# Patient Record
Sex: Female | Born: 1970 | Race: White | Hispanic: No | Smoking: Never smoker
Health system: Southern US, Community
[De-identification: ages and names within clinical notes are randomized; demographics above are authoritative.]

## PROBLEM LIST (undated history)

## (undated) DIAGNOSIS — F419 Anxiety disorder, unspecified: Secondary | ICD-10-CM

## (undated) DIAGNOSIS — F329 Major depressive disorder, single episode, unspecified: Secondary | ICD-10-CM

## (undated) DIAGNOSIS — T7840XA Allergy, unspecified, initial encounter: Secondary | ICD-10-CM

## (undated) DIAGNOSIS — E039 Hypothyroidism, unspecified: Secondary | ICD-10-CM

## (undated) DIAGNOSIS — Z5189 Encounter for other specified aftercare: Secondary | ICD-10-CM

## (undated) DIAGNOSIS — G4730 Sleep apnea, unspecified: Secondary | ICD-10-CM

## (undated) DIAGNOSIS — K2970 Gastritis, unspecified, without bleeding: Secondary | ICD-10-CM

## (undated) DIAGNOSIS — E782 Mixed hyperlipidemia: Secondary | ICD-10-CM

## (undated) DIAGNOSIS — M1990 Unspecified osteoarthritis, unspecified site: Secondary | ICD-10-CM

## (undated) DIAGNOSIS — Z87442 Personal history of urinary calculi: Secondary | ICD-10-CM

## (undated) DIAGNOSIS — D649 Anemia, unspecified: Secondary | ICD-10-CM

## (undated) DIAGNOSIS — G709 Myoneural disorder, unspecified: Secondary | ICD-10-CM

## (undated) DIAGNOSIS — G894 Chronic pain syndrome: Secondary | ICD-10-CM

## (undated) DIAGNOSIS — D759 Disease of blood and blood-forming organs, unspecified: Secondary | ICD-10-CM

## (undated) DIAGNOSIS — K219 Gastro-esophageal reflux disease without esophagitis: Secondary | ICD-10-CM

## (undated) DIAGNOSIS — J189 Pneumonia, unspecified organism: Secondary | ICD-10-CM

## (undated) DIAGNOSIS — K7460 Unspecified cirrhosis of liver: Secondary | ICD-10-CM

## (undated) DIAGNOSIS — Z1501 Genetic susceptibility to malignant neoplasm of breast: Secondary | ICD-10-CM

## (undated) DIAGNOSIS — M810 Age-related osteoporosis without current pathological fracture: Secondary | ICD-10-CM

## (undated) DIAGNOSIS — T8859XA Other complications of anesthesia, initial encounter: Secondary | ICD-10-CM

## (undated) DIAGNOSIS — Z1502 Genetic susceptibility to malignant neoplasm of ovary: Secondary | ICD-10-CM

## (undated) DIAGNOSIS — Z1509 Genetic susceptibility to other malignant neoplasm: Secondary | ICD-10-CM

## (undated) DIAGNOSIS — F32A Depression, unspecified: Secondary | ICD-10-CM

## (undated) DIAGNOSIS — D6949 Other primary thrombocytopenia: Secondary | ICD-10-CM

## (undated) DIAGNOSIS — C50311 Malignant neoplasm of lower-inner quadrant of right female breast: Secondary | ICD-10-CM

## (undated) DIAGNOSIS — C50111 Malignant neoplasm of central portion of right female breast: Secondary | ICD-10-CM

## (undated) DIAGNOSIS — H269 Unspecified cataract: Secondary | ICD-10-CM

## (undated) DIAGNOSIS — I10 Essential (primary) hypertension: Secondary | ICD-10-CM

## (undated) DIAGNOSIS — Z15060 Genetic susceptibility to colorectal cancer: Secondary | ICD-10-CM

## (undated) DIAGNOSIS — E119 Type 2 diabetes mellitus without complications: Secondary | ICD-10-CM

## (undated) HISTORY — DX: Genetic susceptibility to malignant neoplasm of breast: Z15.01

## (undated) HISTORY — DX: Gastritis, unspecified, without bleeding: K29.70

## (undated) HISTORY — DX: Anemia, unspecified: D64.9

## (undated) HISTORY — PX: OTHER SURGICAL HISTORY: SHX169

## (undated) HISTORY — PX: BREAST SURGERY: SHX581

## (undated) HISTORY — PX: SPINE SURGERY: SHX786

## (undated) HISTORY — DX: Malignant neoplasm of central portion of right female breast: C50.111

## (undated) HISTORY — DX: Genetic susceptibility to malignant neoplasm of breast: Z15.09

## (undated) HISTORY — DX: Other primary thrombocytopenia: D69.49

## (undated) HISTORY — DX: Genetic susceptibility to malignant neoplasm of ovary: Z15.02

## (undated) HISTORY — DX: Mixed hyperlipidemia: E78.2

## (undated) HISTORY — DX: Age-related osteoporosis without current pathological fracture: M81.0

## (undated) HISTORY — DX: Chronic pain syndrome: G89.4

## (undated) HISTORY — PX: BACK SURGERY: SHX140

## (undated) HISTORY — PX: CHOLECYSTECTOMY: SHX55

## (undated) HISTORY — DX: Unspecified cataract: H26.9

## (undated) HISTORY — DX: Malignant neoplasm of lower-inner quadrant of right female breast: C50.311

## (undated) HISTORY — DX: Encounter for other specified aftercare: Z51.89

## (undated) HISTORY — DX: Genetic susceptibility to colorectal cancer: Z15.060

## (undated) HISTORY — PX: APPENDECTOMY: SHX54

## (undated) HISTORY — DX: Allergy, unspecified, initial encounter: T78.40XA

## (undated) MED FILL — Paclitaxel IV Conc 300 MG/50ML (6 MG/ML): INTRAVENOUS | Qty: 22 | Status: AC

## (undated) MED FILL — Fosaprepitant Dimeglumine For IV Infusion 150 MG (Base Eq): INTRAVENOUS | Qty: 5 | Status: AC

---

## 1998-01-13 ENCOUNTER — Ambulatory Visit (HOSPITAL_COMMUNITY): Admission: RE | Admit: 1998-01-13 | Discharge: 1998-01-13 | Payer: Self-pay | Admitting: Unknown Physician Specialty

## 1998-06-04 ENCOUNTER — Other Ambulatory Visit: Admission: RE | Admit: 1998-06-04 | Discharge: 1998-06-04 | Payer: Self-pay | Admitting: *Deleted

## 1999-05-13 ENCOUNTER — Other Ambulatory Visit: Admission: RE | Admit: 1999-05-13 | Discharge: 1999-05-13 | Payer: Self-pay | Admitting: *Deleted

## 1999-06-10 ENCOUNTER — Encounter: Payer: Self-pay | Admitting: Family Medicine

## 1999-06-10 ENCOUNTER — Ambulatory Visit (HOSPITAL_COMMUNITY): Admission: RE | Admit: 1999-06-10 | Discharge: 1999-06-10 | Payer: Self-pay | Admitting: Family Medicine

## 2000-11-27 ENCOUNTER — Other Ambulatory Visit: Admission: RE | Admit: 2000-11-27 | Discharge: 2000-11-27 | Payer: Self-pay | Admitting: Internal Medicine

## 2004-03-05 ENCOUNTER — Encounter: Admission: RE | Admit: 2004-03-05 | Discharge: 2004-03-05 | Payer: Self-pay | Admitting: Neurological Surgery

## 2004-04-07 ENCOUNTER — Ambulatory Visit (HOSPITAL_COMMUNITY): Admission: RE | Admit: 2004-04-07 | Discharge: 2004-04-08 | Payer: Self-pay | Admitting: Neurological Surgery

## 2004-04-21 ENCOUNTER — Inpatient Hospital Stay (HOSPITAL_COMMUNITY): Admission: RE | Admit: 2004-04-21 | Discharge: 2004-04-24 | Payer: Self-pay | Admitting: Neurological Surgery

## 2004-10-18 ENCOUNTER — Encounter: Admission: RE | Admit: 2004-10-18 | Discharge: 2004-10-18 | Payer: Self-pay | Admitting: Neurological Surgery

## 2005-06-16 ENCOUNTER — Encounter: Admission: RE | Admit: 2005-06-16 | Discharge: 2005-06-16 | Payer: Self-pay | Admitting: Neurological Surgery

## 2006-03-07 ENCOUNTER — Emergency Department (HOSPITAL_COMMUNITY): Admission: EM | Admit: 2006-03-07 | Discharge: 2006-03-07 | Payer: Self-pay | Admitting: Emergency Medicine

## 2006-03-07 ENCOUNTER — Ambulatory Visit: Payer: Self-pay | Admitting: *Deleted

## 2006-06-20 ENCOUNTER — Emergency Department (HOSPITAL_COMMUNITY): Admission: EM | Admit: 2006-06-20 | Discharge: 2006-06-20 | Payer: Self-pay | Admitting: Emergency Medicine

## 2007-08-09 IMAGING — CT CT L SPINE W/O CM
3 series · 15 of 33 positions shown, 18 images · IV contrast (agent unspecified)
Comparison: 03/05/04.

Addendum Begins
Please disregard this addendum.  Original report is unchanged.

Addendum Ends
CLINICAL DATA: Neck and back pain.  
LUMBAR SPINE CT WITHOUT CONTRAST:
TECHNIQUE: Multidetector CT imaging of the lumbar spine was performed.  Multiplanar CT image reconstructions were also generated.

[Series 4: recon 3: l-spine helical · axial · 0.27mm/px · z∈[-75,+72]mm · 7 of 139 slices shown, 9 images]
[im 11/139  soft-tissue]
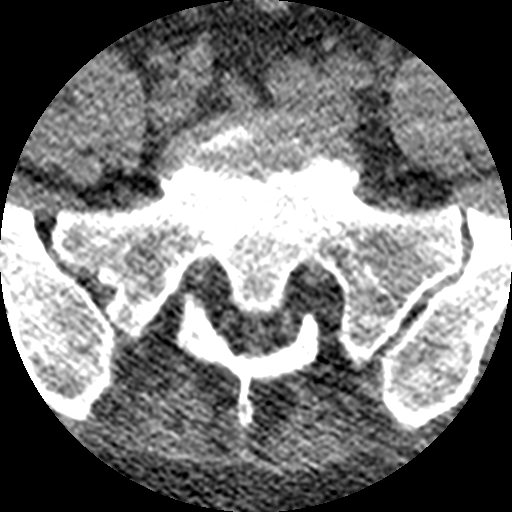
[im 11/139  bone]
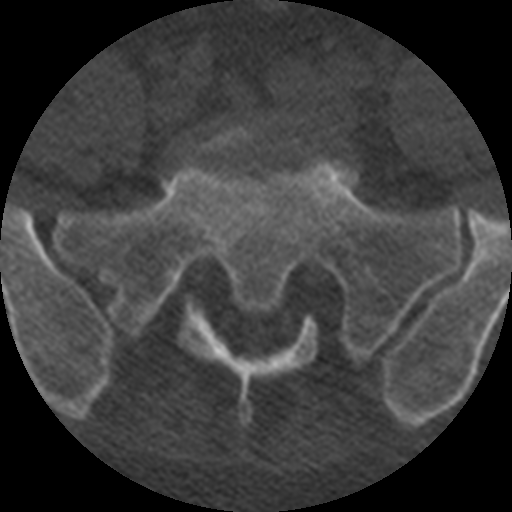
[im 32/139  bone]
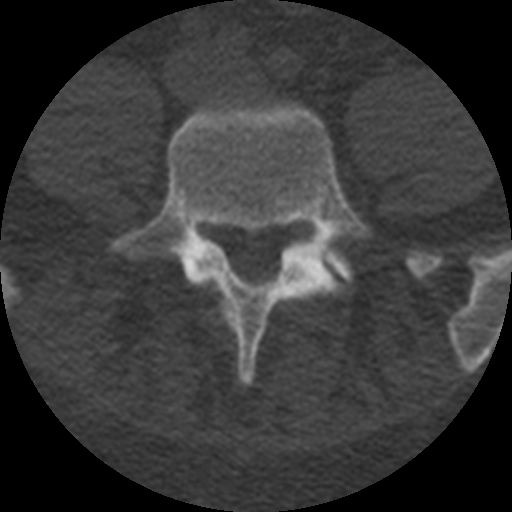
[im 54/139  bone]
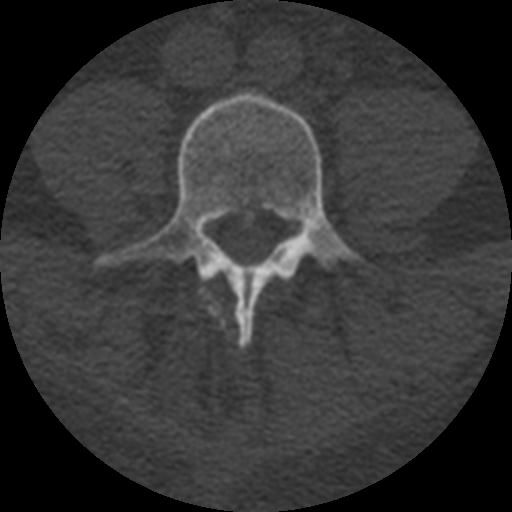
[im 75/139  bone]
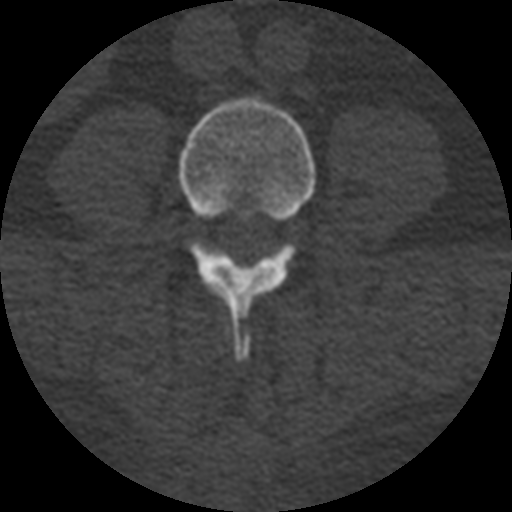
[im 85/139  soft-tissue]
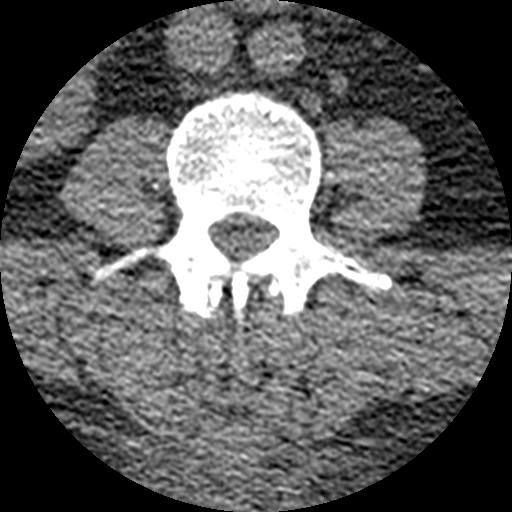
[im 85/139  bone]
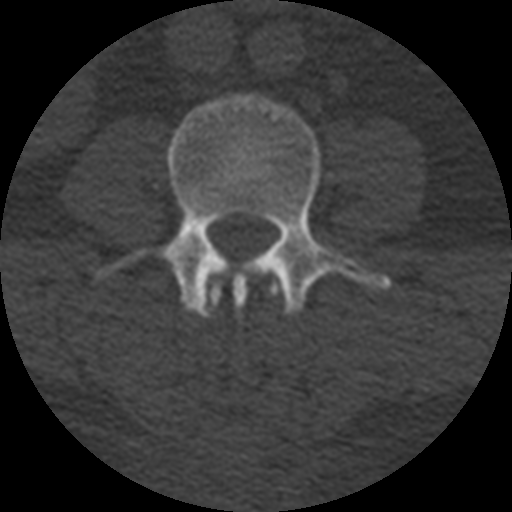
[im 107/139  bone]
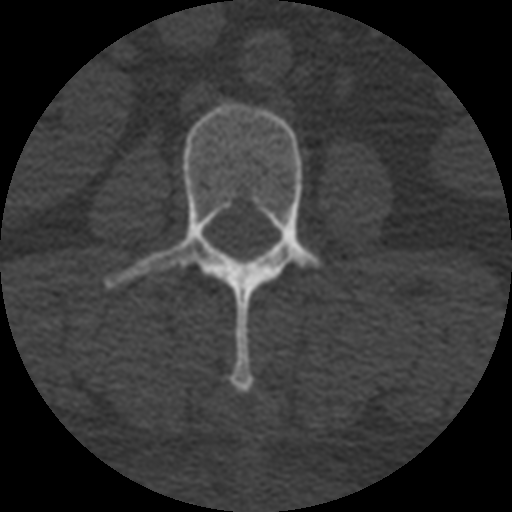
[im 128/139  bone]
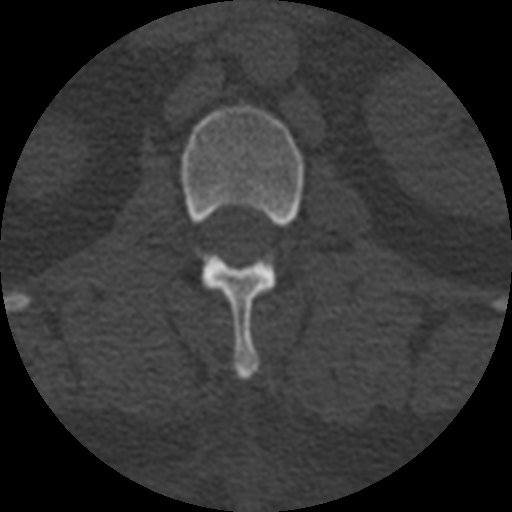

[Series 400: reformatted · sagittal · 0.35mm/px · 5 of 40 slices shown, 6 images (1 of 2)]
[im 14/40  bone]
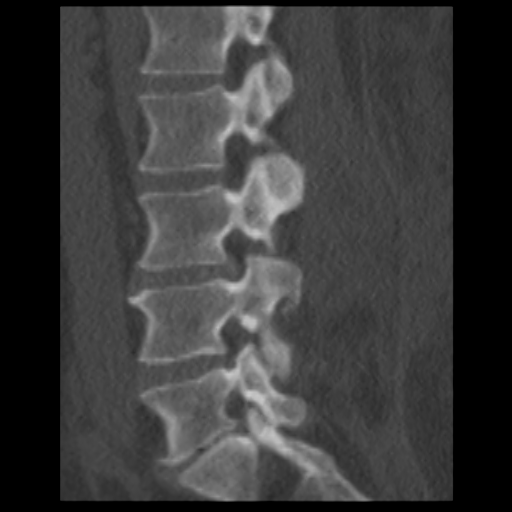
[im 17/40  bone]
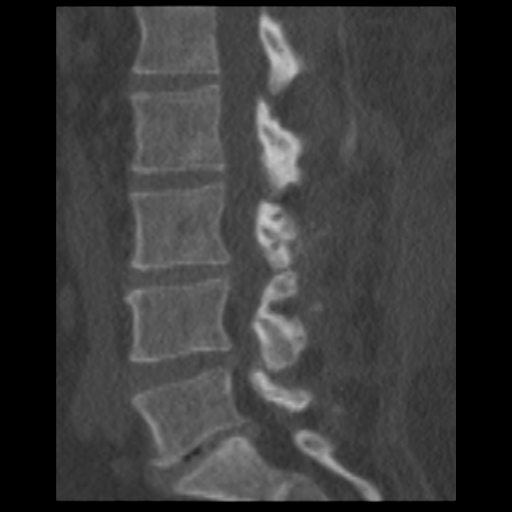
[im 20/40  soft-tissue]
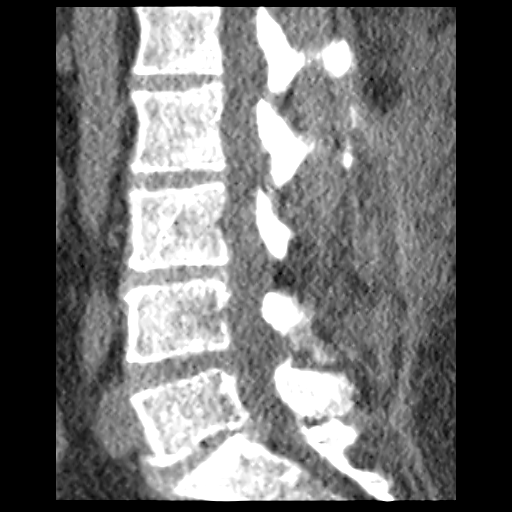
[im 20/40  bone]
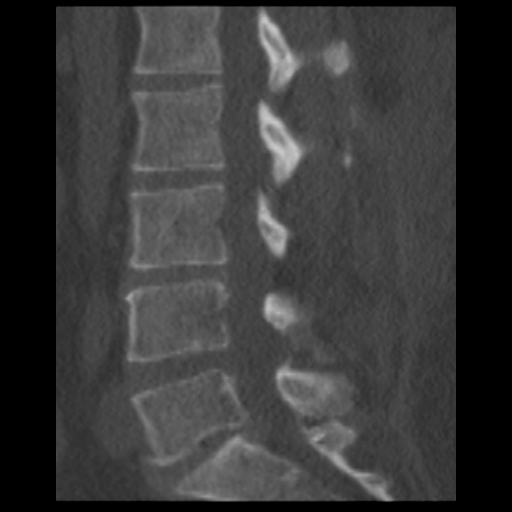
[im 23/40  bone]
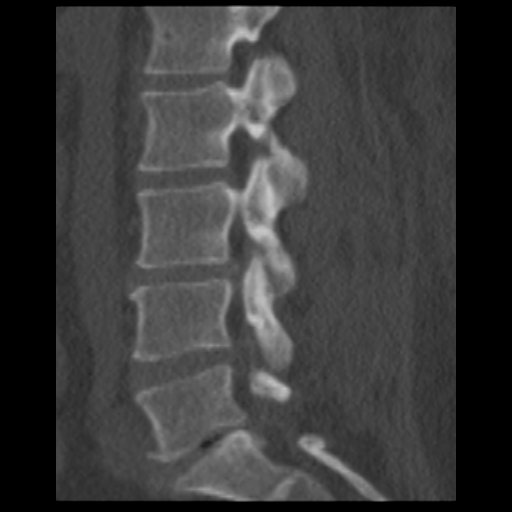
[im 27/40  bone]
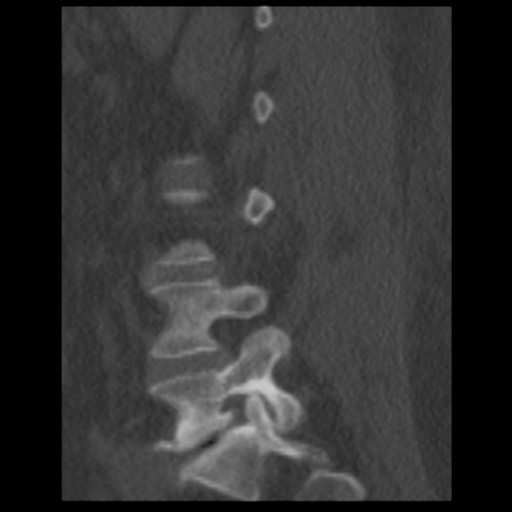

[Series 401: reformatted · coronal · 0.35mm/px · 3 of 40 slices shown (2 of 2)]
[im 8/40  bone]
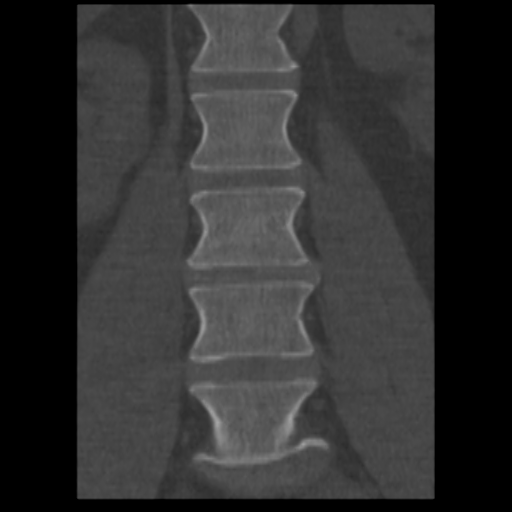
[im 16/40  bone]
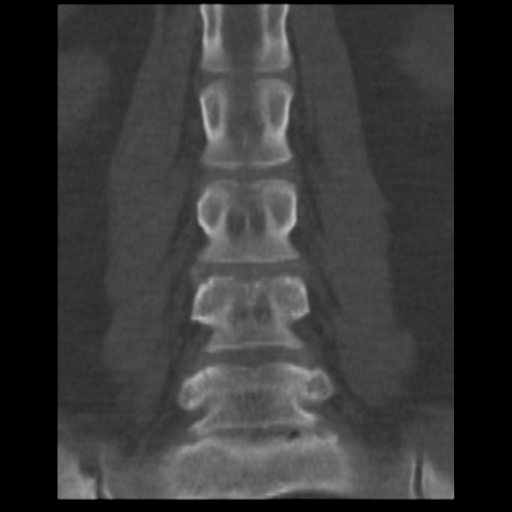
[im 24/40  bone]
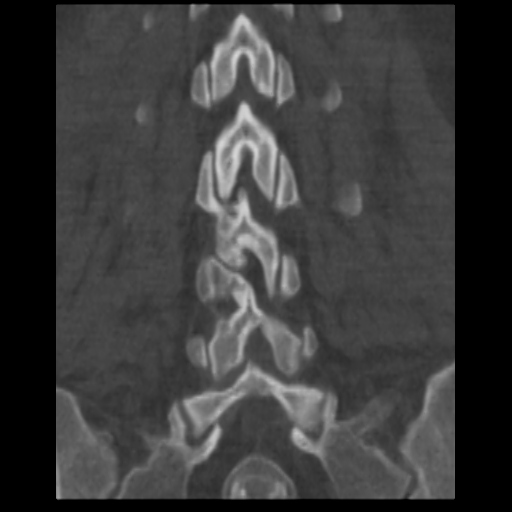

[15 of 33 positions shown; findings below may reference images not displayed]

FINDINGS: The patient has had left laminectomy at the L-3, L-4, and L-5 levels.  The facet joint on the right at L3-4 has an abnormal horizontal alignment, consistent with pars defect.  There is adjacent bony prominency likely from prior fusion.  
t L3-4 there was previously a broad based disc bulge.  On today?s exam there is a vague suggestion of a disc bulge best seen in the region of the neural foramina.  I do not believe that this is causing significant stenosis although it could possibly abut the right L-3 nerve in the lateral extraforaminal space. 
L4-5:  There is a suggestion of an extradural desity likely from epidural fibrosis given the history of intradiscal diskectomy.  The tissue contrast between the disc and the thecal sac is not sufficient to be highly specific.  There is a suggestion of subarticular lateral recess stenosis bilaterally.
L5-S1:  There appears to be soft tissue density posterior to the intervertebral level.  Given the history of intradiscal diskectomy, this may simply represent epidural fibrosis.  Correlate with expected degree of totality of diskectomy in assessing whether this could represent recurrent disk material. There is borderline left foraminal stenosis due to foraminal spurring extending from the end plate of L-5.  Vacuum disc phenomenon is present at this level.  No subluxation is identified.
IMPRESSION: Tissue contrast between the thecal sac and the intervertebral discs is poor.  However, there is a sense of a extradural soft tissue density at the L5-S1 level similar to that noted previously, although given the interval interdiscal diskectomy this may simply represent epidural fibrosis.  There is probably subarticular lateral recess stenosis at L4-5, and borderline left foramenal narrowing at L5-S1.

## 2010-02-14 ENCOUNTER — Encounter: Payer: Self-pay | Admitting: Neurological Surgery

## 2010-06-11 NOTE — Discharge Summary (Signed)
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   PROCEDURES:  Decompressive lumbar laminectomy L3-L4, L4-L5 and L5-S1 with  non instrumented onlay fusion.   BRIEF HISTORY OF PRESENT ILLNESS:  Ms. Gholson is a 40 year old white female  who presented to the neurosurgery clinic with complaints of back pain with  bilateral leg pain.  She had undergone a couple of previous surgeries  performed by another physician.  She had an MRI which showed significant  spondylosis with stenosis at L3-4, L4-L5 and L5-S1.  I recommended a repeat  decompressive laminectomy and diskectomy followed by non-instrumented  fusion.  She understood the risks, benefits and expected outcome of the  procedure and wished to proceed.   HOSPITAL COURSE:  The patient was admitted on April 21, 2004 and taken to  the operating room where she underwent a decompressive laminectomy at L3-4,  L4-L5 and L5-S1 followed by non-instrumented onlay fusion.  The patient  tolerated the procedure well, was taken to the recovery room and admitted to  the floor in stable condition.  For details of the operative procedure,  please see the dictated operative note.  The patient's hospital course was  routine.  There were no complications.  She did have appropriate incisional  soreness after the surgery.  Her leg pain was better.  She had no  significant left leg pain or numbness, tingling or weakness.  She was  ambulating well.  She had some postoperative urinary retention and had a  Foley catheter placed.  It was removed on April 1 and she  continued to be  able to urinate well on her own.  Her pain was well controlled.  She was  ambulating with a walker.  Her incision was clean, dry and intact.  She  continued to do well and was discharged to home in stable condition on April 24, 2004 with plans to follow with Dr. Ronnald Ramp in 2 weeks.   DISCHARGE DIAGNOSES:  Lumbar spondylosis with stenosis status post  decompressive laminectomy and non-instrumented onlay fusion.      DSJ/MEDQ  D:  06/11/2004  T:  06/11/2004  Job:  [id number]

## 2010-06-11 NOTE — Op Note (Signed)
Linda [name], Linda [name]                 ACCOUNT NO.:  [id number]   MEDICAL RECORD NO.:  [id number]          PATIENT TYPE:  INP   LOCATION:  2861                         FACILITY:  [facility]   PHYSICIAN:  Eustace Moore, MD     DATE OF BIRTH:  May 31, 1970   DATE OF PROCEDURE:  04/21/2004  DATE OF DISCHARGE:                                 OPERATIVE REPORT   PREOPERATIVE DIAGNOSIS:  Lumbar spondylosis, L3 to L5 with degenerative disk  disease, L3-4, L4-5, L5-S1 with lumbar disk herniation, L4-5, L5-S1 with  back and left leg pain and failed back syndrome.   POSTOPERATIVE DIAGNOSIS:  Lumbar spondylosis, L3 to L5 with degenerative  disk disease, L3-4, L4-5, L5-S1 with lumbar disk herniation, L4-5, L5-S1  with back and left leg pain and failed back syndrome.   OPERATION PERFORMED:  1.  Redo hemilaminectomy, medial facetectomy and foraminotomy at L3-4 on the      left and at L5-S1 on the left with a redo diskectomy at L5-S1 on the      left side utilizing microscopic dissection.  We also did a      hemilaminectomy and medial facetectomy with foraminotomy at L4-5 on the      left with microdiskectomy at L4-5 on the left side.  A sublaminar was      performed at L4-5 also.  2.  Harvesting of iliac crest bone marrow aspirate through a separate      fascial incision.  3.  Posterolateral arthrodesis, L3 to S1 on the right utilizing locally      harvested, morcellized autologous bone graft and Isotis soaked with bone      marrow aspirate.  4.  Microdissection.   SURGEON:  Eustace Moore, MD   ASSISTANT:  Kary Kos, M.D.   ANESTHESIA:  General endotracheal.   COMPLICATIONS:  None apparent.   INDICATIONS FOR PROCEDURE:  [name] is a 40 year old white female who had  undergone two previous back surgeries, one at L3-4 and one at L5-S1 by  another physician.  She had chronic back pain with left leg pain and right  buttocks pain following the surgery.  She had tried medical management for  quite  some time without significant relief. She had an MRI and a CT  myelogram which showed continued spondylosis at L3-4, L4-5 and L5-S1 with  degenerative disk disease at these levels and recurrent disk herniations at  L5-S1 and L3-4.  I recommended a lumbar re-exploration with repeat  decompression with noninstrumented fusion.  We wanted to avoid a large three  level posterior lumbar interbody fusion procedure because of her youth.  I  thought this would leave her with significant back  pain and a very long  recovery time.  She understood the risks, benefits and expected outcome of  this procedure and wished to proceed.   DESCRIPTION OF PROCEDURE:  The patient was taken to the operating room and  after induction of adequate general endotracheal anesthesia, was rolled into  the prone position over the Wilson frame and all pressure points were  padded.  The  lumbar region was prepped with DuraPrep and then draped in the  usual sterile fashion.  10 mL of local anesthesia was injected and then her  old incisions were ellipsed out.  I then took the incision down to the  lumbosacral fascia.  This was opened and the paraspinous musculature was  taken down in a subperiosteal fashion to expose L3 to S1. Intraoperative x-  ray confirmed our level and then I used the Kerrison punch to performed a  repeat hemilaminectomy, medial facetectomy and foraminotomy at L5-S1 on the  left side.  There was significant scar tissue around the left S1 nerve root.  I teased this away and decompressed the nerve root and detethered it from  the disk space itself.  I then went to the L4-5 level and performed a  hemilaminectomy and medial facetectomy and foraminotomy at L4-5 on the left.  I removed the yellow ligament, identified the underlying L5 nerve root.  I  decompressed those out to the medial pedicle wall and decompressed the  lateral recess.  I then drilled up under the spinous process and performed a  sublaminar  decompression on this side, decompressing the lateral recess on  the patient's right side.  I then went to L3-4 and performed a redo  hemilaminectomy, medial facetectomy and foraminotomy at L3-4 on the left  side.  I then inspected the nerve roots once again, and made sure they were  well decompressed.  We then went to the right iliac crest where I made a  separate fascial incision and passed a large Healos needle into the iliac  crest and removed about 15 mL of bone marrow aspirate.  I then waxed the  hole and closed the separate fascial incision with interrupted 31 Vicryl.  We then soaked the Isotis allograft with the bone marrow aspirate and mixed  this with our locally harvested morcellized autologous bone graft.  We then  brought in the operating microscope and retracted the dura medially at L3-4  and inspected the disk.  We did not feel this was a significant disk  herniation and therefore decided not to incise this disk and perform a  diskectomy.  At L4-5 and at L5-S1, though, we felt that the disk herniations  were more significant.  We incised each disk at those two levels and  performed thorough intradiskal diskectomies with pituitary rongeurs at these  levels.  Once decompression was complete, we palpated with a nerve hook and  a coronary dilator to assure adequate decompression in the  midline and the  nerve root.  We then irrigated with saline solution containing bacitracin.  We then decorticated the L3, L4, L5 and S1 lamina and placed a mixture of  the Isotis with bone marrow aspirate and local autograft out over this for  posterolateral arthrodesis at L3 to S1 on the right side inclusive.  We then  placed a medium Hemovac drain through a separate stab incision, dried all  bleeding points and then closed the fascia with interrupted #1 Vicryl,  closed the subcutaneous and subcuticular tissues with 2-0 and 3-0 Vicryl and closed the skin with benzoin and Steri-Strips.  The drapes  were removed.  A  sterile dressing was applied.  The patient was awakened from general  anesthesia and transported to the recovery room in stable condition.  At the  end of the procedure, all sponge, needle and instrument counts were correct.      DSJ/MEDQ  D:  04/21/2004  T:  04/21/2004  Job:  [id number]

## 2010-11-05 ENCOUNTER — Institutional Professional Consult (permissible substitution): Payer: Self-pay | Admitting: Pulmonary Disease

## 2010-12-03 ENCOUNTER — Institutional Professional Consult (permissible substitution): Payer: Self-pay | Admitting: Pulmonary Disease

## 2011-01-07 ENCOUNTER — Institutional Professional Consult (permissible substitution): Payer: Self-pay | Admitting: Pulmonary Disease

## 2011-02-04 ENCOUNTER — Institutional Professional Consult (permissible substitution): Payer: Self-pay | Admitting: Pulmonary Disease

## 2011-06-10 ENCOUNTER — Institutional Professional Consult (permissible substitution): Payer: Self-pay | Admitting: Pulmonary Disease

## 2011-07-08 ENCOUNTER — Institutional Professional Consult (permissible substitution): Payer: Self-pay | Admitting: Pulmonary Disease

## 2016-02-19 DIAGNOSIS — K219 Gastro-esophageal reflux disease without esophagitis: Secondary | ICD-10-CM | POA: Diagnosis not present

## 2016-02-19 DIAGNOSIS — F321 Major depressive disorder, single episode, moderate: Secondary | ICD-10-CM | POA: Diagnosis not present

## 2016-02-19 DIAGNOSIS — E1142 Type 2 diabetes mellitus with diabetic polyneuropathy: Secondary | ICD-10-CM | POA: Diagnosis not present

## 2016-02-19 DIAGNOSIS — E559 Vitamin D deficiency, unspecified: Secondary | ICD-10-CM | POA: Diagnosis not present

## 2016-02-19 DIAGNOSIS — M545 Low back pain: Secondary | ICD-10-CM | POA: Diagnosis not present

## 2016-02-19 DIAGNOSIS — E1121 Type 2 diabetes mellitus with diabetic nephropathy: Secondary | ICD-10-CM | POA: Diagnosis not present

## 2016-02-19 DIAGNOSIS — I10 Essential (primary) hypertension: Secondary | ICD-10-CM | POA: Diagnosis not present

## 2016-02-19 DIAGNOSIS — D649 Anemia, unspecified: Secondary | ICD-10-CM | POA: Diagnosis not present

## 2016-02-19 DIAGNOSIS — E782 Mixed hyperlipidemia: Secondary | ICD-10-CM | POA: Diagnosis not present

## 2016-03-04 DIAGNOSIS — D649 Anemia, unspecified: Secondary | ICD-10-CM | POA: Diagnosis not present

## 2016-03-23 DIAGNOSIS — Z803 Family history of malignant neoplasm of breast: Secondary | ICD-10-CM | POA: Diagnosis not present

## 2016-03-23 DIAGNOSIS — Z8051 Family history of malignant neoplasm of kidney: Secondary | ICD-10-CM | POA: Diagnosis not present

## 2016-03-23 DIAGNOSIS — D696 Thrombocytopenia, unspecified: Secondary | ICD-10-CM | POA: Diagnosis not present

## 2016-03-23 DIAGNOSIS — D509 Iron deficiency anemia, unspecified: Secondary | ICD-10-CM | POA: Diagnosis not present

## 2016-04-20 DIAGNOSIS — D649 Anemia, unspecified: Secondary | ICD-10-CM | POA: Diagnosis not present

## 2016-05-11 DIAGNOSIS — D649 Anemia, unspecified: Secondary | ICD-10-CM | POA: Diagnosis not present

## 2016-05-17 ENCOUNTER — Telehealth: Payer: Self-pay | Admitting: *Deleted

## 2016-05-17 NOTE — Telephone Encounter (Signed)
I have called and left a message on the machine for the patient to call the office back. Patient needs a new paitent appt.

## 2016-05-18 ENCOUNTER — Telehealth: Payer: Self-pay | Admitting: *Deleted

## 2016-05-18 NOTE — Telephone Encounter (Signed)
I have called and spoke with the patient. I have given the patient the date/time of May 18th at 8:30am, to arrive at 8:15am.

## 2016-05-24 DIAGNOSIS — D500 Iron deficiency anemia secondary to blood loss (chronic): Secondary | ICD-10-CM | POA: Diagnosis not present

## 2016-05-27 DIAGNOSIS — D252 Subserosal leiomyoma of uterus: Secondary | ICD-10-CM | POA: Diagnosis not present

## 2016-05-27 DIAGNOSIS — Z1502 Genetic susceptibility to malignant neoplasm of ovary: Secondary | ICD-10-CM | POA: Diagnosis not present

## 2016-05-27 DIAGNOSIS — Z1501 Genetic susceptibility to malignant neoplasm of breast: Secondary | ICD-10-CM | POA: Diagnosis not present

## 2016-05-27 DIAGNOSIS — Z1231 Encounter for screening mammogram for malignant neoplasm of breast: Secondary | ICD-10-CM | POA: Diagnosis not present

## 2016-05-31 DIAGNOSIS — D500 Iron deficiency anemia secondary to blood loss (chronic): Secondary | ICD-10-CM | POA: Diagnosis not present

## 2016-05-31 DIAGNOSIS — Z1501 Genetic susceptibility to malignant neoplasm of breast: Secondary | ICD-10-CM | POA: Diagnosis not present

## 2016-05-31 DIAGNOSIS — Z1502 Genetic susceptibility to malignant neoplasm of ovary: Secondary | ICD-10-CM | POA: Diagnosis not present

## 2016-06-02 DIAGNOSIS — C50919 Malignant neoplasm of unspecified site of unspecified female breast: Secondary | ICD-10-CM | POA: Diagnosis not present

## 2016-06-02 DIAGNOSIS — Z1501 Genetic susceptibility to malignant neoplasm of breast: Secondary | ICD-10-CM | POA: Diagnosis not present

## 2016-06-06 ENCOUNTER — Other Ambulatory Visit: Payer: Self-pay | Admitting: Surgery

## 2016-06-06 DIAGNOSIS — Z1501 Genetic susceptibility to malignant neoplasm of breast: Secondary | ICD-10-CM

## 2016-06-06 DIAGNOSIS — Z1509 Genetic susceptibility to other malignant neoplasm: Principal | ICD-10-CM

## 2016-06-08 DIAGNOSIS — K2961 Other gastritis with bleeding: Secondary | ICD-10-CM | POA: Diagnosis not present

## 2016-06-08 DIAGNOSIS — D500 Iron deficiency anemia secondary to blood loss (chronic): Secondary | ICD-10-CM | POA: Diagnosis not present

## 2016-06-08 DIAGNOSIS — K3189 Other diseases of stomach and duodenum: Secondary | ICD-10-CM | POA: Diagnosis not present

## 2016-06-08 DIAGNOSIS — K219 Gastro-esophageal reflux disease without esophagitis: Secondary | ICD-10-CM | POA: Diagnosis not present

## 2016-06-08 LAB — HM COLONOSCOPY

## 2016-06-10 ENCOUNTER — Ambulatory Visit: Payer: PPO | Attending: Gynecology | Admitting: Gynecology

## 2016-06-10 ENCOUNTER — Inpatient Hospital Stay (HOSPITAL_COMMUNITY)
Admission: RE | Admit: 2016-06-10 | Discharge: 2016-06-10 | Disposition: A | Discharge status: Home / Self Care | Payer: PPO | Source: Ambulatory Visit

## 2016-06-10 ENCOUNTER — Encounter: Payer: Self-pay | Admitting: Gynecology

## 2016-06-10 VITALS — BP 161/85 | HR 92 | Temp 98.2°F | Resp 20 | Ht 63.15 in | Wt 232.8 lb

## 2016-06-10 DIAGNOSIS — E119 Type 2 diabetes mellitus without complications: Secondary | ICD-10-CM | POA: Diagnosis not present

## 2016-06-10 DIAGNOSIS — Z1502 Genetic susceptibility to malignant neoplasm of ovary: Secondary | ICD-10-CM | POA: Diagnosis not present

## 2016-06-10 DIAGNOSIS — E669 Obesity, unspecified: Secondary | ICD-10-CM | POA: Diagnosis not present

## 2016-06-10 DIAGNOSIS — Z803 Family history of malignant neoplasm of breast: Secondary | ICD-10-CM | POA: Diagnosis not present

## 2016-06-10 DIAGNOSIS — Z1501 Genetic susceptibility to malignant neoplasm of breast: Secondary | ICD-10-CM | POA: Diagnosis not present

## 2016-06-10 DIAGNOSIS — Z1509 Genetic susceptibility to other malignant neoplasm: Secondary | ICD-10-CM | POA: Insufficient documentation

## 2016-06-10 NOTE — Progress Notes (Signed)
Consult Note: Gyn-Onc   Linda [name] 46 y.o. female  Chief Complaint  Patient presents with  . BRCA 1 Positive    Assessment :46-year-old white single female gravida 0 with a BRCA 1 mutation desiring risk reducing hysterectomy and bilateral salpingo-oophorectomy.  Plan: I had a good discussion with the patient and her stepmother regarding the implications of the BRCA 1 mutation. They're well aware that she is at significantly increased risk for both breast and ovarian cancer. They understand that time for screening test for ovarian cancer and therefore we would recommend salpingo-oophorectomy. Further, given the BRCA1 mutation she is also at high risk to develop papillary serous carcinoma of the endometrium. The patient wishes to proceed with laparoscopic hysterectomy and bilateral salpingo-oophorectomy. She understands the risks of surgery including hemorrhage, infection, injury to adjacent viscera, anesthetic risks, and thrombolic complications. She has other medical problems such as obesity and type 2 diabetes which may also increase the risk of surgery. Considering all of this patient does wish to go ahead with surgery as soon as possible and we will schedule it for next Tuesday. Patient understands and Dr. Paula Gehrig will be her surgeon and will meet Dr. Gehrig that morning. The postoperative recovery course was also reviewed with the patient and her stepmother. All other questions are answered.  Patient has not had a Pap smear nor years of Paps or is performed today.     HPI: 46-year-old white single female seen in consultation the request of Dr. Kelly Mosher who, because of a family history of breast cancer, has been tested for genetic mutations and was found to have a BRCA1 mutation. The patient denies any past gynecologic history. She went through menopause at age 39. She's not been on any hormone replacement therapy. She's never been pregnant. She has not had a Pap smear in separate  years. She denies any gynecologic symptoms including pelvic pain or pressure. She has no abdominal symptoms such as early satiety, bloating, distention, or pressure. Patient indicates that she has had a ultrasound of the pelvis which was normal although those records are not available.  Patient has a number of medical problems noted below. She has had an appendectomy and laparoscopic  cholecystectomy. Review of Systems:10 point review of systems is negative except as noted in interval history.   Vitals: Blood pressure (!) 161/85, pulse 92, temperature 98.2 F (36.8 C), temperature source Oral, resp. rate 20, height 5' 3.15" (1.604 m), weight 232 lb 12.8 oz (105.6 kg).  Physical Exam: General : The patient is a healthy woman in no acute distress.  HEENT: normocephalic, extraoccular movements normal; neck is supple without thyromegally  Lynphnodes: Supraclavicular and inguinal nodes not enlarged  Abdomen:  Obese,Soft, non-tender, no ascites, no organomegally, no masses, no hernias  Pelvic:  EGBUS: Normal female  Vagina: Normal, no lesions  Urethra and Bladder: Normal, non-tender  Cervix: appears nulliparous. Uterus: difficult to evaluate secondary to the patient's habitus.   Bi-manual examination: Non-tender; no adenxal masses or nodularity  Rectal: normal sphincter tone, no masses, no blood  Lower extremities: No edema or varicosities. Normal range of motion      Allergies  Allergen Reactions  . Celecoxib Other (See Comments)    ask  . Codeine Other (See Comments)    ask  . Ezetimibe-Simvastatin Other (See Comments)    ask  . Propranolol Hcl Other (See Comments)    ask    No past medical history on file.  No past surgical history on   file.  Current Outpatient Prescriptions  Medication Sig Dispense Refill  . pravastatin (PRAVACHOL) 20 MG tablet     . buPROPion (WELLBUTRIN XL) 300 MG 24 hr tablet Take 300 mg by mouth.    . clotrimazole-betamethasone (LOTRISONE) cream Apply  topically.    . Dulaglutide 1.5 MG/0.5ML SOPN Inject 1.5 mg into the skin.    . famotidine (PEPCID) 20 MG tablet Take 20 mg by mouth.    . FETZIMA 80 MG CP24 Take 1 capsule by mouth daily.  0  . gabapentin (NEURONTIN) 300 MG capsule Take 300 mg by mouth.    . LANTUS SOLOSTAR 100 UNIT/ML Solostar Pen INJECT 68 UNITS ONCE DAILY  3  . levothyroxine (SYNTHROID, LEVOTHROID) 75 MCG tablet Take by mouth.    . lidocaine (XYLOCAINE) 2 % solution 15 mLs.    . lisinopril (PRINIVIL,ZESTRIL) 10 MG tablet Take 10 mg by mouth.    . losartan (COZAAR) 25 MG tablet Take 25 mg by mouth.    . metFORMIN (GLUCOPHAGE) 1000 MG tablet Take 1,000 mg by mouth.    . methocarbamol (ROBAXIN) 500 MG tablet Take 500 mg by mouth.    . morphine (KADIAN) 10 MG 24 hr capsule Take 10 mg by mouth.    . morphine (MSIR) 15 MG tablet TAKE ONE TABLET BY MOUTH AS NEEDED FOR SEVERE breakthrough pain  0  . pantoprazole (PROTONIX) 40 MG tablet Take 40 mg by mouth.    . potassium chloride (MICRO-K) 10 MEQ CR capsule Take by mouth.    . prochlorperazine (COMPAZINE) 10 MG tablet Take 10 mg by mouth.    . sucralfate (CARAFATE) 1 g tablet TAKE ONE TABLET BY MOUTH FOUR TIMES DAILY 30 MINUTES BEFORE MEALS AND AT BEDTIME  12  . Vitamin D, Ergocalciferol, (DRISDOL) 50000 units CAPS capsule Take by mouth.    . zolpidem (AMBIEN) 10 MG tablet Take 10 mg by mouth.     No current facility-administered medications for this visit.     Social History   Social History  . Marital status: Married    Spouse name: N/A  . Number of children: N/A  . Years of education: N/A   Occupational History  . Not on file.   Social History Main Topics  . Smoking status: Not on file  . Smokeless tobacco: Not on file  . Alcohol use Not on file  . Drug use: Unknown  . Sexual activity: Not on file   Other Topics Concern  . Not on file   Social History Narrative  . No narrative on file    No family history on file.    Linda Lazenby Clarke-Pearson,  MD 06/10/2016, 8:45 AM       

## 2016-06-10 NOTE — Patient Instructions (Addendum)
Linda [name]  06/10/2016   Your procedure is scheduled on: 06-14-16  Report to [facility]  elevators to 3rd floor to  [facility] at 845 AM.  Call this number if you have problems the morning of surgery [phone]   Remember: ONLY 1 PERSON MAY GO WITH YOU TO SHORT STAY TO GET  READY MORNING OF [place].  Do not eat food or drink liquids :After Midnight.LIGHT DIET DAY BEFORE SURGERY ON 06-13-16 SEE INSTRUCTIONS BELOW     Take these medicines the morning of surgery with A SIP OF WATER: BUPROPION (WELLBUTRION),NEURONTIN ( GABAPENTIN), LEVOTHYROXINE (SYNTHROID), MORPHINE (KADIAN) IF NEEDED, PANTAPRAZOLE (PROTONIX) DO NOT TAKE ANY DIABETIC MEDICATIONS DAY OF YOUR SURGERY                               You may not have any metal on your body including hair pins and              piercings  Do not wear jewelry, make-up, lotions, powders or perfumes, deodorant             Do not wear nail polish.  Do not shave  48 hours prior to surgery.              Do not bring valuables to the hospital. [place].  Contacts, dentures or bridgework may not be worn into surgery.  Leave suitcase in the car. After surgery it may be brought to your room.                  Please read over the following fact sheets you were given: _____________________________________________________________________             Eat a light diet the day before surgery.  Examples including soups, broths, toast, yogurt, mashed potatoes.  Things to avoid include carbonated beverages (fizzy beverages), raw fruits and raw vegetables, or beans.   If your bowels are filled with gas, your surgeon will have difficulty visualizing your pelvic organs which increases your surgical risks.How to Manage Your Diabetes Before and After Surgery  Why is it important to control my blood sugar before and after surgery? . Improving blood  sugar levels before and after surgery helps healing and can limit problems. . A way of improving blood sugar control is eating a healthy diet by: o  Eating less sugar and carbohydrates o  Increasing activity/exercise o  Talking with your doctor about reaching your blood sugar goals . High blood sugars (greater than 180 mg/dL) can raise your risk of infections and slow your recovery, so you will need to focus on controlling your diabetes during the weeks before surgery. . Make sure that the doctor who takes care of your diabetes knows about your planned surgery including the date and location.  How do I manage my blood sugar before surgery? . Check your blood sugar at least 4 times a day, starting 2 days before surgery, to make sure that the level is not too high or low. o Check your blood sugar the morning of your surgery when you wake up and every 2 hours until you get to the Short Stay unit. . If your blood sugar is  less than 70 mg/dL, you will need to treat for low blood sugar: o Do not take insulin. o Treat a low blood sugar (less than 70 mg/dL) with  cup of clear juice (cranberry or apple), 4 glucose tablets, OR glucose gel. o Recheck blood sugar in 15 minutes after treatment (to make sure it is greater than 70 mg/dL). If your blood sugar is not greater than 70 mg/dL on recheck, call [phone] for further instructions. . Report your blood sugar to the short stay nurse when you get to Short Stay.  . If you are admitted to the hospital after surgery: o Your blood sugar will be checked by the staff and you will probably be given insulin after surgery (instead of oral diabetes medicines) to make sure you have good blood sugar levels. o The goal for blood sugar control after surgery is 80-180 mg/dL.   WHAT DO I DO ABOUT MY DIABETES MEDICATION?  . TAKE YOUR MORNING DOSE OF LANTUS INSULIN AS USUAL DAY BEFORE SURGERY ON 06-13-16 . The day of surgery , TAKE 1/2 OF YOUR NORMAL DOSE OF MORNING  LANTUS INSULIN  THE DAY BEFORE SURGERY ON 06-13-16 TAKE YOUR METFORMIN [name] USUAL THE DAY OF SURGERY ON 06-14-16 DO NOT TAKE YOUR METFORMIN  Patient Signature:  Date:   Nurse Signature:  Date:   Reviewed and Endorsed by [place] Patient Education Committee, August 2015[facility] - Preparing for Surgery Before surgery, you can play an important role.  Because skin is not sterile, your skin needs to be as free of germs as possible.  You can reduce the number of germs on your skin by washing with CHG (chlorahexidine gluconate) soap before surgery.  CHG is an antiseptic cleaner which kills germs and bonds with the skin to continue killing germs even after washing. Please DO NOT use if you have an allergy to CHG or antibacterial soaps.  If your skin becomes reddened/irritated stop using the CHG and inform your nurse when you arrive at Short Stay. Do not shave (including legs and underarms) for at least 48 hours prior to the first CHG shower.  You may shave your face/neck. Please follow these instructions carefully:  1.  Shower with CHG Soap the night before surgery and the  morning of Surgery.  2.  If you choose to wash your hair, wash your hair first as usual with your  normal  shampoo.  3.  After you shampoo, rinse your hair and body thoroughly to remove the  shampoo.                           4.  Use CHG as you would any other liquid soap.  You can apply chg directly  to the skin and wash                       Gently with a scrungie or clean washcloth.  5.  Apply the CHG Soap to your body ONLY FROM THE NECK DOWN.   Do not use on face/ open                           Wound or open sores. Avoid contact with eyes, ears mouth and genitals (private parts).                       Wash face,  Genitals (private parts) with  your normal soap.             6.  Wash thoroughly, paying special attention to the area where your surgery  will be performed.  7.  Thoroughly rinse your body with warm water from the  neck down.  8.  DO NOT shower/wash with your normal soap after using and rinsing off  the CHG Soap.                9.  Pat yourself dry with a clean towel.            10.  Wear clean pajamas.            11.  Place clean sheets on your bed the night of your first shower and do not  sleep with pets. Day of Surgery : Do not apply any lotions/deodorants the morning of surgery.  Please wear clean clothes to the hospital/surgery center.   Incentive Spirometer  An incentive spirometer is a tool that can help keep your lungs clear and active. This tool measures how well you are filling your lungs with each breath. Taking long deep breaths may help reverse or decrease the chance of developing breathing (pulmonary) problems (especially infection) following:  A long period of time when you are unable to move or be active. BEFORE THE PROCEDURE   If the spirometer includes an indicator to show your best effort, your nurse or respiratory therapist will set it to a desired goal.  If possible, sit up straight or lean slightly forward. Try not to slouch.  Hold the incentive spirometer in an upright position. INSTRUCTIONS FOR USE  1. Sit on the edge of your bed if possible, or sit up as far as you can in bed or on a chair. 2. Hold the incentive spirometer in an upright position. 3. Breathe out normally. 4. Place the mouthpiece in your mouth and seal your lips tightly around it. 5. Breathe in slowly and as deeply as possible, raising the piston or the ball toward the top of the column. 6. Hold your breath for 3-5 seconds or for as long as possible. Allow the piston or ball to fall to the bottom of the column. 7. Remove the mouthpiece from your mouth and breathe out normally. 8. Rest for a few seconds and repeat Steps 1 through 7 at least 10 times every 1-2 hours when you are awake. Take your time and take a few normal breaths between deep breaths. 9. The spirometer may include an indicator to show your  best effort. Use the indicator as a goal to work toward during each repetition. 10. After each set of 10 deep breaths, practice coughing to be sure your lungs are clear. If you have an incision (the cut made at the time of surgery), support your incision when coughing by placing a pillow or rolled up towels firmly against it. Once you are able to get out of bed, walk around indoors and cough well. You may stop using the incentive spirometer when instructed by your caregiver.  RISKS AND COMPLICATIONS  Take your time so you do not get dizzy or light-headed.  If you are in pain, you may need to take or ask for pain medication before doing incentive spirometry. It is harder to take a deep breath if you are having pain. AFTER USE  Rest and breathe slowly and easily.  It can be helpful to keep track of a log of your progress. Your caregiver can provide  you with a simple table to help with this. If you are using the spirometer at home, follow these instructions: [place] IF:   You are having difficultly using the spirometer.  You have trouble using the spirometer as often as instructed.  Your pain medication is not giving enough relief while using the spirometer.  You develop fever of 100.5 F (38.1 C) or higher. SEEK IMMEDIATE MEDICAL CARE IF:   You cough up bloody sputum that had not been present before.  You develop fever of 102 F (38.9 C) or greater.  You develop worsening pain at or near the incision site. MAKE SURE YOU:   Understand these instructions.  Will watch your condition.  Will get help right away if you are not doing well or get worse. Document Released: 05/23/2006 Document Revised: 04/04/2011 Document Reviewed: 07/24/2006 Exit[employer] Patient Information 2014 Exit[employer], [place].   ________________________________________________________________________  WHAT IS A BLOOD TRANSFUSION? Blood Transfusion Information  A transfusion is the replacement of blood or  some of its parts. Blood is made up of multiple cells which provide different functions.  Red blood cells carry oxygen and are used for blood loss replacement.  White blood cells fight against infection.  Platelets control bleeding.  Plasma helps clot blood.  Other blood products are available for specialized needs, such as hemophilia or other clotting disorders. BEFORE THE TRANSFUSION  Who gives blood for transfusions?   Healthy volunteers who are fully evaluated to make sure their blood is safe. This is blood bank blood. Transfusion therapy is the safest it has ever been in the practice of medicine. Before blood is taken from a donor, a complete history is taken to make sure that person has no history of diseases nor engages in risky social behavior (examples are intravenous drug use or sexual activity with multiple partners). The donor's travel history is screened to minimize risk of transmitting infections, such as malaria. The donated blood is tested for signs of infectious diseases, such as HIV and hepatitis. The blood is then tested to be sure it is compatible with you in order to minimize the chance of a transfusion reaction. If you or a relative donates blood, this is often done in anticipation of surgery and is not appropriate for emergency situations. It takes many days to process the donated blood. RISKS AND COMPLICATIONS Although transfusion therapy is very safe and saves many lives, the main dangers of transfusion include:   Getting an infectious disease.  Developing a transfusion reaction. This is an allergic reaction to something in the blood you were given. Every precaution is taken to prevent this. The decision to have a blood transfusion has been considered carefully by your caregiver before blood is given. Blood is not given unless the benefits outweigh the risks. AFTER THE TRANSFUSION  Right after receiving a blood transfusion, you will usually feel much better and more  energetic. This is especially true if your red blood cells have gotten low (anemic). The transfusion raises the level of the red blood cells which carry oxygen, and this usually causes an energy increase.  The nurse administering the transfusion will monitor you carefully for complications. HOME CARE INSTRUCTIONS  No special instructions are needed after a transfusion. You may find your energy is better. Speak with your caregiver about any limitations on activity for underlying diseases you may have. SEEK MEDICAL CARE IF:   Your condition is not improving after your transfusion.  You develop redness or irritation at the intravenous (IV)  site. SEEK IMMEDIATE MEDICAL CARE IF:  Any of the following symptoms occur over the next 12 hours:  Shaking chills.  You have a temperature by mouth above 102 F (38.9 C), not controlled by medicine.  Chest, back, or muscle pain.  People around you feel you are not acting correctly or are confused.  Shortness of breath or difficulty breathing.  Dizziness and fainting.  You get a rash or develop hives.  You have a decrease in urine output.  Your urine turns a dark color or changes to pink, red, or brown. Any of the following symptoms occur over the next 10 days:  You have a temperature by mouth above 102 F (38.9 C), not controlled by medicine.  Shortness of breath.  Weakness after normal activity.  The white part of the eye turns yellow (jaundice).  You have a decrease in the amount of urine or are urinating less often.  Your urine turns a dark color or changes to pink, red, or brown. Document Released: 01/08/2000 Document Revised: 04/04/2011 Document Reviewed: 08/27/2007 [facility] Patient Information 2014 [place], [place].  _______________________________________________________________________

## 2016-06-10 NOTE — Patient Instructions (Signed)
Preparing for your Surgery  Plan for surgery on Jun 14, 2016 with Dr. Everitt Amber at [name] will be scheduled for a robotic assisted total hysterectomy, bilateral salpingo-oophorectomy.  Pre-operative Testing -You will receive a phone call from presurgical testing at [facility] to arrange for a pre-operative testing appointment before your surgery.  This appointment normally occurs one to two weeks before your scheduled surgery.   -Bring your insurance card, copy of an advanced directive if applicable, medication list  -At that visit, you will be asked to sign a consent for a possible blood transfusion in case a transfusion becomes necessary during surgery.  The need for a blood transfusion is rare but having consent is a necessary part of your care.     -You should not be taking blood thinners or aspirin at least ten days prior to surgery unless instructed by your surgeon.  Day Before Surgery at [place] will be asked to take in a light diet the day before surgery.  Avoid carbonated beverages.  You will be advised to have nothing to eat or drink after midnight the evening before.     Eat a light diet the day before surgery.  Examples including soups, broths, toast, yogurt, mashed potatoes.  Things to avoid include carbonated beverages (fizzy beverages), raw fruits and raw vegetables, or beans.    If your bowels are filled with gas, your surgeon will have difficulty visualizing your pelvic organs which increases your surgical risks.  Your role in recovery Your role is to become active as soon as directed by your doctor, while still giving yourself time to heal.  Rest when you feel tired. You will be asked to do the following in order to speed your recovery:  - Cough and breathe deeply. This helps toclear and expand your lungs and can prevent pneumonia. You may be given a spirometer to practice deep breathing. A staff member will show you how to use  the spirometer. - Do mild physical activity. Walking or moving your legs help your circulation and body functions return to normal. A staff member will help you when you try to walk and will provide you with simple exercises. Do not try to get up or walk alone the first time. - Actively manage your pain. Managing your pain lets you move in comfort. We will ask you to rate your pain on a scale of zero to 10. It is your responsibility to tell your doctor or nurse where and how much you hurt so your pain can be treated.  Special Considerations -If you are diabetic, you may be placed on insulin after surgery to have closer control over your blood sugars to promote healing and recovery.  This does not mean that you will be discharged on insulin.  If applicable, your oral antidiabetics will be resumed when you are tolerating a solid diet.  -Your final pathology results from surgery should be available by the Friday after surgery and the results will be relayed to you when available.   Blood Transfusion Information WHAT IS A BLOOD TRANSFUSION? A transfusion is the replacement of blood or some of its parts. Blood is made up of multiple cells which provide different functions.  Red blood cells carry oxygen and are used for blood loss replacement.  White blood cells fight against infection.  Platelets control bleeding.  Plasma helps clot blood.  Other blood products are available for specialized needs, such as hemophilia or other clotting disorders.  BEFORE THE TRANSFUSION  Who gives blood for transfusions?   You may be able to donate blood to be used at a later date on yourself (autologous donation).  Relatives can be asked to donate blood. This is generally not any safer than if you have received blood from a stranger. The same precautions are taken to ensure safety when a relative's blood is donated.  Healthy volunteers who are fully evaluated to make sure their blood is safe. This is blood  bank blood. Transfusion therapy is the safest it has ever been in the practice of medicine. Before blood is taken from a donor, a complete history is taken to make sure that person has no history of diseases nor engages in risky social behavior (examples are intravenous drug use or sexual activity with multiple partners). The donor's travel history is screened to minimize risk of transmitting infections, such as malaria. The donated blood is tested for signs of infectious diseases, such as HIV and hepatitis. The blood is then tested to be sure it is compatible with you in order to minimize the chance of a transfusion reaction. If you or a relative donates blood, this is often done in anticipation of surgery and is not appropriate for emergency situations. It takes many days to process the donated blood. RISKS AND COMPLICATIONS Although transfusion therapy is very safe and saves many lives, the main dangers of transfusion include:   Getting an infectious disease.  Developing a transfusion reaction. This is an allergic reaction to something in the blood you were given. Every precaution is taken to prevent this. The decision to have a blood transfusion has been considered carefully by your caregiver before blood is given. Blood is not given unless the benefits outweigh the risks.

## 2016-06-13 ENCOUNTER — Encounter (HOSPITAL_COMMUNITY): Payer: Self-pay

## 2016-06-13 ENCOUNTER — Encounter (HOSPITAL_COMMUNITY)
Admission: RE | Admit: 2016-06-13 | Discharge: 2016-06-13 | Disposition: A | Discharge status: Home / Self Care | Payer: PPO | Source: Ambulatory Visit | Attending: Gynecologic Oncology | Admitting: Gynecologic Oncology

## 2016-06-13 DIAGNOSIS — Z794 Long term (current) use of insulin: Secondary | ICD-10-CM | POA: Diagnosis not present

## 2016-06-13 DIAGNOSIS — Z888 Allergy status to other drugs, medicaments and biological substances status: Secondary | ICD-10-CM | POA: Diagnosis not present

## 2016-06-13 DIAGNOSIS — E669 Obesity, unspecified: Secondary | ICD-10-CM | POA: Diagnosis not present

## 2016-06-13 DIAGNOSIS — Z1501 Genetic susceptibility to malignant neoplasm of breast: Secondary | ICD-10-CM | POA: Diagnosis not present

## 2016-06-13 DIAGNOSIS — Z79899 Other long term (current) drug therapy: Secondary | ICD-10-CM | POA: Diagnosis not present

## 2016-06-13 DIAGNOSIS — Z6841 Body Mass Index (BMI) 40.0 and over, adult: Secondary | ICD-10-CM | POA: Diagnosis not present

## 2016-06-13 DIAGNOSIS — Z885 Allergy status to narcotic agent status: Secondary | ICD-10-CM | POA: Diagnosis not present

## 2016-06-13 DIAGNOSIS — Z4002 Encounter for prophylactic removal of ovary: Secondary | ICD-10-CM | POA: Diagnosis not present

## 2016-06-13 DIAGNOSIS — Z886 Allergy status to analgesic agent status: Secondary | ICD-10-CM | POA: Diagnosis not present

## 2016-06-13 DIAGNOSIS — Z803 Family history of malignant neoplasm of breast: Secondary | ICD-10-CM | POA: Diagnosis not present

## 2016-06-13 DIAGNOSIS — Z79891 Long term (current) use of opiate analgesic: Secondary | ICD-10-CM | POA: Diagnosis not present

## 2016-06-13 DIAGNOSIS — E119 Type 2 diabetes mellitus without complications: Secondary | ICD-10-CM | POA: Diagnosis not present

## 2016-06-13 HISTORY — DX: Depression, unspecified: F32.A

## 2016-06-13 HISTORY — DX: Major depressive disorder, single episode, unspecified: F32.9

## 2016-06-13 HISTORY — DX: Anemia, unspecified: D64.9

## 2016-06-13 HISTORY — DX: Type 2 diabetes mellitus without complications: E11.9

## 2016-06-13 HISTORY — DX: Hypothyroidism, unspecified: E03.9

## 2016-06-13 HISTORY — DX: Gastro-esophageal reflux disease without esophagitis: K21.9

## 2016-06-13 HISTORY — DX: Unspecified osteoarthritis, unspecified site: M19.90

## 2016-06-13 HISTORY — DX: Personal history of urinary calculi: Z87.442

## 2016-06-13 HISTORY — DX: Anxiety disorder, unspecified: F41.9

## 2016-06-13 HISTORY — DX: Sleep apnea, unspecified: G47.30

## 2016-06-13 HISTORY — DX: Essential (primary) hypertension: I10

## 2016-06-13 LAB — URINALYSIS, ROUTINE W REFLEX MICROSCOPIC
BACTERIA UA: NONE SEEN
BILIRUBIN URINE: NEGATIVE
Glucose, UA: NEGATIVE mg/dL
Hgb urine dipstick: NEGATIVE
Ketones, ur: NEGATIVE mg/dL
Nitrite: NEGATIVE
PH: 5 (ref 5.0–8.0)
Protein, ur: NEGATIVE mg/dL
SPECIFIC GRAVITY, URINE: 1.021 (ref 1.005–1.030)

## 2016-06-13 LAB — CBC WITH DIFFERENTIAL/PLATELET
BASOS ABS: 0 10*3/uL (ref 0.0–0.1)
BASOS PCT: 0 %
EOS PCT: 2 %
Eosinophils Absolute: 0.2 10*3/uL (ref 0.0–0.7)
HCT: 37.3 % (ref 36.0–46.0)
Hemoglobin: 11.3 g/dL — ABNORMAL LOW (ref 12.0–15.0)
Lymphocytes Relative: 28 %
Lymphs Abs: 2.4 10*3/uL (ref 0.7–4.0)
MCH: 24.5 pg — ABNORMAL LOW (ref 26.0–34.0)
MCHC: 30.3 g/dL (ref 30.0–36.0)
MCV: 80.9 fL (ref 78.0–100.0)
MONO ABS: 0.5 10*3/uL (ref 0.1–1.0)
Monocytes Relative: 6 %
Neutro Abs: 5.5 10*3/uL (ref 1.7–7.7)
Neutrophils Relative %: 65 %
PLATELETS: 115 10*3/uL — AB (ref 150–400)
RBC: 4.61 MIL/uL (ref 3.87–5.11)
RDW: 14.7 % (ref 11.5–15.5)
WBC: 8.6 10*3/uL (ref 4.0–10.5)

## 2016-06-13 LAB — COMPREHENSIVE METABOLIC PANEL
ALT: 15 U/L (ref 14–54)
ANION GAP: 10 (ref 5–15)
AST: 27 U/L (ref 15–41)
Albumin: 3.8 g/dL (ref 3.5–5.0)
Alkaline Phosphatase: 89 U/L (ref 38–126)
BUN: 18 mg/dL (ref 6–20)
CO2: 29 mmol/L (ref 22–32)
Calcium: 9.3 mg/dL (ref 8.9–10.3)
Chloride: 100 mmol/L — ABNORMAL LOW (ref 101–111)
Creatinine, Ser: 0.92 mg/dL (ref 0.44–1.00)
GFR calc Af Amer: >60 mL/min (ref >60)
GLUCOSE: 152 mg/dL — AB (ref 65–99)
POTASSIUM: 3.8 mmol/L (ref 3.5–5.1)
Sodium: 139 mmol/L (ref 135–145)
Total Bilirubin: 0.6 mg/dL (ref 0.3–1.2)
Total Protein: 7.2 g/dL (ref 6.5–8.1)

## 2016-06-13 LAB — PREGNANCY, URINE: PREG TEST UR: NEGATIVE

## 2016-06-13 LAB — GLUCOSE, CAPILLARY: GLUCOSE-CAPILLARY: 163 mg/dL — AB (ref 65–99)

## 2016-06-13 LAB — ABO/RH: ABO/RH(D): A NEG

## 2016-06-13 NOTE — Progress Notes (Signed)
Cbc/diff done 06/13/16 faxed to Dr. Denman George via epic

## 2016-06-13 NOTE — Patient Instructions (Signed)
Linda [name]  06/13/2016   Your procedure is scheduled on: 06/14/16  Report to [facility] Main  Entrance Take [place]  elevators to 3rd floor to  [place] at        [phone].    Call this number if you have problems the morning of surgery [phone]    Remember: ONLY 1 PERSON MAY GO WITH YOU TO SHORT STAY TO GET  READY MORNING OF YOUR SURGERY.  Do not eat food or drink liquids :After Midnight.     Take these medicines the morning of surgery with A SIP OF WATER: bupropion,gabapentin,levothyroxine,protonix, morphine(Kadian prn) DO NOT TAKE ANY  ORAL DIABETIC MEDICATIONS DAY OF YOUR SURGERY                               You may not have any metal on your body including hair pins and              piercings  Do not wear jewelry, make-up, lotions, powders or perfumes, deodorant             Do not wear nail polish.  Do not shave  48 hours prior to surgery.     Do not bring valuables to the hospital. [place].  Contacts, dentures or bridgework may not be worn into surgery.  Leave suitcase in the car. After surgery it may be brought to your room.                Please read over the following fact sheets you were given: _____________________________________________________________________            [facility] - Preparing for Surgery Before surgery, you can play an important role.  Because skin is not sterile, your skin needs to be as free of germs as possible.  You can reduce the number of germs on your skin by washing with CHG (chlorahexidine gluconate) soap before surgery.  CHG is an antiseptic cleaner which kills germs and bonds with the skin to continue killing germs even after washing. Please DO NOT use if you have an allergy to CHG or antibacterial soaps.  If your skin becomes reddened/irritated stop using the CHG and inform your nurse when you arrive at Short Stay. Do not shave (including legs and  underarms) for at least 48 hours prior to the first CHG shower.  You may shave your face/neck. Please follow these instructions carefully:  1.  Shower with CHG Soap the night before surgery and the  morning of Surgery.  2.  If you choose to wash your hair, wash your hair first as usual with your  normal  shampoo.  3.  After you shampoo, rinse your hair and body thoroughly to remove the  shampoo.                           4.  Use CHG as you would any other liquid soap.  You can apply chg directly  to the skin and wash                       Gently with a scrungie or clean washcloth.  5.  Apply the  CHG Soap to your body ONLY FROM THE NECK DOWN.   Do not use on face/ open                           Wound or open sores. Avoid contact with eyes, ears mouth and genitals (private parts).                       Wash face,  Genitals (private parts) with your normal soap.             6.  Wash thoroughly, paying special attention to the area where your surgery  will be performed.  7.  Thoroughly rinse your body with warm water from the neck down.  8.  DO NOT shower/wash with your normal soap after using and rinsing off  the CHG Soap.                9.  Pat yourself dry with a clean towel.            10.  Wear clean pajamas.            11.  Place clean sheets on your bed the night of your first shower and do not  sleep with pets. Day of Surgery : Do not apply any lotions/deodorants the morning of surgery.  Please wear clean clothes to the hospital/surgery center.  FAILURE TO FOLLOW THESE INSTRUCTIONS MAY RESULT IN THE CANCELLATION OF YOUR SURGERY PATIENT SIGNATURE_________________________________  NURSE SIGNATURE__________________________________  ________________________________________________________________________  WHAT IS A BLOOD TRANSFUSION? Blood Transfusion Information  A transfusion is the replacement of blood or some of its parts. Blood is made up of multiple cells which provide different  functions.  Red blood cells carry oxygen and are used for blood loss replacement.  White blood cells fight against infection.  Platelets control bleeding.  Plasma helps clot blood.  Other blood products are available for specialized needs, such as hemophilia or other clotting disorders. BEFORE THE TRANSFUSION  Who gives blood for transfusions?   Healthy volunteers who are fully evaluated to make sure their blood is safe. This is blood bank blood. Transfusion therapy is the safest it has ever been in the practice of medicine. Before blood is taken from a donor, a complete history is taken to make sure that person has no history of diseases nor engages in risky social behavior (examples are intravenous drug use or sexual activity with multiple partners). The donor's travel history is screened to minimize risk of transmitting infections, such as malaria. The donated blood is tested for signs of infectious diseases, such as HIV and hepatitis. The blood is then tested to be sure it is compatible with you in order to minimize the chance of a transfusion reaction. If you or a relative donates blood, this is often done in anticipation of surgery and is not appropriate for emergency situations. It takes many days to process the donated blood. RISKS AND COMPLICATIONS Although transfusion therapy is very safe and saves many lives, the main dangers of transfusion include:   Getting an infectious disease.  Developing a transfusion reaction. This is an allergic reaction to something in the blood you were given. Every precaution is taken to prevent this. The decision to have a blood transfusion has been considered carefully by your caregiver before blood is given. Blood is not given unless the benefits outweigh the risks. AFTER THE TRANSFUSION  Right after receiving a blood transfusion,  you will usually feel much better and more energetic. This is especially true if your red blood cells have gotten low  (anemic). The transfusion raises the level of the red blood cells which carry oxygen, and this usually causes an energy increase.  The nurse administering the transfusion will monitor you carefully for complications. HOME CARE INSTRUCTIONS  No special instructions are needed after a transfusion. You may find your energy is better. Speak with your caregiver about any limitations on activity for underlying diseases you may have. SEEK MEDICAL CARE IF:   Your condition is not improving after your transfusion.  You develop redness or irritation at the intravenous (IV) site. SEEK IMMEDIATE MEDICAL CARE IF:  Any of the following symptoms occur over the next 12 hours:  Shaking chills.  You have a temperature by mouth above 102 F (38.9 C), not controlled by medicine.  Chest, back, or muscle pain.  People around you feel you are not acting correctly or are confused.  Shortness of breath or difficulty breathing.  Dizziness and fainting.  You get a rash or develop hives.  You have a decrease in urine output.  Your urine turns a dark color or changes to pink, red, or brown. Any of the following symptoms occur over the next 10 days:  You have a temperature by mouth above 102 F (38.9 C), not controlled by medicine.  Shortness of breath.  Weakness after normal activity.  The white part of the eye turns yellow (jaundice).  You have a decrease in the amount of urine or are urinating less often.  Your urine turns a dark color or changes to pink, red, or brown. Document Released: 01/08/2000 Document Revised: 04/04/2011 Document Reviewed: 08/27/2007 ExitCare Patient Information 2014 ExitCare, [place].  _______________________________________________________________________How to Manage Your Diabetes Before and After Surgery  Why is it important to control my blood sugar before and after surgery? . Improving blood sugar levels before and after surgery helps healing and can limit  problems. . A way of improving blood sugar control is eating a healthy diet by: o  Eating less sugar and carbohydrates o  Increasing activity/exercise o  Talking with your doctor about reaching your blood sugar goals . High blood sugars (greater than 180 mg/dL) can raise your risk of infections and slow your recovery, so you will need to focus on controlling your diabetes during the weeks before surgery. . Make sure that the doctor who takes care of your diabetes knows about your planned surgery including the date and location.  How do I manage my blood sugar before surgery? . Check your blood sugar at least 4 times a day, starting 2 days before surgery, to make sure that the level is not too high or low. o Check your blood sugar the morning of your surgery when you wake up and every 2 hours until you get to the Short Stay unit. . If your blood sugar is less than 70 mg/dL, you will need to treat for low blood sugar: o Do not take insulin. o Treat a low blood sugar (less than 70 mg/dL) with  cup of clear juice (cranberry or apple), 4 glucose tablets, OR glucose gel. o Recheck blood sugar in 15 minutes after treatment (to make sure it is greater than 70 mg/dL). If your blood sugar is not greater than 70 mg/dL on recheck, call [phone] for further instructions. . Report your blood sugar to the short stay nurse when you get to Short Stay.  . If you  are admitted to the hospital after surgery: o Your blood sugar will be checked by the staff and you will probably be given insulin after surgery (instead of oral diabetes medicines) to make sure you have good blood sugar levels. o The goal for blood sugar control after surgery is 80-180 mg/dL.   WHAT DO I DO ABOUT MY DIABETES MEDICATION?  Marland Kitchen Do not take oral diabetes medicines (pills) the morning of surgery.  . THE NIGHT BEFORE SURGERY, take     50 %  of       insulin.       . THE MORNING OF SURGERY, take  0 units of         insulin.  . The  day of surgery, do not take other diabetes injectables, including Byetta (exenatide), Bydureon (exenatide ER), Victoza (liraglutide), or Trulicity (dulaglutide).  . If your CBG is greater than 220 mg/dL, you may take  of your sliding scale  . (correction) dose of insulin.      Patient Signature:  Date:   Nurse Signature:  Date:   Reviewed and Endorsed by [facility] Patient Education Committee, August 2015

## 2016-06-14 ENCOUNTER — Encounter (HOSPITAL_COMMUNITY): Payer: Self-pay | Admitting: *Deleted

## 2016-06-14 ENCOUNTER — Encounter (HOSPITAL_COMMUNITY)
Admission: RE | Disposition: A | Discharge status: Home / Self Care | Payer: Self-pay | Source: Ambulatory Visit | Attending: Obstetrics & Gynecology

## 2016-06-14 ENCOUNTER — Ambulatory Visit (HOSPITAL_COMMUNITY): Payer: PPO | Admitting: Certified Registered Nurse Anesthetist

## 2016-06-14 ENCOUNTER — Ambulatory Visit (HOSPITAL_COMMUNITY)
Admission: RE | Admit: 2016-06-14 | Discharge: 2016-06-15 | Disposition: A | Discharge status: Home / Self Care | Payer: PPO | Source: Ambulatory Visit | Attending: Obstetrics & Gynecology | Admitting: Obstetrics & Gynecology

## 2016-06-14 DIAGNOSIS — Z1501 Genetic susceptibility to malignant neoplasm of breast: Secondary | ICD-10-CM | POA: Diagnosis not present

## 2016-06-14 DIAGNOSIS — I10 Essential (primary) hypertension: Secondary | ICD-10-CM | POA: Diagnosis not present

## 2016-06-14 DIAGNOSIS — E119 Type 2 diabetes mellitus without complications: Secondary | ICD-10-CM | POA: Diagnosis not present

## 2016-06-14 DIAGNOSIS — Z1502 Genetic susceptibility to malignant neoplasm of ovary: Secondary | ICD-10-CM

## 2016-06-14 DIAGNOSIS — E669 Obesity, unspecified: Secondary | ICD-10-CM | POA: Diagnosis not present

## 2016-06-14 DIAGNOSIS — Z803 Family history of malignant neoplasm of breast: Secondary | ICD-10-CM | POA: Diagnosis not present

## 2016-06-14 DIAGNOSIS — Z4002 Encounter for prophylactic removal of ovary: Secondary | ICD-10-CM | POA: Insufficient documentation

## 2016-06-14 DIAGNOSIS — Z886 Allergy status to analgesic agent status: Secondary | ICD-10-CM | POA: Diagnosis not present

## 2016-06-14 DIAGNOSIS — Z6841 Body Mass Index (BMI) 40.0 and over, adult: Secondary | ICD-10-CM | POA: Insufficient documentation

## 2016-06-14 DIAGNOSIS — Z79899 Other long term (current) drug therapy: Secondary | ICD-10-CM | POA: Insufficient documentation

## 2016-06-14 DIAGNOSIS — Z885 Allergy status to narcotic agent status: Secondary | ICD-10-CM | POA: Insufficient documentation

## 2016-06-14 DIAGNOSIS — Z79891 Long term (current) use of opiate analgesic: Secondary | ICD-10-CM | POA: Diagnosis not present

## 2016-06-14 DIAGNOSIS — N800 Endometriosis of uterus: Secondary | ICD-10-CM | POA: Diagnosis not present

## 2016-06-14 DIAGNOSIS — Z794 Long term (current) use of insulin: Secondary | ICD-10-CM | POA: Insufficient documentation

## 2016-06-14 DIAGNOSIS — Z888 Allergy status to other drugs, medicaments and biological substances status: Secondary | ICD-10-CM | POA: Insufficient documentation

## 2016-06-14 DIAGNOSIS — Z1509 Genetic susceptibility to other malignant neoplasm: Secondary | ICD-10-CM

## 2016-06-14 DIAGNOSIS — R8569 Abnormal cytological findings in specimens from other digestive organs and abdominal cavity: Secondary | ICD-10-CM | POA: Diagnosis not present

## 2016-06-14 HISTORY — PX: ROBOTIC ASSISTED TOTAL HYSTERECTOMY WITH BILATERAL SALPINGO OOPHERECTOMY: SHX6086

## 2016-06-14 LAB — GLUCOSE, CAPILLARY
GLUCOSE-CAPILLARY: 225 mg/dL — AB (ref 65–99)
Glucose-Capillary: 120 mg/dL — ABNORMAL HIGH (ref 65–99)
Glucose-Capillary: 161 mg/dL — ABNORMAL HIGH (ref 65–99)
Glucose-Capillary: 264 mg/dL — ABNORMAL HIGH (ref 65–99)

## 2016-06-14 LAB — HEMOGLOBIN A1C
HEMOGLOBIN A1C: 9.1 % — AB (ref 4.8–5.6)
MEAN PLASMA GLUCOSE: 214 mg/dL

## 2016-06-14 LAB — TYPE AND SCREEN
ABO/RH(D): A NEG
ANTIBODY SCREEN: NEGATIVE

## 2016-06-14 SURGERY — ROBOTIC ASSISTED TOTAL HYSTERECTOMY WITH BILATERAL SALPINGO OOPHORECTOMY
Anesthesia: General | Laterality: Bilateral

## 2016-06-14 MED ORDER — INSULIN ASPART 100 UNIT/ML ~~LOC~~ SOLN
0.0000 [IU] | Freq: Three times a day (TID) | SUBCUTANEOUS | Status: DC
  Administered 2016-06-14: 7 [IU] via SUBCUTANEOUS
  Administered 2016-06-15 (×2): 4 [IU] via SUBCUTANEOUS

## 2016-06-14 MED ORDER — METHOCARBAMOL 500 MG PO TABS
500.0000 mg | ORAL_TABLET | Freq: Two times a day (BID) | ORAL | Status: DC
  Administered 2016-06-14 – 2016-06-15 (×2): 500 mg via ORAL
  Filled 2016-06-14 (×2): qty 1

## 2016-06-14 MED ORDER — PROPOFOL 10 MG/ML IV BOLUS
INTRAVENOUS | Status: AC
  Filled 2016-06-14: qty 20

## 2016-06-14 MED ORDER — PANTOPRAZOLE SODIUM 40 MG PO TBEC
40.0000 mg | DELAYED_RELEASE_TABLET | Freq: Two times a day (BID) | ORAL | Status: DC
  Administered 2016-06-14 – 2016-06-15 (×2): 40 mg via ORAL
  Filled 2016-06-14 (×2): qty 1

## 2016-06-14 MED ORDER — ONDANSETRON HCL 4 MG/2ML IJ SOLN
4.0000 mg | Freq: Four times a day (QID) | INTRAMUSCULAR | Status: DC | PRN
  Administered 2016-06-14: 4 mg via INTRAVENOUS
  Filled 2016-06-14: qty 2

## 2016-06-14 MED ORDER — CEFAZOLIN SODIUM-DEXTROSE 2-4 GM/100ML-% IV SOLN
2.0000 g | INTRAVENOUS | Status: AC
  Administered 2016-06-14: 2 g via INTRAVENOUS
  Filled 2016-06-14: qty 100

## 2016-06-14 MED ORDER — LOSARTAN POTASSIUM 25 MG PO TABS
25.0000 mg | ORAL_TABLET | Freq: Every day | ORAL | Status: DC
  Administered 2016-06-15: 25 mg via ORAL
  Filled 2016-06-14: qty 1

## 2016-06-14 MED ORDER — HYDROMORPHONE HCL 1 MG/ML IJ SOLN
0.2500 mg | INTRAMUSCULAR | Status: DC | PRN
  Administered 2016-06-14: 0.5 mg via INTRAVENOUS

## 2016-06-14 MED ORDER — MEPERIDINE HCL 50 MG/ML IJ SOLN: 6.2500 mg | INTRAMUSCULAR | Status: DC | PRN

## 2016-06-14 MED ORDER — STERILE WATER FOR IRRIGATION IR SOLN
Status: DC | PRN
  Administered 2016-06-14: 1000 mL

## 2016-06-14 MED ORDER — HYDROMORPHONE HCL 1 MG/ML IJ SOLN
0.2000 mg | INTRAMUSCULAR | Status: DC | PRN
  Administered 2016-06-14 (×3): 0.5 mg via INTRAVENOUS
  Filled 2016-06-14 (×3): qty 0.5

## 2016-06-14 MED ORDER — DEXAMETHASONE SODIUM PHOSPHATE 10 MG/ML IJ SOLN
INTRAMUSCULAR | Status: AC
  Filled 2016-06-14: qty 1

## 2016-06-14 MED ORDER — ENOXAPARIN SODIUM 40 MG/0.4ML ~~LOC~~ SOLN
40.0000 mg | SUBCUTANEOUS | Status: DC
  Administered 2016-06-15: 40 mg via SUBCUTANEOUS
  Filled 2016-06-14: qty 0.4

## 2016-06-14 MED ORDER — PROMETHAZINE HCL 25 MG/ML IJ SOLN
6.2500 mg | INTRAMUSCULAR | Status: DC | PRN
  Administered 2016-06-14: 6.25 mg via INTRAVENOUS

## 2016-06-14 MED ORDER — LACTATED RINGERS IV SOLN: INTRAVENOUS | Status: DC

## 2016-06-14 MED ORDER — PRAVASTATIN SODIUM 20 MG PO TABS
20.0000 mg | ORAL_TABLET | Freq: Every day | ORAL | Status: DC
  Administered 2016-06-14 – 2016-06-15 (×2): 20 mg via ORAL
  Filled 2016-06-14 (×2): qty 1

## 2016-06-14 MED ORDER — INSULIN GLARGINE 100 UNIT/ML ~~LOC~~ SOLN
54.0000 [IU] | Freq: Every day | SUBCUTANEOUS | Status: DC
  Administered 2016-06-14: 54 [IU] via SUBCUTANEOUS
  Filled 2016-06-14 (×2): qty 0.54

## 2016-06-14 MED ORDER — PROMETHAZINE HCL 25 MG/ML IJ SOLN: 6.2500 mg | INTRAMUSCULAR | Status: DC | PRN

## 2016-06-14 MED ORDER — PROMETHAZINE HCL 25 MG/ML IJ SOLN
INTRAMUSCULAR | Status: AC
  Administered 2016-06-14: 6.25 mg via INTRAVENOUS
  Filled 2016-06-14: qty 1

## 2016-06-14 MED ORDER — DEXAMETHASONE SODIUM PHOSPHATE 10 MG/ML IJ SOLN
INTRAMUSCULAR | Status: DC | PRN
  Administered 2016-06-14: 10 mg via INTRAVENOUS

## 2016-06-14 MED ORDER — MORPHINE SULFATE 15 MG PO TABS
15.0000 mg | ORAL_TABLET | ORAL | Status: DC | PRN
  Administered 2016-06-14 – 2016-06-15 (×2): 15 mg via ORAL
  Filled 2016-06-14 (×2): qty 1

## 2016-06-14 MED ORDER — SUGAMMADEX SODIUM 200 MG/2ML IV SOLN
INTRAVENOUS | Status: DC | PRN
  Administered 2016-06-14: 100 mg via INTRAVENOUS
  Administered 2016-06-14: 200 mg via INTRAVENOUS

## 2016-06-14 MED ORDER — MIDAZOLAM HCL 2 MG/2ML IJ SOLN
INTRAMUSCULAR | Status: AC
  Filled 2016-06-14: qty 2

## 2016-06-14 MED ORDER — LACTATED RINGERS IV SOLN
INTRAVENOUS | Status: DC
  Administered 2016-06-14: 09:00:00 via INTRAVENOUS

## 2016-06-14 MED ORDER — HYDROMORPHONE HCL 1 MG/ML IJ SOLN
INTRAMUSCULAR | Status: AC
  Administered 2016-06-14: 0.5 mg via INTRAVENOUS
  Filled 2016-06-14: qty 1

## 2016-06-14 MED ORDER — LEVOMILNACIPRAN HCL ER 80 MG PO CP24: 80.0000 | ORAL_CAPSULE | Freq: Every day | ORAL | Status: DC

## 2016-06-14 MED ORDER — LIDOCAINE 2% (20 MG/ML) 5 ML SYRINGE
INTRAMUSCULAR | Status: DC | PRN
  Administered 2016-06-14: 60 mg via INTRAVENOUS
  Administered 2016-06-14: 40 mg via INTRAVENOUS

## 2016-06-14 MED ORDER — MIDAZOLAM HCL 2 MG/2ML IJ SOLN
INTRAMUSCULAR | Status: DC | PRN
  Administered 2016-06-14: 0.5 mg via INTRAVENOUS
  Administered 2016-06-14: 1.5 mg via INTRAVENOUS

## 2016-06-14 MED ORDER — SUCRALFATE 1 G PO TABS
1.0000 g | ORAL_TABLET | Freq: Three times a day (TID) | ORAL | Status: DC
  Administered 2016-06-14 – 2016-06-15 (×4): 1 g via ORAL
  Filled 2016-06-14 (×4): qty 1

## 2016-06-14 MED ORDER — GABAPENTIN 300 MG PO CAPS
300.0000 mg | ORAL_CAPSULE | Freq: Three times a day (TID) | ORAL | Status: DC
  Administered 2016-06-14 – 2016-06-15 (×3): 300 mg via ORAL
  Filled 2016-06-14 (×3): qty 1

## 2016-06-14 MED ORDER — LACTATED RINGERS IR SOLN
Status: DC | PRN
  Administered 2016-06-14: 1000 mL

## 2016-06-14 MED ORDER — LIDOCAINE 2% (20 MG/ML) 5 ML SYRINGE
INTRAMUSCULAR | Status: AC
  Filled 2016-06-14: qty 5

## 2016-06-14 MED ORDER — FENTANYL CITRATE (PF) 250 MCG/5ML IJ SOLN
INTRAMUSCULAR | Status: DC | PRN
  Administered 2016-06-14: 75 ug via INTRAVENOUS
  Administered 2016-06-14 (×2): 50 ug via INTRAVENOUS
  Administered 2016-06-14: 75 ug via INTRAVENOUS

## 2016-06-14 MED ORDER — BUPROPION HCL ER (XL) 300 MG PO TB24
300.0000 mg | ORAL_TABLET | Freq: Every day | ORAL | Status: DC
  Administered 2016-06-15: 300 mg via ORAL
  Filled 2016-06-14: qty 1

## 2016-06-14 MED ORDER — ROCURONIUM BROMIDE 50 MG/5ML IV SOSY
PREFILLED_SYRINGE | INTRAVENOUS | Status: AC
  Filled 2016-06-14: qty 5

## 2016-06-14 MED ORDER — SUGAMMADEX SODIUM 200 MG/2ML IV SOLN
INTRAVENOUS | Status: AC
  Filled 2016-06-14: qty 2

## 2016-06-14 MED ORDER — SUCCINYLCHOLINE CHLORIDE 200 MG/10ML IV SOSY
PREFILLED_SYRINGE | INTRAVENOUS | Status: AC
  Filled 2016-06-14: qty 10

## 2016-06-14 MED ORDER — FENTANYL CITRATE (PF) 250 MCG/5ML IJ SOLN
INTRAMUSCULAR | Status: AC
  Filled 2016-06-14: qty 5

## 2016-06-14 MED ORDER — LEVOTHYROXINE SODIUM 75 MCG PO TABS
75.0000 ug | ORAL_TABLET | Freq: Every day | ORAL | Status: DC
  Administered 2016-06-15: 75 ug via ORAL
  Filled 2016-06-14: qty 1

## 2016-06-14 MED ORDER — ONDANSETRON HCL 4 MG/2ML IJ SOLN
INTRAMUSCULAR | Status: DC | PRN
  Administered 2016-06-14: 4 mg via INTRAVENOUS

## 2016-06-14 MED ORDER — ONDANSETRON HCL 4 MG PO TABS: 4.0000 mg | ORAL_TABLET | Freq: Four times a day (QID) | ORAL | Status: DC | PRN

## 2016-06-14 MED ORDER — ONDANSETRON HCL 4 MG/2ML IJ SOLN
INTRAMUSCULAR | Status: AC
  Filled 2016-06-14: qty 2

## 2016-06-14 MED ORDER — FENTANYL CITRATE (PF) 100 MCG/2ML IJ SOLN
INTRAMUSCULAR | Status: AC
  Filled 2016-06-14: qty 2

## 2016-06-14 MED ORDER — KCL IN DEXTROSE-NACL 20-5-0.45 MEQ/L-%-% IV SOLN
INTRAVENOUS | Status: DC
  Administered 2016-06-14 – 2016-06-15 (×2): via INTRAVENOUS
  Filled 2016-06-14 (×3): qty 1000

## 2016-06-14 MED ORDER — ROCURONIUM BROMIDE 50 MG/5ML IV SOSY
PREFILLED_SYRINGE | INTRAVENOUS | Status: DC | PRN
Start: 2016-06-14 — End: 2016-06-14
  Administered 2016-06-14: 20 mg via INTRAVENOUS
  Administered 2016-06-14 (×2): 10 mg via INTRAVENOUS
  Administered 2016-06-14: 50 mg via INTRAVENOUS
  Administered 2016-06-14: 10 mg via INTRAVENOUS

## 2016-06-14 MED ORDER — PROPOFOL 10 MG/ML IV BOLUS
INTRAVENOUS | Status: DC | PRN
  Administered 2016-06-14: 150 mg via INTRAVENOUS

## 2016-06-14 MED ORDER — ENOXAPARIN SODIUM 40 MG/0.4ML ~~LOC~~ SOLN
40.0000 mg | SUBCUTANEOUS | Status: AC
Start: 2016-06-14 — End: 2016-06-14
  Administered 2016-06-14: 40 mg via SUBCUTANEOUS
  Filled 2016-06-14: qty 0.4

## 2016-06-14 MED ORDER — MORPHINE SULFATE ER 30 MG PO TBCR
30.0000 mg | EXTENDED_RELEASE_TABLET | Freq: Two times a day (BID) | ORAL | Status: DC
  Administered 2016-06-14 – 2016-06-15 (×2): 30 mg via ORAL
  Filled 2016-06-14 (×2): qty 1

## 2016-06-14 MED ORDER — HYDROMORPHONE HCL 1 MG/ML IJ SOLN: 0.2500 mg | INTRAMUSCULAR | Status: DC | PRN

## 2016-06-14 MED ORDER — FAMOTIDINE 20 MG PO TABS
20.0000 mg | ORAL_TABLET | Freq: Two times a day (BID) | ORAL | Status: DC
  Administered 2016-06-14 – 2016-06-15 (×2): 20 mg via ORAL
  Filled 2016-06-14 (×2): qty 1

## 2016-06-14 SURGICAL SUPPLY — 53 items
BENZOIN TINCTURE PRP APPL 2/3 (GAUZE/BANDAGES/DRESSINGS) ×2 IMPLANT
CHLORAPREP W/TINT 26ML (MISCELLANEOUS) ×4 IMPLANT
COVER BACK TABLE 60X90IN (DRAPES) IMPLANT
COVER TIP SHEARS 8 DVNC (MISCELLANEOUS) ×1 IMPLANT
COVER TIP SHEARS 8MM DA VINCI (MISCELLANEOUS) ×1
DRAPE ARM DVNC X/XI (DISPOSABLE) ×4 IMPLANT
DRAPE COLUMN DVNC XI (DISPOSABLE) ×1 IMPLANT
DRAPE DA VINCI XI ARM (DISPOSABLE) ×4
DRAPE DA VINCI XI COLUMN (DISPOSABLE) ×1
DRAPE SHEET LG 3/4 BI-LAMINATE (DRAPES) ×4 IMPLANT
DRAPE SURG IRRIG POUCH 19X23 (DRAPES) ×2 IMPLANT
DRSG TEGADERM 2-3/8X2-3/4 SM (GAUZE/BANDAGES/DRESSINGS) ×10 IMPLANT
ELECT REM PT RETURN 15FT ADLT (MISCELLANEOUS) ×2 IMPLANT
GAUZE SPONGE 2X2 8PLY STRL LF (GAUZE/BANDAGES/DRESSINGS) ×1 IMPLANT
GLOVE BIO SURGEON STRL SZ 6.5 (GLOVE) ×10 IMPLANT
GLOVE BIOGEL PI IND STRL 7.0 (GLOVE) ×2 IMPLANT
GLOVE BIOGEL PI INDICATOR 7.0 (GLOVE) ×2
GOWN STRL REUS W/ TWL LRG LVL3 (GOWN DISPOSABLE) ×3 IMPLANT
GOWN STRL REUS W/TWL LRG LVL3 (GOWN DISPOSABLE) ×3
HOLDER FOLEY CATH W/STRAP (MISCELLANEOUS) IMPLANT
IRRIG SUCT STRYKERFLOW 2 WTIP (MISCELLANEOUS) ×2
IRRIGATION SUCT STRKRFLW 2 WTP (MISCELLANEOUS) ×1 IMPLANT
KIT BASIN OR (CUSTOM PROCEDURE TRAY) ×2 IMPLANT
KIT PROCEDURE DA VINCI SI (MISCELLANEOUS)
KIT PROCEDURE DVNC SI (MISCELLANEOUS) IMPLANT
MANIPULATOR UTERINE 4.5 ZUMI (MISCELLANEOUS) ×2 IMPLANT
NDL SAFETY ECLIPSE 18X1.5 (NEEDLE) IMPLANT
NEEDLE HYPO 18GX1.5 SHARP (NEEDLE)
NEEDLE SPNL 18GX3.5 QUINCKE PK (NEEDLE) IMPLANT
OCCLUDER COLPOPNEUMO (BALLOONS) ×2 IMPLANT
PAD POSITIONING PINK XL (MISCELLANEOUS) ×2 IMPLANT
PORT ACCESS TROCAR AIRSEAL 12 (TROCAR) ×1 IMPLANT
PORT ACCESS TROCAR AIRSEAL 5M (TROCAR) ×1
POUCH SPECIMEN RETRIEVAL 10MM (ENDOMECHANICALS) IMPLANT
SEAL CANN UNIV 5-8 DVNC XI (MISCELLANEOUS) ×4 IMPLANT
SEAL XI 5MM-8MM UNIVERSAL (MISCELLANEOUS) ×4
SET TRI-LUMEN FLTR TB AIRSEAL (TUBING) ×2 IMPLANT
SHEET LAVH (DRAPES) ×2 IMPLANT
SOLUTION ELECTROLUBE (MISCELLANEOUS) ×2 IMPLANT
SPONGE GAUZE 2X2 STER 10/PKG (GAUZE/BANDAGES/DRESSINGS) ×1
STRIP CLOSURE SKIN 1/2X4 (GAUZE/BANDAGES/DRESSINGS) ×2 IMPLANT
SUT VIC AB 0 CT1 27 (SUTURE) ×1
SUT VIC AB 0 CT1 27XBRD ANTBC (SUTURE) ×1 IMPLANT
SUT VIC AB 4-0 PS2 27 (SUTURE) ×4 IMPLANT
SYR 10ML LL (SYRINGE) IMPLANT
SYR 50ML LL SCALE MARK (SYRINGE) ×2 IMPLANT
TOWEL OR 17X26 10 PK STRL BLUE (TOWEL DISPOSABLE) ×4 IMPLANT
TOWEL OR NON WOVEN STRL DISP B (DISPOSABLE) ×2 IMPLANT
TRAP SPECIMEN MUCOUS 40CC (MISCELLANEOUS) ×2 IMPLANT
TRAY LAPAROSCOPIC (CUSTOM PROCEDURE TRAY) ×2 IMPLANT
TROCAR BLADELESS OPT 5 100 (ENDOMECHANICALS) ×2 IMPLANT
UNDERPAD 30X30 (UNDERPADS AND DIAPERS) ×2 IMPLANT
WATER STERILE IRR 1500ML POUR (IV SOLUTION) IMPLANT

## 2016-06-14 NOTE — Progress Notes (Signed)
Upon dangling the pt a light red bloody drainage was noted on the  bed pad,. Pt stood up at the bedside and noticed a sanitary pad was also soak. We will  Continue to monitor the pt.

## 2016-06-14 NOTE — Interval H&P Note (Signed)
History and Physical Interval Note:  06/14/2016 9:57 AM  Linda [name]  has presented today for surgery, with the diagnosis of BRCA1 MUTATION  The various methods of treatment have been discussed with the patient and family. After consideration of risks, benefits and other options for treatment, the patient has consented to  Procedure(s): ROBOTIC ASSISTED TOTAL HYSTERECTOMY WITH BILATERAL SALPINGO OOPHORECTOMY (Bilateral) as a surgical intervention .  The patient's history has been reviewed, patient examined, no change in status, stable for surgery.  I have reviewed the patient's chart and labs.  Questions were answered to the patient's satisfaction.     Edie A.

## 2016-06-14 NOTE — Op Note (Signed)
PATIENT: Linda [name] DATE OF BIRTH: [birth year] ENCOUNTER DATE: 06/14/2016   Preop Diagnosis: BRCA1+   Postoperative Diagnosis: same.   Surgery: Total robotic hysterectomy, bilateral salpingo-oophorectomy   Surgeons:  Connor Foxworthy A. Alycia Rossetti, MD; Lahoma Crocker, MD   Anesthesia: General   Estimated blood loss: 50  ml   IVF: 800 ml   Urine output: 841  ml   Complications: None   Pathology: Uterus, cervix, bilateral tubes and ovaries, washings  Operative findings: Significant intra-abdominal obesity. Normal appearing uterus, bilateral adnexa and omentum. Adhesion of omentum to the anterior abdominal wall.  Procedure: The patient was identified in the preoperative holding area. Informed consent was signed on the chart. Patient was seen history was reviewed and exam was performed.   The patient was then taken to the operating room and placed in the supine position with SCD hose on. She was then placed in the dorsolithotomy position. Her arms were tucked at her side with appropriate precautions on the gel pad. General anesthesia was then induced without difficulty. Shoulder blocks were then placed in the usual fashion with appropriate precautions. A OG-tube was placed to suction. First timeout was performed to confirm the patient, procedure, antibiotic, allergy status, estimated blood loss and OR time. The perineum was then prepped in the usual fashion with Betadine. A 14 French Foley was inserted into the bladder under sterile conditions. A sterile speculum was placed in the vagina. The cervix was without lesions. The cervix was grasped with a single-tooth tenaculum. The dilator without difficulty. A ZUMI with a small Koe ring was placed without difficulty. The abdomen was then prepped with 2 Chlor prep sponges per protocol.   Patient was then draped after the prep was dried. Second timeout was performed to confirm the above. After again confirming OG tube placement and it was to suction. A  stab-wound was made in left upper quadrant 2 cm below the costal margin on the left in the midclavicular line. A 5 mm operative port was used to assure intra-abdominal placement. The abdomen was insufflated. At this point all points during the procedure the patient's intra-abdominal pressure was not increased over 15 mm of mercury. After insufflation was complete, the patient was placed in deep Trendelenburg position. 25 cm above the pubic symphysis that area was marked the camera port. Bilateral robotic ports were marked 10 cm from the midline incision at approximately 0 angle. A fourth port was placed in the LLQ. Under direct visualization each of the trochars was placed into the abdomen. The small bowel was folded on its mesentery to allow visualization to the pelvis. This was limited due to obesity but with creating an "apron" with the large redundant colon, we were able to keep the small bowel out of the pelvis sufficiently to safely perform the procedure. The 5 mm LUQ port was then converted to a 10/12 port under direct visualization.  After assuring adequate visualization, the robot was then docked in the usual fashion. Under direct visualization the robotic instruments replaced.   Washings were obtained and sent for cytology. The round ligament on the patient's right side was transected with monopolar cautery. The anterior and posterior leaves of the broad ligament were then taken down in the usual fashion. The ureter was identified on the medial leaf of the broad ligament. A window was made between the IP and the ureter. The IP was coagulated with bipolar cautery and transected >2 cm behind the visible portion of the ovary. The posterior leaf of the broad  ligament was taken down to the level of the KOH ring. The bladder flap was created using meticulous dissection and pinpoint cautery. The uterine vessels were coagulated with bipolar cautery. The uterine vessels were then transected and the C loop was  created. The same procedure was performed on the patient's left side.   The pneumo-occulder in the vagina was then insufflated. The colpotomy was then created in the usual fashion. The specimen was then delivered to the vagina. The vaginal cuff was closed with a running 0 Vicryl on CT 1 suture. The abdomen and pelvis were copiously irrigated and noted to be hemostatic. The robotic instruments were removed under direct visualization as were the robotic trochars. The pneumoperitoneum was removed. The patient was then taken out of the Trendelenburg position. Using of 0 Vicryl on a UR 6 needle the LUQ port fascia was closed after being grasped with allis clamps. The subcutaneous tissues of the port in the left upper quadrant was reapproximated. The skin was closed using 4-0 Vicryl. Steri-Strips and benzoin were applied. The pneumo occluded balloon was removed from the vagina. The vagina was swabbed and noted to be hemostatic.   All instrument needle and Ray-Tec counts were correct x2. The patient tolerated the procedure well and was taken to the recovery room in stable condition. This is Linda [name] dictating an operative note on patient Linda [name].

## 2016-06-14 NOTE — Anesthesia Preprocedure Evaluation (Addendum)
Anesthesia Evaluation  Patient identified by MRN, date of birth, ID band Patient awake    Reviewed: Allergy & Precautions, NPO status , Patient's Chart, lab work & pertinent test results  Airway Mallampati: III  TM Distance: >3 FB Neck ROM: Full    Dental  (+) Edentulous Upper, Dental Advisory Given   Pulmonary sleep apnea and Continuous Positive Airway Pressure Ventilation ,    breath sounds clear to auscultation- rhonchi       Cardiovascular hypertension, Pt. on medications  Rhythm:Regular Rate:Normal     Neuro/Psych PSYCHIATRIC DISORDERS Anxiety Depression negative neurological ROS     GI/Hepatic Neg liver ROS, GERD  Medicated,  Endo/Other  diabetes, Type 2Hypothyroidism   Renal/GU negative Renal ROS  negative genitourinary   Musculoskeletal  (+) Arthritis , Osteoarthritis,    Abdominal   Peds negative pediatric ROS (+)  Hematology negative hematology ROS (+)   Anesthesia Other Findings   Reproductive/Obstetrics negative OB ROS                            Lab Results  Component Value Date   WBC 8.6 06/13/2016   HGB 11.3 (L) 06/13/2016   HCT 37.3 06/13/2016   MCV 80.9 06/13/2016   PLT 115 (L) 06/13/2016   Lab Results  Component Value Date   CREATININE 0.92 06/13/2016   BUN 18 06/13/2016   NA 139 06/13/2016   K 3.8 06/13/2016   CL 100 (L) 06/13/2016   CO2 29 06/13/2016   No results found for: INR, PROTIME  EKG: normal sinus rhythm.   Anesthesia Physical Anesthesia Plan  ASA: III  Anesthesia Plan: General   Post-op Pain Management:    Induction: Intravenous  Airway Management Planned: Oral ETT  Additional Equipment:   Intra-op Plan:   Post-operative Plan: Extubation in OR  Informed Consent: I have reviewed the patients History and Physical, chart, labs and discussed the procedure including the risks, benefits and alternatives for the proposed anesthesia with  the patient or authorized representative who has indicated his/her understanding and acceptance.   Dental advisory given  Plan Discussed with: CRNA  Anesthesia Plan Comments:         Anesthesia Quick Evaluation

## 2016-06-14 NOTE — Anesthesia Postprocedure Evaluation (Addendum)
Anesthesia Post Note  Patient: ZAMIRA HICKAM  Procedure(s) Performed: Procedure(s) (LRB): ROBOTIC ASSISTED TOTAL HYSTERECTOMY WITH BILATERAL SALPINGO OOPHORECTOMY (Bilateral)  Patient location during evaluation: PACU Anesthesia Type: General Level of consciousness: awake and alert Pain management: pain level controlled Vital Signs Assessment: post-procedure vital signs reviewed and stable Respiratory status: spontaneous breathing, nonlabored ventilation, respiratory function stable and patient connected to nasal cannula oxygen Cardiovascular status: blood pressure returned to baseline and stable Postop Assessment: no signs of nausea or vomiting Anesthetic complications: no       Last Vitals:  Vitals:   06/14/16 1330 06/14/16 1345  BP: (!) 160/87 (!) 156/89  Pulse: 84 82  Resp: 11 10  Temp:  37 C    Last Pain:  Vitals:   06/14/16 1345  TempSrc:   PainSc: Asleep                 Effie Berkshire

## 2016-06-14 NOTE — H&P (View-Only) (Signed)
Consult Note: Gyn-Onc   Linda [name] 46 y.o. female  Chief Complaint  Patient presents with  . BRCA 1 Positive    Assessment :46-year-old white single female gravida 0 with a BRCA 1 mutation desiring risk reducing hysterectomy and bilateral salpingo-oophorectomy.  Plan: I had a good discussion with the patient and her stepmother regarding the implications of the BRCA 1 mutation. They're well aware that she is at significantly increased risk for both breast and ovarian cancer. They understand that time for screening test for ovarian cancer and therefore we would recommend salpingo-oophorectomy. Further, given the BRCA1 mutation she is also at high risk to develop papillary serous carcinoma of the endometrium. The patient wishes to proceed with laparoscopic hysterectomy and bilateral salpingo-oophorectomy. She understands the risks of surgery including hemorrhage, infection, injury to adjacent viscera, anesthetic risks, and thrombolic complications. She has other medical problems such as obesity and type 2 diabetes which may also increase the risk of surgery. Considering all of this patient does wish to go ahead with surgery as soon as possible and we will schedule it for next Tuesday. Patient understands and Dr. Paula Gehrig will be her surgeon and will meet Dr. Gehrig that morning. The postoperative recovery course was also reviewed with the patient and her stepmother. All other questions are answered.  Patient has not had a Pap smear nor years of Paps or is performed today.     HPI: 46-year-old white single female seen in consultation the request of Dr. Kelly Mosher who, because of a family history of breast cancer, has been tested for genetic mutations and was found to have a BRCA1 mutation. The patient denies any past gynecologic history. She went through menopause at age 39. She's not been on any hormone replacement therapy. She's never been pregnant. She has not had a Pap smear in separate  years. She denies any gynecologic symptoms including pelvic pain or pressure. She has no abdominal symptoms such as early satiety, bloating, distention, or pressure. Patient indicates that she has had a ultrasound of the pelvis which was normal although those records are not available.  Patient has a number of medical problems noted below. She has had an appendectomy and laparoscopic  cholecystectomy. Review of Systems:10 point review of systems is negative except as noted in interval history.   Vitals: Blood pressure (!) 161/85, pulse 92, temperature 98.2 F (36.8 C), temperature source Oral, resp. rate 20, height 5' 3.15" (1.604 m), weight 232 lb 12.8 oz (105.6 kg).  Physical Exam: General : The patient is a healthy woman in no acute distress.  HEENT: normocephalic, extraoccular movements normal; neck is supple without thyromegally  Lynphnodes: Supraclavicular and inguinal nodes not enlarged  Abdomen:  Obese,Soft, non-tender, no ascites, no organomegally, no masses, no hernias  Pelvic:  EGBUS: Normal female  Vagina: Normal, no lesions  Urethra and Bladder: Normal, non-tender  Cervix: appears nulliparous. Uterus: difficult to evaluate secondary to the patient's habitus.   Bi-manual examination: Non-tender; no adenxal masses or nodularity  Rectal: normal sphincter tone, no masses, no blood  Lower extremities: No edema or varicosities. Normal range of motion      Allergies  Allergen Reactions  . Celecoxib Other (See Comments)    ask  . Codeine Other (See Comments)    ask  . Ezetimibe-Simvastatin Other (See Comments)    ask  . Propranolol Hcl Other (See Comments)    ask    No past medical history on file.  No past surgical history on   file.  Current Outpatient Prescriptions  Medication Sig Dispense Refill  . pravastatin (PRAVACHOL) 20 MG tablet     . buPROPion (WELLBUTRIN XL) 300 MG 24 hr tablet Take 300 mg by mouth.    . clotrimazole-betamethasone (LOTRISONE) cream Apply  topically.    . Dulaglutide 1.5 MG/0.5ML SOPN Inject 1.5 mg into the skin.    . famotidine (PEPCID) 20 MG tablet Take 20 mg by mouth.    . FETZIMA 80 MG CP24 Take 1 capsule by mouth daily.  0  . gabapentin (NEURONTIN) 300 MG capsule Take 300 mg by mouth.    . LANTUS SOLOSTAR 100 UNIT/ML Solostar Pen INJECT 68 UNITS ONCE DAILY  3  . levothyroxine (SYNTHROID, LEVOTHROID) 75 MCG tablet Take by mouth.    . lidocaine (XYLOCAINE) 2 % solution 15 mLs.    . lisinopril (PRINIVIL,ZESTRIL) 10 MG tablet Take 10 mg by mouth.    . losartan (COZAAR) 25 MG tablet Take 25 mg by mouth.    . metFORMIN (GLUCOPHAGE) 1000 MG tablet Take 1,000 mg by mouth.    . methocarbamol (ROBAXIN) 500 MG tablet Take 500 mg by mouth.    . morphine (KADIAN) 10 MG 24 hr capsule Take 10 mg by mouth.    . morphine (MSIR) 15 MG tablet TAKE ONE TABLET BY MOUTH AS NEEDED FOR SEVERE breakthrough pain  0  . pantoprazole (PROTONIX) 40 MG tablet Take 40 mg by mouth.    . potassium chloride (MICRO-K) 10 MEQ CR capsule Take by mouth.    . prochlorperazine (COMPAZINE) 10 MG tablet Take 10 mg by mouth.    . sucralfate (CARAFATE) 1 g tablet TAKE ONE TABLET BY MOUTH FOUR TIMES DAILY 30 MINUTES BEFORE MEALS AND AT BEDTIME  12  . Vitamin D, Ergocalciferol, (DRISDOL) 50000 units CAPS capsule Take by mouth.    . zolpidem (AMBIEN) 10 MG tablet Take 10 mg by mouth.     No current facility-administered medications for this visit.     Social History   Social History  . Marital status: Married    Spouse name: N/A  . Number of children: N/A  . Years of education: N/A   Occupational History  . Not on file.   Social History Main Topics  . Smoking status: Not on file  . Smokeless tobacco: Not on file  . Alcohol use Not on file  . Drug use: Unknown  . Sexual activity: Not on file   Other Topics Concern  . Not on file   Social History Narrative  . No narrative on file    No family history on file.     Clarke-Pearson,  MD 06/10/2016, 8:45 AM       

## 2016-06-14 NOTE — Anesthesia Procedure Notes (Addendum)
Procedure Name: Intubation Date/Time: 06/14/2016 10:27 AM Performed by: Noralyn Pick D Pre-anesthesia Checklist: Patient identified, Emergency Drugs available, Suction available and Patient being monitored Patient Re-evaluated:Patient Re-evaluated prior to inductionOxygen Delivery Method: Circle system utilized Preoxygenation: Pre-oxygenation with 100% oxygen Intubation Type: IV induction Ventilation: Oral airway inserted - appropriate to patient size and Two handed mask ventilation required Laryngoscope Size: Mac and 4 Grade View: Grade II Tube type: Oral Laser Tube: Cuffed inflated with minimal occlusive pressure - saline Tube size: 7.5 mm Number of attempts: 1 Airway Equipment and Method: Stylet Placement Confirmation: ETT inserted through vocal cords under direct vision,  positive ETCO2 and breath sounds checked- equal and bilateral Secured at: 21 cm Tube secured with: Tape Dental Injury: Teeth and Oropharynx as per pre-operative assessment

## 2016-06-14 NOTE — Transfer of Care (Signed)
Immediate Anesthesia Transfer of Care Note  Patient: Linda [name]  Procedure(s) Performed: Procedure(s): ROBOTIC ASSISTED TOTAL HYSTERECTOMY WITH BILATERAL SALPINGO OOPHORECTOMY (Bilateral)  Patient Location: PACU  Anesthesia Type:General  Level of Consciousness: awake and patient cooperative  Airway & Oxygen Therapy: Patient Spontanous Breathing and Patient connected to face mask oxygen  Post-op Assessment: Report given to RN and Post -op Vital signs reviewed and stable  Post vital signs: Reviewed and stable  Last Vitals:  Vitals:   06/14/16 0835  BP: 134/80  Pulse: 92  Resp: 16  Temp: 37.1 C    Last Pain:  Vitals:   06/14/16 0835  TempSrc: Oral      Patients Stated Pain Goal: 5 (28/67/51 9824)  Complications: No apparent anesthesia complications

## 2016-06-15 ENCOUNTER — Encounter (HOSPITAL_COMMUNITY): Payer: Self-pay | Admitting: Gynecologic Oncology

## 2016-06-15 DIAGNOSIS — Z4002 Encounter for prophylactic removal of ovary: Secondary | ICD-10-CM | POA: Diagnosis not present

## 2016-06-15 LAB — BASIC METABOLIC PANEL
ANION GAP: 8 (ref 5–15)
BUN: 15 mg/dL (ref 6–20)
CHLORIDE: 102 mmol/L (ref 101–111)
CO2: 28 mmol/L (ref 22–32)
CREATININE: 0.84 mg/dL (ref 0.44–1.00)
Calcium: 8.9 mg/dL (ref 8.9–10.3)
GFR calc non Af Amer: >60 mL/min (ref >60)
GLUCOSE: 222 mg/dL — AB (ref 65–99)
Potassium: 4.1 mmol/L (ref 3.5–5.1)
Sodium: 138 mmol/L (ref 135–145)

## 2016-06-15 LAB — CBC
HEMATOCRIT: 35.7 % — AB (ref 36.0–46.0)
Hemoglobin: 10.8 g/dL — ABNORMAL LOW (ref 12.0–15.0)
MCH: 24.4 pg — ABNORMAL LOW (ref 26.0–34.0)
MCHC: 30.3 g/dL (ref 30.0–36.0)
MCV: 80.6 fL (ref 78.0–100.0)
Platelets: 114 10*3/uL — ABNORMAL LOW (ref 150–400)
RBC: 4.43 MIL/uL (ref 3.87–5.11)
RDW: 14.5 % (ref 11.5–15.5)
WBC: 10.9 10*3/uL — AB (ref 4.0–10.5)

## 2016-06-15 LAB — GLUCOSE, CAPILLARY
Glucose-Capillary: 194 mg/dL — ABNORMAL HIGH (ref 65–99)
Glucose-Capillary: 183 mg/dL — ABNORMAL HIGH (ref 65–99)

## 2016-06-15 MED ORDER — ACETAMINOPHEN 500 MG PO TABS
1000.0000 mg | ORAL_TABLET | Freq: Four times a day (QID) | ORAL | Status: DC
  Administered 2016-06-15: 1000 mg via ORAL
  Filled 2016-06-15: qty 2

## 2016-06-15 NOTE — Discharge Instructions (Signed)
06/15/2016  Return to work: 4-6 weeks if applicable  Activity: 1. Be up and out of the bed during the day.  Take a nap if needed.  You may walk up steps but be careful and use the hand rail.  Stair climbing will tire you more than you think, you may need to stop part way and rest.   2. No lifting or straining for 6 weeks.  3. No driving for 1 week(s).  Do not drive if you are taking narcotic pain medicine.  4. Shower daily.  Use soap and water on your incision and pat dry; don't rub.  No tub baths until cleared by your surgeon.   5. No sexual activity and nothing in the vagina for 8 weeks.  6. You may experience a small amount of clear drainage from your incisions, which is normal.  If the drainage persists or increases, please call the office.  7. You may experience vaginal spotting after surgery or around the 6-8 week mark from surgery when the stitches at the top of the vagina begin to dissolve.  The spotting is normal but if you experience heavy bleeding, call our office.  Diet: 1. Low sodium Heart Healthy Diet is recommended.  2. It is safe to use a laxative, such as Miralax or Colace, if you have difficulty moving your bowels.   Wound Care: 1. Keep clean and dry.  Shower daily.  Reasons to call the Doctor:  Fever - Oral temperature greater than 100.4 degrees Fahrenheit  Foul-smelling vaginal discharge  Difficulty urinating  Nausea and vomiting  Increased pain at the site of the incision that is unrelieved with pain medicine.  Difficulty breathing with or without chest pain  New calf pain especially if only on one side  Sudden, continuing increased vaginal bleeding with or without clots.   Contacts: For questions or concerns you should contact:  Dr. Everitt Amber at [phone]  Joylene John, NP at [phone]  After Hours: call [phone] and have the GYN Oncologist paged/contacted

## 2016-06-15 NOTE — Progress Notes (Signed)
Patient was given discharge instructions, prescriptions, and all questions were answered. Patient was taken to main entrance via wheelchair by NT.

## 2016-06-15 NOTE — Discharge Summary (Signed)
Physician Discharge Summary  Patient ID: Linda [name] MRN: [id number] DOB/AGE: 48-22-1972 48 y.o.  Admit date: 06/14/2016 Discharge date: 06/15/2016  Admission Diagnoses: BRCA1 positive  Discharge Diagnoses:  Principal Problem:   BRCA1 positive   Discharged Condition:  The patient is in good condition and stable for discharge.    Hospital Course: On 06/14/2016, the patient underwent the following: Procedure(s): ROBOTIC ASSISTED TOTAL HYSTERECTOMY WITH BILATERAL SALPINGO OOPHORECTOMY.   The postoperative course was uneventful.  She was discharged to home on postoperative day 1 tolerating a regular diet, ambulating, voiding, passing flatus, minimal pain.  Consults: None  Significant Diagnostic Studies: None  Treatments: surgery: see above  Discharge Exam: Blood pressure 119/63, pulse 82, temperature 98.4 F (36.9 C), temperature source Oral, resp. rate 16, height 5' 2"  (1.575 m), weight 226 lb (102.5 kg), SpO2 95 %. General appearance: alert, cooperative and no distress Resp: clear to auscultation bilaterally Cardio: regular rate and rhythm, S1, S2 normal, no murmur, click, rub or gallop GI: soft, non-tender; bowel sounds normal; no masses,  no organomegaly Extremities: extremities normal, atraumatic, no cyanosis or edema Incision/Wound: Lap sites to the abdomen with steri strips intact without erythema or drainage  Disposition:   Discharge Instructions    Call MD for:  difficulty breathing, headache or visual disturbances    Complete by:  As directed    Call MD for:  extreme fatigue    Complete by:  As directed    Call MD for:  hives    Complete by:  As directed    Call MD for:  persistant dizziness or light-headedness    Complete by:  As directed    Call MD for:  persistant nausea and vomiting    Complete by:  As directed    Call MD for:  redness, tenderness, or signs of infection (pain, swelling, redness, odor or green/yellow discharge around incision site)    Complete  by:  As directed    Call MD for:  severe uncontrolled pain    Complete by:  As directed    Call MD for:  temperature >100.4    Complete by:  As directed    Diet - low sodium heart healthy    Complete by:  As directed    Driving Restrictions    Complete by:  As directed    No driving for 1 week.  Do not take narcotics and drive.   Increase activity slowly    Complete by:  As directed    Lifting restrictions    Complete by:  As directed    No lifting greater than 10 lbs.   Sexual Activity Restrictions    Complete by:  As directed    No sexual activity, nothing in the vagina, for 8 weeks.     Allergies as of 06/15/2016      Reactions   Codeine Shortness Of Breath   Celecoxib Other (See Comments)   Patient is not aware of an allergy to this medication   Ezetimibe-simvastatin Other (See Comments)   Patient is not aware of an allergy to this medication   Propranolol Hcl Other (See Comments)   Patient is not aware of an allergy to this medication      Medication List    STOP taking these medications   FERROCITE 324 (106 Fe) MG Tabs tablet Generic drug:  Ferrous Fumarate     TAKE these medications   buPROPion 300 MG 24 hr tablet Commonly known as:  WELLBUTRIN XL Take  300 mg by mouth daily.   clotrimazole-betamethasone cream Commonly known as:  LOTRISONE Apply 1 application topically daily as needed (rash).   famotidine 20 MG tablet Commonly known as:  PEPCID Take 20 mg by mouth 2 (two) times daily.   FETZIMA 80 MG Cp24 Generic drug:  Levomilnacipran HCl ER Take 80 capsules by mouth daily.   gabapentin 300 MG capsule Commonly known as:  NEURONTIN Take 300 mg by mouth 3 (three) times daily.   LANTUS SOLOSTAR 100 UNIT/ML Solostar Pen Generic drug:  Insulin Glargine INJECT 68 UNITS ONCE DAILY   levothyroxine 75 MCG tablet Commonly known as:  SYNTHROID, LEVOTHROID Take 75 mcg by mouth daily before breakfast.   lidocaine 2 % solution Commonly known as:   XYLOCAINE Use as directed 15 mLs in the mouth or throat every 6 (six) hours as needed for mouth pain.   losartan 25 MG tablet Commonly known as:  COZAAR Take 25 mg by mouth daily.   metFORMIN 1000 MG tablet Commonly known as:  GLUCOPHAGE Take 1,000 mg by mouth 2 (two) times daily with a meal.   methocarbamol 500 MG tablet Commonly known as:  ROBAXIN Take 500 mg by mouth 2 (two) times daily.   morphine 30 MG 12 hr tablet Commonly known as:  MS CONTIN Take 30 mg by mouth every 12 (twelve) hours.   morphine 15 MG tablet Commonly known as:  MSIR TAKE ONE TABLET BY MOUTH EVERY 6 HOURS AS NEEDED FOR SEVERE breakthrough pain   pantoprazole 40 MG tablet Commonly known as:  PROTONIX Take 40 mg by mouth 2 (two) times daily.   potassium chloride 10 MEQ CR capsule Commonly known as:  MICRO-K Take 20 mEq by mouth 2 (two) times daily.   pravastatin 20 MG tablet Commonly known as:  PRAVACHOL Take 20 mg by mouth daily.   prochlorperazine 10 MG tablet Commonly known as:  COMPAZINE Take 10 mg by mouth every 6 (six) hours as needed for nausea or vomiting.   sucralfate 1 g tablet Commonly known as:  CARAFATE TAKE ONE TABLET BY MOUTH FOUR TIMES DAILY 30 MINUTES BEFORE MEALS AND AT BEDTIME   TRULICITY 0.81 NG/8.7JL Sopn Generic drug:  Dulaglutide Inject 0.75 mg into the skin every 7 (seven) days. Saturdays   Vitamin D (Ergocalciferol) 50000 units Caps capsule Commonly known as:  DRISDOL Take 50,000 Units by mouth every 7 (seven) days. Fridays   zolpidem 10 MG tablet Commonly known as:  AMBIEN Take 10 mg by mouth at bedtime.        Greater than thirty minutes were spend for face to face discharge instructions and discharge orders/summary in EPIC.   Signed: Madsen Riddle DEAL 06/15/2016, 12:43 PM

## 2016-06-16 LAB — CYTOLOGY - PAP: Diagnosis: NEGATIVE

## 2016-06-17 ENCOUNTER — Telehealth: Payer: Self-pay

## 2016-06-17 ENCOUNTER — Telehealth: Payer: Self-pay | Admitting: Gynecologic Oncology

## 2016-06-17 NOTE — Telephone Encounter (Signed)
LM in voice mail to call back to discuss results of pathology results from surgery 06-14-16 with Dr. Alycia Rossetti. Pathology normal per Joylene John, NP.

## 2016-06-17 NOTE — Telephone Encounter (Signed)
TC to patient to check on status and to discuss results. No answer and LM. PG

## 2016-06-17 NOTE — Telephone Encounter (Signed)
Pt called and stated that Dr. Alycia Rossetti called her today with the pathology results.

## 2016-06-22 DIAGNOSIS — F320 Major depressive disorder, single episode, mild: Secondary | ICD-10-CM | POA: Diagnosis not present

## 2016-06-22 DIAGNOSIS — E1121 Type 2 diabetes mellitus with diabetic nephropathy: Secondary | ICD-10-CM | POA: Diagnosis not present

## 2016-06-22 DIAGNOSIS — E1142 Type 2 diabetes mellitus with diabetic polyneuropathy: Secondary | ICD-10-CM | POA: Diagnosis not present

## 2016-06-22 DIAGNOSIS — E782 Mixed hyperlipidemia: Secondary | ICD-10-CM | POA: Diagnosis not present

## 2016-06-22 DIAGNOSIS — E559 Vitamin D deficiency, unspecified: Secondary | ICD-10-CM | POA: Diagnosis not present

## 2016-06-22 DIAGNOSIS — Z6841 Body Mass Index (BMI) 40.0 and over, adult: Secondary | ICD-10-CM | POA: Diagnosis not present

## 2016-06-22 DIAGNOSIS — I10 Essential (primary) hypertension: Secondary | ICD-10-CM | POA: Diagnosis not present

## 2016-06-24 NOTE — Addendum Note (Signed)
Addendum  created 06/24/16 1325 by Effie Berkshire, MD   Sign clinical note

## 2016-06-27 ENCOUNTER — Other Ambulatory Visit: Payer: PPO

## 2016-07-06 ENCOUNTER — Encounter: Payer: Self-pay | Admitting: Gynecologic Oncology

## 2016-07-06 ENCOUNTER — Ambulatory Visit: Payer: PPO | Attending: Gynecologic Oncology | Admitting: Gynecologic Oncology

## 2016-07-06 VITALS — BP 159/79 | HR 93 | Temp 98.5°F | Resp 20 | Wt 223.3 lb

## 2016-07-06 DIAGNOSIS — Z7189 Other specified counseling: Secondary | ICD-10-CM

## 2016-07-06 DIAGNOSIS — Z1509 Genetic susceptibility to other malignant neoplasm: Secondary | ICD-10-CM | POA: Diagnosis not present

## 2016-07-06 DIAGNOSIS — Z1502 Genetic susceptibility to malignant neoplasm of ovary: Secondary | ICD-10-CM

## 2016-07-06 DIAGNOSIS — Z90710 Acquired absence of both cervix and uterus: Secondary | ICD-10-CM | POA: Diagnosis not present

## 2016-07-06 DIAGNOSIS — Z1501 Genetic susceptibility to malignant neoplasm of breast: Secondary | ICD-10-CM

## 2016-07-06 DIAGNOSIS — Z9889 Other specified postprocedural states: Secondary | ICD-10-CM | POA: Insufficient documentation

## 2016-07-06 NOTE — Progress Notes (Signed)
FOLLOW UP VISIT 07/06/16  HPI:  Linda [name] is a 46 y.o. year old No obstetric history on file. initially seen in consultation on 06/10/16 referred by Dr Georgann Housekeeper for BRCA 1 germline mutation.  She then underwent a robotic hysterectomy, BSO on 3/41/93 without complications.  Her postoperative course was uncomplicated.  Her final pathology revealed benign uterus, tubes and ovaries.  She is seen today for a postoperative check and to discuss her pathology results and ongoing plan.  Since discharge from the hospital, she is feeling well.  She has improving appetite, normal bowel and bladder function, and pain controlled with minimal PO medication. She has no other complaints today.  Review of systems: Constitutional:  She has no weight gain or weight loss. She has no fever or chills. Eyes: No blurred vision Ears, Nose, Mouth, Throat: No dizziness, headaches or changes in hearing. No mouth sores. Cardiovascular: No chest pain, palpitations or edema. Respiratory:  No shortness of breath, wheezing or cough Gastrointestinal: She has normal bowel movements without diarrhea or constipation. She denies any nausea or vomiting. She denies blood in her stool or heart burn. Genitourinary:  She denies pelvic pain, pelvic pressure or changes in her urinary function. She has no hematuria, dysuria, or incontinence. She has no irregular vaginal bleeding or vaginal discharge Musculoskeletal: Denies muscle weakness or joint pains.  Skin:  She has no skin changes, rashes or itching Neurological:  Denies dizziness or headaches. No neuropathy, no numbness or tingling. Psychiatric:  She denies depression or anxiety. Hematologic/Lymphatic:   No easy bruising or bleeding   Physical Exam: Blood pressure (!) 159/79, pulse 93, temperature 98.5 F (36.9 C), resp. rate 20, weight 223 lb 4.8 oz (101.3 kg), SpO2 99 %. General: Well dressed, well nourished in no apparent distress.   HEENT:  Normocephalic and atraumatic, no  lesions.  Extraocular muscles intact. Sclerae anicteric. Pupils equal, round, reactive. No mouth sores or ulcers. Thyroid is normal size, not nodular, midline. Abdomen:  Soft, nontender, nondistended.  No palpable masses.  No hepatosplenomegaly.  No ascites. Normal bowel sounds.  No hernias.  Incisions are well healed - residual suture removed  Genitourinary: Normal EGBUS  Vaginal cuff intact.  No bleeding or discharge.  No cul de sac fullness. Extremities: No cyanosis, clubbing or edema.  No calf tenderness or erythema. No palpable cords. Psychiatric: Mood and affect are appropriate. Neurological: Awake, alert and oriented x 3. Sensation is intact, no neuropathy.  Musculoskeletal: No pain, normal strength and range of motion.  Assessment:    46 y.o. year old with with a history of BRCA 1 germline mutation.   S/p robotic assisted total hysterectomy, BSO on 06/14/16.   Plan: 1) Pathology reports reviewed today 2) Treatment counseling - I discussed the benign findings on pathology. Follow-up with PCP or OBGYN for annual well-woman care is recommended. She was given the opportunity to ask questions, which were answered to her satisfaction, and she is agreement with the above mentioned plan of care.  3)  Return to clinic on a prn basis  Donaciano Eva, MD

## 2016-07-06 NOTE — Patient Instructions (Signed)
Plan to follow up with Dr. Tobie Poet.  Call for any questions or concerns.

## 2016-08-08 DIAGNOSIS — D500 Iron deficiency anemia secondary to blood loss (chronic): Secondary | ICD-10-CM | POA: Diagnosis not present

## 2016-08-11 DIAGNOSIS — D500 Iron deficiency anemia secondary to blood loss (chronic): Secondary | ICD-10-CM | POA: Diagnosis not present

## 2016-08-11 DIAGNOSIS — K2961 Other gastritis with bleeding: Secondary | ICD-10-CM | POA: Diagnosis not present

## 2016-08-11 DIAGNOSIS — R195 Other fecal abnormalities: Secondary | ICD-10-CM | POA: Diagnosis not present

## 2016-08-17 DIAGNOSIS — E119 Type 2 diabetes mellitus without complications: Secondary | ICD-10-CM | POA: Diagnosis not present

## 2016-10-20 DIAGNOSIS — D696 Thrombocytopenia, unspecified: Secondary | ICD-10-CM | POA: Diagnosis not present

## 2016-10-20 DIAGNOSIS — D509 Iron deficiency anemia, unspecified: Secondary | ICD-10-CM | POA: Diagnosis not present

## 2016-10-20 DIAGNOSIS — Z1501 Genetic susceptibility to malignant neoplasm of breast: Secondary | ICD-10-CM | POA: Diagnosis not present

## 2016-10-20 DIAGNOSIS — Z79810 Long term (current) use of selective estrogen receptor modulators (SERMs): Secondary | ICD-10-CM | POA: Diagnosis not present

## 2016-10-20 DIAGNOSIS — D693 Immune thrombocytopenic purpura: Secondary | ICD-10-CM | POA: Diagnosis not present

## 2016-10-27 DIAGNOSIS — M545 Low back pain: Secondary | ICD-10-CM | POA: Diagnosis not present

## 2016-10-27 DIAGNOSIS — E1142 Type 2 diabetes mellitus with diabetic polyneuropathy: Secondary | ICD-10-CM | POA: Diagnosis not present

## 2016-10-27 DIAGNOSIS — E782 Mixed hyperlipidemia: Secondary | ICD-10-CM | POA: Diagnosis not present

## 2016-10-27 DIAGNOSIS — Z79899 Other long term (current) drug therapy: Secondary | ICD-10-CM | POA: Diagnosis not present

## 2016-10-27 DIAGNOSIS — E1121 Type 2 diabetes mellitus with diabetic nephropathy: Secondary | ICD-10-CM | POA: Diagnosis not present

## 2016-10-27 DIAGNOSIS — F320 Major depressive disorder, single episode, mild: Secondary | ICD-10-CM | POA: Diagnosis not present

## 2016-10-27 DIAGNOSIS — I10 Essential (primary) hypertension: Secondary | ICD-10-CM | POA: Diagnosis not present

## 2016-10-27 DIAGNOSIS — E559 Vitamin D deficiency, unspecified: Secondary | ICD-10-CM | POA: Diagnosis not present

## 2016-10-27 DIAGNOSIS — G894 Chronic pain syndrome: Secondary | ICD-10-CM | POA: Diagnosis not present

## 2016-10-27 DIAGNOSIS — Z23 Encounter for immunization: Secondary | ICD-10-CM | POA: Diagnosis not present

## 2016-10-27 DIAGNOSIS — Z6841 Body Mass Index (BMI) 40.0 and over, adult: Secondary | ICD-10-CM | POA: Diagnosis not present

## 2016-12-20 DIAGNOSIS — D500 Iron deficiency anemia secondary to blood loss (chronic): Secondary | ICD-10-CM | POA: Diagnosis not present

## 2016-12-22 DIAGNOSIS — D500 Iron deficiency anemia secondary to blood loss (chronic): Secondary | ICD-10-CM | POA: Diagnosis not present

## 2016-12-22 DIAGNOSIS — K2961 Other gastritis with bleeding: Secondary | ICD-10-CM | POA: Diagnosis not present

## 2016-12-22 DIAGNOSIS — R195 Other fecal abnormalities: Secondary | ICD-10-CM | POA: Diagnosis not present

## 2017-02-07 DIAGNOSIS — Z6841 Body Mass Index (BMI) 40.0 and over, adult: Secondary | ICD-10-CM | POA: Diagnosis not present

## 2017-02-07 DIAGNOSIS — E782 Mixed hyperlipidemia: Secondary | ICD-10-CM | POA: Diagnosis not present

## 2017-02-07 DIAGNOSIS — E1121 Type 2 diabetes mellitus with diabetic nephropathy: Secondary | ICD-10-CM | POA: Diagnosis not present

## 2017-02-07 DIAGNOSIS — E1142 Type 2 diabetes mellitus with diabetic polyneuropathy: Secondary | ICD-10-CM | POA: Diagnosis not present

## 2017-02-07 DIAGNOSIS — G894 Chronic pain syndrome: Secondary | ICD-10-CM | POA: Diagnosis not present

## 2017-02-07 DIAGNOSIS — F320 Major depressive disorder, single episode, mild: Secondary | ICD-10-CM | POA: Diagnosis not present

## 2017-02-07 DIAGNOSIS — E559 Vitamin D deficiency, unspecified: Secondary | ICD-10-CM | POA: Diagnosis not present

## 2017-02-07 DIAGNOSIS — I10 Essential (primary) hypertension: Secondary | ICD-10-CM | POA: Diagnosis not present

## 2017-02-07 DIAGNOSIS — M545 Low back pain: Secondary | ICD-10-CM | POA: Diagnosis not present

## 2017-05-03 DIAGNOSIS — D500 Iron deficiency anemia secondary to blood loss (chronic): Secondary | ICD-10-CM | POA: Diagnosis not present

## 2017-05-18 DIAGNOSIS — R195 Other fecal abnormalities: Secondary | ICD-10-CM | POA: Diagnosis not present

## 2017-05-18 DIAGNOSIS — D500 Iron deficiency anemia secondary to blood loss (chronic): Secondary | ICD-10-CM | POA: Diagnosis not present

## 2017-05-18 DIAGNOSIS — K2961 Other gastritis with bleeding: Secondary | ICD-10-CM | POA: Diagnosis not present

## 2017-05-23 DIAGNOSIS — F320 Major depressive disorder, single episode, mild: Secondary | ICD-10-CM | POA: Diagnosis not present

## 2017-05-23 DIAGNOSIS — M545 Low back pain: Secondary | ICD-10-CM | POA: Diagnosis not present

## 2017-05-23 DIAGNOSIS — H1013 Acute atopic conjunctivitis, bilateral: Secondary | ICD-10-CM | POA: Diagnosis not present

## 2017-05-23 DIAGNOSIS — E038 Other specified hypothyroidism: Secondary | ICD-10-CM | POA: Diagnosis not present

## 2017-05-23 DIAGNOSIS — E1142 Type 2 diabetes mellitus with diabetic polyneuropathy: Secondary | ICD-10-CM | POA: Diagnosis not present

## 2017-05-23 DIAGNOSIS — E782 Mixed hyperlipidemia: Secondary | ICD-10-CM | POA: Diagnosis not present

## 2017-05-23 DIAGNOSIS — I10 Essential (primary) hypertension: Secondary | ICD-10-CM | POA: Diagnosis not present

## 2017-05-23 DIAGNOSIS — Z6839 Body mass index (BMI) 39.0-39.9, adult: Secondary | ICD-10-CM | POA: Diagnosis not present

## 2017-05-23 DIAGNOSIS — G894 Chronic pain syndrome: Secondary | ICD-10-CM | POA: Diagnosis not present

## 2017-05-23 DIAGNOSIS — E559 Vitamin D deficiency, unspecified: Secondary | ICD-10-CM | POA: Diagnosis not present

## 2017-05-23 DIAGNOSIS — E1121 Type 2 diabetes mellitus with diabetic nephropathy: Secondary | ICD-10-CM | POA: Diagnosis not present

## 2017-06-01 DIAGNOSIS — Z1231 Encounter for screening mammogram for malignant neoplasm of breast: Secondary | ICD-10-CM | POA: Diagnosis not present

## 2017-06-06 DIAGNOSIS — L03313 Cellulitis of chest wall: Secondary | ICD-10-CM | POA: Diagnosis not present

## 2017-06-06 DIAGNOSIS — D693 Immune thrombocytopenic purpura: Secondary | ICD-10-CM | POA: Diagnosis not present

## 2017-06-06 DIAGNOSIS — Z1501 Genetic susceptibility to malignant neoplasm of breast: Secondary | ICD-10-CM | POA: Diagnosis not present

## 2017-06-06 DIAGNOSIS — Z1502 Genetic susceptibility to malignant neoplasm of ovary: Secondary | ICD-10-CM | POA: Diagnosis not present

## 2017-06-30 DIAGNOSIS — E1142 Type 2 diabetes mellitus with diabetic polyneuropathy: Secondary | ICD-10-CM | POA: Diagnosis not present

## 2017-06-30 DIAGNOSIS — L089 Local infection of the skin and subcutaneous tissue, unspecified: Secondary | ICD-10-CM | POA: Diagnosis not present

## 2017-06-30 DIAGNOSIS — L0889 Other specified local infections of the skin and subcutaneous tissue: Secondary | ICD-10-CM | POA: Diagnosis not present

## 2017-06-30 DIAGNOSIS — M79672 Pain in left foot: Secondary | ICD-10-CM | POA: Diagnosis not present

## 2017-07-03 DIAGNOSIS — L03032 Cellulitis of left toe: Secondary | ICD-10-CM | POA: Diagnosis not present

## 2017-07-18 DIAGNOSIS — L03032 Cellulitis of left toe: Secondary | ICD-10-CM | POA: Diagnosis not present

## 2017-07-26 DIAGNOSIS — E11621 Type 2 diabetes mellitus with foot ulcer: Secondary | ICD-10-CM | POA: Diagnosis not present

## 2017-07-26 DIAGNOSIS — E1142 Type 2 diabetes mellitus with diabetic polyneuropathy: Secondary | ICD-10-CM | POA: Diagnosis not present

## 2017-07-26 DIAGNOSIS — L97522 Non-pressure chronic ulcer of other part of left foot with fat layer exposed: Secondary | ICD-10-CM | POA: Diagnosis not present

## 2017-08-16 DIAGNOSIS — L97522 Non-pressure chronic ulcer of other part of left foot with fat layer exposed: Secondary | ICD-10-CM | POA: Diagnosis not present

## 2017-08-16 DIAGNOSIS — E1142 Type 2 diabetes mellitus with diabetic polyneuropathy: Secondary | ICD-10-CM | POA: Diagnosis not present

## 2017-08-16 DIAGNOSIS — E11621 Type 2 diabetes mellitus with foot ulcer: Secondary | ICD-10-CM | POA: Diagnosis not present

## 2017-08-30 DIAGNOSIS — E782 Mixed hyperlipidemia: Secondary | ICD-10-CM | POA: Diagnosis not present

## 2017-08-30 DIAGNOSIS — F320 Major depressive disorder, single episode, mild: Secondary | ICD-10-CM | POA: Diagnosis not present

## 2017-08-30 DIAGNOSIS — M545 Low back pain: Secondary | ICD-10-CM | POA: Diagnosis not present

## 2017-08-30 DIAGNOSIS — I10 Essential (primary) hypertension: Secondary | ICD-10-CM | POA: Diagnosis not present

## 2017-08-30 DIAGNOSIS — Z6839 Body mass index (BMI) 39.0-39.9, adult: Secondary | ICD-10-CM | POA: Diagnosis not present

## 2017-08-30 DIAGNOSIS — G894 Chronic pain syndrome: Secondary | ICD-10-CM | POA: Diagnosis not present

## 2017-08-30 DIAGNOSIS — D6949 Other primary thrombocytopenia: Secondary | ICD-10-CM | POA: Diagnosis not present

## 2017-08-30 DIAGNOSIS — E038 Other specified hypothyroidism: Secondary | ICD-10-CM | POA: Diagnosis not present

## 2017-08-30 DIAGNOSIS — E1121 Type 2 diabetes mellitus with diabetic nephropathy: Secondary | ICD-10-CM | POA: Diagnosis not present

## 2017-08-30 DIAGNOSIS — E559 Vitamin D deficiency, unspecified: Secondary | ICD-10-CM | POA: Diagnosis not present

## 2017-08-30 DIAGNOSIS — E1142 Type 2 diabetes mellitus with diabetic polyneuropathy: Secondary | ICD-10-CM | POA: Diagnosis not present

## 2017-09-13 DIAGNOSIS — L97522 Non-pressure chronic ulcer of other part of left foot with fat layer exposed: Secondary | ICD-10-CM | POA: Diagnosis not present

## 2017-09-13 DIAGNOSIS — E11621 Type 2 diabetes mellitus with foot ulcer: Secondary | ICD-10-CM | POA: Diagnosis not present

## 2017-09-13 DIAGNOSIS — E1142 Type 2 diabetes mellitus with diabetic polyneuropathy: Secondary | ICD-10-CM | POA: Diagnosis not present

## 2017-09-22 DIAGNOSIS — D500 Iron deficiency anemia secondary to blood loss (chronic): Secondary | ICD-10-CM | POA: Diagnosis not present

## 2017-09-28 DIAGNOSIS — R195 Other fecal abnormalities: Secondary | ICD-10-CM | POA: Diagnosis not present

## 2017-09-28 DIAGNOSIS — K2961 Other gastritis with bleeding: Secondary | ICD-10-CM | POA: Diagnosis not present

## 2017-09-28 DIAGNOSIS — D500 Iron deficiency anemia secondary to blood loss (chronic): Secondary | ICD-10-CM | POA: Diagnosis not present

## 2017-12-04 DIAGNOSIS — M545 Low back pain: Secondary | ICD-10-CM | POA: Diagnosis not present

## 2017-12-04 DIAGNOSIS — F320 Major depressive disorder, single episode, mild: Secondary | ICD-10-CM | POA: Diagnosis not present

## 2017-12-04 DIAGNOSIS — E1142 Type 2 diabetes mellitus with diabetic polyneuropathy: Secondary | ICD-10-CM | POA: Diagnosis not present

## 2017-12-04 DIAGNOSIS — E038 Other specified hypothyroidism: Secondary | ICD-10-CM | POA: Diagnosis not present

## 2017-12-04 DIAGNOSIS — Z6839 Body mass index (BMI) 39.0-39.9, adult: Secondary | ICD-10-CM | POA: Diagnosis not present

## 2017-12-04 DIAGNOSIS — Z23 Encounter for immunization: Secondary | ICD-10-CM | POA: Diagnosis not present

## 2017-12-04 DIAGNOSIS — E559 Vitamin D deficiency, unspecified: Secondary | ICD-10-CM | POA: Diagnosis not present

## 2017-12-04 DIAGNOSIS — E1121 Type 2 diabetes mellitus with diabetic nephropathy: Secondary | ICD-10-CM | POA: Diagnosis not present

## 2017-12-04 DIAGNOSIS — G894 Chronic pain syndrome: Secondary | ICD-10-CM | POA: Diagnosis not present

## 2017-12-04 DIAGNOSIS — I10 Essential (primary) hypertension: Secondary | ICD-10-CM | POA: Diagnosis not present

## 2017-12-04 DIAGNOSIS — D6949 Other primary thrombocytopenia: Secondary | ICD-10-CM | POA: Diagnosis not present

## 2017-12-04 DIAGNOSIS — E782 Mixed hyperlipidemia: Secondary | ICD-10-CM | POA: Diagnosis not present

## 2018-02-20 DIAGNOSIS — E1139 Type 2 diabetes mellitus with other diabetic ophthalmic complication: Secondary | ICD-10-CM | POA: Diagnosis not present

## 2018-03-08 DIAGNOSIS — D6949 Other primary thrombocytopenia: Secondary | ICD-10-CM | POA: Diagnosis not present

## 2018-03-08 DIAGNOSIS — M545 Low back pain: Secondary | ICD-10-CM | POA: Diagnosis not present

## 2018-03-08 DIAGNOSIS — E782 Mixed hyperlipidemia: Secondary | ICD-10-CM | POA: Diagnosis not present

## 2018-03-08 DIAGNOSIS — I10 Essential (primary) hypertension: Secondary | ICD-10-CM | POA: Diagnosis not present

## 2018-03-08 DIAGNOSIS — E1121 Type 2 diabetes mellitus with diabetic nephropathy: Secondary | ICD-10-CM | POA: Diagnosis not present

## 2018-03-08 DIAGNOSIS — E038 Other specified hypothyroidism: Secondary | ICD-10-CM | POA: Diagnosis not present

## 2018-03-08 DIAGNOSIS — E559 Vitamin D deficiency, unspecified: Secondary | ICD-10-CM | POA: Diagnosis not present

## 2018-03-08 DIAGNOSIS — G894 Chronic pain syndrome: Secondary | ICD-10-CM | POA: Diagnosis not present

## 2018-03-08 DIAGNOSIS — E1142 Type 2 diabetes mellitus with diabetic polyneuropathy: Secondary | ICD-10-CM | POA: Diagnosis not present

## 2018-03-08 DIAGNOSIS — D509 Iron deficiency anemia, unspecified: Secondary | ICD-10-CM | POA: Diagnosis not present

## 2018-03-08 DIAGNOSIS — F320 Major depressive disorder, single episode, mild: Secondary | ICD-10-CM | POA: Diagnosis not present

## 2018-03-13 DIAGNOSIS — R195 Other fecal abnormalities: Secondary | ICD-10-CM | POA: Diagnosis not present

## 2018-03-15 DIAGNOSIS — D500 Iron deficiency anemia secondary to blood loss (chronic): Secondary | ICD-10-CM | POA: Diagnosis not present

## 2018-03-15 DIAGNOSIS — R195 Other fecal abnormalities: Secondary | ICD-10-CM | POA: Diagnosis not present

## 2018-03-15 DIAGNOSIS — K2961 Other gastritis with bleeding: Secondary | ICD-10-CM | POA: Diagnosis not present

## 2018-04-09 ENCOUNTER — Other Ambulatory Visit: Payer: Self-pay | Admitting: Hematology and Oncology

## 2018-04-09 DIAGNOSIS — Z1501 Genetic susceptibility to malignant neoplasm of breast: Secondary | ICD-10-CM

## 2018-04-25 DIAGNOSIS — L02818 Cutaneous abscess of other sites: Secondary | ICD-10-CM | POA: Diagnosis not present

## 2018-04-25 DIAGNOSIS — E1165 Type 2 diabetes mellitus with hyperglycemia: Secondary | ICD-10-CM | POA: Diagnosis not present

## 2018-05-03 DIAGNOSIS — M75121 Complete rotator cuff tear or rupture of right shoulder, not specified as traumatic: Secondary | ICD-10-CM | POA: Diagnosis not present

## 2018-05-03 DIAGNOSIS — Z6838 Body mass index (BMI) 38.0-38.9, adult: Secondary | ICD-10-CM | POA: Diagnosis not present

## 2018-05-03 DIAGNOSIS — Z0000 Encounter for general adult medical examination without abnormal findings: Secondary | ICD-10-CM | POA: Diagnosis not present

## 2018-05-03 DIAGNOSIS — M25511 Pain in right shoulder: Secondary | ICD-10-CM | POA: Diagnosis not present

## 2018-05-03 DIAGNOSIS — J309 Allergic rhinitis, unspecified: Secondary | ICD-10-CM | POA: Diagnosis not present

## 2018-06-08 ENCOUNTER — Encounter (HOSPITAL_COMMUNITY): Payer: Self-pay | Admitting: Emergency Medicine

## 2018-06-08 ENCOUNTER — Other Ambulatory Visit: Payer: Self-pay

## 2018-06-08 ENCOUNTER — Emergency Department (HOSPITAL_COMMUNITY)
Admission: EM | Admit: 2018-06-08 | Discharge: 2018-06-08 | Disposition: A | Discharge status: Home / Self Care | Payer: PPO | Attending: Emergency Medicine | Admitting: Emergency Medicine

## 2018-06-08 ENCOUNTER — Emergency Department (HOSPITAL_COMMUNITY): Payer: PPO

## 2018-06-08 DIAGNOSIS — R0789 Other chest pain: Secondary | ICD-10-CM | POA: Diagnosis not present

## 2018-06-08 DIAGNOSIS — E038 Other specified hypothyroidism: Secondary | ICD-10-CM | POA: Diagnosis not present

## 2018-06-08 DIAGNOSIS — Z79899 Other long term (current) drug therapy: Secondary | ICD-10-CM | POA: Diagnosis not present

## 2018-06-08 DIAGNOSIS — E1169 Type 2 diabetes mellitus with other specified complication: Secondary | ICD-10-CM | POA: Diagnosis not present

## 2018-06-08 DIAGNOSIS — Z794 Long term (current) use of insulin: Secondary | ICD-10-CM | POA: Insufficient documentation

## 2018-06-08 DIAGNOSIS — R079 Chest pain, unspecified: Secondary | ICD-10-CM | POA: Diagnosis not present

## 2018-06-08 DIAGNOSIS — E039 Hypothyroidism, unspecified: Secondary | ICD-10-CM | POA: Diagnosis not present

## 2018-06-08 DIAGNOSIS — G894 Chronic pain syndrome: Secondary | ICD-10-CM | POA: Diagnosis not present

## 2018-06-08 DIAGNOSIS — G4733 Obstructive sleep apnea (adult) (pediatric): Secondary | ICD-10-CM | POA: Diagnosis not present

## 2018-06-08 DIAGNOSIS — E782 Mixed hyperlipidemia: Secondary | ICD-10-CM | POA: Diagnosis not present

## 2018-06-08 DIAGNOSIS — E119 Type 2 diabetes mellitus without complications: Secondary | ICD-10-CM | POA: Diagnosis not present

## 2018-06-08 DIAGNOSIS — D6949 Other primary thrombocytopenia: Secondary | ICD-10-CM | POA: Diagnosis not present

## 2018-06-08 LAB — CBC
HCT: 44.5 % (ref 36.0–46.0)
Hemoglobin: 14 g/dL (ref 12.0–15.0)
MCH: 27 pg (ref 26.0–34.0)
MCHC: 31.5 g/dL (ref 30.0–36.0)
MCV: 85.9 fL (ref 80.0–100.0)
Platelets: 154 10*3/uL (ref 150–400)
RBC: 5.18 MIL/uL — ABNORMAL HIGH (ref 3.87–5.11)
RDW: 15 % (ref 11.5–15.5)
WBC: 8.9 10*3/uL (ref 4.0–10.5)
nRBC: 0 % (ref 0.0–0.2)

## 2018-06-08 LAB — BASIC METABOLIC PANEL
Anion gap: 13 (ref 5–15)
BUN: 17 mg/dL (ref 6–20)
CO2: 20 mmol/L — ABNORMAL LOW (ref 22–32)
Calcium: 10 mg/dL (ref 8.9–10.3)
Chloride: 106 mmol/L (ref 98–111)
Creatinine, Ser: 1.26 mg/dL — ABNORMAL HIGH (ref 0.44–1.00)
GFR calc Af Amer: 58 mL/min — ABNORMAL LOW (ref >60)
GFR calc non Af Amer: 50 mL/min — ABNORMAL LOW (ref >60)
Glucose, Bld: 293 mg/dL — ABNORMAL HIGH (ref 70–99)
Potassium: 4.5 mmol/L (ref 3.5–5.1)
Sodium: 139 mmol/L (ref 135–145)

## 2018-06-08 LAB — D-DIMER, QUANTITATIVE: D-Dimer, Quant: <0.27 ug/mL-FEU (ref 0.00–0.50)

## 2018-06-08 LAB — TROPONIN I
Troponin I: <0.03 ng/mL (ref <0.03)
Troponin I: <0.03 ng/mL (ref <0.03)

## 2018-06-08 MED ORDER — SODIUM CHLORIDE 0.9% FLUSH: 3.0000 mL | Freq: Once | INTRAVENOUS | Status: DC

## 2018-06-08 NOTE — ED Triage Notes (Signed)
Pt to ED with c/o chest discomfort for several days.  Pt st's she went to her MD this am and had blood work and EKG.  Pt st's she was called and told to come to ED for abnormal lab and to be admitted

## 2018-06-10 NOTE — ED Provider Notes (Signed)
[facility]ERG[facility] DEPARTMENT Provider Note   CSN: [id number] Arrival date & time: 06/08/18  1814    History   Chief Complaint Chief Complaint  Patient presents with  . Chest Pain    HPI Linda [name] is a 48 y.o. female.     HPI Patient is a 48 year old female with a history of anemia, diabetes, hypertension who presents to the emergency department secondary to intermittent chest discomfort lasting seconds to minutes over the past several days.  She states that she was seen and evaluated by her physician this morning who reported abnormal blood work and need for evaluation in the emergency department.  She does not have the results of this lab work.  No report was given by her physicians team.  She denies a history of cardiac disease.  There is a family history of cardiac disease.  She has no active symptoms at this time.  No unilateral leg swelling.  No history of DVT or pulmonary embolism.  No family history of venous thromboembolic disease.  Denies fevers and chills.  No productive cough.  Denies abdominal pain.  Pain is located in her anterior chest with some radiation towards her bilateral shoulders.  This is transient and can last seconds.  Is been intermittent.  It is not associated with food.   Past Medical History:  Diagnosis Date  . Anemia   . Anxiety   . Arthritis   . Depression   . Diabetes mellitus without complication ([place])    type 2  . GERD (gastroesophageal reflux disease)   . History of kidney stones   . Hypertension   . Hypothyroidism   . Sleep apnea    hx of . No longer has    Patient Active Problem List   Diagnosis Date Noted  . BRCA1 positive 06/14/2016    Past Surgical History:  Procedure Laterality Date  . APPENDECTOMY    . BACK SURGERY     x 3 lower disc  . CHOLECYSTECTOMY    . ROBOTIC ASSISTED TOTAL HYSTERECTOMY WITH BILATERAL SALPINGO OOPHERECTOMY Bilateral 06/14/2016   Procedure: ROBOTIC ASSISTED TOTAL HYSTERECTOMY  WITH BILATERAL SALPINGO OOPHORECTOMY;  Surgeon: Nancy Marus, MD;  Location: [facility];  Service: Gynecology;  Laterality: Bilateral;     OB History   No obstetric history on file.      Home Medications    Prior to Admission medications   Medication Sig Start Date End Date Taking? Authorizing Provider  azelastine (OPTIVAR) 0.05 % ophthalmic solution Place 1 drop into both eyes 2 (two) times daily as needed (dry eyes).   Yes [provider]  buPROPion (WELLBUTRIN XL) 300 MG 24 hr tablet Take 300 mg by mouth at bedtime.    Yes [provider]  cetirizine (ZYRTEC) 10 MG tablet Take 10 mg by mouth 2 (two) times daily as needed (seasonal allergies).   Yes [provider]  clotrimazole-betamethasone (LOTRISONE) cream Apply 1 application topically daily as needed (rash/yeast).    Yes [provider]  Dulaglutide (TRULICITY) 0.25 EN/2.7PO SOPN Inject 0.75 mg into the skin every Saturday.    Yes [provider]  Empagliflozin-metFORMIN HCl (SYNJARDY) 05-998 MG TABS Take 1 tablet by mouth 2 (two) times a day. Synjardy   Yes [provider]  famotidine (PEPCID) 20 MG tablet Take 20 mg by mouth 2 (two) times daily.    Yes [provider]  ferrous sulfate 325 (65 FE) MG tablet Take 325 mg by mouth daily with  breakfast.   Yes [provider]  gabapentin (NEURONTIN) 300 MG capsule Take 600 mg by mouth 2 (two) times daily.    Yes [provider]  insulin glargine (LANTUS) 100 unit/mL SOPN Inject 68 Units into the skin at bedtime.   Yes [provider]  Levomilnacipran HCl ER (FETZIMA) 80 MG CP24 Take 80 mg by mouth at bedtime.   Yes [provider]  levothyroxine (SYNTHROID, LEVOTHROID) 75 MCG tablet Take 75 mcg by mouth daily before breakfast.    Yes [provider]  losartan (COZAAR) 50 MG tablet Take 50 mg by mouth at bedtime.    Yes [provider]  methocarbamol (ROBAXIN) 500 MG tablet  Take 500 mg by mouth 2 (two) times daily.    Yes [provider]  morphine (MS CONTIN) 30 MG 12 hr tablet Take 30 mg by mouth every 12 (twelve) hours.   Yes [provider]  Multiple Vitamin (MULTIVITAMIN WITH MINERALS) TABS tablet Take 1 tablet by mouth daily.   Yes [provider]  naproxen (NAPROSYN) 500 MG tablet Take 500 mg by mouth daily as needed (back pain).   Yes [provider]  pantoprazole (PROTONIX) 40 MG tablet Take 40 mg by mouth 2 (two) times daily.    Yes [provider]  potassium chloride (MICRO-K) 10 MEQ CR capsule Take 20 mEq by mouth 2 (two) times daily.    Yes [provider]  pravastatin (PRAVACHOL) 20 MG tablet Take 20 mg by mouth at bedtime.  11/30/15  Yes [provider]  prochlorperazine (COMPAZINE) 10 MG tablet Take 10 mg by mouth 4 (four) times daily as needed for nausea or vomiting.    Yes [provider]  raloxifene (EVISTA) 60 MG tablet Take 60 mg by mouth daily.   Yes [provider]  sucralfate (CARAFATE) 1 g tablet Take 1 g by mouth 4 (four) times daily -  with meals and at bedtime.  06/08/16  Yes [provider]  Vitamin D, Ergocalciferol, (DRISDOL) 50000 units CAPS capsule Take 50,000 Units by mouth every Friday. Friday nights   Yes [provider]  zolpidem (AMBIEN) 10 MG tablet Take 10 mg by mouth at bedtime.    Yes [provider]    Family History No family history on file.  Social History Social History   Tobacco Use  . Smoking status: Never Smoker  . Smokeless tobacco: Never Used  Substance Use Topics  . Alcohol use: No  . Drug use: No     Allergies   Codeine; Celecoxib; Ezetimibe-simvastatin; and Propranolol hcl   Review of Systems Review of Systems  All other systems reviewed and are negative.    Physical Exam Updated Vital Signs BP 134/62   Pulse 100   Temp 98.9 F (37.2 C) (Oral)   Resp 19   Ht 5' 2"  (1.575 m)   Wt  99.3 kg   LMP  (Exact Date)   SpO2 100%   BMI 40.06 kg/m   Physical Exam Vitals signs and nursing note reviewed.  Constitutional:      General: She is not in acute distress.    Appearance: She is well-developed.  HENT:     Head: Normocephalic and atraumatic.  Neck:     Musculoskeletal: Normal range of motion.  Cardiovascular:     Rate and Rhythm: Normal rate and regular rhythm.     Heart sounds: Normal heart sounds.  Pulmonary:     Effort: Pulmonary effort is  normal.     Breath sounds: Normal breath sounds.  Abdominal:     General: There is no distension.     Palpations: Abdomen is soft.     Tenderness: There is no abdominal tenderness.  Musculoskeletal: Normal range of motion.  Skin:    General: Skin is warm and dry.  Neurological:     Mental Status: She is alert and oriented to person, place, and time.  Psychiatric:        Judgment: Judgment normal.      ED Treatments / Results  Labs (all labs ordered are listed, but only abnormal results are displayed) Labs Reviewed  BASIC METABOLIC PANEL - Abnormal; Notable for the following components:      Result Value   CO2 20 (*)    Glucose, Bld 293 (*)    Creatinine, Ser 1.26 (*)    GFR calc non Af Amer 50 (*)    GFR calc Af Amer 58 (*)    All other components within normal limits  CBC - Abnormal; Notable for the following components:   RBC 5.18 (*)    All other components within normal limits  TROPONIN I  D-DIMER, QUANTITATIVE (NOT AT [facility])  TROPONIN I    EKG EKG Interpretation  Date/Time:  Friday Jun 08 2018 18:35:46 EDT Ventricular Rate:  122 PR Interval:  140 QRS Duration: 80 QT Interval:  292 QTC Calculation: 416 R Axis:   79 Text Interpretation:  Sinus tachycardia Low voltage QRS Septal infarct , age undetermined Abnormal ECG No significant change was found Confirmed by Jola Schmidt [phone]) on 06/08/2018 6:55:08 PM Also confirmed by Jola Schmidt [phone]), editor Philomena Doheny [phone])  on 06/09/2018  7:12:39 AM   Radiology No results found.  Procedures Procedures (including critical care time)  Medications Ordered in ED Medications - No data to display   Initial Impression / Assessment and Plan / ED Course  I have reviewed the triage vital signs and the nursing notes.  Pertinent labs & imaging results that were available during my care of the patient were reviewed by me and considered in my medical decision making (see chart for details).        Work-up in the emergency department without significant abnormality.  D-dimer is negative.  Troponin x2 is negative.  Patient will need outpatient cardiology follow-up.  I do not think she needs additional work-up or acute hospitalization at this time.  Currently asymptomatic.  Is unclear to me with the abnormal lab work noted by her clinician was.  Patient is reassured by this and is agreeable to outpatient follow-up.  Patient understands return to emergency department for new or worsening symptoms  Final Clinical Impressions(s) / ED Diagnoses   Final diagnoses:  Chest pain, unspecified type    ED Discharge Orders    None       Jola Schmidt, MD 06/10/18 2056

## 2018-08-22 DIAGNOSIS — N649 Disorder of breast, unspecified: Secondary | ICD-10-CM | POA: Diagnosis not present

## 2018-08-22 DIAGNOSIS — R928 Other abnormal and inconclusive findings on diagnostic imaging of breast: Secondary | ICD-10-CM | POA: Diagnosis not present

## 2018-08-27 DIAGNOSIS — D696 Thrombocytopenia, unspecified: Secondary | ICD-10-CM | POA: Diagnosis not present

## 2018-08-27 DIAGNOSIS — D693 Immune thrombocytopenic purpura: Secondary | ICD-10-CM | POA: Diagnosis not present

## 2018-08-27 DIAGNOSIS — D649 Anemia, unspecified: Secondary | ICD-10-CM | POA: Diagnosis not present

## 2018-08-27 DIAGNOSIS — Z1501 Genetic susceptibility to malignant neoplasm of breast: Secondary | ICD-10-CM | POA: Diagnosis not present

## 2018-08-27 DIAGNOSIS — R300 Dysuria: Secondary | ICD-10-CM | POA: Diagnosis not present

## 2018-08-30 DIAGNOSIS — N200 Calculus of kidney: Secondary | ICD-10-CM | POA: Diagnosis not present

## 2018-08-30 DIAGNOSIS — D649 Anemia, unspecified: Secondary | ICD-10-CM | POA: Diagnosis not present

## 2018-08-30 DIAGNOSIS — R161 Splenomegaly, not elsewhere classified: Secondary | ICD-10-CM | POA: Diagnosis not present

## 2018-08-30 DIAGNOSIS — K7460 Unspecified cirrhosis of liver: Secondary | ICD-10-CM | POA: Diagnosis not present

## 2018-08-30 DIAGNOSIS — R1084 Generalized abdominal pain: Secondary | ICD-10-CM | POA: Diagnosis not present

## 2018-09-05 DIAGNOSIS — L97512 Non-pressure chronic ulcer of other part of right foot with fat layer exposed: Secondary | ICD-10-CM | POA: Diagnosis not present

## 2018-09-05 DIAGNOSIS — E1142 Type 2 diabetes mellitus with diabetic polyneuropathy: Secondary | ICD-10-CM | POA: Diagnosis not present

## 2018-09-05 DIAGNOSIS — E11621 Type 2 diabetes mellitus with foot ulcer: Secondary | ICD-10-CM | POA: Diagnosis not present

## 2018-09-05 DIAGNOSIS — S90222A Contusion of left lesser toe(s) with damage to nail, initial encounter: Secondary | ICD-10-CM | POA: Diagnosis not present

## 2018-09-05 DIAGNOSIS — L608 Other nail disorders: Secondary | ICD-10-CM | POA: Diagnosis not present

## 2018-09-13 DIAGNOSIS — E782 Mixed hyperlipidemia: Secondary | ICD-10-CM | POA: Diagnosis not present

## 2018-09-13 DIAGNOSIS — I10 Essential (primary) hypertension: Secondary | ICD-10-CM | POA: Diagnosis not present

## 2018-09-13 DIAGNOSIS — K7581 Nonalcoholic steatohepatitis (NASH): Secondary | ICD-10-CM | POA: Insufficient documentation

## 2018-09-13 DIAGNOSIS — E1121 Type 2 diabetes mellitus with diabetic nephropathy: Secondary | ICD-10-CM | POA: Diagnosis not present

## 2018-09-13 DIAGNOSIS — E1142 Type 2 diabetes mellitus with diabetic polyneuropathy: Secondary | ICD-10-CM | POA: Diagnosis not present

## 2018-09-13 DIAGNOSIS — D6949 Other primary thrombocytopenia: Secondary | ICD-10-CM | POA: Diagnosis not present

## 2018-09-13 DIAGNOSIS — K7469 Other cirrhosis of liver: Secondary | ICD-10-CM | POA: Insufficient documentation

## 2018-09-13 DIAGNOSIS — K7460 Unspecified cirrhosis of liver: Secondary | ICD-10-CM | POA: Insufficient documentation

## 2018-09-13 DIAGNOSIS — F320 Major depressive disorder, single episode, mild: Secondary | ICD-10-CM | POA: Diagnosis not present

## 2018-09-13 DIAGNOSIS — E038 Other specified hypothyroidism: Secondary | ICD-10-CM | POA: Diagnosis not present

## 2018-09-14 DIAGNOSIS — E782 Mixed hyperlipidemia: Secondary | ICD-10-CM | POA: Diagnosis not present

## 2018-09-14 DIAGNOSIS — Z23 Encounter for immunization: Secondary | ICD-10-CM | POA: Diagnosis not present

## 2018-09-14 DIAGNOSIS — E1142 Type 2 diabetes mellitus with diabetic polyneuropathy: Secondary | ICD-10-CM | POA: Diagnosis not present

## 2018-09-14 DIAGNOSIS — I10 Essential (primary) hypertension: Secondary | ICD-10-CM | POA: Diagnosis not present

## 2018-09-28 DIAGNOSIS — R944 Abnormal results of kidney function studies: Secondary | ICD-10-CM | POA: Diagnosis not present

## 2018-10-03 DIAGNOSIS — E1142 Type 2 diabetes mellitus with diabetic polyneuropathy: Secondary | ICD-10-CM | POA: Diagnosis not present

## 2018-10-03 DIAGNOSIS — L608 Other nail disorders: Secondary | ICD-10-CM | POA: Diagnosis not present

## 2018-10-03 DIAGNOSIS — E11621 Type 2 diabetes mellitus with foot ulcer: Secondary | ICD-10-CM | POA: Diagnosis not present

## 2018-10-03 DIAGNOSIS — L97512 Non-pressure chronic ulcer of other part of right foot with fat layer exposed: Secondary | ICD-10-CM | POA: Diagnosis not present

## 2018-10-09 DIAGNOSIS — K7460 Unspecified cirrhosis of liver: Secondary | ICD-10-CM | POA: Diagnosis not present

## 2018-10-09 DIAGNOSIS — K7469 Other cirrhosis of liver: Secondary | ICD-10-CM | POA: Diagnosis not present

## 2018-12-17 DIAGNOSIS — H5203 Hypermetropia, bilateral: Secondary | ICD-10-CM | POA: Diagnosis not present

## 2018-12-17 DIAGNOSIS — D500 Iron deficiency anemia secondary to blood loss (chronic): Secondary | ICD-10-CM | POA: Diagnosis not present

## 2018-12-17 DIAGNOSIS — E119 Type 2 diabetes mellitus without complications: Secondary | ICD-10-CM | POA: Diagnosis not present

## 2018-12-17 DIAGNOSIS — H2513 Age-related nuclear cataract, bilateral: Secondary | ICD-10-CM | POA: Diagnosis not present

## 2018-12-18 DIAGNOSIS — R195 Other fecal abnormalities: Secondary | ICD-10-CM | POA: Diagnosis not present

## 2019-01-01 DIAGNOSIS — D6949 Other primary thrombocytopenia: Secondary | ICD-10-CM | POA: Diagnosis not present

## 2019-01-01 DIAGNOSIS — E1121 Type 2 diabetes mellitus with diabetic nephropathy: Secondary | ICD-10-CM | POA: Diagnosis not present

## 2019-01-01 DIAGNOSIS — F320 Major depressive disorder, single episode, mild: Secondary | ICD-10-CM | POA: Diagnosis not present

## 2019-01-01 DIAGNOSIS — E038 Other specified hypothyroidism: Secondary | ICD-10-CM | POA: Diagnosis not present

## 2019-01-01 DIAGNOSIS — E782 Mixed hyperlipidemia: Secondary | ICD-10-CM | POA: Diagnosis not present

## 2019-01-01 DIAGNOSIS — E1142 Type 2 diabetes mellitus with diabetic polyneuropathy: Secondary | ICD-10-CM | POA: Diagnosis not present

## 2019-01-01 DIAGNOSIS — I10 Essential (primary) hypertension: Secondary | ICD-10-CM | POA: Diagnosis not present

## 2019-01-03 DIAGNOSIS — R195 Other fecal abnormalities: Secondary | ICD-10-CM | POA: Diagnosis not present

## 2019-01-03 DIAGNOSIS — K2961 Other gastritis with bleeding: Secondary | ICD-10-CM | POA: Diagnosis not present

## 2019-01-03 DIAGNOSIS — D500 Iron deficiency anemia secondary to blood loss (chronic): Secondary | ICD-10-CM | POA: Diagnosis not present

## 2019-01-03 DIAGNOSIS — K7460 Unspecified cirrhosis of liver: Secondary | ICD-10-CM | POA: Diagnosis not present

## 2019-01-29 DIAGNOSIS — Z79899 Other long term (current) drug therapy: Secondary | ICD-10-CM | POA: Diagnosis not present

## 2019-01-29 DIAGNOSIS — Z794 Long term (current) use of insulin: Secondary | ICD-10-CM | POA: Diagnosis not present

## 2019-01-29 DIAGNOSIS — R109 Unspecified abdominal pain: Secondary | ICD-10-CM | POA: Diagnosis not present

## 2019-01-29 DIAGNOSIS — E039 Hypothyroidism, unspecified: Secondary | ICD-10-CM | POA: Diagnosis not present

## 2019-01-29 DIAGNOSIS — Z79891 Long term (current) use of opiate analgesic: Secondary | ICD-10-CM | POA: Diagnosis not present

## 2019-01-29 DIAGNOSIS — G8929 Other chronic pain: Secondary | ICD-10-CM | POA: Diagnosis not present

## 2019-01-29 DIAGNOSIS — R1012 Left upper quadrant pain: Secondary | ICD-10-CM | POA: Diagnosis not present

## 2019-01-29 DIAGNOSIS — R1011 Right upper quadrant pain: Secondary | ICD-10-CM | POA: Diagnosis not present

## 2019-01-29 DIAGNOSIS — Z7982 Long term (current) use of aspirin: Secondary | ICD-10-CM | POA: Diagnosis not present

## 2019-01-29 DIAGNOSIS — R1013 Epigastric pain: Secondary | ICD-10-CM | POA: Diagnosis not present

## 2019-01-29 DIAGNOSIS — E119 Type 2 diabetes mellitus without complications: Secondary | ICD-10-CM | POA: Diagnosis not present

## 2019-01-29 DIAGNOSIS — Z7984 Long term (current) use of oral hypoglycemic drugs: Secondary | ICD-10-CM | POA: Diagnosis not present

## 2019-01-29 DIAGNOSIS — M549 Dorsalgia, unspecified: Secondary | ICD-10-CM | POA: Diagnosis not present

## 2019-02-05 DIAGNOSIS — R1084 Generalized abdominal pain: Secondary | ICD-10-CM | POA: Diagnosis not present

## 2019-02-25 ENCOUNTER — Other Ambulatory Visit: Payer: Self-pay | Admitting: Family Medicine

## 2019-02-25 MED ORDER — MORPHINE SULFATE ER 30 MG PO TBCR: 30.0000 mg | EXTENDED_RELEASE_TABLET | Freq: Two times a day (BID) | ORAL | 0 refills | Status: DC

## 2019-02-25 NOTE — Telephone Encounter (Signed)
PATIENT NEEDS REFILL ON MORPHINE ER. [facility]

## 2019-03-04 DIAGNOSIS — L608 Other nail disorders: Secondary | ICD-10-CM | POA: Diagnosis not present

## 2019-03-04 DIAGNOSIS — L84 Corns and callosities: Secondary | ICD-10-CM | POA: Insufficient documentation

## 2019-03-04 DIAGNOSIS — E1142 Type 2 diabetes mellitus with diabetic polyneuropathy: Secondary | ICD-10-CM | POA: Diagnosis not present

## 2019-03-13 ENCOUNTER — Other Ambulatory Visit: Payer: Self-pay | Admitting: Family Medicine

## 2019-03-29 DIAGNOSIS — K7581 Nonalcoholic steatohepatitis (NASH): Secondary | ICD-10-CM | POA: Diagnosis not present

## 2019-04-01 ENCOUNTER — Other Ambulatory Visit: Payer: Self-pay | Admitting: Family Medicine

## 2019-04-01 DIAGNOSIS — Z1501 Genetic susceptibility to malignant neoplasm of breast: Secondary | ICD-10-CM

## 2019-04-01 MED ORDER — MORPHINE SULFATE ER 30 MG PO TBCR: 30.0000 mg | EXTENDED_RELEASE_TABLET | Freq: Two times a day (BID) | ORAL | 0 refills | Status: DC

## 2019-04-03 ENCOUNTER — Encounter: Payer: Self-pay | Admitting: Family Medicine

## 2019-04-03 ENCOUNTER — Ambulatory Visit (INDEPENDENT_AMBULATORY_CARE_PROVIDER_SITE_OTHER): Payer: PPO | Admitting: Family Medicine

## 2019-04-03 ENCOUNTER — Other Ambulatory Visit: Payer: Self-pay

## 2019-04-03 VITALS — BP 116/64 | HR 100 | Temp 97.8°F | Resp 16 | Ht 63.0 in | Wt 203.0 lb

## 2019-04-03 DIAGNOSIS — E039 Hypothyroidism, unspecified: Secondary | ICD-10-CM

## 2019-04-03 DIAGNOSIS — E559 Vitamin D deficiency, unspecified: Secondary | ICD-10-CM | POA: Diagnosis not present

## 2019-04-03 DIAGNOSIS — F330 Major depressive disorder, recurrent, mild: Secondary | ICD-10-CM

## 2019-04-03 DIAGNOSIS — E1169 Type 2 diabetes mellitus with other specified complication: Secondary | ICD-10-CM

## 2019-04-03 DIAGNOSIS — Z6835 Body mass index (BMI) 35.0-35.9, adult: Secondary | ICD-10-CM

## 2019-04-03 DIAGNOSIS — I10 Essential (primary) hypertension: Secondary | ICD-10-CM

## 2019-04-03 DIAGNOSIS — E785 Hyperlipidemia, unspecified: Secondary | ICD-10-CM | POA: Diagnosis not present

## 2019-04-03 DIAGNOSIS — E782 Mixed hyperlipidemia: Secondary | ICD-10-CM

## 2019-04-03 NOTE — Patient Instructions (Signed)
No changes to medicines Labs drawn.  Irritable Bowel Syndrome, Adult  Irritable bowel syndrome (IBS) is a group of symptoms that affects the organs responsible for digestion (gastrointestinal or GI tract). IBS is not one specific disease. To regulate how the GI tract works, the body sends signals back and forth between the intestines and the brain. If you have IBS, there may be a problem with these signals. As a result, the GI tract does not function normally. The intestines may become more sensitive and overreact to certain things. This may be especially true when you eat certain foods or when you are under stress. There are four types of IBS. These may be determined based on the consistency of your stool (feces):  IBS with diarrhea.  IBS with constipation.  Mixed IBS.  Unsubtyped IBS. It is important to know which type of IBS you have. Certain treatments are more likely to be helpful for certain types of IBS. What are the causes? The exact cause of IBS is not known. What increases the risk? You may have a higher risk for IBS if you:  Are female.  Are younger than 55.  Have a family history of IBS.  Have a mental health condition, such as depression, anxiety, or post-traumatic stress disorder.  Have had a bacterial infection of your GI tract. What are the signs or symptoms? Symptoms of IBS vary from person to person. The main symptom is abdominal pain or discomfort. Other symptoms usually include one or more of the following:  Diarrhea, constipation, or both.  Abdominal swelling or bloating.  Feeling full after eating a small or regular-sized meal.  Frequent gas.  Mucus in the stool.  A feeling of having more stool left after a bowel movement. Symptoms tend to come and go. They may be triggered by stress, mental health conditions, or certain foods. How is this diagnosed? This condition may be diagnosed based on a physical exam, your medical history, and your symptoms.  You may have tests, such as:  Blood tests.  Stool test.  X-rays.  CT scan.  Colonoscopy. This is a procedure in which your GI tract is viewed with a long, thin, flexible tube. How is this treated? There is no cure for IBS, but treatment can help relieve symptoms. Treatment depends on the type of IBS you have, and may include:  Changes to your diet, such as: ? Avoiding foods that cause symptoms. ? Drinking more water. ? Following a low-FODMAP (fermentable oligosaccharides, disaccharides, monosaccharides, and polyols) diet for up to 6 weeks, or as told by your health care provider. FODMAPs are sugars that are hard for some people to digest. ? Eating more fiber. ? Eating medium-sized meals at the same times every day.  Medicines. These may include: ? Fiber supplements, if you have constipation. ? Medicine to control diarrhea (antidiarrheal medicines). ? Medicine to help control muscle tightening (spasms) in your GI tract (antispasmodic medicines). ? Medicines to help with mental health conditions, such as antidepressants or tranquilizers.  Talk therapy or counseling.  Working with a diet and nutrition specialist (dietitian) to help create a food plan that is right for you.  Managing your stress. Follow these instructions at home: Eating and drinking  Eat a healthy diet.  Eat medium-sized meals at about the same time every day. Do not eat large meals.  Gradually eat more fiber-rich foods. These include whole grains, fruits, and vegetables. This may be especially helpful if you have IBS with constipation.  Eat a  diet low in FODMAPs.  Drink enough fluid to keep your urine pale yellow.  Keep a journal of foods that seem to trigger symptoms.  Avoid foods and drinks that: ? Contain added sugar. ? Make your symptoms worse. Dairy products, caffeinated drinks, and carbonated drinks can make symptoms worse for some people. General instructions  Take over-the-counter and  prescription medicines and supplements only as told by your health care provider.  Get enough exercise. Do at least 150 minutes of moderate-intensity exercise each week.  Manage your stress. Getting enough sleep and exercise can help you manage stress.  Keep all follow-up visits as told by your health care provider and therapist. This is important. Alcohol Use  Do not drink alcohol if: ? Your health care provider tells you not to drink. ? You are pregnant, may be pregnant, or are planning to become pregnant.  If you drink alcohol, limit how much you have: ? 0-1 drink a day for women. ? 0-2 drinks a day for men.  Be aware of how much alcohol is in your drink. In the [place] one drink equals one typical bottle of beer (12 oz), one-half glass of wine (5 oz), or one shot of hard liquor (1 oz). Contact a health care provider if you have:  Constant pain.  Weight loss.  Difficulty or pain when swallowing.  Diarrhea that gets worse. Get help right away if you have:  Severe abdominal pain.  Fever.  Diarrhea with symptoms of dehydration, such as dizziness or dry mouth.  Bright red blood in your stool.  Stool that is black and tarry.  Abdominal swelling.  Vomiting that does not stop.  Blood in your vomit. Summary  Irritable bowel syndrome (IBS) is not one specific disease. It is a group of symptoms that affects digestion.  Your intestines may become more sensitive and overreact to certain things. This may be especially true when you eat certain foods or when you are under stress.  There is no cure for IBS, but treatment can help relieve symptoms. This information is not intended to replace advice given to you by your health care provider. Make sure you discuss any questions you have with your health care provider. Document Revised: 01/03/2017 Document Reviewed: 01/03/2017 Elsevier Patient Education  2020 [place].  Diet for Irritable Bowel Syndrome When you have  irritable bowel syndrome (IBS), it is very important to eat the foods and follow the eating habits that are best for your condition. IBS may cause various symptoms such as pain in the abdomen, constipation, or diarrhea. Choosing the right foods can help to ease the discomfort from these symptoms. Work with your health care provider and diet and nutrition specialist (dietitian) to find the eating plan that will help to control your symptoms. What are tips for following this plan?      Keep a food diary. This will help you identify foods that cause symptoms. Write down: ? What you eat and when you eat it. ? What symptoms you have. ? When symptoms occur in relation to your meals, such as "pain in abdomen 2 hours after dinner."  Eat your meals slowly and in a relaxed setting.  Aim to eat 5-6 small meals per day. Do not skip meals.  Drink enough fluid to keep your urine pale yellow.  Ask your health care provider if you should take an over-the-counter probiotic to help restore healthy bacteria in your gut (digestive tract). ? Probiotics are foods that contain good bacteria and  yeasts.  Your dietitian may have specific dietary recommendations for you based on your symptoms. He or she may recommend that you: ? Avoid foods that cause symptoms. Talk with your dietitian about other ways to get the same nutrients that are in those problem foods. ? Avoid foods with gluten. Gluten is a protein that is found in rye, wheat, and barley. ? Eat more foods that contain soluble fiber. Examples of foods with high soluble fiber include oats, seeds, and certain fruits and vegetables. Take a fiber supplement if directed by your dietitian. ? Reduce or avoid certain foods called FODMAPs. These are foods that contain carbohydrates that are hard to digest. Ask your doctor which foods contain these carbohydrates. What foods are not recommended? The following are some foods and drinks that may make your symptoms  worse:  Fatty foods, such as french fries.  Foods that contain gluten, such as pasta and cereal.  Dairy products, such as milk, cheese, and ice cream.  Chocolate.  Alcohol.  Products with caffeine, such as coffee.  Carbonated drinks, such as soda.  Foods that are high in FODMAPs. These include certain fruits and vegetables.  Products with sweeteners such as honey, high fructose corn syrup, sorbitol, and mannitol. The items listed above may not be a complete list of foods and beverages you should avoid. Contact a dietitian for more information. What foods are good sources of fiber? Your health care provider or dietitian may recommend that you eat more foods that contain fiber. Fiber can help to reduce constipation and other IBS symptoms. Add foods with fiber to your diet a little at a time so your body can get used to them. Too much fiber at one time might cause gas and swelling of your abdomen. The following are some foods that are good sources of fiber:  Berries, such as raspberries, strawberries, and blueberries.  Tomatoes.  Carrots.  Brown rice.  Oats.  Seeds, such as chia and pumpkin seeds. The items listed above may not be a complete list of recommended sources of fiber. Contact your dietitian for more options. Where to find more information  International Foundation for Functional Gastrointestinal Disorders: www.iffgd.[employer] of Diabetes and Digestive and Kidney Diseases: [employer] Summary  When you have irritable bowel syndrome (IBS), it is very important to eat the foods and follow the eating habits that are best for your condition.  IBS may cause various symptoms such as pain in the abdomen, constipation, or diarrhea.  Choosing the right foods can help to ease the discomfort that comes from symptoms.  Keep a food diary. This will help you identify foods that cause symptoms.  Your health care provider or diet and nutrition specialist  (dietitian) may recommend that you eat more foods that contain fiber. This information is not intended to replace advice given to you by your health care provider. Make sure you discuss any questions you have with your health care provider. Document Revised: 05/02/2018 Document Reviewed: 09/13/2016 Elsevier Patient Education  [place].

## 2019-04-03 NOTE — Progress Notes (Signed)
Subjective:  Patient ID: Linda [name], female    DOB: [birth year]  Age: 49 y.o. MRN: [id number]  Chief Complaint  Patient presents with  . Diabetes  . Hypothyroidism  . Hyperlipidemia    HPI Patient presents for follow up of diabetes, hypothyroidism, and hyperlipidemia. Still moving. She hurt her toes BL and is seeing a foot doctor. Sugars 180-190. Has been eating out a lot. Currently taking synjardy, lantus, and trulicity. Pravastatin for cholesterol.   For hypothyroidism currently taking synthroid 75 mcg daily   Hypertension  Is well controlled on losartan 50 mg once daily.  Patient had another episode of nausea, bloating, abdominal pain,  and profuse diarrhea which lasted for 1 week. She took dicyclomine and zofran. Helped some, but was still miserable. Today is feeling better. Patient was having loose stools 5-6 times a day, but has improved. Has had Dr. Melina Copa. Taking pantoprazole twice a day and carafate 1 gm before meals, in addition to taking bentyl 20 mg one qac and qhs.   Chronic pain syndrome due to low back pain which is managed with gabapentin, robaxin, and morphine ER 30 mg one twice a day.   Depression fairly well controlled on wellbutrin xl and fetzima. Ambien for insomnia. Increased stress recently due to move. Prefer not to speak with a counselor, just wants to finish moving.    Social History   Socioeconomic History  . Marital status: Legally Separated    Spouse name: Not on file  . Number of children: Not on file  . Years of education: Not on file  . Highest education level: Not on file  Occupational History  . Occupation: disabled  Tobacco Use  . Smoking status: Never Smoker  . Smokeless tobacco: Never Used  Substance and Sexual Activity  . Alcohol use: No  . Drug use: No  . Sexual activity: Yes  Other Topics Concern  . Not on file  Social History Narrative   wears sunscreen, brushes and flosses daily, see's dentist bi-annually, has smoke/carbon  monoxide detectors, wears a seatbelt and practices gun safety   Social Determinants of Health   Financial Resource Strain:   . Difficulty of Paying Living Expenses:   Food Insecurity:   . Worried About Charity fundraiser in the Last Year:   . Arboriculturist in the Last Year:   Transportation Needs:   . Film/video editor (Medical):   Marland Kitchen Lack of Transportation (Non-Medical):   Physical Activity:   . Days of Exercise per Week:   . Minutes of Exercise per Session:   Stress:   . Feeling of Stress :   Social Connections:   . Frequency of Communication with Friends and Family:   . Frequency of Social Gatherings with Friends and Family:   . Attends Religious Services:   . Active Member of Clubs or Organizations:   . Attends Archivist Meetings:   Marland Kitchen Marital Status:    Past Medical History:  Diagnosis Date  . Anemia   . Anxiety   . Arthritis   . BRCA gene mutation positive   . Chronic pain syndrome   . Depression   . Diabetes mellitus without complication ([place])    type 2  . Gastritis   . GERD (gastroesophageal reflux disease)   . History of kidney stones   . Hypertension   . Hypothyroidism   . Mixed hyperlipidemia   . Other primary thrombocytopenia ([place])   . Sleep apnea  hx of . No longer has   Family History  Problem Relation Age of Onset  . Cancer Mother        breast  . CAD Father   . Diabetes Father   . Heart failure Father   . Cancer Father        renal carcinoma  . Kidney failure Father   . Stroke Paternal Grandmother     Review of Systems  Constitutional: Positive for fever. Negative for chills and fatigue.       100.5 last week.   HENT: Negative for congestion, ear pain and sore throat.   Respiratory: Negative for cough and shortness of breath.   Cardiovascular: Negative for chest pain.  Gastrointestinal:       See HPI.  Endocrine: Negative for polydipsia, polyphagia and polyuria.  Genitourinary: Negative for dysuria.   Musculoskeletal: Positive for back pain.       See HPI.   Neurological: Negative for headaches.  Psychiatric/Behavioral:       See HPI.     Objective:  BP 116/64   Pulse 100   Temp 97.8 F (36.6 C)   Resp 16   Ht 5' 3"  (1.6 m)   Wt 203 lb (92.1 kg)   LMP 06/13/2009   BMI 35.96 kg/m   BP/Weight 04/03/2019 06/08/2018 9/75/3005  Systolic BP 110 211 173  Diastolic BP 64 62 79  Wt. (Lbs) 203 219 223.3  BMI 35.96 40.06 40.84    Physical Exam Vitals reviewed.  Constitutional:      General: She is not in acute distress.    Appearance: Normal appearance. She is obese.  Neck:     Thyroid: No thyroid mass.  Cardiovascular:     Rate and Rhythm: Normal rate and regular rhythm.     Pulses: Normal pulses.     Heart sounds: No murmur.  Pulmonary:     Effort: Pulmonary effort is normal.     Breath sounds: Normal breath sounds.  Abdominal:     General: Bowel sounds are normal.     Palpations: Abdomen is soft. There is no mass.     Tenderness: There is abdominal tenderness.     Comments: Mild diffuse  Musculoskeletal:        General: Normal range of motion.  Lymphadenopathy:     Cervical: No cervical adenopathy.  Skin:    General: Skin is warm and dry.  Neurological:     Mental Status: She is alert and oriented to person, place, and time.     Cranial Nerves: No cranial nerve deficit.  Psychiatric:        Mood and Affect: Mood normal.        Behavior: Behavior normal.    Diabetic Foot Exam - Simple   Simple Foot Form Visual Inspection See comments: Yes Sensation Testing See comments: Yes Pulse Check Posterior Tibialis and Dorsalis pulse intact bilaterally: Yes Comments Toes curl down. Shiny skin. Decreased sensation BL feet on plantar surfaces.     Lab Results  Component Value Date   WBC 8.7 04/03/2019   HGB 13.4 04/03/2019   HCT 41.5 04/03/2019   PLT 154 04/03/2019   GLUCOSE 110 (H) 04/03/2019   CHOL 158 04/03/2019   TRIG 95 04/03/2019   HDL 59 04/03/2019    LDLCALC 82 04/03/2019   ALT 12 04/03/2019   AST 17 04/03/2019   NA 138 04/03/2019   K 3.5 04/03/2019   CL 98 04/03/2019   CREATININE 1.05 (H) 04/03/2019  BUN 10 04/03/2019   CO2 24 04/03/2019   TSH 2.070 04/03/2019   HGBA1C 5.6 04/03/2019      Assessment & Plan:    1. Mixed hyperlipidemia Well controlled.  No changes to medicines.  Continue to work on eating a healthy diet and exercise.  Labs drawn today. - Lipid panel  2. Dyslipidemia associated with type 2 diabetes mellitus ([place]) Well controlled.  No changes to medicines. Consider decreasing medicines in future as a1c has been excellent. Continue to work on eating a healthy diet and exercise.  Labs drawn today.  - Hemoglobin A1c - POCT UA - Microalbumin  3. Essential hypertension, benign Well controlled. The current medical regimen is effective;  continue present plan and medications. - CBC with Differential/Platelet - Comprehensive metabolic panel  4. Major depressive disorder, recurrent, mild (HCC) The current medical regimen is effective;  continue present plan and medications.  5. Acquired hypothyroidism The current medical regimen is effective;  continue present plan and medications. - TSH  6. Vitamin D insufficiency The current medical regimen is effective;  continue present plan and medications. - VITAMIN D 25 Hydroxy (Vit-D Deficiency, Fractures); Future  7. Morbid obesity with BMI 35 and comorbidities (all) as listed above. Recommend work on weight loss.    Follow-up: Return in about 3 months (around 07/04/2019) for fasting. Also please schedule for Annual Wellness visit wtih Maudie Mercury, LPN..     AVS was given to patient prior to departure.  [name] [facility] [phone]

## 2019-04-04 LAB — COMPREHENSIVE METABOLIC PANEL
ALT: 12 IU/L (ref 0–32)
AST: 17 IU/L (ref 0–40)
Albumin/Globulin Ratio: 1.2 (ref 1.2–2.2)
Albumin: 3.6 g/dL — ABNORMAL LOW (ref 3.8–4.8)
Alkaline Phosphatase: 121 IU/L — ABNORMAL HIGH (ref 39–117)
BUN/Creatinine Ratio: 10 (ref 9–23)
BUN: 10 mg/dL (ref 6–24)
Bilirubin Total: 0.4 mg/dL (ref 0.0–1.2)
CO2: 24 mmol/L (ref 20–29)
Calcium: 8.7 mg/dL (ref 8.7–10.2)
Chloride: 98 mmol/L (ref 96–106)
Creatinine, Ser: 1.05 mg/dL — ABNORMAL HIGH (ref 0.57–1.00)
GFR calc Af Amer: 72 mL/min/{1.73_m2} (ref >59)
GFR calc non Af Amer: 62 mL/min/{1.73_m2} (ref >59)
Globulin, Total: 3.1 g/dL (ref 1.5–4.5)
Glucose: 110 mg/dL — ABNORMAL HIGH (ref 65–99)
Potassium: 3.5 mmol/L (ref 3.5–5.2)
Sodium: 138 mmol/L (ref 134–144)
Total Protein: 6.7 g/dL (ref 6.0–8.5)

## 2019-04-04 LAB — LIPID PANEL
Chol/HDL Ratio: 2.7 ratio (ref 0.0–4.4)
Cholesterol, Total: 158 mg/dL (ref 100–199)
HDL: 59 mg/dL (ref >39)
LDL Chol Calc (NIH): 82 mg/dL (ref 0–99)
Triglycerides: 95 mg/dL (ref 0–149)
VLDL Cholesterol Cal: 17 mg/dL (ref 5–40)

## 2019-04-04 LAB — CBC WITH DIFFERENTIAL/PLATELET
Basophils Absolute: 0 10*3/uL (ref 0.0–0.2)
Basos: 1 %
EOS (ABSOLUTE): 0.1 10*3/uL (ref 0.0–0.4)
Eos: 1 %
Hematocrit: 41.5 % (ref 34.0–46.6)
Hemoglobin: 13.4 g/dL (ref 11.1–15.9)
Immature Grans (Abs): 0 10*3/uL (ref 0.0–0.1)
Immature Granulocytes: 0 %
Lymphocytes Absolute: 1.9 10*3/uL (ref 0.7–3.1)
Lymphs: 22 %
MCH: 27.5 pg (ref 26.6–33.0)
MCHC: 32.3 g/dL (ref 31.5–35.7)
MCV: 85 fL (ref 79–97)
Monocytes Absolute: 0.4 10*3/uL (ref 0.1–0.9)
Monocytes: 4 %
Neutrophils Absolute: 6.3 10*3/uL (ref 1.4–7.0)
Neutrophils: 72 %
Platelets: 154 10*3/uL (ref 150–450)
RBC: 4.87 x10E6/uL (ref 3.77–5.28)
RDW: 14.1 % (ref 11.7–15.4)
WBC: 8.7 10*3/uL (ref 3.4–10.8)

## 2019-04-04 LAB — TSH: TSH: 2.07 u[IU]/mL (ref 0.450–4.500)

## 2019-04-04 LAB — CARDIOVASCULAR RISK ASSESSMENT

## 2019-04-04 LAB — HEMOGLOBIN A1C
Est. average glucose Bld gHb Est-mCnc: 114 mg/dL
Hgb A1c MFr Bld: 5.6 % (ref 4.8–5.6)

## 2019-04-08 ENCOUNTER — Other Ambulatory Visit: Payer: Self-pay | Admitting: Family Medicine

## 2019-04-08 DIAGNOSIS — Z1509 Genetic susceptibility to other malignant neoplasm: Secondary | ICD-10-CM

## 2019-04-08 DIAGNOSIS — Z1501 Genetic susceptibility to malignant neoplasm of breast: Secondary | ICD-10-CM

## 2019-04-11 ENCOUNTER — Other Ambulatory Visit: Payer: Self-pay | Admitting: Legal Medicine

## 2019-04-11 ENCOUNTER — Other Ambulatory Visit: Payer: Self-pay | Admitting: Family Medicine

## 2019-04-11 DIAGNOSIS — E1340 Other specified diabetes mellitus with diabetic neuropathy, unspecified: Secondary | ICD-10-CM | POA: Insufficient documentation

## 2019-04-11 DIAGNOSIS — E559 Vitamin D deficiency, unspecified: Secondary | ICD-10-CM | POA: Insufficient documentation

## 2019-04-11 DIAGNOSIS — E1140 Type 2 diabetes mellitus with diabetic neuropathy, unspecified: Secondary | ICD-10-CM | POA: Insufficient documentation

## 2019-04-11 DIAGNOSIS — Z1501 Genetic susceptibility to malignant neoplasm of breast: Secondary | ICD-10-CM

## 2019-04-11 DIAGNOSIS — F320 Major depressive disorder, single episode, mild: Secondary | ICD-10-CM | POA: Insufficient documentation

## 2019-04-11 DIAGNOSIS — Z1509 Genetic susceptibility to other malignant neoplasm: Secondary | ICD-10-CM

## 2019-04-11 DIAGNOSIS — E1169 Type 2 diabetes mellitus with other specified complication: Secondary | ICD-10-CM | POA: Insufficient documentation

## 2019-04-11 DIAGNOSIS — E039 Hypothyroidism, unspecified: Secondary | ICD-10-CM | POA: Insufficient documentation

## 2019-04-11 DIAGNOSIS — I10 Essential (primary) hypertension: Secondary | ICD-10-CM | POA: Insufficient documentation

## 2019-04-11 DIAGNOSIS — E782 Mixed hyperlipidemia: Secondary | ICD-10-CM | POA: Insufficient documentation

## 2019-04-11 DIAGNOSIS — E66812 Obesity, class 2: Secondary | ICD-10-CM | POA: Insufficient documentation

## 2019-04-11 DIAGNOSIS — Z6836 Body mass index (BMI) 36.0-36.9, adult: Secondary | ICD-10-CM | POA: Insufficient documentation

## 2019-04-22 ENCOUNTER — Other Ambulatory Visit: Payer: Self-pay | Admitting: Family Medicine

## 2019-04-22 DIAGNOSIS — Z7901 Long term (current) use of anticoagulants: Secondary | ICD-10-CM | POA: Diagnosis not present

## 2019-04-22 DIAGNOSIS — Z1501 Genetic susceptibility to malignant neoplasm of breast: Secondary | ICD-10-CM | POA: Diagnosis not present

## 2019-04-22 DIAGNOSIS — D649 Anemia, unspecified: Secondary | ICD-10-CM | POA: Diagnosis not present

## 2019-04-22 DIAGNOSIS — B182 Chronic viral hepatitis C: Secondary | ICD-10-CM | POA: Diagnosis not present

## 2019-04-22 DIAGNOSIS — K7460 Unspecified cirrhosis of liver: Secondary | ICD-10-CM | POA: Diagnosis not present

## 2019-04-29 ENCOUNTER — Other Ambulatory Visit: Payer: Self-pay | Admitting: Family Medicine

## 2019-04-29 DIAGNOSIS — Z6835 Body mass index (BMI) 35.0-35.9, adult: Secondary | ICD-10-CM

## 2019-04-29 DIAGNOSIS — Z1509 Genetic susceptibility to other malignant neoplasm: Secondary | ICD-10-CM

## 2019-04-29 DIAGNOSIS — Z1501 Genetic susceptibility to malignant neoplasm of breast: Secondary | ICD-10-CM

## 2019-05-02 DIAGNOSIS — K7689 Other specified diseases of liver: Secondary | ICD-10-CM | POA: Diagnosis not present

## 2019-05-02 DIAGNOSIS — K7460 Unspecified cirrhosis of liver: Secondary | ICD-10-CM | POA: Diagnosis not present

## 2019-05-03 ENCOUNTER — Other Ambulatory Visit: Payer: Self-pay

## 2019-05-03 MED ORDER — MORPHINE SULFATE ER 30 MG PO TBCR: 30.0000 mg | EXTENDED_RELEASE_TABLET | Freq: Two times a day (BID) | ORAL | 0 refills | Status: DC

## 2019-05-07 ENCOUNTER — Ambulatory Visit: Payer: PPO

## 2019-05-07 ENCOUNTER — Telehealth: Payer: Self-pay | Admitting: Family Medicine

## 2019-05-07 NOTE — Progress Notes (Signed)
  Chronic Care Management   Outreach Note  05/07/2019 Name: Linda [name] MRN: [id number] DOB: Mar 31, 1970  Referred by: [name], MD Reason for referral : No chief complaint on file.   An unsuccessful telephone outreach was attempted today. The patient was referred to the pharmacist for assistance with care management and care coordination.   Follow Up Plan:   Earney Hamburg Upstream Scheduler

## 2019-05-08 DIAGNOSIS — D500 Iron deficiency anemia secondary to blood loss (chronic): Secondary | ICD-10-CM | POA: Diagnosis not present

## 2019-05-09 DIAGNOSIS — R195 Other fecal abnormalities: Secondary | ICD-10-CM | POA: Diagnosis not present

## 2019-05-09 DIAGNOSIS — K2961 Other gastritis with bleeding: Secondary | ICD-10-CM | POA: Diagnosis not present

## 2019-05-09 DIAGNOSIS — D500 Iron deficiency anemia secondary to blood loss (chronic): Secondary | ICD-10-CM | POA: Diagnosis not present

## 2019-05-09 DIAGNOSIS — R197 Diarrhea, unspecified: Secondary | ICD-10-CM | POA: Diagnosis not present

## 2019-05-16 ENCOUNTER — Other Ambulatory Visit: Payer: Self-pay | Admitting: Family Medicine

## 2019-05-16 DIAGNOSIS — Z1509 Genetic susceptibility to other malignant neoplasm: Secondary | ICD-10-CM

## 2019-05-16 DIAGNOSIS — Z1501 Genetic susceptibility to malignant neoplasm of breast: Secondary | ICD-10-CM

## 2019-05-20 ENCOUNTER — Other Ambulatory Visit: Payer: Self-pay | Admitting: Family Medicine

## 2019-05-20 ENCOUNTER — Telehealth: Payer: Self-pay | Admitting: Family Medicine

## 2019-05-20 DIAGNOSIS — Z1501 Genetic susceptibility to malignant neoplasm of breast: Secondary | ICD-10-CM

## 2019-05-20 NOTE — Progress Notes (Signed)
  Chronic Care Management   Outreach Note  05/20/2019 Name: Linda [name] MRN: [id number] DOB: 01-10-1971  Referred by: [name], MD Reason for referral : No chief complaint on file.   An unsuccessful telephone outreach was attempted today. The patient was referred to the pharmacist for assistance with care management and care coordination.   This note is not being shared with the patient for the following reason: To respect privacy (The patient or proxy has requested that the information not be shared).  Follow Up Plan:   Earney Hamburg Upstream Scheduler

## 2019-05-28 ENCOUNTER — Telehealth: Payer: Self-pay | Admitting: Family Medicine

## 2019-05-28 ENCOUNTER — Ambulatory Visit: Payer: PPO

## 2019-05-28 NOTE — Progress Notes (Signed)
°  Chronic Care Management   Note  05/28/2019 Name: Linda [name] MRN: [id number] DOB: 1970-06-10  Linda [name] is a 49 y.o. year old female who is a primary care patient of [name], Kirsten, MD. I reached out to Linda [name] by phone today in response to a referral sent by Ms. Kevan Rosebush Fass's PCP, [name], Kirsten, MD.   Ms. Pint was given information about Chronic Care Management services today including:  1. CCM service includes personalized support from designated clinical staff supervised by her physician, including individualized plan of care and coordination with other care providers 2. 24/7 contact phone numbers for assistance for urgent and routine care needs. 3. Service will only be billed when office clinical staff spend 20 minutes or more in a month to coordinate care. 4. Only one practitioner may furnish and bill the service in a calendar month. 5. The patient may stop CCM services at any time (effective at the end of the month) by phone call to the office staff.   Patient agreed to services and verbal consent obtained.   This note is not being shared with the patient for the following reason: To respect privacy (The patient or proxy has requested that the information not be shared).  Follow up plan:   Earney Hamburg Upstream Scheduler

## 2019-06-04 ENCOUNTER — Other Ambulatory Visit: Payer: Self-pay

## 2019-06-04 ENCOUNTER — Other Ambulatory Visit: Payer: Self-pay | Admitting: Family Medicine

## 2019-06-04 DIAGNOSIS — Z1501 Genetic susceptibility to malignant neoplasm of breast: Secondary | ICD-10-CM

## 2019-06-04 MED ORDER — MORPHINE SULFATE ER 30 MG PO TBCR: 30.0000 mg | EXTENDED_RELEASE_TABLET | Freq: Two times a day (BID) | ORAL | 0 refills | Status: DC

## 2019-06-10 ENCOUNTER — Other Ambulatory Visit: Payer: Self-pay | Admitting: Family Medicine

## 2019-06-25 ENCOUNTER — Other Ambulatory Visit: Payer: Self-pay | Admitting: Family Medicine

## 2019-06-25 NOTE — Chronic Care Management (AMB) (Signed)
Chronic Care Management Pharmacy  Name: Linda [name]  MRN: [id number] DOB: 1970-04-18  Chief Complaint/ HPI  Linda [name],  49 y.o. , female presents for their Initial CCM visit with the clinical pharmacist via telephone due to COVID-19 Pandemic.  PCP : [name], MD  Their chronic conditions include: HTN, Dyslipidemia, Hypothyroidism, BRCA 1 positive, HLD, Depression, Vitamin D insufficiency, GERD, Gastritis, Anxiety, DM.  Office Visits: 04/03/2019 - no med changes.  02/05/2019 - follow-up after ED for stomach pain 01/01/2020 - Well controlled - no med changes.  Consult Visit: 05/10/2019 - GI - increased diarrhea. Stool studies ordered, equilactin and loperamide recommended. Increase pantoprazole to bid.  04/22/2019 - Oncology - hep a,b and c negative. Routine monitoring of breast since not undergoing double mastectomy.  03/29/2019 - NASH - recommended hep a and b vaccine  03/04/2019 - podiatry for callus of toe and nail care.  01/29/2019 -ED visit for stomach pain. 12/17/2019 - no retinopathy on eye exam  Medications: Outpatient Encounter Medications as of 06/26/2019  Medication Sig Note  . buPROPion (WELLBUTRIN XL) 300 MG 24 hr tablet TAKE 1 TABLET ONCE DAILY. (Patient taking differently: Take 300 mg by mouth every evening. )   . cetirizine (ZYRTEC) 10 MG tablet Take 10 mg by mouth daily as needed (seasonal allergies).    . clotrimazole-betamethasone (LOTRISONE) cream Apply small amount to affected area twice daily (Patient taking differently: Apply 1 application topically daily as needed. )   . dicyclomine (BENTYL) 20 MG tablet TAKE ONE TABLET BY MOUTH BEFORE meals AND AT bedtime as needed FOR stomach cramping.   . Empagliflozin-metFORMIN HCl (SYNJARDY) 05-998 MG TABS Take 1 tablet by mouth in the morning. Synjardy    . famotidine (PEPCID) 20 MG tablet TAKE ONE TABLET BY MOUTH TWICE DAILY   . ferrous sulfate 325 (65 FE) MG tablet Take 325 mg by mouth every  evening.   Marland Kitchen [place] 80 MG CP24 TAKE ONE CAPSULE BY MOUTH EVERY DAY   . gabapentin (NEURONTIN) 300 MG capsule TAKE ONE CAPSULE BY MOUTH THREE TIMES DAILY (Patient taking differently: Take 300 mg by mouth 2 (two) times daily. )   . glucose blood test strip 1 each by Other route in the morning and at bedtime. One Touch Verio - Patient checks blood sugar 2-3 times daily.   . insulin glargine (LANTUS) 100 unit/mL SOPN Inject 68 Units into the skin at bedtime.   Marland Kitchen levothyroxine (SYNTHROID) 75 MCG tablet TAKE ONE TABLET BY MOUTH DAILY   . losartan (COZAAR) 50 MG tablet TAKE ONE TABLET BY MOUTH EVERY DAY (Patient taking differently: Take 50 mg by mouth every evening. )   . methocarbamol (ROBAXIN) 500 MG tablet TAKE ONE TABLET BY MOUTH TWICE DAILY (Patient taking differently: Take 500 mg by mouth in the morning and at bedtime. )   . morphine (MS CONTIN) 30 MG 12 hr tablet Take 1 tablet (30 mg total) by mouth every 12 (twelve) hours.   . Multiple Vitamin (MULTIVITAMIN WITH MINERALS) TABS tablet Take 1 tablet by mouth daily. 06/08/2018: Pt plans on purchasing again when she can get out.  . ondansetron (ZOFRAN) 4 MG tablet TAKE ONE TABLET BY MOUTH FOUR TIMES DAILY (Patient taking differently: Take 4 mg by mouth 4 (four) times daily as needed for nausea. )   . OneTouch Delica Lancets 79X MISC by Does not apply route. Check blood sugar twice daily.   . pantoprazole (PROTONIX) 40 MG tablet Take 40 mg by mouth 2 (  two) times daily.    . potassium chloride (KLOR-CON) 10 MEQ tablet TAKE TWO TABLETS BY MOUTH TWICE DAILY   . pravastatin (PRAVACHOL) 20 MG tablet Take 20 mg by mouth at bedtime.    . raloxifene (EVISTA) 60 MG tablet Take 60 mg by mouth daily.   Marland Kitchen Specialty Vitamins Products (MAGNESIUM, AMINO ACID CHELATE,) 133 MG tablet Take 1 tablet by mouth at bedtime.   . sucralfate (CARAFATE) 1 g tablet Take 1 g by mouth 4 (four) times daily -  with meals and at bedtime.    . TRULICITY 4.48 JE/5.6DJ SOPN INJECT ONE  syringe ONCE WEEKLY (Patient taking differently: Inject 0.75 mg into the skin once a week. Takes on Saturdays.)   . Vitamin D, Ergocalciferol, (DRISDOL) 50000 units CAPS capsule Take 50,000 Units by mouth every Friday. Friday nights   . zolpidem (AMBIEN) 10 MG tablet TAKE ONE TABLET BY MOUTH AT BEDTIME AS NEEDED   . potassium chloride (MICRO-K) 10 MEQ CR capsule Take 20 mEq by mouth 2 (two) times daily.    . [DISCONTINUED] FETZIMA 80 MG CP24 TAKE ONE CAPSULE BY MOUTH EVERY DAY    No facility-administered encounter medications on file as of 06/26/2019.   Allergies  Allergen Reactions  . Codeine Shortness Of Breath  . Celecoxib Other (See Comments)    Unknown reaction  . Ezetimibe-Simvastatin Other (See Comments)    Unknown reaction  . Propranolol Hcl Other (See Comments)    Unknown reaction   SDOH Screenings   Alcohol Screen:   . Last Alcohol Screening Score (AUDIT):   Depression (PHQ2-9): Medium Risk  . PHQ-2 Score: 11  Financial Resource Strain:   . Difficulty of Paying Living Expenses:   Food Insecurity: No Food Insecurity  . Worried About Charity fundraiser in the Last Year: Never true  . Ran Out of Food in the Last Year: Never true  Housing: Low Risk   . Last Housing Risk Score: 0  Physical Activity:   . Days of Exercise per Week:   . Minutes of Exercise per Session:   Social Connections:   . Frequency of Communication with Friends and Family:   . Frequency of Social Gatherings with Friends and Family:   . Attends Religious Services:   . Active Member of Clubs or Organizations:   . Attends Archivist Meetings:   Marland Kitchen Marital Status:   Stress:   . Feeling of Stress :   Tobacco Use: Low Risk   . Smoking Tobacco Use: Never Smoker  . Smokeless Tobacco Use: Never Used  Transportation Needs:   . Film/video editor (Medical):   Marland Kitchen Lack of Transportation (Non-Medical):    Current Diagnosis/Assessment:  Goals Addressed            This Visit's Progress   .  Pharmacy Care Plan       CARE PLAN ENTRY  Current Barriers:  . Chronic Disease Management support, education, and care coordination needs related to Hypertension, Hyperlipidemia, and Diabetes   Hypertension . Pharmacist Clinical Goal(s): o Over the next 90 days, patient will work with PharmD and providers to maintain BP goal <130/80 . Current regimen:  o Losartan 50 mg daily . Interventions: o Recommend patient work on resuming healthier eating as she gets settled in her new home/routine.   . Patient self care activities - Over the next 90 days, patient will: o Check BP monthly, document, and provide at future appointments o Ensure daily salt intake < 2300 mg/day  Hyperlipidemia . Pharmacist Clinical Goal(s): o Over the next 90 days, patient will work with PharmD and providers to maintain LDL goal < 100 . Current regimen:  o Pravastatin 20 mg daily in the evening.  . Interventions: o Recommend resuming healthier eating daily as she gets settled.  . Patient self care activities - Over the next 90 days, patient will: o Work toward goal of attending water aerobics at the [employer] via [employer].   Diabetes . Pharmacist Clinical Goal(s): o Over the next 90 days, patient will work with PharmD and providers to maintain A1c goal <7% . Current regimen:  o Trulicity 6.04 mg weekly. Synjardy 05-998 mg daily, Lantus.  . Interventions: o Recommend setting an alarm to remind for weekly dose of Trulicity.  . Patient self care activities - Over the next 90 days, patient will: o Check blood sugar twice daily, document, and provide at future appointments o Contact provider with any episodes of hypoglycemia  Medication management . Pharmacist Clinical Goal(s): o Over the next 90  days, patient will work with PharmD and providers to achieve optimal medication adherence . Current pharmacy: [facility] . Interventions o Comprehensive medication review performed. o Utilize [facility] for medication synchronization, packaging and delivery . Patient self care activities - Over the next 90 days, patient will: o Focus on medication adherence by utilizing pharmacy delivery o Take medications as prescribed o Report any questions or concerns to PharmD and/or provider(s)  Initial goal documentation       Hyperlipidemia   Lipid Panel     Component Value Date/Time   CHOL 158 04/03/2019 1136   TRIG 95 04/03/2019 1136   HDL 59 04/03/2019 1136   LDLCALC 82 04/03/2019 1136     The 10-year ASCVD risk score Mikey Bussing DC Jr., et al., 2013) is: 1.8%   Values used to calculate the score:     Age: 50 years     Sex: Female     Is Non-Hispanic African American: No     Diabetic: Yes     Tobacco smoker: No     Systolic Blood Pressure: 540 mmHg     Is BP treated: Yes     HDL Cholesterol: 59 mg/dL     Total Cholesterol: 158 mg/dL   Patient has failed these meds in past: n/a Patient is currently controlled on the following medications:  . Pravastatin 20 mg daily qhs  We discussed:  diet and exercise extensively. Patient has moved recently and still settling in so has been eating much more fast food lately. She would like to begin getting back to exercise once settled. Currently she is getting a lot of activity moving and unpacking boxes.   Plan  Continue current medications   Diabetes   Recent Relevant Labs: Lab Results  Component Value Date/Time   HGBA1C 5.6 04/03/2019 11:36 AM   HGBA1C 9.1 (H) 06/13/2016 08:30 AM    Checking BG: Daily  Recent FBG Readings: has not been checking.  Recent HS BG readings: 156 Patient has failed these meds in past: metformin Patient is currently controlled on the following medications: synjardy 05-998 mg daily, lantus 68 units qhs, trulicity 9.81 mg weekly  Last diabetic Foot exam: March 2021 Last diabetic Eye exam: 12/17/2018   We discussed: diet and exercise extensively Was having bad diarrhea when taking Synjardy once  daily due to diarrhea. She has been self adjusting lantus based on blood sugar readings gives 0 units if blood sugar is 100-120 mg/dL  may give 20-40 units if blood sugar in the 150s in the evening. She forgets Trulicity doses on Saturdays. We discussed setting an alarm on her phone each week to remind her. She is going to begin writing down how many units of Lantus she uses each day and correlating blood sugars. She will bring this to her appointment with Dr. Tobie Poet to determine a more stable/consistent Lantus dose. .  Plan  Continue current medications,   Hypothyroidism   Lab Results  Component Value Date/Time   TSH 2.070 04/03/2019 11:36 AM    Patient has failed these meds in past: n/a Patient is currently controlled on the following medications:  . Levothyroxine 75 mcg daily   We discussed: Patient takes levothyroxine each morning. She denies missed doses.   Plan  Continue current medications   Hypertension   BP today is:  <140/90  Office blood pressures are  BP Readings from Last 3 Encounters:  04/03/19 116/64  06/08/18 134/62  07/06/16 (!) 159/79    Patient has failed these meds in the past: lisinopril 10 mg daily Patient is currently controlled on the following medications: losartan 50 mg daily Patient checks BP at home several times per month  Patient home BP readings are ranging: 155/95 mmHg  We discussed diet and exercise extensively. Blood pressure was elevated last week when sugar went up one day, increased pain and patient had a headache.   Plan  Continue current medications     and  Other Diagnosis: Chronic Pain Syndrome   Patient has failed these meds in past: naproxen,  Patient is currently controlled on the following medications: morphine er 30 mg bid, gabapentin 300 mg tid  We discussed: Patient struggles with pain and neuropathy. She has tried cutting back in the past to once daily but pain increased. She is managing on twice daily right now. She has  occasional days that she requires taking 3 times daily. Patient is very concerned with her memory being affected by gabapentin and morphine. Patient rates pain as a 5/10 on a day to day basis. She can tell a big difference when she does a lot of physical labor or housework with her back pain. Water aerobics in the past has alleviated her back pain. She is not currently doing water aerobics. We discussed silver sneakers and the [employer] once she gets settled in her apartment.   Plan  Continue current medications  Depression   Patient has failed these meds in past: n/a Patient is currently controlled on the following medications:  . Bupropion xl 300 mg daily . Fetzima 80 mg daily   We discussed:  Has had increased stress and anxiety with her recent move. She is feeling better since getting settled in her new apartment.   Plan  Continue current medications  GERD/Gastritis   Patient has failed these meds in past: n/a Patient is currently controlled on the following medications:  . Dicyclomine 20 mg qid prn for spasms . Famotidine 20 mg bid . Zofran 4 mg qid prn - nausea . Pantoprazole 40 mg bid  . Sucralfate 1 gm qid  We discussed: Patient sees Dr. Melina Copa for stomach spasms/issues. She is seeing him later this month to determine if she has IBS. It seems that the flares and poor diet correlate with episodes of stress. Dr. Melina Copa told patient that she could stop famotidine if she would like since on pantoprazole. Patient would like to trial a few days stopping famotidine. Patient never struggles with constipation.  Plan  Continue current medications  Health Maintenance   Patient is currently controlled on the following medications:  Marland Kitchen Zolpidem 10 mg daily - insomnia . Magnesium daily at bedtime- insomnia . Evista 60 mg daily - breast cancer prevention  . Methocarbamol 500 mg bid -  Back spasms . Multivitamin daily - general health . Potassium chloride 10 meq bid - Low  potassium . Vitamin D 50,000 IU weekly - Low vitamin D . Cetrizine 10 mg daily prn - seasonal allergies . Ferrous sulfate 325 mg daily  . Clotrimazole-betamethasone bid - rash under breast   We discussed:  Recommend patient begin eating with ferrous sulfate.   Plan  Continue current medications  Vaccines   Reviewed and discussed patient's vaccination history. Has had both [place].   Immunization History  Administered Date(s) Administered  . Influenza-Unspecified 09/14/2018    Plan  Recommended patient receive annual flu vaccine in office.   Medication Management   Pt uses [place] for all medications Uses pill box? Yes Pt endorses decent compliance  We discussed: Verbal consent obtained for [facility] enhanced pharmacy services (medication synchronization, adherence packaging, delivery coordination). A medication sync plan was created to allow patient to get all medications delivered once every 30 to 90 days per patient preference. Patient understands they have freedom to choose pharmacy and clinical pharmacist will coordinate care between all prescribers and [facility].   Plan  Utilize [facility] for medication synchronization, packaging and delivery    Follow up: 1 month phone visit

## 2019-06-26 ENCOUNTER — Ambulatory Visit: Payer: PPO

## 2019-06-26 DIAGNOSIS — E782 Mixed hyperlipidemia: Secondary | ICD-10-CM

## 2019-06-26 DIAGNOSIS — I10 Essential (primary) hypertension: Secondary | ICD-10-CM

## 2019-06-26 NOTE — Patient Instructions (Addendum)
Visit Information  Thank you for your time discussing your medications. I look forward to working with you to achieve your health care goals. Below is a summary of what we talked about during our visit.   Goals Addressed            This Visit's Progress   . Pharmacy Care Plan       CARE PLAN ENTRY  Current Barriers:  . Chronic Disease Management support, education, and care coordination needs related to Hypertension, Hyperlipidemia, and Diabetes   Hypertension . Pharmacist Clinical Goal(s): o Over the next 90 days, patient will work with PharmD and providers to maintain BP goal <130/80 . Current regimen:  o Losartan 50 mg daily . Interventions: o Recommend patient work on resuming healthier eating as she gets settled in her new home/routine.   . Patient self care activities - Over the next 90 days, patient will: o Check BP monthly, document, and provide at future appointments o Ensure daily salt intake < 2300 mg/day  Hyperlipidemia . Pharmacist Clinical Goal(s): o Over the next 90 days, patient will work with PharmD and providers to maintain LDL goal < 100 . Current regimen:  o Pravastatin 20 mg daily in the evening.  . Interventions: o Recommend resuming healthier eating daily as she gets settled.  . Patient self care activities - Over the next 90 days, patient will: o Work toward goal of attending water aerobics at the [employer] via [employer].   Diabetes . Pharmacist Clinical Goal(s): o Over the next 90 days, patient will work with PharmD and providers to maintain A1c goal <7% . Current regimen:  o Trulicity 8.18 mg weekly. Synjardy 05-998 mg daily, Lantus.  . Interventions: o Recommend setting an alarm to remind for weekly dose of Trulicity.  . Patient self care activities - Over the next 90 days, patient will: o Check blood sugar twice daily, document, and provide at future appointments o Contact provider with any episodes of hypoglycemia  Medication  management . Pharmacist Clinical Goal(s): o Over the next 90  days, patient will work with PharmD and providers to achieve optimal medication adherence . Current pharmacy: [facility] . Interventions o Comprehensive medication review performed. o Utilize [facility] for medication synchronization, packaging and delivery . Patient self care activities - Over the next 90 days, patient will: o Focus on medication adherence by utilizing pharmacy delivery o Take medications as prescribed o Report any questions or concerns to PharmD and/or provider(s)  Initial goal documentation        Ms. Laufer was given information about Chronic Care Management services today including:  1. CCM service includes personalized support from designated clinical staff supervised by her physician, including individualized plan of care and coordination with other care providers 2. 24/7 contact phone numbers for assistance for urgent and routine care needs. 3. Standard insurance, coinsurance, copays and deductibles apply for chronic care management only during months in which we provide at least 20 minutes of these services. Most insurances cover these services at 100%, however patients may be responsible for any copay, coinsurance and/or deductible if applicable. This service may help you avoid the need for more expensive face-to-face services. 4. Only one practitioner may furnish and bill the service in a calendar month. 5. The patient may stop CCM services at any time (effective at the end of the month) by phone call to the office staff.  Patient agreed to services and verbal consent obtained.   The patient verbalized understanding  of instructions provided today and agreed to receive a mailed copy of patient instruction and/or educational materials. Telephone follow up appointment with pharmacy team member scheduled for: 07/2019  Sherre Poot, PharmD Clinical Pharmacist [name] [phone] (office) [phone] (mobile)   Diabetes Mellitus and Nutrition, Adult When you have diabetes (diabetes mellitus), it is very important to have healthy eating habits because your blood sugar (glucose) levels are greatly affected by what you eat and drink. Eating healthy foods in the appropriate amounts, at about the same times every day, can help you:  Control your blood glucose.  Lower your risk of heart disease.  Improve your blood pressure.  Reach or maintain a healthy weight. Every person with diabetes is different, and each person has different needs for a meal plan. Your health care provider may recommend that you work with a diet and nutrition specialist (dietitian) to make a meal plan that is best for you. Your meal plan may vary depending on factors such as:  The calories you need.  The medicines you take.  Your weight.  Your blood glucose, blood pressure, and cholesterol levels.  Your activity level.  Other health conditions you have, such as heart or kidney disease. How do carbohydrates affect me? Carbohydrates, also called carbs, affect your blood glucose level more than any other type of food. Eating carbs naturally raises the amount of glucose in your blood. Carb counting is a method for keeping track of how many carbs you eat. Counting carbs is important to keep your blood glucose at a healthy level, especially if you use insulin or take certain oral diabetes medicines. It is important to know how many carbs you can safely have in each meal. This is different for every person. Your dietitian can help you calculate how many carbs you should have at each meal and for each snack. Foods that contain carbs include:  Bread, cereal, rice, pasta, and crackers.  Potatoes and corn.  Peas, beans, and lentils.  Milk and yogurt.  Fruit and juice.  Desserts, such as cakes, cookies, ice cream, and candy. How does alcohol affect me? Alcohol can cause  a sudden decrease in blood glucose (hypoglycemia), especially if you use insulin or take certain oral diabetes medicines. Hypoglycemia can be a life-threatening condition. Symptoms of hypoglycemia (sleepiness, dizziness, and confusion) are similar to symptoms of having too much alcohol. If your health care provider says that alcohol is safe for you, follow these guidelines:  Limit alcohol intake to no more than 1 drink per day for nonpregnant women and 2 drinks per day for men. One drink equals 12 oz of beer, 5 oz of wine, or 1 oz of hard liquor.  Do not drink on an empty stomach.  Keep yourself hydrated with water, diet soda, or unsweetened iced tea.  Keep in mind that regular soda, juice, and other mixers may contain a lot of sugar and must be counted as carbs. What are tips for following this plan?  Reading food labels  Start by checking the serving size on the "Nutrition Facts" label of packaged foods and drinks. The amount of calories, carbs, fats, and other nutrients listed on the label is based on one serving of the item. Many items contain more than one serving per package.  Check the total grams (g) of carbs in one serving. You can calculate the number of servings of carbs in one serving by dividing the total carbs by 15. For example, if a food has  30 g of total carbs, it would be equal to 2 servings of carbs.  Check the number of grams (g) of saturated and trans fats in one serving. Choose foods that have low or no amount of these fats.  Check the number of milligrams (mg) of salt (sodium) in one serving. Most people should limit total sodium intake to less than 2,300 mg per day.  Always check the nutrition information of foods labeled as "low-fat" or "nonfat". These foods may be higher in added sugar or refined carbs and should be avoided.  Talk to your dietitian to identify your daily goals for nutrients listed on the label. Shopping  Avoid buying canned, premade, or processed  foods. These foods tend to be high in fat, sodium, and added sugar.  Shop around the outside edge of the grocery store. This includes fresh fruits and vegetables, bulk grains, fresh meats, and fresh dairy. Cooking  Use low-heat cooking methods, such as baking, instead of high-heat cooking methods like deep frying.  Cook using healthy oils, such as olive, canola, or sunflower oil.  Avoid cooking with butter, cream, or high-fat meats. Meal planning  Eat meals and snacks regularly, preferably at the same times every day. Avoid going long periods of time without eating.  Eat foods high in fiber, such as fresh fruits, vegetables, beans, and whole grains. Talk to your dietitian about how many servings of carbs you can eat at each meal.  Eat 4-6 ounces (oz) of lean protein each day, such as lean meat, chicken, fish, eggs, or tofu. One oz of lean protein is equal to: ? 1 oz of meat, chicken, or fish. ? 1 egg. ?  cup of tofu.  Eat some foods each day that contain healthy fats, such as avocado, nuts, seeds, and fish. Lifestyle  Check your blood glucose regularly.  Exercise regularly as told by your health care provider. This may include: ? 150 minutes of moderate-intensity or vigorous-intensity exercise each week. This could be brisk walking, biking, or water aerobics. ? Stretching and doing strength exercises, such as yoga or weightlifting, at least 2 times a week.  Take medicines as told by your health care provider.  Do not use any products that contain nicotine or tobacco, such as cigarettes and e-cigarettes. If you need help quitting, ask your health care provider.  Work with a Social worker or diabetes educator to identify strategies to manage stress and any emotional and social challenges. Questions to ask a health care provider  Do I need to meet with a diabetes educator?  Do I need to meet with a dietitian?  What number can I call if I have questions?  When are the best times  to check my blood glucose? Where to find more information:  American Diabetes Association: diabetes.org  Academy of Nutrition and Dietetics: www.eatright.[employer] of Diabetes and Digestive and Kidney Diseases (NIH): [employer] Summary  A healthy meal plan will help you control your blood glucose and maintain a healthy lifestyle.  Working with a diet and nutrition specialist (dietitian) can help you make a meal plan that is best for you.  Keep in mind that carbohydrates (carbs) and alcohol have immediate effects on your blood glucose levels. It is important to count carbs and to use alcohol carefully. This information is not intended to replace advice given to you by your health care provider. Make sure you discuss any questions you have with your health care provider. Document Revised: 12/23/2016 Document Reviewed: 02/15/2016 Elsevier  Patient Education  [employer].

## 2019-06-27 ENCOUNTER — Other Ambulatory Visit: Payer: Self-pay

## 2019-06-27 DIAGNOSIS — Z1501 Genetic susceptibility to malignant neoplasm of breast: Secondary | ICD-10-CM

## 2019-06-27 DIAGNOSIS — Z6835 Body mass index (BMI) 35.0-35.9, adult: Secondary | ICD-10-CM

## 2019-06-27 MED ORDER — GLUCOSE BLOOD VI STRP: 1.0000 | ORAL_STRIP | Freq: Two times a day (BID) | 2 refills | Status: DC

## 2019-06-27 MED ORDER — METHOCARBAMOL 500 MG PO TABS: 500.0000 mg | ORAL_TABLET | Freq: Two times a day (BID) | ORAL | 1 refills | Status: DC

## 2019-06-27 MED ORDER — LEVOTHYROXINE SODIUM 75 MCG PO TABS: 75.0000 ug | ORAL_TABLET | Freq: Every day | ORAL | 1 refills | Status: DC

## 2019-06-27 MED ORDER — MORPHINE SULFATE ER 30 MG PO TBCR: 30.0000 mg | EXTENDED_RELEASE_TABLET | Freq: Two times a day (BID) | ORAL | 0 refills | Status: DC

## 2019-06-27 MED ORDER — SYNJARDY 5-1000 MG PO TABS: 1.0000 | ORAL_TABLET | Freq: Every morning | ORAL | 1 refills | Status: DC

## 2019-06-27 MED ORDER — DICYCLOMINE HCL 20 MG PO TABS: ORAL_TABLET | ORAL | 1 refills | Status: DC

## 2019-06-27 MED ORDER — POTASSIUM CHLORIDE ER 10 MEQ PO CPCR: 20.0000 meq | ORAL_CAPSULE | Freq: Two times a day (BID) | ORAL | 1 refills | Status: DC

## 2019-06-27 MED ORDER — CLOTRIMAZOLE-BETAMETHASONE 1-0.05 % EX CREA: TOPICAL_CREAM | CUTANEOUS | 0 refills | Status: DC

## 2019-06-27 MED ORDER — ONETOUCH DELICA LANCETS 30G MISC: 1.0000 | Freq: Every day | 3 refills | Status: DC

## 2019-06-27 MED ORDER — FETZIMA 80 MG PO CP24: 1.0000 | ORAL_CAPSULE | Freq: Every day | ORAL | 1 refills | Status: DC

## 2019-06-27 MED ORDER — BUPROPION HCL ER (XL) 300 MG PO TB24: 300.0000 mg | ORAL_TABLET | Freq: Every evening | ORAL | 1 refills | Status: DC

## 2019-06-27 MED ORDER — SUCRALFATE 1 G PO TABS: 1.0000 g | ORAL_TABLET | Freq: Three times a day (TID) | ORAL | 1 refills | Status: DC

## 2019-06-27 MED ORDER — TRULICITY 0.75 MG/0.5ML ~~LOC~~ SOAJ: SUBCUTANEOUS | 1 refills | Status: DC

## 2019-06-27 MED ORDER — ZOLPIDEM TARTRATE 10 MG PO TABS: 10.0000 mg | ORAL_TABLET | Freq: Every evening | ORAL | 0 refills | Status: DC | PRN

## 2019-06-27 MED ORDER — ONDANSETRON HCL 4 MG PO TABS: 4.0000 mg | ORAL_TABLET | Freq: Four times a day (QID) | ORAL | 1 refills | Status: DC | PRN

## 2019-06-27 MED ORDER — INSULIN GLARGINE 100 UNITS/ML SOLOSTAR PEN: 68.0000 [IU] | PEN_INJECTOR | Freq: Every day | SUBCUTANEOUS | 2 refills | Status: DC

## 2019-06-27 MED ORDER — PANTOPRAZOLE SODIUM 40 MG PO TBEC: 40.0000 mg | DELAYED_RELEASE_TABLET | Freq: Two times a day (BID) | ORAL | 1 refills | Status: DC

## 2019-06-27 MED ORDER — FAMOTIDINE 20 MG PO TABS: 20.0000 mg | ORAL_TABLET | Freq: Two times a day (BID) | ORAL | 1 refills | Status: DC

## 2019-06-27 MED ORDER — VITAMIN D (ERGOCALCIFEROL) 1.25 MG (50000 UNIT) PO CAPS: 50000.0000 [IU] | ORAL_CAPSULE | ORAL | 1 refills | Status: DC

## 2019-06-27 MED ORDER — PRAVASTATIN SODIUM 20 MG PO TABS: 20.0000 mg | ORAL_TABLET | Freq: Every day | ORAL | 1 refills | Status: DC

## 2019-06-27 MED ORDER — GABAPENTIN 300 MG PO CAPS: 300.0000 mg | ORAL_CAPSULE | Freq: Two times a day (BID) | ORAL | 1 refills | Status: DC

## 2019-06-27 MED ORDER — LOSARTAN POTASSIUM 50 MG PO TABS: 50.0000 mg | ORAL_TABLET | Freq: Every evening | ORAL | 1 refills | Status: DC

## 2019-06-27 MED ORDER — RALOXIFENE HCL 60 MG PO TABS: 60.0000 mg | ORAL_TABLET | Freq: Every day | ORAL | 1 refills | Status: DC

## 2019-06-28 ENCOUNTER — Other Ambulatory Visit: Payer: Self-pay

## 2019-06-28 DIAGNOSIS — E1169 Type 2 diabetes mellitus with other specified complication: Secondary | ICD-10-CM

## 2019-06-28 MED ORDER — INSULIN GLARGINE 100 UNITS/ML SOLOSTAR PEN: 68.0000 [IU] | PEN_INJECTOR | Freq: Every day | SUBCUTANEOUS | 2 refills | Status: DC

## 2019-07-08 ENCOUNTER — Other Ambulatory Visit: Payer: Self-pay

## 2019-07-08 ENCOUNTER — Encounter: Payer: Self-pay | Admitting: Family Medicine

## 2019-07-08 ENCOUNTER — Ambulatory Visit (INDEPENDENT_AMBULATORY_CARE_PROVIDER_SITE_OTHER): Payer: PPO | Admitting: Family Medicine

## 2019-07-08 VITALS — BP 122/82 | HR 87 | Temp 96.5°F | Ht 62.0 in | Wt 207.0 lb

## 2019-07-08 DIAGNOSIS — I10 Essential (primary) hypertension: Secondary | ICD-10-CM

## 2019-07-08 DIAGNOSIS — F1120 Opioid dependence, uncomplicated: Secondary | ICD-10-CM | POA: Diagnosis not present

## 2019-07-08 DIAGNOSIS — E782 Mixed hyperlipidemia: Secondary | ICD-10-CM

## 2019-07-08 DIAGNOSIS — E1169 Type 2 diabetes mellitus with other specified complication: Secondary | ICD-10-CM | POA: Diagnosis not present

## 2019-07-08 DIAGNOSIS — E785 Hyperlipidemia, unspecified: Secondary | ICD-10-CM

## 2019-07-08 DIAGNOSIS — F330 Major depressive disorder, recurrent, mild: Secondary | ICD-10-CM

## 2019-07-08 DIAGNOSIS — E039 Hypothyroidism, unspecified: Secondary | ICD-10-CM

## 2019-07-08 DIAGNOSIS — G894 Chronic pain syndrome: Secondary | ICD-10-CM

## 2019-07-08 DIAGNOSIS — R413 Other amnesia: Secondary | ICD-10-CM | POA: Diagnosis not present

## 2019-07-08 DIAGNOSIS — F333 Major depressive disorder, recurrent, severe with psychotic symptoms: Secondary | ICD-10-CM | POA: Insufficient documentation

## 2019-07-08 DIAGNOSIS — Z6835 Body mass index (BMI) 35.0-35.9, adult: Secondary | ICD-10-CM | POA: Diagnosis not present

## 2019-07-08 DIAGNOSIS — F332 Major depressive disorder, recurrent severe without psychotic features: Secondary | ICD-10-CM | POA: Insufficient documentation

## 2019-07-08 LAB — POCT UA - MICROALBUMIN: Microalbumin Ur, POC: 80 mg/L

## 2019-07-08 NOTE — Progress Notes (Signed)
Subjective:  Patient ID: Linda [name], female    DOB: [birth year]  Age: 49 y.o. MRN: [id number]  Chief Complaint  Patient presents with  . Diabetes  . Hyperlipidemia  . Hypertension  . Hypothyroidism    HPI Patient presents for follow up of diabetes, hypothyroidism, and hyperlipidemia.   Diabetes: Sugars 160-170. Has not been eating out a lot since getting moved in. Currently taking synjardy, lantus, and trulicity. Patient checks her feet daily.   Hyperlipidemia: Pravastatin for cholesterol.   For hypothyroidism currently taking synthroid 75 mcg daily   Hypertension is well controlled on losartan 50 mg once daily.  Chronic pain syndrome with opioid dependence due to low back pain which is managed with gabapentin, robaxin, and morphine ER 30 mg one twice a day.   Depression fairly well controlled on wellbutrin xl and fetzima. Ambien for insomnia. Stress has decreased as she is finally moved into her new home.    Social History   Socioeconomic History  . Marital status: Legally Separated    Spouse name: Not on file  . Number of children: Not on file  . Years of education: Not on file  . Highest education level: Not on file  Occupational History  . Occupation: disabled  Tobacco Use  . Smoking status: Never Smoker  . Smokeless tobacco: Never Used  Substance and Sexual Activity  . Alcohol use: No  . Drug use: No  . Sexual activity: Yes  Other Topics Concern  . Not on file  Social History Narrative   wears sunscreen, brushes and flosses daily, see's dentist bi-annually, has smoke/carbon monoxide detectors, wears a seatbelt and practices gun safety   Social Determinants of Health   Financial Resource Strain:   . Difficulty of Paying Living Expenses:   Food Insecurity: No Food Insecurity  . Worried About Charity fundraiser in the Last Year: Never true  . Ran Out of Food in the Last Year: Never true  Transportation Needs:   . Lack of Transportation (Medical):    Marland Kitchen Lack of Transportation (Non-Medical):   Physical Activity:   . Days of Exercise per Week:   . Minutes of Exercise per Session:   Stress:   . Feeling of Stress :   Social Connections:   . Frequency of Communication with Friends and Family:   . Frequency of Social Gatherings with Friends and Family:   . Attends Religious Services:   . Active Member of Clubs or Organizations:   . Attends Archivist Meetings:   Marland Kitchen Marital Status:    Past Medical History:  Diagnosis Date  . Anemia   . Anxiety   . Arthritis   . BRCA gene mutation positive   . Chronic pain syndrome   . Depression   . Diabetes mellitus without complication ([place])    type 2  . Gastritis   . GERD (gastroesophageal reflux disease)   . History of kidney stones   . Hypertension   . Hypothyroidism   . Mixed hyperlipidemia   . Other primary thrombocytopenia ([place])   . Sleep apnea    hx of . No longer has   Family History  Problem Relation Age of Onset  . Cancer Mother        breast  . CAD Father   . Diabetes Father   . Heart failure Father   . Cancer Father        renal carcinoma  . Kidney failure Father   . Stroke Paternal  Grandmother     Review of Systems  Constitutional: Negative for chills, fatigue and fever.       100.5 last week.   HENT: Negative for congestion, ear pain and sore throat.   Respiratory: Negative for cough and shortness of breath.   Cardiovascular: Negative for chest pain.  Gastrointestinal: Positive for diarrhea and nausea. Negative for abdominal pain, constipation and vomiting.       See HPI.  Endocrine: Negative for polydipsia, polyphagia and polyuria.  Genitourinary: Negative for dysuria.  Musculoskeletal: Positive for arthralgias (Right wrist pain) and back pain.       See HPI.   Neurological: Negative for headaches.       Memory issues  Psychiatric/Behavioral: Negative for dysphoric mood (well controlled with medications). The patient is not nervous/anxious.         See HPI.     Objective:  BP 122/82   Pulse 87   Temp (!) 96.5 F (35.8 C)   Ht 5' 2" (1.575 m)   Wt 207 lb (93.9 kg)   LMP 06/13/2009   SpO2 97%   BMI 37.86 kg/m   BP/Weight 07/08/2019 04/03/2019 06/08/2018  Systolic BP 122 116 134  Diastolic BP 82 64 62  Wt. (Lbs) 207 203 219  BMI 37.86 35.96 40.06    Physical Exam Vitals reviewed.  Constitutional:      General: She is not in acute distress.    Appearance: Normal appearance. She is obese.  Neck:     Thyroid: No thyroid mass.  Cardiovascular:     Rate and Rhythm: Normal rate and regular rhythm.     Pulses: Normal pulses.     Heart sounds: No murmur heard.   Pulmonary:     Effort: Pulmonary effort is normal.     Breath sounds: Normal breath sounds.  Abdominal:     General: Bowel sounds are normal.     Palpations: Abdomen is soft. There is no mass.     Tenderness: There is no abdominal tenderness.     Comments: Mild diffuse  Musculoskeletal:        General: Normal range of motion.  Lymphadenopathy:     Cervical: No cervical adenopathy.  Skin:    General: Skin is warm and dry.  Neurological:     Mental Status: She is alert and oriented to person, place, and time.     Cranial Nerves: No cranial nerve deficit.  Psychiatric:        Mood and Affect: Mood normal.        Behavior: Behavior normal.   MMSE 30/30.  Diabetic Foot Exam - Simple   Simple Foot Form Diabetic Foot exam was performed with the following findings: Yes   Visual Inspection See comments: Yes Sensation Testing Intact to touch and monofilament testing bilaterally: Yes Pulse Check Posterior Tibialis and Dorsalis pulse intact bilaterally: Yes Comments Long thickened toenails     Lab Results  Component Value Date   WBC 8.7 04/03/2019   HGB 13.4 04/03/2019   HCT 41.5 04/03/2019   PLT 154 04/03/2019   GLUCOSE 110 (H) 04/03/2019   CHOL 158 04/03/2019   TRIG 95 04/03/2019   HDL 59 04/03/2019   LDLCALC 82 04/03/2019   ALT 12 04/03/2019     AST 17 04/03/2019   NA 138 04/03/2019   K 3.5 04/03/2019   CL 98 04/03/2019   CREATININE 1.05 (H) 04/03/2019   BUN 10 04/03/2019   CO2 24 04/03/2019   TSH 2.070 04/03/2019     HGBA1C 5.6 04/03/2019   MICROALBUR 80 07/08/2019      Assessment & Plan:    1. Mixed hyperlipidemia Well controlled.  No changes to medicines.  Continue to work on eating a healthy diet and exercise.  Labs drawn today. - Lipid panel  2. Dyslipidemia associated with type 2 diabetes mellitus ([place]) Well controlled.  No changes to medicines. Consider decreasing medicines in future as a1c has been excellent. Continue to work on eating a healthy diet and exercise.  Labs drawn today.  - Hemoglobin A1c - POCT UA - Microalbumin  3. Essential hypertension, benign Well controlled. The current medical regimen is effective;  continue present plan and medications. - CBC with Differential/Platelet - Comprehensive metabolic panel  4. Major depressive disorder, recurrent, mild (HCC) The current medical regimen is effective;  continue present plan and medications.  5. Acquired hypothyroidism The current medical regimen is effective;  continue present plan and medications. - TSH  7. Morbid obesity with BMI 35 and comorbidities (all) as listed above. Recommend work on weight loss.   Recommend continue to work on eating healthy diet and exercise.  6. Memory loss Check labs. Consider mri of brain. - B12 and Folate Panel  7. Chronic pain syndrome The current medical regimen is effective;  continue present plan and medications.  8. Uncomplicated opioid dependence ([facility]) Necessary for control of her pan.   Follow-up: Return in about 3 months (around 10/08/2019) for fasting.     AVS was given to patient prior to departure.  [name] [facility] [phone]

## 2019-07-09 LAB — LIPID PANEL
Chol/HDL Ratio: 3.3 ratio (ref 0.0–4.4)
Cholesterol, Total: 180 mg/dL (ref 100–199)
HDL: 54 mg/dL (ref >39)
LDL Chol Calc (NIH): 100 mg/dL — ABNORMAL HIGH (ref 0–99)
Triglycerides: 152 mg/dL — ABNORMAL HIGH (ref 0–149)
VLDL Cholesterol Cal: 26 mg/dL (ref 5–40)

## 2019-07-09 LAB — COMPREHENSIVE METABOLIC PANEL
ALT: 16 IU/L (ref 0–32)
AST: 27 IU/L (ref 0–40)
Albumin/Globulin Ratio: 1.5 (ref 1.2–2.2)
Albumin: 4.4 g/dL (ref 3.8–4.8)
Alkaline Phosphatase: 125 IU/L — ABNORMAL HIGH (ref 48–121)
BUN/Creatinine Ratio: 12 (ref 9–23)
BUN: 13 mg/dL (ref 6–24)
Bilirubin Total: 0.4 mg/dL (ref 0.0–1.2)
CO2: 22 mmol/L (ref 20–29)
Calcium: 9.3 mg/dL (ref 8.7–10.2)
Chloride: 101 mmol/L (ref 96–106)
Creatinine, Ser: 1.11 mg/dL — ABNORMAL HIGH (ref 0.57–1.00)
GFR calc Af Amer: 67 mL/min/{1.73_m2} (ref >59)
GFR calc non Af Amer: 58 mL/min/{1.73_m2} — ABNORMAL LOW (ref >59)
Globulin, Total: 3 g/dL (ref 1.5–4.5)
Glucose: 116 mg/dL — ABNORMAL HIGH (ref 65–99)
Potassium: 4.4 mmol/L (ref 3.5–5.2)
Sodium: 143 mmol/L (ref 134–144)
Total Protein: 7.4 g/dL (ref 6.0–8.5)

## 2019-07-09 LAB — CBC WITH DIFFERENTIAL/PLATELET
Basophils Absolute: 0 10*3/uL (ref 0.0–0.2)
Basos: 1 %
EOS (ABSOLUTE): 0.1 10*3/uL (ref 0.0–0.4)
Eos: 1 %
Hematocrit: 43.4 % (ref 34.0–46.6)
Hemoglobin: 13.2 g/dL (ref 11.1–15.9)
Immature Grans (Abs): 0 10*3/uL (ref 0.0–0.1)
Immature Granulocytes: 0 %
Lymphocytes Absolute: 1.9 10*3/uL (ref 0.7–3.1)
Lymphs: 29 %
MCH: 26.7 pg (ref 26.6–33.0)
MCHC: 30.4 g/dL — ABNORMAL LOW (ref 31.5–35.7)
MCV: 88 fL (ref 79–97)
Monocytes Absolute: 0.3 10*3/uL (ref 0.1–0.9)
Monocytes: 5 %
Neutrophils Absolute: 4.2 10*3/uL (ref 1.4–7.0)
Neutrophils: 64 %
Platelets: 118 10*3/uL — ABNORMAL LOW (ref 150–450)
RBC: 4.95 x10E6/uL (ref 3.77–5.28)
RDW: 13.2 % (ref 11.7–15.4)
WBC: 6.6 10*3/uL (ref 3.4–10.8)

## 2019-07-09 LAB — TSH: TSH: 1.45 u[IU]/mL (ref 0.450–4.500)

## 2019-07-09 LAB — HEMOGLOBIN A1C
Est. average glucose Bld gHb Est-mCnc: 131 mg/dL
Hgb A1c MFr Bld: 6.2 % — ABNORMAL HIGH (ref 4.8–5.6)

## 2019-07-09 LAB — CARDIOVASCULAR RISK ASSESSMENT

## 2019-07-09 LAB — B12 AND FOLATE PANEL
Folate: 4.7 ng/mL (ref >3.0)
Vitamin B-12: 303 pg/mL (ref 232–1245)

## 2019-07-10 DIAGNOSIS — Z23 Encounter for immunization: Secondary | ICD-10-CM | POA: Diagnosis not present

## 2019-07-10 DIAGNOSIS — D649 Anemia, unspecified: Secondary | ICD-10-CM | POA: Diagnosis not present

## 2019-07-24 ENCOUNTER — Other Ambulatory Visit: Payer: Self-pay | Admitting: Physician Assistant

## 2019-07-24 DIAGNOSIS — Z1501 Genetic susceptibility to malignant neoplasm of breast: Secondary | ICD-10-CM

## 2019-07-24 DIAGNOSIS — Z1509 Genetic susceptibility to other malignant neoplasm: Secondary | ICD-10-CM

## 2019-07-24 MED ORDER — ZOLPIDEM TARTRATE 10 MG PO TABS: 10.0000 mg | ORAL_TABLET | Freq: Every evening | ORAL | 0 refills | Status: DC | PRN

## 2019-07-24 MED ORDER — MORPHINE SULFATE ER 30 MG PO TBCR: 30.0000 mg | EXTENDED_RELEASE_TABLET | Freq: Two times a day (BID) | ORAL | 0 refills | Status: DC

## 2019-07-25 ENCOUNTER — Ambulatory Visit: Payer: PPO

## 2019-07-25 DIAGNOSIS — E1169 Type 2 diabetes mellitus with other specified complication: Secondary | ICD-10-CM

## 2019-07-25 DIAGNOSIS — I10 Essential (primary) hypertension: Secondary | ICD-10-CM

## 2019-07-25 NOTE — Patient Instructions (Signed)
Visit Information  Goals Addressed            This Visit's Progress   . Pharmacy Care Plan       CARE PLAN ENTRY  Current Barriers:  . Chronic Disease Management support, education, and care coordination needs related to Hypertension, Hyperlipidemia, and Diabetes   Hypertension . Pharmacist Clinical Goal(s): o Over the next 90 days, patient will work with PharmD and providers to maintain BP goal <130/80 . Current regimen:  o Losartan 50 mg daily . Interventions: o Recommend patient work on resuming healthier eating as she gets settled in her new home/routine.   o Recommend patient begin pharmacy delivery to ensure optimal adherence.  . Patient self care activities - Over the next 90 days, patient will: o Check BP monthly, document, and provide at future appointments o Ensure daily salt intake < 2300 mg/day  Hyperlipidemia . Pharmacist Clinical Goal(s): o Over the next 90 days, patient will work with PharmD and providers to maintain LDL goal < 100 . Current regimen:  o Pravastatin 20 mg daily in the evening.  . Interventions: o Recommend resuming healthier eating daily as she gets settled.  . Patient self care activities - Over the next 90 days, patient will: o Work toward goal of attending water aerobics at the [employer] via [employer].   Diabetes . Pharmacist Clinical Goal(s): o Over the next 90 days, patient will work with PharmD and providers to maintain A1c goal <7% . Current regimen:  o Trulicity 6.41 mg weekly. Synjardy 05-998 mg daily, Lantus.  . Interventions: o Recommend continuing to setan alarm to remind for weekly dose of Trulicity.  o Recommend finding a reminder for Lantus dosing to avoid forgetting.  . Patient self care activities - Over the next 90 days, patient will: o Check blood sugar twice daily, document, and provide at future appointments o Contact provider with any episodes of hypoglycemia  Medication management . Pharmacist Clinical  Goal(s): o Over the next 90  days, patient will work with PharmD and providers to achieve optimal medication adherence . Current pharmacy: [facility] . Interventions o Comprehensive medication review performed. o Utilize [facility] pharmacy for medication synchronization, packaging and delivery . Patient self care activities - Over the next 90 days, patient will: o Focus on medication adherence by utilizing pharmacy delivery o Take medications as prescribed o Report any questions or concerns to PharmD and/or provider(s)  Please see past updates related to this goal by clicking on the "Past Updates" button in the selected goal         The patient verbalized understanding of instructions provided today and declined a print copy of patient instruction materials.   Telephone follow up appointment with pharmacy team member scheduled for: 09/2019  Sherre Poot, PharmD, [facility] Clinical Pharmacist [name] [phone] (office) [phone] (mobile)

## 2019-07-25 NOTE — Chronic Care Management (AMB) (Signed)
Chronic Care Management Pharmacy  Name: Linda [name]  MRN: [id number] DOB: April 02, 1970  Chief Complaint/ HPI  Linda [name],  49 y.o. , female presents for their Follow-Up CCM visit with the clinical pharmacist via telephone due to COVID-19 Pandemic.  PCP : [name], MD  Their chronic conditions include: HTN, Dyslipidemia, Hypothyroidism, BRCA 1 positive, HLD, Depression, Vitamin D insufficiency, GERD, Gastritis, Anxiety, DM.  Office Visits: 07/08/2019 - labs checked. No med changes at this time.  04/03/2019 - no med changes.  02/05/2019 - follow-up after ED for stomach pain 01/01/2020 - Well controlled - no med changes.  Consult Visit: 05/10/2019 - GI - increased diarrhea. Stool studies ordered, equilactin and loperamide recommended. Increase pantoprazole to bid.  04/22/2019 - Oncology - hep a,b and c negative. Routine monitoring of breast since not undergoing double mastectomy.  03/29/2019 - NASH - recommended hep a and b vaccine  03/04/2019 - podiatry for callus of toe and nail care.  01/29/2019 -ED visit for stomach pain. 12/17/2019 - no retinopathy on eye exam  Medications: Outpatient Encounter Medications as of 07/25/2019  Medication Sig Note  . Blood Glucose Monitoring Suppl (ONETOUCH VERIO REFLECT) w/Device KIT AS DIRECTED   . Blood Glucose Monitoring Suppl (ONETOUCH VERIO REFLECT) w/Device KIT See admin instructions.   Marland Kitchen buPROPion (WELLBUTRIN XL) 300 MG 24 hr tablet Take 1 tablet (300 mg total) by mouth every evening.   . cetirizine (ZYRTEC) 10 MG tablet Take 10 mg by mouth daily as needed (seasonal allergies).    . clotrimazole-betamethasone (LOTRISONE) cream Apply small amount to affected area twice daily   . dicyclomine (BENTYL) 20 MG tablet TAKE ONE TABLET BY MOUTH BEFORE meals AND AT bedtime as needed FOR stomach cramping.   . Dulaglutide (TRULICITY) 4.49 QP/5.9FM SOPN INJECT ONE syringe ONCE WEEKLY   . Empagliflozin-metFORMIN HCl (SYNJARDY)  05-998 MG TABS Take 1 tablet by mouth in the morning. Synjardy   . famotidine (PEPCID) 20 MG tablet Take 1 tablet (20 mg total) by mouth 2 (two) times daily.   . ferrous sulfate 325 (65 FE) MG tablet Take 325 mg by mouth every evening.   . gabapentin (NEURONTIN) 300 MG capsule Take 1 capsule (300 mg total) by mouth 2 (two) times daily.   Marland Kitchen glucose blood test strip 1 each by Other route in the morning and at bedtime. One Touch Verio - Patient checks blood sugar 2-3 times daily.   . insulin glargine (LANTUS) 100 unit/mL SOPN Inject 0.68 mLs (68 Units total) into the skin at bedtime.   . Insulin Pen Needle (BD PEN NEEDLE NANO U/F) 32G X 4 MM MISC    . Levomilnacipran HCl ER (FETZIMA) 80 MG CP24 Take 1 capsule by mouth daily.   Marland Kitchen levothyroxine (SYNTHROID) 75 MCG tablet Take 1 tablet (75 mcg total) by mouth daily.   Marland Kitchen losartan (COZAAR) 50 MG tablet Take 1 tablet (50 mg total) by mouth every evening.   . methocarbamol (ROBAXIN) 500 MG tablet Take 1 tablet (500 mg total) by mouth 2 (two) times daily.   Marland Kitchen morphine (MS CONTIN) 30 MG 12 hr tablet Take 1 tablet (30 mg total) by mouth every 12 (twelve) hours.   . ondansetron (ZOFRAN) 4 MG tablet Take 1 tablet (4 mg total) by mouth 4 (four) times daily as needed for nausea.   Glory Rosebush Delica Lancets 38G MISC 1 each by Does not apply route daily before breakfast. Check blood sugar twice daily.   . pantoprazole (PROTONIX) 40  MG tablet Take 1 tablet (40 mg total) by mouth 2 (two) times daily.   . potassium chloride (MICRO-K) 10 MEQ CR capsule Take 2 capsules (20 mEq total) by mouth 2 (two) times daily.   . pravastatin (PRAVACHOL) 20 MG tablet Take 1 tablet (20 mg total) by mouth at bedtime.   . raloxifene (EVISTA) 60 MG tablet Take 1 tablet (60 mg total) by mouth daily.   Marland Kitchen Specialty Vitamins Products (MAGNESIUM, AMINO ACID CHELATE,) 133 MG tablet Take 1 tablet by mouth at bedtime.   . sucralfate (CARAFATE) 1 g tablet Take 1 tablet (1 g total) by mouth 4 (four)  times daily -  with meals and at bedtime.   . Vitamin D, Ergocalciferol, (DRISDOL) 1.25 MG (50000 UNIT) CAPS capsule Take 1 capsule (50,000 Units total) by mouth every Friday. Friday nights   . zolpidem (AMBIEN) 10 MG tablet Take 1 tablet (10 mg total) by mouth at bedtime as needed.   . Multiple Vitamin (MULTIVITAMIN WITH MINERALS) TABS tablet Take 1 tablet by mouth daily. (Patient not taking: Reported on 07/25/2019) 06/08/2018: Pt plans on purchasing again when she can get out.   No facility-administered encounter medications on file as of 07/25/2019.   Allergies  Allergen Reactions  . Codeine Shortness Of Breath  . Celecoxib Other (See Comments)    Unknown reaction  . Ezetimibe-Simvastatin Other (See Comments)    Unknown reaction  . Propranolol Hcl Other (See Comments)    Unknown reaction   SDOH Screenings   Alcohol Screen:   . Last Alcohol Screening Score (AUDIT):   Depression (PHQ2-9): Medium Risk  . PHQ-2 Score: 11  Financial Resource Strain:   . Difficulty of Paying Living Expenses:   Food Insecurity: No Food Insecurity  . Worried About Charity fundraiser in the Last Year: Never true  . Ran Out of Food in the Last Year: Never true  Housing: Low Risk   . Last Housing Risk Score: 0  Physical Activity:   . Days of Exercise per Week:   . Minutes of Exercise per Session:   Social Connections:   . Frequency of Communication with Friends and Family:   . Frequency of Social Gatherings with Friends and Family:   . Attends Religious Services:   . Active Member of Clubs or Organizations:   . Attends Archivist Meetings:   Marland Kitchen Marital Status:   Stress:   . Feeling of Stress :   Tobacco Use: Low Risk   . Smoking Tobacco Use: Never Smoker  . Smokeless Tobacco Use: Never Used  Transportation Needs:   . Film/video editor (Medical):   Marland Kitchen Lack of Transportation (Non-Medical):    Current Diagnosis/Assessment:  Goals Addressed            This Visit's Progress   .  Pharmacy Care Plan       CARE PLAN ENTRY  Current Barriers:  . Chronic Disease Management support, education, and care coordination needs related to Hypertension, Hyperlipidemia, and Diabetes   Hypertension . Pharmacist Clinical Goal(s): o Over the next 90 days, patient will work with PharmD and providers to maintain BP goal <130/80 . Current regimen:  o Losartan 50 mg daily . Interventions: o Recommend patient work on resuming healthier eating as she gets settled in her new home/routine.   o Recommend patient begin pharmacy delivery to ensure optimal adherence.  . Patient self care activities - Over the next 90 days, patient will: o Check BP monthly, document, and  provide at future appointments o Ensure daily salt intake < 2300 mg/day  Hyperlipidemia . Pharmacist Clinical Goal(s): o Over the next 90 days, patient will work with PharmD and providers to maintain LDL goal < 100 . Current regimen:  o Pravastatin 20 mg daily in the evening.  . Interventions: o Recommend resuming healthier eating daily as she gets settled.  . Patient self care activities - Over the next 90 days, patient will: o Work toward goal of attending water aerobics at the [employer] via [employer].   Diabetes . Pharmacist Clinical Goal(s): o Over the next 90 days, patient will work with PharmD and providers to maintain A1c goal <7% . Current regimen:  o Trulicity 5.03 mg weekly. Synjardy 05-998 mg daily, Lantus.  . Interventions: o Recommend continuing to setan alarm to remind for weekly dose of Trulicity.  o Recommend finding a reminder for Lantus dosing to avoid forgetting.  . Patient self care activities - Over the next 90 days, patient will: o Check blood sugar twice daily, document, and provide at future appointments o Contact provider with any episodes of hypoglycemia  Medication management . Pharmacist Clinical Goal(s): o Over the next 90  days, patient will work with PharmD and providers to achieve  optimal medication adherence . Current pharmacy: [facility] . Interventions o Comprehensive medication review performed. o Utilize [facility] for medication synchronization, packaging and delivery . Patient self care activities - Over the next 90 days, patient will: o Focus on medication adherence by utilizing pharmacy delivery o Take medications as prescribed o Report any questions or concerns to PharmD and/or provider(s)  Please see past updates related to this goal by clicking on the "Past Updates" button in the selected goal        Hyperlipidemia   Lipid Panel     Component Value Date/Time   CHOL 180 07/08/2019 0000   TRIG 152 (H) 07/08/2019 0000   HDL 54 07/08/2019 0000   LDLCALC 100 (H) 07/08/2019 0000     The 10-year ASCVD risk score Mikey Bussing DC Jr., et al., 2013) is: 2.6%   Values used to calculate the score:     Age: 26 years     Sex: Female     Is Non-Hispanic African American: No     Diabetic: Yes     Tobacco smoker: No     Systolic Blood Pressure: 888 mmHg     Is BP treated: Yes     HDL Cholesterol: 54 mg/dL     Total Cholesterol: 180 mg/dL   Patient has failed these meds in past: n/a Patient is currently controlled on the following medications:  . Pravastatin 20 mg daily qhs  We discussed:  diet and exercise extensively. Patient has moved recently and still settling in so has been eating much more fast food lately. She would like to begin getting back to exercise once settled. Currently she is getting a lot of activity moving and unpacking boxes.   Update 07/25/2019 - Patient is still working to get out building cleaned out. Continue taking cholesterol meds as prescribed.   Plan  Continue current medications   Diabetes   Recent Relevant Labs: Lab Results  Component Value Date/Time   HGBA1C 6.2 (H) 07/08/2019 12:00 AM   HGBA1C 5.6 04/03/2019 11:36 AM   MICROALBUR 80 07/08/2019 09:51 AM    Checking BG: Daily  Recent FBG Readings:  has not been checking.  Recent HS BG readings: 156 Patient has failed these meds in past:  metformin Patient is currently controlled on the following medications: synjardy 05-998 mg daily, lantus 68 units qhs, trulicity 6.19 mg weekly  Last diabetic Foot exam: March 2021 Last diabetic Eye exam: 12/17/2018   We discussed: diet and exercise extensively Was having bad diarrhea when taking Synjardy once daily due to diarrhea. She has been self adjusting lantus based on blood sugar readings gives 0 units if blood sugar is 100-120 mg/dL may give 20-40 units if blood sugar in the 150s in the evening. She forgets Trulicity doses on Saturdays. We discussed setting an alarm on her phone each week to remind her. She is going to begin writing down how many units of Lantus she uses each day and correlating blood sugars. She will bring this to her appointment with Dr. Tobie Poet to determine a more stable/consistent Lantus dose.   Update 07/25/2019 - Patient reports that alarm is helping keep her Trulicity dosing consistent. She forgot Lantus one day recently. Working to reduce Lantus in future dosing. Last a1c is in goal.   Plan  Continue current medications,   Hypothyroidism   Lab Results  Component Value Date/Time   TSH 1.450 07/08/2019 12:00 AM   TSH 2.070 04/03/2019 11:36 AM    Patient has failed these meds in past: n/a Patient is currently controlled on the following medications:  . Levothyroxine 75 mcg daily   We discussed: Patient takes levothyroxine each morning. She denies missed doses.   Update 07/25/2019 - Patient reports good adherence with therapy.   Plan  Continue current medications   Hypertension   BP today is:  <140/90  Office blood pressures are  BP Readings from Last 3 Encounters:  07/08/19 122/82  04/03/19 116/64  06/08/18 134/62    Patient has failed these meds in the past: lisinopril 10 mg daily Patient is currently controlled on the following medications: losartan  50 mg daily Patient checks BP at home several times per month  Patient home BP readings are ranging: 155/95 mmHg  We discussed diet and exercise extensively. Blood pressure was elevated last week when sugar went up one day, increased pain and patient had a headache.   Update 07/25/2019 - BP is at goal. Good compliance with treatment.   Plan  Continue current medications     and  Other Diagnosis: Chronic Pain Syndrome   Patient has failed these meds in past: naproxen,  Patient is currently controlled on the following medications: morphine er 30 mg bid, gabapentin 300 mg tid  We discussed: Patient struggles with pain and neuropathy. She has tried cutting back in the past to once daily but pain increased. She is managing on twice daily right now. She has occasional days that she requires taking 3 times daily. Patient is very concerned with her memory being affected by gabapentin and morphine. Patient rates pain as a 5/10 on a day to day basis. She can tell a big difference when she does a lot of physical labor or housework with her back pain. Water aerobics in the past has alleviated her back pain. She is not currently doing water aerobics. We discussed silver sneakers and the [employer] once she gets settled in her apartment.   Update 07/25/2019 - Patient has good compliance with regimen. Continue pharmacy delivery to improve access to medication.   Plan  Continue current medications  Depression   Patient has failed these meds in past: n/a Patient is currently controlled on the following medications:  . Bupropion xl 300 mg daily . Fetzima 80  mg daily   We discussed:  Has had increased stress and anxiety with her recent move. She is feeling better since getting settled in her new apartment.   Update 07/25/2019 - Patient reports good compliance with medication.   Plan  Continue current medications  GERD/Gastritis   Patient has failed these meds in past: n/a Patient is currently  controlled on the following medications:  . Dicyclomine 20 mg qid prn for spasms . Famotidine 20 mg bid . Zofran 4 mg qid prn - nausea . Pantoprazole 40 mg bid  . Sucralfate 1 gm qid  We discussed: Patient sees Dr. Melina Copa for stomach spasms/issues. She is seeing him later this month to determine if she has IBS. It seems that the flares and poor diet correlate with episodes of stress. Dr. Melina Copa told patient that she could stop famotidine if she would like since on pantoprazole. Patient would like to trial a few days stopping famotidine. Patient never struggles with constipation.   07/25/2019 - Patient has had some nausea and spells not feeling as well. She had to take dicyclomine and ondansetron for the spell yesterday.   Plan  Continue current medications  Health Maintenance   Patient is currently controlled on the following medications:  Marland Kitchen Zolpidem 10 mg daily - insomnia . Magnesium daily at bedtime- insomnia . Evista 60 mg daily - breast cancer prevention  . Methocarbamol 500 mg bid -  Back spasms . Multivitamin daily - general health . Potassium chloride 10 meq bid - Low potassium . Vitamin D 50,000 IU weekly - Low vitamin D . Cetrizine 10 mg daily prn - seasonal allergies . Ferrous sulfate 325 mg daily  . Clotrimazole-betamethasone bid - rash under breast   We discussed:  Recommend patient begin eating with ferrous sulfate.   Plan  Continue current medications  Vaccines   Reviewed and discussed patient's vaccination history. Has had both [place].   Immunization History  Administered Date(s) Administered  . Influenza-Unspecified 09/14/2018  . PFIZER SARS-COV-2 Vaccination 04/18/2019, 05/13/2019    Plan  Recommended patient receive annual flu vaccine in office.   Medication Management   Pt uses [facility] for all medications Uses pill box? Yes Pt endorses decent compliance  We discussed: Verbal consent obtained for [facility]  enhanced pharmacy services (medication synchronization, adherence packaging, delivery coordination). A medication sync plan was created to allow patient to get all medications delivered once every 30 to 90 days per patient preference. Patient understands they have freedom to choose pharmacy and clinical pharmacist will coordinate care between all prescribers and [facility].   07/25/2019 - Patient's medications will be delivered 08/01/2019. Patient approves needed medications. Refills of zolpidem and morphine submitted by [facility] due to Dr. Alyse Low being out.   Plan  Utilize [facility] for medication synchronization, packaging and delivery    Follow up: 1 month phone visit

## 2019-07-31 ENCOUNTER — Other Ambulatory Visit: Payer: Self-pay

## 2019-07-31 DIAGNOSIS — I10 Essential (primary) hypertension: Secondary | ICD-10-CM

## 2019-07-31 DIAGNOSIS — E782 Mixed hyperlipidemia: Secondary | ICD-10-CM

## 2019-07-31 DIAGNOSIS — E039 Hypothyroidism, unspecified: Secondary | ICD-10-CM

## 2019-07-31 NOTE — Progress Notes (Signed)
Patient had an appt 06/26/2019 with Sherre Poot, PharmD.

## 2019-08-01 ENCOUNTER — Telehealth: Payer: PPO

## 2019-08-05 ENCOUNTER — Other Ambulatory Visit: Payer: Self-pay

## 2019-08-05 MED ORDER — SYNJARDY 12.5-1000 MG PO TABS
1.0000 | ORAL_TABLET | Freq: Every day | ORAL | 0 refills | Status: DC
Start: 2019-08-05 — End: 2019-10-29

## 2019-08-14 DIAGNOSIS — Z23 Encounter for immunization: Secondary | ICD-10-CM | POA: Diagnosis not present

## 2019-08-26 ENCOUNTER — Telehealth: Payer: Self-pay

## 2019-08-26 ENCOUNTER — Other Ambulatory Visit: Payer: Self-pay

## 2019-08-26 DIAGNOSIS — Z1501 Genetic susceptibility to malignant neoplasm of breast: Secondary | ICD-10-CM

## 2019-08-26 DIAGNOSIS — Z1509 Genetic susceptibility to other malignant neoplasm: Secondary | ICD-10-CM

## 2019-08-26 MED ORDER — ZOLPIDEM TARTRATE 10 MG PO TABS: 10.0000 mg | ORAL_TABLET | Freq: Every evening | ORAL | 0 refills | Status: DC | PRN

## 2019-08-26 MED ORDER — MORPHINE SULFATE ER 30 MG PO TBCR: 30.0000 mg | EXTENDED_RELEASE_TABLET | Freq: Two times a day (BID) | ORAL | 0 refills | Status: DC

## 2019-08-26 NOTE — Progress Notes (Signed)
Chronic Care Management Pharmacy Assistant   Name: Linda [name]  MRN: [id number] DOB: Apr 23, 1970  Reason for Encounter: Medication Review  Patient Questions:  1.  Have you seen any other providers since your last visit? No  2.  Any changes in your medicines or health? No    PCP : [name], MD  Allergies:   Allergies  Allergen Reactions  . Codeine Shortness Of Breath  . Celecoxib Other (See Comments)    Unknown reaction  . Ezetimibe-Simvastatin Other (See Comments)    Unknown reaction  . Propranolol Hcl Other (See Comments)    Unknown reaction    Medications: Outpatient Encounter Medications as of 08/26/2019  Medication Sig Note  . Blood Glucose Monitoring Suppl (ONETOUCH VERIO REFLECT) w/Device KIT AS DIRECTED   . Blood Glucose Monitoring Suppl (ONETOUCH VERIO REFLECT) w/Device KIT See admin instructions.   Marland Kitchen buPROPion (WELLBUTRIN XL) 300 MG 24 hr tablet Take 1 tablet (300 mg total) by mouth every evening.   . cetirizine (ZYRTEC) 10 MG tablet Take 10 mg by mouth daily as needed (seasonal allergies).    . clotrimazole-betamethasone (LOTRISONE) cream Apply small amount to affected area twice daily   . dicyclomine (BENTYL) 20 MG tablet TAKE ONE TABLET BY MOUTH BEFORE meals AND AT bedtime as needed FOR stomach cramping.   . Dulaglutide (TRULICITY) 3.23 FT/7.3UK SOPN INJECT ONE syringe ONCE WEEKLY   . Empagliflozin-metFORMIN HCl (SYNJARDY) 12.05-998 MG TABS Take 1 tablet by mouth daily.   . famotidine (PEPCID) 20 MG tablet Take 1 tablet (20 mg total) by mouth 2 (two) times daily.   . ferrous sulfate 325 (65 FE) MG tablet Take 325 mg by mouth every evening.   . gabapentin (NEURONTIN) 300 MG capsule Take 1 capsule (300 mg total) by mouth 2 (two) times daily.   Marland Kitchen glucose blood test strip 1 each by Other route in the morning and at bedtime. One Touch Verio - Patient checks blood sugar 2-3 times daily.   . insulin glargine (LANTUS) 100 unit/mL SOPN Inject 0.68 mLs (68  Units total) into the skin at bedtime.   . Insulin Pen Needle (BD PEN NEEDLE NANO U/F) 32G X 4 MM MISC    . Levomilnacipran HCl ER (FETZIMA) 80 MG CP24 Take 1 capsule by mouth daily.   Marland Kitchen levothyroxine (SYNTHROID) 75 MCG tablet Take 1 tablet (75 mcg total) by mouth daily.   Marland Kitchen losartan (COZAAR) 50 MG tablet Take 1 tablet (50 mg total) by mouth every evening.   . methocarbamol (ROBAXIN) 500 MG tablet Take 1 tablet (500 mg total) by mouth 2 (two) times daily.   Marland Kitchen morphine (MS CONTIN) 30 MG 12 hr tablet Take 1 tablet (30 mg total) by mouth every 12 (twelve) hours.   . Multiple Vitamin (MULTIVITAMIN WITH MINERALS) TABS tablet Take 1 tablet by mouth daily. (Patient not taking: Reported on 07/25/2019) 06/08/2018: Pt plans on purchasing again when she can get out.  . ondansetron (ZOFRAN) 4 MG tablet Take 1 tablet (4 mg total) by mouth 4 (four) times daily as needed for nausea.   Glory Rosebush Delica Lancets 02R MISC 1 each by Does not apply route daily before breakfast. Check blood sugar twice daily.   . pantoprazole (PROTONIX) 40 MG tablet Take 1 tablet (40 mg total) by mouth 2 (two) times daily.   . potassium chloride (MICRO-K) 10 MEQ CR capsule Take 2 capsules (20 mEq total) by mouth 2 (two) times daily.   . pravastatin (PRAVACHOL) 20  MG tablet Take 1 tablet (20 mg total) by mouth at bedtime.   . raloxifene (EVISTA) 60 MG tablet Take 1 tablet (60 mg total) by mouth daily.   Marland Kitchen Specialty Vitamins Products (MAGNESIUM, AMINO ACID CHELATE,) 133 MG tablet Take 1 tablet by mouth at bedtime.   . sucralfate (CARAFATE) 1 g tablet Take 1 tablet (1 g total) by mouth 4 (four) times daily -  with meals and at bedtime.   . Vitamin D, Ergocalciferol, (DRISDOL) 1.25 MG (50000 UNIT) CAPS capsule Take 1 capsule (50,000 Units total) by mouth every Friday. Friday nights   . zolpidem (AMBIEN) 10 MG tablet Take 1 tablet (10 mg total) by mouth at bedtime as needed.    No facility-administered encounter medications on file as of  08/26/2019.    Current Diagnosis: Patient Active Problem List   Diagnosis Date Noted  . Chronic pain syndrome 07/08/2019  . Memory loss 07/08/2019  . Depression, major, recurrent, mild ([place]) 07/08/2019  . Uncomplicated opioid dependence ([place]) 07/08/2019  . Mixed hyperlipidemia 04/11/2019  . Dyslipidemia associated with type 2 diabetes mellitus ([place]) 04/11/2019  . Essential hypertension, benign 04/11/2019  . Major depressive disorder, single episode, mild ([place]) 04/11/2019  . Acquired hypothyroidism 04/11/2019  . Vitamin D insufficiency 04/11/2019  . Class 2 severe obesity due to excess calories with serious comorbidity and body mass index (BMI) of 35.0 to 35.9 in adult ([place]) 04/11/2019  . Pre-ulcerative calluses 03/04/2019  . BRCA1 positive 06/14/2016    Goals Addressed   None     Follow-Up:  Coordination of Enhanced Pharmacy Services  Reviewed chart for medication changes ahead of medication coordination call.  No OVs, Consults, or hospital visits since last care coordination call/Pharmacist visit.   BP Readings from Last 3 Encounters:  07/08/19 122/82  04/03/19 116/64  06/08/18 134/62    Lab Results  Component Value Date   HGBA1C 6.2 (H) 07/08/2019     Patient obtains medications through [facility]  30 Days   Last adherence delivery included:   Patient declined the following medications last month:  Synjardy and Fetzima - just had filled from [facility] - not due  Levothryoxine just filled for 5 week supply  Trulicity for 3 months - from June - 11 pens left.  lancets and strips - has enough supply for another month   sucralfate - has plenty   Methocarbamol - takes am and pm - should have enough until the following month. Lantus - has enough until next month  Patient is due for next adherence delivery on: 08/30/2019. Called patient and reviewed medications and coordinated delivery.  This delivery to include: Marland Kitchen Synjardy 12.5-1.000mg . Morphine Sulfate 30 mg - 2 times  daily PRN  . Zolpidem 10 mg . One Touch Lancets . One Touch Verio test strips . Lantus Solostar U 100  . Dicyclomine 20 mg . Pantoprazole 40 mg . Methocarbamol 500 mg . Losartan Potassium 50 mg . Famotidine 20 mg . Gabapentin 300 mg . Pravastatin 20 mg . Raloxifene 60 mg . Bupropion Hcl Xl 300 mg . Ergocalciferol 1250 mcg . Potassium chloride ER 10 meq . Levothyroxine 75 mcg    Patient declined the following medications  . Sucralfate- still has plenty of these on hand  . Fetzima- just had a fill for 30 DS 08/25/19 . Zofran- taking daily for nausea- just had a fill 08/25/19  . Trulicity- was given a 65-monthsupply- do not fill   Patient needs refills for Morphine and Zolpidem. Message  sent to Emeterio Reeve for these.   Confirmed delivery date of 08/30/2019, advised patient that pharmacy will contact them the morning of delivery.  Martinique Uselman, [place] Pharmacist Assistant  [phone]

## 2019-08-27 DIAGNOSIS — Z1231 Encounter for screening mammogram for malignant neoplasm of breast: Secondary | ICD-10-CM | POA: Diagnosis not present

## 2019-08-27 LAB — HM MAMMOGRAPHY

## 2019-09-11 DIAGNOSIS — R928 Other abnormal and inconclusive findings on diagnostic imaging of breast: Secondary | ICD-10-CM | POA: Diagnosis not present

## 2019-09-11 DIAGNOSIS — N6489 Other specified disorders of breast: Secondary | ICD-10-CM | POA: Diagnosis not present

## 2019-09-11 DIAGNOSIS — N6315 Unspecified lump in the right breast, overlapping quadrants: Secondary | ICD-10-CM | POA: Diagnosis not present

## 2019-09-13 ENCOUNTER — Telehealth: Payer: Self-pay

## 2019-09-13 NOTE — Telephone Encounter (Signed)
Patient called asking if you advise that she have a 3rd Covid vaccine. States that [facility] told her to see if you recommend it.

## 2019-09-17 NOTE — Telephone Encounter (Signed)
I would recommend she get booster. Kc

## 2019-09-18 NOTE — Telephone Encounter (Signed)
Left detailed vm informing patient of recommendation

## 2019-09-20 ENCOUNTER — Telehealth: Payer: Self-pay

## 2019-09-20 NOTE — Progress Notes (Signed)
Chronic Care Management Pharmacy Assistant   Name: Linda [name]  MRN: [id number] DOB: 02/25/1970  Reason for Encounter: Medication Review  Patient Questions:  1.  Have you seen any other providers since your last visit? No  2.  Any changes in your medicines or health? No    PCP : [name], MD  Allergies:   Allergies  Allergen Reactions  . Codeine Shortness Of Breath  . Celecoxib Other (See Comments)    Unknown reaction  . Ezetimibe-Simvastatin Other (See Comments)    Unknown reaction  . Propranolol Hcl Other (See Comments)    Unknown reaction    Medications: Outpatient Encounter Medications as of 09/20/2019  Medication Sig Note  . Blood Glucose Monitoring Suppl (ONETOUCH VERIO REFLECT) w/Device KIT AS DIRECTED   . Blood Glucose Monitoring Suppl (ONETOUCH VERIO REFLECT) w/Device KIT See admin instructions.   Marland Kitchen buPROPion (WELLBUTRIN XL) 300 MG 24 hr tablet Take 1 tablet (300 mg total) by mouth every evening.   . cetirizine (ZYRTEC) 10 MG tablet Take 10 mg by mouth daily as needed (seasonal allergies).    . clotrimazole-betamethasone (LOTRISONE) cream Apply small amount to affected area twice daily   . dicyclomine (BENTYL) 20 MG tablet TAKE ONE TABLET BY MOUTH BEFORE meals AND AT bedtime as needed FOR stomach cramping.   . Dulaglutide (TRULICITY) 1.60 VP/7.1GG SOPN INJECT ONE syringe ONCE WEEKLY   . Empagliflozin-metFORMIN HCl (SYNJARDY) 12.05-998 MG TABS Take 1 tablet by mouth daily.   . famotidine (PEPCID) 20 MG tablet Take 1 tablet (20 mg total) by mouth 2 (two) times daily.   . ferrous sulfate 325 (65 FE) MG tablet Take 325 mg by mouth every evening.   . gabapentin (NEURONTIN) 300 MG capsule Take 1 capsule (300 mg total) by mouth 2 (two) times daily.   Marland Kitchen glucose blood test strip 1 each by Other route in the morning and at bedtime. One Touch Verio - Patient checks blood sugar 2-3 times daily.   . insulin glargine (LANTUS) 100 unit/mL SOPN Inject 0.68 mLs (68  Units total) into the skin at bedtime.   . Insulin Pen Needle (BD PEN NEEDLE NANO U/F) 32G X 4 MM MISC    . Levomilnacipran HCl ER (FETZIMA) 80 MG CP24 Take 1 capsule by mouth daily.   Marland Kitchen levothyroxine (SYNTHROID) 75 MCG tablet Take 1 tablet (75 mcg total) by mouth daily.   Marland Kitchen losartan (COZAAR) 50 MG tablet Take 1 tablet (50 mg total) by mouth every evening.   . methocarbamol (ROBAXIN) 500 MG tablet Take 1 tablet (500 mg total) by mouth 2 (two) times daily.   Marland Kitchen morphine (MS CONTIN) 30 MG 12 hr tablet Take 1 tablet (30 mg total) by mouth every 12 (twelve) hours.   . Multiple Vitamin (MULTIVITAMIN WITH MINERALS) TABS tablet Take 1 tablet by mouth daily. (Patient not taking: Reported on 07/25/2019) 06/08/2018: Pt plans on purchasing again when she can get out.  . ondansetron (ZOFRAN) 4 MG tablet Take 1 tablet (4 mg total) by mouth 4 (four) times daily as needed for nausea.   Glory Rosebush Delica Lancets 26R MISC 1 each by Does not apply route daily before breakfast. Check blood sugar twice daily.   . pantoprazole (PROTONIX) 40 MG tablet Take 1 tablet (40 mg total) by mouth 2 (two) times daily.   . potassium chloride (MICRO-K) 10 MEQ CR capsule Take 2 capsules (20 mEq total) by mouth 2 (two) times daily.   . pravastatin (PRAVACHOL) 20  MG tablet Take 1 tablet (20 mg total) by mouth at bedtime.   . raloxifene (EVISTA) 60 MG tablet Take 1 tablet (60 mg total) by mouth daily.   Marland Kitchen Specialty Vitamins Products (MAGNESIUM, AMINO ACID CHELATE,) 133 MG tablet Take 1 tablet by mouth at bedtime.   . sucralfate (CARAFATE) 1 g tablet Take 1 tablet (1 g total) by mouth 4 (four) times daily -  with meals and at bedtime.   . Vitamin D, Ergocalciferol, (DRISDOL) 1.25 MG (50000 UNIT) CAPS capsule Take 1 capsule (50,000 Units total) by mouth every Friday. Friday nights   . zolpidem (AMBIEN) 10 MG tablet Take 1 tablet (10 mg total) by mouth at bedtime as needed.    No facility-administered encounter medications on file as of  09/20/2019.    Current Diagnosis: Patient Active Problem List   Diagnosis Date Noted  . Chronic pain syndrome 07/08/2019  . Memory loss 07/08/2019  . Depression, major, recurrent, mild ([place]) 07/08/2019  . Uncomplicated opioid dependence ([place]) 07/08/2019  . Mixed hyperlipidemia 04/11/2019  . Dyslipidemia associated with type 2 diabetes mellitus ([place]) 04/11/2019  . Essential hypertension, benign 04/11/2019  . Major depressive disorder, single episode, mild ([place]) 04/11/2019  . Acquired hypothyroidism 04/11/2019  . Vitamin D insufficiency 04/11/2019  . Class 2 severe obesity due to excess calories with serious comorbidity and body mass index (BMI) of 35.0 to 35.9 in adult ([place]) 04/11/2019  . Pre-ulcerative calluses 03/04/2019  . BRCA1 positive 06/14/2016    Goals Addressed   None     Follow-Up:  Coordination of Enhanced Pharmacy Services   Reviewed chart for medication changes ahead of medication coordination call.  No OVs, Consults, or hospital visits since last care coordination call/Pharmacist visit.   No medication changes indicated.  BP Readings from Last 3 Encounters:  07/08/19 122/82  04/03/19 116/64  06/08/18 134/62    Lab Results  Component Value Date   HGBA1C 6.2 (H) 07/08/2019     Patient obtains medications through Vials  30 Days   Last adherence delivery included:  Marland Kitchen Synjardy 12.5-1.000mg . Morphine Sulfate 30 mg - 2 times daily PRN  . Zolpidem 10 mg . Lantus Solostar U 100  . Dicyclomine 20 mg . Pantoprazole 40 mg . Methocarbamol 500 mg . Losartan Potassium 50 mg . Famotidine 20 mg . Gabapentin 300 mg . Pravastatin 20 mg . Raloxifene 60 mg . Bupropion Hcl Xl 300 mg . Ergocalciferol 1250 mcg . Potassium chloride ER 10 meq . Levothyroxine 75 mcg . Trulicity - has 3 weeks left as of today- 09/20/19- will need on this fill  . One touch - test strips . One Touch - lancets  Patient declined the following medications last month: . Sucralfate- still  has plenty of these on hand  . Fetzima- had acute fill 08/25/19 . Zofran- taking daily for nausea- had acute fill 08/25/19  Patient is due for next adherence delivery on: 09/30/2019. Called patient and reviewed medications and coordinated delivery.  This delivery to include: Marland Kitchen Synjardy 12.5-1.000mg . Morphine Sulfate 30 mg - 2 times daily PRN  . Zolpidem 10 mg . Lantus Solostar U 100  . Dicyclomine 20 mg . Pantoprazole 40 mg . Methocarbamol 500 mg . Losartan Potassium 50 mg . Famotidine 20 mg . Gabapentin 300 mg . Pravastatin 20 mg . Raloxifene 60 mg . Bupropion Hcl Xl 300 mg . Ergocalciferol 1250 mcg . Potassium chloride ER 10 meq . Levothyroxine 75 mcg . Trulicity - has 3 weeks left  as of today- 09/20/19- will need on this fill   Patient declined the following medications: . Sucralfate- still has plenty of these on hand  . Terie Purser- this one is set for delivery on Monday 8/30 per patient  . Zofran- PRN - will call when needed . One Touch Lancets- has plenty . One Touch Verio test strips- has plenty   Patient needs refills for 09/30/2019.  Confirmed delivery date of 09/30/2019, advised patient that pharmacy will contact them the morning of delivery.  Martinique Uselman, [place] Pharmacist Assistant  [phone]

## 2019-09-22 ENCOUNTER — Other Ambulatory Visit: Payer: Self-pay | Admitting: Family Medicine

## 2019-09-22 DIAGNOSIS — Z1501 Genetic susceptibility to malignant neoplasm of breast: Secondary | ICD-10-CM

## 2019-09-22 DIAGNOSIS — Z1509 Genetic susceptibility to other malignant neoplasm: Secondary | ICD-10-CM

## 2019-09-23 ENCOUNTER — Other Ambulatory Visit: Payer: Self-pay

## 2019-09-23 DIAGNOSIS — C50311 Malignant neoplasm of lower-inner quadrant of right female breast: Secondary | ICD-10-CM | POA: Diagnosis not present

## 2019-09-23 DIAGNOSIS — N6314 Unspecified lump in the right breast, lower inner quadrant: Secondary | ICD-10-CM | POA: Diagnosis not present

## 2019-09-23 DIAGNOSIS — C50811 Malignant neoplasm of overlapping sites of right female breast: Secondary | ICD-10-CM | POA: Diagnosis not present

## 2019-09-23 DIAGNOSIS — N6315 Unspecified lump in the right breast, overlapping quadrants: Secondary | ICD-10-CM | POA: Diagnosis not present

## 2019-09-23 DIAGNOSIS — Z1501 Genetic susceptibility to malignant neoplasm of breast: Secondary | ICD-10-CM

## 2019-09-23 MED ORDER — POTASSIUM CHLORIDE ER 10 MEQ PO CPCR: 20.0000 meq | ORAL_CAPSULE | Freq: Two times a day (BID) | ORAL | 1 refills | Status: DC

## 2019-09-23 MED ORDER — MORPHINE SULFATE ER 30 MG PO TBCR: 30.0000 mg | EXTENDED_RELEASE_TABLET | Freq: Two times a day (BID) | ORAL | 0 refills | Status: DC

## 2019-09-23 MED ORDER — ZOLPIDEM TARTRATE 10 MG PO TABS: 10.0000 mg | ORAL_TABLET | Freq: Every evening | ORAL | 0 refills | Status: DC | PRN

## 2019-09-25 DIAGNOSIS — D649 Anemia, unspecified: Secondary | ICD-10-CM | POA: Diagnosis not present

## 2019-09-25 DIAGNOSIS — K7460 Unspecified cirrhosis of liver: Secondary | ICD-10-CM | POA: Diagnosis not present

## 2019-09-25 DIAGNOSIS — Z1501 Genetic susceptibility to malignant neoplasm of breast: Secondary | ICD-10-CM | POA: Diagnosis not present

## 2019-09-25 DIAGNOSIS — D693 Immune thrombocytopenic purpura: Secondary | ICD-10-CM | POA: Diagnosis not present

## 2019-09-25 DIAGNOSIS — C50911 Malignant neoplasm of unspecified site of right female breast: Secondary | ICD-10-CM | POA: Diagnosis not present

## 2019-09-25 DIAGNOSIS — D696 Thrombocytopenia, unspecified: Secondary | ICD-10-CM | POA: Diagnosis not present

## 2019-09-25 HISTORY — PX: BILATERAL TOTAL MASTECTOMY WITH AXILLARY LYMPH NODE DISSECTION: SHX6364

## 2019-09-26 ENCOUNTER — Telehealth: Payer: PPO

## 2019-09-27 DIAGNOSIS — C50911 Malignant neoplasm of unspecified site of right female breast: Secondary | ICD-10-CM | POA: Diagnosis not present

## 2019-09-27 DIAGNOSIS — K7460 Unspecified cirrhosis of liver: Secondary | ICD-10-CM | POA: Diagnosis not present

## 2019-09-27 DIAGNOSIS — M5136 Other intervertebral disc degeneration, lumbar region: Secondary | ICD-10-CM | POA: Diagnosis not present

## 2019-09-27 DIAGNOSIS — M47816 Spondylosis without myelopathy or radiculopathy, lumbar region: Secondary | ICD-10-CM | POA: Diagnosis not present

## 2019-09-27 DIAGNOSIS — M5137 Other intervertebral disc degeneration, lumbosacral region: Secondary | ICD-10-CM | POA: Diagnosis not present

## 2019-09-27 DIAGNOSIS — I700 Atherosclerosis of aorta: Secondary | ICD-10-CM | POA: Diagnosis not present

## 2019-09-27 DIAGNOSIS — R161 Splenomegaly, not elsewhere classified: Secondary | ICD-10-CM | POA: Diagnosis not present

## 2019-10-01 DIAGNOSIS — Z171 Estrogen receptor negative status [ER-]: Secondary | ICD-10-CM | POA: Diagnosis not present

## 2019-10-01 DIAGNOSIS — C50311 Malignant neoplasm of lower-inner quadrant of right female breast: Secondary | ICD-10-CM | POA: Diagnosis not present

## 2019-10-01 DIAGNOSIS — Z1159 Encounter for screening for other viral diseases: Secondary | ICD-10-CM | POA: Diagnosis not present

## 2019-10-01 DIAGNOSIS — Z6837 Body mass index (BMI) 37.0-37.9, adult: Secondary | ICD-10-CM | POA: Diagnosis not present

## 2019-10-09 ENCOUNTER — Other Ambulatory Visit: Payer: Self-pay

## 2019-10-09 ENCOUNTER — Ambulatory Visit (INDEPENDENT_AMBULATORY_CARE_PROVIDER_SITE_OTHER): Payer: PPO | Admitting: Family Medicine

## 2019-10-09 ENCOUNTER — Encounter: Payer: Self-pay | Admitting: Family Medicine

## 2019-10-09 ENCOUNTER — Other Ambulatory Visit: Payer: Self-pay | Admitting: Family Medicine

## 2019-10-09 VITALS — BP 106/60 | HR 88 | Temp 97.5°F | Resp 16 | Ht 62.0 in | Wt 211.0 lb

## 2019-10-09 DIAGNOSIS — Z23 Encounter for immunization: Secondary | ICD-10-CM

## 2019-10-09 DIAGNOSIS — G894 Chronic pain syndrome: Secondary | ICD-10-CM

## 2019-10-09 DIAGNOSIS — E039 Hypothyroidism, unspecified: Secondary | ICD-10-CM

## 2019-10-09 DIAGNOSIS — F332 Major depressive disorder, recurrent severe without psychotic features: Secondary | ICD-10-CM | POA: Diagnosis not present

## 2019-10-09 DIAGNOSIS — E1169 Type 2 diabetes mellitus with other specified complication: Secondary | ICD-10-CM | POA: Diagnosis not present

## 2019-10-09 DIAGNOSIS — Z171 Estrogen receptor negative status [ER-]: Secondary | ICD-10-CM

## 2019-10-09 DIAGNOSIS — E782 Mixed hyperlipidemia: Secondary | ICD-10-CM

## 2019-10-09 DIAGNOSIS — E1121 Type 2 diabetes mellitus with diabetic nephropathy: Secondary | ICD-10-CM | POA: Diagnosis not present

## 2019-10-09 DIAGNOSIS — D508 Other iron deficiency anemias: Secondary | ICD-10-CM | POA: Diagnosis not present

## 2019-10-09 DIAGNOSIS — C50311 Malignant neoplasm of lower-inner quadrant of right female breast: Secondary | ICD-10-CM

## 2019-10-09 DIAGNOSIS — E785 Hyperlipidemia, unspecified: Secondary | ICD-10-CM | POA: Diagnosis not present

## 2019-10-09 DIAGNOSIS — I10 Essential (primary) hypertension: Secondary | ICD-10-CM

## 2019-10-09 LAB — POCT UA - MICROALBUMIN: Microalbumin Ur, POC: 30 mg/L

## 2019-10-09 MED ORDER — LORAZEPAM 0.5 MG PO TABS: 0.5000 mg | ORAL_TABLET | Freq: Two times a day (BID) | ORAL | 1 refills | Status: DC | PRN

## 2019-10-09 NOTE — Progress Notes (Signed)
Subjective:  Patient ID: Linda [name], female    DOB: November 05, 1970  Age: 49 y.o. MRN: [id number]  Chief Complaint  Patient presents with  . Diabetes  . Hypothyroidism  . Breast Cancer   Patient presents for follow up of diabetes, hypothyroidism, and hyperlipidemia.   Diabetes: Sugars 130-250. Has not been eating out a lot since getting moved in. Currently taking synjardy 12.05/998, lantus 68 U daily, and trulicity 2.86 mg. Patient checks her feet daily.   Hyperlipidemia: Pravastatin for cholesterol.   For hypothyroidism currently taking synthroid 75 mcg daily   Hypertension is well controlled on losartan 50 mg once daily.  Chronic pain syndrome with opioid dependence due to low back pain which is managed with gabapentin, robaxin, and morphine ER 30 mg one twice a day.   Depression fairly well controlled on wellbutrin xl and fetzima until recently. Her depression has worsened since breast cancer. She does not wish to change her medicines at this time.  Ambien for insomnia  Breast Cancer-Has upcoming appt for double mastectomy.    GERD-well controlled taking Protonix  Social History   Socioeconomic History  . Marital status: Divorced    Spouse name: Not on file  . Number of children: Not on file  . Years of education: Not on file  . Highest education level: Not on file  Occupational History  . Occupation: disabled  Tobacco Use  . Smoking status: Never Smoker  . Smokeless tobacco: Never Used  Substance and Sexual Activity  . Alcohol use: No  . Drug use: No  . Sexual activity: Yes  Other Topics Concern  . Not on file  Social History Narrative   wears sunscreen, brushes and flosses daily, see's dentist bi-annually, has smoke/carbon monoxide detectors, wears a seatbelt and practices gun safety   Social Determinants of Health   Financial Resource Strain:   . Difficulty of Paying Living Expenses: Not on file  Food Insecurity: No Food Insecurity  . Worried About  Charity fundraiser in the Last Year: Never true  . Ran Out of Food in the Last Year: Never true  Transportation Needs:   . Lack of Transportation (Medical): Not on file  . Lack of Transportation (Non-Medical): Not on file  Physical Activity:   . Days of Exercise per Week: Not on file  . Minutes of Exercise per Session: Not on file  Stress:   . Feeling of Stress : Not on file  Social Connections:   . Frequency of Communication with Friends and Family: Not on file  . Frequency of Social Gatherings with Friends and Family: Not on file  . Attends Religious Services: Not on file  . Active Member of Clubs or Organizations: Not on file  . Attends Archivist Meetings: Not on file  . Marital Status: Not on file   Past Medical History:  Diagnosis Date  . Anemia   . Anxiety   . Arthritis   . BRCA gene mutation positive   . Chronic pain syndrome   . Depression   . Diabetes mellitus without complication ([place])    type 2  . Gastritis   . GERD (gastroesophageal reflux disease)   . History of kidney stones   . Hypertension   . Hypothyroidism   . Mixed hyperlipidemia   . Other primary thrombocytopenia ([place])   . Sleep apnea    hx of . No longer has   Family History  Problem Relation Age of Onset  . Cancer Mother  breast  . CAD Father   . Diabetes Father   . Heart failure Father   . Cancer Father        renal carcinoma  . Kidney failure Father   . Stroke Paternal Grandmother     Review of Systems  Constitutional: Positive for fatigue. Negative for chills and fever.  HENT: Negative for congestion, ear pain and sore throat.   Respiratory: Negative for cough and shortness of breath.   Cardiovascular: Negative for chest pain.  Gastrointestinal: Positive for nausea. Negative for abdominal pain, constipation, diarrhea and vomiting.       See HPI.  Endocrine: Positive for polyphagia. Negative for polydipsia and polyuria.  Genitourinary: Negative for dysuria.    Musculoskeletal: Positive for back pain and myalgias. Negative for arthralgias.       See HPI.   Neurological: Positive for weakness. Negative for headaches.       Memory issues  Psychiatric/Behavioral: Positive for dysphoric mood (well controlled with medications) and sleep disturbance. The patient is not nervous/anxious.        See HPI.     Objective:  BP 106/60   Pulse 88   Temp (!) 97.5 F (36.4 C)   Resp 16   Ht _0  (1.575 m)   Wt 211 lb (95.7 kg)   LMP 06/13/2009   BMI 38.59 kg/m   BP/Weight 10/09/2019 07/08/2019 2/94/7654  Systolic BP 650 354 656  Diastolic BP 60 82 64  Wt. (Lbs) 211 207 203  BMI 38.59 37.86 35.96    Physical Exam Vitals reviewed.  Constitutional:      General: She is not in acute distress.    Appearance: Normal appearance. She is obese.  Neck:     Thyroid: No thyroid mass.     Vascular: No carotid bruit.  Cardiovascular:     Rate and Rhythm: Normal rate and regular rhythm.     Pulses: Normal pulses.     Heart sounds: No murmur heard.   Pulmonary:     Effort: Pulmonary effort is normal.     Breath sounds: Normal breath sounds.  Abdominal:     General: Bowel sounds are normal.     Palpations: Abdomen is soft. There is no mass.     Tenderness: There is no abdominal tenderness.     Comments: Mild diffuse  Musculoskeletal:        General: Tenderness (lumbar back) present.  Neurological:     Mental Status: She is alert and oriented to person, place, and time.     Cranial Nerves: No cranial nerve deficit.  Psychiatric:        Mood and Affect: Mood normal.        Behavior: Behavior normal.     Diabetic Foot Exam - Simple   Simple Foot Form Diabetic Foot exam was performed with the following findings: Yes 10/09/2019  9:31 AM  Visual Inspection See comments: Yes Sensation Testing Intact to touch and monofilament testing bilaterally: Yes Pulse Check Posterior Tibialis and Dorsalis pulse intact bilaterally: Yes Comments Calluses on  BL feet on the bottom. Very dry. Nails long.     Lab Results  Component Value Date   WBC 6.6 07/08/2019   HGB 13.2 07/08/2019   HCT 43.4 07/08/2019   PLT 118 (L) 07/08/2019   GLUCOSE 116 (H) 07/08/2019   CHOL 180 07/08/2019   TRIG 152 (H) 07/08/2019   HDL 54 07/08/2019   LDLCALC 100 (H) 07/08/2019   ALT 16 07/08/2019  AST 27 07/08/2019   NA 143 07/08/2019   K 4.4 07/08/2019   CL 101 07/08/2019   CREATININE 1.11 (H) 07/08/2019   BUN 13 07/08/2019   CO2 22 07/08/2019   TSH 1.450 07/08/2019   HGBA1C 6.2 (H) 07/08/2019   MICROALBUR 80 07/08/2019      Assessment & Plan:    1. Mixed hyperlipidemia Well controlled.  No changes to medicines.  Continue to work on eating a healthy diet and exercise.  Labs drawn today.  - Lipid panel  2. Diabetic nephropathy Well controlled.  No changes to medicines. Consider decreasing medicines in future as a1c has been excellent. Continue to work on eating a healthy diet and exercise.  Labs drawn today.  - Hemoglobin A1c - POCT UA - Microalbumin  3. Essential hypertension, benign Well controlled. The current medical regimen is effective;  continue present plan and medications. - CBC with Differential/Platelet - Comprehensive metabolic panel  4. Major depressive disorder, recurrent, severe The current medical regimen is effective;  continue present plan and medications.  5. Acquired hypothyroidism The current medical regimen is effective;  continue present plan and medications.  6. Morbid obesity with BMI 35 and comorbidities (all) as listed above. Recommend work on weight loss.   Recommend continue to work on eating healthy diet and exercise.  7. Chronic pain syndrome The current medical regimen is effective;  continue present plan and medications.  8. Uncomplicated opioid dependence ([place]) Necessary for control of her pan.   9. Other iron deficiency anemia The current medical regimen is effective;  continue present plan and  medications.  10. Need for immunization against influenza - Flu Vaccine MDCK QUAD PF  11. Malignant neoplasm of lower inner quadrant of right breast in female, estrogen receptor negative ([place])  follow up with Dr. Noberto Retort and Dr. Hinton Rao  Follow-up: Return in about 6 weeks (around 11/20/2019) for chronic follow up. .     AVS was given to patient prior to departure.  [name] [facility] [phone]

## 2019-10-10 ENCOUNTER — Other Ambulatory Visit: Payer: Self-pay

## 2019-10-10 ENCOUNTER — Ambulatory Visit: Payer: PPO | Admitting: Family Medicine

## 2019-10-10 LAB — COMPREHENSIVE METABOLIC PANEL
ALT: 14 IU/L (ref 0–32)
AST: 19 IU/L (ref 0–40)
Albumin/Globulin Ratio: 1.4 (ref 1.2–2.2)
Albumin: 4 g/dL (ref 3.8–4.9)
Alkaline Phosphatase: 104 IU/L (ref 44–121)
BUN/Creatinine Ratio: 11 (ref 9–23)
BUN: 11 mg/dL (ref 6–24)
Bilirubin Total: 0.4 mg/dL (ref 0.0–1.2)
CO2: 23 mmol/L (ref 20–29)
Calcium: 9.5 mg/dL (ref 8.7–10.2)
Chloride: 102 mmol/L (ref 96–106)
Creatinine, Ser: 1.03 mg/dL — ABNORMAL HIGH (ref 0.57–1.00)
GFR calc Af Amer: 73 mL/min/{1.73_m2} (ref >59)
GFR calc non Af Amer: 63 mL/min/{1.73_m2} (ref >59)
Globulin, Total: 2.8 g/dL (ref 1.5–4.5)
Glucose: 123 mg/dL — ABNORMAL HIGH (ref 65–99)
Potassium: 4.3 mmol/L (ref 3.5–5.2)
Sodium: 140 mmol/L (ref 134–144)
Total Protein: 6.8 g/dL (ref 6.0–8.5)

## 2019-10-10 LAB — CBC WITH DIFFERENTIAL/PLATELET
Basophils Absolute: 0 10*3/uL (ref 0.0–0.2)
Basos: 0 %
EOS (ABSOLUTE): 0.1 10*3/uL (ref 0.0–0.4)
Eos: 2 %
Hematocrit: 39.8 % (ref 34.0–46.6)
Hemoglobin: 12.8 g/dL (ref 11.1–15.9)
Immature Grans (Abs): 0 10*3/uL (ref 0.0–0.1)
Immature Granulocytes: 0 %
Lymphocytes Absolute: 2.4 10*3/uL (ref 0.7–3.1)
Lymphs: 31 %
MCH: 27.4 pg (ref 26.6–33.0)
MCHC: 32.2 g/dL (ref 31.5–35.7)
MCV: 85 fL (ref 79–97)
Monocytes Absolute: 0.3 10*3/uL (ref 0.1–0.9)
Monocytes: 4 %
Neutrophils Absolute: 4.7 10*3/uL (ref 1.4–7.0)
Neutrophils: 63 %
Platelets: 132 10*3/uL — ABNORMAL LOW (ref 150–450)
RBC: 4.68 x10E6/uL (ref 3.77–5.28)
RDW: 13.9 % (ref 11.7–15.4)
WBC: 7.5 10*3/uL (ref 3.4–10.8)

## 2019-10-10 LAB — LIPID PANEL
Chol/HDL Ratio: 4.2 ratio (ref 0.0–4.4)
Cholesterol, Total: 181 mg/dL (ref 100–199)
HDL: 43 mg/dL (ref >39)
LDL Chol Calc (NIH): 112 mg/dL — ABNORMAL HIGH (ref 0–99)
Triglycerides: 146 mg/dL (ref 0–149)
VLDL Cholesterol Cal: 26 mg/dL (ref 5–40)

## 2019-10-10 LAB — HEMOGLOBIN A1C
Est. average glucose Bld gHb Est-mCnc: 151 mg/dL
Hgb A1c MFr Bld: 6.9 % — ABNORMAL HIGH (ref 4.8–5.6)

## 2019-10-10 LAB — CARDIOVASCULAR RISK ASSESSMENT

## 2019-10-11 ENCOUNTER — Encounter: Payer: Self-pay | Admitting: Family Medicine

## 2019-10-14 DIAGNOSIS — Z01812 Encounter for preprocedural laboratory examination: Secondary | ICD-10-CM | POA: Diagnosis not present

## 2019-10-14 DIAGNOSIS — C50311 Malignant neoplasm of lower-inner quadrant of right female breast: Secondary | ICD-10-CM | POA: Diagnosis not present

## 2019-10-14 DIAGNOSIS — Z6837 Body mass index (BMI) 37.0-37.9, adult: Secondary | ICD-10-CM | POA: Diagnosis not present

## 2019-10-14 DIAGNOSIS — Z171 Estrogen receptor negative status [ER-]: Secondary | ICD-10-CM | POA: Diagnosis not present

## 2019-10-14 DIAGNOSIS — Z20822 Contact with and (suspected) exposure to covid-19: Secondary | ICD-10-CM | POA: Diagnosis not present

## 2019-10-14 MED ORDER — BD PEN NEEDLE NANO U/F 32G X 4 MM MISC: 1.0000 | Freq: Two times a day (BID) | 3 refills | Status: DC

## 2019-10-14 MED ORDER — PRAVASTATIN SODIUM 40 MG PO TABS: 40.0000 mg | ORAL_TABLET | Freq: Every day | ORAL | 0 refills | Status: DC

## 2019-10-15 ENCOUNTER — Other Ambulatory Visit: Payer: Self-pay | Admitting: Family Medicine

## 2019-10-15 DIAGNOSIS — Z6835 Body mass index (BMI) 35.0-35.9, adult: Secondary | ICD-10-CM

## 2019-10-18 DIAGNOSIS — Z981 Arthrodesis status: Secondary | ICD-10-CM | POA: Diagnosis not present

## 2019-10-18 DIAGNOSIS — E669 Obesity, unspecified: Secondary | ICD-10-CM | POA: Diagnosis not present

## 2019-10-18 DIAGNOSIS — E039 Hypothyroidism, unspecified: Secondary | ICD-10-CM | POA: Diagnosis not present

## 2019-10-18 DIAGNOSIS — J302 Other seasonal allergic rhinitis: Secondary | ICD-10-CM | POA: Diagnosis not present

## 2019-10-18 DIAGNOSIS — E559 Vitamin D deficiency, unspecified: Secondary | ICD-10-CM | POA: Diagnosis not present

## 2019-10-18 DIAGNOSIS — G4733 Obstructive sleep apnea (adult) (pediatric): Secondary | ICD-10-CM | POA: Diagnosis not present

## 2019-10-18 DIAGNOSIS — Z9989 Dependence on other enabling machines and devices: Secondary | ICD-10-CM | POA: Diagnosis not present

## 2019-10-18 DIAGNOSIS — E119 Type 2 diabetes mellitus without complications: Secondary | ICD-10-CM | POA: Diagnosis not present

## 2019-10-18 DIAGNOSIS — M26609 Unspecified temporomandibular joint disorder, unspecified side: Secondary | ICD-10-CM | POA: Diagnosis not present

## 2019-10-18 DIAGNOSIS — I10 Essential (primary) hypertension: Secondary | ICD-10-CM | POA: Diagnosis not present

## 2019-10-18 DIAGNOSIS — Z79899 Other long term (current) drug therapy: Secondary | ICD-10-CM | POA: Diagnosis not present

## 2019-10-18 DIAGNOSIS — E782 Mixed hyperlipidemia: Secondary | ICD-10-CM | POA: Diagnosis not present

## 2019-10-18 DIAGNOSIS — Z6837 Body mass index (BMI) 37.0-37.9, adult: Secondary | ICD-10-CM | POA: Diagnosis not present

## 2019-10-18 DIAGNOSIS — C50311 Malignant neoplasm of lower-inner quadrant of right female breast: Secondary | ICD-10-CM | POA: Diagnosis not present

## 2019-10-18 DIAGNOSIS — K219 Gastro-esophageal reflux disease without esophagitis: Secondary | ICD-10-CM | POA: Diagnosis not present

## 2019-10-18 DIAGNOSIS — J90 Pleural effusion, not elsewhere classified: Secondary | ICD-10-CM | POA: Diagnosis not present

## 2019-10-18 DIAGNOSIS — N6032 Fibrosclerosis of left breast: Secondary | ICD-10-CM | POA: Diagnosis not present

## 2019-10-18 DIAGNOSIS — Z1501 Genetic susceptibility to malignant neoplasm of breast: Secondary | ICD-10-CM | POA: Diagnosis not present

## 2019-10-18 DIAGNOSIS — G629 Polyneuropathy, unspecified: Secondary | ICD-10-CM | POA: Diagnosis not present

## 2019-10-18 DIAGNOSIS — C50911 Malignant neoplasm of unspecified site of right female breast: Secondary | ICD-10-CM | POA: Diagnosis not present

## 2019-10-21 DIAGNOSIS — C50911 Malignant neoplasm of unspecified site of right female breast: Secondary | ICD-10-CM | POA: Diagnosis not present

## 2019-10-21 DIAGNOSIS — F419 Anxiety disorder, unspecified: Secondary | ICD-10-CM | POA: Diagnosis not present

## 2019-10-21 DIAGNOSIS — M545 Low back pain: Secondary | ICD-10-CM | POA: Diagnosis not present

## 2019-10-21 DIAGNOSIS — Z1501 Genetic susceptibility to malignant neoplasm of breast: Secondary | ICD-10-CM | POA: Diagnosis not present

## 2019-10-21 DIAGNOSIS — Z6837 Body mass index (BMI) 37.0-37.9, adult: Secondary | ICD-10-CM | POA: Diagnosis not present

## 2019-10-21 DIAGNOSIS — K2970 Gastritis, unspecified, without bleeding: Secondary | ICD-10-CM | POA: Diagnosis not present

## 2019-10-21 DIAGNOSIS — G894 Chronic pain syndrome: Secondary | ICD-10-CM | POA: Diagnosis not present

## 2019-10-21 DIAGNOSIS — Z794 Long term (current) use of insulin: Secondary | ICD-10-CM | POA: Diagnosis not present

## 2019-10-21 DIAGNOSIS — M1990 Unspecified osteoarthritis, unspecified site: Secondary | ICD-10-CM | POA: Diagnosis not present

## 2019-10-21 DIAGNOSIS — Z79891 Long term (current) use of opiate analgesic: Secondary | ICD-10-CM | POA: Diagnosis not present

## 2019-10-21 DIAGNOSIS — F332 Major depressive disorder, recurrent severe without psychotic features: Secondary | ICD-10-CM | POA: Diagnosis not present

## 2019-10-21 DIAGNOSIS — E1140 Type 2 diabetes mellitus with diabetic neuropathy, unspecified: Secondary | ICD-10-CM | POA: Diagnosis not present

## 2019-10-21 DIAGNOSIS — Z4801 Encounter for change or removal of surgical wound dressing: Secondary | ICD-10-CM | POA: Diagnosis not present

## 2019-10-21 DIAGNOSIS — K219 Gastro-esophageal reflux disease without esophagitis: Secondary | ICD-10-CM | POA: Diagnosis not present

## 2019-10-21 DIAGNOSIS — C50311 Malignant neoplasm of lower-inner quadrant of right female breast: Secondary | ICD-10-CM | POA: Diagnosis not present

## 2019-10-21 DIAGNOSIS — D508 Other iron deficiency anemias: Secondary | ICD-10-CM | POA: Diagnosis not present

## 2019-10-21 DIAGNOSIS — Z483 Aftercare following surgery for neoplasm: Secondary | ICD-10-CM | POA: Diagnosis not present

## 2019-10-21 DIAGNOSIS — I10 Essential (primary) hypertension: Secondary | ICD-10-CM | POA: Diagnosis not present

## 2019-10-21 DIAGNOSIS — E1121 Type 2 diabetes mellitus with diabetic nephropathy: Secondary | ICD-10-CM | POA: Diagnosis not present

## 2019-10-21 DIAGNOSIS — E039 Hypothyroidism, unspecified: Secondary | ICD-10-CM | POA: Diagnosis not present

## 2019-10-21 DIAGNOSIS — Z6834 Body mass index (BMI) 34.0-34.9, adult: Secondary | ICD-10-CM | POA: Diagnosis not present

## 2019-10-21 DIAGNOSIS — E782 Mixed hyperlipidemia: Secondary | ICD-10-CM | POA: Diagnosis not present

## 2019-10-21 DIAGNOSIS — D6949 Other primary thrombocytopenia: Secondary | ICD-10-CM | POA: Diagnosis not present

## 2019-10-25 ENCOUNTER — Telehealth: Payer: Self-pay

## 2019-10-25 NOTE — Chronic Care Management (AMB) (Signed)
Chronic Care Management Pharmacy Assistant   Name: Linda [name]  MRN: [id number] DOB: 01/20/1971  Reason for Encounter: Medication Review  PCP : [name], MD  Allergies:   Allergies  Allergen Reactions  . Codeine Shortness Of Breath  . Celecoxib Other (See Comments)    Unknown reaction  . Ezetimibe-Simvastatin Other (See Comments)    Unknown reaction  . Propranolol Hcl Other (See Comments)    Unknown reaction    Medications: Outpatient Encounter Medications as of 10/25/2019  Medication Sig Note  . Blood Glucose Monitoring Suppl (ONETOUCH VERIO REFLECT) w/Device KIT AS DIRECTED   . Blood Glucose Monitoring Suppl (ONETOUCH VERIO REFLECT) w/Device KIT See admin instructions.   Marland Kitchen buPROPion (WELLBUTRIN XL) 300 MG 24 hr tablet Take 1 tablet (300 mg total) by mouth every evening.   . cetirizine (ZYRTEC) 10 MG tablet Take 10 mg by mouth daily as needed (seasonal allergies).    . clotrimazole-betamethasone (LOTRISONE) cream Apply small amount to affected area twice daily   . dicyclomine (BENTYL) 20 MG tablet TAKE ONE TABLET BY MOUTH BEFORE meals AND AT bedtime as needed FOR stomach cramping.   . Dulaglutide (TRULICITY) 3.23 FT/7.3UK SOPN INJECT ONE syringe ONCE WEEKLY   . Empagliflozin-metFORMIN HCl (SYNJARDY) 12.05-998 MG TABS Take 1 tablet by mouth daily.   . famotidine (PEPCID) 20 MG tablet Take 1 tablet (20 mg total) by mouth 2 (two) times daily.   . ferrous sulfate 325 (65 FE) MG tablet Take 325 mg by mouth every evening.   . gabapentin (NEURONTIN) 300 MG capsule Take 1 capsule (300 mg total) by mouth 2 (two) times daily.   Marland Kitchen glucose blood test strip 1 each by Other route in the morning and at bedtime. One Touch Verio - Patient checks blood sugar 2-3 times daily.   . Insulin Pen Needle (BD PEN NEEDLE NANO U/F) 32G X 4 MM MISC Inject 1 each into the skin in the morning and at bedtime.   Marland Kitchen LANTUS SOLOSTAR 100 UNIT/ML Solostar Pen INJECT 68 UNITS SUBCUTANEOUSLY EVERYDAY  AT BEDTIME   . Levomilnacipran HCl ER (FETZIMA) 80 MG CP24 Take 1 capsule by mouth daily.   Marland Kitchen levothyroxine (SYNTHROID) 75 MCG tablet Take 1 tablet (75 mcg total) by mouth daily.   Marland Kitchen LORazepam (ATIVAN) 0.5 MG tablet Take 1 tablet (0.5 mg total) by mouth 2 (two) times daily as needed for anxiety.   Marland Kitchen losartan (COZAAR) 50 MG tablet Take 1 tablet (50 mg total) by mouth every evening.   . methocarbamol (ROBAXIN) 500 MG tablet Take 1 tablet (500 mg total) by mouth 2 (two) times daily.   Marland Kitchen morphine (MS CONTIN) 30 MG 12 hr tablet Take 1 tablet (30 mg total) by mouth every 12 (twelve) hours.   . Multiple Vitamin (MULTIVITAMIN WITH MINERALS) TABS tablet Take 1 tablet by mouth daily. (Patient not taking: Reported on 07/25/2019) 06/08/2018: Pt plans on purchasing again when she can get out.  . ondansetron (ZOFRAN) 4 MG tablet TAKE ONE TABLET BY MOUTH FOUR TIMES DAILY AS NEEDED FOR NAUSEA   . ondansetron (ZOFRAN-ODT) 4 MG disintegrating tablet Take 4 times a day as needed   . OneTouch Delica Lancets 02R MISC 1 each by Does not apply route daily before breakfast. Check blood sugar twice daily.   . pantoprazole (PROTONIX) 40 MG tablet Take 1 tablet (40 mg total) by mouth 2 (two) times daily.   . potassium chloride (MICRO-K) 10 MEQ CR capsule Take 2 capsules (20 mEq  total) by mouth 2 (two) times daily.   . pravastatin (PRAVACHOL) 40 MG tablet Take 1 tablet (40 mg total) by mouth daily.   Marland Kitchen Specialty Vitamins Products (MAGNESIUM, AMINO ACID CHELATE,) 133 MG tablet Take 1 tablet by mouth at bedtime.   . sucralfate (CARAFATE) 1 g tablet Take 1 tablet (1 g total) by mouth 4 (four) times daily -  with meals and at bedtime.   . Vitamin D, Ergocalciferol, (DRISDOL) 1.25 MG (50000 UNIT) CAPS capsule Take 1 capsule (50,000 Units total) by mouth every Friday. Friday nights   . zolpidem (AMBIEN) 10 MG tablet Take 1 tablet (10 mg total) by mouth at bedtime as needed.    No facility-administered encounter medications on file as  of 10/25/2019.    Current Diagnosis: Patient Active Problem List   Diagnosis Date Noted  . Malignant neoplasm of lower-inner quadrant of right breast of female, estrogen receptor negative ([place]) 10/01/2019  . Chronic pain syndrome 07/08/2019  . Memory loss 07/08/2019  . Depression, major, recurrent, mild ([place]) 07/08/2019  . Uncomplicated opioid dependence ([place]) 07/08/2019  . Mixed hyperlipidemia 04/11/2019  . Dyslipidemia associated with type 2 diabetes mellitus ([place]) 04/11/2019  . Essential hypertension, benign 04/11/2019  . Major depressive disorder, single episode, mild ([place]) 04/11/2019  . Acquired hypothyroidism 04/11/2019  . Vitamin D insufficiency 04/11/2019  . Class 2 severe obesity due to excess calories with serious comorbidity and body mass index (BMI) of 35.0 to 35.9 in adult ([place]) 04/11/2019  . Pre-ulcerative calluses 03/04/2019  . BRCA1 positive 06/14/2016   Reviewed chart for medication changes ahead of medication coordination call.   OVs, Consult- 10/01/19, or hospital visits since last care coordination call/Pharmacist visit. (If appropriate, list visit date, provider name)  No medication changes indicated OR if recent visit, treatment plan here.  BP Readings from Last 3 Encounters:  10/09/19 106/60  07/08/19 122/82  04/03/19 116/64    Lab Results  Component Value Date   HGBA1C 6.9 (H) 10/09/2019     Patient obtains medications through Vials  30 Days   Last adherence delivery included: Synjardy 12.5-1.000mg - 1 tablet daily, Morphine Sulfate 30 mg - 2 times daily PRN, Zolpidem 10 mg- 1 tablet as needed at bedtime, Lantus Solostar U 100 - inject 0.68 mLs into skin at bedtime, Dicyclomine 20 mg- 1 tablet as needed before meals and bedtime, Pantoprazole 40 mg- 1 tablet twice daily, Methocarbamol 500 mg- 1 tablet twice daily,  Losartan Potassium 50 mg- 1 tablet in evening, Famotidine 20 mg - 1 tablet twice daily, Gabapentin 300 mg - 1 capsule twice daily, Pravastatin 20  mg- 1 tablet at bedtime, Raloxifene 60 mg - 1 tablet daily, Bupropion Hcl Xl 300 mg- 1 tablet in evening, Ergocalciferol 1250 mcg- 1 capsule weekly on Friday's, Potassium chloride ER 10 meq - 2 capsules twice daily, Levothyroxine 75 mcg- 1 tablet daily, Trulicity - has 3 weeks left as of today- 09/20/19- will need on this fill   Patient declined Sucralfate- still has plenty of these on hand, Fetzima- had acute fill 08/25/19, Zofran- taking daily for nausea- had acute fill 08/25/19 last month due to PRN use/additional supply on hand.  Patient is due for next adherence delivery on: 10/30/19. Called patient and reviewed medications and coordinated delivery.  This delivery to include: Patient will need a short fill of  Fetzima 80 mg and Potassium 10 meq, prior to adherence delivery. (To align with sync date or if PRN med)  Coordinated acute fill for  Fetzima 80 mg and Potassium 10 meq to be delivered 10/28/19.  Patient needs refills for Synjardy 12.5-1.000mg - 1 tablet daily, Morphine Sulfate 30 mg - 2 times daily PRN, Zolpidem 10 mg- 1 tablet as needed at bedtime, Lantus Solostar U 100 - inject 0.68 mLs into skin at bedtime, Dicyclomine 20 mg- 1 tablet as needed before meals and bedtime, Pantoprazole 40 mg- 1 tablet twice daily, Methocarbamol 500 mg- 1 tablet twice daily,  Losartan Potassium 50 mg- 1 tablet in evening, Famotidine 20 mg - 1 tablet twice daily, Gabapentin 300 mg - 1 capsule twice daily, Pravastatin 20 mg- 1 tablet at bedtime, Bupropion Hcl Xl 300 mg- 1 tablet in evening, Ergocalciferol 1250 mcg- 1 capsule weekly on Friday's, Potassium chloride ER 10 meq - 2 capsules twice daily, Levothyroxine 75 mcg- 1 tablet daily, Trulicity- inject 0.5 mg sq weekly, Fetzima- 80 mg- 1 capsule daily,  Ondansetron 4 mg- 1 tablet four times a day as needed for nausea, Lorazepam 0.5 mg- 1 tablet twice a day as needed.  Confirmed delivery date of 10/30/19, advised patient that pharmacy will contact them the morning  of delivery.  Follow-Up:  Comptroller and Pharmacist Review   [place] Pharmacist Assistant  [phone]

## 2019-10-28 ENCOUNTER — Other Ambulatory Visit: Payer: Self-pay

## 2019-10-28 DIAGNOSIS — Z1501 Genetic susceptibility to malignant neoplasm of breast: Secondary | ICD-10-CM

## 2019-10-28 DIAGNOSIS — Z1509 Genetic susceptibility to other malignant neoplasm: Secondary | ICD-10-CM

## 2019-10-28 MED ORDER — MORPHINE SULFATE ER 30 MG PO TBCR
30.0000 mg | EXTENDED_RELEASE_TABLET | Freq: Two times a day (BID) | ORAL | 0 refills | Status: DC
Start: 2019-10-28 — End: 2019-11-28

## 2019-10-28 MED ORDER — ZOLPIDEM TARTRATE 10 MG PO TABS: 10.0000 mg | ORAL_TABLET | Freq: Every evening | ORAL | 0 refills | Status: DC | PRN

## 2019-10-29 ENCOUNTER — Other Ambulatory Visit: Payer: Self-pay | Admitting: Physician Assistant

## 2019-10-30 ENCOUNTER — Telehealth: Payer: PPO

## 2019-11-03 ENCOUNTER — Other Ambulatory Visit: Payer: Self-pay | Admitting: Family Medicine

## 2019-11-03 DIAGNOSIS — F332 Major depressive disorder, recurrent severe without psychotic features: Secondary | ICD-10-CM

## 2019-11-07 DIAGNOSIS — D649 Anemia, unspecified: Secondary | ICD-10-CM | POA: Diagnosis not present

## 2019-11-07 DIAGNOSIS — C50911 Malignant neoplasm of unspecified site of right female breast: Secondary | ICD-10-CM | POA: Diagnosis not present

## 2019-11-08 ENCOUNTER — Other Ambulatory Visit: Payer: Self-pay | Admitting: Oncology

## 2019-11-08 DIAGNOSIS — Z171 Estrogen receptor negative status [ER-]: Secondary | ICD-10-CM

## 2019-11-08 DIAGNOSIS — C50311 Malignant neoplasm of lower-inner quadrant of right female breast: Secondary | ICD-10-CM

## 2019-11-13 DIAGNOSIS — T451X1A Poisoning by antineoplastic and immunosuppressive drugs, accidental (unintentional), initial encounter: Secondary | ICD-10-CM | POA: Diagnosis not present

## 2019-11-13 DIAGNOSIS — C50311 Malignant neoplasm of lower-inner quadrant of right female breast: Secondary | ICD-10-CM | POA: Diagnosis not present

## 2019-11-15 ENCOUNTER — Encounter: Payer: Self-pay | Admitting: Hematology and Oncology

## 2019-11-15 ENCOUNTER — Other Ambulatory Visit: Payer: Self-pay

## 2019-11-15 ENCOUNTER — Encounter: Payer: Self-pay | Admitting: Pharmacist

## 2019-11-15 ENCOUNTER — Inpatient Hospital Stay: Payer: PPO | Attending: Hematology and Oncology | Admitting: Hematology and Oncology

## 2019-11-15 DIAGNOSIS — D649 Anemia, unspecified: Secondary | ICD-10-CM | POA: Diagnosis not present

## 2019-11-15 DIAGNOSIS — C50811 Malignant neoplasm of overlapping sites of right female breast: Secondary | ICD-10-CM | POA: Insufficient documentation

## 2019-11-15 DIAGNOSIS — C50311 Malignant neoplasm of lower-inner quadrant of right female breast: Secondary | ICD-10-CM

## 2019-11-15 DIAGNOSIS — Z5189 Encounter for other specified aftercare: Secondary | ICD-10-CM | POA: Insufficient documentation

## 2019-11-15 DIAGNOSIS — Z171 Estrogen receptor negative status [ER-]: Secondary | ICD-10-CM | POA: Insufficient documentation

## 2019-11-15 DIAGNOSIS — Z170 Estrogen receptor positive status [ER+]: Secondary | ICD-10-CM

## 2019-11-15 DIAGNOSIS — Z5111 Encounter for antineoplastic chemotherapy: Secondary | ICD-10-CM | POA: Insufficient documentation

## 2019-11-15 LAB — BASIC METABOLIC PANEL
BUN: 11 (ref 4–21)
CO2: 24 — AB (ref 13–22)
Chloride: 105 (ref 99–108)
Creatinine: 0.8 (ref 0.5–1.1)
Glucose: 263
Potassium: 3.8 (ref 3.4–5.3)
Sodium: 140 (ref 137–147)

## 2019-11-15 LAB — CBC AND DIFFERENTIAL
HCT: 35 — AB (ref 36–46)
Hemoglobin: 11.1 — AB (ref 12.0–16.0)
Neutrophils Absolute: 2.11
Platelets: 90 — AB (ref 150–399)
WBC: 3.7

## 2019-11-15 LAB — HEPATIC FUNCTION PANEL
ALT: 31 (ref 7–35)
AST: 40 — AB (ref 13–35)
Alkaline Phosphatase: 92 (ref 25–125)
Bilirubin, Total: 0.4

## 2019-11-15 LAB — COMPREHENSIVE METABOLIC PANEL
Albumin: 3.7 (ref 3.5–5.0)
Calcium: 9.2 (ref 8.7–10.7)

## 2019-11-15 LAB — CBC: RBC: 4.16 (ref 3.87–5.11)

## 2019-11-18 ENCOUNTER — Inpatient Hospital Stay: Payer: PPO

## 2019-11-18 ENCOUNTER — Telehealth: Payer: Self-pay | Admitting: Oncology

## 2019-11-18 ENCOUNTER — Other Ambulatory Visit: Payer: Self-pay | Admitting: Family Medicine

## 2019-11-18 ENCOUNTER — Other Ambulatory Visit: Payer: Self-pay

## 2019-11-18 VITALS — BP 122/63 | HR 88 | Temp 98.4°F | Resp 18 | Wt 207.0 lb

## 2019-11-18 DIAGNOSIS — Z171 Estrogen receptor negative status [ER-]: Secondary | ICD-10-CM | POA: Diagnosis not present

## 2019-11-18 DIAGNOSIS — Z5111 Encounter for antineoplastic chemotherapy: Secondary | ICD-10-CM | POA: Diagnosis not present

## 2019-11-18 DIAGNOSIS — C50311 Malignant neoplasm of lower-inner quadrant of right female breast: Secondary | ICD-10-CM

## 2019-11-18 DIAGNOSIS — C50811 Malignant neoplasm of overlapping sites of right female breast: Secondary | ICD-10-CM | POA: Diagnosis not present

## 2019-11-18 DIAGNOSIS — Z5189 Encounter for other specified aftercare: Secondary | ICD-10-CM | POA: Diagnosis not present

## 2019-11-18 MED ORDER — HEPARIN SOD (PORK) LOCK FLUSH 100 UNIT/ML IV SOLN
500.0000 [IU] | Freq: Once | INTRAVENOUS | Status: AC | PRN
  Administered 2019-11-18: 500 [IU]
  Filled 2019-11-18: qty 5

## 2019-11-18 MED ORDER — DOXORUBICIN HCL CHEMO IV INJECTION 2 MG/ML
60.0000 mg/m2 | Freq: Once | INTRAVENOUS | Status: AC
  Administered 2019-11-18: 124 mg via INTRAVENOUS
  Filled 2019-11-18: qty 62

## 2019-11-18 MED ORDER — SODIUM CHLORIDE 0.9 % IV SOLN
Freq: Once | INTRAVENOUS | Status: AC
  Filled 2019-11-18: qty 250

## 2019-11-18 MED ORDER — SODIUM CHLORIDE 0.9% FLUSH
10.0000 mL | INTRAVENOUS | Status: DC | PRN
  Administered 2019-11-18: 10 mL
  Filled 2019-11-18: qty 10

## 2019-11-18 MED ORDER — PALONOSETRON HCL INJECTION 0.25 MG/5ML
0.2500 mg | Freq: Once | INTRAVENOUS | Status: AC
  Administered 2019-11-18: 0.25 mg via INTRAVENOUS

## 2019-11-18 MED ORDER — SODIUM CHLORIDE 0.9 % IV SOLN
600.0000 mg/m2 | Freq: Once | INTRAVENOUS | Status: AC
  Administered 2019-11-18: 1240 mg via INTRAVENOUS
  Filled 2019-11-18: qty 62

## 2019-11-18 MED ORDER — SODIUM CHLORIDE 0.9 % IV SOLN
10.0000 mg | Freq: Once | INTRAVENOUS | Status: AC
  Administered 2019-11-18: 10 mg via INTRAVENOUS
  Filled 2019-11-18: qty 10

## 2019-11-18 MED ORDER — SODIUM CHLORIDE 0.9 % IV SOLN
150.0000 mg | Freq: Once | INTRAVENOUS | Status: AC
  Administered 2019-11-18: 150 mg via INTRAVENOUS
  Filled 2019-11-18: qty 150

## 2019-11-18 MED ORDER — PALONOSETRON HCL INJECTION 0.25 MG/5ML
INTRAVENOUS | Status: AC
  Filled 2019-11-18: qty 5

## 2019-11-18 NOTE — Progress Notes (Signed)
PT STABLE AT TIME OF DISCHARGE

## 2019-11-18 NOTE — Patient Instructions (Signed)
Cytoxan Cyclophosphamide Injection What is this medicine? CYCLOPHOSPHAMIDE (sye kloe FOSS fa mide) is a chemotherapy drug. It slows the growth of cancer cells. This medicine is used to treat many types of cancer like lymphoma, myeloma, leukemia, breast cancer, and ovarian cancer, to name a few. This medicine may be used for other purposes; ask your health care provider or pharmacist if you have questions. COMMON BRAND NAME(S): Cytoxan, Neosar What should I tell my health care provider before I take this medicine? They need to know if you have any of these conditions:  heart disease  history of irregular heartbeat  infection  kidney disease  liver disease  low blood counts, like white cells, platelets, or red blood cells  on hemodialysis  recent or ongoing radiation therapy  scarring or thickening of the lungs  trouble passing urine  an unusual or allergic reaction to cyclophosphamide, other medicines, foods, dyes, or preservatives  pregnant or trying to get pregnant  breast-feeding How should I use this medicine? This drug is usually given as an injection into a vein or muscle or by infusion into a vein. It is administered in a hospital or clinic by a specially trained health care professional. Talk to your pediatrician regarding the use of this medicine in children. Special care may be needed. Overdosage: If you think you have taken too much of this medicine contact a poison control center or emergency room at once. NOTE: This medicine is only for you. Do not share this medicine with others. What if I miss a dose? It is important not to miss your dose. Call your doctor or health care professional if you are unable to keep an appointment. What may interact with this medicine?  amphotericin B  azathioprine  certain antivirals for HIV or hepatitis  certain medicines for blood pressure, heart disease, irregular heart beat  certain medicines that treat or prevent blood  clots like warfarin  certain other medicines for cancer  cyclosporine  etanercept  indomethacin  medicines that relax muscles for surgery  medicines to increase blood counts  metronidazole This list may not describe all possible interactions. Give your health care provider a list of all the medicines, herbs, non-prescription drugs, or dietary supplements you use. Also tell them if you smoke, drink alcohol, or use illegal drugs. Some items may interact with your medicine. What should I watch for while using this medicine? Your condition will be monitored carefully while you are receiving this medicine. You may need blood work done while you are taking this medicine. Drink water or other fluids as directed. Urinate often, even at night. Some products may contain alcohol. Ask your health care professional if this medicine contains alcohol. Be sure to tell all health care professionals you are taking this medicine. Certain medicines, like metronidazole and disulfiram, can cause an unpleasant reaction when taken with alcohol. The reaction includes flushing, headache, nausea, vomiting, sweating, and increased thirst. The reaction can last from 30 minutes to several hours. Do not become pregnant while taking this medicine or for 1 year after stopping it. Women should inform their health care professional if they wish to become pregnant or think they might be pregnant. Men should not father a child while taking this medicine and for 4 months after stopping it. There is potential for serious side effects to an unborn child. Talk to your health care professional for more information. Do not breast-feed an infant while taking this medicine or for 1 week after stopping it. This medicine  has caused ovarian failure in some women. This medicine may make it more difficult to get pregnant. Talk to your health care professional if you are concerned about your fertility. This medicine has caused decreased sperm  counts in some men. This may make it more difficult to father a child. Talk to your health care professional if you are concerned about your fertility. Call your health care professional for advice if you get a fever, chills, or sore throat, or other symptoms of a cold or flu. Do not treat yourself. This medicine decreases your body's ability to fight infections. Try to avoid being around people who are sick. Avoid taking medicines that contain aspirin, acetaminophen, ibuprofen, naproxen, or ketoprofen unless instructed by your health care professional. These medicines may hide a fever. Talk to your health care professional about your risk of cancer. You may be more at risk for certain types of cancer if you take this medicine. If you are going to need surgery or other procedure, tell your health care professional that you are using this medicine. Be careful brushing or flossing your teeth or using a toothpick because you may get an infection or bleed more easily. If you have any dental work done, tell your dentist you are receiving this medicine. What side effects may I notice from receiving this medicine? Side effects that you should report to your doctor or health care professional as soon as possible:  allergic reactions like skin rash, itching or hives, swelling of the face, lips, or tongue  breathing problems  nausea, vomiting  signs and symptoms of bleeding such as bloody or black, tarry stools; red or dark brown urine; spitting up blood or brown material that looks like coffee grounds; red spots on the skin; unusual bruising or bleeding from the eyes, gums, or nose  signs and symptoms of heart failure like fast, irregular heartbeat, sudden weight gain; swelling of the ankles, feet, hands  signs and symptoms of infection like fever; chills; cough; sore throat; pain or trouble passing urine  signs and symptoms of kidney injury like trouble passing urine or change in the amount of  urine  signs and symptoms of liver injury like dark yellow or brown urine; general ill feeling or flu-like symptoms; light-colored stools; loss of appetite; nausea; right upper belly pain; unusually weak or tired; yellowing of the eyes or skin Side effects that usually do not require medical attention (report to your doctor or health care professional if they continue or are bothersome):  confusion  decreased hearing  diarrhea  facial flushing  hair loss  headache  loss of appetite  missed menstrual periods  signs and symptoms of low red blood cells or anemia such as unusually weak or tired; feeling faint or lightheaded; falls  skin discoloration This list may not describe all possible side effects. Call your doctor for medical advice about side effects. You may report side effects to FDA at 1-800-FDA-1088. Where should I keep my medicine? This drug is given in a hospital or clinic and will not be stored at home. NOTE: This sheet is a summary. It may not cover all possible information. If you have questions about this medicine, talk to your doctor, pharmacist, or health care provider.  2020 [employer] Standard (2018-10-15 09:53:29) Doxorubicin injection What is this medicine? DOXORUBICIN (dox oh ROO bi sin) is a chemotherapy drug. It is used to treat many kinds of cancer like leukemia, lymphoma, neuroblastoma, sarcoma, and Wilms' tumor. It is also used to treat  bladder cancer, breast cancer, lung cancer, ovarian cancer, stomach cancer, and thyroid cancer. This medicine may be used for other purposes; ask your health care provider or pharmacist if you have questions. COMMON BRAND NAME(S): Adriamycin, Adriamycin PFS, Adriamycin RDF, Rubex What should I tell my health care provider before I take this medicine? They need to know if you have any of these conditions:  heart disease  history of low blood counts caused by a medicine  liver disease  recent or ongoing radiation  therapy  an unusual or allergic reaction to doxorubicin, other chemotherapy agents, other medicines, foods, dyes, or preservatives  pregnant or trying to get pregnant  breast-feeding How should I use this medicine? This drug is given as an infusion into a vein. It is administered in a hospital or clinic by a specially trained health care professional. If you have pain, swelling, burning or any unusual feeling around the site of your injection, tell your health care professional right away. Talk to your pediatrician regarding the use of this medicine in children. Special care may be needed. Overdosage: If you think you have taken too much of this medicine contact a poison control center or emergency room at once. NOTE: This medicine is only for you. Do not share this medicine with others. What if I miss a dose? It is important not to miss your dose. Call your doctor or health care professional if you are unable to keep an appointment. What may interact with this medicine? This medicine may interact with the following medications:  6-mercaptopurine  paclitaxel  phenytoin  St. John's Wort  trastuzumab  verapamil This list may not describe all possible interactions. Give your health care provider a list of all the medicines, herbs, non-prescription drugs, or dietary supplements you use. Also tell them if you smoke, drink alcohol, or use illegal drugs. Some items may interact with your medicine. What should I watch for while using this medicine? This drug may make you feel generally unwell. This is not uncommon, as chemotherapy can affect healthy cells as well as cancer cells. Report any side effects. Continue your course of treatment even though you feel ill unless your doctor tells you to stop. There is a maximum amount of this medicine you should receive throughout your life. The amount depends on the medical condition being treated and your overall health. Your doctor will watch how  much of this medicine you receive in your lifetime. Tell your doctor if you have taken this medicine before. You may need blood work done while you are taking this medicine. Your urine may turn red for a few days after your dose. This is not blood. If your urine is dark or brown, call your doctor. In some cases, you may be given additional medicines to help with side effects. Follow all directions for their use. Call your doctor or health care professional for advice if you get a fever, chills or sore throat, or other symptoms of a cold or flu. Do not treat yourself. This drug decreases your body's ability to fight infections. Try to avoid being around people who are sick. This medicine may increase your risk to bruise or bleed. Call your doctor or health care professional if you notice any unusual bleeding. Talk to your doctor about your risk of cancer. You may be more at risk for certain types of cancers if you take this medicine. Do not become pregnant while taking this medicine or for 6 months after stopping it. Women should  inform their doctor if they wish to become pregnant or think they might be pregnant. Men should not father a child while taking this medicine and for 6 months after stopping it. There is a potential for serious side effects to an unborn child. Talk to your health care professional or pharmacist for more information. Do not breast-feed an infant while taking this medicine. This medicine has caused ovarian failure in some women and reduced sperm counts in some men This medicine may interfere with the ability to have a child. Talk with your doctor or health care professional if you are concerned about your fertility. This medicine may cause a decrease in Co-Enzyme Q-10. You should make sure that you get enough Co-Enzyme Q-10 while you are taking this medicine. Discuss the foods you eat and the vitamins you take with your health care professional. What side effects may I notice from  receiving this medicine? Side effects that you should report to your doctor or health care professional as soon as possible:  allergic reactions like skin rash, itching or hives, swelling of the face, lips, or tongue  breathing problems  chest pain  fast or irregular heartbeat  low blood counts - this medicine may decrease the number of white blood cells, red blood cells and platelets. You may be at increased risk for infections and bleeding.  pain, redness, or irritation at site where injected  signs of infection - fever or chills, cough, sore throat, pain or difficulty passing urine  signs of decreased platelets or bleeding - bruising, pinpoint red spots on the skin, black, tarry stools, blood in the urine  swelling of the ankles, feet, hands  tiredness  weakness Side effects that usually do not require medical attention (report to your doctor or health care professional if they continue or are bothersome):  diarrhea  hair loss  mouth sores  nail discoloration or damage  nausea  red colored urine  vomiting This list may not describe all possible side effects. Call your doctor for medical advice about side effects. You may report side effects to FDA at 1-800-FDA-1088. Where should I keep my medicine? This drug is given in a hospital or clinic and will not be stored at home. NOTE: This sheet is a summary. It may not cover all possible information. If you have questions about this medicine, talk to your doctor, pharmacist, or health care provider.  2020 [employer] Standard (2016-08-24 11:01:26)

## 2019-11-18 NOTE — Telephone Encounter (Signed)
Scheduled appts per treatment plan and appt conversions for mosaiq. Gave pt a print out of appt calendar.

## 2019-11-20 ENCOUNTER — Other Ambulatory Visit: Payer: Self-pay

## 2019-11-20 ENCOUNTER — Inpatient Hospital Stay: Payer: PPO

## 2019-11-20 VITALS — BP 124/61 | HR 88 | Temp 98.1°F | Resp 18 | Ht 62.0 in | Wt 207.8 lb

## 2019-11-20 DIAGNOSIS — F332 Major depressive disorder, recurrent severe without psychotic features: Secondary | ICD-10-CM | POA: Diagnosis not present

## 2019-11-20 DIAGNOSIS — Z483 Aftercare following surgery for neoplasm: Secondary | ICD-10-CM | POA: Diagnosis not present

## 2019-11-20 DIAGNOSIS — Z1501 Genetic susceptibility to malignant neoplasm of breast: Secondary | ICD-10-CM | POA: Diagnosis not present

## 2019-11-20 DIAGNOSIS — F419 Anxiety disorder, unspecified: Secondary | ICD-10-CM | POA: Diagnosis not present

## 2019-11-20 DIAGNOSIS — K2970 Gastritis, unspecified, without bleeding: Secondary | ICD-10-CM | POA: Diagnosis not present

## 2019-11-20 DIAGNOSIS — C50311 Malignant neoplasm of lower-inner quadrant of right female breast: Secondary | ICD-10-CM | POA: Diagnosis not present

## 2019-11-20 DIAGNOSIS — Z171 Estrogen receptor negative status [ER-]: Secondary | ICD-10-CM

## 2019-11-20 DIAGNOSIS — D508 Other iron deficiency anemias: Secondary | ICD-10-CM | POA: Diagnosis not present

## 2019-11-20 DIAGNOSIS — Z79891 Long term (current) use of opiate analgesic: Secondary | ICD-10-CM | POA: Diagnosis not present

## 2019-11-20 DIAGNOSIS — M1990 Unspecified osteoarthritis, unspecified site: Secondary | ICD-10-CM | POA: Diagnosis not present

## 2019-11-20 DIAGNOSIS — K219 Gastro-esophageal reflux disease without esophagitis: Secondary | ICD-10-CM | POA: Diagnosis not present

## 2019-11-20 DIAGNOSIS — G894 Chronic pain syndrome: Secondary | ICD-10-CM | POA: Diagnosis not present

## 2019-11-20 DIAGNOSIS — Z794 Long term (current) use of insulin: Secondary | ICD-10-CM | POA: Diagnosis not present

## 2019-11-20 DIAGNOSIS — E039 Hypothyroidism, unspecified: Secondary | ICD-10-CM | POA: Diagnosis not present

## 2019-11-20 DIAGNOSIS — Z4801 Encounter for change or removal of surgical wound dressing: Secondary | ICD-10-CM | POA: Diagnosis not present

## 2019-11-20 DIAGNOSIS — E1140 Type 2 diabetes mellitus with diabetic neuropathy, unspecified: Secondary | ICD-10-CM | POA: Diagnosis not present

## 2019-11-20 DIAGNOSIS — M545 Low back pain, unspecified: Secondary | ICD-10-CM | POA: Diagnosis not present

## 2019-11-20 DIAGNOSIS — E1121 Type 2 diabetes mellitus with diabetic nephropathy: Secondary | ICD-10-CM | POA: Diagnosis not present

## 2019-11-20 DIAGNOSIS — E782 Mixed hyperlipidemia: Secondary | ICD-10-CM | POA: Diagnosis not present

## 2019-11-20 DIAGNOSIS — I10 Essential (primary) hypertension: Secondary | ICD-10-CM | POA: Diagnosis not present

## 2019-11-20 DIAGNOSIS — Z6837 Body mass index (BMI) 37.0-37.9, adult: Secondary | ICD-10-CM | POA: Diagnosis not present

## 2019-11-20 DIAGNOSIS — D6949 Other primary thrombocytopenia: Secondary | ICD-10-CM | POA: Diagnosis not present

## 2019-11-20 DIAGNOSIS — Z5111 Encounter for antineoplastic chemotherapy: Secondary | ICD-10-CM | POA: Diagnosis not present

## 2019-11-20 DIAGNOSIS — Z6834 Body mass index (BMI) 34.0-34.9, adult: Secondary | ICD-10-CM | POA: Diagnosis not present

## 2019-11-20 MED ORDER — PEGFILGRASTIM-JMDB 6 MG/0.6ML ~~LOC~~ SOSY
PREFILLED_SYRINGE | SUBCUTANEOUS | Status: AC
  Filled 2019-11-20: qty 0.6

## 2019-11-20 MED ORDER — PEGFILGRASTIM-JMDB 6 MG/0.6ML ~~LOC~~ SOSY
6.0000 mg | PREFILLED_SYRINGE | Freq: Once | SUBCUTANEOUS | Status: AC
  Administered 2019-11-20: 6 mg via SUBCUTANEOUS

## 2019-11-20 NOTE — Patient Instructions (Signed)

## 2019-11-20 NOTE — Progress Notes (Signed)
PT STABLE AT TIME OF DISCHARGE.

## 2019-11-25 ENCOUNTER — Ambulatory Visit: Payer: PPO | Admitting: Family Medicine

## 2019-11-25 NOTE — Chronic Care Management (AMB) (Signed)
Chronic Care Management Pharmacy  Name: Linda [name]  MRN: [id number] DOB: 1970-05-13  Chief Complaint/ HPI  Linda [name],  49 y.o. , female presents for their Follow-Up CCM visit with the clinical pharmacist via telephone due to COVID-19 Pandemic.  PCP : [name], MD  Their chronic conditions include: HTN, Dyslipidemia, Hypothyroidism, BRCA 1 positive, HLD, Depression, Vitamin D insufficiency, GERD, Gastritis, Anxiety, DM.  Office Visits: 10/09/2019 - flu shot given. Consider reduction in diabetes medication in the future if A1C stays excellent.   Consult Visit: 11/18/2019 and 11/20/2019 - oncology visit for treatment.  11/15/2019 - Oncology visit.  11/04/2019 - General surgery post-op visit. Drains removed.  10/28/2019 - General surgery post-op visit.  10/01/2019 - General surgery initial consult for breast cancer.  Medications: Outpatient Encounter Medications as of 11/26/2019  Medication Sig Note  . Dulaglutide (TRULICITY) 7.06 CB/7.6EG SOPN INJECT ONE SYRINGE ONCE A WEEK   . ferrous sulfate 325 (65 FE) MG tablet Take 325 mg by mouth every evening.   . gabapentin (NEURONTIN) 300 MG capsule Take 1 capsule (300 mg total) by mouth 2 (two) times daily. (Patient taking differently: Take 300 mg by mouth 3 (three) times daily. )   . loratadine (CLARITIN) 10 MG tablet Take 10 mg by mouth daily. Taking 3-4 days each week due to bone pain associated with shot from cancer center.   . Blood Glucose Monitoring Suppl (ONETOUCH VERIO REFLECT) w/Device KIT AS DIRECTED   . Blood Glucose Monitoring Suppl (ONETOUCH VERIO REFLECT) w/Device KIT See admin instructions.   Marland Kitchen buPROPion (WELLBUTRIN XL) 300 MG 24 hr tablet Take 1 tablet (300 mg total) by mouth every evening.   . cetirizine (ZYRTEC) 10 MG tablet Take 10 mg by mouth daily as needed (seasonal allergies).  (Patient not taking: Reported on 11/26/2019)   . clotrimazole-betamethasone (LOTRISONE) cream Apply small amount to  affected area twice daily   . dicyclomine (BENTYL) 20 MG tablet TAKE ONE TABLET BY MOUTH BEFORE MEALS AND AT BEDTIME AS NEEDED FOR STOMACH CRAMPING   . famotidine (PEPCID) 20 MG tablet Take 1 tablet (20 mg total) by mouth 2 (two) times daily.   Marland Kitchen glucose blood test strip 1 each by Other route in the morning and at bedtime. One Touch Verio - Patient checks blood sugar 2-3 times daily.   . Insulin Pen Needle (BD PEN NEEDLE NANO U/F) 32G X 4 MM MISC Inject 1 each into the skin in the morning and at bedtime.   Marland Kitchen LANTUS SOLOSTAR 100 UNIT/ML Solostar Pen INJECT 68 UNITS SUBCUTANEOUSLY EVERYDAY AT BEDTIME   . Levomilnacipran HCl ER (FETZIMA) 80 MG CP24 Take 1 capsule by mouth daily.   Marland Kitchen levothyroxine (SYNTHROID) 75 MCG tablet Take 1 tablet (75 mcg total) by mouth daily.   Marland Kitchen LORazepam (ATIVAN) 0.5 MG tablet TAKE ONE TABLET BY MOUTH TWICE DAILY AS NEEDED FOR ANXIETY   . losartan (COZAAR) 50 MG tablet Take 1 tablet (50 mg total) by mouth every evening.   . metFORMIN (GLUCOPHAGE) 1000 MG tablet Take 1,000 mg by mouth 2 (two) times daily with a meal.   . methocarbamol (ROBAXIN) 500 MG tablet Take 1 tablet (500 mg total) by mouth 2 (two) times daily.   Marland Kitchen morphine (MS CONTIN) 30 MG 12 hr tablet Take 1 tablet (30 mg total) by mouth every 12 (twelve) hours.   . Multiple Vitamin (MULTIVITAMIN WITH MINERALS) TABS tablet Take 1 tablet by mouth daily. (Patient not taking: Reported on 07/25/2019) 06/08/2018: Pt  plans on purchasing again when she can get out.  . ondansetron (ZOFRAN) 4 MG tablet TAKE ONE TABLET BY MOUTH FOUR TIMES DAILY AS NEEDED FOR NAUSEA   . ondansetron (ZOFRAN-ODT) 4 MG disintegrating tablet Take 4 times a day as needed   . OneTouch Delica Lancets 25D MISC 1 each by Does not apply route daily before breakfast. Check blood sugar twice daily.   . pantoprazole (PROTONIX) 40 MG tablet Take 1 tablet (40 mg total) by mouth 2 (two) times daily.   . polyethylene glycol (MIRALAX / GLYCOLAX) 17 g packet Take 17 g  by mouth daily.   . potassium chloride (MICRO-K) 10 MEQ CR capsule Take 2 capsules (20 mEq total) by mouth 2 (two) times daily.   . pravastatin (PRAVACHOL) 40 MG tablet Take 1 tablet (40 mg total) by mouth daily.   . prochlorperazine (COMPAZINE) 10 MG tablet Take 10 mg by mouth every 6 (six) hours as needed for nausea or vomiting.   Marland Kitchen Specialty Vitamins Products (MAGNESIUM, AMINO ACID CHELATE,) 133 MG tablet Take 1 tablet by mouth at bedtime.   . sucralfate (CARAFATE) 1 g tablet Take 1 tablet (1 g total) by mouth 4 (four) times daily -  with meals and at bedtime.   Marland Kitchen SYNJARDY 12.05-998 MG TABS TAKE ONE TABLET BY MOUTH ONCE DAILY   . Vitamin D, Ergocalciferol, (DRISDOL) 1.25 MG (50000 UNIT) CAPS capsule Take 1 capsule (50,000 Units total) by mouth every Friday. Friday nights   . zolpidem (AMBIEN) 10 MG tablet Take 1 tablet (10 mg total) by mouth at bedtime as needed.    No facility-administered encounter medications on file as of 11/26/2019.   Allergies  Allergen Reactions  . Codeine Shortness Of Breath  . Celecoxib Other (See Comments)    Unknown reaction  . Ezetimibe-Simvastatin Other (See Comments)    Unknown reaction  . Propranolol Hcl Other (See Comments)    Unknown reaction   SDOH Screenings   Alcohol Screen:   . Last Alcohol Screening Score (AUDIT): Not on file  Depression (PHQ2-9): Medium Risk  . PHQ-2 Score: 21  Financial Resource Strain:   . Difficulty of Paying Living Expenses: Not on file  Food Insecurity: No Food Insecurity  . Worried About Charity fundraiser in the Last Year: Never true  . Ran Out of Food in the Last Year: Never true  Housing: Low Risk   . Last Housing Risk Score: 0  Physical Activity:   . Days of Exercise per Week: Not on file  . Minutes of Exercise per Session: Not on file  Social Connections:   . Frequency of Communication with Friends and Family: Not on file  . Frequency of Social Gatherings with Friends and Family: Not on file  . Attends  Religious Services: Not on file  . Active Member of Clubs or Organizations: Not on file  . Attends Archivist Meetings: Not on file  . Marital Status: Not on file  Stress:   . Feeling of Stress : Not on file  Tobacco Use: Low Risk   . Smoking Tobacco Use: Never Smoker  . Smokeless Tobacco Use: Never Used  Transportation Needs:   . Film/video editor (Medical): Not on file  . Lack of Transportation (Non-Medical): Not on file   Current Diagnosis/Assessment:  Goals Addressed            This Visit's Progress   . Pharmacy Care Plan       CARE PLAN ENTRY  Current Barriers:  . Chronic Disease Management support, education, and care coordination needs related to Hypertension, Hyperlipidemia, and Diabetes   Hypertension . Pharmacist Clinical Goal(s): o Over the next 90 days, patient will work with PharmD and providers to maintain BP goal <130/80 . Current regimen:  o Losartan 50 mg daily . Interventions: o Reviewed home blood pressure readings.  . Patient self care activities - Over the next 90 days, patient will: o Check BP monthly, document, and provide at future appointments o Ensure daily salt intake < 2300 mg/day  Hyperlipidemia . Pharmacist Clinical Goal(s): o Over the next 90 days, patient will work with PharmD and providers to achieve LDL goal < 100 . Current regimen:  o Pravastatin 20 mg daily in the evening.  . Interventions: o Reviewed recent lipid panel.  o Patient currently undergoing chemo/surgery recover and unable to exercise.  . Patient self care activities - Over the next 90 days, patient will: o Continue taking medication as directed.   Diabetes . Pharmacist Clinical Goal(s): o Over the next 90 days, patient will work with PharmD and providers to maintain A1c goal <7% . Current regimen:  o Trulicity 3.79 mg weekly o Synjardy 12.05-998 mg daily o Lantus 68 units daily . Interventions: o Discussed elevated blood sugar readings with  steroids surrounding chemotherapy.  . Patient self care activities - Over the next 90 days, patient will: o Check blood sugar twice daily, document, and provide at future appointments o Contact provider with any episodes of hypoglycemia  Medication management . Pharmacist Clinical Goal(s): o Over the next 90  days, patient will work with PharmD and providers to maintain optimal medication adherence . Current pharmacy: [facility] . Interventions o Comprehensive medication review performed. o Utilize [facility] for medication synchronization, packaging and delivery . Patient self care activities - Over the next 90 days, patient will: o Focus on medication adherence by utilizing pharmacy delivery o Take medications as prescribed o Report any questions or concerns to PharmD and/or provider(s)  Please see past updates related to this goal by clicking on the "Past Updates" button in the selected goal        Hyperlipidemia   Lipid Panel     Component Value Date/Time   CHOL 181 10/09/2019 0947   TRIG 146 10/09/2019 0947   HDL 43 10/09/2019 0947   [place] 112 (H) 10/09/2019 0947     The 10-year ASCVD risk score Mikey Bussing DC Jr., et al., 2013) is: 3.9%   Values used to calculate the score:     Age: 32 years     Sex: Female     Is Non-Hispanic African American: No     Diabetic: Yes     Tobacco smoker: No     Systolic Blood Pressure: 024 mmHg     Is BP treated: Yes     HDL Cholesterol: 43 mg/dL     Total Cholesterol: 181 mg/dL   Patient has failed these meds in past: n/a Patient is currently controlled on the following medications:  . Pravastatin 20 mg daily qhs  We discussed:  diet and exercise extensively. Patient's appetite fluctuates a lot with chemo right now. Unable to exercise at this time. Consider increasing dose after chemo to achieve goal LDL.   Plan  Continue current medications   Diabetes   Recent Relevant Labs: Lab Results  Component Value  Date/Time   HGBA1C 6.9 (H) 10/09/2019 09:47 AM   HGBA1C 6.2 (H) 07/08/2019 12:00 AM   MICROALBUR  30 10/09/2019 11:56 AM   MICROALBUR 80 07/08/2019 09:51 AM    Lab Results  Component Value Date   CREATININE 0.8 11/15/2019   BUN 11 11/15/2019   GFRNONAA 63 10/09/2019   GFRAA 73 10/09/2019   NA 140 11/15/2019   K 3.8 11/15/2019   CALCIUM 9.2 11/15/2019   CO2 24 (A) 11/15/2019     Checking BG: Daily  Recent FBG Readings: has not been checking.  Recent HS BG readings: 156 Patient has failed these meds in past: metformin Patient is currently controlled on the following medications:   synjardy 12.05-998 mg daily  lantus 68 units qhs  trulicity 9.44 mg weekly  Last diabetic Foot exam: March 2021 Last diabetic Eye exam: 12/17/2018   We discussed: diet and exercise extensively  Steroids around chemo treatment are making her blood sugar very high. She reports highest recent was 299 mg/dL. Some days she has no appetite and has mouth sores from chemo that makes it hard to eat sometimes. Other days steroids are making her hungry at times as well where she will experience "waves of food attacks". She is very concerned about elevated blood sugar readings. She sees Dr. Hinton Rao tomorrow and will let pharmacist know result of visit. Pharmacist consulting with Dr. Tobie Poet on elevated blood sugar readings.   Plan  Consult with Dr. Tobie Poet to address elevated blood sugar readings.   Hypothyroidism   Lab Results  Component Value Date/Time   TSH 1.450 07/08/2019 12:00 AM   TSH 2.070 04/03/2019 11:36 AM    Patient has failed these meds in past: n/a Patient is currently controlled on the following medications:  . Levothyroxine 75 mcg daily   We discussed: Patient takes levothyroxine each morning. She denies missed doses.   Plan  Continue current medications   Hypertension   BP today is:  <140/90  Office blood pressures are  BP Readings from Last 3 Encounters:  11/20/19 124/61   11/18/19 122/63  10/09/19 106/60    Patient has failed these meds in the past: lisinopril 10 mg daily Patient is currently controlled on the following medications:   losartan 50 mg daily Patient checks BP at home daily  Patient home BP readings are ranging: 120/60-70  mmHg  We discussed diet and exercise extensively. Checking blood pressure at home now. States well controlled currently.   Plan  Continue current medications     and  Other Diagnosis: Chronic Pain Syndrome   Patient has failed these meds in past: naproxen,  Patient is currently controlled on the following medications:   morphine er 30 mg bid  gabapentin 300 mg tid  Methocarbamol 500 mg bid  We discussed: Patient has had bad symptoms with restless leg and now increased her gabapentin for neuropathy to three times daily. Reports sore legs since beginning Chemo. Patient will discuss with Dr. Hinton Rao tomorrow.  Plan  Continue current medications  Depression   Patient has failed these meds in past: n/a Patient is currently controlled on the following medications:  . Bupropion xl 300 mg daily . Fetzima 80 mg daily  . Lorazepam 0.5 mg bid prn anxiety   We discussed: Patient reports increased anxiety lately. She is concerned with how she will clean her home or take care of normal duties. She reports a custodial care benefit under her insurance but denied agency in her area. Reports depression is under control overall but anxiety has increased lately. Patient reports using lorazepam prn since surgery/chemo. She would like something less sedating  during the day. Discussed buspirone as an option for anxiety. Patient will discuss with Dr. Hinton Rao tomorrow and call pharmacist back with any questions or concerns.    Plan  Continue current medications  GERD/Gastritis   Patient has failed these meds in past: n/a Patient is currently controlled on the following medications:  . Dicyclomine 20 mg qid prn for  spasms . Famotidine 20 mg bid . Zofran 4 mg qid prn - nausea . Pantoprazole 40 mg bid  . Sucralfate 1 gm qid . Prochlorperazine 10 mg q6h prn nausea/vomiting . Miralax prn constipation  We discussed: Patient sees Dr. Melina Copa for stomach spasms/issues. Patient struggling with nausea with treatment. Oncology has added prochlorperazine bid to help with nausea. Patient is experiencing more constipation since surgery/chemo. States current regimen is helping manage.   Plan  Continue current medications  Health Maintenance   Patient is currently controlled on the following medications:  Marland Kitchen Zolpidem 10 mg daily - insomnia . Magnesium daily at bedtime- insomnia . Multivitamin daily - general health . Potassium chloride 10 meq bid - Low potassium . Vitamin D 50,000 IU weekly - Low vitamin D . Ferrous sulfate 325 mg daily  . Clotrimazole-betamethasone bid - rash under breast   We discussed:  Recommend patient begin eating with ferrous sulfate.   Plan  Continue current medications  Vaccines   Reviewed and discussed patient's vaccination history. Has had both [place]. Patient checking with Dr. Hinton Rao to see recommendation on third shot during chemo.   Immunization History  Administered Date(s) Administered  . Hepatitis B 07/10/2019  . Influenza Inj Mdck Quad Pf 10/09/2019  . Influenza-Unspecified 09/14/2018  . PFIZER SARS-COV-2 Vaccination 04/18/2019, 05/13/2019    Plan  Recommended patient receive annual flu vaccine in office.   Medication Management   Pt uses [facility] for all medications Uses pill box? Yes Pt endorses decent compliance  We discussed: Verbal consent obtained for [facility] enhanced pharmacy services (medication synchronization, adherence packaging, delivery coordination). A medication sync plan was created to allow patient to get all medications delivered once every 30 to 90 days per patient preference. Patient understands they  have freedom to choose pharmacy and clinical pharmacist will coordinate care between all prescribers and [facility].   Plan  Utilize [facility] for medication synchronization, packaging and delivery    Follow up: 3 month phone visit

## 2019-11-26 ENCOUNTER — Ambulatory Visit: Payer: PPO

## 2019-11-26 ENCOUNTER — Other Ambulatory Visit: Payer: Self-pay

## 2019-11-26 DIAGNOSIS — E782 Mixed hyperlipidemia: Secondary | ICD-10-CM

## 2019-11-26 DIAGNOSIS — I10 Essential (primary) hypertension: Secondary | ICD-10-CM

## 2019-11-26 NOTE — Progress Notes (Signed)
PT STABLE AT TIME OF DISCHARGE

## 2019-11-26 NOTE — Patient Instructions (Addendum)
Visit Information  Goals Addressed            This Visit's Progress   . Pharmacy Care Plan       CARE PLAN ENTRY  Current Barriers:  . Chronic Disease Management support, education, and care coordination needs related to Hypertension, Hyperlipidemia, and Diabetes   Hypertension . Pharmacist Clinical Goal(s): o Over the next 90 days, patient will work with PharmD and providers to maintain BP goal <130/80 . Current regimen:  o Losartan 50 mg daily . Interventions: o Reviewed home blood pressure readings.  . Patient self care activities - Over the next 90 days, patient will: o Check BP monthly, document, and provide at future appointments o Ensure daily salt intake < 2300 mg/day  Hyperlipidemia . Pharmacist Clinical Goal(s): o Over the next 90 days, patient will work with PharmD and providers to achieve LDL goal < 100 . Current regimen:  o Pravastatin 20 mg daily in the evening.  . Interventions: o Reviewed recent lipid panel.  o Patient currently undergoing chemo/surgery recover and unable to exercise.  . Patient self care activities - Over the next 90 days, patient will: o Continue taking medication as directed.   Diabetes . Pharmacist Clinical Goal(s): o Over the next 90 days, patient will work with PharmD and providers to maintain A1c goal <7% . Current regimen:  o Trulicity 6.38 mg weekly o Synjardy 12.05-998 mg daily o Lantus 68 units daily . Interventions: o Discussed elevated blood sugar readings with steroids surrounding chemotherapy.  . Patient self care activities - Over the next 90 days, patient will: o Check blood sugar twice daily, document, and provide at future appointments o Contact provider with any episodes of hypoglycemia  Medication management . Pharmacist Clinical Goal(s): o Over the next 90  days, patient will work with PharmD and providers to maintain optimal medication adherence . Current pharmacy: [facility] . Interventions o Comprehensive medication review performed. o Utilize [facility] for medication synchronization, packaging and delivery . Patient self care activities - Over the next 90 days, patient will: o Focus on medication adherence by utilizing pharmacy delivery o Take medications as prescribed o Report any questions or concerns to PharmD and/or provider(s)  Please see past updates related to this goal by clicking on the "Past Updates" button in the selected goal         Patient verbalizes understanding of instructions provided today.   Telephone follow up appointment with pharmacy team member scheduled for:  Sherre Poot, PharmD, [facility] Clinical Pharmacist [name] [phone] (office) [phone] (mobile)   Cooking With Less Salt Cooking with less salt is one way to reduce the amount of sodium you get from food. Depending on your condition and overall health, your health care provider or diet and nutrition specialist (dietitian) may recommend that you reduce your sodium intake. Most people should have less than 2,300 milligrams (mg) of sodium each day. If you have high blood pressure (hypertension), you may need to limit your sodium to 1,500 mg each day. Follow the tips below to help reduce your sodium intake. What do I need to know about cooking with less salt? Shopping  Buy sodium-free or low-sodium products. Look for the following words on food labels: ? Low-sodium. ? Sodium-free. ? Reduced-sodium. ? No salt added. ? Unsalted.  Buy fresh or frozen vegetables. Avoid canned vegetables.  Avoid buying meats or protein foods that have been injected with broth or saline solution.  Avoid cured or smoked  meats, such as hot dogs, bacon, salami, ham, and bologna. Reading food labels   Check the food label before buying or using packaged ingredients.  Look for products with no more than 140 mg of sodium in one serving.  Do not  choose foods with salt as one of the first three ingredients on the ingredients list. If salt is one of the first three ingredients, it usually means the item is high in sodium, because ingredients are listed in order of amount in the food item. Cooking  Use herbs, seasonings without salt, and spices as substitutes for salt in foods.  Use sodium-free baking soda when baking.  Grill, braise, or roast foods to add flavor with less salt.  Avoid adding salt to pasta, rice, or hot cereals while cooking.  Drain and rinse canned vegetables before use.  Avoid adding salt when cooking sweets and desserts.  Cook with low-sodium ingredients. What are some salt alternatives? The following are herbs, seasonings, and spices that can be used instead of salt to give taste to your food. Herbs should be fresh or dried. Do not choose packaged mixes. Next to the name of the herb, spice, or seasoning are some examples of foods you can pair it with. Herbs  Bay leaves - Soups, meat and vegetable dishes, and spaghetti sauce.  Basil - [employer], soups, pasta, and fish dishes.  Cilantro - Meat, poultry, and vegetable dishes.  Chili powder - Marinades and Mexican dishes.  Chives - Salad dressings and potato dishes.  Cumin - Mexican dishes, couscous, and meat dishes.  Dill - Fish dishes, sauces, and salads.  Fennel - Meat and vegetable dishes, breads, and cookies.  Garlic (do not use garlic salt) - [place] dishes, meat dishes, salad dressings, and sauces.  Marjoram - Soups, potato dishes, and meat dishes.  Oregano - Pizza and spaghetti sauce.  Parsley - Salads, soups, pasta, and meat dishes.  Rosemary - [place] dishes, salad dressings, soups, and red meats.  Saffron - Fish dishes, pasta, and some poultry dishes.  Sage - Stuffings and sauces.  Tarragon - Fish and [employer].  Thyme - Stuffing, meat, and fish dishes. Seasonings  Lemon juice - Fish dishes, poultry dishes, vegetables,  and salads.  Vinegar - Salad dressings, vegetables, and fish dishes. Spices  Cinnamon - Sweet dishes, such as cakes, cookies, and puddings.  Cloves - Gingerbread, puddings, and marinades for meats.  Curry - Vegetable dishes, fish and poultry dishes, and stir-fry dishes.  Ginger - Vegetables dishes, fish dishes, and stir-fry dishes.  Nutmeg - Pasta, vegetables, poultry, fish dishes, and custard. What are some low-sodium ingredients and foods?  Fresh or frozen fruits and vegetables with no sauce added.  Fresh or frozen whole meats, poultry, and fish with no sauce added.  Eggs.  Noodles, pasta, quinoa, rice.  Shredded or puffed wheat or puffed rice.  Regular or quick oats.  Milk, yogurt, hard cheeses, and low-sodium cheeses. Good cheese choices include Swiss, [place]. Always check the label for the serving size and sodium content.  Unsalted butter or margarine.  Unsalted nuts.  Sherbet or ice cream (keep to  cup per serving).  Homemade pudding.  Sodium-free baking soda and baking powder. This is not a complete list of low-sodium ingredients and foods. Contact your dietitian for more options. Summary  Cooking with less salt is one way to reduce the amount of sodium that you get from food.  Buy sodium-free or low-sodium products.  Check the food  label before using or buying packaged ingredients.  Use herbs, seasonings without salt, and spices as substitutes for salt in foods. This information is not intended to replace advice given to you by your health care provider. Make sure you discuss any questions you have with your health care provider. Document Revised: 12/23/2016 Document Reviewed: 01/19/2016 Elsevier Patient Education  2020 [employer].

## 2019-11-27 ENCOUNTER — Inpatient Hospital Stay: Payer: PPO | Attending: Oncology | Admitting: Oncology

## 2019-11-27 ENCOUNTER — Inpatient Hospital Stay: Payer: PPO

## 2019-11-27 ENCOUNTER — Telehealth: Payer: Self-pay

## 2019-11-27 ENCOUNTER — Encounter: Payer: Self-pay | Admitting: Oncology

## 2019-11-27 ENCOUNTER — Other Ambulatory Visit: Payer: Self-pay

## 2019-11-27 VITALS — BP 139/65 | HR 100 | Temp 98.6°F | Resp 18 | Ht 62.0 in | Wt 212.2 lb

## 2019-11-27 DIAGNOSIS — M79662 Pain in left lower leg: Secondary | ICD-10-CM

## 2019-11-27 DIAGNOSIS — Z79899 Other long term (current) drug therapy: Secondary | ICD-10-CM | POA: Insufficient documentation

## 2019-11-27 DIAGNOSIS — C50311 Malignant neoplasm of lower-inner quadrant of right female breast: Secondary | ICD-10-CM

## 2019-11-27 DIAGNOSIS — Z5189 Encounter for other specified aftercare: Secondary | ICD-10-CM | POA: Diagnosis not present

## 2019-11-27 DIAGNOSIS — C50811 Malignant neoplasm of overlapping sites of right female breast: Secondary | ICD-10-CM | POA: Diagnosis not present

## 2019-11-27 DIAGNOSIS — G2581 Restless legs syndrome: Secondary | ICD-10-CM | POA: Diagnosis not present

## 2019-11-27 DIAGNOSIS — Z9013 Acquired absence of bilateral breasts and nipples: Secondary | ICD-10-CM | POA: Diagnosis not present

## 2019-11-27 DIAGNOSIS — K7460 Unspecified cirrhosis of liver: Secondary | ICD-10-CM

## 2019-11-27 DIAGNOSIS — Z5111 Encounter for antineoplastic chemotherapy: Secondary | ICD-10-CM | POA: Diagnosis not present

## 2019-11-27 DIAGNOSIS — D696 Thrombocytopenia, unspecified: Secondary | ICD-10-CM

## 2019-11-27 DIAGNOSIS — Z90710 Acquired absence of both cervix and uterus: Secondary | ICD-10-CM

## 2019-11-27 DIAGNOSIS — M79661 Pain in right lower leg: Secondary | ICD-10-CM | POA: Diagnosis not present

## 2019-11-27 DIAGNOSIS — M549 Dorsalgia, unspecified: Secondary | ICD-10-CM

## 2019-11-27 DIAGNOSIS — Z90722 Acquired absence of ovaries, bilateral: Secondary | ICD-10-CM

## 2019-11-27 DIAGNOSIS — Z1501 Genetic susceptibility to malignant neoplasm of breast: Secondary | ICD-10-CM | POA: Diagnosis not present

## 2019-11-27 DIAGNOSIS — Z171 Estrogen receptor negative status [ER-]: Secondary | ICD-10-CM

## 2019-11-27 DIAGNOSIS — Z9079 Acquired absence of other genital organ(s): Secondary | ICD-10-CM

## 2019-11-27 DIAGNOSIS — D225 Melanocytic nevi of trunk: Secondary | ICD-10-CM

## 2019-11-27 LAB — CBC WITH DIFFERENTIAL (CANCER CENTER ONLY)
Abs Immature Granulocytes: 0.04 10*3/uL (ref 0.00–0.07)
Basophils Absolute: 0 10*3/uL (ref 0.0–0.1)
Basophils Relative: 2 %
Eosinophils Absolute: 0 10*3/uL (ref 0.0–0.5)
Eosinophils Relative: 3 %
HCT: 32.9 % — ABNORMAL LOW (ref 36.0–46.0)
Hemoglobin: 9.8 g/dL — ABNORMAL LOW (ref 12.0–15.0)
Immature Granulocytes: 4 %
Lymphocytes Relative: 56 %
Lymphs Abs: 0.5 10*3/uL — ABNORMAL LOW (ref 0.7–4.0)
MCH: 26.6 pg (ref 26.0–34.0)
MCHC: 29.8 g/dL — ABNORMAL LOW (ref 30.0–36.0)
MCV: 89.2 fL (ref 80.0–100.0)
Monocytes Absolute: 0.1 10*3/uL (ref 0.1–1.0)
Monocytes Relative: 13 %
Neutro Abs: 0.2 10*3/uL — CL (ref 1.7–7.7)
Neutrophils Relative %: 22 %
Platelet Count: 36 10*3/uL — ABNORMAL LOW (ref 150–400)
RBC: 3.69 MIL/uL — ABNORMAL LOW (ref 3.87–5.11)
RDW: 13.8 % (ref 11.5–15.5)
WBC Count: 0.9 10*3/uL — CL (ref 4.0–10.5)
nRBC: 0 % (ref 0.0–0.2)

## 2019-11-27 LAB — CMP (CANCER CENTER ONLY)
ALT: 28 U/L (ref 0–44)
AST: 26 U/L (ref 15–41)
Albumin: 3.4 g/dL — ABNORMAL LOW (ref 3.5–5.0)
Alkaline Phosphatase: 62 U/L (ref 38–126)
Anion gap: 9 (ref 5–15)
BUN: 8 mg/dL (ref 6–20)
CO2: 23 mmol/L (ref 22–32)
Calcium: 8.6 mg/dL — ABNORMAL LOW (ref 8.9–10.3)
Chloride: 107 mmol/L (ref 98–111)
Creatinine: 0.88 mg/dL (ref 0.44–1.00)
GFR, Estimated: >60 mL/min (ref >60)
Glucose, Bld: 134 mg/dL — ABNORMAL HIGH (ref 70–99)
Potassium: 3.6 mmol/L (ref 3.5–5.1)
Sodium: 139 mmol/L (ref 135–145)
Total Bilirubin: 0.8 mg/dL (ref 0.3–1.2)
Total Protein: 6.4 g/dL — ABNORMAL LOW (ref 6.5–8.1)

## 2019-11-27 NOTE — Progress Notes (Signed)
[facility]  [address] [place],  [place]  [address] [phone]  Clinic Day:  11/27/2019  Referring physician: [name], MD   CHIEF COMPLAINT:  CC: Breast cancer  Current Treatment:  AC for 4 doses to be followed by weekly Taxol for 12 doses   HISTORY OF PRESENT ILLNESS:  Linda [name] is a 49 y.o. female who we began seeing in February 2018 for evaluation of anemia.  Her anemia was felt to be secondary to iron deficiency and we recommended continuation of oral iron supplementation for a total of 6 months, as well as referral back to the gastroenterologist.  The patient also has a history of thrombocytopenia and had previously seen Dr. Bobby Rumpf in 2014.  The thrombocytopenia was felt to be secondary to mild chronic immune thrombocytopenic purpura (ITP).  Due to the patient's family history of malignancy, she underwent testing for hereditary cancer syndromes with the [employer] myRisk Hereditary Cancer Panel test.  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  She underwent a robotic hysterectomy and bilateral salpingo-oophorectomy in May 2018 with Dr. Everitt Amber.  Pathology was benign.  We also discussed the option of risk reducing bilateral mastectomy, but she has chosen to have close surveillance, so I recommend MRI scans of the breast in addition to and will mammography.  She was placed on chemoprevention with raloxifene in September 2018.  CT abdomen and pelvis from August 6th confirmed a tiny left renal calculus, as well as probable hepatic cirrhosis without evidence of hepatic mass.  She was then referred to [place] and is followed by Roosevelt Locks, CRNP in [place] for her liver cirrhosis.  She tested negative for hepatitis A, B, and C.  She has received her hepatitis vaccines in June and July  and will be due for the 3rd one in December.  We saw her in September 2021 for a new diagnosis of breast cancer, and to discuss recent pathology results.  She underwent screening bilateral mammogram on August 3rd which revealed possible masses in the right breast.  Diagnostic right mammogram and right breast ultrasound from August 18th confirmed suspicious masses in the right breast at 5 o'clock measuring 1.5 cm and 6 o'clock measuring 1.4 cm.  There was an indeterminate 4 mm with possible distortion in the outer right breast without sonographic correlate.  Right axillary ultrasound was normal.  She then underwent biopsies of both masses and surgical pathology from these procedures revealed  invasive ductal carcinoma, grade 3, with necrosis at 5 o'clock; and invasive ductal carcinoma, grade 1-2, with necrosis and focal myxoid change at 6 o'clock.  No DCIS was identified.  Breast prognostic profiles revealed HER2, and estrogen and progesterone receptors to be negative. Ki67 was 30% at the 5 o'clock mass, and 40% at 6 o'clock.  She underwent bilateral mastectomies on September 24th with Dr. Noberto Retort, and surgical pathology from this procedure revealed invasive ductal carcinoma, grade 3, with myxoid change, 19 mm, at 6 o'clock, and invasive ductal carcinoma, grade 3, and ductal carcinoma in situ, 19 mm, at 5 o'clock.  All margins were negative for invasive carcinoma or DCIS.  Two sentinel lymph nodes were negative for metastatic carcinoma (0/2), staging this as a ([id number]).  CT chest, abdomen and pelvis from September revealed no findings of metastatic disease.    INTERVAL HISTORY:  Linda [name]  is here for follow up and reports severe back pain and bilateral calf pain which she rates as a 7/10.   She started chemotherapy with [facility] on October 25th, and her pain started 5 days following.  Methocarbimol has been ineffective.  Her blood glucose has also not been well controlled.  The likely culprit for her pain would be the  steroid premedication, so at this time, we will discontinue her steroid premedication to see if this improves her symptoms.  She has Ativan 0.5 mg for her anxiety, but this makes her feel drowsy.  I advise that she try a half dose of Ativan for her anxiety as she does not require this regularly.  Her appetite is good, and she has gained 1 pound since her last visit.  She denies fever, chills or other signs of infection.  She denies nausea, vomiting, bowel issues, or abdominal pain.  She denies sore throat, cough, dyspnea, or chest pain.    REVIEW OF SYSTEMS:  Review of Systems  HENT:   Positive for mouth sores (secondary to chemotherapy, mild).   Musculoskeletal: Positive for back pain.       Bilateral calf pain and tightness  Psychiatric/Behavioral: The patient is nervous/anxious.   All other systems reviewed and are negative.    VITALS:  Blood pressure 139/65, pulse 100, temperature 98.6 F (37 C), resp. rate 18, height _0  (1.575 m), weight 212 lb 3.2 oz (96.3 kg), last menstrual period 06/13/2009, SpO2 97 %.  Wt Readings from Last 3 Encounters:  11/27/19 212 lb 3.2 oz (96.3 kg)  11/26/19 207 lb 3 oz (94 kg)  11/26/19 207 lb 3 oz (94 kg)    Body mass index is 38.81 kg/m.  Performance status (ECOG): 1 - Symptomatic but completely ambulatory  PHYSICAL EXAM:  Physical Exam Constitutional:      Appearance: Normal appearance. She is normal weight.  HENT:     Head: Normocephalic and atraumatic.  Eyes:     General: No scleral icterus.    Extraocular Movements: Extraocular movements intact.     Conjunctiva/sclera: Conjunctivae normal.     Pupils: Pupils are equal, round, and reactive to light.  Cardiovascular:     Rate and Rhythm: Normal rate and regular rhythm.     Pulses: Normal pulses.     Heart sounds: Normal heart sounds. No murmur heard.  No friction rub. No gallop.   Pulmonary:     Effort: Pulmonary effort is normal. No respiratory distress.     Breath sounds: Normal  breath sounds.  Abdominal:     General: Bowel sounds are normal. There is no distension.     Palpations: Abdomen is soft. There is no mass.     Tenderness: There is no abdominal tenderness.  Musculoskeletal:     Cervical back: Normal range of motion and neck supple.     Right lower leg: Edema (mild) present.     Left lower leg: Edema (mild) present.     Comments: Increased muscle tone of bilateral calves.  Lymphadenopathy:     Cervical: No cervical adenopathy.  Skin:    General: Skin is warm and dry.  Neurological:     General: No focal deficit present.     Mental Status: She is alert and oriented to person, place, and time. Mental status is at baseline.     LABS:   CBC Latest Ref Rng & Units 11/27/2019 11/15/2019 10/09/2019  WBC 4.0 - 10.5 K/uL 0.9(LL) 3.7 7.5  Hemoglobin 12.0 -  15.0 g/dL 9.8(L) 11.1(A) 12.8  Hematocrit 36 - 46 % 32.9(L) 35(A) 39.8  Platelets 150 - 400 K/uL 36(L) 90(A) 132(L)   CMP Latest Ref Rng & Units 11/27/2019 11/15/2019 10/09/2019  Glucose 70 - 99 mg/dL 134(H) - 123(H)  BUN 6 - 20 mg/dL _0 Creatinine 0.44 - 1.00 mg/dL 0.88 0.8 1.03(H)  Sodium 135 - 145 mmol/L 139 140 140  Potassium 3.5 - 5.1 mmol/L 3.6 3.8 4.3  Chloride 98 - 111 mmol/L 107 105 102  CO2 22 - 32 mmol/L 23 24(A) 23  Calcium 8.9 - 10.3 mg/dL 8.6(L) 9.2 9.5  Total Protein 6.5 - 8.1 g/dL 6.4(L) - 6.8  Total Bilirubin 0.3 - 1.2 mg/dL 0.8 - 0.4  Alkaline Phos 38 - 126 U/L 62 92 104  AST 15 - 41 U/L 26 40(A) 19  ALT 0 - 44 U/L _1 STUDIES:  No results found.   Allergies:  Allergies  Allergen Reactions  . Codeine Shortness Of Breath  . Celecoxib Other (See Comments)    Unknown reaction  . Ezetimibe-Simvastatin Other (See Comments)    Unknown reaction  . Propranolol Hcl Other (See Comments)    Unknown reaction    Current Medications: Current Outpatient Medications  Medication Sig Dispense Refill  . Blood Glucose Monitoring Suppl (ONETOUCH VERIO REFLECT) w/Device KIT  AS DIRECTED    . Blood Glucose Monitoring Suppl (ONETOUCH VERIO REFLECT) w/Device KIT See admin instructions.    Marland Kitchen buPROPion (WELLBUTRIN XL) 300 MG 24 hr tablet Take 1 tablet (300 mg total) by mouth every evening. 90 tablet 1  . calcium citrate-vitamin D (CITRACAL+D) 315-200 MG-UNIT tablet Take 1 tablet by mouth 2 (two) times daily.    . cetirizine (ZYRTEC) 10 MG tablet Take 10 mg by mouth daily as needed (seasonal allergies).     . clotrimazole-betamethasone (LOTRISONE) cream Apply small amount to affected area twice daily (Patient taking differently: as needed. Apply small amount to affected area twice daily) 45 g 0  . dicyclomine (BENTYL) 20 MG tablet TAKE ONE TABLET BY MOUTH BEFORE MEALS AND AT BEDTIME AS NEEDED FOR STOMACH CRAMPING 180 tablet 1  . Dulaglutide (TRULICITY) 8.10 FB/5.1WC SOPN INJECT ONE SYRINGE ONCE A WEEK 6 mL 1  . famotidine (PEPCID) 20 MG tablet Take 1 tablet (20 mg total) by mouth 2 (two) times daily. 180 tablet 1  . ferrous sulfate 325 (65 FE) MG tablet Take 325 mg by mouth every evening.    . gabapentin (NEURONTIN) 300 MG capsule Take 1 capsule (300 mg total) by mouth 2 (two) times daily. (Patient taking differently: Take 300 mg by mouth 3 (three) times daily. ) 180 capsule 1  . glucose blood test strip 1 each by Other route in the morning and at bedtime. One Touch Verio - Patient checks blood sugar 2-3 times daily. 100 each 2  . Insulin Pen Needle (BD PEN NEEDLE NANO U/F) 32G X 4 MM MISC Inject 1 each into the skin in the morning and at bedtime. 200 each 3  . LANTUS SOLOSTAR 100 UNIT/ML Solostar Pen INJECT 68 UNITS SUBCUTANEOUSLY EVERYDAY AT BEDTIME 15 mL 2  . Levomilnacipran HCl ER (FETZIMA) 80 MG CP24 Take 1 capsule by mouth daily. 90 capsule 1  . levothyroxine (SYNTHROID) 75 MCG tablet Take 1 tablet (75 mcg total) by mouth daily. 90 tablet 1  . loratadine (CLARITIN) 10 MG tablet Take 10 mg by mouth daily. Taking 3-4 days each week due to bone  pain associated with shot  from cancer center.    Marland Kitchen LORazepam (ATIVAN) 0.5 MG tablet TAKE ONE TABLET BY MOUTH TWICE DAILY AS NEEDED FOR ANXIETY 30 tablet 2  . losartan (COZAAR) 50 MG tablet Take 1 tablet (50 mg total) by mouth every evening. 90 tablet 1  . methocarbamol (ROBAXIN) 500 MG tablet Take 1 tablet (500 mg total) by mouth 2 (two) times daily. 180 tablet 1  . morphine (MS CONTIN) 30 MG 12 hr tablet Take 1 tablet (30 mg total) by mouth every 12 (twelve) hours. 60 tablet 0  . Multiple Vitamin (MULTIVITAMIN WITH MINERALS) TABS tablet Take 1 tablet by mouth daily.     . ondansetron (ZOFRAN) 4 MG tablet TAKE ONE TABLET BY MOUTH FOUR TIMES DAILY AS NEEDED FOR NAUSEA 30 tablet 1  . ondansetron (ZOFRAN-ODT) 4 MG disintegrating tablet Take 4 times a day as needed    . OneTouch Delica Lancets 29N MISC 1 each by Does not apply route daily before breakfast. Check blood sugar twice daily. 100 each 3  . pantoprazole (PROTONIX) 40 MG tablet Take 1 tablet (40 mg total) by mouth 2 (two) times daily. 180 tablet 1  . polyethylene glycol (MIRALAX / GLYCOLAX) 17 g packet Take 17 g by mouth daily.    . potassium chloride (MICRO-K) 10 MEQ CR capsule Take 2 capsules (20 mEq total) by mouth 2 (two) times daily. 180 capsule 1  . pravastatin (PRAVACHOL) 40 MG tablet Take 1 tablet (40 mg total) by mouth daily. 90 tablet 0  . prochlorperazine (COMPAZINE) 10 MG tablet Take 10 mg by mouth every 6 (six) hours as needed for nausea or vomiting.    Marland Kitchen Specialty Vitamins Products (MAGNESIUM, AMINO ACID CHELATE,) 133 MG tablet Take 1 tablet by mouth at bedtime.    . sucralfate (CARAFATE) 1 g tablet Take 1 tablet (1 g total) by mouth 4 (four) times daily -  with meals and at bedtime. 270 tablet 1  . SYNJARDY 12.05-998 MG TABS TAKE ONE TABLET BY MOUTH ONCE DAILY 90 tablet 0  . Vitamin D, Ergocalciferol, (DRISDOL) 1.25 MG (50000 UNIT) CAPS capsule Take 1 capsule (50,000 Units total) by mouth every Friday. Friday nights 12 capsule 1  . zolpidem (AMBIEN) 10  MG tablet Take 1 tablet (10 mg total) by mouth at bedtime as needed. 30 tablet 0   No current facility-administered medications for this visit.     ASSESSMENT & PLAN:   Assessment:   1. Positive BRCA 1 mutation, which increases her risk for breast and ovarian cancer, as well as elevates her risk for pancreatic cancer, for which she underwent hysterectomy/bilateral salpingo-oophorectomy and is on chemoprevention with raloxifene and regular surveillance.   She declined bilateral prophylactic mastectomies.   2.  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  She has undergone mastectomy and is healing well, and we discussed treatment options today.  3.  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  I think this is a separate primary.  She has undergone mastectomy and is healing well.  As above, we discussed treatment options today.  4. Thrombocytopenia which is felt to be due to chronic ITP.  5. Liver cirrhosis as seen on CT imaging in August 2020.  She has been referred and is  being seen by Roosevelt Locks, CRNP, at the [facility] in [place].  Hepatitis panel was negative, and she has received the appropriate vaccines.  Abdominal ultrasound from April was stable and CT imaging from September was stable.    6. Nevi of the back which need to be monitored.  7.  Severe back and bilateral leg pain associated with restless legs.  This started about 5 days following her first treatment, and I feel is secondary to her steroid premedication.  We will discontinue oral steroids at this time and decrease the dose of IV at the time of her treatment to see if her symptoms improve.  I will send in a prescription for Flexeril to use as needed.  Plan: She  started chemotherapy with [facility] on October 25th.  We plan for 4 doses followed by 12 weeks of weekly Taxol.  Five days following her first dose, she started to develop severe back and bilateral leg pain, for which methocarbamol and NSAIDs have been ineffective.  This is likely due to her steroid premedication, so we will eliminate oral steroids at this time and decrease her IV dose with treatment.  I will send in a prescription for Flexeril to use if needed.  We advised her to try the half dose of Ativan 0.5 mg and one at bedtime.  We will see her back on November 22nd with CBC and CMP for reevaluation.  The patient understands the plans discussed today and is in agreement with them.  She knows to contact our office if she develops any problems or concerns.  I provided 25 minutes of face-to-face time during this this encounter and > 50% was spent counseling as documented under my assessment and plan.    Derwood Kaplan, MD [facility] AT [facility] [address] [place] [address] Dept: [phone] Dept Fax: [phone]   I, Rita Ohara, am acting as scribe for Derwood Kaplan, MD  I have reviewed this report as typed by the medical scribe, and it is complete and accurate.

## 2019-11-27 NOTE — Chronic Care Management (AMB) (Signed)
Chronic Care Management Pharmacy Assistant   Name: Chriss Redel  MRN: [id number] DOB: September 15, 1970  Reason for Encounter: Medication Review/ Monthly Dispensing Call  Patient Questions:  1.  Have you seen any other providers since your last visit? Yes, 10/28/2019- Dr. Noberto Retort (General Surgeon), 11/04/2019- Dr Noberto Retort (General Surgeon), 11/15/2019- Dayton Scrape, NP (Oncology).  2.  Any changes in your medicines or health? No   PCP : [name], MD  Allergies:   Allergies  Allergen Reactions  . Codeine Shortness Of Breath  . Celecoxib Other (See Comments)    Unknown reaction  . Ezetimibe-Simvastatin Other (See Comments)    Unknown reaction  . Propranolol Hcl Other (See Comments)    Unknown reaction    Medications: Outpatient Encounter Medications as of 11/27/2019  Medication Sig Note  . Blood Glucose Monitoring Suppl (ONETOUCH VERIO REFLECT) w/Device KIT AS DIRECTED   . Blood Glucose Monitoring Suppl (ONETOUCH VERIO REFLECT) w/Device KIT See admin instructions.   Marland Kitchen buPROPion (WELLBUTRIN XL) 300 MG 24 hr tablet Take 1 tablet (300 mg total) by mouth every evening.   . calcium citrate-vitamin D (CITRACAL+D) 315-200 MG-UNIT tablet Take 1 tablet by mouth 2 (two) times daily.   . cetirizine (ZYRTEC) 10 MG tablet Take 10 mg by mouth daily as needed (seasonal allergies).    . clotrimazole-betamethasone (LOTRISONE) cream Apply small amount to affected area twice daily (Patient taking differently: as needed. Apply small amount to affected area twice daily)   . dicyclomine (BENTYL) 20 MG tablet TAKE ONE TABLET BY MOUTH BEFORE MEALS AND AT BEDTIME AS NEEDED FOR STOMACH CRAMPING   . Dulaglutide (TRULICITY) 4.09 WJ/1.9JY SOPN INJECT ONE SYRINGE ONCE A WEEK   . Empagliflozin-Linaglip-Metform 5-2.05-998 MG TB24 Take 1,000 mg by mouth daily.   . famotidine (PEPCID) 20 MG tablet Take 1 tablet (20 mg total) by mouth 2 (two) times daily.   . ferrous sulfate 325 (65 FE) MG tablet Take  325 mg by mouth every evening.   . gabapentin (NEURONTIN) 300 MG capsule Take 1 capsule (300 mg total) by mouth 2 (two) times daily. (Patient taking differently: Take 300 mg by mouth 3 (three) times daily. )   . glucose blood test strip 1 each by Other route in the morning and at bedtime. One Touch Verio - Patient checks blood sugar 2-3 times daily.   Marland Kitchen GUAR GUM PO Take by mouth daily.   . Insulin Pen Needle (BD PEN NEEDLE NANO U/F) 32G X 4 MM MISC Inject 1 each into the skin in the morning and at bedtime.   Marland Kitchen LANTUS SOLOSTAR 100 UNIT/ML Solostar Pen INJECT 68 UNITS SUBCUTANEOUSLY EVERYDAY AT BEDTIME   . Levomilnacipran HCl ER (FETZIMA) 80 MG CP24 Take 1 capsule by mouth daily.   Marland Kitchen levothyroxine (SYNTHROID) 75 MCG tablet Take 1 tablet (75 mcg total) by mouth daily.   Marland Kitchen loratadine (CLARITIN) 10 MG tablet Take 10 mg by mouth daily. Taking 3-4 days each week due to bone pain associated with shot from cancer center.   Marland Kitchen LORazepam (ATIVAN) 0.5 MG tablet TAKE ONE TABLET BY MOUTH TWICE DAILY AS NEEDED FOR ANXIETY   . losartan (COZAAR) 50 MG tablet Take 1 tablet (50 mg total) by mouth every evening.   . methocarbamol (ROBAXIN) 500 MG tablet Take 1 tablet (500 mg total) by mouth 2 (two) times daily.   Marland Kitchen morphine (MS CONTIN) 30 MG 12 hr tablet Take 1 tablet (30 mg total) by mouth every 12 (twelve) hours.   Marland Kitchen  Multiple Vitamin (MULTIVITAMIN WITH MINERALS) TABS tablet Take 1 tablet by mouth daily.  06/08/2018: Pt plans on purchasing again when she can get out.  . multivitamin-iron-minerals-folic acid (CENTRUM) chewable tablet Chew 1 tablet by mouth daily.   . ondansetron (ZOFRAN) 4 MG tablet TAKE ONE TABLET BY MOUTH FOUR TIMES DAILY AS NEEDED FOR NAUSEA   . ondansetron (ZOFRAN-ODT) 4 MG disintegrating tablet Take 4 times a day as needed   . OneTouch Delica Lancets 16X MISC 1 each by Does not apply route daily before breakfast. Check blood sugar twice daily.   . pantoprazole (PROTONIX) 40 MG tablet Take 1 tablet  (40 mg total) by mouth 2 (two) times daily.   . polyethylene glycol (MIRALAX / GLYCOLAX) 17 g packet Take 17 g by mouth daily.   . potassium chloride (MICRO-K) 10 MEQ CR capsule Take 2 capsules (20 mEq total) by mouth 2 (two) times daily.   . pravastatin (PRAVACHOL) 40 MG tablet Take 1 tablet (40 mg total) by mouth daily.   . prochlorperazine (COMPAZINE) 10 MG tablet Take 10 mg by mouth every 6 (six) hours as needed for nausea or vomiting.   Marland Kitchen Specialty Vitamins Products (MAGNESIUM, AMINO ACID CHELATE,) 133 MG tablet Take 1 tablet by mouth at bedtime.   . sucralfate (CARAFATE) 1 g tablet Take 1 tablet (1 g total) by mouth 4 (four) times daily -  with meals and at bedtime.   Marland Kitchen SYNJARDY 12.05-998 MG TABS TAKE ONE TABLET BY MOUTH ONCE DAILY   . Vitamin D, Ergocalciferol, (DRISDOL) 1.25 MG (50000 UNIT) CAPS capsule Take 1 capsule (50,000 Units total) by mouth every Friday. Friday nights   . zolpidem (AMBIEN) 10 MG tablet Take 1 tablet (10 mg total) by mouth at bedtime as needed.    No facility-administered encounter medications on file as of 11/27/2019.    Current Diagnosis: Patient Active Problem List   Diagnosis Date Noted  . Malignant neoplasm of lower-inner quadrant of right breast of female, estrogen receptor negative ([place]) 10/01/2019  . Chronic pain syndrome 07/08/2019  . Memory loss 07/08/2019  . Depression, major, recurrent, mild ([place]) 07/08/2019  . Uncomplicated opioid dependence ([place]) 07/08/2019  . Mixed hyperlipidemia 04/11/2019  . Dyslipidemia associated with type 2 diabetes mellitus ([place]) 04/11/2019  . Essential hypertension, benign 04/11/2019  . Major depressive disorder, single episode, mild ([place]) 04/11/2019  . Acquired hypothyroidism 04/11/2019  . Vitamin D insufficiency 04/11/2019  . Class 2 severe obesity due to excess calories with serious comorbidity and body mass index (BMI) of 35.0 to 35.9 in adult ([name]) 04/11/2019  . Pre-ulcerative calluses 03/04/2019  . BRCA1 positive  06/14/2016   Reviewed chart for medication changes ahead of medication coordination call.  OVs & Consults- 10/28/2019- Dr. Noberto Retort (General Surgeon), 11/04/2019- Dr Noberto Retort (General Surgeon), 11/15/2019- Dayton Scrape, NP (Oncology) since last care coordination call/Pharmacist visit.  No medication changes indicated.  BP Readings from Last 3 Encounters:  11/27/19 139/65  11/20/19 124/61  11/18/19 122/63    Lab Results  Component Value Date   HGBA1C 6.9 (H) 10/09/2019     Patient obtains medications through Vials  30 Days   Last adherence delivery included:  Synjardy 12.5-1000mg- 1 tablet daily Morphine Sulfate 30 mg -2 times daily PRN Zolpidem 10 mg- 1 tablet as needed at bedtime Lantus Solostar U 100- inject 0.68 mL into skin at bedtime Dicyclomine 20 mg- 1 tablet as needed before meals and bedtime Pantoprazole 40 mg- 1 tablet twice daily Methocarbamol 500 mg- 1 tablet  twice daily Losartan Potassium 50 mg- 1 tablet in evening Famotidine 20 mg- 1 tablet twice daily Gabapentin 300 mg- 1 capsule twice daily Pravastatin 20 mg- 1 tablet at bedtime Bupropion Hcl Xl 300 mg- 1 tablet in evening Ergocalciferol 1250 mcg- 1 capsule weekly on Friday's Potassium chloride ER 10 meq- 2 capsules twice daily Levothyroxine 75 mcg- 1 tablet daily Trulicity- inject 0.5 mg sq weekly Fetzima-80 mg- 1 capsule daily Ondansetron 4 mg- 1 tablet four times a day as needed for nausea  Lorazepam 0.5 mg- 1 tablet twice a day as needed  Patient did not decline any medications last month.    Patient is due for next adherence delivery on: 11/29/2019. Called patient and reviewed medications and coordinated delivery.  This delivery to include: Synjardy 12.5-1000mg- 1 tablet daily Morphine Sulfate 30 mg -2 times daily PRN Zolpidem 10 mg- 1 tablet as needed at bedtime Lantus Solostar U 100- inject 0.68 mL into skin at bedtime Dicyclomine 20 mg- 1 tablet as needed before meals and  bedtime Pantoprazole 40 mg- 1 tablet twice daily Methocarbamol 500 mg- 1 tablet twice daily Losartan Potassium 50 mg- 1 tablet in evening Famotidine 20 mg- 1 tablet twice daily Gabapentin 300 mg- 1 capsule twice daily Pravastatin 20 mg- 1 tablet at bedtime Bupropion Hcl Xl 300 mg- 1 tablet in evening Ergocalciferol 1250 mcg- 1 capsule weekly on Friday's Potassium chloride ER 10 meq- 2 capsules twice daily Levothyroxine 75 mcg- 1 tablet daily Trulicity- inject 0.5 mg sq weekly Fetzima-80 mg- 1 capsule daily Ondansetron 4 mg- 1 tablet four times a day as needed for nausea  Lorazepam 0.5 mg- 1 tablet twice a day as needed  No short fill or acute fill needed.  Patient declined the following medications Dicyclomine 20 mg due to PRN use, Methocarbamol 500 mg due to being discontinued by Dr Hinton Rao and replacing with Cyclobenzaprine(per patient), Dexamethasone 4 mg due to once treatment- patient has completed.   Patient needs refills for Zolpidem and Morphine and new prescription for Gabapentin 300 mg.  Confirmed delivery date of 11/29/2019, advised patient that pharmacy will contact them the morning of delivery.  Follow-Up:  Coordination of Enhanced Pharmacy Services and Pharmacist Review  Patient informed during infusion she was able to speak with Oncologist Dr. Hinton Rao and she suggested patient could take 1 whole tablet of Lorazepam at night to help with RLS symptoms and 1/2 tablet during the day. Dr Hinton Rao also discontinued Methocarbamol 500 mg and will be sending in Cyclobenzaprine. Patient also mentioned that she has increased her Gabapentin to 3 times a day which is helping her RLS symptoms. Patient stated Dr. Hinton Rao would like Dr. Tobie Poet to monitor her blood sugars due to frequent Prednisone treatment for chemotherapy and needs an increase of Trulicity or Synjardy due to uncontrolled blood sugar readings. Patient states her blood sugars range from 192-299 and up to 300. Donette Larry,  CPP notified, discussion with PCP in progress.  Pattricia Boss, [employer] Pharmacist Assistant [phone]

## 2019-11-27 NOTE — Chronic Care Management (AMB) (Signed)
Patient states that Dr. Hinton Rao would like for Dr. Tobie Poet to help manage elevated blood sugars.   Pharmacist spoke with Colvin Caroli (Diabetes Educator) to help determine best option for control. Recommend 4 units of rapid acting insulin before meals on days that patient is receiving steroids. Novolog is listed on patient's formulary. If patient is not eating, patient should not administer insulin. Discussed following-up with patient 2-3 days into using rapid acting insulin to determine if patient needs additional correction if blood sugars are elevated prior to meals.   Patient would benefit from CGM with all of these extra readings needed.   Recommend:  1) Novolog 4 units before meals on days that patient is taking steroids around chemotherapy treatment.  2) Consider Dexcom sent to [facility] due to basal and bolus insulin regimen.   Sherre Poot, PharmD, [facility] Clinical Pharmacist [name] [phone] (office) [phone] (mobile)

## 2019-11-28 ENCOUNTER — Telehealth: Payer: Self-pay

## 2019-11-28 ENCOUNTER — Other Ambulatory Visit: Payer: Self-pay | Admitting: Family Medicine

## 2019-11-28 ENCOUNTER — Telehealth: Payer: Self-pay | Admitting: Oncology

## 2019-11-28 DIAGNOSIS — Z1501 Genetic susceptibility to malignant neoplasm of breast: Secondary | ICD-10-CM

## 2019-11-28 NOTE — Telephone Encounter (Signed)
Per 11/4 Schedule Message patients November Appt's were readjusted Added Lab Appt's, Follow ups - Rescheduled Treatments.  Patient notified of the changed and agreed

## 2019-11-28 NOTE — Telephone Encounter (Addendum)
Pt called to ask if a muscle relaxer had been sent in for her. She was only able to sleep for 3 hours last evening due to restless leg. [phone]  Dr Hinton Rao sent in medication last evening. Pt notified.

## 2019-11-29 ENCOUNTER — Other Ambulatory Visit: Payer: PPO

## 2019-11-29 ENCOUNTER — Other Ambulatory Visit: Payer: Self-pay

## 2019-11-29 DIAGNOSIS — Z1501 Genetic susceptibility to malignant neoplasm of breast: Secondary | ICD-10-CM

## 2019-11-29 DIAGNOSIS — Z1509 Genetic susceptibility to other malignant neoplasm: Secondary | ICD-10-CM

## 2019-11-29 MED ORDER — ZOLPIDEM TARTRATE 10 MG PO TABS: ORAL_TABLET | ORAL | 0 refills | Status: DC

## 2019-11-29 MED ORDER — MORPHINE SULFATE ER 30 MG PO TBCR
30.0000 mg | EXTENDED_RELEASE_TABLET | Freq: Two times a day (BID) | ORAL | 0 refills | Status: DC
Start: 2019-11-29 — End: 2020-04-06

## 2019-11-29 MED ORDER — GABAPENTIN 300 MG PO CAPS: 300.0000 mg | ORAL_CAPSULE | Freq: Three times a day (TID) | ORAL | 0 refills | Status: DC

## 2019-12-02 ENCOUNTER — Inpatient Hospital Stay: Payer: PPO

## 2019-12-02 ENCOUNTER — Other Ambulatory Visit: Payer: Self-pay

## 2019-12-02 DIAGNOSIS — Z0001 Encounter for general adult medical examination with abnormal findings: Secondary | ICD-10-CM | POA: Diagnosis not present

## 2019-12-02 DIAGNOSIS — D649 Anemia, unspecified: Secondary | ICD-10-CM | POA: Diagnosis not present

## 2019-12-02 LAB — BASIC METABOLIC PANEL
BUN: 8 (ref 4–21)
CO2: 28 — AB (ref 13–22)
Chloride: 105 (ref 99–108)
Creatinine: 0.8 (ref 0.5–1.1)
Glucose: 220
Potassium: 3.6 (ref 3.4–5.3)
Sodium: 141 (ref 137–147)

## 2019-12-02 LAB — HEPATIC FUNCTION PANEL
ALT: 87 — AB (ref 7–35)
AST: 98 — AB (ref 13–35)
Alkaline Phosphatase: 114 (ref 25–125)
Bilirubin, Total: 0.6

## 2019-12-02 LAB — CBC AND DIFFERENTIAL
HCT: 35 — AB (ref 36–46)
Hemoglobin: 10.9 — AB (ref 12.0–16.0)
Neutrophils Absolute: 5.11
Platelets: 97 — AB (ref 150–399)
WBC: 6.9

## 2019-12-02 LAB — CBC: RBC: 4.15 (ref 3.87–5.11)

## 2019-12-02 LAB — COMPREHENSIVE METABOLIC PANEL
Albumin: 3.9 (ref 3.5–5.0)
Calcium: 9.8 (ref 8.7–10.7)

## 2019-12-03 ENCOUNTER — Other Ambulatory Visit: Payer: Self-pay | Admitting: Family Medicine

## 2019-12-03 DIAGNOSIS — E1121 Type 2 diabetes mellitus with diabetic nephropathy: Secondary | ICD-10-CM

## 2019-12-03 MED ORDER — DEXCOM G6 RECEIVER DEVI: 1.0000 | Freq: Three times a day (TID) | 3 refills | Status: DC

## 2019-12-03 MED ORDER — DEXCOM G6 SENSOR MISC: 3 refills | Status: DC

## 2019-12-03 MED ORDER — DEXCOM G6 TRANSMITTER MISC: 1.0000 | 3 refills | Status: DC

## 2019-12-03 MED ORDER — NOVOLOG FLEXPEN 100 UNIT/ML ~~LOC~~ SOPN: PEN_INJECTOR | SUBCUTANEOUS | 11 refills | Status: DC

## 2019-12-04 ENCOUNTER — Other Ambulatory Visit: Payer: Self-pay

## 2019-12-04 ENCOUNTER — Inpatient Hospital Stay: Payer: PPO

## 2019-12-04 VITALS — BP 136/61 | HR 98 | Temp 98.8°F | Resp 18 | Ht 62.0 in | Wt 210.2 lb

## 2019-12-04 DIAGNOSIS — C50311 Malignant neoplasm of lower-inner quadrant of right female breast: Secondary | ICD-10-CM

## 2019-12-04 DIAGNOSIS — Z5111 Encounter for antineoplastic chemotherapy: Secondary | ICD-10-CM | POA: Diagnosis not present

## 2019-12-04 DIAGNOSIS — Z171 Estrogen receptor negative status [ER-]: Secondary | ICD-10-CM

## 2019-12-04 MED ORDER — DEXAMETHASONE SODIUM PHOSPHATE 10 MG/ML IJ SOLN
INTRAMUSCULAR | Status: AC
  Filled 2019-12-04: qty 1

## 2019-12-04 MED ORDER — PALONOSETRON HCL INJECTION 0.25 MG/5ML
0.2500 mg | Freq: Once | INTRAVENOUS | Status: AC
  Administered 2019-12-04: 0.25 mg via INTRAVENOUS

## 2019-12-04 MED ORDER — DEXAMETHASONE SODIUM PHOSPHATE 10 MG/ML IJ SOLN
8.0000 mg | Freq: Once | INTRAMUSCULAR | Status: AC
  Administered 2019-12-04: 8 mg via INTRAVENOUS

## 2019-12-04 MED ORDER — CEPHALEXIN 500 MG PO CAPS: 500.0000 mg | ORAL_CAPSULE | Freq: Two times a day (BID) | ORAL | 0 refills | Status: AC

## 2019-12-04 MED ORDER — SODIUM CHLORIDE 0.9 % IV SOLN
600.0000 mg/m2 | Freq: Once | INTRAVENOUS | Status: AC
  Administered 2019-12-04: 1240 mg via INTRAVENOUS
  Filled 2019-12-04: qty 50

## 2019-12-04 MED ORDER — PALONOSETRON HCL INJECTION 0.25 MG/5ML
INTRAVENOUS | Status: AC
  Filled 2019-12-04: qty 5

## 2019-12-04 MED ORDER — DOXORUBICIN HCL CHEMO IV INJECTION 2 MG/ML
45.0000 mg/m2 | Freq: Once | INTRAVENOUS | Status: AC
  Administered 2019-12-04: 92 mg via INTRAVENOUS
  Filled 2019-12-04: qty 46

## 2019-12-04 MED ORDER — SODIUM CHLORIDE 0.9 % IV SOLN
Freq: Once | INTRAVENOUS | Status: AC
  Filled 2019-12-04: qty 250

## 2019-12-04 MED ORDER — HEPARIN SOD (PORK) LOCK FLUSH 100 UNIT/ML IV SOLN
500.0000 [IU] | Freq: Once | INTRAVENOUS | Status: AC | PRN
  Administered 2019-12-04: 500 [IU]
  Filled 2019-12-04: qty 5

## 2019-12-04 MED ORDER — SODIUM CHLORIDE 0.9 % IV SOLN
150.0000 mg | Freq: Once | INTRAVENOUS | Status: AC
  Administered 2019-12-04: 150 mg via INTRAVENOUS
  Filled 2019-12-04: qty 150

## 2019-12-04 NOTE — Progress Notes (Signed)
Evaluated patient in the infusion area for left lower leg redness and swelling. Patient states it got worse over the weekend with fever. Area is red with warmth. I will send in Keflex for 7 days.

## 2019-12-04 NOTE — Progress Notes (Signed)
1520:Patient complains of her left lower leg hurting more and swelling with redness appeared x 2 days ago. She did state that she noticed a fever on and off over weekend. She did state she called traige line with no response. Melissa FNP evaluated her sent in atb rx to start today. Edema 3+ tight and pitting 2+ with redness noted and warm to touch on lateral inside/ anterior lower leg. No weeping or breaks in skin noted today. Phone number given to patient for on call Md and instructed to call with any changes or concerns. PT STABLE AT TIME OF DISCHARGE

## 2019-12-04 NOTE — Patient Instructions (Signed)
[facility] Discharge Instructions for Patients Receiving Chemotherapy  Today you received the following chemotherapy agents Adriamycin, Cytoxan  To help prevent nausea and vomiting after your treatment, we encourage you to take your nausea medication as directed bu doctor.   If you develop nausea and vomiting that is not controlled by your nausea medication, call the clinic.   BELOW ARE SYMPTOMS THAT SHOULD BE REPORTED IMMEDIATELY:  *FEVER GREATER THAN 100.5 F  *CHILLS WITH OR WITHOUT FEVER  NAUSEA AND VOMITING THAT IS NOT CONTROLLED WITH YOUR NAUSEA MEDICATION  *UNUSUAL SHORTNESS OF BREATH  *UNUSUAL BRUISING OR BLEEDING  TENDERNESS IN MOUTH AND THROAT WITH OR WITHOUT PRESENCE OF ULCERS  *URINARY PROBLEMS  *BOWEL PROBLEMS  UNUSUAL RASH Items with * indicate a potential emergency and should be followed up as soon as possible.  Feel free to call the clinic should you have any questions or concerns at The clinic phone number is [phone].  Please show the Drain at check-in to the Emergency Department and triage nurse.

## 2019-12-05 ENCOUNTER — Other Ambulatory Visit: Payer: Self-pay

## 2019-12-05 DIAGNOSIS — E1121 Type 2 diabetes mellitus with diabetic nephropathy: Secondary | ICD-10-CM

## 2019-12-05 MED ORDER — DEXCOM G6 RECEIVER DEVI: 1.0000 | Freq: Three times a day (TID) | 3 refills | Status: DC

## 2019-12-05 MED ORDER — DEXCOM G6 TRANSMITTER MISC: 1.0000 | 3 refills | Status: DC

## 2019-12-05 MED ORDER — DEXCOM G6 SENSOR MISC: 3 refills | Status: DC

## 2019-12-06 ENCOUNTER — Inpatient Hospital Stay: Payer: PPO

## 2019-12-06 ENCOUNTER — Other Ambulatory Visit: Payer: Self-pay

## 2019-12-06 VITALS — BP 112/62 | HR 100 | Temp 98.4°F | Resp 18 | Ht 62.0 in | Wt 213.8 lb

## 2019-12-06 DIAGNOSIS — Z5111 Encounter for antineoplastic chemotherapy: Secondary | ICD-10-CM | POA: Diagnosis not present

## 2019-12-06 DIAGNOSIS — Z171 Estrogen receptor negative status [ER-]: Secondary | ICD-10-CM

## 2019-12-06 MED ORDER — PEGFILGRASTIM-JMDB 6 MG/0.6ML ~~LOC~~ SOSY
PREFILLED_SYRINGE | SUBCUTANEOUS | Status: AC
  Filled 2019-12-06: qty 0.6

## 2019-12-06 MED ORDER — PEGFILGRASTIM-JMDB 6 MG/0.6ML ~~LOC~~ SOSY
6.0000 mg | PREFILLED_SYRINGE | Freq: Once | SUBCUTANEOUS | Status: AC
  Administered 2019-12-06: 6 mg via SUBCUTANEOUS

## 2019-12-06 NOTE — Patient Instructions (Signed)

## 2019-12-06 NOTE — Progress Notes (Signed)
PT STABLE AT TIME OF DISCHARGE.

## 2019-12-12 ENCOUNTER — Other Ambulatory Visit: Payer: Self-pay | Admitting: Hematology and Oncology

## 2019-12-12 MED ORDER — CEPHALEXIN 500 MG PO CAPS: 500.0000 mg | ORAL_CAPSULE | Freq: Two times a day (BID) | ORAL | 0 refills | Status: AC

## 2019-12-12 NOTE — Progress Notes (Signed)
PT STABLE AT TIME OF DISCHARGE

## 2019-12-16 ENCOUNTER — Ambulatory Visit: Payer: PPO

## 2019-12-17 ENCOUNTER — Other Ambulatory Visit: Payer: Self-pay | Admitting: Hematology and Oncology

## 2019-12-17 ENCOUNTER — Telehealth: Payer: Self-pay

## 2019-12-17 DIAGNOSIS — C50311 Malignant neoplasm of lower-inner quadrant of right female breast: Secondary | ICD-10-CM

## 2019-12-17 DIAGNOSIS — Z171 Estrogen receptor negative status [ER-]: Secondary | ICD-10-CM

## 2019-12-17 NOTE — Telephone Encounter (Signed)
Pt called c/o bilateral swelling and redness of feet and lower legs.  Spoke with Lenna Sciara, NP who was aware and she ordered an ultra sound to done on 12/18/2019.

## 2019-12-18 ENCOUNTER — Ambulatory Visit: Payer: PPO

## 2019-12-18 ENCOUNTER — Other Ambulatory Visit: Payer: Self-pay | Admitting: Family Medicine

## 2019-12-18 DIAGNOSIS — R600 Localized edema: Secondary | ICD-10-CM | POA: Diagnosis not present

## 2019-12-18 DIAGNOSIS — C50311 Malignant neoplasm of lower-inner quadrant of right female breast: Secondary | ICD-10-CM | POA: Diagnosis not present

## 2019-12-18 DIAGNOSIS — Z853 Personal history of malignant neoplasm of breast: Secondary | ICD-10-CM | POA: Diagnosis not present

## 2019-12-18 DIAGNOSIS — M79605 Pain in left leg: Secondary | ICD-10-CM | POA: Diagnosis not present

## 2019-12-18 DIAGNOSIS — M7121 Synovial cyst of popliteal space [Baker], right knee: Secondary | ICD-10-CM | POA: Diagnosis not present

## 2019-12-18 DIAGNOSIS — Z1501 Genetic susceptibility to malignant neoplasm of breast: Secondary | ICD-10-CM

## 2019-12-18 NOTE — Progress Notes (Signed)
[facility]  [address] [place],  [place]  [address] [phone]  Clinic Day:  12/23/2019  Referring physician: [name], MD   CHIEF COMPLAINT:  CC: Breast cancer  Current Treatment:  AC for 4 doses to be followed by weekly Taxol for 12 doses   HISTORY OF PRESENT ILLNESS:  Linda [name] is a 49 y.o. female who we began seeing in February 2018 for evaluation of anemia.  Her anemia was felt to be secondary to iron deficiency and we recommended continuation of oral iron supplementation for a total of 6 months, as well as referral back to the gastroenterologist.  The patient also has a history of thrombocytopenia and had previously seen Dr. Bobby Rumpf in 2014.  The thrombocytopenia was felt to be secondary to mild chronic immune thrombocytopenic purpura (ITP).  Due to the patient's family history of malignancy, she underwent testing for hereditary cancer syndromes with the [employer] myRisk Hereditary Cancer Panel test.  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  She underwent a robotic hysterectomy and bilateral salpingo-oophorectomy in May 2018 with Dr. Everitt Amber.  Pathology was benign.  We also discussed the option of risk reducing bilateral mastectomy, but she has chosen to have close surveillance, so I recommend MRI scans of the breast in addition to and will mammography.  She was placed on chemoprevention with raloxifene in September 2018.  CT abdomen and pelvis from August 6th confirmed a tiny left renal calculus, as well as probable hepatic cirrhosis without evidence of hepatic mass.  She was then referred to [place] and is followed by Roosevelt Locks, CRNP in [place] for her liver cirrhosis.  She tested negative for hepatitis A, B, and C.  She has received her hepatitis vaccines in June and  July and will be due for the 3rd one in December.  We saw her in September 2021 for a new diagnosis of breast cancer, and to discuss recent pathology results.  She underwent screening bilateral mammogram on August 3rd which revealed possible masses in the right breast.  Diagnostic right mammogram and right breast ultrasound from August 18th confirmed suspicious masses in the right breast at 5 o'clock measuring 1.5 cm and 6 o'clock measuring 1.4 cm.  There was an indeterminate 4 mm with possible distortion in the outer right breast without sonographic correlate.  Right axillary ultrasound was normal.  She then underwent biopsies of both masses and surgical pathology from these procedures revealed  invasive ductal carcinoma, grade 3, with necrosis at 5 o'clock; and invasive ductal carcinoma, grade 1-2, with necrosis and focal myxoid change at 6 o'clock.  No DCIS was identified.  Breast prognostic profiles revealed HER2, and estrogen and progesterone receptors to be negative. Ki67 was 30% at the 5 o'clock mass, and 40% at 6 o'clock.  She underwent bilateral mastectomies on September 24th with Dr. Noberto Retort, and surgical pathology from this procedure revealed invasive ductal carcinoma, grade 3, with myxoid change, 19 mm, at 6 o'clock, and invasive ductal carcinoma, grade 3, and ductal carcinoma in situ, 19 mm, at 5 o'clock.  All margins were negative for invasive carcinoma or DCIS.  Two sentinel lymph nodes were negative for metastatic carcinoma (0/2), staging this as a ([id number]).  CT chest, abdomen and pelvis from September revealed no findings of metastatic disease.  She started [facility] chemotherapy on October  25th.  She had severe restless leg syndrome with oral corticosteroids.    INTERVAL HISTORY:  She is here for routine follow up prior to a 3rd cycle of [facility] chemotherapy schedule for November 30th.  She reports moderate fatigue.  She also reports pain and tenderness of the right lower extremity from a Baker's cyst  and rates her pain as a 7/10 today.  She also has some anxiety for which she is taking lorazepam as needed.  Her hemoglobin has decreased from 10.9 to 8.7, and her white count and platelets are normal.  I will order iron studies, B12 and folate for further evaluation, but likely her worsening anemia is secondary to chemotherapy.  Chemistries are unremarkable except for a blood glucose of 271.  Her  appetite is good, and she has gained 5 pounds since her last visit.  She denies fever, chills or other signs of infection.  She denies nausea, vomiting, bowel issues, or abdominal pain.  She denies sore throat, cough, dyspnea, or chest pain.   REVIEW OF SYSTEMS:  Review of Systems  Constitutional: Positive for fatigue (moderate).  HENT:  Negative.   Eyes: Negative.   Respiratory: Negative.   Cardiovascular: Negative.   Gastrointestinal: Negative.   Endocrine: Negative.   Genitourinary: Negative.    Musculoskeletal:       Right knee pain and tenderness due to Baker's cyst  Skin: Negative.   Neurological: Negative.   Hematological: Negative.   Psychiatric/Behavioral: The patient is nervous/anxious (mild).      VITALS:  Blood pressure (!) 150/67, pulse (!) 110, temperature 98.7 F (37.1 C), resp. rate 18, weight 218 lb 1.6 oz (98.9 kg), last menstrual period 06/13/2009, SpO2 96 %.  Wt Readings from Last 3 Encounters:  12/23/19 218 lb 1.6 oz (98.9 kg)  12/23/19 218 lb 3.2 oz (99 kg)  12/06/19 213 lb 12 oz (97 kg)    Body mass index is 39.89 kg/m.  Performance status (ECOG): 1 - Symptomatic but completely ambulatory  PHYSICAL EXAM:  Physical Exam Constitutional:      General: She is not in acute distress.    Appearance: Normal appearance. She is normal weight.  HENT:     Head: Normocephalic and atraumatic.  Eyes:     General: No scleral icterus.    Extraocular Movements: Extraocular movements intact.     Conjunctiva/sclera: Conjunctivae normal.     Pupils: Pupils are equal, round,  and reactive to light.  Cardiovascular:     Rate and Rhythm: Regular rhythm. Tachycardia present.     Pulses: Normal pulses.     Heart sounds: Normal heart sounds. No murmur heard.  No friction rub. No gallop.   Pulmonary:     Effort: Pulmonary effort is normal. No respiratory distress.     Breath sounds: Normal breath sounds.  Chest:     Breasts:        Right: Absent.        Left: Absent.     Comments: Bilateral mastectomies are negative Abdominal:     General: Bowel sounds are normal. There is no distension.     Palpations: Abdomen is soft. There is no mass.     Tenderness: There is no abdominal tenderness.  Musculoskeletal:        General: Normal range of motion.     Cervical back: Normal range of motion and neck supple.     Right lower leg: Edema (mild) present.     Left lower leg: Edema (mild) present.  Lymphadenopathy:     Cervical: No cervical adenopathy.  Skin:    General: Skin is warm and dry.  Neurological:     General: No focal deficit present.     Mental Status: She is alert and oriented to person, place, and time. Mental status is at baseline.  Psychiatric:        Mood and Affect: Mood normal.        Behavior: Behavior normal.        Thought Content: Thought content normal.        Judgment: Judgment normal.     LABS:   CBC Latest Ref Rng & Units 12/23/2019 12/02/2019 11/27/2019  WBC - 6.2 6.9 0.9(LL)  Hemoglobin 12.0 - 16.0 8.7(A) 10.9(A) 9.8(L)  Hematocrit 36 - 46 27(A) 35(A) 32.9(L)  Platelets 150 - 399 131(A) 97(A) 36(L)   CMP Latest Ref Rng & Units 12/23/2019 12/02/2019 11/27/2019  Glucose 70 - 99 mg/dL - - 134(H)  BUN 4 - 21 7 8 8   Creatinine 0.5 - 1.1 0.9 0.8 0.88  Sodium 137 - 147 139 141 139  Potassium 3.4 - 5.3 3.9 3.6 3.6  Chloride 99 - 108 99 105 107  CO2 13 - 22 29(A) 28(A) 23  Calcium 8.7 - 10.7 9.0 9.8 8.6(L)  Total Protein 6.5 - 8.1 g/dL - - 6.4(L)  Total Bilirubin 0.3 - 1.2 mg/dL - - 0.8  Alkaline Phos 25 - 125 94 114 62  AST 13 - 35  31 98(A) 26  ALT 7 - 35 24 87(A) 28    STUDIES:  No results found.   Allergies:  Allergies  Allergen Reactions  . Codeine Shortness Of Breath  . Celecoxib Other (See Comments)    Unknown reaction  . Ezetimibe-Simvastatin Other (See Comments)    Unknown reaction  . Propranolol Hcl Other (See Comments)    Unknown reaction    Current Medications: Current Outpatient Medications  Medication Sig Dispense Refill  . Blood Glucose Monitoring Suppl (ONETOUCH VERIO REFLECT) w/Device KIT AS DIRECTED    . buPROPion (WELLBUTRIN XL) 300 MG 24 hr tablet TAKE ONE TABLET BY MOUTH EVERY EVENING 90 tablet 1  . calcium citrate-vitamin D (CITRACAL+D) 315-200 MG-UNIT tablet Take 1 tablet by mouth 2 (two) times daily.    . cephALEXin (KEFLEX) 500 MG capsule Take 500 mg by mouth 2 (two) times daily.    . clotrimazole-betamethasone (LOTRISONE) cream Apply small amount to affected area twice daily (Patient taking differently: as needed. Apply small amount to affected area twice daily) 45 g 0  . Continuous Blood Gluc Receiver ([place]) DEVI 1 each by Does not apply route in the morning, at noon, and at bedtime. 1 each 3  . Continuous Blood Gluc Sensor (DEXCOM G6 SENSOR) MISC Check sugars qac and qhs. 9 each 3  . Continuous Blood Gluc Transmit (DEXCOM G6 TRANSMITTER) MISC 1 each by Does not apply route every 3 (three) months. 1 each 3  . cyclobenzaprine (FLEXERIL) 10 MG tablet Take 10 mg by mouth every 8 (eight) hours as needed.    Marland Kitchen dexamethasone (DECADRON) 4 MG tablet Take 4 mg by mouth 2 (two) times daily with a meal.    . dicyclomine (BENTYL) 20 MG tablet TAKE ONE TABLET BY MOUTH BEFORE MEALS AND AT BEDTIME AS NEEDED FOR STOMACH CRAMPING 180 tablet 1  . Dulaglutide (TRULICITY) 0.45 WU/9.8JX SOPN INJECT ONE SYRINGE ONCE A WEEK 6 mL 1  . Empagliflozin-Linaglip-Metform 5-2.05-998 MG TB24 Take 1,000 mg by mouth  daily.    . famotidine (PEPCID) 20 MG tablet Take 1 tablet (20 mg total) by mouth 2 (two)  times daily. 180 tablet 1  . ferrous sulfate 325 (65 FE) MG tablet Take 325 mg by mouth every evening.    . gabapentin (NEURONTIN) 300 MG capsule Take 1 capsule (300 mg total) by mouth 3 (three) times daily. 270 capsule 0  . glucose blood test strip 1 each by Other route in the morning and at bedtime. One Touch Verio - Patient checks blood sugar 2-3 times daily. 100 each 2  . insulin aspart (NOVOLOG FLEXPEN) 100 UNIT/ML FlexPen Three times before meals while on prednisone. 15 mL 11  . Insulin Pen Needle (BD PEN NEEDLE NANO U/F) 32G X 4 MM MISC Inject 1 each into the skin in the morning and at bedtime. 200 each 3  . LANTUS SOLOSTAR 100 UNIT/ML Solostar Pen INJECT 68 UNITS SUBCUTANEOUSLY EVERYDAY AT BEDTIME 15 mL 2  . Levomilnacipran HCl ER (FETZIMA) 80 MG CP24 Take 1 capsule by mouth daily. 90 capsule 1  . levothyroxine (SYNTHROID) 75 MCG tablet TAKE ONE TABLET BY MOUTH ONCE DAILY 90 tablet 1  . loratadine (CLARITIN) 10 MG tablet Take 10 mg by mouth daily. Taking 3-4 days each week due to bone pain associated with shot from cancer center.    Marland Kitchen LORazepam (ATIVAN) 0.5 MG tablet TAKE ONE TABLET BY MOUTH TWICE DAILY AS NEEDED FOR ANXIETY 30 tablet 2  . losartan (COZAAR) 50 MG tablet TAKE ONE TABLET BY MOUTH EVERY EVENING 90 tablet 1  . morphine (MS CONTIN) 30 MG 12 hr tablet Take 1 tablet (30 mg total) by mouth every 12 (twelve) hours. 180 tablet 0  . Multiple Vitamin (MULTIVITAMIN WITH MINERALS) TABS tablet Take 1 tablet by mouth daily.     . ondansetron (ZOFRAN) 4 MG tablet TAKE ONE TABLET BY MOUTH FOUR TIMES DAILY AS NEEDED FOR NAUSEA 30 tablet 1  . OneTouch Delica Lancets 49F MISC 1 each by Does not apply route daily before breakfast. Check blood sugar twice daily. 100 each 3  . pantoprazole (PROTONIX) 40 MG tablet Take 1 tablet (40 mg total) by mouth 2 (two) times daily. 180 tablet 1  . Pegfilgrastim-jmdb (FULPHILA [place]) Inject into the skin.    . potassium chloride (MICRO-K) 10 MEQ CR capsule TAKE  TWO CAPSULES BY MOUTH TWICE DAILY 180 capsule 1  . pravastatin (PRAVACHOL) 40 MG tablet Take 1 tablet (40 mg total) by mouth daily. 90 tablet 0  . prochlorperazine (COMPAZINE) 10 MG tablet Take 10 mg by mouth every 6 (six) hours as needed for nausea or vomiting.    . senna (SENOKOT) 8.6 MG tablet Take 1 tablet by mouth daily as needed for constipation.    Marland Kitchen Specialty Vitamins Products (MAGNESIUM, AMINO ACID CHELATE,) 133 MG tablet Take 1 tablet by mouth at bedtime.    . sucralfate (CARAFATE) 1 g tablet Take 1 tablet (1 g total) by mouth 4 (four) times daily -  with meals and at bedtime. 270 tablet 1  . SYNJARDY 12.05-998 MG TABS TAKE ONE TABLET BY MOUTH ONCE DAILY 90 tablet 0  . Vitamin D, Ergocalciferol, (DRISDOL) 1.25 MG (50000 UNIT) CAPS capsule TAKE ONE CAPSULE BY MOUTH ONCE WEEKLY ON FRIDAY 12 capsule 1  . zolpidem (AMBIEN) 10 MG tablet TAKE ONE TABLET BY MOUTH EVERYDAY AT BEDTIME AS NEEDED 90 tablet 0   No current facility-administered medications for this visit.     ASSESSMENT & PLAN:  Assessment:   1. Positive BRCA 1 mutation, which increases her risk for breast and ovarian cancer, as well as elevates her risk for pancreatic cancer, for which she underwent hysterectomy/bilateral salpingo-oophorectomy and is on chemoprevention with raloxifene and regular surveillance.   She declined bilateral prophylactic mastectomies.   2.  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  She has now undergone bilateral mastectomies.  She started [facility] chemotherapy on October 25th, we will plan for 4 doses followed by 12 weeks of weekly Taxol.  3.  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  I think this is a separate primary.    4. Thrombocytopenia which is felt to be due  to chronic ITP, normalized at this time.  5. Liver cirrhosis as seen on CT imaging in August 2020.  She has been referred and is being seen by Roosevelt Locks, CRNP, at [facility] in [place].  Hepatitis panel was negative, and she has received the appropriate vaccines.  Abdominal ultrasound from April was stable and CT imaging from September was stable.    6. Nevi of the back which need to be monitored.  7.  Severe back and bilateral leg pain associated with restless legs, felt to be due to corticosteroids.  We will be cautious with oral steroids while she is on Taxol.  8.  Worsening anemia, likely secondary to chemotherapy.  I will order iron studies, B12 and folate for completeness.    Plan: She will proceed with a 3rd cycle of AC chemotherapy tomorrow.  We plan for 4 doses followed by 12 weeks of weekly Taxol.  Her anemia has worsened, likely secondary the chemotherapy, but I will check iron studies, B12 and folate for completeness.  For now, we will hold off on transfusion.  We will see her back in 3 weeks with CBC and CMP for reevaluation prior to a 4th and final dose of AC.  The patient understands the plans discussed today and is in agreement with them.  She knows to contact our office if she develops any problems or concerns.  I provided 20 minutes of face-to-face time during this this encounter and > 50% was spent counseling as documented under my assessment and plan.    Derwood Kaplan, MD [facility] AT [facility] [address] [place] [address] Dept: [phone] Dept Fax: [phone]   I, Rita Ohara, am acting as scribe for Derwood Kaplan, MD  I have reviewed this report as typed by the medical scribe, and it is complete and accurate.

## 2019-12-21 ENCOUNTER — Other Ambulatory Visit: Payer: Self-pay | Admitting: Oncology

## 2019-12-23 ENCOUNTER — Encounter: Payer: Self-pay | Admitting: Oncology

## 2019-12-23 ENCOUNTER — Telehealth: Payer: Self-pay | Admitting: Oncology

## 2019-12-23 ENCOUNTER — Inpatient Hospital Stay (INDEPENDENT_AMBULATORY_CARE_PROVIDER_SITE_OTHER): Payer: PPO | Admitting: Oncology

## 2019-12-23 ENCOUNTER — Other Ambulatory Visit: Payer: Self-pay

## 2019-12-23 ENCOUNTER — Other Ambulatory Visit: Payer: Self-pay | Admitting: Oncology

## 2019-12-23 ENCOUNTER — Telehealth: Payer: Self-pay

## 2019-12-23 ENCOUNTER — Inpatient Hospital Stay: Payer: PPO | Admitting: Hematology and Oncology

## 2019-12-23 VITALS — BP 150/67 | HR 110 | Temp 98.7°F | Resp 18 | Wt 218.1 lb

## 2019-12-23 VITALS — BP 150/67 | HR 110 | Temp 98.7°F | Resp 18 | Ht 62.0 in | Wt 218.2 lb

## 2019-12-23 DIAGNOSIS — M79605 Pain in left leg: Secondary | ICD-10-CM | POA: Diagnosis not present

## 2019-12-23 DIAGNOSIS — G2581 Restless legs syndrome: Secondary | ICD-10-CM

## 2019-12-23 DIAGNOSIS — Z79899 Other long term (current) drug therapy: Secondary | ICD-10-CM

## 2019-12-23 DIAGNOSIS — D649 Anemia, unspecified: Secondary | ICD-10-CM

## 2019-12-23 DIAGNOSIS — M79604 Pain in right leg: Secondary | ICD-10-CM | POA: Diagnosis not present

## 2019-12-23 DIAGNOSIS — C50311 Malignant neoplasm of lower-inner quadrant of right female breast: Secondary | ICD-10-CM

## 2019-12-23 DIAGNOSIS — Z171 Estrogen receptor negative status [ER-]: Secondary | ICD-10-CM | POA: Diagnosis not present

## 2019-12-23 DIAGNOSIS — E538 Deficiency of other specified B group vitamins: Secondary | ICD-10-CM | POA: Diagnosis not present

## 2019-12-23 DIAGNOSIS — D225 Melanocytic nevi of trunk: Secondary | ICD-10-CM | POA: Diagnosis not present

## 2019-12-23 DIAGNOSIS — D696 Thrombocytopenia, unspecified: Secondary | ICD-10-CM

## 2019-12-23 DIAGNOSIS — K7581 Nonalcoholic steatohepatitis (NASH): Secondary | ICD-10-CM

## 2019-12-23 DIAGNOSIS — C50811 Malignant neoplasm of overlapping sites of right female breast: Secondary | ICD-10-CM

## 2019-12-23 DIAGNOSIS — K7460 Unspecified cirrhosis of liver: Secondary | ICD-10-CM | POA: Diagnosis not present

## 2019-12-23 DIAGNOSIS — Z9013 Acquired absence of bilateral breasts and nipples: Secondary | ICD-10-CM | POA: Diagnosis not present

## 2019-12-23 DIAGNOSIS — Z0001 Encounter for general adult medical examination with abnormal findings: Secondary | ICD-10-CM | POA: Diagnosis not present

## 2019-12-23 LAB — CBC: RBC: 3.14 — AB (ref 3.87–5.11)

## 2019-12-23 LAB — COMPREHENSIVE METABOLIC PANEL
Albumin: 3.7 (ref 3.5–5.0)
Calcium: 9 (ref 8.7–10.7)

## 2019-12-23 LAB — BASIC METABOLIC PANEL
BUN: 7 (ref 4–21)
CO2: 29 — AB (ref 13–22)
Chloride: 99 (ref 99–108)
Creatinine: 0.9 (ref 0.5–1.1)
Glucose: 271
Potassium: 3.9 (ref 3.4–5.3)
Sodium: 139 (ref 137–147)

## 2019-12-23 LAB — CBC AND DIFFERENTIAL
HCT: 27 — AB (ref 36–46)
Hemoglobin: 8.7 — AB (ref 12.0–16.0)
Neutrophils Absolute: 4.84
Platelets: 131 — AB (ref 150–399)
WBC: 6.2

## 2019-12-23 LAB — HEPATIC FUNCTION PANEL
ALT: 24 (ref 7–35)
AST: 31 (ref 13–35)
Alkaline Phosphatase: 94 (ref 25–125)
Bilirubin, Total: 0.5

## 2019-12-23 NOTE — Chronic Care Management (AMB) (Signed)
Chronic Care Management Pharmacy Assistant   Name: Linda [name]  MRN: [id number] DOB: 1970-05-24  Reason for Encounter: Medication Review  Patient Questions:  1.  Have you seen any other providers since your last visit? Yes 12/23/19 Dr. [name]- Oncology; no changes    2.  Any changes in your medicines or health? No    PCP : [name], MD  Allergies:   Allergies  Allergen Reactions  . Codeine Shortness Of Breath  . Celecoxib Other (See Comments)    Unknown reaction  . Ezetimibe-Simvastatin Other (See Comments)    Unknown reaction  . Propranolol Hcl Other (See Comments)    Unknown reaction    Medications: Outpatient Encounter Medications as of 12/23/2019  Medication Sig Note  . Blood Glucose Monitoring Suppl (ONETOUCH VERIO REFLECT) w/Device KIT AS DIRECTED   . buPROPion (WELLBUTRIN XL) 300 MG 24 hr tablet TAKE ONE TABLET BY MOUTH EVERY EVENING   . calcium citrate-vitamin D (CITRACAL+D) 315-200 MG-UNIT tablet Take 1 tablet by mouth 2 (two) times daily.   . cephALEXin (KEFLEX) 500 MG capsule Take 500 mg by mouth 2 (two) times daily.   . clotrimazole-betamethasone (LOTRISONE) cream Apply small amount to affected area twice daily (Patient taking differently: as needed. Apply small amount to affected area twice daily)   . Continuous Blood Gluc Receiver ([place]) DEVI 1 each by Does not apply route in the morning, at noon, and at bedtime.   . Continuous Blood Gluc Sensor (DEXCOM G6 SENSOR) MISC Check sugars qac and qhs.   . Continuous Blood Gluc Transmit (DEXCOM G6 TRANSMITTER) MISC 1 each by Does not apply route every 3 (three) months.   . cyclobenzaprine (FLEXERIL) 10 MG tablet Take 10 mg by mouth every 8 (eight) hours as needed.   Marland Kitchen dexamethasone (DECADRON) 4 MG tablet Take 4 mg by mouth 2 (two) times daily with a meal.   . dicyclomine (BENTYL) 20 MG tablet TAKE ONE TABLET BY MOUTH BEFORE MEALS AND AT BEDTIME AS NEEDED FOR STOMACH CRAMPING     . Dulaglutide (TRULICITY) 2.09 OB/0.9GG SOPN INJECT ONE SYRINGE ONCE A WEEK   . Empagliflozin-Linaglip-Metform 5-2.05-998 MG TB24 Take 1,000 mg by mouth daily.   . famotidine (PEPCID) 20 MG tablet Take 1 tablet (20 mg total) by mouth 2 (two) times daily.   . ferrous sulfate 325 (65 FE) MG tablet Take 325 mg by mouth every evening.   . gabapentin (NEURONTIN) 300 MG capsule Take 1 capsule (300 mg total) by mouth 3 (three) times daily.   Marland Kitchen glucose blood test strip 1 each by Other route in the morning and at bedtime. One Touch Verio - Patient checks blood sugar 2-3 times daily.   . insulin aspart (NOVOLOG FLEXPEN) 100 UNIT/ML FlexPen Three times before meals while on prednisone.   . Insulin Pen Needle (BD PEN NEEDLE NANO U/F) 32G X 4 MM MISC Inject 1 each into the skin in the morning and at bedtime.   Marland Kitchen LANTUS SOLOSTAR 100 UNIT/ML Solostar Pen INJECT 68 UNITS SUBCUTANEOUSLY EVERYDAY AT BEDTIME   . Levomilnacipran HCl ER (FETZIMA) 80 MG CP24 Take 1 capsule by mouth daily.   Marland Kitchen levothyroxine (SYNTHROID) 75 MCG tablet TAKE ONE TABLET BY MOUTH ONCE DAILY   . loratadine (CLARITIN) 10 MG tablet Take 10 mg by mouth daily. Taking 3-4 days each week due to bone pain associated with shot from cancer center.   Marland Kitchen LORazepam (ATIVAN) 0.5 MG tablet TAKE ONE TABLET BY MOUTH  TWICE DAILY AS NEEDED FOR ANXIETY   . losartan (COZAAR) 50 MG tablet TAKE ONE TABLET BY MOUTH EVERY EVENING   . morphine (MS CONTIN) 30 MG 12 hr tablet Take 1 tablet (30 mg total) by mouth every 12 (twelve) hours.   . Multiple Vitamin (MULTIVITAMIN WITH MINERALS) TABS tablet Take 1 tablet by mouth daily.  06/08/2018: Pt plans on purchasing again when she can get out.  . ondansetron (ZOFRAN) 4 MG tablet TAKE ONE TABLET BY MOUTH FOUR TIMES DAILY AS NEEDED FOR NAUSEA   . OneTouch Delica Lancets 29N MISC 1 each by Does not apply route daily before breakfast. Check blood sugar twice daily.   . pantoprazole (PROTONIX) 40 MG tablet Take 1 tablet (40 mg  total) by mouth 2 (two) times daily.   . Pegfilgrastim-jmdb (FULPHILA [place]) Inject into the skin.   . potassium chloride (MICRO-K) 10 MEQ CR capsule TAKE TWO CAPSULES BY MOUTH TWICE DAILY   . pravastatin (PRAVACHOL) 40 MG tablet Take 1 tablet (40 mg total) by mouth daily.   . prochlorperazine (COMPAZINE) 10 MG tablet Take 10 mg by mouth every 6 (six) hours as needed for nausea or vomiting.   . senna (SENOKOT) 8.6 MG tablet Take 1 tablet by mouth daily as needed for constipation.   Marland Kitchen Specialty Vitamins Products (MAGNESIUM, AMINO ACID CHELATE,) 133 MG tablet Take 1 tablet by mouth at bedtime.   . sucralfate (CARAFATE) 1 g tablet Take 1 tablet (1 g total) by mouth 4 (four) times daily -  with meals and at bedtime.   Marland Kitchen SYNJARDY 12.05-998 MG TABS TAKE ONE TABLET BY MOUTH ONCE DAILY   . Vitamin D, Ergocalciferol, (DRISDOL) 1.25 MG (50000 UNIT) CAPS capsule TAKE ONE CAPSULE BY MOUTH ONCE WEEKLY ON FRIDAY   . zolpidem (AMBIEN) 10 MG tablet TAKE ONE TABLET BY MOUTH EVERYDAY AT BEDTIME AS NEEDED    No facility-administered encounter medications on file as of 12/23/2019.    Current Diagnosis: Patient Active Problem List   Diagnosis Date Noted  . Malignant neoplasm of lower-inner quadrant of right breast of female, estrogen receptor negative ([place]) 10/01/2019  . Chronic pain syndrome 07/08/2019  . Memory loss 07/08/2019  . Depression, major, recurrent, mild ([place]) 07/08/2019  . Uncomplicated opioid dependence ([place]) 07/08/2019  . Mixed hyperlipidemia 04/11/2019  . Dyslipidemia associated with type 2 diabetes mellitus ([place]) 04/11/2019  . Essential hypertension, benign 04/11/2019  . Major depressive disorder, single episode, mild ([place]) 04/11/2019  . Acquired hypothyroidism 04/11/2019  . Vitamin D insufficiency 04/11/2019  . Class 2 severe obesity due to excess calories with serious comorbidity and body mass index (BMI) of 35.0 to 35.9 in adult ([place]) 04/11/2019  . Pre-ulcerative calluses 03/04/2019  .  Liver cirrhosis secondary to NASH (nonalcoholic steatohepatitis) ([place]) 09/13/2018    Class: Chronic  . BRCA1 positive 06/14/2016   Reviewed chart for medication changes ahead of medication coordination call.   OVs -12/23/19 Dr. [name]- Oncology; no changes since last care coordination call/Pharmacist visit.   No medication changes indicated.  BP Readings from Last 3 Encounters:  12/23/19 (!) 150/67  12/23/19 (!) 150/67  12/06/19 112/62    Lab Results  Component Value Date   HGBA1C 6.9 (H) 10/09/2019     Patient obtains medications through Vials  30 Days   Last adherence delivery included:  Synjardy 12.05-998 mg- 1 tablet daily Morphine Sulfate 30 mg -2 times daily PRN Zolpidem 10 mg- 1 tablet as needed at bedtime Lantus Solostar U 100- inject  0.68 mL into skin at bedtime Dicyclomine 20 mg- 1 tablet as needed before meals and bedtime Pantoprazole 40 mg- 1 tablet twice daily Methocarbamol 500 mg- 1 tablet twice daily Losartan Potassium 50 mg- 1 tablet in evening Famotidine 20 mg- 1 tablet twice daily Gabapentin 300 mg- 1 capsule twice daily Pravastatin 20 mg- 1 tablet at bedtime Bupropion Hcl Xl 300 mg- 1 tablet in evening Ergocalciferol 1250 mcg- 1 capsule weekly on Friday's Potassium chloride ER 10 meq- 2 capsules twice daily Levothyroxine 75 mcg- 1 tablet daily Trulicity- inject 0.5 mg sq weekly Fetzima-80 mg- 1 capsule daily Ondansetron 4 mg- 1 tablet four times a day as needed for nausea  Lorazepam 0.5 mg- 1 tablet twice a day as needed  Patient declined the following medications last month. Dicyclomine 20 mg due to PRN use, Methocarbamol 500 mg due to being discontinued by Dr Hinton Rao and replacing with Cyclobenzaprine(per patient), Dexamethasone 4 mg due to once treatment- patient has completed.     Patient is due for next adherence delivery on: 12/29/2019  Called patient and reviewed medications and coordinated delivery.  This delivery  to include: Potassium chloride ER 10 meq- 2 capsules twice daily Vitamin D 50,000 units - 1 capsule weekly on Fridays. Bupropion Hcl Xl 300 mg- 1 tablet in evening Levothyroxine 75 mcg- 1 tablet daily Losartan Potassium 50 mg- 1 tablet in evening Cyclobenzaprine 10 mg - 1 tablet by mouth every 8 hours as needed.  One touch verio test strips- Use 1 strip to test in the morning and at bedtime 2-3 times a day. Compazine 10 mg- 1 tablet by mouth every 6 hours for nausea. Lorazepam 0.5 mg - 1 tablet twice a day as needed Lantus Solostar U 100- inject 0.68 mL into skin at bedtime Morphine Sulfate 30 mg -2 times daily PRN Zolpidem 10 mg- 1 tablet as needed at bedtime Gabapentin 300 mg- 1 capsule twice daily Ondansetron 4 mg- 1 tablet four times a day as needed for nausea  Famotidine 20 mg- 1 tablet twice daily Pravastatin 20 mg- 1 tablet at bedtime Fetzima-80 mg- 1 capsule daily Synjardy 12.05-998 mg- 1 tablet daily Prevident 5000 - Brush 1 bead gently on teeth for 2 minutes twice daily Sucralfate 1 gram- 1 tablet four times a day with meals and at bedtime.   Patient does not need a short fill or acute fill of medications prior to adherence delivery.     Patient declined the following medications Novolog - received 90 ds on 12/04/19 Dicyclomine 20 mg- Uses PRN has plenty on hand. Trulicity- States she has 2 months left Pantoprazole 40 mg- States she has 1 month left Dexamethasone 4 mg- States she has completed course Methocarbamol 500 mg- States she no longer takes. Takes flexeril instead.  Pen Needles- Has plenty on hand Lancets- Has plenty on hand.    Patient does not need any refills of medications at this time.   Confirmed delivery date of 12/27/2019, advised patient that pharmacy will contact them the morning of delivery.  Follow-Up:  Coordination of [facility] Pharmacy Services   [facility] Shark notified  Margaretmary Dys, Donna Assistant [phone]

## 2019-12-23 NOTE — Telephone Encounter (Signed)
Per 11/29 LOS, patient scheduled for 12/20 Labs, Follow Up with [facility]. Also, scheduled 12/14 Infusion, 12/16 Injection.  Gave patient Appt Summary

## 2019-12-24 ENCOUNTER — Ambulatory Visit: Payer: PPO | Admitting: Family Medicine

## 2019-12-24 ENCOUNTER — Inpatient Hospital Stay: Payer: PPO

## 2019-12-24 ENCOUNTER — Other Ambulatory Visit: Payer: Self-pay

## 2019-12-24 VITALS — BP 134/64 | HR 114 | Temp 98.9°F | Resp 18 | Ht 62.0 in | Wt 220.2 lb

## 2019-12-24 DIAGNOSIS — Z5111 Encounter for antineoplastic chemotherapy: Secondary | ICD-10-CM | POA: Diagnosis not present

## 2019-12-24 DIAGNOSIS — Z171 Estrogen receptor negative status [ER-]: Secondary | ICD-10-CM

## 2019-12-24 MED ORDER — HEPARIN SOD (PORK) LOCK FLUSH 100 UNIT/ML IV SOLN
500.0000 [IU] | Freq: Once | INTRAVENOUS | Status: AC | PRN
  Administered 2019-12-24: 500 [IU]
  Filled 2019-12-24: qty 5

## 2019-12-24 MED ORDER — SODIUM CHLORIDE 0.9 % IV SOLN
600.0000 mg/m2 | Freq: Once | INTRAVENOUS | Status: AC
  Administered 2019-12-24: 1240 mg via INTRAVENOUS
  Filled 2019-12-24: qty 62

## 2019-12-24 MED ORDER — SODIUM CHLORIDE 0.9% FLUSH
10.0000 mL | INTRAVENOUS | Status: DC | PRN
  Filled 2019-12-24: qty 10

## 2019-12-24 MED ORDER — PALONOSETRON HCL INJECTION 0.25 MG/5ML
INTRAVENOUS | Status: AC
  Filled 2019-12-24: qty 5

## 2019-12-24 MED ORDER — DEXAMETHASONE SODIUM PHOSPHATE 10 MG/ML IJ SOLN
INTRAMUSCULAR | Status: AC
  Filled 2019-12-24: qty 1

## 2019-12-24 MED ORDER — PALONOSETRON HCL INJECTION 0.25 MG/5ML
0.2500 mg | Freq: Once | INTRAVENOUS | Status: AC
  Administered 2019-12-24: 0.25 mg via INTRAVENOUS

## 2019-12-24 MED ORDER — DEXAMETHASONE SODIUM PHOSPHATE 10 MG/ML IJ SOLN
8.0000 mg | Freq: Once | INTRAMUSCULAR | Status: AC
  Administered 2019-12-24: 8 mg via INTRAVENOUS

## 2019-12-24 MED ORDER — DOXORUBICIN HCL CHEMO IV INJECTION 2 MG/ML
60.0000 mg/m2 | Freq: Once | INTRAVENOUS | Status: AC
  Administered 2019-12-24: 124 mg via INTRAVENOUS
  Filled 2019-12-24: qty 62

## 2019-12-24 MED ORDER — SODIUM CHLORIDE 0.9 % IV SOLN
150.0000 mg | Freq: Once | INTRAVENOUS | Status: AC
  Administered 2019-12-24: 150 mg via INTRAVENOUS
  Filled 2019-12-24: qty 150

## 2019-12-24 MED ORDER — SODIUM CHLORIDE 0.9 % IV SOLN
Freq: Once | INTRAVENOUS | Status: AC
  Filled 2019-12-24: qty 250

## 2019-12-24 NOTE — Progress Notes (Signed)
Patient here today for chemotherapy. Asking about a blood transfusion as she is very fatigued. She denies dyspnea, chest pain, lightheadedness. Her HgB was 8.7 and explained typically don't transfuse at that level unless other symptoms. She was fine with that. Offered to repeat labs next week to recheck. She said she would call if she develops other symptoms.

## 2019-12-24 NOTE — Progress Notes (Signed)
1335: PT STABLE AT TIME OF DISCHARGE

## 2019-12-26 ENCOUNTER — Other Ambulatory Visit: Payer: Self-pay | Admitting: Hematology and Oncology

## 2019-12-26 ENCOUNTER — Other Ambulatory Visit: Payer: Self-pay

## 2019-12-26 ENCOUNTER — Inpatient Hospital Stay: Payer: PPO | Attending: Oncology

## 2019-12-26 VITALS — BP 110/59 | HR 110 | Temp 98.9°F | Resp 18 | Ht 62.0 in | Wt 218.0 lb

## 2019-12-26 DIAGNOSIS — C50811 Malignant neoplasm of overlapping sites of right female breast: Secondary | ICD-10-CM | POA: Insufficient documentation

## 2019-12-26 DIAGNOSIS — Z171 Estrogen receptor negative status [ER-]: Secondary | ICD-10-CM

## 2019-12-26 DIAGNOSIS — C50311 Malignant neoplasm of lower-inner quadrant of right female breast: Secondary | ICD-10-CM | POA: Diagnosis not present

## 2019-12-26 DIAGNOSIS — Z6835 Body mass index (BMI) 35.0-35.9, adult: Secondary | ICD-10-CM

## 2019-12-26 DIAGNOSIS — Z5189 Encounter for other specified aftercare: Secondary | ICD-10-CM | POA: Diagnosis not present

## 2019-12-26 MED ORDER — PEGFILGRASTIM-JMDB 6 MG/0.6ML ~~LOC~~ SOSY
PREFILLED_SYRINGE | SUBCUTANEOUS | Status: AC
  Filled 2019-12-26: qty 0.6

## 2019-12-26 MED ORDER — PEGFILGRASTIM-JMDB 6 MG/0.6ML ~~LOC~~ SOSY
6.0000 mg | PREFILLED_SYRINGE | Freq: Once | SUBCUTANEOUS | Status: AC
  Administered 2019-12-26: 6 mg via SUBCUTANEOUS

## 2019-12-26 MED ORDER — PROCHLORPERAZINE MALEATE 10 MG PO TABS: 10.0000 mg | ORAL_TABLET | Freq: Four times a day (QID) | ORAL | 3 refills | Status: DC | PRN

## 2019-12-26 MED ORDER — GLUCOSE BLOOD VI STRP
1.0000 | ORAL_STRIP | Freq: Two times a day (BID) | 2 refills | Status: DC
Start: 2019-12-26 — End: 2020-04-23

## 2019-12-26 MED ORDER — ONDANSETRON HCL 4 MG PO TABS: 4.0000 mg | ORAL_TABLET | ORAL | 3 refills | Status: DC | PRN

## 2019-12-26 NOTE — Progress Notes (Signed)
Pt stable at time of discharge (1050).

## 2019-12-26 NOTE — Patient Instructions (Signed)
Fulvestrant injection What is this medicine? FULVESTRANT (ful VES trant) blocks the effects of estrogen. It is used to treat breast cancer. This medicine may be used for other purposes; ask your health care provider or pharmacist if you have questions. COMMON BRAND NAME(S): FASLODEX What should I tell my health care provider before I take this medicine? They need to know if you have any of these conditions:  bleeding disorders  liver disease  low blood counts, like low white cell, platelet, or red cell counts  an unusual or allergic reaction to fulvestrant, other medicines, foods, dyes, or preservatives  pregnant or trying to get pregnant  breast-feeding How should I use this medicine? This medicine is for injection into a muscle. It is usually given by a health care professional in a hospital or clinic setting. Talk to your pediatrician regarding the use of this medicine in children. Special care may be needed. Overdosage: If you think you have taken too much of this medicine contact a poison control center or emergency room at once. NOTE: This medicine is only for you. Do not share this medicine with others. What if I miss a dose? It is important not to miss your dose. Call your doctor or health care professional if you are unable to keep an appointment. What may interact with this medicine?  medicines that treat or prevent blood clots like warfarin, enoxaparin, dalteparin, apixaban, dabigatran, and rivaroxaban This list may not describe all possible interactions. Give your health care provider a list of all the medicines, herbs, non-prescription drugs, or dietary supplements you use. Also tell them if you smoke, drink alcohol, or use illegal drugs. Some items may interact with your medicine. What should I watch for while using this medicine? Your condition will be monitored carefully while you are receiving this medicine. You will need important blood work done while you are taking  this medicine. Do not become pregnant while taking this medicine or for at least 1 year after stopping it. Women of child-bearing potential will need to have a negative pregnancy test before starting this medicine. Women should inform their doctor if they wish to become pregnant or think they might be pregnant. There is a potential for serious side effects to an unborn child. Men should inform their doctors if they wish to father a child. This medicine may lower sperm counts. Talk to your health care professional or pharmacist for more information. Do not breast-feed an infant while taking this medicine or for 1 year after the last dose. What side effects may I notice from receiving this medicine? Side effects that you should report to your doctor or health care professional as soon as possible:  allergic reactions like skin rash, itching or hives, swelling of the face, lips, or tongue  feeling faint or lightheaded, falls  pain, tingling, numbness, or weakness in the legs  signs and symptoms of infection like fever or chills; cough; flu-like symptoms; sore throat  vaginal bleeding Side effects that usually do not require medical attention (report to your doctor or health care professional if they continue or are bothersome):  aches, pains  constipation  diarrhea  headache  hot flashes  nausea, vomiting  pain at site where injected  stomach pain This list may not describe all possible side effects. Call your doctor for medical advice about side effects. You may report side effects to FDA at 1-800-FDA-1088. Where should I keep my medicine? This drug is given in a hospital or clinic and will  not be stored at home. NOTE: This sheet is a summary. It may not cover all possible information. If you have questions about this medicine, talk to your doctor, pharmacist, or health care provider.  2020 Elsevier/Gold Standard (2017-04-20 11:34:41)

## 2019-12-27 ENCOUNTER — Other Ambulatory Visit: Payer: Self-pay

## 2019-12-27 DIAGNOSIS — Z170 Estrogen receptor positive status [ER+]: Secondary | ICD-10-CM

## 2019-12-27 DIAGNOSIS — K7581 Nonalcoholic steatohepatitis (NASH): Secondary | ICD-10-CM

## 2019-12-27 DIAGNOSIS — Z171 Estrogen receptor negative status [ER-]: Secondary | ICD-10-CM

## 2019-12-27 DIAGNOSIS — K7460 Unspecified cirrhosis of liver: Secondary | ICD-10-CM

## 2019-12-27 DIAGNOSIS — C50311 Malignant neoplasm of lower-inner quadrant of right female breast: Secondary | ICD-10-CM

## 2020-01-03 ENCOUNTER — Ambulatory Visit: Payer: PPO | Admitting: Family Medicine

## 2020-01-04 DIAGNOSIS — Z20828 Contact with and (suspected) exposure to other viral communicable diseases: Secondary | ICD-10-CM | POA: Diagnosis not present

## 2020-01-04 DIAGNOSIS — R051 Acute cough: Secondary | ICD-10-CM | POA: Diagnosis not present

## 2020-01-06 ENCOUNTER — Telehealth: Payer: Self-pay | Admitting: Hematology and Oncology

## 2020-01-06 ENCOUNTER — Inpatient Hospital Stay: Payer: PPO

## 2020-01-06 ENCOUNTER — Inpatient Hospital Stay: Payer: PPO | Admitting: Hematology and Oncology

## 2020-01-06 NOTE — Telephone Encounter (Signed)
The patient had called last week with respiratory symptoms.  She was instructed to be evaluated and treated at urgent care.  She was on my schedule today, but this was an incorrect appointment.  I contacted her to be sure that she was doing better and did not need to be seen today.  She states her COVID-19 test was negative.  She was placed on azithromycin for the respiratory infection.  She states she is still coughing, but denies fevers or chills.  She just started her antibiotic on Saturday.  She did not feel she needed to come in today to be seen.  I encouraged her to contact us if her symptoms do not improve, as she may need alternative treatment.

## 2020-01-06 NOTE — Telephone Encounter (Signed)
Per 12/13 Staff Message, Canceled today's Lab, Follow Up and keep 12/20 Labs, Follow Up Appt's.  Patient Notified

## 2020-01-07 ENCOUNTER — Inpatient Hospital Stay: Payer: PPO

## 2020-01-07 ENCOUNTER — Other Ambulatory Visit: Payer: Self-pay | Admitting: Physician Assistant

## 2020-01-07 DIAGNOSIS — E1169 Type 2 diabetes mellitus with other specified complication: Secondary | ICD-10-CM

## 2020-01-09 ENCOUNTER — Ambulatory Visit: Payer: PPO

## 2020-01-10 ENCOUNTER — Ambulatory Visit: Payer: PPO | Admitting: Family Medicine

## 2020-01-10 NOTE — Progress Notes (Signed)
[facility]  [address] [place],  [place]  [address] [phone]  Clinic Day:  01/13/2020  Referring physician: [name], MD   CHIEF COMPLAINT:  CC: Breast cancer  Current Treatment:  AC for 4 doses to be followed by weekly Taxol for 12 doses   HISTORY OF PRESENT ILLNESS:  Linda [name] is a 49 y.o. female who we began seeing in February 2018 for evaluation of anemia.  Her anemia was felt to be secondary to iron deficiency and we recommended continuation of oral iron supplementation for a total of 6 months, as well as referral back to the gastroenterologist.  The patient also has a history of thrombocytopenia and had previously seen Dr. Bobby Rumpf in 2014.  The thrombocytopenia was felt to be secondary to mild chronic immune thrombocytopenic purpura (ITP).  Due to the patient's family history of malignancy, she underwent testing for hereditary cancer syndromes with the [employer] myRisk Hereditary Cancer Panel test.  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  She underwent a robotic hysterectomy and bilateral salpingo-oophorectomy in May 2018 with Dr. Everitt Amber.  Pathology was benign.  We also discussed the option of risk reducing bilateral mastectomy, but she has chosen to have close surveillance, so we recommended annual MRI breast, in addition to mammography.  She was placed on chemoprevention with raloxifene in September 2018.  CT abdomen and pelvis in August 2020 done for bilateral flank pain, revealed a tiny left renal calculus, as well as probable hepatic cirrhosis without evidence of hepatic mass.  She was then referred to [facility] and is followed by Roosevelt Locks, CRNP in [place] for her liver cirrhosis.  She tested negative for hepatitis A, B, and C.  She has received her hepatitis  vaccines in 2020.  We saw her in September 2021 for a new diagnosis of stage IB (T1c N0 M0) triple negative right breast cancer.  She underwent screening bilateral mammogram on August 3rd which revealed possible masses in the right breast.  Diagnostic right mammogram and right breast ultrasound from August 18th confirmed suspicious masses in the right breast at 5 o'clock measuring 1.5 cm and 6 o'clock measuring 1.4 cm.  There was an indeterminate 4 mm with possible distortion in the outer right breast without sonographic correlate.  Right axillary ultrasound was normal.  She then underwent biopsies of both masses and surgical pathology from these procedures revealed  invasive ductal carcinoma, grade 3, with necrosis at 5 o'clock; and invasive ductal carcinoma, grade 1-2, with necrosis and focal myxoid change at 6 o'clock.  No DCIS was identified.  Breast prognostic profiles revealed HER2, and estrogen and progesterone receptors to be negative. Ki67 was 30% at the 5 o'clock mass, and 40% at 6 o'clock.  She underwent bilateral mastectomies on September 24th with Dr. Noberto Retort, and surgical pathology from this procedure revealed invasive ductal carcinoma, grade 3, with myxoid change, 19 mm, at 6 o'clock, and invasive ductal carcinoma, grade 3, and ductal carcinoma in situ, 19 mm, at 5 o'clock.  All margins were negative for invasive carcinoma or DCIS.  Two sentinel lymph nodes were negative for metastatic carcinoma (0/2).  CT chest, abdomen and pelvis in September did not reveal any evidence of metastatic disease.  She started dose dense AC chemotherapy on October 25th , with plans to follow this with weekly paclitaxel  for 12 doses.  She had severe restless leg syndrome with oral corticosteroids.    At her visit on November 29th prior to a 3rd cycle of AC chemotherapy she had significantly worsened anemia with a hemoglobin of 8.7, so we decided to wait 3 weeks for her 4th cycle of chemotherapy.  Iron studies, B12 and  folate were normal.  INTERVAL HISTORY:  She is here for routine follow up prior to a 4th cycle of AC chemotherapy. Since her last visit, she had a respiratory infection treated with azithromycin.  COVID-19 testing was negative.  Unfortunately, she continues to have a dry cough, especially at night with occasional temperature of  99 to 100.  She denies dyspnea or chest pain.  She is using Mucinex for her cough, which is not effective.  She states she remains weak and feels she cannot finish tasks due to fatigue.  She denies fevers or chills.   Her appetite has been decreased with the infection. Her weight is decreased 16 lb in 3 weeks.  REVIEW OF SYSTEMS:  Review of Systems  Constitutional: Positive for appetite change, fatigue and unexpected weight change. Negative for chills and fever.  HENT:   Negative for lump/mass, mouth sores and sore throat.   Respiratory: Positive for cough. Negative for hemoptysis and shortness of breath.   Cardiovascular: Negative for chest pain and leg swelling.  Gastrointestinal: Negative for abdominal pain, constipation, diarrhea and nausea.  Genitourinary: Negative for difficulty urinating, dysuria, frequency and hematuria.   Musculoskeletal: Negative for arthralgias, back pain and myalgias.  Neurological: Negative for dizziness and headaches.  Psychiatric/Behavioral: Negative for depression. The patient is not nervous/anxious.      VITALS:  Blood pressure 140/64, pulse (!) 101, temperature 98.5 F (36.9 C), temperature source Oral, resp. rate 18, height _0  (1.575 m), weight 202 lb 1.6 oz (91.7 kg), last menstrual period 06/13/2009, SpO2 97 %.  Wt Readings from Last 3 Encounters:  01/13/20 202 lb 1.6 oz (91.7 kg)  12/26/19 218 lb (98.9 kg)  12/24/19 220 lb 4 oz (99.9 kg)    Body mass index is 36.96 kg/m.  Performance status (ECOG): 2 - Symptomatic, <50% confined to bed  PHYSICAL EXAM:  Physical Exam Vitals and nursing note reviewed.  Constitutional:       General: She is not in acute distress.    Appearance: Normal appearance. She is ill-appearing (mildly weak appearing).  HENT:     Mouth/Throat:     Mouth: Mucous membranes are moist.     Pharynx: Oropharynx is clear. No oropharyngeal exudate or posterior oropharyngeal erythema.  Eyes:     General: No scleral icterus.    Extraocular Movements: Extraocular movements intact.     Conjunctiva/sclera: Conjunctivae normal.     Pupils: Pupils are equal, round, and reactive to light.  Cardiovascular:     Rate and Rhythm: Normal rate and regular rhythm.     Heart sounds: Normal heart sounds. No murmur heard. No friction rub. No gallop.   Pulmonary:     Effort: No respiratory distress.     Breath sounds: Normal breath sounds. No stridor. No wheezing, rhonchi or rales.  Chest:  Breasts:     Right: No axillary adenopathy or supraclavicular adenopathy.     Left: No axillary adenopathy or supraclavicular adenopathy.      Comments: Breast exam is deferred. Abdominal:     General: There is no distension.     Palpations: Abdomen is soft. There is no hepatomegaly, splenomegaly or  mass.     Tenderness: There is no abdominal tenderness. There is no guarding.     Hernia: No hernia is present. There is no hernia in the left inguinal area or right inguinal area.  Musculoskeletal:     Right lower leg: No edema.     Left lower leg: No edema.  Lymphadenopathy:     Upper Body:     Right upper body: No supraclavicular or axillary adenopathy.     Left upper body: No supraclavicular or axillary adenopathy.  Skin:    General: Skin is warm.     Coloration: Skin is not jaundiced.     Findings: No rash.  Neurological:     General: No focal deficit present.     Mental Status: She is alert and oriented to person, place, and time.     Cranial Nerves: No cranial nerve deficit.  Psychiatric:        Mood and Affect: Mood normal.        Behavior: Behavior normal.     LABS:   CBC Latest Ref Rng & Units  01/13/2020 12/23/2019 12/02/2019  WBC - 6.3 6.2 6.9  Hemoglobin 12.0 - 16.0 9.6(A) 8.7(A) 10.9(A)  Hematocrit 36 - 46 30(A) 27(A) 35(A)  Platelets 150 - 399 157 131(A) 97(A)   CMP Latest Ref Rng & Units 01/13/2020 12/23/2019 12/02/2019  Glucose 70 - 99 mg/dL - - -  BUN 4 - _0 Creatinine 0.5 - 1.1 0.8 0.9 0.8  Sodium 137 - 147 137 139 141  Potassium 3.4 - 5.3 4.1 3.9 3.6  Chloride 99 - 108 101 99 105  CO2 13 - 22 25(A) 29(A) 28(A)  Calcium 8.7 - 10.7 9.8 9.0 9.8  Total Protein 6.5 - 8.1 g/dL - - -  Total Bilirubin 0.3 - 1.2 mg/dL - - -  Alkaline Phos 25 - 125 106 94 114  AST 13 - 35 23 31 98(A)  ALT 7 - 35 35 24 87(A)    STUDIES:  No results found.   Allergies:  Allergies  Allergen Reactions  . Codeine Shortness Of Breath  . Celecoxib Other (See Comments)    Unknown reaction  . Ezetimibe-Simvastatin Other (See Comments)    Unknown reaction  . Propranolol Hcl Other (See Comments)    Unknown reaction    Current Medications: Current Outpatient Medications  Medication Sig Dispense Refill  . benzonatate (TESSALON) 100 MG capsule Take 1 capsule (100 mg total) by mouth 3 (three) times daily as needed for cough. 30 capsule 0  . Blood Glucose Monitoring Suppl (ONETOUCH VERIO REFLECT) w/Device KIT AS DIRECTED    . buPROPion (WELLBUTRIN XL) 300 MG 24 hr tablet TAKE ONE TABLET BY MOUTH EVERY EVENING 90 tablet 1  . calcium citrate-vitamin D (CITRACAL+D) 315-200 MG-UNIT tablet Take 1 tablet by mouth 2 (two) times daily.    . cephALEXin (KEFLEX) 500 MG capsule Take 500 mg by mouth 2 (two) times daily.    . clotrimazole-betamethasone (LOTRISONE) cream Apply small amount to affected area twice daily (Patient taking differently: as needed. Apply small amount to affected area twice daily) 45 g 0  . Continuous Blood Gluc Receiver ([place]) DEVI 1 each by Does not apply route in the morning, at noon, and at bedtime. 1 each 3  . Continuous Blood Gluc Sensor (DEXCOM G6 SENSOR)  MISC Check sugars qac and qhs. 9 each 3  . Continuous Blood Gluc Transmit (DEXCOM G6 TRANSMITTER) MISC 1 each by  Does not apply route every 3 (three) months. 1 each 3  . cyclobenzaprine (FLEXERIL) 10 MG tablet Take 10 mg by mouth every 8 (eight) hours as needed.    Marland Kitchen dexamethasone (DECADRON) 4 MG tablet Take 4 mg by mouth 2 (two) times daily with a meal.    . dicyclomine (BENTYL) 20 MG tablet TAKE ONE TABLET BY MOUTH BEFORE MEALS AND AT BEDTIME AS NEEDED FOR STOMACH CRAMPING 180 tablet 1  . Dulaglutide (TRULICITY) 6.54 YT/0.3TW SOPN INJECT ONE SYRINGE ONCE A WEEK 6 mL 1  . Empagliflozin-Linaglip-Metform 5-2.05-998 MG TB24 Take 1,000 mg by mouth daily.    . famotidine (PEPCID) 20 MG tablet Take 1 tablet (20 mg total) by mouth 2 (two) times daily. 180 tablet 1  . ferrous sulfate 325 (65 FE) MG tablet Take 325 mg by mouth every evening.    . gabapentin (NEURONTIN) 300 MG capsule Take 1 capsule (300 mg total) by mouth 3 (three) times daily. 270 capsule 0  . glucose blood test strip 1 each by Other route in the morning and at bedtime. One Touch Verio - Patient checks blood sugar 2-3 times daily. 100 each 2  . insulin aspart (NOVOLOG FLEXPEN) 100 UNIT/ML FlexPen Three times before meals while on prednisone. 15 mL 11  . Insulin Pen Needle (BD PEN NEEDLE NANO U/F) 32G X 4 MM MISC Inject 1 each into the skin in the morning and at bedtime. 200 each 3  . LANTUS SOLOSTAR 100 UNIT/ML Solostar Pen INJECT 68 UNITS SUBCUTANEOUSLY EVERYDAY AT BEDTIME 15 mL 2  . Levomilnacipran HCl ER (FETZIMA) 80 MG CP24 Take 1 capsule by mouth daily. 90 capsule 1  . levothyroxine (SYNTHROID) 75 MCG tablet TAKE ONE TABLET BY MOUTH ONCE DAILY 90 tablet 1  . loratadine (CLARITIN) 10 MG tablet Take 10 mg by mouth daily. Taking 3-4 days each week due to bone pain associated with shot from cancer center.    Marland Kitchen LORazepam (ATIVAN) 0.5 MG tablet TAKE ONE TABLET BY MOUTH TWICE DAILY AS NEEDED FOR ANXIETY 30 tablet 2  . losartan (COZAAR) 50  MG tablet TAKE ONE TABLET BY MOUTH EVERY EVENING 90 tablet 1  . morphine (MS CONTIN) 30 MG 12 hr tablet Take 1 tablet (30 mg total) by mouth every 12 (twelve) hours. 180 tablet 0  . Multiple Vitamin (MULTIVITAMIN WITH MINERALS) TABS tablet Take 1 tablet by mouth daily.     . ondansetron (ZOFRAN) 4 MG tablet TAKE ONE TABLET BY MOUTH FOUR TIMES DAILY AS NEEDED FOR NAUSEA 30 tablet 1  . ondansetron (ZOFRAN) 4 MG tablet Take 1 tablet (4 mg total) by mouth every 4 (four) hours as needed for nausea. 90 tablet 3  . OneTouch Delica Lancets 65K MISC 1 each by Does not apply route daily before breakfast. Check blood sugar twice daily. 100 each 3  . pantoprazole (PROTONIX) 40 MG tablet Take 1 tablet (40 mg total) by mouth 2 (two) times daily. 180 tablet 1  . Pegfilgrastim-jmdb (FULPHILA [place]) Inject into the skin.    . potassium chloride (MICRO-K) 10 MEQ CR capsule TAKE TWO CAPSULES BY MOUTH TWICE DAILY 180 capsule 1  . pravastatin (PRAVACHOL) 20 MG tablet Take 20 mg by mouth daily.    . pravastatin (PRAVACHOL) 40 MG tablet Take 1 tablet (40 mg total) by mouth daily. 90 tablet 0  . prochlorperazine (COMPAZINE) 10 MG tablet Take 10 mg by mouth every 6 (six) hours as needed for nausea or vomiting.    . prochlorperazine (  COMPAZINE) 10 MG tablet Take 1 tablet (10 mg total) by mouth every 6 (six) hours as needed for nausea or vomiting. 90 tablet 3  . senna (SENOKOT) 8.6 MG tablet Take 1 tablet by mouth daily as needed for constipation.    . SODIUM FLUORIDE 5000 SENSITIVE 1.1-5 % GEL Take by mouth 2 (two) times daily.    Marland Kitchen Specialty Vitamins Products (MAGNESIUM, AMINO ACID CHELATE,) 133 MG tablet Take 1 tablet by mouth at bedtime.    . sucralfate (CARAFATE) 1 g tablet Take 1 tablet (1 g total) by mouth 4 (four) times daily -  with meals and at bedtime. 270 tablet 1  . SYNJARDY 12.05-998 MG TABS TAKE ONE TABLET BY MOUTH ONCE DAILY 90 tablet 0  . Vitamin D, Ergocalciferol, (DRISDOL) 1.25 MG (50000 UNIT) CAPS capsule  TAKE ONE CAPSULE BY MOUTH ONCE WEEKLY ON FRIDAY 12 capsule 1  . zolpidem (AMBIEN) 10 MG tablet TAKE ONE TABLET BY MOUTH EVERYDAY AT BEDTIME AS NEEDED 90 tablet 0   No current facility-administered medications for this visit.     ASSESSMENT & PLAN:   Assessment:   1. Positive BRCA 1 mutation, which increases her risk for breast and ovarian cancer, as well as elevates her risk for pancreatic cancer, for which she underwent hysterectomy/bilateral salpingo-oophorectomy and is on chemoprevention with raloxifene and regular surveillance.   She declined bilateral prophylactic mastectomies.  2.  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  She has now undergone bilateral mastectomies.  She started [facility] chemotherapy on October 25th, we will plan for 4 doses followed by 12 weeks of weekly Taxol. 3.  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  I think this is a separate primary.   4. Thrombocytopenia which is felt to be due to chronic ITP, normalized at this time. 5. Liver cirrhosis as seen on CT imaging in August 2020.  She has been referred and is being seen by Roosevelt Locks, CRNP, at the [facility] in [place].  Hepatitis panel was negative, and she has received the appropriate vaccines.  Abdominal ultrasound from April was stable and CT imaging from September was stable.   6. Nevi of the back which need to be monitored. 7. Severe back and bilateral leg pain associated with restless legs, felt to be due to corticosteroids.  8. Anemia secondary to chemotherapy, which has improved.  Iron studies, B12 and folate were normal.   We will continue to monitor this. 9. Recent respiratory infection with persistent cough and general weakness.  I will obtain  a chest x-ray to evaluate for pneumonia.  Plan: Due to severe fatigue and persistent respiratory symptoms, I will hold her chemotherapy this week.  I will give her benzonatate for her cough.  I will plan to see her back in 1 week for re-evaluation prior to a 4th cycle of AC chemotherapy.  We will then plan to start weekly paclitaxel 3 weeks later.  She will need a prescription for her dexamethasone premedication.  The patient understands the plans discussed today and is in agreement with them.  She knows to contact our office if her symptoms do not improve.  I provided 20 minutes of face-to-face time during this this encounter and > 50% was spent counseling as  documented under my assessment and plan.    Marvia Pickles, PA-C [facility] [address] [place] [address] Dept: [phone] Dept Fax: [phone]   ADDENDUM:  Chest x-ray did not reveal any active cardiopulmonary disease.  The Port-A-Cath remained in position with the tip projecting at the superior cavoatrial junction.

## 2020-01-13 ENCOUNTER — Other Ambulatory Visit: Payer: Self-pay | Admitting: Pharmacist

## 2020-01-13 ENCOUNTER — Other Ambulatory Visit: Payer: Self-pay | Admitting: Hematology and Oncology

## 2020-01-13 ENCOUNTER — Inpatient Hospital Stay: Payer: PPO

## 2020-01-13 ENCOUNTER — Other Ambulatory Visit: Payer: Self-pay

## 2020-01-13 ENCOUNTER — Encounter: Payer: Self-pay | Admitting: Family Medicine

## 2020-01-13 ENCOUNTER — Inpatient Hospital Stay (INDEPENDENT_AMBULATORY_CARE_PROVIDER_SITE_OTHER): Payer: PPO | Admitting: Hematology and Oncology

## 2020-01-13 ENCOUNTER — Telehealth: Payer: Self-pay | Admitting: Hematology and Oncology

## 2020-01-13 ENCOUNTER — Encounter: Payer: Self-pay | Admitting: Hematology and Oncology

## 2020-01-13 VITALS — BP 140/64 | HR 101 | Temp 98.5°F | Resp 18 | Ht 62.0 in | Wt 202.1 lb

## 2020-01-13 DIAGNOSIS — Z171 Estrogen receptor negative status [ER-]: Secondary | ICD-10-CM | POA: Diagnosis not present

## 2020-01-13 DIAGNOSIS — J90 Pleural effusion, not elsewhere classified: Secondary | ICD-10-CM | POA: Diagnosis not present

## 2020-01-13 DIAGNOSIS — C50311 Malignant neoplasm of lower-inner quadrant of right female breast: Secondary | ICD-10-CM | POA: Diagnosis not present

## 2020-01-13 DIAGNOSIS — R059 Cough, unspecified: Secondary | ICD-10-CM | POA: Diagnosis not present

## 2020-01-13 LAB — BASIC METABOLIC PANEL
BUN: 10 (ref 4–21)
CO2: 25 — AB (ref 13–22)
Chloride: 101 (ref 99–108)
Creatinine: 0.8 (ref 0.5–1.1)
Glucose: 331
Potassium: 4.1 (ref 3.4–5.3)
Sodium: 137 (ref 137–147)

## 2020-01-13 LAB — IRON,TIBC AND FERRITIN PANEL
%SAT: 24.3
Iron: 73
TIBC: 300

## 2020-01-13 LAB — CBC AND DIFFERENTIAL
HCT: 30 — AB (ref 36–46)
Hemoglobin: 9.6 — AB (ref 12.0–16.0)
Neutrophils Absolute: 4.91
Platelets: 157 (ref 150–399)
WBC: 6.3

## 2020-01-13 LAB — COMPREHENSIVE METABOLIC PANEL
Albumin: 4.3 (ref 3.5–5.0)
Calcium: 9.8 (ref 8.7–10.7)

## 2020-01-13 LAB — HEPATIC FUNCTION PANEL
ALT: 35 (ref 7–35)
AST: 23 (ref 13–35)
Alkaline Phosphatase: 106 (ref 25–125)
Bilirubin, Total: 0.8

## 2020-01-13 LAB — CBC: RBC: 3.22 — AB (ref 3.87–5.11)

## 2020-01-13 MED ORDER — BENZONATATE 100 MG PO CAPS: 100.0000 mg | ORAL_CAPSULE | Freq: Three times a day (TID) | ORAL | 0 refills | Status: DC | PRN

## 2020-01-13 NOTE — Telephone Encounter (Signed)
Per 12/20 los next appt sched and given to patient

## 2020-01-14 ENCOUNTER — Inpatient Hospital Stay: Payer: PPO

## 2020-01-14 ENCOUNTER — Other Ambulatory Visit: Payer: Self-pay | Admitting: Oncology

## 2020-01-15 ENCOUNTER — Other Ambulatory Visit: Payer: Self-pay | Admitting: Family Medicine

## 2020-01-15 DIAGNOSIS — Z1501 Genetic susceptibility to malignant neoplasm of breast: Secondary | ICD-10-CM

## 2020-01-16 ENCOUNTER — Inpatient Hospital Stay: Payer: PPO

## 2020-01-19 ENCOUNTER — Other Ambulatory Visit: Payer: Self-pay | Admitting: Oncology

## 2020-01-19 DIAGNOSIS — Z171 Estrogen receptor negative status [ER-]: Secondary | ICD-10-CM

## 2020-01-19 DIAGNOSIS — C50311 Malignant neoplasm of lower-inner quadrant of right female breast: Secondary | ICD-10-CM

## 2020-01-20 ENCOUNTER — Other Ambulatory Visit: Payer: Self-pay | Admitting: Oncology

## 2020-01-20 ENCOUNTER — Other Ambulatory Visit: Payer: Self-pay

## 2020-01-20 ENCOUNTER — Other Ambulatory Visit: Payer: Self-pay | Admitting: Hematology and Oncology

## 2020-01-20 ENCOUNTER — Encounter: Payer: Self-pay | Admitting: Oncology

## 2020-01-20 ENCOUNTER — Other Ambulatory Visit: Payer: Self-pay | Admitting: Pharmacist

## 2020-01-20 ENCOUNTER — Encounter: Payer: Self-pay | Admitting: Hematology and Oncology

## 2020-01-20 ENCOUNTER — Inpatient Hospital Stay: Payer: PPO | Admitting: Oncology

## 2020-01-20 ENCOUNTER — Inpatient Hospital Stay (INDEPENDENT_AMBULATORY_CARE_PROVIDER_SITE_OTHER): Payer: PPO | Admitting: Oncology

## 2020-01-20 VITALS — BP 141/64 | HR 94 | Temp 98.5°F | Resp 18 | Ht 62.0 in | Wt 202.8 lb

## 2020-01-20 DIAGNOSIS — Z0001 Encounter for general adult medical examination with abnormal findings: Secondary | ICD-10-CM | POA: Diagnosis not present

## 2020-01-20 DIAGNOSIS — Z171 Estrogen receptor negative status [ER-]: Secondary | ICD-10-CM | POA: Diagnosis not present

## 2020-01-20 DIAGNOSIS — Z1501 Genetic susceptibility to malignant neoplasm of breast: Secondary | ICD-10-CM

## 2020-01-20 DIAGNOSIS — C50311 Malignant neoplasm of lower-inner quadrant of right female breast: Secondary | ICD-10-CM

## 2020-01-20 DIAGNOSIS — E1165 Type 2 diabetes mellitus with hyperglycemia: Secondary | ICD-10-CM

## 2020-01-20 DIAGNOSIS — R300 Dysuria: Secondary | ICD-10-CM | POA: Diagnosis not present

## 2020-01-20 DIAGNOSIS — D649 Anemia, unspecified: Secondary | ICD-10-CM | POA: Diagnosis not present

## 2020-01-20 LAB — BASIC METABOLIC PANEL
BUN: 15 (ref 4–21)
CO2: 23 — AB (ref 13–22)
Chloride: 100 (ref 99–108)
Creatinine: 0.8 (ref 0.5–1.1)
Glucose: 331
Potassium: 4 (ref 3.4–5.3)
Sodium: 136 — AB (ref 137–147)

## 2020-01-20 LAB — COMPREHENSIVE METABOLIC PANEL
Albumin: 3.6 (ref 3.5–5.0)
Calcium: 8.9 (ref 8.7–10.7)

## 2020-01-20 LAB — CBC AND DIFFERENTIAL
HCT: 29 — AB (ref 36–46)
Hemoglobin: 9.3 — AB (ref 12.0–16.0)
Neutrophils Absolute: 2.71
Platelets: 83 — AB (ref 150–399)
WBC: 4.3

## 2020-01-20 LAB — HEPATIC FUNCTION PANEL
ALT: 18 (ref 7–35)
AST: 25 (ref 13–35)
Alkaline Phosphatase: 92 (ref 25–125)
Bilirubin, Total: 0.8

## 2020-01-20 LAB — CBC: RBC: 3.07 — AB (ref 3.87–5.11)

## 2020-01-20 NOTE — Progress Notes (Signed)
Linda [name]  [address] [place],  [place]  [address] [phone]  Clinic Day:  01/20/2020  Referring physician: [name], MD   This document serves as a record of services personally performed by [name], MD. It was created on their behalf by Curry,Lauren E, a trained medical scribe. The creation of this record is based on the scribe's personal observations and the provider's statements to them.   CHIEF COMPLAINT:  CC: Breast cancer  Current Treatment:  AC for 4 doses to be followed by weekly Taxol for 12 doses   HISTORY OF PRESENT ILLNESS:  Linda [name] is a 49 y.o. female who we began seeing in February 2018 for evaluation of anemia.  Her anemia was felt to be secondary to iron deficiency and we recommended continuation of oral iron supplementation for a total of 6 months, as well as referral back to the gastroenterologist.  The patient also has a history of thrombocytopenia and had previously seen Dr. Bobby Rumpf in 2014.  The thrombocytopenia was felt to be secondary to mild chronic immune thrombocytopenic purpura (ITP).  Due to the patient's family history of malignancy, she underwent testing for hereditary cancer syndromes with the [employer] myRisk Hereditary Cancer Panel test.  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  She underwent a robotic hysterectomy and bilateral salpingo-oophorectomy in May 2018 with Dr. Everitt Amber.  Pathology was benign.  We also discussed the option of risk reducing bilateral mastectomy, but she has chosen to have close surveillance, so we recommended annual MRI breast, in addition to mammography.  She was placed on chemoprevention with raloxifene in September 2018.  CT abdomen and pelvis in August 2020 done for bilateral flank pain, revealed a tiny left renal calculus, as well as  probable hepatic cirrhosis without evidence of hepatic mass.  She was then referred to [place] and is followed by Roosevelt Locks, CRNP in [place] for her liver cirrhosis.  She tested negative for hepatitis A, B, and C.  She has received her hepatitis vaccines in 2020.  We saw her in September 2021 for a new diagnosis of stage IB (T1c N0 M0) triple negative right breast cancer.  She underwent screening bilateral mammogram on August 3rd which revealed possible masses in the right breast.  Diagnostic right mammogram and right breast ultrasound from August 18th confirmed suspicious masses in the right breast at 5 o'clock measuring 1.5 cm and 6 o'clock measuring 1.4 cm.  There was an indeterminate 4 mm with possible distortion in the outer right breast without sonographic correlate.  Right axillary ultrasound was normal.  She then underwent biopsies of both masses and surgical pathology from these procedures revealed  invasive ductal carcinoma, grade 3, with necrosis at 5 o'clock; and invasive ductal carcinoma, grade 1-2, with necrosis and focal myxoid change at 6 o'clock.  No DCIS was identified.  Breast prognostic profiles revealed HER2, and estrogen and progesterone receptors to be negative. Ki67 was 30% at the 5 o'clock mass, and 40% at 6 o'clock.  She underwent bilateral mastectomies on September 24th with Dr. Noberto Retort, and surgical pathology from this procedure revealed invasive ductal carcinoma, grade 3, with myxoid change, 19 mm, at 6 o'clock, and invasive ductal carcinoma, grade 3, and ductal carcinoma in situ, 19 mm, at 5 o'clock.  All margins were negative for invasive carcinoma or  DCIS.  Two sentinel lymph nodes were negative for metastatic carcinoma (0/2).  CT chest, abdomen and pelvis in September did not reveal any evidence of metastatic disease.  She started dose dense AC chemotherapy on October 25th , with plans to follow this with weekly paclitaxel for 12 doses.  She had  severe restless leg syndrome with oral corticosteroids.    At her visit on November 29th prior to a 3rd cycle of AC chemotherapy she had significantly worsened anemia with a hemoglobin of 8.7, so we decided to wait 3 weeks for her 4th cycle of chemotherapy.  Iron studies, B12 and folate were normal.  INTERVAL HISTORY:  She is here for follow up prior to a 4th cycle of AC chemotherapy.  This was held last week due to severe fatigue and persistent respiratory symptoms.  She states that her cough has resolved as of last night. Her hemoglobin is stable to mildly worse at 9.3, her platelet count has decreased from 157,000 to 83,000, and her white count is normal.  Due to the drop in her platelet count, we will need to continue to delay treatment.  Chemistries are unremarkable except for a non-fasting glucose of 331.  She continues her diabetic medications as prescribed, but advised that she follow up with Dr.Kirsten [name], especially since she will require weekly steroids during her paclitaxel chemotherapy which will start in mid January.  Her  appetite is good, and her weight is stable since her last visit.  She denies fever, chills or other signs of infection.  She denies nausea, vomiting, bowel issues, or abdominal pain.  She denies sore throat, cough, dyspnea, or chest pain.   REVIEW OF SYSTEMS:  Review of Systems  Constitutional: Positive for fatigue. Negative for fever.       Generalized weakness  HENT:  Negative.   Eyes: Negative.   Respiratory: Negative.  Negative for cough and hemoptysis.   Cardiovascular: Negative.   Gastrointestinal: Negative.   Endocrine: Negative.   Genitourinary: Negative.  Negative for dysuria, frequency and hematuria.   Skin: Negative.   Hematological: Negative.   Psychiatric/Behavioral: Negative.      VITALS:  Blood pressure (!) 141/64, pulse 94, temperature 98.5 F (36.9 C), temperature source Oral, resp. rate 18, height 5' 2"  (1.575 m), weight 202 lb 12.8 oz (92  kg), last menstrual period 06/13/2009, SpO2 97 %.  Wt Readings from Last 3 Encounters:  01/20/20 202 lb 12.8 oz (92 kg)  01/13/20 202 lb 1.6 oz (91.7 kg)  12/26/19 218 lb (98.9 kg)    Body mass index is 37.09 kg/m.  Performance status (ECOG): 1 - Symptomatic but completely ambulatory  PHYSICAL EXAM:  Physical Exam Constitutional:      General: She is not in acute distress.    Appearance: Normal appearance. She is normal weight.  HENT:     Head: Normocephalic and atraumatic.  Eyes:     General: No scleral icterus.    Extraocular Movements: Extraocular movements intact.     Conjunctiva/sclera: Conjunctivae normal.     Pupils: Pupils are equal, round, and reactive to light.  Cardiovascular:     Rate and Rhythm: Normal rate and regular rhythm.     Pulses: Normal pulses.     Heart sounds: Normal heart sounds. No murmur heard. No friction rub. No gallop.   Pulmonary:     Effort: Pulmonary effort is normal. No respiratory distress.     Breath sounds: Normal breath sounds.  Abdominal:     General:  Bowel sounds are normal. There is no distension.     Palpations: Abdomen is soft. There is no mass.     Tenderness: There is no abdominal tenderness.  Musculoskeletal:        General: Normal range of motion.     Cervical back: Normal range of motion and neck supple.     Right lower leg: No edema.     Left lower leg: No edema.  Lymphadenopathy:     Cervical: No cervical adenopathy.  Skin:    General: Skin is warm and dry.  Neurological:     General: No focal deficit present.     Mental Status: She is alert and oriented to person, place, and time. Mental status is at baseline.  Psychiatric:        Mood and Affect: Mood normal.        Behavior: Behavior normal.        Thought Content: Thought content normal.        Judgment: Judgment normal.     LABS:   CBC Latest Ref Rng & Units 01/20/2020 01/13/2020 12/23/2019  WBC - 4.3 6.3 6.2  Hemoglobin 12.0 - 16.0 9.3(A) 9.6(A) 8.7(A)   Hematocrit 36 - 46 29(A) 30(A) 27(A)  Platelets 150 - 399 83(A) 157 131(A)   CMP Latest Ref Rng & Units 01/20/2020 01/13/2020 12/23/2019  Glucose 70 - 99 mg/dL - - -  BUN 4 - 21 15 10 7   Creatinine 0.5 - 1.1 0.8 0.8 0.9  Sodium 137 - 147 136(A) 137 139  Potassium 3.4 - 5.3 4.0 4.1 3.9  Chloride 99 - 108 100 101 99  CO2 13 - 22 23(A) 25(A) 29(A)  Calcium 8.7 - 10.7 8.9 9.8 9.0  Total Protein 6.5 - 8.1 g/dL - - -  Total Bilirubin 0.3 - 1.2 mg/dL - - -  Alkaline Phos 25 - 125 92 106 94  AST 13 - 35 25 23 31   ALT 7 - 35 18 35 24    STUDIES:  No results found.   Allergies:  Allergies  Allergen Reactions  . Codeine Shortness Of Breath  . Celecoxib Other (See Comments)    Unknown reaction  . Ezetimibe-Simvastatin Other (See Comments)    Unknown reaction  . Propranolol Hcl Other (See Comments)    Unknown reaction    Current Medications: Current Outpatient Medications  Medication Sig Dispense Refill  . benzonatate (TESSALON) 100 MG capsule Take 1 capsule (100 mg total) by mouth 3 (three) times daily as needed for cough. 30 capsule 0  . Blood Glucose Monitoring Suppl (ONETOUCH VERIO REFLECT) w/Device KIT AS DIRECTED    . buPROPion (WELLBUTRIN XL) 300 MG 24 hr tablet TAKE ONE TABLET BY MOUTH EVERY EVENING 90 tablet 1  . calcium citrate-vitamin D (CITRACAL+D) 315-200 MG-UNIT tablet Take 1 tablet by mouth 2 (two) times daily.    . cephALEXin (KEFLEX) 500 MG capsule Take 500 mg by mouth 2 (two) times daily.    . clotrimazole-betamethasone (LOTRISONE) cream Apply small amount to affected area twice daily (Patient taking differently: as needed. Apply small amount to affected area twice daily) 45 g 0  . Continuous Blood Gluc Receiver ([place]) DEVI 1 each by Does not apply route in the morning, at noon, and at bedtime. 1 each 3  . Continuous Blood Gluc Sensor (DEXCOM G6 SENSOR) MISC Check sugars qac and qhs. 9 each 3  . Continuous Blood Gluc Transmit (DEXCOM G6 TRANSMITTER)  MISC 1 each by Does  not apply route every 3 (three) months. 1 each 3  . cyclobenzaprine (FLEXERIL) 10 MG tablet Take 10 mg by mouth every 8 (eight) hours as needed.    Marland Kitchen dexamethasone (DECADRON) 4 MG tablet Take 4 mg by mouth 2 (two) times daily with a meal.    . dicyclomine (BENTYL) 20 MG tablet TAKE ONE TABLET BY MOUTH BEFORE MEALS AND AT BEDTIME AS NEEDED FOR STOMACH CRAMPING 180 tablet 1  . Dulaglutide (TRULICITY) 1.66 AY/3.0ZS SOPN INJECT ONE SYRINGE ONCE A WEEK 6 mL 1  . Empagliflozin-Linaglip-Metform 5-2.05-998 MG TB24 Take 1,000 mg by mouth daily.    . famotidine (PEPCID) 20 MG tablet TAKE ONE TABLET BY MOUTH TWICE DAILY 180 tablet 1  . ferrous sulfate 325 (65 FE) MG tablet Take 325 mg by mouth every evening.    Marland Kitchen FETZIMA 80 MG CP24 TAKE ONE CAPSULE BY MOUTH ONCE DAILY 90 capsule 1  . gabapentin (NEURONTIN) 300 MG capsule Take 1 capsule (300 mg total) by mouth 3 (three) times daily. 270 capsule 0  . glucose blood test strip 1 each by Other route in the morning and at bedtime. One Touch Verio - Patient checks blood sugar 2-3 times daily. 100 each 2  . insulin aspart (NOVOLOG FLEXPEN) 100 UNIT/ML FlexPen Three times before meals while on prednisone. 15 mL 11  . Insulin Pen Needle (BD PEN NEEDLE NANO U/F) 32G X 4 MM MISC Inject 1 each into the skin in the morning and at bedtime. 200 each 3  . LANTUS SOLOSTAR 100 UNIT/ML Solostar Pen INJECT 68 UNITS SUBCUTANEOUSLY EVERYDAY AT BEDTIME 15 mL 2  . levothyroxine (SYNTHROID) 75 MCG tablet TAKE ONE TABLET BY MOUTH ONCE DAILY 90 tablet 1  . loratadine (CLARITIN) 10 MG tablet Take 10 mg by mouth daily. Taking 3-4 days each week due to bone pain associated with shot from cancer center.    Marland Kitchen LORazepam (ATIVAN) 0.5 MG tablet TAKE ONE TABLET BY MOUTH TWICE DAILY AS NEEDED FOR ANXIETY 30 tablet 2  . losartan (COZAAR) 50 MG tablet TAKE ONE TABLET BY MOUTH EVERY EVENING 90 tablet 1  . morphine (MS CONTIN) 30 MG 12 hr tablet Take 1 tablet (30 mg total) by  mouth every 12 (twelve) hours. 180 tablet 0  . Multiple Vitamin (MULTIVITAMIN WITH MINERALS) TABS tablet Take 1 tablet by mouth daily.     . ondansetron (ZOFRAN) 4 MG tablet TAKE ONE TABLET BY MOUTH FOUR TIMES DAILY AS NEEDED FOR NAUSEA 30 tablet 1  . ondansetron (ZOFRAN) 4 MG tablet Take 1 tablet (4 mg total) by mouth every 4 (four) hours as needed for nausea. 90 tablet 3  . OneTouch Delica Lancets 01U MISC 1 each by Does not apply route daily before breakfast. Check blood sugar twice daily. 100 each 3  . pantoprazole (PROTONIX) 40 MG tablet Take 1 tablet (40 mg total) by mouth 2 (two) times daily. 180 tablet 1  . Pegfilgrastim-jmdb (FULPHILA [place]) Inject into the skin.    . potassium chloride (MICRO-K) 10 MEQ CR capsule TAKE TWO CAPSULES BY MOUTH TWICE DAILY 180 capsule 1  . pravastatin (PRAVACHOL) 20 MG tablet TAKE ONE TABLET BY MOUTH EVERYDAY AT BEDTIME 90 tablet 1  . pravastatin (PRAVACHOL) 40 MG tablet Take 1 tablet (40 mg total) by mouth daily. 90 tablet 0  . prochlorperazine (COMPAZINE) 10 MG tablet Take 10 mg by mouth every 6 (six) hours as needed for nausea or vomiting.    . prochlorperazine (COMPAZINE) 10 MG tablet Take  1 tablet (10 mg total) by mouth every 6 (six) hours as needed for nausea or vomiting. 90 tablet 3  . senna (SENOKOT) 8.6 MG tablet Take 1 tablet by mouth daily as needed for constipation.    . SODIUM FLUORIDE 5000 SENSITIVE 1.1-5 % GEL Take by mouth 2 (two) times daily.    Marland Kitchen Specialty Vitamins Products (MAGNESIUM, AMINO ACID CHELATE,) 133 MG tablet Take 1 tablet by mouth at bedtime.    . sucralfate (CARAFATE) 1 g tablet Take 1 tablet (1 g total) by mouth 4 (four) times daily -  with meals and at bedtime. 270 tablet 1  . SYNJARDY 12.05-998 MG TABS TAKE ONE TABLET BY MOUTH ONCE DAILY 90 tablet 0  . Vitamin D, Ergocalciferol, (DRISDOL) 1.25 MG (50000 UNIT) CAPS capsule TAKE ONE CAPSULE BY MOUTH ONCE WEEKLY ON FRIDAY 12 capsule 1  . zolpidem (AMBIEN) 10 MG tablet TAKE ONE  TABLET BY MOUTH EVERYDAY AT BEDTIME AS NEEDED 90 tablet 0   No current facility-administered medications for this visit.     ASSESSMENT & PLAN:   Assessment:   1. Positive BRCA 1 mutation, which increases her risk for breast and ovarian cancer, as well as elevates her risk for pancreatic cancer, for which she underwent hysterectomy/bilateral salpingo-oophorectomy and is on chemoprevention with raloxifene and regular surveillance.   She declined bilateral prophylactic mastectomies.   2.  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  She has now undergone bilateral mastectomies.  She started [facility] chemotherapy on October 25th, we will plan for 4 doses followed by 12 weeks of weekly Taxol.  3.  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  I think this is a separate primary.    4. Thrombocytopenia which is felt to be due to chronic ITP, but has significantly worsened.  We will hold her therapy for 1 more week.  5. Liver cirrhosis as seen on CT imaging in August 2020.  She has been referred and is being seen by Roosevelt Locks, CRNP, at the [facility] in [place].  Hepatitis panel was negative, and she has received the appropriate vaccines.  Abdominal ultrasound from April was stable and CT imaging from September was stable.    6. Nevi of the back which need to be monitored.  7. Severe back and bilateral leg pain associated with restless legs, felt to be due to corticosteroids.   8. Anemia secondary to chemotherapy, which has improved.  Iron studies, B12 and folate were normal.   We will continue to monitor this.  9. Recent respiratory infection with persistent cough and general weakness.  Chest x-ray was negative.  Most of her symptoms have  resolved other than generalized fatigue.    Plan: Due to significant thrombocytopenia and severe fatigue, we will hold her chemotherapy for another week.  I will order a urinalysis and urine culture to rule out infection.  We will plan to see her back in 1 week with CBC and CMP for re-evaluation prior to a 4th cycle of AC chemotherapy.  We will then plan to start weekly paclitaxel 3 weeks later.  I advised that she follow up with Dr. [name] in regards to adjusting her diabetic medications, especially since she will require weekly doses of steroids.  The patient understands the plans discussed today and is in agreement with them.  She knows to contact our office if her symptoms do not improve.  I provided 30 minutes of face-to-face time during this this encounter and > 50% was spent counseling as documented under my assessment and plan.    Derwood Kaplan, MD [facility] [address] [place] [address] Dept: [phone] Dept Fax: [phone]    I, Rita Ohara, am acting as scribe for Derwood Kaplan, MD  I have reviewed this report as typed by the medical scribe, and it is complete and accurate.

## 2020-01-21 ENCOUNTER — Other Ambulatory Visit: Payer: Self-pay | Admitting: Hematology and Oncology

## 2020-01-21 ENCOUNTER — Inpatient Hospital Stay: Payer: PPO

## 2020-01-21 ENCOUNTER — Other Ambulatory Visit: Payer: Self-pay | Admitting: Oncology

## 2020-01-21 NOTE — Telephone Encounter (Addendum)
Pt notified of below per Dr Hinton Rao recommendation.Marland Kitchen  ----- Message from Derwood Kaplan, MD sent at 01/20/2020  6:31 PM EST ----- Regarding: RE: COVID booster Yes, good idea, but hold pressure on the injection site since some decrease in platelets to 83,000 ----- Message ----- From: Dairl Ponder, RN Sent: 01/20/2020   4:00 PM EST To: Derwood Kaplan, MD Subject: COVID booster                                  Pt wants to know if it would be a good idea to go ahead and get her COVID booster this week, while she is off treatment? She states she forgot to mention it to you. [phone]

## 2020-01-22 ENCOUNTER — Encounter: Payer: Self-pay | Admitting: Oncology

## 2020-01-22 ENCOUNTER — Other Ambulatory Visit: Payer: Self-pay

## 2020-01-22 ENCOUNTER — Telehealth: Payer: Self-pay

## 2020-01-22 MED ORDER — SYNJARDY 12.5-1000 MG PO TABS: 1.0000 | ORAL_TABLET | Freq: Every day | ORAL | 0 refills | Status: DC

## 2020-01-22 NOTE — Telephone Encounter (Signed)
-----   Message from Derwood Kaplan, MD sent at 01/22/2020  7:09 AM EST ----- Regarding: call pt Tell her urine cult neg

## 2020-01-22 NOTE — Chronic Care Management (AMB) (Signed)
Chronic Care Management Pharmacy Assistant   Name: Linda [name]  MRN: [id number] DOB: [birth year]  Reason for Encounter: Medication Review. Med Sync  Patient Questions:  1.  Have you seen any other providers since your last visit? Yes 01/06/20- Urgent care. 01/13/20- Rosanne Sack, [facility] 01/20/20- [name], MD- Oncology. No changes   2.  Any changes in your medicines or health? Yes 01/06/20- Placed on azithromycin for respiratory symptoms. 01/13/20- K. Mosher, PA-C started benzonatate 100 mg 3 times daily.    PCP : [name], MD  Allergies:   Allergies  Allergen Reactions   Codeine Shortness Of Breath   Celecoxib Other (See Comments)    Unknown reaction   Ezetimibe-Simvastatin Other (See Comments)    Unknown reaction   Propranolol Hcl Other (See Comments)    Unknown reaction    Medications: Outpatient Encounter Medications as of 01/22/2020  Medication Sig Note   benzonatate (TESSALON) 100 MG capsule Take 1 capsule (100 mg total) by mouth 3 (three) times daily as needed for cough.    Blood Glucose Monitoring Suppl (ONETOUCH VERIO REFLECT) w/Device KIT AS DIRECTED    buPROPion (WELLBUTRIN XL) 300 MG 24 hr tablet TAKE ONE TABLET BY MOUTH EVERY EVENING    calcium citrate-vitamin D (CITRACAL+D) 315-200 MG-UNIT tablet Take 1 tablet by mouth 2 (two) times daily.    cephALEXin (KEFLEX) 500 MG capsule Take 500 mg by mouth 2 (two) times daily.    clotrimazole-betamethasone (LOTRISONE) cream Apply small amount to affected area twice daily (Patient taking differently: as needed. Apply small amount to affected area twice daily)    Continuous Blood Gluc Receiver ([place]) DEVI 1 each by Does not apply route in the morning, at noon, and at bedtime.    Continuous Blood Gluc Sensor (DEXCOM G6 SENSOR) MISC Check sugars qac and qhs.    Continuous Blood Gluc Transmit (DEXCOM G6 TRANSMITTER) MISC 1 each by Does not apply route every 3 (three)  months.    cyclobenzaprine (FLEXERIL) 10 MG tablet Take 10 mg by mouth every 8 (eight) hours as needed.    dexamethasone (DECADRON) 4 MG tablet Take 4 mg by mouth 2 (two) times daily with a meal.    dicyclomine (BENTYL) 20 MG tablet TAKE ONE TABLET BY MOUTH BEFORE MEALS AND AT BEDTIME AS NEEDED FOR STOMACH CRAMPING    Dulaglutide (TRULICITY) 6.38 GY/6.5LD SOPN INJECT ONE SYRINGE ONCE A WEEK    Empagliflozin-Linaglip-Metform 5-2.05-998 MG TB24 Take 1,000 mg by mouth daily.    famotidine (PEPCID) 20 MG tablet TAKE ONE TABLET BY MOUTH TWICE DAILY    ferrous sulfate 325 (65 FE) MG tablet Take 325 mg by mouth every evening.    FETZIMA 80 MG CP24 TAKE ONE CAPSULE BY MOUTH ONCE DAILY    gabapentin (NEURONTIN) 300 MG capsule Take 1 capsule (300 mg total) by mouth 3 (three) times daily.    glucose blood test strip 1 each by Other route in the morning and at bedtime. One Touch Verio - Patient checks blood sugar 2-3 times daily.    insulin aspart (NOVOLOG FLEXPEN) 100 UNIT/ML FlexPen Three times before meals while on prednisone.    Insulin Pen Needle (BD PEN NEEDLE NANO U/F) 32G X 4 MM MISC Inject 1 each into the skin in the morning and at bedtime.    LANTUS SOLOSTAR 100 UNIT/ML Solostar Pen INJECT 68 UNITS SUBCUTANEOUSLY EVERYDAY AT BEDTIME    levothyroxine (SYNTHROID) 75 MCG tablet TAKE ONE TABLET BY MOUTH ONCE  DAILY    loratadine (CLARITIN) 10 MG tablet Take 10 mg by mouth daily. Taking 3-4 days each week due to bone pain associated with shot from cancer center.    LORazepam (ATIVAN) 0.5 MG tablet TAKE ONE TABLET BY MOUTH TWICE DAILY AS NEEDED FOR ANXIETY    losartan (COZAAR) 50 MG tablet TAKE ONE TABLET BY MOUTH EVERY EVENING    morphine (MS CONTIN) 30 MG 12 hr tablet Take 1 tablet (30 mg total) by mouth every 12 (twelve) hours.    Multiple Vitamin (MULTIVITAMIN WITH MINERALS) TABS tablet Take 1 tablet by mouth daily.  06/08/2018: Pt plans on purchasing again when she can get out.    ondansetron (ZOFRAN) 4 MG tablet TAKE ONE TABLET BY MOUTH FOUR TIMES DAILY AS NEEDED FOR NAUSEA    ondansetron (ZOFRAN) 4 MG tablet Take 1 tablet (4 mg total) by mouth every 4 (four) hours as needed for nausea.    OneTouch Delica Lancets 50D MISC 1 each by Does not apply route daily before breakfast. Check blood sugar twice daily.    pantoprazole (PROTONIX) 40 MG tablet Take 1 tablet (40 mg total) by mouth 2 (two) times daily.    Pegfilgrastim-jmdb (FULPHILA [place]) Inject into the skin.    potassium chloride (MICRO-K) 10 MEQ CR capsule TAKE TWO CAPSULES BY MOUTH TWICE DAILY    pravastatin (PRAVACHOL) 20 MG tablet TAKE ONE TABLET BY MOUTH EVERYDAY AT BEDTIME    pravastatin (PRAVACHOL) 40 MG tablet Take 1 tablet (40 mg total) by mouth daily.    prochlorperazine (COMPAZINE) 10 MG tablet Take 10 mg by mouth every 6 (six) hours as needed for nausea or vomiting.    prochlorperazine (COMPAZINE) 10 MG tablet Take 1 tablet (10 mg total) by mouth every 6 (six) hours as needed for nausea or vomiting.    senna (SENOKOT) 8.6 MG tablet Take 1 tablet by mouth daily as needed for constipation.    SODIUM FLUORIDE 5000 SENSITIVE 1.1-5 % GEL Take by mouth 2 (two) times daily.    Specialty Vitamins Products (MAGNESIUM, AMINO ACID CHELATE,) 133 MG tablet Take 1 tablet by mouth at bedtime.    sucralfate (CARAFATE) 1 g tablet Take 1 tablet (1 g total) by mouth 4 (four) times daily -  with meals and at bedtime.    SYNJARDY 12.05-998 MG TABS TAKE ONE TABLET BY MOUTH ONCE DAILY    Vitamin D, Ergocalciferol, (DRISDOL) 1.25 MG (50000 UNIT) CAPS capsule TAKE ONE CAPSULE BY MOUTH ONCE WEEKLY ON FRIDAY    zolpidem (AMBIEN) 10 MG tablet TAKE ONE TABLET BY MOUTH EVERYDAY AT BEDTIME AS NEEDED    No facility-administered encounter medications on file as of 01/22/2020.    Current Diagnosis: Patient Active Problem List   Diagnosis Date Noted   Malignant neoplasm of lower-inner quadrant of right breast of female,  estrogen receptor negative ([place]) 10/01/2019   Chronic pain syndrome 07/08/2019   Memory loss 07/08/2019   Depression, major, recurrent, mild ([place]) 32/67/1245   Uncomplicated opioid dependence ([place]) 07/08/2019   Mixed hyperlipidemia 04/11/2019   Dyslipidemia associated with type 2 diabetes mellitus ([place]) 04/11/2019   Essential hypertension, benign 04/11/2019   Major depressive disorder, single episode, mild ([place]) 04/11/2019   Acquired hypothyroidism 04/11/2019   Vitamin D insufficiency 04/11/2019   Class 2 severe obesity due to excess calories with serious comorbidity and body mass index (BMI) of 35.0 to 35.9 in adult ([place]) 04/11/2019   Pre-ulcerative calluses 03/04/2019   Liver cirrhosis secondary to NASH (nonalcoholic steatohepatitis) (  [place]) 09/13/2018    Class: Chronic   BRCA1 positive 06/14/2016   Reviewed chart for medication changes ahead of medication coordination call.  OVs include 01/06/20- Urgent care, 01/13/20- Rosanne Sack, [facility], 01/20/20- [name], MD- Oncology. No consults, or hospital visits since last care coordination call/Pharmacist visit.   Medication changes include short courses of azithromycin for respiratory symptoms, benzonatate 100 mg 3 times daily.  BP Readings from Last 3 Encounters:  01/20/20 (!) 141/64  01/13/20 140/64  12/26/19 (!) 110/59    Lab Results  Component Value Date   HGBA1C 6.9 (H) 10/09/2019    Unable to speak with patient   Patient obtains medications through Vials  30 Days   Last adherence delivery included: Potassium chloride ER 10 meq- 2 capsules twice daily Vitamin D 50,000 units - 1 capsule weekly on Fridays. Bupropion Hcl Xl 300 mg- 1 tablet in evening Levothyroxine 75 mcg- 1 tablet daily Losartan Potassium 50 mg- 1 tablet in evening Cyclobenzaprine 10 mg - 1 tablet by mouth every 8 hours as needed.  One touch verio test strips- Use 1 strip to test in the morning and at bedtime 2-3 times a  day. Compazine 10 mg- 1 tablet by mouth every 6 hours for nausea. Lorazepam 0.5 mg - 1 tablet twice a day as needed Lantus Solostar U 100- inject 0.41minto skin at bedtime Morphine Sulfate 30 mg -2 times daily PRN Zolpidem 10 mg- 1 tablet as needed at bedtime Gabapentin 300 mg- 1 capsule twice daily Ondansetron 4 mg- 1 tablet four times a day as needed for nausea  Famotidine 20 mg- 1 tablet twice daily Pravastatin 20 mg- 1 tablet at bedtime Fetzima-80 mg- 1 capsule daily Synjardy 12.05-998 mg- 1 tablet daily Prevident 5000 - Brush 1 bead gently on teeth for 2 minutes twice daily Sucralfate 1 gram- 1 tablet four times a day with meals and at bedtime.   Patient declined the following medications last month: Novolog - received 90 ds on 12/04/19 Dicyclomine 20 mg- Uses PRN has plenty on hand. Trulicity- States she has 2 months left Pantoprazole 40 mg- States she has 1 month left Dexamethasone 4 mg- States she has completed course Methocarbamol 500 mg- States she no longer takes. Takes flexeril instead.  Pen Needles- Has plenty on hand Lancets- Has plenty on hand.      Patient is due for next adherence delivery on: 01/23/2020.  Called patient and reviewed medications and coordinated delivery.  This delivery to include: Famotidine 20 mg- 1 tablet twice daily Fetzima-80 mg- 1 capsule daily Pravastatin 20 mg- 1 tablet at bedtime  Lantus Solostar U 100- inject 0.642mnto skin at bedtime Onetouch verio test strops use to check blood sugar 2-3 times daily Ondansetron 37m437mTake 1 tablet every 4 hours as needed for nausea Lorazepam 0.5 mg- 1 tablet twice a day as needed Sucralfate 1 gram- 1 tablet four times a day with meals and at bedtime.  Cyclobenzaprine 10 mg - 1 tablet by mouth every 8 hours as needed.  Potassium chloride ER 10 meq- 2 capsules twice daily Vitamin D 50,000 units - 1 capsule weekly on Fridays Bupropion Hcl Xl 300 mg- 1 tablet in evening Levothyroxine  75 mcg- 1 tablet daily Losartan Potassium 50 mg- 1 tablet in evening Zolpidem 10 mg- 1 tablet as needed at bedtime Gabapentin 300 mg- 1 capsule twice daily Synjardy 12.05-998 mg- 1 tablet daily Trulicity- inject 1 syringe once a week Pantoprazole 40 mg take 1 tablet twice daily  Patient declined the following medications Benzonatate 171m - Finished course Prochlorperazine Maleate 168m 1 tablet every 6 hours as needed for nausea and vomiting Prevident 5000 enamel protectant  Morphine Sulfate 30 mg -2 times daily PRN- 90 DS on 12/26/19 Cephalexin 5002mCompleted course Novalog inj flex pen- 3 month supply on 12/04/19 Dexcom Dexamethasone 4 mg- States she has completed course Methocarbamol 500 mg- States she no longer takes. Takes flexeril instead.  Pen Needles-  Lancets-  Dicyclomine 20 mg- Uses PRN has plenty on hand  Patient needs refills for Synjardy 12.5-1000mg SarMarge Duncansequested 01/22/20  Delivery date of 01/23/20, advised patient that pharmacy will contact them the morning of delivery.  Follow-Up:  Coordination of Enhanced Pharmacy Services and Pharmacist Review    Unable to get in contact with patient.   SarElly ModenaoOwens SharkPPLaurel Parktified  LinMargaretmary DysMASilver Creekarmacy Assistant [phone]

## 2020-01-23 ENCOUNTER — Telehealth: Payer: Self-pay

## 2020-01-23 ENCOUNTER — Ambulatory Visit: Payer: PPO

## 2020-01-23 ENCOUNTER — Other Ambulatory Visit: Payer: PPO

## 2020-01-23 ENCOUNTER — Inpatient Hospital Stay: Payer: PPO

## 2020-01-23 NOTE — Telephone Encounter (Signed)
-----   Message from Derwood Kaplan, MD sent at 01/22/2020  7:09 AM EST ----- Regarding: call pt Tell her urine cult neg

## 2020-01-27 ENCOUNTER — Other Ambulatory Visit: Payer: Self-pay | Admitting: Oncology

## 2020-01-27 ENCOUNTER — Inpatient Hospital Stay (INDEPENDENT_AMBULATORY_CARE_PROVIDER_SITE_OTHER): Payer: PPO | Admitting: Oncology

## 2020-01-27 ENCOUNTER — Encounter: Payer: Self-pay | Admitting: Oncology

## 2020-01-27 ENCOUNTER — Other Ambulatory Visit: Payer: PPO

## 2020-01-27 ENCOUNTER — Inpatient Hospital Stay: Payer: PPO | Attending: Oncology

## 2020-01-27 ENCOUNTER — Other Ambulatory Visit: Payer: Self-pay | Admitting: Hematology and Oncology

## 2020-01-27 ENCOUNTER — Other Ambulatory Visit: Payer: Self-pay

## 2020-01-27 ENCOUNTER — Telehealth: Payer: Self-pay | Admitting: Oncology

## 2020-01-27 DIAGNOSIS — M549 Dorsalgia, unspecified: Secondary | ICD-10-CM | POA: Diagnosis not present

## 2020-01-27 DIAGNOSIS — D693 Immune thrombocytopenic purpura: Secondary | ICD-10-CM | POA: Insufficient documentation

## 2020-01-27 DIAGNOSIS — Z5189 Encounter for other specified aftercare: Secondary | ICD-10-CM | POA: Insufficient documentation

## 2020-01-27 DIAGNOSIS — Z5111 Encounter for antineoplastic chemotherapy: Secondary | ICD-10-CM | POA: Insufficient documentation

## 2020-01-27 DIAGNOSIS — D696 Thrombocytopenia, unspecified: Secondary | ICD-10-CM

## 2020-01-27 DIAGNOSIS — Z171 Estrogen receptor negative status [ER-]: Secondary | ICD-10-CM | POA: Diagnosis not present

## 2020-01-27 DIAGNOSIS — C50811 Malignant neoplasm of overlapping sites of right female breast: Secondary | ICD-10-CM | POA: Insufficient documentation

## 2020-01-27 DIAGNOSIS — K7581 Nonalcoholic steatohepatitis (NASH): Secondary | ICD-10-CM

## 2020-01-27 DIAGNOSIS — Z1501 Genetic susceptibility to malignant neoplasm of breast: Secondary | ICD-10-CM | POA: Diagnosis not present

## 2020-01-27 DIAGNOSIS — K7460 Unspecified cirrhosis of liver: Secondary | ICD-10-CM

## 2020-01-27 DIAGNOSIS — M79605 Pain in left leg: Secondary | ICD-10-CM | POA: Diagnosis not present

## 2020-01-27 DIAGNOSIS — C50311 Malignant neoplasm of lower-inner quadrant of right female breast: Secondary | ICD-10-CM | POA: Diagnosis not present

## 2020-01-27 DIAGNOSIS — Z0001 Encounter for general adult medical examination with abnormal findings: Secondary | ICD-10-CM | POA: Diagnosis not present

## 2020-01-27 DIAGNOSIS — M79604 Pain in right leg: Secondary | ICD-10-CM | POA: Diagnosis not present

## 2020-01-27 DIAGNOSIS — D225 Melanocytic nevi of trunk: Secondary | ICD-10-CM

## 2020-01-27 DIAGNOSIS — Z79899 Other long term (current) drug therapy: Secondary | ICD-10-CM | POA: Insufficient documentation

## 2020-01-27 DIAGNOSIS — D649 Anemia, unspecified: Secondary | ICD-10-CM | POA: Diagnosis not present

## 2020-01-27 DIAGNOSIS — R5383 Other fatigue: Secondary | ICD-10-CM

## 2020-01-27 DIAGNOSIS — D6481 Anemia due to antineoplastic chemotherapy: Secondary | ICD-10-CM

## 2020-01-27 LAB — HEPATIC FUNCTION PANEL
ALT: 25 (ref 7–35)
AST: 35 (ref 13–35)
Alkaline Phosphatase: 99 (ref 25–125)
Bilirubin, Total: 0.6

## 2020-01-27 LAB — CBC AND DIFFERENTIAL
HCT: 32 — AB (ref 36–46)
Hemoglobin: 10.4 — AB (ref 12.0–16.0)
Neutrophils Absolute: 3.14
Platelets: 77 — AB (ref 150–399)
WBC: 4.9

## 2020-01-27 LAB — COMPREHENSIVE METABOLIC PANEL
Albumin: 3.8 (ref 3.5–5.0)
Calcium: 9.5 (ref 8.7–10.7)

## 2020-01-27 LAB — CBC: RBC: 3.33 — AB (ref 3.87–5.11)

## 2020-01-27 LAB — BASIC METABOLIC PANEL
BUN: 8 (ref 4–21)
CO2: 22 (ref 13–22)
Chloride: 102 (ref 99–108)
Creatinine: 0.7 (ref 0.5–1.1)
Glucose: 298
Potassium: 3.5 (ref 3.4–5.3)
Sodium: 134 — AB (ref 137–147)

## 2020-01-27 NOTE — Telephone Encounter (Signed)
Per 1/3 los next appt given to patient

## 2020-01-27 NOTE — Progress Notes (Signed)
[facility]  [address] [place],  [place]  [address] [phone]  Clinic Day:  01/27/2020  Referring physician: [name], MD   This document serves as a record of services personally performed by [name], MD. It was created on their behalf by [facility], a trained medical scribe. The creation of this record is based on the scribe's personal observations and the provider's statements to them.   CHIEF COMPLAINT:  CC: Breast cancer  Current Treatment:  AC for 4 doses to be followed by weekly Taxol for 12 doses   HISTORY OF PRESENT ILLNESS:  Linda [name] is a 50 y.o. female who we began seeing in February 2018 for evaluation of anemia.  Her anemia was felt to be secondary to iron deficiency and we recommended continuation of oral iron supplementation for a total of 6 months, as well as referral back to the gastroenterologist.  The patient also has a history of thrombocytopenia and had previously seen Dr. Bobby Rumpf in 2014.  The thrombocytopenia was felt to be secondary to mild chronic immune thrombocytopenic purpura (ITP).  Due to the patient's family history of malignancy, she underwent testing for hereditary cancer syndromes with the [employer] myRisk Hereditary Cancer Panel test.  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  She underwent a robotic hysterectomy and bilateral salpingo-oophorectomy in May 2018 with Dr. Everitt Amber.  Pathology was benign.  We also discussed the option of risk reducing bilateral mastectomy, but she has chosen to have close surveillance, so we recommended annual MRI breast, in addition to mammography.  She was placed on chemoprevention with raloxifene in September 2018.  CT abdomen and pelvis in August 2020 done for bilateral flank pain, revealed a tiny left renal calculus, as well as  probable hepatic cirrhosis without evidence of hepatic mass.  She was then referred to [facility] and is followed by Roosevelt Locks, CRNP in [place] for her liver cirrhosis.  She tested negative for hepatitis A, B, and C.  She has received her hepatitis vaccines in 2020.  We saw her in September 2021 for a new diagnosis of stage IB (T1c N0 M0) triple negative right breast cancer.  She underwent screening bilateral mammogram on August 3rd which revealed possible masses in the right breast.  Diagnostic right mammogram and right breast ultrasound from August 18th confirmed suspicious masses in the right breast at 5 o'clock measuring 1.5 cm and 6 o'clock measuring 1.4 cm.  There was an indeterminate 4 mm with possible distortion in the outer right breast without sonographic correlate.  Right axillary ultrasound was normal.  She then underwent biopsies of both masses and surgical pathology from these procedures revealed  invasive ductal carcinoma, grade 3, with necrosis at 5 o'clock; and invasive ductal carcinoma, grade 1-2, with necrosis and focal myxoid change at 6 o'clock.  No DCIS was identified.  Breast prognostic profiles revealed HER2, and estrogen and progesterone receptors to be negative. Ki67 was 30% at the 5 o'clock mass, and 40% at 6 o'clock.  She underwent bilateral mastectomies on September 24th with Dr. Noberto Retort, and surgical pathology from this procedure revealed invasive ductal carcinoma, grade 3, with myxoid change, 19 mm, at 6 o'clock, and invasive ductal carcinoma, grade 3, and ductal carcinoma in situ, 19 mm, at 5 o'clock.  All margins were negative for invasive carcinoma or  DCIS.  Two sentinel lymph nodes were negative for metastatic carcinoma (0/2).  CT chest, abdomen and pelvis in September did not reveal any evidence of metastatic disease.  She started dose dense AC chemotherapy on October 25th , with plans to follow this with weekly paclitaxel for 12 doses.  She had  severe restless leg syndrome with oral corticosteroids.    At her visit on November 29th prior to a 3rd cycle of AC chemotherapy she had significantly worsened anemia with a hemoglobin of 8.7, so we decided to wait 3 weeks for her 4th cycle of chemotherapy.  However, he platelet count dropped to 83,000 and so it was postponed an additional week.  Iron studies, B12 and folate were normal.  INTERVAL HISTORY:  She is here for follow up prior to a 4th and final cycle of AC chemotherapy scheduled for tomorrow.  This has been held for the past two weeks, first due to severe fatigue and persistent respiratory symptoms, then due worsening thrombocytopenia.  Her hemoglobin has increased from 9.3 to 10.4, her platelet count has mildly decreased from 83,000 to 77,000, and her white count is normal.  Chemistries are unremarkable except for a blood glucose of 298.  Her  appetite is good, and her weight is stable since her last visit.  She denies fever, chills or other signs of infection.  She denies nausea, vomiting, bowel issues, or abdominal pain.  She denies sore throat, cough, dyspnea, or chest pain.  REVIEW OF SYSTEMS:  Review of Systems  Constitutional: Negative.   HENT:  Negative.   Eyes: Negative.   Respiratory: Negative.   Cardiovascular: Negative.   Gastrointestinal: Negative.   Endocrine: Negative.   Genitourinary: Negative.    Musculoskeletal: Negative.   Skin: Negative.   Neurological: Negative.   Hematological: Negative.   Psychiatric/Behavioral: Negative.      VITALS:  Blood pressure 130/60, pulse (!) 103, temperature 98.9 F (37.2 C), temperature source Oral, resp. rate 18, height 5' 2"  (1.575 m), weight 203 lb 4.8 oz (92.2 kg), last menstrual period 06/13/2009, SpO2 96 %.  Wt Readings from Last 3 Encounters:  01/27/20 203 lb 4.8 oz (92.2 kg)  01/20/20 202 lb 12.8 oz (92 kg)  01/13/20 202 lb 1.6 oz (91.7 kg)    Body mass index is 37.18 kg/m.  Performance status (ECOG): 1 -  Symptomatic but completely ambulatory  PHYSICAL EXAM:  Physical Exam was deferred as she was evaluated last week.  LABS:   CBC Latest Ref Rng & Units 01/27/2020 01/20/2020 01/13/2020  WBC - 4.9 4.3 6.3  Hemoglobin 12.0 - 16.0 10.4(A) 9.3(A) 9.6(A)  Hematocrit 36 - 46 32(A) 29(A) 30(A)  Platelets 150 - 399 77(A) 83(A) 157   CMP Latest Ref Rng & Units 01/27/2020 01/20/2020 01/13/2020  Glucose 70 - 99 mg/dL - - -  BUN 4 - 21 8 15 10   Creatinine 0.5 - 1.1 0.7 0.8 0.8  Sodium 137 - 147 134(A) 136(A) 137  Potassium 3.4 - 5.3 3.5 4.0 4.1  Chloride 99 - 108 102 100 101  CO2 13 - 22 22 23(A) 25(A)  Calcium 8.7 - 10.7 9.5 8.9 9.8  Total Protein 6.5 - 8.1 g/dL - - -  Total Bilirubin 0.3 - 1.2 mg/dL - - -  Alkaline Phos 25 - 125 99 92 106  AST 13 - 35 35 25 23  ALT 7 - 35 25 18 35    STUDIES:  No results found.   Allergies:  Allergies  Allergen  Reactions  . Codeine Shortness Of Breath  . Celecoxib Other (See Comments)    Unknown reaction  . Ezetimibe-Simvastatin Other (See Comments)    Unknown reaction  . Propranolol Hcl Other (See Comments)    Unknown reaction    Current Medications: Current Outpatient Medications  Medication Sig Dispense Refill  . benzonatate (TESSALON) 100 MG capsule Take 1 capsule (100 mg total) by mouth 3 (three) times daily as needed for cough. 30 capsule 0  . Blood Glucose Monitoring Suppl (ONETOUCH VERIO REFLECT) w/Device KIT AS DIRECTED    . buPROPion (WELLBUTRIN XL) 300 MG 24 hr tablet TAKE ONE TABLET BY MOUTH EVERY EVENING 90 tablet 1  . calcium citrate-vitamin D (CITRACAL+D) 315-200 MG-UNIT tablet Take 1 tablet by mouth 2 (two) times daily.    . cephALEXin (KEFLEX) 500 MG capsule Take 500 mg by mouth 2 (two) times daily.    . clotrimazole-betamethasone (LOTRISONE) cream Apply small amount to affected area twice daily (Patient taking differently: as needed. Apply small amount to affected area twice daily) 45 g 0  . Continuous Blood Gluc Receiver  ([place]) DEVI 1 each by Does not apply route in the morning, at noon, and at bedtime. 1 each 3  . Continuous Blood Gluc Sensor (DEXCOM G6 SENSOR) MISC Check sugars qac and qhs. 9 each 3  . Continuous Blood Gluc Transmit (DEXCOM G6 TRANSMITTER) MISC 1 each by Does not apply route every 3 (three) months. 1 each 3  . cyclobenzaprine (FLEXERIL) 10 MG tablet Take 10 mg by mouth every 8 (eight) hours as needed.    Marland Kitchen dexamethasone (DECADRON) 4 MG tablet Take 4 mg by mouth 2 (two) times daily with a meal.    . dicyclomine (BENTYL) 20 MG tablet TAKE ONE TABLET BY MOUTH BEFORE MEALS AND AT BEDTIME AS NEEDED FOR STOMACH CRAMPING 180 tablet 1  . Dulaglutide (TRULICITY) 1.02 HE/5.2DP SOPN INJECT ONE SYRINGE ONCE A WEEK 6 mL 1  . Empagliflozin-Linaglip-Metform 5-2.05-998 MG TB24 Take 1,000 mg by mouth daily.    . Empagliflozin-metFORMIN HCl (SYNJARDY) 12.05-998 MG TABS Take 1 tablet by mouth daily. 90 tablet 0  . famotidine (PEPCID) 20 MG tablet TAKE ONE TABLET BY MOUTH TWICE DAILY 180 tablet 1  . ferrous sulfate 325 (65 FE) MG tablet Take 325 mg by mouth every evening.    Marland Kitchen FETZIMA 80 MG CP24 TAKE ONE CAPSULE BY MOUTH ONCE DAILY 90 capsule 1  . gabapentin (NEURONTIN) 300 MG capsule Take 1 capsule (300 mg total) by mouth 3 (three) times daily. 270 capsule 0  . glucose blood test strip 1 each by Other route in the morning and at bedtime. One Touch Verio - Patient checks blood sugar 2-3 times daily. 100 each 2  . insulin aspart (NOVOLOG FLEXPEN) 100 UNIT/ML FlexPen Three times before meals while on prednisone. 15 mL 11  . Insulin Pen Needle (BD PEN NEEDLE NANO U/F) 32G X 4 MM MISC Inject 1 each into the skin in the morning and at bedtime. 200 each 3  . LANTUS SOLOSTAR 100 UNIT/ML Solostar Pen INJECT 68 UNITS SUBCUTANEOUSLY EVERYDAY AT BEDTIME 15 mL 2  . levothyroxine (SYNTHROID) 75 MCG tablet TAKE ONE TABLET BY MOUTH ONCE DAILY 90 tablet 1  . loratadine (CLARITIN) 10 MG tablet Take 10 mg by mouth  daily. Taking 3-4 days each week due to bone pain associated with shot from cancer center.    Marland Kitchen LORazepam (ATIVAN) 0.5 MG tablet TAKE ONE TABLET BY MOUTH TWICE DAILY AS  NEEDED FOR ANXIETY 30 tablet 2  . losartan (COZAAR) 50 MG tablet TAKE ONE TABLET BY MOUTH EVERY EVENING 90 tablet 1  . morphine (MS CONTIN) 30 MG 12 hr tablet Take 1 tablet (30 mg total) by mouth every 12 (twelve) hours. 180 tablet 0  . Multiple Vitamin (MULTIVITAMIN WITH MINERALS) TABS tablet Take 1 tablet by mouth daily.     . ondansetron (ZOFRAN) 4 MG tablet TAKE ONE TABLET BY MOUTH FOUR TIMES DAILY AS NEEDED FOR NAUSEA 30 tablet 1  . ondansetron (ZOFRAN) 4 MG tablet Take 1 tablet (4 mg total) by mouth every 4 (four) hours as needed for nausea. 90 tablet 3  . OneTouch Delica Lancets 83M MISC 1 each by Does not apply route daily before breakfast. Check blood sugar twice daily. 100 each 3  . pantoprazole (PROTONIX) 40 MG tablet Take 1 tablet (40 mg total) by mouth 2 (two) times daily. 180 tablet 1  . Pegfilgrastim-jmdb (FULPHILA [place]) Inject into the skin.    . potassium chloride (MICRO-K) 10 MEQ CR capsule TAKE TWO CAPSULES BY MOUTH TWICE DAILY 180 capsule 1  . pravastatin (PRAVACHOL) 20 MG tablet TAKE ONE TABLET BY MOUTH EVERYDAY AT BEDTIME 90 tablet 1  . pravastatin (PRAVACHOL) 40 MG tablet Take 1 tablet (40 mg total) by mouth daily. 90 tablet 0  . prochlorperazine (COMPAZINE) 10 MG tablet Take 10 mg by mouth every 6 (six) hours as needed for nausea or vomiting.    . prochlorperazine (COMPAZINE) 10 MG tablet Take 1 tablet (10 mg total) by mouth every 6 (six) hours as needed for nausea or vomiting. 90 tablet 3  . senna (SENOKOT) 8.6 MG tablet Take 1 tablet by mouth daily as needed for constipation.    . SODIUM FLUORIDE 5000 SENSITIVE 1.1-5 % GEL Take by mouth 2 (two) times daily.    Marland Kitchen Specialty Vitamins Products (MAGNESIUM, AMINO ACID CHELATE,) 133 MG tablet Take 1 tablet by mouth at bedtime.    . sucralfate (CARAFATE) 1 g tablet  Take 1 tablet (1 g total) by mouth 4 (four) times daily -  with meals and at bedtime. 270 tablet 1  . Vitamin D, Ergocalciferol, (DRISDOL) 1.25 MG (50000 UNIT) CAPS capsule TAKE ONE CAPSULE BY MOUTH ONCE WEEKLY ON FRIDAY 12 capsule 1  . zolpidem (AMBIEN) 10 MG tablet TAKE ONE TABLET BY MOUTH EVERYDAY AT BEDTIME AS NEEDED 90 tablet 0   No current facility-administered medications for this visit.     ASSESSMENT & PLAN:   Assessment:   1. Positive BRCA 1 mutation, which increases her risk for breast and ovarian cancer, as well as elevates her risk for pancreatic cancer, for which she underwent hysterectomy/bilateral salpingo-oophorectomy and is on chemoprevention with raloxifene and regular surveillance.   She declined bilateral prophylactic mastectomies.   2.  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  She has now undergone bilateral mastectomies.  She started [facility] chemotherapy on October 25th, we will plan for 4 doses followed by 12 weeks of weekly Taxol.  3.  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  I think this is a separate primary.    4. Thrombocytopenia which is felt to be due to chronic ITP, mildly worse.    5. Liver cirrhosis as seen on CT  imaging in August 2020.  She has been referred and is being seen by Roosevelt Locks, CRNP, at the [facility] in [place].  Hepatitis panel was negative, and she has received the appropriate vaccines.  Abdominal ultrasound from April was stable and CT imaging from September was stable.    6. Nevi of the back which need to be monitored.  7. Severe back and bilateral leg pain associated with restless legs, felt to be due to corticosteroids.   8. Anemia secondary to chemotherapy, which has  improved.  Iron studies, B12 and folate were normal.   We will continue to monitor this.   Plan: She still has thrombocytopenia, but has a history of chronic ITP and her baseline platelet count was approximately 100,000.  I have spoken with the pharmacist and we feel it is safe for her to proceed with her 4th and final cycle of [facility] tomorrow.  She will return in 10 days for labs, and we will plan to see her back in 3 weeks with CBC and CMP for re-evaluation and to start weekly paclitaxel.  We may have to delay that if her platelet count is too low.  I advised that she follow up with Dr. [name] in regards to adjusting her diabetic medications, especially since she will require weekly doses of steroids with her weekly paclitaxel chemotherapy.  The patient understands the plans discussed today and is in agreement with them.  She knows to contact our office if her symptoms do not improve.  I provided 15 minutes of face-to-face time during this this encounter and > 50% was spent counseling as documented under my assessment and plan.    Derwood Kaplan, MD [facility] [address] [place] [address] Dept: [phone] Dept Fax: [phone]    I, Rita Ohara, am acting as scribe for Derwood Kaplan, MD  I have reviewed this report as typed by the medical scribe, and it is complete and accurate.

## 2020-01-28 ENCOUNTER — Other Ambulatory Visit: Payer: Self-pay

## 2020-01-28 ENCOUNTER — Inpatient Hospital Stay: Payer: PPO

## 2020-01-28 VITALS — BP 113/67 | HR 102 | Temp 98.6°F | Resp 18 | Ht 62.0 in | Wt 205.5 lb

## 2020-01-28 DIAGNOSIS — Z79899 Other long term (current) drug therapy: Secondary | ICD-10-CM | POA: Diagnosis not present

## 2020-01-28 DIAGNOSIS — C50811 Malignant neoplasm of overlapping sites of right female breast: Secondary | ICD-10-CM | POA: Diagnosis not present

## 2020-01-28 DIAGNOSIS — C50311 Malignant neoplasm of lower-inner quadrant of right female breast: Secondary | ICD-10-CM | POA: Diagnosis not present

## 2020-01-28 DIAGNOSIS — Z171 Estrogen receptor negative status [ER-]: Secondary | ICD-10-CM

## 2020-01-28 DIAGNOSIS — Z5111 Encounter for antineoplastic chemotherapy: Secondary | ICD-10-CM | POA: Diagnosis not present

## 2020-01-28 DIAGNOSIS — Z5189 Encounter for other specified aftercare: Secondary | ICD-10-CM | POA: Diagnosis not present

## 2020-01-28 MED ORDER — SODIUM CHLORIDE 0.9 % IV SOLN
150.0000 mg | Freq: Once | INTRAVENOUS | Status: AC
  Administered 2020-01-28: 150 mg via INTRAVENOUS
  Filled 2020-01-28: qty 150

## 2020-01-28 MED ORDER — SODIUM CHLORIDE 0.9 % IV SOLN
600.0000 mg/m2 | Freq: Once | INTRAVENOUS | Status: AC
  Administered 2020-01-28: 1240 mg via INTRAVENOUS
  Filled 2020-01-28: qty 62

## 2020-01-28 MED ORDER — DEXAMETHASONE SODIUM PHOSPHATE 10 MG/ML IJ SOLN
INTRAMUSCULAR | Status: AC
  Filled 2020-01-28: qty 1

## 2020-01-28 MED ORDER — DOXORUBICIN HCL CHEMO IV INJECTION 2 MG/ML
60.0000 mg/m2 | Freq: Once | INTRAVENOUS | Status: AC
  Administered 2020-01-28: 124 mg via INTRAVENOUS
  Filled 2020-01-28: qty 62

## 2020-01-28 MED ORDER — HEPARIN SOD (PORK) LOCK FLUSH 100 UNIT/ML IV SOLN
500.0000 [IU] | Freq: Once | INTRAVENOUS | Status: AC | PRN
  Administered 2020-01-28: 500 [IU]
  Filled 2020-01-28: qty 5

## 2020-01-28 MED ORDER — SODIUM CHLORIDE 0.9 % IV SOLN
Freq: Once | INTRAVENOUS | Status: AC
  Filled 2020-01-28: qty 250

## 2020-01-28 MED ORDER — PALONOSETRON HCL INJECTION 0.25 MG/5ML
0.2500 mg | Freq: Once | INTRAVENOUS | Status: AC
  Administered 2020-01-28: 0.25 mg via INTRAVENOUS

## 2020-01-28 MED ORDER — DEXAMETHASONE SODIUM PHOSPHATE 10 MG/ML IJ SOLN
8.0000 mg | Freq: Once | INTRAMUSCULAR | Status: AC
  Administered 2020-01-28: 8 mg via INTRAVENOUS

## 2020-01-28 MED ORDER — PALONOSETRON HCL INJECTION 0.25 MG/5ML
INTRAVENOUS | Status: AC
  Filled 2020-01-28: qty 5

## 2020-01-28 NOTE — Progress Notes (Signed)
PT STABLE AT TIME OF DISCHARGE

## 2020-01-28 NOTE — Patient Instructions (Signed)
Doxorubicin injection What is this medicine? DOXORUBICIN (dox oh ROO bi sin) is a chemotherapy drug. It is used to treat many kinds of cancer like leukemia, lymphoma, neuroblastoma, sarcoma, and Wilms' tumor. It is also used to treat bladder cancer, breast cancer, lung cancer, ovarian cancer, stomach cancer, and thyroid cancer. This medicine may be used for other purposes; ask your health care provider or pharmacist if you have questions. COMMON BRAND NAME(S): Adriamycin, Adriamycin PFS, Adriamycin RDF, Rubex What should I tell my health care provider before I take this medicine? They need to know if you have any of these conditions:  heart disease  history of low blood counts caused by a medicine  liver disease  recent or ongoing radiation therapy  an unusual or allergic reaction to doxorubicin, other chemotherapy agents, other medicines, foods, dyes, or preservatives  pregnant or trying to get pregnant  breast-feeding How should I use this medicine? This drug is given as an infusion into a vein. It is administered in a hospital or clinic by a specially trained health care professional. If you have pain, swelling, burning or any unusual feeling around the site of your injection, tell your health care professional right away. Talk to your pediatrician regarding the use of this medicine in children. Special care may be needed. Overdosage: If you think you have taken too much of this medicine contact a poison control center or emergency room at once. NOTE: This medicine is only for you. Do not share this medicine with others. What if I miss a dose? It is important not to miss your dose. Call your doctor or health care professional if you are unable to keep an appointment. What may interact with this medicine? This medicine may interact with the following medications:  6-mercaptopurine  paclitaxel  phenytoin  St. John's Wort  trastuzumab  verapamil This list may not describe  all possible interactions. Give your health care provider a list of all the medicines, herbs, non-prescription drugs, or dietary supplements you use. Also tell them if you smoke, drink alcohol, or use illegal drugs. Some items may interact with your medicine. What should I watch for while using this medicine? This drug may make you feel generally unwell. This is not uncommon, as chemotherapy can affect healthy cells as well as cancer cells. Report any side effects. Continue your course of treatment even though you feel ill unless your doctor tells you to stop. There is a maximum amount of this medicine you should receive throughout your life. The amount depends on the medical condition being treated and your overall health. Your doctor will watch how much of this medicine you receive in your lifetime. Tell your doctor if you have taken this medicine before. You may need blood work done while you are taking this medicine. Your urine may turn red for a few days after your dose. This is not blood. If your urine is dark or brown, call your doctor. In some cases, you may be given additional medicines to help with side effects. Follow all directions for their use. Call your doctor or health care professional for advice if you get a fever, chills or sore throat, or other symptoms of a cold or flu. Do not treat yourself. This drug decreases your body's ability to fight infections. Try to avoid being around people who are sick. This medicine may increase your risk to bruise or bleed. Call your doctor or health care professional if you notice any unusual bleeding. Talk to your doctor  about your risk of cancer. You may be more at risk for certain types of cancers if you take this medicine. Do not become pregnant while taking this medicine or for 6 months after stopping it. Women should inform their doctor if they wish to become pregnant or think they might be pregnant. Men should not father a child while taking this  medicine and for 6 months after stopping it. There is a potential for serious side effects to an unborn child. Talk to your health care professional or pharmacist for more information. Do not breast-feed an infant while taking this medicine. This medicine has caused ovarian failure in some women and reduced sperm counts in some men This medicine may interfere with the ability to have a child. Talk with your doctor or health care professional if you are concerned about your fertility. This medicine may cause a decrease in Co-Enzyme Q-10. You should make sure that you get enough Co-Enzyme Q-10 while you are taking this medicine. Discuss the foods you eat and the vitamins you take with your health care professional. What side effects may I notice from receiving this medicine? Side effects that you should report to your doctor or health care professional as soon as possible:  allergic reactions like skin rash, itching or hives, swelling of the face, lips, or tongue  breathing problems  chest pain  fast or irregular heartbeat  low blood counts - this medicine may decrease the number of white blood cells, red blood cells and platelets. You may be at increased risk for infections and bleeding.  pain, redness, or irritation at site where injected  signs of infection - fever or chills, cough, sore throat, pain or difficulty passing urine  signs of decreased platelets or bleeding - bruising, pinpoint red spots on the skin, black, tarry stools, blood in the urine  swelling of the ankles, feet, hands  tiredness  weakness Side effects that usually do not require medical attention (report to your doctor or health care professional if they continue or are bothersome):  diarrhea  hair loss  mouth sores  nail discoloration or damage  nausea  red colored urine  vomiting This list may not describe all possible side effects. Call your doctor for medical advice about side effects. You may report  side effects to FDA at 1-800-FDA-1088. Where should I keep my medicine? This drug is given in a hospital or clinic and will not be stored at home. NOTE: This sheet is a summary. It may not cover all possible information. If you have questions about this medicine, talk to your doctor, pharmacist, or health care provider.  2020 Elsevier/Gold Standard (2016-08-24 11:01:26) Cyclophosphamide Injection What is this medicine? CYCLOPHOSPHAMIDE (sye kloe FOSS fa mide) is a chemotherapy drug. It slows the growth of cancer cells. This medicine is used to treat many types of cancer like lymphoma, myeloma, leukemia, breast cancer, and ovarian cancer, to name a few. This medicine may be used for other purposes; ask your health care provider or pharmacist if you have questions. COMMON BRAND NAME(S): Cytoxan, Neosar What should I tell my health care provider before I take this medicine? They need to know if you have any of these conditions:  heart disease  history of irregular heartbeat  infection  kidney disease  liver disease  low blood counts, like white cells, platelets, or red blood cells  on hemodialysis  recent or ongoing radiation therapy  scarring or thickening of the lungs  trouble passing urine  an  unusual or allergic reaction to cyclophosphamide, other medicines, foods, dyes, or preservatives  pregnant or trying to get pregnant  breast-feeding How should I use this medicine? This drug is usually given as an injection into a vein or muscle or by infusion into a vein. It is administered in a hospital or clinic by a specially trained health care professional. Talk to your pediatrician regarding the use of this medicine in children. Special care may be needed. Overdosage: If you think you have taken too much of this medicine contact a poison control center or emergency room at once. NOTE: This medicine is only for you. Do not share this medicine with others. What if I miss a  dose? It is important not to miss your dose. Call your doctor or health care professional if you are unable to keep an appointment. What may interact with this medicine?  amphotericin B  azathioprine  certain antivirals for HIV or hepatitis  certain medicines for blood pressure, heart disease, irregular heart beat  certain medicines that treat or prevent blood clots like warfarin  certain other medicines for cancer  cyclosporine  etanercept  indomethacin  medicines that relax muscles for surgery  medicines to increase blood counts  metronidazole This list may not describe all possible interactions. Give your health care provider a list of all the medicines, herbs, non-prescription drugs, or dietary supplements you use. Also tell them if you smoke, drink alcohol, or use illegal drugs. Some items may interact with your medicine. What should I watch for while using this medicine? Your condition will be monitored carefully while you are receiving this medicine. You may need blood work done while you are taking this medicine. Drink water or other fluids as directed. Urinate often, even at night. Some products may contain alcohol. Ask your health care professional if this medicine contains alcohol. Be sure to tell all health care professionals you are taking this medicine. Certain medicines, like metronidazole and disulfiram, can cause an unpleasant reaction when taken with alcohol. The reaction includes flushing, headache, nausea, vomiting, sweating, and increased thirst. The reaction can last from 30 minutes to several hours. Do not become pregnant while taking this medicine or for 1 year after stopping it. Women should inform their health care professional if they wish to become pregnant or think they might be pregnant. Men should not father a child while taking this medicine and for 4 months after stopping it. There is potential for serious side effects to an unborn child. Talk to your  health care professional for more information. Do not breast-feed an infant while taking this medicine or for 1 week after stopping it. This medicine has caused ovarian failure in some women. This medicine may make it more difficult to get pregnant. Talk to your health care professional if you are concerned about your fertility. This medicine has caused decreased sperm counts in some men. This may make it more difficult to father a child. Talk to your health care professional if you are concerned about your fertility. Call your health care professional for advice if you get a fever, chills, or sore throat, or other symptoms of a cold or flu. Do not treat yourself. This medicine decreases your body's ability to fight infections. Try to avoid being around people who are sick. Avoid taking medicines that contain aspirin, acetaminophen, ibuprofen, naproxen, or ketoprofen unless instructed by your health care professional. These medicines may hide a fever. Talk to your health care professional about your risk of cancer.  You may be more at risk for certain types of cancer if you take this medicine. If you are going to need surgery or other procedure, tell your health care professional that you are using this medicine. Be careful brushing or flossing your teeth or using a toothpick because you may get an infection or bleed more easily. If you have any dental work done, tell your dentist you are receiving this medicine. What side effects may I notice from receiving this medicine? Side effects that you should report to your doctor or health care professional as soon as possible:  allergic reactions like skin rash, itching or hives, swelling of the face, lips, or tongue  breathing problems  nausea, vomiting  signs and symptoms of bleeding such as bloody or black, tarry stools; red or dark brown urine; spitting up blood or brown material that looks like coffee grounds; red spots on the skin; unusual bruising  or bleeding from the eyes, gums, or nose  signs and symptoms of heart failure like fast, irregular heartbeat, sudden weight gain; swelling of the ankles, feet, hands  signs and symptoms of infection like fever; chills; cough; sore throat; pain or trouble passing urine  signs and symptoms of kidney injury like trouble passing urine or change in the amount of urine  signs and symptoms of liver injury like dark yellow or brown urine; general ill feeling or flu-like symptoms; light-colored stools; loss of appetite; nausea; right upper belly pain; unusually weak or tired; yellowing of the eyes or skin Side effects that usually do not require medical attention (report to your doctor or health care professional if they continue or are bothersome):  confusion  decreased hearing  diarrhea  facial flushing  hair loss  headache  loss of appetite  missed menstrual periods  signs and symptoms of low red blood cells or anemia such as unusually weak or tired; feeling faint or lightheaded; falls  skin discoloration This list may not describe all possible side effects. Call your doctor for medical advice about side effects. You may report side effects to FDA at 1-800-FDA-1088. Where should I keep my medicine? This drug is given in a hospital or clinic and will not be stored at home. NOTE: This sheet is a summary. It may not cover all possible information. If you have questions about this medicine, talk to your doctor, pharmacist, or health care provider.  2020 Elsevier/Gold Standard (2018-10-15 09:53:29)

## 2020-01-30 ENCOUNTER — Inpatient Hospital Stay: Payer: PPO

## 2020-01-30 ENCOUNTER — Other Ambulatory Visit: Payer: Self-pay

## 2020-01-30 VITALS — BP 119/60 | HR 101 | Temp 98.7°F | Resp 18 | Wt 209.8 lb

## 2020-01-30 DIAGNOSIS — C50311 Malignant neoplasm of lower-inner quadrant of right female breast: Secondary | ICD-10-CM

## 2020-01-30 DIAGNOSIS — Z5111 Encounter for antineoplastic chemotherapy: Secondary | ICD-10-CM | POA: Diagnosis not present

## 2020-01-30 DIAGNOSIS — Z171 Estrogen receptor negative status [ER-]: Secondary | ICD-10-CM

## 2020-01-30 MED ORDER — PEGFILGRASTIM-JMDB 6 MG/0.6ML ~~LOC~~ SOSY
PREFILLED_SYRINGE | SUBCUTANEOUS | Status: AC
  Filled 2020-01-30: qty 0.6

## 2020-01-30 MED ORDER — PEGFILGRASTIM-JMDB 6 MG/0.6ML ~~LOC~~ SOSY
6.0000 mg | PREFILLED_SYRINGE | Freq: Once | SUBCUTANEOUS | Status: AC
  Administered 2020-01-30: 6 mg via SUBCUTANEOUS

## 2020-01-30 NOTE — Patient Instructions (Signed)

## 2020-01-31 DIAGNOSIS — E1142 Type 2 diabetes mellitus with diabetic polyneuropathy: Secondary | ICD-10-CM | POA: Diagnosis not present

## 2020-01-31 DIAGNOSIS — L02612 Cutaneous abscess of left foot: Secondary | ICD-10-CM | POA: Diagnosis not present

## 2020-02-03 ENCOUNTER — Encounter: Payer: Self-pay | Admitting: Oncology

## 2020-02-05 ENCOUNTER — Telehealth: Payer: Self-pay

## 2020-02-05 ENCOUNTER — Other Ambulatory Visit: Payer: Self-pay | Admitting: Pharmacist

## 2020-02-05 DIAGNOSIS — C50311 Malignant neoplasm of lower-inner quadrant of right female breast: Secondary | ICD-10-CM

## 2020-02-05 DIAGNOSIS — Z171 Estrogen receptor negative status [ER-]: Secondary | ICD-10-CM

## 2020-02-05 DIAGNOSIS — Z23 Encounter for immunization: Secondary | ICD-10-CM

## 2020-02-05 NOTE — Progress Notes (Signed)
Chronic Care Management Pharmacy Assistant   Name: Graziella Connery  MRN: [id number] DOB: 1970/06/24  Reason for Encounter: Medication Review for [facility] delivery coordination  Patient Questions:  1.  Have you seen any other providers since your last visit? yes 01/31/20 Podiatry- was given Doxycycline 144m for infection in toe 01/27/20 Oncology- No med changes    2.  Any changes in your medicines or health? Yes,  Given Doxycycline 100 mg for infection Has been very sick due to chemo, platelets have went down to 12, she is going to have an infusion.      PCP : CRochel Brome MD  Allergies:   Allergies  Allergen Reactions   Codeine Shortness Of Breath   Celecoxib Other (See Comments)    Unknown reaction   Ezetimibe-Simvastatin Other (See Comments)    Unknown reaction   Propranolol Hcl Other (See Comments)    Unknown reaction    Medications: Outpatient Encounter Medications as of 02/05/2020  Medication Sig Note   benzonatate (TESSALON) 100 MG capsule Take 1 capsule (100 mg total) by mouth 3 (three) times daily as needed for cough.    Blood Glucose Monitoring Suppl (ONETOUCH VERIO REFLECT) w/Device KIT AS DIRECTED    buPROPion (WELLBUTRIN XL) 300 MG 24 hr tablet TAKE ONE TABLET BY MOUTH EVERY EVENING    calcium citrate-vitamin D (CITRACAL+D) 315-200 MG-UNIT tablet Take 1 tablet by mouth 2 (two) times daily.    cephALEXin (KEFLEX) 500 MG capsule Take 500 mg by mouth 2 (two) times daily.    clotrimazole-betamethasone (LOTRISONE) cream Apply small amount to affected area twice daily (Patient taking differently: as needed. Apply small amount to affected area twice daily)    Continuous Blood Gluc Receiver ([place] DEVI 1 each by Does not apply route in the morning, at noon, and at bedtime.    Continuous Blood Gluc Sensor (DEXCOM G6 SENSOR) MISC Check sugars qac and qhs.    Continuous Blood Gluc Transmit (DEXCOM G6 TRANSMITTER) MISC 1 each by Does not  apply route every 3 (three) months.    cyclobenzaprine (FLEXERIL) 10 MG tablet Take 10 mg by mouth every 8 (eight) hours as needed.    dexamethasone (DECADRON) 4 MG tablet Take 4 mg by mouth 2 (two) times daily with a meal.    dicyclomine (BENTYL) 20 MG tablet TAKE ONE TABLET BY MOUTH BEFORE MEALS AND AT BEDTIME AS NEEDED FOR STOMACH CRAMPING    Dulaglutide (TRULICITY) 05.46MFK/8.1EXSOPN INJECT ONE SYRINGE ONCE A WEEK    Empagliflozin-Linaglip-Metform 5-2.05-998 MG TB24 Take 1,000 mg by mouth daily.    Empagliflozin-metFORMIN HCl (SYNJARDY) 12.05-998 MG TABS Take 1 tablet by mouth daily.    famotidine (PEPCID) 20 MG tablet TAKE ONE TABLET BY MOUTH TWICE DAILY    ferrous sulfate 325 (65 FE) MG tablet Take 325 mg by mouth every evening.    FETZIMA 80 MG CP24 TAKE ONE CAPSULE BY MOUTH ONCE DAILY    gabapentin (NEURONTIN) 300 MG capsule Take 1 capsule (300 mg total) by mouth 3 (three) times daily.    glucose blood test strip 1 each by Other route in the morning and at bedtime. One Touch Verio - Patient checks blood sugar 2-3 times daily.    insulin aspart (NOVOLOG FLEXPEN) 100 UNIT/ML FlexPen Three times before meals while on prednisone.    Insulin Pen Needle (BD PEN NEEDLE NANO U/F) 32G X 4 MM MISC Inject 1 each into the skin in the morning and at bedtime.  LANTUS SOLOSTAR 100 UNIT/ML Solostar Pen INJECT 68 UNITS SUBCUTANEOUSLY EVERYDAY AT BEDTIME    levothyroxine (SYNTHROID) 75 MCG tablet TAKE ONE TABLET BY MOUTH ONCE DAILY    loratadine (CLARITIN) 10 MG tablet Take 10 mg by mouth daily. Taking 3-4 days each week due to bone pain associated with shot from cancer center.    LORazepam (ATIVAN) 0.5 MG tablet TAKE ONE TABLET BY MOUTH TWICE DAILY AS NEEDED FOR ANXIETY    losartan (COZAAR) 50 MG tablet TAKE ONE TABLET BY MOUTH EVERY EVENING    morphine (MS CONTIN) 30 MG 12 hr tablet Take 1 tablet (30 mg total) by mouth every 12 (twelve) hours.    Multiple Vitamin (MULTIVITAMIN WITH  MINERALS) TABS tablet Take 1 tablet by mouth daily.  06/08/2018: Pt plans on purchasing again when she can get out.   ondansetron (ZOFRAN) 4 MG tablet TAKE ONE TABLET BY MOUTH FOUR TIMES DAILY AS NEEDED FOR NAUSEA    ondansetron (ZOFRAN) 4 MG tablet Take 1 tablet (4 mg total) by mouth every 4 (four) hours as needed for nausea.    OneTouch Delica Lancets 33I MISC 1 each by Does not apply route daily before breakfast. Check blood sugar twice daily.    pantoprazole (PROTONIX) 40 MG tablet Take 1 tablet (40 mg total) by mouth 2 (two) times daily.    Pegfilgrastim-jmdb (FULPHILA [place]) Inject into the skin.    potassium chloride (MICRO-K) 10 MEQ CR capsule TAKE TWO CAPSULES BY MOUTH TWICE DAILY    pravastatin (PRAVACHOL) 20 MG tablet TAKE ONE TABLET BY MOUTH EVERYDAY AT BEDTIME    pravastatin (PRAVACHOL) 40 MG tablet Take 1 tablet (40 mg total) by mouth daily.    prochlorperazine (COMPAZINE) 10 MG tablet Take 10 mg by mouth every 6 (six) hours as needed for nausea or vomiting.    prochlorperazine (COMPAZINE) 10 MG tablet Take 1 tablet (10 mg total) by mouth every 6 (six) hours as needed for nausea or vomiting.    senna (SENOKOT) 8.6 MG tablet Take 1 tablet by mouth daily as needed for constipation.    SODIUM FLUORIDE 5000 SENSITIVE 1.1-5 % GEL Take by mouth 2 (two) times daily.    Specialty Vitamins Products (MAGNESIUM, AMINO ACID CHELATE,) 133 MG tablet Take 1 tablet by mouth at bedtime.    sucralfate (CARAFATE) 1 g tablet Take 1 tablet (1 g total) by mouth 4 (four) times daily -  with meals and at bedtime.    Vitamin D, Ergocalciferol, (DRISDOL) 1.25 MG (50000 UNIT) CAPS capsule TAKE ONE CAPSULE BY MOUTH ONCE WEEKLY ON FRIDAY    zolpidem (AMBIEN) 10 MG tablet TAKE ONE TABLET BY MOUTH EVERYDAY AT BEDTIME AS NEEDED    No facility-administered encounter medications on file as of 02/05/2020.    Current Diagnosis: Patient Active Problem List   Diagnosis Date Noted   Thrombocytopenia,  unspecified ([place]) 01/27/2020   Malignant neoplasm of lower-inner quadrant of right breast of female, estrogen receptor negative ([place]) 10/01/2019   Chronic pain syndrome 07/08/2019   Memory loss 07/08/2019   Depression, major, recurrent, mild ([place]) 95/18/8416   Uncomplicated opioid dependence ([place]) 07/08/2019   Mixed hyperlipidemia 04/11/2019   Dyslipidemia associated with type 2 diabetes mellitus ([place]) 04/11/2019   Essential hypertension, benign 04/11/2019   Major depressive disorder, single episode, mild ([place]) 04/11/2019   Acquired hypothyroidism 04/11/2019   Vitamin D insufficiency 04/11/2019   Class 2 severe obesity due to excess calories with serious comorbidity and body mass index (BMI) of 35.0 to  35.9 in adult [facility]) 04/11/2019   Pre-ulcerative calluses 03/04/2019   Liver cirrhosis secondary to NASH (nonalcoholic steatohepatitis) ([place]) 09/13/2018    Class: Chronic   BRCA1 positive 06/14/2016    Reviewed chart for medication changes ahead of medication coordination call.  No OVs, Consults, or hospital visits since last care coordination call/Pharmacist visit. (If appropriate, list visit date, provider name)  No medication changes indicated OR if recent visit, treatment plan here.  BP Readings from Last 3 Encounters:  01/30/20 119/60  01/28/20 113/67  01/27/20 130/60    Lab Results  Component Value Date   HGBA1C 6.9 (H) 10/09/2019     Patient obtains medications through Vials  30 Days   Last adherence delivery included:  Famotidine 20 mg- 1 tablet twice daily Fetzima-80 mg- 1 capsule daily Pravastatin 20 mg- 1 tablet at bedtime  Lantus Solostar U 100- inject 0.74minto skin at bedtime Onetouch verio test strops use to check blood sugar 2-3 times daily Ondansetron 439m Take 1 tablet every 4 hours as needed for nausea Lorazepam 0.5 mg- 1 tablet twice a day as needed Sucralfate 1 gram- 1 tablet four times a day with meals and at  bedtime. Cyclobenzaprine 10 mg - 1 tablet by mouth every 8 hours as needed.  Potassium chloride ER 10 meq- 2 capsules twice daily Vitamin D 50,000 units - 1 capsule weekly on Fridays Bupropion Hcl Xl 300 mg- 1 tablet in evening Levothyroxine 75 mcg- 1 tablet daily Losartan Potassium 50 mg- 1 tablet in evening Zolpidem 10 mg- 1 tablet as needed at bedtime Gabapentin 300 mg- 1 capsule twice daily Synjardy 12.05-998 mg- 1 tablet daily Trulicity- inject 1 syringe once a week Pantoprazole 40 mg take 1 tablet twice daily  Patient declined (meds) last month  Benzonatate 10036m Finished course Prochlorperazine Maleate 32m47m tablet every 6 hours as needed for nausea and vomiting Prevident 5000 enamel protectant  Morphine Sulfate 30 mg -2 times daily PRN- 90 DS on 12/26/19 Cephalexin 500mg29mmpleted course Novalog inj flex pen- 3 month supply on 12/04/19 Dexcom Dexamethasone 4 mg- States she has completed course Methocarbamol 500 mg- States she no longer takes. Takes flexeril instead.  Pen Needles-  Lancets-  Dicyclomine 20 mg- Uses PRN has plenty on hand  Patient is due for next adherence delivery on: 03/03/20 Called patient and reviewed medications and coordinated delivery.  This delivery to include: Patient will not need any short or acute fills  Patient declined the following medications Sucralfate  Lorazepam  Lancets Pen needles  Patient needs refills for    Confirmed delivery date of 03/03/20 advised patient that pharmacy will contact them the morning of delivery.  Follow-Up:  Coordination of Enhanced Pharmacy Services and Pharmacist Review  Sara Donette Larrynotified     Tami Clarita Leber CSabinmacist Assistant [phone]

## 2020-02-06 ENCOUNTER — Other Ambulatory Visit: Payer: Self-pay

## 2020-02-06 ENCOUNTER — Inpatient Hospital Stay: Payer: PPO

## 2020-02-06 ENCOUNTER — Other Ambulatory Visit: Payer: Self-pay | Admitting: Hematology and Oncology

## 2020-02-06 ENCOUNTER — Other Ambulatory Visit: Payer: Self-pay | Admitting: Pharmacist

## 2020-02-06 ENCOUNTER — Telehealth: Payer: Self-pay

## 2020-02-06 ENCOUNTER — Encounter: Payer: Self-pay | Admitting: Hematology and Oncology

## 2020-02-06 DIAGNOSIS — C50311 Malignant neoplasm of lower-inner quadrant of right female breast: Secondary | ICD-10-CM

## 2020-02-06 DIAGNOSIS — T451X5A Adverse effect of antineoplastic and immunosuppressive drugs, initial encounter: Secondary | ICD-10-CM | POA: Insufficient documentation

## 2020-02-06 DIAGNOSIS — Z171 Estrogen receptor negative status [ER-]: Secondary | ICD-10-CM

## 2020-02-06 DIAGNOSIS — D6959 Other secondary thrombocytopenia: Secondary | ICD-10-CM | POA: Insufficient documentation

## 2020-02-06 LAB — CBC AND DIFFERENTIAL
HCT: 26 — AB (ref 36–46)
Hemoglobin: 8.6 — AB (ref 12.0–16.0)
Neutrophils Absolute: 0.39
Platelets: 12 — AB (ref 150–399)
WBC: 1

## 2020-02-06 LAB — HEPATIC FUNCTION PANEL
ALT: 18 (ref 7–35)
AST: 18 (ref 13–35)
Alkaline Phosphatase: 90 (ref 25–125)
Bilirubin, Total: 0.7

## 2020-02-06 LAB — BASIC METABOLIC PANEL
BUN: 10 (ref 4–21)
CO2: 24 — AB (ref 13–22)
Chloride: 102 (ref 99–108)
Creatinine: 0.7 (ref 0.5–1.1)
Glucose: 309
Potassium: 3.8 (ref 3.4–5.3)
Sodium: 135 — AB (ref 137–147)

## 2020-02-06 LAB — CBC: RBC: 2.8 — AB (ref 3.87–5.11)

## 2020-02-06 LAB — COMPREHENSIVE METABOLIC PANEL
Albumin: 3.8 (ref 3.5–5.0)
Calcium: 9 (ref 8.7–10.7)

## 2020-02-10 ENCOUNTER — Ambulatory Visit: Payer: PPO | Admitting: Family Medicine

## 2020-02-10 NOTE — Progress Notes (Signed)
Virtual Visit via Telephone Note   This visit type was conducted due to national recommendations for restrictions regarding the COVID-19 Pandemic (e.g. social distancing) in an effort to limit this patient's exposure and mitigate transmission in our community.  Due to her co-morbid illnesses, this patient is at least at moderate risk for complications without adequate follow up.  This format is felt to be most appropriate for this patient at this time.  The patient did not have access to video technology/had technical difficulties with video requiring transitioning to audio format only (telephone).  All issues noted in this document were discussed and addressed.  No physical exam could be performed with this format.  Patient verbally consented to a telehealth visit.   Date:  02/22/2020   ID:  Linda [name], DOB 03-Jul-1970, MRN [id number]  Patient Location: Home Provider Location: Office/Clinic  PCP:  [name], MD   Evaluation Performed: chronic Chief Complaint:  Uncontrolled sugars.  History of Present Illness:    Linda [name] is a 50 y.o. female  Type 2 diabetes and breast cancer undergoing chemo. Pt underwent BL mastectomy in 09/2019 and has been receiving chemotherapy since November 2021. Pt has 12 more treatments. Pt has had a complicated course with infection (bronchitis, toe infection currently, as well as RLS worsening. Pt developed a baker's cyst and had BL swelling of Lower extremities. She had cellulitis of lower extremities which was treated with antibiotics. She is currently on doxycycline for toe infection. Scheduled to see podiatry, Dr. Ronnald Ramp, this Friday. This is a follow up visit.  Pt is complaining of hoarseness this am. No sore throat, no coughing or sob. No earaches or nasal congestion.   Diabetes: Sugars 200-300s. Pt is very hungry and she is eating. Steroids increase sugars and makes her want to eat. No low sugars. Taking novolog 6 U prior to meals.  Eats twice daily.   Thrombocytopenia (ITP) and now with chemo it drops. Platelets dropped to 12 last week and they gave her platelets this past Friday.   Very depressed. Stressed out with chemo/infection. Still taking fetzima 80 mg once daily. Stepmother has been helping her.  She is experiencing anhedonia, increased somnolence, increased appetite, sluggish, and trouble concentrating. Denies suicidal ideation.  Past Medical History:  Diagnosis Date  . Anemia   . Anemia, unspecified   . Anemia, unspecified   . Anemia, unspecified   . Anxiety   . Arthritis   . BRCA gene mutation positive   . Chronic pain syndrome   . Depression   . Diabetes mellitus without complication ([place])    type 2  . Gastritis   . 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 GERD (gastroesophageal reflux disease)   . History of kidney stones   . Hypertension   . Hypothyroidism   . Malignant neoplasm of central portion of right female breast ([place])   . Malignant neoplasm of lower-inner quadrant of right female breast ([place])   . Malignant neoplasm of lower-inner quadrant of right female breast ([place])   . Mixed hyperlipidemia   . Other primary thrombocytopenia ([place])   . Sleep apnea    hx of . No longer has    Past Surgical History:  Procedure Laterality Date  . APPENDECTOMY    . BACK SURGERY     x 3 lower disc  . BILATERAL TOTAL MASTECTOMY WITH AXILLARY LYMPH NODE DISSECTION Bilateral 09/2019  .  CHOLECYSTECTOMY    . nephrolithiasis    . ROBOTIC ASSISTED TOTAL HYSTERECTOMY WITH BILATERAL SALPINGO OOPHERECTOMY Bilateral 06/14/2016   Procedure: ROBOTIC ASSISTED TOTAL HYSTERECTOMY WITH BILATERAL SALPINGO OOPHORECTOMY;  Surgeon: Nancy Marus, MD;  Location: [facility];  Service: Gynecology;  Laterality: Bilateral;    Family History  Problem Relation Age of Onset  . Cancer Mother        breast  . CAD Father   . Diabetes Father   . Heart  failure Father   . Cancer Father        renal carcinoma  . Kidney failure Father   . Stroke Paternal Grandmother     Social History   Socioeconomic History  . Marital status: Divorced    Spouse name: Not on file  . Number of children: Not on file  . Years of education: Not on file  . Highest education level: Not on file  Occupational History  . Occupation: disabled  Tobacco Use  . Smoking status: Never Smoker  . Smokeless tobacco: Never Used  Substance and Sexual Activity  . Alcohol use: No  . Drug use: No  . Sexual activity: Yes  Other Topics Concern  . Not on file  Social History Narrative   wears sunscreen, brushes and flosses daily, see's dentist bi-annually, has smoke/carbon monoxide detectors, wears a seatbelt and practices gun safety   Social Determinants of Health   Financial Resource Strain: Not on file  Food Insecurity: No Food Insecurity  . Worried About Charity fundraiser in the Last Year: Never true  . Ran Out of Food in the Last Year: Never true  Transportation Needs: Not on file  Physical Activity: Not on file  Stress: Not on file  Social Connections: Not on file  Intimate Partner Violence: Not on file    Outpatient Medications Prior to Visit  Medication Sig Dispense Refill  . benzonatate (TESSALON) 100 MG capsule Take 1 capsule (100 mg total) by mouth 3 (three) times daily as needed for cough. 30 capsule 0  . Blood Glucose Monitoring Suppl (ONETOUCH VERIO REFLECT) w/Device KIT AS DIRECTED    . buPROPion (WELLBUTRIN XL) 300 MG 24 hr tablet TAKE ONE TABLET BY MOUTH EVERY EVENING 90 tablet 1  . calcium citrate-vitamin D (CITRACAL+D) 315-200 MG-UNIT tablet Take 1 tablet by mouth 2 (two) times daily.    . clotrimazole-betamethasone (LOTRISONE) cream Apply small amount to affected area twice daily (Patient taking differently: as needed. Apply small amount to affected area twice daily) 45 g 0  . Continuous Blood Gluc Receiver ([place]) DEVI 1 each  by Does not apply route in the morning, at noon, and at bedtime. 1 each 3  . Continuous Blood Gluc Sensor (DEXCOM G6 SENSOR) MISC Check sugars qac and qhs. 9 each 3  . Continuous Blood Gluc Transmit (DEXCOM G6 TRANSMITTER) MISC 1 each by Does not apply route every 3 (three) months. 1 each 3  . cyclobenzaprine (FLEXERIL) 10 MG tablet Take 10 mg by mouth every 8 (eight) hours as needed.    Marland Kitchen dexamethasone (DECADRON) 4 MG tablet Take 4 mg by mouth 2 (two) times daily with a meal.    . dicyclomine (BENTYL) 20 MG tablet TAKE ONE TABLET BY MOUTH BEFORE MEALS AND AT BEDTIME AS NEEDED FOR STOMACH CRAMPING 180 tablet 1  . Dulaglutide (TRULICITY) 0.97 DZ/3.2DJ SOPN INJECT ONE SYRINGE ONCE A WEEK 6 mL 1  . Empagliflozin-Linaglip-Metform 5-2.05-998 MG TB24 Take 1,000 mg by mouth daily.    Marland Kitchen  Empagliflozin-metFORMIN HCl (SYNJARDY) 12.05-998 MG TABS Take 1 tablet by mouth daily. 90 tablet 0  . famotidine (PEPCID) 20 MG tablet TAKE ONE TABLET BY MOUTH TWICE DAILY 180 tablet 1  . ferrous sulfate 325 (65 FE) MG tablet Take 325 mg by mouth every evening.    Marland Kitchen FETZIMA 80 MG CP24 TAKE ONE CAPSULE BY MOUTH ONCE DAILY 90 capsule 1  . gabapentin (NEURONTIN) 300 MG capsule Take 1 capsule (300 mg total) by mouth 3 (three) times daily. 270 capsule 0  . glucose blood test strip 1 each by Other route in the morning and at bedtime. One Touch Verio - Patient checks blood sugar 2-3 times daily. 100 each 2  . insulin aspart (NOVOLOG FLEXPEN) 100 UNIT/ML FlexPen Three times before meals while on prednisone. 15 mL 11  . Insulin Pen Needle (BD PEN NEEDLE NANO U/F) 32G X 4 MM MISC Inject 1 each into the skin in the morning and at bedtime. 200 each 3  . LANTUS SOLOSTAR 100 UNIT/ML Solostar Pen INJECT 68 UNITS SUBCUTANEOUSLY EVERYDAY AT BEDTIME 15 mL 2  . levothyroxine (SYNTHROID) 75 MCG tablet TAKE ONE TABLET BY MOUTH ONCE DAILY 90 tablet 1  . loratadine (CLARITIN) 10 MG tablet Take 10 mg by mouth daily. Taking 3-4 days each week due  to bone pain associated with shot from cancer center.    Marland Kitchen LORazepam (ATIVAN) 0.5 MG tablet TAKE ONE TABLET BY MOUTH TWICE DAILY AS NEEDED FOR ANXIETY 30 tablet 2  . losartan (COZAAR) 50 MG tablet TAKE ONE TABLET BY MOUTH EVERY EVENING 90 tablet 1  . morphine (MS CONTIN) 30 MG 12 hr tablet Take 1 tablet (30 mg total) by mouth every 12 (twelve) hours. 180 tablet 0  . Multiple Vitamin (MULTIVITAMIN WITH MINERALS) TABS tablet Take 1 tablet by mouth daily.     . ondansetron (ZOFRAN) 4 MG tablet Take 1 tablet (4 mg total) by mouth every 4 (four) hours as needed for nausea. 90 tablet 3  . OneTouch Delica Lancets 70Y MISC 1 each by Does not apply route daily before breakfast. Check blood sugar twice daily. 100 each 3  . pantoprazole (PROTONIX) 40 MG tablet Take 1 tablet (40 mg total) by mouth 2 (two) times daily. 180 tablet 1  . Pegfilgrastim-jmdb (FULPHILA [place]) Inject into the skin.    . potassium chloride (MICRO-K) 10 MEQ CR capsule TAKE TWO CAPSULES BY MOUTH TWICE DAILY 180 capsule 1  . pravastatin (PRAVACHOL) 40 MG tablet Take 1 tablet (40 mg total) by mouth daily. 90 tablet 0  . prochlorperazine (COMPAZINE) 10 MG tablet Take 1 tablet (10 mg total) by mouth every 6 (six) hours as needed for nausea or vomiting. 90 tablet 3  . senna (SENOKOT) 8.6 MG tablet Take 1 tablet by mouth daily as needed for constipation.    . SODIUM FLUORIDE 5000 SENSITIVE 1.1-5 % GEL Take by mouth 2 (two) times daily.    Marland Kitchen Specialty Vitamins Products (MAGNESIUM, AMINO ACID CHELATE,) 133 MG tablet Take 1 tablet by mouth at bedtime.    . sucralfate (CARAFATE) 1 g tablet Take 1 tablet (1 g total) by mouth 4 (four) times daily -  with meals and at bedtime. 270 tablet 1  . Vitamin D, Ergocalciferol, (DRISDOL) 1.25 MG (50000 UNIT) CAPS capsule TAKE ONE CAPSULE BY MOUTH ONCE WEEKLY ON FRIDAY 12 capsule 1  . zolpidem (AMBIEN) 10 MG tablet TAKE ONE TABLET BY MOUTH EVERYDAY AT BEDTIME AS NEEDED 90 tablet 0  . cephALEXin (KEFLEX)  500 MG  capsule Take 500 mg by mouth 2 (two) times daily.    . ondansetron (ZOFRAN) 4 MG tablet TAKE ONE TABLET BY MOUTH FOUR TIMES DAILY AS NEEDED FOR NAUSEA 30 tablet 1  . pravastatin (PRAVACHOL) 20 MG tablet TAKE ONE TABLET BY MOUTH EVERYDAY AT BEDTIME 90 tablet 1  . prochlorperazine (COMPAZINE) 10 MG tablet Take 10 mg by mouth every 6 (six) hours as needed for nausea or vomiting.     No facility-administered medications prior to visit.    Allergies:   Codeine, Celecoxib, Ezetimibe-simvastatin, and Propranolol hcl   Social History   Tobacco Use  . Smoking status: Never Smoker  . Smokeless tobacco: Never Used  Substance Use Topics  . Alcohol use: No  . Drug use: No     Review of Systems  Constitutional: Positive for malaise/fatigue (sluggish). Negative for chills, diaphoresis and fever.       Increased appetite  HENT: Negative for congestion, ear pain and sore throat.   Respiratory: Negative for cough and sputum production.   Cardiovascular: Negative for chest pain and palpitations.  Gastrointestinal: Positive for nausea and vomiting. Negative for abdominal pain, constipation and diarrhea.  Genitourinary: Negative for dysuria and urgency.  Musculoskeletal: Negative for myalgias.  Neurological: Negative for dizziness and headaches.  Endo/Heme/Allergies: Positive for polydipsia.  Psychiatric/Behavioral: Positive for depression. The patient is not nervous/anxious and does not have insomnia.        Anhedonia     Labs/Other Tests and Data Reviewed:    Recent Labs: 07/08/2019: TSH 1.450 02/17/2020: ALT 22; BUN 10; Creatinine 0.8; Hemoglobin 9.6; Platelets 113; Potassium 4.0; Sodium 133   Recent Lipid Panel Lab Results  Component Value Date/Time   CHOL 181 10/09/2019 09:47 AM   TRIG 146 10/09/2019 09:47 AM   HDL 43 10/09/2019 09:47 AM   CHOLHDL 4.2 10/09/2019 09:47 AM   LDLCALC 112 (H) 10/09/2019 09:47 AM    Wt Readings from Last 3 Encounters:  02/19/20 203 lb 12 oz (92.4 kg)   02/17/20 199 lb 11.2 oz (90.6 kg)  02/11/20 212 lb (96.2 kg)     Objective:    Vital Signs:  BP 113/69   Pulse 100   Ht 5' 2"  (1.575 m)   Wt 212 lb (96.2 kg)   LMP 06/13/2009   BMI 38.78 kg/m    Physical Exam HENT:     Mouth/Throat:     Comments: Hoarse     ASSESSMENT & PLAN:   1. Diabetic nephropathy associated with type 2 diabetes mellitus ([place]) Continue synjardy 12.05/998 mg once daily.  Recommend 6 U before meals if > 150 as it goes up when she is on prednisone.   2. Depression, major, recurrent, mild ([place]) Continue conseling.   3. Pedal edema Improving per pt report.   4. Cellulitis of toe of left foot Continue antibiotics.  Keep follow up appointment with podiatry.   5. ITP - followed by hematology.   6. Breast Cancer - continue treatment per oncology.   I spent 20 minutes dedicated to the care of this patient on the date of this encounter.  Follow Up:  In Person in 3 month(s)  Signed,  [name], MD  02/22/2020 9:00 PM    [place]

## 2020-02-11 ENCOUNTER — Ambulatory Visit: Payer: PPO

## 2020-02-11 ENCOUNTER — Telehealth (INDEPENDENT_AMBULATORY_CARE_PROVIDER_SITE_OTHER): Payer: PPO | Admitting: Family Medicine

## 2020-02-11 VITALS — BP 113/69 | HR 100 | Ht 62.0 in | Wt 212.0 lb

## 2020-02-11 DIAGNOSIS — D693 Immune thrombocytopenic purpura: Secondary | ICD-10-CM | POA: Diagnosis not present

## 2020-02-11 DIAGNOSIS — Z171 Estrogen receptor negative status [ER-]: Secondary | ICD-10-CM | POA: Diagnosis not present

## 2020-02-11 DIAGNOSIS — F330 Major depressive disorder, recurrent, mild: Secondary | ICD-10-CM | POA: Diagnosis not present

## 2020-02-11 DIAGNOSIS — L03032 Cellulitis of left toe: Secondary | ICD-10-CM | POA: Diagnosis not present

## 2020-02-11 DIAGNOSIS — C50311 Malignant neoplasm of lower-inner quadrant of right female breast: Secondary | ICD-10-CM

## 2020-02-11 DIAGNOSIS — E1121 Type 2 diabetes mellitus with diabetic nephropathy: Secondary | ICD-10-CM

## 2020-02-11 DIAGNOSIS — E119 Type 2 diabetes mellitus without complications: Secondary | ICD-10-CM

## 2020-02-11 DIAGNOSIS — R600 Localized edema: Secondary | ICD-10-CM

## 2020-02-12 ENCOUNTER — Other Ambulatory Visit: Payer: Self-pay | Admitting: Hematology and Oncology

## 2020-02-12 ENCOUNTER — Inpatient Hospital Stay: Payer: PPO

## 2020-02-12 ENCOUNTER — Other Ambulatory Visit: Payer: Self-pay

## 2020-02-12 DIAGNOSIS — C50311 Malignant neoplasm of lower-inner quadrant of right female breast: Secondary | ICD-10-CM | POA: Diagnosis not present

## 2020-02-12 DIAGNOSIS — D649 Anemia, unspecified: Secondary | ICD-10-CM | POA: Diagnosis not present

## 2020-02-12 DIAGNOSIS — Z171 Estrogen receptor negative status [ER-]: Secondary | ICD-10-CM

## 2020-02-12 LAB — CBC AND DIFFERENTIAL
HCT: 27 — AB (ref 36–46)
Hemoglobin: 9.1 — AB (ref 12.0–16.0)
Neutrophils Absolute: 4
Platelets: 124 — AB (ref 150–399)
WBC: 5.4

## 2020-02-12 LAB — HEPATIC FUNCTION PANEL
ALT: 21 (ref 7–35)
AST: 30 (ref 13–35)
Alkaline Phosphatase: 113 (ref 25–125)
Bilirubin, Total: 0.7

## 2020-02-12 LAB — BASIC METABOLIC PANEL
BUN: 9 (ref 4–21)
CO2: 26 — AB (ref 13–22)
Chloride: 101 (ref 99–108)
Creatinine: 0.7 (ref 0.5–1.1)
Glucose: 361
Potassium: 4.4 (ref 3.4–5.3)
Sodium: 135 — AB (ref 137–147)

## 2020-02-12 LAB — CBC: RBC: 2.91 — AB (ref 3.87–5.11)

## 2020-02-12 LAB — COMPREHENSIVE METABOLIC PANEL
Albumin: 3.9 (ref 3.5–5.0)
Calcium: 9.6 (ref 8.7–10.7)

## 2020-02-13 ENCOUNTER — Other Ambulatory Visit: Payer: Self-pay | Admitting: Hematology and Oncology

## 2020-02-13 ENCOUNTER — Ambulatory Visit: Payer: PPO

## 2020-02-14 NOTE — Progress Notes (Signed)
[place]  [address] [place],  [place]  [address] [phone]  Clinic Day:  02/17/2020  Referring physician: [name], MD   This document serves as a record of services personally performed by [name], MD. It was created on their behalf by [facility], a trained medical scribe. The creation of this record is based on the scribe's personal observations and the provider's statements to them.   CHIEF COMPLAINT:  CC: Breast cancer  Current Treatment:  AC for 4 doses to be followed by weekly Taxol for 12 doses   HISTORY OF PRESENT ILLNESS:  Linda [name] is a 50 y.o. female who we began seeing in February 2018 for evaluation of anemia.  Her anemia was felt to be secondary to iron deficiency and we recommended continuation of oral iron supplementation for a total of 6 months, as well as referral back to the gastroenterologist.  The patient also has a history of thrombocytopenia and had previously seen Dr. Bobby Rumpf in 2014.  The thrombocytopenia was felt to be secondary to mild chronic immune thrombocytopenic purpura (ITP).  Due to the patient's family history of malignancy, she underwent testing for hereditary cancer syndromes with the Myriad myRisk Hereditary Cancer Panel test.  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  She underwent a robotic hysterectomy and bilateral salpingo-oophorectomy in May 2018 with Dr. Everitt Amber.  Pathology was benign.  We also discussed the option of risk reducing bilateral mastectomy, but she has chosen to have close surveillance, so we recommended annual MRI breast, in addition to mammography.  She was placed on chemoprevention with raloxifene in September 2018.  CT abdomen and pelvis in August 2020 done for bilateral flank pain, revealed a tiny left renal calculus, as well as  probable hepatic cirrhosis without evidence of hepatic mass.  She was then referred to [facility] and is followed by Roosevelt Locks, CRNP in [place] for her liver cirrhosis.  She tested negative for hepatitis A, B, and C.  She has received her hepatitis vaccines in 2020.  We saw her in September 2021 for a new diagnosis of stage IB (T1c N0 M0) triple negative right breast cancer.  She underwent screening bilateral mammogram on August 3rd which revealed possible masses in the right breast.  Diagnostic right mammogram and right breast ultrasound from August 18th confirmed suspicious masses in the right breast at 5 o'clock measuring 1.5 cm and 6 o'clock measuring 1.4 cm.  There was an indeterminate 4 mm with possible distortion in the outer right breast without sonographic correlate.  Right axillary ultrasound was normal.  She then underwent biopsies of both masses and surgical pathology from these procedures revealed  invasive ductal carcinoma, grade 3, with necrosis at 5 o'clock; and invasive ductal carcinoma, grade 1-2, with necrosis and focal myxoid change at 6 o'clock.  No DCIS was identified.  Breast prognostic profiles revealed HER2, and estrogen and progesterone receptors to be negative. Ki67 was 30% at the 5 o'clock mass, and 40% at 6 o'clock.  She underwent bilateral mastectomies on September 24th with Dr. Noberto Retort, and surgical pathology from this procedure revealed invasive ductal carcinoma, grade 3, with myxoid change, 19 mm, at 6 o'clock, and invasive ductal carcinoma, grade 3, and ductal carcinoma in situ, 19 mm, at 5 o'clock.  All margins were negative for invasive carcinoma or  DCIS.  Two sentinel lymph nodes were negative for metastatic carcinoma (0/2).  CT chest, abdomen and pelvis in September did not reveal any evidence of metastatic disease.  She started dose dense AC chemotherapy on October 25th , with plans to follow this with weekly paclitaxel for 12 doses.  She had  severe restless leg syndrome with oral corticosteroids.    At her visit on November 29th prior to a 3rd cycle of AC chemotherapy she had significantly worsened anemia with a hemoglobin of 8.7, so we decided to wait 3 weeks for her 4th cycle of chemotherapy.  Due to cytopenias, this had to be delayed at least 2 more weeks, but she finally got her final dose on January 4th.  Iron studies, B12 and folate were normal.  INTERVAL HISTORY:  Linda [name] is here for follow up prior to starting weekly paclitaxel on January 26th.  She completed 4 cycles of AC chemotherapy in early January.  She states that she has been well and denies complaints.  We will need to schedule the last of her series of Hepatitis vaccines that were due at the end of December.  Her hemoglobin has mildly improved from 9.1 to 9.6, her platelet count has mildly decreased from 124,000 to 113,000, and her white count is normal.  Chemistries are unremarkable except for a sodium of 133, and a blood glucose of 454.  She has followed up with Dr. Tobie Poet and was advised to increase her insulin, and I again explained the importance of this and she will be on steroids during this treatment.  She will take 2 pills the night prior to treatment and two pills the morning of treatment.  Her  appetite is good, and she has lost 4 pounds since her last visit.  She denies fever, chills or other signs of infection.  She denies nausea, vomiting, bowel issues, or abdominal pain.  She denies sore throat, cough, dyspnea, or chest pain.  REVIEW OF SYSTEMS:  Review of Systems  Constitutional: Negative.   HENT:  Negative.   Eyes: Negative.   Respiratory: Negative.   Cardiovascular: Negative.   Gastrointestinal: Negative.   Endocrine: Negative.   Genitourinary: Negative.    Musculoskeletal: Negative.   Skin: Negative.   Neurological: Negative.   Hematological: Negative.   Psychiatric/Behavioral: Negative.      VITALS:  Blood pressure (!) 123/58, pulse 100, temperature  98.2 F (36.8 C), temperature source Oral, resp. rate 18, height 5' 2"  (1.575 m), weight 199 lb 11.2 oz (90.6 kg), last menstrual period 06/13/2009, SpO2 96 %.  Wt Readings from Last 3 Encounters:  02/17/20 199 lb 11.2 oz (90.6 kg)  02/11/20 212 lb (96.2 kg)  01/30/20 209 lb 12 oz (95.1 kg)    Body mass index is 36.53 kg/m.  Performance status (ECOG): 0 - Asymptomatic  PHYSICAL EXAM:  Physical Exam Constitutional:      General: She is not in acute distress.    Appearance: Normal appearance. She is normal weight.  HENT:     Head: Normocephalic and atraumatic.  Eyes:     General: No scleral icterus.    Extraocular Movements: Extraocular movements intact.     Conjunctiva/sclera: Conjunctivae normal.     Pupils: Pupils are equal, round, and reactive to light.  Cardiovascular:     Rate and Rhythm: Normal rate and regular rhythm.     Pulses: Normal pulses.     Heart sounds: Normal heart sounds. No murmur heard. No friction rub. No gallop.  Pulmonary:     Effort: Pulmonary effort is normal. No respiratory distress.     Breath sounds: Normal breath sounds.  Chest:     Comments: Port site is stable.  Bilateral mastectomies are negative. Abdominal:     General: Bowel sounds are normal. There is no distension.     Palpations: Abdomen is soft. There is hepatomegaly (7 cm below the right costal margin) and splenomegaly (just below the left costal margin). There is no mass.     Tenderness: There is no abdominal tenderness.  Musculoskeletal:        General: Normal range of motion.     Cervical back: Normal range of motion and neck supple.     Right lower leg: No edema.     Left lower leg: No edema.  Lymphadenopathy:     Cervical: No cervical adenopathy.  Skin:    General: Skin is warm and dry.  Neurological:     General: No focal deficit present.     Mental Status: She is alert and oriented to person, place, and time. Mental status is at baseline.  Psychiatric:        Mood and  Affect: Mood normal.        Behavior: Behavior normal.        Thought Content: Thought content normal.        Judgment: Judgment normal.     LABS:   CBC Latest Ref Rng & Units 02/12/2020 02/06/2020 01/27/2020  WBC - 5.4 1.0 4.9  Hemoglobin 12.0 - 16.0 9.1(A) 8.6(A) 10.4(A)  Hematocrit 36 - 46 27(A) 26(A) 32(A)  Platelets 150 - 399 124(A) 12(A) 77(A)   CMP Latest Ref Rng & Units 02/12/2020 02/06/2020 01/27/2020  Glucose 70 - 99 mg/dL - - -  BUN 4 - 21 9 10 8   Creatinine 0.5 - 1.1 0.7 0.7 0.7  Sodium 137 - 147 135(A) 135(A) 134(A)  Potassium 3.4 - 5.3 4.4 3.8 3.5  Chloride 99 - 108 101 102 102  CO2 13 - 22 26(A) 24(A) 22  Calcium 8.7 - 10.7 9.6 9.0 9.5  Total Protein 6.5 - 8.1 g/dL - - -  Total Bilirubin 0.3 - 1.2 mg/dL - - -  Alkaline Phos 25 - 125 113 90 99  AST 13 - 35 30 18 35  ALT 7 - 35 21 18 25     STUDIES:  No results found.   Allergies:  Allergies  Allergen Reactions  . Codeine Shortness Of Breath  . Celecoxib Other (See Comments)    Unknown reaction  . Ezetimibe-Simvastatin Other (See Comments)    Unknown reaction  . Propranolol Hcl Other (See Comments)    Unknown reaction    Current Medications: Current Outpatient Medications  Medication Sig Dispense Refill  . benzonatate (TESSALON) 100 MG capsule Take 1 capsule (100 mg total) by mouth 3 (three) times daily as needed for cough. 30 capsule 0  . Blood Glucose Monitoring Suppl (ONETOUCH VERIO REFLECT) w/Device KIT AS DIRECTED    . buPROPion (WELLBUTRIN XL) 300 MG 24 hr tablet TAKE ONE TABLET BY MOUTH EVERY EVENING 90 tablet 1  . calcium citrate-vitamin D (CITRACAL+D) 315-200 MG-UNIT tablet Take 1 tablet by mouth 2 (two) times daily.    . clotrimazole-betamethasone (LOTRISONE) cream Apply small amount to affected area twice daily (Patient taking differently: as needed. Apply small amount to affected area twice daily) 45 g 0  . Continuous Blood Gluc Receiver ([place]) DEVI 1 each by Does not apply route  in  the morning, at noon, and at bedtime. 1 each 3  . Continuous Blood Gluc Sensor (DEXCOM G6 SENSOR) MISC Check sugars qac and qhs. 9 each 3  . Continuous Blood Gluc Transmit (DEXCOM G6 TRANSMITTER) MISC 1 each by Does not apply route every 3 (three) months. 1 each 3  . cyclobenzaprine (FLEXERIL) 10 MG tablet Take 10 mg by mouth every 8 (eight) hours as needed.    Marland Kitchen dexamethasone (DECADRON) 4 MG tablet Take 4 mg by mouth 2 (two) times daily with a meal.    . dicyclomine (BENTYL) 20 MG tablet TAKE ONE TABLET BY MOUTH BEFORE MEALS AND AT BEDTIME AS NEEDED FOR STOMACH CRAMPING 180 tablet 1  . Dulaglutide (TRULICITY) 0.86 VH/8.4ON SOPN INJECT ONE SYRINGE ONCE A WEEK 6 mL 1  . Empagliflozin-Linaglip-Metform 5-2.05-998 MG TB24 Take 1,000 mg by mouth daily.    . Empagliflozin-metFORMIN HCl (SYNJARDY) 12.05-998 MG TABS Take 1 tablet by mouth daily. 90 tablet 0  . famotidine (PEPCID) 20 MG tablet TAKE ONE TABLET BY MOUTH TWICE DAILY 180 tablet 1  . ferrous sulfate 325 (65 FE) MG tablet Take 325 mg by mouth every evening.    Marland Kitchen FETZIMA 80 MG CP24 TAKE ONE CAPSULE BY MOUTH ONCE DAILY 90 capsule 1  . gabapentin (NEURONTIN) 300 MG capsule Take 1 capsule (300 mg total) by mouth 3 (three) times daily. 270 capsule 0  . glucose blood test strip 1 each by Other route in the morning and at bedtime. One Touch Verio - Patient checks blood sugar 2-3 times daily. 100 each 2  . insulin aspart (NOVOLOG FLEXPEN) 100 UNIT/ML FlexPen Three times before meals while on prednisone. 15 mL 11  . Insulin Pen Needle (BD PEN NEEDLE NANO U/F) 32G X 4 MM MISC Inject 1 each into the skin in the morning and at bedtime. 200 each 3  . LANTUS SOLOSTAR 100 UNIT/ML Solostar Pen INJECT 68 UNITS SUBCUTANEOUSLY EVERYDAY AT BEDTIME 15 mL 2  . levothyroxine (SYNTHROID) 75 MCG tablet TAKE ONE TABLET BY MOUTH ONCE DAILY 90 tablet 1  . loratadine (CLARITIN) 10 MG tablet Take 10 mg by mouth daily. Taking 3-4 days each week due to bone pain associated with  shot from cancer center.    Marland Kitchen LORazepam (ATIVAN) 0.5 MG tablet TAKE ONE TABLET BY MOUTH TWICE DAILY AS NEEDED FOR ANXIETY 30 tablet 2  . losartan (COZAAR) 50 MG tablet TAKE ONE TABLET BY MOUTH EVERY EVENING 90 tablet 1  . morphine (MS CONTIN) 30 MG 12 hr tablet Take 1 tablet (30 mg total) by mouth every 12 (twelve) hours. 180 tablet 0  . Multiple Vitamin (MULTIVITAMIN WITH MINERALS) TABS tablet Take 1 tablet by mouth daily.     . ondansetron (ZOFRAN) 4 MG tablet Take 1 tablet (4 mg total) by mouth every 4 (four) hours as needed for nausea. 90 tablet 3  . OneTouch Delica Lancets 62X MISC 1 each by Does not apply route daily before breakfast. Check blood sugar twice daily. 100 each 3  . pantoprazole (PROTONIX) 40 MG tablet Take 1 tablet (40 mg total) by mouth 2 (two) times daily. 180 tablet 1  . Pegfilgrastim-jmdb (FULPHILA [place]) Inject into the skin.    . potassium chloride (MICRO-K) 10 MEQ CR capsule TAKE TWO CAPSULES BY MOUTH TWICE DAILY 180 capsule 1  . pravastatin (PRAVACHOL) 40 MG tablet Take 1 tablet (40 mg total) by mouth daily. 90 tablet 0  . prochlorperazine (COMPAZINE) 10 MG tablet Take 1 tablet (10  mg total) by mouth every 6 (six) hours as needed for nausea or vomiting. 90 tablet 3  . senna (SENOKOT) 8.6 MG tablet Take 1 tablet by mouth daily as needed for constipation.    . SODIUM FLUORIDE 5000 SENSITIVE 1.1-5 % GEL Take by mouth 2 (two) times daily.    Marland Kitchen Specialty Vitamins Products (MAGNESIUM, AMINO ACID CHELATE,) 133 MG tablet Take 1 tablet by mouth at bedtime.    . sucralfate (CARAFATE) 1 g tablet Take 1 tablet (1 g total) by mouth 4 (four) times daily -  with meals and at bedtime. 270 tablet 1  . Vitamin D, Ergocalciferol, (DRISDOL) 1.25 MG (50000 UNIT) CAPS capsule TAKE ONE CAPSULE BY MOUTH ONCE WEEKLY ON FRIDAY 12 capsule 1  . zolpidem (AMBIEN) 10 MG tablet TAKE ONE TABLET BY MOUTH EVERYDAY AT BEDTIME AS NEEDED 90 tablet 0   No current facility-administered medications for this  visit.     ASSESSMENT & PLAN:   Assessment:   1. Positive BRCA 1 mutation, which increases her risk for breast and ovarian cancer, as well as elevates her risk for pancreatic cancer, for which she underwent hysterectomy/bilateral salpingo-oophorectomy and is on chemoprevention with raloxifene and regular surveillance.   She initially declined bilateral prophylactic mastectomies.   2.  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  She has now undergone bilateral mastectomies.  She started [facility] chemotherapy on October 25th, we will plan for 4 doses followed by 12 weeks of weekly Taxol.  3.  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  I think this is a separate primary.    4. Thrombocytopenia which is felt to be due to chronic ITP and chemotherapy, mildly worse.    5. Liver cirrhosis as seen on CT imaging in August 2020.  She has been referred and is being seen by Roosevelt Locks, CRNP, at the [facility] in [place].  Hepatitis panel was negative, and she has received the appropriate vaccines.  Abdominal ultrasound from April was stable and CT imaging from September was stable.    6. Anemia secondary to chemotherapy, which has improved.  Iron studies, B12 and folate were normal.   We will continue to monitor this.   7.  Poorly controlled diabetes.  Dr. Tobie Poet has instructed her to increase her insulin doses, but she has not been following these instructions.  She will be on steroid premedication with weekly paclitaxel and I again explained the importance of regulating her levels during this time.  If she has no difficulties with the Taxol, we will probably eliminate the steroid premedication altogether.  She understands and plans to get in better  control.    Plan: She completed 4 cycles of AC chemotherapy in early January and is scheduled to receive her 1st dose of weekly paclitaxel on January 26th.  Dr. Tobie Poet has instructed her to increase her insulin doses as her glucose levels have been poorly controlled, and she will be on steroid premedication during this time.  She does admit she has not been adhering to this plan.  I again explained the importance of regulating her blood glucose levels and she expressed understanding.  She will continue with weekly labs on Mondays and treatment on Wednesdays.  She will return in 1  week with CBC, CMP and hemoglobin A1c for nadir check.  We will then see her back 2 weeks following with CBC and CMP prior to her another cycle.  The patient understands the plans discussed today and is in agreement with them.  She knows to contact our office if her symptoms do not improve.  I provided 25 minutes of face-to-face time during this this encounter and > 50% was spent counseling as documented under my assessment and plan.    Derwood Kaplan, MD [facility] AT [facility] [address] [place] [address] Dept: [phone] Dept Fax: [phone]    I, Rita Ohara, am acting as scribe for Derwood Kaplan, MD  I have reviewed this report as typed by the medical scribe, and it is complete and accurate.

## 2020-02-17 ENCOUNTER — Other Ambulatory Visit: Payer: Self-pay

## 2020-02-17 ENCOUNTER — Other Ambulatory Visit: Payer: Self-pay | Admitting: Oncology

## 2020-02-17 ENCOUNTER — Inpatient Hospital Stay: Payer: PPO

## 2020-02-17 ENCOUNTER — Other Ambulatory Visit: Payer: Self-pay | Admitting: Hematology and Oncology

## 2020-02-17 ENCOUNTER — Encounter: Payer: Self-pay | Admitting: Oncology

## 2020-02-17 ENCOUNTER — Telehealth: Payer: Self-pay | Admitting: Oncology

## 2020-02-17 ENCOUNTER — Inpatient Hospital Stay (INDEPENDENT_AMBULATORY_CARE_PROVIDER_SITE_OTHER): Payer: PPO | Admitting: Oncology

## 2020-02-17 VITALS — BP 123/58 | HR 100 | Temp 98.2°F | Resp 18 | Ht 62.0 in | Wt 199.7 lb

## 2020-02-17 DIAGNOSIS — Z9013 Acquired absence of bilateral breasts and nipples: Secondary | ICD-10-CM | POA: Diagnosis not present

## 2020-02-17 DIAGNOSIS — K7460 Unspecified cirrhosis of liver: Secondary | ICD-10-CM

## 2020-02-17 DIAGNOSIS — E1165 Type 2 diabetes mellitus with hyperglycemia: Secondary | ICD-10-CM

## 2020-02-17 DIAGNOSIS — C50311 Malignant neoplasm of lower-inner quadrant of right female breast: Secondary | ICD-10-CM | POA: Diagnosis not present

## 2020-02-17 DIAGNOSIS — Z90710 Acquired absence of both cervix and uterus: Secondary | ICD-10-CM | POA: Diagnosis not present

## 2020-02-17 DIAGNOSIS — D6481 Anemia due to antineoplastic chemotherapy: Secondary | ICD-10-CM

## 2020-02-17 DIAGNOSIS — Z90722 Acquired absence of ovaries, bilateral: Secondary | ICD-10-CM | POA: Diagnosis not present

## 2020-02-17 DIAGNOSIS — D696 Thrombocytopenia, unspecified: Secondary | ICD-10-CM

## 2020-02-17 DIAGNOSIS — Z9079 Acquired absence of other genital organ(s): Secondary | ICD-10-CM | POA: Diagnosis not present

## 2020-02-17 DIAGNOSIS — Z171 Estrogen receptor negative status [ER-]: Secondary | ICD-10-CM

## 2020-02-17 DIAGNOSIS — D649 Anemia, unspecified: Secondary | ICD-10-CM | POA: Diagnosis not present

## 2020-02-17 DIAGNOSIS — Z79899 Other long term (current) drug therapy: Secondary | ICD-10-CM | POA: Diagnosis not present

## 2020-02-17 DIAGNOSIS — R634 Abnormal weight loss: Secondary | ICD-10-CM | POA: Diagnosis not present

## 2020-02-17 DIAGNOSIS — T451X5A Adverse effect of antineoplastic and immunosuppressive drugs, initial encounter: Secondary | ICD-10-CM

## 2020-02-17 LAB — COMPREHENSIVE METABOLIC PANEL
Albumin: 4.1 (ref 3.5–5.0)
Calcium: 9.7 (ref 8.7–10.7)

## 2020-02-17 LAB — BASIC METABOLIC PANEL
BUN: 10 (ref 4–21)
CO2: 28 — AB (ref 13–22)
Chloride: 97 — AB (ref 99–108)
Creatinine: 0.8 (ref 0.5–1.1)
Glucose: 454
Potassium: 4 (ref 3.4–5.3)
Sodium: 133 — AB (ref 137–147)

## 2020-02-17 LAB — HEPATIC FUNCTION PANEL
ALT: 22 (ref 7–35)
AST: 31 (ref 13–35)
Alkaline Phosphatase: 115 (ref 25–125)
Bilirubin, Total: 0.8

## 2020-02-17 LAB — CBC AND DIFFERENTIAL
HCT: 29 — AB (ref 36–46)
Hemoglobin: 9.6 — AB (ref 12.0–16.0)
Neutrophils Absolute: 4.47
Platelets: 113 — AB (ref 150–399)
WBC: 5.8

## 2020-02-17 LAB — CBC: RBC: 3.04 — AB (ref 3.87–5.11)

## 2020-02-19 ENCOUNTER — Other Ambulatory Visit: Payer: Self-pay

## 2020-02-19 ENCOUNTER — Inpatient Hospital Stay: Payer: PPO

## 2020-02-19 VITALS — BP 138/61 | HR 113 | Temp 98.1°F | Resp 18 | Ht 62.0 in | Wt 203.8 lb

## 2020-02-19 DIAGNOSIS — C50311 Malignant neoplasm of lower-inner quadrant of right female breast: Secondary | ICD-10-CM

## 2020-02-19 DIAGNOSIS — Z171 Estrogen receptor negative status [ER-]: Secondary | ICD-10-CM

## 2020-02-19 DIAGNOSIS — Z5111 Encounter for antineoplastic chemotherapy: Secondary | ICD-10-CM | POA: Diagnosis not present

## 2020-02-19 MED ORDER — HEPARIN SOD (PORK) LOCK FLUSH 100 UNIT/ML IV SOLN
500.0000 [IU] | Freq: Once | INTRAVENOUS | Status: DC | PRN
  Filled 2020-02-19: qty 5

## 2020-02-19 MED ORDER — DIPHENHYDRAMINE HCL 50 MG/ML IJ SOLN
50.0000 mg | Freq: Once | INTRAMUSCULAR | Status: AC
  Administered 2020-02-19: 50 mg via INTRAVENOUS

## 2020-02-19 MED ORDER — SODIUM CHLORIDE 0.9 % IV SOLN
80.0000 mg/m2 | Freq: Once | INTRAVENOUS | Status: AC
  Administered 2020-02-19: 162 mg via INTRAVENOUS
  Filled 2020-02-19: qty 27

## 2020-02-19 MED ORDER — SODIUM CHLORIDE 0.9 % IV SOLN
Freq: Once | INTRAVENOUS | Status: AC
  Filled 2020-02-19: qty 250

## 2020-02-19 MED ORDER — DIPHENHYDRAMINE HCL 50 MG/ML IJ SOLN
INTRAMUSCULAR | Status: AC
  Filled 2020-02-19: qty 1

## 2020-02-19 MED ORDER — FAMOTIDINE IN NACL 20-0.9 MG/50ML-% IV SOLN
INTRAVENOUS | Status: AC
  Filled 2020-02-19: qty 50

## 2020-02-19 MED ORDER — FAMOTIDINE IN NACL 20-0.9 MG/50ML-% IV SOLN
20.0000 mg | Freq: Once | INTRAVENOUS | Status: AC
  Administered 2020-02-19: 20 mg via INTRAVENOUS

## 2020-02-19 MED ORDER — DEXAMETHASONE SODIUM PHOSPHATE 10 MG/ML IJ SOLN
INTRAMUSCULAR | Status: AC
  Filled 2020-02-19: qty 1

## 2020-02-19 MED ORDER — DEXAMETHASONE SODIUM PHOSPHATE 10 MG/ML IJ SOLN
8.0000 mg | Freq: Once | INTRAMUSCULAR | Status: AC
  Administered 2020-02-19: 8 mg via INTRAVENOUS

## 2020-02-19 NOTE — Patient Instructions (Signed)
Paclitaxel injection What is this medicine? PACLITAXEL (PAK li TAX el) is a chemotherapy drug. It targets fast dividing cells, like cancer cells, and causes these cells to die. This medicine is used to treat ovarian cancer, breast cancer, lung cancer, Kaposi's sarcoma, and other cancers. This medicine may be used for other purposes; ask your health care provider or pharmacist if you have questions. COMMON BRAND NAME(S): Onxol, Taxol What should I tell my health care provider before I take this medicine? They need to know if you have any of these conditions:  history of irregular heartbeat  liver disease  low blood counts, like low white cell, platelet, or red cell counts  lung or breathing disease, like asthma  tingling of the fingers or toes, or other nerve disorder  an unusual or allergic reaction to paclitaxel, alcohol, polyoxyethylated castor oil, other chemotherapy, other medicines, foods, dyes, or preservatives  pregnant or trying to get pregnant  breast-feeding How should I use this medicine? This drug is given as an infusion into a vein. It is administered in a hospital or clinic by a specially trained health care professional. Talk to your pediatrician regarding the use of this medicine in children. Special care may be needed. Overdosage: If you think you have taken too much of this medicine contact a poison control center or emergency room at once. NOTE: This medicine is only for you. Do not share this medicine with others. What if I miss a dose? It is important not to miss your dose. Call your doctor or health care professional if you are unable to keep an appointment. What may interact with this medicine? Do not take this medicine with any of the following medications:  live virus vaccines This medicine may also interact with the following medications:  antiviral medicines for hepatitis, HIV or AIDS  certain antibiotics like erythromycin and clarithromycin  certain  medicines for fungal infections like ketoconazole and itraconazole  certain medicines for seizures like carbamazepine, phenobarbital, phenytoin  gemfibrozil  nefazodone  rifampin  St. John's wort This list may not describe all possible interactions. Give your health care provider a list of all the medicines, herbs, non-prescription drugs, or dietary supplements you use. Also tell them if you smoke, drink alcohol, or use illegal drugs. Some items may interact with your medicine. What should I watch for while using this medicine? Your condition will be monitored carefully while you are receiving this medicine. You will need important blood work done while you are taking this medicine. This medicine can cause serious allergic reactions. To reduce your risk you will need to take other medicine(s) before treatment with this medicine. If you experience allergic reactions like skin rash, itching or hives, swelling of the face, lips, or tongue, tell your doctor or health care professional right away. In some cases, you may be given additional medicines to help with side effects. Follow all directions for their use. This drug may make you feel generally unwell. This is not uncommon, as chemotherapy can affect healthy cells as well as cancer cells. Report any side effects. Continue your course of treatment even though you feel ill unless your doctor tells you to stop. Call your doctor or health care professional for advice if you get a fever, chills or sore throat, or other symptoms of a cold or flu. Do not treat yourself. This drug decreases your body's ability to fight infections. Try to avoid being around people who are sick. This medicine may increase your risk to bruise  or bleed. Call your doctor or health care professional if you notice any unusual bleeding. Be careful brushing and flossing your teeth or using a toothpick because you may get an infection or bleed more easily. If you have any dental  work done, tell your dentist you are receiving this medicine. Avoid taking products that contain aspirin, acetaminophen, ibuprofen, naproxen, or ketoprofen unless instructed by your doctor. These medicines may hide a fever. Do not become pregnant while taking this medicine. Women should inform their doctor if they wish to become pregnant or think they might be pregnant. There is a potential for serious side effects to an unborn child. Talk to your health care professional or pharmacist for more information. Do not breast-feed an infant while taking this medicine. Men are advised not to father a child while receiving this medicine. This product may contain alcohol. Ask your pharmacist or healthcare provider if this medicine contains alcohol. Be sure to tell all healthcare providers you are taking this medicine. Certain medicines, like metronidazole and disulfiram, can cause an unpleasant reaction when taken with alcohol. The reaction includes flushing, headache, nausea, vomiting, sweating, and increased thirst. The reaction can last from 30 minutes to several hours. What side effects may I notice from receiving this medicine? Side effects that you should report to your doctor or health care professional as soon as possible:  allergic reactions like skin rash, itching or hives, swelling of the face, lips, or tongue  breathing problems  changes in vision  fast, irregular heartbeat  high or low blood pressure  mouth sores  pain, tingling, numbness in the hands or feet  signs of decreased platelets or bleeding - bruising, pinpoint red spots on the skin, black, tarry stools, blood in the urine  signs of decreased red blood cells - unusually weak or tired, feeling faint or lightheaded, falls  signs of infection - fever or chills, cough, sore throat, pain or difficulty passing urine  signs and symptoms of liver injury like dark yellow or brown urine; general ill feeling or flu-like symptoms;  light-colored stools; loss of appetite; nausea; right upper belly pain; unusually weak or tired; yellowing of the eyes or skin  swelling of the ankles, feet, hands  unusually slow heartbeat Side effects that usually do not require medical attention (report to your doctor or health care professional if they continue or are bothersome):  diarrhea  hair loss  loss of appetite  muscle or joint pain  nausea, vomiting  pain, redness, or irritation at site where injected  tiredness This list may not describe all possible side effects. Call your doctor for medical advice about side effects. You may report side effects to FDA at 1-800-FDA-1088. Where should I keep my medicine? This drug is given in a hospital or clinic and will not be stored at home. NOTE: This sheet is a summary. It may not cover all possible information. If you have questions about this medicine, talk to your doctor, pharmacist, or health care provider.  2021 Elsevier/Gold Standard (2018-12-12 13:37:23)

## 2020-02-21 DIAGNOSIS — S90422A Blister (nonthermal), left great toe, initial encounter: Secondary | ICD-10-CM | POA: Diagnosis not present

## 2020-02-21 DIAGNOSIS — L97521 Non-pressure chronic ulcer of other part of left foot limited to breakdown of skin: Secondary | ICD-10-CM | POA: Diagnosis not present

## 2020-02-21 DIAGNOSIS — E1142 Type 2 diabetes mellitus with diabetic polyneuropathy: Secondary | ICD-10-CM | POA: Diagnosis not present

## 2020-02-21 DIAGNOSIS — E11621 Type 2 diabetes mellitus with foot ulcer: Secondary | ICD-10-CM | POA: Diagnosis not present

## 2020-02-21 NOTE — Telephone Encounter (Signed)
Pt returned my call. I asked pt how she has been doing since infusion on 02/19/2020. She states, "I'm doing good". Denies N/V, skin rash, & diarrhea. She admits to constipation and is taking senna, & has mag citrate available In home if needed. I told her we don't like for pt's to go more than 2 days without a BM. I reminded pt of importance of calling us day or  night @ [phone], if her temp is 100.4 or over. Pt verbalized understanding.

## 2020-02-22 ENCOUNTER — Encounter: Payer: Self-pay | Admitting: Family Medicine

## 2020-02-24 ENCOUNTER — Inpatient Hospital Stay (INDEPENDENT_AMBULATORY_CARE_PROVIDER_SITE_OTHER): Payer: PPO | Admitting: Hematology and Oncology

## 2020-02-24 ENCOUNTER — Other Ambulatory Visit: Payer: Self-pay

## 2020-02-24 ENCOUNTER — Encounter: Payer: Self-pay | Admitting: Hematology and Oncology

## 2020-02-24 ENCOUNTER — Inpatient Hospital Stay: Payer: PPO

## 2020-02-24 ENCOUNTER — Encounter: Payer: Self-pay | Admitting: Oncology

## 2020-02-24 VITALS — BP 130/68 | HR 101 | Temp 98.6°F | Resp 18 | Ht 62.0 in | Wt 200.0 lb

## 2020-02-24 DIAGNOSIS — Z171 Estrogen receptor negative status [ER-]: Secondary | ICD-10-CM | POA: Diagnosis not present

## 2020-02-24 DIAGNOSIS — R7301 Impaired fasting glucose: Secondary | ICD-10-CM | POA: Diagnosis not present

## 2020-02-24 DIAGNOSIS — D649 Anemia, unspecified: Secondary | ICD-10-CM | POA: Diagnosis not present

## 2020-02-24 DIAGNOSIS — C50311 Malignant neoplasm of lower-inner quadrant of right female breast: Secondary | ICD-10-CM | POA: Diagnosis not present

## 2020-02-24 LAB — COMPREHENSIVE METABOLIC PANEL
Albumin: 4.1 (ref 3.5–5.0)
Calcium: 9.3 (ref 8.7–10.7)

## 2020-02-24 LAB — CBC
MCV: 95 (ref 81–99)
RBC: 3.17 — AB (ref 3.87–5.11)

## 2020-02-24 LAB — BASIC METABOLIC PANEL
BUN: 12 (ref 4–21)
CO2: 24 — AB (ref 13–22)
Chloride: 102 (ref 99–108)
Creatinine: 0.8 (ref 0.5–1.1)
Glucose: 199
Potassium: 4.1 (ref 3.4–5.3)
Sodium: 136 — AB (ref 137–147)

## 2020-02-24 LAB — CBC AND DIFFERENTIAL
HCT: 30 — AB (ref 36–46)
Hemoglobin: 10.1 — AB (ref 12.0–16.0)
Neutrophils Absolute: 4.24
Platelets: 130 — AB (ref 150–399)
WBC: 5.5

## 2020-02-24 LAB — HEPATIC FUNCTION PANEL
ALT: 24 (ref 7–35)
AST: 34 (ref 13–35)
Alkaline Phosphatase: 86 (ref 25–125)
Bilirubin, Total: 0.6

## 2020-02-24 NOTE — Progress Notes (Signed)
[facility]  [address] [place],    [id number] [phone]  Clinic Day:  02/24/2020  Referring physician: [name], MD    CHIEF COMPLAINT:  CC:  Stage IB triple negative breast cancer  Current Treatment:  AC for 4 doses to be followed by weekly Taxol for 12 doses   HISTORY OF PRESENT ILLNESS:  Linda [name] is a 50 y.o. female who we began seeing in February 2018 for evaluation of anemia.  Her anemia was felt to be secondary to iron deficiency and we recommended continuation of oral iron supplementation for a total of 6 months, as well as referral back to the gastroenterologist.  The patient also has a history of thrombocytopenia and had previously seen Dr. Bobby Rumpf in 2014.  The thrombocytopenia was felt to be secondary to mild chronic immune thrombocytopenic purpura (ITP).  Due to the patient's family history of malignancy, she underwent testing for hereditary cancer syndromes with the [employer] myRisk Hereditary Cancer Panel test.  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  She underwent a robotic hysterectomy and bilateral salpingo-oophorectomy in May 2018 with Dr. Everitt Amber.  Pathology was benign.  We also discussed the option of risk reducing bilateral mastectomy, but she has chosen to have close surveillance, so we recommended annual MRI breast, in addition to mammography.  She was placed on chemoprevention with raloxifene in September 2018.  CT abdomen and pelvis in August 2020 done for bilateral flank pain, revealed a tiny left renal calculus, as well as probable hepatic cirrhosis without evidence of hepatic mass.  She was then referred to [facility] and is followed by Roosevelt Locks, CRNP in [place] for her liver cirrhosis.  She tested negative for hepatitis A, B, and C.  She has  received her hepatitis vaccines in 2020.  We saw her in September 2021 for a new diagnosis of stage IB (T1c N0 M0) triple negative right breast cancer.  She underwent screening bilateral mammogram on August 3rd which revealed possible masses in the right breast.  Diagnostic right mammogram and right breast ultrasound from August 18th confirmed suspicious masses in the right breast at 5 o'clock measuring 1.5 cm and 6 o'clock measuring 1.4 cm.  There was an indeterminate 4 mm with possible distortion in the outer right breast without sonographic correlate.  Right axillary ultrasound was normal.  She then underwent biopsies of both masses and surgical pathology from these procedures revealed  invasive ductal carcinoma, grade 3, with necrosis at 5 o'clock; and invasive ductal carcinoma, grade 1-2, with necrosis and focal myxoid change at 6 o'clock.  No DCIS was identified.  Breast prognostic profiles revealed HER2, and estrogen and progesterone receptors to be negative. Ki67 was 30% at the 5 o'clock mass, and 40% at 6 o'clock.  She underwent bilateral mastectomies on September 24th with Dr. Noberto Retort. Surgical pathology from this procedure revealed invasive ductal carcinoma, grade 3, with myxoid change, 19 mm, at 6 o'clock, and invasive ductal carcinoma, grade 3, and ductal carcinoma in situ, 19 mm, at 5 o'clock.  All margins were negative for invasive carcinoma or DCIS.  Two sentinel lymph nodes were negative for metastatic carcinoma (0/2). CT chest, abdomen and pelvis in September did not reveal any evidence of metastatic disease.  She started dose dense AC chemotherapy on October 25th , with plans to follow  this with weekly paclitaxel for 12 doses.  She had severe restless leg syndrome with oral corticosteroids.  At her visit on November 29th prior to a 3rd cycle of AC chemotherapy she had significantly worsened anemia with a hemoglobin of 8.7, so we decided to wait 3 weeks for her 4th cycle of chemotherapy.  Due to  cytopenias, this had to be delayed at least 2 more weeks, but she finally got her final dose on January 4th.  Iron studies, B12 and folate were normal.  She started weekly paclitaxel on January 26th.   She is taking dexamethasone 2m premedication for paclitaxel 2 tablets the night before and the morning of chemotherapy. She has pre-existing neuropathy of the feet and legs.  INTERVAL HISTORY:  AShellbyis here prior to cycle 2 of weekly paclitaxel. She states she tolerated her 1st cycle without difficulty.  She denies fevers or chills. She denies pain.  Her  appetite is good. Her weight has been stable.  She states her blood sugar has been under better control now, but was still over 200 this morning. She says when she tries to wear her regular bra with knitted prostheses, she has pain of her left chest wall.   I gave her information about [facility] as mastectomy bras and prosthesis will be more comfortable and should be covered by her insurance.  REVIEW OF SYSTEMS:  Review of Systems  Constitutional: Negative for appetite change, chills, fatigue, fever and unexpected weight change.  HENT:   Negative for lump/mass, mouth sores and sore throat.   Respiratory: Negative for cough and shortness of breath.   Cardiovascular: Negative for chest pain and leg swelling.  Gastrointestinal: Negative for abdominal pain, constipation, diarrhea, nausea and vomiting.  Genitourinary: Negative for difficulty urinating, dysuria, frequency and hematuria.   Musculoskeletal: Negative for arthralgias, back pain and myalgias.  Skin: Negative for rash.  Neurological: Negative for dizziness and headaches.  Psychiatric/Behavioral: Negative for depression and sleep disturbance. The patient is not nervous/anxious.      VITALS:  Blood pressure 130/68, pulse (!) 101, temperature 98.6 F (37 C), temperature source Oral, resp. rate 18, height 5' 2"  (1.575 m), weight 200 lb (90.7 kg), last menstrual period 06/13/2009, SpO2  94 %.  Wt Readings from Last 3 Encounters:  02/24/20 200 lb (90.7 kg)  02/19/20 203 lb 12 oz (92.4 kg)  02/17/20 199 lb 11.2 oz (90.6 kg)    Body mass index is 36.58 kg/m.  Performance status (ECOG): 0 - Asymptomatic  PHYSICAL EXAM:  Physical Exam Vitals and nursing note reviewed.  Constitutional:      General: She is not in acute distress.    Appearance: Normal appearance.  Eyes:     General: No scleral icterus.    Conjunctiva/sclera: Conjunctivae normal.  Cardiovascular:     Rate and Rhythm: Regular rhythm. Tachycardia present.     Heart sounds: No murmur heard. No friction rub. No gallop.   Pulmonary:     Effort: No respiratory distress.     Breath sounds: Normal breath sounds. No stridor. No wheezing, rhonchi or rales.  Chest:  Breasts:     Right: Absent. No axillary adenopathy or supraclavicular adenopathy.     Left: Absent. No axillary adenopathy or supraclavicular adenopathy.      Comments: Bilateral mastectomy sites are negative. Abdominal:     General: There is no distension.     Palpations: Abdomen is soft. There is no hepatomegaly, splenomegaly or mass.     Tenderness:  There is no abdominal tenderness. There is no guarding.     Hernia: No hernia is present.  Musculoskeletal:     Cervical back: Normal range of motion and neck supple. No tenderness.     Right lower leg: No edema.     Left lower leg: No edema.  Lymphadenopathy:     Cervical: No cervical adenopathy.     Upper Body:     Right upper body: No supraclavicular or axillary adenopathy.     Left upper body: No supraclavicular or axillary adenopathy.     Lower Body: No right inguinal adenopathy. No left inguinal adenopathy.  Skin:    General: Skin is warm and dry.     Coloration: Skin is not jaundiced.     Findings: No rash.  Neurological:     Mental Status: She is alert and oriented to person, place, and time.     Cranial Nerves: No cranial nerve deficit.  Psychiatric:        Mood and Affect:  Mood normal.        Behavior: Behavior normal.        Thought Content: Thought content normal.     LABS:   CBC Latest Ref Rng & Units 02/24/2020 02/17/2020 02/12/2020  WBC - 5.5 5.8 5.4  Hemoglobin 12.0 - 16.0 10.1(A) 9.6(A) 9.1(A)  Hematocrit 36 - 46 30(A) 29(A) 27(A)  Platelets 150 - 399 130(A) 113(A) 124(A)   CMP Latest Ref Rng & Units 02/24/2020 02/17/2020 02/12/2020  Glucose 70 - 99 mg/dL - - -  BUN 4 - 21 12 10 9   Creatinine 0.5 - 1.1 0.8 0.8 0.7  Sodium 137 - 147 136(A) 133(A) 135(A)  Potassium 3.4 - 5.3 4.1 4.0 4.4  Chloride 99 - 108 102 97(A) 101  CO2 13 - 22 24(A) 28(A) 26(A)  Calcium 8.7 - 10.7 9.3 9.7 9.6  Total Protein 6.5 - 8.1 g/dL - - -  Total Bilirubin 0.3 - 1.2 mg/dL - - -  Alkaline Phos 25 - 125 86 115 113  AST 13 - 35 34 31 30  ALT 7 - 35 24 22 21     STUDIES:  No results found.   Allergies:  Allergies  Allergen Reactions  . Codeine Shortness Of Breath  . Celecoxib Other (See Comments)    Unknown reaction  . Ezetimibe-Simvastatin Other (See Comments)    Unknown reaction  . Propranolol Hcl Other (See Comments)    Unknown reaction    Current Medications: Current Outpatient Medications  Medication Sig Dispense Refill  . potassium chloride (MICRO-K) 10 MEQ CR capsule Take by mouth.    . benzonatate (TESSALON) 100 MG capsule Take 1 capsule (100 mg total) by mouth 3 (three) times daily as needed for cough. 30 capsule 0  . Blood Glucose Monitoring Suppl (ONETOUCH VERIO REFLECT) w/Device KIT AS DIRECTED    . buPROPion (WELLBUTRIN XL) 300 MG 24 hr tablet TAKE ONE TABLET BY MOUTH EVERY EVENING 90 tablet 1  . calcium citrate-vitamin D (CITRACAL+D) 315-200 MG-UNIT tablet Take 1 tablet by mouth 2 (two) times daily.    . clotrimazole-betamethasone (LOTRISONE) cream Apply small amount to affected area twice daily (Patient taking differently: as needed. Apply small amount to affected area twice daily) 45 g 0  . Continuous Blood Gluc Receiver ([facility])  DEVI 1 each by Does not apply route in the morning, at noon, and at bedtime. 1 each 3  . Continuous Blood Gluc Sensor (DEXCOM G6 SENSOR) MISC Check sugars  qac and qhs. 9 each 3  . Continuous Blood Gluc Transmit (DEXCOM G6 TRANSMITTER) MISC 1 each by Does not apply route every 3 (three) months. 1 each 3  . cyclobenzaprine (FLEXERIL) 10 MG tablet Take 10 mg by mouth every 8 (eight) hours as needed.    Marland Kitchen dexamethasone (DECADRON) 4 MG tablet Take 4 mg by mouth 2 (two) times daily with a meal.    . dicyclomine (BENTYL) 20 MG tablet TAKE ONE TABLET BY MOUTH BEFORE MEALS AND AT BEDTIME AS NEEDED FOR STOMACH CRAMPING 180 tablet 1  . Dulaglutide (TRULICITY) 3.01 SW/1.0XN SOPN INJECT ONE SYRINGE ONCE A WEEK 6 mL 1  . Empagliflozin-Linaglip-Metform 5-2.05-998 MG TB24 Take 1,000 mg by mouth daily.    . Empagliflozin-metFORMIN HCl (SYNJARDY) 12.05-998 MG TABS Take 1 tablet by mouth daily. 90 tablet 0  . famotidine (PEPCID) 20 MG tablet TAKE ONE TABLET BY MOUTH TWICE DAILY 180 tablet 1  . ferrous sulfate 325 (65 FE) MG tablet Take 325 mg by mouth every evening.    Marland Kitchen FETZIMA 80 MG CP24 TAKE ONE CAPSULE BY MOUTH ONCE DAILY 90 capsule 1  . gabapentin (NEURONTIN) 300 MG capsule Take 1 capsule (300 mg total) by mouth 3 (three) times daily. 270 capsule 0  . glucose blood test strip 1 each by Other route in the morning and at bedtime. One Touch Verio - Patient checks blood sugar 2-3 times daily. 100 each 2  . insulin aspart (NOVOLOG FLEXPEN) 100 UNIT/ML FlexPen Three times before meals while on prednisone. 15 mL 11  . Insulin Pen Needle (BD PEN NEEDLE NANO U/F) 32G X 4 MM MISC Inject 1 each into the skin in the morning and at bedtime. 200 each 3  . LANTUS SOLOSTAR 100 UNIT/ML Solostar Pen INJECT 68 UNITS SUBCUTANEOUSLY EVERYDAY AT BEDTIME 15 mL 2  . levothyroxine (SYNTHROID) 75 MCG tablet TAKE ONE TABLET BY MOUTH ONCE DAILY 90 tablet 1  . loratadine (CLARITIN) 10 MG tablet Take 10 mg by mouth daily. Taking 3-4 days  each week due to bone pain associated with shot from cancer center.    Marland Kitchen LORazepam (ATIVAN) 0.5 MG tablet TAKE ONE TABLET BY MOUTH TWICE DAILY AS NEEDED FOR ANXIETY 30 tablet 2  . losartan (COZAAR) 50 MG tablet TAKE ONE TABLET BY MOUTH EVERY EVENING 90 tablet 1  . morphine (MS CONTIN) 30 MG 12 hr tablet Take 1 tablet (30 mg total) by mouth every 12 (twelve) hours. 180 tablet 0  . Multiple Vitamin (MULTIVITAMIN WITH MINERALS) TABS tablet Take 1 tablet by mouth daily.     . ondansetron (ZOFRAN) 4 MG tablet Take 1 tablet (4 mg total) by mouth every 4 (four) hours as needed for nausea. 90 tablet 3  . OneTouch Delica Lancets 23F MISC 1 each by Does not apply route daily before breakfast. Check blood sugar twice daily. 100 each 3  . pantoprazole (PROTONIX) 40 MG tablet Take 1 tablet (40 mg total) by mouth 2 (two) times daily. 180 tablet 1  . Pegfilgrastim-jmdb (FULPHILA Prue) Inject into the skin.    . pravastatin (PRAVACHOL) 40 MG tablet Take 1 tablet (40 mg total) by mouth daily. 90 tablet 0  . prochlorperazine (COMPAZINE) 10 MG tablet Take 1 tablet (10 mg total) by mouth every 6 (six) hours as needed for nausea or vomiting. 90 tablet 3  . senna (SENOKOT) 8.6 MG tablet Take 1 tablet by mouth daily as needed for constipation.    . SODIUM FLUORIDE 5000 SENSITIVE 1.1-5 %  GEL Take by mouth 2 (two) times daily.    Marland Kitchen Specialty Vitamins Products (MAGNESIUM, AMINO ACID CHELATE,) 133 MG tablet Take 1 tablet by mouth at bedtime.    . sucralfate (CARAFATE) 1 g tablet Take 1 tablet (1 g total) by mouth 4 (four) times daily -  with meals and at bedtime. 270 tablet 1  . Vitamin D, Ergocalciferol, (DRISDOL) 1.25 MG (50000 UNIT) CAPS capsule TAKE ONE CAPSULE BY MOUTH ONCE WEEKLY ON FRIDAY 12 capsule 1  . zolpidem (AMBIEN) 10 MG tablet TAKE ONE TABLET BY MOUTH EVERYDAY AT BEDTIME AS NEEDED 90 tablet 0   No current facility-administered medications for this visit.     ASSESSMENT & PLAN:   Assessment:   1. Positive  BRCA 1 mutation, which increases her risk for breast and ovarian cancer, as well as elevates her risk for pancreatic cancer, for which she underwent hysterectomy/bilateral salpingo-oophorectomy and is on chemoprevention with raloxifene and regular surveillance.   She initially declined bilateral prophylactic mastectomies.   2.  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  She has now undergone bilateral mastectomies.  She completed 4 cycles of Adriamycin/cyclophosphamide chemotherapy and is now receiving weekly paclitaxel and tolerated her 1st dose without difficulty.  3.  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  We feel this is a separate primary.    4. Thrombocytopenia, which is felt to be due to chronic ITP. This worsened with chemotherapy, but her platelets remain over 100,000 now.    5. Liver cirrhosis as seen on CT imaging in August 2020.  She has been referred and is being seen by Roosevelt Locks, CRNP, at the [facility] in [place].  Hepatitis panel was negative and she has received the appropriate vaccines.  Abdominal ultrasound from April 2021 was stable and CT imaging from September 2021 was stable.    6. Anemia secondary to chemotherapy, which has improved.  7.  Diabetes, which is under better control this week.  She is on steroid premedication for paclitaxel.  As she tolerated her 1st cycle without significant difficulty, we will decrease the dexamethasone 4 mg to 1 tablet the night before and the morning of chemotherapy.  If she does well again this week, we willI probably eliminate the steroid premedication altogether.   Plan:  She will proceed with a 2nd cycle of weekly paclitaxel on February 2nd and we will continue weekly  labs and paclitaxel.   We will plan to see her back in 2 weeks with a CBC and comprehensive metabolic panel for re-examination.  The patient understands the plans discussed today and is in agreement with them.  She knows to contact our office if she develops concerns prior to her next appointment.    Marvia Pickles, PA-C [facility] AT [facility] [address] [place] [address] Dept: [phone] Dept Fax: [phone]

## 2020-02-25 ENCOUNTER — Encounter: Payer: Self-pay | Admitting: Hematology and Oncology

## 2020-02-26 ENCOUNTER — Inpatient Hospital Stay: Payer: PPO | Attending: Oncology

## 2020-02-26 ENCOUNTER — Other Ambulatory Visit: Payer: Self-pay

## 2020-02-26 ENCOUNTER — Telehealth: Payer: PPO

## 2020-02-26 VITALS — BP 144/63 | HR 94 | Temp 98.4°F | Resp 18 | Ht 62.0 in | Wt 207.2 lb

## 2020-02-26 DIAGNOSIS — C50311 Malignant neoplasm of lower-inner quadrant of right female breast: Secondary | ICD-10-CM | POA: Diagnosis not present

## 2020-02-26 DIAGNOSIS — Z23 Encounter for immunization: Secondary | ICD-10-CM | POA: Diagnosis not present

## 2020-02-26 DIAGNOSIS — C50811 Malignant neoplasm of overlapping sites of right female breast: Secondary | ICD-10-CM | POA: Diagnosis not present

## 2020-02-26 DIAGNOSIS — R5383 Other fatigue: Secondary | ICD-10-CM | POA: Insufficient documentation

## 2020-02-26 DIAGNOSIS — Z5111 Encounter for antineoplastic chemotherapy: Secondary | ICD-10-CM | POA: Diagnosis not present

## 2020-02-26 DIAGNOSIS — Z79899 Other long term (current) drug therapy: Secondary | ICD-10-CM | POA: Insufficient documentation

## 2020-02-26 DIAGNOSIS — Z171 Estrogen receptor negative status [ER-]: Secondary | ICD-10-CM

## 2020-02-26 MED ORDER — PACLITAXEL CHEMO INJECTION 300 MG/50ML
80.0000 mg/m2 | Freq: Once | INTRAVENOUS | Status: AC
  Administered 2020-02-26: 162 mg via INTRAVENOUS
  Filled 2020-02-26: qty 27

## 2020-02-26 MED ORDER — HEPARIN SOD (PORK) LOCK FLUSH 100 UNIT/ML IV SOLN
500.0000 [IU] | Freq: Once | INTRAVENOUS | Status: AC | PRN
  Administered 2020-02-26: 500 [IU]
  Filled 2020-02-26: qty 5

## 2020-02-26 MED ORDER — SODIUM CHLORIDE 0.9 % IV SOLN
Freq: Once | INTRAVENOUS | Status: AC
  Filled 2020-02-26: qty 250

## 2020-02-26 MED ORDER — DIPHENHYDRAMINE HCL 50 MG/ML IJ SOLN
INTRAMUSCULAR | Status: AC
  Filled 2020-02-26: qty 1

## 2020-02-26 MED ORDER — DIPHENHYDRAMINE HCL 50 MG/ML IJ SOLN
50.0000 mg | Freq: Once | INTRAMUSCULAR | Status: AC
  Administered 2020-02-26: 50 mg via INTRAVENOUS

## 2020-02-26 MED ORDER — DEXAMETHASONE SODIUM PHOSPHATE 10 MG/ML IJ SOLN
8.0000 mg | Freq: Once | INTRAMUSCULAR | Status: AC
  Administered 2020-02-26: 8 mg via INTRAVENOUS

## 2020-02-26 MED ORDER — FAMOTIDINE IN NACL 20-0.9 MG/50ML-% IV SOLN
INTRAVENOUS | Status: AC
  Filled 2020-02-26: qty 50

## 2020-02-26 MED ORDER — FAMOTIDINE IN NACL 20-0.9 MG/50ML-% IV SOLN
20.0000 mg | Freq: Once | INTRAVENOUS | Status: AC
  Administered 2020-02-26: 20 mg via INTRAVENOUS

## 2020-02-26 MED ORDER — DEXAMETHASONE SODIUM PHOSPHATE 10 MG/ML IJ SOLN
INTRAMUSCULAR | Status: AC
  Filled 2020-02-26: qty 1

## 2020-02-26 NOTE — Chronic Care Management (AMB) (Unsigned)
Chronic Care Management Pharmacy  Name: Linda [name]  MRN: [id number] DOB: March 04, 1970  Chief Complaint/ HPI  Linda [name],  50 y.o. , female presents for their Follow-Up CCM visit with the clinical pharmacist via telephone due to COVID-19 Pandemic.  PCP : [name], MD  Their chronic conditions include: HTN, Dyslipidemia, Hypothyroidism, BRCA 1 positive, HLD, Depression, Vitamin D insufficiency, GERD, Gastritis, Anxiety, DM.  Office Visits: 02/11/2020 - continue synjardy 12.05/998 mg daily. 6 units of Novolog before meals if sugar >150.  10/09/2019 - flu shot given. Consider reduction in diabetes medication in the future if A1C stays excellent.   Consult Visit: 02/24/2020 - oncology visit. Continuing paclitaxel.  02/21/2020 - Podiatry - debridement of second toe ulcer performed.   Medications: Outpatient Encounter Medications as of 02/26/2020  Medication Sig Note  . benzonatate (TESSALON) 100 MG capsule Take 1 capsule (100 mg total) by mouth 3 (three) times daily as needed for cough.   . Blood Glucose Monitoring Suppl (ONETOUCH VERIO REFLECT) w/Device KIT AS DIRECTED   . buPROPion (WELLBUTRIN XL) 300 MG 24 hr tablet TAKE ONE TABLET BY MOUTH EVERY EVENING   . calcium citrate-vitamin D (CITRACAL+D) 315-200 MG-UNIT tablet Take 1 tablet by mouth 2 (two) times daily.   . clotrimazole-betamethasone (LOTRISONE) cream Apply small amount to affected area twice daily (Patient taking differently: as needed. Apply small amount to affected area twice daily)   . Continuous Blood Gluc Receiver ([place]) DEVI 1 each by Does not apply route in the morning, at noon, and at bedtime.   . Continuous Blood Gluc Sensor (DEXCOM G6 SENSOR) MISC Check sugars qac and qhs.   . Continuous Blood Gluc Transmit (DEXCOM G6 TRANSMITTER) MISC 1 each by Does not apply route every 3 (three) months.   . cyclobenzaprine (FLEXERIL) 10 MG tablet Take 10 mg by mouth every 8 (eight) hours as  needed.   Marland Kitchen dexamethasone (DECADRON) 4 MG tablet Take 4 mg by mouth 2 (two) times daily with a meal.   . dicyclomine (BENTYL) 20 MG tablet TAKE ONE TABLET BY MOUTH BEFORE MEALS AND AT BEDTIME AS NEEDED FOR STOMACH CRAMPING   . Dulaglutide (TRULICITY) 7.09 GG/8.3MO SOPN INJECT ONE SYRINGE ONCE A WEEK   . Empagliflozin-Linaglip-Metform 5-2.05-998 MG TB24 Take 1,000 mg by mouth daily.   . Empagliflozin-metFORMIN HCl (SYNJARDY) 12.05-998 MG TABS Take 1 tablet by mouth daily.   . famotidine (PEPCID) 20 MG tablet TAKE ONE TABLET BY MOUTH TWICE DAILY   . ferrous sulfate 325 (65 FE) MG tablet Take 325 mg by mouth every evening.   Marland Kitchen FETZIMA 80 MG CP24 TAKE ONE CAPSULE BY MOUTH ONCE DAILY   . gabapentin (NEURONTIN) 300 MG capsule Take 1 capsule (300 mg total) by mouth 3 (three) times daily.   Marland Kitchen glucose blood test strip 1 each by Other route in the morning and at bedtime. One Touch Verio - Patient checks blood sugar 2-3 times daily.   . insulin aspart (NOVOLOG FLEXPEN) 100 UNIT/ML FlexPen Three times before meals while on prednisone.   . Insulin Pen Needle (BD PEN NEEDLE NANO U/F) 32G X 4 MM MISC Inject 1 each into the skin in the morning and at bedtime.   Marland Kitchen LANTUS SOLOSTAR 100 UNIT/ML Solostar Pen INJECT 68 UNITS SUBCUTANEOUSLY EVERYDAY AT BEDTIME   . levothyroxine (SYNTHROID) 75 MCG tablet TAKE ONE TABLET BY MOUTH ONCE DAILY   . loratadine (CLARITIN) 10 MG tablet Take 10 mg by mouth daily. Taking 3-4 days each  week due to bone pain associated with shot from cancer center.   Marland Kitchen LORazepam (ATIVAN) 0.5 MG tablet TAKE ONE TABLET BY MOUTH TWICE DAILY AS NEEDED FOR ANXIETY   . losartan (COZAAR) 50 MG tablet TAKE ONE TABLET BY MOUTH EVERY EVENING   . morphine (MS CONTIN) 30 MG 12 hr tablet Take 1 tablet (30 mg total) by mouth every 12 (twelve) hours.   . Multiple Vitamin (MULTIVITAMIN WITH MINERALS) TABS tablet Take 1 tablet by mouth daily.  06/08/2018: Pt plans on purchasing again when she can get out.  .  ondansetron (ZOFRAN) 4 MG tablet Take 1 tablet (4 mg total) by mouth every 4 (four) hours as needed for nausea.   Linda [name] 73F MISC 1 each by Does not apply route daily before breakfast. Check blood sugar twice daily.   . pantoprazole (PROTONIX) 40 MG tablet Take 1 tablet (40 mg total) by mouth 2 (two) times daily.   . Pegfilgrastim-jmdb (FULPHILA Comfort) Inject into the skin.   . potassium chloride (MICRO-K) 10 MEQ CR capsule Take by mouth.   . pravastatin (PRAVACHOL) 40 MG tablet Take 1 tablet (40 mg total) by mouth daily.   . prochlorperazine (COMPAZINE) 10 MG tablet Take 1 tablet (10 mg total) by mouth every 6 (six) hours as needed for nausea or vomiting.   . senna (SENOKOT) 8.6 MG tablet Take 1 tablet by mouth daily as needed for constipation.   . SODIUM FLUORIDE 5000 SENSITIVE 1.1-5 % GEL Take by mouth 2 (two) times daily.   Marland Kitchen Specialty Vitamins Products (MAGNESIUM, AMINO ACID CHELATE,) 133 MG tablet Take 1 tablet by mouth at bedtime.   . sucralfate (CARAFATE) 1 g tablet Take 1 tablet (1 g total) by mouth 4 (four) times daily -  with meals and at bedtime.   . Vitamin D, Ergocalciferol, (DRISDOL) 1.25 MG (50000 UNIT) CAPS capsule TAKE ONE CAPSULE BY MOUTH ONCE WEEKLY ON FRIDAY   . zolpidem (AMBIEN) 10 MG tablet TAKE ONE TABLET BY MOUTH EVERYDAY AT BEDTIME AS NEEDED   . [EXPIRED] dexamethasone (DECADRON) injection 8 mg    . [EXPIRED] famotidine (PEPCID) IVPB 20 mg premix    . [EXPIRED] heparin lock flush 100 unit/mL    . [EXPIRED] PACLitaxel (TAXOL) 162 mg in sodium chloride 0.9 % 250 mL chemo infusion (</= 15m/m2)     No facility-administered encounter medications on file as of 02/26/2020.   Allergies  Allergen Reactions  . Codeine Shortness Of Breath  . Celecoxib Other (See Comments)    Unknown reaction  . Ezetimibe-Simvastatin Other (See Comments)    Unknown reaction  . Propranolol Hcl Other (See Comments)    Unknown reaction   SDOH Screenings   Alcohol Screen: Not  on file  Depression (PHQ2-9): Medium Risk  . PHQ-2 Score: 21  Financial Resource Strain: Not on file  Food Insecurity: No Food Insecurity  . Worried About RCharity fundraiserin the Last Year: Never true  . Ran Out of Food in the Last Year: Never true  Housing: Low Risk   . Last Housing Risk Score: 0  Physical Activity: Not on file  Social Connections: Not on file  Stress: Not on file  Tobacco Use: Low Risk   . Smoking Tobacco Use: Never Smoker  . Smokeless Tobacco Use: Never Used  Transportation Needs: Not on file   Current Diagnosis/Assessment:  Goals Addressed   None    Hyperlipidemia   Lipid Panel     Component Value  Date/Time   CHOL 181 10/09/2019 0947   TRIG 146 10/09/2019 0947   HDL 43 10/09/2019 0947   LDLCALC 112 (H) 10/09/2019 0947     The 10-year ASCVD risk score Mikey Bussing DC Jr., et al., 2013) is: 5.2%   Values used to calculate the score:     Age: 70 years     Sex: Female     Is Non-Hispanic African American: No     Diabetic: Yes     Tobacco smoker: No     Systolic Blood Pressure: 366 mmHg     Is BP treated: Yes     HDL Cholesterol: 43 mg/dL     Total Cholesterol: 181 mg/dL   Patient has failed these meds in past: n/a Patient is currently controlled on the following medications:  . Pravastatin 20 mg daily qhs  We discussed:  diet and exercise extensively. Patient's appetite fluctuates a lot with chemo right now. Unable to exercise at this time. Consider increasing dose after chemo to achieve goal LDL.   Plan  Continue current medications   Diabetes   Recent Relevant Labs: Lab Results  Component Value Date/Time   HGBA1C 6.9 (H) 10/09/2019 09:47 AM   HGBA1C 6.2 (H) 07/08/2019 12:00 AM   MICROALBUR 30 10/09/2019 11:56 AM   MICROALBUR 80 07/08/2019 09:51 AM    Lab Results  Component Value Date   CREATININE 0.8 02/24/2020   BUN 12 02/24/2020   GFRNONAA >60 11/27/2019   GFRAA 73 10/09/2019   NA 136 (A) 02/24/2020   K 4.1 02/24/2020    CALCIUM 9.3 02/24/2020   CO2 24 (A) 02/24/2020     Checking BG: Daily  Recent FBG Readings: has not been checking.  Recent HS BG readings: 156 Patient has failed these meds in past: metformin Patient is currently controlled on the following medications:   synjardy 12.05-998 mg daily  lantus 68 units qhs  trulicity 2.94 mg weekly  Last diabetic Foot exam: March 2021 Last diabetic Eye exam: 12/17/2018   We discussed: diet and exercise extensively  Steroids around chemo treatment are making her blood sugar very high. She reports highest recent was 299 mg/dL. Some days she has no appetite and has mouth sores from chemo that makes it hard to eat sometimes. Other days steroids are making her hungry at times as well where she will experience "waves of food attacks". She is very concerned about elevated blood sugar readings. She sees Dr. Hinton Rao tomorrow and will let pharmacist know result of visit. Pharmacist consulting with Dr. Tobie Poet on elevated blood sugar readings.   Plan  Consult with Dr. Tobie Poet to address elevated blood sugar readings.   Hypothyroidism   Lab Results  Component Value Date/Time   TSH 1.450 07/08/2019 12:00 AM   TSH 2.070 04/03/2019 11:36 AM    Patient has failed these meds in past: n/a Patient is currently controlled on the following medications:  . Levothyroxine 75 mcg daily   We discussed: Patient takes levothyroxine each morning. She denies missed doses.   Plan  Continue current medications   Hypertension   BP today is:  <140/90  Office blood pressures are  BP Readings from Last 3 Encounters:  02/26/20 (!) 144/63  02/24/20 130/68  02/19/20 138/61    Patient has failed these meds in the past: lisinopril 10 mg daily Patient is currently controlled on the following medications:   losartan 50 mg daily Patient checks BP at home daily  Patient home BP readings are ranging: 120/60-70  mmHg  We discussed diet and exercise extensively. Checking  blood pressure at home now. States well controlled currently.   Plan  Continue current medications     and  Other Diagnosis: Chronic Pain Syndrome   Patient has failed these meds in past: naproxen,  Patient is currently controlled on the following medications:   morphine er 30 mg bid  gabapentin 300 mg tid  Methocarbamol 500 mg bid  We discussed: Patient has had bad symptoms with restless leg and now increased her gabapentin for neuropathy to three times daily. Reports sore legs since beginning Chemo. Patient will discuss with Dr. Hinton Rao tomorrow.  Plan  Continue current medications  Depression   Patient has failed these meds in past: n/a Patient is currently controlled on the following medications:  . Bupropion xl 300 mg daily . Fetzima 80 mg daily  . Lorazepam 0.5 mg bid prn anxiety   We discussed: Patient reports increased anxiety lately. She is concerned with how she will clean her home or take care of normal duties. She reports a custodial care benefit under her insurance but denied agency in her area. Reports depression is under control overall but anxiety has increased lately. Patient reports using lorazepam prn since surgery/chemo. She would like something less sedating during the day. Discussed buspirone as an option for anxiety. Patient will discuss with Dr. Hinton Rao tomorrow and call pharmacist back with any questions or concerns.    Plan  Continue current medications  GERD/Gastritis   Patient has failed these meds in past: n/a Patient is currently controlled on the following medications:  . Dicyclomine 20 mg qid prn for spasms . Famotidine 20 mg bid . Zofran 4 mg qid prn - nausea . Pantoprazole 40 mg bid  . Sucralfate 1 gm qid . Prochlorperazine 10 mg q6h prn nausea/vomiting . Miralax prn constipation  We discussed: Patient sees Dr. Melina Copa for stomach spasms/issues. Patient struggling with nausea with treatment. Oncology has added prochlorperazine bid to  help with nausea. Patient is experiencing more constipation since surgery/chemo. States current regimen is helping manage.   Plan  Continue current medications  Health Maintenance   Patient is currently controlled on the following medications:  Marland Kitchen Zolpidem 10 mg daily - insomnia . Magnesium daily at bedtime- insomnia . Multivitamin daily - general health . Potassium chloride 10 meq bid - Low potassium . Vitamin D 50,000 IU weekly - Low vitamin D . Ferrous sulfate 325 mg daily  . Clotrimazole-betamethasone bid - rash under breast   We discussed:  Recommend patient begin eating with ferrous sulfate.   Plan  Continue current medications  Vaccines   Reviewed and discussed patient's vaccination history. Has had both [place]. Patient checking with Dr. Hinton Rao to see recommendation on third shot during chemo.   Immunization History  Administered Date(s) Administered  . Hepatitis B 07/10/2019  . Influenza Inj Mdck Quad Pf 10/09/2019  . Influenza-Unspecified 09/14/2018  . PFIZER(Purple Top)SARS-COV-2 Vaccination 04/18/2019, 05/13/2019, 01/24/2020  . Pneumococcal Polysaccharide-23 04/27/2012    Plan  Recommended patient receive annual flu vaccine in office.   Medication Management   Pt uses [facility] for all medications Uses pill box? Yes Pt endorses decent compliance  We discussed: Verbal consent obtained for [facility] enhanced pharmacy services (medication synchronization, adherence packaging, delivery coordination). A medication sync plan was created to allow patient to get all medications delivered once every 30 to 90 days per patient preference. Patient understands they have freedom to choose pharmacy and clinical pharmacist  will coordinate care between all prescribers and [facility].   Plan  Utilize [facility] for medication synchronization, packaging and delivery    Follow up: 3 month phone visit

## 2020-02-26 NOTE — Progress Notes (Signed)
1205: PT STABLE AT TIME OF DISCHARGE

## 2020-02-26 NOTE — Patient Instructions (Signed)
[facility] Discharge Instructions for Patients Receiving Chemotherapy  Today you received the following chemotherapy agents Paclitaxel  To help prevent nausea and vomiting after your treatment, we encourage you to take your nausea medication as directed.   If you develop nausea and vomiting that is not controlled by your nausea medication, call the clinic.   BELOW ARE SYMPTOMS THAT SHOULD BE REPORTED IMMEDIATELY:  *FEVER GREATER THAN 100.5 F  *CHILLS WITH OR WITHOUT FEVER  NAUSEA AND VOMITING THAT IS NOT CONTROLLED WITH YOUR NAUSEA MEDICATION  *UNUSUAL SHORTNESS OF BREATH  *UNUSUAL BRUISING OR BLEEDING  TENDERNESS IN MOUTH AND THROAT WITH OR WITHOUT PRESENCE OF ULCERS  *URINARY PROBLEMS  *BOWEL PROBLEMS  UNUSUAL RASH Items with * indicate a potential emergency and should be followed up as soon as possible.  Feel free to call the clinic should you have any questions or concerns at The clinic phone number is [phone].  Please show the [facility] at check-in to the Emergency Department and triage nurse.

## 2020-02-27 ENCOUNTER — Other Ambulatory Visit: Payer: Self-pay | Admitting: Oncology

## 2020-02-28 ENCOUNTER — Other Ambulatory Visit: Payer: Self-pay | Admitting: Family Medicine

## 2020-02-28 DIAGNOSIS — Z1509 Genetic susceptibility to other malignant neoplasm: Secondary | ICD-10-CM

## 2020-02-28 DIAGNOSIS — Z1501 Genetic susceptibility to malignant neoplasm of breast: Secondary | ICD-10-CM

## 2020-03-01 ENCOUNTER — Other Ambulatory Visit: Payer: Self-pay | Admitting: Oncology

## 2020-03-02 ENCOUNTER — Other Ambulatory Visit: Payer: Self-pay

## 2020-03-02 ENCOUNTER — Inpatient Hospital Stay: Payer: PPO

## 2020-03-02 ENCOUNTER — Other Ambulatory Visit: Payer: Self-pay | Admitting: Hematology and Oncology

## 2020-03-02 DIAGNOSIS — Z171 Estrogen receptor negative status [ER-]: Secondary | ICD-10-CM | POA: Diagnosis not present

## 2020-03-02 DIAGNOSIS — D649 Anemia, unspecified: Secondary | ICD-10-CM | POA: Diagnosis not present

## 2020-03-02 DIAGNOSIS — C50311 Malignant neoplasm of lower-inner quadrant of right female breast: Secondary | ICD-10-CM | POA: Diagnosis not present

## 2020-03-02 LAB — HEPATIC FUNCTION PANEL
ALT: 22 (ref 7–35)
AST: 35 (ref 13–35)
Alkaline Phosphatase: 77 (ref 25–125)
Bilirubin, Total: 0.7

## 2020-03-02 LAB — CBC AND DIFFERENTIAL
HCT: 32 — AB (ref 36–46)
Hemoglobin: 10.5 — AB (ref 12.0–16.0)
Neutrophils Absolute: 1.89
Platelets: 70 — AB (ref 150–399)
WBC: 3

## 2020-03-02 LAB — BASIC METABOLIC PANEL
BUN: 10 (ref 4–21)
CO2: 26 — AB (ref 13–22)
Chloride: 104 (ref 99–108)
Creatinine: 0.7 (ref 0.5–1.1)
Glucose: 169
Potassium: 3.3 — AB (ref 3.4–5.3)
Sodium: 138 (ref 137–147)

## 2020-03-02 LAB — COMPREHENSIVE METABOLIC PANEL
Albumin: 3.9 (ref 3.5–5.0)
Calcium: 8.8 (ref 8.7–10.7)

## 2020-03-02 LAB — CBC
MCV: 96 (ref 81–99)
RBC: 3.32 — AB (ref 3.87–5.11)

## 2020-03-04 ENCOUNTER — Other Ambulatory Visit: Payer: Self-pay

## 2020-03-04 ENCOUNTER — Inpatient Hospital Stay: Payer: PPO

## 2020-03-04 VITALS — BP 132/70 | HR 113 | Temp 98.7°F | Resp 24 | Ht 62.0 in | Wt 204.0 lb

## 2020-03-04 DIAGNOSIS — C50311 Malignant neoplasm of lower-inner quadrant of right female breast: Secondary | ICD-10-CM

## 2020-03-04 DIAGNOSIS — Z171 Estrogen receptor negative status [ER-]: Secondary | ICD-10-CM

## 2020-03-04 DIAGNOSIS — Z5111 Encounter for antineoplastic chemotherapy: Secondary | ICD-10-CM | POA: Diagnosis not present

## 2020-03-04 MED ORDER — SODIUM CHLORIDE 0.9 % IV SOLN
Freq: Once | INTRAVENOUS | Status: AC
  Filled 2020-03-04: qty 250

## 2020-03-04 MED ORDER — FAMOTIDINE IN NACL 20-0.9 MG/50ML-% IV SOLN
20.0000 mg | Freq: Once | INTRAVENOUS | Status: AC
  Administered 2020-03-04: 20 mg via INTRAVENOUS

## 2020-03-04 MED ORDER — DIPHENHYDRAMINE HCL 50 MG/ML IJ SOLN
50.0000 mg | Freq: Once | INTRAMUSCULAR | Status: AC
  Administered 2020-03-04: 50 mg via INTRAVENOUS

## 2020-03-04 MED ORDER — ONDANSETRON HCL 4 MG/2ML IJ SOLN
8.0000 mg | Freq: Once | INTRAMUSCULAR | Status: AC
  Administered 2020-03-04: 8 mg via INTRAVENOUS

## 2020-03-04 MED ORDER — SODIUM CHLORIDE 0.9 % IV SOLN
80.0000 mg/m2 | Freq: Once | INTRAVENOUS | Status: AC
  Administered 2020-03-04: 162 mg via INTRAVENOUS
  Filled 2020-03-04: qty 27

## 2020-03-04 MED ORDER — DEXAMETHASONE SODIUM PHOSPHATE 10 MG/ML IJ SOLN
INTRAMUSCULAR | Status: AC
  Filled 2020-03-04: qty 1

## 2020-03-04 MED ORDER — DEXAMETHASONE SODIUM PHOSPHATE 10 MG/ML IJ SOLN
8.0000 mg | Freq: Once | INTRAMUSCULAR | Status: AC
  Administered 2020-03-04: 8 mg via INTRAVENOUS

## 2020-03-04 MED ORDER — HEPARIN SOD (PORK) LOCK FLUSH 100 UNIT/ML IV SOLN
500.0000 [IU] | Freq: Once | INTRAVENOUS | Status: AC | PRN
  Administered 2020-03-04: 500 [IU]
  Filled 2020-03-04: qty 5

## 2020-03-04 MED ORDER — DIPHENHYDRAMINE HCL 50 MG/ML IJ SOLN
INTRAMUSCULAR | Status: AC
  Filled 2020-03-04: qty 1

## 2020-03-04 MED ORDER — ONDANSETRON HCL 4 MG/2ML IJ SOLN
INTRAMUSCULAR | Status: AC
  Filled 2020-03-04: qty 4

## 2020-03-04 MED ORDER — FAMOTIDINE IN NACL 20-0.9 MG/50ML-% IV SOLN
INTRAVENOUS | Status: AC
  Filled 2020-03-04: qty 50

## 2020-03-04 NOTE — Patient Instructions (Signed)
[facility] Discharge Instructions for Patients Receiving Chemotherapy  Today you received the following chemotherapy agents Paclitaxel.  To help prevent nausea and vomiting after your treatment, we encourage you to take your nausea medication.  Paclitaxel injection What is this medicine? PACLITAXEL (PAK li TAX el) is a chemotherapy drug. It targets fast dividing cells, like cancer cells, and causes these cells to die. This medicine is used to treat ovarian cancer, breast cancer, lung cancer, Kaposi's sarcoma, and other cancers. This medicine may be used for other purposes; ask your health care provider or pharmacist if you have questions. COMMON BRAND NAME(S): Onxol, Taxol What should I tell my health care provider before I take this medicine? They need to know if you have any of these conditions:  history of irregular heartbeat  liver disease  low blood counts, like low white cell, platelet, or red cell counts  lung or breathing disease, like asthma  tingling of the fingers or toes, or other nerve disorder  an unusual or allergic reaction to paclitaxel, alcohol, polyoxyethylated castor oil, other chemotherapy, other medicines, foods, dyes, or preservatives  pregnant or trying to get pregnant  breast-feeding How should I use this medicine? This drug is given as an infusion into a vein. It is administered in a hospital or clinic by a specially trained health care professional. Talk to your pediatrician regarding the use of this medicine in children. Special care may be needed. Overdosage: If you think you have taken too much of this medicine contact a poison control center or emergency room at once. NOTE: This medicine is only for you. Do not share this medicine with others. What if I miss a dose? It is important not to miss your dose. Call your doctor or health care professional if you are unable to keep an appointment. What may interact with this  medicine? Do not take this medicine with any of the following medications:  live virus vaccines This medicine may also interact with the following medications:  antiviral medicines for hepatitis, HIV or AIDS  certain antibiotics like erythromycin and clarithromycin  certain medicines for fungal infections like ketoconazole and itraconazole  certain medicines for seizures like carbamazepine, phenobarbital, phenytoin  gemfibrozil  nefazodone  rifampin  St. John's wort This list may not describe all possible interactions. Give your health care provider a list of all the medicines, herbs, non-prescription drugs, or dietary supplements you use. Also tell them if you smoke, drink alcohol, or use illegal drugs. Some items may interact with your medicine. What should I watch for while using this medicine? Your condition will be monitored carefully while you are receiving this medicine. You will need important blood work done while you are taking this medicine. This medicine can cause serious allergic reactions. To reduce your risk you will need to take other medicine(s) before treatment with this medicine. If you experience allergic reactions like skin rash, itching or hives, swelling of the face, lips, or tongue, tell your doctor or health care professional right away. In some cases, you may be given additional medicines to help with side effects. Follow all directions for their use. This drug may make you feel generally unwell. This is not uncommon, as chemotherapy can affect healthy cells as well as cancer cells. Report any side effects. Continue your course of treatment even though you feel ill unless your doctor tells you to stop. Call your doctor or health care professional for advice if you get a fever, chills or  sore throat, or other symptoms of a cold or flu. Do not treat yourself. This drug decreases your body's ability to fight infections. Try to avoid being around people who are  sick. This medicine may increase your risk to bruise or bleed. Call your doctor or health care professional if you notice any unusual bleeding. Be careful brushing and flossing your teeth or using a toothpick because you may get an infection or bleed more easily. If you have any dental work done, tell your dentist you are receiving this medicine. Avoid taking products that contain aspirin, acetaminophen, ibuprofen, naproxen, or ketoprofen unless instructed by your doctor. These medicines may hide a fever. Do not become pregnant while taking this medicine. Women should inform their doctor if they wish to become pregnant or think they might be pregnant. There is a potential for serious side effects to an unborn child. Talk to your health care professional or pharmacist for more information. Do not breast-feed an infant while taking this medicine. Men are advised not to father a child while receiving this medicine. This product may contain alcohol. Ask your pharmacist or healthcare provider if this medicine contains alcohol. Be sure to tell all healthcare providers you are taking this medicine. Certain medicines, like metronidazole and disulfiram, can cause an unpleasant reaction when taken with alcohol. The reaction includes flushing, headache, nausea, vomiting, sweating, and increased thirst. The reaction can last from 30 minutes to several hours. What side effects may I notice from receiving this medicine? Side effects that you should report to your doctor or health care professional as soon as possible:  allergic reactions like skin rash, itching or hives, swelling of the face, lips, or tongue  breathing problems  changes in vision  fast, irregular heartbeat  high or low blood pressure  mouth sores  pain, tingling, numbness in the hands or feet  signs of decreased platelets or bleeding - bruising, pinpoint red spots on the skin, black, tarry stools, blood in the urine  signs of decreased  red blood cells - unusually weak or tired, feeling faint or lightheaded, falls  signs of infection - fever or chills, cough, sore throat, pain or difficulty passing urine  signs and symptoms of liver injury like dark yellow or brown urine; general ill feeling or flu-like symptoms; light-colored stools; loss of appetite; nausea; right upper belly pain; unusually weak or tired; yellowing of the eyes or skin  swelling of the ankles, feet, hands  unusually slow heartbeat Side effects that usually do not require medical attention (report to your doctor or health care professional if they continue or are bothersome):  diarrhea  hair loss  loss of appetite  muscle or joint pain  nausea, vomiting  pain, redness, or irritation at site where injected  tiredness This list may not describe all possible side effects. Call your doctor for medical advice about side effects. You may report side effects to FDA at 1-800-FDA-1088. Where should I keep my medicine? This drug is given in a hospital or clinic and will not be stored at home. NOTE: This sheet is a summary. It may not cover all possible information. If you have questions about this medicine, talk to your doctor, pharmacist, or health care provider.  2021 Elsevier/Gold Standard (2018-12-12 13:37:23)    If you develop nausea and vomiting that is not controlled by your nausea medication, call the clinic.   BELOW ARE SYMPTOMS THAT SHOULD BE REPORTED IMMEDIATELY:  *FEVER GREATER THAN 100.5 F  *CHILLS WITH OR  WITHOUT FEVER  NAUSEA AND VOMITING THAT IS NOT CONTROLLED WITH YOUR NAUSEA MEDICATION  *UNUSUAL SHORTNESS OF BREATH  *UNUSUAL BRUISING OR BLEEDING  TENDERNESS IN MOUTH AND THROAT WITH OR WITHOUT PRESENCE OF ULCERS  *URINARY PROBLEMS  *BOWEL PROBLEMS  UNUSUAL RASH Items with * indicate a potential emergency and should be followed up as soon as possible.  Feel free to call the clinic should you have any questions or  concerns at The clinic phone number is [phone].  Please show the [facility] at check-in to the Emergency Department and triage nurse.

## 2020-03-05 ENCOUNTER — Other Ambulatory Visit: Payer: Self-pay

## 2020-03-05 ENCOUNTER — Ambulatory Visit (INDEPENDENT_AMBULATORY_CARE_PROVIDER_SITE_OTHER): Payer: PPO

## 2020-03-05 DIAGNOSIS — E1121 Type 2 diabetes mellitus with diabetic nephropathy: Secondary | ICD-10-CM

## 2020-03-05 DIAGNOSIS — I10 Essential (primary) hypertension: Secondary | ICD-10-CM | POA: Diagnosis not present

## 2020-03-05 DIAGNOSIS — E782 Mixed hyperlipidemia: Secondary | ICD-10-CM

## 2020-03-05 MED ORDER — FREESTYLE LIBRE 2 READER DEVI: 0 refills | Status: DC

## 2020-03-05 MED ORDER — FREESTYLE LIBRE 2 SENSOR MISC: 3 refills | Status: DC

## 2020-03-05 NOTE — Progress Notes (Incomplete)
[facility]  [address] [place],  [place]  [address] [phone]  Clinic Day:  03/05/2020  Referring physician: [name], MD   This document serves as Linda [name] record of services personally performed by [name], MD. It was created on their behalf by Curry,Lauren E, Linda [name] trained medical scribe. The creation of this record is based on the scribe's personal observations and the provider's statements to them.  CHIEF COMPLAINT:  CC:  Stage IB triple negative breast cancer  Current Treatment:  AC for 4 doses to be followed by weekly Taxol for 12 doses   HISTORY OF PRESENT ILLNESS:  Linda [name] is Linda [name] 50 y.o. female who we began seeing in February 2018 for evaluation of anemia.  Her anemia was felt to be secondary to iron deficiency and we recommended continuation of oral iron supplementation for Linda [name] total of 6 months, as well as referral back to the gastroenterologist.  The patient also has Linda [name] history of thrombocytopenia and had previously seen Dr. Bobby Rumpf in 2014.  The thrombocytopenia was felt to be secondary to mild chronic immune thrombocytopenic purpura (ITP).  Due to the patient's family history of malignancy, she underwent testing for hereditary cancer syndromes with the [facility] myRisk Hereditary Cancer Panel test.  This revealed Linda [name] mutation in the BRCA 1 gene, which is associated with Linda [name] significantly increased risk for breast and ovarian cancer, with elevated risk of pancreatic cancer.  She underwent Linda [name] robotic hysterectomy and bilateral salpingo-oophorectomy in May 2018 with Dr. Everitt Amber.  Pathology was benign.  We also discussed the option of risk reducing bilateral mastectomy, but she has chosen to have close surveillance, so we recommended annual MRI breast, in addition to mammography.  She was placed on chemoprevention with raloxifene in September 2018.  CT abdomen and pelvis in August 2020 done for bilateral flank pain, revealed Linda [name] tiny left renal  calculus, as well as probable hepatic cirrhosis without evidence of hepatic mass.  She was then referred to Linda [name] and is followed by Linda [name], CRNP in [place] for her liver cirrhosis.  She tested negative for hepatitis Linda [name], B, and C.  She has received her hepatitis vaccines in 2020.  We saw her in September 2021 for Linda [name] new diagnosis of stage IB (T1c N0 M0) triple negative right breast cancer.  She underwent screening bilateral mammogram on August 3rd which revealed possible masses in the right breast.  Diagnostic right mammogram and right breast ultrasound from August 18th confirmed suspicious masses in the right breast at 5 o'clock measuring 1.5 cm and 6 o'clock measuring 1.4 cm.  There was an indeterminate 4 mm with possible distortion in the outer right breast without sonographic correlate.  Right axillary ultrasound was normal.  She then underwent biopsies of both masses and surgical pathology from these procedures revealed  invasive ductal carcinoma, grade 3, with necrosis at 5 o'clock; and invasive ductal carcinoma, grade 1-2, with necrosis and focal myxoid change at 6 o'clock.  No DCIS was identified.  Breast prognostic profiles revealed HER2, and estrogen and progesterone receptors to be negative. Ki67 was 30% at the 5 o'clock mass, and 40% at 6 o'clock.  She underwent bilateral mastectomies on September 24th with Dr. Noberto Retort. Surgical pathology from this procedure revealed invasive ductal carcinoma, grade 3, with myxoid change, 19 mm, at 6 o'clock, and invasive ductal carcinoma, grade 3, and ductal carcinoma in situ, 19 mm, at 5 o'clock.  All margins were negative for  invasive carcinoma or DCIS.  Two sentinel lymph nodes were negative for metastatic carcinoma (0/2). CT chest, abdomen and pelvis in September did not reveal any evidence of metastatic disease.  She started dose dense AC chemotherapy on October 25th , with plans to follow this with weekly paclitaxel for 12  doses.  She had severe restless leg syndrome with oral corticosteroids.  At her visit on November 29th prior to Linda [name] 3rd cycle of AC chemotherapy she had significantly worsened anemia with Linda [name] hemoglobin of 8.7, so we decided to wait 3 weeks for her 4th cycle of chemotherapy.  Due to cytopenias, this had to be delayed at least 2 more weeks, but she finally got her final dose on January 4th.  Iron studies, B12 and folate were normal.  She started weekly paclitaxel on January 26th.   She is taking dexamethasone 86m premedication for paclitaxel 2 tablets the night before and the morning of chemotherapy. She has pre-existing neuropathy of the feet and legs.  INTERVAL HISTORY:  ALilliemaeis here prior to cycle 2 of weekly paclitaxel. She states she tolerated her 1st cycle without difficulty.  She denies fevers or chills. She denies pain.  Her  appetite is good. Her weight has been stable.  She states her blood sugar has been under better control now, but was still over 200 this morning. She says when she tries to wear her regular bra with knitted prostheses, she has pain of her left chest wall.   I gave her information about [facility] as mastectomy bras and prosthesis will be more comfortable and should be covered by her insurance.  ASabinais here for routine follow up prior to Linda [name] 4th cycle of weekly paclitaxel.   Her  appetite is good, and she has gained/lost _ pounds since her last visit.  She denies fever, chills or other signs of infection.  She denies nausea, vomiting, bowel issues, or abdominal pain.  She denies sore throat, cough, dyspnea, or chest pain.  REVIEW OF SYSTEMS:  Review of Systems - Oncology   VITALS:  Last menstrual period 06/13/2009.  Wt Readings from Last 3 Encounters:  03/04/20 204 lb 0.1 oz (92.5 kg)  02/26/20 207 lb 4 oz (94 kg)  02/24/20 200 lb (90.7 kg)    There is no height or weight on file to calculate BMI.  Performance status (ECOG): 0 - Asymptomatic  PHYSICAL EXAM:   Physical Exam  LABS:   CBC Latest Ref Rng & Units 03/02/2020 02/24/2020 02/17/2020  WBC - 3.0 5.5 5.8  Hemoglobin 12.0 - 16.0 10.5(Linda [name]) 10.1(Linda [name]) 9.6(Linda [name])  Hematocrit 36 - 46 32(Linda [name]) 30(Linda [name]) 29(Linda [name])  Platelets 150 - 399 70(Linda [name]) 130(Linda [name]) 113(Linda [name])   CMP Latest Ref Rng & Units 03/02/2020 02/24/2020 02/17/2020  Glucose 70 - 99 mg/dL - - -  BUN 4 - 21 10 12 10   Creatinine 0.5 - 1.1 0.7 0.8 0.8  Sodium 137 - 147 138 136(Linda [name]) 133(Linda [name])  Potassium 3.4 - 5.3 3.3(Linda [name]) 4.1 4.0  Chloride 99 - 108 104 102 97(Linda [name])  CO2 13 - 22 26(Linda [name]) 24(Linda [name]) 28(Linda [name])  Calcium 8.7 - 10.7 8.8 9.3 9.7  Total Protein 6.5 - 8.1 g/dL - - -  Total Bilirubin 0.3 - 1.2 mg/dL - - -  Alkaline Phos 25 - 125 77 86 115  AST 13 - 35 35 34 31  ALT 7 - 35 22 24 22     STUDIES:  No results found.   Allergies:  Allergies  Allergen Reactions  . Codeine  Shortness Of Breath  . Celecoxib Other (See Comments)    Unknown reaction  . Ezetimibe-Simvastatin Other (See Comments)    Unknown reaction  . Propranolol Hcl Other (See Comments)    Unknown reaction    Current Medications: Current Outpatient Medications  Medication Sig Dispense Refill  . benzonatate (TESSALON) 100 MG capsule Take 1 capsule (100 mg total) by mouth 3 (three) times daily as needed for cough. 30 capsule 0  . Blood Glucose Monitoring Suppl (ONETOUCH VERIO REFLECT) w/Device KIT AS DIRECTED    . buPROPion (WELLBUTRIN XL) 300 MG 24 hr tablet TAKE ONE TABLET BY MOUTH EVERY EVENING 90 tablet 1  . calcium citrate-vitamin D (CITRACAL+D) 315-200 MG-UNIT tablet Take 1 tablet by mouth 2 (two) times daily.    . clotrimazole-betamethasone (LOTRISONE) cream Apply small amount to affected area twice daily (Patient taking differently: as needed. Apply small amount to affected area twice daily) 45 g 0  . Continuous Blood Gluc Receiver ([place]) DEVI 1 each by Does not apply route in the morning, at noon, and at bedtime. 1 each 3  . Continuous Blood Gluc Sensor (DEXCOM G6 SENSOR) MISC Check sugars qac  and qhs. 9 each 3  . Continuous Blood Gluc Transmit (DEXCOM G6 TRANSMITTER) MISC 1 each by Does not apply route every 3 (three) months. 1 each 3  . cyclobenzaprine (FLEXERIL) 10 MG tablet TAKE ONE TABLET BY MOUTH EVERY 8 HOURS AS NEEDED FOR MUSCLE SPASMS 30 tablet 3  . dexamethasone (DECADRON) 4 MG tablet Take 4 mg by mouth 2 (two) times daily with Linda [name] meal.    . dicyclomine (BENTYL) 20 MG tablet TAKE ONE TABLET BY MOUTH BEFORE MEALS AND AT BEDTIME AS NEEDED FOR STOMACH CRAMPING 180 tablet 1  . Dulaglutide (TRULICITY) 5.73 UK/0.2RK SOPN INJECT ONE SYRINGE ONCE Linda [name] WEEK 6 mL 1  . Empagliflozin-Linaglip-Metform 5-2.05-998 MG TB24 Take 1,000 mg by mouth daily.    . Empagliflozin-metFORMIN HCl (SYNJARDY) 12.05-998 MG TABS Take 1 tablet by mouth daily. 90 tablet 0  . famotidine (PEPCID) 20 MG tablet TAKE ONE TABLET BY MOUTH TWICE DAILY 180 tablet 1  . ferrous sulfate 325 (65 FE) MG tablet Take 325 mg by mouth every evening.    Marland Kitchen FETZIMA 80 MG CP24 TAKE ONE CAPSULE BY MOUTH ONCE DAILY 90 capsule 1  . gabapentin (NEURONTIN) 300 MG capsule Take 1 capsule (300 mg total) by mouth 3 (three) times daily. 270 capsule 0  . glucose blood test strip 1 each by Other route in the morning and at bedtime. One Touch Verio - Patient checks blood sugar 2-3 times daily. 100 each 2  . insulin aspart (NOVOLOG FLEXPEN) 100 UNIT/ML FlexPen Three times before meals while on prednisone. 15 mL 11  . Insulin Pen Needle (BD PEN NEEDLE NANO U/F) 32G X 4 MM MISC Inject 1 each into the skin in the morning and at bedtime. 200 each 3  . LANTUS SOLOSTAR 100 UNIT/ML Solostar Pen INJECT 68 UNITS SUBCUTANEOUSLY EVERYDAY AT BEDTIME 15 mL 2  . levothyroxine (SYNTHROID) 75 MCG tablet TAKE ONE TABLET BY MOUTH ONCE DAILY 90 tablet 1  . loratadine (CLARITIN) 10 MG tablet Take 10 mg by mouth daily. Taking 3-4 days each week due to bone pain associated with shot from cancer center.    Marland Kitchen LORazepam (ATIVAN) 0.5 MG tablet TAKE ONE TABLET BY MOUTH TWICE  DAILY AS NEEDED FOR ANXIETY 30 tablet 2  . losartan (COZAAR) 50 MG tablet TAKE ONE TABLET BY MOUTH EVERY  EVENING 90 tablet 1  . morphine (MS CONTIN) 30 MG 12 hr tablet Take 1 tablet (30 mg total) by mouth every 12 (twelve) hours. 180 tablet 0  . Multiple Vitamin (MULTIVITAMIN WITH MINERALS) TABS tablet Take 1 tablet by mouth daily.     . ondansetron (ZOFRAN) 4 MG tablet Take 1 tablet (4 mg total) by mouth every 4 (four) hours as needed for nausea. 90 tablet 3  . OneTouch Delica Lancets 42A MISC 1 each by Does not apply route daily before breakfast. Check blood sugar twice daily. 100 each 3  . pantoprazole (PROTONIX) 40 MG tablet TAKE ONE TABLET BY MOUTH TWICE DAILY 180 tablet 1  . Pegfilgrastim-jmdb (FULPHILA [place]) Inject into the skin.    . potassium chloride (MICRO-K) 10 MEQ CR capsule Take by mouth.    . pravastatin (PRAVACHOL) 40 MG tablet Take 1 tablet (40 mg total) by mouth daily. 90 tablet 0  . prochlorperazine (COMPAZINE) 10 MG tablet Take 1 tablet (10 mg total) by mouth every 6 (six) hours as needed for nausea or vomiting. 90 tablet 3  . senna (SENOKOT) 8.6 MG tablet Take 1 tablet by mouth daily as needed for constipation.    . SODIUM FLUORIDE 5000 SENSITIVE 1.1-5 % GEL Take by mouth 2 (two) times daily.    Marland Kitchen Specialty Vitamins Products (MAGNESIUM, AMINO ACID CHELATE,) 133 MG tablet Take 1 tablet by mouth at bedtime.    . sucralfate (CARAFATE) 1 g tablet Take 1 tablet (1 g total) by mouth 4 (four) times daily -  with meals and at bedtime. 270 tablet 1  . Vitamin D, Ergocalciferol, (DRISDOL) 1.25 MG (50000 UNIT) CAPS capsule TAKE ONE CAPSULE BY MOUTH ONCE WEEKLY ON FRIDAY 12 capsule 1  . zolpidem (AMBIEN) 10 MG tablet TAKE ONE TABLET BY MOUTH EVERYDAY AT BEDTIME AS NEEDED 90 tablet 0   No current facility-administered medications for this visit.     ASSESSMENT & PLAN:   Assessment:   1. Positive BRCA 1 mutation, which increases her risk for breast and ovarian cancer, as well as  elevates her risk for pancreatic cancer, for which she underwent hysterectomy/bilateral salpingo-oophorectomy and is on chemoprevention with raloxifene and regular surveillance.   She initially declined bilateral prophylactic mastectomies.   2.  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  She has now undergone bilateral mastectomies.  She completed 4 cycles of Adriamycin/cyclophosphamide chemotherapy and is now receiving weekly paclitaxel and tolerated her 1st dose without difficulty.  3.  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  We feel this is Linda [name] separate primary.    4. Thrombocytopenia, which is felt to be due to chronic ITP. This worsened with chemotherapy, but her platelets remain over 100,000 now.    5. Liver cirrhosis as seen on CT imaging in August 2020.  She has been referred and is being seen by Linda [name], CRNP, at the [facility] in [place].  Hepatitis panel was negative and she has received the appropriate vaccines.  Abdominal ultrasound from April 2021 was stable and CT imaging from September 2021 was stable.    6. Anemia secondary to chemotherapy, which has improved.  7.  Diabetes, which is under better control this week.  She is on steroid premedication for  paclitaxel.  As she tolerated her 1st cycle without significant difficulty, we will decrease the dexamethasone 4 mg to 1 tablet the night before and the morning of chemotherapy.  If she does well again this week, we willI probably eliminate the steroid premedication altogether.   Plan:   She will proceed with Linda [name] 4th cycle of weekly paclitaxel on February 16th and we will continue weekly labs and paclitaxel.   We will plan to see her back in 2 weeks with Linda [name] CBC and  comprehensive metabolic panel for re-examination.  The patient understands the plans discussed today and is in agreement with them.  She knows to contact our office if she develops concerns prior to her next appointment.    Derwood Kaplan, MD [facility] [address] [place] [address] Dept: [phone] Dept Fax: [phone]   I, Rita Ohara, am acting as scribe for Derwood Kaplan, MD  I have reviewed this report as typed by the medical scribe, and it is complete and accurate.

## 2020-03-05 NOTE — Progress Notes (Signed)
Chronic Care Management Pharmacy Note  03/11/2020 Name:  Linda [name] MRN:  [id number] DOB:  14-Apr-1970   Plan recommendatons:   Consider ordering Freestyle Libre for patient to monitor blood sugar better.  Recommend consider increasing Synjardy for better blood sugar control.   Subjective: Linda [name] is an 50 y.o. year old female who is a primary patient of [name], Kirsten, MD.  The CCM team was consulted for assistance with disease management and care coordination needs.    Engaged with patient by telephone for follow up visit in response to provider referral for pharmacy case management and/or care coordination services.   Consent to Services:  The patient was given information about Chronic Care Management services, agreed to services, and gave verbal consent prior to initiation of services.  Please see initial visit note for detailed documentation.   Patient Care Team: [name], MD as PCP - General (Family Medicine) Burnice Logan, [facility] as Pharmacist (Pharmacist)  Recent office visits: 02/11/2020 - continue synjardy 12.05/998 mg daily. 6 units of Novolog before meals if sugar >150.  10/09/2019 - flu shot given. Consider reduction in diabetes medication in the future if A1C stays excellent.   Recent Consult Visit: 02/24/2020 - oncology visit. Continuing paclitaxel.  02/21/2020 - Podiatry - debridement of second toe ulcer performed.   Hospital visits: None in previous 6 months  Objective:  Lab Results  Component Value Date   CREATININE 0.7 03/09/2020   BUN 13 03/09/2020   GFRNONAA >60 11/27/2019   GFRAA 73 10/09/2019   NA 136 (A) 03/09/2020   K 2.8 (A) 03/09/2020   CALCIUM 8.6 (A) 03/09/2020   CO2 24 (A) 03/09/2020    Lab Results  Component Value Date/Time   HGBA1C 6.9 (H) 10/09/2019 09:47 AM   HGBA1C 6.2 (H) 07/08/2019 12:00 AM   MICROALBUR 30 10/09/2019 11:56 AM   MICROALBUR 80 07/08/2019 09:51 AM    Last diabetic Eye exam: No results  found for: HMDIABEYEEXA  Last diabetic Foot exam: No results found for: HMDIABFOOTEX   Lab Results  Component Value Date   CHOL 181 10/09/2019   HDL 43 10/09/2019   LDLCALC 112 (H) 10/09/2019   TRIG 146 10/09/2019   CHOLHDL 4.2 10/09/2019    Hepatic Function Latest Ref Rng & Units 03/09/2020 03/02/2020 02/24/2020  Total Protein 6.5 - 8.1 g/dL - - -  Albumin 3.5 - 5.0 4.2 3.9 4.1  AST 13 - 35 27 35 34  ALT 7 - 35 22 22 24   Alk Phosphatase 25 - 125 103 77 86  Total Bilirubin 0.3 - 1.2 mg/dL - - -    Lab Results  Component Value Date/Time   TSH 1.450 07/08/2019 12:00 AM   TSH 2.070 04/03/2019 11:36 AM    CBC Latest Ref Rng & Units 03/09/2020 03/02/2020 02/24/2020  WBC - 2.5 3.0 5.5  Hemoglobin 12.0 - 16.0 11.1(A) 10.5(A) 10.1(A)  Hematocrit 36 - 46 33(A) 32(A) 30(A)  Platelets 150 - 399 67(A) 70(A) 130(A)    No results found for: VD25OH  Clinical ASCVD: No  The 10-year ASCVD risk score Mikey Bussing DC Jr., et al., 2013) is: 4.1%   Values used to calculate the score:     Age: 76 years     Sex: Female     Is Non-Hispanic African American: No     Diabetic: Yes     Tobacco smoker: No     Systolic Blood Pressure: 790 mmHg     Is BP treated:  Yes     HDL Cholesterol: 43 mg/dL     Total Cholesterol: 181 mg/dL    Depression screen [facility] 2/9 03/09/2020 10/09/2019 06/26/2019  Decreased Interest 3 3 1   Down, Depressed, Hopeless 3 3 1   PHQ - 2 Score 6 6 2   Altered sleeping 3 3 3   Tired, decreased energy 3 3 2   Change in appetite 3 3 1   Feeling bad or failure about yourself  2 0 0  Trouble concentrating 2 3 3   Moving slowly or fidgety/restless 2 3 0  Suicidal thoughts 0 0 0  PHQ-9 Score 21 21 11   Difficult doing work/chores - Somewhat difficult -     Social History   Tobacco Use  Smoking Status Never Smoker  Smokeless Tobacco Never Used   BP Readings from Last 3 Encounters:  03/11/20 127/62  03/09/20 (!) 132/55  03/09/20 140/67   Pulse Readings from Last 3 Encounters:  03/11/20  (!) 101  03/09/20 (!) 106  03/09/20 (!) 108   Wt Readings from Last 3 Encounters:  03/11/20 198 lb 12 oz (90.2 kg)  03/09/20 196 lb (88.9 kg)  03/09/20 197 lb 9.6 oz (89.6 kg)    Assessment/Interventions: Review of patient past medical history, allergies, medications, health status, including review of consultants reports, laboratory and other test data, was performed as part of comprehensive evaluation and provision of chronic care management services.   SDOH:  (Social Determinants of Health) assessments and interventions performed: Yes SDOH Interventions   Flowsheet Row Most Recent Value  SDOH Interventions   Depression Interventions/Treatment  Medication, Currently on Treatment      CCM Care Plan  Allergies  Allergen Reactions  . Codeine Shortness Of Breath  . Celecoxib Other (See Comments)    Unknown reaction  . Ezetimibe-Simvastatin Other (See Comments)    Unknown reaction  . Propranolol Hcl Other (See Comments)    Unknown reaction    Medications Reviewed Today    Reviewed by Laurena Slimmer, RN (Registered Nurse) on 03/11/20 at 44  Med List Status: <None>  Medication Order Taking? Sig Documenting Provider Last Dose Status Informant  benzonatate (TESSALON) 100 MG capsule [id number]  Take 1 capsule (100 mg total) by mouth 3 (three) times daily as needed for cough. Mosher, Vida Roller A, PA-C  Active   Blood Glucose Monitoring Suppl (ONETOUCH VERIO REFLECT) w/Device Drucie Opitz [id number] No AS DIRECTED [provider] Taking Active   buPROPion (WELLBUTRIN XL) 300 MG 24 hr tablet [id number]  TAKE ONE TABLET BY MOUTH EVERY EVENING [name], Kirsten, MD  Active   calcium citrate-vitamin D (CITRACAL+D) 315-200 MG-UNIT tablet [id number] No Take 1 tablet by mouth 2 (two) times daily. [provider] Taking Active   clotrimazole-betamethasone (LOTRISONE) cream [id number] No Apply small amount to affected area twice daily  Patient taking differently: as needed. Apply small amount  to affected area twice daily   [name], Kirsten, MD Taking Active   Continuous Blood Gluc Receiver (FREESTYLE LIBRE 2 READER) Kerrin Mo [id number]  E11.69 Check blood sugar 4 times daily as directed [name], Kirsten, MD  Active   Continuous Blood Gluc Sensor (FREESTYLE LIBRE 2 SENSOR) [place] [id number]  E11.69 Change sensor every 14 days as directed [name], Elnita Maxwell, MD  Active   Continuous Blood Gluc Transmit (DEXCOM G6 TRANSMITTER) MISC [id number]  1 each by Does not apply route every 3 (three) months. [name], Kirsten, MD  Active   cyclobenzaprine (FLEXERIL) 10 MG tablet [id number]  TAKE ONE TABLET BY MOUTH EVERY 8 HOURS AS NEEDED  FOR MUSCLE SPASMS Derwood Kaplan, MD  Active   dexamethasone (DECADRON) 4 MG tablet [id number]  Take 4 mg by mouth 2 (two) times daily with a meal. [provider]  Active   dicyclomine (BENTYL) 20 MG tablet [id number] No TAKE ONE TABLET BY MOUTH BEFORE MEALS AND AT BEDTIME AS NEEDED FOR STOMACH CRAMPING [name], MD Taking Active   Dulaglutide (TRULICITY) 7.61 PJ/0.9TO Bon[place] Aid [id number] No INJECT ONE SYRINGE ONCE A Norton Pastel, MD Taking Active   Empagliflozin-Linaglip-Metform 5-2.05-998 MG TB24 [id number]  Take 1,000 mg by mouth daily. [provider]  Active   Empagliflozin-metFORMIN HCl (SYNJARDY) 12.05-998 MG TABS [id number]  Take 1 tablet by mouth daily. [name], Kirsten, MD  Active   famotidine (PEPCID) 20 MG tablet [id number]  TAKE ONE TABLET BY MOUTH TWICE DAILY [name], Kirsten, MD  Active   ferrous sulfate 325 (65 FE) MG tablet [id number] No Take 325 mg by mouth every evening. [provider] Taking Active   FETZIMA 80 MG CP24 [id number]  TAKE ONE CAPSULE BY MOUTH ONCE DAILY [name], Kirsten, MD  Active   gabapentin (NEURONTIN) 300 MG capsule [id number]  Take 1 capsule (300 mg total) by mouth 3 (three) times daily. [name], Kirsten, MD  Active   glucose blood test strip [id number]  1 each by Other route in the morning and at bedtime. One Touch Verio - Patient checks blood  sugar 2-3 times daily. CoxElnita Maxwell, MD  Active   heparin lock flush 100 unit/mL [id number]   Derwood Kaplan, MD  Active   insulin aspart (NOVOLOG FLEXPEN) 100 UNIT/ML FlexPen [id number]  Three times before meals while on prednisone. [name], Kirsten, MD  Active   Insulin Pen Needle (BD PEN NEEDLE NANO U/F) 32G X 4 MM MISC [id number] No Inject 1 each into the skin in the morning and at bedtime. [name], MD Taking Active   LANTUS SOLOSTAR 100 UNIT/ML Solostar Pen [id number]  INJECT 68 UNITS SUBCUTANEOUSLY EVERYDAY AT BEDTIME Marge Duncans, PA-C  Active   levothyroxine (SYNTHROID) 75 MCG tablet [id number]  TAKE ONE TABLET BY MOUTH ONCE DAILY [name], Kirsten, MD  Active   loratadine (CLARITIN) 10 MG tablet [id number] No Take 10 mg by mouth daily. Taking 3-4 days each week due to bone pain associated with shot from cancer center. [provider] Taking Active   LORazepam (ATIVAN) 0.5 MG tablet [id number] No TAKE ONE TABLET BY MOUTH TWICE DAILY AS NEEDED FOR ANXIETY [name], Kirsten, MD Taking Active   losartan (COZAAR) 50 MG tablet [id number]  TAKE ONE TABLET BY MOUTH EVERY EVENING [name], Kirsten, MD  Active   morphine (MS CONTIN) 30 MG 12 hr tablet [id number]  Take 1 tablet (30 mg total) by mouth every 12 (twelve) hours. [name], Kirsten, MD  Active   Multiple Vitamin (MULTIVITAMIN WITH MINERALS) TABS tablet [id number] No Take 1 tablet by mouth daily.  [provider] Taking Active Self           Med Note Orvan Seen, Sharlette Dense   Fri Jun 08, 2018  9:21 PM) Pt plans on purchasing again when she can get out.  ondansetron (ZOFRAN) 4 MG tablet [id number]  Take 1 tablet (4 mg total) by mouth every 4 (four) hours as needed for nausea. Melodye Ped, NP  Active   OneTouch Delica Lancets 77L MISC [id number] No 1 each by Does not apply route daily before breakfast. Check blood sugar twice daily. [name], Kirsten, MD Taking Active   PACLitaxel (TAXOL) 162 mg in  sodium chloride 0.9 % 250 mL chemo infusion (</= 62m/m2)  [id number]  MDerwood Kaplan MD  Active   pantoprazole (PROTONIX) 40 MG tablet [id number] TAKE ONE TABLET BY MOUTH TWICE DAILY CRochel Brome MD  Active   Pegfilgrastim-jmdb ([facility] [id number] Inject into the skin. [provider]  Active   potassium chloride (MICRO-K) 10 MEQ CR capsule [id number] Take by mouth. [provider]  Active   pravastatin (PRAVACHOL) 20 MG tablet [id number] Take 20 mg by mouth at bedtime. [provider]  Active   prochlorperazine (COMPAZINE) 10 MG tablet [id number] Take 1 tablet (10 mg total) by mouth every 6 (six) hours as needed for nausea or vomiting. PDayton ScrapeA, NP  Active   senna (SENOKOT) 8.6 MG tablet [id number] Take 1 tablet by mouth daily as needed for constipation. [provider]  Active   SODIUM FLUORIDE 5000 SENSITIVE 1.1-5 % GEL [id number] Take by mouth 2 (two) times daily. [provider]  Active   Specialty Vitamins Products (MAGNESIUM, AMINO ACID CHELATE,) 133 MG tablet [id number] Take 1 tablet by mouth at bedtime. [provider] Taking Active   sucralfate (CARAFATE) 1 g tablet [id number] Take 1 tablet (1 g total) by mouth 4 (four) times daily -  with meals and at bedtime. [name], Kirsten, MD Taking Active   Vitamin D, Ergocalciferol, (DRISDOL) 1.25 MG (50000 UNIT) CAPS capsule [id number] TAKE ONE CAPSULE BY MOUTH ONCE WEEKLY ON FPeggyann Shoals MD  Active   zolpidem (AMBIEN) 10 MG tablet [id number] TAKE ONE TABLET BY MOUTH EVERYDAY AT BEDTIME AS NEEDED [name],Elnita Maxwell MD  Active           Patient Active Problem List   Diagnosis Date Noted  . Hypokalemia 03/09/2020  . Chemotherapy-induced thrombocytopenia 02/06/2020  . Thrombocytopenia, unspecified ([place] 01/27/2020  . Malignant neoplasm of lower-inner quadrant of right breast of female, estrogen receptor negative ([place] 10/01/2019  . Chronic pain syndrome 07/08/2019  . Memory loss 07/08/2019  . Depression, major,  recurrent, mild ([facility] 07/08/2019  . Uncomplicated opioid dependence ([place] 07/08/2019  . Mixed hyperlipidemia 04/11/2019  . Dyslipidemia associated with type 2 diabetes mellitus ([facility] 04/11/2019  . Essential hypertension, benign 04/11/2019  . Major depressive disorder, single episode, mild ([place] 04/11/2019  . Acquired hypothyroidism 04/11/2019  . Vitamin D insufficiency 04/11/2019  . Class 2 severe obesity due to excess calories with serious comorbidity and body mass index (BMI) of 35.0 to 35.9 in adult (HS[place] 04/11/2019  . Pre-ulcerative calluses 03/04/2019  . Liver cirrhosis secondary to NASH (nonalcoholic steatohepatitis) ([facility] 09/13/2018    Class: Chronic  . BRCA1 positive 06/14/2016    Immunization History  Administered Date(s) Administered  . Hepatitis B 07/10/2019  . Hepatitis B, adult 03/11/2020  . Influenza Inj Mdck Quad Pf 10/09/2019  . Influenza-Unspecified 09/14/2018  . PFIZER(Purple Top)SARS-COV-2 Vaccination 04/18/2019, 05/13/2019, 01/24/2020  . Pneumococcal Polysaccharide-23 04/27/2012    Conditions to be addressed/monitored:  Hypertension, Hyperlipidemia, Diabetes, Hypothyroidism and Depression  Care Plan : [place] Updates made by BBurnice Logan RKeneficsince 03/11/2020 12:00 AM    Problem: diabetes, hyperlipidemia, hypertension   Priority: High  Onset Date: 03/05/2020    Long-Range Goal: Disease Management   Start Date: 03/05/2020  Expected End Date: 03/05/2021  This Visit's Progress: On track  Priority: High  Note:     Current Barriers:  . Unable to maintain control of blood sugar while on  steroids/chemotherapy.    Pharmacist Clinical Goal(s):  Marland Kitchen Over the next 90 days, patient will achieve control of diabetes as evidenced by blood sugar and a1c through collaboration with PharmD and provider.   Interventions: . 1:1 collaboration with [name], MD regarding development and update of comprehensive plan of care as evidenced by provider  attestation and co-signature . Inter-disciplinary care team collaboration (see longitudinal plan of care) . Comprehensive medication review performed; medication list updated in electronic medical record  Hypertension (BP goal <130/80) -controlled -Current treatment: . losartan 50 mg daily  -Medications previously tried: none reported  -Current home readings: well controlled when checked at home or office -Current dietary habits: eating what family prepares while on chemotherapy -Current exercise habits: chemotherapy limits exercise and energy -Denies hypotensive/hypertensive symptoms -Educated on BP goals and benefits of medications for prevention of heart attack, stroke and kidney damage; Daily salt intake goal < 2300 mg; -Counseled to monitor BP at home as needed, document, and provide log at future appointments -Counseled on diet and exercise extensively Recommended to continue current medication  Hyperlipidemia: (LDL goal < 70) -uncontrolled  -Current treatment: . Pravastatin 40 mg daily  -Medications previously tried: none reported  -Current dietary patterns: eating what family provides during chemotherapy -Current exercise habits: limited due to chemo -Educated on Cholesterol goals;  Importance of limiting foods high in cholesterol; Exercise goal of 150 minutes per week; -Counseled on diet and exercise extensively  Diabetes (A1c goal <7%) -uncontrolled -Current medications: . Trulicity 9.41 mg weekly  . Synjardy 12.05/998 mg daily  . Lantus 68 units daily  . Novolog 10 units with meals  (patient increased on her own) -Medications previously tried: none reported  -Current home glucose readings . fasting glucose: 200s . post prandial glucose: elevated -Reports hypoglycemic/hyperglycemic symptoms -Current meal patterns:  . Diet: Patient currently eating what family provides. States that she has days where she can't eat enough due to steroids around chemotherapy. She  tries to be careful when she can.  -Current exercise: limited due to chemotherapy -Educated onA1c and blood sugar goals; Exercise goal of 150 minutes per week; Benefits of routine self-monitoring of blood sugar; -Counseled to check feet daily and get yearly eye exams -Counseled on diet and exercise extensively  -Recommend considering increase in [place] - patient agrees and is very stressed about increased blood sugar numbers.   Depression/Anxiety (Goal: manage symptoms) -uncontrolled -Current treatment: . Wellbutrin XL 300 mg daily . Fetzima 80 mg  . Lorazepam 0.5 mg bid prn anxiety -Medications previously tried/failed: none reported -PHQ9: 21 -GAD7: 16 -Educated on Benefits of cognitive-behavioral therapy with or without medication -Recommended to continue current medication Recommended consider speaking with a counselor.    Patient Goals/Self-Care Activities . Over the next 90 days, patient will:  - take medications as prescribed check glucose three times daily , document, and provide at future appointments  Follow Up Plan: Telephone follow up appointment with care management team member scheduled for: 06/02/2020      Medication Assistance: None required.  Patient affirms current coverage meets needs.  Patient's preferred pharmacy is:  [facility] - [place], [place] - [address] [place] [address] Phone: [phone] Fax: [phone]  [facility] (New Address) - [place], [place] AT Previously: [address], [place] [address] [place] [address] Phone: [phone] Fax: [phone]  Uses pill box? Yes  Pt endorses good compliance  We discussed: Benefits of medication synchronization, packaging and delivery as well as enhanced pharmacist oversight with [facility]. Patient decided to: Utilize [facility]  pharmacy for medication synchronization, packaging and delivery  Care Plan and Follow Up Patient Decision:  Patient agrees to Care Plan and Follow-up.  Plan: Telephone follow up appointment with care management team member scheduled for:  06/02/2020

## 2020-03-06 ENCOUNTER — Telehealth: Payer: Self-pay | Admitting: Hematology and Oncology

## 2020-03-06 DIAGNOSIS — E1142 Type 2 diabetes mellitus with diabetic polyneuropathy: Secondary | ICD-10-CM | POA: Diagnosis not present

## 2020-03-06 DIAGNOSIS — S90222D Contusion of left lesser toe(s) with damage to nail, subsequent encounter: Secondary | ICD-10-CM | POA: Diagnosis not present

## 2020-03-06 DIAGNOSIS — E11621 Type 2 diabetes mellitus with foot ulcer: Secondary | ICD-10-CM | POA: Diagnosis not present

## 2020-03-06 DIAGNOSIS — L97522 Non-pressure chronic ulcer of other part of left foot with fat layer exposed: Secondary | ICD-10-CM | POA: Diagnosis not present

## 2020-03-06 NOTE — Telephone Encounter (Signed)
Per Angela/Kelli, change 2/14 Provider to [facility]

## 2020-03-09 ENCOUNTER — Inpatient Hospital Stay (INDEPENDENT_AMBULATORY_CARE_PROVIDER_SITE_OTHER): Payer: PPO | Admitting: Hematology and Oncology

## 2020-03-09 ENCOUNTER — Telehealth: Payer: Self-pay | Admitting: Hematology and Oncology

## 2020-03-09 ENCOUNTER — Inpatient Hospital Stay: Payer: PPO

## 2020-03-09 ENCOUNTER — Encounter: Payer: Self-pay | Admitting: Hematology and Oncology

## 2020-03-09 ENCOUNTER — Other Ambulatory Visit: Payer: Self-pay | Admitting: Pharmacist

## 2020-03-09 ENCOUNTER — Other Ambulatory Visit: Payer: Self-pay

## 2020-03-09 VITALS — BP 132/55 | HR 106 | Temp 98.5°F | Resp 18 | Ht 62.0 in | Wt 196.0 lb

## 2020-03-09 VITALS — BP 140/67 | HR 108 | Temp 98.4°F | Resp 18 | Ht 62.0 in | Wt 197.6 lb

## 2020-03-09 DIAGNOSIS — E876 Hypokalemia: Secondary | ICD-10-CM | POA: Diagnosis not present

## 2020-03-09 DIAGNOSIS — Z5111 Encounter for antineoplastic chemotherapy: Secondary | ICD-10-CM | POA: Diagnosis not present

## 2020-03-09 DIAGNOSIS — Z171 Estrogen receptor negative status [ER-]: Secondary | ICD-10-CM

## 2020-03-09 DIAGNOSIS — D649 Anemia, unspecified: Secondary | ICD-10-CM | POA: Diagnosis not present

## 2020-03-09 DIAGNOSIS — C50311 Malignant neoplasm of lower-inner quadrant of right female breast: Secondary | ICD-10-CM

## 2020-03-09 LAB — CBC AND DIFFERENTIAL
HCT: 33 — AB (ref 36–46)
Hemoglobin: 11.1 — AB (ref 12.0–16.0)
Neutrophils Absolute: 1.63
Platelets: 67 — AB (ref 150–399)
WBC: 2.5

## 2020-03-09 LAB — HEPATIC FUNCTION PANEL
ALT: 22 (ref 7–35)
AST: 27 (ref 13–35)
Alkaline Phosphatase: 103 (ref 25–125)
Bilirubin, Total: 0.9

## 2020-03-09 LAB — COMPREHENSIVE METABOLIC PANEL
Albumin: 4.2 (ref 3.5–5.0)
Calcium: 8.6 — AB (ref 8.7–10.7)

## 2020-03-09 LAB — CBC
MCV: 96 (ref 81–99)
RBC: 3.41 — AB (ref 3.87–5.11)

## 2020-03-09 LAB — BASIC METABOLIC PANEL
BUN: 13 (ref 4–21)
CO2: 24 — AB (ref 13–22)
Chloride: 102 (ref 99–108)
Creatinine: 0.7 (ref 0.5–1.1)
Glucose: 140
Potassium: 2.8 — AB (ref 3.4–5.3)
Sodium: 136 — AB (ref 137–147)

## 2020-03-09 MED ORDER — HEPARIN SOD (PORK) LOCK FLUSH 100 UNIT/ML IV SOLN
500.0000 [IU] | Freq: Once | INTRAVENOUS | Status: AC | PRN
  Administered 2020-03-09: 500 [IU]
  Filled 2020-03-09: qty 5

## 2020-03-09 MED ORDER — POTASSIUM CHLORIDE 10 MEQ/100ML IV SOLN
10.0000 meq | INTRAVENOUS | Status: AC
  Administered 2020-03-09 (×4): 10 meq via INTRAVENOUS

## 2020-03-09 MED ORDER — SODIUM CHLORIDE 0.9 % IV SOLN
Freq: Once | INTRAVENOUS | Status: AC
  Filled 2020-03-09: qty 250

## 2020-03-09 MED ORDER — POTASSIUM CHLORIDE 10 MEQ/100ML IV SOLN
INTRAVENOUS | Status: AC
  Filled 2020-03-09: qty 400

## 2020-03-09 NOTE — Telephone Encounter (Signed)
Per Linda [name], patient scheduled for 4 hr (IV-Potassium) for today - put in for 12:00 pm

## 2020-03-09 NOTE — Progress Notes (Signed)
[facility]  [address] [place],  [place]  [address] [phone]  Clinic Day:  03/09/2020  Referring physician: [name], MD   CHIEF COMPLAINT:  CC: Stage IB triple negative breast cancer, status post 4 cycles of Adriamycin/cyclophosphamide  Current Treatment:   Weekly paclitaxel for 12 cycles   HISTORY OF PRESENT ILLNESS:  Linda [name] is a 50 y.o. female who we began seeing in February 2018 for evaluation of anemia.  Her anemia was felt to be secondary to iron deficiency and we recommended continuation of oral iron supplementation for a total of 6 months, as well as referral back to the gastroenterologist.  The patient also has a history of thrombocytopenia and had previously seen Dr. Bobby Rumpf in 2014.  The thrombocytopenia was felt to be secondary to mild chronic immune thrombocytopenic purpura (ITP).  Due to the patient's family history of malignancy, she underwent testing for hereditary cancer syndromes with the My[employer] myRisk Hereditary Cancer Panel test.  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  She underwent a robotic hysterectomy and bilateral salpingo-oophorectomy in May 2018 with Dr. Everitt Amber.  Pathology was benign.  We also discussed the option of risk reducing bilateral mastectomy, but she has chosen to have close surveillance, so we recommended annual MRI breast, in addition to mammography.  She was placed on chemoprevention with raloxifene in September 2018.  CT abdomen and pelvis in August 2020 done for bilateral flank pain, revealed a tiny left renal calculus, as well as probable hepatic cirrhosis without evidence of hepatic mass.  She was then referred to [facility] and is followed by Roosevelt Locks, CRNP in [place] for her liver cirrhosis.  She tested negative for hepatitis  A, B, and C.  She received her hepatitis vaccines in 2020.  We saw her in September 2021 for a new diagnosis of stage IB (T1c N0 M0) triple negative right breast cancer.  She underwent screening bilateral mammogram on August 3rd which revealed possible masses in the right breast.  Diagnostic right mammogram and right breast ultrasound from August 18th confirmed suspicious masses in the right breast at 5 o'clock measuring 1.5 cm and 6 o'clock measuring 1.4 cm.  There was an indeterminate 4 mm with possible distortion in the outer right breast without sonographic correlate.  Right axillary ultrasound was normal.  She then underwent biopsies of both masses and surgical pathology from these procedures revealed  invasive ductal carcinoma, grade 3, with necrosis at 5 o'clock; and invasive ductal carcinoma, grade 1-2, with necrosis and focal myxoid change at 6 o'clock.  No DCIS was identified.  Breast prognostic profiles revealed HER2, and estrogen and progesterone receptors to be negative. Ki67 was 30% at the 5 o'clock mass, and 40% at 6 o'clock.  She underwent bilateral mastectomies on September 24th with Dr. Noberto Retort. Surgical pathology from this procedure revealed invasive ductal carcinoma, grade 3, with myxoid change, 19 mm, at 6 o'clock, and invasive ductal carcinoma, grade 3, and ductal carcinoma in situ, 19 mm, at 5 o'clock.  All margins were negative for invasive carcinoma or DCIS.  Two sentinel lymph nodes were negative for metastatic carcinoma (0/2). CT chest, abdomen and pelvis in September did not reveal any evidence of metastatic disease.  She started dose dense AC chemotherapy on October 25th , with plans to follow this with weekly paclitaxel  for 12 doses.  She had severe restless leg syndrome with oral corticosteroids.  At her visit on November 29th prior to a 3rd cycle of AC chemotherapy she had significantly worsened anemia with a hemoglobin of 8.7, so we decided to wait 3 weeks for her 4th cycle of  chemotherapy.  Due to cytopenias, this had to be delayed at least 2 more weeks, but she finally got her final dose on January 4th.  Iron studies, B12 and folate were normal. Her anemia has slowly improved.  She started weekly paclitaxel on January 26th.   She is taking dexamethasone 39m premedication for paclitaxel 2 tablets the night before and the morning of chemotherapy. She has pre-existing neuropathy of the feet and legs.  INTERVAL HISTORY:  [name] here prior to a 4th cycleof weekly paclitaxel. She states she has been more fatigued this past week.  She denies any changes in her mastectomy sites. She denies fevers or chills. She  denies pain.  She denies worsening neuropathy. Her appetite is fairly good. Her weight has decreased 10 pounds over last 2 weeks.  She states she continues potassium chloride 10 mEq 2 tablets twice daily.  REVIEW OF SYSTEMS:  Review of Systems  Constitutional: Positive for fatigue. Negative for appetite change, chills, fever and unexpected weight change.  HENT:   Negative for lump/mass, mouth sores and sore throat.   Respiratory: Negative for cough and shortness of breath.   Cardiovascular: Negative for chest pain and leg swelling.  Gastrointestinal: Negative for abdominal pain, constipation, diarrhea, nausea and vomiting.  Genitourinary: Negative for difficulty urinating, dysuria, frequency and hematuria.   Musculoskeletal: Negative for arthralgias, back pain and myalgias.  Skin: Negative for rash.  Neurological: Negative for dizziness and headaches.  Hematological: Negative for adenopathy. Does not bruise/bleed easily.  Psychiatric/Behavioral: Negative for depression and sleep disturbance. The patient is not nervous/anxious.     VITALS:  Blood pressure 140/67, pulse (!) 108, temperature 98.4 F (36.9 C), temperature source Oral, resp. rate 18, height 5' 2"  (1.575 m), weight 197 lb 9.6 oz (89.6 kg), last menstrual period 06/13/2009, SpO2 99 %.  Wt Readings from  Last 3 Encounters:  03/09/20 197 lb 9.6 oz (89.6 kg)  03/04/20 204 lb 0.1 oz (92.5 kg)  02/26/20 207 lb 4 oz (94 kg)    Body mass index is 36.14 kg/m.  Performance status (ECOG): 1 - Symptomatic but completely ambulatory  PHYSICAL EXAM:  Physical Exam Vitals and nursing note reviewed.  Constitutional:      General: She is not in acute distress.    Appearance: Normal appearance.  HENT:     Mouth/Throat:     Mouth: Mucous membranes are moist.     Pharynx: Oropharynx is clear. No oropharyngeal exudate or posterior oropharyngeal erythema.  Eyes:     General: No scleral icterus.    Extraocular Movements: Extraocular movements intact.     Conjunctiva/sclera: Conjunctivae normal.     Pupils: Pupils are equal, round, and reactive to light.  Cardiovascular:     Rate and Rhythm: Regular rhythm. Tachycardia present.     Heart sounds: Normal heart sounds. No murmur heard. No friction rub. No gallop.      Comments: Mild tachycardia. Pulmonary:     Effort: Pulmonary effort is normal. No respiratory distress.     Breath sounds: Normal breath sounds. No stridor. No wheezing, rhonchi or rales.  Chest:  Breasts:     Right: No axillary adenopathy or supraclavicular adenopathy.  Left: No axillary adenopathy or supraclavicular adenopathy.      Comments: Chest wall exam is deferred Abdominal:     General: There is no distension.     Palpations: Abdomen is soft. There is hepatomegaly. There is no splenomegaly or mass.     Tenderness: There is no abdominal tenderness. There is no guarding.     Comments: Liver is palpable about 6 cm below the right costal margin. Spleen difficult to palpate.  Musculoskeletal:     Cervical back: Normal range of motion and neck supple. No tenderness.     Right lower leg: No edema.     Left lower leg: No edema.  Lymphadenopathy:     Cervical: No cervical adenopathy.     Upper Body:     Right upper body: No supraclavicular or axillary adenopathy.     Left  upper body: No supraclavicular or axillary adenopathy.     Lower Body: No right inguinal adenopathy. No left inguinal adenopathy.  Skin:    General: Skin is warm.     Coloration: Skin is not jaundiced.     Findings: No rash.  Neurological:     Mental Status: She is alert and oriented to person, place, and time.     Cranial Nerves: No cranial nerve deficit.  Psychiatric:        Mood and Affect: Mood normal.        Behavior: Behavior normal.        Thought Content: Thought content normal.    LABS:   CBC Latest Ref Rng & Units 03/09/2020 03/02/2020 02/24/2020  WBC - 2.5 3.0 5.5  Hemoglobin 12.0 - 16.0 11.1(A) 10.5(A) 10.1(A)  Hematocrit 36 - 46 33(A) 32(A) 30(A)  Platelets 150 - 399 67(A) 70(A) 130(A)   CMP Latest Ref Rng & Units 03/09/2020 03/02/2020 02/24/2020  Glucose 70 - 99 mg/dL - - -  BUN 4 - 21 13 10 12   Creatinine 0.5 - 1.1 0.7 0.7 0.8  Sodium 137 - 147 136(A) 138 136(A)  Potassium 3.4 - 5.3 2.8(A) 3.3(A) 4.1  Chloride 99 - 108 102 104 102  CO2 13 - 22 24(A) 26(A) 24(A)  Calcium 8.7 - 10.7 8.6(A) 8.8 9.3  Total Protein 6.5 - 8.1 g/dL - - -  Total Bilirubin 0.3 - 1.2 mg/dL - - -  Alkaline Phos 25 - 125 103 77 86  AST 13 - 35 27 35 34  ALT 7 - 35 22 22 24      No results found for: CEA1 / No results found for: CEA1 No results found for: PSA1 No results found for: CNO709 No results found for: CAN125  No results found for: TOTALPROTELP, ALBUMINELP, A1GS, A2GS, BETS, BETA2SER, GAMS, MSPIKE, SPEI Lab Results  Component Value Date   TIBC 300 01/13/2020   IRONPCTSAT 24.3 01/13/2020   No results found for: LDH  STUDIES:  No results found.    HISTORY:   Past Medical History:  Diagnosis Date  . Anemia   . Anemia, unspecified   . Anemia, unspecified   . Anemia, unspecified   . Anxiety   . Arthritis   . BRCA gene mutation positive   . Chronic pain syndrome   . Depression   . Diabetes mellitus without complication ([place])    type 2  . Gastritis   . Genetic  susceptibility to malignant neoplasm of breast   . 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 GERD (gastroesophageal reflux disease)   . History  of kidney stones   . Hypertension   . Hypothyroidism   . Malignant neoplasm of central portion of right female breast ([place])   . Malignant neoplasm of lower-inner quadrant of right female breast ([place])   . Malignant neoplasm of lower-inner quadrant of right female breast ([place])   . Mixed hyperlipidemia   . Other primary thrombocytopenia ([place])   . Sleep apnea    hx of . No longer has    Past Surgical History:  Procedure Laterality Date  . APPENDECTOMY    . BACK SURGERY     x 3 lower disc  . BILATERAL TOTAL MASTECTOMY WITH AXILLARY LYMPH NODE DISSECTION Bilateral 09/2019  . CHOLECYSTECTOMY    . nephrolithiasis    . ROBOTIC ASSISTED TOTAL HYSTERECTOMY WITH BILATERAL SALPINGO OOPHERECTOMY Bilateral 06/14/2016   Procedure: ROBOTIC ASSISTED TOTAL HYSTERECTOMY WITH BILATERAL SALPINGO OOPHORECTOMY;  Surgeon: Nancy Marus, MD;  Location: [facility];  Service: Gynecology;  Laterality: Bilateral;    Family History  Problem Relation Age of Onset  . Cancer Mother        breast  . CAD Father   . Diabetes Father   . Heart failure Father   . Cancer Father        renal carcinoma  . Kidney failure Father   . Stroke Paternal Grandmother     Social History:  reports that she has never smoked. She has never used smokeless tobacco. She reports that she does not drink alcohol and does not use drugs.The patient is alone today.  Allergies:  Allergies  Allergen Reactions  . Codeine Shortness Of Breath  . Celecoxib Other (See Comments)    Unknown reaction  . Ezetimibe-Simvastatin Other (See Comments)    Unknown reaction  . Propranolol Hcl Other (See Comments)    Unknown reaction    Current Medications: Current Outpatient Medications  Medication Sig Dispense Refill  . benzonatate (TESSALON) 100 MG capsule Take 1 capsule (100 mg total) by  mouth 3 (three) times daily as needed for cough. 30 capsule 0  . Blood Glucose Monitoring Suppl (ONETOUCH VERIO REFLECT) w/Device KIT AS DIRECTED    . buPROPion (WELLBUTRIN XL) 300 MG 24 hr tablet TAKE ONE TABLET BY MOUTH EVERY EVENING 90 tablet 1  . calcium citrate-vitamin D (CITRACAL+D) 315-200 MG-UNIT tablet Take 1 tablet by mouth 2 (two) times daily.    . clotrimazole-betamethasone (LOTRISONE) cream Apply small amount to affected area twice daily (Patient taking differently: as needed. Apply small amount to affected area twice daily) 45 g 0  . Continuous Blood Gluc Receiver (FREESTYLE LIBRE 2 READER) DEVI E11.69 Check blood sugar 4 times daily as directed 1 each 0  . Continuous Blood Gluc Sensor (FREESTYLE LIBRE 2 SENSOR) MISC E11.69 Change sensor every 14 days as directed 6 each 3  . Continuous Blood Gluc Transmit (DEXCOM G6 TRANSMITTER) MISC 1 each by Does not apply route every 3 (three) months. 1 each 3  . cyclobenzaprine (FLEXERIL) 10 MG tablet TAKE ONE TABLET BY MOUTH EVERY 8 HOURS AS NEEDED FOR MUSCLE SPASMS 30 tablet 3  . dexamethasone (DECADRON) 4 MG tablet Take 4 mg by mouth 2 (two) times daily with a meal.    . dicyclomine (BENTYL) 20 MG tablet TAKE ONE TABLET BY MOUTH BEFORE MEALS AND AT BEDTIME AS NEEDED FOR STOMACH CRAMPING 180 tablet 1  . Dulaglutide (TRULICITY) 6.26 RS/8.5IO SOPN INJECT ONE SYRINGE ONCE A WEEK 6 mL 1  . Empagliflozin-Linaglip-Metform 5-2.05-998 MG TB24 Take 1,000 mg by mouth daily.    Marland Kitchen  Empagliflozin-metFORMIN HCl (SYNJARDY) 12.05-998 MG TABS Take 1 tablet by mouth daily. 90 tablet 0  . famotidine (PEPCID) 20 MG tablet TAKE ONE TABLET BY MOUTH TWICE DAILY 180 tablet 1  . ferrous sulfate 325 (65 FE) MG tablet Take 325 mg by mouth every evening.    Marland Kitchen FETZIMA 80 MG CP24 TAKE ONE CAPSULE BY MOUTH ONCE DAILY 90 capsule 1  . gabapentin (NEURONTIN) 300 MG capsule Take 1 capsule (300 mg total) by mouth 3 (three) times daily. 270 capsule 0  . glucose blood test strip 1  each by Other route in the morning and at bedtime. One Touch Verio - Patient checks blood sugar 2-3 times daily. 100 each 2  . insulin aspart (NOVOLOG FLEXPEN) 100 UNIT/ML FlexPen Three times before meals while on prednisone. 15 mL 11  . Insulin Pen Needle (BD PEN NEEDLE NANO U/F) 32G X 4 MM MISC Inject 1 each into the skin in the morning and at bedtime. 200 each 3  . LANTUS SOLOSTAR 100 UNIT/ML Solostar Pen INJECT 68 UNITS SUBCUTANEOUSLY EVERYDAY AT BEDTIME 15 mL 2  . levothyroxine (SYNTHROID) 75 MCG tablet TAKE ONE TABLET BY MOUTH ONCE DAILY 90 tablet 1  . loratadine (CLARITIN) 10 MG tablet Take 10 mg by mouth daily. Taking 3-4 days each week due to bone pain associated with shot from cancer center.    Marland Kitchen LORazepam (ATIVAN) 0.5 MG tablet TAKE ONE TABLET BY MOUTH TWICE DAILY AS NEEDED FOR ANXIETY 30 tablet 2  . losartan (COZAAR) 50 MG tablet TAKE ONE TABLET BY MOUTH EVERY EVENING 90 tablet 1  . morphine (MS CONTIN) 30 MG 12 hr tablet Take 1 tablet (30 mg total) by mouth every 12 (twelve) hours. 180 tablet 0  . Multiple Vitamin (MULTIVITAMIN WITH MINERALS) TABS tablet Take 1 tablet by mouth daily.     . ondansetron (ZOFRAN) 4 MG tablet Take 1 tablet (4 mg total) by mouth every 4 (four) hours as needed for nausea. 90 tablet 3  . OneTouch Delica Lancets 97C MISC 1 each by Does not apply route daily before breakfast. Check blood sugar twice daily. 100 each 3  . pantoprazole (PROTONIX) 40 MG tablet TAKE ONE TABLET BY MOUTH TWICE DAILY 180 tablet 1  . Pegfilgrastim-jmdb (FULPHILA Jordan) Inject into the skin.    . potassium chloride (MICRO-K) 10 MEQ CR capsule Take by mouth.    . pravastatin (PRAVACHOL) 20 MG tablet Take 20 mg by mouth at bedtime.    . prochlorperazine (COMPAZINE) 10 MG tablet Take 1 tablet (10 mg total) by mouth every 6 (six) hours as needed for nausea or vomiting. 90 tablet 3  . senna (SENOKOT) 8.6 MG tablet Take 1 tablet by mouth daily as needed for constipation.    . SODIUM FLUORIDE 5000  SENSITIVE 1.1-5 % GEL Take by mouth 2 (two) times daily.    Marland Kitchen Specialty Vitamins Products (MAGNESIUM, AMINO ACID CHELATE,) 133 MG tablet Take 1 tablet by mouth at bedtime.    . sucralfate (CARAFATE) 1 g tablet Take 1 tablet (1 g total) by mouth 4 (four) times daily -  with meals and at bedtime. 270 tablet 1  . Vitamin D, Ergocalciferol, (DRISDOL) 1.25 MG (50000 UNIT) CAPS capsule TAKE ONE CAPSULE BY MOUTH ONCE WEEKLY ON FRIDAY 12 capsule 1  . zolpidem (AMBIEN) 10 MG tablet TAKE ONE TABLET BY MOUTH EVERYDAY AT BEDTIME AS NEEDED 90 tablet 0   No current facility-administered medications for this visit.     ASSESSMENT & PLAN:  Assessment: 1. Positive BRCA 1 mutation, which increases her risk for breast and ovarian cancer, as well as elevates her risk for pancreatic cancer, for which she underwent hysterectomy/bilateral salpingo-oophorectomy and is on chemoprevention with raloxifene and regular surveillance.   She initially declined bilateral prophylactic mastectomies.   2.  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  She has undergone bilateral mastectomies.  She completed 4 cycles of Adriamycin/cyclophosphamide chemotherapy and is now receiving weekly paclitaxel and tolerating this well.  3.  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  We feel this is a separate primary.  She has undergone bilateral mastectomies.  She completed 4 cycles of Adriamycin/cyclophosphamide chemotherapy and is now receiving weekly paclitaxel and tolerating this well.  4. Thrombocytopenia, which is felt to be due to chronic ITP. This has worsened with chemotherapy.  We will proceed with chemotherapy this week with platelet count of 67,000 and continue to monitor this weekly.  We will  need to consider dose adjustment if this worsens further. She knows to contact us has abnormal bleeding or bruising.  5. Liver cirrhosis as seen on CT imaging in August 2020.  She has been referred and is being seen by Roosevelt Locks, CRNP, at the [facility] in [place].  Hepatitis panel was negative and she has received the appropriate vaccines.  Abdominal ultrasound from April 2021 was stable and CT imaging from September 2021 was stable.    6. Anemia secondary to chemotherapy, which continues to improve.  7. Diabetes, which is under better control. As she has tolerated paclitaxel well, she is no longer on steroid premedication.  8. Hypokalemia, despite potassium chloride 10 mEq 2 tablets twice daily. I will give her IV potassium today and increase her oral potassium chloride 10 meq to 2 tablets  3 times daily.  9. Mild neutropenia secondary to chemotherapy. We will continue to monitor this.   Plan:  She will proceed with a 4th cycle of weekly paclitaxel on February 16th and we will continue weekly labs and paclitaxel. Due to the thrombocytopenia neutropenia, we may need to make dose adjustments in the near future.   We will plan to see her back in 3 weeks with a CBC and comprehensive metabolic panel for re-examination.  The patient understands the plans discussed today and is in agreement with them.  She knows to contact our office if she develops concerns prior to her next appointment.     Marvia Pickles, PA-C

## 2020-03-09 NOTE — Telephone Encounter (Signed)
Per 2/14 los next appt sched and given to patient

## 2020-03-09 NOTE — Patient Instructions (Signed)
Potassium acetate injection What is this medicine? POTASSIUM ACETATE (poe TASS i um ASa tate) is a potassium supplement used to prevent and to treat low potassium. Potassium is important for the heart, muscles, and nerves. Too much or too little potassium in the body can cause serious problems. This medicine may be used for other purposes; ask your health care provider or pharmacist if you have questions. What should I tell my health care provider before I take this medicine? They need to know if you have any of these conditions:  Addison's disease  dehydration  diabetes  heart disease  high levels of potassium in the blood  irregular heartbeat  kidney disease  liver disease  recent severe burn  an unusual or allergic reaction to potassium, other medicines, foods, dyes, or preservatives  pregnant or trying to get pregnant  breast-feeding How should I use this medicine? This medicine is for infusion into a vein. It is given by a health care professional in a hospital or clinic setting. Talk to your pediatrician regarding the use of this medicine in children. Special care may be needed. Overdosage: If you think you have taken too much of this medicine contact a poison control center or emergency room at once. NOTE: This medicine is only for you. Do not share this medicine with others. What if I miss a dose? This does not apply. What may interact with this medicine? Do not take this medicine with any of the following medications:  certain diuretics such as spironolactone, triamterene  eplerenone  sodium polystyrene sulfonate This medicine may also interact with the following medications:  certain medicines for blood pressure or heart disease like lisinopril, losartan, quinapril, valsartan  medicines that lower your chance of fighting infection such as cyclosporine, tacrolimus  NSAIDs, medicines for pain and inflammation, like ibuprofen or naproxen  other potassium  supplements  salt substitutes This list may not describe all possible interactions. Give your health care provider a list of all the medicines, herbs, non-prescription drugs, or dietary supplements you use. Also tell them if you smoke, drink alcohol, or use illegal drugs. Some items may interact with your medicine. What should I watch for while using this medicine? Your condition will be monitored carefully while you are receiving this medicine. You may need blood work done while you are taking this medicine. What side effects may I notice from receiving this medicine? Side effects that you should report to your doctor or health care professional as soon as possible:  allergic reactions like skin rash, itching or hives, swelling of the face, lips, or tongue  breathing problems  confusion  fast, irregular heartbeat  feeling faint or lightheaded, falls  low blood pressure  numbness or tingling in hands or feet  pain, redness, or irritation at site where injected  unusually weak or tired  weakness, heaviness of legs Side effects that usually do not require medical attention (report these to your doctor or health care professional if they continue or are bothersome):  diarrhea  nausea, vomiting  stomach pain This list may not describe all possible side effects. Call your doctor for medical advice about side effects. You may report side effects to FDA at 1-800-FDA-1088. Where should I keep my medicine? This drug is given in a hospital or clinic and will not be stored at home. NOTE: This sheet is a summary. It may not cover all possible information. If you have questions about this medicine, talk to your doctor, pharmacist, or health care provider.  2021 Elsevier/Gold Standard (2015-10-14 09:51:49)

## 2020-03-11 ENCOUNTER — Other Ambulatory Visit: Payer: Self-pay

## 2020-03-11 ENCOUNTER — Other Ambulatory Visit: Payer: Self-pay | Admitting: Family Medicine

## 2020-03-11 ENCOUNTER — Other Ambulatory Visit: Payer: Self-pay | Admitting: Pharmacist

## 2020-03-11 ENCOUNTER — Telehealth: Payer: Self-pay

## 2020-03-11 ENCOUNTER — Inpatient Hospital Stay: Payer: PPO

## 2020-03-11 VITALS — BP 127/62 | HR 101 | Temp 98.1°F | Resp 16 | Ht 62.0 in | Wt 198.8 lb

## 2020-03-11 DIAGNOSIS — Z5111 Encounter for antineoplastic chemotherapy: Secondary | ICD-10-CM | POA: Diagnosis not present

## 2020-03-11 DIAGNOSIS — Z23 Encounter for immunization: Secondary | ICD-10-CM

## 2020-03-11 DIAGNOSIS — Z171 Estrogen receptor negative status [ER-]: Secondary | ICD-10-CM

## 2020-03-11 DIAGNOSIS — C50311 Malignant neoplasm of lower-inner quadrant of right female breast: Secondary | ICD-10-CM

## 2020-03-11 MED ORDER — DIPHENHYDRAMINE HCL 50 MG/ML IJ SOLN
50.0000 mg | Freq: Once | INTRAMUSCULAR | Status: AC
  Administered 2020-03-11: 50 mg via INTRAVENOUS

## 2020-03-11 MED ORDER — SODIUM CHLORIDE 0.9 % IV SOLN
Freq: Once | INTRAVENOUS | Status: AC
  Filled 2020-03-11: qty 250

## 2020-03-11 MED ORDER — DEXAMETHASONE SODIUM PHOSPHATE 10 MG/ML IJ SOLN
8.0000 mg | Freq: Once | INTRAMUSCULAR | Status: AC
Start: 2020-03-11 — End: 2020-03-11
  Administered 2020-03-11: 8 mg via INTRAVENOUS

## 2020-03-11 MED ORDER — SODIUM CHLORIDE 0.9 % IV SOLN
80.0000 mg/m2 | Freq: Once | INTRAVENOUS | Status: AC
  Administered 2020-03-11: 162 mg via INTRAVENOUS
  Filled 2020-03-11: qty 27

## 2020-03-11 MED ORDER — HEPARIN SOD (PORK) LOCK FLUSH 100 UNIT/ML IV SOLN
500.0000 [IU] | Freq: Once | INTRAVENOUS | Status: DC | PRN
  Filled 2020-03-11: qty 5

## 2020-03-11 MED ORDER — FAMOTIDINE IN NACL 20-0.9 MG/50ML-% IV SOLN
20.0000 mg | Freq: Once | INTRAVENOUS | Status: AC
  Administered 2020-03-11: 20 mg via INTRAVENOUS

## 2020-03-11 MED ORDER — DEXAMETHASONE SODIUM PHOSPHATE 10 MG/ML IJ SOLN
INTRAMUSCULAR | Status: AC
  Filled 2020-03-11: qty 1

## 2020-03-11 MED ORDER — DIPHENHYDRAMINE HCL 50 MG/ML IJ SOLN
INTRAMUSCULAR | Status: AC
  Filled 2020-03-11: qty 1

## 2020-03-11 MED ORDER — SYNJARDY 12.5-1000 MG PO TABS: 1.0000 | ORAL_TABLET | Freq: Two times a day (BID) | ORAL | 0 refills | Status: DC

## 2020-03-11 MED ORDER — HEPATITIS B VAC RECOMBINANT 20 MCG/ML IJ SUSP
1.0000 mL | Freq: Once | INTRAMUSCULAR | Status: AC
  Administered 2020-03-11: 20 ug via INTRAMUSCULAR
  Filled 2020-03-11: qty 1

## 2020-03-11 MED ORDER — FAMOTIDINE IN NACL 20-0.9 MG/50ML-% IV SOLN
INTRAVENOUS | Status: AC
  Filled 2020-03-11: qty 50

## 2020-03-11 NOTE — Progress Notes (Signed)
Chronic Care Management Pharmacy Assistant   Name: Linda [name]  MRN: [id number] DOB: [birth year]  Reason for Encounter: Medication Review for upstream delivery  Patient Questions:  1.  Have you seen any other providers since your last visit?  03/11/20-Oncology 03/06/20-podiatry  2.  Any changes in your medicines or health? No     PCP : [name], MD  Allergies:   Allergies  Allergen Reactions  . Codeine Shortness Of Breath  . Celecoxib Other (See Comments)    Unknown reaction  . Ezetimibe-Simvastatin Other (See Comments)    Unknown reaction  . Propranolol Hcl Other (See Comments)    Unknown reaction    Medications: Outpatient Encounter Medications as of 03/11/2020  Medication Sig Note  . benzonatate (TESSALON) 100 MG capsule Take 1 capsule (100 mg total) by mouth 3 (three) times daily as needed for cough.   . Blood Glucose Monitoring Suppl (ONETOUCH VERIO REFLECT) w/Device KIT AS DIRECTED   . buPROPion (WELLBUTRIN XL) 300 MG 24 hr tablet TAKE ONE TABLET BY MOUTH EVERY EVENING   . calcium citrate-vitamin D (CITRACAL+D) 315-200 MG-UNIT tablet Take 1 tablet by mouth 2 (two) times daily.   . clotrimazole-betamethasone (LOTRISONE) cream Apply small amount to affected area twice daily (Patient taking differently: as needed. Apply small amount to affected area twice daily)   . Continuous Blood Gluc Receiver (FREESTYLE LIBRE 2 READER) DEVI E11.69 Check blood sugar 4 times daily as directed   . Continuous Blood Gluc Sensor (FREESTYLE LIBRE 2 SENSOR) MISC E11.69 Change sensor every 14 days as directed   . Continuous Blood Gluc Transmit (DEXCOM G6 TRANSMITTER) MISC 1 each by Does not apply route every 3 (three) months.   . cyclobenzaprine (FLEXERIL) 10 MG tablet TAKE ONE TABLET BY MOUTH EVERY 8 HOURS AS NEEDED FOR MUSCLE SPASMS   . dexamethasone (DECADRON) 4 MG tablet Take 4 mg by mouth 2 (two) times daily with a meal.   . dicyclomine (BENTYL) 20 MG tablet TAKE ONE TABLET  BY MOUTH BEFORE MEALS AND AT BEDTIME AS NEEDED FOR STOMACH CRAMPING   . Dulaglutide (TRULICITY) 3.76 EG/3.1DV SOPN INJECT ONE SYRINGE ONCE A WEEK   . Empagliflozin-Linaglip-Metform 5-2.05-998 MG TB24 Take 1,000 mg by mouth daily.   . Empagliflozin-metFORMIN HCl (SYNJARDY) 12.05-998 MG TABS Take 1 tablet by mouth daily.   . famotidine (PEPCID) 20 MG tablet TAKE ONE TABLET BY MOUTH TWICE DAILY   . ferrous sulfate 325 (65 FE) MG tablet Take 325 mg by mouth every evening.   Marland Kitchen FETZIMA 80 MG CP24 TAKE ONE CAPSULE BY MOUTH ONCE DAILY   . gabapentin (NEURONTIN) 300 MG capsule Take 1 capsule (300 mg total) by mouth 3 (three) times daily.   Marland Kitchen glucose blood test strip 1 each by Other route in the morning and at bedtime. One Touch Verio - Patient checks blood sugar 2-3 times daily.   . insulin aspart (NOVOLOG FLEXPEN) 100 UNIT/ML FlexPen Three times before meals while on prednisone.   . Insulin Pen Needle (BD PEN NEEDLE NANO U/F) 32G X 4 MM MISC Inject 1 each into the skin in the morning and at bedtime.   Marland Kitchen LANTUS SOLOSTAR 100 UNIT/ML Solostar Pen INJECT 68 UNITS SUBCUTANEOUSLY EVERYDAY AT BEDTIME   . levothyroxine (SYNTHROID) 75 MCG tablet TAKE ONE TABLET BY MOUTH ONCE DAILY   . loratadine (CLARITIN) 10 MG tablet Take 10 mg by mouth daily. Taking 3-4 days each week due to bone pain associated with shot from cancer center.   Marland Kitchen  LORazepam (ATIVAN) 0.5 MG tablet TAKE ONE TABLET BY MOUTH TWICE DAILY AS NEEDED FOR ANXIETY   . losartan (COZAAR) 50 MG tablet TAKE ONE TABLET BY MOUTH EVERY EVENING   . morphine (MS CONTIN) 30 MG 12 hr tablet Take 1 tablet (30 mg total) by mouth every 12 (twelve) hours.   . Multiple Vitamin (MULTIVITAMIN WITH MINERALS) TABS tablet Take 1 tablet by mouth daily.  06/08/2018: Pt plans on purchasing again when she can get out.  . ondansetron (ZOFRAN) 4 MG tablet Take 1 tablet (4 mg total) by mouth every 4 (four) hours as needed for nausea.   Glory Rosebush Delica Lancets 52W MISC 1 each by Does  not apply route daily before breakfast. Check blood sugar twice daily.   . pantoprazole (PROTONIX) 40 MG tablet TAKE ONE TABLET BY MOUTH TWICE DAILY   . Pegfilgrastim-jmdb (FULPHILA [place]) Inject into the skin.   . potassium chloride (MICRO-K) 10 MEQ CR capsule Take by mouth.   . pravastatin (PRAVACHOL) 20 MG tablet Take 20 mg by mouth at bedtime.   . prochlorperazine (COMPAZINE) 10 MG tablet Take 1 tablet (10 mg total) by mouth every 6 (six) hours as needed for nausea or vomiting.   . senna (SENOKOT) 8.6 MG tablet Take 1 tablet by mouth daily as needed for constipation.   . SODIUM FLUORIDE 5000 SENSITIVE 1.1-5 % GEL Take by mouth 2 (two) times daily.   Marland Kitchen Specialty Vitamins Products (MAGNESIUM, AMINO ACID CHELATE,) 133 MG tablet Take 1 tablet by mouth at bedtime.   . sucralfate (CARAFATE) 1 g tablet Take 1 tablet (1 g total) by mouth 4 (four) times daily -  with meals and at bedtime.   . Vitamin D, Ergocalciferol, (DRISDOL) 1.25 MG (50000 UNIT) CAPS capsule TAKE ONE CAPSULE BY MOUTH ONCE WEEKLY ON FRIDAY   . zolpidem (AMBIEN) 10 MG tablet TAKE ONE TABLET BY MOUTH EVERYDAY AT BEDTIME AS NEEDED    Facility-Administered Encounter Medications as of 03/11/2020  Medication  . heparin lock flush 100 unit/mL  . PACLitaxel (TAXOL) 162 mg in sodium chloride 0.9 % 250 mL chemo infusion (</= 82m/m2)    Current Diagnosis: Patient Active Problem List   Diagnosis Date Noted  . Hypokalemia 03/09/2020  . Chemotherapy-induced thrombocytopenia 02/06/2020  . Thrombocytopenia, unspecified ([facility] 01/27/2020  . Malignant neoplasm of lower-inner quadrant of right breast of female, estrogen receptor negative ([facility] 10/01/2019  . Chronic pain syndrome 07/08/2019  . Memory loss 07/08/2019  . Depression, major, recurrent, mild ([facility] 07/08/2019  . Uncomplicated opioid dependence ([facility] 07/08/2019  . Mixed hyperlipidemia 04/11/2019  . Dyslipidemia associated with type 2 diabetes mellitus ([facility] 04/11/2019  . Essential  hypertension, benign 04/11/2019  . Major depressive disorder, single episode, mild ([facility] 04/11/2019  . Acquired hypothyroidism 04/11/2019  . Vitamin D insufficiency 04/11/2019  . Class 2 severe obesity due to excess calories with serious comorbidity and body mass index (BMI) of 35.0 to 35.9 in adult ([facility] 04/11/2019  . Pre-ulcerative calluses 03/04/2019  . Liver cirrhosis secondary to NASH (nonalcoholic steatohepatitis) ([facility] 09/13/2018    Class: Chronic  . BRCA1 positive 06/14/2016    Reviewed chart for medication changes ahead of medication coordination call.  No OVs, Consults, or hospital visits since last care coordination call/Pharmacist visit. (If appropriate, list visit date, provider name)  No medication changes indicated OR if recent visit, treatment plan here.  BP Readings from Last 3 Encounters:  03/11/20 127/62  03/09/20 (!) 132/55  03/09/20 140/67    Lab Results  Component Value Date   HGBA1C 6.9 (H) 10/09/2019     Patient obtains medications through Vials  30 Days   Last adherence delivery included:  Famotidine 20 mg- 1 tablet twice daily Fetzima-80 mg- 1 capsule daily Pravastatin 20 mg- 1 tablet at bedtime  Lantus Solostar U 100- inject 0.21minto skin at bedtime Onetouch verio test strops use to check blood sugar 2-3 times daily Ondansetron 474m Take 1 tablet every 4 hours as needed for nausea Lorazepam 0.5 mg- 1 tablet twice a day as needed Sucralfate 1 gram- 1 tablet four times a day with meals and at bedtime. Cyclobenzaprine 10 mg - 1 tablet by mouth every 8 hours as needed.  Potassium chloride ER 10 meq- 2 capsules twice daily Vitamin D 50,000 units - 1 capsule weekly on Fridays Bupropion Hcl Xl 300 mg- 1 tablet in evening Levothyroxine 75 mcg- 1 tablet daily Losartan Potassium 50 mg- 1 tablet in evening Zolpidem 10 mg- 1 tablet as needed at bedtime Gabapentin 300 mg- 1 capsule twice daily Synjardy 12.05-998 mg- 1 tablet  daily Trulicity-inject 1 syringe once a week Pantoprazole 40 mgtake 1 tablet twice daily  Patient declined last month: Benzonatate 10079m Finished course Prochlorperazine Maleate 2m56m tablet every 6 hours as needed for nausea and vomiting Prevident 5000 enamel protectant Morphine Sulfate 30 mg -2 times daily PRN- 90 DS on 12/26/19 Cephalexin 500mg58mmpleted course Novalog inj flex pen- 3 month supply on 12/04/19 Dexcom Dexamethasone 4 mg- States she has completed course Methocarbamol 500 mg- States she no longer takes. Takes flexeril instead.  Pen Needles-  Lancets-  Dicyclomine 20 mg- Uses PRN has plenty on hand  Patient is due for next adherence delivery on: 04/01/2020 Called patient and reviewed medications and coordinated delivery.  This delivery to include acute fill for  Synjardy 12.5 1000  Patient declined the following medications due to 30 ds on 03/03/2020 Famotidine 20 mg- 1 tablet twice daily Fetzima-80 mg- 1 capsule daily Pravastatin 20 mg- 1 tablet at bedtime  Lantus Solostar U 100- inject 0.68mLi83mskin at bedtime Onetouch verio test strops use to check blood sugar 2-3 times daily Ondansetron 4mg- T71m 1 tablet every 4 hours as needed for nausea Lorazepam 0.5 mg- 1 tablet twice a day as needed Sucralfate 1 gram- 1 tablet four times a day with meals and at bedtime. Cyclobenzaprine 10 mg - 1 tablet by mouth every 8 hours as needed.  Potassium chloride ER 10 meq- 2 capsules twice daily Vitamin D 50,000 units - 1 capsule weekly on Fridays Bupropion Hcl Xl 300 mg- 1 tablet in evening Levothyroxine 75 mcg- 1 tablet daily Losartan Potassium 50 mg- 1 tablet in evening Zolpidem 10 mg- 1 tablet as needed at bedtime Gabapentin 300 mg- 1 capsule twice daily Synjardy 12.05-998 mg- 1 tablet daily Trulicity-inject 1 syringe once a week Pantoprazole 40 mgtake 1 tablet twice daily  Patient needs refills for: None  Confirmed delivery date of 03/11/2020 for  acute fill, advised patient that pharmacy will contact them the morning of delivery.  Follow-Up:  Coordination of Enhanced Pharmacy Services and Pharmacist Review  Sara BrDonette Larryotified  Tami KiClarita LeberliAdwolfcist Assistant [phone]

## 2020-03-11 NOTE — Progress Notes (Unsigned)
Sending new dose of Synjardy 12.5-1000mg. New dose: take 2 tablets daily. Per Emeterio Reeve.  [name], [place] 03/11/20 3:08 PM

## 2020-03-11 NOTE — Patient Instructions (Signed)
[facility] Discharge Instructions for Patients Receiving Chemotherapy   Today you received the following chemotherapy agents Paclitaxel  To help prevent nausea and vomiting after your treatment, we encourage you to take your nausea medication.   Paclitaxel injection What is this medicine? PACLITAXEL (PAK li TAX el) is a chemotherapy drug. It targets fast dividing cells, like cancer cells, and causes these cells to die. This medicine is used to treat ovarian cancer, breast cancer, lung cancer, Kaposi's sarcoma, and other cancers. This medicine may be used for other purposes; ask your health care provider or pharmacist if you have questions. COMMON BRAND NAME(S): Onxol, Taxol What should I tell my health care provider before I take this medicine? They need to know if you have any of these conditions:  history of irregular heartbeat  liver disease  low blood counts, like low white cell, platelet, or red cell counts  lung or breathing disease, like asthma  tingling of the fingers or toes, or other nerve disorder  an unusual or allergic reaction to paclitaxel, alcohol, polyoxyethylated castor oil, other chemotherapy, other medicines, foods, dyes, or preservatives  pregnant or trying to get pregnant  breast-feeding How should I use this medicine? This drug is given as an infusion into a vein. It is administered in a hospital or clinic by a specially trained health care professional. Talk to your pediatrician regarding the use of this medicine in children. Special care may be needed. Overdosage: If you think you have taken too much of this medicine contact a poison control center or emergency room at once. NOTE: This medicine is only for you. Do not share this medicine with others. What if I miss a dose? It is important not to miss your dose. Call your doctor or health care professional if you are unable to keep an appointment. What may interact with this  medicine? Do not take this medicine with any of the following medications:  live virus vaccines This medicine may also interact with the following medications:  antiviral medicines for hepatitis, HIV or AIDS  certain antibiotics like erythromycin and clarithromycin  certain medicines for fungal infections like ketoconazole and itraconazole  certain medicines for seizures like carbamazepine, phenobarbital, phenytoin  gemfibrozil  nefazodone  rifampin  St. John's wort This list may not describe all possible interactions. Give your health care provider a list of all the medicines, herbs, non-prescription drugs, or dietary supplements you use. Also tell them if you smoke, drink alcohol, or use illegal drugs. Some items may interact with your medicine. What should I watch for while using this medicine? Your condition will be monitored carefully while you are receiving this medicine. You will need important blood work done while you are taking this medicine. This medicine can cause serious allergic reactions. To reduce your risk you will need to take other medicine(s) before treatment with this medicine. If you experience allergic reactions like skin rash, itching or hives, swelling of the face, lips, or tongue, tell your doctor or health care professional right away. In some cases, you may be given additional medicines to help with side effects. Follow all directions for their use. This drug may make you feel generally unwell. This is not uncommon, as chemotherapy can affect healthy cells as well as cancer cells. Report any side effects. Continue your course of treatment even though you feel ill unless your doctor tells you to stop. Call your doctor or health care professional for advice if you get a fever,  chills or sore throat, or other symptoms of a cold or flu. Do not treat yourself. This drug decreases your body's ability to fight infections. Try to avoid being around people who are  sick. This medicine may increase your risk to bruise or bleed. Call your doctor or health care professional if you notice any unusual bleeding. Be careful brushing and flossing your teeth or using a toothpick because you may get an infection or bleed more easily. If you have any dental work done, tell your dentist you are receiving this medicine. Avoid taking products that contain aspirin, acetaminophen, ibuprofen, naproxen, or ketoprofen unless instructed by your doctor. These medicines may hide a fever. Do not become pregnant while taking this medicine. Women should inform their doctor if they wish to become pregnant or think they might be pregnant. There is a potential for serious side effects to an unborn child. Talk to your health care professional or pharmacist for more information. Do not breast-feed an infant while taking this medicine. Men are advised not to father a child while receiving this medicine. This product may contain alcohol. Ask your pharmacist or healthcare provider if this medicine contains alcohol. Be sure to tell all healthcare providers you are taking this medicine. Certain medicines, like metronidazole and disulfiram, can cause an unpleasant reaction when taken with alcohol. The reaction includes flushing, headache, nausea, vomiting, sweating, and increased thirst. The reaction can last from 30 minutes to several hours. What side effects may I notice from receiving this medicine? Side effects that you should report to your doctor or health care professional as soon as possible:  allergic reactions like skin rash, itching or hives, swelling of the face, lips, or tongue  breathing problems  changes in vision  fast, irregular heartbeat  high or low blood pressure  mouth sores  pain, tingling, numbness in the hands or feet  signs of decreased platelets or bleeding - bruising, pinpoint red spots on the skin, black, tarry stools, blood in the urine  signs of decreased  red blood cells - unusually weak or tired, feeling faint or lightheaded, falls  signs of infection - fever or chills, cough, sore throat, pain or difficulty passing urine  signs and symptoms of liver injury like dark yellow or brown urine; general ill feeling or flu-like symptoms; light-colored stools; loss of appetite; nausea; right upper belly pain; unusually weak or tired; yellowing of the eyes or skin  swelling of the ankles, feet, hands  unusually slow heartbeat Side effects that usually do not require medical attention (report to your doctor or health care professional if they continue or are bothersome):  diarrhea  hair loss  loss of appetite  muscle or joint pain  nausea, vomiting  pain, redness, or irritation at site where injected  tiredness This list may not describe all possible side effects. Call your doctor for medical advice about side effects. You may report side effects to FDA at 1-800-FDA-1088. Where should I keep my medicine? This drug is given in a hospital or clinic and will not be stored at home. NOTE: This sheet is a summary. It may not cover all possible information. If you have questions about this medicine, talk to your doctor, pharmacist, or health care provider.  2021 Elsevier/Gold Standard (2018-12-12 13:37:23) }   If you develop nausea and vomiting that is not controlled by your nausea medication, call the clinic.   BELOW ARE SYMPTOMS THAT SHOULD BE REPORTED IMMEDIATELY:  *FEVER GREATER THAN 100.5 F  *CHILLS  WITH OR WITHOUT FEVER  NAUSEA AND VOMITING THAT IS NOT CONTROLLED WITH YOUR NAUSEA MEDICATION  *UNUSUAL SHORTNESS OF BREATH  *UNUSUAL BRUISING OR BLEEDING  TENDERNESS IN MOUTH AND THROAT WITH OR WITHOUT PRESENCE OF ULCERS  *URINARY PROBLEMS  *BOWEL PROBLEMS  UNUSUAL RASH Items with * indicate a potential emergency and should be followed up as soon as possible.  Feel free to call the clinic should you have any questions or  concerns at The clinic phone number is [phone].  Please show the [place] at check-in to the Emergency Department and triage nurse.

## 2020-03-11 NOTE — Patient Instructions (Addendum)
Visit Information  Goals Addressed            This Visit's Progress   . Manage My Medicine       Timeframe:  Long-Range Goal Priority:  High Start Date:         03/05/2020                    Expected End Date:           03/05/2021            Follow Up Date 06/02/2020    - call for medicine refill 2 or 3 days before it runs out - keep a list of all the medicines I take; vitamins and herbals too - use a pillbox to sort medicine - use an alarm clock or phone to remind me to take my medicine    Why is this important?   . These steps will help you keep on track with your medicines.   Notes:     Marland Kitchen Monitor and Manage My Blood Sugar-Diabetes Type 2       Timeframe:  Long-Range Goal Priority:  High Start Date:              03/05/2020               Expected End Date:                03/05/2021       Follow Up Date 06/02/2020    - check blood sugar at prescribed times - check blood sugar if I feel it is too high or too low - take the blood sugar log to all doctor visits    Why is this important?    Checking your blood sugar at home helps to keep it from getting very high or very low.   Writing the results in a diary or log helps the doctor know how to care for you.   Your blood sugar log should have the time, date and the results.   Also, write down the amount of insulin or other medicine that you take.   Other information, like what you ate, exercise done and how you were feeling, will also be helpful.     Notes:     . Track and Manage My Symptoms-Depression       Timeframe:  Long-Range Goal Priority:  High Start Date:       03/05/2020                      Expected End Date:         03/05/2021              Follow Up Date 06/02/2020    - avoid negative self-talk - develop a plan to deal with triggers like holidays, anniversaries - have a plan for how to handle bad days    Why is this important?    Keeping track of your progress will help your treatment team find  the right mix of medicine and therapy for you.   Write in your journal every day.   Day-to-day changes in depression symptoms are normal. It may be more helpful to check your progress at the end of each week instead of every day.     Notes:       Patient Care Plan: CCM Pharmacy Care Plan    Problem Identified: diabetes, hyperlipidemia, hypertension   Priority: High  Onset Date:  03/05/2020    Long-Range Goal: Disease Management   Start Date: 03/05/2020  Expected End Date: 03/05/2021  This Visit's Progress: On track  Priority: High  Note:     Current Barriers:  . Unable to maintain control of blood sugar while on steroids/chemotherapy.    Pharmacist Clinical Goal(s):  Marland Kitchen Over the next 90 days, patient will achieve control of diabetes as evidenced by blood sugar and a1c through collaboration with PharmD and provider.   Interventions: . 1:1 collaboration with [name], MD regarding development and update of comprehensive plan of care as evidenced by provider attestation and co-signature . Inter-disciplinary care team collaboration (see longitudinal plan of care) . Comprehensive medication review performed; medication list updated in electronic medical record  Hypertension (BP goal <130/80) -controlled -Current treatment: . losartan 50 mg daily  -Medications previously tried: none reported  -Current home readings: well controlled when checked at home or office -Current dietary habits: eating what family prepares while on chemotherapy -Current exercise habits: chemotherapy limits exercise and energy -Denies hypotensive/hypertensive symptoms -Educated on BP goals and benefits of medications for prevention of heart attack, stroke and kidney damage; Daily salt intake goal < 2300 mg; -Counseled to monitor BP at home as needed, document, and provide log at future appointments -Counseled on diet and exercise extensively Recommended to continue current  medication  Hyperlipidemia: (LDL goal < 70) -uncontrolled  -Current treatment: . Pravastatin 40 mg daily  -Medications previously tried: none reported  -Current dietary patterns: eating what family provides during chemotherapy -Current exercise habits: limited due to chemo -Educated on Cholesterol goals;  Importance of limiting foods high in cholesterol; Exercise goal of 150 minutes per week; -Counseled on diet and exercise extensively  Diabetes (A1c goal <7%) -uncontrolled -Current medications: . Trulicity 5.44 mg weekly  . Synjardy 12.05/998 mg daily  . Lantus 68 units daily  . Novolog 10 units with meals  (patient increased on her own) -Medications previously tried: none reported  -Current home glucose readings . fasting glucose: 200s . post prandial glucose: elevated -Reports hypoglycemic/hyperglycemic symptoms -Current meal patterns:  . Diet: Patient currently eating what family provides. States that she has days where she can't eat enough due to steroids around chemotherapy. She tries to be careful when she can.  -Current exercise: limited due to chemotherapy -Educated onA1c and blood sugar goals; Exercise goal of 150 minutes per week; Benefits of routine self-monitoring of blood sugar; -Counseled to check feet daily and get yearly eye exams -Counseled on diet and exercise extensively  -Recommend considering increase in [place] - patient agrees and is very stressed about increased blood sugar numbers.   Depression/Anxiety (Goal: manage symptoms) -uncontrolled -Current treatment: . Wellbutrin XL 300 mg daily . Fetzima 80 mg  . Lorazepam 0.5 mg bid prn anxiety -Medications previously tried/failed: none reported -PHQ9: 21 -GAD7: 16 -Educated on Benefits of cognitive-behavioral therapy with or without medication -Recommended to continue current medication Recommended consider speaking with a counselor.    Patient Goals/Self-Care Activities . Over the next 90 days,  patient will:  - take medications as prescribed check glucose three times daily , document, and provide at future appointments  Follow Up Plan: Telephone follow up appointment with care management team member scheduled for: 06/02/2020      The patient verbalized understanding of instructions, educational materials, and care plan provided today and declined offer to receive copy of patient instructions, educational materials, and care plan.   Telephone follow up appointment with pharmacy team member scheduled for: 06/02/2020  Burnice Logan, [facility]  Managing Anxiety, Adult After being diagnosed with an anxiety disorder, you may be relieved to know why you have felt or behaved a certain way. You may also feel overwhelmed about the treatment ahead and what it will mean for your life. With care and support, you can manage this condition and recover from it. How to manage lifestyle changes Managing stress and anxiety Stress is your body's reaction to life changes and events, both good and bad. Most stress will last just a few hours, but stress can be ongoing and can lead to more than just stress. Although stress can play a major role in anxiety, it is not the same as anxiety. Stress is usually caused by something external, such as a deadline, test, or competition. Stress normally passes after the triggering event has ended.  Anxiety is caused by something internal, such as imagining a terrible outcome or worrying that something will go wrong that will devastate you. Anxiety often does not go away even after the triggering event is over, and it can become long-term (chronic) worry. It is important to understand the differences between stress and anxiety and to manage your stress effectively so that it does not lead to an anxious response. Talk with your health care provider or a counselor to learn more about reducing anxiety and stress. He or she may suggest tension reduction techniques, such as:  Music  therapy. This can include creating or listening to music that you enjoy and that inspires you.  Mindfulness-based meditation. This involves being aware of your normal breaths while not trying to control your breathing. It can be done while sitting or walking.  Centering prayer. This involves focusing on a word, phrase, or sacred image that means something to you and brings you peace.  Deep breathing. To do this, expand your stomach and inhale slowly through your nose. Hold your breath for 3-5 seconds. Then exhale slowly, letting your stomach muscles relax.  Self-talk. This involves identifying thought patterns that lead to anxiety reactions and changing those patterns.  Muscle relaxation. This involves tensing muscles and then relaxing them. Choose a tension reduction technique that suits your lifestyle and personality. These techniques take time and practice. Set aside 5-15 minutes a day to do them. Therapists can offer counseling and training in these techniques. The training to help with anxiety may be covered by some insurance plans. Other things you can do to manage stress and anxiety include:  Keeping a stress/anxiety diary. This can help you learn what triggers your reaction and then learn ways to manage your response.  Thinking about how you react to certain situations. You may not be able to control everything, but you can control your response.  Making time for activities that help you relax and not feeling guilty about spending your time in this way.  Visual imagery and yoga can help you stay calm and relax.   Medicines Medicines can help ease symptoms. Medicines for anxiety include:  Anti-anxiety drugs.  Antidepressants. Medicines are often used as a primary treatment for anxiety disorder. Medicines will be prescribed by a health care provider. When used together, medicines, psychotherapy, and tension reduction techniques may be the most effective  treatment. Relationships Relationships can play a big part in helping you recover. Try to spend more time connecting with trusted friends and family members. Consider going to couples counseling, taking family education classes, or going to family therapy. Therapy can help you and others better understand your condition.  How to recognize changes in your anxiety Everyone responds differently to treatment for anxiety. Recovery from anxiety happens when symptoms decrease and stop interfering with your daily activities at home or work. This may mean that you will start to:  Have better concentration and focus. Worry will interfere less in your daily thinking.  Sleep better.  Be less irritable.  Have more energy.  Have improved memory. It is important to recognize when your condition is getting worse. Contact your health care provider if your symptoms interfere with home or work and you feel like your condition is not improving. Follow these instructions at home: Activity  Exercise. Most adults should do the following: ? Exercise for at least 150 minutes each week. The exercise should increase your heart rate and make you sweat (moderate-intensity exercise). ? Strengthening exercises at least twice a week.  Get the right amount and quality of sleep. Most adults need 7-9 hours of sleep each night. Lifestyle  Eat a healthy diet that includes plenty of vegetables, fruits, whole grains, low-fat dairy products, and lean protein. Do not eat a lot of foods that are high in solid fats, added sugars, or salt.  Make choices that simplify your life.  Do not use any products that contain nicotine or tobacco, such as cigarettes, e-cigarettes, and chewing tobacco. If you need help quitting, ask your health care provider.  Avoid caffeine, alcohol, and certain over-the-counter cold medicines. These may make you feel worse. Ask your pharmacist which medicines to avoid.   General instructions  Take  over-the-counter and prescription medicines only as told by your health care provider.  Keep all follow-up visits as told by your health care provider. This is important. Where to find support You can get help and support from these sources:  Self-help groups.  Online and [employer].  A trusted spiritual leader.  Couples counseling.  Family education classes.  Family therapy. Where to find more information You may find that joining a support group helps you deal with your anxiety. The following sources can help you locate counselors or support groups near you:  [place]: www.mentalhealth[place]ica.net  Anxiety and Depression Association of [place] (ADAA): https://www.clark.net/  National Alliance on Mental Illness (NAMI): www.nami.org Contact a health care provider if you:  Have a hard time staying focused or finishing daily tasks.  Spend many hours a day feeling worried about everyday life.  Become exhausted by worry.  Start to have headaches, feel tense, or have nausea.  Urinate more than normal.  Have diarrhea. Get help right away if you have:  A racing heart and shortness of breath.  Thoughts of hurting yourself or others. If you ever feel like you may hurt yourself or others, or have thoughts about taking your own life, get help right away. You can go to your nearest emergency department or call:  Your local emergency services (911 in the [place]  A suicide crisis helpline, such as the [facility] at (208)258-0378. This is open 24 hours a day. Summary  Taking steps to learn and use tension reduction techniques can help calm you and help prevent triggering an anxiety reaction.  When used together, medicines, psychotherapy, and tension reduction techniques may be the most effective treatment.  Family, friends, and partners can play a big part in helping you recover from an anxiety disorder. This information is not  intended to replace advice given to you by your health care provider. Make sure you discuss any questions you have  with your health care provider. Document Revised: 06/12/2018 Document Reviewed: 06/12/2018 Elsevier Patient Education  [place].

## 2020-03-11 NOTE — Progress Notes (Signed)
Please give to Emeterio Reeve.  I could not send directly to her  Pt appears to be on trijardy. Is that correct? Let me know and I can send rx.  Thanks, kc

## 2020-03-11 NOTE — Progress Notes (Unsigned)
Pt is requesting synjardy and stated to Emeterio Reeve that this is what she has been taking. Not trijardy.  [name], [place] 03/11/20 4:40 PM

## 2020-03-16 ENCOUNTER — Other Ambulatory Visit: Payer: Self-pay | Admitting: Hematology and Oncology

## 2020-03-16 ENCOUNTER — Inpatient Hospital Stay: Payer: PPO

## 2020-03-16 ENCOUNTER — Other Ambulatory Visit: Payer: Self-pay

## 2020-03-16 VITALS — BP 92/52 | HR 96 | Temp 98.5°F | Resp 18 | Ht 62.0 in | Wt 191.0 lb

## 2020-03-16 DIAGNOSIS — C50311 Malignant neoplasm of lower-inner quadrant of right female breast: Secondary | ICD-10-CM | POA: Diagnosis not present

## 2020-03-16 DIAGNOSIS — D649 Anemia, unspecified: Secondary | ICD-10-CM | POA: Diagnosis not present

## 2020-03-16 DIAGNOSIS — E860 Dehydration: Secondary | ICD-10-CM

## 2020-03-16 DIAGNOSIS — Z5111 Encounter for antineoplastic chemotherapy: Secondary | ICD-10-CM | POA: Diagnosis not present

## 2020-03-16 LAB — CBC AND DIFFERENTIAL
HCT: 34 — AB (ref 36–46)
Hemoglobin: 11.5 — AB (ref 12.0–16.0)
Neutrophils Absolute: 1.85
Platelets: 140 — AB (ref 150–399)
WBC: 3.3

## 2020-03-16 LAB — HEPATIC FUNCTION PANEL
ALT: 23 (ref 7–35)
AST: 40 — AB (ref 13–35)
Alkaline Phosphatase: 103 (ref 25–125)
Bilirubin, Total: 1

## 2020-03-16 LAB — BASIC METABOLIC PANEL
BUN: 14 (ref 4–21)
CO2: 26 — AB (ref 13–22)
Chloride: 96 — AB (ref 99–108)
Creatinine: 1 (ref 0.5–1.1)
Glucose: 282
Potassium: 3.8 (ref 3.4–5.3)
Sodium: 131 — AB (ref 137–147)

## 2020-03-16 LAB — COMPREHENSIVE METABOLIC PANEL
Albumin: 4 (ref 3.5–5.0)
Calcium: 8.8 (ref 8.7–10.7)

## 2020-03-16 LAB — CBC: RBC: 3.56 — AB (ref 3.87–5.11)

## 2020-03-16 MED ORDER — SODIUM CHLORIDE 0.9 % IV SOLN
Freq: Once | INTRAVENOUS | Status: AC
  Filled 2020-03-16: qty 250

## 2020-03-16 MED ORDER — HEPARIN SOD (PORK) LOCK FLUSH 100 UNIT/ML IV SOLN
500.0000 [IU] | Freq: Once | INTRAVENOUS | Status: AC | PRN
  Administered 2020-03-16: 500 [IU]
  Filled 2020-03-16: qty 5

## 2020-03-16 NOTE — Addendum Note (Signed)
Addended by: Juanetta Beets on: 03/16/2020 11:29 AM   Modules accepted: Orders

## 2020-03-16 NOTE — Addendum Note (Signed)
Addended by: Juanetta Beets on: 03/16/2020 09:58 AM   Modules accepted: Orders

## 2020-03-16 NOTE — Progress Notes (Signed)
Patient vital signs were taken prior to discharge noted BP = 85/48 sitting and pulse = 100. Standing BP = 91/60 pulse = 106. The patient was still having side effects of dizziness when she stood up. Valli Glance, NP called to inform her of the above readings. Dayton Scrape, NP ordered another liter of IVF for today. The patient was informed also and agreed with the above orders.

## 2020-03-16 NOTE — Progress Notes (Signed)
Patient presents to clinic for labs and states she has been sick all weekend with diarrhea. This has now resolved, but she continues to feel weak. Labs unremarkable, we will give 1 liter of normal saline today.

## 2020-03-16 NOTE — Patient Instructions (Signed)
Dehydration, Adult Dehydration is condition in which there is not enough water or other fluids in the body. This happens when a person loses more fluids than he or she takes in. Important body parts cannot work right without the right amount of fluids. Any loss of fluids from the body can cause dehydration. Dehydration can be mild, worse, or very bad. It should be treated right away to keep it from getting very bad. What are the causes? This condition may be caused by:  Conditions that cause loss of water or other fluids, such as: ? Watery poop (diarrhea). ? Vomiting. ? Sweating a lot. ? Peeing (urinating) a lot.  Not drinking enough fluids, especially when you: ? Are ill. ? Are doing things that take a lot of energy to do.  Other illnesses and conditions, such as fever or infection.  Certain medicines, such as medicines that take extra fluid out of the body (diuretics).  Lack of safe drinking water.  Not being able to get enough water and food. What increases the risk? The following factors may make you more likely to develop this condition:  Having a long-term (chronic) illness that has not been treated the right way, such as: ? Diabetes. ? Heart disease. ? Kidney disease.  Being 27 years of age or older.  Having a disability.  Living in a place that is high above the ground or sea (high in altitude). The thinner, dried air causes more fluid loss.  Doing exercises that put stress on your body for a long time. What are the signs or symptoms? Symptoms of dehydration depend on how bad it is. Mild or worse dehydration  Thirst.  Dry lips or dry mouth.  Feeling dizzy or light-headed, especially when you stand up from sitting.  Muscle cramps.  Your body making: ? Dark pee (urine). Pee may be the color of tea. ? Less pee than normal. ? Less tears than normal.  Headache. Very bad dehydration  Changes in skin. Skin may: ? Be cold to the touch (clammy). ? Be blotchy  or pale. ? Not go back to normal right after you lightly pinch it and let it go.  Little or no tears, pee, or sweat.  Changes in vital signs, such as: ? Fast breathing. ? Low blood pressure. ? Weak pulse. ? Pulse that is more than 100 beats a minute when you are sitting still.  Other changes, such as: ? Feeling very thirsty. ? Eyes that look hollow (sunken). ? Cold hands and feet. ? Being mixed up (confused). ? Being very tired (lethargic) or having trouble waking from sleep. ? Short-term weight loss. ? Loss of consciousness. How is this treated? Treatment for this condition depends on how bad it is. Treatment should start right away. Do not wait until your condition gets very bad. Very bad dehydration is an emergency. You will need to go to a hospital.  Mild or worse dehydration can be treated at home. You may be asked to: ? Drink more fluids. ? Drink an oral rehydration solution (ORS). This drink helps get the right amounts of fluids and salts and minerals in the blood (electrolytes).  Very bad dehydration can be treated: ? With fluids through an IV tube. ? By getting normal levels of salts and minerals in your blood. This is often done by giving salts and minerals through a tube. The tube is passed through your nose and into your stomach. ? By treating the root cause. Follow these instructions at  home: Oral rehydration solution If told by your doctor, drink an ORS:  Make an ORS. Use instructions on the package.  Start by drinking small amounts, about  cup (120 mL) every 5-10 minutes.  Slowly drink more until you have had the amount that your doctor said to have. Eating and drinking  Drink enough clear fluid to keep your pee pale yellow. If you were told to drink an ORS, finish the ORS first. Then, start slowly drinking other clear fluids. Drink fluids such as: ? Water. Do not drink only water. Doing that can make the salt (sodium) level in your body get too low. ? Water  from ice chips you suck on. ? Fruit juice that you have added water to (diluted). ? Low-calorie sports drinks.  Eat foods that have the right amounts of salts and minerals, such as: ? Bananas. ? Oranges. ? Potatoes. ? Tomatoes. ? Spinach.  Do not drink alcohol.  Avoid: ? Drinks that have a lot of sugar. These include:  High-calorie sports drinks.  Fruit juice that you did not add water to.  Soda.  Caffeine. ? Foods that are greasy or have a lot of fat or sugar.         General instructions  Take over-the-counter and prescription medicines only as told by your doctor.  Do not take salt tablets. Doing that can make the salt level in your body get too high.  Return to your normal activities as told by your doctor. Ask your doctor what activities are safe for you.  Keep all follow-up visits as told by your doctor. This is important. Contact a doctor if:  You have pain in your belly (abdomen) and the pain: ? Gets worse. ? Stays in one place.  You have a rash.  You have a stiff neck.  You get angry or annoyed (irritable) more easily than normal.  You are more tired or have a harder time waking than normal.  You feel: ? Weak or dizzy. ? Very thirsty. Get help right away if you have:  Any symptoms of very bad dehydration.  Symptoms of vomiting, such as: ? You cannot eat or drink without vomiting. ? Your vomiting gets worse or does not go away. ? Your vomit has blood or green stuff in it.  Symptoms that get worse with treatment.  A fever.  A very bad headache.  Problems with peeing or pooping (having a bowel movement), such as: ? Watery poop that gets worse or does not go away. ? Blood in your poop (stool). This may cause poop to look black and tarry. ? Not peeing in 6-8 hours. ? Peeing only a small amount of very dark pee in 6-8 hours.  Trouble breathing. These symptoms may be an emergency. Do not wait to see if the symptoms will go away. Get  medical help right away. Call your local emergency services (911 in the [place] Do not drive yourself to the hospital. Summary  Dehydration is a condition in which there is not enough water or other fluids in the body. This happens when a person loses more fluids than he or she takes in.  Treatment for this condition depends on how bad it is. Treatment should be started right away. Do not wait until your condition gets very bad.  Drink enough clear fluid to keep your pee pale yellow. If you were told to drink an oral rehydration solution (ORS), finish the ORS first. Then, start slowly drinking other clear fluids.  Take over-the-counter and prescription medicines only as told by your doctor.  Get help right away if you have any symptoms of very bad dehydration. This information is not intended to replace advice given to you by your health care provider. Make sure you discuss any questions you have with your health care provider. Document Revised: 08/23/2018 Document Reviewed: 08/23/2018 Elsevier Patient Education  [place].

## 2020-03-18 ENCOUNTER — Other Ambulatory Visit: Payer: Self-pay

## 2020-03-18 ENCOUNTER — Inpatient Hospital Stay: Payer: PPO

## 2020-03-18 VITALS — BP 128/71 | HR 112 | Temp 98.8°F | Resp 18 | Ht 62.0 in | Wt 200.8 lb

## 2020-03-18 DIAGNOSIS — Z171 Estrogen receptor negative status [ER-]: Secondary | ICD-10-CM

## 2020-03-18 DIAGNOSIS — Z5111 Encounter for antineoplastic chemotherapy: Secondary | ICD-10-CM | POA: Diagnosis not present

## 2020-03-18 DIAGNOSIS — C50311 Malignant neoplasm of lower-inner quadrant of right female breast: Secondary | ICD-10-CM

## 2020-03-18 MED ORDER — HEPARIN SOD (PORK) LOCK FLUSH 100 UNIT/ML IV SOLN
500.0000 [IU] | Freq: Once | INTRAVENOUS | Status: AC | PRN
  Administered 2020-03-18: 500 [IU]
  Filled 2020-03-18: qty 5

## 2020-03-18 MED ORDER — SODIUM CHLORIDE 0.9 % IV SOLN
Freq: Once | INTRAVENOUS | Status: AC
  Filled 2020-03-18: qty 250

## 2020-03-18 MED ORDER — DEXAMETHASONE SODIUM PHOSPHATE 10 MG/ML IJ SOLN
8.0000 mg | Freq: Once | INTRAMUSCULAR | Status: AC
  Administered 2020-03-18: 8 mg via INTRAVENOUS

## 2020-03-18 MED ORDER — SODIUM CHLORIDE 0.9 % IV SOLN
80.0000 mg/m2 | Freq: Once | INTRAVENOUS | Status: AC
  Administered 2020-03-18: 162 mg via INTRAVENOUS
  Filled 2020-03-18: qty 27

## 2020-03-18 MED ORDER — DIPHENHYDRAMINE HCL 50 MG/ML IJ SOLN
INTRAMUSCULAR | Status: AC
  Filled 2020-03-18: qty 1

## 2020-03-18 MED ORDER — DIPHENHYDRAMINE HCL 50 MG/ML IJ SOLN
50.0000 mg | Freq: Once | INTRAMUSCULAR | Status: AC
  Administered 2020-03-18: 50 mg via INTRAVENOUS

## 2020-03-18 MED ORDER — DEXAMETHASONE SODIUM PHOSPHATE 10 MG/ML IJ SOLN
INTRAMUSCULAR | Status: AC
  Filled 2020-03-18: qty 1

## 2020-03-18 MED ORDER — FAMOTIDINE IN NACL 20-0.9 MG/50ML-% IV SOLN
INTRAVENOUS | Status: AC
  Filled 2020-03-18: qty 50

## 2020-03-18 MED ORDER — FAMOTIDINE IN NACL 20-0.9 MG/50ML-% IV SOLN
20.0000 mg | Freq: Once | INTRAVENOUS | Status: AC
  Administered 2020-03-18: 20 mg via INTRAVENOUS

## 2020-03-18 NOTE — Patient Instructions (Signed)
[facility] Discharge Instructions for Patients Receiving Chemotherapy  Today you received the following chemotherapy agents Paclitaxel.  To help prevent nausea and vomiting after your treatment, we encourage you to take your nausea medication.  Paclitaxel injection What is this medicine? PACLITAXEL (PAK li TAX el) is a chemotherapy drug. It targets fast dividing cells, like cancer cells, and causes these cells to die. This medicine is used to treat ovarian cancer, breast cancer, lung cancer, Kaposi's sarcoma, and other cancers. This medicine may be used for other purposes; ask your health care provider or pharmacist if you have questions. COMMON BRAND NAME(S): Onxol, Taxol What should I tell my health care provider before I take this medicine? They need to know if you have any of these conditions:  history of irregular heartbeat  liver disease  low blood counts, like low white cell, platelet, or red cell counts  lung or breathing disease, like asthma  tingling of the fingers or toes, or other nerve disorder  an unusual or allergic reaction to paclitaxel, alcohol, polyoxyethylated castor oil, other chemotherapy, other medicines, foods, dyes, or preservatives  pregnant or trying to get pregnant  breast-feeding How should I use this medicine? This drug is given as an infusion into a vein. It is administered in a hospital or clinic by a specially trained health care professional. Talk to your pediatrician regarding the use of this medicine in children. Special care may be needed. Overdosage: If you think you have taken too much of this medicine contact a poison control center or emergency room at once. NOTE: This medicine is only for you. Do not share this medicine with others. What if I miss a dose? It is important not to miss your dose. Call your doctor or health care professional if you are unable to keep an appointment. What may interact with this  medicine? Do not take this medicine with any of the following medications:  live virus vaccines This medicine may also interact with the following medications:  antiviral medicines for hepatitis, HIV or AIDS  certain antibiotics like erythromycin and clarithromycin  certain medicines for fungal infections like ketoconazole and itraconazole  certain medicines for seizures like carbamazepine, phenobarbital, phenytoin  gemfibrozil  nefazodone  rifampin  St. John's wort This list may not describe all possible interactions. Give your health care provider a list of all the medicines, herbs, non-prescription drugs, or dietary supplements you use. Also tell them if you smoke, drink alcohol, or use illegal drugs. Some items may interact with your medicine. What should I watch for while using this medicine? Your condition will be monitored carefully while you are receiving this medicine. You will need important blood work done while you are taking this medicine. This medicine can cause serious allergic reactions. To reduce your risk you will need to take other medicine(s) before treatment with this medicine. If you experience allergic reactions like skin rash, itching or hives, swelling of the face, lips, or tongue, tell your doctor or health care professional right away. In some cases, you may be given additional medicines to help with side effects. Follow all directions for their use. This drug may make you feel generally unwell. This is not uncommon, as chemotherapy can affect healthy cells as well as cancer cells. Report any side effects. Continue your course of treatment even though you feel ill unless your doctor tells you to stop. Call your doctor or health care professional for advice if you get a fever, chills or  sore throat, or other symptoms of a cold or flu. Do not treat yourself. This drug decreases your body's ability to fight infections. Try to avoid being around people who are  sick. This medicine may increase your risk to bruise or bleed. Call your doctor or health care professional if you notice any unusual bleeding. Be careful brushing and flossing your teeth or using a toothpick because you may get an infection or bleed more easily. If you have any dental work done, tell your dentist you are receiving this medicine. Avoid taking products that contain aspirin, acetaminophen, ibuprofen, naproxen, or ketoprofen unless instructed by your doctor. These medicines may hide a fever. Do not become pregnant while taking this medicine. Women should inform their doctor if they wish to become pregnant or think they might be pregnant. There is a potential for serious side effects to an unborn child. Talk to your health care professional or pharmacist for more information. Do not breast-feed an infant while taking this medicine. Men are advised not to father a child while receiving this medicine. This product may contain alcohol. Ask your pharmacist or healthcare provider if this medicine contains alcohol. Be sure to tell all healthcare providers you are taking this medicine. Certain medicines, like metronidazole and disulfiram, can cause an unpleasant reaction when taken with alcohol. The reaction includes flushing, headache, nausea, vomiting, sweating, and increased thirst. The reaction can last from 30 minutes to several hours. What side effects may I notice from receiving this medicine? Side effects that you should report to your doctor or health care professional as soon as possible:  allergic reactions like skin rash, itching or hives, swelling of the face, lips, or tongue  breathing problems  changes in vision  fast, irregular heartbeat  high or low blood pressure  mouth sores  pain, tingling, numbness in the hands or feet  signs of decreased platelets or bleeding - bruising, pinpoint red spots on the skin, black, tarry stools, blood in the urine  signs of decreased  red blood cells - unusually weak or tired, feeling faint or lightheaded, falls  signs of infection - fever or chills, cough, sore throat, pain or difficulty passing urine  signs and symptoms of liver injury like dark yellow or brown urine; general ill feeling or flu-like symptoms; light-colored stools; loss of appetite; nausea; right upper belly pain; unusually weak or tired; yellowing of the eyes or skin  swelling of the ankles, feet, hands  unusually slow heartbeat Side effects that usually do not require medical attention (report to your doctor or health care professional if they continue or are bothersome):  diarrhea  hair loss  loss of appetite  muscle or joint pain  nausea, vomiting  pain, redness, or irritation at site where injected  tiredness This list may not describe all possible side effects. Call your doctor for medical advice about side effects. You may report side effects to FDA at 1-800-FDA-1088. Where should I keep my medicine? This drug is given in a hospital or clinic and will not be stored at home. NOTE: This sheet is a summary. It may not cover all possible information. If you have questions about this medicine, talk to your doctor, pharmacist, or health care provider.  2021 Elsevier/Gold Standard (2018-12-12 13:37:23)    If you develop nausea and vomiting that is not controlled by your nausea medication, call the clinic.   BELOW ARE SYMPTOMS THAT SHOULD BE REPORTED IMMEDIATELY:  *FEVER GREATER THAN 100.5 F  *CHILLS WITH OR  WITHOUT FEVER  NAUSEA AND VOMITING THAT IS NOT CONTROLLED WITH YOUR NAUSEA MEDICATION  *UNUSUAL SHORTNESS OF BREATH  *UNUSUAL BRUISING OR BLEEDING  TENDERNESS IN MOUTH AND THROAT WITH OR WITHOUT PRESENCE OF ULCERS  *URINARY PROBLEMS  *BOWEL PROBLEMS  UNUSUAL RASH Items with * indicate a potential emergency and should be followed up as soon as possible.  Feel free to call the clinic should you have any questions or  concerns at The clinic phone number is [phone].  Please show the [facility] at check-in to the Emergency Department and triage nurse.

## 2020-03-19 ENCOUNTER — Other Ambulatory Visit: Payer: Self-pay | Admitting: Family Medicine

## 2020-03-19 ENCOUNTER — Telehealth: Payer: Self-pay

## 2020-03-19 DIAGNOSIS — Z1501 Genetic susceptibility to malignant neoplasm of breast: Secondary | ICD-10-CM

## 2020-03-19 NOTE — Progress Notes (Signed)
Chronic Care Management Pharmacy Assistant   Name: Linda [name]  MRN: [id number] DOB: 03/16/1970  Reason for Encounter: Adherence  PCP : [name], MD  Allergies:   Allergies  Allergen Reactions  . Codeine Shortness Of Breath  . Celecoxib Other (See Comments)    Unknown reaction  . Ezetimibe-Simvastatin Other (See Comments)    Unknown reaction  . Propranolol Hcl Other (See Comments)    Unknown reaction    Medications: Outpatient Encounter Medications as of 03/19/2020  Medication Sig Note  . benzonatate (TESSALON) 100 MG capsule Take 1 capsule (100 mg total) by mouth 3 (three) times daily as needed for cough.   . Blood Glucose Monitoring Suppl (ONETOUCH VERIO REFLECT) w/Device KIT AS DIRECTED   . buPROPion (WELLBUTRIN XL) 300 MG 24 hr tablet TAKE ONE TABLET BY MOUTH EVERY EVENING   . calcium citrate-vitamin D (CITRACAL+D) 315-200 MG-UNIT tablet Take 1 tablet by mouth 2 (two) times daily.   . clotrimazole-betamethasone (LOTRISONE) cream Apply small amount to affected area twice daily (Patient taking differently: as needed. Apply small amount to affected area twice daily)   . Continuous Blood Gluc Receiver (FREESTYLE LIBRE 2 READER) DEVI E11.69 Check blood sugar 4 times daily as directed   . Continuous Blood Gluc Sensor (FREESTYLE LIBRE 2 SENSOR) MISC E11.69 Change sensor every 14 days as directed   . cyclobenzaprine (FLEXERIL) 10 MG tablet TAKE ONE TABLET BY MOUTH EVERY 8 HOURS AS NEEDED FOR MUSCLE SPASMS   . dexamethasone (DECADRON) 4 MG tablet Take 4 mg by mouth 2 (two) times daily with a meal.   . dicyclomine (BENTYL) 20 MG tablet TAKE ONE TABLET BY MOUTH BEFORE MEALS AND AT BEDTIME AS NEEDED FOR STOMACH CRAMPING   . Dulaglutide (TRULICITY) 5.03 UU/8.2CM SOPN INJECT ONE SYRINGE ONCE A WEEK   . Empagliflozin-metFORMIN HCl (SYNJARDY) 12.05-998 MG TABS Take 1 tablet by mouth 2 (two) times daily.   . famotidine (PEPCID) 20 MG tablet TAKE ONE TABLET BY MOUTH TWICE DAILY    . ferrous sulfate 325 (65 FE) MG tablet Take 325 mg by mouth every evening.   Marland Kitchen FETZIMA 80 MG CP24 TAKE ONE CAPSULE BY MOUTH ONCE DAILY   . gabapentin (NEURONTIN) 300 MG capsule Take 1 capsule (300 mg total) by mouth 3 (three) times daily.   Marland Kitchen glucose blood test strip 1 each by Other route in the morning and at bedtime. One Touch Verio - Patient checks blood sugar 2-3 times daily.   . insulin aspart (NOVOLOG FLEXPEN) 100 UNIT/ML FlexPen Three times before meals while on prednisone.   . Insulin Pen Needle (BD PEN NEEDLE NANO U/F) 32G X 4 MM MISC Inject 1 each into the skin in the morning and at bedtime.   Marland Kitchen LANTUS SOLOSTAR 100 UNIT/ML Solostar Pen INJECT 68 UNITS SUBCUTANEOUSLY EVERYDAY AT BEDTIME   . levothyroxine (SYNTHROID) 75 MCG tablet TAKE ONE TABLET BY MOUTH ONCE DAILY   . loratadine (CLARITIN) 10 MG tablet Take 10 mg by mouth daily. Taking 3-4 days each week due to bone pain associated with shot from cancer center.   Marland Kitchen LORazepam (ATIVAN) 0.5 MG tablet TAKE ONE TABLET BY MOUTH TWICE DAILY AS NEEDED FOR ANXIETY   . losartan (COZAAR) 50 MG tablet TAKE ONE TABLET BY MOUTH EVERY EVENING   . morphine (MS CONTIN) 30 MG 12 hr tablet Take 1 tablet (30 mg total) by mouth every 12 (twelve) hours.   . Multiple Vitamin (MULTIVITAMIN WITH MINERALS) TABS tablet Take 1 tablet  by mouth daily.  06/08/2018: Pt plans on purchasing again when she can get out.  . ondansetron (ZOFRAN) 4 MG tablet Take 1 tablet (4 mg total) by mouth every 4 (four) hours as needed for nausea.   Glory Rosebush Delica Lancets 81L MISC 1 each by Does not apply route daily before breakfast. Check blood sugar twice daily.   . pantoprazole (PROTONIX) 40 MG tablet TAKE ONE TABLET BY MOUTH TWICE DAILY   . Pegfilgrastim-jmdb (FULPHILA [place]) Inject into the skin.   . potassium chloride (MICRO-K) 10 MEQ CR capsule Take by mouth.   . pravastatin (PRAVACHOL) 20 MG tablet Take 20 mg by mouth at bedtime.   . prochlorperazine (COMPAZINE) 10 MG tablet  Take 1 tablet (10 mg total) by mouth every 6 (six) hours as needed for nausea or vomiting.   . senna (SENOKOT) 8.6 MG tablet Take 1 tablet by mouth daily as needed for constipation.   . SODIUM FLUORIDE 5000 SENSITIVE 1.1-5 % GEL Take by mouth 2 (two) times daily.   Marland Kitchen Specialty Vitamins Products (MAGNESIUM, AMINO ACID CHELATE,) 133 MG tablet Take 1 tablet by mouth at bedtime.   . sucralfate (CARAFATE) 1 g tablet Take 1 tablet (1 g total) by mouth 4 (four) times daily -  with meals and at bedtime.   . Vitamin D, Ergocalciferol, (DRISDOL) 1.25 MG (50000 UNIT) CAPS capsule TAKE ONE CAPSULE BY MOUTH ONCE WEEKLY ON FRIDAY   . zolpidem (AMBIEN) 10 MG tablet TAKE ONE TABLET BY MOUTH EVERYDAY AT BEDTIME AS NEEDED    No facility-administered encounter medications on file as of 03/19/2020.    Current Diagnosis: Patient Active Problem List   Diagnosis Date Noted  . Dehydration 03/16/2020  . Hypokalemia 03/09/2020  . Chemotherapy-induced thrombocytopenia 02/06/2020  . Thrombocytopenia, unspecified ([place]) 01/27/2020  . Malignant neoplasm of lower-inner quadrant of right breast of female, estrogen receptor negative ([place]) 10/01/2019  . Chronic pain syndrome 07/08/2019  . Memory loss 07/08/2019  . Depression, major, recurrent, mild ([place]) 07/08/2019  . Uncomplicated opioid dependence ([place]) 07/08/2019  . Mixed hyperlipidemia 04/11/2019  . Dyslipidemia associated with type 2 diabetes mellitus ([place]) 04/11/2019  . Essential hypertension, benign 04/11/2019  . Major depressive disorder, single episode, mild ([place]) 04/11/2019  . Acquired hypothyroidism 04/11/2019  . Vitamin D insufficiency 04/11/2019  . Class 2 severe obesity due to excess calories with serious comorbidity and body mass index (BMI) of 35.0 to 35.9 in adult ([place]) 04/11/2019  . Pre-ulcerative calluses 03/04/2019  . Liver cirrhosis secondary to NASH (nonalcoholic steatohepatitis) ([place]) 09/13/2018    Class: Chronic  . BRCA1 positive 06/14/2016    Chart review noted the following changes, Dexcom was discontinued on 03/11/20,  Empagliflozin-metFORMIN HCl (SYNJARDY) 12.05-998 MG TABS 180 tablet 0 03/11/2020   Take 1 tablet by mouth 2 (two) times daily. - Oral    Adherence Gap Total Gaps - Star Measures 1 ? Breast Cancer Screening (BCS) 0 ? Controlling High Blood Pressure (CBP) 0 ? Diabetes: HbA1c Control <= 9.0% (CDC-A1c) Met: A1c less than 9 ? Diabetes: Eye exam (retinal) performed (CDC-Eye) Performed: From EHR Data - need MD and result documented ? Diabetes: Medical attention for nephropathy (CDC-Neph) Met: Medical Attention for Nephropathy Documented ? Care for Older Adults (COA): Medication Review 0 ? Care for Older Adults (COA): Pain Assessment 0 ? Colorectal Cancer Screening (COL) Met: Colorectal Cancer Screening Compliant ? Medication Adherence for Cholesterol (Statins) (MAC) Met: Med Compliance CHOL:  90-99% ? Medication Adherence for Diabetes Medications (MAD) 0 ?  Medication Adherence for Hypertension (RAS antogoniststs) [facility]) Met: Med Compliance HTN:  100% ? Medication Reconciliation Post-Discharge (MRP) 0 ? Osteoporosis Management in Women Who Had a Fracture (OMW) 0 ? Plan All-Cause Readmissions (PCR) 0 ? Statin Therapy for Patients with Cardiovascular Disease (SPC) 0 ? Statin Use in Persons with Diabetes (SUPD) 0 ? Statin Use in Persons with Diabetes (SUPD) 0 Visits: Wellness [facility]) Not Met: AWV not performed Visits: Annual Wellness Visit (AWV) Not Met: AWV not performed Controlling High Blood Pressure (CBP) Met: BP < 140/90 Diabetes: HbA1c Testing (CDC-A1c Testing) Met: A1c Testing Documented  Follow-Up:  Pharmacist Review  Donette Larry, [place], [place] Pharmacist Assistant [phone]

## 2020-03-23 ENCOUNTER — Other Ambulatory Visit: Payer: Self-pay | Admitting: Hematology and Oncology

## 2020-03-23 ENCOUNTER — Inpatient Hospital Stay: Payer: PPO

## 2020-03-23 ENCOUNTER — Other Ambulatory Visit: Payer: Self-pay | Admitting: Oncology

## 2020-03-23 DIAGNOSIS — D649 Anemia, unspecified: Secondary | ICD-10-CM | POA: Diagnosis not present

## 2020-03-23 DIAGNOSIS — C50311 Malignant neoplasm of lower-inner quadrant of right female breast: Secondary | ICD-10-CM | POA: Diagnosis not present

## 2020-03-23 LAB — CBC AND DIFFERENTIAL
HCT: 30 — AB (ref 36–46)
Hemoglobin: 9.8 — AB (ref 12.0–16.0)
Neutrophils Absolute: 1.22
Platelets: 73 — AB (ref 150–399)
WBC: 2

## 2020-03-23 LAB — HEPATIC FUNCTION PANEL
ALT: 22 (ref 7–35)
AST: 26 (ref 13–35)
Alkaline Phosphatase: 81 (ref 25–125)
Bilirubin, Total: 0.8

## 2020-03-23 LAB — BASIC METABOLIC PANEL
BUN: 4 (ref 4–21)
CO2: 24 — AB (ref 13–22)
Chloride: 106 (ref 99–108)
Creatinine: 0.6 (ref 0.5–1.1)
Glucose: 262
Potassium: 3.7 (ref 3.4–5.3)
Sodium: 136 — AB (ref 137–147)

## 2020-03-23 LAB — COMPREHENSIVE METABOLIC PANEL
Albumin: 3.6 (ref 3.5–5.0)
Calcium: 8.7 (ref 8.7–10.7)

## 2020-03-23 LAB — CBC: RBC: 3.02 — AB (ref 3.87–5.11)

## 2020-03-25 ENCOUNTER — Inpatient Hospital Stay: Payer: PPO | Attending: Oncology

## 2020-03-25 ENCOUNTER — Other Ambulatory Visit: Payer: Self-pay

## 2020-03-25 ENCOUNTER — Other Ambulatory Visit: Payer: Self-pay | Admitting: Hematology and Oncology

## 2020-03-25 VITALS — BP 150/67 | HR 97 | Temp 97.7°F | Resp 18 | Ht 62.0 in | Wt 197.0 lb

## 2020-03-25 DIAGNOSIS — Z171 Estrogen receptor negative status [ER-]: Secondary | ICD-10-CM

## 2020-03-25 DIAGNOSIS — C50811 Malignant neoplasm of overlapping sites of right female breast: Secondary | ICD-10-CM | POA: Diagnosis not present

## 2020-03-25 DIAGNOSIS — Z79899 Other long term (current) drug therapy: Secondary | ICD-10-CM | POA: Diagnosis not present

## 2020-03-25 DIAGNOSIS — Z5111 Encounter for antineoplastic chemotherapy: Secondary | ICD-10-CM | POA: Insufficient documentation

## 2020-03-25 DIAGNOSIS — C50311 Malignant neoplasm of lower-inner quadrant of right female breast: Secondary | ICD-10-CM | POA: Diagnosis not present

## 2020-03-25 MED ORDER — DIPHENHYDRAMINE HCL 50 MG/ML IJ SOLN
50.0000 mg | Freq: Once | INTRAMUSCULAR | Status: AC
  Administered 2020-03-25: 50 mg via INTRAVENOUS

## 2020-03-25 MED ORDER — FAMOTIDINE IN NACL 20-0.9 MG/50ML-% IV SOLN
20.0000 mg | Freq: Once | INTRAVENOUS | Status: AC
  Administered 2020-03-25: 20 mg via INTRAVENOUS

## 2020-03-25 MED ORDER — DEXAMETHASONE SODIUM PHOSPHATE 10 MG/ML IJ SOLN
INTRAMUSCULAR | Status: AC
  Filled 2020-03-25: qty 1

## 2020-03-25 MED ORDER — DEXAMETHASONE SODIUM PHOSPHATE 10 MG/ML IJ SOLN
8.0000 mg | Freq: Once | INTRAMUSCULAR | Status: AC
  Administered 2020-03-25: 8 mg via INTRAVENOUS

## 2020-03-25 MED ORDER — HEPARIN SOD (PORK) LOCK FLUSH 100 UNIT/ML IV SOLN
500.0000 [IU] | Freq: Once | INTRAVENOUS | Status: AC | PRN
  Administered 2020-03-25: 500 [IU]
  Filled 2020-03-25: qty 5

## 2020-03-25 MED ORDER — SODIUM CHLORIDE 0.9 % IV SOLN
80.0000 mg/m2 | Freq: Once | INTRAVENOUS | Status: AC
  Administered 2020-03-25: 162 mg via INTRAVENOUS
  Filled 2020-03-25: qty 27

## 2020-03-25 MED ORDER — FAMOTIDINE IN NACL 20-0.9 MG/50ML-% IV SOLN
INTRAVENOUS | Status: AC
  Filled 2020-03-25: qty 50

## 2020-03-25 MED ORDER — DIPHENHYDRAMINE HCL 50 MG/ML IJ SOLN
INTRAMUSCULAR | Status: AC
  Filled 2020-03-25: qty 1

## 2020-03-25 MED ORDER — ONDANSETRON HCL 4 MG/2ML IJ SOLN
INTRAMUSCULAR | Status: AC
  Filled 2020-03-25: qty 4

## 2020-03-25 MED ORDER — SODIUM CHLORIDE 0.9 % IV SOLN
Freq: Once | INTRAVENOUS | Status: AC
  Filled 2020-03-25: qty 250

## 2020-03-25 MED ORDER — ONDANSETRON HCL 4 MG/2ML IJ SOLN
8.0000 mg | Freq: Once | INTRAMUSCULAR | Status: AC
  Administered 2020-03-25: 8 mg via INTRAVENOUS

## 2020-03-25 NOTE — Progress Notes (Signed)
[id number] patient stated she did not take her zofran this AM, contacted Kelli, [facility] and she gave order for zofran 25m IV before treatment.

## 2020-03-25 NOTE — Patient Instructions (Signed)
[facility] Discharge Instructions for Patients Receiving Chemotherapy  Today you received the following chemotherapy agents Paclitaxel  To help prevent nausea and vomiting after your treatment, we encourage you to take your nausea medication as directed   If you develop nausea and vomiting that is not controlled by your nausea medication, call the clinic.   BELOW ARE SYMPTOMS THAT SHOULD BE REPORTED IMMEDIATELY:  *FEVER GREATER THAN 100.5 F  *CHILLS WITH OR WITHOUT FEVER  NAUSEA AND VOMITING THAT IS NOT CONTROLLED WITH YOUR NAUSEA MEDICATION  *UNUSUAL SHORTNESS OF BREATH  *UNUSUAL BRUISING OR BLEEDING  TENDERNESS IN MOUTH AND THROAT WITH OR WITHOUT PRESENCE OF ULCERS  *URINARY PROBLEMS  *BOWEL PROBLEMS  UNUSUAL RASH Items with * indicate a potential emergency and should be followed up as soon as possible.  Feel free to call the clinic should you have any questions or concerns at The clinic phone number is [phone].  Please show the [place] at check-in to the Emergency Department and triage nurse.  Paclitaxel injection What is this medicine? PACLITAXEL (PAK li TAX el) is a chemotherapy drug. It targets fast dividing cells, like cancer cells, and causes these cells to die. This medicine is used to treat ovarian cancer, breast cancer, lung cancer, Kaposi's sarcoma, and other cancers. This medicine may be used for other purposes; ask your health care provider or pharmacist if you have questions. COMMON BRAND NAME(S): Onxol, Taxol What should I tell my health care provider before I take this medicine? They need to know if you have any of these conditions:  history of irregular heartbeat  liver disease  low blood counts, like low white cell, platelet, or red cell counts  lung or breathing disease, like asthma  tingling of the fingers or toes, or other nerve disorder  an unusual or allergic reaction to paclitaxel, alcohol,  polyoxyethylated castor oil, other chemotherapy, other medicines, foods, dyes, or preservatives  pregnant or trying to get pregnant  breast-feeding How should I use this medicine? This drug is given as an infusion into a vein. It is administered in a hospital or clinic by a specially trained health care professional. Talk to your pediatrician regarding the use of this medicine in children. Special care may be needed. Overdosage: If you think you have taken too much of this medicine contact a poison control center or emergency room at once. NOTE: This medicine is only for you. Do not share this medicine with others. What if I miss a dose? It is important not to miss your dose. Call your doctor or health care professional if you are unable to keep an appointment. What may interact with this medicine? Do not take this medicine with any of the following medications:  live virus vaccines This medicine may also interact with the following medications:  antiviral medicines for hepatitis, HIV or AIDS  certain antibiotics like erythromycin and clarithromycin  certain medicines for fungal infections like ketoconazole and itraconazole  certain medicines for seizures like carbamazepine, phenobarbital, phenytoin  gemfibrozil  nefazodone  rifampin  St. John's wort This list may not describe all possible interactions. Give your health care provider a list of all the medicines, herbs, non-prescription drugs, or dietary supplements you use. Also tell them if you smoke, drink alcohol, or use illegal drugs. Some items may interact with your medicine. What should I watch for while using this medicine? Your condition will be monitored carefully while you are receiving this medicine. You will need  important blood work done while you are taking this medicine. This medicine can cause serious allergic reactions. To reduce your risk you will need to take other medicine(s) before treatment with this  medicine. If you experience allergic reactions like skin rash, itching or hives, swelling of the face, lips, or tongue, tell your doctor or health care professional right away. In some cases, you may be given additional medicines to help with side effects. Follow all directions for their use. This drug may make you feel generally unwell. This is not uncommon, as chemotherapy can affect healthy cells as well as cancer cells. Report any side effects. Continue your course of treatment even though you feel ill unless your doctor tells you to stop. Call your doctor or health care professional for advice if you get a fever, chills or sore throat, or other symptoms of a cold or flu. Do not treat yourself. This drug decreases your body's ability to fight infections. Try to avoid being around people who are sick. This medicine may increase your risk to bruise or bleed. Call your doctor or health care professional if you notice any unusual bleeding. Be careful brushing and flossing your teeth or using a toothpick because you may get an infection or bleed more easily. If you have any dental work done, tell your dentist you are receiving this medicine. Avoid taking products that contain aspirin, acetaminophen, ibuprofen, naproxen, or ketoprofen unless instructed by your doctor. These medicines may hide a fever. Do not become pregnant while taking this medicine. Women should inform their doctor if they wish to become pregnant or think they might be pregnant. There is a potential for serious side effects to an unborn child. Talk to your health care professional or pharmacist for more information. Do not breast-feed an infant while taking this medicine. Men are advised not to father a child while receiving this medicine. This product may contain alcohol. Ask your pharmacist or healthcare provider if this medicine contains alcohol. Be sure to tell all healthcare providers you are taking this medicine. Certain medicines,  like metronidazole and disulfiram, can cause an unpleasant reaction when taken with alcohol. The reaction includes flushing, headache, nausea, vomiting, sweating, and increased thirst. The reaction can last from 30 minutes to several hours. What side effects may I notice from receiving this medicine? Side effects that you should report to your doctor or health care professional as soon as possible:  allergic reactions like skin rash, itching or hives, swelling of the face, lips, or tongue  breathing problems  changes in vision  fast, irregular heartbeat  high or low blood pressure  mouth sores  pain, tingling, numbness in the hands or feet  signs of decreased platelets or bleeding - bruising, pinpoint red spots on the skin, black, tarry stools, blood in the urine  signs of decreased red blood cells - unusually weak or tired, feeling faint or lightheaded, falls  signs of infection - fever or chills, cough, sore throat, pain or difficulty passing urine  signs and symptoms of liver injury like dark yellow or brown urine; general ill feeling or flu-like symptoms; light-colored stools; loss of appetite; nausea; right upper belly pain; unusually weak or tired; yellowing of the eyes or skin  swelling of the ankles, feet, hands  unusually slow heartbeat Side effects that usually do not require medical attention (report to your doctor or health care professional if they continue or are bothersome):  diarrhea  hair loss  loss of appetite  muscle or  joint pain  nausea, vomiting  pain, redness, or irritation at site where injected  tiredness This list may not describe all possible side effects. Call your doctor for medical advice about side effects. You may report side effects to FDA at 1-800-FDA-1088. Where should I keep my medicine? This drug is given in a hospital or clinic and will not be stored at home. NOTE: This sheet is a summary. It may not cover all possible information.  If you have questions about this medicine, talk to your doctor, pharmacist, or health care provider.  2021 Elsevier/Gold Standard (2018-12-12 13:37:23)

## 2020-03-27 DIAGNOSIS — L97522 Non-pressure chronic ulcer of other part of left foot with fat layer exposed: Secondary | ICD-10-CM

## 2020-03-27 DIAGNOSIS — E1142 Type 2 diabetes mellitus with diabetic polyneuropathy: Secondary | ICD-10-CM | POA: Diagnosis not present

## 2020-03-27 DIAGNOSIS — E11621 Type 2 diabetes mellitus with foot ulcer: Secondary | ICD-10-CM

## 2020-03-27 DIAGNOSIS — L608 Other nail disorders: Secondary | ICD-10-CM | POA: Diagnosis not present

## 2020-03-27 HISTORY — DX: Type 2 diabetes mellitus with foot ulcer: L97.522

## 2020-03-27 HISTORY — DX: Type 2 diabetes mellitus with foot ulcer: E11.621

## 2020-03-27 NOTE — Progress Notes (Signed)
Linda [name]  [address] [place],  [place]  [address] [phone]  Clinic Day:  03/30/2020  Referring physician: [name], MD  This document serves as a record of services personally performed by [name], MD. It was created on their behalf by [facility], a trained medical scribe. The creation of this record is based on the scribe's personal observations and the provider's statements to them.  CHIEF COMPLAINT:  CC: Stage IB triple negative breast cancer, status post 4 cycles of Adriamycin/cyclophosphamide  Current Treatment:   Weekly paclitaxel for 12 cycles   HISTORY OF PRESENT ILLNESS:  Linda [name] is a 50 y.o. female who we began seeing in February 2018 for evaluation of anemia.  Her anemia was felt to be secondary to iron deficiency and we recommended continuation of oral iron supplementation for a total of 6 months, as well as referral back to the gastroenterologist.  The patient also has a history of thrombocytopenia and had previously seen Dr. Bobby Rumpf in 2014.  The thrombocytopenia was felt to be secondary to mild chronic immune thrombocytopenic purpura (ITP).  Due to the patient's family history of malignancy, she underwent testing for hereditary cancer syndromes with the Myriad myRisk Hereditary Cancer Panel test.  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  She underwent a robotic hysterectomy and bilateral salpingo-oophorectomy in May 2018 with Dr. Everitt Amber.  Pathology was benign.  We also discussed the option of risk reducing bilateral mastectomy, but she has chosen to have close surveillance, so we recommended annual MRI breast, in addition to mammography.  She was placed on chemoprevention with raloxifene in September 2018.  CT abdomen and pelvis in August 2020 done for bilateral flank pain,  revealed a tiny left renal calculus, as well as probable hepatic cirrhosis without evidence of hepatic mass.  She was then referred to [place] and is followed by Roosevelt Locks, CRNP in [place] for her liver cirrhosis.  She tested negative for hepatitis A, B, and C.  She received her hepatitis vaccines in 2020.  We saw her in September 2021 for a new diagnosis of stage IB (T1c N0 M0) triple negative right breast cancer.  She underwent screening bilateral mammogram on August 3rd which revealed possible masses in the right breast.  Diagnostic right mammogram and right breast ultrasound from August 18th confirmed suspicious masses in the right breast at 5 o'clock measuring 1.5 cm and 6 o'clock measuring 1.4 cm.  There was an indeterminate 4 mm with possible distortion in the outer right breast without sonographic correlate.  Right axillary ultrasound was normal.  She then underwent biopsies of both masses and surgical pathology from these procedures revealed  invasive ductal carcinoma, grade 3, with necrosis at 5 o'clock; and invasive ductal carcinoma, grade 1-2, with necrosis and focal myxoid change at 6 o'clock.  No DCIS was identified.  Breast prognostic profiles revealed HER2, and estrogen and progesterone receptors to be negative. Ki67 was 30% at the 5 o'clock mass, and 40% at 6 o'clock.  She underwent bilateral mastectomies on September 24th with Dr. Noberto Retort. Surgical pathology from this procedure revealed invasive ductal carcinoma, grade 3, with myxoid change, 19 mm, at 6 o'clock, and invasive ductal carcinoma, grade 3, and ductal carcinoma in situ, 19 mm, at 5 o'clock.  All margins were negative for invasive carcinoma or DCIS.  Two sentinel lymph nodes were negative for metastatic carcinoma (0/2). CT chest, abdomen and pelvis in September did not reveal any evidence of metastatic disease.  She started dose dense AC chemotherapy on October 25th , with plans to follow this with  weekly paclitaxel for 12 doses.  She had severe restless leg syndrome with oral corticosteroids.  At her visit on November 29th prior to a 3rd cycle of AC chemotherapy she had significantly worsened anemia with a hemoglobin of 8.7, so we decided to wait 3 weeks for her 4th cycle of chemotherapy.  Due to cytopenias, this had to be delayed at least 2 more weeks, but she finally got her final dose on January 4th.  Iron studies, B12 and folate were normal. Her anemia has slowly improved.  She started weekly paclitaxel on January 26th.   She is taking dexamethasone 77m premedication for paclitaxel 2 tablets the night before and the morning of chemotherapy. She has pre-existing neuropathy of the feet and legs.  INTERVAL HISTORY:  ACaleahis here for follow up prior to a 7th cycle of weekly paclitaxel.  She reports significant neuropathy and has dropped and broken multiple glasses.  She also notes generalized weakness and fatigue.  We will plan a 15-20% dose reduction to try and improve this.  Her white count has mildly improved from 2.0 to 2.2. with an AFair [place]of 1230, her hemoglobin is stable at 9.9, and her platelet count has improved from 73,000 to 104,000.  Chemistries are unremarkable.  Her  appetite is good, and she has lost 1 and 1/2 pounds since her last visit.  She denies fever, chills or other signs of infection.  She denies nausea, vomiting, bowel issues, or abdominal pain.  She denies sore throat, cough, dyspnea, or chest pain.  REVIEW OF SYSTEMS:  Review of Systems  Constitutional: Positive for fatigue. Negative for appetite change, chills, fever and unexpected weight change.       Generalized weakness  HENT:  Negative.   Eyes: Negative.   Respiratory: Negative.  Negative for chest tightness, cough, hemoptysis, shortness of breath and wheezing.   Cardiovascular: Negative.  Negative for chest pain, leg swelling and palpitations.  Gastrointestinal: Negative.  Negative for abdominal distention, abdominal  pain, blood in stool, constipation, diarrhea, nausea and vomiting.  Endocrine: Negative.   Genitourinary: Negative.  Negative for difficulty urinating, dysuria, frequency and hematuria.   Musculoskeletal: Negative.  Negative for arthralgias, back pain, flank pain, gait problem and myalgias.  Skin: Negative.   Neurological: Positive for numbness (of the bilateral hands and feet). Negative for dizziness, extremity weakness, gait problem, headaches, light-headedness, seizures and speech difficulty.  Hematological: Negative.   Psychiatric/Behavioral: Negative.  Negative for depression and sleep disturbance. The patient is not nervous/anxious.     VITALS:  Temperature 98.3 F (36.8 C), temperature source Oral, resp. rate 18, height 5' 2"  (1.575 m), weight 196 lb 1.6 oz (89 kg), last menstrual period 06/13/2009, SpO2 100 %.  Wt Readings from Last 3 Encounters:  03/30/20 196 lb 1.6 oz (89 kg)  03/25/20 197 lb (89.4 kg)  03/18/20 200 lb 12 oz (91.1 kg)    Body mass index is 35.87 kg/m.  Performance status (ECOG): 1 - Symptomatic but completely ambulatory  PHYSICAL EXAM:  Physical Exam Constitutional:      General: She is not in acute distress.    Appearance: Normal appearance. She is normal weight.  HENT:     Head: Normocephalic and atraumatic.  Eyes:  General: No scleral icterus.    Extraocular Movements: Extraocular movements intact.     Conjunctiva/sclera: Conjunctivae normal.     Pupils: Pupils are equal, round, and reactive to light.  Cardiovascular:     Rate and Rhythm: Normal rate and regular rhythm.     Pulses: Normal pulses.     Heart sounds: Normal heart sounds. No murmur heard. No friction rub. No gallop.   Pulmonary:     Effort: Pulmonary effort is normal. No respiratory distress.     Breath sounds: Normal breath sounds.  Chest:  Breasts:     Right: Absent.     Left: Absent.      Comments: Bilateral mastectomies are negative Abdominal:     General: Bowel sounds  are normal. There is no distension.     Palpations: Abdomen is soft. There is hepatomegaly (just below the right costal margin and mildly nodular). There is no splenomegaly or mass.     Tenderness: There is no abdominal tenderness.  Musculoskeletal:        General: Normal range of motion.     Cervical back: Normal range of motion and neck supple.     Right lower leg: No edema.     Left lower leg: No edema.  Lymphadenopathy:     Cervical: No cervical adenopathy.  Skin:    General: Skin is warm and dry.  Neurological:     General: No focal deficit present.     Mental Status: She is alert and oriented to person, place, and time. Mental status is at baseline.  Psychiatric:        Mood and Affect: Mood normal.        Behavior: Behavior normal.        Thought Content: Thought content normal.        Judgment: Judgment normal.    LABS:   CBC Latest Ref Rng & Units 03/23/2020 03/16/2020 03/09/2020  WBC - 2.0 3.3 2.5  Hemoglobin 12.0 - 16.0 9.8(A) 11.5(A) 11.1(A)  Hematocrit 36 - 46 30(A) 34(A) 33(A)  Platelets 150 - 399 73(A) 140(A) 67(A)   CMP Latest Ref Rng & Units 03/23/2020 03/16/2020 03/09/2020  Glucose 70 - 99 mg/dL - - -  BUN 4 - 21 4 14 13   Creatinine 0.5 - 1.1 0.6 1.0 0.7  Sodium 137 - 147 136(A) 131(A) 136(A)  Potassium 3.4 - 5.3 3.7 3.8 2.8(A)  Chloride 99 - 108 106 96(A) 102  CO2 13 - 22 24(A) 26(A) 24(A)  Calcium 8.7 - 10.7 8.7 8.8 8.6(A)  Total Protein 6.5 - 8.1 g/dL - - -  Total Bilirubin 0.3 - 1.2 mg/dL - - -  Alkaline Phos 25 - 125 81 103 103  AST 13 - 35 26 40(A) 27  ALT 7 - 35 22 23 22       Lab Results  Component Value Date   TIBC 300 01/13/2020   IRONPCTSAT 24.3 01/13/2020   No results found for: LDH  STUDIES:  No results found.    HISTORY:   Allergies:  Allergies  Allergen Reactions  . Codeine Shortness Of Breath  . Celecoxib Other (See Comments)    Unknown reaction  . Ezetimibe-Simvastatin Other (See Comments)    Unknown reaction  .  Propranolol Hcl Other (See Comments)    Unknown reaction    Current Medications: Current Outpatient Medications  Medication Sig Dispense Refill  . benzonatate (TESSALON) 100 MG capsule Take 1 capsule (100 mg total) by mouth 3 (three) times daily  as needed for cough. 30 capsule 0  . Blood Glucose Monitoring Suppl (ONETOUCH VERIO REFLECT) w/Device KIT AS DIRECTED    . buPROPion (WELLBUTRIN XL) 300 MG 24 hr tablet TAKE ONE TABLET BY MOUTH EVERY EVENING 90 tablet 1  . calcium citrate-vitamin D (CITRACAL+D) 315-200 MG-UNIT tablet Take 1 tablet by mouth 2 (two) times daily.    . clotrimazole-betamethasone (LOTRISONE) cream Apply small amount to affected area twice daily (Patient taking differently: as needed. Apply small amount to affected area twice daily) 45 g 0  . Continuous Blood Gluc Receiver (FREESTYLE LIBRE 2 READER) DEVI E11.69 Check blood sugar 4 times daily as directed 1 each 0  . Continuous Blood Gluc Sensor (FREESTYLE LIBRE 2 SENSOR) MISC E11.69 Change sensor every 14 days as directed 6 each 3  . cyclobenzaprine (FLEXERIL) 10 MG tablet TAKE ONE TABLET BY MOUTH EVERY 8 HOURS AS NEEDED FOR MUSCLE SPASMS 30 tablet 3  . dexamethasone (DECADRON) 4 MG tablet Take 4 mg by mouth 2 (two) times daily with a meal.    . dicyclomine (BENTYL) 20 MG tablet TAKE ONE TABLET BY MOUTH BEFORE MEALS AND AT BEDTIME AS NEEDED FOR STOMACH CRAMPING 180 tablet 1  . doxycycline (VIBRA-TABS) 100 MG tablet Take 100 mg by mouth 2 (two) times daily.    . Dulaglutide (TRULICITY) 8.56 DJ/4.9FW SOPN INJECT ONE SYRINGE ONCE A WEEK 6 mL 1  . Empagliflozin-metFORMIN HCl (SYNJARDY) 12.05-998 MG TABS Take 1 tablet by mouth 2 (two) times daily. 180 tablet 0  . famotidine (PEPCID) 20 MG tablet TAKE ONE TABLET BY MOUTH TWICE DAILY 180 tablet 1  . ferrous sulfate 325 (65 FE) MG tablet Take 325 mg by mouth every evening.    Marland Kitchen FETZIMA 80 MG CP24 TAKE ONE CAPSULE BY MOUTH ONCE DAILY 90 capsule 1  . gabapentin (NEURONTIN) 300 MG  capsule Take 1 capsule (300 mg total) by mouth 3 (three) times daily. 270 capsule 0  . glucose blood test strip 1 each by Other route in the morning and at bedtime. One Touch Verio - Patient checks blood sugar 2-3 times daily. 100 each 2  . insulin aspart (NOVOLOG FLEXPEN) 100 UNIT/ML FlexPen Three times before meals while on prednisone. 15 mL 11  . Insulin Pen Needle (BD PEN NEEDLE NANO U/F) 32G X 4 MM MISC Inject 1 each into the skin in the morning and at bedtime. 200 each 3  . LANTUS SOLOSTAR 100 UNIT/ML Solostar Pen INJECT 68 UNITS SUBCUTANEOUSLY EVERYDAY AT BEDTIME 15 mL 2  . levothyroxine (SYNTHROID) 75 MCG tablet TAKE ONE TABLET BY MOUTH ONCE DAILY 90 tablet 1  . loratadine (CLARITIN) 10 MG tablet Take 10 mg by mouth daily. Taking 3-4 days each week due to bone pain associated with shot from cancer center.    Marland Kitchen LORazepam (ATIVAN) 0.5 MG tablet TAKE ONE TABLET BY MOUTH TWICE DAILY AS NEEDED FOR ANXIETY 30 tablet 2  . losartan (COZAAR) 50 MG tablet TAKE ONE TABLET BY MOUTH EVERY EVENING 90 tablet 1  . morphine (MS CONTIN) 30 MG 12 hr tablet Take 1 tablet (30 mg total) by mouth every 12 (twelve) hours. 180 tablet 0  . Multiple Vitamin (MULTIVITAMIN WITH MINERALS) TABS tablet Take 1 tablet by mouth daily.     . ondansetron (ZOFRAN) 4 MG tablet Take 1 tablet (4 mg total) by mouth every 4 (four) hours as needed for nausea. 90 tablet 3  . OneTouch Delica Lancets 26V MISC 1 each by Does not apply route daily  before breakfast. Check blood sugar twice daily. 100 each 3  . pantoprazole (PROTONIX) 40 MG tablet TAKE ONE TABLET BY MOUTH TWICE DAILY 180 tablet 1  . Pegfilgrastim-jmdb (FULPHILA [place]) Inject into the skin.    . potassium chloride (MICRO-K) 10 MEQ CR capsule TAKE TWO CAPSULES BY MOUTH TWICE DAILY 180 capsule 1  . pravastatin (PRAVACHOL) 20 MG tablet Take 20 mg by mouth at bedtime.    . prochlorperazine (COMPAZINE) 10 MG tablet Take 1 tablet (10 mg total) by mouth every 6 (six) hours as needed  for nausea or vomiting. 90 tablet 3  . senna (SENOKOT) 8.6 MG tablet Take 1 tablet by mouth daily as needed for constipation.    . SODIUM FLUORIDE 5000 SENSITIVE 1.1-5 % GEL Take by mouth 2 (two) times daily.    Marland Kitchen Specialty Vitamins Products (MAGNESIUM, AMINO ACID CHELATE,) 133 MG tablet Take 1 tablet by mouth at bedtime.    . sucralfate (CARAFATE) 1 g tablet Take 1 tablet (1 g total) by mouth 4 (four) times daily -  with meals and at bedtime. 270 tablet 1  . Vitamin D, Ergocalciferol, (DRISDOL) 1.25 MG (50000 UNIT) CAPS capsule TAKE ONE CAPSULE BY MOUTH ONCE WEEKLY ON FRIDAY 12 capsule 1  . zolpidem (AMBIEN) 10 MG tablet TAKE ONE TABLET BY MOUTH EVERYDAY AT BEDTIME AS NEEDED 90 tablet 0   No current facility-administered medications for this visit.     ASSESSMENT & PLAN:   Assessment: 1. Positive BRCA 1 mutation, which increases her risk for breast and ovarian cancer, as well as elevates her risk for pancreatic cancer, for which she underwent hysterectomy/bilateral salpingo-oophorectomy and is on chemoprevention with raloxifene and regular surveillance.   She initially declined bilateral prophylactic mastectomies.   2.  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  She has undergone bilateral mastectomies.  She completed 4 cycles of Adriamycin/cyclophosphamide chemotherapy and is now receiving weekly paclitaxel and tolerating this fairly well.  3.  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  We feel this is a separate primary.  She has undergone bilateral mastectomies.  She completed 4 cycles of Adriamycin/cyclophosphamide chemotherapy and is now receiving weekly paclitaxel and tolerating this fairly well.  4. Thrombocytopenia, which is felt to be due to  chronic ITP. This has worsened with chemotherapy.  We will continue to monitor this.  She knows to contact us has abnormal bleeding or bruising.  5. Liver cirrhosis as seen on CT imaging in August 2020.  She has been referred and is being seen by Roosevelt Locks, CRNP, at the [facility] in [place].  Hepatitis panel was negative and she has received the appropriate vaccines.  Abdominal ultrasound from April 2021 was stable and CT imaging from September 2021 was stable.  She will be due for repeat abdominal ultrasound in April.  6. Anemia secondary to chemotherapy, which remains stable.  7. Diabetes, which is under better control. As she has tolerated paclitaxel well, she is no longer on steroid premedication.  8. Hypokalemia, borderline, despite potassium chloride 10 mEq 2 tablets three daily.  I also gave her a list of foods to include in her diet.  9. Mild neutropenia secondary to chemotherapy. We will continue to monitor this.  10.  Severe worsening  neuropathy.  We will plan to reduce her dose by 15-20% with her future cycles.  We had a discussion about the fact that this does not always resolve.  Plan:   She will proceed with a 7th cycle of weekly paclitaxel on March 9th, but as she has significant neuropathy, we will decrease her dose by 15-20%.  She will continue weekly labs and paclitaxel.  We will plan to see her back in 3 weeks with a CBC and comprehensive metabolic panel for re-examination.  The patient understands the plans discussed today and is in agreement with them.  She knows to contact our office if she develops concerns prior to her next appointment.   Derwood Kaplan, MD [facility] [address] [place] [address] Dept: [phone] Dept Fax: [phone]   I, Rita Ohara, am acting as scribe for Derwood Kaplan, MD  I have reviewed this report as typed by the  medical scribe, and it is complete and accurate.  Hermina Barters

## 2020-03-30 ENCOUNTER — Other Ambulatory Visit: Payer: Self-pay | Admitting: Oncology

## 2020-03-30 ENCOUNTER — Other Ambulatory Visit: Payer: Self-pay

## 2020-03-30 ENCOUNTER — Telehealth: Payer: Self-pay | Admitting: Oncology

## 2020-03-30 ENCOUNTER — Other Ambulatory Visit: Payer: Self-pay | Admitting: Hematology and Oncology

## 2020-03-30 ENCOUNTER — Inpatient Hospital Stay (INDEPENDENT_AMBULATORY_CARE_PROVIDER_SITE_OTHER): Payer: PPO | Admitting: Oncology

## 2020-03-30 ENCOUNTER — Encounter: Payer: Self-pay | Admitting: Oncology

## 2020-03-30 ENCOUNTER — Inpatient Hospital Stay: Payer: PPO

## 2020-03-30 VITALS — Temp 98.3°F | Resp 18 | Ht 62.0 in | Wt 196.1 lb

## 2020-03-30 DIAGNOSIS — C50311 Malignant neoplasm of lower-inner quadrant of right female breast: Secondary | ICD-10-CM

## 2020-03-30 DIAGNOSIS — Z171 Estrogen receptor negative status [ER-]: Secondary | ICD-10-CM

## 2020-03-30 DIAGNOSIS — Z1501 Genetic susceptibility to malignant neoplasm of breast: Secondary | ICD-10-CM

## 2020-03-30 DIAGNOSIS — D649 Anemia, unspecified: Secondary | ICD-10-CM | POA: Diagnosis not present

## 2020-03-30 DIAGNOSIS — Z1509 Genetic susceptibility to other malignant neoplasm: Secondary | ICD-10-CM

## 2020-03-30 DIAGNOSIS — D696 Thrombocytopenia, unspecified: Secondary | ICD-10-CM | POA: Diagnosis not present

## 2020-03-30 LAB — COMPREHENSIVE METABOLIC PANEL
Albumin: 3.8 (ref 3.5–5.0)
Calcium: 8.5 — AB (ref 8.7–10.7)

## 2020-03-30 LAB — CBC AND DIFFERENTIAL
HCT: 30 — AB (ref 36–46)
Hemoglobin: 9.9 — AB (ref 12.0–16.0)
Neutrophils Absolute: 1.23
Platelets: 104 — AB (ref 150–399)
WBC: 2.2

## 2020-03-30 LAB — BASIC METABOLIC PANEL
BUN: 8 (ref 4–21)
CO2: 21 (ref 13–22)
Chloride: 104 (ref 99–108)
Creatinine: 0.5 (ref 0.5–1.1)
Glucose: 174
Potassium: 3.5 (ref 3.4–5.3)
Sodium: 135 — AB (ref 137–147)

## 2020-03-30 LAB — HEPATIC FUNCTION PANEL
ALT: 20 (ref 7–35)
AST: 23 (ref 13–35)
Alkaline Phosphatase: 95 (ref 25–125)
Bilirubin, Total: 1

## 2020-03-30 LAB — CBC: RBC: 3.11 — AB (ref 3.87–5.11)

## 2020-03-30 NOTE — Telephone Encounter (Signed)
Per 3/7 los next appt sched and given to patient

## 2020-03-31 ENCOUNTER — Other Ambulatory Visit: Payer: Self-pay | Admitting: Oncology

## 2020-04-01 ENCOUNTER — Inpatient Hospital Stay: Payer: PPO

## 2020-04-01 ENCOUNTER — Other Ambulatory Visit: Payer: Self-pay

## 2020-04-01 VITALS — BP 145/76 | HR 102 | Temp 98.4°F | Resp 18 | Ht 62.0 in | Wt 197.1 lb

## 2020-04-01 DIAGNOSIS — C50311 Malignant neoplasm of lower-inner quadrant of right female breast: Secondary | ICD-10-CM

## 2020-04-01 DIAGNOSIS — Z5111 Encounter for antineoplastic chemotherapy: Secondary | ICD-10-CM | POA: Diagnosis not present

## 2020-04-01 DIAGNOSIS — Z171 Estrogen receptor negative status [ER-]: Secondary | ICD-10-CM

## 2020-04-01 MED ORDER — DIPHENHYDRAMINE HCL 50 MG/ML IJ SOLN
INTRAMUSCULAR | Status: AC
  Filled 2020-04-01: qty 1

## 2020-04-01 MED ORDER — SODIUM CHLORIDE 0.9 % IV SOLN
Freq: Once | INTRAVENOUS | Status: AC
  Filled 2020-04-01: qty 250

## 2020-04-01 MED ORDER — PACLITAXEL CHEMO INJECTION 300 MG/50ML
64.0000 mg/m2 | Freq: Once | INTRAVENOUS | Status: AC
  Administered 2020-04-01: 132 mg via INTRAVENOUS
  Filled 2020-04-01: qty 22

## 2020-04-01 MED ORDER — FAMOTIDINE IN NACL 20-0.9 MG/50ML-% IV SOLN
20.0000 mg | Freq: Once | INTRAVENOUS | Status: AC
  Administered 2020-04-01: 20 mg via INTRAVENOUS

## 2020-04-01 MED ORDER — FAMOTIDINE IN NACL 20-0.9 MG/50ML-% IV SOLN
INTRAVENOUS | Status: AC
  Filled 2020-04-01: qty 50

## 2020-04-01 MED ORDER — HEPARIN SOD (PORK) LOCK FLUSH 100 UNIT/ML IV SOLN
500.0000 [IU] | Freq: Once | INTRAVENOUS | Status: AC | PRN
  Administered 2020-04-01: 500 [IU]
  Filled 2020-04-01: qty 5

## 2020-04-01 MED ORDER — DIPHENHYDRAMINE HCL 50 MG/ML IJ SOLN
25.0000 mg | Freq: Once | INTRAMUSCULAR | Status: AC
  Administered 2020-04-01: 25 mg via INTRAVENOUS

## 2020-04-01 MED ORDER — DEXAMETHASONE SODIUM PHOSPHATE 10 MG/ML IJ SOLN
8.0000 mg | Freq: Once | INTRAMUSCULAR | Status: AC
  Administered 2020-04-01: 8 mg via INTRAVENOUS

## 2020-04-01 MED ORDER — DEXAMETHASONE SODIUM PHOSPHATE 10 MG/ML IJ SOLN
INTRAMUSCULAR | Status: AC
  Filled 2020-04-01: qty 1

## 2020-04-01 NOTE — Patient Instructions (Signed)
[facility] Discharge Instructions for Patients Receiving Chemotherapy  Today you received the following chemotherapy agents Paclitaxel  To help prevent nausea and vomiting after your treatment, we encourage you to take your nausea medication as directed   If you develop nausea and vomiting that is not controlled by your nausea medication, call the clinic.   BELOW ARE SYMPTOMS THAT SHOULD BE REPORTED IMMEDIATELY:  *FEVER GREATER THAN 100.5 F  *CHILLS WITH OR WITHOUT FEVER  NAUSEA AND VOMITING THAT IS NOT CONTROLLED WITH YOUR NAUSEA MEDICATION  *UNUSUAL SHORTNESS OF BREATH  *UNUSUAL BRUISING OR BLEEDING  TENDERNESS IN MOUTH AND THROAT WITH OR WITHOUT PRESENCE OF ULCERS  *URINARY PROBLEMS  *BOWEL PROBLEMS  UNUSUAL RASH Items with * indicate a potential emergency and should be followed up as soon as possible.  Feel free to call the clinic should you have any questions or concerns at The clinic phone number is [phone].  Please show the [facility] at check-in to the Emergency Department and triage nurse.

## 2020-04-06 ENCOUNTER — Other Ambulatory Visit: Payer: Self-pay | Admitting: Family Medicine

## 2020-04-06 ENCOUNTER — Telehealth: Payer: Self-pay

## 2020-04-06 ENCOUNTER — Other Ambulatory Visit: Payer: Self-pay

## 2020-04-06 DIAGNOSIS — Z1501 Genetic susceptibility to malignant neoplasm of breast: Secondary | ICD-10-CM

## 2020-04-06 DIAGNOSIS — Z1509 Genetic susceptibility to other malignant neoplasm: Secondary | ICD-10-CM

## 2020-04-06 DIAGNOSIS — M62838 Other muscle spasm: Secondary | ICD-10-CM

## 2020-04-06 MED ORDER — CYCLOBENZAPRINE HCL 10 MG PO TABS: ORAL_TABLET | ORAL | 3 refills | Status: DC

## 2020-04-06 MED ORDER — SUCRALFATE 1 G PO TABS: 1.0000 g | ORAL_TABLET | Freq: Three times a day (TID) | ORAL | 1 refills | Status: DC

## 2020-04-06 NOTE — Progress Notes (Signed)
Chronic Care Management Pharmacy Assistant   Name: Linda [name]  MRN: [id number] DOB: 1970-04-14  Reason for Encounter: Medication Review for Upstream delivery    Recent office visits:  04/01/20-infusion 03/30/20-infusion 03/27/20-podiatry  Recent consult visits:  None  Hospital visits:  None in previous 6 months  Medications: Outpatient Encounter Medications as of 04/06/2020  Medication Sig Note   benzonatate (TESSALON) 100 MG capsule Take 1 capsule (100 mg total) by mouth 3 (three) times daily as needed for cough.    Blood Glucose Monitoring Suppl (ONETOUCH VERIO REFLECT) w/Device KIT AS DIRECTED    buPROPion (WELLBUTRIN XL) 300 MG 24 hr tablet TAKE ONE TABLET BY MOUTH EVERY EVENING    calcium citrate-vitamin D (CITRACAL+D) 315-200 MG-UNIT tablet Take 1 tablet by mouth 2 (two) times daily.    clotrimazole-betamethasone (LOTRISONE) cream Apply small amount to affected area twice daily (Patient taking differently: as needed. Apply small amount to affected area twice daily)    Continuous Blood Gluc Receiver (FREESTYLE LIBRE 2 READER) DEVI E11.69 Check blood sugar 4 times daily as directed    Continuous Blood Gluc Sensor (FREESTYLE LIBRE 2 SENSOR) MISC E11.69 Change sensor every 14 days as directed    cyclobenzaprine (FLEXERIL) 10 MG tablet TAKE ONE TABLET BY MOUTH EVERY 8 HOURS AS NEEDED FOR MUSCLE SPASMS    dexamethasone (DECADRON) 4 MG tablet Take 4 mg by mouth 2 (two) times daily with a meal.    dicyclomine (BENTYL) 20 MG tablet TAKE ONE TABLET BY MOUTH BEFORE MEALS AND AT BEDTIME AS NEEDED FOR STOMACH CRAMPING    doxycycline (VIBRA-TABS) 100 MG tablet Take 100 mg by mouth 2 (two) times daily.    Dulaglutide (TRULICITY) 3.50 KX/3.8HW SOPN INJECT ONE SYRINGE ONCE A WEEK    Empagliflozin-metFORMIN HCl (SYNJARDY) 12.05-998 MG TABS Take 1 tablet by mouth 2 (two) times daily.    famotidine (PEPCID) 20 MG tablet TAKE ONE TABLET BY MOUTH TWICE DAILY    ferrous  sulfate 325 (65 FE) MG tablet Take 325 mg by mouth every evening.    FETZIMA 80 MG CP24 TAKE ONE CAPSULE BY MOUTH ONCE DAILY    gabapentin (NEURONTIN) 300 MG capsule Take 1 capsule (300 mg total) by mouth 3 (three) times daily.    glucose blood test strip 1 each by Other route in the morning and at bedtime. One Touch Verio - Patient checks blood sugar 2-3 times daily.    insulin aspart (NOVOLOG FLEXPEN) 100 UNIT/ML FlexPen Three times before meals while on prednisone.    Insulin Pen Needle (BD PEN NEEDLE NANO U/F) 32G X 4 MM MISC Inject 1 each into the skin in the morning and at bedtime.    LANTUS SOLOSTAR 100 UNIT/ML Solostar Pen INJECT 68 UNITS SUBCUTANEOUSLY EVERYDAY AT BEDTIME    levothyroxine (SYNTHROID) 75 MCG tablet TAKE ONE TABLET BY MOUTH ONCE DAILY    loratadine (CLARITIN) 10 MG tablet Take 10 mg by mouth daily. Taking 3-4 days each week due to bone pain associated with shot from cancer center.    LORazepam (ATIVAN) 0.5 MG tablet TAKE ONE TABLET BY MOUTH TWICE DAILY AS NEEDED FOR ANXIETY    losartan (COZAAR) 50 MG tablet TAKE ONE TABLET BY MOUTH EVERY EVENING    morphine (MS CONTIN) 30 MG 12 hr tablet Take 1 tablet (30 mg total) by mouth every 12 (twelve) hours.    Multiple Vitamin (MULTIVITAMIN WITH MINERALS) TABS tablet Take 1 tablet by mouth daily.  06/08/2018: Pt plans on purchasing again  when she can get out.   ondansetron (ZOFRAN) 4 MG tablet Take 1 tablet (4 mg total) by mouth every 4 (four) hours as needed for nausea.    OneTouch Delica Lancets 21J MISC 1 each by Does not apply route daily before breakfast. Check blood sugar twice daily.    pantoprazole (PROTONIX) 40 MG tablet TAKE ONE TABLET BY MOUTH TWICE DAILY    Pegfilgrastim-jmdb (FULPHILA [place]) Inject into the skin.    potassium chloride (MICRO-K) 10 MEQ CR capsule TAKE TWO CAPSULES BY MOUTH TWICE DAILY    pravastatin (PRAVACHOL) 20 MG tablet Take 20 mg by mouth at bedtime.    prochlorperazine (COMPAZINE) 10  MG tablet Take 1 tablet (10 mg total) by mouth every 6 (six) hours as needed for nausea or vomiting.    senna (SENOKOT) 8.6 MG tablet Take 1 tablet by mouth daily as needed for constipation.    SODIUM FLUORIDE 5000 SENSITIVE 1.1-5 % GEL Take by mouth 2 (two) times daily.    Specialty Vitamins Products (MAGNESIUM, AMINO ACID CHELATE,) 133 MG tablet Take 1 tablet by mouth at bedtime.    sucralfate (CARAFATE) 1 g tablet Take 1 tablet (1 g total) by mouth 4 (four) times daily -  with meals and at bedtime.    Vitamin D, Ergocalciferol, (DRISDOL) 1.25 MG (50000 UNIT) CAPS capsule TAKE ONE CAPSULE BY MOUTH ONCE WEEKLY ON FRIDAY    zolpidem (AMBIEN) 10 MG tablet TAKE ONE TABLET BY MOUTH EVERYDAY AT BEDTIME AS NEEDED    No facility-administered encounter medications on file as of 04/06/2020.   Reviewed chart for medication changes ahead of medication coordination call.  No OVs, Consults, or hospital visits since last care coordination call/Pharmacist visit. (If appropriate, list visit date, provider name)  No medication changes indicated OR if recent visit, treatment plan here.  BP Readings from Last 3 Encounters:  04/01/20 (!) 145/76  03/25/20 (!) 150/67  03/18/20 128/71    Lab Results  Component Value Date   HGBA1C 6.9 (H) 10/09/2019     Patient obtains medications through Vials  30 Days   Last adherence delivery included:  Famotidine 20 mg- 1 tablet twice daily Fetzima-80 mg- 1 capsule daily Pravastatin 20 mg- 1 tablet at bedtime  Lantus Solostar U 100- inject 0.38minto skin at bedtime Onetouch verio test strops use to check blood sugar 2-3 times daily Ondansetron 42m Take 1 tablet every 4 hours as needed for nausea Lorazepam 0.5 mg- 1 tablet twice a day as needed Sucralfate 1 gram- 1 tablet four times a day with meals and at bedtime. Cyclobenzaprine 10 mg - 1 tablet by mouth every 8 hours as needed.  Potassium chloride ER 10 meq- 2 capsules twice daily Vitamin D 50,000  units - 1 capsule weekly on Fridays Bupropion Hcl Xl 300 mg- 1 tablet in evening Levothyroxine 75 mcg- 1 tablet daily Losartan Potassium 50 mg- 1 tablet in evening Zolpidem 10 mg- 1 tablet as needed at bedtime Gabapentin 300 mg- 1 capsule twice daily Synjardy 12.05-998 mg- 1 tablet daily Trulicity-inject 1 syringe once a week Pantoprazole 40 mgtake 1 tablet twice daily  Patient declined (meds) Dexcom Needles  Patient is due for next adherence delivery on: 04/06/20  Called patient and reviewed medications and coordinated delivery.  This delivery to include: Losartan Potassium 50 mg-  Fetzima-80 mg-  Bupropion Hcl Xl 300 mg- Potassium chloride ER 10 meq Morphine Sulfate 30 mg  Zolpidem 10 mg Famotidine 20 mg Pravastatin 20 mg-  Lantus Solostar U  100- inject 6.60YT Trulicity-inject 1 syringe once a week Pantoprazole 40 mg Sucralfate 1 gram Cyclobenzaprine 10 mg  Potassium chloride ER 10 meq Vitamin D 50,000 units  Levothyroxine 75 mcg Ondansetron 37m-   Patient needs refills for  Morphine 362mAmbien Sucralfate Flexeril   Confirmed delivery date of 04/06/20, advised patient that pharmacy will contact them the morning of delivery.  Form was filled out and sent to SaDonette Larryor approval   TaClarita LeberCMMarlboro Villageharmacist Assistant [phone]

## 2020-04-08 ENCOUNTER — Inpatient Hospital Stay: Payer: PPO

## 2020-04-08 ENCOUNTER — Telehealth: Payer: Self-pay | Admitting: Oncology

## 2020-04-08 ENCOUNTER — Telehealth: Payer: Self-pay

## 2020-04-08 ENCOUNTER — Other Ambulatory Visit: Payer: Self-pay | Admitting: Pharmacist

## 2020-04-08 ENCOUNTER — Other Ambulatory Visit: Payer: PPO

## 2020-04-08 DIAGNOSIS — R42 Dizziness and giddiness: Secondary | ICD-10-CM | POA: Diagnosis not present

## 2020-04-08 DIAGNOSIS — M869 Osteomyelitis, unspecified: Secondary | ICD-10-CM | POA: Diagnosis not present

## 2020-04-08 DIAGNOSIS — Z853 Personal history of malignant neoplasm of breast: Secondary | ICD-10-CM | POA: Diagnosis not present

## 2020-04-08 DIAGNOSIS — E876 Hypokalemia: Secondary | ICD-10-CM | POA: Diagnosis not present

## 2020-04-08 DIAGNOSIS — N179 Acute kidney failure, unspecified: Secondary | ICD-10-CM | POA: Diagnosis not present

## 2020-04-08 DIAGNOSIS — Z794 Long term (current) use of insulin: Secondary | ICD-10-CM | POA: Diagnosis not present

## 2020-04-08 DIAGNOSIS — M79672 Pain in left foot: Secondary | ICD-10-CM | POA: Diagnosis not present

## 2020-04-08 DIAGNOSIS — L97529 Non-pressure chronic ulcer of other part of left foot with unspecified severity: Secondary | ICD-10-CM | POA: Diagnosis not present

## 2020-04-08 DIAGNOSIS — E11621 Type 2 diabetes mellitus with foot ulcer: Secondary | ICD-10-CM | POA: Diagnosis not present

## 2020-04-08 DIAGNOSIS — M86172 Other acute osteomyelitis, left ankle and foot: Secondary | ICD-10-CM | POA: Diagnosis not present

## 2020-04-08 DIAGNOSIS — C50919 Malignant neoplasm of unspecified site of unspecified female breast: Secondary | ICD-10-CM | POA: Diagnosis not present

## 2020-04-08 DIAGNOSIS — E13621 Other specified diabetes mellitus with foot ulcer: Secondary | ICD-10-CM | POA: Diagnosis not present

## 2020-04-08 DIAGNOSIS — Z872 Personal history of diseases of the skin and subcutaneous tissue: Secondary | ICD-10-CM | POA: Diagnosis not present

## 2020-04-08 NOTE — Telephone Encounter (Signed)
04/08/20 Left VM and cancelled appt on 3/17

## 2020-04-08 NOTE — Telephone Encounter (Signed)
Spoke with Melissa NP, her advice was for to go to the ED to have CT of head, told her not to worry about her labs appt here today.  She said step mom could bring her to emergency room and she did say that she has walked some this morning and is a little better.

## 2020-04-09 ENCOUNTER — Inpatient Hospital Stay: Payer: PPO

## 2020-04-09 ENCOUNTER — Telehealth: Payer: Self-pay

## 2020-04-09 DIAGNOSIS — M7989 Other specified soft tissue disorders: Secondary | ICD-10-CM | POA: Diagnosis not present

## 2020-04-09 DIAGNOSIS — M79662 Pain in left lower leg: Secondary | ICD-10-CM | POA: Diagnosis not present

## 2020-04-09 DIAGNOSIS — M79605 Pain in left leg: Secondary | ICD-10-CM | POA: Diagnosis not present

## 2020-04-09 DIAGNOSIS — M86171 Other acute osteomyelitis, right ankle and foot: Secondary | ICD-10-CM | POA: Diagnosis not present

## 2020-04-09 DIAGNOSIS — A419 Sepsis, unspecified organism: Secondary | ICD-10-CM | POA: Diagnosis not present

## 2020-04-09 DIAGNOSIS — I10 Essential (primary) hypertension: Secondary | ICD-10-CM | POA: Diagnosis not present

## 2020-04-09 NOTE — Telephone Encounter (Addendum)
Pt called to state she had to leave the hospital last night against medical advice, because she didn't have anyone to care of her dog. Shestates, "I'm going back to the hospital tonight and they are going to admit me. She told me it was really serious and I could die". She is taking her dog somewhere this evening and she hopes they will keep her.  I forwarded the actual call to Dr Hinton Rao & [facility].   Pt was admitted to Room 384.

## 2020-04-10 DIAGNOSIS — Z853 Personal history of malignant neoplasm of breast: Secondary | ICD-10-CM | POA: Diagnosis not present

## 2020-04-10 DIAGNOSIS — E1165 Type 2 diabetes mellitus with hyperglycemia: Secondary | ICD-10-CM | POA: Diagnosis not present

## 2020-04-10 DIAGNOSIS — F418 Other specified anxiety disorders: Secondary | ICD-10-CM | POA: Diagnosis not present

## 2020-04-10 DIAGNOSIS — D696 Thrombocytopenia, unspecified: Secondary | ICD-10-CM | POA: Diagnosis not present

## 2020-04-10 DIAGNOSIS — Z79899 Other long term (current) drug therapy: Secondary | ICD-10-CM | POA: Diagnosis not present

## 2020-04-10 DIAGNOSIS — N289 Disorder of kidney and ureter, unspecified: Secondary | ICD-10-CM | POA: Diagnosis not present

## 2020-04-10 DIAGNOSIS — A419 Sepsis, unspecified organism: Secondary | ICD-10-CM | POA: Diagnosis not present

## 2020-04-10 DIAGNOSIS — M86171 Other acute osteomyelitis, right ankle and foot: Secondary | ICD-10-CM | POA: Diagnosis not present

## 2020-04-10 DIAGNOSIS — Z171 Estrogen receptor negative status [ER-]: Secondary | ICD-10-CM | POA: Diagnosis not present

## 2020-04-10 DIAGNOSIS — C50919 Malignant neoplasm of unspecified site of unspecified female breast: Secondary | ICD-10-CM | POA: Diagnosis not present

## 2020-04-10 DIAGNOSIS — G4733 Obstructive sleep apnea (adult) (pediatric): Secondary | ICD-10-CM | POA: Diagnosis not present

## 2020-04-10 DIAGNOSIS — E1142 Type 2 diabetes mellitus with diabetic polyneuropathy: Secondary | ICD-10-CM | POA: Diagnosis not present

## 2020-04-10 DIAGNOSIS — M869 Osteomyelitis, unspecified: Secondary | ICD-10-CM | POA: Diagnosis not present

## 2020-04-10 DIAGNOSIS — I10 Essential (primary) hypertension: Secondary | ICD-10-CM | POA: Diagnosis not present

## 2020-04-10 DIAGNOSIS — Z794 Long term (current) use of insulin: Secondary | ICD-10-CM | POA: Diagnosis not present

## 2020-04-10 DIAGNOSIS — B961 Klebsiella pneumoniae [K. pneumoniae] as the cause of diseases classified elsewhere: Secondary | ICD-10-CM | POA: Diagnosis not present

## 2020-04-10 DIAGNOSIS — C50911 Malignant neoplasm of unspecified site of right female breast: Secondary | ICD-10-CM | POA: Diagnosis not present

## 2020-04-10 DIAGNOSIS — M79662 Pain in left lower leg: Secondary | ICD-10-CM | POA: Diagnosis not present

## 2020-04-10 DIAGNOSIS — E11621 Type 2 diabetes mellitus with foot ulcer: Secondary | ICD-10-CM | POA: Diagnosis not present

## 2020-04-10 DIAGNOSIS — Z5329 Procedure and treatment not carried out because of patient's decision for other reasons: Secondary | ICD-10-CM | POA: Diagnosis not present

## 2020-04-10 DIAGNOSIS — Z9013 Acquired absence of bilateral breasts and nipples: Secondary | ICD-10-CM | POA: Diagnosis not present

## 2020-04-10 DIAGNOSIS — E785 Hyperlipidemia, unspecified: Secondary | ICD-10-CM | POA: Diagnosis not present

## 2020-04-10 DIAGNOSIS — L0390 Cellulitis, unspecified: Secondary | ICD-10-CM | POA: Diagnosis not present

## 2020-04-10 DIAGNOSIS — L97529 Non-pressure chronic ulcer of other part of left foot with unspecified severity: Secondary | ICD-10-CM | POA: Diagnosis not present

## 2020-04-10 DIAGNOSIS — M79605 Pain in left leg: Secondary | ICD-10-CM | POA: Diagnosis not present

## 2020-04-10 DIAGNOSIS — M868X7 Other osteomyelitis, ankle and foot: Secondary | ICD-10-CM | POA: Diagnosis not present

## 2020-04-10 DIAGNOSIS — C50311 Malignant neoplasm of lower-inner quadrant of right female breast: Secondary | ICD-10-CM | POA: Diagnosis not present

## 2020-04-10 DIAGNOSIS — E876 Hypokalemia: Secondary | ICD-10-CM | POA: Diagnosis not present

## 2020-04-10 DIAGNOSIS — Z862 Personal history of diseases of the blood and blood-forming organs and certain disorders involving the immune mechanism: Secondary | ICD-10-CM | POA: Diagnosis not present

## 2020-04-10 DIAGNOSIS — M7989 Other specified soft tissue disorders: Secondary | ICD-10-CM | POA: Diagnosis not present

## 2020-04-10 DIAGNOSIS — D649 Anemia, unspecified: Secondary | ICD-10-CM | POA: Diagnosis not present

## 2020-04-10 DIAGNOSIS — E860 Dehydration: Secondary | ICD-10-CM | POA: Diagnosis not present

## 2020-04-10 DIAGNOSIS — E119 Type 2 diabetes mellitus without complications: Secondary | ICD-10-CM | POA: Diagnosis not present

## 2020-04-10 DIAGNOSIS — Z9181 History of falling: Secondary | ICD-10-CM | POA: Diagnosis not present

## 2020-04-13 ENCOUNTER — Telehealth: Payer: Self-pay

## 2020-04-13 ENCOUNTER — Inpatient Hospital Stay: Payer: PPO | Admitting: Hematology and Oncology

## 2020-04-13 ENCOUNTER — Inpatient Hospital Stay: Payer: PPO

## 2020-04-13 DIAGNOSIS — Z794 Long term (current) use of insulin: Secondary | ICD-10-CM | POA: Diagnosis not present

## 2020-04-13 DIAGNOSIS — L97529 Non-pressure chronic ulcer of other part of left foot with unspecified severity: Secondary | ICD-10-CM | POA: Diagnosis not present

## 2020-04-13 DIAGNOSIS — Z452 Encounter for adjustment and management of vascular access device: Secondary | ICD-10-CM | POA: Diagnosis not present

## 2020-04-13 DIAGNOSIS — E1169 Type 2 diabetes mellitus with other specified complication: Secondary | ICD-10-CM | POA: Diagnosis not present

## 2020-04-13 DIAGNOSIS — Z7984 Long term (current) use of oral hypoglycemic drugs: Secondary | ICD-10-CM | POA: Diagnosis not present

## 2020-04-13 DIAGNOSIS — A419 Sepsis, unspecified organism: Secondary | ICD-10-CM | POA: Diagnosis not present

## 2020-04-13 DIAGNOSIS — F419 Anxiety disorder, unspecified: Secondary | ICD-10-CM | POA: Diagnosis not present

## 2020-04-13 DIAGNOSIS — N200 Calculus of kidney: Secondary | ICD-10-CM | POA: Diagnosis not present

## 2020-04-13 DIAGNOSIS — C50919 Malignant neoplasm of unspecified site of unspecified female breast: Secondary | ICD-10-CM | POA: Diagnosis not present

## 2020-04-13 DIAGNOSIS — A4159 Other Gram-negative sepsis: Secondary | ICD-10-CM | POA: Diagnosis not present

## 2020-04-13 DIAGNOSIS — M1990 Unspecified osteoarthritis, unspecified site: Secondary | ICD-10-CM | POA: Diagnosis not present

## 2020-04-13 DIAGNOSIS — F32A Depression, unspecified: Secondary | ICD-10-CM | POA: Diagnosis not present

## 2020-04-13 DIAGNOSIS — M86171 Other acute osteomyelitis, right ankle and foot: Secondary | ICD-10-CM | POA: Diagnosis not present

## 2020-04-13 DIAGNOSIS — Z9013 Acquired absence of bilateral breasts and nipples: Secondary | ICD-10-CM | POA: Diagnosis not present

## 2020-04-13 DIAGNOSIS — I10 Essential (primary) hypertension: Secondary | ICD-10-CM | POA: Diagnosis not present

## 2020-04-13 DIAGNOSIS — M86172 Other acute osteomyelitis, left ankle and foot: Secondary | ICD-10-CM | POA: Diagnosis not present

## 2020-04-13 DIAGNOSIS — Z79899 Other long term (current) drug therapy: Secondary | ICD-10-CM | POA: Diagnosis not present

## 2020-04-13 DIAGNOSIS — E11621 Type 2 diabetes mellitus with foot ulcer: Secondary | ICD-10-CM | POA: Diagnosis not present

## 2020-04-13 DIAGNOSIS — Z792 Long term (current) use of antibiotics: Secondary | ICD-10-CM | POA: Diagnosis not present

## 2020-04-13 NOTE — Telephone Encounter (Signed)
  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  She was contacted today via telephone in regards to transition of care.    - Patient is currently undergoing Chemo (weekly Taxol) treating breast cancer - Patient is diabetic - Patient admitted to hospital with a left 2nd toe osteomyelitis.  Amputation was recommended however she refused at this time.  In light of the refusal Dr Cannon Kettle plans to treat with long term ABT.  Culture returned with Klebsiella, sensitive to Rocephin.    Patient was discharged on 04/12/20 with orders for IV Rocephin 2 g IV Q24H x 6 weeks via her left anterior chest port.  Home health orders were sent so she can continue with their help at home.    Discharge Instructions: Follow-up with podiatry in 2 weeks  Items Reviewed:  Did the pt receive and understand the discharge instructions provided? Yes   Medications obtained and verified? Yes   Other? No   Any new allergies since your discharge? No   Dietary orders reviewed? Yes  Home Care and Equipment/Supplies: Were home health services ordered? Yes If so, what is the name of the agency? [facility]   Has the agency set up a time to come to the patient's home? No - I called [facility] at 9:50 am with no answer.  I will continue to try to verify they have her referral and will be going out today.  Were any new equipment or medical supplies ordered?  No  Functional Questionnaire: (I = Independent and D = Dependent) ADLs: I  Bathing/Dressing- I  Meal Prep- I  Eating- I  Maintaining  continence- I  Transferring/Ambulation- I  Managing Meds- I   Any patient concerns? no  Follow up appointments reviewed:  PCP Hospital f/u appt confirmed? No  Patient is going to call Podiatry office to get in there first  [facility] f/u appt confirmed? No  Patient is going to call Dr Leeanne Rio office to schedule  Are transportation arrangements needed? No   If their condition worsens, is the pt aware to call PCP or go to the Emergency Dept.? Yes  Was the patient provided with contact information for the PCP's office/after hours number? Yes  Was to pt encouraged to call back with questions or concerns? Yes    Shelle Iron, LPN 11/94/17 4:08 AM

## 2020-04-14 ENCOUNTER — Other Ambulatory Visit: Payer: Self-pay | Admitting: Pharmacist

## 2020-04-14 ENCOUNTER — Telehealth: Payer: Self-pay | Admitting: *Deleted

## 2020-04-14 ENCOUNTER — Telehealth: Payer: Self-pay | Admitting: Oncology

## 2020-04-14 NOTE — Telephone Encounter (Signed)
Per 3/21 Staff Msg, patient scheduled for 3/24 Labs 9:30 am - Follow Up 10:00 am.  Patient notified

## 2020-04-14 NOTE — Telephone Encounter (Signed)
Tiffany from [facility] called [phone]) and left a message needing a verbal order for the nurse to go out 3 times a week and to change the wound and to use honey and alginate instead of betadine due to the patient can not reach it and per the nurse the bandage is not in the right area and also to change the port line and I called Tiffany back and gave a verbal per Dr March Rummage. Lattie Haw

## 2020-04-14 NOTE — Telephone Encounter (Signed)
Debbie called from [facility] [phone]) and left a message needing a verbal order to get labs-cbc,bun,creatinine and usually does the blood work weekly and is on rocephin and I called and spoke with Jackelyn Poling and per Dr Cannon Kettle was ok with the labs. Lattie Haw

## 2020-04-15 ENCOUNTER — Inpatient Hospital Stay: Payer: PPO

## 2020-04-16 ENCOUNTER — Inpatient Hospital Stay: Payer: PPO

## 2020-04-16 ENCOUNTER — Other Ambulatory Visit: Payer: Self-pay

## 2020-04-16 ENCOUNTER — Telehealth: Payer: Self-pay | Admitting: Oncology

## 2020-04-16 ENCOUNTER — Encounter: Payer: Self-pay | Admitting: Oncology

## 2020-04-16 ENCOUNTER — Inpatient Hospital Stay (INDEPENDENT_AMBULATORY_CARE_PROVIDER_SITE_OTHER): Payer: PPO | Admitting: Oncology

## 2020-04-16 ENCOUNTER — Inpatient Hospital Stay: Payer: PPO | Admitting: Family Medicine

## 2020-04-16 ENCOUNTER — Other Ambulatory Visit: Payer: Self-pay | Admitting: Hematology and Oncology

## 2020-04-16 ENCOUNTER — Other Ambulatory Visit: Payer: Self-pay | Admitting: Oncology

## 2020-04-16 VITALS — BP 130/80 | HR 105 | Temp 98.5°F | Resp 18 | Ht 62.0 in | Wt 212.7 lb

## 2020-04-16 DIAGNOSIS — Z1501 Genetic susceptibility to malignant neoplasm of breast: Secondary | ICD-10-CM | POA: Diagnosis not present

## 2020-04-16 DIAGNOSIS — C50311 Malignant neoplasm of lower-inner quadrant of right female breast: Secondary | ICD-10-CM

## 2020-04-16 DIAGNOSIS — Z1509 Genetic susceptibility to other malignant neoplasm: Secondary | ICD-10-CM

## 2020-04-16 DIAGNOSIS — Z171 Estrogen receptor negative status [ER-]: Secondary | ICD-10-CM

## 2020-04-16 DIAGNOSIS — D649 Anemia, unspecified: Secondary | ICD-10-CM | POA: Diagnosis not present

## 2020-04-16 LAB — COMPREHENSIVE METABOLIC PANEL
Albumin: 3.5 (ref 3.5–5.0)
Calcium: 9.5 (ref 8.7–10.7)

## 2020-04-16 LAB — BASIC METABOLIC PANEL
BUN: 10 (ref 4–21)
CO2: 23 — AB (ref 13–22)
Chloride: 107 (ref 99–108)
Creatinine: 1.2 — AB (ref 0.5–1.1)
Glucose: 114
Potassium: 4.5 (ref 3.4–5.3)
Sodium: 138 (ref 137–147)

## 2020-04-16 LAB — CBC
MCV: 95 (ref 81–99)
RBC: 3.5 — AB (ref 3.87–5.11)

## 2020-04-16 LAB — CORRECTED CALCIUM (CC13): Calcium, Corrected: 10 (ref 8.4–10.2)

## 2020-04-16 LAB — CBC AND DIFFERENTIAL
HCT: 33 — AB (ref 36–46)
Hemoglobin: 10.7 — AB (ref 12.0–16.0)
Neutrophils Absolute: 2.6
Platelets: 92 — AB (ref 150–399)
WBC: 5

## 2020-04-16 LAB — HEPATIC FUNCTION PANEL
ALT: 21 (ref 7–35)
AST: 30 (ref 13–35)
Alkaline Phosphatase: 89 (ref 25–125)
Bilirubin, Total: 0.7

## 2020-04-16 LAB — PROTEIN, TOTAL: Total Protein: 6.1 g/dL — AB (ref 6.3–8.2)

## 2020-04-16 NOTE — Progress Notes (Signed)
[facility]  [address] [place],  [place]  [address] [phone]  Clinic Day:  04/16/2020  Referring physician: [name], MD  This document serves as a record of services personally performed by [name], MD. It was created on their behalf by Curry,Lauren E, a trained medical scribe. The creation of this record is based on the scribe's personal observations and the provider's statements to them.  CHIEF COMPLAINT:  CC: Stage IB triple negative breast cancer, status post 4 cycles of Adriamycin/cyclophosphamide  Current Treatment:   Weekly paclitaxel for 12 cycles   HISTORY OF PRESENT ILLNESS:  Linda [name] is a 50 y.o. female who we began seeing in February 2018 for evaluation of anemia.  Her anemia was felt to be secondary to iron deficiency and we recommended continuation of oral iron supplementation for a total of 6 months, as well as referral back to the gastroenterologist.  The patient also has a history of thrombocytopenia and had previously seen Dr. Bobby Rumpf in 2014.  The thrombocytopenia was felt to be secondary to mild chronic immune thrombocytopenic purpura (ITP).  Due to the patient's family history of malignancy, she underwent testing for hereditary cancer syndromes with the Myriad myRisk Hereditary Cancer Panel test.  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  She underwent a robotic hysterectomy and bilateral salpingo-oophorectomy in May 2018 with Dr. Everitt Amber.  Pathology was benign.  We also discussed the option of risk reducing bilateral mastectomy, but she has chosen to have close surveillance, so we recommended annual MRI breast, in addition to mammography.  She was placed on chemoprevention with raloxifene in September 2018.  CT abdomen and pelvis in August 2020 done for bilateral flank pain,  revealed a tiny left renal calculus, as well as probable hepatic cirrhosis without evidence of hepatic mass.  She was then referred to [facility] and is followed by Roosevelt Locks, CRNP in [place] for her liver cirrhosis.  She tested negative for hepatitis A, B, and C.  She received her hepatitis vaccines in 2020.  We saw her in September 2021 for a new diagnosis of stage IB (T1c N0 M0) triple negative right breast cancer.  She underwent screening bilateral mammogram on August 3rd which revealed possible masses in the right breast.  Diagnostic right mammogram and right breast ultrasound from August 18th confirmed suspicious masses in the right breast at 5 o'clock measuring 1.5 cm and 6 o'clock measuring 1.4 cm.  There was an indeterminate 4 mm with possible distortion in the outer right breast without sonographic correlate.  Right axillary ultrasound was normal.  She then underwent biopsies of both masses and surgical pathology from these procedures revealed  invasive ductal carcinoma, grade 3, with necrosis at 5 o'clock; and invasive ductal carcinoma, grade 1-2, with necrosis and focal myxoid change at 6 o'clock.  No DCIS was identified.  Breast prognostic profiles revealed HER2, and estrogen and progesterone receptors to be negative. Ki67 was 30% at the 5 o'clock mass, and 40% at 6 o'clock.  She underwent bilateral mastectomies on September 24th with Dr. Noberto Retort. Surgical pathology from this procedure revealed invasive ductal carcinoma, grade 3, with myxoid change, 19 mm, at 6 o'clock, and invasive ductal carcinoma, grade 3, and ductal carcinoma in situ, 19 mm, at 5 o'clock.  All margins were negative for invasive carcinoma or DCIS.  Two sentinel lymph nodes were negative for metastatic carcinoma (0/2). CT chest, abdomen and pelvis in September did not reveal any evidence of metastatic disease.  She started dose dense AC chemotherapy on October 25th , with plans to follow this with  weekly paclitaxel for 12 doses.  She had severe restless leg syndrome with oral corticosteroids.  At her visit on November 29th prior to a 3rd cycle of AC chemotherapy she had significantly worsened anemia with a hemoglobin of 8.7, so we decided to wait 3 weeks for her 4th cycle of chemotherapy.  Due to cytopenias, this had to be delayed at least 2 more weeks, but she finally got her final dose on January 4th.  Iron studies, B12 and folate were normal. Her anemia has slowly improved.  She started weekly paclitaxel on January 26th and has received 8 out of 12 planned doses.   She did have some expected pancytopenia.  She has pre-existing neuropathy of the feet and legs.    INTERVAL HISTORY:  Linda [name] is here for follow up after being admitted to the hospital for osteomyelitis of the left 2nd toe.  She was treated with empiric vancomycin and Zosyn.  Amputation was recommended but the patient refused.  Cultures returned with Klebsiella, sensitive to IV Rocephin, so long term antibiotics 2 g Q24H will be continued for the next 6 weeks.  She will follow up with Dr. Cannon Kettle of Podiatry in 2-3 weeks.  She did have some worsening neutropenia during her hospitalization. She was discharged on Sunday and has home health coming out to assist her.   She does note some bloody drainage this morning.  She has a boot of the left foot.  She has been continuing IV Rocephin at home and administers this herself.  She denies recurrent fever.  She does note some left sided rib pain since her fall.  She also notes pain of the bilateral lower extremities, right worse than left, but Doppler ultrasound was negative in the hospital.  She did receive 2 units of PRBC's while in the hospital when her hemoglobin dropped down to 7.6.  Her hemoglobin has increased to 10.7, her platelet count has increased from 76,000 to 92,000, and her white count is normal.  Chemistries are unremarkable except for a creatinine of 1.2 and a total protein of 6.1.  Her  blood sugars are doing much better now that the infection is being treated.  Blood glucose today is 114.  Her  appetite is good, and she has gained 15 pounds since her last visit.  She denies fever, chills or other signs of infection.  She denies nausea, vomiting, bowel issues, or abdominal pain.  She denies sore throat, cough, dyspnea, or chest pain.    REVIEW OF SYSTEMS:  Review of Systems  Constitutional: Negative.  Negative for appetite change, chills, fatigue, fever and unexpected weight change.  HENT:  Negative.   Eyes: Negative.   Respiratory: Negative.  Negative for chest tightness, cough, hemoptysis, shortness of breath and wheezing.   Cardiovascular: Negative.  Negative for chest pain, leg swelling and palpitations.  Gastrointestinal: Negative.  Negative for abdominal distention, abdominal pain, blood in stool, constipation, diarrhea, nausea and vomiting.  Endocrine: Negative.   Genitourinary: Negative.  Negative for difficulty urinating, dysuria, frequency and hematuria.   Musculoskeletal: Positive for gait problem (unsteady). Negative for arthralgias, back pain, flank pain and myalgias.       Left sided rib pain since her fall; pain of the lower extremities, right worse  than left  Skin: Negative.   Neurological: Positive for gait problem (unsteady). Negative for dizziness, extremity weakness, headaches, light-headedness, numbness, seizures and speech difficulty.  Hematological: Negative.   Psychiatric/Behavioral: Positive for depression (while in the hospital). Negative for sleep disturbance. The patient is nervous/anxious (while in the hospital).     VITALS:  Blood pressure 130/80, pulse (!) 105, temperature 98.5 F (36.9 C), temperature source Oral, resp. rate 18, height 5' 2"  (1.575 m), weight 212 lb 11.2 oz (96.5 kg), last menstrual period 06/13/2009, SpO2 97 %.  Wt Readings from Last 3 Encounters:  04/16/20 212 lb 11.2 oz (96.5 kg)  04/01/20 197 lb 1.3 oz (89.4 kg)  03/30/20  196 lb 1.6 oz (89 kg)    Body mass index is 38.9 kg/m.  Performance status (ECOG): 1 - Symptomatic but completely ambulatory  PHYSICAL EXAM:  Physical Exam Constitutional:      General: She is not in acute distress.    Appearance: Normal appearance. She is normal weight.  HENT:     Head: Normocephalic and atraumatic.     Comments: She still has ecchymosis of the left temple and bruising under the left eye. Eyes:     General: No scleral icterus.    Extraocular Movements: Extraocular movements intact.     Conjunctiva/sclera: Conjunctivae normal.     Pupils: Pupils are equal, round, and reactive to light.  Cardiovascular:     Rate and Rhythm: Regular rhythm. Tachycardia present.     Pulses: Normal pulses.     Heart sounds: Normal heart sounds. No murmur heard. No friction rub. No gallop.   Pulmonary:     Effort: Pulmonary effort is normal. No respiratory distress.     Breath sounds: Normal breath sounds.  Abdominal:     General: Bowel sounds are normal. There is no distension.     Palpations: Abdomen is soft. There is no hepatomegaly, splenomegaly or mass.     Tenderness: There is no abdominal tenderness.  Musculoskeletal:        General: Normal range of motion.     Cervical back: Normal range of motion and neck supple.     Right lower leg: Edema (trace to 1+) present.     Left lower leg: 2+ Edema present.     Comments: Bilateral calf tenderness with negative Homan's sign  Lymphadenopathy:     Cervical: No cervical adenopathy.  Skin:    General: Skin is warm and dry.  Neurological:     General: No focal deficit present.     Mental Status: She is alert and oriented to person, place, and time. Mental status is at baseline.  Psychiatric:        Mood and Affect: Mood normal.        Behavior: Behavior normal.        Thought Content: Thought content normal.        Judgment: Judgment normal.    LABS:   CBC Latest Ref Rng & Units 04/16/2020 03/30/2020 03/23/2020  WBC - 5.0 2.2  2.0  Hemoglobin 12.0 - 16.0 10.7(A) 9.9(A) 9.8(A)  Hematocrit 36 - 46 33(A) 30(A) 30(A)  Platelets 150 - 399 92(A) 104(A) 73(A)   CMP Latest Ref Rng & Units 04/16/2020 03/30/2020 03/23/2020  Glucose 70 - 99 mg/dL - - -  BUN 4 - 21 10 8 4   Creatinine 0.5 - 1.1 1.2(A) 0.5 0.6  Sodium 137 - 147 138 135(A) 136(A)  Potassium 3.4 - 5.3 4.5 3.5 3.7  Chloride 99 -  108 107 104 106  CO2 13 - 22 23(A) 21 24(A)  Calcium 8.7 - 10.7 9.5 8.5(A) 8.7  Total Protein 6.5 - 8.1 g/dL - - -  Total Bilirubin 0.3 - 1.2 mg/dL - - -  Alkaline Phos 25 - 125 89 95 81  AST 13 - 35 30 23 26   ALT 7 - 35 21 20 22       Lab Results  Component Value Date   TIBC 300 01/13/2020   IRONPCTSAT 24.3 01/13/2020   No results found for: LDH  STUDIES:   EXAM: 04/08/2020 LEFT FOOT - COMPLETE 3+ VIEW  COMPARISON:  June 30, 2017.  FINDINGS: There is lytic destruction seen involving the second distal phalanx concerning for osteomyelitis. Associated soft tissue swelling is noted joint spaces are intact. No other bony abnormality is noted.  IMPRESSION: Lytic destruction seen involving the second distal phalanx concerning for osteomyelitis.   EXAM: 04/08/2020 CT HEAD WITHOUT CONTRAST  TECHNIQUE: Contiguous axial images were obtained from the base of the skull through the vertex without intravenous contrast.  COMPARISON:  None.  FINDINGS: Brain: No evidence of acute infarction, hemorrhage, hydrocephalus, extra-axial collection or mass lesion/mass effect.  Vascular: No hyperdense vessel or unexpected calcification.  Skull: Normal. Negative for fracture or focal lesion.  Sinuses/Orbits: Small sinonasal polyp or mucous retention cyst within the right maxillary sinus. Paranasal sinuses are otherwise clear. Orbital structures within normal limits.  Other: None.  IMPRESSION: No acute intracranial findings.   EXAM: 04/08/2020 CT ANGIOGRAPHY CHEST WITH CONTRAST  TECHNIQUE: Multidetector CT imaging of the  chest was performed using the standard protocol during bolus administration of intravenous contrast. Multiplanar CT image reconstructions and MIPs were obtained to evaluate the vascular anatomy.  CONTRAST:  80 mL Isovue 370 IV contrast  COMPARISON:  09/27/2019  FINDINGS: Cardiovascular: Heart size within normal limits. No pericardial effusion. Satisfactory opacification of the pulmonary arteries to the segmental branch levels. No filling defect to suggest pulmonary embolism. Main pulmonary trunk is normal in caliber. Thoracic aorta is normal in course and caliber. No acute vascular findings. Left-sided chest port terminates within the right atrium.  Mediastinum/Nodes: No axillary, mediastinal, or hilar lymphadenopathy. Trachea and esophagus within normal limits. Unchanged subcentimeter right thyroid lobe nodule. Not clinically significant; no follow-up imaging recommended (ref: J Am Coll Radiol. 2015 Feb;12(2): 143-50).  Lungs/Pleura: Subtle mosaic attenuation of the mid to lower lung fields, likely accentuated by incomplete inspiration. Lungs are otherwise clear. No airspace consolidation. No pulmonary nodules or masses. No pleural effusion. No pneumothorax.  Upper Abdomen: 6 mm right gastric lymph node (previously measured 7 mm). Nodular morphology of the liver. No focal liver abnormality is identified. No acute findings within the visualized upper abdomen.  Musculoskeletal: Postsurgical changes from bilateral mastectomies. Thin fluid collection within the surgical bed of the right breast, likely postoperative seroma. Approximate measurements are 5.0 x 0.6 x 4.6 cm. No acute osseous abnormality. No suspicious bone lesion.  Review of the MIP images confirms the above findings.  IMPRESSION: 1. Negative for pulmonary embolism. 2. Subtle mosaic attenuation of the mid to lower lung fields, likely air trapping accentuated by low lung volumes. Lungs are otherwise clear. 3. No  acute findings within the chest. No evidence of metastatic disease. 4. Bilateral mastectomies with thin postoperative seroma within the surgical bed of the right breast. 5. Nodular morphology of the liver, which can be seen with cirrhosis.   EXAM: LEFT LOWER EXTREMITY VENOUS DOPPLER ULTRASOUND  TECHNIQUE: Gray-scale sonography with compression, as well  as color and duplex ultrasound, were performed to evaluate the deep venous system(s) from the level of the common femoral vein through the popliteal and proximal calf veins.  COMPARISON:  None.  FINDINGS: VENOUS  Normal compressibility of the LEFT common femoral, superficial femoral, and popliteal veins, as well as the visualized LEFT calf veins. Visualized portions of profunda femoral vein and great saphenous vein unremarkable. No filling defects to suggest DVT on grayscale or color Doppler imaging. Doppler waveforms show normal direction of venous flow, normal respiratory plasticity and response to augmentation.  Limited views of the contralateral common femoral vein are unremarkable.  OTHER  None.  Limitations: none  IMPRESSION: Negative.   HISTORY:   Allergies:  Allergies  Allergen Reactions  . Codeine Shortness Of Breath    Other reaction(s): SHOB  . Celecoxib Other (See Comments)    Unknown reaction Other reaction(s): Unknown  . Ezetimibe     Other reaction(s): Unknown  . Ezetimibe-Simvastatin Other (See Comments)    Unknown reaction  . Propranolol     Other reaction(s): Unknown  . Propranolol Hcl Other (See Comments)    Unknown reaction  . Simvastatin     Other reaction(s): Unknown    Current Medications: Current Outpatient Medications  Medication Sig Dispense Refill  . cetirizine (ZYRTEC) 10 MG tablet 10 mg.    . Magnesium 500 MG CAPS 500 mg.    . potassium chloride (KLOR-CON) 20 MEQ packet 20 mEq in the morning and at bedtime.    . benzonatate (TESSALON) 100 MG capsule Take 1 capsule (100  mg total) by mouth 3 (three) times daily as needed for cough. 30 capsule 0  . Blood Glucose Monitoring Suppl (ONETOUCH VERIO REFLECT) w/Device KIT AS DIRECTED    . buPROPion (WELLBUTRIN XL) 300 MG 24 hr tablet TAKE ONE TABLET BY MOUTH EVERY EVENING 90 tablet 1  . calcium citrate-vitamin D (CITRACAL+D) 315-200 MG-UNIT tablet Take 1 tablet by mouth 2 (two) times daily.    . cefTRIAXone (ROCEPHIN) 10 g injection     . clotrimazole-betamethasone (LOTRISONE) cream Apply small amount to affected area twice daily (Patient taking differently: as needed. Apply small amount to affected area twice daily) 45 g 0  . Continuous Blood Gluc Receiver (FREESTYLE LIBRE 2 READER) DEVI E11.69 Check blood sugar 4 times daily as directed 1 each 0  . Continuous Blood Gluc Sensor (FREESTYLE LIBRE 2 SENSOR) MISC E11.69 Change sensor every 14 days as directed 6 each 3  . cyclobenzaprine (FLEXERIL) 10 MG tablet TAKE ONE TABLET BY MOUTH EVERY 8 HOURS AS NEEDED FOR MUSCLE SPASMS 30 tablet 3  . dicyclomine (BENTYL) 20 MG tablet TAKE ONE TABLET BY MOUTH BEFORE MEALS AND AT BEDTIME AS NEEDED FOR STOMACH CRAMPING 180 tablet 1  . Dulaglutide (TRULICITY) 2.53 GU/4.4IH SOPN INJECT ONE SYRINGE ONCE A WEEK 6 mL 1  . Empagliflozin-metFORMIN HCl (SYNJARDY) 12.05-998 MG TABS Take 1 tablet by mouth 2 (two) times daily. 180 tablet 0  . famotidine (PEPCID) 20 MG tablet TAKE ONE TABLET BY MOUTH TWICE DAILY 180 tablet 1  . ferrous sulfate 325 (65 FE) MG tablet Take 325 mg by mouth every evening.    Marland Kitchen FETZIMA 80 MG CP24 TAKE ONE CAPSULE BY MOUTH ONCE DAILY 90 capsule 1  . gabapentin (NEURONTIN) 300 MG capsule Take 1 capsule (300 mg total) by mouth 3 (three) times daily. 270 capsule 0  . glucose blood test strip 1 each by Other route in the morning and at bedtime. One Touch Verio -  Patient checks blood sugar 2-3 times daily. 100 each 2  . insulin aspart (NOVOLOG FLEXPEN) 100 UNIT/ML FlexPen Three times before meals while on prednisone. 15 mL 11   . Insulin Pen Needle (BD PEN NEEDLE NANO U/F) 32G X 4 MM MISC Inject 1 each into the skin in the morning and at bedtime. 200 each 3  . LANTUS SOLOSTAR 100 UNIT/ML Solostar Pen INJECT 68 UNITS SUBCUTANEOUSLY EVERYDAY AT BEDTIME 15 mL 2  . levothyroxine (SYNTHROID) 75 MCG tablet TAKE ONE TABLET BY MOUTH ONCE DAILY 90 tablet 1  . loratadine (CLARITIN) 10 MG tablet Take 10 mg by mouth daily. Taking 3-4 days each week due to bone pain associated with shot from cancer center.    Marland Kitchen LORazepam (ATIVAN) 0.5 MG tablet TAKE ONE TABLET BY MOUTH TWICE DAILY AS NEEDED FOR ANXIETY 30 tablet 2  . losartan (COZAAR) 50 MG tablet TAKE ONE TABLET BY MOUTH EVERY EVENING 90 tablet 1  . morphine (MS CONTIN) 30 MG 12 hr tablet TAKE ONE TABLET BY MOUTH EVERY TWELVE HOURS 60 tablet 0  . Multiple Vitamin (MULTIVITAMIN WITH MINERALS) TABS tablet Take 1 tablet by mouth daily.     . ondansetron (ZOFRAN) 4 MG tablet Take 1 tablet (4 mg total) by mouth every 4 (four) hours as needed for nausea. 90 tablet 3  . OneTouch Delica Lancets 40H MISC 1 each by Does not apply route daily before breakfast. Check blood sugar twice daily. 100 each 3  . pantoprazole (PROTONIX) 40 MG tablet TAKE ONE TABLET BY MOUTH TWICE DAILY 180 tablet 1  . Pegfilgrastim-jmdb (FULPHILA [place]) Inject into the skin.    . pravastatin (PRAVACHOL) 20 MG tablet Take 20 mg by mouth at bedtime.    . prochlorperazine (COMPAZINE) 10 MG tablet Take 1 tablet (10 mg total) by mouth every 6 (six) hours as needed for nausea or vomiting. 90 tablet 3  . senna (SENOKOT) 8.6 MG tablet Take 1 tablet by mouth daily as needed for constipation.    . SODIUM FLUORIDE 5000 SENSITIVE 1.1-5 % GEL Take by mouth 2 (two) times daily.    Marland Kitchen Specialty Vitamins Products (MAGNESIUM, AMINO ACID CHELATE,) 133 MG tablet Take 1 tablet by mouth at bedtime.    . sucralfate (CARAFATE) 1 g tablet Take 1 tablet (1 g total) by mouth 4 (four) times daily -  with meals and at bedtime. 270 tablet 1  . Vitamin  D, Ergocalciferol, (DRISDOL) 1.25 MG (50000 UNIT) CAPS capsule TAKE ONE CAPSULE BY MOUTH ONCE WEEKLY ON FRIDAY 12 capsule 1  . zolpidem (AMBIEN) 10 MG tablet TAKE ONE TABLET BY MOUTH EVERYDAY AT BEDTIME AS NEEDED 30 tablet 5   No current facility-administered medications for this visit.     ASSESSMENT & PLAN:   Assessment: 1. Positive BRCA 1 mutation, which increases her risk for breast and ovarian cancer, as well as elevates her risk for pancreatic cancer, for which she underwent hysterectomy/bilateral salpingo-oophorectomy and is on chemoprevention with raloxifene and regular surveillance.   She initially declined bilateral prophylactic mastectomies.   2.  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  She has undergone bilateral mastectomies.  She completed 4 cycles of Adriamycin/cyclophosphamide chemotherapy and has now received 8 out of 12 planned weeks of weekly paclitaxel.  3.  Triple negative stage IB invasive ductal carcinoma, grade 1-2, with necrosis and focal myxoid changes at 6  o'clock in the right breast, diagnosed in August 2021.  We feel this is a separate primary.  She has undergone bilateral mastectomies.  She completed 4 cycles of Adriamycin/cyclophosphamide chemotherapy and was receiving weekly paclitaxel but we will plan to discontinue this.  She has received 8 cycles.  4. Thrombocytopenia, which is felt to be due to chronic ITP. This has worsened with chemotherapy.  We will continue to monitor this.  She knows to contact us has abnormal bleeding or bruising.  5. Liver cirrhosis as seen on CT imaging in August 2020.  She has been referred and is being seen by Roosevelt Locks, CRNP, at the [facility] in [place].  Hepatitis panel was negative and she has received the appropriate vaccines.  Abdominal ultrasound from April 2021 was  stable and CT imaging from September 2021 was stable.  She will be due for repeat abdominal ultrasound in April.  6. Anemia secondary to chemotherapy, which has improved after receiving 2 units of PRBC's while in the hospital.    7. Diabetes, which is under better control with treating the osteomyelitis.  8. Hypokalemia, resolved potassium chloride 20 mEq BID.  We may be able to decrease the dose by next time.  9. Neutropenia secondary to chemotherapy, resolved.  10.  Severe worsening neuropathy.  We reduced her dose by 15-20%.    11.  Osteomyelitis of the left 2nd toe, being treated with long term antibiotics Rocephin IV 2 g Q24H for the next 6 weeks.  She will follow up with Dr. Cannon Kettle of Podiatry in 2-3 weeks.  12.  Extensive edema and pain of the lower extremities.  Doppler ultrasound was negative.  Plan:   As she will be on extensive course of antibiotics, we will plan to discontinue weekly paclitaxel as she has received 8 of the 12 planned doses and we do not want to risk aggravating her infection with neutropenia.  At this time, we will continue to monitor her routinely.   We will plan to see her back in 1 month with a CBC and comprehensive metabolic panel for re-examination to recheck her blood counts and see if her potassium dose can be reduced.  She will also be due for 6 month ultrasound of the liver to follow her cirrhosis.  If all is well following that appointment, we can go to 3 month follow up and port flush.  The patient understands the plans discussed today and is in agreement with them.  She knows to contact our office if she develops concerns prior to her next appointment.   Derwood Kaplan, MD [facility] [address] [place] [address] Dept: [phone] Dept Fax: [phone]   I, Rita Ohara, am acting as scribe for Derwood Kaplan, MD  I have reviewed this report as typed by the  medical scribe, and it is complete and accurate.  Hermina Barters

## 2020-04-16 NOTE — Telephone Encounter (Signed)
Per 3/24 los next appt scheduled and given to patient

## 2020-04-17 ENCOUNTER — Ambulatory Visit: Payer: PPO | Admitting: Sports Medicine

## 2020-04-17 ENCOUNTER — Encounter: Payer: Self-pay | Admitting: Sports Medicine

## 2020-04-17 DIAGNOSIS — J9589 Other postprocedural complications and disorders of respiratory system, not elsewhere classified: Secondary | ICD-10-CM | POA: Insufficient documentation

## 2020-04-17 DIAGNOSIS — N179 Acute kidney failure, unspecified: Secondary | ICD-10-CM

## 2020-04-17 DIAGNOSIS — L97524 Non-pressure chronic ulcer of other part of left foot with necrosis of bone: Secondary | ICD-10-CM

## 2020-04-17 DIAGNOSIS — R600 Localized edema: Secondary | ICD-10-CM

## 2020-04-17 DIAGNOSIS — Z1501 Genetic susceptibility to malignant neoplasm of breast: Secondary | ICD-10-CM | POA: Insufficient documentation

## 2020-04-17 DIAGNOSIS — S80811A Abrasion, right lower leg, initial encounter: Secondary | ICD-10-CM

## 2020-04-17 DIAGNOSIS — M869 Osteomyelitis, unspecified: Secondary | ICD-10-CM | POA: Insufficient documentation

## 2020-04-17 HISTORY — DX: Other postprocedural complications and disorders of respiratory system, not elsewhere classified: J95.89

## 2020-04-17 HISTORY — DX: Acute kidney failure, unspecified: N17.9

## 2020-04-17 NOTE — Progress Notes (Signed)
Subjective: Linda [name] is a 50 y.o. female patient seen in office for evaluation of ulceration of the left 2nd toe after discharge from [facility] on antibiotics. Patient has a history of diabetes and a blood glucose level  today 170m/dl, Denies nausea/fever/vomiting/chills/night sweats/shortness of breath but admits some soreness to right leg with red itchy areas. Patient has no other pedal complaints at this time.  Patient is on picc line antibiotics at home and has home nursing changing the dressings.   Patient Active Problem List   Diagnosis Date Noted  . Acute postoperative respiratory insufficiency 04/17/2020  . AKI (acute kidney injury) ([facility] 04/17/2020  . 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 Osteomyelitis ([place] 04/17/2020  . Diabetic ulcer of toe of left foot associated with type 2 diabetes mellitus, with fat layer exposed ([facility] 03/27/2020  . Dehydration 03/16/2020  . Hypokalemia 03/09/2020  . Chemotherapy-induced thrombocytopenia 02/06/2020  . Thrombocytopenia, unspecified ([place] 01/27/2020  . Malignant neoplasm of lower-inner quadrant of right breast of female, estrogen receptor negative ([place] 10/01/2019  . Chronic pain syndrome 07/08/2019  . Memory loss 07/08/2019  . Depression, major, recurrent, mild ([facility] 07/08/2019  . Uncomplicated opioid dependence ([facility] 07/08/2019  . Mixed hyperlipidemia 04/11/2019  . Dyslipidemia associated with type 2 diabetes mellitus ([facility] 04/11/2019  . Essential hypertension, benign 04/11/2019  . Major depressive disorder, single episode, mild ([place] 04/11/2019  . Acquired hypothyroidism 04/11/2019  . Vitamin D insufficiency 04/11/2019  . Class 2 severe obesity due to excess calories with serious comorbidity and body mass index (BMI) of 35.0 to 35.9 in adult ([facility] 04/11/2019  . Pre-ulcerative calluses 03/04/2019  . Liver cirrhosis secondary to NASH (nonalcoholic steatohepatitis) ([facility] 09/13/2018    Class: Chronic   . BRCA1 positive 06/14/2016   Current Outpatient Medications on File Prior to Visit  Medication Sig Dispense Refill  . benzonatate (TESSALON) 100 MG capsule Take 1 capsule (100 mg total) by mouth 3 (three) times daily as needed for cough. 30 capsule 0  . Blood Glucose Monitoring Suppl (ONETOUCH VERIO REFLECT) w/Device KIT AS DIRECTED    . buPROPion (WELLBUTRIN XL) 300 MG 24 hr tablet TAKE ONE TABLET BY MOUTH EVERY EVENING 90 tablet 1  . calcium citrate-vitamin D (CITRACAL+D) 315-200 MG-UNIT tablet Take 1 tablet by mouth 2 (two) times daily.    . cefTRIAXone (ROCEPHIN) 10 g injection     . cetirizine (ZYRTEC) 10 MG tablet 10 mg.    . clotrimazole-betamethasone (LOTRISONE) cream Apply small amount to affected area twice daily (Patient taking differently: as needed. Apply small amount to affected area twice daily) 45 g 0  . Continuous Blood Gluc Receiver (FREESTYLE LIBRE 2 READER) DEVI E11.69 Check blood sugar 4 times daily as directed 1 each 0  . Continuous Blood Gluc Sensor (FREESTYLE LIBRE 2 SENSOR) MISC E11.69 Change sensor every 14 days as directed 6 each 3  . cyclobenzaprine (FLEXERIL) 10 MG tablet TAKE ONE TABLET BY MOUTH EVERY 8 HOURS AS NEEDED FOR MUSCLE SPASMS 30 tablet 3  . dicyclomine (BENTYL) 20 MG tablet TAKE ONE TABLET BY MOUTH BEFORE MEALS AND AT BEDTIME AS NEEDED FOR STOMACH CRAMPING 180 tablet 1  . Dulaglutide (TRULICITY) 08.33MAS/5.0NLSOPN INJECT ONE SYRINGE ONCE A WEEK 6 mL 1  . Empagliflozin-metFORMIN HCl (SYNJARDY) 12.05-998 MG TABS Take 1 tablet by mouth 2 (two) times daily. 180 tablet 0  . famotidine (PEPCID) 20 MG tablet TAKE ONE TABLET BY MOUTH TWICE DAILY 180 tablet 1  . ferrous sulfate 325 (65  FE) MG tablet Take 325 mg by mouth every evening.    Marland Kitchen FETZIMA 80 MG CP24 TAKE ONE CAPSULE BY MOUTH ONCE DAILY 90 capsule 1  . gabapentin (NEURONTIN) 300 MG capsule Take 1 capsule (300 mg total) by mouth 3 (three) times daily. 270 capsule 0  . glucose blood test strip 1 each by  Other route in the morning and at bedtime. One Touch Verio - Patient checks blood sugar 2-3 times daily. 100 each 2  . insulin aspart (NOVOLOG FLEXPEN) 100 UNIT/ML FlexPen Three times before meals while on prednisone. 15 mL 11  . Insulin Pen Needle (BD PEN NEEDLE NANO U/F) 32G X 4 MM MISC Inject 1 each into the skin in the morning and at bedtime. 200 each 3  . LANTUS SOLOSTAR 100 UNIT/ML Solostar Pen INJECT 68 UNITS SUBCUTANEOUSLY EVERYDAY AT BEDTIME 15 mL 2  . levothyroxine (SYNTHROID) 75 MCG tablet TAKE ONE TABLET BY MOUTH ONCE DAILY 90 tablet 1  . loratadine (CLARITIN) 10 MG tablet Take 10 mg by mouth daily. Taking 3-4 days each week due to bone pain associated with shot from cancer center.    Marland Kitchen LORazepam (ATIVAN) 0.5 MG tablet TAKE ONE TABLET BY MOUTH TWICE DAILY AS NEEDED FOR ANXIETY 30 tablet 2  . losartan (COZAAR) 50 MG tablet TAKE ONE TABLET BY MOUTH EVERY EVENING 90 tablet 1  . Magnesium 500 MG CAPS 500 mg.    . morphine (MS CONTIN) 30 MG 12 hr tablet TAKE ONE TABLET BY MOUTH EVERY TWELVE HOURS 60 tablet 0  . Multiple Vitamin (MULTIVITAMIN WITH MINERALS) TABS tablet Take 1 tablet by mouth daily.     . ondansetron (ZOFRAN) 4 MG tablet Take 1 tablet (4 mg total) by mouth every 4 (four) hours as needed for nausea. 90 tablet 3  . OneTouch Delica Lancets 59D MISC 1 each by Does not apply route daily before breakfast. Check blood sugar twice daily. 100 each 3  . pantoprazole (PROTONIX) 40 MG tablet TAKE ONE TABLET BY MOUTH TWICE DAILY 180 tablet 1  . Pegfilgrastim-jmdb (FULPHILA Candler) Inject into the skin.    . potassium chloride (KLOR-CON) 20 MEQ packet 20 mEq in the morning and at bedtime.    . pravastatin (PRAVACHOL) 20 MG tablet Take 20 mg by mouth at bedtime.    . prochlorperazine (COMPAZINE) 10 MG tablet Take 1 tablet (10 mg total) by mouth every 6 (six) hours as needed for nausea or vomiting. 90 tablet 3  . senna (SENOKOT) 8.6 MG tablet Take 1 tablet by mouth daily as needed for  constipation.    . SODIUM FLUORIDE 5000 SENSITIVE 1.1-5 % GEL Take by mouth 2 (two) times daily.    Marland Kitchen Specialty Vitamins Products (MAGNESIUM, AMINO ACID CHELATE,) 133 MG tablet Take 1 tablet by mouth at bedtime.    . sucralfate (CARAFATE) 1 g tablet Take 1 tablet (1 g total) by mouth 4 (four) times daily -  with meals and at bedtime. 270 tablet 1  . Vitamin D, Ergocalciferol, (DRISDOL) 1.25 MG (50000 UNIT) CAPS capsule TAKE ONE CAPSULE BY MOUTH ONCE WEEKLY ON FRIDAY 12 capsule 1  . zolpidem (AMBIEN) 10 MG tablet TAKE ONE TABLET BY MOUTH EVERYDAY AT BEDTIME AS NEEDED 30 tablet 5   No current facility-administered medications on file prior to visit.   Allergies  Allergen Reactions  . Codeine Shortness Of Breath    Other reaction(s): SHOB  . Celecoxib Other (See Comments)    Unknown reaction Other reaction(s): Unknown  .  Ezetimibe     Other reaction(s): Unknown  . Ezetimibe-Simvastatin Other (See Comments)    Unknown reaction  . Propranolol     Other reaction(s): Unknown  . Propranolol Hcl Other (See Comments)    Unknown reaction  . Simvastatin     Other reaction(s): Unknown    Recent Results (from the past 2160 hour(s))  CBC and differential     Status: Abnormal   Collection Time: 01/20/20 12:00 AM  Result Value Ref Range   Hemoglobin 9.3 (A) 12.0 - 16.0   HCT 29 (A) 36 - 46   Neutrophils Absolute 2.71    Platelets 83 (A) 150 - 399   WBC 4.3   CBC     Status: Abnormal   Collection Time: 01/20/20 12:00 AM  Result Value Ref Range   RBC 3.07 (A) 3.87 - 3.90  Basic metabolic panel     Status: Abnormal   Collection Time: 01/20/20 12:00 AM  Result Value Ref Range   Glucose 331    BUN 15 4 - 21   CO2 23 (A) 13 - 22   Creatinine 0.8 0.5 - 1.1   Potassium 4.0 3.4 - 5.3   Sodium 136 (A) 137 - 147   Chloride 100 99 - 108  Comprehensive metabolic panel     Status: None   Collection Time: 01/20/20 12:00 AM  Result Value Ref Range   Calcium 8.9 8.7 - 10.7   Albumin 3.6 3.5 -  5.0  Hepatic function panel     Status: None   Collection Time: 01/20/20 12:00 AM  Result Value Ref Range   Alkaline Phosphatase 92 25 - 125   ALT 18 7 - 35   AST 25 13 - 35   Bilirubin, Total 0.8   CBC and differential     Status: Abnormal   Collection Time: 01/27/20 12:00 AM  Result Value Ref Range   Hemoglobin 10.4 (A) 12.0 - 16.0   HCT 32 (A) 36 - 46   Neutrophils Absolute 3.14    Platelets 77 (A) 150 - 399   WBC 4.9   CBC     Status: Abnormal   Collection Time: 01/27/20 12:00 AM  Result Value Ref Range   RBC 3.33 (A) 3.87 - 3.00  Basic metabolic panel     Status: Abnormal   Collection Time: 01/27/20 12:00 AM  Result Value Ref Range   Glucose 298    BUN 8 4 - 21   CO2 22 13 - 22   Creatinine 0.7 0.5 - 1.1   Potassium 3.5 3.4 - 5.3   Sodium 134 (A) 137 - 147   Chloride 102 99 - 108  Comprehensive metabolic panel     Status: None   Collection Time: 01/27/20 12:00 AM  Result Value Ref Range   Calcium 9.5 8.7 - 10.7   Albumin 3.8 3.5 - 5.0  Hepatic function panel     Status: None   Collection Time: 01/27/20 12:00 AM  Result Value Ref Range   Alkaline Phosphatase 99 25 - 125   ALT 25 7 - 35   AST 35 13 - 35   Bilirubin, Total 0.6   CBC and differential     Status: Abnormal   Collection Time: 02/06/20 12:00 AM  Result Value Ref Range   Hemoglobin 8.6 (A) 12.0 - 16.0   HCT 26 (A) 36 - 46   Neutrophils Absolute 0.39    Platelets 12 (A) 150 - 399   WBC  1.0   CBC     Status: Abnormal   Collection Time: 02/06/20 12:00 AM  Result Value Ref Range   RBC 2.8 (A) 3.87 - 3.00  Basic metabolic panel     Status: Abnormal   Collection Time: 02/06/20 12:00 AM  Result Value Ref Range   Glucose 309    BUN 10 4 - 21   CO2 24 (A) 13 - 22   Creatinine 0.7 0.5 - 1.1   Potassium 3.8 3.4 - 5.3   Sodium 135 (A) 137 - 147   Chloride 102 99 - 108  Comprehensive metabolic panel     Status: None   Collection Time: 02/06/20 12:00 AM  Result Value Ref Range   Calcium 9.0 8.7 - 10.7    Albumin 3.8 3.5 - 5.0  Hepatic function panel     Status: None   Collection Time: 02/06/20 12:00 AM  Result Value Ref Range   Alkaline Phosphatase 90 25 - 125   ALT 18 7 - 35   AST 18 13 - 35   Bilirubin, Total 0.7   CBC and differential     Status: Abnormal   Collection Time: 02/12/20 12:00 AM  Result Value Ref Range   Hemoglobin 9.1 (A) 12.0 - 16.0   HCT 27 (A) 36 - 46   Neutrophils Absolute 4.00    Platelets 124 (A) 150 - 399   WBC 5.4   CBC     Status: Abnormal   Collection Time: 02/12/20 12:00 AM  Result Value Ref Range   RBC 2.91 (A) 3.87 - 9.23  Basic metabolic panel     Status: Abnormal   Collection Time: 02/12/20 12:00 AM  Result Value Ref Range   Glucose 361    BUN 9 4 - 21   CO2 26 (A) 13 - 22   Creatinine 0.7 0.5 - 1.1   Potassium 4.4 3.4 - 5.3   Sodium 135 (A) 137 - 147   Chloride 101 99 - 108  Comprehensive metabolic panel     Status: None   Collection Time: 02/12/20 12:00 AM  Result Value Ref Range   Calcium 9.6 8.7 - 10.7   Albumin 3.9 3.5 - 5.0  Hepatic function panel     Status: None   Collection Time: 02/12/20 12:00 AM  Result Value Ref Range   Alkaline Phosphatase 113 25 - 125   ALT 21 7 - 35   AST 30 13 - 35   Bilirubin, Total 0.7   CBC and differential     Status: Abnormal   Collection Time: 02/17/20 12:00 AM  Result Value Ref Range   Hemoglobin 9.6 (A) 12.0 - 16.0   HCT 29 (A) 36 - 46   Neutrophils Absolute 4.47    Platelets 113 (A) 150 - 399   WBC 5.8   CBC     Status: Abnormal   Collection Time: 02/17/20 12:00 AM  Result Value Ref Range   RBC 3.04 (A) 3.87 - 3.00  Basic metabolic panel     Status: Abnormal   Collection Time: 02/17/20 12:00 AM  Result Value Ref Range   Glucose 454    BUN 10 4 - 21   CO2 28 (A) 13 - 22   Creatinine 0.8 0.5 - 1.1   Potassium 4.0 3.4 - 5.3   Sodium 133 (A) 137 - 147   Chloride 97 (A) 99 - 108  Comprehensive metabolic panel     Status: None   Collection  Time: 02/17/20 12:00 AM  Result Value Ref  Range   Calcium 9.7 8.7 - 10.7   Albumin 4.1 3.5 - 5.0  Hepatic function panel     Status: None   Collection Time: 02/17/20 12:00 AM  Result Value Ref Range   Alkaline Phosphatase 115 25 - 125   ALT 22 7 - 35   AST 31 13 - 35   Bilirubin, Total 0.8   CBC and differential     Status: Abnormal   Collection Time: 02/24/20 12:00 AM  Result Value Ref Range   Hemoglobin 10.1 (A) 12.0 - 16.0   HCT 30 (A) 36 - 46   Neutrophils Absolute 4.24    Platelets 130 (A) 150 - 399   WBC 5.5   CBC     Status: Abnormal   Collection Time: 02/24/20 12:00 AM  Result Value Ref Range   RBC 3.17 (A) 3.87 - 5.11  CBC     Status: None   Collection Time: 02/24/20 12:00 AM  Result Value Ref Range   MCV 95 81 - 99  Basic metabolic panel     Status: Abnormal   Collection Time: 02/24/20 12:00 AM  Result Value Ref Range   Glucose 199    BUN 12 4 - 21   CO2 24 (A) 13 - 22   Creatinine 0.8 0.5 - 1.1   Potassium 4.1 3.4 - 5.3   Sodium 136 (A) 137 - 147   Chloride 102 99 - 108  Comprehensive metabolic panel     Status: None   Collection Time: 02/24/20 12:00 AM  Result Value Ref Range   Calcium 9.3 8.7 - 10.7   Albumin 4.1 3.5 - 5.0  Hepatic function panel     Status: None   Collection Time: 02/24/20 12:00 AM  Result Value Ref Range   Alkaline Phosphatase 86 25 - 125   ALT 24 7 - 35   AST 34 13 - 35   Bilirubin, Total 0.6   CBC and differential     Status: Abnormal   Collection Time: 03/02/20 12:00 AM  Result Value Ref Range   Hemoglobin 10.5 (A) 12.0 - 16.0   HCT 32 (A) 36 - 46   Neutrophils Absolute 1.89    Platelets 70 (A) 150 - 399   WBC 3.0   CBC     Status: Abnormal   Collection Time: 03/02/20 12:00 AM  Result Value Ref Range   RBC 3.32 (A) 3.87 - 9.93  Basic metabolic panel     Status: Abnormal   Collection Time: 03/02/20 12:00 AM  Result Value Ref Range   Glucose 169    BUN 10 4 - 21   CO2 26 (A) 13 - 22   Creatinine 0.7 0.5 - 1.1   Potassium 3.3 (A) 3.4 - 5.3   Sodium 138 137 -  147   Chloride 104 99 - 108  Comprehensive metabolic panel     Status: None   Collection Time: 03/02/20 12:00 AM  Result Value Ref Range   Calcium 8.8 8.7 - 10.7   Albumin 3.9 3.5 - 5.0  Hepatic function panel     Status: None   Collection Time: 03/02/20 12:00 AM  Result Value Ref Range   Alkaline Phosphatase 77 25 - 125   ALT 22 7 - 35   AST 35 13 - 35   Bilirubin, Total 0.7   CBC     Status: None   Collection Time: 03/02/20 12:00  AM  Result Value Ref Range   MCV 96 81 - 99  Basic metabolic panel     Status: Abnormal   Collection Time: 03/09/20 12:00 AM  Result Value Ref Range   Potassium 2.8 (A) 3.4 - 5.3   Sodium 136 (A) 137 - 191  Basic metabolic panel     Status: Abnormal   Collection Time: 03/09/20 12:00 AM  Result Value Ref Range   Glucose 140    BUN 13 4 - 21   CO2 24 (A) 13 - 22   Creatinine 0.7 0.5 - 1.1   Chloride 102 99 - 108  Comprehensive metabolic panel     Status: Abnormal   Collection Time: 03/09/20 12:00 AM  Result Value Ref Range   Calcium 8.6 (A) 8.7 - 10.7   Albumin 4.2 3.5 - 5.0  Hepatic function panel     Status: None   Collection Time: 03/09/20 12:00 AM  Result Value Ref Range   Alkaline Phosphatase 103 25 - 125   ALT 22 7 - 35   AST 27 13 - 35   Bilirubin, Total 0.9   CBC and differential     Status: Abnormal   Collection Time: 03/09/20 12:00 AM  Result Value Ref Range   Hemoglobin 11.1 (A) 12.0 - 16.0   HCT 33 (A) 36 - 46   Neutrophils Absolute 1.63    Platelets 67 (A) 150 - 399   WBC 2.5   CBC     Status: Abnormal   Collection Time: 03/09/20 12:00 AM  Result Value Ref Range   RBC 3.41 (A) 3.87 - 5.11  CBC     Status: None   Collection Time: 03/09/20 12:00 AM  Result Value Ref Range   MCV 96 81 - 99  CBC and differential     Status: Abnormal   Collection Time: 03/16/20 12:00 AM  Result Value Ref Range   Hemoglobin 11.5 (A) 12.0 - 16.0   HCT 34 (A) 36 - 46   Neutrophils Absolute 1.85    Platelets 140 (A) 150 - 399   WBC 3.3    CBC     Status: Abnormal   Collection Time: 03/16/20 12:00 AM  Result Value Ref Range   RBC 3.56 (A) 3.87 - 4.78  Basic metabolic panel     Status: Abnormal   Collection Time: 03/16/20 12:00 AM  Result Value Ref Range   Glucose 282    BUN 14 4 - 21   CO2 26 (A) 13 - 22   Creatinine 1.0 0.5 - 1.1   Potassium 3.8 3.4 - 5.3   Sodium 131 (A) 137 - 147   Chloride 96 (A) 99 - 108  Comprehensive metabolic panel     Status: None   Collection Time: 03/16/20 12:00 AM  Result Value Ref Range   Calcium 8.8 8.7 - 10.7   Albumin 4.0 3.5 - 5.0  Hepatic function panel     Status: Abnormal   Collection Time: 03/16/20 12:00 AM  Result Value Ref Range   Alkaline Phosphatase 103 25 - 125   ALT 23 7 - 35   AST 40 (A) 13 - 35   Bilirubin, Total 1.0   CBC and differential     Status: Abnormal   Collection Time: 03/23/20 12:00 AM  Result Value Ref Range   Hemoglobin 9.8 (A) 12.0 - 16.0   HCT 30 (A) 36 - 46   Neutrophils Absolute 1.22    Platelets 73 (A)  150 - 399   WBC 2.0   CBC     Status: Abnormal   Collection Time: 03/23/20 12:00 AM  Result Value Ref Range   RBC 3.02 (A) 3.87 - 7.12  Basic metabolic panel     Status: Abnormal   Collection Time: 03/23/20 12:00 AM  Result Value Ref Range   Glucose 262    BUN 4 4 - 21   CO2 24 (A) 13 - 22   Creatinine 0.6 0.5 - 1.1   Potassium 3.7 3.4 - 5.3   Sodium 136 (A) 137 - 147   Chloride 106 99 - 108  Comprehensive metabolic panel     Status: None   Collection Time: 03/23/20 12:00 AM  Result Value Ref Range   Calcium 8.7 8.7 - 10.7   Albumin 3.6 3.5 - 5.0  Hepatic function panel     Status: None   Collection Time: 03/23/20 12:00 AM  Result Value Ref Range   Alkaline Phosphatase 81 25 - 125   ALT 22 7 - 35   AST 26 13 - 35   Bilirubin, Total 0.8   CBC and differential     Status: Abnormal   Collection Time: 03/30/20 12:00 AM  Result Value Ref Range   Hemoglobin 9.9 (A) 12.0 - 16.0   HCT 30 (A) 36 - 46   Neutrophils Absolute 1.23     Platelets 104 (A) 150 - 399   WBC 2.2   CBC     Status: Abnormal   Collection Time: 03/30/20 12:00 AM  Result Value Ref Range   RBC 3.11 (A) 3.87 - 4.58  Basic metabolic panel     Status: Abnormal   Collection Time: 03/30/20 12:00 AM  Result Value Ref Range   Glucose 174    BUN 8 4 - 21   CO2 21 13 - 22   Creatinine 0.5 0.5 - 1.1   Potassium 3.5 3.4 - 5.3   Sodium 135 (A) 137 - 147   Chloride 104 99 - 108  Comprehensive metabolic panel     Status: Abnormal   Collection Time: 03/30/20 12:00 AM  Result Value Ref Range   Calcium 8.5 (A) 8.7 - 10.7   Albumin 3.8 3.5 - 5.0  Hepatic function panel     Status: None   Collection Time: 03/30/20 12:00 AM  Result Value Ref Range   Alkaline Phosphatase 95 25 - 125   ALT 20 7 - 35   AST 23 13 - 35   Bilirubin, Total 1.0   CBC and differential     Status: Abnormal   Collection Time: 04/16/20 12:00 AM  Result Value Ref Range   Hemoglobin 10.7 (A) 12.0 - 16.0   HCT 33 (A) 36 - 46   Neutrophils Absolute 2.60    Platelets 92 (A) 150 - 399   WBC 5.0   CBC     Status: Abnormal   Collection Time: 04/16/20 12:00 AM  Result Value Ref Range   RBC 3.5 (A) 3.87 - 0.99  Basic metabolic panel     Status: Abnormal   Collection Time: 04/16/20 12:00 AM  Result Value Ref Range   Glucose 114    BUN 10 4 - 21   CO2 23 (A) 13 - 22   Creatinine 1.2 (A) 0.5 - 1.1   Potassium 4.5 3.4 - 5.3   Sodium 138 137 - 147   Chloride 107 99 - 108  Comprehensive metabolic panel  Status: None   Collection Time: 04/16/20 12:00 AM  Result Value Ref Range   Calcium 9.5 8.7 - 10.7   Albumin 3.5 3.5 - 5.0  Hepatic function panel     Status: None   Collection Time: 04/16/20 12:00 AM  Result Value Ref Range   Alkaline Phosphatase 89 25 - 125   ALT 21 7 - 35   AST 30 13 - 35   Bilirubin, Total 0.7   CBC     Status: None   Collection Time: 04/16/20 12:00 AM  Result Value Ref Range   MCV 95 81 - 99  Corrected Calcium     Status: None   Collection Time:  04/16/20 12:00 AM  Result Value Ref Range   Calcium, Corrected 10 8.4 - 10.2  Protein, total     Status: Abnormal   Collection Time: 04/16/20 12:00 AM  Result Value Ref Range   Total Protein 6.1 (A) 6.3 - 8.2 g/dL    Objective: There were no vitals filed for this visit.  General: Patient is awake, alert, oriented x 3 and in no acute distress.  Dermatology: Skin is warm and dry bilateral with a full thickness ulceration present  Left second toe. Ulceration measures 1 cm x 0.3 cm x 0.4 cm. There is a  Keratotic border with a fatty tissue base. The ulceration does probe to bone. There is no malodor, no active drainage, no erythema, no edema to the toe but there is edema to both lower legs and small excoriations noted to the right anterior shin with areas of dermatitis from patient scratching and localized redness and warmth. No acute signs of infection.   Vascular: Dorsalis Pedis pulse = 1/4 Bilateral,  Posterior Tibial pulse = 1/4 Bilateral,  Capillary Fill Time < 5 seconds  Neurologic: Protective severely diminished bilateral.  Musculosketal: There is no pain to palpation to left second toe ulcer.  There is minimal pain to palpation to swollen legs bilateral.  Bevelyn Buckles' sign negative no acute concerns on the right for acute DVT at this time.    No results for input(s): GRAMSTAIN, LABORGA in the last 8760 hours.  Assessment and Plan:  Problem List Items Addressed This Visit   None   Visit Diagnoses    Toe ulcer, left, with necrosis of bone ([place])    -  Primary   Edema of both lower legs       Abrasion of right leg, initial encounter           -Examined patient and discussed the progression of the wound and treatment alternatives -Continue with PICC line antibiotics long-term for history of osteomyelitis left second toe - Excisionally dedbrided ulceration at left second toe to healthy bleeding borders removing nonviable tissue using a sterile chisel blade. Wound measures post  debridement as above.  Wound was debrided to the level of the dermis with viable wound base exposed to promote healing. Hemostasis was achieved with manuel pressure. Patient tolerated procedure well without any discomfort or anesthesia necessary for this wound debridement.  -Applied Maxorb and dry sterile dressing and instructed patient to continue with daily dressings at home consisting of same with occasional use of Medihoney done by home nurse to the left second toe.  Also applied Surgigrip compression sleeves bilateral and topical antibiotic ointment to excoriations/abrasions on the right lower leg -Educated patient on signs and symptoms of DVT - Advised patient to go to the ER or return to office if the wound worsens or if  constitutional symptoms are present. -Discussed above care with home nurse Tiffany from [place] [facility] -Patient to return to office in 2 weeks for follow up care and evaluation or sooner if problems arise.  Landis Martins, DPM

## 2020-04-20 DIAGNOSIS — M86172 Other acute osteomyelitis, left ankle and foot: Secondary | ICD-10-CM | POA: Diagnosis not present

## 2020-04-22 ENCOUNTER — Inpatient Hospital Stay: Payer: PPO

## 2020-04-22 NOTE — Progress Notes (Signed)
Subjective:  Patient ID: Linda [name], female    DOB: 11-Apr-1970  Age: 50 y.o. MRN: [id number]  Chief Complaint  Patient presents with  . Hospitalization Follow-up    HPI  [name] was admitted 04/10/2020-04/12/2020 for osteomyelitis of second rt toe.  She is currently on taxol for her breast cancer. Amputation was recommended however she refuse.  She is receiving IV rocephin at home throhg her port.  Dr. Cannon Kettle is the podiatrist caring for her. Culture grew Klebsiella, sensitve to rocephin.  Home health nursing is seeing her and she is scheduled to follow up in 2 weeks with Dr. Cannon Kettle.  DM- FBS range 60's-162 Takes synjardy, trulicity, and lantus. Uses novolog when taking prednisone with chemo.  Gabapentin for neuropathy Eats healthy-comsumes lots of vegetables and fruits  Vitamin D deficiency: on vitamin D.   Back pain: 5/10. ON morphine.   Depression/Anxiety- fetzima and ativan. Fairly well controlled.   Breast cancer. Per pt, she is cancer free. Dr. Hinton Rao stopped her taxol last week.   Head contusion: pt leaned over and hit her head, which took her to the hospital initially and then they found the ulcer on her toe.   Current Outpatient Medications on File Prior to Visit  Medication Sig Dispense Refill  . buPROPion (WELLBUTRIN XL) 300 MG 24 hr tablet TAKE ONE TABLET BY MOUTH EVERY EVENING 90 tablet 1  . calcium citrate-vitamin D (CITRACAL+D) 315-200 MG-UNIT tablet Take 1 tablet by mouth 2 (two) times daily.    . clotrimazole-betamethasone (LOTRISONE) cream Apply small amount to affected area twice daily (Patient taking differently: as needed. Apply small amount to affected area twice daily) 45 g 0  . Continuous Blood Gluc Receiver (FREESTYLE LIBRE 2 READER) DEVI E11.69 Check blood sugar 4 times daily as directed 1 each 0  . Continuous Blood Gluc Sensor (FREESTYLE LIBRE 2 SENSOR) MISC E11.69 Change sensor every 14 days as directed 6 each 3  . cyclobenzaprine (FLEXERIL) 10  MG tablet TAKE ONE TABLET BY MOUTH EVERY 8 HOURS AS NEEDED FOR MUSCLE SPASMS 30 tablet 3  . dicyclomine (BENTYL) 20 MG tablet TAKE ONE TABLET BY MOUTH BEFORE MEALS AND AT BEDTIME AS NEEDED FOR STOMACH CRAMPING 180 tablet 1  . Dulaglutide (TRULICITY) 9.38 BO/1.7PZ SOPN INJECT ONE SYRINGE ONCE A WEEK 6 mL 1  . Empagliflozin-metFORMIN HCl (SYNJARDY) 12.05-998 MG TABS Take 1 tablet by mouth 2 (two) times daily. 180 tablet 0  . famotidine (PEPCID) 20 MG tablet TAKE ONE TABLET BY MOUTH TWICE DAILY 180 tablet 1  . ferrous sulfate 325 (65 FE) MG tablet Take 325 mg by mouth every evening.    Marland Kitchen FETZIMA 80 MG CP24 TAKE ONE CAPSULE BY MOUTH ONCE DAILY 90 capsule 1  . gabapentin (NEURONTIN) 300 MG capsule Take 1 capsule (300 mg total) by mouth 3 (three) times daily. 270 capsule 0  . LANTUS SOLOSTAR 100 UNIT/ML Solostar Pen INJECT 68 UNITS SUBCUTANEOUSLY EVERYDAY AT BEDTIME 15 mL 2  . levothyroxine (SYNTHROID) 75 MCG tablet TAKE ONE TABLET BY MOUTH ONCE DAILY 90 tablet 1  . loratadine (CLARITIN) 10 MG tablet Take 10 mg by mouth daily. Taking 3-4 days each week due to bone pain associated with shot from cancer center.    Marland Kitchen LORazepam (ATIVAN) 0.5 MG tablet TAKE ONE TABLET BY MOUTH TWICE DAILY AS NEEDED FOR ANXIETY 30 tablet 2  . losartan (COZAAR) 50 MG tablet TAKE ONE TABLET BY MOUTH EVERY EVENING 90 tablet 1  . Magnesium 500 MG CAPS 500  mg.    . morphine (MS CONTIN) 30 MG 12 hr tablet TAKE ONE TABLET BY MOUTH EVERY TWELVE HOURS 60 tablet 0  . Multiple Vitamin (MULTIVITAMIN WITH MINERALS) TABS tablet Take 1 tablet by mouth daily.     . ondansetron (ZOFRAN) 4 MG tablet Take 1 tablet (4 mg total) by mouth every 4 (four) hours as needed for nausea. 90 tablet 3  . potassium chloride (KLOR-CON) 20 MEQ packet 20 mEq in the morning and at bedtime.    . pravastatin (PRAVACHOL) 20 MG tablet Take 20 mg by mouth at bedtime.    . prochlorperazine (COMPAZINE) 10 MG tablet Take 1 tablet (10 mg total) by mouth every 6 (six)  hours as needed for nausea or vomiting. 90 tablet 3  . senna (SENOKOT) 8.6 MG tablet Take 1 tablet by mouth daily as needed for constipation.    . SODIUM FLUORIDE 5000 SENSITIVE 1.1-5 % GEL Take by mouth 2 (two) times daily.    Marland Kitchen Specialty Vitamins Products (MAGNESIUM, AMINO ACID CHELATE,) 133 MG tablet Take 1 tablet by mouth at bedtime.    . sucralfate (CARAFATE) 1 g tablet Take 1 tablet (1 g total) by mouth 4 (four) times daily -  with meals and at bedtime. 270 tablet 1  . Vitamin D, Ergocalciferol, (DRISDOL) 1.25 MG (50000 UNIT) CAPS capsule TAKE ONE CAPSULE BY MOUTH ONCE WEEKLY ON FRIDAY 12 capsule 1  . zolpidem (AMBIEN) 10 MG tablet TAKE ONE TABLET BY MOUTH EVERYDAY AT BEDTIME AS NEEDED 30 tablet 5  . Blood Glucose Monitoring Suppl (ONETOUCH VERIO REFLECT) w/Device KIT AS DIRECTED    . cefTRIAXone (ROCEPHIN) 10 g injection     . Insulin Pen Needle (BD PEN NEEDLE NANO U/F) 32G X 4 MM MISC Inject 1 each into the skin in the morning and at bedtime. (Patient not taking: Reported on 04/23/2020) 200 each 3  . OneTouch Delica Lancets 10R MISC 1 each by Does not apply route daily before breakfast. Check blood sugar twice daily. 100 each 3  . pantoprazole (PROTONIX) 40 MG tablet TAKE ONE TABLET BY MOUTH TWICE DAILY 180 tablet 1  . Pegfilgrastim-jmdb (FULPHILA [place]) Inject into the skin.     No current facility-administered medications on file prior to visit.   Past Medical History:  Diagnosis Date  . Anemia   . Anemia, unspecified   . Anemia, unspecified   . Anemia, unspecified   . Anxiety   . Arthritis   . BRCA gene mutation positive   . Chronic pain syndrome   . Depression   . Diabetes mellitus without complication ([place])    type 2  . Gastritis   . 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 GERD (gastroesophageal reflux disease)   . History of kidney stones   . Hypertension   . Hypothyroidism   . Malignant  neoplasm of central portion of right female breast ([place])   . Malignant neoplasm of lower-inner quadrant of right female breast ([place])   . Malignant neoplasm of lower-inner quadrant of right female breast ([place])   . Mixed hyperlipidemia   . Other primary thrombocytopenia ([place])   . Sleep apnea    hx of . No longer has   Past Surgical History:  Procedure Laterality Date  . APPENDECTOMY    . BACK SURGERY     x 3 lower disc  . BILATERAL TOTAL MASTECTOMY WITH AXILLARY LYMPH NODE DISSECTION Bilateral 09/2019  .  CHOLECYSTECTOMY    . nephrolithiasis    . ROBOTIC ASSISTED TOTAL HYSTERECTOMY WITH BILATERAL SALPINGO OOPHERECTOMY Bilateral 06/14/2016   Procedure: ROBOTIC ASSISTED TOTAL HYSTERECTOMY WITH BILATERAL SALPINGO OOPHORECTOMY;  Surgeon: Nancy Marus, MD;  Location: [facility];  Service: Gynecology;  Laterality: Bilateral;    Family History  Problem Relation Age of Onset  . Cancer Mother        breast  . CAD Father   . Diabetes Father   . Heart failure Father   . Cancer Father        renal carcinoma  . Kidney failure Father   . Stroke Paternal Grandmother    Social History   Socioeconomic History  . Marital status: Divorced    Spouse name: Not on file  . Number of children: Not on file  . Years of education: Not on file  . Highest education level: Not on file  Occupational History  . Occupation: disabled  Tobacco Use  . Smoking status: Never Smoker  . Smokeless tobacco: Never Used  Substance and Sexual Activity  . Alcohol use: No  . Drug use: No  . Sexual activity: Yes  Other Topics Concern  . Not on file  Social History Narrative   wears sunscreen, brushes and flosses daily, see's dentist bi-annually, has smoke/carbon monoxide detectors, wears a seatbelt and practices gun safety   Social Determinants of Health   Financial Resource Strain: Not on file  Food Insecurity: No Food Insecurity  . Worried About Charity fundraiser in the Last Year: Never true  . Ran Out of  Food in the Last Year: Never true  Transportation Needs: Not on file  Physical Activity: Not on file  Stress: Not on file  Social Connections: Not on file    Review of Systems  Constitutional: Negative for chills, fatigue and fever.  HENT: Negative for congestion, ear pain, rhinorrhea and sore throat.   Respiratory: Negative for cough and shortness of breath.   Cardiovascular: Negative for chest pain.  Gastrointestinal: Positive for diarrhea (Dr. Hinton Rao started her on a probiotic which had helped. ). Negative for abdominal pain, constipation, nausea and vomiting.  Genitourinary: Negative for dysuria and urgency.  Musculoskeletal: Positive for back pain and myalgias.  Neurological: Negative for dizziness, weakness, light-headedness and headaches.       Legs very restless. Cramping in lower back.   Psychiatric/Behavioral: Negative for dysphoric mood. The patient is not nervous/anxious.      Objective:  BP 138/60   Pulse 80 Comment: irregular  Temp (!) 97.3 F (36.3 C)   Resp 16   Ht 5' 2"  (1.575 m)   Wt 184 lb (83.5 kg)   LMP 06/13/2009   BMI 33.65 kg/m   BP/Weight 04/23/2020 4/81/8563 01/27/9700  Systolic BP 637 858 850  Diastolic BP 60 80 76  Wt. (Lbs) 184 212.7 197.08  BMI 33.65 38.9 36.05    Physical Exam Vitals reviewed.  Constitutional:      Appearance: Normal appearance. She is normal weight.  Cardiovascular:     Rate and Rhythm: Normal rate and regular rhythm.     Heart sounds: Normal heart sounds.  Pulmonary:     Effort: Pulmonary effort is normal. No respiratory distress.     Breath sounds: Normal breath sounds.  Abdominal:     General: Abdomen is flat. Bowel sounds are normal.     Palpations: Abdomen is soft.     Tenderness: There is no abdominal tenderness (mild upper quadrants. ).  Skin:  Findings: Bruising (rt forehead. Alopecia related to chemo. ) present.  Neurological:     Mental Status: She is alert and oriented to person, place, and time.   Psychiatric:        Mood and Affect: Mood normal.        Behavior: Behavior normal.   No foot exam done as her foot is wrapped and she is scheduled to return to Dr. Cannon Kettle.    Lab Results  Component Value Date   WBC 5.0 04/16/2020   HGB 10.7 (A) 04/16/2020   HCT 33 (A) 04/16/2020   PLT 92 (A) 04/16/2020   GLUCOSE 134 (H) 11/27/2019   CHOL 181 10/09/2019   TRIG 146 10/09/2019   HDL 43 10/09/2019   LDLCALC 112 (H) 10/09/2019   ALT 21 04/16/2020   AST 30 04/16/2020   NA 138 04/16/2020   K 4.5 04/16/2020   CL 107 04/16/2020   CREATININE 1.2 (A) 04/16/2020   BUN 10 04/16/2020   CO2 23 (A) 04/16/2020   TSH 1.450 07/08/2019   HGBA1C 6.9 (H) 10/09/2019   MICROALBUR 30 10/09/2019      Assessment & Plan:   1. Hypomagnesemia - Magnesium - Phosphorus  2. Elevated CK - CK  3. Mixed hyperlipidemia Not fasting.  Recommend continue to work on eating healthy diet and exercise.   4. Hypertensive kidney disease with stage 3b chronic kidney disease ([place]) Stable Continue to work on eating a healthy diet and exercise.  Labs drawn today.  - CBC with Differential/Platelet - Comprehensive metabolic panel  5. Vitamin D insufficiency - VITAMIN D 25 Hydroxy (Vit-D Deficiency, Fractures)  6. Diabetic nephropathy associated with type 2 diabetes mellitus ([place]) Control: good Recommend check sugars qac and qhs Recommend check feet daily. Recommend annual eye exams. Medicines: no changes Continue to work on eating a healthy diet and exercise.  Labs drawn today.   - Hemoglobin A1c  7. Acquired hypothyroidism - TSH  8. Other iron deficiency anemia Check cbc  9. RLS (restless legs syndrome) - rOPINIRole (REQUIP) 0.25 MG tablet; Take 1 tablet (0.25 mg total) by mouth at bedtime.  Dispense: 30 tablet; Refill: 2  10. Osteomyelitis of second toe of right foot ([place]) Continue iv rocephin.   11. Thrombocytopenia ([place])  check cbc  Meds ordered this encounter  Medications  .  rOPINIRole (REQUIP) 0.25 MG tablet    Sig: Take 1 tablet (0.25 mg total) by mouth at bedtime.    Dispense:  30 tablet    Refill:  2    Orders Placed This Encounter  Procedures  . CBC with Differential/Platelet  . Comprehensive metabolic panel  . Hemoglobin A1c  . TSH  . VITAMIN D 25 Hydroxy (Vit-D Deficiency, Fractures)  . Magnesium  . CK  . Phosphorus      45 minutes dedicated to the care of this patient on the date of this encounter to include face-to-face time with the patient, as well as: review of labs/hospital stay, etc.   Follow-up: Return in about 3 months (around 07/23/2020) for fasting.  An After Visit Summary was printed and given to the patient.  [name], MD [facility] [phone]

## 2020-04-23 ENCOUNTER — Other Ambulatory Visit: Payer: Self-pay

## 2020-04-23 ENCOUNTER — Encounter: Payer: Self-pay | Admitting: Family Medicine

## 2020-04-23 ENCOUNTER — Ambulatory Visit (INDEPENDENT_AMBULATORY_CARE_PROVIDER_SITE_OTHER): Payer: PPO | Admitting: Family Medicine

## 2020-04-23 DIAGNOSIS — E039 Hypothyroidism, unspecified: Secondary | ICD-10-CM

## 2020-04-23 DIAGNOSIS — D696 Thrombocytopenia, unspecified: Secondary | ICD-10-CM | POA: Diagnosis not present

## 2020-04-23 DIAGNOSIS — D508 Other iron deficiency anemias: Secondary | ICD-10-CM

## 2020-04-23 DIAGNOSIS — E559 Vitamin D deficiency, unspecified: Secondary | ICD-10-CM | POA: Diagnosis not present

## 2020-04-23 DIAGNOSIS — E782 Mixed hyperlipidemia: Secondary | ICD-10-CM | POA: Diagnosis not present

## 2020-04-23 DIAGNOSIS — M869 Osteomyelitis, unspecified: Secondary | ICD-10-CM

## 2020-04-23 DIAGNOSIS — E1121 Type 2 diabetes mellitus with diabetic nephropathy: Secondary | ICD-10-CM | POA: Diagnosis not present

## 2020-04-23 DIAGNOSIS — N1832 Chronic kidney disease, stage 3b: Secondary | ICD-10-CM | POA: Diagnosis not present

## 2020-04-23 DIAGNOSIS — R748 Abnormal levels of other serum enzymes: Secondary | ICD-10-CM

## 2020-04-23 DIAGNOSIS — M86172 Other acute osteomyelitis, left ankle and foot: Secondary | ICD-10-CM | POA: Diagnosis not present

## 2020-04-23 DIAGNOSIS — G2581 Restless legs syndrome: Secondary | ICD-10-CM

## 2020-04-23 DIAGNOSIS — A419 Sepsis, unspecified organism: Secondary | ICD-10-CM | POA: Diagnosis not present

## 2020-04-23 DIAGNOSIS — I129 Hypertensive chronic kidney disease with stage 1 through stage 4 chronic kidney disease, or unspecified chronic kidney disease: Secondary | ICD-10-CM

## 2020-04-23 MED ORDER — ROPINIROLE HCL 0.25 MG PO TABS: 0.2500 mg | ORAL_TABLET | Freq: Every day | ORAL | 2 refills | Status: DC

## 2020-04-23 NOTE — Patient Instructions (Signed)
Start requip 0.5 mg once at bedtime.

## 2020-04-24 LAB — CK: Total CK: 43 U/L (ref 32–182)

## 2020-04-24 LAB — COMPREHENSIVE METABOLIC PANEL
ALT: 14 IU/L (ref 0–32)
AST: 24 IU/L (ref 0–40)
Albumin/Globulin Ratio: 1.6 (ref 1.2–2.2)
Albumin: 4.2 g/dL (ref 3.8–4.9)
Alkaline Phosphatase: 94 IU/L (ref 44–121)
BUN/Creatinine Ratio: 9 (ref 9–23)
BUN: 11 mg/dL (ref 6–24)
Bilirubin Total: 0.5 mg/dL (ref 0.0–1.2)
CO2: 22 mmol/L (ref 20–29)
Calcium: 9.2 mg/dL (ref 8.7–10.2)
Chloride: 102 mmol/L (ref 96–106)
Creatinine, Ser: 1.25 mg/dL — ABNORMAL HIGH (ref 0.57–1.00)
Globulin, Total: 2.6 g/dL (ref 1.5–4.5)
Glucose: 141 mg/dL — ABNORMAL HIGH (ref 65–99)
Potassium: 4.6 mmol/L (ref 3.5–5.2)
Sodium: 142 mmol/L (ref 134–144)
Total Protein: 6.8 g/dL (ref 6.0–8.5)
eGFR: 52 mL/min/{1.73_m2} — ABNORMAL LOW (ref >59)

## 2020-04-24 LAB — HEMOGLOBIN A1C
Est. average glucose Bld gHb Est-mCnc: 166 mg/dL
Hgb A1c MFr Bld: 7.4 % — ABNORMAL HIGH (ref 4.8–5.6)

## 2020-04-24 LAB — CBC WITH DIFFERENTIAL/PLATELET
Basophils Absolute: 0.1 10*3/uL (ref 0.0–0.2)
Basos: 1 %
EOS (ABSOLUTE): 0.1 10*3/uL (ref 0.0–0.4)
Eos: 1 %
Hematocrit: 35.5 % (ref 34.0–46.6)
Hemoglobin: 11.3 g/dL (ref 11.1–15.9)
Immature Grans (Abs): 0 10*3/uL (ref 0.0–0.1)
Immature Granulocytes: 0 %
Lymphocytes Absolute: 1.5 10*3/uL (ref 0.7–3.1)
Lymphs: 27 %
MCH: 30.6 pg (ref 26.6–33.0)
MCHC: 31.8 g/dL (ref 31.5–35.7)
MCV: 96 fL (ref 79–97)
Monocytes Absolute: 0.5 10*3/uL (ref 0.1–0.9)
Monocytes: 8 %
Neutrophils Absolute: 3.4 10*3/uL (ref 1.4–7.0)
Neutrophils: 63 %
Platelets: 128 10*3/uL — ABNORMAL LOW (ref 150–450)
RBC: 3.69 x10E6/uL — ABNORMAL LOW (ref 3.77–5.28)
RDW: 15.6 % — ABNORMAL HIGH (ref 11.7–15.4)
WBC: 5.5 10*3/uL (ref 3.4–10.8)

## 2020-04-24 LAB — PHOSPHORUS: Phosphorus: 3.2 mg/dL (ref 3.0–4.3)

## 2020-04-24 LAB — MAGNESIUM: Magnesium: 2.1 mg/dL (ref 1.6–2.3)

## 2020-04-24 LAB — VITAMIN D 25 HYDROXY (VIT D DEFICIENCY, FRACTURES): Vit D, 25-Hydroxy: 53.8 ng/mL (ref 30.0–100.0)

## 2020-04-24 LAB — TSH: TSH: 3.92 u[IU]/mL (ref 0.450–4.500)

## 2020-04-27 ENCOUNTER — Other Ambulatory Visit: Payer: Self-pay

## 2020-04-27 ENCOUNTER — Telehealth: Payer: Self-pay

## 2020-04-27 DIAGNOSIS — E1169 Type 2 diabetes mellitus with other specified complication: Secondary | ICD-10-CM

## 2020-04-27 DIAGNOSIS — L97529 Non-pressure chronic ulcer of other part of left foot with unspecified severity: Secondary | ICD-10-CM | POA: Diagnosis not present

## 2020-04-27 DIAGNOSIS — Z1509 Genetic susceptibility to other malignant neoplasm: Secondary | ICD-10-CM

## 2020-04-27 DIAGNOSIS — Z1501 Genetic susceptibility to malignant neoplasm of breast: Secondary | ICD-10-CM

## 2020-04-27 DIAGNOSIS — M86172 Other acute osteomyelitis, left ankle and foot: Secondary | ICD-10-CM | POA: Diagnosis not present

## 2020-04-27 MED ORDER — FAMOTIDINE 20 MG PO TABS: 20.0000 mg | ORAL_TABLET | Freq: Two times a day (BID) | ORAL | 1 refills | Status: DC

## 2020-04-27 MED ORDER — BUPROPION HCL ER (XL) 300 MG PO TB24: 1.0000 | ORAL_TABLET | Freq: Every evening | ORAL | 1 refills | Status: DC

## 2020-04-27 MED ORDER — MORPHINE SULFATE ER 30 MG PO TBCR: 30.0000 mg | EXTENDED_RELEASE_TABLET | Freq: Two times a day (BID) | ORAL | 0 refills | Status: DC

## 2020-04-27 MED ORDER — TRULICITY 0.75 MG/0.5ML ~~LOC~~ SOAJ: SUBCUTANEOUS | 1 refills | Status: DC

## 2020-04-27 MED ORDER — LEVOTHYROXINE SODIUM 75 MCG PO TABS: 75.0000 ug | ORAL_TABLET | Freq: Every day | ORAL | 1 refills | Status: DC

## 2020-04-27 MED ORDER — LANTUS SOLOSTAR 100 UNIT/ML ~~LOC~~ SOPN: PEN_INJECTOR | SUBCUTANEOUS | 2 refills | Status: DC

## 2020-04-27 MED ORDER — LOSARTAN POTASSIUM 50 MG PO TABS: 1.0000 | ORAL_TABLET | Freq: Every evening | ORAL | 1 refills | Status: DC

## 2020-04-27 MED ORDER — PANTOPRAZOLE SODIUM 40 MG PO TBEC: 1.0000 | DELAYED_RELEASE_TABLET | Freq: Two times a day (BID) | ORAL | 1 refills | Status: DC

## 2020-04-27 NOTE — Progress Notes (Signed)
Chronic Care Management Pharmacy Assistant   Name: Maili Shutters  MRN: [id number] DOB: [birth year]   Reason for Encounter: Medication coordination for [facility] delivery    Medications: Outpatient Encounter Medications as of 04/27/2020  Medication Sig Note  . Blood Glucose Monitoring Suppl (ONETOUCH VERIO REFLECT) w/Device KIT AS DIRECTED   . buPROPion (WELLBUTRIN XL) 300 MG 24 hr tablet TAKE ONE TABLET BY MOUTH EVERY EVENING   . calcium citrate-vitamin D (CITRACAL+D) 315-200 MG-UNIT tablet Take 1 tablet by mouth 2 (two) times daily.   . cefTRIAXone (ROCEPHIN) 10 g injection    . clotrimazole-betamethasone (LOTRISONE) cream Apply small amount to affected area twice daily (Patient taking differently: as needed. Apply small amount to affected area twice daily)   . Continuous Blood Gluc Receiver (FREESTYLE LIBRE 2 READER) DEVI E11.69 Check blood sugar 4 times daily as directed   . Continuous Blood Gluc Sensor (FREESTYLE LIBRE 2 SENSOR) MISC E11.69 Change sensor every 14 days as directed   . cyclobenzaprine (FLEXERIL) 10 MG tablet TAKE ONE TABLET BY MOUTH EVERY 8 HOURS AS NEEDED FOR MUSCLE SPASMS   . dicyclomine (BENTYL) 20 MG tablet TAKE ONE TABLET BY MOUTH BEFORE MEALS AND AT BEDTIME AS NEEDED FOR STOMACH CRAMPING   . Dulaglutide (TRULICITY) 4.62 VO/3.5KK SOPN INJECT ONE SYRINGE ONCE A WEEK   . Empagliflozin-metFORMIN HCl (SYNJARDY) 12.05-998 MG TABS Take 1 tablet by mouth 2 (two) times daily.   . famotidine (PEPCID) 20 MG tablet TAKE ONE TABLET BY MOUTH TWICE DAILY   . ferrous sulfate 325 (65 FE) MG tablet Take 325 mg by mouth every evening.   Marland Kitchen FETZIMA 80 MG CP24 TAKE ONE CAPSULE BY MOUTH ONCE DAILY   . gabapentin (NEURONTIN) 300 MG capsule Take 1 capsule (300 mg total) by mouth 3 (three) times daily.   . Insulin Pen Needle (BD PEN NEEDLE NANO U/F) 32G X 4 MM MISC Inject 1 each into the skin in the morning and at bedtime. (Patient not taking: Reported on 04/23/2020)   . LANTUS  SOLOSTAR 100 UNIT/ML Solostar Pen INJECT 68 UNITS SUBCUTANEOUSLY EVERYDAY AT BEDTIME   . levothyroxine (SYNTHROID) 75 MCG tablet TAKE ONE TABLET BY MOUTH ONCE DAILY   . loratadine (CLARITIN) 10 MG tablet Take 10 mg by mouth daily. Taking 3-4 days each week due to bone pain associated with shot from cancer center.   Marland Kitchen LORazepam (ATIVAN) 0.5 MG tablet TAKE ONE TABLET BY MOUTH TWICE DAILY AS NEEDED FOR ANXIETY   . losartan (COZAAR) 50 MG tablet TAKE ONE TABLET BY MOUTH EVERY EVENING   . Magnesium 500 MG CAPS 500 mg.   . morphine (MS CONTIN) 30 MG 12 hr tablet TAKE ONE TABLET BY MOUTH EVERY TWELVE HOURS   . Multiple Vitamin (MULTIVITAMIN WITH MINERALS) TABS tablet Take 1 tablet by mouth daily.  06/08/2018: Pt plans on purchasing again when she can get out.  . ondansetron (ZOFRAN) 4 MG tablet Take 1 tablet (4 mg total) by mouth every 4 (four) hours as needed for nausea.   Glory Rosebush Delica Lancets 93G MISC 1 each by Does not apply route daily before breakfast. Check blood sugar twice daily.   . pantoprazole (PROTONIX) 40 MG tablet TAKE ONE TABLET BY MOUTH TWICE DAILY   . Pegfilgrastim-jmdb (FULPHILA [place]) Inject into the skin.   . potassium chloride (KLOR-CON) 20 MEQ packet 20 mEq in the morning and at bedtime.   . pravastatin (PRAVACHOL) 20 MG tablet Take 20 mg by mouth at bedtime.   Marland Kitchen  prochlorperazine (COMPAZINE) 10 MG tablet Take 1 tablet (10 mg total) by mouth every 6 (six) hours as needed for nausea or vomiting.   Marland Kitchen rOPINIRole (REQUIP) 0.25 MG tablet Take 1 tablet (0.25 mg total) by mouth at bedtime.   . senna (SENOKOT) 8.6 MG tablet Take 1 tablet by mouth daily as needed for constipation.   . SODIUM FLUORIDE 5000 SENSITIVE 1.1-5 % GEL Take by mouth 2 (two) times daily.   Marland Kitchen Specialty Vitamins Products (MAGNESIUM, AMINO ACID CHELATE,) 133 MG tablet Take 1 tablet by mouth at bedtime.   . sucralfate (CARAFATE) 1 g tablet Take 1 tablet (1 g total) by mouth 4 (four) times daily -  with meals and at  bedtime.   . Vitamin D, Ergocalciferol, (DRISDOL) 1.25 MG (50000 UNIT) CAPS capsule TAKE ONE CAPSULE BY MOUTH ONCE WEEKLY ON FRIDAY   . zolpidem (AMBIEN) 10 MG tablet TAKE ONE TABLET BY MOUTH EVERYDAY AT BEDTIME AS NEEDED    No facility-administered encounter medications on file as of 04/27/2020.    Reviewed chart for medication changes ahead of medication coordination call.  No OVs, Consults, or hospital visits since last care coordination call/Pharmacist visit. (If appropriate, list visit date, provider name)  No medication changes indicated OR if recent visit, treatment plan here.  BP Readings from Last 3 Encounters:  04/23/20 138/60  04/16/20 130/80  04/01/20 (!) 145/76    Lab Results  Component Value Date   HGBA1C 7.4 (H) 04/23/2020     Patient obtains medications through Vials  30 Days   Last adherence delivery included:  Losartan Potassium 50 mg-  Fetzima-80 mg-  Bupropion Hcl Xl 300 mg- Potassium chloride ER 10 meq Morphine Sulfate 30 mg  Zolpidem 10 mg Famotidine 20 mg Pravastatin 20 mg-  Lantus Solostar U 100- inject 8.82CM Trulicity-inject 1 syringe once a week Pantoprazole 40 mg Sucralfate 1 gram Cyclobenzaprine 10 mg  Potassium chloride ER 10 meq Vitamin D 50,000 units  Levothyroxine 75 mcg Ondansetron 69m-   Patient declined (meds) last month due to PRN use/additional supply on hand. Dexcom Needles  Patient is due for next adherence delivery on: 05/07/20 Called patient and reviewed medications and coordinated delivery.  This delivery to include: Losartan Potassium 50 mg-  Fetzima-80 mg-  Bupropion Hcl Xl 300 mg- Potassium chloride ER 10 meq Morphine Sulfate 30 mg  Zolpidem 10 mg Famotidine 20 mg Pravastatin 20 mg-  Lantus Solostar U 100- inject 00.34JZTrulicity-inject 1 syringe once a week Pantoprazole 40 mg Vitamin D 50,000 units  Levothyroxine 75 mcg   Patient declined the following medications (meds) due to  (reason) Cyclobenzaprine-  Has on hand Sucralfate has on hand   Patient needs refills for  Bupropion Hcl Xl 300 mg- Famotidine 20 mg Trulicity- Pantoprazole 40 mg Lantus Solostar U 100 Levothyroxine 75 mcg Losartan Potassium 50 mg- Morphine  Confirmed delivery date of 4/14, advised patient that pharmacy will contact them the morning of delivery.   TClarita Leber CGroverPharmacist Assistant [phone]

## 2020-04-30 DIAGNOSIS — M86172 Other acute osteomyelitis, left ankle and foot: Secondary | ICD-10-CM | POA: Diagnosis not present

## 2020-04-30 DIAGNOSIS — H353131 Nonexudative age-related macular degeneration, bilateral, early dry stage: Secondary | ICD-10-CM | POA: Diagnosis not present

## 2020-04-30 DIAGNOSIS — A419 Sepsis, unspecified organism: Secondary | ICD-10-CM | POA: Diagnosis not present

## 2020-04-30 LAB — HM DIABETES EYE EXAM

## 2020-05-01 ENCOUNTER — Other Ambulatory Visit: Payer: Self-pay

## 2020-05-01 ENCOUNTER — Encounter: Payer: Self-pay | Admitting: Sports Medicine

## 2020-05-01 ENCOUNTER — Ambulatory Visit: Payer: PPO | Admitting: Sports Medicine

## 2020-05-01 DIAGNOSIS — E11621 Type 2 diabetes mellitus with foot ulcer: Secondary | ICD-10-CM | POA: Diagnosis not present

## 2020-05-01 DIAGNOSIS — R600 Localized edema: Secondary | ICD-10-CM

## 2020-05-01 DIAGNOSIS — L97522 Non-pressure chronic ulcer of other part of left foot with fat layer exposed: Secondary | ICD-10-CM | POA: Diagnosis not present

## 2020-05-01 DIAGNOSIS — D6959 Other secondary thrombocytopenia: Secondary | ICD-10-CM

## 2020-05-01 DIAGNOSIS — T451X5A Adverse effect of antineoplastic and immunosuppressive drugs, initial encounter: Secondary | ICD-10-CM

## 2020-05-01 NOTE — Progress Notes (Signed)
Subjective: Linda [name] is a 50 y.o. female patient seen in office for follow-up evaluation of ulceration of the left 2nd toe after discharge from [facility] on antibiotics for osteomyelitis. Patient has a history of diabetes and a blood glucose level today of 107 mg/dl, Denies nausea/fever/vomiting/chills/night sweats/shortness of breath.  Reports home nurse states that the wound is doing very well and getting smaller.  Patient is on picc line antibiotics at home and has home nursing changing the dressings twice a week.   Patient Active Problem List   Diagnosis Date Noted  . Acute postoperative respiratory insufficiency 04/17/2020  . AKI (acute kidney injury) ([place]) 04/17/2020  . 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 Osteomyelitis ([place]) 04/17/2020  . 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 Dehydration 03/16/2020  . Hypokalemia 03/09/2020  . Chemotherapy-induced thrombocytopenia 02/06/2020  . Thrombocytopenia, unspecified ([place]) 01/27/2020  . Malignant neoplasm of lower-inner quadrant of right breast of female, estrogen receptor negative ([place]) 10/01/2019  . Chronic pain syndrome 07/08/2019  . Memory loss 07/08/2019  . Depression, major, recurrent, mild ([place]) 07/08/2019  . Uncomplicated opioid dependence ([place]) 07/08/2019  . Mixed hyperlipidemia 04/11/2019  . Dyslipidemia associated with type 2 diabetes mellitus ([place]) 04/11/2019  . Essential hypertension, benign 04/11/2019  . Major depressive disorder, single episode, mild ([place]) 04/11/2019  . Acquired hypothyroidism 04/11/2019  . Vitamin D insufficiency 04/11/2019  . Class 2 severe obesity due to excess calories with serious comorbidity and body mass index (BMI) of 35.0 to 35.9 in adult ([place]) 04/11/2019  . Pre-ulcerative calluses 03/04/2019  . Liver cirrhosis secondary to NASH (nonalcoholic steatohepatitis) ([place]) 09/13/2018     Class: Chronic  . BRCA1 positive 06/14/2016   Current Outpatient Medications on File Prior to Visit  Medication Sig Dispense Refill  . Blood Glucose Monitoring Suppl (ONETOUCH VERIO REFLECT) w/Device KIT AS DIRECTED    . buPROPion (WELLBUTRIN XL) 300 MG 24 hr tablet Take 1 tablet (300 mg total) by mouth every evening. 90 tablet 1  . calcium citrate-vitamin D (CITRACAL+D) 315-200 MG-UNIT tablet Take 1 tablet by mouth 2 (two) times daily.    . clotrimazole-betamethasone (LOTRISONE) cream Apply small amount to affected area twice daily (Patient taking differently: as needed. Apply small amount to affected area twice daily) 45 g 0  . Continuous Blood Gluc Receiver (FREESTYLE LIBRE 2 READER) DEVI E11.69 Check blood sugar 4 times daily as directed 1 each 0  . Continuous Blood Gluc Sensor (FREESTYLE LIBRE 2 SENSOR) MISC E11.69 Change sensor every 14 days as directed 6 each 3  . cyclobenzaprine (FLEXERIL) 10 MG tablet TAKE ONE TABLET BY MOUTH EVERY 8 HOURS AS NEEDED FOR MUSCLE SPASMS 30 tablet 3  . dicyclomine (BENTYL) 20 MG tablet TAKE ONE TABLET BY MOUTH BEFORE MEALS AND AT BEDTIME AS NEEDED FOR STOMACH CRAMPING 180 tablet 1  . Dulaglutide (TRULICITY) 1.74 BS/4.9QP SOPN INJECT ONE SYRINGE ONCE A WEEK 6 mL 1  . Empagliflozin-metFORMIN HCl (SYNJARDY) 12.05-998 MG TABS Take 1 tablet by mouth 2 (two) times daily. 180 tablet 0  . famotidine (PEPCID) 20 MG tablet Take 1 tablet (20 mg total) by mouth 2 (two) times daily. 180 tablet 1  . ferrous sulfate 325 (65 FE) MG tablet Take 325 mg by mouth every evening.    Marland Kitchen FETZIMA 80 MG CP24 TAKE ONE CAPSULE BY MOUTH ONCE DAILY 90 capsule 1  . gabapentin (NEURONTIN) 300 MG capsule Take 1  capsule (300 mg total) by mouth 3 (three) times daily. 270 capsule 0  . insulin glargine (LANTUS SOLOSTAR) 100 UNIT/ML Solostar Pen INJECT 68 UNITS SUBCUTANEOUSLY EVERYDAY AT BEDTIME 15 mL 2  . Insulin Pen Needle (BD PEN NEEDLE NANO U/F) 32G X 4 MM MISC Inject 1 each into the skin in  the morning and at bedtime. (Patient not taking: Reported on 04/23/2020) 200 each 3  . levothyroxine (SYNTHROID) 75 MCG tablet Take 1 tablet (75 mcg total) by mouth daily. 90 tablet 1  . loratadine (CLARITIN) 10 MG tablet Take 10 mg by mouth daily. Taking 3-4 days each week due to bone pain associated with shot from cancer center.    Marland Kitchen LORazepam (ATIVAN) 0.5 MG tablet TAKE ONE TABLET BY MOUTH TWICE DAILY AS NEEDED FOR ANXIETY 30 tablet 2  . losartan (COZAAR) 50 MG tablet Take 1 tablet (50 mg total) by mouth every evening. 90 tablet 1  . Magnesium 500 MG CAPS 500 mg.    . morphine (MS CONTIN) 30 MG 12 hr tablet Take 1 tablet (30 mg total) by mouth every 12 (twelve) hours. 60 tablet 0  . Multiple Vitamin (MULTIVITAMIN WITH MINERALS) TABS tablet Take 1 tablet by mouth daily.     . ondansetron (ZOFRAN) 4 MG tablet Take 1 tablet (4 mg total) by mouth every 4 (four) hours as needed for nausea. 90 tablet 3  . OneTouch Delica Lancets 70V MISC 1 each by Does not apply route daily before breakfast. Check blood sugar twice daily. 100 each 3  . pantoprazole (PROTONIX) 40 MG tablet Take 1 tablet (40 mg total) by mouth 2 (two) times daily. 180 tablet 1  . Pegfilgrastim-jmdb (FULPHILA [place]) Inject into the skin.    . potassium chloride (KLOR-CON) 20 MEQ packet 20 mEq in the morning and at bedtime.    . pravastatin (PRAVACHOL) 20 MG tablet Take 20 mg by mouth at bedtime.    . prochlorperazine (COMPAZINE) 10 MG tablet Take 1 tablet (10 mg total) by mouth every 6 (six) hours as needed for nausea or vomiting. 90 tablet 3  . rOPINIRole (REQUIP) 0.25 MG tablet Take 1 tablet (0.25 mg total) by mouth at bedtime. 30 tablet 2  . senna (SENOKOT) 8.6 MG tablet Take 1 tablet by mouth daily as needed for constipation.    . SODIUM FLUORIDE 5000 SENSITIVE 1.1-5 % GEL Take by mouth 2 (two) times daily.    Marland Kitchen Specialty Vitamins Products (MAGNESIUM, AMINO ACID CHELATE,) 133 MG tablet Take 1 tablet by mouth at bedtime.    . sucralfate  (CARAFATE) 1 g tablet Take 1 tablet (1 g total) by mouth 4 (four) times daily -  with meals and at bedtime. 270 tablet 1  . Vitamin D, Ergocalciferol, (DRISDOL) 1.25 MG (50000 UNIT) CAPS capsule TAKE ONE CAPSULE BY MOUTH ONCE WEEKLY ON FRIDAY 12 capsule 1  . zolpidem (AMBIEN) 10 MG tablet TAKE ONE TABLET BY MOUTH EVERYDAY AT BEDTIME AS NEEDED 30 tablet 5   No current facility-administered medications on file prior to visit.   Allergies  Allergen Reactions  . Codeine Shortness Of Breath    Other reaction(s): SHOB  . Celecoxib Other (See Comments)    Unknown reaction Other reaction(s): Unknown  . Ezetimibe     Other reaction(s): Unknown  . Ezetimibe-Simvastatin Other (See Comments)    Unknown reaction  . Propranolol     Other reaction(s): Unknown  . Propranolol Hcl Other (See Comments)    Unknown reaction  . Simvastatin  Other reaction(s): Unknown      Objective: There were no vitals filed for this visit.  General: Patient is awake, alert, oriented x 3 and in no acute distress.  Dermatology: Skin is warm and dry bilateral with a full thickness ulceration present  Left second toe. Ulceration measures 0.7 cm x 0.2 cm x 0.1 cm. There is a  Keratotic border with a granular base. The ulceration does not probe to bone. There is no malodor, no active drainage, decreased erythema, no edema to the toe but the lower extremity edema is doing better with use of compression. No acute signs of infection.   Vascular: Dorsalis Pedis pulse = 1/4 Bilateral,  Posterior Tibial pulse = 1/4 Bilateral,  Capillary Fill Time < 5 seconds  Neurologic: Protective severely diminished bilateral.  Musculosketal: There is no pain to palpation to left second toe ulcer.  There is minimal pain to palpation to swollen legs bilateral.  Bevelyn Buckles' sign negative no acute concerns on the right for acute DVT at this time.    No results for input(s): GRAMSTAIN, LABORGA in the last 8760 hours.  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 Wound measures post debridement as above.  Wound was debrided to the level of the dermis with viable wound base exposed to promote healing. Hemostasis was achieved with manuel pressure. Patient tolerated procedure well without any discomfort or anesthesia necessary for this wound debridement.  -Applied Medihoney and Maxorb and dry sterile dressing and instructed patient to continue with daily dressings at home consisting of same -Continue with compression garments to assist with edema control -Patient to return to office in 2-3 weeks for follow up care and evaluation or sooner if problems arise.  Landis Martins, DPM

## 2020-05-04 DIAGNOSIS — A4159 Other Gram-negative sepsis: Secondary | ICD-10-CM | POA: Diagnosis not present

## 2020-05-04 DIAGNOSIS — M86172 Other acute osteomyelitis, left ankle and foot: Secondary | ICD-10-CM | POA: Diagnosis not present

## 2020-05-08 DIAGNOSIS — M86172 Other acute osteomyelitis, left ankle and foot: Secondary | ICD-10-CM | POA: Diagnosis not present

## 2020-05-08 DIAGNOSIS — A419 Sepsis, unspecified organism: Secondary | ICD-10-CM | POA: Diagnosis not present

## 2020-05-11 DIAGNOSIS — M86172 Other acute osteomyelitis, left ankle and foot: Secondary | ICD-10-CM | POA: Diagnosis not present

## 2020-05-11 DIAGNOSIS — A4159 Other Gram-negative sepsis: Secondary | ICD-10-CM | POA: Diagnosis not present

## 2020-05-13 DIAGNOSIS — N200 Calculus of kidney: Secondary | ICD-10-CM | POA: Diagnosis not present

## 2020-05-13 DIAGNOSIS — Z7984 Long term (current) use of oral hypoglycemic drugs: Secondary | ICD-10-CM | POA: Diagnosis not present

## 2020-05-13 DIAGNOSIS — I10 Essential (primary) hypertension: Secondary | ICD-10-CM | POA: Diagnosis not present

## 2020-05-13 DIAGNOSIS — E11621 Type 2 diabetes mellitus with foot ulcer: Secondary | ICD-10-CM | POA: Diagnosis not present

## 2020-05-13 DIAGNOSIS — F32A Depression, unspecified: Secondary | ICD-10-CM | POA: Diagnosis not present

## 2020-05-13 DIAGNOSIS — Z9013 Acquired absence of bilateral breasts and nipples: Secondary | ICD-10-CM | POA: Diagnosis not present

## 2020-05-13 DIAGNOSIS — E1169 Type 2 diabetes mellitus with other specified complication: Secondary | ICD-10-CM | POA: Diagnosis not present

## 2020-05-13 DIAGNOSIS — Z452 Encounter for adjustment and management of vascular access device: Secondary | ICD-10-CM | POA: Diagnosis not present

## 2020-05-13 DIAGNOSIS — Z79899 Other long term (current) drug therapy: Secondary | ICD-10-CM | POA: Diagnosis not present

## 2020-05-13 DIAGNOSIS — L97529 Non-pressure chronic ulcer of other part of left foot with unspecified severity: Secondary | ICD-10-CM | POA: Diagnosis not present

## 2020-05-13 DIAGNOSIS — M86172 Other acute osteomyelitis, left ankle and foot: Secondary | ICD-10-CM | POA: Diagnosis not present

## 2020-05-13 DIAGNOSIS — A4159 Other Gram-negative sepsis: Secondary | ICD-10-CM | POA: Diagnosis not present

## 2020-05-13 DIAGNOSIS — M1990 Unspecified osteoarthritis, unspecified site: Secondary | ICD-10-CM | POA: Diagnosis not present

## 2020-05-13 DIAGNOSIS — Z792 Long term (current) use of antibiotics: Secondary | ICD-10-CM | POA: Diagnosis not present

## 2020-05-13 DIAGNOSIS — F419 Anxiety disorder, unspecified: Secondary | ICD-10-CM | POA: Diagnosis not present

## 2020-05-13 DIAGNOSIS — Z794 Long term (current) use of insulin: Secondary | ICD-10-CM | POA: Diagnosis not present

## 2020-05-13 DIAGNOSIS — C50919 Malignant neoplasm of unspecified site of unspecified female breast: Secondary | ICD-10-CM | POA: Diagnosis not present

## 2020-05-14 DIAGNOSIS — K219 Gastro-esophageal reflux disease without esophagitis: Secondary | ICD-10-CM | POA: Diagnosis not present

## 2020-05-14 DIAGNOSIS — D500 Iron deficiency anemia secondary to blood loss (chronic): Secondary | ICD-10-CM | POA: Diagnosis not present

## 2020-05-14 DIAGNOSIS — T402X5A Adverse effect of other opioids, initial encounter: Secondary | ICD-10-CM | POA: Diagnosis not present

## 2020-05-14 DIAGNOSIS — R195 Other fecal abnormalities: Secondary | ICD-10-CM | POA: Diagnosis not present

## 2020-05-14 DIAGNOSIS — K7460 Unspecified cirrhosis of liver: Secondary | ICD-10-CM | POA: Diagnosis not present

## 2020-05-14 DIAGNOSIS — K2961 Other gastritis with bleeding: Secondary | ICD-10-CM | POA: Diagnosis not present

## 2020-05-14 DIAGNOSIS — K5903 Drug induced constipation: Secondary | ICD-10-CM | POA: Diagnosis not present

## 2020-05-15 DIAGNOSIS — M86172 Other acute osteomyelitis, left ankle and foot: Secondary | ICD-10-CM | POA: Diagnosis not present

## 2020-05-15 DIAGNOSIS — A419 Sepsis, unspecified organism: Secondary | ICD-10-CM | POA: Diagnosis not present

## 2020-05-18 ENCOUNTER — Telehealth: Payer: Self-pay

## 2020-05-18 ENCOUNTER — Inpatient Hospital Stay: Payer: PPO

## 2020-05-18 ENCOUNTER — Inpatient Hospital Stay: Payer: PPO | Admitting: Hematology and Oncology

## 2020-05-18 DIAGNOSIS — A4159 Other Gram-negative sepsis: Secondary | ICD-10-CM | POA: Diagnosis not present

## 2020-05-18 NOTE — Progress Notes (Signed)
Chronic Care Management Pharmacy Assistant   Name: Linda [name]  MRN: [id number] DOB: 1970-07-06   Reason for Encounter: Medication Review for [facility] acute delivery    Medications: Outpatient Encounter Medications as of 05/18/2020  Medication Sig Note  . Blood Glucose Monitoring Suppl (ONETOUCH VERIO REFLECT) w/Device KIT AS DIRECTED   . buPROPion (WELLBUTRIN XL) 300 MG 24 hr tablet Take 1 tablet (300 mg total) by mouth every evening.   . calcium citrate-vitamin D (CITRACAL+D) 315-200 MG-UNIT tablet Take 1 tablet by mouth 2 (two) times daily.   . clotrimazole-betamethasone (LOTRISONE) cream Apply small amount to affected area twice daily (Patient taking differently: as needed. Apply small amount to affected area twice daily)   . Continuous Blood Gluc Receiver (FREESTYLE LIBRE 2 READER) DEVI E11.69 Check blood sugar 4 times daily as directed   . Continuous Blood Gluc Sensor (FREESTYLE LIBRE 2 SENSOR) MISC E11.69 Change sensor every 14 days as directed   . cyclobenzaprine (FLEXERIL) 10 MG tablet TAKE ONE TABLET BY MOUTH EVERY 8 HOURS AS NEEDED FOR MUSCLE SPASMS   . dicyclomine (BENTYL) 20 MG tablet TAKE ONE TABLET BY MOUTH BEFORE MEALS AND AT BEDTIME AS NEEDED FOR STOMACH CRAMPING   . Dulaglutide (TRULICITY) 4.78 GN/5.6OZ SOPN INJECT ONE SYRINGE ONCE A WEEK   . Empagliflozin-metFORMIN HCl (SYNJARDY) 12.05-998 MG TABS Take 1 tablet by mouth 2 (two) times daily.   . famotidine (PEPCID) 20 MG tablet Take 1 tablet (20 mg total) by mouth 2 (two) times daily.   . ferrous sulfate 325 (65 FE) MG tablet Take 325 mg by mouth every evening.   Marland Kitchen FETZIMA 80 MG CP24 TAKE ONE CAPSULE BY MOUTH ONCE DAILY   . gabapentin (NEURONTIN) 300 MG capsule Take 1 capsule (300 mg total) by mouth 3 (three) times daily.   . insulin glargine (LANTUS SOLOSTAR) 100 UNIT/ML Solostar Pen INJECT 68 UNITS SUBCUTANEOUSLY EVERYDAY AT BEDTIME   . Insulin Pen Needle (BD PEN NEEDLE NANO U/F) 32G X 4 MM MISC Inject 1  each into the skin in the morning and at bedtime. (Patient not taking: Reported on 04/23/2020)   . levothyroxine (SYNTHROID) 75 MCG tablet Take 1 tablet (75 mcg total) by mouth daily.   Marland Kitchen loratadine (CLARITIN) 10 MG tablet Take 10 mg by mouth daily. Taking 3-4 days each week due to bone pain associated with shot from cancer center.   Marland Kitchen LORazepam (ATIVAN) 0.5 MG tablet TAKE ONE TABLET BY MOUTH TWICE DAILY AS NEEDED FOR ANXIETY   . losartan (COZAAR) 50 MG tablet Take 1 tablet (50 mg total) by mouth every evening.   . Magnesium 500 MG CAPS 500 mg.   . morphine (MS CONTIN) 30 MG 12 hr tablet Take 1 tablet (30 mg total) by mouth every 12 (twelve) hours.   . Multiple Vitamin (MULTIVITAMIN WITH MINERALS) TABS tablet Take 1 tablet by mouth daily.  06/08/2018: Pt plans on purchasing again when she can get out.  . ondansetron (ZOFRAN) 4 MG tablet Take 1 tablet (4 mg total) by mouth every 4 (four) hours as needed for nausea.   Glory Rosebush Delica Lancets 30Q MISC 1 each by Does not apply route daily before breakfast. Check blood sugar twice daily.   . pantoprazole (PROTONIX) 40 MG tablet Take 1 tablet (40 mg total) by mouth 2 (two) times daily.   . Pegfilgrastim-jmdb (FULPHILA [place]) Inject into the skin.   . potassium chloride (KLOR-CON) 20 MEQ packet 20 mEq in the morning and at bedtime.   Marland Kitchen  pravastatin (PRAVACHOL) 20 MG tablet Take 20 mg by mouth at bedtime.   . prochlorperazine (COMPAZINE) 10 MG tablet Take 1 tablet (10 mg total) by mouth every 6 (six) hours as needed for nausea or vomiting.   Marland Kitchen rOPINIRole (REQUIP) 0.25 MG tablet Take 1 tablet (0.25 mg total) by mouth at bedtime.   . senna (SENOKOT) 8.6 MG tablet Take 1 tablet by mouth daily as needed for constipation.   . SODIUM FLUORIDE 5000 SENSITIVE 1.1-5 % GEL Take by mouth 2 (two) times daily.   Marland Kitchen Specialty Vitamins Products (MAGNESIUM, AMINO ACID CHELATE,) 133 MG tablet Take 1 tablet by mouth at bedtime.   . sucralfate (CARAFATE) 1 g tablet Take 1 tablet  (1 g total) by mouth 4 (four) times daily -  with meals and at bedtime.   . Vitamin D, Ergocalciferol, (DRISDOL) 1.25 MG (50000 UNIT) CAPS capsule TAKE ONE CAPSULE BY MOUTH ONCE WEEKLY ON FRIDAY   . zolpidem (AMBIEN) 10 MG tablet TAKE ONE TABLET BY MOUTH EVERYDAY AT BEDTIME AS NEEDED    No facility-administered encounter medications on file as of 05/18/2020.    Patient left a message for Donette Larry, CPP stating she was missing 3 medications from her delivery.  Gabapentin, Synjardy, and her Ropinirole she only received 13 pills.  She has previous supply of Synjardy and Gabapentin and when her med sync was done she didn't think she needed them, after looking today she is going to need them.  I have requested refills from Donette Larry, CPP  I called [facility] to check on the Ropinirole, it was there mistake they were only going to send enough to get to her next sync date, but they miscalculated.  I was instructed to put that on the acute form as well.  I have filled out acute form and place in Donette Larry, [place] folder for approval  Clarita Leber, [place] Pharmacist Assistant [phone]

## 2020-05-19 ENCOUNTER — Other Ambulatory Visit: Payer: Self-pay | Admitting: Family Medicine

## 2020-05-19 DIAGNOSIS — Z1501 Genetic susceptibility to malignant neoplasm of breast: Secondary | ICD-10-CM

## 2020-05-20 ENCOUNTER — Telehealth: Payer: Self-pay | Admitting: Oncology

## 2020-05-20 ENCOUNTER — Inpatient Hospital Stay: Payer: PPO | Attending: Oncology

## 2020-05-20 ENCOUNTER — Other Ambulatory Visit: Payer: Self-pay

## 2020-05-20 ENCOUNTER — Inpatient Hospital Stay (INDEPENDENT_AMBULATORY_CARE_PROVIDER_SITE_OTHER): Payer: PPO | Admitting: Hematology and Oncology

## 2020-05-20 ENCOUNTER — Encounter: Payer: Self-pay | Admitting: Hematology and Oncology

## 2020-05-20 VITALS — BP 131/65 | HR 92 | Temp 98.5°F | Resp 18 | Ht 62.0 in | Wt 182.7 lb

## 2020-05-20 DIAGNOSIS — C50311 Malignant neoplasm of lower-inner quadrant of right female breast: Secondary | ICD-10-CM

## 2020-05-20 DIAGNOSIS — D693 Immune thrombocytopenic purpura: Secondary | ICD-10-CM | POA: Insufficient documentation

## 2020-05-20 DIAGNOSIS — Z171 Estrogen receptor negative status [ER-]: Secondary | ICD-10-CM | POA: Insufficient documentation

## 2020-05-20 DIAGNOSIS — K7460 Unspecified cirrhosis of liver: Secondary | ICD-10-CM | POA: Insufficient documentation

## 2020-05-20 DIAGNOSIS — Z9013 Acquired absence of bilateral breasts and nipples: Secondary | ICD-10-CM | POA: Insufficient documentation

## 2020-05-20 DIAGNOSIS — C50811 Malignant neoplasm of overlapping sites of right female breast: Secondary | ICD-10-CM | POA: Diagnosis not present

## 2020-05-20 DIAGNOSIS — D649 Anemia, unspecified: Secondary | ICD-10-CM | POA: Diagnosis not present

## 2020-05-20 LAB — COMPREHENSIVE METABOLIC PANEL
Albumin: 4.5 (ref 3.5–5.0)
Calcium: 9.7 (ref 8.7–10.7)

## 2020-05-20 LAB — HEPATIC FUNCTION PANEL
ALT: 18 (ref 7–35)
AST: 31 (ref 13–35)
Alkaline Phosphatase: 74 (ref 25–125)
Bilirubin, Total: 0.7

## 2020-05-20 LAB — CBC AND DIFFERENTIAL
HCT: 39 (ref 36–46)
Hemoglobin: 12.5 (ref 12.0–16.0)
Neutrophils Absolute: 3.85
Platelets: 89 — AB (ref 150–399)
WBC: 5.2

## 2020-05-20 LAB — BASIC METABOLIC PANEL
BUN: 14 (ref 4–21)
CO2: 28 — AB (ref 13–22)
Chloride: 101 (ref 99–108)
Creatinine: 0.8 (ref 0.5–1.1)
Glucose: 154
Potassium: 4.2 (ref 3.4–5.3)
Sodium: 139 (ref 137–147)

## 2020-05-20 LAB — IRON AND TIBC
Iron: 80 ug/dL (ref 28–170)
Saturation Ratios: 26 % (ref 10.4–31.8)
TIBC: 312 ug/dL (ref 250–450)
UIBC: 232 ug/dL

## 2020-05-20 LAB — FERRITIN: Ferritin: 98 ng/mL (ref 11–307)

## 2020-05-20 LAB — CBC: RBC: 4.21 (ref 3.87–5.11)

## 2020-05-20 MED ORDER — PROCHLORPERAZINE MALEATE 10 MG PO TABS: 10.0000 mg | ORAL_TABLET | Freq: Four times a day (QID) | ORAL | 3 refills | Status: DC | PRN

## 2020-05-20 NOTE — Telephone Encounter (Signed)
Per 4/27 LOS, patient scheduled for 5/2 U/S Abdomen- Scheduled for July Appt's.  Gave patient Appt Summary/Orders

## 2020-05-20 NOTE — Progress Notes (Signed)
[name]  [address] [place],  [place]  [address] [phone]  Clinic Day:  05/20/2020  Referring physician: [name], MD  CHIEF COMPLAINT:  CC: A 50 year old with history of stage IB triple negative breast cancer, status post 4 cycles of Adriamycin/cyclophosphamide here for 1 month evaluation.  Current Treatment:   Weekly paclitaxel for 12 cycles; discontinued after 8 cycles due to osteomyelitis   HISTORY OF PRESENT ILLNESS:  Linda [name] is a 50 y.o. female who we began seeing in February 2018 for evaluation of anemia.  Her anemia was felt to be secondary to iron deficiency and we recommended continuation of oral iron supplementation for a total of 6 months, as well as referral back to the gastroenterologist.  The patient also has a history of thrombocytopenia and had previously seen Dr. Bobby Rumpf in 2014.  The thrombocytopenia was felt to be secondary to mild chronic immune thrombocytopenic purpura (ITP).  Due to the patient's family history of malignancy, she underwent testing for hereditary cancer syndromes with the [employer] myRisk Hereditary Cancer Panel test.  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  She underwent a robotic hysterectomy and bilateral salpingo-oophorectomy in May 2018 with Dr. Everitt Amber.  Pathology was benign.  We also discussed the option of risk reducing bilateral mastectomy, but she has chosen to have close surveillance, so we recommended annual MRI breast, in addition to mammography.  She was placed on chemoprevention with raloxifene in September 2018.  CT abdomen and pelvis in August 2020 done for bilateral flank pain, revealed a tiny left renal calculus, as well as probable hepatic cirrhosis without evidence of hepatic mass.  She was then referred to [facility] and  is followed by Roosevelt Locks, CRNP in [place] for her liver cirrhosis.  She tested negative for hepatitis A, B, and C.  She received her hepatitis vaccines in 2020.  We saw her in September 2021 for a new diagnosis of stage IB (T1c N0 M0) triple negative right breast cancer.  She underwent screening bilateral mammogram on August 3rd which revealed possible masses in the right breast.  Diagnostic right mammogram and right breast ultrasound from August 18th confirmed suspicious masses in the right breast at 5 o'clock measuring 1.5 cm and 6 o'clock measuring 1.4 cm.  There was an indeterminate 4 mm with possible distortion in the outer right breast without sonographic correlate.  Right axillary ultrasound was normal.  She then underwent biopsies of both masses and surgical pathology from these procedures revealed  invasive ductal carcinoma, grade 3, with necrosis at 5 o'clock; and invasive ductal carcinoma, grade 1-2, with necrosis and focal myxoid change at 6 o'clock.  No DCIS was identified.  Breast prognostic profiles revealed HER2, and estrogen and progesterone receptors to be negative. Ki67 was 30% at the 5 o'clock mass, and 40% at 6 o'clock.  She underwent bilateral mastectomies on September 24th with Dr. Noberto Retort. Surgical pathology from this procedure revealed invasive ductal carcinoma, grade 3, with myxoid change, 19 mm, at 6 o'clock, and invasive ductal carcinoma, grade 3, and ductal carcinoma in situ, 19 mm, at 5 o'clock.  All margins were negative for invasive carcinoma or DCIS.  Two sentinel lymph nodes were negative for metastatic carcinoma (0/2). CT chest, abdomen and pelvis in September did not reveal any evidence of metastatic disease.  She  started dose dense AC chemotherapy on October 25th , with plans to follow this with weekly paclitaxel for 12 doses.  She had severe restless leg syndrome with oral corticosteroids.  At her visit on November 29th prior to a 3rd cycle of AC chemotherapy she had  significantly worsened anemia with a hemoglobin of 8.7, so we decided to wait 3 weeks for her 4th cycle of chemotherapy.  Due to cytopenias, this had to be delayed at least 2 more weeks, but she finally got her final dose on January 4th.  Iron studies, B12 and folate were normal. Her anemia has slowly improved.  She started weekly paclitaxel on January 26th and has received 8 out of 12 planned doses.   She did have some expected pancytopenia.  She has pre-existing neuropathy of the feet and legs.    She was admitted to the hospital for osteomyelitis of the left 2nd toe.  She was treated with empiric vancomycin and Zosyn.  Amputation was recommended but the patient refused.  Cultures returned with Klebsiella, sensitive to IV Rocephin, so long term antibiotics 2 g Q24H will be continued for the next 6 weeks. At this time, we decided to discontinue weekly paclitaxel as she had received 8/12 cycles.   INTERVAL HISTORY:  Linda [name] is here for 1 month evaluation. She continues antibiotic therapy for osteomyelitis and will do so until May 1st at which time she will return to podiatry for reassessment. She states the wound is healing well and being dressed by Home Health. She is aware that she may need to continue antibiotics depending on exam next week. She is doing well from an oncology standpoint. She denies fever or chills. She does have nausea associated with the antibiotic therapy and uses zofran and compazine. She also has diarrhea associated with the antibiotics and Dr. Melina Copa is helping her manage that. She denies shortness of breath, chest pain or cough. She continues to have anxiety and depression for which she is medicated and states this is better since being out of the hospital. She is scheduled to see Dr. Melina Copa in a couple of months and is requesting iron studies added to her blood work today for that visit. CBC and CMP are unremarkable today.   REVIEW OF SYSTEMS:  Review of Systems  Constitutional:  Negative for appetite change, chills, diaphoresis, fatigue, fever and unexpected weight change.  HENT:   Negative for hearing loss, lump/mass, mouth sores, nosebleeds, sore throat, tinnitus, trouble swallowing and voice change.   Eyes: Negative for eye problems and icterus.  Respiratory: Negative for chest tightness, cough, hemoptysis, shortness of breath and wheezing.   Cardiovascular: Negative for chest pain, leg swelling and palpitations.  Gastrointestinal: Positive for diarrhea, nausea and vomiting. Negative for abdominal distention, abdominal pain, blood in stool, constipation and rectal pain.  Endocrine: Negative for hot flashes.  Genitourinary: Negative for bladder incontinence, difficulty urinating, dyspareunia, dysuria, frequency, hematuria and nocturia.   Musculoskeletal: Positive for gait problem. Negative for arthralgias, back pain, flank pain, myalgias, neck pain and neck stiffness.  Skin: Negative for itching, rash and wound.  Neurological: Positive for gait problem. Negative for dizziness, extremity weakness, headaches, light-headedness, numbness, seizures and speech difficulty.  Hematological: Negative for adenopathy. Does not bruise/bleed easily.  Psychiatric/Behavioral: Positive for depression. Negative for confusion, decreased concentration, sleep disturbance and suicidal ideas. The patient is nervous/anxious.     VITALS:  Blood pressure 131/65, pulse 92, temperature 98.5 F (36.9 C), temperature source Oral, resp. rate 18, height 5' 2"  (  1.575 m), weight 182 lb 11.2 oz (82.9 kg), last menstrual period 06/13/2009, SpO2 94 %.  Wt Readings from Last 3 Encounters:  05/20/20 182 lb 11.2 oz (82.9 kg)  04/23/20 184 lb (83.5 kg)  04/16/20 212 lb 11.2 oz (96.5 kg)    Body mass index is 33.42 kg/m.  Performance status (ECOG): 1 - Symptomatic but completely ambulatory  PHYSICAL EXAM:  Physical Exam Constitutional:      General: She is not in acute distress.    Appearance:  Normal appearance. She is normal weight. She is not ill-appearing, toxic-appearing or diaphoretic.  HENT:     Head: Normocephalic and atraumatic.     Nose: Nose normal. No congestion or rhinorrhea.     Mouth/Throat:     Mouth: Mucous membranes are moist.     Pharynx: Oropharynx is clear. No oropharyngeal exudate or posterior oropharyngeal erythema.  Eyes:     General: No scleral icterus.       Right eye: No discharge.        Left eye: No discharge.     Extraocular Movements: Extraocular movements intact.     Conjunctiva/sclera: Conjunctivae normal.     Pupils: Pupils are equal, round, and reactive to light.  Neck:     Vascular: No carotid bruit.  Cardiovascular:     Rate and Rhythm: Normal rate and regular rhythm.     Heart sounds: No murmur heard. No friction rub. No gallop.   Pulmonary:     Effort: Pulmonary effort is normal. No respiratory distress.     Breath sounds: Normal breath sounds. No stridor. No wheezing, rhonchi or rales.  Chest:     Chest wall: No tenderness.  Abdominal:     General: Abdomen is flat. Bowel sounds are normal. There is no distension.     Palpations: There is no mass.     Tenderness: There is no abdominal tenderness. There is no right CVA tenderness, left CVA tenderness, guarding or rebound.     Hernia: No hernia is present.  Musculoskeletal:        General: No swelling, tenderness, deformity or signs of injury. Normal range of motion.     Cervical back: Normal range of motion and neck supple. No rigidity or tenderness.     Right lower leg: No edema.     Left lower leg: No edema.  Lymphadenopathy:     Cervical: No cervical adenopathy.  Skin:    General: Skin is warm and dry.     Capillary Refill: Capillary refill takes less than 2 seconds.     Coloration: Skin is not jaundiced or pale.     Findings: No bruising, erythema, lesion or rash.  Neurological:     General: No focal deficit present.     Mental Status: She is alert and oriented to person,  place, and time. Mental status is at baseline.     Cranial Nerves: No cranial nerve deficit.     Sensory: No sensory deficit.     Motor: No weakness.     Coordination: Coordination normal.     Gait: Gait normal.     Deep Tendon Reflexes: Reflexes normal.  Psychiatric:        Mood and Affect: Mood normal.        Behavior: Behavior normal.        Thought Content: Thought content normal.        Judgment: Judgment normal.    LABS:   CBC Latest Ref Rng & Units  05/20/2020 04/23/2020 04/16/2020  WBC - 5.2 5.5 5.0  Hemoglobin 12.0 - 16.0 12.5 11.3 10.7(A)  Hematocrit 36 - 46 39 35.5 33(A)  Platelets 150 - 399 89(A) 128(L) 92(A)   CMP Latest Ref Rng & Units 05/20/2020 04/23/2020 04/16/2020  Glucose 65 - 99 mg/dL - 141(H) -  BUN 4 - 21 14 11 10   Creatinine 0.5 - 1.1 0.8 1.25(H) 1.2(A)  Sodium 137 - 147 139 142 138  Potassium 3.4 - 5.3 4.2 4.6 4.5  Chloride 99 - 108 101 102 107  CO2 13 - 22 28(A) 22 23(A)  Calcium 8.7 - 10.7 9.7 9.2 9.5  Total Protein 6.0 - 8.5 g/dL - 6.8 6.1(A)  Total Bilirubin 0.0 - 1.2 mg/dL - 0.5 -  Alkaline Phos 25 - 125 74 94 89  AST 13 - 35 31 24 30   ALT 7 - 35 18 14 21       Lab Results  Component Value Date   TIBC 300 01/13/2020   IRONPCTSAT 24.3 01/13/2020   No results found for: LDH  STUDIES:    HISTORY:   Allergies:  Allergies  Allergen Reactions  . Codeine Shortness Of Breath    Other reaction(s): SHOB  . Celecoxib Other (See Comments)    Unknown reaction Other reaction(s): Unknown  . Ezetimibe     Other reaction(s): Unknown  . Ezetimibe-Simvastatin Other (See Comments)    Unknown reaction  . Propranolol     Other reaction(s): Unknown  . Propranolol Hcl Other (See Comments)    Unknown reaction  . Simvastatin     Other reaction(s): Unknown    Current Medications: Current Outpatient Medications  Medication Sig Dispense Refill  . prochlorperazine (COMPAZINE) 10 MG tablet Take 1 tablet (10 mg total) by mouth every 6 (six) hours as  needed for nausea or vomiting. 90 tablet 3  . Blood Glucose Monitoring Suppl (ONETOUCH VERIO REFLECT) w/Device KIT AS DIRECTED    . buPROPion (WELLBUTRIN XL) 300 MG 24 hr tablet Take 1 tablet (300 mg total) by mouth every evening. 90 tablet 1  . calcium citrate-vitamin D (CITRACAL+D) 315-200 MG-UNIT tablet Take 1 tablet by mouth 2 (two) times daily.    . clotrimazole-betamethasone (LOTRISONE) cream Apply small amount to affected area twice daily (Patient taking differently: as needed. Apply small amount to affected area twice daily) 45 g 0  . Continuous Blood Gluc Receiver (FREESTYLE LIBRE 2 READER) DEVI E11.69 Check blood sugar 4 times daily as directed 1 each 0  . Continuous Blood Gluc Sensor (FREESTYLE LIBRE 2 SENSOR) MISC E11.69 Change sensor every 14 days as directed 6 each 3  . cyclobenzaprine (FLEXERIL) 10 MG tablet TAKE ONE TABLET BY MOUTH EVERY 8 HOURS AS NEEDED FOR MUSCLE SPASMS 30 tablet 3  . dicyclomine (BENTYL) 20 MG tablet TAKE ONE TABLET BY MOUTH BEFORE MEALS AND AT BEDTIME AS NEEDED FOR STOMACH CRAMPING 180 tablet 1  . Dulaglutide (TRULICITY) 1.61 WR/6.0AV SOPN INJECT ONE SYRINGE ONCE A WEEK 6 mL 1  . Empagliflozin-metFORMIN HCl (SYNJARDY) 12.05-998 MG TABS Take 1 tablet by mouth 2 (two) times daily. 180 tablet 0  . famotidine (PEPCID) 20 MG tablet Take 1 tablet (20 mg total) by mouth 2 (two) times daily. 180 tablet 1  . ferrous sulfate 325 (65 FE) MG tablet Take 325 mg by mouth every evening.    Marland Kitchen FETZIMA 80 MG CP24 TAKE ONE CAPSULE BY MOUTH ONCE DAILY 90 capsule 1  . gabapentin (NEURONTIN) 300 MG capsule TAKE ONE CAPSULE BY  MOUTH THREE TIMES DAILY 270 capsule 0  . insulin glargine (LANTUS SOLOSTAR) 100 UNIT/ML Solostar Pen INJECT 68 UNITS SUBCUTANEOUSLY EVERYDAY AT BEDTIME 15 mL 2  . Insulin Pen Needle (BD PEN NEEDLE NANO U/F) 32G X 4 MM MISC Inject 1 each into the skin in the morning and at bedtime. (Patient not taking: Reported on 04/23/2020) 200 each 3  . levothyroxine  (SYNTHROID) 75 MCG tablet Take 1 tablet (75 mcg total) by mouth daily. 90 tablet 1  . loratadine (CLARITIN) 10 MG tablet Take 10 mg by mouth daily. Taking 3-4 days each week due to bone pain associated with shot from cancer center.    Marland Kitchen LORazepam (ATIVAN) 0.5 MG tablet TAKE ONE TABLET BY MOUTH TWICE DAILY AS NEEDED FOR ANXIETY 30 tablet 2  . losartan (COZAAR) 50 MG tablet Take 1 tablet (50 mg total) by mouth every evening. 90 tablet 1  . Magnesium 500 MG CAPS 500 mg.    . morphine (MS CONTIN) 30 MG 12 hr tablet Take 1 tablet (30 mg total) by mouth every 12 (twelve) hours. 60 tablet 0  . Multiple Vitamin (MULTIVITAMIN WITH MINERALS) TABS tablet Take 1 tablet by mouth daily.     . ondansetron (ZOFRAN) 4 MG tablet Take 1 tablet (4 mg total) by mouth every 4 (four) hours as needed for nausea. 90 tablet 3  . OneTouch Delica Lancets 01S MISC 1 each by Does not apply route daily before breakfast. Check blood sugar twice daily. 100 each 3  . pantoprazole (PROTONIX) 40 MG tablet Take 1 tablet (40 mg total) by mouth 2 (two) times daily. 180 tablet 1  . Pegfilgrastim-jmdb (FULPHILA Temple) Inject into the skin.    . potassium chloride (KLOR-CON) 20 MEQ packet 20 mEq in the morning and at bedtime.    . pravastatin (PRAVACHOL) 20 MG tablet Take 20 mg by mouth at bedtime.    . prochlorperazine (COMPAZINE) 10 MG tablet Take 1 tablet (10 mg total) by mouth every 6 (six) hours as needed for nausea or vomiting. 90 tablet 3  . rOPINIRole (REQUIP) 0.25 MG tablet Take 1 tablet (0.25 mg total) by mouth at bedtime. 30 tablet 2  . senna (SENOKOT) 8.6 MG tablet Take 1 tablet by mouth daily as needed for constipation.    . SODIUM FLUORIDE 5000 SENSITIVE 1.1-5 % GEL Take by mouth 2 (two) times daily.    Marland Kitchen Specialty Vitamins Products (MAGNESIUM, AMINO ACID CHELATE,) 133 MG tablet Take 1 tablet by mouth at bedtime.    . sucralfate (CARAFATE) 1 g tablet Take 1 tablet (1 g total) by mouth 4 (four) times daily -  with meals and at  bedtime. 270 tablet 1  . Vitamin D, Ergocalciferol, (DRISDOL) 1.25 MG (50000 UNIT) CAPS capsule TAKE ONE CAPSULE BY MOUTH ONCE WEEKLY ON FRIDAY 12 capsule 1  . zolpidem (AMBIEN) 10 MG tablet TAKE ONE TABLET BY MOUTH EVERYDAY AT BEDTIME AS NEEDED 30 tablet 5   No current facility-administered medications for this visit.     ASSESSMENT & PLAN:   Assessment: 1. Positive BRCA 1 mutation, which increases her risk for breast and ovarian cancer, as well as elevates her risk for pancreatic cancer, for which she underwent hysterectomy/bilateral salpingo-oophorectomy and is on chemoprevention with raloxifene and regular surveillance.    2.  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  She has undergone bilateral mastectomies.  She completed 4 cycles of Adriamycin/cyclophosphamide chemotherapy and has now received 8 out of 12 planned weeks of weekly paclitaxel.  3.  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  We feel this is a separate primary.  She has undergone bilateral mastectomies.  She completed 4 cycles of Adriamycin/cyclophosphamide chemotherapy and was receiving weekly paclitaxel but we will plan to discontinue this.  She has received 8 cycles.  4. Thrombocytopenia, which is felt to be due to chronic ITP. Platelets stable at 89.  5. Liver cirrhosis as seen on CT imaging in August 2020.  She has been referred and is being seen by Roosevelt Locks, CRNP, at the [facility] in [place].  Hepatitis panel was negative and she has received the appropriate vaccines.  Abdominal ultrasound from April 2021 was stable and CT imaging from September 2021 was stable. We will arrange for repeat abdominal ultrasound.    6.  Osteomyelitis of the left 2nd  toe, being treated with long term antibiotics Rocephin IV 2 g Q24H for the next 6 weeks. She will complete therapy May 1st and follow with podiatry to assess if she needs another course of IV antibiotics. Home Health is dressing the wound weekly and she states it is healing well.    Plan:   She will proceed with antibiotic therapy per podiatry. I will order the abdominal ultrasound and we will plan to discuss these results over the phone. She will see Dr. Melina Copa in a couple of months. I will obtain iron studies and forward to Dr. Melina Copa for that appointment. I will refill her compazine today. We will see her back in 3 months for repeat CBC, CMP and clinical evaluation.   She verbalizes understanding of and agreement to the plans discussed today. She knows to call the office should any new questions or concerns arise.    Melodye Ped, NP

## 2020-05-22 DIAGNOSIS — M86172 Other acute osteomyelitis, left ankle and foot: Secondary | ICD-10-CM | POA: Diagnosis not present

## 2020-05-22 DIAGNOSIS — A419 Sepsis, unspecified organism: Secondary | ICD-10-CM | POA: Diagnosis not present

## 2020-05-26 ENCOUNTER — Other Ambulatory Visit: Payer: Self-pay

## 2020-05-26 ENCOUNTER — Encounter: Payer: Self-pay | Admitting: Sports Medicine

## 2020-05-26 ENCOUNTER — Ambulatory Visit: Payer: PPO | Admitting: Sports Medicine

## 2020-05-26 DIAGNOSIS — T451X5A Adverse effect of antineoplastic and immunosuppressive drugs, initial encounter: Secondary | ICD-10-CM

## 2020-05-26 DIAGNOSIS — E08621 Diabetes mellitus due to underlying condition with foot ulcer: Secondary | ICD-10-CM

## 2020-05-26 DIAGNOSIS — L97521 Non-pressure chronic ulcer of other part of left foot limited to breakdown of skin: Secondary | ICD-10-CM

## 2020-05-26 DIAGNOSIS — L97401 Non-pressure chronic ulcer of unspecified heel and midfoot limited to breakdown of skin: Secondary | ICD-10-CM | POA: Diagnosis not present

## 2020-05-26 DIAGNOSIS — Z8739 Personal history of other diseases of the musculoskeletal system and connective tissue: Secondary | ICD-10-CM

## 2020-05-26 DIAGNOSIS — D6959 Other secondary thrombocytopenia: Secondary | ICD-10-CM

## 2020-05-26 DIAGNOSIS — R600 Localized edema: Secondary | ICD-10-CM

## 2020-05-26 NOTE — Progress Notes (Signed)
Subjective: Linda [name] is a 50 y.o. female patient seen in office for follow-up evaluation of ulceration of the left 2nd toe after discharge from [facility] on antibiotics for osteomyelitis. Patient has a history of diabetes and a blood glucose level today of 111 mg/dl, Denies nausea/fever/vomiting/chills/night sweats/shortness of breath but does admit that she had one episode of nausea.  Reports home nurse states that the wound is doing good and has finished PICC line antibiotics as of Sunday and PICC line has been removed.   Patient Active Problem List   Diagnosis Date Noted  . Acute postoperative respiratory insufficiency 04/17/2020  . AKI (acute kidney injury) ([place]) 04/17/2020  . 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 Osteomyelitis ([place]) 04/17/2020  . 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 Dehydration 03/16/2020  . Hypokalemia 03/09/2020  . Chemotherapy-induced thrombocytopenia 02/06/2020  . Thrombocytopenia, unspecified ([place]) 01/27/2020  . Malignant neoplasm of lower-inner quadrant of right breast of female, estrogen receptor negative ([place]) 10/01/2019  . Chronic pain syndrome 07/08/2019  . Memory loss 07/08/2019  . Depression, major, recurrent, mild ([place]) 07/08/2019  . Uncomplicated opioid dependence ([place]) 07/08/2019  . Mixed hyperlipidemia 04/11/2019  . Dyslipidemia associated with type 2 diabetes mellitus ([place]) 04/11/2019  . Essential hypertension, benign 04/11/2019  . Major depressive disorder, single episode, mild ([place]) 04/11/2019  . Acquired hypothyroidism 04/11/2019  . Vitamin D insufficiency 04/11/2019  . Class 2 severe obesity due to excess calories with serious comorbidity and body mass index (BMI) of 35.0 to 35.9 in adult ([place]) 04/11/2019  . Pre-ulcerative calluses 03/04/2019  . Liver cirrhosis secondary to NASH (nonalcoholic steatohepatitis) ([place]) 09/13/2018     Class: Chronic  . BRCA1 positive 06/14/2016   Current Outpatient Medications on File Prior to Visit  Medication Sig Dispense Refill  . Blood Glucose Monitoring Suppl (ONETOUCH VERIO REFLECT) w/Device KIT AS DIRECTED    . buPROPion (WELLBUTRIN XL) 300 MG 24 hr tablet Take 1 tablet (300 mg total) by mouth every evening. 90 tablet 1  . calcium citrate-vitamin D (CITRACAL+D) 315-200 MG-UNIT tablet Take 1 tablet by mouth 2 (two) times daily.    . clotrimazole-betamethasone (LOTRISONE) cream Apply small amount to affected area twice daily (Patient taking differently: as needed. Apply small amount to affected area twice daily) 45 g 0  . Continuous Blood Gluc Receiver (FREESTYLE LIBRE 2 READER) DEVI E11.69 Check blood sugar 4 times daily as directed 1 each 0  . Continuous Blood Gluc Sensor (FREESTYLE LIBRE 2 SENSOR) MISC E11.69 Change sensor every 14 days as directed 6 each 3  . cyclobenzaprine (FLEXERIL) 10 MG tablet TAKE ONE TABLET BY MOUTH EVERY 8 HOURS AS NEEDED FOR MUSCLE SPASMS 30 tablet 3  . dicyclomine (BENTYL) 20 MG tablet TAKE ONE TABLET BY MOUTH BEFORE MEALS AND AT BEDTIME AS NEEDED FOR STOMACH CRAMPING 180 tablet 1  . Dulaglutide (TRULICITY) 6.38 VF/6.4PP SOPN INJECT ONE SYRINGE ONCE A WEEK 6 mL 1  . Empagliflozin-metFORMIN HCl (SYNJARDY) 12.05-998 MG TABS Take 1 tablet by mouth 2 (two) times daily. 180 tablet 0  . famotidine (PEPCID) 20 MG tablet Take 1 tablet (20 mg total) by mouth 2 (two) times daily. 180 tablet 1  . ferrous sulfate 325 (65 FE) MG tablet Take 325 mg by mouth every evening.    Marland Kitchen FETZIMA 80 MG CP24 TAKE ONE CAPSULE BY MOUTH ONCE DAILY 90 capsule 1  . gabapentin (NEURONTIN) 300 MG capsule  TAKE ONE CAPSULE BY MOUTH THREE TIMES DAILY 270 capsule 0  . insulin glargine (LANTUS SOLOSTAR) 100 UNIT/ML Solostar Pen INJECT 68 UNITS SUBCUTANEOUSLY EVERYDAY AT BEDTIME 15 mL 2  . Insulin Pen Needle (BD PEN NEEDLE NANO U/F) 32G X 4 MM MISC Inject 1 each into the skin in the morning and  at bedtime. (Patient not taking: Reported on 04/23/2020) 200 each 3  . levothyroxine (SYNTHROID) 75 MCG tablet Take 1 tablet (75 mcg total) by mouth daily. 90 tablet 1  . loratadine (CLARITIN) 10 MG tablet Take 10 mg by mouth daily. Taking 3-4 days each week due to bone pain associated with shot from cancer center.    Marland Kitchen LORazepam (ATIVAN) 0.5 MG tablet TAKE ONE TABLET BY MOUTH TWICE DAILY AS NEEDED FOR ANXIETY 30 tablet 2  . losartan (COZAAR) 50 MG tablet Take 1 tablet (50 mg total) by mouth every evening. 90 tablet 1  . Magnesium 500 MG CAPS 500 mg.    . morphine (MS CONTIN) 30 MG 12 hr tablet Take 1 tablet (30 mg total) by mouth every 12 (twelve) hours. 60 tablet 0  . Multiple Vitamin (MULTIVITAMIN WITH MINERALS) TABS tablet Take 1 tablet by mouth daily.     . ondansetron (ZOFRAN) 4 MG tablet Take 1 tablet (4 mg total) by mouth every 4 (four) hours as needed for nausea. 90 tablet 3  . OneTouch Delica Lancets 62V MISC 1 each by Does not apply route daily before breakfast. Check blood sugar twice daily. 100 each 3  . pantoprazole (PROTONIX) 40 MG tablet Take 1 tablet (40 mg total) by mouth 2 (two) times daily. 180 tablet 1  . Pegfilgrastim-jmdb (FULPHILA [place]) Inject into the skin.    . potassium chloride (KLOR-CON) 20 MEQ packet 20 mEq in the morning and at bedtime.    . pravastatin (PRAVACHOL) 20 MG tablet Take 20 mg by mouth at bedtime.    . prochlorperazine (COMPAZINE) 10 MG tablet Take 1 tablet (10 mg total) by mouth every 6 (six) hours as needed for nausea or vomiting. 90 tablet 3  . prochlorperazine (COMPAZINE) 10 MG tablet Take 1 tablet (10 mg total) by mouth every 6 (six) hours as needed for nausea or vomiting. 90 tablet 3  . rOPINIRole (REQUIP) 0.25 MG tablet Take 1 tablet (0.25 mg total) by mouth at bedtime. 30 tablet 2  . senna (SENOKOT) 8.6 MG tablet Take 1 tablet by mouth daily as needed for constipation.    . SODIUM FLUORIDE 5000 SENSITIVE 1.1-5 % GEL Take by mouth 2 (two) times daily.     Marland Kitchen Specialty Vitamins Products (MAGNESIUM, AMINO ACID CHELATE,) 133 MG tablet Take 1 tablet by mouth at bedtime.    . sucralfate (CARAFATE) 1 g tablet Take 1 tablet (1 g total) by mouth 4 (four) times daily -  with meals and at bedtime. 270 tablet 1  . Vitamin D, Ergocalciferol, (DRISDOL) 1.25 MG (50000 UNIT) CAPS capsule TAKE ONE CAPSULE BY MOUTH ONCE WEEKLY ON FRIDAY 12 capsule 1  . zolpidem (AMBIEN) 10 MG tablet TAKE ONE TABLET BY MOUTH EVERYDAY AT BEDTIME AS NEEDED 30 tablet 5   No current facility-administered medications on file prior to visit.   Allergies  Allergen Reactions  . Codeine Shortness Of Breath    Other reaction(s): SHOB  . Celecoxib Other (See Comments)    Unknown reaction Other reaction(s): Unknown  . Ezetimibe     Other reaction(s): Unknown  . Ezetimibe-Simvastatin Other (See Comments)    Unknown  reaction  . Propranolol     Other reaction(s): Unknown  . Propranolol Hcl Other (See Comments)    Unknown reaction  . Simvastatin     Other reaction(s): Unknown      Objective: There were no vitals filed for this visit.  General: Patient is awake, alert, oriented x 3 and in no acute distress.  Dermatology: Skin is warm and dry bilateral with a full thickness ulceration present  Left second toe. Ulceration measures 0.2 cm x 0.1 cm x 0.1 cm. There is a  Keratotic border with a granular base. The ulceration does not probe to bone. There is no malodor, no active drainage, decreased erythema, no edema to the toe but the lower extremity edema is doing better with use of compression like previous. No acute signs of infection.   Vascular: Dorsalis Pedis pulse = 1/4 Bilateral,  Posterior Tibial pulse = 1/4 Bilateral,  Capillary Fill Time < 5 seconds  Neurologic: Protective severely diminished bilateral.  Musculosketal: There is no pain to palpation to left second toe ulcer.  There is minimal pain to palpation to swollen legs bilateral.  Bevelyn Buckles' sign negative no acute  concerns on the right for acute DVT at this time.    No results for input(s): GRAMSTAIN, LABORGA in the last 8760 hours.  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 Wound measures post debridement as above.  Wound was debrided to the level of the dermis with viable wound base exposed to promote healing. Hemostasis was achieved with manuel pressure. Patient tolerated procedure well without any discomfort or anesthesia necessary for this wound debridement.  -Applied Medihoney and dry sterile dressing and instructed patient to continue with daily dressings at home consisting of same -Continue with compression garments to assist with edema control; new Surgigrip compression sleeve provided at this visit -Patient to return to office in 3 weeks for follow up care and evaluation or sooner if problems arise.  Landis Martins, DPM

## 2020-05-29 ENCOUNTER — Telehealth: Payer: Self-pay

## 2020-05-29 NOTE — Progress Notes (Signed)
Chronic Care Management Pharmacy Assistant   Name: Lindsy Cerullo  MRN: [id number] DOB: 30-Sep-1970   Reason for Encounter: Medication coordination for [facility]   Recent office visits:   05/26/20-podiatry, Diabetic ulcer of toe of left foot associated with diabetes mellitus due to underlying condition, limited to breakdown of skin-  05/20/20-Oncology-Labs, prochlorperazine (COMPAZINE) 10 MG tablet,  She will proceed with antibiotic therapy per podiatry. I will order the abdominal ultrasound and we will plan to discuss these results over the phone. She will see Dr. Melina Copa in a couple of months. I will obtain iron studies and forward to Dr. Melina Copa for that appointment. I will refill her compazine today. We will see her back in 3 months for repeat CBC, CMP and clinical evaluation.   05/01/20-podiatry-wound check, Continue with PICC line antibiotics long-term for history of osteomyelitis left second toe has approximately 2 more weeks left, Continue with compression garments to assist with edema control -Patient to return to office in 2-3 weeks for follow up care and evaluation or sooner if problems arise.  Recent consult visits:  none  Hospital visits:  March 2022 for Sepsis for Ulcer on toe  Medications: Outpatient Encounter Medications as of 05/29/2020  Medication Sig Note  . Blood Glucose Monitoring Suppl (ONETOUCH VERIO REFLECT) w/Device KIT AS DIRECTED   . buPROPion (WELLBUTRIN XL) 300 MG 24 hr tablet Take 1 tablet (300 mg total) by mouth every evening.   . calcium citrate-vitamin D (CITRACAL+D) 315-200 MG-UNIT tablet Take 1 tablet by mouth 2 (two) times daily.   . clotrimazole-betamethasone (LOTRISONE) cream Apply small amount to affected area twice daily (Patient taking differently: as needed. Apply small amount to affected area twice daily)   . Continuous Blood Gluc Receiver (FREESTYLE LIBRE 2 READER) DEVI E11.69 Check blood sugar 4 times daily as directed   . Continuous Blood  Gluc Sensor (FREESTYLE LIBRE 2 SENSOR) MISC E11.69 Change sensor every 14 days as directed   . cyclobenzaprine (FLEXERIL) 10 MG tablet TAKE ONE TABLET BY MOUTH EVERY 8 HOURS AS NEEDED FOR MUSCLE SPASMS   . dicyclomine (BENTYL) 20 MG tablet TAKE ONE TABLET BY MOUTH BEFORE MEALS AND AT BEDTIME AS NEEDED FOR STOMACH CRAMPING   . Dulaglutide (TRULICITY) 0.62 BJ/6.2GB SOPN INJECT ONE SYRINGE ONCE A WEEK   . Empagliflozin-metFORMIN HCl (SYNJARDY) 12.05-998 MG TABS Take 1 tablet by mouth 2 (two) times daily.   . famotidine (PEPCID) 20 MG tablet Take 1 tablet (20 mg total) by mouth 2 (two) times daily.   . ferrous sulfate 325 (65 FE) MG tablet Take 325 mg by mouth every evening.   Marland Kitchen FETZIMA 80 MG CP24 TAKE ONE CAPSULE BY MOUTH ONCE DAILY   . gabapentin (NEURONTIN) 300 MG capsule TAKE ONE CAPSULE BY MOUTH THREE TIMES DAILY   . insulin glargine (LANTUS SOLOSTAR) 100 UNIT/ML Solostar Pen INJECT 68 UNITS SUBCUTANEOUSLY EVERYDAY AT BEDTIME   . Insulin Pen Needle (BD PEN NEEDLE NANO U/F) 32G X 4 MM MISC Inject 1 each into the skin in the morning and at bedtime. (Patient not taking: Reported on 04/23/2020)   . levothyroxine (SYNTHROID) 75 MCG tablet Take 1 tablet (75 mcg total) by mouth daily.   Marland Kitchen loratadine (CLARITIN) 10 MG tablet Take 10 mg by mouth daily. Taking 3-4 days each week due to bone pain associated with shot from cancer center.   Marland Kitchen LORazepam (ATIVAN) 0.5 MG tablet TAKE ONE TABLET BY MOUTH TWICE DAILY AS NEEDED FOR ANXIETY   . losartan (COZAAR)  50 MG tablet Take 1 tablet (50 mg total) by mouth every evening.   . Magnesium 500 MG CAPS 500 mg.   . morphine (MS CONTIN) 30 MG 12 hr tablet Take 1 tablet (30 mg total) by mouth every 12 (twelve) hours.   . Multiple Vitamin (MULTIVITAMIN WITH MINERALS) TABS tablet Take 1 tablet by mouth daily.  06/08/2018: Pt plans on purchasing again when she can get out.  . ondansetron (ZOFRAN) 4 MG tablet Take 1 tablet (4 mg total) by mouth every 4 (four) hours as needed for  nausea.   Glory Rosebush Delica Lancets 80D MISC 1 each by Does not apply route daily before breakfast. Check blood sugar twice daily.   . pantoprazole (PROTONIX) 40 MG tablet Take 1 tablet (40 mg total) by mouth 2 (two) times daily.   . Pegfilgrastim-jmdb (FULPHILA [place]) Inject into the skin.   . potassium chloride (KLOR-CON) 20 MEQ packet 20 mEq in the morning and at bedtime.   . pravastatin (PRAVACHOL) 20 MG tablet Take 20 mg by mouth at bedtime.   . prochlorperazine (COMPAZINE) 10 MG tablet Take 1 tablet (10 mg total) by mouth every 6 (six) hours as needed for nausea or vomiting.   . prochlorperazine (COMPAZINE) 10 MG tablet Take 1 tablet (10 mg total) by mouth every 6 (six) hours as needed for nausea or vomiting.   Marland Kitchen rOPINIRole (REQUIP) 0.25 MG tablet Take 1 tablet (0.25 mg total) by mouth at bedtime.   . senna (SENOKOT) 8.6 MG tablet Take 1 tablet by mouth daily as needed for constipation.   . SODIUM FLUORIDE 5000 SENSITIVE 1.1-5 % GEL Take by mouth 2 (two) times daily.   Marland Kitchen Specialty Vitamins Products (MAGNESIUM, AMINO ACID CHELATE,) 133 MG tablet Take 1 tablet by mouth at bedtime.   . sucralfate (CARAFATE) 1 g tablet Take 1 tablet (1 g total) by mouth 4 (four) times daily -  with meals and at bedtime.   . Vitamin D, Ergocalciferol, (DRISDOL) 1.25 MG (50000 UNIT) CAPS capsule TAKE ONE CAPSULE BY MOUTH ONCE WEEKLY ON FRIDAY   . zolpidem (AMBIEN) 10 MG tablet TAKE ONE TABLET BY MOUTH EVERYDAY AT BEDTIME AS NEEDED    No facility-administered encounter medications on file as of 05/29/2020.   Reviewed chart for medication changes ahead of medication coordination call.  No OVs, Consults, or hospital visits since last care coordination call/Pharmacist visit. (If appropriate, list visit date, provider name)  No medication changes indicated OR if recent visit, treatment plan here.  BP Readings from Last 3 Encounters:  05/20/20 131/65  04/23/20 138/60  04/16/20 130/80    Lab Results  Component  Value Date   HGBA1C 7.4 (H) 04/23/2020     Patient obtains medications through Vials  30 Days   Last adherence delivery included:  Losartan Potassium 50 mg-  Fetzima-80 mg-  Bupropion Hcl Xl 300 mg- Potassium chloride ER 10 meq Morphine Sulfate 30 mg  Zolpidem 10 mg Famotidine 20 mg Pravastatin 20 mg-  Lantus Solostar U 100- inject 9.83JA Trulicity-inject 1 syringe once a week Pantoprazole 40 mg Vitamin D 50,000 units  Levothyroxine 75 mcg  Patient declined (meds) last month due to PRN use/additional supply on hand. Cyclobenzaprine-  Has on hand Sucralfate has on hand  Patient is due for next adherence delivery on: 06/04/20 Called patient and reviewed medications and coordinated delivery.  This delivery to include: Losartan Potassium 50 mg-  Fetzima-80 mg-  Bupropion Hcl Xl 300 mg- Potassium chloride ER 10 meq  Morphine Sulfate 30 mg  Zolpidem 10 mg Famotidine 20 mg Pravastatin 20 mg-  Lantus Solostar U 100- inject 8.89VQ Trulicity-inject 1 syringe once a week Pantoprazole 40 mg Vitamin D 50,000 units  Levothyroxine 75 mcg Synjardy Ropinerole    Patient declined the following medications  Gabapentin- has enough on hand   Patient needs refills for: Per [facility]  - Synjardy  - Fetzima - Pravastatin - Potassium   - VD - Morphine   -          gabapentin   Confirmed delivery date of 06/04/20, advised patient that pharmacy will contact them the morning of Delivery.  Clarita Leber, [place] Pharmacist Assistant [phone]

## 2020-05-29 NOTE — Progress Notes (Signed)
Chronic Care Management Pharmacy Note  06/02/2020 Name:  Linda [name] MRN:  [id number] DOB:  11-Jan-1971   Plan recommendatons:   Patient's foot is healing well.   She is following up with oncologist every 3 months but not actively undergoing treatment.   Patient is experiencing low blood sugar more regularly. While on the phone her blood sugar dropped to 68 mg/dL after eating within the past few hours. Pharmacist consulted with Dr. Tobie Poet and advised patient to increase Synjardy dose to twice daily as previously recommended and reduce Lantus dose to 30 units daily. Will continue to work on increasing Trulicity once stable on [place] with hopes of discontinuing insulin.   Subjective: Linda [name] is an 50 y.o. year old female who is a primary patient of [name], Kirsten, MD.  The CCM team was consulted for assistance with disease management and care coordination needs.    Engaged with patient by telephone for follow up visit in response to provider referral for pharmacy case management and/or care coordination services.   Consent to Services:  The patient was given information about Chronic Care Management services, agreed to services, and gave verbal consent prior to initiation of services.  Please see initial visit note for detailed documentation.   Patient Care Team: [name], MD as PCP - General (Family Medicine) Burnice Logan, [facility] as Pharmacist (Pharmacist)  Recent office visits: 02/11/2020 - continue synjardy 12.05/998 mg daily. 6 units of Novolog before meals if sugar >150.  10/09/2019 - flu shot given. Consider reduction in diabetes medication in the future if A1C stays excellent.   Recent Consult Visit:  05/26/20-podiatry, Diabetic ulcer of toe of left foot associated with diabetes mellitus due to underlying condition, limited to breakdown of skin-  05/20/20-Oncology-Labs, prochlorperazine (COMPAZINE) 10 MG tablet,  She will proceed with antibiotic therapy per  podiatry. I will order the abdominal ultrasound and we will plan to discuss these results over the phone. She will see Dr. Melina Copa in a couple of months. I will obtain iron studies and forward to Dr. Melina Copa for that appointment. I will refill her compazine today. We will see her back in 3 months for repeat CBC, CMP and clinical evaluation.   05/01/20-podiatry-wound check, Continue with PICC line antibiotics long-term for history of osteomyelitis left second toehas approximately 2 more weeks left, Continue with compression garments to assist with edema control -Patient to return to officein 2-3weeks for follow up care and evaluationor sooner if problems arise. 02/24/2020 - oncology visit. Continuing paclitaxel.  02/21/2020 - Podiatry - debridement of second toe ulcer performed.   Hospital visits: None in previous 6 months  Objective:  Lab Results  Component Value Date   CREATININE 0.8 05/20/2020   BUN 14 05/20/2020   GFRNONAA >60 11/27/2019   GFRAA 73 10/09/2019   NA 139 05/20/2020   K 4.2 05/20/2020   CALCIUM 9.7 05/20/2020   CO2 28 (A) 05/20/2020    Lab Results  Component Value Date/Time   HGBA1C 7.4 (H) 04/23/2020 02:04 PM   HGBA1C 6.9 (H) 10/09/2019 09:47 AM   MICROALBUR 30 10/09/2019 11:56 AM   MICROALBUR 80 07/08/2019 09:51 AM    Last diabetic Eye exam:  Lab Results  Component Value Date/Time   HMDIABEYEEXA No Retinopathy 04/30/2020 12:00 AM    Last diabetic Foot exam: No results found for: HMDIABFOOTEX   Lab Results  Component Value Date   CHOL 181 10/09/2019   HDL 43 10/09/2019   LDLCALC 112 (H) 10/09/2019  TRIG 146 10/09/2019   CHOLHDL 4.2 10/09/2019    Hepatic Function Latest Ref Rng & Units 05/20/2020 04/23/2020 04/16/2020  Total Protein 6.0 - 8.5 g/dL - 6.8 6.1(A)  Albumin 3.5 - 5.0 4.5 4.2 3.5  AST 13 - 35 31 24 30   ALT 7 - 35 18 14 21   Alk Phosphatase 25 - 125 74 94 89  Total Bilirubin 0.0 - 1.2 mg/dL - 0.5 -    Lab Results  Component Value  Date/Time   TSH 3.920 04/23/2020 02:04 PM   TSH 1.450 07/08/2019 12:00 AM    CBC Latest Ref Rng & Units 05/20/2020 04/23/2020 04/16/2020  WBC - 5.2 5.5 5.0  Hemoglobin 12.0 - 16.0 12.5 11.3 10.7(A)  Hematocrit 36 - 46 39 35.5 33(A)  Platelets 150 - 399 89(A) 128(L) 92(A)    Lab Results  Component Value Date/Time   VD25OH 53.8 04/23/2020 02:04 PM    Clinical ASCVD: No  The 10-year ASCVD risk score Mikey Bussing DC Jr., et al., 2013) is: 4.3%   Values used to calculate the score:     Age: 50 years     Sex: Female     Is Non-Hispanic African American: No     Diabetic: Yes     Tobacco smoker: No     Systolic Blood Pressure: 160 mmHg     Is BP treated: Yes     HDL Cholesterol: 43 mg/dL     Total Cholesterol: 181 mg/dL    Depression screen [facility] 2/9 03/09/2020 10/09/2019 06/26/2019  Decreased Interest 3 3 1   Down, Depressed, Hopeless 3 3 1   PHQ - 2 Score 6 6 2   Altered sleeping 3 3 3   Tired, decreased energy 3 3 2   Change in appetite 3 3 1   Feeling bad or failure about yourself  2 0 0  Trouble concentrating 2 3 3   Moving slowly or fidgety/restless 2 3 0  Suicidal thoughts 0 0 0  PHQ-9 Score 21 21 11   Difficult doing work/chores - Somewhat difficult -     Social History   Tobacco Use  Smoking Status Never Smoker  Smokeless Tobacco Never Used   BP Readings from Last 3 Encounters:  05/20/20 131/65  04/23/20 138/60  04/16/20 130/80   Pulse Readings from Last 3 Encounters:  05/20/20 92  04/23/20 80  04/16/20 (!) 105   Wt Readings from Last 3 Encounters:  05/20/20 182 lb 11.2 oz (82.9 kg)  04/23/20 184 lb (83.5 kg)  04/16/20 212 lb 11.2 oz (96.5 kg)    Assessment/Interventions: Review of patient past medical history, allergies, medications, health status, including review of consultants reports, laboratory and other test data, was performed as part of comprehensive evaluation and provision of chronic care management services.   SDOH:  (Social Determinants of Health)  assessments and interventions performed: Yes   CCM Care Plan  Allergies  Allergen Reactions  . Codeine Shortness Of Breath    Other reaction(s): SHOB  . Celecoxib Other (See Comments)    Unknown reaction Other reaction(s): Unknown  . Ezetimibe     Other reaction(s): Unknown  . Ezetimibe-Simvastatin Other (See Comments)    Unknown reaction  . Propranolol     Other reaction(s): Unknown  . Propranolol Hcl Other (See Comments)    Unknown reaction  . Simvastatin     Other reaction(s): Unknown    Medications Reviewed Today    Reviewed by Burnice Logan, [facility] (Pharmacist) on 06/02/20 at 2112  Med List Status: <None>  Medication Order Taking? Sig Documenting Provider Last Dose Status Informant  Blood Glucose Monitoring Suppl (ONETOUCH VERIO REFLECT) w/Device KIT [id number] Yes AS DIRECTED [provider] Taking Active   buPROPion (WELLBUTRIN XL) 300 MG 24 hr tablet [id number] Yes Take 1 tablet (300 mg total) by mouth every evening. [name], Kirsten, MD Taking Active   calcium citrate-vitamin D (CITRACAL+D) 315-200 MG-UNIT tablet [id number] Yes Take 1 tablet by mouth 2 (two) times daily. [provider] Taking Active   clotrimazole-betamethasone (LOTRISONE) cream [id number] Yes Apply small amount to affected area twice daily  Patient taking differently: as needed. Apply small amount to affected area twice daily   [name], Kirsten, MD Taking Active   Continuous Blood Gluc Receiver (FREESTYLE LIBRE 2 READER) [place] [id number] Yes E11.69 Check blood sugar 4 times daily as directed [name], Kirsten, MD Taking Active   Continuous Blood Gluc Sensor (FREESTYLE LIBRE 2 SENSOR) [place] [id number] Yes E11.69 Change sensor every 14 days as directed [name], Elnita Maxwell, MD Taking Active   cyclobenzaprine (FLEXERIL) 10 MG tablet [id number] Yes TAKE ONE TABLET BY MOUTH EVERY 8 HOURS AS NEEDED FOR MUSCLE SPASMS [place] Scrape A, NP Taking Active   dicyclomine (BENTYL) 20 MG tablet [id number] Yes TAKE ONE TABLET BY  MOUTH BEFORE MEALS AND AT BEDTIME AS NEEDED FOR STOMACH CRAMPING [name], Elnita Maxwell, MD Taking Active   Dulaglutide (TRULICITY) 1.63 AG/5.3MI [place] [id number] Yes INJECT ONE SYRINGE ONCE A WEEK [name], Kirsten, MD Taking Active   Empagliflozin-metFORMIN HCl [facility]) 12.05-998 MG TABS [id number] Yes Take 1 tablet by mouth 2 (two) times daily. [name], Kirsten, MD Taking Active   famotidine (PEPCID) 20 MG tablet [id number] Yes Take 1 tablet (20 mg total) by mouth 2 (two) times daily. [name], Kirsten, MD Taking Active   ferrous sulfate 325 (65 FE) MG tablet [id number] Yes Take 325 mg by mouth every evening. [provider] Taking Active   gabapentin (NEURONTIN) 300 MG capsule [id number] Yes TAKE ONE CAPSULE BY MOUTH THREE TIMES DAILY [name], Kirsten, MD Taking Active   insulin glargine (LANTUS SOLOSTAR) 100 UNIT/ML Solostar Pen [id number] Yes INJECT 68 UNITS SUBCUTANEOUSLY EVERYDAY AT BEDTIME [name], Kirsten, MD Taking Active            Med Note [place], [place] Jun 02, 2020  9:11 PM) 30 units daily approved by Dr. Tobie Poet  Insulin Pen Needle (BD PEN NEEDLE NANO U/F) 32G X 4 MM MISC [id number] Yes Inject 1 each into the skin in the morning and at bedtime. [name], Kirsten, MD Taking Active   Levomilnacipran HCl ER [facility]) 80 MG CP24 [id number] Yes Take 1 capsule by mouth daily. [name], Kirsten, MD Taking Active   levothyroxine (SYNTHROID) 75 MCG tablet [id number] Yes Take 1 tablet (75 mcg total) by mouth daily. [name], Kirsten, MD Taking Active   loratadine (CLARITIN) 10 MG tablet [id number] Yes Take 10 mg by mouth daily. Taking 3-4 days each week due to bone pain associated with shot from cancer center. [provider] Taking Active   LORazepam (ATIVAN) 0.5 MG tablet [id number] Yes TAKE ONE TABLET BY MOUTH TWICE DAILY AS NEEDED FOR ANXIETY [name], Kirsten, MD Taking Active   losartan (COZAAR) 50 MG tablet [id number] Yes Take 1 tablet (50 mg total) by mouth every evening. [name], MD Taking Active   Magnesium 500 MG CAPS  [id number] Yes 500 mg. [provider] Taking Active   morphine (MS CONTIN) 30 MG 12 hr tablet [id number] Yes Take 1 tablet (30 mg total) by mouth every 12 (twelve)  hours. [name], Kirsten, MD Taking Active   Multiple Vitamin (MULTIVITAMIN WITH MINERALS) TABS tablet [id number] Yes Take 1 tablet by mouth daily.  [provider] Taking Active Self           Med Note Orvan Seen, [place] D[place]   Fri Jun 08, 2018  9:21 PM) Pt plans on purchasing again when she can get out.  ondansetron (ZOFRAN) 4 MG tablet [id number] Yes Take 1 tablet (4 mg total) by mouth every 4 (four) hours as needed for nausea. Melodye Ped, NP Taking Active   OneTouch Delica Lancets 33I MISC [id number] Yes 1 each by Does not apply route daily before breakfast. Check blood sugar twice daily. [name], Kirsten, MD Taking Active   pantoprazole (PROTONIX) 40 MG tablet [id number] Yes Take 1 tablet (40 mg total) by mouth 2 (two) times daily. [name], MD Taking Active   Pegfilgrastim-jmdb [facility]) [id number]  Inject into the skin. [provider]  Active   potassium chloride (KLOR-CON) 20 MEQ packet [id number] No 20 mEq in the morning and at bedtime.  Patient not taking: Reported on 06/02/2020   [provider] Not Taking Active   Potassium Chloride ER 20 MEQ TBCR [id number] Yes Take 1 tablet by mouth daily. [name], Kirsten, MD Taking Active   pravastatin (PRAVACHOL) 20 MG tablet [id number] Yes Take 1 tablet (20 mg total) by mouth at bedtime. [name], Kirsten, MD Taking Active   prochlorperazine (COMPAZINE) 10 MG tablet [id number] Yes Take 1 tablet (10 mg total) by mouth every 6 (six) hours as needed for nausea or vomiting. Melodye Ped, NP Taking Active   prochlorperazine (COMPAZINE) 10 MG tablet [id number] Yes Take 1 tablet (10 mg total) by mouth every 6 (six) hours as needed for nausea or vomiting. [place] Scrape A, NP Taking Active   rOPINIRole (REQUIP) 0.25 MG tablet [id number] Yes Take 1 tablet (0.25 mg total)  by mouth at bedtime. [name], Kirsten, MD Taking Active   senna (SENOKOT) 8.6 MG tablet [id number] Yes Take 1 tablet by mouth daily as needed for constipation. [provider] Taking Active   SODIUM FLUORIDE 5000 SENSITIVE 1.1-5 % GEL [id number] Yes Take by mouth 2 (two) times daily. [provider] Taking Active   Specialty Vitamins Products (MAGNESIUM, AMINO ACID CHELATE,) 133 MG tablet [id number] Yes Take 1 tablet by mouth at bedtime. [provider] Taking Active   sucralfate (CARAFATE) 1 g tablet [id number] Yes Take 1 tablet (1 g total) by mouth 4 (four) times daily -  with meals and at bedtime. [name], Kirsten, MD Taking Active   Vitamin D, Ergocalciferol, (DRISDOL) 1.25 MG (50000 UNIT) CAPS capsule [id number] Yes TAKE ONE CAPSULE BY MOUTH ONCE WEEKLY ON Peggyann Shoals, MD Taking Active   zolpidem (AMBIEN) 10 MG tablet [id number] Yes TAKE ONE TABLET BY MOUTH EVERYDAY AT BEDTIME AS NEEDED CoxElnita Maxwell, MD Taking Active           Patient Active Problem List   Diagnosis Date Noted  . Acute postoperative respiratory insufficiency 04/17/2020  . AKI (acute kidney injury) ([place]) 04/17/2020  . 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 Osteomyelitis ([place]) 04/17/2020  . 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 Dehydration 03/16/2020  . Hypokalemia 03/09/2020  . Chemotherapy-induced thrombocytopenia 02/06/2020  . Thrombocytopenia, unspecified ([place]) 01/27/2020  . Malignant neoplasm of lower-inner quadrant of right breast of female, estrogen receptor negative ([place]) 10/01/2019  . Chronic pain  syndrome 07/08/2019  . Memory loss 07/08/2019  . Depression, major, recurrent, mild ([place]) 07/08/2019  . Uncomplicated opioid dependence ([place]) 07/08/2019  . Mixed hyperlipidemia 04/11/2019  . Dyslipidemia associated with type 2 diabetes mellitus ([place]) 04/11/2019  . Essential hypertension, benign  04/11/2019  . Major depressive disorder, single episode, mild ([place]) 04/11/2019  . Acquired hypothyroidism 04/11/2019  . Vitamin D insufficiency 04/11/2019  . Class 2 severe obesity due to excess calories with serious comorbidity and body mass index (BMI) of 35.0 to 35.9 in adult ([place]) 04/11/2019  . Pre-ulcerative calluses 03/04/2019  . Liver cirrhosis secondary to NASH (nonalcoholic steatohepatitis) ([place]) 09/13/2018    Class: Chronic  . BRCA1 positive 06/14/2016    Immunization History  Administered Date(s) Administered  . Hepatitis B 07/10/2019  . Hepatitis B, adult 03/11/2020  . Influenza Inj Mdck Quad Pf 10/09/2019  . Influenza-Unspecified 09/14/2018  . PFIZER(Purple Top)SARS-COV-2 Vaccination 04/18/2019, 05/13/2019, 01/24/2020  . Pneumococcal Polysaccharide-23 04/27/2012    Conditions to be addressed/monitored:  Hypertension, Hyperlipidemia, Diabetes, Hypothyroidism and Depression  Care Plan : [place]  Updates made by Burnice Logan, [place] since 06/02/2020 12:00 AM    Problem: diabetes, hyperlipidemia, hypertension   Priority: High  Onset Date: 03/05/2020    Long-Range Goal: Disease Management   Start Date: 03/05/2020  Expected End Date: 03/05/2021  This Visit's Progress: On track  Recent Progress: On track  Priority: High  Note:     Current Barriers:  . Unable to maintain control of blood sugar while on steroids/chemotherapy.    Pharmacist Clinical Goal(s):  Marland Kitchen Over the next 90 days, patient will achieve control of diabetes as evidenced by blood sugar and a1c through collaboration with PharmD and provider.   Interventions: . 1:1 collaboration with [name], MD regarding development and update of comprehensive plan of care as evidenced by provider attestation and co-signature . Inter-disciplinary care team collaboration (see longitudinal plan of care) . Comprehensive medication review performed; medication list updated in electronic medical  record  Hypertension (BP goal <130/80) -controlled -Current treatment: . losartan 50 mg daily  -Medications previously tried: none reported  -Current home readings: well controlled when checked at home or office -Current dietary habits: eating what family prepares while on chemotherapy -Current exercise habits: chemotherapy limits exercise and energy -Denies hypotensive/hypertensive symptoms -Educated on BP goals and benefits of medications for prevention of heart attack, stroke and kidney damage; Daily salt intake goal < 2300 mg; -Counseled to monitor BP at home as needed, document, and provide log at future appointments -Counseled on diet and exercise extensively Recommended to continue current medication  Hyperlipidemia: (LDL goal < 70) -uncontrolled  -Current treatment: . Pravastatin 40 mg daily  -Medications previously tried: none reported  -Current dietary patterns: eating what family provides during chemotherapy -Current exercise habits: limited due to chemo -Educated on Cholesterol goals;  Importance of limiting foods high in cholesterol; Exercise goal of 150 minutes per week; -Counseled on diet and exercise extensively  Diabetes (A1c goal <7%) -uncontrolled -Current medications: . Trulicity 3.38 mg weekly  . Synjardy 12.05/998 mg twice daily  . Lantus 30 units daily   -Medications previously tried: none reported  -Current home glucose readings . fasting glucose: 98, 80s, 90s, occasional low reading libre . post prandial glucose: 110, 114 -Reports hypoglycemic symptoms -Current meal patterns:  . Diet: Patient currently eating what family provides. She has lost weight during chemotherapy.  -Current exercise: limited due to chemotherapy -Educated onA1c and blood sugar goals; Exercise goal of  150 minutes per week; Benefits of routine self-monitoring of blood sugar; -Counseled to check feet daily and get yearly eye exams -Counseled on diet and exercise extensively   -Recommend increasing Synjardy to twice daily per previous recommendation. Reduce lantus dose to 30 units daily.   Depression/Anxiety (Goal: manage symptoms) -uncontrolled -Current treatment: . Wellbutrin XL 300 mg daily . Fetzima 80 mg  . Lorazepam 0.5 mg bid prn anxiety -Medications previously tried/failed: none reported -PHQ9: 21 -GAD7: 16 -Educated on Benefits of cognitive-behavioral therapy with or without medication -Recommended to continue current medication Recommended consider speaking with a counselor.    Patient Goals/Self-Care Activities . Over the next 90 days, patient will:  - take medications as prescribed check glucose three times daily , document, and provide at future appointments  Follow Up Plan: Telephone follow up appointment with care management team member scheduled for: 08/2020      Medication Assistance: None required.  Patient affirms current coverage meets needs.  Patient's preferred pharmacy is:  [facility] - [place], [place] - [address] [place] [address] Phone: [phone] Fax: [phone]  [place] (New Address) - [place], [place] AT Previously: [address] [place] [address] Phone: [phone] Fax: [phone]  Uses pill box? Yes Pt endorses good compliance  We discussed: Benefits of medication synchronization, packaging and delivery as well as enhanced pharmacist oversight with [facility]. Patient decided to: Utilize [facility] pharmacy for medication synchronization, packaging and delivery  Care Plan and Follow Up Patient Decision:  Patient agrees to Care Plan and Follow-up.  Plan: Telephone follow up appointment with care management team member scheduled for:  08/2020

## 2020-06-01 ENCOUNTER — Other Ambulatory Visit: Payer: Self-pay | Admitting: Family Medicine

## 2020-06-01 ENCOUNTER — Other Ambulatory Visit: Payer: Self-pay

## 2020-06-01 DIAGNOSIS — Z1509 Genetic susceptibility to other malignant neoplasm: Secondary | ICD-10-CM

## 2020-06-01 DIAGNOSIS — Z1501 Genetic susceptibility to malignant neoplasm of breast: Secondary | ICD-10-CM

## 2020-06-01 DIAGNOSIS — C50311 Malignant neoplasm of lower-inner quadrant of right female breast: Secondary | ICD-10-CM | POA: Diagnosis not present

## 2020-06-01 DIAGNOSIS — K7581 Nonalcoholic steatohepatitis (NASH): Secondary | ICD-10-CM | POA: Diagnosis not present

## 2020-06-01 DIAGNOSIS — Z171 Estrogen receptor negative status [ER-]: Secondary | ICD-10-CM | POA: Diagnosis not present

## 2020-06-01 DIAGNOSIS — K7460 Unspecified cirrhosis of liver: Secondary | ICD-10-CM | POA: Diagnosis not present

## 2020-06-01 MED ORDER — POTASSIUM CHLORIDE ER 20 MEQ PO TBCR: 1.0000 | EXTENDED_RELEASE_TABLET | Freq: Every day | ORAL | 1 refills | Status: DC

## 2020-06-01 MED ORDER — PRAVASTATIN SODIUM 20 MG PO TABS: 20.0000 mg | ORAL_TABLET | Freq: Every day | ORAL | 1 refills | Status: DC

## 2020-06-01 MED ORDER — MORPHINE SULFATE ER 30 MG PO TBCR: 30.0000 mg | EXTENDED_RELEASE_TABLET | Freq: Two times a day (BID) | ORAL | 0 refills | Status: DC

## 2020-06-01 MED ORDER — FETZIMA 80 MG PO CP24: 1.0000 | ORAL_CAPSULE | Freq: Every day | ORAL | 1 refills | Status: DC

## 2020-06-01 MED ORDER — VITAMIN D (ERGOCALCIFEROL) 1.25 MG (50000 UNIT) PO CAPS
ORAL_CAPSULE | ORAL | 1 refills | Status: DC
Start: 2020-06-01 — End: 2020-11-18

## 2020-06-01 MED ORDER — SYNJARDY 12.5-1000 MG PO TABS: 1.0000 | ORAL_TABLET | Freq: Two times a day (BID) | ORAL | 0 refills | Status: DC

## 2020-06-02 ENCOUNTER — Other Ambulatory Visit: Payer: Self-pay

## 2020-06-02 ENCOUNTER — Ambulatory Visit (INDEPENDENT_AMBULATORY_CARE_PROVIDER_SITE_OTHER): Payer: PPO

## 2020-06-02 DIAGNOSIS — N1832 Chronic kidney disease, stage 3b: Secondary | ICD-10-CM | POA: Diagnosis not present

## 2020-06-02 DIAGNOSIS — E1121 Type 2 diabetes mellitus with diabetic nephropathy: Secondary | ICD-10-CM

## 2020-06-02 DIAGNOSIS — I129 Hypertensive chronic kidney disease with stage 1 through stage 4 chronic kidney disease, or unspecified chronic kidney disease: Secondary | ICD-10-CM | POA: Diagnosis not present

## 2020-06-02 NOTE — Patient Instructions (Addendum)
Visit Information  Goals Addressed   None    Patient Care Plan: CCM Pharmacy Care Plan    Problem Identified: diabetes, hyperlipidemia, hypertension   Priority: High  Onset Date: 03/05/2020    Long-Range Goal: Disease Management   Start Date: 03/05/2020  Expected End Date: 03/05/2021  This Visit's Progress: On track  Recent Progress: On track  Priority: High  Note:     Current Barriers:  . Unable to maintain control of blood sugar while on steroids/chemotherapy.    Pharmacist Clinical Goal(s):  Marland Kitchen Over the next 90 days, patient will achieve control of diabetes as evidenced by blood sugar and a1c through collaboration with PharmD and provider.   Interventions: . 1:1 collaboration with [name], MD regarding development and update of comprehensive plan of care as evidenced by provider attestation and co-signature . Inter-disciplinary care team collaboration (see longitudinal plan of care) . Comprehensive medication review performed; medication list updated in electronic medical record  Hypertension (BP goal <130/80) -controlled -Current treatment: . losartan 50 mg daily  -Medications previously tried: none reported  -Current home readings: well controlled when checked at home or office -Current dietary habits: eating what family prepares while on chemotherapy -Current exercise habits: chemotherapy limits exercise and energy -Denies hypotensive/hypertensive symptoms -Educated on BP goals and benefits of medications for prevention of heart attack, stroke and kidney damage; Daily salt intake goal < 2300 mg; -Counseled to monitor BP at home as needed, document, and provide log at future appointments -Counseled on diet and exercise extensively Recommended to continue current medication  Hyperlipidemia: (LDL goal < 70) -uncontrolled  -Current treatment: . Pravastatin 40 mg daily  -Medications previously tried: none reported  -Current dietary patterns: eating what family  provides during chemotherapy -Current exercise habits: limited due to chemo -Educated on Cholesterol goals;  Importance of limiting foods high in cholesterol; Exercise goal of 150 minutes per week; -Counseled on diet and exercise extensively  Diabetes (A1c goal <7%) -uncontrolled -Current medications: . Trulicity 9.38 mg weekly  . Synjardy 12.05/998 mg twice daily  . Lantus 30 units daily   -Medications previously tried: none reported  -Current home glucose readings . fasting glucose: 98, 80s, 90s, occasional low reading libre . post prandial glucose: 110, 114 -Reports hypoglycemic symptoms -Current meal patterns:  . Diet: Patient currently eating what family provides. She has lost weight during chemotherapy.  -Current exercise: limited due to chemotherapy -Educated onA1c and blood sugar goals; Exercise goal of 150 minutes per week; Benefits of routine self-monitoring of blood sugar; -Counseled to check feet daily and get yearly eye exams -Counseled on diet and exercise extensively  -Recommend increasing Synjardy to twice daily per previous recommendation. Reduce lantus dose to 30 units daily.   Depression/Anxiety (Goal: manage symptoms) -uncontrolled -Current treatment: . Wellbutrin XL 300 mg daily . Fetzima 80 mg  . Lorazepam 0.5 mg bid prn anxiety -Medications previously tried/failed: none reported -PHQ9: 21 -GAD7: 16 -Educated on Benefits of cognitive-behavioral therapy with or without medication -Recommended to continue current medication Recommended consider speaking with a counselor.    Patient Goals/Self-Care Activities . Over the next 90 days, patient will:  - take medications as prescribed check glucose three times daily , document, and provide at future appointments  Follow Up Plan: Telephone follow up appointment with care management team member scheduled for: 08/2020      Patient verbalizes understanding of instructions provided today and agrees to  view in [place].  Telephone follow up appointment with pharmacy team member scheduled  for: 08/2020  Burnice Logan, [facility]  [email or web address]">  DASH Eating Plan DASH stands for Dietary Approaches to Stop Hypertension. The DASH eating plan is a healthy eating plan that has been shown to:  Reduce high blood pressure (hypertension).  Reduce your risk for type 2 diabetes, heart disease, and stroke.  Help with weight loss. What are tips for following this plan? Reading food labels  Check food labels for the amount of salt (sodium) per serving. Choose foods with less than 5 percent of the Daily Value of sodium. Generally, foods with less than 300 milligrams (mg) of sodium per serving fit into this eating plan.  To find whole grains, look for the word "whole" as the first word in the ingredient list. Shopping  Buy products labeled as "low-sodium" or "no salt added."  Buy fresh foods. Avoid canned foods and pre-made or frozen meals. Cooking  Avoid adding salt when cooking. Use salt-free seasonings or herbs instead of table salt or sea salt. Check with your health care provider or pharmacist before using salt substitutes.  Do not fry foods. Cook foods using healthy methods such as baking, boiling, grilling, roasting, and broiling instead.  Cook with heart-healthy oils, such as olive, canola, avocado, soybean, or sunflower oil. Meal planning  Eat a balanced diet that includes: ? 4 or more servings of fruits and 4 or more servings of vegetables each day. Try to fill one-half of your plate with fruits and vegetables. ? 6-8 servings of whole grains each day. ? Less than 6 oz (170 g) of lean meat, poultry, or fish each day. A 3-oz (85-g) serving of meat is about the same size as a deck of cards. One egg equals 1 oz (28 g). ? 2-3 servings of low-fat dairy each day. One serving is 1 cup (237 mL). ? 1 serving of nuts, seeds, or beans 5 times each  week. ? 2-3 servings of heart-healthy fats. Healthy fats called omega-3 fatty acids are found in foods such as walnuts, flaxseeds, fortified milks, and eggs. These fats are also found in cold-water fish, such as sardines, salmon, and mackerel.  Limit how much you eat of: ? Canned or prepackaged foods. ? Food that is high in trans fat, such as some fried foods. ? Food that is high in saturated fat, such as fatty meat. ? Desserts and other sweets, sugary drinks, and other foods with added sugar. ? Full-fat dairy products.  Do not salt foods before eating.  Do not eat more than 4 egg yolks a week.  Try to eat at least 2 vegetarian meals a week.  Eat more home-cooked food and less restaurant, buffet, and fast food.   Lifestyle  When eating at a restaurant, ask that your food be prepared with less salt or no salt, if possible.  If you drink alcohol: ? Limit how much you use to:  0-1 drink a day for women who are not pregnant.  0-2 drinks a day for men. ? Be aware of how much alcohol is in your drink. In the [place] one drink equals one 12 oz bottle of beer (355 mL), one 5 oz glass of wine (148 mL), or one 1 oz glass of hard liquor (44 mL). General information  Avoid eating more than 2,300 mg of salt a day. If you have hypertension, you may need to reduce your sodium intake to 1,500 mg a day.  Work with your health care provider to maintain a healthy body weight or to lose  weight. Ask what an ideal weight is for you.  Get at least 30 minutes of exercise that causes your heart to beat faster (aerobic exercise) most days of the week. Activities may include walking, swimming, or biking.  Work with your health care provider or dietitian to adjust your eating plan to your individual calorie needs. What foods should I eat? Fruits All fresh, dried, or frozen fruit. Canned fruit in natural juice (without added sugar). Vegetables Fresh or frozen vegetables (raw, steamed, roasted, or  grilled). Low-sodium or reduced-sodium tomato and vegetable juice. Low-sodium or reduced-sodium tomato sauce and tomato paste. Low-sodium or reduced-sodium canned vegetables. Grains Whole-grain or whole-wheat bread. Whole-grain or whole-wheat pasta. Juergen Hardenbrook rice. [place] Morrow. Bulgur. Whole-grain and low-sodium cereals. Pita bread. Low-fat, low-sodium crackers. Whole-wheat flour tortillas. Meats and other proteins Skinless chicken or [place]. Ground chicken or [place]. Pork with fat trimmed off. Fish and seafood. Egg whites. Dried beans, peas, or lentils. Unsalted nuts, nut butters, and seeds. Unsalted canned beans. Lean cuts of beef with fat trimmed off. Low-sodium, lean precooked or cured meat, such as sausages or meat loaves. Dairy Low-fat (1%) or fat-free (skim) milk. Reduced-fat, low-fat, or fat-free cheeses. Nonfat, low-sodium ricotta or cottage cheese. Low-fat or nonfat yogurt. Low-fat, low-sodium cheese. Fats and oils Soft margarine without trans fats. Vegetable oil. Reduced-fat, low-fat, or light mayonnaise and salad dressings (reduced-sodium). Canola, safflower, olive, avocado, soybean, and sunflower oils. Avocado. Seasonings and condiments Herbs. Spices. Seasoning mixes without salt. Other foods Unsalted popcorn and pretzels. Fat-free sweets. The items listed above may not be a complete list of foods and beverages you can eat. Contact a dietitian for more information. What foods should I avoid? Fruits Canned fruit in a light or heavy syrup. Fried fruit. Fruit in cream or butter sauce. Vegetables Creamed or fried vegetables. Vegetables in a cheese sauce. Regular canned vegetables (not low-sodium or reduced-sodium). Regular canned tomato sauce and paste (not low-sodium or reduced-sodium). Regular tomato and vegetable juice (not low-sodium or reduced-sodium). Angie Fava. Olives. Grains Baked goods made with fat, such as croissants, muffins, or some breads. Dry pasta or rice meal packs. Meats  and other proteins Fatty cuts of meat. Ribs. Fried meat. Berniece Salines. Bologna, salami, and other precooked or cured meats, such as sausages or meat loaves. Fat from the back of a pig (fatback). Bratwurst. Salted nuts and seeds. Canned beans with added salt. Canned or smoked fish. Whole eggs or egg yolks. Chicken or [place] with skin. Dairy Whole or 2% milk, cream, and half-and-half. Whole or full-fat cream cheese. Whole-fat or sweetened yogurt. Full-fat cheese. Nondairy creamers. Whipped toppings. Processed cheese and cheese spreads. Fats and oils Butter. Stick margarine. Lard. Shortening. Ghee. Bacon fat. Tropical oils, such as coconut, palm kernel, or palm oil. Seasonings and condiments Onion salt, garlic salt, seasoned salt, table salt, and sea salt. Worcestershire sauce. Tartar sauce. Barbecue sauce. Teriyaki sauce. Soy sauce, including reduced-sodium. Steak sauce. Canned and packaged gravies. Fish sauce. Oyster sauce. Cocktail sauce. Store-bought horseradish. Ketchup. Mustard. Meat flavorings and tenderizers. Bouillon cubes. Hot sauces. Pre-made or packaged marinades. Pre-made or packaged taco seasonings. Relishes. Regular salad dressings. Other foods Salted popcorn and pretzels. The items listed above may not be a complete list of foods and beverages you should avoid. Contact a dietitian for more information. Where to find more information  National Heart, Lung, and Blood Institute: https://wilson-eaton.com/  American Heart Association: www.heart.org  Academy of Nutrition and Dietetics: www.eatright.[place]: www.kidney.org Summary  The DASH eating plan is a  healthy eating plan that has been shown to reduce high blood pressure (hypertension). It may also reduce your risk for type 2 diabetes, heart disease, and stroke.  When on the DASH eating plan, aim to eat more fresh fruits and vegetables, whole grains, lean proteins, low-fat dairy, and heart-healthy fats.  With the DASH  eating plan, you should limit salt (sodium) intake to 2,300 mg a day. If you have hypertension, you may need to reduce your sodium intake to 1,500 mg a day.  Work with your health care provider or dietitian to adjust your eating plan to your individual calorie needs. This information is not intended to replace advice given to you by your health care provider. Make sure you discuss any questions you have with your health care provider. Document Revised: 12/14/2018 Document Reviewed: 12/14/2018 Elsevier Patient Education  2021 [employer].

## 2020-06-04 ENCOUNTER — Telehealth: Payer: Self-pay | Admitting: Family Medicine

## 2020-06-04 ENCOUNTER — Other Ambulatory Visit: Payer: Self-pay

## 2020-06-04 ENCOUNTER — Other Ambulatory Visit: Payer: Self-pay | Admitting: Family Medicine

## 2020-06-04 DIAGNOSIS — E1169 Type 2 diabetes mellitus with other specified complication: Secondary | ICD-10-CM

## 2020-06-04 MED ORDER — LANTUS SOLOSTAR 100 UNIT/ML ~~LOC~~ SOPN: PEN_INJECTOR | SUBCUTANEOUS | 0 refills | Status: DC

## 2020-06-04 MED ORDER — MORPHINE SULFATE ER 30 MG PO TBCR: 30.0000 mg | EXTENDED_RELEASE_TABLET | Freq: Two times a day (BID) | ORAL | 0 refills | Status: DC

## 2020-06-04 NOTE — Telephone Encounter (Signed)
Called pt. I have been unable to send [facility] to [facility]. Sent morphine to [facility].  I discussed her hypoglycemia which she is continuing to have despite dropping her insulin to 30 units and no longer doing mealtime insulin.  Patient is on Synjardy XR.  Apparently last night her sugars were low again and this was after her home health care nurse had recommended she hold her insulin.  I recommend the patient continue to hold Lantus throughout the weekend and monitor sugars.  I will ask Claudine Mouton our pharmacist to touch base with her on Monday or Tuesday.

## 2020-06-05 NOTE — Telephone Encounter (Signed)
Noted  

## 2020-06-06 ENCOUNTER — Other Ambulatory Visit: Payer: Self-pay | Admitting: Hematology and Oncology

## 2020-06-06 DIAGNOSIS — R112 Nausea with vomiting, unspecified: Secondary | ICD-10-CM

## 2020-06-06 DIAGNOSIS — T451X5A Adverse effect of antineoplastic and immunosuppressive drugs, initial encounter: Secondary | ICD-10-CM

## 2020-06-08 ENCOUNTER — Telehealth: Payer: Self-pay

## 2020-06-08 ENCOUNTER — Telehealth: Payer: PPO

## 2020-06-08 NOTE — Progress Notes (Signed)
Chronic Care Management Pharmacy Assistant   Name: Allannah Kempen  MRN: [id number] DOB: 01-09-1971   Reason for Encounter: Disease State For glucose numbers    Medications: Outpatient Encounter Medications as of 06/08/2020  Medication Sig Note  . Blood Glucose Monitoring Suppl (ONETOUCH VERIO REFLECT) w/Device KIT AS DIRECTED   . buPROPion (WELLBUTRIN XL) 300 MG 24 hr tablet Take 1 tablet (300 mg total) by mouth every evening.   . calcium citrate-vitamin D (CITRACAL+D) 315-200 MG-UNIT tablet Take 1 tablet by mouth 2 (two) times daily.   . clotrimazole-betamethasone (LOTRISONE) cream Apply small amount to affected area twice daily (Patient taking differently: as needed. Apply small amount to affected area twice daily)   . Continuous Blood Gluc Receiver (FREESTYLE LIBRE 2 READER) DEVI E11.69 Check blood sugar 4 times daily as directed   . Continuous Blood Gluc Sensor (FREESTYLE LIBRE 2 SENSOR) MISC E11.69 Change sensor every 14 days as directed   . cyclobenzaprine (FLEXERIL) 10 MG tablet TAKE ONE TABLET BY MOUTH EVERY 8 HOURS AS NEEDED FOR MUSCLE SPASMS   . dicyclomine (BENTYL) 20 MG tablet TAKE ONE TABLET BY MOUTH BEFORE MEALS AND AT BEDTIME AS NEEDED FOR STOMACH CRAMPING   . Dulaglutide (TRULICITY) 6.30 ZS/0.1UX SOPN INJECT ONE SYRINGE ONCE A WEEK   . Empagliflozin-metFORMIN HCl (SYNJARDY) 12.05-998 MG TABS Take 1 tablet by mouth 2 (two) times daily.   . famotidine (PEPCID) 20 MG tablet Take 1 tablet (20 mg total) by mouth 2 (two) times daily.   . ferrous sulfate 325 (65 FE) MG tablet Take 325 mg by mouth every evening.   . gabapentin (NEURONTIN) 300 MG capsule TAKE ONE CAPSULE BY MOUTH THREE TIMES DAILY   . insulin glargine (LANTUS SOLOSTAR) 100 UNIT/ML Solostar Pen INJECT 30 UNITS SUBCUTANEOUSLY EVERYDAY AT BEDTIME   . Insulin Pen Needle (BD PEN NEEDLE NANO U/F) 32G X 4 MM MISC Inject 1 each into the skin in the morning and at bedtime.   . Levomilnacipran HCl ER (FETZIMA) 80 MG  CP24 Take 1 capsule by mouth daily.   Marland Kitchen levothyroxine (SYNTHROID) 75 MCG tablet Take 1 tablet (75 mcg total) by mouth daily.   Marland Kitchen loratadine (CLARITIN) 10 MG tablet Take 10 mg by mouth daily. Taking 3-4 days each week due to bone pain associated with shot from cancer center.   Marland Kitchen LORazepam (ATIVAN) 0.5 MG tablet TAKE ONE TABLET BY MOUTH TWICE DAILY AS NEEDED FOR ANXIETY   . losartan (COZAAR) 50 MG tablet Take 1 tablet (50 mg total) by mouth every evening.   . Magnesium 500 MG CAPS 500 mg.   . morphine (MS CONTIN) 30 MG 12 hr tablet Take 1 tablet (30 mg total) by mouth every 12 (twelve) hours.   . Multiple Vitamin (MULTIVITAMIN WITH MINERALS) TABS tablet Take 1 tablet by mouth daily.  06/08/2018: Pt plans on purchasing again when she can get out.  . ondansetron (ZOFRAN) 4 MG tablet TAKE ONE TABLET BY MOUTH EVERY 4 HOURS AS NEEDED FOR NAUSEA   . OneTouch Delica Lancets 32T MISC 1 each by Does not apply route daily before breakfast. Check blood sugar twice daily.   . pantoprazole (PROTONIX) 40 MG tablet Take 1 tablet (40 mg total) by mouth 2 (two) times daily.   . Pegfilgrastim-jmdb (FULPHILA [place]) Inject into the skin.   . potassium chloride (KLOR-CON) 20 MEQ packet 20 mEq in the morning and at bedtime. (Patient not taking: Reported on 06/02/2020)   . Potassium Chloride ER 20  MEQ TBCR Take 1 tablet by mouth daily.   . pravastatin (PRAVACHOL) 20 MG tablet Take 1 tablet (20 mg total) by mouth at bedtime.   . prochlorperazine (COMPAZINE) 10 MG tablet Take 1 tablet (10 mg total) by mouth every 6 (six) hours as needed for nausea or vomiting.   . prochlorperazine (COMPAZINE) 10 MG tablet Take 1 tablet (10 mg total) by mouth every 6 (six) hours as needed for nausea or vomiting.   Marland Kitchen rOPINIRole (REQUIP) 0.25 MG tablet Take 1 tablet (0.25 mg total) by mouth at bedtime.   . senna (SENOKOT) 8.6 MG tablet Take 1 tablet by mouth daily as needed for constipation.   . SODIUM FLUORIDE 5000 SENSITIVE 1.1-5 % GEL Take by  mouth 2 (two) times daily.   Marland Kitchen Specialty Vitamins Products (MAGNESIUM, AMINO ACID CHELATE,) 133 MG tablet Take 1 tablet by mouth at bedtime.   . sucralfate (CARAFATE) 1 g tablet Take 1 tablet (1 g total) by mouth 4 (four) times daily -  with meals and at bedtime.   . Vitamin D, Ergocalciferol, (DRISDOL) 1.25 MG (50000 UNIT) CAPS capsule TAKE ONE CAPSULE BY MOUTH ONCE WEEKLY ON FRIDAY   . zolpidem (AMBIEN) 10 MG tablet TAKE ONE TABLET BY MOUTH EVERYDAY AT BEDTIME AS NEEDED    No facility-administered encounter medications on file as of 06/08/2020.    Donette Larry, CPP, asked me to call patient and get some blood sugar numbers.  The patient had been holding insulin per Health Nurse and Dr. Tobie Poet recommendations due to hypoglycemia episodes.    Spoke to patient, she stated she has been eating things she normally wouldn't to try and keep her blood sugar elevated some, due to it dropping so fast.   The higher numbers are when she is intentionally trying to elevate her numbers.  She stated she is eating before bed as well to try and keep alarm from going off every few minutes so she can sleep.  Patient stated she is feeling pretty good today.   Her glucose this morning was 104.  Her average the past 7 days has been 104  May 13 numbers:  1st reading that morning was 58,  Other reading are :  66, 67, 85, 79, 126, 151, 126, 160, 107, 80, 149, 112, 101, 112, 117, 104, 97, 110, 93, 71, 72, 143, 161, 159, 120  May 14 numbers: 146, 143, 150, 133, 103, 94, 96, 81, 140, 111, 80, 90, 102,   May 15 numbers: 107, 123, 99, 73, 65, 67, Jack, [place] [employer] manager [phone]

## 2020-06-10 ENCOUNTER — Encounter: Payer: Self-pay | Admitting: Hematology and Oncology

## 2020-06-16 ENCOUNTER — Ambulatory Visit: Payer: PPO | Admitting: Sports Medicine

## 2020-06-17 ENCOUNTER — Other Ambulatory Visit: Payer: Self-pay | Admitting: Family Medicine

## 2020-06-17 DIAGNOSIS — Z1509 Genetic susceptibility to other malignant neoplasm: Secondary | ICD-10-CM

## 2020-06-17 DIAGNOSIS — Z1501 Genetic susceptibility to malignant neoplasm of breast: Secondary | ICD-10-CM

## 2020-06-21 ENCOUNTER — Other Ambulatory Visit: Payer: Self-pay | Admitting: Family Medicine

## 2020-06-21 DIAGNOSIS — Z1501 Genetic susceptibility to malignant neoplasm of breast: Secondary | ICD-10-CM

## 2020-06-21 DIAGNOSIS — G2581 Restless legs syndrome: Secondary | ICD-10-CM

## 2020-06-21 DIAGNOSIS — E1169 Type 2 diabetes mellitus with other specified complication: Secondary | ICD-10-CM

## 2020-06-23 ENCOUNTER — Ambulatory Visit: Payer: PPO | Admitting: Sports Medicine

## 2020-06-23 ENCOUNTER — Other Ambulatory Visit: Payer: Self-pay

## 2020-06-23 ENCOUNTER — Telehealth: Payer: Self-pay

## 2020-06-23 DIAGNOSIS — Z8739 Personal history of other diseases of the musculoskeletal system and connective tissue: Secondary | ICD-10-CM

## 2020-06-23 DIAGNOSIS — S90812A Abrasion, left foot, initial encounter: Secondary | ICD-10-CM | POA: Diagnosis not present

## 2020-06-23 DIAGNOSIS — E08621 Diabetes mellitus due to underlying condition with foot ulcer: Secondary | ICD-10-CM | POA: Diagnosis not present

## 2020-06-23 DIAGNOSIS — L97521 Non-pressure chronic ulcer of other part of left foot limited to breakdown of skin: Secondary | ICD-10-CM

## 2020-06-23 DIAGNOSIS — T451X5A Adverse effect of antineoplastic and immunosuppressive drugs, initial encounter: Secondary | ICD-10-CM

## 2020-06-23 DIAGNOSIS — D6959 Other secondary thrombocytopenia: Secondary | ICD-10-CM | POA: Diagnosis not present

## 2020-06-23 MED ORDER — MORPHINE SULFATE ER 30 MG PO TBCR: 30.0000 mg | EXTENDED_RELEASE_TABLET | Freq: Two times a day (BID) | ORAL | 0 refills | Status: DC

## 2020-06-23 NOTE — Chronic Care Management (AMB) (Signed)
Chronic Care Management Pharmacy Assistant   Name: Linda [name]  MRN: [id number] DOB: 16-Jan-1971  Reason for Encounter: Medication Adherence and Delivery Coordination  Recent office visits:  None since last CCM Visit 06/04/20- PCP decreased lantus solostar to 30 units at bedtime.  06/23/20- Patient states off of lantus totally    Recent consult visits:  None since last [facility] visits:  04/08/20 ED visit- No admission  Medication Reconciliation was completed by comparing discharge summary, patient's EMR and Pharmacy list, and upon discussion with patient.  Admitted to the hospital on 04/10/20 due to Osteomyelitis right ankle and foot. Discharge date was 04/12/20. Discharged from [facility]?Medications Started at [facility] Discharge:?? -no data available  Medication Changes at Hospital Discharge: -no data available  Medications Discontinued at Hospital Discharge: -no data available  Medications that remain the same after Hospital Discharge:??  -All other medications will remain the same.    Medications: Outpatient Encounter Medications as of 06/23/2020  Medication Sig Note  . Blood Glucose Monitoring Suppl (ONETOUCH VERIO REFLECT) w/Device KIT AS DIRECTED   . buPROPion (WELLBUTRIN XL) 300 MG 24 hr tablet Take 1 tablet (300 mg total) by mouth every evening.   . calcium citrate-vitamin D (CITRACAL+D) 315-200 MG-UNIT tablet Take 1 tablet by mouth 2 (two) times daily.   . clotrimazole-betamethasone (LOTRISONE) cream Apply small amount to affected area twice daily (Patient taking differently: as needed. Apply small amount to affected area twice daily)   . Continuous Blood Gluc Receiver (FREESTYLE LIBRE 2 READER) DEVI E11.69 Check blood sugar 4 times daily as directed   . Continuous Blood Gluc Sensor (FREESTYLE LIBRE 2 SENSOR) MISC E11.69 Change sensor every 14 days as directed   . cyclobenzaprine (FLEXERIL) 10 MG tablet TAKE ONE TABLET BY MOUTH  EVERY 8 HOURS AS NEEDED FOR MUSCLE SPASMS   . dicyclomine (BENTYL) 20 MG tablet TAKE ONE TABLET BY MOUTH BEFORE MEALS AND AT BEDTIME AS NEEDED FOR STOMACH CRAMPING   . Dulaglutide (TRULICITY) 9.93 ZJ/6.9CV SOPN INJECT ONE SYRINGE ONCE A WEEK   . Empagliflozin-metFORMIN HCl (SYNJARDY) 12.05-998 MG TABS Take 1 tablet by mouth 2 (two) times daily.   . famotidine (PEPCID) 20 MG tablet Take 1 tablet (20 mg total) by mouth 2 (two) times daily.   . ferrous sulfate 325 (65 FE) MG tablet Take 325 mg by mouth every evening.   . gabapentin (NEURONTIN) 300 MG capsule TAKE ONE CAPSULE BY MOUTH THREE TIMES DAILY   . insulin glargine (LANTUS SOLOSTAR) 100 UNIT/ML Solostar Pen INJECT 30 UNITS SUBCUTANEOUSLY EVERYDAY AT BEDTIME   . Insulin Pen Needle (BD PEN NEEDLE NANO U/F) 32G X 4 MM MISC Inject 1 each into the skin in the morning and at bedtime.   . Levomilnacipran HCl ER (FETZIMA) 80 MG CP24 Take 1 capsule by mouth daily.   Marland Kitchen levothyroxine (SYNTHROID) 75 MCG tablet Take 1 tablet (75 mcg total) by mouth daily.   Marland Kitchen loratadine (CLARITIN) 10 MG tablet Take 10 mg by mouth daily. Taking 3-4 days each week due to bone pain associated with shot from cancer center.   Marland Kitchen LORazepam (ATIVAN) 0.5 MG tablet TAKE ONE TABLET BY MOUTH TWICE DAILY AS NEEDED FOR ANXIETY   . losartan (COZAAR) 50 MG tablet Take 1 tablet (50 mg total) by mouth every evening.   . Magnesium 500 MG CAPS 500 mg.   . morphine (MS CONTIN) 30 MG 12 hr tablet Take 1 tablet (30 mg total)  by mouth every 12 (twelve) hours.   . Multiple Vitamin (MULTIVITAMIN WITH MINERALS) TABS tablet Take 1 tablet by mouth daily.  06/08/2018: Pt plans on purchasing again when she can get out.  . ondansetron (ZOFRAN) 4 MG tablet TAKE ONE TABLET BY MOUTH EVERY 4 HOURS AS NEEDED FOR NAUSEA   . OneTouch Delica Lancets 44H MISC 1 each by Does not apply route daily before breakfast. Check blood sugar twice daily.   . pantoprazole (PROTONIX) 40 MG tablet Take 1 tablet (40 mg total) by  mouth 2 (two) times daily.   . Pegfilgrastim-jmdb (FULPHILA [place]) Inject into the skin.   . potassium chloride (KLOR-CON) 20 MEQ packet 20 mEq in the morning and at bedtime. (Patient not taking: Reported on 06/02/2020)   . potassium chloride (MICRO-K) 10 MEQ CR capsule TAKE TWO CAPSULES BY MOUTH TWICE DAILY   . Potassium Chloride ER 20 MEQ TBCR Take 1 tablet by mouth daily.   . pravastatin (PRAVACHOL) 20 MG tablet Take 1 tablet (20 mg total) by mouth at bedtime.   . prochlorperazine (COMPAZINE) 10 MG tablet Take 1 tablet (10 mg total) by mouth every 6 (six) hours as needed for nausea or vomiting.   . prochlorperazine (COMPAZINE) 10 MG tablet Take 1 tablet (10 mg total) by mouth every 6 (six) hours as needed for nausea or vomiting.   Marland Kitchen rOPINIRole (REQUIP) 0.25 MG tablet TAKE ONE TABLET BY MOUTH EVERYDAY AT BEDTIME   . senna (SENOKOT) 8.6 MG tablet Take 1 tablet by mouth daily as needed for constipation.   . SODIUM FLUORIDE 5000 SENSITIVE 1.1-5 % GEL Take by mouth 2 (two) times daily.   Marland Kitchen Specialty Vitamins Products (MAGNESIUM, AMINO ACID CHELATE,) 133 MG tablet Take 1 tablet by mouth at bedtime.   . sucralfate (CARAFATE) 1 g tablet Take 1 tablet (1 g total) by mouth 4 (four) times daily -  with meals and at bedtime.   . Vitamin D, Ergocalciferol, (DRISDOL) 1.25 MG (50000 UNIT) CAPS capsule TAKE ONE CAPSULE BY MOUTH ONCE WEEKLY ON FRIDAY   . zolpidem (AMBIEN) 10 MG tablet TAKE ONE TABLET BY MOUTH EVERYDAY AT BEDTIME AS NEEDED    No facility-administered encounter medications on file as of 06/23/2020.   BP Readings from Last 3 Encounters:  05/20/20 131/65  04/23/20 138/60  04/16/20 130/80    Lab Results  Component Value Date   HGBA1C 7.4 (H) 04/23/2020      No OVs, Consults, or hospital visits since last care coordination call / Pharmacist visit. No medication changes indicated   Patient obtains medications through Vials  30 Days   Last adherence delivery date: 06/04/20 Polyethylene  glycol 3350- Dissolve 17 grams (one capful) and drink as directed once daily Prochlorperazine - 10 mg- 1 tablet every 6 hours PRN nausea and vomiting Ropinirole - 0.25 mg- 1 tablet daily (bedtime) Synjardy- 12.5-1,000 mg - 1 tablet twice daily Pantoprazole -40 mg 1 tablet twice daily (1-before breakfast and 1- evening meal) Zolpidem- 10 mg- 1 tablet daily (bedtime) Ondansetron 4 mg- 1 tablet every 4 hours prn nauesea Levothyroxine 75 mcg- 1 tablet daily Lantus solostar- inject 68 units daily (bedtime) Losartan 50 mg- 1 tablet daily (evening meal) Trulicity 6.75/9.1 -Inject 0.75 mg into skin once a week Bupropion XL 300 mg -1 tablet daily (bedtime) Famotidine 20 mg- 1 tablet twice daily Fetzima. ER 80 mg- 1 capsule daily Pravastatin 20 mg- 1 tablet daily (bedtime) Potassium CL ER 20 meq- 1 tablet daily Morphine ER 30 mg-  1 tablet every 12 hours Vitamin D 50,000 units- One capsule weekly (Friday) Freestyle kit 2 Sensor- Use as directed and change every 14 days.       Patient declined the following medications last delivery: Gabapentin- has enough on hand    Patient is due for next adherence delivery on: 07/02/20  Spoke with patient on 06/23/20 and reviewed medications and coordinated delivery.  This delivery to include: Vials  30 Days  Ropinirole - 0.25 mg- 1 tablet daily (bedtime) Synjardy- 12.5-1,000 mg - 1 tablet twice daily Pantoprazole -40 mg 1 tablet twice daily (1-before breakfast and 1- evening meal) Zolpidem- 10 mg- 1 tablet daily (bedtime) Levothyroxine 75 mcg- 1 tablet daily Gabapentin 300 mg -1 capsule 3 times daily Losartan 50 mg- 1 tablet daily (evening meal) Bupropion XL 300 mg -1 tablet daily (bedtime) Famotidine 20 mg- 1 tablet twice daily Fetzima. ER 80 mg- 1 capsule daily Pravastatin 20 mg- 1 tablet daily (bedtime) Potassium CL ER 20 meq- 2 caps twice daily *should be in capsule form Morphine ER 30 mg-  1 tablet every 12 hours Vitamin D 50,000 units- One  capsule weekly (Friday) Freestyle kit 2 Sensor- Use as directed and change every 14 days.   Patient declined the following medications this month: Polyethylene glycol 3350- Dissolve 17 grams (one capful) and drink as directed once daily- Has 2 full containers on hand. Lantus solostar- inject 30 units daily (bedtime)- Dr. Tobie Poet discontinued Prochlorperazine - 10 mg- 1 tablet every 6 hours PRN nausea and vomiting- Has enough on hand Ondansetron 4 mg- 1 tablet every 4 hours prn nauesea- Has enough on hand Trulicity 5.27/7.8 -Inject 0.75 mg into skin once a week- has 1 months worth on hand  Refills requested from PCP include:  Morphine from Dr. Tobie Poet.  Confirmed delivery date of 07/02/20, advised patient that pharmacy will contact her the morning of delivery.  Recent blood pressure readings are as follows: States she has not been checking.   Recent blood glucose readings are as follows: Fasting: 140's but notes she has eaten more than she normally does due to having cookouts over weekend.  Notes low of 58 one day.   Follow-Up:  Comptroller and Pharmacist Review  Sherre Poot, CPP notified  Margaretmary Dys, [place] Pharmacy Assistant [phone]

## 2020-06-23 NOTE — Progress Notes (Signed)
Subjective: Linda [name] is a 50 y.o. female patient seen in office for follow-up evaluation of ulceration of the left 2nd toe after discharge from [facility] on antibiotics for osteomyelitis. Patient has a history of diabetes and a blood glucose level today of 171m/dl, Denies nausea/fever/vomiting/chills/night sweats/SOB, reports that toe is doing good. Reports that she has noticed a scab on her heel for the last few days, nothing open, no pain, no redness, no swelling. Tried aquaphor. No other issues noted.    Patient Active Problem List   Diagnosis Date Noted  . Acute postoperative respiratory insufficiency 04/17/2020  . AKI (acute kidney injury) ([facility] 04/17/2020  . 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 Osteomyelitis ([facility] [facility] 04/17/2020  . Diabetic ulcer of toe of left foot associated with type 2 diabetes mellitus, with fat layer exposed ([facility] 03/27/2020  . Dehydration 03/16/2020  . Hypokalemia 03/09/2020  . Chemotherapy-induced thrombocytopenia 02/06/2020  . Thrombocytopenia, unspecified ([facility] 01/27/2020  . Malignant neoplasm of lower-inner quadrant of right breast of female, estrogen receptor negative ([facility] 10/01/2019  . Chronic pain syndrome 07/08/2019  . Memory loss 07/08/2019  . Depression, major, recurrent, mild ([facility] 07/08/2019  . Uncomplicated opioid dependence ([facility] 07/08/2019  . Mixed hyperlipidemia 04/11/2019  . Dyslipidemia associated with type 2 diabetes mellitus ([facility] 04/11/2019  . Essential hypertension, benign 04/11/2019  . Major depressive disorder, single episode, mild ([facility] 04/11/2019  . Acquired hypothyroidism 04/11/2019  . Vitamin D insufficiency 04/11/2019  . Class 2 severe obesity due to excess calories with serious comorbidity and body mass index (BMI) of 35.0 to 35.9 in adult ([facility] 04/11/2019  . Pre-ulcerative calluses 03/04/2019  . Liver cirrhosis secondary to NASH (nonalcoholic steatohepatitis) ([facility] 09/13/2018    Class: Chronic   . BRCA1 positive 06/14/2016   Current Outpatient Medications on File Prior to Visit  Medication Sig Dispense Refill  . Blood Glucose Monitoring Suppl (ONETOUCH VERIO REFLECT) w/Device KIT AS DIRECTED    . buPROPion (WELLBUTRIN XL) 300 MG 24 hr tablet Take 1 tablet (300 mg total) by mouth every evening. 90 tablet 1  . calcium citrate-vitamin D (CITRACAL+D) 315-200 MG-UNIT tablet Take 1 tablet by mouth 2 (two) times daily.    . clotrimazole-betamethasone (LOTRISONE) cream Apply small amount to affected area twice daily (Patient taking differently: as needed. Apply small amount to affected area twice daily) 45 g 0  . Continuous Blood Gluc Receiver (FREESTYLE LIBRE 2 READER) DEVI E11.69 Check blood sugar 4 times daily as directed 1 each 0  . Continuous Blood Gluc Sensor (FREESTYLE LIBRE 2 SENSOR) MISC E11.69 Change sensor every 14 days as directed 6 each 3  . cyclobenzaprine (FLEXERIL) 10 MG tablet TAKE ONE TABLET BY MOUTH EVERY 8 HOURS AS NEEDED FOR MUSCLE SPASMS 30 tablet 3  . dicyclomine (BENTYL) 20 MG tablet TAKE ONE TABLET BY MOUTH BEFORE MEALS AND AT BEDTIME AS NEEDED FOR STOMACH CRAMPING 180 tablet 1  . Dulaglutide (TRULICITY) 08.78MMV/6.7MCSOPN INJECT ONE SYRINGE ONCE A WEEK 6 mL 1  . Empagliflozin-metFORMIN HCl (SYNJARDY) 12.05-998 MG TABS Take 1 tablet by mouth 2 (two) times daily. 180 tablet 0  . famotidine (PEPCID) 20 MG tablet Take 1 tablet (20 mg total) by mouth 2 (two) times daily. 180 tablet 1  . ferrous sulfate 325 (65 FE) MG tablet Take 325 mg by mouth every evening.    . gabapentin (NEURONTIN) 300 MG capsule TAKE ONE CAPSULE BY MOUTH THREE TIMES DAILY 270 capsule 0  . Levomilnacipran HCl ER (FETZIMA) 80 MG  CP24 Take 1 capsule by mouth daily. 90 capsule 1  . levothyroxine (SYNTHROID) 75 MCG tablet Take 1 tablet (75 mcg total) by mouth daily. 90 tablet 1  . loratadine (CLARITIN) 10 MG tablet Take 10 mg by mouth daily. Taking 3-4 days each week due to bone pain associated with shot  from cancer center.    Marland Kitchen LORazepam (ATIVAN) 0.5 MG tablet TAKE ONE TABLET BY MOUTH TWICE DAILY AS NEEDED FOR ANXIETY 30 tablet 2  . losartan (COZAAR) 50 MG tablet Take 1 tablet (50 mg total) by mouth every evening. 90 tablet 1  . Magnesium 500 MG CAPS 500 mg.    . morphine (MS CONTIN) 30 MG 12 hr tablet Take 1 tablet (30 mg total) by mouth every 12 (twelve) hours. 60 tablet 0  . Multiple Vitamin (MULTIVITAMIN WITH MINERALS) TABS tablet Take 1 tablet by mouth daily.     . ondansetron (ZOFRAN) 4 MG tablet TAKE ONE TABLET BY MOUTH EVERY 4 HOURS AS NEEDED FOR NAUSEA 90 tablet 0  . OneTouch Delica Lancets 16X MISC 1 each by Does not apply route daily before breakfast. Check blood sugar twice daily. 100 each 3  . pantoprazole (PROTONIX) 40 MG tablet Take 1 tablet (40 mg total) by mouth 2 (two) times daily. 180 tablet 1  . Pegfilgrastim-jmdb (FULPHILA [place]) Inject into the skin.    . potassium chloride (KLOR-CON) 20 MEQ packet 20 mEq in the morning and at bedtime. (Patient not taking: Reported on 06/02/2020)    . potassium chloride (MICRO-K) 10 MEQ CR capsule TAKE TWO CAPSULES BY MOUTH TWICE DAILY 180 capsule 1  . Potassium Chloride ER 20 MEQ TBCR Take 1 tablet by mouth daily. 90 tablet 1  . pravastatin (PRAVACHOL) 20 MG tablet Take 1 tablet (20 mg total) by mouth at bedtime. 90 tablet 1  . prochlorperazine (COMPAZINE) 10 MG tablet Take 1 tablet (10 mg total) by mouth every 6 (six) hours as needed for nausea or vomiting. 90 tablet 3  . prochlorperazine (COMPAZINE) 10 MG tablet Take 1 tablet (10 mg total) by mouth every 6 (six) hours as needed for nausea or vomiting. 90 tablet 3  . rOPINIRole (REQUIP) 0.25 MG tablet TAKE ONE TABLET BY MOUTH EVERYDAY AT BEDTIME 30 tablet 2  . senna (SENOKOT) 8.6 MG tablet Take 1 tablet by mouth daily as needed for constipation.    . SODIUM FLUORIDE 5000 SENSITIVE 1.1-5 % GEL Take by mouth 2 (two) times daily.    Marland Kitchen Specialty Vitamins Products (MAGNESIUM, AMINO ACID CHELATE,)  133 MG tablet Take 1 tablet by mouth at bedtime.    . sucralfate (CARAFATE) 1 g tablet Take 1 tablet (1 g total) by mouth 4 (four) times daily -  with meals and at bedtime. 270 tablet 1  . Vitamin D, Ergocalciferol, (DRISDOL) 1.25 MG (50000 UNIT) CAPS capsule TAKE ONE CAPSULE BY MOUTH ONCE WEEKLY ON FRIDAY 12 capsule 1  . zolpidem (AMBIEN) 10 MG tablet TAKE ONE TABLET BY MOUTH EVERYDAY AT BEDTIME AS NEEDED 30 tablet 5   No current facility-administered medications on file prior to visit.   Allergies  Allergen Reactions  . Codeine Shortness Of Breath    Other reaction(s): SHOB  . Celecoxib Other (See Comments)    Unknown reaction Other reaction(s): Unknown  . Ezetimibe     Other reaction(s): Unknown  . Ezetimibe-Simvastatin Other (See Comments)    Unknown reaction  . Propranolol     Other reaction(s): Unknown  . Propranolol Hcl Other (See  Comments)    Unknown reaction  . Simvastatin     Other reaction(s): Unknown      Objective: There were no vitals filed for this visit.  General: Patient is awake, alert, oriented x 3 and in no acute distress.  Dermatology: Skin is warm and dry bilateral with a healed 2nd toe left, Minimal edema to the toe. There is a abrasion to left medial heel. No acute signs of infection. Nails x 10 elongated and thickened    Vascular: Dorsalis Pedis pulse = 1/4 Bilateral,  Posterior Tibial pulse = 1/4 Bilateral,  Capillary Fill Time < 5 seconds. Trace edema bilateral.   Neurologic: Protective severely diminished bilateral.  Musculosketal: There is no pain to palpation to left second or heel.   No results for input(s): GRAMSTAIN, LABORGA in the last 8760 hours.  Assessment and Plan:  Problem List Items Addressed This Visit      Hematopoietic and Hemostatic   Chemotherapy-induced thrombocytopenia    Other Visit Diagnoses    Diabetic ulcer of toe of left foot associated with diabetes mellitus due to underlying condition, limited to breakdown of  skin ([place])    -  Primary   History of osteomyelitis       Abrasion of left heel, initial encounter            -Examined patient and discussed the progression of the wound and treatment alternatives -Left 2nd toe wound is healed -At no charge trimmed nails with sterile nail nipper without incident -Recommend patient to continue with aquaphor on her left heel and to cut back on soaking -Patient to be measured for diabetic shoe -Patient to return to office in 8-10 weeks for diabetic nail care Landis Martins, DPM

## 2020-06-26 ENCOUNTER — Other Ambulatory Visit: Payer: Self-pay | Admitting: Family Medicine

## 2020-07-09 ENCOUNTER — Telehealth: Payer: PPO

## 2020-07-09 NOTE — Progress Notes (Deleted)
Chronic Care Management Pharmacy Note  07/09/2020 Name:  Linda [name] MRN:  [id number] DOB:  Jun 08, 1970   Plan recommendatons:  Patient's foot is healing well.  She is following up with oncologist every 3 months but not actively undergoing treatment.  Patient is experiencing low blood sugar more regularly. While on the phone her blood sugar dropped to 68 mg/dL after eating within the past few hours. Pharmacist consulted with Dr. Tobie Poet and advised patient to increase Synjardy dose to twice daily as previously recommended and reduce Lantus dose to 30 units daily. Will continue to work on increasing Trulicity once stable on Linda [name] with hopes of discontinuing insulin.   Subjective: Linda [name] is an 50 y.o. year old female who is a primary patient of [name], Kirsten, MD.  The CCM team was consulted for assistance with disease management and care coordination needs.    Engaged with patient by telephone for follow up visit in response to provider referral for pharmacy case management and/or care coordination services.   Consent to Services:  The patient was given information about Chronic Care Management services, agreed to services, and gave verbal consent prior to initiation of services.  Please see initial visit note for detailed documentation.   Patient Care Team: [name], MD as PCP - General (Family Medicine) Burnice Logan, [facility] as Pharmacist (Pharmacist)  Recent office visits: 02/11/2020 - continue synjardy 12.05/998 mg daily. 6 units of Novolog before meals if sugar >150.  10/09/2019 - flu shot given. Consider reduction in diabetes medication in the future if A1C stays excellent.   Recent Consult Visit: 06/23/20 - podiatry - diabetic nail care. Measured for diabetic shoes. Toe wound is healed. Recommended aquaphor for heel abrasion.    05/26/20-podiatry, Diabetic ulcer of toe of left foot associated with diabetes mellitus due to underlying condition, limited to  breakdown of skin-   05/20/20-Oncology-Labs, prochlorperazine (COMPAZINE) 10 MG tablet,  She will proceed with antibiotic therapy per podiatry. I will order the abdominal ultrasound and we will plan to discuss these results over the phone. She will see Dr. Melina Copa in a couple of months. I will obtain iron studies and forward to Dr. Melina Copa for that appointment. I will refill her compazine today. We will see her back in 3 months for repeat CBC, CMP and clinical evaluation.    05/01/20-podiatry-wound check, Continue with PICC line antibiotics long-term for history of osteomyelitis left second toe has approximately 2 more weeks left, Continue with compression garments to assist with edema control -Patient to return to office in 2-3 weeks for follow up care and evaluation or sooner if problems arise. 02/24/2020 - oncology visit. Continuing paclitaxel.  02/21/2020 - Podiatry - debridement of second toe ulcer performed.   Hospital visits: None in previous 6 months  Objective:  Lab Results  Component Value Date   CREATININE 0.8 05/20/2020   BUN 14 05/20/2020   GFRNONAA >60 11/27/2019   GFRAA 73 10/09/2019   NA 139 05/20/2020   K 4.2 05/20/2020   CALCIUM 9.7 05/20/2020   CO2 28 (A) 05/20/2020    Lab Results  Component Value Date/Time   HGBA1C 7.4 (H) 04/23/2020 02:04 PM   HGBA1C 6.9 (H) 10/09/2019 09:47 AM   MICROALBUR 30 10/09/2019 11:56 AM   MICROALBUR 80 07/08/2019 09:51 AM    Last diabetic Eye exam:  Lab Results  Component Value Date/Time   HMDIABEYEEXA No Retinopathy 04/30/2020 12:00 AM    Last diabetic Foot exam: No results found for: HMDIABFOOTEX  Lab Results  Component Value Date   CHOL 181 10/09/2019   HDL 43 10/09/2019   LDLCALC 112 (H) 10/09/2019   TRIG 146 10/09/2019   CHOLHDL 4.2 10/09/2019    Hepatic Function Latest Ref Rng & Units 05/20/2020 04/23/2020 04/16/2020  Total Protein 6.0 - 8.5 g/dL - 6.8 6.1(A)  Albumin 3.5 - 5.0 4.5 4.2 3.5  AST 13 - 35 _0 ALT  7 - 35 _1 Alk Phosphatase 25 - 125 74 94 89  Total Bilirubin 0.0 - 1.2 mg/dL - 0.5 -    Lab Results  Component Value Date/Time   TSH 3.920 04/23/2020 02:04 PM   TSH 1.450 07/08/2019 12:00 AM    CBC Latest Ref Rng & Units 05/20/2020 04/23/2020 04/16/2020  WBC - 5.2 5.5 5.0  Hemoglobin 12.0 - 16.0 12.5 11.3 10.7(A)  Hematocrit 36 - 46 39 35.5 33(A)  Platelets 150 - 399 89(A) 128(L) 92(A)    Lab Results  Component Value Date/Time   VD25OH 53.8 04/23/2020 02:04 PM    Clinical ASCVD: No  The 10-year ASCVD risk score Linda Bussing DC Jr., et al., 2013) is: 4.3%   Values used to calculate the score:     Age: 18 years     Sex: Female     Is Non-Hispanic African American: No     Diabetic: Yes     Tobacco smoker: No     Systolic Blood Pressure: 370 mmHg     Is BP treated: Yes     HDL Cholesterol: 43 mg/dL     Total Cholesterol: 181 mg/dL    Depression screen [facility] 2/9 03/09/2020 10/09/2019 06/26/2019  Decreased Interest _2 Down, Depressed, Hopeless _3 PHQ - 2 Score _4 Altered sleeping _5 Tired, decreased energy _6 Change in appetite _7 Feeling bad or failure about yourself  2 0 0  Trouble concentrating _8 Moving slowly or fidgety/restless 2 3 0  Suicidal thoughts 0 0 0  PHQ-9 Score _9 Difficult doing work/chores - Somewhat difficult -     Social History   Tobacco Use  Smoking Status Never  Smokeless Tobacco Never   BP Readings from Last 3 Encounters:  05/20/20 131/65  04/23/20 138/60  04/16/20 130/80   Pulse Readings from Last 3 Encounters:  05/20/20 92  04/23/20 80  04/16/20 (!) 105   Wt Readings from Last 3 Encounters:  05/20/20 182 lb 11.2 oz (82.9 kg)  04/23/20 184 lb (83.5 kg)  04/16/20 212 lb 11.2 oz (96.5 kg)    Assessment/Interventions: Review of patient past medical history, allergies, medications, health status, including review of consultants reports, laboratory and other test data, was performed as part of  comprehensive evaluation and provision of chronic care management services.   SDOH:  (Social Determinants of Health) assessments and interventions performed: Yes   CCM Care Plan  Allergies  Allergen Reactions   Codeine Shortness Of Breath    Other reaction(s): SHOB   Celecoxib Other (See Comments)    Unknown reaction Other reaction(s): Unknown   Ezetimibe     Other reaction(s): Unknown   Ezetimibe-Simvastatin Other (See Comments)    Unknown reaction   Propranolol     Other reaction(s): Unknown   Propranolol Hcl Other (See Comments)    Unknown reaction   Simvastatin     Other reaction(s): Unknown    Medications  Reviewed Today     Reviewed by Aquilla Solian, Assunta Found, CMA (Certified Medical Assistant) on 06/23/20 at [id number]  Med List Status: <None>   Medication Order Taking? Sig Documenting Provider Last Dose Status Informant  Blood Glucose Monitoring Suppl (ONETOUCH VERIO REFLECT) w/Device KIT [id number] No AS DIRECTED [provider] Taking Active   buPROPion (WELLBUTRIN XL) 300 MG 24 hr tablet [id number] No Take 1 tablet (300 mg total) by mouth every evening. [name], Kirsten, MD Taking Active   calcium citrate-vitamin D (CITRACAL+D) 315-200 MG-UNIT tablet [id number] No Take 1 tablet by mouth 2 (two) times daily. [provider] Taking Active   clotrimazole-betamethasone (LOTRISONE) cream [id number] No Apply small amount to affected area twice daily  Patient taking differently: as needed. Apply small amount to affected area twice daily   [name], Kirsten, MD Taking Active   Continuous Blood Gluc Receiver (FREESTYLE LIBRE 2 READER) DEVI [id number] No E11.69 Check blood sugar 4 times daily as directed [name], Kirsten, MD Taking Active   Continuous Blood Gluc Sensor (FREESTYLE LIBRE 2 SENSOR) [place] [id number] No E11.69 Change sensor every 14 days as directed [name], Elnita Maxwell, MD Taking Active   cyclobenzaprine (FLEXERIL) 10 MG tablet [id number] No TAKE ONE TABLET BY MOUTH EVERY 8 HOURS AS  NEEDED FOR MUSCLE SPASMS [place] Scrape A, NP Taking Active   dicyclomine (BENTYL) 20 MG tablet [id number] No TAKE ONE TABLET BY MOUTH BEFORE MEALS AND AT BEDTIME AS NEEDED FOR STOMACH CRAMPING [name], Elnita Maxwell, MD Taking Active   Dulaglutide (TRULICITY) 4.23 NT/6.1WE [facility] [facility] [id number] No INJECT ONE SYRINGE ONCE A WEEK [name], Kirsten, MD Taking Active   Empagliflozin-metFORMIN HCl (SYNJARDY) 12.05-998 MG TABS [id number] No Take 1 tablet by mouth 2 (two) times daily. [name], Kirsten, MD Taking Active   famotidine (PEPCID) 20 MG tablet [id number] No Take 1 tablet (20 mg total) by mouth 2 (two) times daily. [name], Kirsten, MD Taking Active   ferrous sulfate 325 (65 FE) MG tablet [id number] No Take 325 mg by mouth every evening. [provider] Taking Active   gabapentin (NEURONTIN) 300 MG capsule [id number]  TAKE ONE CAPSULE BY MOUTH THREE TIMES DAILY [name], MD  Active     Discontinued 06/23/20 [phone] (Completed Course)   Discontinued 06/23/20 4097 (Completed Course)   Levomilnacipran HCl ER (FETZIMA) 80 MG CP24 [id number] No Take 1 capsule by mouth daily. [name], Kirsten, MD Taking Active   levothyroxine (SYNTHROID) 75 MCG tablet [id number] No Take 1 tablet (75 mcg total) by mouth daily. [name], Kirsten, MD Taking Active   loratadine (CLARITIN) 10 MG tablet [id number] No Take 10 mg by mouth daily. Taking 3-4 days each week due to bone pain associated with shot from cancer center. [provider] Taking Active   LORazepam (ATIVAN) 0.5 MG tablet [id number] No TAKE ONE TABLET BY MOUTH TWICE DAILY AS NEEDED FOR ANXIETY [name], Kirsten, MD Taking Active   losartan (COZAAR) 50 MG tablet [id number] No Take 1 tablet (50 mg total) by mouth every evening. [name], MD Taking Active   Magnesium 500 MG CAPS [id number] No 500 mg. [provider] Taking Active   morphine (MS CONTIN) 30 MG 12 hr tablet [id number]  Take 1 tablet (30 mg total) by mouth every 12 (twelve) hours. [name], Kirsten, MD  Active   Multiple  Vitamin (MULTIVITAMIN WITH MINERALS) TABS tablet [id number] No Take 1 tablet by mouth daily.  [provider] Taking Active Self           Med Note (ATKINS, HEATHER L  Fri Jun 08, 2018  9:21 PM) Pt plans on purchasing again when she can get out.  ondansetron (ZOFRAN) 4 MG tablet [id number]  TAKE ONE TABLET BY MOUTH EVERY 4 HOURS AS NEEDED FOR NAUSEA [place] Scrape A, NP  Active   OneTouch Delica Lancets 71I MISC [id number] No 1 each by Does not apply route daily before breakfast. Check blood sugar twice daily. [name], Kirsten, MD Taking Active   pantoprazole (PROTONIX) 40 MG tablet [id number] No Take 1 tablet (40 mg total) by mouth 2 (two) times daily. [name], MD Taking Active   Pegfilgrastim-jmdb [facility]) [id number]  Inject into the skin. [provider]  Active   potassium chloride (KLOR-CON) 20 MEQ packet [id number] No 20 mEq in the morning and at bedtime.  Patient not taking: Reported on 06/02/2020   [provider] Not Taking Active   potassium chloride (MICRO-K) 10 MEQ CR capsule [id number]  TAKE TWO CAPSULES BY MOUTH TWICE DAILY [name], Kirsten, MD  Active   Potassium Chloride ER 20 MEQ TBCR [id number] No Take 1 tablet by mouth daily. [name], Kirsten, MD Taking Active   pravastatin (PRAVACHOL) 20 MG tablet [id number] No Take 1 tablet (20 mg total) by mouth at bedtime. [name], Kirsten, MD Taking Active   prochlorperazine (COMPAZINE) 10 MG tablet [id number] No Take 1 tablet (10 mg total) by mouth every 6 (six) hours as needed for nausea or vomiting. Melodye Ped, NP Taking Active   prochlorperazine (COMPAZINE) 10 MG tablet [id number] No Take 1 tablet (10 mg total) by mouth every 6 (six) hours as needed for nausea or vomiting. [place] Scrape A, NP Taking Active   rOPINIRole (REQUIP) 0.25 MG tablet [id number]  TAKE ONE TABLET BY MOUTH EVERYDAY AT BEDTIME [name], Kirsten, MD  Active   senna (SENOKOT) 8.6 MG tablet [id number] No Take 1 tablet by mouth daily as needed for  constipation. [provider] Taking Active   SODIUM FLUORIDE 5000 SENSITIVE 1.1-5 % GEL [id number] No Take by mouth 2 (two) times daily. [provider] Taking Active   Specialty Vitamins Products (MAGNESIUM, AMINO ACID CHELATE,) 133 MG tablet [id number] No Take 1 tablet by mouth at bedtime. [provider] Taking Active   sucralfate (CARAFATE) 1 g tablet [id number] No Take 1 tablet (1 g total) by mouth 4 (four) times daily -  with meals and at bedtime. 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 2 diabetes mellitus, with fat layer exposed ([place]) 03/27/2020   Dehydration 03/16/2020   Hypokalemia 03/09/2020   Chemotherapy-induced thrombocytopenia 02/06/2020   Thrombocytopenia, unspecified ([place]) 01/27/2020   Malignant neoplasm of lower-inner quadrant of right breast of female, estrogen receptor negative ([place]) 10/01/2019   Chronic pain syndrome 07/08/2019   Memory loss 07/08/2019   Depression, major, recurrent, mild ([place]) 77/41/2878   Uncomplicated opioid dependence ([place]) 07/08/2019   Mixed hyperlipidemia 04/11/2019  Dyslipidemia associated with type 2 diabetes mellitus ([place]) 04/11/2019   Essential hypertension, benign 04/11/2019   Major depressive disorder, single episode, mild ([place]) 04/11/2019   Acquired hypothyroidism 04/11/2019   Vitamin D insufficiency 04/11/2019   Class  2 severe obesity due to excess calories with serious comorbidity and body mass index (BMI) of 35.0 to 35.9 in adult ([place]) 04/11/2019   Pre-ulcerative calluses 03/04/2019   Liver cirrhosis secondary to NASH (nonalcoholic steatohepatitis) ([place]) 09/13/2018    Class: Chronic   BRCA1 positive 06/14/2016    Immunization History  Administered Date(s) Administered   Hepatitis B 07/10/2019   Hepatitis B, adult 03/11/2020   Influenza Inj Mdck Quad Pf 10/09/2019   Influenza-Unspecified 09/14/2018   PFIZER(Purple Top)SARS-COV-2 Vaccination 04/18/2019, 05/13/2019, 01/24/2020   Pneumococcal Polysaccharide-23 04/27/2012    Conditions to be addressed/monitored:  Hypertension, Hyperlipidemia, Diabetes, Hypothyroidism and Depression  There are no care plans that you recently modified to display for this patient.    Medication Assistance: None required.  Patient affirms current coverage meets needs.  Patient's preferred pharmacy is:  [facility] - [place], [place] - [address] [place] [address] Phone: [phone] Fax: [phone]  [place] (New Address) - [place], [place] [address] Previously: [address], [address] [address] Phone: [phone] Fax: [place], [place] [address] Phone: [phone] Fax: [phone]  Uses pill box? Yes Pt endorses good compliance  We discussed: Benefits of medication synchronization, packaging and delivery as well as enhanced pharmacist oversight with [facility]. Patient decided to: Utilize [facility] pharmacy for medication synchronization, packaging and delivery  Care Plan and Follow Up Patient Decision:  Patient agrees to Care Plan and Follow-up.  Plan: Telephone follow up appointment with care management  team member scheduled for:  08/2020

## 2020-07-13 ENCOUNTER — Other Ambulatory Visit: Payer: Self-pay

## 2020-07-13 ENCOUNTER — Ambulatory Visit (INDEPENDENT_AMBULATORY_CARE_PROVIDER_SITE_OTHER): Payer: PPO

## 2020-07-13 DIAGNOSIS — E1169 Type 2 diabetes mellitus with other specified complication: Secondary | ICD-10-CM | POA: Diagnosis not present

## 2020-07-13 DIAGNOSIS — E1121 Type 2 diabetes mellitus with diabetic nephropathy: Secondary | ICD-10-CM | POA: Diagnosis not present

## 2020-07-13 DIAGNOSIS — E785 Hyperlipidemia, unspecified: Secondary | ICD-10-CM | POA: Diagnosis not present

## 2020-07-13 NOTE — Progress Notes (Signed)
Chronic Care Management Pharmacy Note  07/21/2020 Name:  Linda [name] MRN:  [id number] DOB:  06-18-1970   Plan recommendatons:  Patient reports that she is having increased panic attacks. She understands the risks associated with lorazepam and morphine so trying to avoid that but feels she could benefit for something additional for anxiety. Discussed option of Buspirone. Patient will discuss with Dr. Tobie Poet at visit next week. Patient states symptoms of depression are well controlled at this time.  Patient reports that she is having more issues with lack of feeling her hands and feet. She is planning to discuss any options with Dr. Tobie Poet at next visit. Patient currently on  gabapentin 300 mg tid.  Patient reports blood sugar control improving and is working to [employer] with her food.  Recommend considering change in statin dose.   Subjective: Linda [name] is an 50 y.o. year old female who is a primary patient of [name], Kirsten, MD.  The CCM team was consulted for assistance with disease management and care coordination needs.    Engaged with patient by telephone for follow up visit in response to provider referral for pharmacy case management and/or care coordination services.   Consent to Services:  The patient was given information about Chronic Care Management services, agreed to services, and gave verbal consent prior to initiation of services.  Please see initial visit note for detailed documentation.   Patient Care Team: [name], MD as PCP - General (Family Medicine) Burnice Logan, [facility] as Pharmacist (Pharmacist)  Recent office visits: 02/11/2020 - continue synjardy 12.05/998 mg daily. 6 units of Novolog before meals if sugar >150.  10/09/2019 - flu shot given. Consider reduction in diabetes medication in the future if A1C stays excellent.   Recent Consult Visit: 06/23/20 - podiatry - diabetic nail care. Measured for diabetic shoes. Toe wound is healed.  Recommended aquaphor for heel abrasion.    05/26/20-podiatry, Diabetic ulcer of toe of left foot associated with diabetes mellitus due to underlying condition, limited to breakdown of skin-   05/20/20-Oncology-Labs, prochlorperazine (COMPAZINE) 10 MG tablet,  She will proceed with antibiotic therapy per podiatry. I will order the abdominal ultrasound and we will plan to discuss these results over the phone. She will see Dr. Melina Copa in a couple of months. I will obtain iron studies and forward to Dr. Melina Copa for that appointment. I will refill her compazine today. We will see her back in 3 months for repeat CBC, CMP and clinical evaluation.    05/01/20-podiatry-wound check, Continue with PICC line antibiotics long-term for history of osteomyelitis left second toe has approximately 2 more weeks left, Continue with compression garments to assist with edema control -Patient to return to office in 2-3 weeks for follow up care and evaluation or sooner if problems arise. 02/24/2020 - oncology visit. Continuing paclitaxel.  02/21/2020 - Podiatry - debridement of second toe ulcer performed.   Hospital visits: None in previous 6 months  Objective:  Lab Results  Component Value Date   CREATININE 0.8 05/20/2020   BUN 14 05/20/2020   GFRNONAA >60 11/27/2019   GFRAA 73 10/09/2019   NA 139 05/20/2020   K 4.2 05/20/2020   CALCIUM 9.7 05/20/2020   CO2 28 (A) 05/20/2020    Lab Results  Component Value Date/Time   HGBA1C 7.4 (H) 04/23/2020 02:04 PM   HGBA1C 6.9 (H) 10/09/2019 09:47 AM   MICROALBUR 30 10/09/2019 11:56 AM   MICROALBUR 80 07/08/2019 09:51 AM  Last diabetic Eye exam:  Lab Results  Component Value Date/Time   HMDIABEYEEXA No Retinopathy 04/30/2020 12:00 AM    Last diabetic Foot exam: No results found for: HMDIABFOOTEX   Lab Results  Component Value Date   CHOL 181 10/09/2019   HDL 43 10/09/2019   LDLCALC 112 (H) 10/09/2019   TRIG 146 10/09/2019   CHOLHDL 4.2 10/09/2019     Hepatic Function Latest Ref Rng & Units 05/20/2020 04/23/2020 04/16/2020  Total Protein 6.0 - 8.5 g/dL - 6.8 6.1(A)  Albumin 3.5 - 5.0 4.5 4.2 3.5  AST 13 - 35 31 24 30   ALT 7 - 35 18 14 21   Alk Phosphatase 25 - 125 74 94 89  Total Bilirubin 0.0 - 1.2 mg/dL - 0.5 -    Lab Results  Component Value Date/Time   TSH 3.920 04/23/2020 02:04 PM   TSH 1.450 07/08/2019 12:00 AM    CBC Latest Ref Rng & Units 05/20/2020 04/23/2020 04/16/2020  WBC - 5.2 5.5 5.0  Hemoglobin 12.0 - 16.0 12.5 11.3 10.7(A)  Hematocrit 36 - 46 39 35.5 33(A)  Platelets 150 - 399 89(A) 128(L) 92(A)    Lab Results  Component Value Date/Time   VD25OH 53.8 04/23/2020 02:04 PM    Clinical ASCVD: No  The 10-year ASCVD risk score Mikey Bussing DC Jr., et al., 2013) is: 4.3%   Values used to calculate the score:     Age: 50 years     Sex: Female     Is Non-Hispanic African American: No     Diabetic: Yes     Tobacco smoker: No     Systolic Blood Pressure: 511 mmHg     Is BP treated: Yes     HDL Cholesterol: 43 mg/dL     Total Cholesterol: 181 mg/dL    Depression screen [facility] 2/9 07/21/2020 03/09/2020 10/09/2019  Decreased Interest 2 3 3   Down, Depressed, Hopeless 2 3 3   PHQ - 2 Score 4 6 6   Altered sleeping 2 3 3   Tired, decreased energy 3 3 3   Change in appetite 2 3 3   Feeling bad or failure about yourself  1 2 0  Trouble concentrating 1 2 3   Moving slowly or fidgety/restless 1 2 3   Suicidal thoughts 0 0 0  PHQ-9 Score 14 21 21   Difficult doing work/chores - - Somewhat difficult     Social History   Tobacco Use  Smoking Status Never  Smokeless Tobacco Never   BP Readings from Last 3 Encounters:  05/20/20 131/65  04/23/20 138/60  04/16/20 130/80   Pulse Readings from Last 3 Encounters:  05/20/20 92  04/23/20 80  04/16/20 (!) 105   Wt Readings from Last 3 Encounters:  05/20/20 182 lb 11.2 oz (82.9 kg)  04/23/20 184 lb (83.5 kg)  04/16/20 212 lb 11.2 oz (96.5 kg)    Assessment/Interventions: Review  of patient past medical history, allergies, medications, health status, including review of consultants reports, laboratory and other test data, was performed as part of comprehensive evaluation and provision of chronic care management services.   SDOH:  (Social Determinants of Health) assessments and interventions performed: Yes   CCM Care Plan  Allergies  Allergen Reactions   Codeine Shortness Of Breath    Other reaction(s): SHOB   Celecoxib Other (See Comments)    Unknown reaction Other reaction(s): Unknown   Ezetimibe     Other reaction(s): Unknown   Ezetimibe-Simvastatin Other (See Comments)    Unknown reaction   Propranolol  Other reaction(s): Unknown   Propranolol Hcl Other (See Comments)    Unknown reaction   Simvastatin     Other reaction(s): Unknown    Medications Reviewed Today     Reviewed by Burnice Logan, [facility] (Pharmacist) on 07/13/20 at [place] List Status: <None>   Medication Order Taking? Sig Documenting Provider Last Dose Status Informant  Blood Glucose Monitoring Suppl (ONETOUCH VERIO REFLECT) w/Device KIT [id number] Yes AS DIRECTED [provider] Taking Active   buPROPion (WELLBUTRIN XL) 300 MG 24 hr tablet [id number] Yes Take 1 tablet (300 mg total) by mouth every evening. [name], Kirsten, MD Taking Active   calcium citrate-vitamin D (CITRACAL+D) 315-200 MG-UNIT tablet [id number] Yes Take 1 tablet by mouth 2 (two) times daily. [provider] Taking Active   clotrimazole-betamethasone Donalynn Furlong) cream [id number] Yes Apply small amount to affected area twice daily [name], Kirsten, MD Taking Active   Continuous Blood Gluc Receiver (FREESTYLE LIBRE 2 READER) DEVI [id number] Yes E11.69 Check blood sugar 4 times daily as directed [name], Kirsten, MD Taking Active   Continuous Blood Gluc Sensor (FREESTYLE LIBRE 2 SENSOR) [place] [id number] Yes E11.69 Change sensor every 14 days as directed [name], Elnita Maxwell, MD Taking Active   cyclobenzaprine (FLEXERIL) 10 MG  tablet [id number]  TAKE ONE TABLET BY MOUTH EVERY 8 HOURS AS NEEDED FOR MUSCLE SPASMS [place] Scrape A, NP  Active   dicyclomine (BENTYL) 20 MG tablet [id number] Yes TAKE ONE TABLET BY MOUTH BEFORE MEALS AND AT BEDTIME AS NEEDED FOR STOMACH CRAMPING [name], Elnita Maxwell, MD Taking Active   Dulaglutide (TRULICITY) 9.79 YI/0.1KP [place] [id number] Yes INJECT ONE SYRINGE ONCE A WEEK [name], Kirsten, MD Taking Active   Empagliflozin-metFORMIN HCl [facility]) 12.05-998 MG TABS [id number] Yes Take 1 tablet by mouth 2 (two) times daily. [name], Kirsten, MD Taking Active   famotidine (PEPCID) 20 MG tablet [id number] Yes Take 1 tablet (20 mg total) by mouth 2 (two) times daily. [name], Kirsten, MD Taking Active   ferrous sulfate 325 (65 FE) MG tablet [id number] Yes Take 325 mg by mouth every evening. [provider] Taking Active   gabapentin (NEURONTIN) 300 MG capsule [id number] Yes TAKE ONE CAPSULE BY MOUTH THREE TIMES DAILY [name], Kirsten, MD Taking Active   Levomilnacipran HCl ER ([place]) 80 MG CP24 [id number] Yes Take 1 capsule by mouth daily. [name], Kirsten, MD Taking Active   levothyroxine (SYNTHROID) 75 MCG tablet [id number] Yes Take 1 tablet (75 mcg total) by mouth daily. [name], Kirsten, MD Taking Active   loratadine (CLARITIN) 10 MG tablet [id number] Yes Take 10 mg by mouth daily. Taking 3-4 days each week due to bone pain associated with shot from cancer center. [provider] Taking Active   LORazepam (ATIVAN) 0.5 MG tablet [id number] Yes TAKE ONE TABLET BY MOUTH TWICE DAILY AS NEEDED FOR ANXIETY [name], Kirsten, MD Taking Active   losartan (COZAAR) 50 MG tablet [id number] Yes Take 1 tablet (50 mg total) by mouth every evening. [name], MD Taking Active   Magnesium 500 MG CAPS [id number] Yes 500 mg. [provider] Taking Active   morphine (MS CONTIN) 30 MG 12 hr tablet [id number] Yes TAKE ONE TABLET BY MOUTH EVERY TWELVE HOURS [name], Kirsten, MD Taking Active   Multiple Vitamin (MULTIVITAMIN WITH MINERALS)  TABS tablet [id number] Yes Take 1 tablet by mouth daily.  [provider] Taking Active Self           Med Note Orvan Seen, Sharlette Dense   Fri Jun 08, 2018  9:21 PM) Pt plans  on purchasing again when she can get out.  ondansetron (ZOFRAN) 4 MG tablet [id number] Yes TAKE ONE TABLET BY MOUTH EVERY 4 HOURS AS NEEDED FOR NAUSEA [place] Scrape A, NP Taking Active   OneTouch Delica Lancets 75F MISC [id number] Yes 1 each by Does not apply route daily before breakfast. Check blood sugar twice daily. [name], Kirsten, MD Taking Active   pantoprazole (PROTONIX) 40 MG tablet [id number] Yes Take 1 tablet (40 mg total) by mouth 2 (two) times daily. [name], MD Taking Active   Pegfilgrastim-jmdb [facility]) [id number]  Inject into the skin. [provider]  Active   potassium chloride (KLOR-CON) 20 MEQ packet [id number] No 20 mEq in the morning and at bedtime.  Patient not taking: No sig reported   [provider] Not Taking Active   potassium chloride (MICRO-K) 10 MEQ CR capsule [id number]  TAKE TWO CAPSULES BY MOUTH TWICE DAILY [name], Kirsten, MD  Active   Potassium Chloride ER 20 MEQ TBCR [id number] Yes Take 1 tablet by mouth daily. [name], Kirsten, MD Taking Active   pravastatin (PRAVACHOL) 20 MG tablet [id number] Yes Take 1 tablet (20 mg total) by mouth at bedtime. [name], Kirsten, MD Taking Active   prochlorperazine (COMPAZINE) 10 MG tablet [id number] No Take 1 tablet (10 mg total) by mouth every 6 (six) hours as needed for nausea or vomiting.  Patient not taking: Reported on 07/13/2020   Melodye Ped, NP Not Taking Active   prochlorperazine (COMPAZINE) 10 MG tablet [id number] Yes Take 1 tablet (10 mg total) by mouth every 6 (six) hours as needed for nausea or vomiting. [place] Scrape A, NP Taking Active   rOPINIRole (REQUIP) 0.25 MG tablet [id number] Yes TAKE ONE TABLET BY MOUTH EVERYDAY AT BEDTIME [name], Kirsten, MD Taking Active   senna (SENOKOT) 8.6 MG tablet [id number] Yes Take 1 tablet by  mouth daily as needed for constipation. [provider] Taking Active   SODIUM FLUORIDE 5000 SENSITIVE 1.1-5 % GEL [id number] Yes Take by mouth 2 (two) times daily. [provider] Taking Active   Specialty Vitamins Products (MAGNESIUM, AMINO ACID CHELATE,) 133 MG tablet [id number] Yes Take 1 tablet by mouth at bedtime. [provider] Taking Active   sucralfate (CARAFATE) 1 g tablet [id number] Yes Take 1 tablet (1 g total) by mouth 4 (four) times daily -  with meals and at bedtime. 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 04/17/2020   Osteomyelitis ([place]) 04/17/2020   Diabetic ulcer of toe of left foot associated with type 2 diabetes mellitus, with fat layer exposed ([place]) 03/27/2020   Dehydration 03/16/2020   Hypokalemia 03/09/2020   Chemotherapy-induced thrombocytopenia 02/06/2020   Thrombocytopenia, unspecified ([place]) 01/27/2020   Malignant neoplasm of lower-inner quadrant of right breast of female, estrogen receptor negative ([place]) 10/01/2019   Chronic pain syndrome 07/08/2019   Memory loss 07/08/2019   Depression, major, recurrent, mild ([place]) 78/67/5449   Uncomplicated opioid dependence ([place]) 07/08/2019   Mixed hyperlipidemia 04/11/2019  Dyslipidemia associated with type 2 diabetes mellitus ([place]) 04/11/2019   Essential hypertension, benign 04/11/2019   Major depressive disorder, single episode, mild ([place]) 04/11/2019   Acquired hypothyroidism 04/11/2019   Vitamin D  insufficiency 04/11/2019   Class 2 severe obesity due to excess calories with serious comorbidity and body mass index (BMI) of 35.0 to 35.9 in adult ([place]) 04/11/2019   Pre-ulcerative calluses 03/04/2019   Liver cirrhosis secondary to NASH (nonalcoholic steatohepatitis) ([place]) 09/13/2018    Class: Chronic   BRCA1 positive 06/14/2016    Immunization History  Administered Date(s) Administered   Hepatitis B 07/10/2019   Hepatitis B, adult 03/11/2020   Influenza Inj Mdck Quad Pf 10/09/2019   Influenza-Unspecified 05/05/2013, 09/14/2018   PFIZER(Purple Top)SARS-COV-2 Vaccination 04/18/2019, 05/13/2019, 01/24/2020   Pneumococcal Polysaccharide-23 04/27/2012, 05/05/2013    Conditions to be addressed/monitored:  Hypertension, Hyperlipidemia, Diabetes, Hypothyroidism and Depression  Care Plan : [place]  Updates made by Burnice Logan, [place] since 07/21/2020 12:00 AM     Problem: diabetes, hyperlipidemia, hypertension   Priority: High  Onset Date: 03/05/2020     Long-Range Goal: Disease Management   Start Date: 03/05/2020  Expected End Date: 03/05/2021  Recent Progress: On track  Priority: High  Note:   Current Barriers:  Unable to maintain control of blood sugar while on steroids/chemotherapy.    Pharmacist Clinical Goal(s):  Over the next 90 days, patient will achieve control of diabetes as evidenced by blood sugar and a1c through collaboration with PharmD and provider.   Interventions: 1:1 collaboration with [name], Elnita Maxwell, MD regarding development and update of comprehensive plan of care as evidenced by provider attestation and co-signature Inter-disciplinary care team collaboration (see longitudinal plan of care) Comprehensive medication review performed; medication list updated in electronic medical record  Hypertension (BP goal <130/80) -controlled -Current treatment: losartan 50 mg daily  -Medications previously tried: none reported  -Current home readings:  well controlled when checked at home or office -Current dietary habits: eating what family prepares while on chemotherapy -Current exercise habits: chemotherapy limits exercise and energy -Denies hypotensive/hypertensive symptoms -Educated on BP goals and benefits of medications for prevention of heart attack, stroke and kidney damage; Daily salt intake goal < 2300 mg; -Counseled to monitor BP at home as needed, document, and provide log at future appointments -Counseled on diet and exercise extensively Recommended to continue current medication  Hyperlipidemia: (LDL goal < 70) -uncontrolled  -Current treatment: Pravastatin 40 mg daily  -Medications previously tried: none reported  -Current dietary patterns: eating what family provides during chemotherapy -Current exercise habits: limited due to chemo -Educated on Cholesterol goals;  Importance of limiting foods high in cholesterol; Exercise goal of 150 minutes per week; -Counseled on diet and exercise extensively  Diabetes (A1c goal <7%) -uncontrolled -Current medications: Trulicity 6.62 mg weekly  Synjardy 12.05/998 mg twice daily   -Medications previously tried: none reported  -Current home glucose readings fasting glucose: improved since stopping insulin. Occasionally Libre and fingerstick don't match. 90-low 100s.  post prandial glucose: 250  -Reports hypoglycemic symptoms -Current meal patterns:  Diet: Patient working on [employer].  -Current exercise: limited due to chemotherapy -Educated onA1c and blood sugar goals; Exercise goal of 150 minutes per week; Benefits of routine self-monitoring of blood sugar; -Counseled to check feet daily and get yearly eye exams -Counseled on diet and exercise extensively  -Recommend continuing to work on [employer].   Depression/Anxiety (Goal: manage symptoms) -uncontrolled -Current treatment: Wellbutrin XL 300 mg daily Fetzima  80 mg  Lorazepam 0.5 mg bid prn  anxiety -Medications previously tried/failed: none reported -PHQ9: 14 -GAD7: 13 -Educated on Benefits of cognitive-behavioral therapy with or without medication -Recommended discussing medication options with Dr. Tobie Poet. Patient reports anxiety and panic attacks are increased lately. She feels like depression is better.    Patient Goals/Self-Care Activities Over the next 90 days, patient will:  - take medications as prescribed check glucose three times daily , document, and provide at future appointments  Follow Up Plan: Telephone follow up appointment with care management team member scheduled for: 09/2020       Medication Assistance: None required.  Patient affirms current coverage meets needs.  Patient's preferred pharmacy is:  [facility] - [place], [place] - [address] [place] [address] Phone: [phone] Fax: [phone]  [facility] (New Address) - [place], [place] AT Previously: [place] [place], [address] [place] [address] Phone: [phone] Fax: [place], [address] [place] [address] Phone: [phone] Fax: [phone]  Uses pill box? Yes Pt endorses good compliance  We discussed: Benefits of medication synchronization, packaging and delivery as well as enhanced pharmacist oversight with [facility]. Patient decided to: Utilize [facility] pharmacy for medication synchronization, packaging and delivery  Care Plan and Follow Up Patient Decision:  Patient agrees to Care Plan and Follow-up.  Plan: Telephone follow up appointment with care management team member scheduled for:  09/2020

## 2020-07-21 NOTE — Patient Instructions (Signed)
Visit Information   Goals Addressed             This Visit's Progress    Manage My Medicine   On track    Timeframe:  Long-Range Goal Priority:  High Start Date:         03/05/2020                    Expected End Date:           03/05/2021            Follow Up Date 06/02/2020    - call for medicine refill 2 or 3 days before it runs out - keep a list of all the medicines I take; vitamins and herbals too - use a pillbox to sort medicine - use an alarm clock or phone to remind me to take my medicine    Why is this important?   These steps will help you keep on track with your medicines.   Notes:       Monitor and Manage My Blood Sugar-Diabetes Type 2   On track    Timeframe:  Long-Range Goal Priority:  High Start Date:              03/05/2020               Expected End Date:                03/05/2021       Follow Up Date 06/02/2020    - check blood sugar at prescribed times - check blood sugar if I feel it is too high or too low - take the blood sugar log to all doctor visits    Why is this important?   Checking your blood sugar at home helps to keep it from getting very high or very low.  Writing the results in a diary or log helps the doctor know how to care for you.  Your blood sugar log should have the time, date and the results.  Also, write down the amount of insulin or other medicine that you take.  Other information, like what you ate, exercise done and how you were feeling, will also be helpful.     Notes:       Track and Manage My Symptoms-Depression   On track    Timeframe:  Long-Range Goal Priority:  High Start Date:       03/05/2020                      Expected End Date:         03/05/2021              Follow Up Date 06/02/2020    - avoid negative self-talk - develop a plan to deal with triggers like holidays, anniversaries - have a plan for how to handle bad days    Why is this important?   Keeping track of your progress will help your  treatment team find the right mix of medicine and therapy for you.  Write in your journal every day.  Day-to-day changes in depression symptoms are normal. It may be more helpful to check your progress at the end of each week instead of every day.     Notes:         Patient Care Plan: CCM Pharmacy Care Plan     Problem Identified: diabetes, hyperlipidemia, hypertension   Priority: High  Onset  Date: 03/05/2020     Long-Range Goal: Disease Management   Start Date: 03/05/2020  Expected End Date: 03/05/2021  Recent Progress: On track  Priority: High  Note:   Current Barriers:  Unable to maintain control of blood sugar while on steroids/chemotherapy.    Pharmacist Clinical Goal(s):  Over the next 90 days, patient will achieve control of diabetes as evidenced by blood sugar and a1c through collaboration with PharmD and provider.   Interventions: 1:1 collaboration with [name], Elnita Maxwell, MD regarding development and update of comprehensive plan of care as evidenced by provider attestation and co-signature Inter-disciplinary care team collaboration (see longitudinal plan of care) Comprehensive medication review performed; medication list updated in electronic medical record  Hypertension (BP goal <130/80) -controlled -Current treatment: losartan 50 mg daily  -Medications previously tried: none reported  -Current home readings: well controlled when checked at home or office -Current dietary habits: eating what family prepares while on chemotherapy -Current exercise habits: chemotherapy limits exercise and energy -Denies hypotensive/hypertensive symptoms -Educated on BP goals and benefits of medications for prevention of heart attack, stroke and kidney damage; Daily salt intake goal < 2300 mg; -Counseled to monitor BP at home as needed, document, and provide log at future appointments -Counseled on diet and exercise extensively Recommended to continue current  medication  Hyperlipidemia: (LDL goal < 70) -uncontrolled  -Current treatment: Pravastatin 40 mg daily  -Medications previously tried: none reported  -Current dietary patterns: eating what family provides during chemotherapy -Current exercise habits: limited due to chemo -Educated on Cholesterol goals;  Importance of limiting foods high in cholesterol; Exercise goal of 150 minutes per week; -Counseled on diet and exercise extensively  Diabetes (A1c goal <7%) -uncontrolled -Current medications: Trulicity 4.15 mg weekly  Synjardy 12.05/998 mg twice daily   -Medications previously tried: none reported  -Current home glucose readings fasting glucose: improved since stopping insulin. Occasionally Libre and fingerstick don't match. 90-low 100s.  post prandial glucose: 250  -Reports hypoglycemic symptoms -Current meal patterns:  Diet: Patient working on [employer].  -Current exercise: limited due to chemotherapy -Educated onA1c and blood sugar goals; Exercise goal of 150 minutes per week; Benefits of routine self-monitoring of blood sugar; -Counseled to check feet daily and get yearly eye exams -Counseled on diet and exercise extensively  -Recommend continuing to work on [employer].   Depression/Anxiety (Goal: manage symptoms) -uncontrolled -Current treatment: Wellbutrin XL 300 mg daily Fetzima 80 mg  Lorazepam 0.5 mg bid prn anxiety -Medications previously tried/failed: none reported -PHQ9: 21 -GAD7: 16 -Educated on Benefits of cognitive-behavioral therapy with or without medication -Recommended discussing medication options with Dr. Tobie Poet. Patient reports anxiety and panic attacks are increased lately. She feels like depression is better.    Patient Goals/Self-Care Activities Over the next 90 days, patient will:  - take medications as prescribed check glucose three times daily , document, and provide at future appointments  Follow Up Plan: Telephone  follow up appointment with care management team member scheduled for: 09/2020      Patient verbalizes understanding of instructions provided today and agrees to view in [place].  Telephone follow up appointment with pharmacy team member scheduled for: 09/2020 with pharmacist  Burnice Logan, [facility]

## 2020-07-23 ENCOUNTER — Other Ambulatory Visit: Payer: Self-pay

## 2020-07-23 ENCOUNTER — Telehealth: Payer: Self-pay

## 2020-07-23 MED ORDER — MORPHINE SULFATE ER 30 MG PO TBCR: 30.0000 mg | EXTENDED_RELEASE_TABLET | Freq: Two times a day (BID) | ORAL | 0 refills | Status: DC

## 2020-07-23 NOTE — Progress Notes (Signed)
Chronic Care Management Pharmacy Assistant   Name: Linda [name]  MRN: [id number] DOB: 02-Mar-1970   Reason for Encounter: Medication coordination for [facility]  Recent office visits:  07/13/20-Linda [name], CCM visit, Patient reports that she is having increased panic attacks. She understands the risks associated with lorazepam and morphine so trying to avoid that but feels she could benefit for something additional for anxiety. Discussed option of Buspirone. Patient will discuss with Linda [name] at visit next week. Patient states symptoms of depression are well controlled at this time. Patient reports that she is having more issues with lack of feeling her hands and feet. She is planning to discuss any options with Linda [name] at next visit. Patient currently on  gabapentin 300 mg tid. Patient reports blood sugar control improving and is working to [employer] with her food.  Recommend considering change in statin dose, she will discuss with Linda [name].   Recent consult visits:  06/23/20-Linda [name] DPM, toe ulcer of left foot, no medication changes  Hospital visits:  04/08/20 ED visit- No admission  Medications: Outpatient Encounter Medications as of 07/23/2020  Medication Sig Note   Blood Glucose Monitoring Suppl (ONETOUCH VERIO REFLECT) w/Device KIT AS DIRECTED    buPROPion (WELLBUTRIN XL) 300 MG 24 hr tablet Take 1 tablet (300 mg total) by mouth every evening.    calcium citrate-vitamin D (CITRACAL+D) 315-200 MG-UNIT tablet Take 1 tablet by mouth 2 (two) times daily.    clotrimazole-betamethasone (LOTRISONE) cream Apply small amount to affected area twice daily    Continuous Blood Gluc Receiver (FREESTYLE LIBRE 2 READER) DEVI E11.69 Check blood sugar 4 times daily as directed    Continuous Blood Gluc Sensor (FREESTYLE LIBRE 2 SENSOR) MISC E11.69 Change sensor every 14 days as directed    cyclobenzaprine (FLEXERIL) 10 MG tablet TAKE ONE TABLET BY MOUTH EVERY 8 HOURS AS  NEEDED FOR MUSCLE SPASMS    dicyclomine (BENTYL) 20 MG tablet TAKE ONE TABLET BY MOUTH BEFORE MEALS AND AT BEDTIME AS NEEDED FOR STOMACH CRAMPING    Dulaglutide (TRULICITY) 0.86 VH/8.4ON SOPN INJECT ONE SYRINGE ONCE A WEEK    Empagliflozin-metFORMIN HCl (SYNJARDY) 12.05-998 MG TABS Take 1 tablet by mouth 2 (two) times daily.    famotidine (PEPCID) 20 MG tablet Take 1 tablet (20 mg total) by mouth 2 (two) times daily.    ferrous sulfate 325 (65 FE) MG tablet Take 325 mg by mouth every evening.    gabapentin (NEURONTIN) 300 MG capsule TAKE ONE CAPSULE BY MOUTH THREE TIMES DAILY    Levomilnacipran HCl ER (FETZIMA) 80 MG CP24 Take 1 capsule by mouth daily.    levothyroxine (SYNTHROID) 75 MCG tablet Take 1 tablet (75 mcg total) by mouth daily.    loratadine (CLARITIN) 10 MG tablet Take 10 mg by mouth daily. Taking 3-4 days each week due to bone pain associated with shot from cancer center.    LORazepam (ATIVAN) 0.5 MG tablet TAKE ONE TABLET BY MOUTH TWICE DAILY AS NEEDED FOR ANXIETY    losartan (COZAAR) 50 MG tablet Take 1 tablet (50 mg total) by mouth every evening.    Magnesium 500 MG CAPS 500 mg.    morphine (MS CONTIN) 30 MG 12 hr tablet TAKE ONE TABLET BY MOUTH EVERY TWELVE HOURS    Multiple Vitamin (MULTIVITAMIN WITH MINERALS) TABS tablet Take 1 tablet by mouth daily.  06/08/2018: Pt plans on purchasing again when she can get out.   ondansetron (ZOFRAN) 4 MG tablet TAKE  ONE TABLET BY MOUTH EVERY 4 HOURS AS NEEDED FOR NAUSEA    OneTouch Delica Lancets 35T MISC 1 each by Does not apply route daily before breakfast. Check blood sugar twice daily.    pantoprazole (PROTONIX) 40 MG tablet Take 1 tablet (40 mg total) by mouth 2 (two) times daily.    Pegfilgrastim-jmdb (FULPHILA [name]) Inject into the skin.    potassium chloride (KLOR-CON) 20 MEQ packet 20 mEq in the morning and at bedtime. (Patient not taking: No sig reported)    potassium chloride (MICRO-K) 10 MEQ CR capsule TAKE TWO CAPSULES BY MOUTH  TWICE DAILY    Potassium Chloride ER 20 MEQ TBCR Take 1 tablet by mouth daily.    pravastatin (PRAVACHOL) 20 MG tablet Take 1 tablet (20 mg total) by mouth at bedtime.    prochlorperazine (COMPAZINE) 10 MG tablet Take 1 tablet (10 mg total) by mouth every 6 (six) hours as needed for nausea or vomiting. (Patient not taking: Reported on 07/13/2020)    prochlorperazine (COMPAZINE) 10 MG tablet Take 1 tablet (10 mg total) by mouth every 6 (six) hours as needed for nausea or vomiting.    rOPINIRole (REQUIP) 0.25 MG tablet TAKE ONE TABLET BY MOUTH EVERYDAY AT BEDTIME    senna (SENOKOT) 8.6 MG tablet Take 1 tablet by mouth daily as needed for constipation.    SODIUM FLUORIDE 5000 SENSITIVE 1.1-5 % GEL Take by mouth 2 (two) times daily.    Specialty Vitamins Products (MAGNESIUM, AMINO ACID CHELATE,) 133 MG tablet Take 1 tablet by mouth at bedtime.    sucralfate (CARAFATE) 1 g tablet Take 1 tablet (1 g total) by mouth 4 (four) times daily -  with meals and at bedtime.    Vitamin D, Ergocalciferol, (DRISDOL) 1.25 MG (50000 UNIT) CAPS capsule TAKE ONE CAPSULE BY MOUTH ONCE WEEKLY ON FRIDAY    zolpidem (AMBIEN) 10 MG tablet TAKE ONE TABLET BY MOUTH EVERYDAY AT BEDTIME AS NEEDED    No facility-administered encounter medications on file as of 07/23/2020.   Reviewed chart for medication changes ahead of medication coordination call.  No OVs, Consults, or hospital visits since last care coordination call/Pharmacist visit. (If appropriate, list visit date, provider name)  No medication changes indicated OR if recent visit, treatment plan here.  BP Readings from Last 3 Encounters:  05/20/20 131/65  04/23/20 138/60  04/16/20 130/80    Lab Results  Component Value Date   HGBA1C 7.4 (H) 04/23/2020     Patient obtains medications through Vials  30 Days   Last delivery date was 07/02/20, delivery included Ropinirole - 0.25 mg- 1 tablet daily (bedtime) Synjardy- 12.5-1,000 mg - 1 tablet twice  daily Pantoprazole -40 mg 1 tablet twice daily (1-before breakfast and 1- evening meal) Zolpidem- 10 mg- 1 tablet daily (bedtime) Levothyroxine 75 mcg- 1 tablet daily Gabapentin 300 mg -1 capsule 3 times daily Losartan 50 mg- 1 tablet daily (evening meal) Bupropion XL 300 mg -1 tablet daily (bedtime) Famotidine 20 mg- 1 tablet twice daily Fetzima. ER 80 mg- 1 capsule daily Pravastatin 20 mg- 1 tablet daily (bedtime) Potassium CL ER 20 meq- 2 caps twice daily *should be in capsule form Morphine ER 30 mg-  1 tablet every 12 hours Vitamin D 50,000 units- One capsule weekly (Friday) Freestyle kit 2 Sensor- Use as directed and change every 14 days.    Patient declined (meds) last month due to PRN use/additional supply on hand. Polyethylene glycol 3350- Dissolve 17 grams (one capful) and drink as  directed once daily- Has 2 full containers on hand. Lantus solostar- inject 30 units daily (bedtime)- Linda [name] discontinued Prochlorperazine - 10 mg- 1 tablet every 6 hours PRN nausea and vomiting- Has enough on hand Ondansetron 4 mg- 1 tablet every 4 hours prn nauesea- Has enough on hand Trulicity 6.57/8.4 -Inject 0.75 mg into skin once a week- has 1 months worth on hand  Patient is due for next adherence delivery on: 08/03/20. Called patient and reviewed medications and coordinated delivery.    This delivery to include: Ropinirole - 0.25 mg- 1 tablet daily (bedtime) Synjardy- 12.5-1,000 mg - 1 tablet twice daily Pantoprazole -40 mg 1 tablet twice daily (1-before breakfast and 1- evening meal) Zolpidem- 10 mg- 1 tablet daily (bedtime) Levothyroxine 75 mcg- 1 tablet daily Gabapentin 300 mg -1 capsule 3 times daily Losartan 50 mg- 1 tablet daily (evening meal) Bupropion XL 300 mg -1 tablet daily (bedtime) Famotidine 20 mg- 1 tablet twice daily Fetzima. ER 80 mg- 1 capsule daily Pravastatin 20 mg- 1 tablet daily (bedtime) Potassium CL ER 20 meq- 2 caps twice daily *should be in capsule  form Morphine ER 30 mg-  1 tablet every 12 hours Vitamin D 50,000 units- One capsule weekly (Friday) Freestyle kit 2 Sensor- Use as directed and change every 14 days.   Patient declined the following medications (meds) due to (reason) Polyethylene glycol 3350- Dissolve 17 grams (one capful) and drink as directed once daily- Has 2 full containers on hand. Lantus solostar- inject 30 units daily (bedtime)- Linda [name] discontinued Prochlorperazine - 10 mg- 1 tablet every 6 hours PRN nausea and vomiting- Has enough on hand Ondansetron 4 mg- 1 tablet every 4 hours prn nauesea- Has enough on hand  Acute fill for 07/02/60 done for Trulicity  Patient needs refills for . Morphine  Confirmed delivery date of 08/03/20, advised patient that pharmacy will contact them the morning of delivery.   Clarita Leber, Linda Pharmacist Assistant [phone]

## 2020-07-24 ENCOUNTER — Other Ambulatory Visit: Payer: Self-pay

## 2020-07-24 ENCOUNTER — Encounter: Payer: Self-pay | Admitting: Family Medicine

## 2020-07-24 ENCOUNTER — Ambulatory Visit (INDEPENDENT_AMBULATORY_CARE_PROVIDER_SITE_OTHER): Payer: PPO | Admitting: Family Medicine

## 2020-07-24 ENCOUNTER — Ambulatory Visit (INDEPENDENT_AMBULATORY_CARE_PROVIDER_SITE_OTHER): Payer: PPO

## 2020-07-24 VITALS — BP 110/70 | HR 104 | Temp 98.0°F | Resp 18 | Ht 62.0 in | Wt 183.0 lb

## 2020-07-24 DIAGNOSIS — E559 Vitamin D deficiency, unspecified: Secondary | ICD-10-CM

## 2020-07-24 DIAGNOSIS — D693 Immune thrombocytopenic purpura: Secondary | ICD-10-CM | POA: Diagnosis not present

## 2020-07-24 DIAGNOSIS — E1121 Type 2 diabetes mellitus with diabetic nephropathy: Secondary | ICD-10-CM

## 2020-07-24 DIAGNOSIS — E039 Hypothyroidism, unspecified: Secondary | ICD-10-CM | POA: Diagnosis not present

## 2020-07-24 DIAGNOSIS — G894 Chronic pain syndrome: Secondary | ICD-10-CM

## 2020-07-24 DIAGNOSIS — F1120 Opioid dependence, uncomplicated: Secondary | ICD-10-CM

## 2020-07-24 DIAGNOSIS — Z1509 Genetic susceptibility to other malignant neoplasm: Secondary | ICD-10-CM | POA: Diagnosis not present

## 2020-07-24 DIAGNOSIS — N1832 Chronic kidney disease, stage 3b: Secondary | ICD-10-CM

## 2020-07-24 DIAGNOSIS — Z1501 Genetic susceptibility to malignant neoplasm of breast: Secondary | ICD-10-CM | POA: Diagnosis not present

## 2020-07-24 DIAGNOSIS — I129 Hypertensive chronic kidney disease with stage 1 through stage 4 chronic kidney disease, or unspecified chronic kidney disease: Secondary | ICD-10-CM

## 2020-07-24 DIAGNOSIS — Z23 Encounter for immunization: Secondary | ICD-10-CM

## 2020-07-24 DIAGNOSIS — E782 Mixed hyperlipidemia: Secondary | ICD-10-CM | POA: Diagnosis not present

## 2020-07-24 MED ORDER — TRULICITY 1.5 MG/0.5ML ~~LOC~~ SOAJ: 1.5000 mg | SUBCUTANEOUS | 2 refills | Status: DC

## 2020-07-24 NOTE — Patient Instructions (Signed)
Increase trulicity to 1.5 mg weekly.  Work on eating healthy.

## 2020-07-25 LAB — COMPREHENSIVE METABOLIC PANEL
ALT: 20 IU/L (ref 0–32)
AST: 16 IU/L (ref 0–40)
Albumin/Globulin Ratio: 2.3 — ABNORMAL HIGH (ref 1.2–2.2)
Albumin: 4.3 g/dL (ref 3.8–4.9)
Alkaline Phosphatase: 76 IU/L (ref 44–121)
BUN/Creatinine Ratio: 15 (ref 9–23)
BUN: 15 mg/dL (ref 6–24)
Bilirubin Total: 0.5 mg/dL (ref 0.0–1.2)
CO2: 26 mmol/L (ref 20–29)
Calcium: 9.4 mg/dL (ref 8.7–10.2)
Chloride: 98 mmol/L (ref 96–106)
Creatinine, Ser: 0.99 mg/dL (ref 0.57–1.00)
Globulin, Total: 1.9 g/dL (ref 1.5–4.5)
Glucose: 114 mg/dL — ABNORMAL HIGH (ref 65–99)
Potassium: 4 mmol/L (ref 3.5–5.2)
Sodium: 140 mmol/L (ref 134–144)
Total Protein: 6.2 g/dL (ref 6.0–8.5)
eGFR: 69 mL/min/{1.73_m2} (ref >59)

## 2020-07-25 LAB — CBC WITH DIFFERENTIAL/PLATELET
Basophils Absolute: 0 10*3/uL (ref 0.0–0.2)
Basos: 1 %
EOS (ABSOLUTE): 0.1 10*3/uL (ref 0.0–0.4)
Eos: 2 %
Hematocrit: 37.6 % (ref 34.0–46.6)
Hemoglobin: 12.2 g/dL (ref 11.1–15.9)
Immature Grans (Abs): 0 10*3/uL (ref 0.0–0.1)
Immature Granulocytes: 0 %
Lymphocytes Absolute: 1.3 10*3/uL (ref 0.7–3.1)
Lymphs: 22 %
MCH: 28.5 pg (ref 26.6–33.0)
MCHC: 32.4 g/dL (ref 31.5–35.7)
MCV: 88 fL (ref 79–97)
Monocytes Absolute: 0.5 10*3/uL (ref 0.1–0.9)
Monocytes: 8 %
Neutrophils Absolute: 4 10*3/uL (ref 1.4–7.0)
Neutrophils: 67 %
Platelets: 89 10*3/uL — CL (ref 150–450)
RBC: 4.28 x10E6/uL (ref 3.77–5.28)
RDW: 13.6 % (ref 11.7–15.4)
WBC: 5.9 10*3/uL (ref 3.4–10.8)

## 2020-07-25 LAB — TSH: TSH: 1.44 u[IU]/mL (ref 0.450–4.500)

## 2020-07-25 LAB — PAIN MGT SCRN (14 DRUGS), UR
Amphetamine Scrn, Ur: NEGATIVE ng/mL
BARBITURATE SCREEN URINE: NEGATIVE ng/mL
BENZODIAZEPINE SCREEN, URINE: NEGATIVE ng/mL
Buprenorphine, Urine: NEGATIVE ng/mL
CANNABINOIDS UR QL SCN: NEGATIVE ng/mL
Cocaine (Metab) Scrn, Ur: NEGATIVE ng/mL
Creatinine(Crt), U: 127 mg/dL (ref 20.0–300.0)
Fentanyl, Urine: NEGATIVE pg/mL
Meperidine Screen, Urine: NEGATIVE ng/mL
Methadone Screen, Urine: NEGATIVE ng/mL
OXYCODONE+OXYMORPHONE UR QL SCN: NEGATIVE ng/mL
Opiate Scrn, Ur: POSITIVE ng/mL — AB
Ph of Urine: 5.4 (ref 4.5–8.9)
Phencyclidine Qn, Ur: NEGATIVE ng/mL
Propoxyphene Scrn, Ur: NEGATIVE ng/mL
Tramadol Screen, Urine: NEGATIVE ng/mL

## 2020-07-25 LAB — HEMOGLOBIN A1C
Est. average glucose Bld gHb Est-mCnc: 120 mg/dL
Hgb A1c MFr Bld: 5.8 % — ABNORMAL HIGH (ref 4.8–5.6)

## 2020-07-25 LAB — LIPID PANEL
Chol/HDL Ratio: 2.9 ratio (ref 0.0–4.4)
Cholesterol, Total: 165 mg/dL (ref 100–199)
HDL: 57 mg/dL (ref >39)
LDL Chol Calc (NIH): 86 mg/dL (ref 0–99)
Triglycerides: 126 mg/dL (ref 0–149)
VLDL Cholesterol Cal: 22 mg/dL (ref 5–40)

## 2020-07-25 LAB — CARDIOVASCULAR RISK ASSESSMENT

## 2020-07-28 ENCOUNTER — Other Ambulatory Visit: Payer: Self-pay

## 2020-07-28 DIAGNOSIS — D696 Thrombocytopenia, unspecified: Secondary | ICD-10-CM

## 2020-07-29 MED ORDER — MORPHINE SULFATE ER 30 MG PO TBCR: 30.0000 mg | EXTENDED_RELEASE_TABLET | Freq: Two times a day (BID) | ORAL | 0 refills | Status: DC

## 2020-07-31 IMAGING — DX CHEST - 2 VIEW
2 series · 2 of 2 positions shown · non-contrast
Comparison: Chest radiograph April 02, 2004 and chest CT December 17, 2013

CLINICAL DATA: Chest pain

EXAM:
CHEST - 2 VIEW

[chest pa]
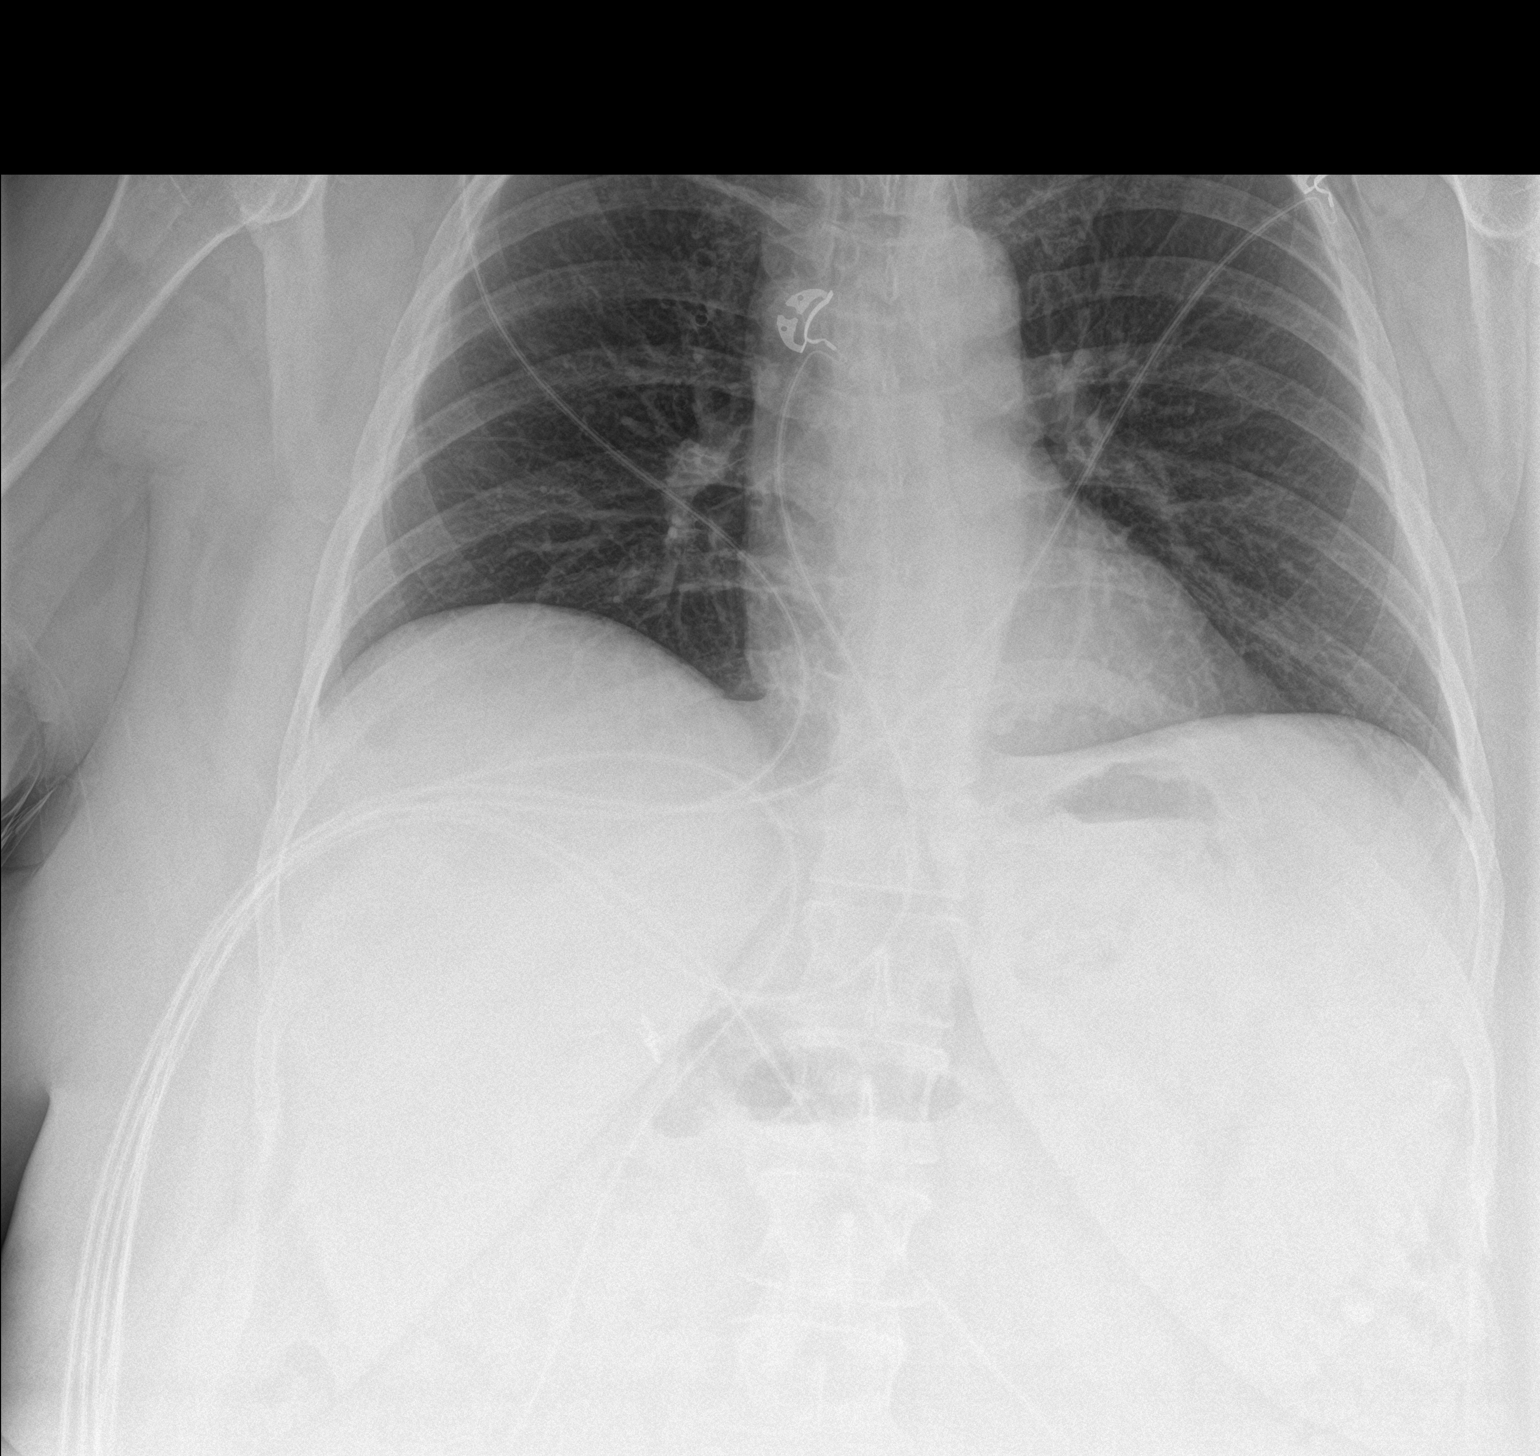

[chest lat]
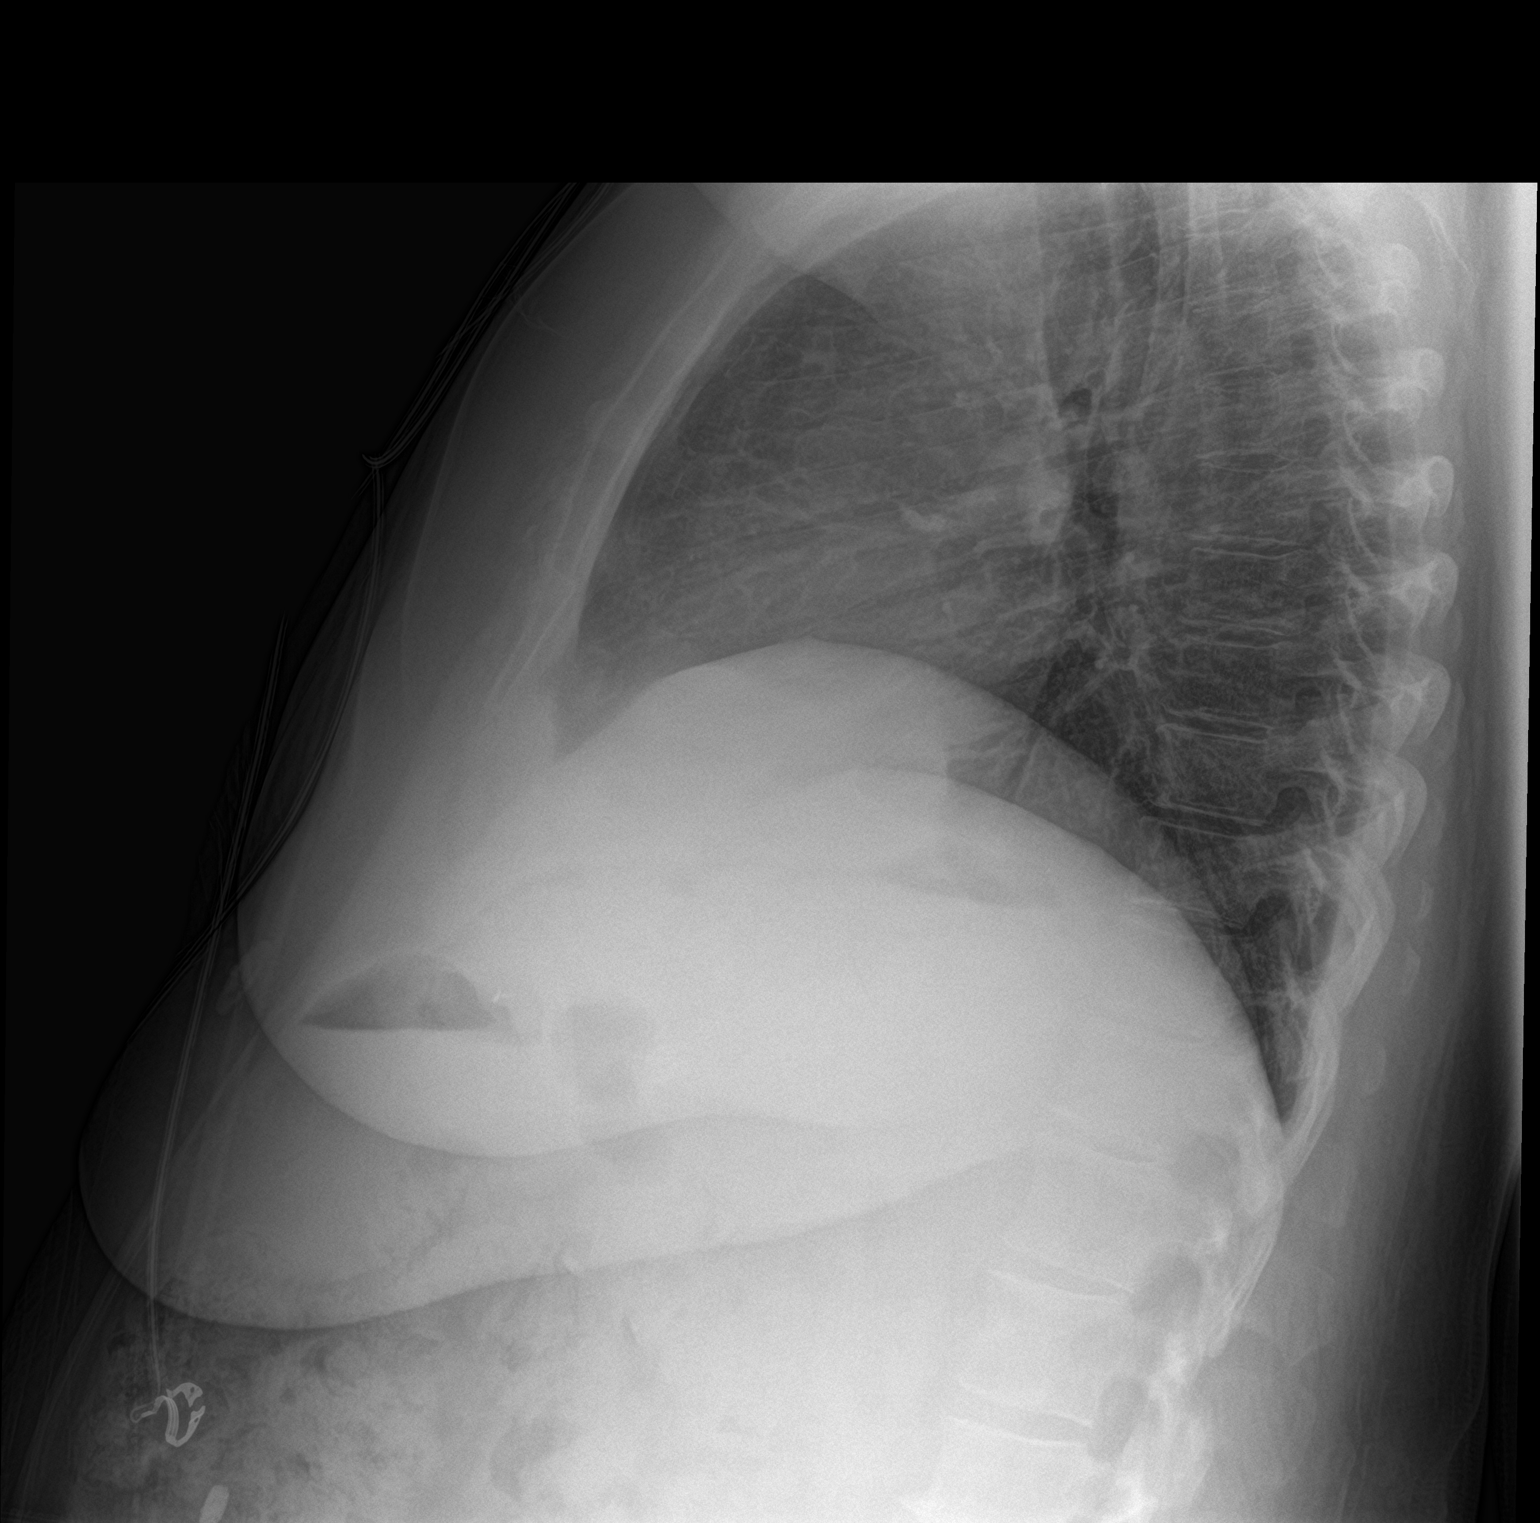

[2 of 2 positions shown; findings below may reference images not displayed]

FINDINGS: The lungs are clear. The heart size and pulmonary vascularity are
normal. No adenopathy. No pneumothorax. No bone lesions.
IMPRESSION: No edema or consolidation.

## 2020-08-03 ENCOUNTER — Telehealth: Payer: Self-pay

## 2020-08-03 NOTE — Progress Notes (Signed)
Subjective:  Patient ID: Linda [name], female    DOB: 1970-03-01  Age: 50 y.o. MRN: [id number]  Chief Complaint  Patient presents with   Diabetes   Hyperlipidemia   Hypothyroidism    HPI Diabetes: Patient is taking synjardy 13.0-8657 mg BID, Trulicity 8.46 mg. Patient check blood sugar daily and the range is 69- 250 mg/dl. She checks her foot daily. Appt with Dr. Cannon Kettle in August 2022.  Hypertension: Patient takes Losartan 50 mg daily, She does not check blood pressure at home.  Hyperlipidemia: she is on pravastatin 20 mg daily.  Hypothyroidism:  She takes levothyroxine 75 mcg daily. Current Outpatient Medications on File Prior to Visit  Medication Sig Dispense Refill   Blood Glucose Monitoring Suppl (ONETOUCH VERIO REFLECT) w/Device KIT AS DIRECTED     buPROPion (WELLBUTRIN XL) 300 MG 24 hr tablet Take 1 tablet (300 mg total) by mouth every evening. 90 tablet 1   calcium citrate-vitamin D (CITRACAL+D) 315-200 MG-UNIT tablet Take 1 tablet by mouth 2 (two) times daily.     clotrimazole-betamethasone (LOTRISONE) cream Apply small amount to affected area twice daily 45 g 0   Continuous Blood Gluc Receiver (FREESTYLE LIBRE 2 READER) DEVI E11.69 Check blood sugar 4 times daily as directed 1 each 0   Continuous Blood Gluc Sensor (FREESTYLE LIBRE 2 SENSOR) MISC E11.69 Change sensor every 14 days as directed 6 each 3   cyclobenzaprine (FLEXERIL) 10 MG tablet TAKE ONE TABLET BY MOUTH EVERY 8 HOURS AS NEEDED FOR MUSCLE SPASMS 30 tablet 3   dicyclomine (BENTYL) 20 MG tablet TAKE ONE TABLET BY MOUTH BEFORE MEALS AND AT BEDTIME AS NEEDED FOR STOMACH CRAMPING 180 tablet 1   Empagliflozin-metFORMIN HCl (SYNJARDY) 12.05-998 MG TABS Take 1 tablet by mouth 2 (two) times daily. 180 tablet 0   famotidine (PEPCID) 20 MG tablet Take 1 tablet (20 mg total) by mouth 2 (two) times daily. 180 tablet 1   ferrous sulfate 325 (65 FE) MG tablet Take 325 mg by mouth every evening.     gabapentin  (NEURONTIN) 300 MG capsule TAKE ONE CAPSULE BY MOUTH THREE TIMES DAILY 270 capsule 0   Levomilnacipran HCl ER (FETZIMA) 80 MG CP24 Take 1 capsule by mouth daily. 90 capsule 1   levothyroxine (SYNTHROID) 75 MCG tablet Take 1 tablet (75 mcg total) by mouth daily. 90 tablet 1   loratadine (CLARITIN) 10 MG tablet Take 10 mg by mouth daily. Taking 3-4 days each week due to bone pain associated with shot from cancer center.     LORazepam (ATIVAN) 0.5 MG tablet TAKE ONE TABLET BY MOUTH TWICE DAILY AS NEEDED FOR ANXIETY 30 tablet 2   losartan (COZAAR) 50 MG tablet Take 1 tablet (50 mg total) by mouth every evening. 90 tablet 1   Magnesium 500 MG CAPS 500 mg.     Multiple Vitamin (MULTIVITAMIN WITH MINERALS) TABS tablet Take 1 tablet by mouth daily.      ondansetron (ZOFRAN) 4 MG tablet TAKE ONE TABLET BY MOUTH EVERY 4 HOURS AS NEEDED FOR NAUSEA 90 tablet 0   OneTouch Delica Lancets 96E MISC 1 each by Does not apply route daily before breakfast. Check blood sugar twice daily. 100 each 3   pantoprazole (PROTONIX) 40 MG tablet Take 1 tablet (40 mg total) by mouth 2 (two) times daily. 180 tablet 1   Pegfilgrastim-jmdb (FULPHILA [place]) Inject into the skin.     potassium chloride (KLOR-CON) 20 MEQ packet 20 mEq in the morning and at bedtime. (  Patient not taking: No sig reported)     potassium chloride (MICRO-K) 10 MEQ CR capsule TAKE TWO CAPSULES BY MOUTH TWICE DAILY 180 capsule 1   pravastatin (PRAVACHOL) 20 MG tablet Take 1 tablet (20 mg total) by mouth at bedtime. 90 tablet 1   prochlorperazine (COMPAZINE) 10 MG tablet Take 1 tablet (10 mg total) by mouth every 6 (six) hours as needed for nausea or vomiting. (Patient not taking: Reported on 07/13/2020) 90 tablet 3   prochlorperazine (COMPAZINE) 10 MG tablet Take 1 tablet (10 mg total) by mouth every 6 (six) hours as needed for nausea or vomiting. 90 tablet 3   rOPINIRole (REQUIP) 0.25 MG tablet TAKE ONE TABLET BY MOUTH EVERYDAY AT BEDTIME 30 tablet 2   senna  (SENOKOT) 8.6 MG tablet Take 1 tablet by mouth daily as needed for constipation.     SODIUM FLUORIDE 5000 SENSITIVE 1.1-5 % GEL Take by mouth 2 (two) times daily.     Specialty Vitamins Products (MAGNESIUM, AMINO ACID CHELATE,) 133 MG tablet Take 1 tablet by mouth at bedtime.     sucralfate (CARAFATE) 1 g tablet Take 1 tablet (1 g total) by mouth 4 (four) times daily -  with meals and at bedtime. 270 tablet 1   Vitamin D, Ergocalciferol, (DRISDOL) 1.25 MG (50000 UNIT) CAPS capsule TAKE ONE CAPSULE BY MOUTH ONCE WEEKLY ON FRIDAY 12 capsule 1   zolpidem (AMBIEN) 10 MG tablet TAKE ONE TABLET BY MOUTH EVERYDAY AT BEDTIME AS NEEDED 30 tablet 5   No current facility-administered medications on file prior to visit.   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 No longer has   Past Surgical History:  Procedure Laterality Date   APPENDECTOMY     BACK SURGERY     x 3 lower disc   BILATERAL TOTAL MASTECTOMY WITH AXILLARY LYMPH NODE DISSECTION Bilateral 09/2019   CHOLECYSTECTOMY     nephrolithiasis     ROBOTIC ASSISTED TOTAL HYSTERECTOMY WITH BILATERAL SALPINGO OOPHERECTOMY Bilateral 06/14/2016   Procedure: ROBOTIC ASSISTED TOTAL  HYSTERECTOMY WITH BILATERAL SALPINGO OOPHORECTOMY;  Surgeon: Nancy Marus, MD;  Location: [facility];  Service: Gynecology;  Laterality: Bilateral;    Family History  Problem Relation Age of Onset   Cancer Mother        breast   CAD Father    Diabetes Father    Heart failure Father    Cancer Father        renal carcinoma   Kidney failure Father    Stroke Paternal Grandmother    Social History  Socioeconomic History   Marital status: Divorced    Spouse name: Not on file   Number of children: Not on file   Years of education: Not on file   Highest education level: Not on file  Occupational History   Occupation: disabled  Tobacco Use   Smoking status: Never   Smokeless tobacco: Never  Substance and Sexual Activity   Alcohol use: No   Drug use: No   Sexual activity: Yes  Other Topics Concern   Not on file  Social History Narrative   wears sunscreen, brushes and flosses daily, see's dentist bi-annually, has smoke/carbon monoxide detectors, wears a seatbelt and practices gun safety   Social Determinants of Health   Financial Resource Strain: Not on file  Food Insecurity: Not on file  Transportation Needs: Not on file  Physical Activity: Not on file  Stress: Not on file  Social Connections: Not on file    Review of Systems  Constitutional:  Positive for fatigue and unexpected weight change. Negative for fever.  HENT:  Negative for congestion, ear pain and sore throat.   Eyes:  Positive for visual disturbance.  Respiratory:  Negative for cough and shortness of breath.   Cardiovascular:  Negative for chest pain and palpitations.  Gastrointestinal:  Positive for nausea. Negative for abdominal pain, blood in stool, diarrhea and vomiting.  Endocrine: Positive for polyphagia. Negative for polydipsia.  Genitourinary:  Negative for dysuria, enuresis, frequency and hematuria.  Musculoskeletal:  Positive for arthralgias, back pain and myalgias.  Skin:  Negative for rash.   Neurological:  Positive for weakness. Negative for headaches.    Objective:  BP 110/70   Pulse (!) 104   Temp 98 F (36.7 C)   Resp 18   Ht _0  (1.575 m)   Wt 183 lb (83 kg)   LMP 06/13/2009   BMI 33.47 kg/m   BP/Weight 07/24/2020 05/20/2020 2/94/7654  Systolic BP 650 354 656  Diastolic BP 70 65 60  Wt. (Lbs) 183 182.7 184  BMI 33.47 33.42 33.65    Physical Exam Vitals reviewed.  Constitutional:      Appearance: Normal appearance.  Neck:     Vascular: No carotid bruit.  Cardiovascular:     Rate and Rhythm: Normal rate and regular rhythm.     Pulses: Normal pulses.     Heart sounds: Normal heart sounds.  Pulmonary:     Effort: Pulmonary effort is normal.     Breath sounds: Normal breath sounds.  Abdominal:     General: Bowel sounds are normal.     Palpations: Abdomen is soft.     Tenderness: There is no abdominal tenderness.  Neurological:     Mental Status: She is alert and oriented to person, place, and time.  Psychiatric:        Mood and Affect: Mood normal.        Behavior: Behavior normal.    Diabetic Foot Exam - Simple   No data filed      Lab Results  Component Value Date   WBC 5.9 07/24/2020   HGB 12.2 07/24/2020   HCT 37.6 07/24/2020   PLT 89 (LL) 07/24/2020   GLUCOSE 114 (H) 07/24/2020   CHOL 165 07/24/2020   TRIG 126 07/24/2020   HDL 57 07/24/2020   LDLCALC 86 07/24/2020   ALT 20 07/24/2020   AST 16 07/24/2020   NA 140 07/24/2020   K 4.0 07/24/2020   CL 98 07/24/2020   CREATININE 0.99 07/24/2020  BUN 15 07/24/2020   CO2 26 07/24/2020   TSH 1.440 07/24/2020   HGBA1C 5.8 (H) 07/24/2020   MICROALBUR 30 10/09/2019      Assessment & Plan:   1.  Diabetic glomerulopathy (HCC) Control: good Recommend check sugars fasting daily. Recommend check feet daily. Recommend annual eye exams. Medicines: Increase trulicity to 1.5 mg weekly.  Continue to work on eating a healthy diet and exercise.  Labs drawn today.    - POCT UA -  Microalbumin - CBC with Differential/Platelet - Comprehensive metabolic panel - Hemoglobin A1c  3. Hypertensive kidney disease with stage 3b chronic kidney disease ([place]) The current medical regimen is effective;  continue present plan and medications.   4. Mixed hyperlipidemia Well controlled.  No changes to medicines.  Continue to work on eating a healthy diet and exercise.  Labs drawn today.   - Lipid panel  5. Vitamin D insufficiency The current medical regimen is effective;  continue present plan and medications.  6. Chronic pain syndrome The current medical regimen is effective;  continue present plan and medications.  7. Idiopathic thrombocytopenic purpura (ITP) (HCC)  - cbc  8. Acquired hypothyroidism The current medical regimen is effective;  continue present plan and medications.  - TSH  9. Uncomplicated opioid dependence (HCC) - Pain Mgt Scrn (14 Drugs), Ur    Meds ordered this encounter  Medications   Dulaglutide (TRULICITY) 1.5 ZE/0.9QZ SOPN    Sig: Inject 1.5 mg into the skin once a week.    Dispense:  2 mL    Refill:  2    Orders Placed This Encounter  Procedures   Pain Mgt Scrn (14 Drugs), Ur   CBC with Differential/Platelet   Comprehensive metabolic panel   Hemoglobin A1c   Lipid panel   TSH   Cardiovascular Risk Assessment   POCT UA - Microalbumin    I,Marla I Leal-Borjas,acting as a scribe for [name], MD.,have documented all relevant documentation on the behalf of [name], MD,as directed by  [name], MD while in the presence of [name], MD.   Follow-up: No follow-ups on file.  An After Visit Summary was printed and given to the patient.  [name], MD [facility] [phone]

## 2020-08-03 NOTE — Telephone Encounter (Signed)
-----   Message from Burnice Logan, [facility] sent at 08/03/2020  2:56 PM EDT ----- Regarding: FW: medication changes Do you have time to call and update her?  ----- Message ----- From: [name], MD Sent: 08/02/2020  10:58 PM EDT To: Burnice Logan, RPH Subject: medication changes                             I saw Linda [name] after your appointment with her.  She did not discuss buspirone (panic attacks) or increased neuropathy.  I would recommend buspirone 5 mg one three times a day.  I would recommend increase gabapentin to 600 mg one three times a day.

## 2020-08-03 NOTE — Progress Notes (Signed)
  Chronic Care Management Pharmacy Assistant   Name: Linda [name]  MRN: [id number] DOB: 02/23/1970    Reason for Encounter: Medication Changes   Medications: Outpatient Encounter Medications as of 08/03/2020  Medication Sig Note   Blood Glucose Monitoring Suppl (ONETOUCH VERIO REFLECT) w/Device KIT AS DIRECTED    buPROPion (WELLBUTRIN XL) 300 MG 24 hr tablet Take 1 tablet (300 mg total) by mouth every evening.    calcium citrate-vitamin D (CITRACAL+D) 315-200 MG-UNIT tablet Take 1 tablet by mouth 2 (two) times daily.    clotrimazole-betamethasone (LOTRISONE) cream Apply small amount to affected area twice daily    Continuous Blood Gluc Receiver (FREESTYLE LIBRE 2 READER) DEVI E11.69 Check blood sugar 4 times daily as directed    Continuous Blood Gluc Sensor (FREESTYLE LIBRE 2 SENSOR) MISC E11.69 Change sensor every 14 days as directed    cyclobenzaprine (FLEXERIL) 10 MG tablet TAKE ONE TABLET BY MOUTH EVERY 8 HOURS AS NEEDED FOR MUSCLE SPASMS    dicyclomine (BENTYL) 20 MG tablet TAKE ONE TABLET BY MOUTH BEFORE MEALS AND AT BEDTIME AS NEEDED FOR STOMACH CRAMPING    Dulaglutide (TRULICITY) 1.5 MG/0.5ML SOPN Inject 1.5 mg into the skin once a week.    Empagliflozin-metFORMIN HCl (SYNJARDY) 12.05-998 MG TABS Take 1 tablet by mouth 2 (two) times daily.    famotidine (PEPCID) 20 MG tablet Take 1 tablet (20 mg total) by mouth 2 (two) times daily.    ferrous sulfate 325 (65 FE) MG tablet Take 325 mg by mouth every evening.    gabapentin (NEURONTIN) 300 MG capsule TAKE ONE CAPSULE BY MOUTH THREE TIMES DAILY    Levomilnacipran HCl ER (FETZIMA) 80 MG CP24 Take 1 capsule by mouth daily.    levothyroxine (SYNTHROID) 75 MCG tablet Take 1 tablet (75 mcg total) by mouth daily.    loratadine (CLARITIN) 10 MG tablet Take 10 mg by mouth daily. Taking 3-4 days each week due to bone pain associated with shot from cancer center.    LORazepam (ATIVAN) 0.5 MG tablet TAKE ONE TABLET BY MOUTH TWICE  DAILY AS NEEDED FOR ANXIETY    losartan (COZAAR) 50 MG tablet Take 1 tablet (50 mg total) by mouth every evening.    Magnesium 500 MG CAPS 500 mg.    morphine (MS CONTIN) 30 MG 12 hr tablet Take 1 tablet (30 mg total) by mouth every 12 (twelve) hours.    Multiple Vitamin (MULTIVITAMIN WITH MINERALS) TABS tablet Take 1 tablet by mouth daily.  06/08/2018: Pt plans on purchasing again when she can get out.   ondansetron (ZOFRAN) 4 MG tablet TAKE ONE TABLET BY MOUTH EVERY 4 HOURS AS NEEDED FOR NAUSEA    OneTouch Delica Lancets 30G MISC 1 each by Does not apply route daily before breakfast. Check blood sugar twice daily.    pantoprazole (PROTONIX) 40 MG tablet Take 1 tablet (40 mg total) by mouth 2 (two) times daily.    Pegfilgrastim-jmdb (FULPHILA ) Inject into the skin.    potassium chloride (KLOR-CON) 20 MEQ packet 20 mEq in the morning and at bedtime. (Patient not taking: No sig reported)    potassium chloride (MICRO-K) 10 MEQ CR capsule TAKE TWO CAPSULES BY MOUTH TWICE DAILY    pravastatin (PRAVACHOL) 20 MG tablet Take 1 tablet (20 mg total) by mouth at bedtime.    prochlorperazine (COMPAZINE) 10 MG tablet Take 1 tablet (10 mg total) by mouth every 6 (six) hours as needed for nausea or vomiting. (Patient not taking: Reported on   07/13/2020)    prochlorperazine (COMPAZINE) 10 MG tablet Take 1 tablet (10 mg total) by mouth every 6 (six) hours as needed for nausea or vomiting.    rOPINIRole (REQUIP) 0.25 MG tablet TAKE ONE TABLET BY MOUTH EVERYDAY AT BEDTIME    senna (SENOKOT) 8.6 MG tablet Take 1 tablet by mouth daily as needed for constipation.    SODIUM FLUORIDE 5000 SENSITIVE 1.1-5 % GEL Take by mouth 2 (two) times daily.    Specialty Vitamins Products (MAGNESIUM, AMINO ACID CHELATE,) 133 MG tablet Take 1 tablet by mouth at bedtime.    sucralfate (CARAFATE) 1 g tablet Take 1 tablet (1 g total) by mouth 4 (four) times daily -  with meals and at bedtime.    Vitamin D, Ergocalciferol, (DRISDOL) 1.25  MG (50000 UNIT) CAPS capsule TAKE ONE CAPSULE BY MOUTH ONCE WEEKLY ON FRIDAY    zolpidem (AMBIEN) 10 MG tablet TAKE ONE TABLET BY MOUTH EVERYDAY AT BEDTIME AS NEEDED    No facility-administered encounter medications on file as of 08/03/2020.    Contacted Linda [name] to discuss medication changes recommended by Dr. [name]. Patient advised Dr. [name] wanted to increase the gabapentin to 600 mg TID and start her on buspirone 5 mg TID. Patient verbalized understanding.  Acute forms for both gabapentin and buspirone have been completed and sent to [facility] for delivery this week.   Sara Beth Brown, CPP notified  Lindsay Saintsing, CMA Clinical Pharmacy Assistant [phone]  

## 2020-08-04 ENCOUNTER — Telehealth: Payer: Self-pay

## 2020-08-04 ENCOUNTER — Other Ambulatory Visit: Payer: Self-pay

## 2020-08-04 DIAGNOSIS — Z1501 Genetic susceptibility to malignant neoplasm of breast: Secondary | ICD-10-CM

## 2020-08-04 MED ORDER — BUSPIRONE HCL 5 MG PO TABS: 5.0000 mg | ORAL_TABLET | Freq: Three times a day (TID) | ORAL | 2 refills | Status: DC

## 2020-08-04 MED ORDER — GABAPENTIN 300 MG PO CAPS: 600.0000 mg | ORAL_CAPSULE | Freq: Three times a day (TID) | ORAL | 2 refills | Status: DC

## 2020-08-04 NOTE — Chronic Care Management (AMB) (Signed)
Contacted patient to inform her that per [facility] insurance will not cover her increased gabapentin until 08/23/20. She may take 2 of the 300 mg capsules TID until then. Had to leave a message for patient to return call.   Sherre Poot, CPP notified  Margaretmary Dys, [facility] Assistant [phone]

## 2020-08-04 NOTE — Telephone Encounter (Signed)
Phone encounter opened in error

## 2020-08-18 DIAGNOSIS — K2961 Other gastritis with bleeding: Secondary | ICD-10-CM | POA: Diagnosis not present

## 2020-08-18 DIAGNOSIS — K219 Gastro-esophageal reflux disease without esophagitis: Secondary | ICD-10-CM | POA: Diagnosis not present

## 2020-08-18 DIAGNOSIS — T402X5A Adverse effect of other opioids, initial encounter: Secondary | ICD-10-CM | POA: Diagnosis not present

## 2020-08-18 DIAGNOSIS — K5903 Drug induced constipation: Secondary | ICD-10-CM | POA: Diagnosis not present

## 2020-08-19 ENCOUNTER — Inpatient Hospital Stay: Payer: PPO | Attending: Hematology and Oncology

## 2020-08-19 ENCOUNTER — Inpatient Hospital Stay (INDEPENDENT_AMBULATORY_CARE_PROVIDER_SITE_OTHER): Payer: PPO | Admitting: Hematology and Oncology

## 2020-08-19 ENCOUNTER — Other Ambulatory Visit: Payer: Self-pay | Admitting: Hematology and Oncology

## 2020-08-19 ENCOUNTER — Encounter: Payer: Self-pay | Admitting: Hematology and Oncology

## 2020-08-19 ENCOUNTER — Other Ambulatory Visit: Payer: Self-pay

## 2020-08-19 ENCOUNTER — Telehealth: Payer: Self-pay | Admitting: Hematology and Oncology

## 2020-08-19 VITALS — BP 145/65 | HR 98 | Temp 98.7°F | Resp 18 | Ht 62.0 in | Wt 194.5 lb

## 2020-08-19 DIAGNOSIS — Z1502 Genetic susceptibility to malignant neoplasm of ovary: Secondary | ICD-10-CM | POA: Diagnosis not present

## 2020-08-19 DIAGNOSIS — Z1501 Genetic susceptibility to malignant neoplasm of breast: Secondary | ICD-10-CM | POA: Insufficient documentation

## 2020-08-19 DIAGNOSIS — Z171 Estrogen receptor negative status [ER-]: Secondary | ICD-10-CM

## 2020-08-19 DIAGNOSIS — C50311 Malignant neoplasm of lower-inner quadrant of right female breast: Secondary | ICD-10-CM | POA: Diagnosis not present

## 2020-08-19 DIAGNOSIS — Z9013 Acquired absence of bilateral breasts and nipples: Secondary | ICD-10-CM | POA: Diagnosis not present

## 2020-08-19 DIAGNOSIS — C50811 Malignant neoplasm of overlapping sites of right female breast: Secondary | ICD-10-CM | POA: Diagnosis not present

## 2020-08-19 DIAGNOSIS — K7460 Unspecified cirrhosis of liver: Secondary | ICD-10-CM | POA: Diagnosis not present

## 2020-08-19 DIAGNOSIS — D649 Anemia, unspecified: Secondary | ICD-10-CM | POA: Diagnosis not present

## 2020-08-19 DIAGNOSIS — Z79899 Other long term (current) drug therapy: Secondary | ICD-10-CM | POA: Insufficient documentation

## 2020-08-19 DIAGNOSIS — E860 Dehydration: Secondary | ICD-10-CM

## 2020-08-19 LAB — BASIC METABOLIC PANEL
BUN: 18 (ref 4–21)
CO2: 32 — AB (ref 13–22)
Chloride: 99 (ref 99–108)
Creatinine: 1 (ref 0.5–1.1)
Glucose: 119
Potassium: 4.1 (ref 3.4–5.3)
Sodium: 139 (ref 137–147)

## 2020-08-19 LAB — CBC AND DIFFERENTIAL
HCT: 36 (ref 36–46)
Hemoglobin: 11.6 — AB (ref 12.0–16.0)
Neutrophils Absolute: 3.94
Platelets: 79 — AB (ref 150–399)
WBC: 5.4

## 2020-08-19 LAB — COMPREHENSIVE METABOLIC PANEL
Albumin: 4.1 (ref 3.5–5.0)
Calcium: 9.6 (ref 8.7–10.7)

## 2020-08-19 LAB — HEPATIC FUNCTION PANEL
ALT: 16 (ref 7–35)
AST: 21 (ref 13–35)
Alkaline Phosphatase: 71 (ref 25–125)
Bilirubin, Total: 0.7

## 2020-08-19 LAB — CBC: RBC: 4.17 (ref 3.87–5.11)

## 2020-08-19 MED ORDER — ALTEPLASE 2 MG IJ SOLR
2.0000 mg | Freq: Once | INTRAMUSCULAR | Status: DC | PRN
  Filled 2020-08-19: qty 2

## 2020-08-19 MED ORDER — PROCHLORPERAZINE MALEATE 10 MG PO TABS: 10.0000 mg | ORAL_TABLET | Freq: Four times a day (QID) | ORAL | 3 refills | Status: DC | PRN

## 2020-08-19 MED ORDER — ONDANSETRON HCL 4 MG PO TABS: 4.0000 mg | ORAL_TABLET | ORAL | 3 refills | Status: DC | PRN

## 2020-08-19 MED ORDER — SODIUM CHLORIDE 0.9% FLUSH
3.0000 mL | Freq: Once | INTRAVENOUS | Status: DC | PRN
  Filled 2020-08-19: qty 10

## 2020-08-19 MED ORDER — HEPARIN SOD (PORK) LOCK FLUSH 100 UNIT/ML IV SOLN
500.0000 [IU] | Freq: Once | INTRAVENOUS | Status: AC | PRN
  Administered 2020-08-19: 500 [IU]
  Filled 2020-08-19: qty 5

## 2020-08-19 MED ORDER — SODIUM CHLORIDE 0.9% FLUSH
10.0000 mL | Freq: Once | INTRAVENOUS | Status: AC | PRN
  Administered 2020-08-19: 10 mL
  Filled 2020-08-19: qty 10

## 2020-08-19 NOTE — Telephone Encounter (Signed)
Per 7/27 LOS, patient scheduled for Oct Appt's.  Gave patient Appt Summary

## 2020-08-19 NOTE — Progress Notes (Signed)
[facility]  [address] [place],  [place]  [address] [phone]  Clinic Day:  08/26/2020  Referring physician: [name], MD  CHIEF COMPLAINT:  CC: A 50 year old with history of stage IB triple negative breast cancer, status post 4 cycles of Adriamycin/cyclophosphamide here for 1 month evaluation.  Current Treatment:   Weekly paclitaxel for 12 cycles; discontinued after 8 cycles due to osteomyelitis   HISTORY OF PRESENT ILLNESS:  Linda [name] is a 50 y.o. female who we began seeing in February 2018 for evaluation of anemia.  Her anemia was felt to be secondary to iron deficiency and we recommended continuation of oral iron supplementation for a total of 6 months, as well as referral back to the gastroenterologist.  The patient also has a history of thrombocytopenia and had previously seen Dr. Bobby Rumpf in 2014.  The thrombocytopenia was felt to be secondary to mild chronic immune thrombocytopenic purpura (ITP).  Due to the patient's family history of malignancy, she underwent testing for hereditary cancer syndromes with the [employer] myRisk Hereditary Cancer Panel test.  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  She underwent a robotic hysterectomy and bilateral salpingo-oophorectomy in May 2018 with Dr. Everitt Amber.  Pathology was benign.  We also discussed the option of risk reducing bilateral mastectomy, but she has chosen to have close surveillance, so we recommended annual MRI breast, in addition to mammography.  She was placed on chemoprevention with raloxifene in September 2018.  CT abdomen and pelvis in August 2020 done for bilateral flank pain, revealed a tiny left renal calculus, as well as probable hepatic cirrhosis without evidence of hepatic mass.  She was then referred to [facility] and  is followed by Roosevelt Locks, CRNP in [place] for her liver cirrhosis.  She tested negative for hepatitis A, B, and C.  She received her hepatitis vaccines in 2020.   We saw her in September 2021 for a new diagnosis of stage IB (T1c N0 M0) triple negative right breast cancer.  She underwent screening bilateral mammogram on August 3rd which revealed possible masses in the right breast.  Diagnostic right mammogram and right breast ultrasound from August 18th confirmed suspicious masses in the right breast at 5 o'clock measuring 1.5 cm and 6 o'clock measuring 1.4 cm.  There was an indeterminate 4 mm with possible distortion in the outer right breast without sonographic correlate.  Right axillary ultrasound was normal.  She then underwent biopsies of both masses and surgical pathology from these procedures revealed  invasive ductal carcinoma, grade 3, with necrosis at 5 o'clock; and invasive ductal carcinoma, grade 1-2, with necrosis and focal myxoid change at 6 o'clock.  No DCIS was identified.  Breast prognostic profiles revealed HER2, and estrogen and progesterone receptors to be negative. Ki67 was 30% at the 5 o'clock mass, and 40% at 6 o'clock.  She underwent bilateral mastectomies on September 24th with Dr. Noberto Retort. Surgical pathology from this procedure revealed invasive ductal carcinoma, grade 3, with myxoid change, 19 mm, at 6 o'clock, and invasive ductal carcinoma, grade 3, and ductal carcinoma in situ, 19 mm, at 5 o'clock.  All margins were negative for invasive carcinoma or DCIS.  Two sentinel lymph nodes were negative for metastatic carcinoma (0/2). CT chest, abdomen and pelvis in September did not reveal any evidence of metastatic disease.  She started dose dense AC chemotherapy on October 25th , with plans to follow this with weekly paclitaxel for 12 doses.  She had severe restless leg syndrome with oral corticosteroids.  At her visit on November 29th prior to a 3rd cycle of AC chemotherapy she had  significantly worsened anemia with a hemoglobin of 8.7, so we decided to wait 3 weeks for her 4th cycle of chemotherapy.  Due to cytopenias, this had to be delayed at least 2 more weeks, but she finally got her final dose on January 4th.  Iron studies, B12 and folate were normal. Her anemia has slowly improved.  She started weekly paclitaxel on January 26th and has received 8 out of 12 planned doses.   She did have some expected pancytopenia.  She has pre-existing neuropathy of the feet and legs.    She was admitted to the hospital for osteomyelitis of the left 2nd toe.  She was treated with empiric vancomycin and Zosyn.  Amputation was recommended but the patient refused.  Cultures returned with Klebsiella, sensitive to IV Rocephin, so long term antibiotics 2 g Q24H will be continued for the next 6 weeks. At this time, we decided to discontinue weekly paclitaxel as she had received 8/12 cycles.   INTERVAL HISTORY:  Linda [name] is here for 1 month evaluation.She has been well since last visit. She denies fever, chills, nausea or vomiting. She denies shortness of breath, chest pain or cough. She denies issue with bowel or bladder. She continues with anxiety/depression but is using medication. Her appetite is good and her energy is fair. CBC and CMP today are unremarkable other than a platelet count of 79.  REVIEW OF SYSTEMS:  Review of Systems  Constitutional:  Negative for appetite change, chills, diaphoresis, fatigue, fever and unexpected weight change.  HENT:   Negative for hearing loss, lump/mass, mouth sores, nosebleeds, sore throat, tinnitus, trouble swallowing and voice change.   Eyes:  Negative for eye problems and icterus.  Respiratory:  Negative for chest tightness, cough, hemoptysis, shortness of breath and wheezing.   Cardiovascular:  Negative for chest pain, leg swelling and palpitations.  Gastrointestinal:  Negative for abdominal distention, abdominal pain, blood in stool, constipation and rectal  pain.  Endocrine: Negative for hot flashes.  Genitourinary:  Negative for bladder incontinence, difficulty urinating, dyspareunia, dysuria, frequency, hematuria and nocturia.   Musculoskeletal:  Negative for arthralgias, back pain, flank pain, myalgias, neck pain and neck stiffness.  Skin:  Negative for itching, rash and wound.  Neurological:  Negative for dizziness, extremity weakness, headaches, light-headedness, numbness, seizures and speech difficulty.  Hematological:  Negative for adenopathy. Does not bruise/bleed easily.  Psychiatric/Behavioral:  Positive for depression. Negative for confusion, decreased concentration, sleep disturbance and suicidal ideas. The patient is nervous/anxious.    VITALS:  Blood pressure (!) 145/65, pulse 98, temperature 98.7 F (37.1 C), temperature source Oral, resp. rate 18, height 5' 2"  (1.575 m), weight 194 lb 8 oz (88.2 kg), last menstrual period 06/13/2009, SpO2 94 %.  Wt Readings from Last 3 Encounters:  08/19/20 194 lb 8 oz (88.2 kg)  07/24/20 183 lb (83 kg)  05/20/20 182 lb 11.2 oz (82.9 kg)    Body mass index is 35.57 kg/m.  Performance status (ECOG): 1 - Symptomatic but completely ambulatory  PHYSICAL EXAM:  Physical Exam Constitutional:      General: She is not in acute distress.    Appearance: Normal appearance. She is normal weight. She is not ill-appearing, toxic-appearing or diaphoretic.  HENT:  Head: Normocephalic and atraumatic.     Nose: Nose normal. No congestion or rhinorrhea.     Mouth/Throat:     Mouth: Mucous membranes are moist.     Pharynx: Oropharynx is clear. No oropharyngeal exudate or posterior oropharyngeal erythema.  Eyes:     General: No scleral icterus.       Right eye: No discharge.        Left eye: No discharge.     Extraocular Movements: Extraocular movements intact.     Conjunctiva/sclera: Conjunctivae normal.     Pupils: Pupils are equal, round, and reactive to light.  Neck:     Vascular: No carotid  bruit.  Cardiovascular:     Rate and Rhythm: Normal rate and regular rhythm.     Heart sounds: No murmur heard.   No friction rub. No gallop.  Pulmonary:     Effort: Pulmonary effort is normal. No respiratory distress.     Breath sounds: Normal breath sounds. No stridor. No wheezing, rhonchi or rales.  Chest:     Chest wall: No tenderness.  Abdominal:     General: Abdomen is flat. Bowel sounds are normal. There is no distension.     Palpations: There is no mass.     Tenderness: There is no abdominal tenderness. There is no right CVA tenderness, left CVA tenderness, guarding or rebound.     Hernia: No hernia is present.  Musculoskeletal:        General: No swelling, tenderness, deformity or signs of injury. Normal range of motion.     Cervical back: Normal range of motion and neck supple. No rigidity or tenderness.     Right lower leg: No edema.     Left lower leg: No edema.  Lymphadenopathy:     Cervical: No cervical adenopathy.  Skin:    General: Skin is warm and dry.     Capillary Refill: Capillary refill takes less than 2 seconds.     Coloration: Skin is not jaundiced or pale.     Findings: No bruising, erythema, lesion or rash.  Neurological:     General: No focal deficit present.     Mental Status: She is alert and oriented to person, place, and time. Mental status is at baseline.     Cranial Nerves: No cranial nerve deficit.     Sensory: No sensory deficit.     Motor: No weakness.     Coordination: Coordination normal.     Gait: Gait normal.     Deep Tendon Reflexes: Reflexes normal.  Psychiatric:        Mood and Affect: Mood normal.        Behavior: Behavior normal.        Thought Content: Thought content normal.        Judgment: Judgment normal.   LABS:   CBC Latest Ref Rng & Units 08/19/2020 07/24/2020 05/20/2020  WBC - 5.4 5.9 5.2  Hemoglobin 12.0 - 16.0 11.6(A) 12.2 12.5  Hematocrit 36 - 46 36 37.6 39  Platelets 150 - 399 79(A) 89(LL) 89(A)   CMP Latest Ref  Rng & Units 08/19/2020 07/24/2020 05/20/2020  Glucose 65 - 99 mg/dL - 114(H) -  BUN 4 - 21 18 15 14   Creatinine 0.5 - 1.1 1.0 0.99 0.8  Sodium 137 - 147 139 140 139  Potassium 3.4 - 5.3 4.1 4.0 4.2  Chloride 99 - 108 99 98 101  CO2 13 - 22 32(A) 26 28(A)  Calcium 8.7 - 10.7  9.6 9.4 9.7  Total Protein 6.0 - 8.5 g/dL - 6.2 -  Total Bilirubin 0.0 - 1.2 mg/dL - 0.5 -  Alkaline Phos 25 - 125 71 76 74  AST 13 - 35 21 16 31   ALT 7 - 35 16 20 18       Lab Results  Component Value Date   TIBC 312 05/20/2020   TIBC 300 01/13/2020   FERRITIN 98 05/20/2020   IRONPCTSAT 26 05/20/2020   IRONPCTSAT 24.3 01/13/2020   No results found for: LDH  STUDIES:    HISTORY:   Allergies:  Allergies  Allergen Reactions   Codeine Shortness Of Breath    Other reaction(s): SHOB   Celecoxib Other (See Comments)    Unknown reaction Other reaction(s): Unknown   Ezetimibe     Other reaction(s): Unknown   Ezetimibe-Simvastatin Other (See Comments)    Unknown reaction   Propranolol     Other reaction(s): Unknown   Propranolol Hcl Other (See Comments)    Unknown reaction   Simvastatin     Other reaction(s): Unknown    Current Medications: Current Outpatient Medications  Medication Sig Dispense Refill   Blood Glucose Monitoring Suppl (ONETOUCH VERIO REFLECT) w/Device KIT AS DIRECTED     buPROPion (WELLBUTRIN XL) 300 MG 24 hr tablet Take 1 tablet (300 mg total) by mouth every evening. 90 tablet 1   busPIRone (BUSPAR) 5 MG tablet Take 1 tablet (5 mg total) by mouth 3 (three) times daily. 90 tablet 2   calcium citrate-vitamin D (CITRACAL+D) 315-200 MG-UNIT tablet Take 1 tablet by mouth 2 (two) times daily.     clotrimazole-betamethasone (LOTRISONE) cream Apply small amount to affected area twice daily 45 g 0   Continuous Blood Gluc Receiver (FREESTYLE LIBRE 2 READER) DEVI E11.69 Check blood sugar 4 times daily as directed 1 each 0   Continuous Blood Gluc Sensor (FREESTYLE LIBRE 2 SENSOR) MISC E11.69  Change sensor every 14 days as directed 6 each 3   cyclobenzaprine (FLEXERIL) 10 MG tablet TAKE ONE TABLET BY MOUTH EVERY 8 HOURS AS NEEDED FOR MUSCLE SPASMS 30 tablet 3   dicyclomine (BENTYL) 20 MG tablet TAKE ONE TABLET BY MOUTH BEFORE MEALS AND AT BEDTIME AS NEEDED FOR STOMACH CRAMPING 180 tablet 1   Dulaglutide (TRULICITY) 1.5 FF/6.3WG SOPN Inject 1.5 mg into the skin once a week. 2 mL 2   Empagliflozin-metFORMIN HCl (SYNJARDY) 12.05-998 MG TABS Take 1 tablet by mouth 2 (two) times daily. 180 tablet 0   famotidine (PEPCID) 20 MG tablet Take 1 tablet (20 mg total) by mouth 2 (two) times daily. 180 tablet 1   ferrous sulfate 325 (65 FE) MG tablet Take 325 mg by mouth every evening.     gabapentin (NEURONTIN) 300 MG capsule Take 2 capsules (600 mg total) by mouth 3 (three) times daily. 180 capsule 2   Levomilnacipran HCl ER (FETZIMA) 80 MG CP24 Take 1 capsule by mouth daily. 90 capsule 1   levothyroxine (SYNTHROID) 75 MCG tablet Take 1 tablet (75 mcg total) by mouth daily. 90 tablet 1   loratadine (CLARITIN) 10 MG tablet Take 10 mg by mouth daily. Taking 3-4 days each week due to bone pain associated with shot from cancer center.     losartan (COZAAR) 50 MG tablet Take 1 tablet (50 mg total) by mouth every evening. 90 tablet 1   Magnesium 500 MG CAPS 500 mg.     morphine (MS CONTIN) 30 MG 12 hr tablet Take 1 tablet (30 mg  total) by mouth every 12 (twelve) hours. 60 tablet 0   Multiple Vitamin (MULTIVITAMIN WITH MINERALS) TABS tablet Take 1 tablet by mouth daily.      ondansetron (ZOFRAN) 4 MG tablet TAKE ONE TABLET BY MOUTH EVERY 4 HOURS AS NEEDED FOR NAUSEA 90 tablet 0   ondansetron (ZOFRAN) 4 MG tablet Take 1 tablet (4 mg total) by mouth every 4 (four) hours as needed for nausea. 90 tablet 3   OneTouch Delica Lancets 16X MISC 1 each by Does not apply route daily before breakfast. Check blood sugar twice daily. 100 each 3   pantoprazole (PROTONIX) 40 MG tablet Take 1 tablet (40 mg total) by mouth  2 (two) times daily. 180 tablet 1   Pegfilgrastim-jmdb (FULPHILA [place]) Inject into the skin.     pravastatin (PRAVACHOL) 20 MG tablet Take 1 tablet (20 mg total) by mouth at bedtime. 90 tablet 1   prochlorperazine (COMPAZINE) 10 MG tablet Take 1 tablet (10 mg total) by mouth every 6 (six) hours as needed for nausea or vomiting. 90 tablet 3   rOPINIRole (REQUIP) 0.25 MG tablet TAKE ONE TABLET BY MOUTH EVERYDAY AT BEDTIME 30 tablet 2   senna (SENOKOT) 8.6 MG tablet Take 1 tablet by mouth daily as needed for constipation.     SODIUM FLUORIDE 5000 SENSITIVE 1.1-5 % GEL Take by mouth 2 (two) times daily.     Specialty Vitamins Products (MAGNESIUM, AMINO ACID CHELATE,) 133 MG tablet Take 1 tablet by mouth at bedtime.     sucralfate (CARAFATE) 1 g tablet Take 1 tablet (1 g total) by mouth 4 (four) times daily -  with meals and at bedtime. 270 tablet 1   Vitamin D, Ergocalciferol, (DRISDOL) 1.25 MG (50000 UNIT) CAPS capsule TAKE ONE CAPSULE BY MOUTH ONCE WEEKLY ON FRIDAY 12 capsule 1   zolpidem (AMBIEN) 10 MG tablet TAKE ONE TABLET BY MOUTH EVERYDAY AT BEDTIME AS NEEDED 30 tablet 5   Current Facility-Administered Medications  Medication Dose Route Frequency Provider Last Rate Last Admin   alteplase (CATHFLO ACTIVASE) injection 2 mg  2 mg Intracatheter Once PRN [place] Scrape A, NP       sodium chloride flush (NS) 0.9 % injection 3 mL  3 mL Intracatheter Once PRN Melodye Ped, NP         ASSESSMENT & PLAN:   Assessment: 1. Positive BRCA 1 mutation, which increases her risk for breast and ovarian cancer, as well as elevates her risk for pancreatic cancer, for which she underwent hysterectomy/bilateral salpingo-oophorectomy and is on chemoprevention with raloxifene and regular surveillance.    2.  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  She has undergone bilateral  mastectomies.  She completed 4 cycles of Adriamycin/cyclophosphamide chemotherapy and received 8 out of 12 planned weeks of weekly paclitaxel.   3.  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  We feel this is a separate primary.  She has undergone bilateral mastectomies.  She completed 4 cycles of Adriamycin/cyclophosphamide chemotherapy and was receiving weekly paclitaxel but we will plan to discontinue this.  She has received 8 cycles.   4. Thrombocytopenia, which is felt to be due to chronic ITP. Platelets stable at 79.   5. Liver cirrhosis as seen on CT imaging in August 2020.  She has been referred and is being seen by Roosevelt Locks, CRNP, at the [facility] in [place].  Hepatitis panel was negative and she has received the appropriate vaccines.  Abdominal ultrasound from April 2021 was stable and CT imaging from September 2021 was stable and remains stable with most recent imaging.     Plan:   She will continue follow up with Dr. Melina Copa as needed. We will see her back in clinic in 3 months for evaluation.   She verbalizes understanding of and agreement to the plans discussed today. She knows to call the office should any new questions or concerns arise.    Melodye Ped, NP

## 2020-08-20 ENCOUNTER — Other Ambulatory Visit: Payer: Self-pay

## 2020-08-20 ENCOUNTER — Telehealth: Payer: Self-pay

## 2020-08-20 MED ORDER — MORPHINE SULFATE ER 30 MG PO TBCR: 30.0000 mg | EXTENDED_RELEASE_TABLET | Freq: Two times a day (BID) | ORAL | 0 refills | Status: DC

## 2020-08-20 NOTE — Progress Notes (Signed)
Chronic Care Management Pharmacy Assistant   Name: Linda [name]  MRN: [id number] DOB: [birth year]   Reason for Encounter: Medication coordination for [facility]   Recent office visits:  07/24/20-Dr [name] PCP, diabetes, labs ordered, Trulicity increased to [id number]/[id number], patient reported not taking Lorazapam and Compazine.  Lab results: Positive UDS and is c/w current medications.  Platelets still low. Recommend repeat cbc in 1 month.  Liver and kidney function normal.  Diabetes much improved. A1c 5.8.  Cholesterol at goal.  Tsh normal.   Recent consult visits:  08/19/20-Melissa Ronne Binning NP, cancer center, dehydration   Hospital visits:  04/10/20-admission for Malignant neoplasm of unspecified site of right female breast, Other acute osteomyelitis, right ankle and foot   Medications: Outpatient Encounter Medications as of 08/20/2020  Medication Sig Note   Blood Glucose Monitoring Suppl (ONETOUCH VERIO REFLECT) w/Device KIT AS DIRECTED    buPROPion (WELLBUTRIN XL) 300 MG 24 hr tablet Take 1 tablet (300 mg total) by mouth every evening.    busPIRone (BUSPAR) 5 MG tablet Take 1 tablet (5 mg total) by mouth 3 (three) times daily.    calcium citrate-vitamin D (CITRACAL+D) 315-200 MG-UNIT tablet Take 1 tablet by mouth 2 (two) times daily.    clotrimazole-betamethasone (LOTRISONE) cream Apply small amount to affected area twice daily    Continuous Blood Gluc Receiver (FREESTYLE LIBRE 2 READER) DEVI E11.69 Check blood sugar 4 times daily as directed    Continuous Blood Gluc Sensor (FREESTYLE LIBRE 2 SENSOR) MISC E11.69 Change sensor every 14 days as directed    cyclobenzaprine (FLEXERIL) 10 MG tablet TAKE ONE TABLET BY MOUTH EVERY 8 HOURS AS NEEDED FOR MUSCLE SPASMS    dicyclomine (BENTYL) 20 MG tablet TAKE ONE TABLET BY MOUTH BEFORE MEALS AND AT BEDTIME AS NEEDED FOR STOMACH CRAMPING    Dulaglutide (TRULICITY) 1.5 TF/5.7DU SOPN Inject 1.5 mg into the skin once a week.     Empagliflozin-metFORMIN HCl (SYNJARDY) 12.05-998 MG TABS Take 1 tablet by mouth 2 (two) times daily.    famotidine (PEPCID) 20 MG tablet Take 1 tablet (20 mg total) by mouth 2 (two) times daily.    ferrous sulfate 325 (65 FE) MG tablet Take 325 mg by mouth every evening.    gabapentin (NEURONTIN) 300 MG capsule Take 2 capsules (600 mg total) by mouth 3 (three) times daily.    Levomilnacipran HCl ER (FETZIMA) 80 MG CP24 Take 1 capsule by mouth daily.    levothyroxine (SYNTHROID) 75 MCG tablet Take 1 tablet (75 mcg total) by mouth daily.    loratadine (CLARITIN) 10 MG tablet Take 10 mg by mouth daily. Taking 3-4 days each week due to bone pain associated with shot from cancer center.    losartan (COZAAR) 50 MG tablet Take 1 tablet (50 mg total) by mouth every evening.    Magnesium 500 MG CAPS 500 mg.    morphine (MS CONTIN) 30 MG 12 hr tablet Take 1 tablet (30 mg total) by mouth every 12 (twelve) hours.    Multiple Vitamin (MULTIVITAMIN WITH MINERALS) TABS tablet Take 1 tablet by mouth daily.  06/08/2018: Pt plans on purchasing again when she can get out.   ondansetron (ZOFRAN) 4 MG tablet TAKE ONE TABLET BY MOUTH EVERY 4 HOURS AS NEEDED FOR NAUSEA    ondansetron (ZOFRAN) 4 MG tablet Take 1 tablet (4 mg total) by mouth every 4 (four) hours as needed for nausea.    OneTouch Delica Lancets 20U MISC 1 each by Does not apply route  daily before breakfast. Check blood sugar twice daily.    pantoprazole (PROTONIX) 40 MG tablet Take 1 tablet (40 mg total) by mouth 2 (two) times daily.    Pegfilgrastim-jmdb (FULPHILA [place]) Inject into the skin.    pravastatin (PRAVACHOL) 20 MG tablet Take 1 tablet (20 mg total) by mouth at bedtime.    prochlorperazine (COMPAZINE) 10 MG tablet Take 1 tablet (10 mg total) by mouth every 6 (six) hours as needed for nausea or vomiting.    rOPINIRole (REQUIP) 0.25 MG tablet TAKE ONE TABLET BY MOUTH EVERYDAY AT BEDTIME    senna (SENOKOT) 8.6 MG tablet Take 1 tablet by mouth daily as  needed for constipation.    SODIUM FLUORIDE 5000 SENSITIVE 1.1-5 % GEL Take by mouth 2 (two) times daily.    Specialty Vitamins Products (MAGNESIUM, AMINO ACID CHELATE,) 133 MG tablet Take 1 tablet by mouth at bedtime.    sucralfate (CARAFATE) 1 g tablet Take 1 tablet (1 g total) by mouth 4 (four) times daily -  with meals and at bedtime.    Vitamin D, Ergocalciferol, (DRISDOL) 1.25 MG (50000 UNIT) CAPS capsule TAKE ONE CAPSULE BY MOUTH ONCE WEEKLY ON FRIDAY    zolpidem (AMBIEN) 10 MG tablet TAKE ONE TABLET BY MOUTH EVERYDAY AT BEDTIME AS NEEDED    Facility-Administered Encounter Medications as of 08/20/2020  Medication   alteplase (CATHFLO ACTIVASE) injection 2 mg   sodium chloride flush (NS) 0.9 % injection 3 mL   Reviewed chart for medication changes ahead of medication coordination call.  No OVs, Consults, or hospital visits since last care coordination call/Pharmacist visit. (If appropriate, list visit date, provider name)  No medication changes indicated OR if recent visit, treatment plan here.  BP Readings from Last 3 Encounters:  08/19/20 (!) 145/65  07/24/20 110/70  05/20/20 131/65    Lab Results  Component Value Date   HGBA1C 5.8 (H) 07/24/2020     Patient obtains medications through Vials  30 Days   Last adherence delivery included:  Ropinirole - 0.25 mg- 1 tablet daily (bedtime) Synjardy- 12.5-1,000 mg - 1 tablet twice daily Pantoprazole -40 mg 1 tablet twice daily (1-before breakfast and 1- evening meal) Zolpidem- 10 mg- 1 tablet daily (bedtime) Levothyroxine 75 mcg- 1 tablet daily Gabapentin 300 mg -1 capsule 3 times daily Losartan 50 mg- 1 tablet daily (evening meal) Bupropion XL 300 mg -1 tablet daily (bedtime) Famotidine 20 mg- 1 tablet twice daily Fetzima. ER 80 mg- 1 capsule daily Pravastatin 20 mg- 1 tablet daily (bedtime) Potassium CL ER 20 meq- 2 caps twice daily * should be in capsule form Morphine ER 30 mg-  1 tablet every 12 hours Vitamin D 50,000  units- One capsule weekly (Friday) Freestyle kit 2 Sensor- Use as directed and change every 14 days.   Patient declined (meds) last month due to PRN use/additional supply on hand. Polyethylene glycol 3350- Dissolve 17 grams (one capful) and drink as directed once daily- Has 2 full containers on hand. Lantus solostar- inject 30 units daily (bedtime)- Dr. Tobie Poet discontinued Prochlorperazine - 10 mg- 1 tablet every 6 hours PRN nausea and vomiting- Has enough on hand Ondansetron 4 mg- 1 tablet every 4 hours prn nauesea- Has enough on hand  Patient is due for next adherence delivery on: .09/01/20 Called patient and reviewed medications and coordinated delivery.  This delivery to include: Ropinirole - 0.25 mg- 1 tablet daily (bedtime) Synjardy- 12.5-1,000 mg - 1 tablet twice daily Pantoprazole -40 mg 1 tablet twice daily (  1-before breakfast and 1- evening meal) Zolpidem- 10 mg- 1 tablet daily (bedtime) Levothyroxine 75 mcg- 1 tablet daily Gabapentin 300 mg -1 capsule 3 times daily Losartan 50 mg- 1 tablet daily (evening meal) Bupropion XL 300 mg -1 tablet daily (bedtime) Famotidine 20 mg- 1 tablet twice daily Fetzima. ER 80 mg- 1 capsule daily Pravastatin 20 mg- 1 tablet daily (bedtime) Potassium CL ER 20 meq- 2 caps twice daily * Lortrisone Cream -PRN should be in capsule form Morphine ER 30 mg-  1 capsule every 12 hours Vitamin D 50,000 units- One capsule weekly (Friday) Freestyle kit 2 Sensor- Use as directed and change every 14 days.   Patient declined the following medications (meds) due to (reason) Polyethylene glycol 3350- Dissolve 17 grams (one capful) and drink as directed once daily- Has 2 full containers on hand. Lantus solostar- inject 30 units daily (bedtime)- Dr. Tobie Poet discontinued Prochlorperazine - 10 mg- 1 tablet every 6 hours PRN nausea and vomiting- Has enough on hand Ondansetron 4 mg- 1 tablet every 4 hours prn nauesea- Has enough on hand  Patient needs refills for   Morphine 62m  .  Confirmed delivery date of 09/01/20, advised patient that pharmacy will contact them the morning of delivery.  TClarita Leber COaklandPharmacist Assistant [phone]

## 2020-08-25 ENCOUNTER — Encounter: Payer: Self-pay | Admitting: Sports Medicine

## 2020-08-25 ENCOUNTER — Other Ambulatory Visit: Payer: Self-pay

## 2020-08-25 ENCOUNTER — Ambulatory Visit: Payer: PPO | Admitting: Sports Medicine

## 2020-08-25 DIAGNOSIS — D6959 Other secondary thrombocytopenia: Secondary | ICD-10-CM | POA: Diagnosis not present

## 2020-08-25 DIAGNOSIS — E119 Type 2 diabetes mellitus without complications: Secondary | ICD-10-CM | POA: Diagnosis not present

## 2020-08-25 DIAGNOSIS — M79674 Pain in right toe(s): Secondary | ICD-10-CM | POA: Diagnosis not present

## 2020-08-25 DIAGNOSIS — M79675 Pain in left toe(s): Secondary | ICD-10-CM | POA: Diagnosis not present

## 2020-08-25 DIAGNOSIS — B351 Tinea unguium: Secondary | ICD-10-CM

## 2020-08-25 DIAGNOSIS — Z8739 Personal history of other diseases of the musculoskeletal system and connective tissue: Secondary | ICD-10-CM

## 2020-08-25 DIAGNOSIS — T451X5A Adverse effect of antineoplastic and immunosuppressive drugs, initial encounter: Secondary | ICD-10-CM | POA: Diagnosis not present

## 2020-08-25 NOTE — Progress Notes (Signed)
Subjective: Linda [name] is a 50 y.o. female patient with history of diabetes who presents to office today complaining of long,mildly painful nails  while ambulating in shoes; unable to trim. Patient states that toe ulcer is still healed no problems at the left second toe does admit that her right great toenail came off. Patient reports her glucose reading this morning was 126 mg/dl.  Last A1c 5 and last visit to PCP Dr. Tobie Poet 1 month ago.  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 04/17/2020   Osteomyelitis ([place]) 04/17/2020   Diabetic ulcer of toe of left foot associated with type 2 diabetes mellitus, with fat layer exposed ([place]) 03/27/2020   Dehydration 03/16/2020   Hypokalemia 03/09/2020   Chemotherapy-induced thrombocytopenia 02/06/2020   Thrombocytopenia, unspecified ([place]) 01/27/2020   Malignant neoplasm of lower-inner quadrant of right breast of female, estrogen receptor negative ([place]) 10/01/2019   Chronic pain syndrome 07/08/2019   Memory loss 07/08/2019   Depression, major, recurrent, mild ([place]) 31/54/0086   Uncomplicated opioid dependence ([place]) 07/08/2019   Mixed hyperlipidemia 04/11/2019   Dyslipidemia associated with type 2 diabetes mellitus ([place]) 04/11/2019   Essential hypertension, benign 04/11/2019   Major depressive disorder, single episode, mild ([place]) 04/11/2019   Acquired hypothyroidism 04/11/2019   Vitamin D insufficiency 04/11/2019   Class 2 severe obesity due to excess calories with serious comorbidity and body mass index (BMI) of 35.0 to 35.9 in adult ([place]) 04/11/2019   Pre-ulcerative calluses 03/04/2019   Liver cirrhosis secondary to NASH (nonalcoholic steatohepatitis) ([place]) 09/13/2018    Class: Chronic   BRCA1 positive 06/14/2016   Current Outpatient Medications on File Prior to Visit  Medication Sig  Dispense Refill   Blood Glucose Monitoring Suppl (ONETOUCH VERIO REFLECT) w/Device KIT AS DIRECTED     buPROPion (WELLBUTRIN XL) 300 MG 24 hr tablet Take 1 tablet (300 mg total) by mouth every evening. 90 tablet 1   busPIRone (BUSPAR) 5 MG tablet Take 1 tablet (5 mg total) by mouth 3 (three) times daily. 90 tablet 2   calcium citrate-vitamin D (CITRACAL+D) 315-200 MG-UNIT tablet Take 1 tablet by mouth 2 (two) times daily.     clotrimazole-betamethasone (LOTRISONE) cream Apply small amount to affected area twice daily 45 g 0   Continuous Blood Gluc Receiver (FREESTYLE LIBRE 2 READER) DEVI E11.69 Check blood sugar 4 times daily as directed 1 each 0   Continuous Blood Gluc Sensor (FREESTYLE LIBRE 2 SENSOR) MISC E11.69 Change sensor every 14 days as directed 6 each 3   cyclobenzaprine (FLEXERIL) 10 MG tablet TAKE ONE TABLET BY MOUTH EVERY 8 HOURS AS NEEDED FOR MUSCLE SPASMS 30 tablet 3   dicyclomine (BENTYL) 20 MG tablet TAKE ONE TABLET BY MOUTH BEFORE MEALS AND AT BEDTIME AS NEEDED FOR STOMACH CRAMPING 180 tablet 1   Dulaglutide (TRULICITY) 1.5 PY/1.9JK SOPN Inject 1.5 mg into the skin once a week. 2 mL 2   Empagliflozin-metFORMIN HCl (SYNJARDY) 12.05-998 MG TABS Take 1 tablet by mouth 2 (two) times daily. 180 tablet 0   famotidine (PEPCID) 20 MG tablet Take 1 tablet (20 mg total) by mouth 2 (two) times daily. 180 tablet 1   ferrous sulfate 325 (65 FE) MG tablet Take 325 mg by mouth every evening.     gabapentin (NEURONTIN) 300 MG capsule Take 2 capsules (600 mg total) by mouth 3 (three) times daily. [place]  capsule 2   Levomilnacipran HCl ER (FETZIMA) 80 MG CP24 Take 1 capsule by mouth daily. 90 capsule 1   levothyroxine (SYNTHROID) 75 MCG tablet Take 1 tablet (75 mcg total) by mouth daily. 90 tablet 1   loratadine (CLARITIN) 10 MG tablet Take 10 mg by mouth daily. Taking 3-4 days each week due to bone pain associated with shot from cancer center.     losartan (COZAAR) 50 MG tablet Take 1 tablet (50 mg  total) by mouth every evening. 90 tablet 1   Magnesium 500 MG CAPS 500 mg.     morphine (MS CONTIN) 30 MG 12 hr tablet Take 1 tablet (30 mg total) by mouth every 12 (twelve) hours. 60 tablet 0   Multiple Vitamin (MULTIVITAMIN WITH MINERALS) TABS tablet Take 1 tablet by mouth daily.      ondansetron (ZOFRAN) 4 MG tablet TAKE ONE TABLET BY MOUTH EVERY 4 HOURS AS NEEDED FOR NAUSEA 90 tablet 0   ondansetron (ZOFRAN) 4 MG tablet Take 1 tablet (4 mg total) by mouth every 4 (four) hours as needed for nausea. 90 tablet 3   OneTouch Delica Lancets 69S MISC 1 each by Does not apply route daily before breakfast. Check blood sugar twice daily. 100 each 3   pantoprazole (PROTONIX) 40 MG tablet Take 1 tablet (40 mg total) by mouth 2 (two) times daily. 180 tablet 1   Pegfilgrastim-jmdb (FULPHILA [place]) Inject into the skin.     pravastatin (PRAVACHOL) 20 MG tablet Take 1 tablet (20 mg total) by mouth at bedtime. 90 tablet 1   prochlorperazine (COMPAZINE) 10 MG tablet Take 1 tablet (10 mg total) by mouth every 6 (six) hours as needed for nausea or vomiting. 90 tablet 3   rOPINIRole (REQUIP) 0.25 MG tablet TAKE ONE TABLET BY MOUTH EVERYDAY AT BEDTIME 30 tablet 2   senna (SENOKOT) 8.6 MG tablet Take 1 tablet by mouth daily as needed for constipation.     SODIUM FLUORIDE 5000 SENSITIVE 1.1-5 % GEL Take by mouth 2 (two) times daily.     Specialty Vitamins Products (MAGNESIUM, AMINO ACID CHELATE,) 133 MG tablet Take 1 tablet by mouth at bedtime.     sucralfate (CARAFATE) 1 g tablet Take 1 tablet (1 g total) by mouth 4 (four) times daily -  with meals and at bedtime. 270 tablet 1   Vitamin D, Ergocalciferol, (DRISDOL) 1.25 MG (50000 UNIT) CAPS capsule TAKE ONE CAPSULE BY MOUTH ONCE WEEKLY ON FRIDAY 12 capsule 1   zolpidem (AMBIEN) 10 MG tablet TAKE ONE TABLET BY MOUTH EVERYDAY AT BEDTIME AS NEEDED 30 tablet 5   Current Facility-Administered Medications on File Prior to Visit  Medication Dose Route Frequency Provider Last  Rate Last Admin   alteplase (CATHFLO ACTIVASE) injection 2 mg  2 mg Intracatheter Once PRN [place] Scrape A, NP       sodium chloride flush (NS) 0.9 % injection 3 mL  3 mL Intracatheter Once PRN [place] Scrape A, NP       Allergies  Allergen Reactions   Codeine Shortness Of Breath    Other reaction(s): SHOB   Celecoxib Other (See Comments)    Unknown reaction Other reaction(s): Unknown   Ezetimibe     Other reaction(s): Unknown   Ezetimibe-Simvastatin Other (See Comments)    Unknown reaction   Propranolol     Other reaction(s): Unknown   Propranolol Hcl Other (See Comments)    Unknown reaction   Simvastatin     Other reaction(s): Unknown  Recent Results (from the past 2160 hour(s))  CBC with Differential/Platelet     Status: Abnormal   Collection Time: 07/24/20 10:57 AM  Result Value Ref Range   WBC 5.9 3.4 - 10.8 x10E3/uL   RBC 4.28 3.77 - 5.28 x10E6/uL   Hemoglobin 12.2 11.1 - 15.9 g/dL   Hematocrit 37.6 34.0 - 46.6 %   MCV 88 79 - 97 fL   MCH 28.5 26.6 - 33.0 pg   MCHC 32.4 31.5 - 35.7 g/dL   RDW 13.6 11.7 - 15.4 %   Platelets 89 (LL) 150 - 450 x10E3/uL   Neutrophils 67 Not Estab. %   Lymphs 22 Not Estab. %   Monocytes 8 Not Estab. %   Eos 2 Not Estab. %   Basos 1 Not Estab. %   Neutrophils Absolute 4.0 1.4 - 7.0 x10E3/uL   Lymphocytes Absolute 1.3 0.7 - 3.1 x10E3/uL   Monocytes Absolute 0.5 0.1 - 0.9 x10E3/uL   EOS (ABSOLUTE) 0.1 0.0 - 0.4 x10E3/uL   Basophils Absolute 0.0 0.0 - 0.2 x10E3/uL   Immature Granulocytes 0 Not Estab. %   Immature Grans (Abs) 0.0 0.0 - 0.1 x10E3/uL   Hematology Comments: Note:     Comment: Verified by microscopic examination.  Comprehensive metabolic panel     Status: Abnormal   Collection Time: 07/24/20 10:57 AM  Result Value Ref Range   Glucose 114 (H) 65 - 99 mg/dL   BUN 15 6 - 24 mg/dL   Creatinine, Ser 0.99 0.57 - 1.00 mg/dL   eGFR 69 >59 mL/min/1.73   BUN/Creatinine Ratio 15 9 - 23   Sodium 140 134 - 144 mmol/L    Potassium 4.0 3.5 - 5.2 mmol/L   Chloride 98 96 - 106 mmol/L   CO2 26 20 - 29 mmol/L   Calcium 9.4 8.7 - 10.2 mg/dL   Total Protein 6.2 6.0 - 8.5 g/dL   Albumin 4.3 3.8 - 4.9 g/dL   Globulin, Total 1.9 1.5 - 4.5 g/dL   Albumin/Globulin Ratio 2.3 (H) 1.2 - 2.2   Bilirubin Total 0.5 0.0 - 1.2 mg/dL   Alkaline Phosphatase 76 44 - 121 IU/L   AST 16 0 - 40 IU/L   ALT 20 0 - 32 IU/L  Hemoglobin A1c     Status: Abnormal   Collection Time: 07/24/20 10:57 AM  Result Value Ref Range   Hgb A1c MFr Bld 5.8 (H) 4.8 - 5.6 %    Comment:          Prediabetes: 5.7 - 6.4          Diabetes: >6.4          Glycemic control for adults with diabetes: <7.0    Est. average glucose Bld gHb Est-mCnc 120 mg/dL  Lipid panel     Status: None   Collection Time: 07/24/20 10:57 AM  Result Value Ref Range   Cholesterol, Total 165 100 - 199 mg/dL   Triglycerides 126 0 - 149 mg/dL   HDL 57 >39 mg/dL   VLDL Cholesterol Cal 22 5 - 40 mg/dL   LDL Chol Calc (NIH) 86 0 - 99 mg/dL   Chol/HDL Ratio 2.9 0.0 - 4.4 ratio    Comment:                                   T. Chol/HDL Ratio  Men  Women                               1/2 Avg.Risk  3.4    3.3                                   Avg.Risk  5.0    4.4                                2X Avg.Risk  9.6    7.1                                3X Avg.Risk 23.4   11.0   TSH     Status: None   Collection Time: 07/24/20 10:57 AM  Result Value Ref Range   TSH 1.440 0.450 - 4.500 uIU/mL  Cardiovascular Risk Assessment     Status: None   Collection Time: 07/24/20 10:57 AM  Result Value Ref Range   Interpretation Note     Comment: Supplemental report is available.  Pain Mgt Scrn (14 Drugs), Ur     Status: Abnormal   Collection Time: 07/24/20 11:00 AM  Result Value Ref Range   Amphetamine Scrn, Ur Negative Cutoff=1000 ng/mL   BARBITURATE SCREEN URINE Negative Cutoff=200 ng/mL   BENZODIAZEPINE SCREEN, URINE Negative Cutoff=200 ng/mL    CANNABINOIDS UR QL SCN Negative Cutoff=20 ng/mL   Cocaine (Metab) Scrn, Ur Negative Cutoff=300 ng/mL   Opiate Scrn, Ur Positive (A) Cutoff=300 ng/mL    Comment: Opiate test includes Codeine, Morphine, Hydromorphone, Hydrocodone.   OXYCODONE+OXYMORPHONE UR QL SCN Negative Cutoff=100 ng/mL    Comment: Test includes Oxycodone and Oxymorphone   Phencyclidine Qn, Ur Negative Cutoff=25 ng/mL   Methadone Screen, Urine Negative Cutoff=300 ng/mL   Propoxyphene Scrn, Ur Negative Cutoff=300 ng/mL   Meperidine Screen, Urine Negative Cutoff=200 ng/mL    Comment: This test was developed and its performance characteristics determined by [facility]. It has not been cleared or approved by the Food and Drug Administration.    Fentanyl, Urine Negative Cutoff=2000 pg/mL    Comment: Test includes Fentanyl and Norfentanyl This test was developed and its performance characteristics determined by [facility]. It has not been cleared or approved by the Food and Drug Administration.    Tramadol Screen, Urine Negative Cutoff=200 ng/mL   Buprenorphine, Urine Negative Cutoff=10 ng/mL   Creatinine(Crt), U 127.0 20.0 - 300.0 mg/dL   Ph of Urine 5.4 4.5 - 8.9   PLEASE NOTE: Comment     Comment: This assay provides a preliminary unconfirmed analytical test result that may be suitable for clinical management of patients in certain situations. Drug-test results should be interpreted in the context of clinical information. Patient metabolic variables, specific drug chemistry, and specimen characteristics can affect test outcome. Technical consultation is available if a test result is inconsistent with an expected outcome. ([email or web address] or call toll-free [phone])   CBC and differential     Status: Abnormal   Collection Time: 08/19/20 12:00 AM  Result Value Ref Range   Hemoglobin 11.6 (A) 12.0 - 16.0   HCT 36 36 - 46   Neutrophils Absolute 3.94    Platelets 79 (A) 150 - 399   WBC 5.4   CBC  Status: None   Collection Time: 08/19/20 12:00 AM  Result Value Ref Range   RBC 4.17 3.87 - 1.51  Basic metabolic panel     Status: Abnormal   Collection Time: 08/19/20 12:00 AM  Result Value Ref Range   Glucose 119    BUN 18 4 - 21   CO2 32 (A) 13 - 22   Creatinine 1.0 0.5 - 1.1   Potassium 4.1 3.4 - 5.3   Sodium 139 137 - 147   Chloride 99 99 - 108  Comprehensive metabolic panel     Status: None   Collection Time: 08/19/20 12:00 AM  Result Value Ref Range   Calcium 9.6 8.7 - 10.7   Albumin 4.1 3.5 - 5.0  Hepatic function panel     Status: None   Collection Time: 08/19/20 12:00 AM  Result Value Ref Range   Alkaline Phosphatase 71 25 - 125   ALT 16 7 - 35   AST 21 13 - 35   Bilirubin, Total 0.7     Objective: General: Patient is awake, alert, and oriented x 3 and in no acute distress.  Integument: Skin is warm, dry and supple bilateral. Nails are tender, long, thickened and dystrophic with subungual debris, consistent with onychomycosis, 1-5 bilateral. No signs of infection.  Dried blood noted at the distal tip of the right hallux nail with no signs of infection.  No open lesions or preulcerative lesions present bilateral. Remaining integument unremarkable. Previous left second toe ulcer healed.  Vasculature:  Dorsalis Pedis pulse 1/4 bilateral. Posterior Tibial pulse  1/4 bilateral. Capillary fill time <3 sec 1-5 bilateral. Positive hair growth to the level of the digits.Temperature gradient within normal limits. No varicosities present bilateral. No edema present bilateral.   Neurology: The patient has diminished sensation measured with a 5.07/10g Semmes Weinstein Monofilament at all pedal sites bilateral.  Musculoskeletal:Asymptomatic hammertoe pedal deformities noted bilateral. Muscular strength 5/5 in all lower extremity muscular groups bilateral without pain on range of motion . No tenderness with calf compression bilateral.  Assessment and Plan: Problem List Items  Addressed This Visit       Hematopoietic and Hemostatic   Chemotherapy-induced thrombocytopenia   Other Visit Diagnoses     Pain due to onychomycosis of toenails of both feet    -  Primary   History of osteomyelitis       Diabetes mellitus without complication ([place])           -Examined patient. -Discussed and educated patient on diabetic foot care, especially with  regards to the vascular, neurological and musculoskeletal systems.  -Stressed the importance of good glycemic control and the detriment of not  controlling glucose levels in relation to the foot. -Mechanically debrided all nails 1-5 bilateral using sterile nail nipper and filed with dremel without incident  -Advised patient to closely monitor left second toe return to office sooner if area worsen or fails to continue to improve -Answered all patient questions -Patient to return  in 2.5-3 months for at risk foot care -Patient advised to call the office if any problems or questions arise in the meantime.  Landis Martins, DPM

## 2020-08-26 ENCOUNTER — Encounter: Payer: Self-pay | Admitting: Hematology and Oncology

## 2020-08-27 ENCOUNTER — Other Ambulatory Visit: Payer: Self-pay

## 2020-08-27 ENCOUNTER — Other Ambulatory Visit: Payer: Self-pay | Admitting: Family Medicine

## 2020-08-27 DIAGNOSIS — Z1501 Genetic susceptibility to malignant neoplasm of breast: Secondary | ICD-10-CM

## 2020-08-27 DIAGNOSIS — Z1509 Genetic susceptibility to other malignant neoplasm: Secondary | ICD-10-CM

## 2020-08-27 MED ORDER — CLOTRIMAZOLE-BETAMETHASONE 1-0.05 % EX CREA: TOPICAL_CREAM | CUTANEOUS | 0 refills | Status: DC

## 2020-08-27 NOTE — Progress Notes (Signed)
Requested refill for Lotrisone cream be sent to [facility]

## 2020-08-28 ENCOUNTER — Other Ambulatory Visit: Payer: PPO

## 2020-09-07 ENCOUNTER — Telehealth: Payer: Self-pay

## 2020-09-07 NOTE — Chronic Care Management (AMB) (Signed)
Error

## 2020-09-13 ENCOUNTER — Encounter: Payer: Self-pay | Admitting: Hematology and Oncology

## 2020-09-13 ENCOUNTER — Encounter: Payer: Self-pay | Admitting: Oncology

## 2020-09-16 ENCOUNTER — Other Ambulatory Visit: Payer: Self-pay | Admitting: Family Medicine

## 2020-09-16 DIAGNOSIS — G2581 Restless legs syndrome: Secondary | ICD-10-CM

## 2020-09-16 DIAGNOSIS — Z1501 Genetic susceptibility to malignant neoplasm of breast: Secondary | ICD-10-CM

## 2020-09-21 ENCOUNTER — Telehealth: Payer: Self-pay

## 2020-09-21 NOTE — Chronic Care Management (AMB) (Signed)
Chronic Care Management Pharmacy Assistant   Name: Linda [name]  MRN: [id number] DOB: August 08, 1970  Reason for Encounter: Medication Coordination  Recent office visits:  No visits noted  Recent consult visits:  08/25/20- Linda [name], DPM (Podiatry)- seen for pain due to onychomycosis of toenails of both feet, debridement performed, no medication changes, follow up 11 weeks  Hospital visits:  None in previous 6 months  Medications: Outpatient Encounter Medications as of 09/21/2020  Medication Sig Note   Blood Glucose Monitoring Suppl (ONETOUCH VERIO REFLECT) w/Device KIT AS DIRECTED    buPROPion (WELLBUTRIN XL) 300 MG 24 hr tablet Take 1 tablet (300 mg total) by mouth every evening.    busPIRone (BUSPAR) 5 MG tablet Take 1 tablet (5 mg total) by mouth 3 (three) times daily.    calcium citrate-vitamin D (CITRACAL+D) 315-200 MG-UNIT tablet Take 1 tablet by mouth 2 (two) times daily.    clotrimazole-betamethasone (LOTRISONE) cream Apply small amount to affected area twice daily    Continuous Blood Gluc Receiver (FREESTYLE LIBRE 2 READER) DEVI E11.69 Check blood sugar 4 times daily as directed    Continuous Blood Gluc Sensor (FREESTYLE LIBRE 2 SENSOR) MISC E11.69 Change sensor every 14 days as directed    cyclobenzaprine (FLEXERIL) 10 MG tablet TAKE ONE TABLET BY MOUTH EVERY 8 HOURS AS NEEDED FOR MUSCLE SPASMS    dicyclomine (BENTYL) 20 MG tablet TAKE ONE TABLET BY MOUTH BEFORE MEALS AND AT BEDTIME AS NEEDED FOR STOMACH CRAMPING    Dulaglutide (TRULICITY) 1.5 FS/1.4EL SOPN Inject 1.5 mg into the skin once a week.    famotidine (PEPCID) 20 MG tablet Take 1 tablet (20 mg total) by mouth 2 (two) times daily.    ferrous sulfate 325 (65 FE) MG tablet Take 325 mg by mouth every evening.    gabapentin (NEURONTIN) 300 MG capsule Take 2 capsules (600 mg total) by mouth 3 (three) times daily.    Levomilnacipran HCl ER (FETZIMA) 80 MG CP24 Take 1 capsule by mouth daily.    levothyroxine  (SYNTHROID) 75 MCG tablet Take 1 tablet (75 mcg total) by mouth daily.    loratadine (CLARITIN) 10 MG tablet Take 10 mg by mouth daily. Taking 3-4 days each week due to bone pain associated with shot from cancer center.    losartan (COZAAR) 50 MG tablet Take 1 tablet (50 mg total) by mouth every evening.    Magnesium 500 MG CAPS 500 mg.    morphine (MS CONTIN) 30 MG 12 hr tablet Take 1 tablet (30 mg total) by mouth every 12 (twelve) hours.    Multiple Vitamin (MULTIVITAMIN WITH MINERALS) TABS tablet Take 1 tablet by mouth daily.  06/08/2018: Pt plans on purchasing again when she can get out.   ondansetron (ZOFRAN) 4 MG tablet TAKE ONE TABLET BY MOUTH EVERY 4 HOURS AS NEEDED FOR NAUSEA    ondansetron (ZOFRAN) 4 MG tablet Take 1 tablet (4 mg total) by mouth every 4 (four) hours as needed for nausea.    OneTouch Delica Lancets 95V MISC 1 each by Does not apply route daily before breakfast. Check blood sugar twice daily.    pantoprazole (PROTONIX) 40 MG tablet Take 1 tablet (40 mg total) by mouth 2 (two) times daily.    Pegfilgrastim-jmdb (FULPHILA [name]) Inject into the skin.    pravastatin (PRAVACHOL) 20 MG tablet Take 1 tablet (20 mg total) by mouth at bedtime.    prochlorperazine (COMPAZINE) 10 MG tablet Take 1 tablet (10 mg total) by mouth  every 6 (six) hours as needed for nausea or vomiting.    rOPINIRole (REQUIP) 0.25 MG tablet TAKE ONE TABLET BY MOUTH EVERYDAY AT BEDTIME    senna (SENOKOT) 8.6 MG tablet Take 1 tablet by mouth daily as needed for constipation.    SODIUM FLUORIDE 5000 SENSITIVE 1.1-5 % GEL Take by mouth 2 (two) times daily.    Specialty Vitamins Products (MAGNESIUM, AMINO ACID CHELATE,) 133 MG tablet Take 1 tablet by mouth at bedtime.    sucralfate (CARAFATE) 1 g tablet Take 1 tablet (1 g total) by mouth 4 (four) times daily -  with meals and at bedtime.    SYNJARDY 12.05-998 MG TABS TAKE ONE TABLET BY MOUTH TWICE DAILY    Vitamin D, Ergocalciferol, (DRISDOL) 1.25 MG (50000 UNIT)  CAPS capsule TAKE ONE CAPSULE BY MOUTH ONCE WEEKLY ON FRIDAY    zolpidem (AMBIEN) 10 MG tablet TAKE ONE TABLET BY MOUTH EVERYDAY AT BEDTIME AS NEEDED    No facility-administered encounter medications on file as of 09/21/2020.    Reviewed chart for medication changes ahead of medication coordination call.  No OVs, Consults, or hospital visits since last care coordination call/Pharmacist visit. (If appropriate, list visit date, provider name)  No medication changes indicated OR if recent visit, treatment plan here.  BP Readings from Last 3 Encounters:  08/19/20 (!) 145/65  07/24/20 110/70  05/20/20 131/65    Lab Results  Component Value Date   HGBA1C 5.8 (H) 07/24/2020    Several unsuccessful attempts made to contact patient   Patient obtains medications through      Last adherence delivery included:  Ropinirole - 0.25 mg- 1 tablet daily (bedtime) Synjardy- 12.5-1,000 mg - 1 tablet twice daily Pantoprazole -40 mg 1 tablet twice daily (1-before breakfast and 1- evening meal) Zolpidem- 10 mg- 1 tablet daily (bedtime) Levothyroxine 75 mcg- 1 tablet daily Gabapentin 300 mg -1 capsule 3 times daily Losartan 50 mg- 1 tablet daily (evening meal) Bupropion XL 300 mg -1 tablet daily (bedtime) Famotidine 20 mg- 1 tablet twice daily Fetzima. ER 80 mg- 1 capsule daily Pravastatin 20 mg- 1 tablet daily (bedtime) Potassium CL ER 20 meq- 2 caps twice daily * Lortrisone Cream -PRN should be in capsule form Morphine ER 30 mg-  1 capsule every 12 hours Vitamin D 50,000 units- One capsule weekly (Friday) Freestyle kit 2 Sensor- Use as directed and change every 14 days.    Patient declined last month due to  Polyethylene glycol 3350- Dissolve 17 grams (one capful) and drink as directed once daily- Has 2 full containers on hand. Lantus solostar- inject 30 units daily (bedtime)- Dr. Tobie Poet discontinued Prochlorperazine - 10 mg- 1 tablet every 6 hours PRN nausea and vomiting- Has enough on  hand Ondansetron 4 mg- 1 tablet every 4 hours prn nauesea- Has enough on hand  Patient is due for next adherence delivery on: Marland Kitchen Called patient and reviewed medications and coordinated delivery.  This delivery to include: Patient will need a short fill of (med), prior to adherence delivery. (To align with sync date or if PRN med)  Coordinated acute fill for (med) to be delivered (date).  Patient declined the following medications (meds) due to (reason)  Patient needs refills for .  Confirmed delivery date of , advised patient that pharmacy will contact them the morning of delivery.  Wilford Sports CPA, CMA

## 2020-09-29 ENCOUNTER — Telehealth: Payer: Self-pay

## 2020-09-29 ENCOUNTER — Other Ambulatory Visit: Payer: Self-pay

## 2020-09-29 DIAGNOSIS — G2581 Restless legs syndrome: Secondary | ICD-10-CM

## 2020-09-29 DIAGNOSIS — Z1509 Genetic susceptibility to other malignant neoplasm: Secondary | ICD-10-CM

## 2020-09-29 DIAGNOSIS — Z1501 Genetic susceptibility to malignant neoplasm of breast: Secondary | ICD-10-CM

## 2020-09-29 MED ORDER — MORPHINE SULFATE ER 30 MG PO TBCR: 30.0000 mg | EXTENDED_RELEASE_TABLET | Freq: Two times a day (BID) | ORAL | 0 refills | Status: DC

## 2020-09-29 MED ORDER — SYNJARDY 12.5-1000 MG PO TABS: 1.0000 | ORAL_TABLET | Freq: Two times a day (BID) | ORAL | 0 refills | Status: DC

## 2020-09-29 NOTE — Chronic Care Management (AMB) (Addendum)
Chronic Care Management Pharmacy Assistant   Name: Linda [name]  MRN: [id number] DOB: April 11, 1970  Reason for Encounter: Medication Coordination   Recent office visits:  No visits noted   Recent consult visits:  08/25/20- Landis Martins, DPM (Podiatry)- seen for pain due to onychomycosis of toenails of both feet, debridement performed, no medication changes, follow up 11 weeks   Hospital visits:  None in previous 6 months    Medications: Outpatient Encounter Medications as of 09/29/2020  Medication Sig Note   Blood Glucose Monitoring Suppl (ONETOUCH VERIO REFLECT) w/Device KIT AS DIRECTED    buPROPion (WELLBUTRIN XL) 300 MG 24 hr tablet Take 1 tablet (300 mg total) by mouth every evening.    busPIRone (BUSPAR) 5 MG tablet Take 1 tablet (5 mg total) by mouth 3 (three) times daily.    calcium citrate-vitamin D (CITRACAL+D) 315-200 MG-UNIT tablet Take 1 tablet by mouth 2 (two) times daily.    clotrimazole-betamethasone (LOTRISONE) cream Apply small amount to affected area twice daily    Continuous Blood Gluc Receiver (FREESTYLE LIBRE 2 READER) DEVI E11.69 Check blood sugar 4 times daily as directed    Continuous Blood Gluc Sensor (FREESTYLE LIBRE 2 SENSOR) MISC E11.69 Change sensor every 14 days as directed    cyclobenzaprine (FLEXERIL) 10 MG tablet TAKE ONE TABLET BY MOUTH EVERY 8 HOURS AS NEEDED FOR MUSCLE SPASMS    dicyclomine (BENTYL) 20 MG tablet TAKE ONE TABLET BY MOUTH BEFORE MEALS AND AT BEDTIME AS NEEDED FOR STOMACH CRAMPING    Dulaglutide (TRULICITY) 1.5 QJ/3.3LK SOPN Inject 1.5 mg into the skin once a week.    famotidine (PEPCID) 20 MG tablet Take 1 tablet (20 mg total) by mouth 2 (two) times daily.    ferrous sulfate 325 (65 FE) MG tablet Take 325 mg by mouth every evening.    gabapentin (NEURONTIN) 300 MG capsule Take 2 capsules (600 mg total) by mouth 3 (three) times daily.    Levomilnacipran HCl ER (FETZIMA) 80 MG CP24 Take 1 capsule by mouth daily.     levothyroxine (SYNTHROID) 75 MCG tablet Take 1 tablet (75 mcg total) by mouth daily.    loratadine (CLARITIN) 10 MG tablet Take 10 mg by mouth daily. Taking 3-4 days each week due to bone pain associated with shot from cancer center.    losartan (COZAAR) 50 MG tablet Take 1 tablet (50 mg total) by mouth every evening.    Magnesium 500 MG CAPS 500 mg.    morphine (MS CONTIN) 30 MG 12 hr tablet Take 1 tablet (30 mg total) by mouth every 12 (twelve) hours.    Multiple Vitamin (MULTIVITAMIN WITH MINERALS) TABS tablet Take 1 tablet by mouth daily.  06/08/2018: Pt plans on purchasing again when she can get out.   ondansetron (ZOFRAN) 4 MG tablet TAKE ONE TABLET BY MOUTH EVERY 4 HOURS AS NEEDED FOR NAUSEA    ondansetron (ZOFRAN) 4 MG tablet Take 1 tablet (4 mg total) by mouth every 4 (four) hours as needed for nausea.    OneTouch Delica Lancets 56Y MISC 1 each by Does not apply route daily before breakfast. Check blood sugar twice daily.    pantoprazole (PROTONIX) 40 MG tablet Take 1 tablet (40 mg total) by mouth 2 (two) times daily.    Pegfilgrastim-jmdb (FULPHILA [place]) Inject into the skin.    pravastatin (PRAVACHOL) 20 MG tablet Take 1 tablet (20 mg total) by mouth at bedtime.    prochlorperazine (COMPAZINE) 10 MG tablet Take 1 tablet (  10 mg total) by mouth every 6 (six) hours as needed for nausea or vomiting.    rOPINIRole (REQUIP) 0.25 MG tablet TAKE ONE TABLET BY MOUTH EVERYDAY AT BEDTIME    senna (SENOKOT) 8.6 MG tablet Take 1 tablet by mouth daily as needed for constipation.    SODIUM FLUORIDE 5000 SENSITIVE 1.1-5 % GEL Take by mouth 2 (two) times daily.    Specialty Vitamins Products (MAGNESIUM, AMINO ACID CHELATE,) 133 MG tablet Take 1 tablet by mouth at bedtime.    sucralfate (CARAFATE) 1 g tablet Take 1 tablet (1 g total) by mouth 4 (four) times daily -  with meals and at bedtime.    SYNJARDY 12.05-998 MG TABS TAKE ONE TABLET BY MOUTH TWICE DAILY    Vitamin D, Ergocalciferol, (DRISDOL) 1.25 MG  (50000 UNIT) CAPS capsule TAKE ONE CAPSULE BY MOUTH ONCE WEEKLY ON FRIDAY    zolpidem (AMBIEN) 10 MG tablet TAKE ONE TABLET BY MOUTH EVERYDAY AT BEDTIME AS NEEDED    No facility-administered encounter medications on file as of 09/29/2020.    Patient obtains medications through Vials 30 day supply   Last adherence delivery included:  Ropinirole - 0.25 mg- 1 tablet daily (bedtime) Synjardy- 12.5-1,000 mg - 1 tablet twice daily Pantoprazole -40 mg 1 tablet twice daily (1-before breakfast and 1- evening meal) Zolpidem- 10 mg- 1 tablet daily (bedtime) Levothyroxine 75 mcg- 1 tablet daily Gabapentin 300 mg -1 capsule 3 times daily Losartan 50 mg- 1 tablet daily (evening meal) Bupropion XL 300 mg -1 tablet daily (bedtime) Famotidine 20 mg- 1 tablet twice daily Fetzima. ER 80 mg- 1 capsule daily Pravastatin 20 mg- 1 tablet daily (bedtime) Potassium CL ER 20 meq- 2 caps twice daily * Lortrisone Cream -PRN should be in capsule form Morphine ER 30 mg-  1 capsule every 12 hours Vitamin D 50,000 units- One capsule weekly (Friday) Freestyle kit 2 Sensor- Use as directed and change every 14 days.    Patient declined last month due to  Polyethylene glycol 3350- Dissolve 17 grams (one capful) and drink as directed once daily- Has 2 full containers on hand. Lantus solostar- inject 30 units daily (bedtime)- Dr. Tobie Poet discontinued Prochlorperazine - 10 mg- 1 tablet every 6 hours PRN nausea and vomiting- Has enough on hand Ondansetron 4 mg- 1 tablet every 4 hours prn nauesea- Has enough on hand   Patient is due for next adherence delivery on: 09/30/20 Called patient and reviewed medications and coordinated delivery.   This delivery to include: Ropinirole - 0.25 mg- 1 tablet daily (bedtime) Synjardy- 12.5-1,000 mg - 1 tablet twice daily Pantoprazole -40 mg 1 tablet twice daily (1-before breakfast and 1- evening meal) Zolpidem- 10 mg- 1 tablet daily (bedtime) Levothyroxine 75 mcg- 1 tablet  daily Gabapentin 300 mg -1 capsule 3 times daily Losartan 50 mg- 1 tablet daily (evening meal) Bupropion XL 300 mg -1 tablet daily (bedtime) Famotidine 20 mg- 1 tablet twice daily Fetzima. ER 80 mg- 1 capsule daily Pravastatin 20 mg- 1 tablet daily (bedtime) Potassium CL ER 20 meq- 2 caps twice daily * Buspirone 5 mg- 1 tab 3 times daily  Morphine ER 30 mg-  1 capsule every 12 hours Vitamin D 50,000 units- One capsule weekly (Friday) Freestyle kit 2 Sensor- Use as directed and change every 14 days.  Trulicity Inject 1.5 mg into the skin once a week   Patient needs refills for .  Ropinirole - 0.25 mg- 1 tablet daily (bedtime) Synjardy Morphine ER  Zolpidem  Confirmed  delivery date of 09/30/20, advised patient that pharmacy will contact them the morning of delivery.   Wilford Sports CPA, CMA

## 2020-09-30 ENCOUNTER — Other Ambulatory Visit: Payer: Self-pay

## 2020-09-30 MED ORDER — POTASSIUM CHLORIDE CRYS ER 10 MEQ PO TBCR: 20.0000 meq | EXTENDED_RELEASE_TABLET | Freq: Two times a day (BID) | ORAL | 0 refills | Status: DC

## 2020-09-30 NOTE — Progress Notes (Deleted)
09/30/20: Confirmed patient got delivery of Potassium today, there was a rush getting Kcl refilled and coordination with Pharmacy affirmed it was sent out

## 2020-10-09 ENCOUNTER — Telehealth: Payer: Self-pay

## 2020-10-09 NOTE — Chronic Care Management (AMB) (Signed)
Left patient voicemail to call and reschedule telephone visit with [place] Constable CPP on 10-14-2020. Informed patient that I will cancel appointment and reschedule when she calls back.  [place] Pharmacist Assistant [phone]

## 2020-10-12 ENCOUNTER — Telehealth: Payer: Self-pay

## 2020-10-12 NOTE — Telephone Encounter (Signed)
I left a message on the number(s) listed in the patients chart requesting the patient to call back regarding the [place] appointment for 10/30/2020. The provider is out of the office that day. The appointment has been canceled. Waiting for the patient to return the call.

## 2020-10-14 ENCOUNTER — Telehealth: Payer: PPO

## 2020-10-20 ENCOUNTER — Telehealth: Payer: Self-pay

## 2020-10-20 ENCOUNTER — Other Ambulatory Visit: Payer: Self-pay

## 2020-10-20 MED ORDER — POTASSIUM CHLORIDE ER 10 MEQ PO CPCR: 20.0000 meq | ORAL_CAPSULE | Freq: Two times a day (BID) | ORAL | 0 refills | Status: DC

## 2020-10-20 NOTE — Chronic Care Management (AMB) (Signed)
Chronic Care Management Pharmacy Assistant   Name: Linda [name]  MRN: [id number] DOB: Oct 31, 1970   Reason for Encounter: Medication Coordination for [place]    Recent office visits:  07/24/20-Dr [name] PCP, diabetes, labs ordered, Trulicity increased to 2.7OZ/DGUYQI, patient reported not taking Lorazapam and Compazine.  Lab results: Positive UDS and is c/w current medications.  Platelets still low. Recommend repeat cbc in 1 month.  Liver and kidney function normal.  Diabetes much improved. A1c 5.8.  Cholesterol at goal.  Tsh normal.   Recent consult visits:  08/25/20- Landis Martins, DPM (Podiatry)- seen for pain due to onychomycosis of toenails of both feet, debridement performed, no medication changes, follow up 11 weeks  08/19/20 Orders Only (OncologyRonne Binning, Lenna Sciara NP. Ordered ondansetron (ZOFRAN) 4 MG tablet Take 1 tablet (4 mg total) by mouth every 4 (four) hours as needed for nausea and prochlorperazine (COMPAZINE) 10 MG tablet  Take 1 tablet (10 mg total) by mouth every 6 (six) hours as needed for nausea or vomiting.  08/19/20-Melissa Ronne Binning NP, cancer center, dehydration. Follow up in 3 months.   Hospital visits:  None in previous 6 months  Medications: Outpatient Encounter Medications as of 10/20/2020  Medication Sig Note   Blood Glucose Monitoring Suppl (ONETOUCH VERIO REFLECT) w/Device KIT AS DIRECTED    buPROPion (WELLBUTRIN XL) 300 MG 24 hr tablet Take 1 tablet (300 mg total) by mouth every evening.    busPIRone (BUSPAR) 5 MG tablet Take 1 tablet (5 mg total) by mouth 3 (three) times daily.    calcium citrate-vitamin D (CITRACAL+D) 315-200 MG-UNIT tablet Take 1 tablet by mouth 2 (two) times daily.    clotrimazole-betamethasone (LOTRISONE) cream Apply small amount to affected area twice daily    Continuous Blood Gluc Receiver (FREESTYLE LIBRE 2 READER) DEVI E11.69 Check blood sugar 4 times daily as directed    Continuous Blood Gluc Sensor (FREESTYLE LIBRE 2  SENSOR) MISC E11.69 Change sensor every 14 days as directed    cyclobenzaprine (FLEXERIL) 10 MG tablet TAKE ONE TABLET BY MOUTH EVERY 8 HOURS AS NEEDED FOR MUSCLE SPASMS    dicyclomine (BENTYL) 20 MG tablet TAKE ONE TABLET BY MOUTH BEFORE MEALS AND AT BEDTIME AS NEEDED FOR STOMACH CRAMPING    Dulaglutide (TRULICITY) 1.5 HK/7.4QV SOPN Inject 1.5 mg into the skin once a week.    Empagliflozin-metFORMIN HCl (SYNJARDY) 12.05-998 MG TABS Take 1 tablet by mouth 2 (two) times daily.    famotidine (PEPCID) 20 MG tablet Take 1 tablet (20 mg total) by mouth 2 (two) times daily.    ferrous sulfate 325 (65 FE) MG tablet Take 325 mg by mouth every evening.    gabapentin (NEURONTIN) 300 MG capsule Take 2 capsules (600 mg total) by mouth 3 (three) times daily.    Levomilnacipran HCl ER (FETZIMA) 80 MG CP24 Take 1 capsule by mouth daily.    levothyroxine (SYNTHROID) 75 MCG tablet Take 1 tablet (75 mcg total) by mouth daily.    loratadine (CLARITIN) 10 MG tablet Take 10 mg by mouth daily. Taking 3-4 days each week due to bone pain associated with shot from cancer center.    losartan (COZAAR) 50 MG tablet Take 1 tablet (50 mg total) by mouth every evening.    Magnesium 500 MG CAPS 500 mg.    morphine (MS CONTIN) 30 MG 12 hr tablet Take 1 tablet (30 mg total) by mouth every 12 (twelve) hours.    Multiple Vitamin (MULTIVITAMIN WITH MINERALS) TABS tablet Take 1 tablet  by mouth daily.  06/08/2018: Pt plans on purchasing again when she can get out.   ondansetron (ZOFRAN) 4 MG tablet Take 1 tablet (4 mg total) by mouth every 4 (four) hours as needed for nausea.    OneTouch Delica Lancets 70Y MISC 1 each by Does not apply route daily before breakfast. Check blood sugar twice daily.    pantoprazole (PROTONIX) 40 MG tablet Take 1 tablet (40 mg total) by mouth 2 (two) times daily.    Pegfilgrastim-jmdb (FULPHILA [place]) Inject into the skin.    potassium chloride (KLOR-CON) 10 MEQ tablet Take 2 tablets (20 mEq total) by mouth 2  (two) times daily.    pravastatin (PRAVACHOL) 20 MG tablet Take 1 tablet (20 mg total) by mouth at bedtime.    prochlorperazine (COMPAZINE) 10 MG tablet Take 1 tablet (10 mg total) by mouth every 6 (six) hours as needed for nausea or vomiting.    rOPINIRole (REQUIP) 0.25 MG tablet TAKE ONE TABLET BY MOUTH EVERYDAY AT BEDTIME    senna (SENOKOT) 8.6 MG tablet Take 1 tablet by mouth daily as needed for constipation.    SODIUM FLUORIDE 5000 SENSITIVE 1.1-5 % GEL Take by mouth 2 (two) times daily.    Specialty Vitamins Products (MAGNESIUM, AMINO ACID CHELATE,) 133 MG tablet Take 1 tablet by mouth at bedtime.    sucralfate (CARAFATE) 1 g tablet Take 1 tablet (1 g total) by mouth 4 (four) times daily -  with meals and at bedtime.    Vitamin D, Ergocalciferol, (DRISDOL) 1.25 MG (50000 UNIT) CAPS capsule TAKE ONE CAPSULE BY MOUTH ONCE WEEKLY ON FRIDAY    zolpidem (AMBIEN) 10 MG tablet TAKE ONE TABLET BY MOUTH EVERYDAY AT BEDTIME AS NEEDED    No facility-administered encounter medications on file as of 10/20/2020.   Reviewed chart for medication changes ahead of medication coordination call.  No OVs, Consults, or hospital visits since last care coordination call/Pharmacist visit. (If appropriate, list visit date, provider name)  No medication changes indicated OR if recent visit, treatment plan here.  BP Readings from Last 3 Encounters:  08/19/20 (!) 145/65  07/24/20 110/70  05/20/20 131/65    Lab Results  Component Value Date   HGBA1C 5.8 (H) 07/24/2020     Patient obtains medications through Vials w/ safety caps 30 Days   Last adherence delivery included:  Ropinirole -0.25 mg- 1 tablet daily (bedtime) Synjardy- 12.5-1,000 mg - 1 tablet twice daily Pantoprazole -40 mg 1 tablet twice daily Zolpidem- 10 mg- 1 tablet daily (bedtime) Levothyroxine 75 mcg- 1 tablet daily Gabapentin 300 mg -2 capsule 3 times daily Losartan 50 mg- 1 tablet every evening  Bupropion XL 300 mg -1 tablet daily  (bedtime) Famotidine 20 mg- 1 tablet twice daily Fetzima. ER 80 mg- 1 capsule daily Pravastatin 20 mg- 1 tablet daily (bedtime) Potassium CL ER 10 meq- 2 tablets twice a day Buspirone 5 mg- 1 tab 3 times daily  Morphine ER 30 mg-  1 capsule every 12 hours Vitamin D 50,000 units- One capsule weekly (Friday) Freestyle kit 2 Sensor- Use as directed and change every 14 days.  Trulicity Inject 1.5 mg into the skin once a week   Patient is due for next adherence delivery on: 10/30/20.  Called patient and reviewed medications and coordinated delivery.  This delivery to include: Ropinirole -0.25 mg- 1 tablet daily (bedtime) Synjardy- 12.5-1,000 mg - 1 tablet twice daily Pantoprazole -40 mg 1 tablet twice daily Zolpidem- 10 mg- 1 tablet daily (bedtime) Levothyroxine  75 mcg- 1 tablet daily Gabapentin 300 mg -2 capsule 3 times daily Losartan 50 mg- 1 tablet every evening  Bupropion XL 300 mg -1 tablet daily (bedtime) Famotidine 20 mg- 1 tablet twice daily Fetzima. ER 80 mg- 1 capsule daily Pravastatin 20 mg- 1 tablet daily (bedtime) Potassium CL ER 10 meq- 2 tablets twice a day( This needs to be in capsule form, pt cannot take tablets. I sent a message to Dr. Tobie Poet ) Buspirone 5 mg- 1 tab 3 times daily  Morphine ER 30 mg-  1 capsule every 12 hours Vitamin D 50,000 units- One capsule weekly (Friday) Freestyle kit 2 Sensor- Use as directed and change every 14 days.  Trulicity Inject 1.5 mg into the skin once a week Cyclobenzaprine 10 mg TAKE ONE TABLET BY MOUTH EVERY 8 HOURS AS NEEDED   Zofran 62m Take 1 tablet (4 mg total) by mouth every 4 (four) hours as needed     Patient needs refills for  Levothyroxine 75 mcg- 1 tablet daily Losartan 50 mg- 1 tablet every evening  Famotidine 20 mg- 1 tablet twice daily Buspirone 5 mg- 1 tab 3 times daily  Bupropion XL 300 mg -1 tablet daily (bedtime) Trulicity Inject 1.5 mg into the skin once a week  Confirmed delivery date of 10/30/20, advised  patient that pharmacy will contact them the morning of delivery.  [place]

## 2020-10-20 NOTE — Telephone Encounter (Signed)
Compliant on medications, proceed as directed

## 2020-10-26 ENCOUNTER — Other Ambulatory Visit: Payer: Self-pay | Admitting: Family Medicine

## 2020-10-26 DIAGNOSIS — Z1501 Genetic susceptibility to malignant neoplasm of breast: Secondary | ICD-10-CM

## 2020-10-26 DIAGNOSIS — E1121 Type 2 diabetes mellitus with diabetic nephropathy: Secondary | ICD-10-CM

## 2020-10-26 DIAGNOSIS — Z1509 Genetic susceptibility to other malignant neoplasm: Secondary | ICD-10-CM

## 2020-10-27 ENCOUNTER — Encounter: Payer: Self-pay | Admitting: Hematology and Oncology

## 2020-10-30 ENCOUNTER — Ambulatory Visit: Payer: PPO | Admitting: Family Medicine

## 2020-11-03 ENCOUNTER — Ambulatory Visit: Payer: PPO | Admitting: Family Medicine

## 2020-11-10 ENCOUNTER — Other Ambulatory Visit: Payer: Self-pay

## 2020-11-10 ENCOUNTER — Ambulatory Visit: Payer: PPO | Admitting: Sports Medicine

## 2020-11-10 ENCOUNTER — Encounter: Payer: Self-pay | Admitting: Sports Medicine

## 2020-11-10 DIAGNOSIS — E119 Type 2 diabetes mellitus without complications: Secondary | ICD-10-CM

## 2020-11-10 DIAGNOSIS — M79675 Pain in left toe(s): Secondary | ICD-10-CM

## 2020-11-10 DIAGNOSIS — M79674 Pain in right toe(s): Secondary | ICD-10-CM

## 2020-11-10 DIAGNOSIS — D6959 Other secondary thrombocytopenia: Secondary | ICD-10-CM

## 2020-11-10 DIAGNOSIS — T451X5A Adverse effect of antineoplastic and immunosuppressive drugs, initial encounter: Secondary | ICD-10-CM

## 2020-11-10 DIAGNOSIS — B351 Tinea unguium: Secondary | ICD-10-CM

## 2020-11-10 DIAGNOSIS — Z8739 Personal history of other diseases of the musculoskeletal system and connective tissue: Secondary | ICD-10-CM

## 2020-11-10 NOTE — Progress Notes (Signed)
Subjective: Linda [name] is a 50 y.o. female patient with history of diabetes who presents to office today complaining of long,mildly painful nails  while ambulating in shoes; unable to trim. Patient states that the left great toe 3 weeks ago started to bleed and she put Medihoney on it and states that she does not remember any trauma states that there is no drainage now with very little redness denies any constitutional symptoms.  Blood sugar this morning 129 last A1c 5.7 and last visit to PCP Dr. Tobie Poet 4 months ago.  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 04/17/2020   Osteomyelitis ([place]) 04/17/2020   Diabetic ulcer of toe of left foot associated with type 2 diabetes mellitus, with fat layer exposed ([place]) 03/27/2020   Dehydration 03/16/2020   Hypokalemia 03/09/2020   Chemotherapy-induced thrombocytopenia 02/06/2020   Thrombocytopenia, unspecified ([place]) 01/27/2020   Malignant neoplasm of lower-inner quadrant of right breast of female, estrogen receptor negative ([place]) 10/01/2019   Chronic pain syndrome 07/08/2019   Memory loss 07/08/2019   Depression, major, recurrent, mild ([place]) 18/84/1660   Uncomplicated opioid dependence ([place]) 07/08/2019   Mixed hyperlipidemia 04/11/2019   Dyslipidemia associated with type 2 diabetes mellitus ([place]) 04/11/2019   Essential hypertension, benign 04/11/2019   Major depressive disorder, single episode, mild ([place]) 04/11/2019   Acquired hypothyroidism 04/11/2019   Vitamin D insufficiency 04/11/2019   Class 2 severe obesity due to excess calories with serious comorbidity and body mass index (BMI) of 35.0 to 35.9 in adult ([place]) 04/11/2019   Pre-ulcerative calluses 03/04/2019   Liver cirrhosis secondary to NASH (nonalcoholic steatohepatitis) ([place]) 09/13/2018    Class: Chronic   BRCA1 positive  06/14/2016   Current Outpatient Medications on File Prior to Visit  Medication Sig Dispense Refill   Blood Glucose Monitoring Suppl (ONETOUCH VERIO REFLECT) w/Device KIT AS DIRECTED     buPROPion (WELLBUTRIN XL) 300 MG 24 hr tablet TAKE ONE TABLET BY MOUTH EVERY EVENING 90 tablet 1   busPIRone (BUSPAR) 5 MG tablet TAKE ONE TABLET BY MOUTH THREE TIMES DAILY 90 tablet 1   calcium citrate-vitamin D (CITRACAL+D) 315-200 MG-UNIT tablet Take 1 tablet by mouth 2 (two) times daily.     clotrimazole-betamethasone (LOTRISONE) cream Apply small amount to affected area twice daily 45 g 0   Continuous Blood Gluc Receiver (FREESTYLE LIBRE 2 READER) DEVI E11.69 Check blood sugar 4 times daily as directed 1 each 0   Continuous Blood Gluc Sensor (FREESTYLE LIBRE 2 SENSOR) MISC E11.69 Change sensor every 14 days as directed 6 each 3   cyclobenzaprine (FLEXERIL) 10 MG tablet TAKE ONE TABLET BY MOUTH EVERY 8 HOURS AS NEEDED FOR MUSCLE SPASMS 30 tablet 3   dicyclomine (BENTYL) 20 MG tablet TAKE ONE TABLET BY MOUTH BEFORE MEALS AND AT BEDTIME AS NEEDED FOR STOMACH CRAMPING 180 tablet 1   Empagliflozin-metFORMIN HCl (SYNJARDY) 12.05-998 MG TABS Take 1 tablet by mouth 2 (two) times daily. 180 tablet 0   famotidine (PEPCID) 20 MG tablet TAKE ONE TABLET BY MOUTH TWICE DAILY 180 tablet 1   ferrous sulfate 325 (65 FE) MG tablet Take 325 mg by mouth every evening.     gabapentin (NEURONTIN) 300 MG capsule Take 2 capsules (600 mg total) by mouth 3 (three) times daily. 180 capsule 2   Levomilnacipran HCl ER (FETZIMA) 80 MG CP24 Take 1 capsule by mouth daily.  90 capsule 1   levothyroxine (SYNTHROID) 75 MCG tablet TAKE ONE TABLET BY MOUTH ONCE DAILY 90 tablet 1   loratadine (CLARITIN) 10 MG tablet Take 10 mg by mouth daily. Taking 3-4 days each week due to bone pain associated with shot from cancer center.     losartan (COZAAR) 50 MG tablet TAKE ONE TABLET BY MOUTH EVERY EVENING 90 tablet 1   Magnesium 500 MG CAPS 500 mg.      morphine (MS CONTIN) 30 MG 12 hr tablet Take 1 tablet (30 mg total) by mouth every 12 (twelve) hours. 60 tablet 0   Multiple Vitamin (MULTIVITAMIN WITH MINERALS) TABS tablet Take 1 tablet by mouth daily.      ondansetron (ZOFRAN) 4 MG tablet Take 1 tablet (4 mg total) by mouth every 4 (four) hours as needed for nausea. 90 tablet 3   OneTouch Delica Lancets 12Y MISC 1 each by Does not apply route daily before breakfast. Check blood sugar twice daily. 100 each 3   pantoprazole (PROTONIX) 40 MG tablet Take 1 tablet (40 mg total) by mouth 2 (two) times daily. 180 tablet 1   Pegfilgrastim-jmdb (FULPHILA Villanueva) Inject into the skin.     potassium chloride (MICRO-K) 10 MEQ CR capsule Take 2 capsules (20 mEq total) by mouth 2 (two) times daily. 360 capsule 0   pravastatin (PRAVACHOL) 20 MG tablet Take 1 tablet (20 mg total) by mouth at bedtime. 90 tablet 1   prochlorperazine (COMPAZINE) 10 MG tablet Take 1 tablet (10 mg total) by mouth every 6 (six) hours as needed for nausea or vomiting. 90 tablet 3   rOPINIRole (REQUIP) 0.25 MG tablet TAKE ONE TABLET BY MOUTH EVERYDAY AT BEDTIME 30 tablet 2   senna (SENOKOT) 8.6 MG tablet Take 1 tablet by mouth daily as needed for constipation.     SODIUM FLUORIDE 5000 SENSITIVE 1.1-5 % GEL Take by mouth 2 (two) times daily.     Specialty Vitamins Products (MAGNESIUM, AMINO ACID CHELATE,) 133 MG tablet Take 1 tablet by mouth at bedtime.     sucralfate (CARAFATE) 1 g tablet Take 1 tablet (1 g total) by mouth 4 (four) times daily -  with meals and at bedtime. 482 tablet 1   TRULICITY 1.5 NO/0.3BC SOPN Inject 1.23m into THE SKIN ONCE A WEEK 2 mL 1   Vitamin D, Ergocalciferol, (DRISDOL) 1.25 MG (50000 UNIT) CAPS capsule TAKE ONE CAPSULE BY MOUTH ONCE WEEKLY ON FRIDAY 12 capsule 1   zolpidem (AMBIEN) 10 MG tablet TAKE ONE TABLET BY MOUTH EVERYDAY AT BEDTIME AS NEEDED 30 tablet 5   No current facility-administered medications on file prior to visit.   Allergies  Allergen  Reactions   Codeine Shortness Of Breath    Other reaction(s): SHOB   Celecoxib Other (See Comments)    Unknown reaction Other reaction(s): Unknown   Ezetimibe     Other reaction(s): Unknown   Ezetimibe-Simvastatin Other (See Comments)    Unknown reaction   Propranolol     Other reaction(s): Unknown   Propranolol Hcl Other (See Comments)    Unknown reaction   Simvastatin     Other reaction(s): Unknown    Recent Results (from the past 2160 hour(s))  CBC and differential     Status: Abnormal   Collection Time: 08/19/20 12:00 AM  Result Value Ref Range   Hemoglobin 11.6 (A) 12.0 - 16.0   HCT 36 36 - 46   Neutrophils Absolute 3.94    Platelets 79 (A) 150 -  399   WBC 5.4   CBC     Status: None   Collection Time: 08/19/20 12:00 AM  Result Value Ref Range   RBC 4.17 3.87 - 6.22  Basic metabolic panel     Status: Abnormal   Collection Time: 08/19/20 12:00 AM  Result Value Ref Range   Glucose 119    BUN 18 4 - 21   CO2 32 (A) 13 - 22   Creatinine 1.0 0.5 - 1.1   Potassium 4.1 3.4 - 5.3   Sodium 139 137 - 147   Chloride 99 99 - 108  Comprehensive metabolic panel     Status: None   Collection Time: 08/19/20 12:00 AM  Result Value Ref Range   Calcium 9.6 8.7 - 10.7   Albumin 4.1 3.5 - 5.0  Hepatic function panel     Status: None   Collection Time: 08/19/20 12:00 AM  Result Value Ref Range   Alkaline Phosphatase 71 25 - 125   ALT 16 7 - 35   AST 21 13 - 35   Bilirubin, Total 0.7     Objective: General: Patient is awake, alert, and oriented x 3 and in no acute distress.  Integument: Skin is warm, dry and supple bilateral. Nails are tender, long, thickened and dystrophic with subungual debris, consistent with onychomycosis, 1-5 bilateral. No signs of infection.  Dried blood noted at left hallux nail with partial detachment no acute signs of infection.  Dry skin to heels.  No open lesions or preulcerative lesions present bilateral. Remaining integument unremarkable. Previous  left second toe ulcer healed.  Vasculature:  Dorsalis Pedis pulse 1/4 bilateral. Posterior Tibial pulse  1/4 bilateral. Capillary fill time <3 sec 1-5 bilateral. Positive hair growth to the level of the digits.Temperature gradient within normal limits. No varicosities present bilateral. No edema present bilateral.   Neurology: The patient has diminished sensation measured with a 5.07/10g Semmes Weinstein Monofilament at all pedal sites bilateral.  Musculoskeletal:Asymptomatic hammertoe pedal deformities noted bilateral. Muscular strength 5/5 in all lower extremity muscular groups bilateral without pain on range of motion . No tenderness with calf compression bilateral.  Assessment and Plan: Problem List Items Addressed This Visit       Hematopoietic and Hemostatic   Chemotherapy-induced thrombocytopenia   Other Visit Diagnoses     Pain due to onychomycosis of toenails of both feet    -  Primary   History of osteomyelitis       Infant not underweight for age showing signs of fetal malnutrition as dry peeling skin and loss of subcutaneous tissue       Diabetes mellitus without complication ([place])           -Examined patient. -Discussed and educated patient on diabetic foot care, especially with  regards to the vascular, neurological and musculoskeletal systems.  -Stressed the importance of good glycemic control and the detriment of not  controlling glucose levels in relation to the foot. -Mechanically debrided all nails 1-5 bilateral using sterile nail nipper and filed with dremel without incident  -Left hallux nail completely came off during nail trimming there was no opening underneath advised patient to monitor if painful may use antibiotic ointment or Medihoney and Band-Aid however if not painful advised patient to leave open to air. -Answered all patient questions -Patient to return  in 2.5-3 months for at risk foot care -Patient advised to call the office if any problems or  questions arise in the meantime.  Landis Martins, DPM

## 2020-11-13 NOTE — Progress Notes (Signed)
[facility]  [address] [place],  [place]  [address] [phone]  Clinic Day:  11/19/2020  Referring physician: [name], MD  This document serves as a record of services personally performed by [name], MD. It was created on their behalf by [facility], a trained medical scribe. The creation of this record is based on the scribe's personal observations and the provider's statements to them.  CHIEF COMPLAINT:  CC: Stage IB triple negative breast cancer  Current Treatment:   Surveillance   HISTORY OF PRESENT ILLNESS:  Linda [name] is a 50 y.o. female who we began seeing in February 2018 for evaluation of anemia.  Her anemia was felt to be secondary to iron deficiency and we recommended continuation of oral iron supplementation for a total of 6 months, as well as referral back to the gastroenterologist.  The patient also has a history of thrombocytopenia and had previously seen Dr. Bobby Rumpf in 2014.  The thrombocytopenia was felt to be secondary to mild chronic immune thrombocytopenic purpura (ITP).  Due to the patient's family history of malignancy, she underwent testing for hereditary cancer syndromes with the [employer] myRisk Hereditary Cancer Panel test.  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  She underwent a robotic hysterectomy and bilateral salpingo-oophorectomy in May 2018 with Dr. Everitt Amber.  Pathology was benign.  We also discussed the option of risk reducing bilateral mastectomy, but she has chosen to have close surveillance, so we recommended annual MRI breast, in addition to mammography.  She was placed on chemoprevention with raloxifene in September 2018.  CT abdomen and pelvis in August 2020 done for bilateral flank pain, revealed a tiny left renal calculus, as well as probable hepatic  cirrhosis without evidence of hepatic mass.  She was then referred to [place] and is followed by Roosevelt Locks, CRNP in [place] for her liver cirrhosis.  She tested negative for hepatitis A, B, and C.  She received her hepatitis vaccines in 2020.   We saw her in September 2021 for a new diagnosis of stage IB (T1c N0 M0) triple negative right breast cancer.  She underwent screening bilateral mammogram on August 3rd which revealed possible masses in the right breast.  Diagnostic right mammogram and right breast ultrasound from August 18th confirmed suspicious masses in the right breast at 5 o'clock measuring 1.5 cm and 6 o'clock measuring 1.4 cm.  There was an indeterminate 4 mm with possible distortion in the outer right breast without sonographic correlate.  Right axillary ultrasound was normal.  She then underwent biopsies of both masses and surgical pathology from these procedures revealed  invasive ductal carcinoma, grade 3, with necrosis at 5 o'clock; and invasive ductal carcinoma, grade 1-2, with necrosis and focal myxoid change at 6 o'clock.  No DCIS was identified.  Breast prognostic profiles revealed HER2, and estrogen and progesterone receptors to be negative. Ki67 was 30% at the 5 o'clock mass, and 40% at 6 o'clock.  She underwent bilateral mastectomies on September 24th with Dr. Noberto Retort. Surgical pathology from this procedure revealed invasive ductal carcinoma, grade 3, with myxoid change, 19 mm, at 6 o'clock, and invasive ductal carcinoma, grade 3, and ductal carcinoma in situ, 19 mm, at 5 o'clock.  All margins were negative for invasive carcinoma or DCIS.  Two sentinel lymph nodes were negative for  metastatic carcinoma (0/2). CT chest, abdomen and pelvis in September did not reveal any evidence of metastatic disease.  She started dose dense AC chemotherapy on October 25th , with plans to follow this with weekly paclitaxel for 12 doses.  She had severe restless leg  syndrome with oral corticosteroids.  She had significantly worsened anemia with a hemoglobin of 8.7, as well as pancytopenia, so the 4th cycle of chemotherapy was delayed, but she finally got her last dose on January 4th.  Iron studies, B12 and folate were normal. Her anemia has slowly improved.  She started weekly paclitaxel on January 26th and has received 8 out of 12 planned doses.   She did have some expected pancytopenia.  She has pre-existing neuropathy of the feet and legs.    She was admitted to the hospital for osteomyelitis of the left 2nd toe in March 2022.  She was treated with empiric vancomycin and Zosyn.  Amputation was recommended but the patient refused.  Cultures returned with Klebsiella, sensitive to IV Rocephin, so long term antibiotics 2 g Q24H will be continued for the next 6 weeks. At that time, we decided to discontinue weekly paclitaxel as she had received 8/12 cycles and had significant neuropathy.   INTERVAL HISTORY:  Linda [name] is here for routine follow up and states that she has been well and denies complaints. She undergoes routine lab work with Dr. Tobie Poet every 3 months. Labs from this month were unremarkable except for a platelet count of 96,000, improved, and a blood glucose of 236. Her anemia has resolved. Her  appetite is good, and she has gained 8 pounds since her last visit.  She denies fever, chills or other signs of infection.  She denies nausea, vomiting, bowel issues, or abdominal pain.  She denies sore throat, cough, dyspnea, or chest pain.  REVIEW OF SYSTEMS:  Review of Systems  Constitutional: Negative.  Negative for appetite change, chills, fatigue, fever and unexpected weight change.  HENT:  Negative.    Eyes: Negative.   Respiratory: Negative.  Negative for chest tightness, cough, hemoptysis, shortness of breath and wheezing.   Cardiovascular: Negative.  Negative for chest pain, leg swelling and palpitations.  Gastrointestinal: Negative.  Negative for abdominal  distention, abdominal pain, blood in stool, constipation, diarrhea, nausea and vomiting.  Endocrine: Negative.   Genitourinary: Negative.  Negative for difficulty urinating, dysuria, frequency and hematuria.   Musculoskeletal: Negative.  Negative for arthralgias, back pain, flank pain, gait problem and myalgias.  Skin: Negative.   Neurological: Negative.  Negative for dizziness, extremity weakness, gait problem, headaches, light-headedness, numbness, seizures and speech difficulty.  Hematological: Negative.   Psychiatric/Behavioral: Negative.  Negative for depression and sleep disturbance. The patient is not nervous/anxious.    VITALS:  Blood pressure (!) 146/72, pulse 100, temperature 98.3 F (36.8 C), temperature source Oral, resp. rate 18, height 5' 2"  (1.575 m), weight 202 lb 1.6 oz (91.7 kg), last menstrual period 06/13/2009, SpO2 96 %.  Wt Readings from Last 3 Encounters:  11/19/20 202 lb 1.6 oz (91.7 kg)  11/16/20 200 lb (90.7 kg)  08/19/20 194 lb 8 oz (88.2 kg)    Body mass index is 36.96 kg/m.  Performance status (ECOG): 0 - Asymptomatic  PHYSICAL EXAM:  Physical Exam Constitutional:      General: She is not in acute distress.    Appearance: Normal appearance. She is normal weight.  HENT:     Head: Normocephalic and atraumatic.  Eyes:     General: No scleral  icterus.    Extraocular Movements: Extraocular movements intact.     Conjunctiva/sclera: Conjunctivae normal.     Pupils: Pupils are equal, round, and reactive to light.  Cardiovascular:     Rate and Rhythm: Normal rate and regular rhythm.     Pulses: Normal pulses.     Heart sounds: Normal heart sounds. No murmur heard.   No friction rub. No gallop.  Pulmonary:     Effort: Pulmonary effort is normal. No respiratory distress.     Breath sounds: Normal breath sounds.  Chest:  Breasts:    Right: Absent.     Left: Absent.     Comments: Bilateral mastectomies are negative Abdominal:     General: Bowel sounds  are normal. There is no distension.     Palpations: Abdomen is soft. There is hepatomegaly (9-10 cm below the right costal margin) and splenomegaly (mild). There is no mass.     Tenderness: There is no abdominal tenderness.  Musculoskeletal:        General: Normal range of motion.     Cervical back: Normal range of motion and neck supple.     Right lower leg: No edema.     Left lower leg: No edema.  Lymphadenopathy:     Cervical: No cervical adenopathy.  Skin:    General: Skin is warm and dry.  Neurological:     General: No focal deficit present.     Mental Status: She is alert and oriented to person, place, and time. Mental status is at baseline.  Psychiatric:        Mood and Affect: Mood normal.        Behavior: Behavior normal.        Thought Content: Thought content normal.        Judgment: Judgment normal.   LABS:   CBC Latest Ref Rng & Units 11/16/2020 08/19/2020 07/24/2020  WBC 3.4 - 10.8 x10E3/uL 6.2 5.4 5.9  Hemoglobin 11.1 - 15.9 g/dL 12.7 11.6(A) 12.2  Hematocrit 34.0 - 46.6 % 40.4 36 37.6  Platelets 150 - 450 x10E3/uL 96(LL) 79(A) 89(LL)   CMP Latest Ref Rng & Units 11/16/2020 08/19/2020 07/24/2020  Glucose 70 - 99 mg/dL 236(H) - 114(H)  BUN 6 - 24 mg/dL 10 18 15   Creatinine 0.57 - 1.00 mg/dL 1.03(H) 1.0 0.99  Sodium 134 - 144 mmol/L 140 139 140  Potassium 3.5 - 5.2 mmol/L 4.1 4.1 4.0  Chloride 96 - 106 mmol/L 99 99 98  CO2 20 - 29 mmol/L 25 32(A) 26  Calcium 8.7 - 10.2 mg/dL 9.5 9.6 9.4  Total Protein 6.0 - 8.5 g/dL 6.8 - 6.2  Total Bilirubin 0.0 - 1.2 mg/dL 0.5 - 0.5  Alkaline Phos 44 - 121 IU/L 109 71 76  AST 0 - 40 IU/L 18 21 16   ALT 0 - 32 IU/L 22 16 20       Lab Results  Component Value Date   TIBC 312 05/20/2020   TIBC 300 01/13/2020   FERRITIN 98 05/20/2020   IRONPCTSAT 26 05/20/2020   IRONPCTSAT 24.3 01/13/2020   No results found for: LDH  STUDIES:    HISTORY:   Allergies:  Allergies  Allergen Reactions   Codeine Shortness Of Breath     Other reaction(s): SHOB   Celecoxib Other (See Comments)    Unknown reaction Other reaction(s): Unknown   Ezetimibe     Other reaction(s): Unknown   Ezetimibe-Simvastatin Other (See Comments)    Unknown reaction   Propranolol  Other reaction(s): Unknown   Propranolol Hcl Other (See Comments)    Unknown reaction   Simvastatin     Other reaction(s): Unknown    Current Medications: Current Outpatient Medications  Medication Sig Dispense Refill   Blood Glucose Monitoring Suppl (ONETOUCH VERIO REFLECT) w/Device KIT AS DIRECTED     buPROPion (WELLBUTRIN XL) 300 MG 24 hr tablet TAKE ONE TABLET BY MOUTH EVERY EVENING 90 tablet 1   busPIRone (BUSPAR) 5 MG tablet TAKE ONE TABLET BY MOUTH THREE TIMES DAILY 90 tablet 1   calcium citrate-vitamin D (CITRACAL+D) 315-200 MG-UNIT tablet Take 1 tablet by mouth 2 (two) times daily.     clotrimazole-betamethasone (LOTRISONE) cream Apply small amount to affected area twice daily 45 g 0   Continuous Blood Gluc Receiver (FREESTYLE LIBRE 2 READER) DEVI E11.69 Check blood sugar 4 times daily as directed 1 each 0   Continuous Blood Gluc Sensor (FREESTYLE LIBRE 2 SENSOR) MISC E11.69 Change sensor every 14 days as directed 6 each 3   cyclobenzaprine (FLEXERIL) 10 MG tablet TAKE ONE TABLET BY MOUTH EVERY 8 HOURS AS NEEDED FOR MUSCLE SPASMS 30 tablet 3   dicyclomine (BENTYL) 20 MG tablet TAKE ONE TABLET BY MOUTH BEFORE MEALS AND AT BEDTIME AS NEEDED FOR STOMACH CRAMPING 180 tablet 1   Dulaglutide (TRULICITY) 3 ZY/6.0YT SOPN Inject 3 mg as directed once a week. 2 mL 2   Empagliflozin-metFORMIN HCl (SYNJARDY) 12.05-998 MG TABS Take 1 tablet by mouth 2 (two) times daily. 180 tablet 0   famotidine (PEPCID) 20 MG tablet TAKE ONE TABLET BY MOUTH TWICE DAILY 180 tablet 1   ferrous sulfate 325 (65 FE) MG tablet Take 325 mg by mouth every evening.     gabapentin (NEURONTIN) 300 MG capsule Take 2 capsules (600 mg total) by mouth 3 (three) times daily. 180 capsule 2    Levomilnacipran HCl ER (FETZIMA) 80 MG CP24 Take 1 capsule by mouth daily. 90 capsule 1   levothyroxine (SYNTHROID) 75 MCG tablet TAKE ONE TABLET BY MOUTH ONCE DAILY 90 tablet 1   lisdexamfetamine (VYVANSE) 40 MG capsule Take 1 capsule (40 mg total) by mouth every morning. 30 capsule 0   loratadine (CLARITIN) 10 MG tablet Take 10 mg by mouth daily. Taking 3-4 days each week due to bone pain associated with shot from cancer center.     losartan (COZAAR) 50 MG tablet TAKE ONE TABLET BY MOUTH EVERY EVENING 90 tablet 1   Magnesium 500 MG CAPS 500 mg.     morphine (MS CONTIN) 30 MG 12 hr tablet Take 1 tablet (30 mg total) by mouth every 12 (twelve) hours. 60 tablet 0   Multiple Vitamin (MULTIVITAMIN WITH MINERALS) TABS tablet Take 1 tablet by mouth daily.      ondansetron (ZOFRAN) 4 MG tablet Take 1 tablet (4 mg total) by mouth every 4 (four) hours as needed for nausea. 90 tablet 3   OneTouch Delica Lancets 01S MISC 1 each by Does not apply route daily before breakfast. Check blood sugar twice daily. 100 each 3   pantoprazole (PROTONIX) 40 MG tablet Take 1 tablet (40 mg total) by mouth 2 (two) times daily. 180 tablet 1   Pegfilgrastim-jmdb (FULPHILA [place]) Inject into the skin.     potassium chloride (MICRO-K) 10 MEQ CR capsule Take 2 capsules (20 mEq total) by mouth 2 (two) times daily. 360 capsule 0   pravastatin (PRAVACHOL) 20 MG tablet Take 1 tablet (20 mg total) by mouth at bedtime. 90 tablet 1  prochlorperazine (COMPAZINE) 10 MG tablet Take 1 tablet (10 mg total) by mouth every 6 (six) hours as needed for nausea or vomiting. 90 tablet 3   rOPINIRole (REQUIP) 0.25 MG tablet TAKE ONE TABLET BY MOUTH EVERYDAY AT BEDTIME 30 tablet 2   senna (SENOKOT) 8.6 MG tablet Take 1 tablet by mouth daily as needed for constipation.     SODIUM FLUORIDE 5000 SENSITIVE 1.1-5 % GEL Take by mouth 2 (two) times daily.     Specialty Vitamins Products (MAGNESIUM, AMINO ACID CHELATE,) 133 MG tablet Take 1 tablet by mouth at  bedtime.     Vitamin D, Ergocalciferol, (DRISDOL) 1.25 MG (50000 UNIT) CAPS capsule TAKE ONE CAPSULE BY MOUTH ONCE WEEKLY ON FRIDAY 12 capsule 1   zolpidem (AMBIEN) 10 MG tablet TAKE ONE TABLET BY MOUTH EVERYDAY AT BEDTIME AS NEEDED 30 tablet 5   No current facility-administered medications for this visit.     ASSESSMENT & PLAN:   Assessment: 1. Positive BRCA 1 mutation, which increases her risk for breast and ovarian cancer, as well as elevates her risk for pancreatic cancer, for which she underwent hysterectomy/bilateral salpingo-oophorectomy and is on chemoprevention with raloxifene and regular surveillance.    2.  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  She has undergone bilateral mastectomies.  She completed 4 cycles of Adriamycin/cyclophosphamide chemotherapy and received 8 out of 12 planned weeks of weekly paclitaxel.   3.  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  We feel this is a separate primary.  She has undergone bilateral mastectomies.  She completed 4 cycles of Adriamycin/cyclophosphamide chemotherapy and was receiving weekly paclitaxel but this was discontinued after 8 cycles.   4. 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 Platelets are improved at 96,000.   5. Liver cirrhosis as seen on CT imaging in August 2020.  She has been referred and is being seen by Roosevelt Locks, CRNP, at [facility] in [place].  Hepatitis panel was negative and she has received the appropriate vaccines.  Most recent imaging from May 2022 was stable.   Plan:   As she continues to do well, we will see her back in clinic in 3 months for repeat evaluation. She verbalizes understanding of and  agreement to the plans discussed today. She knows to call the office should any new questions or concerns arise.   I, Rita Ohara, am acting as scribe for Derwood Kaplan, MD  I have reviewed this report as typed by the medical scribe, and it is complete and accurate.

## 2020-11-14 ENCOUNTER — Encounter: Payer: Self-pay | Admitting: Family Medicine

## 2020-11-16 ENCOUNTER — Ambulatory Visit (INDEPENDENT_AMBULATORY_CARE_PROVIDER_SITE_OTHER): Payer: PPO | Admitting: Family Medicine

## 2020-11-16 ENCOUNTER — Encounter: Payer: Self-pay | Admitting: Family Medicine

## 2020-11-16 ENCOUNTER — Ambulatory Visit (INDEPENDENT_AMBULATORY_CARE_PROVIDER_SITE_OTHER): Payer: PPO

## 2020-11-16 ENCOUNTER — Other Ambulatory Visit: Payer: Self-pay

## 2020-11-16 VITALS — BP 118/68 | HR 98 | Temp 97.8°F | Ht 62.0 in | Wt 200.0 lb

## 2020-11-16 DIAGNOSIS — E559 Vitamin D deficiency, unspecified: Secondary | ICD-10-CM

## 2020-11-16 DIAGNOSIS — C50311 Malignant neoplasm of lower-inner quadrant of right female breast: Secondary | ICD-10-CM

## 2020-11-16 DIAGNOSIS — F1120 Opioid dependence, uncomplicated: Secondary | ICD-10-CM | POA: Diagnosis not present

## 2020-11-16 DIAGNOSIS — F5081 Binge eating disorder: Secondary | ICD-10-CM

## 2020-11-16 DIAGNOSIS — I129 Hypertensive chronic kidney disease with stage 1 through stage 4 chronic kidney disease, or unspecified chronic kidney disease: Secondary | ICD-10-CM

## 2020-11-16 DIAGNOSIS — E1121 Type 2 diabetes mellitus with diabetic nephropathy: Secondary | ICD-10-CM | POA: Diagnosis not present

## 2020-11-16 DIAGNOSIS — Z1501 Genetic susceptibility to malignant neoplasm of breast: Secondary | ICD-10-CM

## 2020-11-16 DIAGNOSIS — E039 Hypothyroidism, unspecified: Secondary | ICD-10-CM | POA: Diagnosis not present

## 2020-11-16 DIAGNOSIS — D693 Immune thrombocytopenic purpura: Secondary | ICD-10-CM | POA: Diagnosis not present

## 2020-11-16 DIAGNOSIS — Z1509 Genetic susceptibility to other malignant neoplasm: Secondary | ICD-10-CM

## 2020-11-16 DIAGNOSIS — N1832 Chronic kidney disease, stage 3b: Secondary | ICD-10-CM

## 2020-11-16 DIAGNOSIS — G894 Chronic pain syndrome: Secondary | ICD-10-CM | POA: Diagnosis not present

## 2020-11-16 DIAGNOSIS — Z171 Estrogen receptor negative status [ER-]: Secondary | ICD-10-CM

## 2020-11-16 DIAGNOSIS — K7460 Unspecified cirrhosis of liver: Secondary | ICD-10-CM

## 2020-11-16 DIAGNOSIS — F330 Major depressive disorder, recurrent, mild: Secondary | ICD-10-CM | POA: Diagnosis not present

## 2020-11-16 DIAGNOSIS — Z23 Encounter for immunization: Secondary | ICD-10-CM

## 2020-11-16 DIAGNOSIS — E782 Mixed hyperlipidemia: Secondary | ICD-10-CM

## 2020-11-16 DIAGNOSIS — K7581 Nonalcoholic steatohepatitis (NASH): Secondary | ICD-10-CM

## 2020-11-16 DIAGNOSIS — Z6835 Body mass index (BMI) 35.0-35.9, adult: Secondary | ICD-10-CM

## 2020-11-16 MED ORDER — LISDEXAMFETAMINE DIMESYLATE 40 MG PO CAPS: 40.0000 mg | ORAL_CAPSULE | ORAL | 0 refills | Status: DC

## 2020-11-16 MED ORDER — TRULICITY 3 MG/0.5ML ~~LOC~~ SOAJ: 3.0000 mg | SUBCUTANEOUS | 2 refills | Status: DC

## 2020-11-16 NOTE — Progress Notes (Addendum)
Subjective:  Patient ID: Linda [name], female    DOB: 08-31-1970  Age: 50 y.o. MRN: [id number]  Chief Complaint  Patient presents with   Diabetes   Hypothyroidism    Diabetes:  Complications: Patient states she has been stress eating and bad eating. Glucose checking: Patient states she checks her sugars 3 times daily. Glucose logs: Ranges from 200-400 Hypoglycemia: no Most recent A1C: Done 07/24/2020 results: 5.8 Current medications: Trulicity 6.8EH injection once weekly, Synjardy 12.05/998 mg Take 1 tablet by mouth 2 times daily. Last Eye Exam: 04/30/2020 negative for retinopathy. Foot checks: Yes  Hyperlipidemia: Current medications: Pravachol 67m Take 1 tablet by mouth at night.  Chronic back pain: on gabapentin 600 mg tid. On mscontin 30 bid  Vitamin D deficiency: Vitamin D 50000 weekly.  GERD: protonix 40 mg bid. Pepcid 20 mg once daily. Depression: wellbutrin xl 300 mg once in am, buspirone 5 mg one tid.fetzima 855monce daily. PHQ9 SCORE ONLY 11/16/2020 07/21/2020 03/09/2020  PHQ-9 Total Score 13 14 21    Hypothyroid: levothyroxine 75 mcg once daily in am.   Diet: Poor. Eats a lot. Binges. Did binge eating disorder questionaire and she was overwhelmingly positive.  Exercise: none.  Chronic back pain/Opioid dependence: on MS Contin 30 mg bid.  RLS: on requip.  Breast cancer: Positive BRCA 1 mutation, which increases her risk for breast and ovarian cancer, as well as elevates her risk for pancreatic cancer, for which she underwent hysterectomy/bilateral salpingo-oophorectomy/BL mastectomy and is on chemoprevention with raloxifene and regular surveillance.  Thrombocytopenia (chronic idiopathic) - stable.    Current Outpatient Medications on File Prior to Visit  Medication Sig Dispense Refill   Blood Glucose Monitoring Suppl (ONETOUCH VERIO REFLECT) w/Device KIT AS DIRECTED     buPROPion (WELLBUTRIN XL) 300 MG 24 hr tablet TAKE ONE TABLET BY MOUTH EVERY EVENING 90  tablet 1   busPIRone (BUSPAR) 5 MG tablet TAKE ONE TABLET BY MOUTH THREE TIMES DAILY 90 tablet 1   calcium citrate-vitamin D (CITRACAL+D) 315-200 MG-UNIT tablet Take 1 tablet by mouth 2 (two) times daily.     clotrimazole-betamethasone (LOTRISONE) cream Apply small amount to affected area twice daily 45 g 0   Continuous Blood Gluc Receiver (FREESTYLE LIBRE 2 READER) DEVI E11.69 Check blood sugar 4 times daily as directed 1 each 0   Continuous Blood Gluc Sensor (FREESTYLE LIBRE 2 SENSOR) MISC E11.69 Change sensor every 14 days as directed 6 each 3   cyclobenzaprine (FLEXERIL) 10 MG tablet TAKE ONE TABLET BY MOUTH EVERY 8 HOURS AS NEEDED FOR MUSCLE SPASMS 30 tablet 3   dicyclomine (BENTYL) 20 MG tablet TAKE ONE TABLET BY MOUTH BEFORE MEALS AND AT BEDTIME AS NEEDED FOR STOMACH CRAMPING 180 tablet 1   Empagliflozin-metFORMIN HCl (SYNJARDY) 12.05-998 MG TABS Take 1 tablet by mouth 2 (two) times daily. 180 tablet 0   famotidine (PEPCID) 20 MG tablet TAKE ONE TABLET BY MOUTH TWICE DAILY 180 tablet 1   ferrous sulfate 325 (65 FE) MG tablet Take 325 mg by mouth every evening.     levothyroxine (SYNTHROID) 75 MCG tablet TAKE ONE TABLET BY MOUTH ONCE DAILY 90 tablet 1   loratadine (CLARITIN) 10 MG tablet Take 10 mg by mouth daily. Taking 3-4 days each week due to bone pain associated with shot from cancer center.     losartan (COZAAR) 50 MG tablet TAKE ONE TABLET BY MOUTH EVERY EVENING 90 tablet 1   Magnesium 500 MG CAPS 500 mg.     Multiple  Vitamin (MULTIVITAMIN WITH MINERALS) TABS tablet Take 1 tablet by mouth daily.      ondansetron (ZOFRAN) 4 MG tablet Take 1 tablet (4 mg total) by mouth every 4 (four) hours as needed for nausea. 90 tablet 3   OneTouch Delica Lancets 03Y MISC 1 each by Does not apply route daily before breakfast. Check blood sugar twice daily. 100 each 3   pantoprazole (PROTONIX) 40 MG tablet Take 1 tablet (40 mg total) by mouth 2 (two) times daily. 180 tablet 1   Pegfilgrastim-jmdb  (FULPHILA [place]) Inject into the skin.     potassium chloride (MICRO-K) 10 MEQ CR capsule Take 2 capsules (20 mEq total) by mouth 2 (two) times daily. 360 capsule 0   prochlorperazine (COMPAZINE) 10 MG tablet Take 1 tablet (10 mg total) by mouth every 6 (six) hours as needed for nausea or vomiting. 90 tablet 3   rOPINIRole (REQUIP) 0.25 MG tablet TAKE ONE TABLET BY MOUTH EVERYDAY AT BEDTIME 30 tablet 2   senna (SENOKOT) 8.6 MG tablet Take 1 tablet by mouth daily as needed for constipation.     SODIUM FLUORIDE 5000 SENSITIVE 1.1-5 % GEL Take by mouth 2 (two) times daily.     Specialty Vitamins Products (MAGNESIUM, AMINO ACID CHELATE,) 133 MG tablet Take 1 tablet by mouth at bedtime.     zolpidem (AMBIEN) 10 MG tablet TAKE ONE TABLET BY MOUTH EVERYDAY AT BEDTIME AS NEEDED 30 tablet 5   No current facility-administered medications on file prior to visit.   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 No longer has   Past Surgical History:  Procedure Laterality Date   APPENDECTOMY     BACK SURGERY     x 3 lower disc   BILATERAL  TOTAL MASTECTOMY WITH AXILLARY LYMPH NODE DISSECTION Bilateral 09/2019   CHOLECYSTECTOMY     nephrolithiasis     ROBOTIC ASSISTED TOTAL HYSTERECTOMY WITH BILATERAL SALPINGO OOPHERECTOMY Bilateral 06/14/2016   Procedure: ROBOTIC ASSISTED TOTAL HYSTERECTOMY WITH BILATERAL SALPINGO OOPHORECTOMY;  Surgeon: Nancy Marus, MD;  Location: [facility];  Service: Gynecology;  Laterality: Bilateral;    Family History  Problem Relation Age of Onset   Cancer Mother        breast   CAD Father    Diabetes Father    Heart failure Father    Cancer Father        renal carcinoma   Kidney failure Father    Stroke Paternal Grandmother  Social History   Socioeconomic History   Marital status: Divorced    Spouse name: Not on file   Number of children: Not on file   Years of education: Not on file   Highest education level: Not on file  Occupational History   Occupation: disabled  Tobacco Use   Smoking status: Never   Smokeless tobacco: Never  Substance and Sexual Activity   Alcohol use: No   Drug use: No   Sexual activity: Yes  Other Topics Concern   Not on file  Social History Narrative   wears sunscreen, brushes and flosses daily, see's dentist bi-annually, has smoke/carbon monoxide detectors, wears a seatbelt and practices gun safety   Social Determinants of Health   Financial Resource Strain: Not on file  Food Insecurity: Not on file  Transportation Needs: Not on file  Physical Activity: Not on file  Stress: Not on file  Social Connections: Not on file    Review of Systems  Constitutional:  Positive for appetite change (Patient states she has been stress eating.). Negative for fatigue and fever.  HENT:  Negative for congestion, ear pain, sinus pressure and sore throat.   Eyes:  Negative for pain.  Respiratory:  Negative for cough, chest tightness, shortness of breath and wheezing.   Cardiovascular:  Negative for chest pain and palpitations.  Gastrointestinal:  Negative for abdominal  pain, constipation, diarrhea, nausea and vomiting.  Genitourinary:  Negative for dysuria and hematuria.  Musculoskeletal:  Negative for arthralgias, back pain, joint swelling and myalgias.  Skin:  Negative for rash.  Neurological:  Negative for dizziness, weakness and headaches.  Psychiatric/Behavioral:  Negative for dysphoric mood. The patient is not nervous/anxious.     Objective:  BP 118/68 (BP Location: Right Arm, Patient Position: Sitting)   Pulse 98   Temp 97.8 F (36.6 C) (Temporal)   Ht 5' 2"  (1.575 m)   Wt 200 lb (90.7 kg)   LMP 06/13/2009   SpO2 99%   BMI 36.58 kg/m   BP/Weight 11/19/2020 11/16/2020 1/70/0174  Systolic BP 944 967 591  Diastolic BP 72 68 65  Wt. (Lbs) 202.1 200 194.5  BMI 36.96 36.58 35.57    Physical Exam Vitals reviewed.  Constitutional:      Appearance: Normal appearance. She is obese.  Neck:     Vascular: No carotid bruit.  Cardiovascular:     Rate and Rhythm: Normal rate and regular rhythm.     Pulses: Normal pulses.     Heart sounds: Normal heart sounds.  Pulmonary:     Effort: Pulmonary effort is normal. No respiratory distress.     Breath sounds: Normal breath sounds.  Abdominal:     General: Abdomen is flat. Bowel sounds are normal.     Palpations: Abdomen is soft.     Tenderness: There is no abdominal tenderness.  Musculoskeletal:        General: Tenderness (lumbar) present.  Neurological:     Mental Status: She is alert and oriented to person, place, and time.  Psychiatric:        Mood and Affect: Mood normal.        Behavior: Behavior normal.    Diabetic Foot Exam - Simple   No data filed      Lab Results  Component Value Date   WBC 6.2 11/16/2020   HGB 12.7 11/16/2020   HCT 40.4 11/16/2020   PLT 96 (LL) 11/16/2020   GLUCOSE 236 (H) 11/16/2020   CHOL 227 (H) 11/16/2020  TRIG 284 (H) 11/16/2020   HDL 46 11/16/2020   LDLCALC 130 (H) 11/16/2020   ALT 22 11/16/2020   AST 18 11/16/2020   NA 140 11/16/2020   K  4.1 11/16/2020   CL 99 11/16/2020   CREATININE 1.03 (H) 11/16/2020   BUN 10 11/16/2020   CO2 25 11/16/2020   TSH 1.440 07/24/2020   HGBA1C 8.2 (H) 11/16/2020   MICROALBUR 30 10/09/2019      Assessment & Plan:   Problem List Items Addressed This Visit       Digestive   Liver cirrhosis secondary to NASH (nonalcoholic steatohepatitis) ([place]) (Chronic)    Found during surveillance for her breast cancer.seeing liver specialist. Weight loss and good diabetic control is most important.         Endocrine   Acquired hypothyroidism    The current medical regimen is effective;  continue present plan and medications.       Diabetic glomerulopathy (HCC) - Primary    HBA1C: much worse. 5.8 increased to 8.2. We discussed changes below.  Increase trulicity 3 mg once weekly. Continue synjardy 12.5/1000mg once twice daily.  Started treating for binge eating disorder. I started her on vyvanse 40 mg once daily in am.      Relevant Medications   Dulaglutide (TRULICITY) 3 PQ/3.3AQ SOPN   Other Relevant Orders   Hemoglobin A1c (Completed)     Genitourinary   RESOLVED: Hypertensive kidney disease with stage 3b chronic kidney disease (HCC)    BP is well controlled off medicine.  CKD stable.       Relevant Orders   CBC with Differential/Platelet (Completed)   Comprehensive metabolic panel (Completed)     Hematopoietic and Hemostatic   Idiopathic thrombocytopenic purpura (ITP) (HCC)    Platelets remain 90-110. Stable.          Other   BRCA1 positive (Chronic)    Surveillance by oncology.  Continue raloxifiene.      Malignant neoplasm of lower-inner quadrant of right breast of female, estrogen receptor negative ([place]) (Chronic)    Surveillance by [facility].  Continue raloxifene.      Mixed hyperlipidemia    Cholesterol: much worse. Trigs and ldl both much worse. Please ask if pt is taking pravastatin. If so, I would recommend we increase dose to 40 mg once daily  at night. Recommend start vascepa 1 gm 2 capsules twice daily.       Relevant Orders   Lipid panel (Completed)   Vitamin D insufficiency    The current medical regimen is effective;  continue present plan and medications.       Class 2 severe obesity due to excess calories with serious comorbidity and body mass index (BMI) of 35.0 to 35.9 in adult [facility])    Recommend continue to work on eating healthy diet. Treating binge eating disorder.  Comorbidites include DM, Hyperlipidemia, and NASH      Relevant Medications   Dulaglutide (TRULICITY) 3 TM/2.2QJ SOPN   lisdexamfetamine (VYVANSE) 40 MG capsule   Chronic pain syndrome    Severe back pain.  Continue mscontin 30 bid.       Depression, major, recurrent, mild ([place])    The current medical regimen is effective;  continue present plan and medications. Continue wellbutrin xl 300 mg once in am, buspirone 5 mg one tid.fetzima 44m once daily.      Uncomplicated opioid dependence ([place]    Pain mgmt contract signed. Plan UDS every 6 months.  Binge eating disorder    I started her on vyvanse 40 mg once daily in am.      Relevant Medications   lisdexamfetamine (VYVANSE) 40 MG capsule   Other Visit Diagnoses     Encounter for immunization       Relevant Orders   Flu Vaccine MDCK QUAD PF (Completed)     .  Meds ordered this encounter  Medications   Dulaglutide (TRULICITY) 3 AL/9.3XT SOPN    Sig: Inject 3 mg as directed once a week.    Dispense:  2 mL    Refill:  2   lisdexamfetamine (VYVANSE) 40 MG capsule    Sig: Take 1 capsule (40 mg total) by mouth every morning.    Dispense:  30 capsule    Refill:  0    Orders Placed This Encounter  Procedures   Flu Vaccine MDCK QUAD PF   CBC with Differential/Platelet   Comprehensive metabolic panel   Hemoglobin A1c   Lipid panel     Follow-up: Return in about 4 weeks (around 12/14/2020) for chronic follow up.  An After Visit Summary was printed and given to the  patient.  I,Lauren M Auman,acting as a scribe for [name], MD.,have documented all relevant documentation on the behalf of [name], MD,as directed by  [name], MD while in the presence of [name], MD.    [name], MD [place] [phone]

## 2020-11-16 NOTE — Patient Instructions (Signed)
Diabetes Increase trulicity 3 mg once weekly  Continue synjardy 12.5/1000mg once twice daily.   Binge eating disorder.  Start on vyvanse 40 mg once daily in am.

## 2020-11-17 LAB — LIPID PANEL
Chol/HDL Ratio: 4.9 ratio — ABNORMAL HIGH (ref 0.0–4.4)
Cholesterol, Total: 227 mg/dL — ABNORMAL HIGH (ref 100–199)
HDL: 46 mg/dL (ref >39)
LDL Chol Calc (NIH): 130 mg/dL — ABNORMAL HIGH (ref 0–99)
Triglycerides: 284 mg/dL — ABNORMAL HIGH (ref 0–149)
VLDL Cholesterol Cal: 51 mg/dL — ABNORMAL HIGH (ref 5–40)

## 2020-11-17 LAB — CBC WITH DIFFERENTIAL/PLATELET
Basophils Absolute: 0 10*3/uL (ref 0.0–0.2)
Basos: 1 %
EOS (ABSOLUTE): 0.1 10*3/uL (ref 0.0–0.4)
Eos: 2 %
Hematocrit: 40.4 % (ref 34.0–46.6)
Hemoglobin: 12.7 g/dL (ref 11.1–15.9)
Immature Grans (Abs): 0 10*3/uL (ref 0.0–0.1)
Immature Granulocytes: 0 %
Lymphocytes Absolute: 1.5 10*3/uL (ref 0.7–3.1)
Lymphs: 24 %
MCH: 27.7 pg (ref 26.6–33.0)
MCHC: 31.4 g/dL — ABNORMAL LOW (ref 31.5–35.7)
MCV: 88 fL (ref 79–97)
Monocytes Absolute: 0.4 10*3/uL (ref 0.1–0.9)
Monocytes: 6 %
Neutrophils Absolute: 4.2 10*3/uL (ref 1.4–7.0)
Neutrophils: 67 %
Platelets: 96 10*3/uL — CL (ref 150–450)
RBC: 4.58 x10E6/uL (ref 3.77–5.28)
RDW: 14.9 % (ref 11.7–15.4)
WBC: 6.2 10*3/uL (ref 3.4–10.8)

## 2020-11-17 LAB — COMPREHENSIVE METABOLIC PANEL
ALT: 22 IU/L (ref 0–32)
AST: 18 IU/L (ref 0–40)
Albumin/Globulin Ratio: 1.8 (ref 1.2–2.2)
Albumin: 4.4 g/dL (ref 3.8–4.9)
Alkaline Phosphatase: 109 IU/L (ref 44–121)
BUN/Creatinine Ratio: 10 (ref 9–23)
BUN: 10 mg/dL (ref 6–24)
Bilirubin Total: 0.5 mg/dL (ref 0.0–1.2)
CO2: 25 mmol/L (ref 20–29)
Calcium: 9.5 mg/dL (ref 8.7–10.2)
Chloride: 99 mmol/L (ref 96–106)
Creatinine, Ser: 1.03 mg/dL — ABNORMAL HIGH (ref 0.57–1.00)
Globulin, Total: 2.4 g/dL (ref 1.5–4.5)
Glucose: 236 mg/dL — ABNORMAL HIGH (ref 70–99)
Potassium: 4.1 mmol/L (ref 3.5–5.2)
Sodium: 140 mmol/L (ref 134–144)
Total Protein: 6.8 g/dL (ref 6.0–8.5)
eGFR: 65 mL/min/{1.73_m2} (ref >59)

## 2020-11-17 LAB — HEMOGLOBIN A1C
Est. average glucose Bld gHb Est-mCnc: 189 mg/dL
Hgb A1c MFr Bld: 8.2 % — ABNORMAL HIGH (ref 4.8–5.6)

## 2020-11-17 NOTE — Progress Notes (Signed)
Blood count normal except platelets improved. .  Liver function normal.  Kidney function normal.  Thyroid function normal.  Cholesterol: much worse. Trigs and ldl both much worse. Please ask if pt is taking pravastatin. If so, I would recommend we increase dose to 40 mg once daily at night. Recommend start vascepa 1 gm 2 capsules twice daily.  HBA1C: much worse. 5.8 increased to 8.2. We discussed changes below.  Increase trulicity 3 mg once weekly. Continue synjardy 12.5/1000mg once twice daily.  Started treating for binge eating disorder. I started her on vyvanse 40 mg once daily in am.

## 2020-11-18 ENCOUNTER — Telehealth: Payer: Self-pay

## 2020-11-18 ENCOUNTER — Other Ambulatory Visit: Payer: Self-pay

## 2020-11-18 DIAGNOSIS — Z1509 Genetic susceptibility to other malignant neoplasm: Secondary | ICD-10-CM

## 2020-11-18 NOTE — Chronic Care Management (AMB) (Signed)
Chronic Care Management Pharmacy Assistant   Name: Linda [name]  MRN: [id number] DOB: October 06, 1970   Reason for Encounter: Medication Coordination for [facility]    Recent office visits:  11/16/20 [name] MD. Seen for Diabetic Glomerulopathy. Started on Vyvanse 40 mg each morning. Changed Dulaglutide from 1.5 mg to 3 mg weekly. D/C Sucralfate 1 g.   Recent consult visits:  11/10/20 (Podiatry) Cannon Kettle, Titorya DPM. No med changes. Debridement performed.   Hospital visits:  None   Medications: Outpatient Encounter Medications as of 11/18/2020  Medication Sig Note   Blood Glucose Monitoring Suppl (ONETOUCH VERIO REFLECT) w/Device KIT AS DIRECTED    buPROPion (WELLBUTRIN XL) 300 MG 24 hr tablet TAKE ONE TABLET BY MOUTH EVERY EVENING    busPIRone (BUSPAR) 5 MG tablet TAKE ONE TABLET BY MOUTH THREE TIMES DAILY    calcium citrate-vitamin D (CITRACAL+D) 315-200 MG-UNIT tablet Take 1 tablet by mouth 2 (two) times daily.    clotrimazole-betamethasone (LOTRISONE) cream Apply small amount to affected area twice daily    Continuous Blood Gluc Receiver (FREESTYLE LIBRE 2 READER) DEVI E11.69 Check blood sugar 4 times daily as directed    Continuous Blood Gluc Sensor (FREESTYLE LIBRE 2 SENSOR) MISC E11.69 Change sensor every 14 days as directed    cyclobenzaprine (FLEXERIL) 10 MG tablet TAKE ONE TABLET BY MOUTH EVERY 8 HOURS AS NEEDED FOR MUSCLE SPASMS    dicyclomine (BENTYL) 20 MG tablet TAKE ONE TABLET BY MOUTH BEFORE MEALS AND AT BEDTIME AS NEEDED FOR STOMACH CRAMPING    Dulaglutide (TRULICITY) 3 BM/8.4XL SOPN Inject 3 mg as directed once a week.    Empagliflozin-metFORMIN HCl (SYNJARDY) 12.05-998 MG TABS Take 1 tablet by mouth 2 (two) times daily.    famotidine (PEPCID) 20 MG tablet TAKE ONE TABLET BY MOUTH TWICE DAILY    ferrous sulfate 325 (65 FE) MG tablet Take 325 mg by mouth every evening.    gabapentin (NEURONTIN) 300 MG capsule Take 2 capsules (600 mg total) by mouth 3  (three) times daily.    Levomilnacipran HCl ER (FETZIMA) 80 MG CP24 Take 1 capsule by mouth daily.    levothyroxine (SYNTHROID) 75 MCG tablet TAKE ONE TABLET BY MOUTH ONCE DAILY    lisdexamfetamine (VYVANSE) 40 MG capsule Take 1 capsule (40 mg total) by mouth every morning.    loratadine (CLARITIN) 10 MG tablet Take 10 mg by mouth daily. Taking 3-4 days each week due to bone pain associated with shot from cancer center.    losartan (COZAAR) 50 MG tablet TAKE ONE TABLET BY MOUTH EVERY EVENING    Magnesium 500 MG CAPS 500 mg.    morphine (MS CONTIN) 30 MG 12 hr tablet Take 1 tablet (30 mg total) by mouth every 12 (twelve) hours.    Multiple Vitamin (MULTIVITAMIN WITH MINERALS) TABS tablet Take 1 tablet by mouth daily.  06/08/2018: Pt plans on purchasing again when she can get out.   ondansetron (ZOFRAN) 4 MG tablet Take 1 tablet (4 mg total) by mouth every 4 (four) hours as needed for nausea.    OneTouch Delica Lancets 24M MISC 1 each by Does not apply route daily before breakfast. Check blood sugar twice daily.    pantoprazole (PROTONIX) 40 MG tablet Take 1 tablet (40 mg total) by mouth 2 (two) times daily.    Pegfilgrastim-jmdb (FULPHILA [place]) Inject into the skin.    potassium chloride (MICRO-K) 10 MEQ CR capsule Take 2 capsules (20 mEq total) by mouth 2 (two) times daily.  pravastatin (PRAVACHOL) 20 MG tablet Take 1 tablet (20 mg total) by mouth at bedtime.    prochlorperazine (COMPAZINE) 10 MG tablet Take 1 tablet (10 mg total) by mouth every 6 (six) hours as needed for nausea or vomiting.    rOPINIRole (REQUIP) 0.25 MG tablet TAKE ONE TABLET BY MOUTH EVERYDAY AT BEDTIME    senna (SENOKOT) 8.6 MG tablet Take 1 tablet by mouth daily as needed for constipation.    SODIUM FLUORIDE 5000 SENSITIVE 1.1-5 % GEL Take by mouth 2 (two) times daily.    Specialty Vitamins Products (MAGNESIUM, AMINO ACID CHELATE,) 133 MG tablet Take 1 tablet by mouth at bedtime.    Vitamin D, Ergocalciferol, (DRISDOL) 1.25  MG (50000 UNIT) CAPS capsule TAKE ONE CAPSULE BY MOUTH ONCE WEEKLY ON FRIDAY    zolpidem (AMBIEN) 10 MG tablet TAKE ONE TABLET BY MOUTH EVERYDAY AT BEDTIME AS NEEDED    No facility-administered encounter medications on file as of 11/18/2020.    Reviewed chart for medication changes ahead of medication coordination call.  No OVs, Consults, or hospital visits since last care coordination call/Pharmacist visit. (If appropriate, list visit date, provider name)  No medication changes indicated OR if recent visit, treatment plan here.  BP Readings from Last 3 Encounters:  11/16/20 118/68  08/19/20 (!) 145/65  07/24/20 110/70    Lab Results  Component Value Date   HGBA1C 8.2 (H) 11/16/2020     Patient obtains medications through Vials with safety caps  30 Days   Last adherence delivery included:  Ropinirole -0.25 mg- 1 tablet daily (bedtime) Synjardy- 12.5-1,000 mg - 1 tablet twice daily Pantoprazole -40 mg 1 tablet twice daily Zolpidem- 10 mg- 1 tablet daily (bedtime) Levothyroxine 75 mcg- 1 tablet daily Gabapentin 300 mg -2 capsule 3 times daily Losartan 50 mg- 1 tablet every evening  Bupropion XL 300 mg -1 tablet daily (bedtime) Famotidine 20 mg- 1 tablet twice daily Fetzima. ER 80 mg- 1 capsule daily Pravastatin 20 mg- 1 tablet daily (bedtime) Potassium CL ER 10 meq- 2 tablets twice a day( This needs to be in capsule form, pt cannot take tablets. I sent a message to Dr. Tobie Poet ) Buspirone 5 mg- 1 tab 3 times daily  Morphine ER 30 mg-  1 capsule every 12 hours Vitamin D 50,000 units- One capsule weekly (Friday) Freestyle kit 2 Sensor- Use as directed and change every 14 days.  Trulicity Inject 1.5 mg into the skin once a week Cyclobenzaprine 10 mg TAKE ONE TABLET BY MOUTH EVERY 8 HOURS AS NEEDED         Zofran 6m Take 1 tablet (4 mg total) by mouth every 4 (four) hours as needed       Patient is due for next adherence delivery on: 11/30/20 . Called patient and reviewed  medications and coordinated delivery.  This delivery to include: Ropinirole -0.25 mg- 1 tablet daily (bedtime) Synjardy- 12.5-1,000 mg - 1 tablet twice daily Pantoprazole -40 mg 1 tablet twice daily Zolpidem- 10 mg- 1 tablet daily (bedtime) Levothyroxine 75 mcg- 1 tablet daily Gabapentin 300 mg -2 capsule 3 times daily Losartan 50 mg- 1 tablet every evening  Bupropion XL 300 mg -1 tablet daily (bedtime) Famotidine 20 mg- 1 tablet twice daily Fetzima. ER 80 mg- 1 capsule daily Pravastatin 20 mg- 1 tablet daily (bedtime) Potassium CL ER 10 meq- 2 tablets twice  Buspirone 5 mg- 1 tab 3 times daily  Morphine ER 30 mg-  1 capsule every 12 hours Vitamin D  50,000 units- One capsule weekly (Friday) Freestyle kit 2 Sensor- Use as directed and change every 14 days.  Cyclobenzaprine 10 mg TAKE ONE TABLET BY MOUTH EVERY 8 HOURS AS NEEDED         Zofran 45m Take 1 tablet (4 mg total) by mouth every 4 (four) hours as needed     Lotrisone cream apply small amount to affected area twice daily   Waiting on PA  Trulicity Inject 3 mg into the skin once a week Vyvanse 40 mg   Declined meds: Compazine 10 mg- Pt has enough supply Magnesium 500 mg- Buys OTC Dicyclomine 20 mg- Pt has enough supply OneTouch Lancets- Pt has enough supply    Patient needs refills on: Sent provider a refill request  Gabapentin 300 mg  Fetzima. ER 80 mg Morphine ER 30 mg Pravastatin 20 mg Vitamin D 50,000 units Lotrisone Cream    Confirmed delivery date of 11/30/20, advised patient that pharmacy will contact them the morning of delivery.   DElray Mcgregor CSmyrnaPharmacist Assistant  [phone]

## 2020-11-19 ENCOUNTER — Telehealth: Payer: Self-pay | Admitting: Oncology

## 2020-11-19 ENCOUNTER — Other Ambulatory Visit: Payer: Self-pay | Admitting: Oncology

## 2020-11-19 ENCOUNTER — Encounter: Payer: Self-pay | Admitting: Oncology

## 2020-11-19 ENCOUNTER — Inpatient Hospital Stay (INDEPENDENT_AMBULATORY_CARE_PROVIDER_SITE_OTHER): Payer: PPO | Admitting: Oncology

## 2020-11-19 ENCOUNTER — Other Ambulatory Visit: Payer: Self-pay

## 2020-11-19 ENCOUNTER — Inpatient Hospital Stay: Payer: PPO | Attending: Oncology

## 2020-11-19 ENCOUNTER — Other Ambulatory Visit: Payer: Self-pay | Admitting: Family Medicine

## 2020-11-19 VITALS — BP 146/72 | HR 100 | Temp 98.3°F | Resp 18 | Ht 62.0 in | Wt 202.1 lb

## 2020-11-19 DIAGNOSIS — Z1501 Genetic susceptibility to malignant neoplasm of breast: Secondary | ICD-10-CM | POA: Insufficient documentation

## 2020-11-19 DIAGNOSIS — K7581 Nonalcoholic steatohepatitis (NASH): Secondary | ICD-10-CM | POA: Diagnosis not present

## 2020-11-19 DIAGNOSIS — D649 Anemia, unspecified: Secondary | ICD-10-CM | POA: Diagnosis not present

## 2020-11-19 DIAGNOSIS — D696 Thrombocytopenia, unspecified: Secondary | ICD-10-CM

## 2020-11-19 DIAGNOSIS — Z171 Estrogen receptor negative status [ER-]: Secondary | ICD-10-CM

## 2020-11-19 DIAGNOSIS — Z1509 Genetic susceptibility to other malignant neoplasm: Secondary | ICD-10-CM

## 2020-11-19 DIAGNOSIS — K7460 Unspecified cirrhosis of liver: Secondary | ICD-10-CM | POA: Diagnosis not present

## 2020-11-19 DIAGNOSIS — Z9013 Acquired absence of bilateral breasts and nipples: Secondary | ICD-10-CM | POA: Insufficient documentation

## 2020-11-19 DIAGNOSIS — D693 Immune thrombocytopenic purpura: Secondary | ICD-10-CM

## 2020-11-19 DIAGNOSIS — Z1502 Genetic susceptibility to malignant neoplasm of ovary: Secondary | ICD-10-CM | POA: Diagnosis not present

## 2020-11-19 DIAGNOSIS — C50811 Malignant neoplasm of overlapping sites of right female breast: Secondary | ICD-10-CM | POA: Insufficient documentation

## 2020-11-19 DIAGNOSIS — C50311 Malignant neoplasm of lower-inner quadrant of right female breast: Secondary | ICD-10-CM

## 2020-11-19 LAB — VITAMIN B12: Vitamin B-12: 256 pg/mL (ref 180–914)

## 2020-11-19 LAB — FOLATE: Folate: 15.3 ng/mL (ref >5.9)

## 2020-11-19 MED ORDER — ATORVASTATIN CALCIUM 10 MG PO TABS: 10.0000 mg | ORAL_TABLET | Freq: Every day | ORAL | 0 refills | Status: DC

## 2020-11-19 MED ORDER — GABAPENTIN 300 MG PO CAPS: 600.0000 mg | ORAL_CAPSULE | Freq: Three times a day (TID) | ORAL | 2 refills | Status: DC

## 2020-11-19 MED ORDER — FETZIMA 80 MG PO CP24: 1.0000 | ORAL_CAPSULE | Freq: Every day | ORAL | 1 refills | Status: DC

## 2020-11-19 MED ORDER — ICOSAPENT ETHYL 1 G PO CAPS: 2.0000 g | ORAL_CAPSULE | Freq: Two times a day (BID) | ORAL | 1 refills | Status: DC

## 2020-11-19 MED ORDER — MORPHINE SULFATE ER 30 MG PO TBCR: 30.0000 mg | EXTENDED_RELEASE_TABLET | Freq: Two times a day (BID) | ORAL | 0 refills | Status: DC

## 2020-11-19 MED ORDER — VITAMIN D (ERGOCALCIFEROL) 1.25 MG (50000 UNIT) PO CAPS: ORAL_CAPSULE | ORAL | 1 refills | Status: DC

## 2020-11-19 MED ORDER — PRAVASTATIN SODIUM 20 MG PO TABS: 20.0000 mg | ORAL_TABLET | Freq: Every day | ORAL | 1 refills | Status: DC

## 2020-11-19 NOTE — Telephone Encounter (Signed)
Per 10/22 los next appt scheduled and confirmed with patient

## 2020-11-19 NOTE — Telephone Encounter (Signed)
Compliant on meds, f/u one month.

## 2020-11-23 ENCOUNTER — Other Ambulatory Visit: Payer: Self-pay

## 2020-11-23 ENCOUNTER — Telehealth: Payer: Self-pay

## 2020-11-23 DIAGNOSIS — Z1501 Genetic susceptibility to malignant neoplasm of breast: Secondary | ICD-10-CM

## 2020-11-23 DIAGNOSIS — E1121 Type 2 diabetes mellitus with diabetic nephropathy: Secondary | ICD-10-CM | POA: Insufficient documentation

## 2020-11-23 DIAGNOSIS — F5081 Binge eating disorder: Secondary | ICD-10-CM | POA: Insufficient documentation

## 2020-11-23 DIAGNOSIS — N1832 Chronic kidney disease, stage 3b: Secondary | ICD-10-CM | POA: Insufficient documentation

## 2020-11-23 DIAGNOSIS — Z1509 Genetic susceptibility to other malignant neoplasm: Secondary | ICD-10-CM

## 2020-11-23 DIAGNOSIS — I129 Hypertensive chronic kidney disease with stage 1 through stage 4 chronic kidney disease, or unspecified chronic kidney disease: Secondary | ICD-10-CM | POA: Insufficient documentation

## 2020-11-23 MED ORDER — CLOTRIMAZOLE-BETAMETHASONE 1-0.05 % EX CREA: TOPICAL_CREAM | CUTANEOUS | 0 refills | Status: DC

## 2020-11-23 NOTE — Assessment & Plan Note (Signed)
BP is well controlled off medicine.  CKD stable.

## 2020-11-23 NOTE — Assessment & Plan Note (Signed)
Cholesterol: much worse. Trigs and ldl both much worse. Please ask if pt is taking pravastatin. If so, I would recommend we increase dose to 40 mg once daily at night. Recommend start vascepa 1 gm 2 capsules twice daily.

## 2020-11-23 NOTE — Assessment & Plan Note (Signed)
>>  ASSESSMENT AND PLAN FOR MODERATELY SEVERE RECURRENT MAJOR DEPRESSION (HCC) WRITTEN ON 11/23/2020  2:46 AM BY [name], KIRSTEN, MD  The current medical regimen is effective;  continue present plan and medications. Continue wellbutrin xl 300 mg once in am, buspirone 5 mg one tid.fetzima 80mg  once daily.

## 2020-11-23 NOTE — Assessment & Plan Note (Signed)
Surveillance by oncology.  Continue raloxifiene.

## 2020-11-23 NOTE — Assessment & Plan Note (Signed)
The current medical regimen is effective;  continue present plan and medications. Continue wellbutrin xl 300 mg once in am, buspirone 5 mg one tid.fetzima 4m once daily.

## 2020-11-23 NOTE — Assessment & Plan Note (Signed)
HBA1C: much worse. 5.8 increased to 8.2. We discussed changes below.  Increase trulicity 3 mg once weekly. Continue synjardy 12.5/1000mg once twice daily.  Started treating for binge eating disorder. I started her on vyvanse 40 mg once daily in am.

## 2020-11-23 NOTE — Assessment & Plan Note (Signed)
Pain mgmt contract signed. Plan UDS every 6 months.

## 2020-11-23 NOTE — Assessment & Plan Note (Signed)
The current medical regimen is effective;  continue present plan and medications.  

## 2020-11-23 NOTE — Assessment & Plan Note (Signed)
Surveillance by [facility].  Continue raloxifene.

## 2020-11-23 NOTE — Assessment & Plan Note (Signed)
Recommend continue to work on eating healthy diet. Treating binge eating disorder.  Comorbidites include DM, Hyperlipidemia, and NASH

## 2020-11-23 NOTE — Telephone Encounter (Signed)
Patient notified

## 2020-11-23 NOTE — Telephone Encounter (Signed)
-----   Message from Derwood Kaplan, MD sent at 11/22/2020 10:12 PM EDT ----- Regarding: call Tell her vitamin levels are okay

## 2020-11-23 NOTE — Assessment & Plan Note (Signed)
I started her on vyvanse 40 mg once daily in am.

## 2020-11-23 NOTE — Chronic Care Management (AMB) (Signed)
11/23/2020- Request sent to [facility] for refill of Lotrisone Cream to [facility] for delivery.  Pattricia Boss, [place] Pharmacist Assistant [phone]

## 2020-11-23 NOTE — Assessment & Plan Note (Signed)
Severe back pain.  Continue mscontin 30 bid.

## 2020-11-23 NOTE — Assessment & Plan Note (Signed)
Platelets remain 90-110. Stable.

## 2020-11-23 NOTE — Assessment & Plan Note (Signed)
Found during surveillance for her breast cancer.seeing liver specialist. Weight loss and good diabetic control is most important.

## 2020-11-27 ENCOUNTER — Encounter: Payer: Self-pay | Admitting: Hematology and Oncology

## 2020-12-01 ENCOUNTER — Telehealth: Payer: Self-pay

## 2020-12-01 NOTE — Telephone Encounter (Signed)
Patient approved through patient's insurance for the Vyvanse 83m.

## 2020-12-16 ENCOUNTER — Other Ambulatory Visit: Payer: Self-pay | Admitting: Family Medicine

## 2020-12-16 DIAGNOSIS — F5081 Binge eating disorder: Secondary | ICD-10-CM

## 2020-12-16 NOTE — Progress Notes (Signed)
No show

## 2020-12-21 ENCOUNTER — Ambulatory Visit (INDEPENDENT_AMBULATORY_CARE_PROVIDER_SITE_OTHER): Payer: PPO | Admitting: Family Medicine

## 2020-12-21 ENCOUNTER — Telehealth: Payer: Self-pay

## 2020-12-21 DIAGNOSIS — E782 Mixed hyperlipidemia: Secondary | ICD-10-CM

## 2020-12-21 DIAGNOSIS — F5081 Binge eating disorder: Secondary | ICD-10-CM

## 2020-12-21 DIAGNOSIS — E1121 Type 2 diabetes mellitus with diabetic nephropathy: Secondary | ICD-10-CM

## 2020-12-21 NOTE — Chronic Care Management (AMB) (Signed)
Chronic Care Management Pharmacy Assistant   Name: Linda [name]  MRN: [id number] DOB: 03/17/1970   Reason for Encounter: Medication Coordination for [facility]     Recent office visits:  12/21/20 Linda Brome MD. Seen for Diabetic Glomerulopathy. No med changes.  11/19/20 Orders Only. D/C pravastatin 20 mg and Started Atorvastatin 10 mg daily.   Recent consult visits:  11/19/20 (Oncology) Linda Poisson MD. Seen for Malignant neoplasm. No med changes.   Hospital visits:  None   Medications: Outpatient Encounter Medications as of 12/21/2020  Medication Sig Note   atorvastatin (LIPITOR) 10 MG tablet Take 1 tablet (10 mg total) by mouth daily.    Blood Glucose Monitoring Suppl (ONETOUCH VERIO REFLECT) w/Device KIT AS DIRECTED    buPROPion (WELLBUTRIN XL) 300 MG 24 hr tablet TAKE ONE TABLET BY MOUTH EVERY EVENING    busPIRone (BUSPAR) 5 MG tablet TAKE ONE TABLET BY MOUTH THREE TIMES DAILY    calcium citrate-vitamin D (CITRACAL+D) 315-200 MG-UNIT tablet Take 1 tablet by mouth 2 (two) times daily.    clotrimazole-betamethasone (LOTRISONE) cream Apply small amount to affected area twice daily    Continuous Blood Gluc Receiver (FREESTYLE LIBRE 2 READER) DEVI E11.69 Check blood sugar 4 times daily as directed    Continuous Blood Gluc Sensor (FREESTYLE LIBRE 2 SENSOR) MISC E11.69 Change sensor every 14 days as directed    cyclobenzaprine (FLEXERIL) 10 MG tablet TAKE ONE TABLET BY MOUTH EVERY 8 HOURS AS NEEDED FOR MUSCLE SPASMS    dicyclomine (BENTYL) 20 MG tablet TAKE ONE TABLET BY MOUTH BEFORE MEALS AND AT BEDTIME AS NEEDED FOR STOMACH CRAMPING    Dulaglutide (TRULICITY) 3 WJ/1.9JY SOPN Inject 3 mg as directed once a week.    Empagliflozin-metFORMIN HCl (SYNJARDY) 12.05-998 MG TABS Take 1 tablet by mouth 2 (two) times daily.    famotidine (PEPCID) 20 MG tablet TAKE ONE TABLET BY MOUTH TWICE DAILY    ferrous sulfate 325 (65 FE) MG tablet Take 325 mg by mouth every evening.     gabapentin (NEURONTIN) 300 MG capsule Take 2 capsules (600 mg total) by mouth 3 (three) times daily.    icosapent Ethyl (VASCEPA) 1 g capsule Take 2 capsules (2 g total) by mouth 2 (two) times daily.    Levomilnacipran HCl ER (FETZIMA) 80 MG CP24 Take 1 capsule by mouth daily.    levothyroxine (SYNTHROID) 75 MCG tablet TAKE ONE TABLET BY MOUTH ONCE DAILY    loratadine (CLARITIN) 10 MG tablet Take 10 mg by mouth daily. Taking 3-4 days each week due to bone pain associated with shot from cancer center.    losartan (COZAAR) 50 MG tablet TAKE ONE TABLET BY MOUTH EVERY EVENING    Magnesium 500 MG CAPS 500 mg.    morphine (MS CONTIN) 30 MG 12 hr tablet Take 1 tablet (30 mg total) by mouth every 12 (twelve) hours.    Multiple Vitamin (MULTIVITAMIN WITH MINERALS) TABS tablet Take 1 tablet by mouth daily.  06/08/2018: Pt plans on purchasing again when she can get out.   ondansetron (ZOFRAN) 4 MG tablet Take 1 tablet (4 mg total) by mouth every 4 (four) hours as needed for nausea.    OneTouch Delica Lancets 78G MISC 1 each by Does not apply route daily before breakfast. Check blood sugar twice daily.    pantoprazole (PROTONIX) 40 MG tablet Take 1 tablet (40 mg total) by mouth 2 (two) times daily.    Pegfilgrastim-jmdb (FULPHILA [place]) Inject into the skin.  potassium chloride (MICRO-K) 10 MEQ CR capsule Take 2 capsules (20 mEq total) by mouth 2 (two) times daily.    prochlorperazine (COMPAZINE) 10 MG tablet Take 1 tablet (10 mg total) by mouth every 6 (six) hours as needed for nausea or vomiting.    rOPINIRole (REQUIP) 0.25 MG tablet TAKE ONE TABLET BY MOUTH EVERYDAY AT BEDTIME    senna (SENOKOT) 8.6 MG tablet Take 1 tablet by mouth daily as needed for constipation.    SODIUM FLUORIDE 5000 SENSITIVE 1.1-5 % GEL Take by mouth 2 (two) times daily.    Specialty Vitamins Products (MAGNESIUM, AMINO ACID CHELATE,) 133 MG tablet Take 1 tablet by mouth at bedtime.    Vitamin D, Ergocalciferol, (DRISDOL) 1.25 MG  (50000 UNIT) CAPS capsule TAKE ONE CAPSULE BY MOUTH ONCE WEEKLY ON FRIDAY    VYVANSE 40 MG capsule TAKE ONE CAPSULE BY MOUTH EVERY MORNING    zolpidem (AMBIEN) 10 MG tablet TAKE ONE TABLET BY MOUTH EVERYDAY AT BEDTIME AS NEEDED    No facility-administered encounter medications on file as of 12/21/2020.   Reviewed chart for medication changes ahead of medication coordination call.  No OVs, Consults, or hospital visits since last care coordination call/Pharmacist visit. (If appropriate, list visit date, provider name)  No medication changes indicated OR if recent visit, treatment plan here.  BP Readings from Last 3 Encounters:  11/19/20 (!) 146/72  11/16/20 118/68  08/19/20 (!) 145/65    Lab Results  Component Value Date   HGBA1C 8.2 (H) 11/16/2020     Patient obtains medications through Vials with safety caps 30 Days   Last adherence delivery included:  Ropinirole -0.25 mg- 1 tablet daily (bedtime) Synjardy- 12.5-1,000 mg - 1 tablet twice daily Pantoprazole -40 mg 1 tablet twice daily Zolpidem- 10 mg- 1 tablet daily (bedtime) Levothyroxine 75 mcg- 1 tablet daily Gabapentin 300 mg -2 capsule 3 times daily Losartan 50 mg- 1 tablet every evening  Bupropion XL 300 mg -1 tablet daily (bedtime) Famotidine 20 mg- 1 tablet twice daily Fetzima. ER 80 mg- 1 capsule daily Pravastatin 20 mg- 1 tablet daily (bedtime) Potassium CL ER 10 meq- 2 tablets twice  Buspirone 5 mg- 1 tab 3 times daily  Morphine ER 30 mg-  1 capsule every 12 hours Vitamin D 50,000 units- One capsule weekly (Friday) Freestyle kit 2 Sensor- Use as directed and change every 14 days.  Cyclobenzaprine 10 mg TAKE ONE TABLET BY MOUTH EVERY 8 HOURS AS NEEDED         Zofran 3m Take 1 tablet (4 mg total) by mouth every 4 (four) hours as needed     Lotrisone cream apply small amount to affected area twice daily   Patient declined (meds) last month Compazine 10 mg- Pt has enough supply Magnesium 500 mg- Buys  OTC Dicyclomine 20 mg- Pt has enough supply OneTouch Lancets- Pt has enough supply   Patient is due for next adherence delivery on: 12/30/20 . Called patient and reviewed medications and coordinated delivery.  This delivery to include: Ropinirole -0.25 mg- 1 tablet daily (bedtime) Synjardy- 12.5-1,000 mg - 1 tablet twice daily Pantoprazole -40 mg 1 tablet twice daily Zolpidem- 10 mg- 1 tablet daily (bedtime) Levothyroxine 75 mcg- 1 tablet daily Gabapentin 300 mg -2 capsule 3 times daily Losartan Potassium 50 mg- 1 tablet every evening  Bupropion XL 300 mg -1 tablet daily (bedtime) Famotidine 20 mg- 1 tablet twice daily Fetzima. ER 80 mg- 1 capsule daily Pravastatin 20 mg- 1 tablet daily (bedtime)  Potassium CL ER 10 meq- 2 tablets twice  Buspirone 5 mg- 1 tab 3 times daily  Morphine ER 30 mg-  1 capsule every 12 hours Vitamin D 50,000 units- One capsule weekly (Friday) Freestyle kit 2 Sensor- Use as directed and change every 14 days.  Cyclobenzaprine 10 mg TAKE ONE TABLET BY MOUTH EVERY 8 HOURS AS NEEDED         Zofran 36m Take 1 tablet (4 mg total) by mouth every 4 (four) hours as needed     Lotrisone cream apply small amount to affected area twice daily  Vascepa 1gm 2 capsules twice daily Atorvastatin 10 mg 1 tab once daily   Vyvanse and Trulicity are due on 170/26and pharmacy will schedule to deliver these.  Patient declined the following medications (meds) due to (reason)  Patient needs refills for .  Confirmed delivery date of 12/30/20, advised patient that pharmacy will contact them the morning of delivery.    DElray Mcgregor CFlorida CityPharmacist Assistant  [phone] I have not been able to speak with pt after several attempts have been made

## 2020-12-23 ENCOUNTER — Other Ambulatory Visit: Payer: Self-pay | Admitting: Family Medicine

## 2020-12-23 DIAGNOSIS — G2581 Restless legs syndrome: Secondary | ICD-10-CM

## 2020-12-23 DIAGNOSIS — Z1501 Genetic susceptibility to malignant neoplasm of breast: Secondary | ICD-10-CM

## 2021-01-15 ENCOUNTER — Other Ambulatory Visit: Payer: Self-pay | Admitting: Hematology and Oncology

## 2021-01-15 ENCOUNTER — Other Ambulatory Visit: Payer: Self-pay | Admitting: Family Medicine

## 2021-01-15 DIAGNOSIS — M62838 Other muscle spasm: Secondary | ICD-10-CM

## 2021-01-15 DIAGNOSIS — F5081 Binge eating disorder: Secondary | ICD-10-CM

## 2021-01-15 MED ORDER — CYCLOBENZAPRINE HCL 10 MG PO TABS: ORAL_TABLET | ORAL | 0 refills | Status: DC

## 2021-01-16 DIAGNOSIS — R079 Chest pain, unspecified: Secondary | ICD-10-CM | POA: Diagnosis not present

## 2021-01-19 ENCOUNTER — Telehealth: Payer: Self-pay

## 2021-01-19 NOTE — Chronic Care Management (AMB) (Addendum)
Chronic Care Management Pharmacy Assistant   Name: Linda [name]  MRN: [id number] DOB: Mar 04, 1970   Reason for Encounter: Medication Review/ Medication coordination  Recent office visits:  None  Recent consult visits:  11-19-2020 Derwood Kaplan, MD (Oncology). Follow up in 3 months.  Hospital visits:  None in previous 6 months  Medications: Outpatient Encounter Medications as of 01/19/2021  Medication Sig Note   atorvastatin (LIPITOR) 10 MG tablet Take 1 tablet (10 mg total) by mouth daily.    Blood Glucose Monitoring Suppl (ONETOUCH VERIO REFLECT) w/Device KIT AS DIRECTED    buPROPion (WELLBUTRIN XL) 300 MG 24 hr tablet TAKE ONE TABLET BY MOUTH EVERY EVENING    busPIRone (BUSPAR) 5 MG tablet TAKE ONE TABLET BY MOUTH THREE TIMES DAILY    calcium citrate-vitamin D (CITRACAL+D) 315-200 MG-UNIT tablet Take 1 tablet by mouth 2 (two) times daily.    clotrimazole-betamethasone (LOTRISONE) cream Apply small amount to affected area twice daily    Continuous Blood Gluc Receiver (FREESTYLE LIBRE 2 READER) DEVI E11.69 Check blood sugar 4 times daily as directed    Continuous Blood Gluc Sensor (FREESTYLE LIBRE 2 SENSOR) MISC E11.69 Change sensor every 14 days as directed    cyclobenzaprine (FLEXERIL) 10 MG tablet TAKE ONE TABLET BY MOUTH EVERY 8 HOURS AS NEEDED FOR MUSCLE SPASM    dicyclomine (BENTYL) 20 MG tablet TAKE ONE TABLET BY MOUTH BEFORE MEALS AND AT BEDTIME AS NEEDED FOR STOMACH CRAMPING    Dulaglutide (TRULICITY) 3 OF/7.5ZW SOPN Inject 3 mg as directed once a week.    Empagliflozin-metFORMIN HCl (SYNJARDY) 12.05-998 MG TABS Take 1 tablet by mouth 2 (two) times daily.    famotidine (PEPCID) 20 MG tablet TAKE ONE TABLET BY MOUTH TWICE DAILY    ferrous sulfate 325 (65 FE) MG tablet Take 325 mg by mouth every evening.    gabapentin (NEURONTIN) 300 MG capsule Take 2 capsules (600 mg total) by mouth 3 (three) times daily.    icosapent Ethyl (VASCEPA) 1 g capsule Take 2  capsules (2 g total) by mouth 2 (two) times daily.    Levomilnacipran HCl ER (FETZIMA) 80 MG CP24 Take 1 capsule by mouth daily.    levothyroxine (SYNTHROID) 75 MCG tablet TAKE ONE TABLET BY MOUTH ONCE DAILY    loratadine (CLARITIN) 10 MG tablet Take 10 mg by mouth daily. Taking 3-4 days each week due to bone pain associated with shot from cancer center.    losartan (COZAAR) 50 MG tablet TAKE ONE TABLET BY MOUTH EVERY EVENING    Magnesium 500 MG CAPS 500 mg.    morphine (MS CONTIN) 30 MG 12 hr tablet TAKE ONE TABLET BY MOUTH EVERY TWELVE HOURS    Multiple Vitamin (MULTIVITAMIN WITH MINERALS) TABS tablet Take 1 tablet by mouth daily.  06/08/2018: Pt plans on purchasing again when she can get out.   ondansetron (ZOFRAN) 4 MG tablet Take 1 tablet (4 mg total) by mouth every 4 (four) hours as needed for nausea.    OneTouch Delica Lancets 25E MISC 1 each by Does not apply route daily before breakfast. Check blood sugar twice daily.    pantoprazole (PROTONIX) 40 MG tablet Take 1 tablet (40 mg total) by mouth 2 (two) times daily.    Pegfilgrastim-jmdb (FULPHILA [place]) Inject into the skin.    potassium chloride (MICRO-K) 10 MEQ CR capsule Take 2 capsules (20 mEq total) by mouth 2 (two) times daily.    prochlorperazine (COMPAZINE) 10 MG tablet Take 1 tablet (  10 mg total) by mouth every 6 (six) hours as needed for nausea or vomiting.    rOPINIRole (REQUIP) 0.25 MG tablet TAKE ONE TABLET BY MOUTH EVERYDAY AT BEDTIME    senna (SENOKOT) 8.6 MG tablet Take 1 tablet by mouth daily as needed for constipation.    SODIUM FLUORIDE 5000 SENSITIVE 1.1-5 % GEL Take by mouth 2 (two) times daily.    Specialty Vitamins Products (MAGNESIUM, AMINO ACID CHELATE,) 133 MG tablet Take 1 tablet by mouth at bedtime.    Vitamin D, Ergocalciferol, (DRISDOL) 1.25 MG (50000 UNIT) CAPS capsule TAKE ONE CAPSULE BY MOUTH ONCE WEEKLY ON FRIDAY    VYVANSE 40 MG capsule TAKE ONE CAPSULE BY MOUTH EVERY MORNING    zolpidem (AMBIEN) 10 MG  tablet TAKE ONE TABLET BY MOUTH EVERYDAY AT BEDTIME AS NEEDED    No facility-administered encounter medications on file as of 01/19/2021.  Reviewed chart for medication changes ahead of medication coordination call.  No OVs, Consults, or hospital visits since last care coordination call/Pharmacist visit. (If appropriate, list visit date, provider name)  No medication changes indicated OR if recent visit, treatment plan here.  BP Readings from Last 3 Encounters:  11/19/20 (!) 146/72  11/16/20 118/68  08/19/20 (!) 145/65    Lab Results  Component Value Date   HGBA1C 8.2 (H) 11/16/2020     Patient obtains medications through Vials  30 Days   Last adherence delivery included:  Ropinirole -0.25 mg- 1 tablet daily (bedtime) Synjardy- 12.5-1,000 mg - 1 tablet twice daily Pantoprazole -40 mg 1 tablet twice daily Zolpidem- 10 mg- 1 tablet daily (bedtime) Levothyroxine 75 mcg- 1 tablet daily Gabapentin 300 mg -2 capsule 3 times daily Losartan Potassium 50 mg- 1 tablet every evening  Bupropion XL 300 mg -1 tablet daily (bedtime) Famotidine 20 mg- 1 tablet twice daily Fetzima. ER 80 mg- 1 capsule daily Pravastatin 20 mg- 1 tablet daily (bedtime) Potassium CL ER 10 meq- 2 tablets twice  Buspirone 5 mg- 1 tab 3 times daily  Morphine ER 30 mg-  1 capsule every 12 hours Vitamin D 50,000 units- One capsule weekly (Friday) Freestyle kit 2 Sensor- Use as directed and change every 14 days.  Cyclobenzaprine 10 mg TAKE ONE TABLET BY MOUTH EVERY 8 HOURS AS NEEDED         Zofran 35m Take 1 tablet (4 mg total) by mouth every 4 (four) hours as needed     Lotrisone cream apply small amount to affected area twice daily  Vascepa 1gm 2 capsules twice daily Atorvastatin 10 mg 1 tab once daily    Vyvanse and Trulicity are due on 185/27and pharmacy will schedule to deliver these.  Patient declined (meds) last month: Unable to reach patient  Patient is due for next adherence delivery on:  01-29-2020  Called patient and reviewed medications and coordinated delivery.  This delivery to include: Buspirone 5 mg- 1 tab 3 times daily  Morphine ER 30 mg-  1 capsule every 12 hours Clotrim-beta cream twice daily Ropinirole -0.25 mg- 1 tablet daily (bedtime) Zolpidem- 10 mg- 1 tablet daily (bedtime) Potassium CL ER 10 meq- 2 tablets twice daily Losartan Potassium 50 mg- 1 tablet every evening  Famotidine 20 mg- 1 tablet twice daily Bupropion XL 300 mg -1 tablet daily (bedtime) Protonix 40 mg- 1 tablet before breakfast and before evening meal Levothyroxine 75 mcg- 1 tablet daily Fetzima. ER 80 mg- 1 capsule daily Gabapentin 300 mg -2 capsule 3 times daily Zofran 46mTake  1 tablet (4 mg total) by mouth every 4 (four) hours as needed     Pravastatin 20 mg- 1 tablet daily (bedtime) Synjardy- 12.5-1,000 mg - 1 tablet twice daily Icosapent ethyl 10 mg- daily Atorvastatin 10 mg 1 tab once daily  Freestyle kit 2 Sensor- Use as directed and change every 14 days.  Vitamin D2 1250 mcg- once every Friday.  Vyvanse and Trulicity are due on 65/99 and pharmacy will schedule to deliver these.   01-19-2021: Patient stated she has an appointment tomorrow with Dr. Tobie Poet and will probably be prescribed another type of insulin.Will follow up with patient after appointment.  01-20-2021: Patient stated Dr.[name] started tresiba and diflucan. Started an acute fill form.  Patient declined the following medications: None  Patient needs refills for: Freestyle kit 2 Sensor Potassium CL ER Morphine ER  Confirmed delivery date of 01-29-2020 advised patient that pharmacy will contact them the morning of delivery.   Care Gaps: HIV Screening overdue Hep C screening overdue Tdap overdue Shingrix overdue PNA Vac overdue last completed 05-05-2013 Mammogram overdue last completed 05-30-2017 PAP smear overdue last completed 06-10-2016 Yearly foot exam overdue last completed 10-09-2019    [facility]: Losartan 50 mg- Last filled 12-23-2020 30 DS [facility] Synjardy 12.05-998 mg- Last filled 12-23-2020 30 [facility] Trulicity 3 mg- Last filled 12-22-2020 28 DS [facility]  [place] Pharmacist Assistant [phone]

## 2021-01-19 NOTE — Progress Notes (Signed)
Subjective:  Patient ID: Linda [name], female    DOB: Feb 11, 1970  Age: 50 y.o. MRN: [id number]  Chief Complaint  Patient presents with   Hyperglycemia    HPI Patient was seen at [facility] ED on 01/16/2021 for Hyperglycemia, presented with polydipsia and discomfort in her mouth, CMP showed sugar was 525. Her Meter showed HI x 48 hours. Treated for thrush with nystatin four times a day x 1 week  ON synjardy xr 84.01/6604 2 daily, on trulicity 3 mg weekly. Pt was off insulin, but ED put her back on lantus 52 units at night. Sugar 166 today. Yesterday was 175-275. Has CGM.   Patient also c/o yeast infx symptoms in thrush and otc monistat for vagina.   Has occasional chest pain with anxiety. Not exerting herself. Lasts for 2-3 minutes.   Current Outpatient Medications on File Prior to Visit  Medication Sig Dispense Refill   insulin glargine (LANTUS) 100 UNIT/ML injection Inject 52 Units into the skin daily.     atorvastatin (LIPITOR) 10 MG tablet Take 1 tablet (10 mg total) by mouth daily. 90 tablet 0   Blood Glucose Monitoring Suppl (ONETOUCH VERIO REFLECT) w/Device KIT AS DIRECTED     buPROPion (WELLBUTRIN XL) 300 MG 24 hr tablet TAKE ONE TABLET BY MOUTH EVERY EVENING 90 tablet 1   busPIRone (BUSPAR) 5 MG tablet TAKE ONE TABLET BY MOUTH THREE TIMES DAILY 90 tablet 2   calcium citrate-vitamin D (CITRACAL+D) 315-200 MG-UNIT tablet Take 1 tablet by mouth 2 (two) times daily.     clotrimazole-betamethasone (LOTRISONE) cream Apply small amount to affected area twice daily 45 g 2   Continuous Blood Gluc Receiver (FREESTYLE LIBRE 2 READER) DEVI E11.69 Check blood sugar 4 times daily as directed 1 each 0   cyclobenzaprine (FLEXERIL) 10 MG tablet TAKE ONE TABLET BY MOUTH EVERY 8 HOURS AS NEEDED FOR MUSCLE SPASM 30 tablet 3   dicyclomine (BENTYL) 20 MG tablet TAKE ONE TABLET BY MOUTH BEFORE MEALS AND AT BEDTIME AS NEEDED FOR STOMACH CRAMPING 180 tablet 1   Dulaglutide (TRULICITY) 3 TK/1.6WF SOPN  Inject 3 mg as directed once a week. 2 mL 2   Empagliflozin-metFORMIN HCl (SYNJARDY) 12.05-998 MG TABS Take 1 tablet by mouth 2 (two) times daily. 180 tablet 0   famotidine (PEPCID) 20 MG tablet TAKE ONE TABLET BY MOUTH TWICE DAILY 180 tablet 1   ferrous sulfate 325 (65 FE) MG tablet Take 325 mg by mouth every evening.     gabapentin (NEURONTIN) 300 MG capsule Take 2 capsules (600 mg total) by mouth 3 (three) times daily. 180 capsule 2   icosapent Ethyl (VASCEPA) 1 g capsule Take 2 capsules (2 g total) by mouth 2 (two) times daily. 360 capsule 1   Levomilnacipran HCl ER (FETZIMA) 80 MG CP24 Take 1 capsule by mouth daily. 90 capsule 1   levothyroxine (SYNTHROID) 75 MCG tablet TAKE ONE TABLET BY MOUTH ONCE DAILY 90 tablet 1   loratadine (CLARITIN) 10 MG tablet Take 10 mg by mouth daily. Taking 3-4 days each week due to bone pain associated with shot from cancer center.     losartan (COZAAR) 50 MG tablet TAKE ONE TABLET BY MOUTH EVERY EVENING 90 tablet 1   Magnesium 500 MG CAPS 500 mg.     Multiple Vitamin (MULTIVITAMIN WITH MINERALS) TABS tablet Take 1 tablet by mouth daily.      ondansetron (ZOFRAN) 4 MG tablet Take 1 tablet (4 mg total) by mouth every 4 (four) hours  as needed for nausea. 90 tablet 3   OneTouch Delica Lancets 24Q MISC 1 each by Does not apply route daily before breakfast. Check blood sugar twice daily. 100 each 3   pantoprazole (PROTONIX) 40 MG tablet Take 1 tablet (40 mg total) by mouth 2 (two) times daily. 180 tablet 1   Pegfilgrastim-jmdb (FULPHILA [place]) Inject into the skin.     prochlorperazine (COMPAZINE) 10 MG tablet Take 1 tablet (10 mg total) by mouth every 6 (six) hours as needed for nausea or vomiting. 90 tablet 3   rOPINIRole (REQUIP) 0.25 MG tablet TAKE ONE TABLET BY MOUTH EVERYDAY AT BEDTIME 30 tablet 2   senna (SENOKOT) 8.6 MG tablet Take 1 tablet by mouth daily as needed for constipation.     SODIUM FLUORIDE 5000 SENSITIVE 1.1-5 % GEL Take by mouth 2 (two) times  daily.     Specialty Vitamins Products (MAGNESIUM, AMINO ACID CHELATE,) 133 MG tablet Take 1 tablet by mouth at bedtime.     Vitamin D, Ergocalciferol, (DRISDOL) 1.25 MG (50000 UNIT) CAPS capsule TAKE ONE CAPSULE BY MOUTH ONCE WEEKLY ON FRIDAY 12 capsule 1   VYVANSE 40 MG capsule TAKE ONE CAPSULE BY MOUTH EVERY MORNING 30 capsule 0   zolpidem (AMBIEN) 10 MG tablet TAKE ONE TABLET BY MOUTH EVERYDAY AT BEDTIME AS NEEDED 30 tablet 5   No current facility-administered medications on file prior to visit.   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 No longer has   Past Surgical History:  Procedure Laterality Date   APPENDECTOMY     BACK SURGERY     x 3 lower disc   BILATERAL TOTAL MASTECTOMY WITH AXILLARY LYMPH NODE DISSECTION Bilateral 09/2019   CHOLECYSTECTOMY     nephrolithiasis     ROBOTIC ASSISTED TOTAL HYSTERECTOMY WITH BILATERAL SALPINGO OOPHERECTOMY Bilateral 06/14/2016   Procedure: ROBOTIC ASSISTED TOTAL HYSTERECTOMY WITH BILATERAL SALPINGO OOPHORECTOMY;  Surgeon: Nancy Marus, MD;  Location: [facility];  Service:  Gynecology;  Laterality: Bilateral;    Family History  Problem Relation Age of Onset   Cancer Mother        breast   CAD Father    Diabetes Father    Heart failure Father    Cancer Father        renal carcinoma   Kidney failure Father    Stroke Paternal Grandmother    Social History   Socioeconomic History   Marital status: Divorced    Spouse name: Not  on file   Number of children: Not on file   Years of education: Not on file   Highest education level: Not on file  Occupational History   Occupation: disabled  Tobacco Use   Smoking status: Never   Smokeless tobacco: Never  Substance and Sexual Activity   Alcohol use: No   Drug use: No   Sexual activity: Yes  Other Topics Concern   Not on file  Social History Narrative   wears sunscreen, brushes and flosses daily, see's dentist bi-annually, has smoke/carbon monoxide detectors, wears a seatbelt and practices gun safety   Social Determinants of Health   Financial Resource Strain: Not on file  Food Insecurity: Not on file  Transportation Needs: Not on file  Physical Activity: Not on file  Stress: Not on file  Social Connections: Not on file    Review of Systems  Constitutional:  Positive for fatigue. Negative for appetite change and fever.  HENT:  Negative for congestion, ear pain, sinus pressure and sore throat.   Eyes:  Negative for pain.  Respiratory:  Positive for shortness of breath (cxr was normal). Negative for cough, chest tightness and wheezing.   Cardiovascular:  Positive for chest pain. Negative for palpitations.  Gastrointestinal:  Positive for nausea. Negative for abdominal pain, constipation, diarrhea and vomiting.  Endocrine: Positive for polydipsia.  Genitourinary:  Negative for dysuria and hematuria.  Musculoskeletal:  Positive for arthralgias, back pain and myalgias. Negative for joint swelling.  Skin:  Negative for rash.  Neurological:  Negative for dizziness, weakness and headaches.   Psychiatric/Behavioral:  Negative for dysphoric mood. The patient is not nervous/anxious.     Objective:  BP 126/78    Pulse (!) 102    Temp (!) 97.1 F (36.2 C)    Ht _0  (1.575 m)    Wt 193 lb (87.5 kg)    LMP 06/13/2009    SpO2 96%    BMI 35.30 kg/m   BP/Weight 01/20/2021 11/19/2020 45/36/4680  Systolic BP 321 224 825  Diastolic BP 78 72 68  Wt. (Lbs) 193 202.1 200  BMI 35.3 36.96 36.58    Physical Exam Vitals reviewed.  Constitutional:      Appearance: Normal appearance. She is normal weight.  Cardiovascular:     Rate and Rhythm: Normal rate and regular rhythm.     Heart sounds: Normal heart sounds.  Pulmonary:     Effort: Pulmonary effort is normal. No respiratory distress.     Breath sounds: Normal breath sounds.  Abdominal:     General: Abdomen is flat. Bowel sounds are normal.     Palpations: Abdomen is soft.     Tenderness: There is no abdominal tenderness.  Neurological:     Mental Status: She is alert and oriented to person, place, and time.  Psychiatric:        Mood and Affect: Mood normal.        Behavior: Behavior normal.    Diabetic Foot Exam - Simple   No data filed      Lab Results  Component Value Date   WBC 6.2 11/16/2020   HGB 12.7 11/16/2020   HCT 40.4 11/16/2020   PLT 96 (LL) 11/16/2020   GLUCOSE 236 (H) 11/16/2020   CHOL 227 (H) 11/16/2020   TRIG 284 (H) 11/16/2020   HDL 46 11/16/2020   LDLCALC 130 (H) 11/16/2020   ALT 22 11/16/2020   AST 18 11/16/2020   NA 140 11/16/2020   K 4.1 11/16/2020  CL 99 11/16/2020   CREATININE 1.03 (H) 11/16/2020   BUN 10 11/16/2020   CO2 25 11/16/2020   TSH 1.440 07/24/2020   HGBA1C 8.2 (H) 11/16/2020   MICROALBUR 30 10/09/2019      Assessment & Plan:   Problem List Items Addressed This Visit       Digestive   Thrush    Continue nystatin      Relevant Medications   fluconazole (DIFLUCAN) 150 MG tablet     Endocrine   Type 2 diabetes mellitus with hyperglycemia (HCC) - Primary     Change Lantus to [place] 52 units daily based on formulary.  Patient may use of Lantus.  I cautioned the patient to monitor for improving sugars and swelling towards hypoglycemia.  Patient has significantly splurged on nondiet beverages and has given those up now.      Relevant Medications   insulin glargine (LANTUS) 100 UNIT/ML injection   insulin degludec (TRESIBA FLEXTOUCH) 200 UNIT/ML FlexTouch Pen     Genitourinary   Vaginal candidiasis    Fluconazole 150 mg once daily x1.  Given second pill in case has not resolved.      Relevant Medications   fluconazole (DIFLUCAN) 150 MG tablet     Other   Atypical chest pain    Mild, intermittent.  Likely secondary to anxiety.  May need repeat echocardiogram but patient is scheduled to see oncology soon and they were planning on ordering this     .  Meds ordered this encounter  Medications   fluconazole (DIFLUCAN) 150 MG tablet    Sig: Take 1 tablet (150 mg total) by mouth once for 1 dose.    Dispense:  2 tablet    Refill:  0   insulin degludec (TRESIBA FLEXTOUCH) 200 UNIT/ML FlexTouch Pen    Sig: Inject 52 Units into the skin daily.    Dispense:  9 mL    Refill:  3    No orders of the defined types were placed in this encounter.    Follow-up: Return for chronic fasting a previously scheduled appointment coming up..  An After Visit Summary was printed and given to the patient.  I,Lauren M Auman,acting as a scribe for [name], MD.,have documented all relevant documentation on the behalf of [name], MD,as directed by  [name], MD while in the presence of [name], MD.   [name], MD [place] [phone]

## 2021-01-20 ENCOUNTER — Other Ambulatory Visit: Payer: Self-pay

## 2021-01-20 ENCOUNTER — Ambulatory Visit (INDEPENDENT_AMBULATORY_CARE_PROVIDER_SITE_OTHER): Payer: PPO | Admitting: Family Medicine

## 2021-01-20 ENCOUNTER — Encounter: Payer: Self-pay | Admitting: Family Medicine

## 2021-01-20 ENCOUNTER — Other Ambulatory Visit: Payer: Self-pay | Admitting: Family Medicine

## 2021-01-20 VITALS — BP 126/78 | HR 102 | Temp 97.1°F | Ht 62.0 in | Wt 193.0 lb

## 2021-01-20 DIAGNOSIS — B370 Candidal stomatitis: Secondary | ICD-10-CM | POA: Diagnosis not present

## 2021-01-20 DIAGNOSIS — E1165 Type 2 diabetes mellitus with hyperglycemia: Secondary | ICD-10-CM

## 2021-01-20 DIAGNOSIS — R0789 Other chest pain: Secondary | ICD-10-CM | POA: Diagnosis not present

## 2021-01-20 DIAGNOSIS — B3731 Acute candidiasis of vulva and vagina: Secondary | ICD-10-CM | POA: Diagnosis not present

## 2021-01-20 DIAGNOSIS — Z794 Long term (current) use of insulin: Secondary | ICD-10-CM

## 2021-01-20 MED ORDER — FLUCONAZOLE 150 MG PO TABS: 150.0000 mg | ORAL_TABLET | Freq: Once | ORAL | 0 refills | Status: AC

## 2021-01-20 MED ORDER — MORPHINE SULFATE ER 30 MG PO TBCR: 30.0000 mg | EXTENDED_RELEASE_TABLET | Freq: Two times a day (BID) | ORAL | 0 refills | Status: DC

## 2021-01-20 MED ORDER — TRESIBA FLEXTOUCH 200 UNIT/ML ~~LOC~~ SOPN: 52.0000 [IU] | PEN_INJECTOR | Freq: Every day | SUBCUTANEOUS | 3 refills | Status: DC

## 2021-01-20 MED ORDER — FREESTYLE LIBRE 2 SENSOR MISC: 3 refills | Status: DC

## 2021-01-20 MED ORDER — POTASSIUM CHLORIDE ER 10 MEQ PO CPCR: 20.0000 meq | ORAL_CAPSULE | Freq: Two times a day (BID) | ORAL | 0 refills | Status: DC

## 2021-01-20 NOTE — Assessment & Plan Note (Signed)
Continue nystatin

## 2021-01-20 NOTE — Assessment & Plan Note (Signed)
Change Lantus to [place] 52 units daily based on formulary.  Patient may use of Lantus.  I cautioned the patient to monitor for improving sugars and swelling towards hypoglycemia.  Patient has significantly splurged on nondiet beverages and has given those up now.

## 2021-01-20 NOTE — Assessment & Plan Note (Signed)
Fluconazole 150 mg once daily x1.  Given second pill in case has not resolved.

## 2021-01-20 NOTE — Assessment & Plan Note (Signed)
Mild, intermittent.  Likely secondary to anxiety.  May need repeat echocardiogram but patient is scheduled to see oncology soon and they were planning on ordering this

## 2021-02-03 ENCOUNTER — Encounter: Payer: Self-pay | Admitting: Hematology and Oncology

## 2021-02-04 ENCOUNTER — Telehealth: Payer: Self-pay

## 2021-02-04 NOTE — Telephone Encounter (Signed)
Spoke with [facility] pharmacist and they had done another prior authorization and patient was approved per [place] pharmacist. Left patient message informing her.

## 2021-02-04 NOTE — Telephone Encounter (Signed)
Patient called [employer]. They do cover synjardy under tier 6 specialty drugs.   Pharmacy: [facility] 02/04/21 2:32 PM

## 2021-02-04 NOTE — Telephone Encounter (Signed)
Direct msg from [facility] received regarding patient's Synjardy. She stated the patient's PA was denied and wanted to know what to do. Contacted [facility] and spoke with both Hildred Alamin and Joffre who both said that it was denied on 01/26/21 but they were able to get it approved on 01/27/21 and delivered on 01/28/21 for $0

## 2021-02-06 ENCOUNTER — Other Ambulatory Visit: Payer: Self-pay | Admitting: Family Medicine

## 2021-02-06 DIAGNOSIS — E1121 Type 2 diabetes mellitus with diabetic nephropathy: Secondary | ICD-10-CM

## 2021-02-10 ENCOUNTER — Other Ambulatory Visit: Payer: Self-pay

## 2021-02-10 ENCOUNTER — Ambulatory Visit: Payer: HMO | Admitting: Sports Medicine

## 2021-02-10 ENCOUNTER — Encounter: Payer: Self-pay | Admitting: Sports Medicine

## 2021-02-10 DIAGNOSIS — M79675 Pain in left toe(s): Secondary | ICD-10-CM | POA: Diagnosis not present

## 2021-02-10 DIAGNOSIS — E119 Type 2 diabetes mellitus without complications: Secondary | ICD-10-CM

## 2021-02-10 DIAGNOSIS — M79674 Pain in right toe(s): Secondary | ICD-10-CM | POA: Diagnosis not present

## 2021-02-10 DIAGNOSIS — B351 Tinea unguium: Secondary | ICD-10-CM

## 2021-02-10 NOTE — Progress Notes (Signed)
Subjective: Linda [name] is a 51 y.o. female patient with history of diabetes who presents to office today complaining of long,mildly painful nails while ambulating in shoes unable to trim. FBS not recorded, states that she was in the hospital last month for elevated blood sugars, last visit to PCP Dr. Tobie Poet 1 month ago.  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 04/17/2020   Osteomyelitis ([place]) 04/17/2020   Diabetic ulcer of toe of left foot associated with type 2 diabetes mellitus, with fat layer exposed ([place]) 03/27/2020   Dehydration 03/16/2020   Chemotherapy-induced thrombocytopenia 02/06/2020   Thrombocytopenia ([place]) 01/27/2020   Malignant neoplasm of lower-inner quadrant of right breast of female, estrogen receptor negative ([place]) 10/01/2019   Chronic pain syndrome 07/08/2019   Memory loss 07/08/2019   Depression, major, recurrent, mild ([place]) 22/63/3354   Uncomplicated opioid dependence ([place]) 07/08/2019   Mixed hyperlipidemia 04/11/2019   Dyslipidemia associated with type 2 diabetes mellitus ([place]) 04/11/2019   Essential hypertension, benign 04/11/2019   Acquired hypothyroidism 04/11/2019   Vitamin D insufficiency 04/11/2019   Class 2 severe obesity due to excess calories with serious comorbidity and body mass index (BMI) of 35.0 to 35.9 in adult ([place]) 04/11/2019   Pre-ulcerative calluses 03/04/2019   Liver cirrhosis secondary to NASH (nonalcoholic steatohepatitis) ([place]) 09/13/2018    Class: Chronic   BRCA1 positive 06/14/2016   Current Outpatient Medications on File Prior to Visit  Medication Sig Dispense Refill   atorvastatin (LIPITOR) 10 MG tablet Take 1 tablet (10 mg total) by mouth daily. 90  tablet 0   Blood Glucose Monitoring Suppl (ONETOUCH VERIO REFLECT) w/Device KIT AS DIRECTED     buPROPion (WELLBUTRIN XL) 300 MG 24 hr tablet TAKE ONE TABLET BY MOUTH EVERY EVENING 90 tablet 1   busPIRone (BUSPAR) 5 MG tablet TAKE ONE TABLET BY MOUTH THREE TIMES DAILY 90 tablet 2   calcium citrate-vitamin D (CITRACAL+D) 315-200 MG-UNIT tablet Take 1 tablet by mouth 2 (two) times daily.     clotrimazole-betamethasone (LOTRISONE) cream Apply small amount to affected area twice daily 45 g 2   Continuous Blood Gluc Receiver (FREESTYLE LIBRE 2 READER) DEVI E11.69 Check blood sugar 4 times daily as directed 1 each 0   Continuous Blood Gluc Sensor (FREESTYLE LIBRE 2 SENSOR) MISC E11.69 Change sensor every 14 days as directed 6 each 3   cyclobenzaprine (FLEXERIL) 10 MG tablet TAKE ONE TABLET BY MOUTH EVERY 8 HOURS AS NEEDED FOR MUSCLE SPASM 30 tablet 3   dicyclomine (BENTYL) 20 MG tablet TAKE ONE TABLET BY MOUTH BEFORE MEALS AND AT BEDTIME AS NEEDED FOR STOMACH CRAMPING 180 tablet 1   Empagliflozin-metFORMIN HCl (SYNJARDY) 12.05-998 MG TABS Take 1 tablet by mouth 2 (two) times daily. 180 tablet 0   famotidine (PEPCID) 20 MG tablet TAKE ONE TABLET BY MOUTH TWICE DAILY 180 tablet 1   ferrous sulfate 325 (65 FE) MG tablet Take 325 mg by mouth every evening.     gabapentin (NEURONTIN) 300 MG capsule Take 2 capsules (600 mg total) by mouth 3 (three) times daily. 180 capsule 2   icosapent Ethyl (VASCEPA) 1 g capsule Take 2 capsules (2 g total) by mouth 2 (two) times daily. 360 capsule 1   insulin degludec (  TRESIBA FLEXTOUCH) 200 UNIT/ML FlexTouch Pen Inject 52 Units into the skin daily. 9 mL 3   insulin glargine (LANTUS) 100 UNIT/ML injection Inject 52 Units into the skin daily.     Levomilnacipran HCl ER (FETZIMA) 80 MG CP24 Take 1 capsule by mouth daily. 90 capsule 1   levothyroxine (SYNTHROID) 75 MCG tablet TAKE ONE TABLET BY MOUTH ONCE DAILY 90 tablet 1   loratadine (CLARITIN) 10 MG tablet Take 10 mg by  mouth daily. Taking 3-4 days each week due to bone pain associated with shot from cancer center.     losartan (COZAAR) 50 MG tablet TAKE ONE TABLET BY MOUTH EVERY EVENING 90 tablet 1   Magnesium 500 MG CAPS 500 mg.     morphine (MS CONTIN) 30 MG 12 hr tablet Take 1 tablet (30 mg total) by mouth every 12 (twelve) hours. 60 tablet 0   Multiple Vitamin (MULTIVITAMIN WITH MINERALS) TABS tablet Take 1 tablet by mouth daily.      ondansetron (ZOFRAN) 4 MG tablet Take 1 tablet (4 mg total) by mouth every 4 (four) hours as needed for nausea. 90 tablet 3   OneTouch Delica Lancets 13Y MISC 1 each by Does not apply route daily before breakfast. Check blood sugar twice daily. 100 each 3   pantoprazole (PROTONIX) 40 MG tablet Take 1 tablet (40 mg total) by mouth 2 (two) times daily. 180 tablet 1   Pegfilgrastim-jmdb (FULPHILA [place]) Inject into the skin.     potassium chloride (MICRO-K) 10 MEQ CR capsule Take 2 capsules (20 mEq total) by mouth 2 (two) times daily. 360 capsule 0   prochlorperazine (COMPAZINE) 10 MG tablet Take 1 tablet (10 mg total) by mouth every 6 (six) hours as needed for nausea or vomiting. 90 tablet 3   rOPINIRole (REQUIP) 0.25 MG tablet TAKE ONE TABLET BY MOUTH EVERYDAY AT BEDTIME 30 tablet 2   senna (SENOKOT) 8.6 MG tablet Take 1 tablet by mouth daily as needed for constipation.     SODIUM FLUORIDE 5000 SENSITIVE 1.1-5 % GEL Take by mouth 2 (two) times daily.     Specialty Vitamins Products (MAGNESIUM, AMINO ACID CHELATE,) 133 MG tablet Take 1 tablet by mouth at bedtime.     TRULICITY 3 QM/5.7QI SOPN inject 48m AS DIRECTED ONCE A WEEK 2 mL 2   Vitamin D, Ergocalciferol, (DRISDOL) 1.25 MG (50000 UNIT) CAPS capsule TAKE ONE CAPSULE BY MOUTH ONCE WEEKLY ON FRIDAY 12 capsule 1   VYVANSE 40 MG capsule TAKE ONE CAPSULE BY MOUTH EVERY MORNING 30 capsule 0   zolpidem (AMBIEN) 10 MG tablet TAKE ONE TABLET BY MOUTH EVERYDAY AT BEDTIME AS NEEDED 30 tablet 5   No current facility-administered  medications on file prior to visit.   Allergies  Allergen Reactions   Codeine Shortness Of Breath    Other reaction(s): SHOB   Celecoxib Other (See Comments)    Unknown reaction Other reaction(s): Unknown   Ezetimibe     Other reaction(s): Unknown   Ezetimibe-Simvastatin Other (See Comments)    Unknown reaction   Propranolol     Other reaction(s): Unknown   Propranolol Hcl Other (See Comments)    Unknown reaction   Simvastatin     Other reaction(s): Unknown    Recent Results (from the past 2160 hour(s))  CBC with Differential/Platelet     Status: Abnormal   Collection Time: 11/16/20 11:42 AM  Result Value Ref Range   WBC 6.2 3.4 - 10.8 x10E3/uL   RBC 4.58 3.77 -  5.28 x10E6/uL   Hemoglobin 12.7 11.1 - 15.9 g/dL   Hematocrit 40.4 34.0 - 46.6 %   MCV 88 79 - 97 fL   MCH 27.7 26.6 - 33.0 pg   MCHC 31.4 (L) 31.5 - 35.7 g/dL   RDW 14.9 11.7 - 15.4 %   Platelets 96 (LL) 150 - 450 x10E3/uL   Neutrophils 67 Not Estab. %   Lymphs 24 Not Estab. %   Monocytes 6 Not Estab. %   Eos 2 Not Estab. %   Basos 1 Not Estab. %   Neutrophils Absolute 4.2 1.4 - 7.0 x10E3/uL   Lymphocytes Absolute 1.5 0.7 - 3.1 x10E3/uL   Monocytes Absolute 0.4 0.1 - 0.9 x10E3/uL   EOS (ABSOLUTE) 0.1 0.0 - 0.4 x10E3/uL   Basophils Absolute 0.0 0.0 - 0.2 x10E3/uL   Immature Granulocytes 0 Not Estab. %   Immature Grans (Abs) 0.0 0.0 - 0.1 x10E3/uL   Hematology Comments: Note:     Comment: Verified by microscopic examination.  Comprehensive metabolic panel     Status: Abnormal   Collection Time: 11/16/20 11:42 AM  Result Value Ref Range   Glucose 236 (H) 70 - 99 mg/dL   BUN 10 6 - 24 mg/dL   Creatinine, Ser 1.03 (H) 0.57 - 1.00 mg/dL   eGFR 65 >59 mL/min/1.73   BUN/Creatinine Ratio 10 9 - 23   Sodium 140 134 - 144 mmol/L   Potassium 4.1 3.5 - 5.2 mmol/L   Chloride 99 96 - 106 mmol/L   CO2 25 20 - 29 mmol/L   Calcium 9.5 8.7 - 10.2 mg/dL   Total Protein 6.8 6.0 - 8.5 g/dL   Albumin 4.4 3.8 - 4.9 g/dL    Globulin, Total 2.4 1.5 - 4.5 g/dL   Albumin/Globulin Ratio 1.8 1.2 - 2.2   Bilirubin Total 0.5 0.0 - 1.2 mg/dL   Alkaline Phosphatase 109 44 - 121 IU/L   AST 18 0 - 40 IU/L   ALT 22 0 - 32 IU/L  Hemoglobin A1c     Status: Abnormal   Collection Time: 11/16/20 11:42 AM  Result Value Ref Range   Hgb A1c MFr Bld 8.2 (H) 4.8 - 5.6 %    Comment:          Prediabetes: 5.7 - 6.4          Diabetes: >6.4          Glycemic control for adults with diabetes: <7.0    Est. average glucose Bld gHb Est-mCnc 189 mg/dL  Lipid panel     Status: Abnormal   Collection Time: 11/16/20 11:42 AM  Result Value Ref Range   Cholesterol, Total 227 (H) 100 - 199 mg/dL   Triglycerides 284 (H) 0 - 149 mg/dL   HDL 46 >39 mg/dL   VLDL Cholesterol Cal 51 (H) 5 - 40 mg/dL   LDL Chol Calc (NIH) 130 (H) 0 - 99 mg/dL   Chol/HDL Ratio 4.9 (H) 0.0 - 4.4 ratio    Comment:                                   T. Chol/HDL Ratio                                             Men  Women  1/2 Avg.Risk  3.4    3.3                                   Avg.Risk  5.0    4.4                                2X Avg.Risk  9.6    7.1                                3X Avg.Risk 23.4   11.0   Vitamin B12     Status: None   Collection Time: 11/19/20  9:18 AM  Result Value Ref Range   Vitamin B-12 256 180 - 914 pg/mL    Comment: (NOTE) This assay is not validated for testing neonatal or myeloproliferative syndrome specimens for Vitamin B12 levels. Performed at [facility], [place] [address] [place], [place] [address]   Folate     Status: None   Collection Time: 11/19/20  9:18 AM  Result Value Ref Range   Folate 15.3 >5.9 ng/mL    Comment: Performed at [facility], [place] [address], [place],  [address]    Objective: General: Patient is awake, alert, and oriented x 3 and in no acute distress.  Integument: Skin is warm, dry and supple bilateral. Nails are tender,  long, thickened and dystrophic with subungual debris, consistent with onychomycosis, 1-5 bilateral. No signs of infection.  Minimal callus at heels, 1st toes and sub met 1 bilateral. Remaining integument unremarkable. Previous left second toe ulcer remains healed.  Vasculature:  Dorsalis Pedis pulse 1/4 bilateral. Posterior Tibial pulse  1/4 bilateral. Capillary fill time <3 sec 1-5 bilateral. Positive hair growth to the level of the digits.Temperature gradient within normal limits. No varicosities present bilateral. No edema present bilateral.   Neurology: The patient has diminished sensation measured with a 5.07/10g Semmes Weinstein Monofilament at all pedal sites bilateral. Unchanged from prior.   Musculoskeletal:Asymptomatic hammertoe pedal deformities noted bilateral. Muscular strength 5/5 in all lower extremity muscular groups bilateral without pain on range of motion . No tenderness with calf compression bilateral.  Assessment and Plan: Problem List Items Addressed This Visit   None Visit Diagnoses     Pain due to onychomycosis of toenails of both feet    -  Primary   Diabetes mellitus without complication ([place])           -Examined patient. -Discussed and educated patient on diabetic foot care, especially with  regards to the vascular, neurological and musculoskeletal systems.  -Stressed the importance of good glycemic control and the detriment of not  controlling glucose levels in relation to the foot. -Mechanically debrided all nails 1-5 bilateral using sterile nail nipper and filed with dremel without incident  -Continue with daily skin emollients as instrcuted -Answered all patient questions -Patient to return  in 2.5-3 months for at risk foot care -Patient advised to call the office if any problems or questions arise in the meantime.  Landis Martins, DPM

## 2021-02-11 ENCOUNTER — Encounter: Payer: Self-pay | Admitting: Hematology and Oncology

## 2021-02-16 ENCOUNTER — Other Ambulatory Visit: Payer: Self-pay | Admitting: Family Medicine

## 2021-02-16 DIAGNOSIS — F5081 Binge eating disorder: Secondary | ICD-10-CM

## 2021-02-17 ENCOUNTER — Telehealth: Payer: Self-pay

## 2021-02-17 NOTE — Chronic Care Management (AMB) (Signed)
Chronic Care Management Pharmacy Assistant   Name: Linda [name]  MRN: [id number] DOB: 30-Jun-1970   Reason for Encounter: Medication Coordination for [facility]    Recent office visits:  01/20/21 Linda Brome MD. Seen for Hyperglycemia. Started Fluconazole 150 mg once  and Insulin Degludec 52 units daily.  Recent consult visits:  02/10/21 (Podiatry) Linda [name] DPM. Seen for Nail Problem. No med changes.  Hospital visits:  None  Medications: Outpatient Encounter Medications as of 02/17/2021  Medication Sig Note   atorvastatin (LIPITOR) 10 MG tablet Take 1 tablet (10 mg total) by mouth daily.    Blood Glucose Monitoring Suppl (ONETOUCH VERIO REFLECT) w/Device KIT AS DIRECTED    buPROPion (WELLBUTRIN XL) 300 MG 24 hr tablet TAKE ONE TABLET BY MOUTH EVERY EVENING    busPIRone (BUSPAR) 5 MG tablet TAKE ONE TABLET BY MOUTH THREE TIMES DAILY    calcium citrate-vitamin D (CITRACAL+D) 315-200 MG-UNIT tablet Take 1 tablet by mouth 2 (two) times daily.    clotrimazole-betamethasone (LOTRISONE) cream Apply small amount to affected area twice daily    Continuous Blood Gluc Receiver (FREESTYLE LIBRE 2 READER) DEVI E11.69 Check blood sugar 4 times daily as directed    Continuous Blood Gluc Sensor (FREESTYLE LIBRE 2 SENSOR) MISC E11.69 Change sensor every 14 days as directed    cyclobenzaprine (FLEXERIL) 10 MG tablet TAKE ONE TABLET BY MOUTH EVERY 8 HOURS AS NEEDED FOR MUSCLE SPASM    dicyclomine (BENTYL) 20 MG tablet TAKE ONE TABLET BY MOUTH BEFORE MEALS AND AT BEDTIME AS NEEDED FOR STOMACH CRAMPING    Empagliflozin-metFORMIN HCl (SYNJARDY) 12.05-998 MG TABS Take 1 tablet by mouth 2 (two) times daily.    famotidine (PEPCID) 20 MG tablet TAKE ONE TABLET BY MOUTH TWICE DAILY    ferrous sulfate 325 (65 FE) MG tablet Take 325 mg by mouth every evening.    gabapentin (NEURONTIN) 300 MG capsule Take 2 capsules (600 mg total) by mouth 3 (three) times daily.    icosapent Ethyl (VASCEPA) 1 g  capsule Take 2 capsules (2 g total) by mouth 2 (two) times daily.    insulin degludec (TRESIBA FLEXTOUCH) 200 UNIT/ML FlexTouch Pen Inject 52 Units into the skin daily.    insulin glargine (LANTUS) 100 UNIT/ML injection Inject 52 Units into the skin daily.    Levomilnacipran HCl ER (FETZIMA) 80 MG CP24 Take 1 capsule by mouth daily.    levothyroxine (SYNTHROID) 75 MCG tablet TAKE ONE TABLET BY MOUTH ONCE DAILY    loratadine (CLARITIN) 10 MG tablet Take 10 mg by mouth daily. Taking 3-4 days each week due to bone pain associated with shot from cancer center.    losartan (COZAAR) 50 MG tablet TAKE ONE TABLET BY MOUTH EVERY EVENING    Magnesium 500 MG CAPS 500 mg.    morphine (MS CONTIN) 30 MG 12 hr tablet Take 1 tablet (30 mg total) by mouth every 12 (twelve) hours.    Multiple Vitamin (MULTIVITAMIN WITH MINERALS) TABS tablet Take 1 tablet by mouth daily.  06/08/2018: Pt plans on purchasing again when she can get out.   ondansetron (ZOFRAN) 4 MG tablet Take 1 tablet (4 mg total) by mouth every 4 (four) hours as needed for nausea.    OneTouch Delica Lancets 96E MISC 1 each by Does not apply route daily before breakfast. Check blood sugar twice daily.    pantoprazole (PROTONIX) 40 MG tablet Take 1 tablet (40 mg total) by mouth 2 (two) times daily.    Pegfilgrastim-jmdb (Linda [name]  Linda [name]) Inject into the skin.    potassium chloride (MICRO-K) 10 MEQ CR capsule Take 2 capsules (20 mEq total) by mouth 2 (two) times daily.    pravastatin (PRAVACHOL) 20 MG tablet Take 20 mg by mouth at bedtime.    prochlorperazine (COMPAZINE) 10 MG tablet Take 1 tablet (10 mg total) by mouth every 6 (six) hours as needed for nausea or vomiting.    rOPINIRole (REQUIP) 0.25 MG tablet TAKE ONE TABLET BY MOUTH EVERYDAY AT BEDTIME    senna (SENOKOT) 8.6 MG tablet Take 1 tablet by mouth daily as needed for constipation.    SODIUM FLUORIDE 5000 SENSITIVE 1.1-5 % GEL Take by mouth 2 (two) times daily.    Specialty Vitamins Products  (MAGNESIUM, AMINO ACID CHELATE,) 133 MG tablet Take 1 tablet by mouth at bedtime.    TRULICITY 3 VE/9.3YB SOPN inject 65m AS DIRECTED ONCE A WEEK    Vitamin D, Ergocalciferol, (DRISDOL) 1.25 MG (50000 UNIT) CAPS capsule TAKE ONE CAPSULE BY MOUTH ONCE WEEKLY ON FRIDAY    VYVANSE 40 MG capsule TAKE ONE CAPSULE BY MOUTH EVERY MORNING    zolpidem (AMBIEN) 10 MG tablet TAKE ONE TABLET BY MOUTH EVERYDAY AT BEDTIME AS NEEDED    No facility-administered encounter medications on file as of 02/17/2021.   Reviewed chart for medication changes ahead of medication coordination call.  No hospital visits since last care coordination call/Pharmacist visit.   No medication changes indicated OR if recent visit, treatment plan here.  BP Readings from Last 3 Encounters:  01/20/21 126/78  11/19/20 (!) 146/72  11/16/20 118/68    Lab Results  Component Value Date   HGBA1C 8.2 (H) 11/16/2020     Patient obtains medications through Vials  30 Days   Last adherence delivery included:  Buspirone 5 mg- 1 tab 3 times daily  Morphine ER 30 mg-  1 capsule every 12 hours Clotrim-beta cream twice daily Ropinirole -0.25 mg- 1 tablet daily (bedtime) Zolpidem- 10 mg- 1 tablet daily (bedtime) Potassium CL ER 10 meq- 2 tablets twice daily Losartan Potassium 50 mg- 1 tablet every evening  Famotidine 20 mg- 1 tablet twice daily Bupropion XL 300 mg -1 tablet daily (bedtime) Protonix 40 mg- 1 tablet before breakfast and before evening meal Levothyroxine 75 mcg- 1 tablet daily Fetzima. ER 80 mg- 1 capsule daily Gabapentin 300 mg -2 capsule 3 times daily Zofran 423mTake 1 tablet (4 mg total) by mouth every 4 (four) hours as needed     Pravastatin 20 mg- 1 tablet daily (bedtime) Synjardy- 12.5-1,000 mg - 1 tablet twice daily Icosapent ethyl 10 mg- daily Atorvastatin 10 mg 1 tab once daily  Freestyle kit 2 Sensor- Use as directed and change every 14 days.  Vitamin D2 1250 mcg- once every Friday.  Vyvanse and  Trulicity are due on 1201/75nd pharmacy will schedule to deliver these.  Patient declined (meds) last month  None  Patient is due for next adherence delivery on: 03/01/21. Called patient and reviewed medications and coordinated delivery.  This delivery to include: Buspirone 5 mg- 1 tab 3 times daily  Morphine ER 30 mg-  1 capsule every 12 hours Clotrim-beta cream twice daily Ropinirole -0.25 mg- 1 tablet daily (bedtime) Zolpidem- 10 mg- 1 tablet daily (bedtime) Potassium CL ER 10 meq- 2 tablets twice daily Losartan Potassium 50 mg- 1 tablet every evening  Famotidine 20 mg- 1 tablet twice daily Bupropion XL 300 mg -1 tablet daily (bedtime) Protonix 40 mg- 1 tablet before breakfast  and before evening meal Levothyroxine 75 mcg- 1 tablet daily Fetzima. ER 80 mg- 1 capsule daily Gabapentin 300 mg -2 capsule 3 times daily Zofran 13m Take 1 tablet (4 mg total) by mouth every 4 (four) hours as needed     Pravastatin 20 mg- 1 tablet daily (bedtime) Synjardy- 12.5-1,000 mg - 1 tablet twice daily Vascepa 1gm- 2 capsules twice daily  Atorvastatin 10 mg 1 tab once daily  Freestyle kit 2 Sensor- Use as directed and change every 14 days.  Vitamin D2 1250 mcg- once every Friday. Cyclobenzaprine 10 mg- 1 tab every 8 hours prn  Tresiba Flextouch 200u- Inject 52 units daily   Note from pharmacy: trulicity 330mhas been on back order, it will be due on 1/27. we will not be able to send that for her, please advise. I will plan to send the vyvanse on the 26th as long as we get the refill.   I have been unable to reach pt as I have made several attempts. I have messaged the provider about sending her Trulicity to another pharmacy.   Patient declined the following medications (meds) due to (reason)  Patient needs refills -Refill request sent to clinical pool  Morphine ER 30 mg Gabapentin 300 mg Synjardy- 12.5-1,000 mg Atorvastatin 10 mg  Confirmed delivery date of 03/01/21, advised patient that  pharmacy will contact them the morning of delivery.   DaElray McgregorCMUticaharmacist Assistant  [phone]

## 2021-02-18 ENCOUNTER — Ambulatory Visit: Payer: PPO | Admitting: Family Medicine

## 2021-02-18 NOTE — Assessment & Plan Note (Addendum)
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  Her last platelet count was 96,000 in October, so in good range.  She continues to have regular blood work at Dr. [employer] office and will have a CBC again next month.

## 2021-02-18 NOTE — Assessment & Plan Note (Addendum)
Positive BRCA 1 mutation, which increases her risk for breast and ovarian cancer, as well as elevates her risk for pancreatic cancer.  She underwent hysterectomy/bilateral salpingo-oophorectomy.  The hepatologist recommended biannual screening for hepatocellular carcinoma with ultrasound alternating with cross-sectional imaging to allow screening for pancreatic cancer.  The patient has not had any imaging since May 2022.

## 2021-02-18 NOTE — Assessment & Plan Note (Addendum)
Liver cirrhosis as seen on CT imaging in August 2020. She was referred to [facility], [place], at the [facility] in [place]. Hepatitis panel was negative. She received the appropriate vaccines.  Most recent imaging from May 2022 was stable.  [name] recommended every 6 month screening for hepatocellular cancer with ultrasound alternating with cross-sectional imaging to allow for screening for pancreatic cancer.  The patient last had liver imaging with an ultrasound in May 2022, which was negative except for fatty infiltration of the liver.  The patient states she is not following with [name] any longer.  I will arrange for the patient to have an ultrasound of the liver to screen for hepatocellular carcinoma at this time and then consider cross-sectional imaging in 6 months.

## 2021-02-18 NOTE — Progress Notes (Signed)
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  She was treated with bilateral mastectomy due to BRCA1 mutation.  She completed 4 cycles of Adriamycin/cyclophosphamide chemotherapy and received 8 out of 12 planned weeks of weekly paclitaxel.  She remains without evidence of recurrence.  BRCA1 positive Positive BRCA 1 mutation, which increases her risk for breast and ovarian cancer, as well as elevates her risk for pancreatic cancer.  She underwent hysterectomy/bilateral salpingo-oophorectomy.  Linda hepatologist recommended biannual screening for hepatocellular carcinoma with ultrasound alternating with cross-sectional imaging to allow screening for pancreatic cancer.  Linda patient has not had any imaging since May 2022.  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  Her last platelet count was 96,000 in October, so in good range.  She continues to have regular blood work at [name] and will have a CBC again next month.  Liver cirrhosis secondary to NASH (nonalcoholic steatohepatitis) ([place]) Liver cirrhosis as seen on CT imaging in August 2020.  She was [facility] to [name].  Hepatitis panel was negative. She received Linda appropriate vaccines.  Most recent imaging from May 2022 was stable.  [name] recommended every 6 month screening for hepatocellular cancer with ultrasound alternating with cross-sectional imaging to allow for screening for pancreatic cancer.  Linda patient last  had liver imaging with an ultrasound in May 2022, which was negative except for fatty infiltration of Linda liver.  Linda patient states she is not following with [name] any longer.  I will arrange for Linda patient to have an ultrasound of Linda liver to screen for hepatocellular carcinoma at this time and then consider cross-sectional imaging in 6 months.   I will see her back in 4 months with a CBC and comprehensive panel for repeat clinical assessment.  Linda patient understands Linda plans discussed today and is in agreement with them.  She knows to contact our office if she develops concerns prior to her next appointment.      Linda Pickles, PA-C  [name] [address] [place] [address] Dept: [phone] Dept Fax: [phone]   Orders Placed This Encounter  Procedures   US Abdomen Limited    Standing Status:   Future    Standing Expiration Date:   02/19/2022    Order Specific Question:   Reason for Exam (SYMPTOM  OR DIAGNOSIS REQUIRED)    Answer:   screen for hepatocellular cancer in patient with cirrhosis    Comments:   RH    Order Specific Question:   Preferred imaging location?    Answer:   External      CHIEF COMPLAINT:  CC: Stage IIB triple negative breast cancer  Current Treatment: Observation  HISTORY OF PRESENT ILLNESS:  Linda [name] is a 51 y.o. female who we began seeing in February 2018 for evaluation of anemia.  Her anemia was felt to be secondary to iron deficiency and we recommended continuation of oral iron supplementation for a total of 6  months, as well as referral back to Linda gastroenterologist.  Linda patient also has a history of thrombocytopenia and had previously seen [name] in 2014.  Linda thrombocytopenia was felt to be secondary to mild chronic immune thrombocytopenic purpura (ITP).  Due to Linda patient's family history of malignancy, she underwent testing for hereditary cancer  syndromes with [facility] myRisk Hereditary Cancer Panel test.  This revealed a mutation in Linda BRCA 1 gene, which is associated with a significantly increased risk for breast and ovarian cancer, and an elevated risk of pancreatic cancer.  She underwent a robotic hysterectomy and bilateral salpingo-oophorectomy in May 2018 with Dr. Everitt [name].  Pathology was benign.  We also discussed Linda option of risk reducing bilateral mastectomy, but she has chosen to have close surveillance, so we recommended annual MRI breast, in addition to mammography.  She was placed on chemoprevention with raloxifene in September 2018.  CT abdomen and pelvis in August 2020 done for bilateral flank pain, revealed a tiny left renal calculus, as well as probable hepatic cirrhosis without evidence of hepatic mass.  She was then referred to [place] and is followed by [name] in [place] for her liver cirrhosis.  She tested negative for hepatitis A, B, and C.  She received her hepatitis vaccines in 2020.   We saw her in September 2021 for a new diagnosis of stage IB (T1c N0 M0) triple negative right breast cancer.  She underwent screening bilateral mammogram on August 3rd which revealed possible masses in Linda right breast.  Diagnostic right mammogram and right breast ultrasound from August 18th confirmed suspicious masses in Linda right breast at 5 o'clock measuring 1.5 cm and 6 o'clock measuring 1.4 cm.  There was an indeterminate 4 mm with possible distortion in Linda outer right breast without sonographic correlate.  Right axillary ultrasound was normal.  She then underwent biopsies of both masses and surgical pathology from these procedures revealed  invasive ductal carcinoma, grade 3, with necrosis at 5 o'clock; and invasive ductal carcinoma, grade 1-2, with necrosis and focal myxoid change at 6 o'clock.  No DCIS was identified.  Breast prognostic profiles revealed HER2, and estrogen and progesterone  receptors to be negative. Ki67 was 30% at Linda 5 o'clock mass, and 40% at 6 o'clock.  She underwent bilateral mastectomies on September 24th with Dr. Noberto Retort. Surgical pathology from this procedure revealed invasive ductal carcinoma, grade 3, with myxoid change, 19 mm, at 6 o'clock, and invasive ductal carcinoma, grade 3, and ductal carcinoma in situ, 19 mm, at 5 o'clock.  All margins were negative for invasive carcinoma or DCIS.  Two sentinel lymph nodes were negative for metastatic carcinoma (0/2). CT chest, abdomen and pelvis in September did not reveal any evidence of metastatic disease.  She started dose dense AC chemotherapy on October 25th , with plans to follow this with weekly paclitaxel for 12 doses.  She had severe restless leg syndrome with oral corticosteroids.  She had significantly worsened anemia with a hemoglobin of 8.7, as well as pancytopenia, so Linda 4th cycle of chemotherapy was delayed, but she finally got her last dose on January 4th.  Iron studies, B12 and folate were normal. Her anemia has slowly improved.  She started weekly paclitaxel on January 26th and has received 8 out of 12 planned doses.   She did have some expected pancytopenia.  She has pre-existing neuropathy of Linda feet and legs.     She was admitted to [name] for osteomyelitis of Linda left 2nd toe in March 2022.  She was treated with empiric vancomycin and Zosyn.  Amputation was recommended but Linda patient refused.  Cultures returned with Klebsiella, sensitive to IV Rocephin, so long term antibiotics 2 g Q24H will be continued for Linda next 6 weeks. At that time, we decided to discontinue weekly paclitaxel as she had received 8/12 cycles and had significant neuropathy.  She has done fairly well since that time.  She has routine laboratories every 3 months at Dr. Alyse Low office.  INTERVAL HISTORY:  Keiri is here today for repeat clinical assessment and states she has been doing well.  She denies any changes in her mastectomy  sites.  She states she is no longer following with [name] hepat[facility].  She has not had liver imaging since May 2020.  She reports occasional nausea and diarrhea.  She denies abdominal pain. She denies melena or hematochezia.  She reports easy bruising, but denies excessive bleeding.   She denies fevers or chills. She reports chronic, unchanged low back pain. Her appetite is good. Her weight has increased 3 pounds over last 3 months .  REVIEW OF SYSTEMS:  Review of Systems  Constitutional:  Positive for fatigue (Mild). Negative for appetite change, chills, fever and unexpected weight change.  HENT:   Negative for lump/mass, mouth sores and sore throat.   Respiratory:  Negative for cough and shortness of breath.   Cardiovascular:  Negative for chest pain and leg swelling.  Gastrointestinal:  Positive for diarrhea (Occasional) and nausea (Occasional). Negative for abdominal pain, blood in stool, constipation and vomiting.  Endocrine: Negative for hot flashes.  Genitourinary:  Negative for difficulty urinating, dysuria, frequency and hematuria.   Musculoskeletal:  Positive for back pain (Chronic, stable). Negative for arthralgias and myalgias.  Skin:  Negative for rash.  Neurological:  Negative for dizziness and headaches.  Hematological:  Negative for adenopathy. Does not bruise/bleed easily.  Psychiatric/Behavioral:  Negative for depression and sleep disturbance. Linda patient is nervous/anxious (Controlled with medication).     VITALS:  Blood pressure (!) 107/55, pulse 100, temperature 98.6 F (37 C), temperature source Oral, resp. rate 18, height 5' 2"  (1.575 m), weight 196 lb 6.4 oz (89.1 kg), last menstrual period 06/13/2009, SpO2 98 %.  Wt Readings from Last 3 Encounters:  02/19/21 196 lb 6.4 oz (89.1 kg)  01/20/21 193 lb (87.5 kg)  11/19/20 202 lb 1.6 oz (91.7 kg)    Body mass index is 35.92 kg/m.  Performance status (ECOG): 1 - Symptomatic but completely ambulatory  PHYSICAL EXAM:   Physical Exam Vitals and nursing note reviewed.  Constitutional:      General: She is not in acute distress.    Appearance: Normal appearance.  HENT:     Head: Normocephalic and atraumatic.     Mouth/Throat:     Mouth: Mucous membranes are moist.     Pharynx: Oropharynx is clear. No oropharyngeal exudate or posterior oropharyngeal erythema.  Eyes:     General: No scleral icterus.    Extraocular Movements: Extraocular movements intact.     Conjunctiva/sclera: Conjunctivae normal.     Pupils: Pupils are equal, round, and reactive to light.  Cardiovascular:     Rate and Rhythm: Normal rate and regular rhythm.     Heart sounds: Normal heart sounds. No murmur heard.   No friction rub. No gallop.  Pulmonary:     Effort: Pulmonary effort is normal.     Breath sounds: Normal breath sounds.  No wheezing, rhonchi or rales.  Abdominal:     General: There is no distension.     Palpations: Abdomen is soft. There is hepatomegaly and splenomegaly. There is no mass.     Tenderness: There is no abdominal tenderness.  Musculoskeletal:        General: Normal range of motion.     Cervical back: Normal range of motion and neck supple. No tenderness.     Right lower leg: No edema.     Left lower leg: No edema.  Lymphadenopathy:     Cervical: No cervical adenopathy.     Upper Body:     Right upper body: No supraclavicular or axillary adenopathy.     Left upper body: No supraclavicular or axillary adenopathy.     Lower Body: No right inguinal adenopathy. No left inguinal adenopathy.  Skin:    General: Skin is warm and dry.     Coloration: Skin is not jaundiced.     Findings: No rash.  Neurological:     Mental Status: She is alert and oriented to person, place, and time.     Cranial Nerves: No cranial nerve deficit.  Psychiatric:        Mood and Affect: Mood normal.        Behavior: Behavior normal.        Thought Content: Thought content normal.    LABS:   CBC Latest Ref Rng & Units  11/16/2020 08/19/2020 07/24/2020  WBC 3.4 - 10.8 x10E3/uL 6.2 5.4 5.9  Hemoglobin 11.1 - 15.9 g/dL 12.7 11.6(A) 12.2  Hematocrit 34.0 - 46.6 % 40.4 36 37.6  Platelets 150 - 450 x10E3/uL 96(LL) 79(A) 89(LL)   CMP Latest Ref Rng & Units 11/16/2020 08/19/2020 07/24/2020  Glucose 70 - 99 mg/dL 236(H) - 114(H)  BUN 6 - 24 mg/dL 10 18 15   Creatinine 0.57 - 1.00 mg/dL 1.03(H) 1.0 0.99  Sodium 134 - 144 mmol/L 140 139 140  Potassium 3.5 - 5.2 mmol/L 4.1 4.1 4.0  Chloride 96 - 106 mmol/L 99 99 98  CO2 20 - 29 mmol/L 25 32(A) 26  Calcium 8.7 - 10.2 mg/dL 9.5 9.6 9.4  Total Protein 6.0 - 8.5 g/dL 6.8 - 6.2  Total Bilirubin 0.0 - 1.2 mg/dL 0.5 - 0.5  Alkaline Phos 44 - 121 IU/L 109 71 76  AST 0 - 40 IU/L 18 21 16   ALT 0 - 32 IU/L 22 16 20      No results found for: CEA1 / No results found for: CEA1 No results found for: PSA1 No results found for: KNL976 No results found for: CAN125  No results found for: TOTALPROTELP, ALBUMINELP, A1GS, A2GS, BETS, BETA2SER, GAMS, MSPIKE, SPEI Lab Results  Component Value Date   TIBC 312 05/20/2020   TIBC 300 01/13/2020   FERRITIN 98 05/20/2020   IRONPCTSAT 26 05/20/2020   IRONPCTSAT 24.3 01/13/2020   No results found for: LDH  STUDIES:  No results found.    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 No longer has    Past Surgical History:  Procedure Laterality Date   APPENDECTOMY     BACK SURGERY     x 3 lower disc   BILATERAL TOTAL MASTECTOMY WITH AXILLARY LYMPH NODE DISSECTION Bilateral 09/2019   CHOLECYSTECTOMY     nephrolithiasis     ROBOTIC ASSISTED TOTAL HYSTERECTOMY WITH BILATERAL SALPINGO OOPHERECTOMY Bilateral 06/14/2016   Procedure: ROBOTIC ASSISTED TOTAL HYSTERECTOMY WITH BILATERAL SALPINGO OOPHORECTOMY;  Surgeon: Nancy Marus, MD;  Location: [facility];  Service: Gynecology;  Laterality: Bilateral;    Family History  Problem Relation Age of Onset   Cancer Mother        breast   CAD Father    Diabetes Father    Heart failure Father    Cancer Father        renal carcinoma   Kidney failure Father    Stroke Paternal Grandmother     Social History:  reports that she has never smoked. She has never used smokeless tobacco. She reports that she does not drink alcohol and does not use drugs.Linda patient is alone today.  Allergies:  Allergies  Allergen Reactions   Codeine Shortness Of Breath    Other reaction(s): SHOB   Celecoxib Other (See Comments)    Unknown reaction Other reaction(s): Unknown   Ezetimibe     Other reaction(s): Unknown   Ezetimibe-Simvastatin Other (See Comments)    Unknown reaction   Propranolol     Other reaction(s): Unknown   Propranolol Hcl Other (See Comments)    Unknown reaction   Simvastatin     Other reaction(s): Unknown    Current Medications: Current Outpatient Medications  Medication Sig Dispense Refill   atorvastatin (LIPITOR) 10 MG tablet Take 1 tablet (10 mg total) by mouth daily. 90 tablet 0   Blood Glucose Monitoring Suppl (ONETOUCH VERIO REFLECT) w/Device KIT AS DIRECTED     buPROPion  (WELLBUTRIN XL) 300 MG 24 hr tablet TAKE ONE TABLET BY MOUTH EVERY EVENING 90 tablet 1   busPIRone (BUSPAR) 5 MG tablet TAKE ONE TABLET BY MOUTH THREE TIMES DAILY 90 tablet 2   calcium citrate-vitamin D (CITRACAL+D) 315-200 MG-UNIT tablet Take 1 tablet by mouth 2 (two) times daily.     clotrimazole-betamethasone (LOTRISONE) cream Apply small amount to affected area twice daily 45 g 2   Continuous Blood Gluc Receiver (FREESTYLE LIBRE 2 READER) DEVI E11.69 Check blood sugar 4 times daily as directed 1 each 0   Continuous Blood Gluc Sensor (FREESTYLE LIBRE 2 SENSOR) MISC E11.69 Change sensor every 14 days as directed 6 each 3   cyclobenzaprine (FLEXERIL) 10 MG tablet TAKE ONE TABLET BY MOUTH EVERY 8 HOURS AS NEEDED FOR MUSCLE SPASM 30 tablet 3   dicyclomine (BENTYL) 20 MG tablet TAKE ONE TABLET BY MOUTH BEFORE MEALS AND AT BEDTIME AS NEEDED FOR STOMACH CRAMPING 180 tablet 1   Empagliflozin-metFORMIN HCl (SYNJARDY) 12.05-998 MG TABS Take 1 tablet by mouth  2 (two) times daily. 180 tablet 0   famotidine (PEPCID) 20 MG tablet TAKE ONE TABLET BY MOUTH TWICE DAILY 180 tablet 1   ferrous sulfate 325 (65 FE) MG tablet Take 325 mg by mouth every evening.     gabapentin (NEURONTIN) 300 MG capsule Take 2 capsules (600 mg total) by mouth 3 (three) times daily. 180 capsule 2   icosapent Ethyl (VASCEPA) 1 g capsule Take 2 capsules (2 g total) by mouth 2 (two) times daily. 360 capsule 1   insulin degludec (TRESIBA FLEXTOUCH) 200 UNIT/ML FlexTouch Pen Inject 52 Units into Linda skin daily. 9 mL 3   Levomilnacipran HCl ER (FETZIMA) 80 MG CP24 Take 1 capsule by mouth daily. 90 capsule 1   levothyroxine (SYNTHROID) 75 MCG tablet TAKE ONE TABLET BY MOUTH ONCE DAILY 90 tablet 1   loratadine (CLARITIN) 10 MG tablet Take 10 mg by mouth daily. Taking 3-4 days each week due to bone pain associated with shot from [name].     losartan (COZAAR) 50 MG tablet TAKE ONE TABLET BY MOUTH EVERY EVENING 90 tablet 1   Magnesium 500  MG CAPS 500 mg.     morphine (MS CONTIN) 30 MG 12 hr tablet Take 1 tablet (30 mg total) by mouth every 12 (twelve) hours. 60 tablet 0   Multiple Vitamin (MULTIVITAMIN WITH MINERALS) TABS tablet Take 1 tablet by mouth daily.      ondansetron (ZOFRAN) 4 MG tablet Take 1 tablet (4 mg total) by mouth every 4 (four) hours as needed for nausea. 90 tablet 3   OneTouch Delica Lancets 95F MISC 1 each by Does not apply route daily before breakfast. Check blood sugar twice daily. 100 each 3   pantoprazole (PROTONIX) 40 MG tablet Take 1 tablet (40 mg total) by mouth 2 (two) times daily. 180 tablet 1   potassium chloride (MICRO-K) 10 MEQ CR capsule Take 2 capsules (20 mEq total) by mouth 2 (two) times daily. 360 capsule 0   pravastatin (PRAVACHOL) 20 MG tablet Take 20 mg by mouth at bedtime.     prochlorperazine (COMPAZINE) 10 MG tablet Take 1 tablet (10 mg total) by mouth every 6 (six) hours as needed for nausea or vomiting. 90 tablet 3   rOPINIRole (REQUIP) 0.25 MG tablet TAKE ONE TABLET BY MOUTH EVERYDAY AT BEDTIME 30 tablet 2   senna (SENOKOT) 8.6 MG tablet Take 1 tablet by mouth daily as needed for constipation.     SODIUM FLUORIDE 5000 SENSITIVE 1.1-5 % GEL Take by mouth 2 (two) times daily.     Specialty Vitamins Products (MAGNESIUM, AMINO ACID CHELATE,) 133 MG tablet Take 1 tablet by mouth at bedtime.     TRULICITY 3 MB/3.4YZ SOPN inject 36m AS DIRECTED ONCE A WEEK 2 mL 2   Vitamin D, Ergocalciferol, (DRISDOL) 1.25 MG (50000 UNIT) CAPS capsule TAKE ONE CAPSULE BY MOUTH ONCE WEEKLY ON FRIDAY 12 capsule 1   VYVANSE 40 MG capsule TAKE ONE CAPSULE BY MOUTH EVERY MORNING 30 capsule 0   zolpidem (AMBIEN) 10 MG tablet TAKE ONE TABLET BY MOUTH EVERYDAY AT BEDTIME AS NEEDED 30 tablet 5   No current facility-administered medications for this visit.

## 2021-02-18 NOTE — Assessment & Plan Note (Signed)
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 She was treated with bilateral mastectomy due to BRCA1 mutation. She completed 4 cycles of Adriamycin/cyclophosphamide chemotherapy and received 8 out of 12 planned weeks of weekly paclitaxel.  She remains without evidence of recurrence.

## 2021-02-19 ENCOUNTER — Inpatient Hospital Stay: Payer: HMO | Attending: Hematology and Oncology | Admitting: Hematology and Oncology

## 2021-02-19 ENCOUNTER — Telehealth: Payer: Self-pay | Admitting: Hematology and Oncology

## 2021-02-19 ENCOUNTER — Encounter: Payer: Self-pay | Admitting: Hematology and Oncology

## 2021-02-19 ENCOUNTER — Other Ambulatory Visit: Payer: Self-pay

## 2021-02-19 VITALS — BP 107/55 | HR 100 | Temp 98.6°F | Resp 18 | Ht 62.0 in | Wt 196.4 lb

## 2021-02-19 DIAGNOSIS — K7460 Unspecified cirrhosis of liver: Secondary | ICD-10-CM

## 2021-02-19 DIAGNOSIS — Z1501 Genetic susceptibility to malignant neoplasm of breast: Secondary | ICD-10-CM

## 2021-02-19 DIAGNOSIS — D696 Thrombocytopenia, unspecified: Secondary | ICD-10-CM

## 2021-02-19 DIAGNOSIS — Z1509 Genetic susceptibility to other malignant neoplasm: Secondary | ICD-10-CM | POA: Insufficient documentation

## 2021-02-19 DIAGNOSIS — Z171 Estrogen receptor negative status [ER-]: Secondary | ICD-10-CM | POA: Diagnosis not present

## 2021-02-19 DIAGNOSIS — C50311 Malignant neoplasm of lower-inner quadrant of right female breast: Secondary | ICD-10-CM | POA: Insufficient documentation

## 2021-02-19 DIAGNOSIS — K7581 Nonalcoholic steatohepatitis (NASH): Secondary | ICD-10-CM

## 2021-02-19 MED ORDER — HEPARIN SOD (PORK) LOCK FLUSH 100 UNIT/ML IV SOLN
500.0000 [IU] | Freq: Once | INTRAVENOUS | Status: AC | PRN
  Administered 2021-02-19: 500 [IU]

## 2021-02-19 MED ORDER — SODIUM CHLORIDE 0.9% FLUSH
10.0000 mL | INTRAVENOUS | Status: AC | PRN
  Administered 2021-02-19: 10 mL

## 2021-02-19 NOTE — Telephone Encounter (Signed)
Notified pt of upcoming Abdominal U/S appt for 02/24/21 @ 9:30; check in at 9. Pt aware: NPO after midnight the night before exam.

## 2021-02-19 NOTE — Telephone Encounter (Signed)
Pt has been scheduled for Port Flush in 2 months and a port flush / fu in 44m She has been given an appt schedule.  [place], pt aware to expect a call with the appt information.

## 2021-02-19 NOTE — Addendum Note (Signed)
Addended by: Daryel November on: 02/19/2021 03:08 PM   Modules accepted: Orders

## 2021-02-23 ENCOUNTER — Other Ambulatory Visit: Payer: Self-pay | Admitting: Family Medicine

## 2021-02-23 DIAGNOSIS — Z1509 Genetic susceptibility to other malignant neoplasm: Secondary | ICD-10-CM

## 2021-02-23 DIAGNOSIS — Z1501 Genetic susceptibility to malignant neoplasm of breast: Secondary | ICD-10-CM

## 2021-02-23 NOTE — Telephone Encounter (Signed)
Unable to get in touch. Called mother who called daughter who called me back. Affirmed delivery of the 6th. Coordinated with Andee Poles to get meds ready for that date

## 2021-02-25 ENCOUNTER — Telehealth: Payer: Self-pay

## 2021-02-25 NOTE — Chronic Care Management (AMB) (Signed)
Chronic Care Management Pharmacy Assistant   Name: Linda [name]  MRN: [id number] DOB: 1970-08-30  Reason for Encounter: Prior Authroization   02/25/2021- Prior authorization completed through Cover[employer]Meds for Vasepa, awaiting determination.   03/03/2021- PA for Vascepa denied:Plan requirements for the approval of a non-formulary medication are: Diagnosis of a medically-accepted indication (approved medical condition); AND The patient has already tried one formulary alternative. Received message from [place] that patient needs prior authorization for Vyvanse 40 mg. Prior authorization started for Vyvanse through [employer]. Awaiting determination.  Vyvanse was approved PA Case ID: [id number], 08-FEB-23:[id number]-23 Vyvanse 40MG OR CAPS Quantity:30. Patient notified and [facility] notified.  Medications: Outpatient Encounter Medications as of 02/25/2021  Medication Sig Note   atorvastatin (LIPITOR) 10 MG tablet TAKE ONE TABLET BY MOUTH ONCE DAILY    Blood Glucose Monitoring Suppl (ONETOUCH VERIO REFLECT) w/Device KIT AS DIRECTED    buPROPion (WELLBUTRIN XL) 300 MG 24 hr tablet TAKE ONE TABLET BY MOUTH EVERY EVENING    busPIRone (BUSPAR) 5 MG tablet TAKE ONE TABLET BY MOUTH THREE TIMES DAILY    calcium citrate-vitamin D (CITRACAL+D) 315-200 MG-UNIT tablet Take 1 tablet by mouth 2 (two) times daily.    clotrimazole-betamethasone (LOTRISONE) cream Apply small amount to affected area twice daily    Continuous Blood Gluc Receiver (FREESTYLE LIBRE 2 READER) DEVI E11.69 Check blood sugar 4 times daily as directed    Continuous Blood Gluc Sensor (FREESTYLE LIBRE 2 SENSOR) MISC E11.69 Change sensor every 14 days as directed    cyclobenzaprine (FLEXERIL) 10 MG tablet TAKE ONE TABLET BY MOUTH EVERY 8 HOURS AS NEEDED FOR MUSCLE SPASM    dicyclomine (BENTYL) 20 MG tablet TAKE ONE TABLET BY MOUTH BEFORE MEALS AND AT BEDTIME AS NEEDED FOR STOMACH CRAMPING    famotidine (PEPCID) 20 MG tablet TAKE ONE  TABLET BY MOUTH TWICE DAILY    ferrous sulfate 325 (65 FE) MG tablet Take 325 mg by mouth every evening.    gabapentin (NEURONTIN) 300 MG capsule TAKE TWO CAPSULES BY MOUTH THREE TIMES DAILY    icosapent Ethyl (VASCEPA) 1 g capsule Take 2 capsules (2 g total) by mouth 2 (two) times daily.    insulin degludec (TRESIBA FLEXTOUCH) 200 UNIT/ML FlexTouch Pen Inject 52 Units into the skin daily.    Levomilnacipran HCl ER (FETZIMA) 80 MG CP24 Take 1 capsule by mouth daily.    levothyroxine (SYNTHROID) 75 MCG tablet TAKE ONE TABLET BY MOUTH ONCE DAILY    loratadine (CLARITIN) 10 MG tablet Take 10 mg by mouth daily. Taking 3-4 days each week due to bone pain associated with shot from cancer center.    losartan (COZAAR) 50 MG tablet TAKE ONE TABLET BY MOUTH EVERY EVENING    Magnesium 500 MG CAPS 500 mg.    morphine (MS CONTIN) 30 MG 12 hr tablet TAKE ONE TABLET BY MOUTH EVERY TWELVE HOURS    Multiple Vitamin (MULTIVITAMIN WITH MINERALS) TABS tablet Take 1 tablet by mouth daily.  06/08/2018: Pt plans on purchasing again when she can get out.   ondansetron (ZOFRAN) 4 MG tablet Take 1 tablet (4 mg total) by mouth every 4 (four) hours as needed for nausea.    OneTouch Delica Lancets 84Z MISC 1 each by Does not apply route daily before breakfast. Check blood sugar twice daily.    pantoprazole (PROTONIX) 40 MG tablet Take 1 tablet (40 mg total) by mouth 2 (two) times daily.    potassium chloride (MICRO-K) 10 MEQ CR capsule Take 2  capsules (20 mEq total) by mouth 2 (two) times daily.    pravastatin (PRAVACHOL) 20 MG tablet Take 20 mg by mouth at bedtime.    prochlorperazine (COMPAZINE) 10 MG tablet Take 1 tablet (10 mg total) by mouth every 6 (six) hours as needed for nausea or vomiting.    rOPINIRole (REQUIP) 0.25 MG tablet TAKE ONE TABLET BY MOUTH EVERYDAY AT BEDTIME    senna (SENOKOT) 8.6 MG tablet Take 1 tablet by mouth daily as needed for constipation.    SODIUM FLUORIDE 5000 SENSITIVE 1.1-5 % GEL Take by  mouth 2 (two) times daily.    Specialty Vitamins Products (MAGNESIUM, AMINO ACID CHELATE,) 133 MG tablet Take 1 tablet by mouth at bedtime.    SYNJARDY 12.05-998 MG TABS TAKE ONE TABLET BY MOUTH TWICE DAILY    TRULICITY 3 TY/6.0AY SOPN inject 78m AS DIRECTED ONCE A WEEK    Vitamin D, Ergocalciferol, (DRISDOL) 1.25 MG (50000 UNIT) CAPS capsule TAKE ONE CAPSULE BY MOUTH ONCE WEEKLY ON FRIDAY    VYVANSE 40 MG capsule TAKE ONE CAPSULE BY MOUTH EVERY MORNING    zolpidem (AMBIEN) 10 MG tablet TAKE ONE TABLET BY MOUTH EVERYDAY AT BEDTIME AS NEEDED    Facility-Administered Encounter Medications as of 02/25/2021  Medication   sodium chloride flush (NS) 0.9 % injection 10 mL    TPattricia Boss CMuskogeePharmacist Assistant [phone]

## 2021-03-04 NOTE — Telephone Encounter (Signed)
Vascepa denied due to Dx code and/or failure of previous trial.  Coordinated with Felicity Coyer to see how to get it approved

## 2021-03-15 ENCOUNTER — Telehealth: Payer: Self-pay | Admitting: Hematology and Oncology

## 2021-03-15 NOTE — Telephone Encounter (Signed)
Abdominal U/S has been scheduled for 03/16/21 @ 9:30 am ; Check in at 9.  Notified pt of date,time and instructions.

## 2021-03-16 ENCOUNTER — Other Ambulatory Visit: Payer: Self-pay | Admitting: Family Medicine

## 2021-03-16 DIAGNOSIS — Z9049 Acquired absence of other specified parts of digestive tract: Secondary | ICD-10-CM | POA: Diagnosis not present

## 2021-03-16 DIAGNOSIS — K7460 Unspecified cirrhosis of liver: Secondary | ICD-10-CM | POA: Diagnosis not present

## 2021-03-16 DIAGNOSIS — F5081 Binge eating disorder: Secondary | ICD-10-CM

## 2021-03-16 DIAGNOSIS — K7581 Nonalcoholic steatohepatitis (NASH): Secondary | ICD-10-CM | POA: Diagnosis not present

## 2021-03-17 ENCOUNTER — Ambulatory Visit: Payer: HMO

## 2021-03-17 ENCOUNTER — Telehealth: Payer: Self-pay

## 2021-03-17 DIAGNOSIS — L97524 Non-pressure chronic ulcer of other part of left foot with necrosis of bone: Secondary | ICD-10-CM

## 2021-03-17 DIAGNOSIS — E119 Type 2 diabetes mellitus without complications: Secondary | ICD-10-CM

## 2021-03-17 NOTE — Chronic Care Management (AMB) (Signed)
Chronic Care Management Pharmacy Assistant   Name: Jaylean Buenaventura  MRN: [id number] DOB: 04/04/1970   Reason for Encounter: Medication Coordination for [facility]    Recent office visits:  None  Recent consult visits:  02/19/21 (Oncology) Rosanne Sack PA-C. Seen for Malignant Neoplasm. D/C Insulin Glargine 52units and FULPHILA [place].   Hospital visits:  None  Medications: Outpatient Encounter Medications as of 03/17/2021  Medication Sig Note   atorvastatin (LIPITOR) 10 MG tablet TAKE ONE TABLET BY MOUTH ONCE DAILY    Blood Glucose Monitoring Suppl (ONETOUCH VERIO REFLECT) w/Device KIT AS DIRECTED    buPROPion (WELLBUTRIN XL) 300 MG 24 hr tablet TAKE ONE TABLET BY MOUTH EVERY EVENING    busPIRone (BUSPAR) 5 MG tablet TAKE ONE TABLET BY MOUTH THREE TIMES DAILY    calcium citrate-vitamin D (CITRACAL+D) 315-200 MG-UNIT tablet Take 1 tablet by mouth 2 (two) times daily.    clotrimazole-betamethasone (LOTRISONE) cream Apply small amount to affected area twice daily    Continuous Blood Gluc Receiver (FREESTYLE LIBRE 2 READER) DEVI E11.69 Check blood sugar 4 times daily as directed    Continuous Blood Gluc Sensor (FREESTYLE LIBRE 2 SENSOR) MISC E11.69 Change sensor every 14 days as directed    cyclobenzaprine (FLEXERIL) 10 MG tablet TAKE ONE TABLET BY MOUTH EVERY 8 HOURS AS NEEDED FOR MUSCLE SPASM    dicyclomine (BENTYL) 20 MG tablet TAKE ONE TABLET BY MOUTH BEFORE MEALS AND AT BEDTIME AS NEEDED FOR STOMACH CRAMPING    famotidine (PEPCID) 20 MG tablet TAKE ONE TABLET BY MOUTH TWICE DAILY    ferrous sulfate 325 (65 FE) MG tablet Take 325 mg by mouth every evening.    gabapentin (NEURONTIN) 300 MG capsule TAKE TWO CAPSULES BY MOUTH THREE TIMES DAILY    icosapent Ethyl (VASCEPA) 1 g capsule Take 2 capsules (2 g total) by mouth 2 (two) times daily.    insulin degludec (TRESIBA FLEXTOUCH) 200 UNIT/ML FlexTouch Pen Inject 52 Units into the skin daily.    Levomilnacipran HCl ER (FETZIMA) 80  MG CP24 Take 1 capsule by mouth daily.    levothyroxine (SYNTHROID) 75 MCG tablet TAKE ONE TABLET BY MOUTH ONCE DAILY    loratadine (CLARITIN) 10 MG tablet Take 10 mg by mouth daily. Taking 3-4 days each week due to bone pain associated with shot from cancer center.    losartan (COZAAR) 50 MG tablet TAKE ONE TABLET BY MOUTH EVERY EVENING    Magnesium 500 MG CAPS 500 mg.    morphine (MS CONTIN) 30 MG 12 hr tablet TAKE ONE TABLET BY MOUTH EVERY TWELVE HOURS    Multiple Vitamin (MULTIVITAMIN WITH MINERALS) TABS tablet Take 1 tablet by mouth daily.  06/08/2018: Pt plans on purchasing again when she can get out.   ondansetron (ZOFRAN) 4 MG tablet Take 1 tablet (4 mg total) by mouth every 4 (four) hours as needed for nausea.    OneTouch Delica Lancets 56Y MISC 1 each by Does not apply route daily before breakfast. Check blood sugar twice daily.    pantoprazole (PROTONIX) 40 MG tablet Take 1 tablet (40 mg total) by mouth 2 (two) times daily.    potassium chloride (MICRO-K) 10 MEQ CR capsule Take 2 capsules (20 mEq total) by mouth 2 (two) times daily.    pravastatin (PRAVACHOL) 20 MG tablet Take 20 mg by mouth at bedtime.    prochlorperazine (COMPAZINE) 10 MG tablet Take 1 tablet (10 mg total) by mouth every 6 (six) hours as needed for nausea  or vomiting.    rOPINIRole (REQUIP) 0.25 MG tablet TAKE ONE TABLET BY MOUTH EVERYDAY AT BEDTIME    senna (SENOKOT) 8.6 MG tablet Take 1 tablet by mouth daily as needed for constipation.    SODIUM FLUORIDE 5000 SENSITIVE 1.1-5 % GEL Take by mouth 2 (two) times daily.    Specialty Vitamins Products (MAGNESIUM, AMINO ACID CHELATE,) 133 MG tablet Take 1 tablet by mouth at bedtime.    SYNJARDY 12.05-998 MG TABS TAKE ONE TABLET BY MOUTH TWICE DAILY    TRULICITY 3 BC/4.8GQ SOPN inject 25m AS DIRECTED ONCE A WEEK    Vitamin D, Ergocalciferol, (DRISDOL) 1.25 MG (50000 UNIT) CAPS capsule TAKE ONE CAPSULE BY MOUTH ONCE WEEKLY ON FRIDAY    VYVANSE 40 MG capsule TAKE ONE CAPSULE  BY MOUTH EVERY MORNING    zolpidem (AMBIEN) 10 MG tablet TAKE ONE TABLET BY MOUTH EVERYDAY AT BEDTIME AS NEEDED    Facility-Administered Encounter Medications as of 03/17/2021  Medication   sodium chloride flush (NS) 0.9 % injection 10 mL    Reviewed chart for medication changes ahead of medication coordination call.  No OVs,or hospital visits since last care coordination call/Pharmacist visit.   No medication changes indicated OR if recent visit, treatment plan here.  BP Readings from Last 3 Encounters:  02/19/21 (!) 107/55  01/20/21 126/78  11/19/20 (!) 146/72    Lab Results  Component Value Date   HGBA1C 8.2 (H) 11/16/2020     Patient obtains medications through V[facility]  30 Days   Last adherence delivery included:  Buspirone 5 mg- 1 tab 3 times daily  Morphine ER 30 mg-  1 capsule every 12 hours Clotrim-beta cream twice daily Ropinirole -0.25 mg- 1 tablet daily (bedtime) Zolpidem- 10 mg- 1 tablet daily (bedtime) Potassium CL ER 10 meq- 2 tablets twice daily Losartan Potassium 50 mg- 1 tablet every evening  Famotidine 20 mg- 1 tablet twice daily Bupropion XL 300 mg -1 tablet daily (bedtime) Protonix 40 mg- 1 tablet before breakfast and before evening meal Levothyroxine 75 mcg- 1 tablet daily Fetzima. ER 80 mg- 1 capsule daily Gabapentin 300 mg -2 capsule 3 times daily Zofran 438mTake 1 tablet (4 mg total) by mouth every 4 (four) hours as needed     Pravastatin 20 mg- 1 tablet daily (bedtime) Synjardy- 12.5-1,000 mg - 1 tablet twice daily Vascepa 1gm- 2 capsules twice daily  Atorvastatin 10 mg 1 tab once daily  Freestyle kit 2 Sensor- Use as directed and change every 14 days.  Vitamin D2 1250 mcg- once every Friday. Cyclobenzaprine 10 mg- 1 tab every 8 hours prn  Tresiba Flextouch 200u- Inject 52 units daily   Patient declined (meds) last month  Unable to talk to patient   Patient is due for next adherence delivery on: 03/30/21. Called patient and reviewed  medications and coordinated delivery.  This delivery to include: Buspirone 5 mg- 1 tab 3 times daily  Morphine ER 30 mg-  1 capsule every 12 hours Clotrim-beta cream twice daily Ropinirole -0.25 mg- 1 tablet daily (bedtime) Zolpidem- 10 mg- 1 tablet daily (bedtime) Potassium CL ER 10 meq- 2 tablets twice daily Losartan Potassium 50 mg- 1 tablet every evening  Famotidine 20 mg- 1 tablet twice daily Bupropion XL 300 mg -1 tablet daily (bedtime) Protonix 40 mg- 1 tablet before breakfast and before evening meal Levothyroxine 75 mcg- 1 tablet daily Fetzima. ER 80 mg- 1 capsule daily Gabapentin 300 mg -2 capsule 3 times daily Zofran 43m81make 1  tablet (4 mg total) by mouth every 4 (four) hours as needed     Pravastatin 20 mg- 1 tablet daily (bedtime) Synjardy- 12.5-1,000 mg - 1 tablet twice daily Vascepa 1gm- 2 capsules twice daily  Atorvastatin 10 mg 1 tab once daily  Vyvanse 56m- daily This will be sent on 03/18/21 Freestyle kit 2 Sensor- Use as directed and change every 14 days.  Vitamin D2 1250 mcg- once every Friday. Cyclobenzaprine 10 mg- 1 tab every 8 hours prn  Tresiba Flextouch 200u- Inject 52 units daily  Trulicity 375m Inject 14m35mnce a week. - This is on back order and may or may not be able to send. Pharmacy will let us [place]. Prochlorperazine 45m66make 1 tablet every 6 hours prn  Patient declined the following medications Dicyclomine 20mg67mes not use right now  Patient needs refills- Refill request sent Buspirone 5 mg Clotrim-beta cream Ropinirole -0.25 mg Zolpidem- 10 mg Morphine ER 30 mg Gabapentin 300 mg  Confirmed delivery date of 03/30/21 advised patient that pharmacy will contact them the morning of delivery.  DanieElray Mcgregor CDerbymacist Assistant  [phone]

## 2021-03-17 NOTE — Progress Notes (Signed)
SITUATION Reason for Consult: Evaluation for Prefabricated Diabetic Shoes and Bilateral Custom Diabetic Inserts. Patient / Caregiver Report: Patient would like well fitting shoes  OBJECTIVE DATA: Patient History / Diagnosis:    ICD-10-CM   1. Diabetes mellitus without complication (HCC)  D62.2     2. 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   GOALS OF SHOES AND INSOLES - Reduce shear and pressure - Reduce / Prevent callus formation - Reduce / Prevent ulceration - Protect the fragile healing compromised diabetic foot.  Patient would benefit from diabetic shoes and inserts as patient has diabetes mellitus and the patient has one or more of the following conditions: - History of previous foot ulceration. - History of pre-ulcerative callus - Peripheral neuropathy with evidence of callus formation - Foot deformity - Poor circulation  ACTIONS PERFORMED Patient was casted for insoles via crush box and measured for shoes via brannock device. Procedure was explained and patient tolerated procedure well. All questions were answered and concerns addressed.  PLAN Patient is to ensure treating physician receives and completes diabetic paperwork. Casts and shoe order are to be held until paperwork is received. Once received patient is to be scheduled for fitting in four weeks.

## 2021-03-18 ENCOUNTER — Telehealth: Payer: Self-pay

## 2021-03-18 ENCOUNTER — Other Ambulatory Visit: Payer: Self-pay

## 2021-03-18 DIAGNOSIS — Z1501 Genetic susceptibility to malignant neoplasm of breast: Secondary | ICD-10-CM

## 2021-03-18 DIAGNOSIS — G2581 Restless legs syndrome: Secondary | ICD-10-CM

## 2021-03-18 DIAGNOSIS — Z1509 Genetic susceptibility to other malignant neoplasm: Secondary | ICD-10-CM

## 2021-03-18 MED ORDER — ZOLPIDEM TARTRATE 10 MG PO TABS: ORAL_TABLET | ORAL | 5 refills | Status: DC

## 2021-03-18 MED ORDER — ROPINIROLE HCL 0.25 MG PO TABS: 0.2500 mg | ORAL_TABLET | Freq: Every day | ORAL | 1 refills | Status: DC

## 2021-03-18 MED ORDER — CLOTRIMAZOLE-BETAMETHASONE 1-0.05 % EX CREA: TOPICAL_CREAM | CUTANEOUS | 2 refills | Status: DC

## 2021-03-18 MED ORDER — BUSPIRONE HCL 5 MG PO TABS: 5.0000 mg | ORAL_TABLET | Freq: Three times a day (TID) | ORAL | 2 refills | Status: DC

## 2021-03-18 MED ORDER — MORPHINE SULFATE ER 30 MG PO TBCR
30.0000 mg | EXTENDED_RELEASE_TABLET | Freq: Two times a day (BID) | ORAL | 0 refills | Status: DC
Start: 2021-03-18 — End: 2021-04-19

## 2021-03-18 MED ORDER — GABAPENTIN 300 MG PO CAPS: 600.0000 mg | ORAL_CAPSULE | Freq: Three times a day (TID) | ORAL | 0 refills | Status: DC

## 2021-03-18 NOTE — Telephone Encounter (Signed)
Casts sent to central fabrication - HOLD FOR CMN

## 2021-03-19 NOTE — Telephone Encounter (Signed)
Remove pravastatin per direct msg from PCP

## 2021-03-22 NOTE — Progress Notes (Signed)
Subjective:  Patient ID: Linda [name], female    DOB: February 10, 1970  Age: 51 y.o. MRN: [id number]  Chief Complaint  Patient presents with   Diabetes   Hypertension   Hyperlipidemia   HPI:  Diabetes:  Complications: HYPERTENSION, CKD Glucose checking: CGM Glucose logs: 200's Hypoglycemia: no  Most recent A1C: 8.2 Current medications: Trulicity 3 mg injection once weekly, Synjardy 12.5 mg/ 1000 mg 1 tablet twice daily, Tresiba inject 52 units daily. Last Eye Exam: 04/2020 Foot checks: Daily  Hyperlipidemia: Current medications: Taking pravastatin 20 mg daily (Has Atorvastatin 10 mg 1 tablet daily), Just ran out of Vascepa 1 gm 2 capsules BID. Was denied by insurance.   Hypertensive kidney disease with Stage 3B chronic kidney disease: Complications: DM Current medications: Losartan potassium 50 mg take 1 tablet daily, Potassium Chloride 10 meq take 2 capsules BID  Acquired Hypothyroidism: Patient is currently taking Levothyroxine 75 mg daily.  Chronic Pain Syndrome: Chronic back pain fairly well controlled.  Morphine 30 mg take 1 tablet BID, Gabapentin 300 mg take 2 capsules TID.  Binge eating disorder: Patient is currently taking Vyvanse 40 mg 1 capsule daily.  Vitamin D deficiency: Patient is currently taking Vitamin D 50,000 units 1 capsule weekly.  GERD: Patient is currently taking Pantoprazole 40 mg 1 tablet BID, Famotidine 20 mg 1 tablet BID.  Depression, mild, recurrent: Patient is currently taking Fetzima 80 mg daily, Bupropion 300 mg 1 tablet daily, Buspirone 5 mg take 1 tablet three times a day. Medications working well.   PHQ9 SCORE ONLY 03/23/2021 01/20/2021 11/16/2020  PHQ-9 Total Score 3 4 13    Diet: fairly healthy.  Exercise: walking  Current Outpatient Medications on File Prior to Visit  Medication Sig Dispense Refill   atorvastatin (LIPITOR) 10 MG tablet TAKE ONE TABLET BY MOUTH ONCE DAILY 90 tablet 0   Blood Glucose Monitoring Suppl (ONETOUCH  VERIO REFLECT) w/Device KIT AS DIRECTED     buPROPion (WELLBUTRIN XL) 300 MG 24 hr tablet TAKE ONE TABLET BY MOUTH EVERY EVENING 90 tablet 1   busPIRone (BUSPAR) 5 MG tablet Take 1 tablet (5 mg total) by mouth 3 (three) times daily. 90 tablet 2   calcium citrate-vitamin D (CITRACAL+D) 315-200 MG-UNIT tablet Take 1 tablet by mouth 2 (two) times daily.     clotrimazole-betamethasone (LOTRISONE) cream Apply twice daily as needed to area of concern 45 g 2   Continuous Blood Gluc Receiver (FREESTYLE LIBRE 2 READER) DEVI E11.69 Check blood sugar 4 times daily as directed 1 each 0   Continuous Blood Gluc Sensor (FREESTYLE LIBRE 2 SENSOR) MISC E11.69 Change sensor every 14 days as directed 6 each 3   cyclobenzaprine (FLEXERIL) 10 MG tablet TAKE ONE TABLET BY MOUTH EVERY 8 HOURS AS NEEDED FOR MUSCLE SPASM 30 tablet 3   dicyclomine (BENTYL) 20 MG tablet TAKE ONE TABLET BY MOUTH BEFORE MEALS AND AT BEDTIME AS NEEDED FOR STOMACH CRAMPING 180 tablet 1   famotidine (PEPCID) 20 MG tablet TAKE ONE TABLET BY MOUTH TWICE DAILY 180 tablet 1   ferrous sulfate 325 (65 FE) MG tablet Take 325 mg by mouth every evening.     gabapentin (NEURONTIN) 300 MG capsule Take 2 capsules (600 mg total) by mouth 3 (three) times daily. 180 capsule 0   insulin degludec (TRESIBA FLEXTOUCH) 200 UNIT/ML FlexTouch Pen Inject 52 Units into the skin daily. 9 mL 3   Levomilnacipran HCl ER (FETZIMA) 80 MG CP24 Take 1 capsule by mouth daily. 90 capsule 1  levothyroxine (SYNTHROID) 75 MCG tablet TAKE ONE TABLET BY MOUTH ONCE DAILY 90 tablet 1   loratadine (CLARITIN) 10 MG tablet Take 10 mg by mouth daily. Taking 3-4 days each week due to bone pain associated with shot from cancer center.     losartan (COZAAR) 50 MG tablet TAKE ONE TABLET BY MOUTH EVERY EVENING 90 tablet 1   Magnesium 500 MG CAPS 500 mg.     morphine (MS CONTIN) 30 MG 12 hr tablet Take 1 tablet (30 mg total) by mouth every 12 (twelve) hours. 60 tablet 0   Multiple Vitamin  (MULTIVITAMIN WITH MINERALS) TABS tablet Take 1 tablet by mouth daily.      ondansetron (ZOFRAN) 4 MG tablet Take 1 tablet (4 mg total) by mouth every 4 (four) hours as needed for nausea. 90 tablet 3   OneTouch Delica Lancets 64Q MISC 1 each by Does not apply route daily before breakfast. Check blood sugar twice daily. 100 each 3   pantoprazole (PROTONIX) 40 MG tablet Take 1 tablet (40 mg total) by mouth 2 (two) times daily. 180 tablet 1   potassium chloride (MICRO-K) 10 MEQ CR capsule Take 2 capsules (20 mEq total) by mouth 2 (two) times daily. 360 capsule 0   pravastatin (PRAVACHOL) 20 MG tablet Take 20 mg by mouth at bedtime. (Patient not taking: Reported on 03/19/2021)     prochlorperazine (COMPAZINE) 10 MG tablet Take 1 tablet (10 mg total) by mouth every 6 (six) hours as needed for nausea or vomiting. 90 tablet 3   rOPINIRole (REQUIP) 0.25 MG tablet Take 1 tablet (0.25 mg total) by mouth at bedtime. 90 tablet 1   SODIUM FLUORIDE 5000 SENSITIVE 1.1-5 % GEL Take by mouth 2 (two) times daily.     Specialty Vitamins Products (MAGNESIUM, AMINO ACID CHELATE,) 133 MG tablet Take 1 tablet by mouth at bedtime.     SYNJARDY 12.05-998 MG TABS TAKE ONE TABLET BY MOUTH TWICE DAILY 034 tablet 0   TRULICITY 3 VQ/2.5ZD SOPN inject 65m AS DIRECTED ONCE A WEEK 2 mL 2   Vitamin D, Ergocalciferol, (DRISDOL) 1.25 MG (50000 UNIT) CAPS capsule TAKE ONE CAPSULE BY MOUTH ONCE WEEKLY ON FRIDAY 12 capsule 1   zolpidem (AMBIEN) 10 MG tablet One before bed 30 tablet 5   Current Facility-Administered Medications on File Prior to Visit  Medication Dose Route Frequency Provider Last Rate Last Admin   sodium chloride flush (NS) 0.9 % injection 10 mL  10 mL Intracatheter PRN Mosher, Kelli A, PA-C   10 mL at 02/19/21 1004   Past Medical History:  Diagnosis Date   Acute postoperative respiratory insufficiency 04/17/2020   [place] 04/17/[place]   Anemia    Anemia, unspecified    Anemia, unspecified     Anemia, unspecified    Anxiety    Arthritis    BRCA gene mutation positive    Chronic pain syndrome    Depression    Diabetes mellitus without complication ([place]    type 2   Diabetic ulcer of toe of left foot associated with type 2 diabetes mellitus, with fat layer exposed ([place] 03/27/2020   Gastritis    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 No longer has   Past Surgical History:  Procedure Laterality Date   APPENDECTOMY     BACK SURGERY     x 3 lower disc   BILATERAL TOTAL MASTECTOMY WITH AXILLARY LYMPH NODE DISSECTION Bilateral 09/2019   CHOLECYSTECTOMY     nephrolithiasis     ROBOTIC ASSISTED TOTAL HYSTERECTOMY WITH BILATERAL SALPINGO OOPHERECTOMY Bilateral 06/14/2016   Procedure: ROBOTIC ASSISTED TOTAL HYSTERECTOMY WITH BILATERAL SALPINGO OOPHORECTOMY;  Surgeon: Nancy Marus, MD;  Location: [facility];  Service: Gynecology;  Laterality: Bilateral;    Family History  Problem Relation Age of Onset   Cancer Mother        breast   CAD Father    Diabetes Father    Heart failure Father    Cancer Father        renal carcinoma   Kidney failure Father    Cancer Brother 39       renal carcinoma.   Stroke Paternal Grandmother    Social History   Socioeconomic History   Marital status: Divorced    Spouse name: Not on file   Number of children: Not on file   Years of education: Not on file   Highest education level: Not on file  Occupational History   Occupation: disabled  Tobacco Use    Smoking status: Never   Smokeless tobacco: Never  Substance and Sexual Activity   Alcohol use: No   Drug use: No   Sexual activity: Yes  Other Topics Concern   Not on file  Social History Narrative   wears sunscreen, brushes and flosses daily, see's dentist bi-annually, has smoke/carbon monoxide detectors, wears a seatbelt and practices gun safety   Social Determinants of Health   Financial Resource Strain: Not on file  Food Insecurity: Not on file  Transportation Needs: Not on file  Physical Activity: Not on file  Stress: Not on file  Social Connections: Not on file    Review of Systems  Constitutional:  Negative for appetite change, chills, fatigue and fever.  HENT:  Negative for congestion, ear pain, rhinorrhea, sinus pressure and sore throat.   Respiratory:  Negative for cough, chest tightness, shortness of breath and wheezing.   Cardiovascular:  Negative for chest pain and palpitations.  Gastrointestinal:  Negative for abdominal pain, constipation, diarrhea, nausea and vomiting.  Genitourinary:  Negative for dysuria, hematuria and urgency.  Musculoskeletal:  Positive for arthralgias (Rt knee) and back pain. Negative for joint swelling and myalgias.  Skin:  Negative for rash.  Neurological:  Negative for dizziness, weakness, light-headedness and headaches.  Psychiatric/Behavioral:  Negative for dysphoric mood. The patient is not nervous/anxious.     Objective:  BP 118/70    Pulse 94    Temp (!) 96.9 F (36.1 C)    Ht 5' 2"  (1.575 m)    Wt 198 lb (89.8 kg)    LMP 06/13/2009    SpO2 100%    BMI 36.21 kg/m   BP/Weight 03/23/2021 02/19/2021 79/02/4095  Systolic BP 353 299 242  Diastolic BP 70 55 78  Wt. (Lbs) 198 196.4 193  BMI 36.21 35.92 35.3    Physical Exam Vitals reviewed.  Constitutional:      Appearance: Normal appearance. She is obese.  Neck:     Vascular: No carotid bruit.  Cardiovascular:     Rate and Rhythm: Normal rate and regular rhythm.     Heart  sounds: Normal heart sounds.  Pulmonary:     Effort: Pulmonary effort is normal. No respiratory distress.     Breath sounds: Normal breath sounds.  Abdominal:     General: Abdomen is flat. Bowel sounds are normal.     Palpations: Abdomen is soft.     Tenderness: There is no abdominal tenderness.  Musculoskeletal:        General: Tenderness (lumbar vertebrae and lumbar paraspinal muscles) present.  Neurological:     Mental Status: She is alert and oriented to person, place, and time.  Psychiatric:        Mood and Affect: Mood normal.        Behavior: Behavior normal.    Diabetic Foot Exam - Simple   Simple Foot Form Diabetic Foot exam was performed with the following findings: Yes 03/23/2021  7:58 PM  Visual Inspection See comments: Yes Sensation Testing Intact to touch and monofilament testing bilaterally: Yes Pulse Check Posterior Tibialis and Dorsalis pulse intact bilaterally: Yes Comments Callused end of great toes and medial side of first MTP.      Lab Results  Component Value Date   WBC 6.2 11/16/2020   HGB 12.7 11/16/2020   HCT 40.4 11/16/2020   PLT 96 (LL) 11/16/2020   GLUCOSE 236 (H) 11/16/2020   CHOL 227 (H) 11/16/2020   TRIG 284 (H) 11/16/2020   HDL 46 11/16/2020   LDLCALC 130 (H) 11/16/2020   ALT 22 11/16/2020   AST 18 11/16/2020   NA 140 11/16/2020   K 4.1 11/16/2020   CL 99 11/16/2020   CREATININE 1.03 (H) 11/16/2020   BUN 10 11/16/2020   CO2 25 11/16/2020   TSH 1.440 07/24/2020   HGBA1C 8.2 (H) 11/16/2020   MICROALBUR 30 10/09/2019      Assessment & Plan:   Problem List Items Addressed This Visit       Cardiovascular and Mediastinum   Hypertension complicating diabetes ([place])    The current medical regimen is effective;  continue present plan and medications. Await labs/testing for assessment and recommendations.       Relevant Medications   omega-3 acid ethyl esters (LOVAZA) 1 g capsule   Other Relevant Orders   CBC With  Diff/Platelet     Digestive   Liver cirrhosis secondary to NASH (nonalcoholic steatohepatitis) (HCC) (Chronic)    Stable. Ultrasound unchanged.         Endocrine   Acquired hypothyroidism    The current medical regimen is effective;  continue present plan and medications. Continue levothyroxine 75 mg once in am.      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 Recommend annual eye exams. Medicines: Trulicity 3 mg injection once weekly, Synjardy 12.5 mg/ 1000 mg 1 tablet twice daily, Tresiba inject 52 units daily. Continue to work on eating a healthy diet and exercise.  Labs drawn today.         Relevant Orders   Microalbumin / creatinine urine ratio   Hemoglobin A1c   Type 2 diabetes mellitus with hyperglycemia (HCC)    Control: poor CGM Recommend check feet daily. Recommend annual eye exams. Medicines: Continue Trulicity 3 mg injection once weekly, Synjardy 12.5 mg/ 1000 mg 1 tablet twice daily, Tresiba inject 52 units  daily. Continue to work on eating a healthy diet and exercise.  Labs drawn today.           Hematopoietic and Hemostatic   Idiopathic thrombocytopenic purpura (ITP) (HCC)    Stable.       Relevant Orders   CBC With Diff/Platelet     Other   BRCA1 positive (Chronic)   Mixed hyperlipidemia    The current medical regimen is effective;  continue present plan and medications. Await labs/testing for assessment and recommendations. Vascepa is not on formulary  Sent lovaza 1 gm 2 capsules twice daily.       Relevant Medications   omega-3 acid ethyl esters (LOVAZA) 1 g capsule   Other Relevant Orders   Comprehensive metabolic panel   Lipid panel   Vitamin D insufficiency    The current medical regimen is effective;  continue present plan and medications.       Class 2 severe obesity due to excess calories with serious comorbidity and body mass index (BMI) of 36.0 to 36.9 in  adult [facility])    Recommend continue to work on eating healthy diet and exercise.  Comorbidities: diabetes, hyperlipidemia      Relevant Medications   lisdexamfetamine (VYVANSE) 50 MG capsule   Chronic pain syndrome    Due to back pain.  The current medical regimen is effective;  continue present plan and medications.       Depression, major, recurrent, mild ([place])    The current medical regimen is effective;  continue present plan and medications. Continue Fetzima 80 mg daily, Bupropion 300 mg 1 tablet daily, and Buspirone 5 mg take 1 tablet three times a day       Uncomplicated opioid dependence (HCC)    Dependence.  Pain contract previously signed.       Binge eating disorder    Increase vyvanse 50 mg once daily in am.      Relevant Medications   lisdexamfetamine (VYVANSE) 50 MG capsule  .  Meds ordered this encounter  Medications   lisdexamfetamine (VYVANSE) 50 MG capsule    Sig: Take 1 capsule (50 mg total) by mouth daily.    Dispense:  30 capsule    Refill:  0   omega-3 acid ethyl esters (LOVAZA) 1 g capsule    Sig: Take 2 capsules (2 g total) by mouth 2 (two) times daily.    Dispense:  360 capsule    Refill:  1    Orders Placed This Encounter  Procedures   CBC With Diff/Platelet   Microalbumin / creatinine urine ratio   Comprehensive metabolic panel   Lipid panel   Hemoglobin A1c   TSH    Total time spent on today's visit was greater than 30 minutes, including both face-to-face time and nonface-to-face time personally spent on review of chart (labs and imaging), discussing treatment options, reviewing outside records of pertinent, answering patient's questions.  Follow-up: Return in about 3 months (around 06/20/2021) for chronic fasting.  An After Visit Summary was printed and given to the patient.  [name], MD [facility] [phone]

## 2021-03-23 ENCOUNTER — Other Ambulatory Visit: Payer: Self-pay

## 2021-03-23 ENCOUNTER — Telehealth: Payer: Self-pay

## 2021-03-23 ENCOUNTER — Ambulatory Visit (INDEPENDENT_AMBULATORY_CARE_PROVIDER_SITE_OTHER): Payer: HMO | Admitting: Family Medicine

## 2021-03-23 ENCOUNTER — Encounter: Payer: Self-pay | Admitting: Family Medicine

## 2021-03-23 VITALS — BP 118/70 | HR 94 | Temp 96.9°F | Ht 62.0 in | Wt 198.0 lb

## 2021-03-23 DIAGNOSIS — Z1501 Genetic susceptibility to malignant neoplasm of breast: Secondary | ICD-10-CM

## 2021-03-23 DIAGNOSIS — F5081 Binge eating disorder: Secondary | ICD-10-CM

## 2021-03-23 DIAGNOSIS — I129 Hypertensive chronic kidney disease with stage 1 through stage 4 chronic kidney disease, or unspecified chronic kidney disease: Secondary | ICD-10-CM | POA: Diagnosis not present

## 2021-03-23 DIAGNOSIS — I152 Hypertension secondary to endocrine disorders: Secondary | ICD-10-CM

## 2021-03-23 DIAGNOSIS — E1159 Type 2 diabetes mellitus with other circulatory complications: Secondary | ICD-10-CM

## 2021-03-23 DIAGNOSIS — E1165 Type 2 diabetes mellitus with hyperglycemia: Secondary | ICD-10-CM

## 2021-03-23 DIAGNOSIS — E1121 Type 2 diabetes mellitus with diabetic nephropathy: Secondary | ICD-10-CM | POA: Diagnosis not present

## 2021-03-23 DIAGNOSIS — D693 Immune thrombocytopenic purpura: Secondary | ICD-10-CM | POA: Diagnosis not present

## 2021-03-23 DIAGNOSIS — I10 Essential (primary) hypertension: Secondary | ICD-10-CM | POA: Insufficient documentation

## 2021-03-23 DIAGNOSIS — E782 Mixed hyperlipidemia: Secondary | ICD-10-CM | POA: Diagnosis not present

## 2021-03-23 DIAGNOSIS — F330 Major depressive disorder, recurrent, mild: Secondary | ICD-10-CM

## 2021-03-23 DIAGNOSIS — K7460 Unspecified cirrhosis of liver: Secondary | ICD-10-CM

## 2021-03-23 DIAGNOSIS — G894 Chronic pain syndrome: Secondary | ICD-10-CM | POA: Diagnosis not present

## 2021-03-23 DIAGNOSIS — E039 Hypothyroidism, unspecified: Secondary | ICD-10-CM

## 2021-03-23 DIAGNOSIS — E559 Vitamin D deficiency, unspecified: Secondary | ICD-10-CM | POA: Diagnosis not present

## 2021-03-23 DIAGNOSIS — K7581 Nonalcoholic steatohepatitis (NASH): Secondary | ICD-10-CM

## 2021-03-23 DIAGNOSIS — N1832 Chronic kidney disease, stage 3b: Secondary | ICD-10-CM | POA: Diagnosis not present

## 2021-03-23 DIAGNOSIS — Z6836 Body mass index (BMI) 36.0-36.9, adult: Secondary | ICD-10-CM

## 2021-03-23 DIAGNOSIS — Z794 Long term (current) use of insulin: Secondary | ICD-10-CM

## 2021-03-23 DIAGNOSIS — F1120 Opioid dependence, uncomplicated: Secondary | ICD-10-CM

## 2021-03-23 DIAGNOSIS — Z1509 Genetic susceptibility to other malignant neoplasm: Secondary | ICD-10-CM

## 2021-03-23 MED ORDER — LISDEXAMFETAMINE DIMESYLATE 50 MG PO CAPS: 50.0000 mg | ORAL_CAPSULE | Freq: Every day | ORAL | 0 refills | Status: DC

## 2021-03-23 MED ORDER — OMEGA-3-ACID ETHYL ESTERS 1 G PO CAPS: 2.0000 g | ORAL_CAPSULE | Freq: Two times a day (BID) | ORAL | 1 refills | Status: DC

## 2021-03-23 NOTE — Chronic Care Management (AMB) (Signed)
Chronic Care Management Pharmacy Assistant   Name: Linda [name]  MRN: [id number] DOB: 1970-12-17  Reason for Encounter: Patient Assistance Coordination  03/23/2021- [employer] grant enrollment started for Vascepa for the Hypercholesterolemia foundation. Enrollment accepted, verification of Medicare enrollment and recipients household income is needed to process approval and funds of $2500. Called patient to inform, no answer, left message of documents needed and to return call or bring to office. Will fax documents to [phone] when received.     Medications: Outpatient Encounter Medications as of 03/23/2021  Medication Sig Note   atorvastatin (LIPITOR) 10 MG tablet TAKE ONE TABLET BY MOUTH ONCE DAILY    Blood Glucose Monitoring Suppl (ONETOUCH VERIO REFLECT) w/Device KIT AS DIRECTED    buPROPion (WELLBUTRIN XL) 300 MG 24 hr tablet TAKE ONE TABLET BY MOUTH EVERY EVENING    busPIRone (BUSPAR) 5 MG tablet Take 1 tablet (5 mg total) by mouth 3 (three) times daily.    calcium citrate-vitamin D (CITRACAL+D) 315-200 MG-UNIT tablet Take 1 tablet by mouth 2 (two) times daily.    clotrimazole-betamethasone (LOTRISONE) cream Apply twice daily as needed to area of concern    Continuous Blood Gluc Receiver (FREESTYLE LIBRE 2 READER) DEVI E11.69 Check blood sugar 4 times daily as directed    Continuous Blood Gluc Sensor (FREESTYLE LIBRE 2 SENSOR) MISC E11.69 Change sensor every 14 days as directed    cyclobenzaprine (FLEXERIL) 10 MG tablet TAKE ONE TABLET BY MOUTH EVERY 8 HOURS AS NEEDED FOR MUSCLE SPASM    dicyclomine (BENTYL) 20 MG tablet TAKE ONE TABLET BY MOUTH BEFORE MEALS AND AT BEDTIME AS NEEDED FOR STOMACH CRAMPING    famotidine (PEPCID) 20 MG tablet TAKE ONE TABLET BY MOUTH TWICE DAILY    ferrous sulfate 325 (65 FE) MG tablet Take 325 mg by mouth every evening.    gabapentin (NEURONTIN) 300 MG capsule Take 2 capsules (600 mg total) by mouth 3 (three) times daily.    icosapent Ethyl  (VASCEPA) 1 g capsule Take 2 capsules (2 g total) by mouth 2 (two) times daily.    insulin degludec (TRESIBA FLEXTOUCH) 200 UNIT/ML FlexTouch Pen Inject 52 Units into the skin daily.    Levomilnacipran HCl ER (FETZIMA) 80 MG CP24 Take 1 capsule by mouth daily.    levothyroxine (SYNTHROID) 75 MCG tablet TAKE ONE TABLET BY MOUTH ONCE DAILY    lisdexamfetamine (VYVANSE) 50 MG capsule Take 1 capsule (50 mg total) by mouth daily.    loratadine (CLARITIN) 10 MG tablet Take 10 mg by mouth daily. Taking 3-4 days each week due to bone pain associated with shot from cancer center.    losartan (COZAAR) 50 MG tablet TAKE ONE TABLET BY MOUTH EVERY EVENING    Magnesium 500 MG CAPS 500 mg.    morphine (MS CONTIN) 30 MG 12 hr tablet Take 1 tablet (30 mg total) by mouth every 12 (twelve) hours.    Multiple Vitamin (MULTIVITAMIN WITH MINERALS) TABS tablet Take 1 tablet by mouth daily.  06/08/2018: Pt plans on purchasing again when she can get out.   ondansetron (ZOFRAN) 4 MG tablet Take 1 tablet (4 mg total) by mouth every 4 (four) hours as needed for nausea.    OneTouch Delica Lancets 99I MISC 1 each by Does not apply route daily before breakfast. Check blood sugar twice daily.    pantoprazole (PROTONIX) 40 MG tablet Take 1 tablet (40 mg total) by mouth 2 (two) times daily.    potassium chloride (MICRO-K) 10 MEQ  CR capsule Take 2 capsules (20 mEq total) by mouth 2 (two) times daily.    pravastatin (PRAVACHOL) 20 MG tablet Take 20 mg by mouth at bedtime. (Patient not taking: Reported on 03/19/2021)    prochlorperazine (COMPAZINE) 10 MG tablet Take 1 tablet (10 mg total) by mouth every 6 (six) hours as needed for nausea or vomiting.    rOPINIRole (REQUIP) 0.25 MG tablet Take 1 tablet (0.25 mg total) by mouth at bedtime.    senna (SENOKOT) 8.6 MG tablet Take 1 tablet by mouth daily as needed for constipation.    SODIUM FLUORIDE 5000 SENSITIVE 1.1-5 % GEL Take by mouth 2 (two) times daily.    Specialty Vitamins Products  (MAGNESIUM, AMINO ACID CHELATE,) 133 MG tablet Take 1 tablet by mouth at bedtime.    SYNJARDY 12.05-998 MG TABS TAKE ONE TABLET BY MOUTH TWICE DAILY    TRULICITY 3 JL/8.7GB SOPN inject 37m AS DIRECTED ONCE A WEEK    Vitamin D, Ergocalciferol, (DRISDOL) 1.25 MG (50000 UNIT) CAPS capsule TAKE ONE CAPSULE BY MOUTH ONCE WEEKLY ON FRIDAY    zolpidem (AMBIEN) 10 MG tablet One before bed    Facility-Administered Encounter Medications as of 03/23/2021  Medication   sodium chloride flush (NS) 0.9 % injection 10 mL   TPattricia Boss CLake GenevaPharmacist Assistant [phone]

## 2021-03-23 NOTE — Assessment & Plan Note (Signed)
Stable. Ultrasound unchanged.

## 2021-03-23 NOTE — Assessment & Plan Note (Addendum)
The current medical regimen is effective;  continue present plan and medications. Continue Fetzima 80 mg daily, Bupropion 300 mg 1 tablet daily, and Buspirone 5 mg take 1 tablet three times a day

## 2021-03-23 NOTE — Assessment & Plan Note (Signed)
The current medical regimen is effective;  continue present plan and medications. Await labs/testing for assessment and recommendations. Vascepa is not on formulary  Sent lovaza 1 gm 2 capsules twice daily.

## 2021-03-23 NOTE — Assessment & Plan Note (Signed)
Dependence.  Pain contract previously signed.

## 2021-03-23 NOTE — Assessment & Plan Note (Signed)
Control: poor CGM Recommend check feet daily. Recommend annual eye exams. Medicines: Trulicity 3 mg injection once weekly, Synjardy 12.5 mg/ 1000 mg 1 tablet twice daily, Tresiba inject 52 units daily. Continue to work on eating a healthy diet and exercise.  Labs drawn today.

## 2021-03-23 NOTE — Assessment & Plan Note (Signed)
Recommend continue to work on eating healthy diet and exercise.  Comorbidities: diabetes, hyperlipidemia

## 2021-03-23 NOTE — Assessment & Plan Note (Signed)
Stable

## 2021-03-23 NOTE — Assessment & Plan Note (Signed)
Increase vyvanse 50 mg once daily in am.

## 2021-03-23 NOTE — Assessment & Plan Note (Signed)
The current medical regimen is effective;  continue present plan and medications.  

## 2021-03-23 NOTE — Assessment & Plan Note (Signed)
Control: poor CGM Recommend check feet daily. Recommend annual eye exams. Medicines: Continue Trulicity 3 mg injection once weekly, Synjardy 12.5 mg/ 1000 mg 1 tablet twice daily, Tresiba inject 52 units daily. Continue to work on eating a healthy diet and exercise.  Labs drawn today.

## 2021-03-23 NOTE — Assessment & Plan Note (Signed)
The current medical regimen is effective;  continue present plan and medications. Await labs/testing for assessment and recommendations.

## 2021-03-23 NOTE — Assessment & Plan Note (Signed)
Due to back pain.  The current medical regimen is effective;  continue present plan and medications.

## 2021-03-23 NOTE — Assessment & Plan Note (Signed)
>>  ASSESSMENT AND PLAN FOR MODERATELY SEVERE RECURRENT MAJOR DEPRESSION (HCC) WRITTEN ON 03/23/2021  8:19 PM BY [name], KIRSTEN, MD  The current medical regimen is effective;  continue present plan and medications. Continue Fetzima 80 mg daily, Bupropion 300 mg 1 tablet daily, and Buspirone 5 mg take 1 tablet three times a day

## 2021-03-23 NOTE — Assessment & Plan Note (Signed)
The current medical regimen is effective;  continue present plan and medications. Continue levothyroxine 75 mg once in am.

## 2021-03-24 LAB — HEMOGLOBIN A1C
Est. average glucose Bld gHb Est-mCnc: 157 mg/dL
Hgb A1c MFr Bld: 7.1 % — ABNORMAL HIGH (ref 4.8–5.6)

## 2021-03-24 LAB — TSH: TSH: 2.81 u[IU]/mL (ref 0.450–4.500)

## 2021-03-24 LAB — CBC WITH DIFF/PLATELET
Basophils Absolute: 0 10*3/uL (ref 0.0–0.2)
Basos: 1 %
EOS (ABSOLUTE): 0.1 10*3/uL (ref 0.0–0.4)
Eos: 2 %
Hematocrit: 40.9 % (ref 34.0–46.6)
Hemoglobin: 13 g/dL (ref 11.1–15.9)
Immature Grans (Abs): 0 10*3/uL (ref 0.0–0.1)
Immature Granulocytes: 0 %
Lymphocytes Absolute: 1.7 10*3/uL (ref 0.7–3.1)
Lymphs: 27 %
MCH: 27.2 pg (ref 26.6–33.0)
MCHC: 31.8 g/dL (ref 31.5–35.7)
MCV: 86 fL (ref 79–97)
Monocytes Absolute: 0.4 10*3/uL (ref 0.1–0.9)
Monocytes: 7 %
Neutrophils Absolute: 4 10*3/uL (ref 1.4–7.0)
Neutrophils: 63 %
Platelets: 99 10*3/uL — CL (ref 150–450)
RBC: 4.78 x10E6/uL (ref 3.77–5.28)
RDW: 14 % (ref 11.7–15.4)
WBC: 6.3 10*3/uL (ref 3.4–10.8)

## 2021-03-24 LAB — LIPID PANEL
Chol/HDL Ratio: 3.1 ratio (ref 0.0–4.4)
Cholesterol, Total: 157 mg/dL (ref 100–199)
HDL: 50 mg/dL (ref >39)
LDL Chol Calc (NIH): 78 mg/dL (ref 0–99)
Triglycerides: 170 mg/dL — ABNORMAL HIGH (ref 0–149)
VLDL Cholesterol Cal: 29 mg/dL (ref 5–40)

## 2021-03-24 LAB — COMPREHENSIVE METABOLIC PANEL
ALT: 31 IU/L (ref 0–32)
AST: 33 IU/L (ref 0–40)
Albumin/Globulin Ratio: 2 (ref 1.2–2.2)
Albumin: 4.4 g/dL (ref 3.8–4.9)
Alkaline Phosphatase: 97 IU/L (ref 44–121)
BUN/Creatinine Ratio: 12 (ref 9–23)
BUN: 13 mg/dL (ref 6–24)
Bilirubin Total: 0.5 mg/dL (ref 0.0–1.2)
CO2: 24 mmol/L (ref 20–29)
Calcium: 9.4 mg/dL (ref 8.7–10.2)
Chloride: 102 mmol/L (ref 96–106)
Creatinine, Ser: 1.06 mg/dL — ABNORMAL HIGH (ref 0.57–1.00)
Globulin, Total: 2.2 g/dL (ref 1.5–4.5)
Glucose: 134 mg/dL — ABNORMAL HIGH (ref 70–99)
Potassium: 4.4 mmol/L (ref 3.5–5.2)
Sodium: 142 mmol/L (ref 134–144)
Total Protein: 6.6 g/dL (ref 6.0–8.5)
eGFR: 63 mL/min/{1.73_m2} (ref >59)

## 2021-03-24 LAB — MICROALBUMIN / CREATININE URINE RATIO
Creatinine, Urine: 88.6 mg/dL
Microalb/Creat Ratio: <3 mg/g creat (ref 0–29)
Microalbumin, Urine: <3 ug/mL

## 2021-03-24 LAB — CARDIOVASCULAR RISK ASSESSMENT

## 2021-03-30 ENCOUNTER — Telehealth: Payer: Self-pay

## 2021-03-30 NOTE — Chronic Care Management (AMB) (Signed)
? ? ?Chronic Care Management ?Pharmacy Assistant  ? ?Name: Linda [name]  MRN: [id number] DOB: 1970-06-29 ? ?Reason for Encounter: Patient Assistance Coordination ? ?03/30/2021- Spoke with patient, she has received a letter from the [employer] regarding income documents and medicare enrollment. Patient will bring by a copy of her recent tax returns to the office today or tomorrow. Patient aware I will fax over information on Thursday and will follow up with [facility] on status of enrollment.   ? ?Medications: ?Outpatient Encounter Medications as of 03/30/2021  ?Medication Sig Note  ? atorvastatin (LIPITOR) 10 MG tablet TAKE ONE TABLET BY MOUTH ONCE DAILY   ? Blood Glucose Monitoring Suppl (ONETOUCH VERIO REFLECT) w/Device KIT AS DIRECTED   ? buPROPion (WELLBUTRIN XL) 300 MG 24 hr tablet TAKE ONE TABLET BY MOUTH EVERY EVENING   ? busPIRone (BUSPAR) 5 MG tablet Take 1 tablet (5 mg total) by mouth 3 (three) times daily.   ? calcium citrate-vitamin D (CITRACAL+D) 315-200 MG-UNIT tablet Take 1 tablet by mouth 2 (two) times daily.   ? clotrimazole-betamethasone (LOTRISONE) cream Apply twice daily as needed to area of concern   ? Continuous Blood Gluc Receiver (FREESTYLE LIBRE 2 READER) DEVI E11.69 Check blood sugar 4 times daily as directed   ? Continuous Blood Gluc Sensor (FREESTYLE LIBRE 2 SENSOR) MISC E11.69 Change sensor every 14 days as directed   ? cyclobenzaprine (FLEXERIL) 10 MG tablet TAKE ONE TABLET BY MOUTH EVERY 8 HOURS AS NEEDED FOR MUSCLE SPASM   ? dicyclomine (BENTYL) 20 MG tablet TAKE ONE TABLET BY MOUTH BEFORE MEALS AND AT BEDTIME AS NEEDED FOR STOMACH CRAMPING   ? famotidine (PEPCID) 20 MG tablet TAKE ONE TABLET BY MOUTH TWICE DAILY   ? ferrous sulfate 325 (65 FE) MG tablet Take 325 mg by mouth every evening.   ? gabapentin (NEURONTIN) 300 MG capsule Take 2 capsules (600 mg total) by mouth 3 (three) times daily.   ? insulin degludec (TRESIBA FLEXTOUCH) 200 UNIT/ML FlexTouch Pen Inject 52  Units into the skin daily.   ? Levomilnacipran HCl ER (FETZIMA) 80 MG CP24 Take 1 capsule by mouth daily.   ? levothyroxine (SYNTHROID) 75 MCG tablet TAKE ONE TABLET BY MOUTH ONCE DAILY   ? lisdexamfetamine (VYVANSE) 50 MG capsule Take 1 capsule (50 mg total) by mouth daily.   ? loratadine (CLARITIN) 10 MG tablet Take 10 mg by mouth daily. Taking 3-4 days each week due to bone pain associated with shot from cancer center.   ? losartan (COZAAR) 50 MG tablet TAKE ONE TABLET BY MOUTH EVERY EVENING   ? Magnesium 500 MG CAPS 500 mg.   ? morphine (MS CONTIN) 30 MG 12 hr tablet Take 1 tablet (30 mg total) by mouth every 12 (twelve) hours.   ? Multiple Vitamin (MULTIVITAMIN WITH MINERALS) TABS tablet Take 1 tablet by mouth daily.  06/08/2018: Pt plans on purchasing again when she can get out.  ? omega-3 acid ethyl esters (LOVAZA) 1 g capsule Take 2 capsules (2 g total) by mouth 2 (two) times daily.   ? ondansetron (ZOFRAN) 4 MG tablet Take 1 tablet (4 mg total) by mouth every 4 (four) hours as needed for nausea.   ? OneTouch Delica Lancets 31V MISC 1 each by Does not apply route daily before breakfast. Check blood sugar twice daily.   ? pantoprazole (PROTONIX) 40 MG tablet Take 1 tablet (40 mg total) by mouth 2 (two) times daily.   ? potassium chloride (MICRO-K) 10 MEQ  CR capsule Take 2 capsules (20 mEq total) by mouth 2 (two) times daily.   ? pravastatin (PRAVACHOL) 20 MG tablet Take 20 mg by mouth at bedtime. (Patient not taking: Reported on 03/19/2021)   ? prochlorperazine (COMPAZINE) 10 MG tablet Take 1 tablet (10 mg total) by mouth every 6 (six) hours as needed for nausea or vomiting.   ? rOPINIRole (REQUIP) 0.25 MG tablet Take 1 tablet (0.25 mg total) by mouth at bedtime.   ? SODIUM FLUORIDE 5000 SENSITIVE 1.1-5 % GEL Take by mouth 2 (two) times daily.   ? Specialty Vitamins Products (MAGNESIUM, AMINO ACID CHELATE,) 133 MG tablet Take 1 tablet by mouth at bedtime.   ? SYNJARDY 12.05-998 MG TABS TAKE ONE TABLET BY MOUTH  TWICE DAILY   ? TRULICITY 3 RK/9.3XL SOPN inject 17m AS DIRECTED ONCE A WEEK   ? Vitamin D, Ergocalciferol, (DRISDOL) 1.25 MG (50000 UNIT) CAPS capsule TAKE ONE CAPSULE BY MOUTH ONCE WEEKLY ON FRIDAY   ? zolpidem (AMBIEN) 10 MG tablet One before bed   ? ?Facility-Administered Encounter Medications as of 03/30/2021  ?Medication  ? sodium chloride flush (NS) 0.9 % injection 10 mL  ? ? ?TPattricia Boss CMA ?Clinical Pharmacist Assistant ?[phone]? ?

## 2021-04-06 ENCOUNTER — Telehealth: Payer: Self-pay

## 2021-04-06 NOTE — Chronic Care Management (AMB) (Signed)
04/05/2021- Faxed income documentation and copy of insurance card to [employer]. Called patient to inform, no answer, voicemail unavailable.  ? ?Pattricia Boss, CMA ?Clinical Pharmacist Assistant ?[phone] ? ?

## 2021-04-16 ENCOUNTER — Other Ambulatory Visit: Payer: Self-pay | Admitting: Family Medicine

## 2021-04-16 DIAGNOSIS — Z7985 Long-term (current) use of injectable non-insulin antidiabetic drugs: Secondary | ICD-10-CM | POA: Diagnosis not present

## 2021-04-16 DIAGNOSIS — F5081 Binge eating disorder: Secondary | ICD-10-CM

## 2021-04-16 DIAGNOSIS — E1169 Type 2 diabetes mellitus with other specified complication: Secondary | ICD-10-CM | POA: Diagnosis not present

## 2021-04-16 DIAGNOSIS — C50911 Malignant neoplasm of unspecified site of right female breast: Secondary | ICD-10-CM | POA: Diagnosis not present

## 2021-04-16 DIAGNOSIS — Z794 Long term (current) use of insulin: Secondary | ICD-10-CM | POA: Diagnosis not present

## 2021-04-16 DIAGNOSIS — E1140 Type 2 diabetes mellitus with diabetic neuropathy, unspecified: Secondary | ICD-10-CM | POA: Diagnosis not present

## 2021-04-16 DIAGNOSIS — Z9221 Personal history of antineoplastic chemotherapy: Secondary | ICD-10-CM | POA: Diagnosis not present

## 2021-04-16 DIAGNOSIS — C50912 Malignant neoplasm of unspecified site of left female breast: Secondary | ICD-10-CM | POA: Diagnosis not present

## 2021-04-16 DIAGNOSIS — K7460 Unspecified cirrhosis of liver: Secondary | ICD-10-CM | POA: Diagnosis not present

## 2021-04-19 ENCOUNTER — Telehealth: Payer: Self-pay

## 2021-04-19 ENCOUNTER — Other Ambulatory Visit: Payer: Self-pay

## 2021-04-19 ENCOUNTER — Other Ambulatory Visit: Payer: HMO

## 2021-04-19 ENCOUNTER — Inpatient Hospital Stay: Payer: HMO | Attending: Hematology and Oncology

## 2021-04-19 VITALS — BP 140/79 | HR 99 | Temp 97.8°F | Resp 18 | Ht 62.0 in | Wt 200.8 lb

## 2021-04-19 DIAGNOSIS — Z171 Estrogen receptor negative status [ER-]: Secondary | ICD-10-CM

## 2021-04-19 DIAGNOSIS — C50311 Malignant neoplasm of lower-inner quadrant of right female breast: Secondary | ICD-10-CM | POA: Diagnosis not present

## 2021-04-19 DIAGNOSIS — Z1501 Genetic susceptibility to malignant neoplasm of breast: Secondary | ICD-10-CM

## 2021-04-19 DIAGNOSIS — F5081 Binge eating disorder: Secondary | ICD-10-CM

## 2021-04-19 DIAGNOSIS — Z452 Encounter for adjustment and management of vascular access device: Secondary | ICD-10-CM | POA: Insufficient documentation

## 2021-04-19 MED ORDER — LOSARTAN POTASSIUM 50 MG PO TABS: 50.0000 mg | ORAL_TABLET | Freq: Every evening | ORAL | 1 refills | Status: DC

## 2021-04-19 MED ORDER — FAMOTIDINE 20 MG PO TABS: 20.0000 mg | ORAL_TABLET | Freq: Two times a day (BID) | ORAL | 1 refills | Status: DC

## 2021-04-19 MED ORDER — BUPROPION HCL ER (XL) 300 MG PO TB24: 300.0000 mg | ORAL_TABLET | Freq: Every evening | ORAL | 1 refills | Status: DC

## 2021-04-19 MED ORDER — HEPARIN SOD (PORK) LOCK FLUSH 100 UNIT/ML IV SOLN
500.0000 [IU] | Freq: Once | INTRAVENOUS | Status: AC | PRN
  Administered 2021-04-19: 500 [IU]

## 2021-04-19 MED ORDER — LISDEXAMFETAMINE DIMESYLATE 50 MG PO CAPS: 50.0000 mg | ORAL_CAPSULE | Freq: Every day | ORAL | 0 refills | Status: DC

## 2021-04-19 MED ORDER — SODIUM CHLORIDE 0.9% FLUSH
10.0000 mL | INTRAVENOUS | Status: DC | PRN
  Administered 2021-04-19: 10 mL

## 2021-04-19 MED ORDER — LEVOTHYROXINE SODIUM 75 MCG PO TABS: 75.0000 ug | ORAL_TABLET | Freq: Every day | ORAL | 1 refills | Status: DC

## 2021-04-19 MED ORDER — POTASSIUM CHLORIDE ER 10 MEQ PO CPCR: 20.0000 meq | ORAL_CAPSULE | Freq: Two times a day (BID) | ORAL | 0 refills | Status: DC

## 2021-04-19 MED ORDER — ONDANSETRON HCL 4 MG PO TABS: 4.0000 mg | ORAL_TABLET | ORAL | 3 refills | Status: DC | PRN

## 2021-04-19 MED ORDER — MORPHINE SULFATE ER 30 MG PO TBCR: 30.0000 mg | EXTENDED_RELEASE_TABLET | Freq: Two times a day (BID) | ORAL | 0 refills | Status: DC

## 2021-04-19 MED ORDER — GABAPENTIN 300 MG PO CAPS: 600.0000 mg | ORAL_CAPSULE | Freq: Three times a day (TID) | ORAL | 0 refills | Status: DC

## 2021-04-19 NOTE — Patient Instructions (Signed)
[employer], Adult ?A central line is a long, thin tube (catheter) that can be used to collect blood for testing or to give medicine through a vein. The tip of the central line ends in a large vein just above the heart (vena cava). ?A central line may be placed because: ?You need to get medicines or fluids through an IV for a long period of time. ?You need nutrition but cannot eat or absorb nutrients. ?The veins in your hands or arms are difficult to use for IV access. ?You need a blood transfusion. ?You need chemotherapy or dialysis. ?Types of central lines ?There are four main types of central lines: ?Peripherally inserted central catheter (PICC) line. This type is used for access of one week or longer. It can be used to draw blood and give fluids or medicines. A PICC looks like an IV tube, but it goes up the arm to the heart. It is usually inserted in the upper arm and taped in place on the arm. ?Tunneled central line. This type is used for long-term therapy and dialysis. It is placed in a large vein in the neck, chest, or groin. It is inserted through a small incision made over the vein, and then it is advanced to the heart. It is tunneled under the skin and brought out through a second incision. ?Non-tunneled central line. This type is used for short-term access, usually for a maximum of 7 days. It is often used in the emergency department. It is inserted in the neck, chest, or groin. ?Implanted port. This type is used for long-term therapy. It can stay in place longer than other types of central lines. It is normally inserted in the upper chest, but it can also be placed in the upper arm or the abdomen. It is inserted and removed with surgery, and it is accessed using a needle. ?The type of central line that you receive depends on how long you will need it, your medical condition, and the condition of your veins. ?Tell a health care provider about: ?Any allergies you have. ?All medicines you are taking,  including vitamins, herbs, eye drops, creams, and over-the-counter medicines. ?Any problems you or family members have had with anesthetic medicines. ?Any blood disorders you have. ?Any surgeries you have had. ?Any medical conditions you have. ?Whether you are pregnant or may be pregnant. ?What are the risks? ?Generally, placement and use of a central line is safe. However, problems may occur, including: ?Infection. ?A blood clot that blocks the central line or forms in the vein and travels to the heart. ?Bleeding from the place where the central line was inserted. ?Developing a hole or crack within the central line. If this happens, the central line will need to be replaced. ?Central line failure. ?The catheter moving or coming out of place. ?What happens before the procedure? ?Medicines ?Ask your health care provider about: ?Changing or stopping your regular medicines. This is especially important if you are taking diabetes medicines or blood thinners. ?Taking medicines such as aspirin and ibuprofen. These medicines can thin your blood. Do not take these medicines unless your health care provider tells you to take them. ?Taking over-the-counter medicines, vitamins, herbs, and supplements. ?General instructions ?Follow instructions from your health care provider about eating or drinking restrictions. ?Ask your health care provider: ?How your procedure site will be marked. ?What steps will be taken to help prevent infection. These steps may include: ?Removing hair at the procedure site. ?Washing skin with a germ-killing soap. ?  Plan to have a responsible adult take you home from the hospital or clinic. ?If you will be going home right after the procedure, plan to have a responsible adult care for you for the time you are told. This is important. ?What happens during the procedure? ?The procedure will vary depending on the type of central line being placed. In general: ?An IV will be inserted into one of your  veins. ?You will be given one or more of the following: ?A medicine to help you relax (sedative). ?A medicine to numb the area (local anesthetic). ?Your skin will be cleaned with a germ-killing (antiseptic) solution, and you may be covered with sterile drapes. ?Your blood pressure, heart rate, breathing rate, and blood oxygen level will be monitored during the procedure. ?The central line catheter will be inserted into the vein and advanced to the correct spot. The health care provider may use X-ray equipment to help guide the catheter to the right place. ?A bandage (dressing) will be placed over the insertion area. ?The procedure may vary among health care providers and hospitals. ?What can I expect after the procedure? ?Your blood pressure, heart rate, breathing rate, and blood oxygen level will be monitored until you leave the hospital or clinic. ?Antiseptic caps may be placed on the ends of the central line tubing. ?If you were given a sedative during the procedure, it can affect you for several hours. Do not drive or operate machinery until your health care provider says that it is safe. ?Follow these instructions at home: ?Flushing and cleaning the central line ? ?Follow instructions from your health care provider about flushing and cleaning the central line and the area around it. ?Only use sterile supplies to flush the central line. Use supplies from your health care provider, a pharmacy, or another source that is recommended by your health care provider. ?Before you flush the central line or clean the central line or the area around it: ?Wash your hands with soap and water for at least 20 seconds. If soap and water are not available, use alcohol-based hand sanitizer. ?Clean the central line hub with rubbing alcohol. Unless directed otherwise by the manufacturer's instructions, scrub using a twisting motion and rub for 10 to 15 seconds or for 30 twists. Be sure you scrub the top of the hub, not just the  sides. Never reuse alcohol pads. Let the hub dry before use. Prevent it from touching anything while drying. ?Caring for the incision or central line site ?Check your incision or central line site every day for signs of infection. Check for: ?Redness, swelling, or pain. ?Fluid or blood. ?Warmth. ?Pus or a bad smell. ?Keep the insertion site of your central line clean and dry at all times. ?Change your dressing only as told by your health care provider. ?Keep your dressing dry. If it gets wet, have it changed as soon as possible. ?General instructions ?Follow instructions from your health care provider for the type of device that you have. ?Keep the tube clamped, unless it is being used. ?If the central line accidentally gets pulled on, make sure: ?The dressing is okay. ?There is no bleeding. ?The line has not been pulled out. ?Do not use scissors or sharp objects near the tube. ?Do not take baths, swim, or use a hot tub until your health care provider approves. Ask your health care provider if you may take showers. You may only be allowed to take sponge baths. ?Ask your health care provider what activities are  safe for you. You may be restricted from lifting or making repetitive arm movements on the side of your central line. ?Take over-the-counter and prescription medicines only as told by your health care provider. ?Keep all follow-up visits. This is important. ?Storage and disposal of supplies ?Keep your supplies in a clean, dry location. ?Throw away any used syringes in a disposal container that is meant for sharp items (sharps container). You can buy a sharps container from a pharmacy, or you can make one by using an empty hard plastic bottle with a cover. ?Place any used dressings or infusion bags into a plastic bag. Throw that bag in the trash. ?Contact a health care provider if: ?You have redness, swelling, or pain around your insertion site. ?You have fluid or blood coming from your insertion site. ?Your  insertion site feels warm to the touch. ?You have pus or a bad smell coming from your insertion site. ?Get help right away if: ?You have: ?A fever or chills. ?Shortness of breath. ?Chest pain or a racing heartbeat. ?Swe

## 2021-04-19 NOTE — Progress Notes (Signed)
1153-pt called and message was left.  Per dr Hinton Rao pt is to follow up with her pcp regarding the pain she is having.  If it is anything to do with her cancer or her blood, the pcp will let dr Hinton Rao know.   ?

## 2021-04-19 NOTE — Chronic Care Management (AMB) (Signed)
? ? ?Chronic Care Management ?Pharmacy Assistant  ? ?Name: Linda [name]  MRN: [id number] DOB: 07-13-1970 ? ?Reason for Encounter: Prior Authorization Coordination ? ?04/19/2021- Prior Authorization started in CovermyMeds for Vyvanse 50 mg. Awaiting determination. ?Checked Covermymeds, Vyvanse Approved today ?27-MAR-23:31-DEC-23 Vyvanse 50MG OR CAPS Quantity:30. [facility] notified. ?  ?Medications: ?Outpatient Encounter Medications as of 04/19/2021  ?Medication Sig Note  ? atorvastatin (LIPITOR) 10 MG tablet TAKE ONE TABLET BY MOUTH ONCE DAILY   ? Blood Glucose Monitoring Suppl (ONETOUCH VERIO REFLECT) w/Device KIT AS DIRECTED   ? buPROPion (WELLBUTRIN XL) 300 MG 24 hr tablet TAKE ONE TABLET BY MOUTH EVERY EVENING   ? busPIRone (BUSPAR) 5 MG tablet Take 1 tablet (5 mg total) by mouth 3 (three) times daily.   ? calcium citrate-vitamin D (CITRACAL+D) 315-200 MG-UNIT tablet Take 1 tablet by mouth 2 (two) times daily.   ? clotrimazole-betamethasone (LOTRISONE) cream Apply twice daily as needed to area of concern   ? Continuous Blood Gluc Receiver (FREESTYLE LIBRE 2 READER) DEVI E11.69 Check blood sugar 4 times daily as directed   ? Continuous Blood Gluc Sensor (FREESTYLE LIBRE 2 SENSOR) MISC E11.69 Change sensor every 14 days as directed   ? cyclobenzaprine (FLEXERIL) 10 MG tablet TAKE ONE TABLET BY MOUTH EVERY 8 HOURS AS NEEDED FOR MUSCLE SPASM   ? dicyclomine (BENTYL) 20 MG tablet TAKE ONE TABLET BY MOUTH BEFORE MEALS AND AT BEDTIME AS NEEDED FOR STOMACH CRAMPING   ? famotidine (PEPCID) 20 MG tablet TAKE ONE TABLET BY MOUTH TWICE DAILY   ? ferrous sulfate 325 (65 FE) MG tablet Take 325 mg by mouth every evening.   ? gabapentin (NEURONTIN) 300 MG capsule Take 2 capsules (600 mg total) by mouth 3 (three) times daily.   ? insulin degludec (TRESIBA FLEXTOUCH) 200 UNIT/ML FlexTouch Pen Inject 52 Units into the skin daily.   ? Levomilnacipran HCl ER (FETZIMA) 80 MG CP24 Take 1 capsule by mouth daily.   ?  levothyroxine (SYNTHROID) 75 MCG tablet TAKE ONE TABLET BY MOUTH ONCE DAILY   ? lisdexamfetamine (VYVANSE) 50 MG capsule Take 1 capsule (50 mg total) by mouth daily.   ? loratadine (CLARITIN) 10 MG tablet Take 10 mg by mouth daily. Taking 3-4 days each week due to bone pain associated with shot from cancer center.   ? losartan (COZAAR) 50 MG tablet TAKE ONE TABLET BY MOUTH EVERY EVENING   ? Magnesium 500 MG CAPS 500 mg.   ? morphine (MS CONTIN) 30 MG 12 hr tablet Take 1 tablet (30 mg total) by mouth every 12 (twelve) hours.   ? Multiple Vitamin (MULTIVITAMIN WITH MINERALS) TABS tablet Take 1 tablet by mouth daily.  06/08/2018: Pt plans on purchasing again when she can get out.  ? omega-3 acid ethyl esters (LOVAZA) 1 g capsule Take 2 capsules (2 g total) by mouth 2 (two) times daily.   ? ondansetron (ZOFRAN) 4 MG tablet Take 1 tablet (4 mg total) by mouth every 4 (four) hours as needed for nausea.   ? OneTouch Delica Lancets 67H MISC 1 each by Does not apply route daily before breakfast. Check blood sugar twice daily.   ? pantoprazole (PROTONIX) 40 MG tablet Take 1 tablet (40 mg total) by mouth 2 (two) times daily.   ? potassium chloride (MICRO-K) 10 MEQ CR capsule Take 2 capsules (20 mEq total) by mouth 2 (two) times daily.   ? pravastatin (PRAVACHOL) 20 MG tablet Take 20 mg by mouth at bedtime. (Patient  not taking: Reported on 03/19/2021)   ? prochlorperazine (COMPAZINE) 10 MG tablet Take 1 tablet (10 mg total) by mouth every 6 (six) hours as needed for nausea or vomiting.   ? rOPINIRole (REQUIP) 0.25 MG tablet Take 1 tablet (0.25 mg total) by mouth at bedtime.   ? SODIUM FLUORIDE 5000 SENSITIVE 1.1-5 % GEL Take by mouth 2 (two) times daily.   ? Specialty Vitamins Products (MAGNESIUM, AMINO ACID CHELATE,) 133 MG tablet Take 1 tablet by mouth at bedtime.   ? SYNJARDY 12.05-998 MG TABS TAKE ONE TABLET BY MOUTH TWICE DAILY   ? TRULICITY 3 MD/4.7WL SOPN inject 66m AS DIRECTED ONCE A WEEK   ? Vitamin D, Ergocalciferol,  (DRISDOL) 1.25 MG (50000 UNIT) CAPS capsule TAKE ONE CAPSULE BY MOUTH ONCE WEEKLY ON FRIDAY   ? zolpidem (AMBIEN) 10 MG tablet One before bed   ? ?Facility-Administered Encounter Medications as of 04/19/2021  ?Medication  ? sodium chloride flush (NS) 0.9 % injection 10 mL  ? sodium chloride flush (NS) 0.9 % injection 10 mL  ? ?TPattricia Boss CMA ?Clinical Pharmacist Assistant ?[phone]? ?

## 2021-04-19 NOTE — Progress Notes (Signed)
? ? ?Chronic Care Management ?Pharmacy Assistant  ? ?Name: Linda [name]  MRN: [id number] DOB: 08/24/1970 ? ? ?Reason for Encounter: Medication Coordination for [facility]  ?  ?Recent office visits:  ?03/23/21 [name] MD. Seen for DM, HTN, HLD. Started on Omega 3-Acit 2g two times daily.Increase vyvanse 50 mg once daily in am. D/C Vascepa 2g and Sennoside 0.44m ? ?Recent consult visits:  ?None ? ?Hospital visits:  ?None ? ?Medications: ?Outpatient Encounter Medications as of 04/19/2021  ?Medication Sig Note  ? atorvastatin (LIPITOR) 10 MG tablet TAKE ONE TABLET BY MOUTH ONCE DAILY   ? Blood Glucose Monitoring Suppl (ONETOUCH VERIO REFLECT) w/Device KIT AS DIRECTED   ? buPROPion (WELLBUTRIN XL) 300 MG 24 hr tablet TAKE ONE TABLET BY MOUTH EVERY EVENING   ? busPIRone (BUSPAR) 5 MG tablet Take 1 tablet (5 mg total) by mouth 3 (three) times daily.   ? calcium citrate-vitamin D (CITRACAL+D) 315-200 MG-UNIT tablet Take 1 tablet by mouth 2 (two) times daily.   ? clotrimazole-betamethasone (LOTRISONE) cream Apply twice daily as needed to area of concern   ? Continuous Blood Gluc Receiver (FREESTYLE LIBRE 2 READER) DEVI E11.69 Check blood sugar 4 times daily as directed   ? Continuous Blood Gluc Sensor (FREESTYLE LIBRE 2 SENSOR) MISC E11.69 Change sensor every 14 days as directed   ? cyclobenzaprine (FLEXERIL) 10 MG tablet TAKE ONE TABLET BY MOUTH EVERY 8 HOURS AS NEEDED FOR MUSCLE SPASM   ? dicyclomine (BENTYL) 20 MG tablet TAKE ONE TABLET BY MOUTH BEFORE MEALS AND AT BEDTIME AS NEEDED FOR STOMACH CRAMPING   ? famotidine (PEPCID) 20 MG tablet TAKE ONE TABLET BY MOUTH TWICE DAILY   ? ferrous sulfate 325 (65 FE) MG tablet Take 325 mg by mouth every evening.   ? gabapentin (NEURONTIN) 300 MG capsule Take 2 capsules (600 mg total) by mouth 3 (three) times daily.   ? insulin degludec (TRESIBA FLEXTOUCH) 200 UNIT/ML FlexTouch Pen Inject 52 Units into the skin daily.   ? Levomilnacipran HCl ER (FETZIMA) 80 MG CP24 Take 1  capsule by mouth daily.   ? levothyroxine (SYNTHROID) 75 MCG tablet TAKE ONE TABLET BY MOUTH ONCE DAILY   ? lisdexamfetamine (VYVANSE) 50 MG capsule Take 1 capsule (50 mg total) by mouth daily.   ? loratadine (CLARITIN) 10 MG tablet Take 10 mg by mouth daily. Taking 3-4 days each week due to bone pain associated with shot from cancer center.   ? losartan (COZAAR) 50 MG tablet TAKE ONE TABLET BY MOUTH EVERY EVENING   ? Magnesium 500 MG CAPS 500 mg.   ? morphine (MS CONTIN) 30 MG 12 hr tablet Take 1 tablet (30 mg total) by mouth every 12 (twelve) hours.   ? Multiple Vitamin (MULTIVITAMIN WITH MINERALS) TABS tablet Take 1 tablet by mouth daily.  06/08/2018: Pt plans on purchasing again when she can get out.  ? omega-3 acid ethyl esters (LOVAZA) 1 g capsule Take 2 capsules (2 g total) by mouth 2 (two) times daily.   ? ondansetron (ZOFRAN) 4 MG tablet Take 1 tablet (4 mg total) by mouth every 4 (four) hours as needed for nausea.   ? OneTouch Delica Lancets 353IMISC 1 each by Does not apply route daily before breakfast. Check blood sugar twice daily.   ? pantoprazole (PROTONIX) 40 MG tablet Take 1 tablet (40 mg total) by mouth 2 (two) times daily.   ? potassium chloride (MICRO-K) 10 MEQ CR capsule Take 2 capsules (20 mEq total)  by mouth 2 (two) times daily.   ? pravastatin (PRAVACHOL) 20 MG tablet Take 20 mg by mouth at bedtime. (Patient not taking: Reported on 03/19/2021)   ? prochlorperazine (COMPAZINE) 10 MG tablet Take 1 tablet (10 mg total) by mouth every 6 (six) hours as needed for nausea or vomiting.   ? rOPINIRole (REQUIP) 0.25 MG tablet Take 1 tablet (0.25 mg total) by mouth at bedtime.   ? SODIUM FLUORIDE 5000 SENSITIVE 1.1-5 % GEL Take by mouth 2 (two) times daily.   ? Specialty Vitamins Products (MAGNESIUM, AMINO ACID CHELATE,) 133 MG tablet Take 1 tablet by mouth at bedtime.   ? SYNJARDY 12.05-998 MG TABS TAKE ONE TABLET BY MOUTH TWICE DAILY   ? TRULICITY 3 IO/0.3TD SOPN inject 31m AS DIRECTED ONCE A WEEK   ?  Vitamin D, Ergocalciferol, (DRISDOL) 1.25 MG (50000 UNIT) CAPS capsule TAKE ONE CAPSULE BY MOUTH ONCE WEEKLY ON FRIDAY   ? zolpidem (AMBIEN) 10 MG tablet One before bed   ? ?Facility-Administered Encounter Medications as of 04/19/2021  ?Medication  ? sodium chloride flush (NS) 0.9 % injection 10 mL  ? sodium chloride flush (NS) 0.9 % injection 10 mL  ? ? ?Reviewed chart for medication changes ahead of medication coordination call. ? ?No Consults, or hospital visits since last care coordination call/Pharmacist visit.  ? ? ?BP Readings from Last 3 Encounters:  ?04/19/21 140/79  ?03/23/21 118/70  ?02/19/21 (!) 107/55  ?  ?Lab Results  ?Component Value Date  ? HGBA1C 7.1 (H) 03/23/2021  ?  ? ?Patient obtains medications through Vials  30 Days  ? ?Last adherence delivery included:  ?Buspirone 5 mg- 1 tab 3 times daily  ?Morphine ER 30 mg-  1 capsule every 12 hours ?Clotrim-beta cream twice daily ?Ropinirole -0.25 mg- 1 tablet daily (bedtime) ?Zolpidem- 10 mg- 1 tablet daily (bedtime) ?Potassium CL ER 10 meq- 2 tablets twice daily ?Losartan Potassium 50 mg- 1 tablet every evening  ?Famotidine 20 mg- 1 tablet twice daily ?Bupropion XL 300 mg -1 tablet daily (bedtime) ?Protonix 40 mg- 1 tablet before breakfast and before evening meal ?Levothyroxine 75 mcg- 1 tablet daily ?Fetzima. ER 80 mg- 1 capsule daily ?Gabapentin 300 mg -2 capsule 3 times daily ?Zofran 45mTake 1 tablet (4 mg total) by mouth every 4 (four) hours as needed     ?Pravastatin 20 mg- 1 tablet daily (bedtime) ?Synjardy- 12.5-1,000 mg - 1 tablet twice daily ?Vascepa 1gm- 2 capsules twice daily  ?Atorvastatin 10 mg 1 tab once daily  ?Vyvanse 4026mdaily This will be sent on 03/18/21 ?Freestyle kit 2 Sensor- Use as directed and change every 14 days.  ?Vitamin D2 1250 mcg- once every Friday. ?Cyclobenzaprine 10 mg- 1 tab every 8 hours prn  ?TreTyler Aasextouch 200u- Inject 52 units daily  ?Trulicity 3mg45mnject 3mg 61me a week. - This is on back order and may or may  not be able to send. Pharmacy [place]. ?Prochlorperazine 10mg-13me 1 tablet every 6 hours prn ? ?Patient declined (meds) last month  ?Dicyclomine 20mg- 6m not use right now ? ?Patient is due for next adherence delivery on: 04/29/21. ?Called patient and reviewed medications and coordinated delivery. ? ?This delivery to include: ?Buspirone 5 mg- 1 tab 3 times daily  ?Morphine ER 30 mg-  1 capsule every 12 hours ?Clotrim-beta cream twice daily ?Ropinirole -0.25 mg- 1 tablet daily (bedtime) ?Zolpidem- 10 mg- 1 tablet daily (bedtime) ?Potassium CL ER 10 meq- 2 tablets twice daily ?  Losartan Potassium 50 mg- 1 tablet every evening  ?Famotidine 20 mg- 1 tablet twice daily ?Bupropion XL 300 mg -1 tablet daily (bedtime) ?Protonix 40 mg- 1 tablet before breakfast and before evening meal ?Levothyroxine 75 mcg- 1 tablet daily ?Fetzima. ER 80 mg- 1 capsule daily ?Gabapentin 300 mg -2 capsule 3 times daily ?Zofran 50m Take 1 tablet (4 mg total) by mouth every 4 (four) hours as needed     ?Synjardy- 12.5-1,000 mg - 1 tablet twice daily ?Lovaza 1gm-2 capsules twice daily ?Atorvastatin 10 mg 1 tab once daily  ?Vyvanse 529m daily This will be sent on 04/19/21 ?Freestyle kit 2 Sensor- Use as directed and change every 14 days.  ?Vitamin D2 1250 mcg- once every Friday. ?Cyclobenzaprine 10 mg- 1 tab every 8 hours prn  ?TrTyler Aaslextouch 200u- Inject 52 units daily  ?Trulicity 42m62mInject 42mg48mce a week.  ?Prochlorperazine 10mg5mke 1 tablet every 6 hours prn ? ?Patient declined the following medications  ?Pravastatin -D/C by provider  ?Dicyclomine 20mg-1ms not need  ? ?Patient needs refills -Request Sent  ?Vyvanse 50mg ?542mtidine 20 mg ?Bupropion XL 300 mg  ?Levothyroxine 75 mcg ?Potassium CL ER 10 meq ?Morphine ER 30 mg ?Losartan Potassium 50 mg ?Gabapentin 300 mg  ?Zofran 4mg  ? 25mnfirmed delivery date of 04/29/21, advised patient that pharmacy will contact them the morning of delivery. ? ?DanielleElray Mcgregorlinical  Pharmacist Assistant  ?[phone]

## 2021-04-19 NOTE — Progress Notes (Signed)
1118-Per angela evans, lpn, pt may leave and she will notify pt of dr mccarty's decision.  Pt d/c stable.  ?

## 2021-04-23 ENCOUNTER — Telehealth: Payer: Self-pay

## 2021-04-23 NOTE — Telephone Encounter (Signed)
Shoes Ordered - [employer] Wom[employer] - [employer]ace with Software engineer, [facility] [employer] ?

## 2021-04-26 ENCOUNTER — Other Ambulatory Visit: Payer: Self-pay

## 2021-04-26 MED ORDER — FREESTYLE LIBRE 2 SENSOR MISC: 3 refills | Status: DC

## 2021-04-27 ENCOUNTER — Other Ambulatory Visit: Payer: Self-pay | Admitting: Family Medicine

## 2021-04-27 DIAGNOSIS — M62838 Other muscle spasm: Secondary | ICD-10-CM

## 2021-04-29 ENCOUNTER — Telehealth: Payer: Self-pay

## 2021-04-29 NOTE — Telephone Encounter (Signed)
chest pain/sore around the incision from her breast cancer. pt states she has told Dr. Tobie Poet about a while back but [facility] thought it was anxiety. however, she had chest pain all day and then the following day her chest was sore. patient was seen by Oncology and was told to f.up with pcp. Patient was told by Oncology about a week ago to f.up with PCP. Patient has been scheduled for an appointment with Dr. Tobie Poet for tomorrow. Patient was notified that if her symptoms get worse between now and her appointment time then she needs to be seen at the hospital . Patient agreed. ?

## 2021-04-30 ENCOUNTER — Ambulatory Visit: Payer: HMO | Admitting: Legal Medicine

## 2021-04-30 ENCOUNTER — Ambulatory Visit (INDEPENDENT_AMBULATORY_CARE_PROVIDER_SITE_OTHER): Payer: HMO | Admitting: Family Medicine

## 2021-04-30 VITALS — BP 120/78 | HR 96 | Temp 97.4°F | Ht 62.0 in | Wt 200.0 lb

## 2021-04-30 DIAGNOSIS — D693 Immune thrombocytopenic purpura: Secondary | ICD-10-CM

## 2021-04-30 DIAGNOSIS — M940 Chondrocostal junction syndrome [Tietze]: Secondary | ICD-10-CM

## 2021-04-30 MED ORDER — INDOMETHACIN 50 MG PO CAPS: 50.0000 mg | ORAL_CAPSULE | Freq: Three times a day (TID) | ORAL | 0 refills | Status: DC

## 2021-04-30 NOTE — Progress Notes (Signed)
? ?Acute Office Visit ? ?Subjective:  ? ? Patient ID: Shahd Occhipinti, female    DOB: 1970-06-10, 51 y.o.   MRN: [id number] ? ?Chief Complaint  ?Patient presents with  ? Chest discomfort  ? ? ?HPI: ?Patient is in today for chest pain. States the pains come and go but have been intermittent for approx the past 2 months. Pain is sharp at times, located around her incision site. After she has a pain the next day her chest is sore/tender to the touch. Believes it may possibly be due to scar tissue. Also states she would like to have a screening to make sure her cancer has not returned, states often times it comes back on the incision line. Patient called oncology first, who told her to see me. ? ?Past Medical History:  ?Diagnosis Date  ? Acute postoperative respiratory insufficiency 04/17/2020  ? AKI (acute kidney injury) ([place]) 04/17/2020  ? Anemia   ? Anemia, unspecified   ? Anemia, unspecified   ? Anemia, unspecified   ? Anxiety   ? Arthritis   ? BRCA gene mutation positive   ? Chronic pain syndrome   ? Depression   ? Diabetes mellitus without complication ([place])   ? type 2  ? Diabetic ulcer of toe of left foot associated with type 2 diabetes mellitus, with fat layer exposed ([place]) 03/27/2020  ? Gastritis   ? 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 GERD (gastroesophageal reflux disease)   ? History of kidney stones   ? Hypertension   ? Hypothyroidism   ? Malignant neoplasm of central portion of right female breast ([place])   ? Malignant neoplasm of lower-inner quadrant of right female breast ([place])   ? Malignant neoplasm of lower-inner quadrant of right female breast ([place])   ? Mixed hyperlipidemia   ? Other primary thrombocytopenia ([place])   ? Sleep apnea   ? hx of . No longer has  ? ? ?Past Surgical History:  ?Procedure Laterality Date  ? APPENDECTOMY    ? BACK SURGERY    ? x 3 lower disc  ? BILATERAL TOTAL MASTECTOMY WITH AXILLARY LYMPH NODE  DISSECTION Bilateral 09/2019  ? CHOLECYSTECTOMY    ? nephrolithiasis    ? ROBOTIC ASSISTED TOTAL HYSTERECTOMY WITH BILATERAL SALPINGO OOPHERECTOMY Bilateral 06/14/2016  ? Procedure: ROBOTIC ASSISTED TOTAL HYSTERECTOMY WITH BILATERAL SALPINGO OOPHORECTOMY;  Surgeon: Nancy Marus, MD;  Location: [facility];  Service: Gynecology;  Laterality: Bilateral;  ? ? ?Family History  ?Problem Relation Age of Onset  ? Cancer Mother   ?     breast  ? CAD Father   ? Diabetes Father   ? Heart failure Father   ? Cancer Father   ?     renal carcinoma  ? Kidney failure Father   ? Cancer Brother 63  ?     renal carcinoma.  ? Stroke Paternal Grandmother   ? ? ?Social History  ? ?Socioeconomic History  ? Marital status: Divorced  ?  Spouse name: Not on file  ? Number of children: Not on file  ? Years of education: Not on file  ? Highest education level: Not on file  ?Occupational History  ? Occupation: disabled  ?Tobacco Use  ? Smoking status: Never  ? Smokeless tobacco: Never  ?Substance and Sexual Activity  ? Alcohol use: No  ? Drug use: No  ? Sexual activity: Yes  ?Other Topics Concern  ?  Not on file  ?Social History Narrative  ? wears sunscreen, brushes and flosses daily, see's dentist bi-annually, has smoke/carbon monoxide detectors, wears a seatbelt and practices gun safety  ? ?Social Determinants of Health  ? ?Financial Resource Strain: Not on file  ?Food Insecurity: Not on file  ?Transportation Needs: Not on file  ?Physical Activity: Not on file  ?Stress: Not on file  ?Social Connections: Not on file  ?Intimate Partner Violence: Not on file  ? ? ?Outpatient Medications Prior to Visit  ?Medication Sig Dispense Refill  ? atorvastatin (LIPITOR) 10 MG tablet TAKE ONE TABLET BY MOUTH ONCE DAILY 90 tablet 0  ? Blood Glucose Monitoring Suppl (ONETOUCH VERIO REFLECT) w/Device KIT AS DIRECTED    ? buPROPion (WELLBUTRIN XL) 300 MG 24 hr tablet Take 1 tablet (300 mg total) by mouth every evening. 90 tablet 1  ? busPIRone (BUSPAR) 5 MG tablet  Take 1 tablet (5 mg total) by mouth 3 (three) times daily. 90 tablet 2  ? calcium citrate-vitamin D (CITRACAL+D) 315-200 MG-UNIT tablet Take 1 tablet by mouth 2 (two) times daily.    ? clotrimazole-betamethasone (LOTRISONE) cream Apply twice daily as needed to area of concern 45 g 2  ? Continuous Blood Gluc Receiver (FREESTYLE LIBRE 2 READER) DEVI E11.69 Check blood sugar 4 times daily as directed 1 each 0  ? Continuous Blood Gluc Sensor (FREESTYLE LIBRE 2 SENSOR) MISC E11.69 Change sensor every 14 days as directed 6 each 3  ? cyclobenzaprine (FLEXERIL) 10 MG tablet TAKE ONE TABLET BY MOUTH EVERY 8 HOURS AS NEEDED FOR muscle SPASMS 30 tablet 3  ? dicyclomine (BENTYL) 20 MG tablet TAKE ONE TABLET BY MOUTH BEFORE MEALS AND AT BEDTIME AS NEEDED FOR STOMACH CRAMPING 180 tablet 1  ? famotidine (PEPCID) 20 MG tablet Take 1 tablet (20 mg total) by mouth 2 (two) times daily. 180 tablet 1  ? ferrous sulfate 325 (65 FE) MG tablet Take 325 mg by mouth every evening.    ? gabapentin (NEURONTIN) 300 MG capsule Take 2 capsules (600 mg total) by mouth 3 (three) times daily. 180 capsule 0  ? insulin degludec (TRESIBA FLEXTOUCH) 200 UNIT/ML FlexTouch Pen Inject 52 Units into the skin daily. 9 mL 3  ? Levomilnacipran HCl ER (FETZIMA) 80 MG CP24 Take 1 capsule by mouth daily. 90 capsule 1  ? levothyroxine (SYNTHROID) 75 MCG tablet Take 1 tablet (75 mcg total) by mouth daily. 90 tablet 1  ? lisdexamfetamine (VYVANSE) 50 MG capsule Take 1 capsule (50 mg total) by mouth daily. 30 capsule 0  ? loratadine (CLARITIN) 10 MG tablet Take 10 mg by mouth daily. Taking 3-4 days each week due to bone pain associated with shot from cancer center.    ? losartan (COZAAR) 50 MG tablet Take 1 tablet (50 mg total) by mouth every evening. 90 tablet 1  ? Magnesium 500 MG CAPS 500 mg.    ? morphine (MS CONTIN) 30 MG 12 hr tablet Take 1 tablet (30 mg total) by mouth every 12 (twelve) hours. 60 tablet 0  ? Multiple Vitamin (MULTIVITAMIN WITH MINERALS) TABS  tablet Take 1 tablet by mouth daily.     ? omega-3 acid ethyl esters (LOVAZA) 1 g capsule Take 2 capsules (2 g total) by mouth 2 (two) times daily. 360 capsule 1  ? ondansetron (ZOFRAN) 4 MG tablet Take 1 tablet (4 mg total) by mouth every 4 (four) hours as needed for nausea. 90 tablet 3  ? OneTouch Delica Lancets 05L MISC  1 each by Does not apply route daily before breakfast. Check blood sugar twice daily. 100 each 3  ? pantoprazole (PROTONIX) 40 MG tablet Take 1 tablet (40 mg total) by mouth 2 (two) times daily. 180 tablet 1  ? potassium chloride (MICRO-K) 10 MEQ CR capsule Take 2 capsules (20 mEq total) by mouth 2 (two) times daily. 360 capsule 0  ? pravastatin (PRAVACHOL) 20 MG tablet Take 20 mg by mouth at bedtime. (Patient not taking: Reported on 03/19/2021)    ? prochlorperazine (COMPAZINE) 10 MG tablet Take 1 tablet (10 mg total) by mouth every 6 (six) hours as needed for nausea or vomiting. 90 tablet 3  ? rOPINIRole (REQUIP) 0.25 MG tablet Take 1 tablet (0.25 mg total) by mouth at bedtime. 90 tablet 1  ? SODIUM FLUORIDE 5000 SENSITIVE 1.1-5 % GEL Take by mouth 2 (two) times daily.    ? Specialty Vitamins Products (MAGNESIUM, AMINO ACID CHELATE,) 133 MG tablet Take 1 tablet by mouth at bedtime.    ? SYNJARDY 12.05-998 MG TABS TAKE ONE TABLET BY MOUTH TWICE DAILY 180 tablet 0  ? TRULICITY 3 FH/5.4TG SOPN inject 53m AS DIRECTED ONCE A WEEK 2 mL 2  ? Vitamin D, Ergocalciferol, (DRISDOL) 1.25 MG (50000 UNIT) CAPS capsule TAKE ONE CAPSULE BY MOUTH ONCE WEEKLY ON FRIDAY 12 capsule 1  ? zolpidem (AMBIEN) 10 MG tablet One before bed 30 tablet 5  ? ?Facility-Administered Medications Prior to Visit  ?Medication Dose Route Frequency Provider Last Rate Last Admin  ? sodium chloride flush (NS) 0.9 % injection 10 mL  10 mL Intracatheter PRN Mosher, Kelli A, PA-C   10 mL at 02/19/21 1004  ? ? ?Allergies  ?Allergen Reactions  ? Codeine Shortness Of Breath  ?  Other reaction(s): SHOB  ? Celecoxib Other (See Comments)  ?   Unknown reaction ?Other reaction(s): Unknown  ? Ezetimibe   ?  Other reaction(s): Unknown  ? Ezetimibe-Simvastatin Other (See Comments)  ?  Unknown reaction  ? Propranolol   ?  Other reaction(s): Unknown  ? Propr

## 2021-05-03 ENCOUNTER — Encounter: Payer: Self-pay | Admitting: Family Medicine

## 2021-05-03 DIAGNOSIS — M940 Chondrocostal junction syndrome [Tietze]: Secondary | ICD-10-CM | POA: Insufficient documentation

## 2021-05-03 NOTE — Assessment & Plan Note (Addendum)
Start Indomethacin 50 mg TID with meals x 1-2 weeks.  ?

## 2021-05-03 NOTE — Assessment & Plan Note (Addendum)
Stable. Discussed brief course of nsaid with hematology. Her platelets have been stable.  ?

## 2021-05-10 ENCOUNTER — Other Ambulatory Visit: Payer: Self-pay | Admitting: Hematology and Oncology

## 2021-05-10 DIAGNOSIS — R110 Nausea: Secondary | ICD-10-CM

## 2021-05-12 ENCOUNTER — Ambulatory Visit: Payer: HMO | Admitting: Sports Medicine

## 2021-05-13 ENCOUNTER — Encounter: Payer: Self-pay | Admitting: Podiatry

## 2021-05-13 ENCOUNTER — Ambulatory Visit: Payer: HMO | Admitting: Podiatry

## 2021-05-13 DIAGNOSIS — Z794 Long term (current) use of insulin: Secondary | ICD-10-CM

## 2021-05-13 DIAGNOSIS — B351 Tinea unguium: Secondary | ICD-10-CM

## 2021-05-13 DIAGNOSIS — E1165 Type 2 diabetes mellitus with hyperglycemia: Secondary | ICD-10-CM

## 2021-05-13 DIAGNOSIS — L84 Corns and callosities: Secondary | ICD-10-CM | POA: Diagnosis not present

## 2021-05-13 DIAGNOSIS — M79674 Pain in right toe(s): Secondary | ICD-10-CM | POA: Diagnosis not present

## 2021-05-13 DIAGNOSIS — M79675 Pain in left toe(s): Secondary | ICD-10-CM

## 2021-05-15 ENCOUNTER — Other Ambulatory Visit: Payer: Self-pay | Admitting: Family Medicine

## 2021-05-15 DIAGNOSIS — E1121 Type 2 diabetes mellitus with diabetic nephropathy: Secondary | ICD-10-CM

## 2021-05-17 ENCOUNTER — Other Ambulatory Visit: Payer: Self-pay | Admitting: Family Medicine

## 2021-05-17 DIAGNOSIS — Z1501 Genetic susceptibility to malignant neoplasm of breast: Secondary | ICD-10-CM

## 2021-05-18 ENCOUNTER — Telehealth: Payer: Self-pay

## 2021-05-18 NOTE — Progress Notes (Signed)
? ? ?Chronic Care Management ?Pharmacy Assistant  ? ?Name: Linda [name]  MRN: [id number] DOB: 01/11/1971 ? ? ?Reason for Encounter: Medication Coordination for [facility]  ?  ?Recent office visits:  ?04/30/21 [name] MD. Seen for Acute Costochondritis. Started on Indomethacin 78m.  ? ?Recent consult visits:  ?None ? ?Hospital visits:  ?None ? ?Medications: ?Outpatient Encounter Medications as of 05/18/2021  ?Medication Sig Note  ? atorvastatin (LIPITOR) 10 MG tablet TAKE ONE TABLET BY MOUTH ONCE DAILY   ? Blood Glucose Monitoring Suppl (ONETOUCH VERIO REFLECT) w/Device KIT AS DIRECTED   ? buPROPion (WELLBUTRIN XL) 300 MG 24 hr tablet Take 1 tablet (300 mg total) by mouth every evening.   ? busPIRone (BUSPAR) 5 MG tablet Take 1 tablet (5 mg total) by mouth 3 (three) times daily.   ? calcium citrate-vitamin D (CITRACAL+D) 315-200 MG-UNIT tablet Take 1 tablet by mouth 2 (two) times daily.   ? clotrimazole-betamethasone (LOTRISONE) cream Apply twice daily as needed to area of concern   ? Continuous Blood Gluc Receiver (FREESTYLE LIBRE 2 READER) DEVI E11.69 Check blood sugar 4 times daily as directed   ? Continuous Blood Gluc Sensor (FREESTYLE LIBRE 2 SENSOR) MISC E11.69 Change sensor every 14 days as directed   ? cyclobenzaprine (FLEXERIL) 10 MG tablet TAKE ONE TABLET BY MOUTH EVERY 8 HOURS AS NEEDED FOR muscle SPASMS   ? dicyclomine (BENTYL) 20 MG tablet TAKE ONE TABLET BY MOUTH BEFORE MEALS AND AT BEDTIME AS NEEDED FOR STOMACH CRAMPING   ? famotidine (PEPCID) 20 MG tablet Take 1 tablet (20 mg total) by mouth 2 (two) times daily.   ? ferrous sulfate 325 (65 FE) MG tablet Take 325 mg by mouth every evening.   ? FETZIMA 80 MG CP24 TAKE ONE CAPSULE BY MOUTH ONCE DAILY   ? gabapentin (NEURONTIN) 300 MG capsule Take 2 capsules (600 mg total) by mouth 3 (three) times daily.   ? indomethacin (INDOCIN) 50 MG capsule Take 1 capsule (50 mg total) by mouth 3 (three) times daily with meals.   ? insulin degludec (TRESIBA  FLEXTOUCH) 200 UNIT/ML FlexTouch Pen Inject 52 Units into the skin daily.   ? levothyroxine (SYNTHROID) 75 MCG tablet Take 1 tablet (75 mcg total) by mouth daily.   ? lisdexamfetamine (VYVANSE) 50 MG capsule Take 1 capsule (50 mg total) by mouth daily.   ? loratadine (CLARITIN) 10 MG tablet Take 10 mg by mouth daily. Taking 3-4 days each week due to bone pain associated with shot from cancer center.   ? losartan (COZAAR) 50 MG tablet Take 1 tablet (50 mg total) by mouth every evening.   ? Magnesium 500 MG CAPS 500 mg.   ? morphine (MS CONTIN) 30 MG 12 hr tablet Take 1 tablet (30 mg total) by mouth every 12 (twelve) hours.   ? Multiple Vitamin (MULTIVITAMIN WITH MINERALS) TABS tablet Take 1 tablet by mouth daily.  06/08/2018: Pt plans on purchasing again when she can get out.  ? omega-3 acid ethyl esters (LOVAZA) 1 g capsule Take 2 capsules (2 g total) by mouth 2 (two) times daily.   ? ondansetron (ZOFRAN) 4 MG tablet Take 1 tablet (4 mg total) by mouth every 4 (four) hours as needed for nausea.   ? OneTouch Delica Lancets 340JMISC 1 each by Does not apply route daily before breakfast. Check blood sugar twice daily.   ? pantoprazole (PROTONIX) 40 MG tablet Take 1 tablet (40 mg total) by mouth 2 (two) times daily.   ?  potassium chloride (MICRO-K) 10 MEQ CR capsule Take 2 capsules (20 mEq total) by mouth 2 (two) times daily.   ? pravastatin (PRAVACHOL) 20 MG tablet Take 20 mg by mouth at bedtime. (Patient not taking: Reported on 03/19/2021)   ? prochlorperazine (COMPAZINE) 10 MG tablet TAKE ONE TABLET BY MOUTH EVERY 6 HOURS AS NEEDED FOR NAUSEA AND VOMITING   ? rOPINIRole (REQUIP) 0.25 MG tablet Take 1 tablet (0.25 mg total) by mouth at bedtime.   ? SODIUM FLUORIDE 5000 SENSITIVE 1.1-5 % GEL Take by mouth 2 (two) times daily.   ? Specialty Vitamins Products (MAGNESIUM, AMINO ACID CHELATE,) 133 MG tablet Take 1 tablet by mouth at bedtime.   ? SYNJARDY 12.05-998 MG TABS TAKE ONE TABLET BY MOUTH TWICE DAILY   ? TRULICITY 3  LZ/7.6BH SOPN inject 79m AS DIRECTED ONCE A WEEK   ? Vitamin D, Ergocalciferol, (DRISDOL) 1.25 MG (50000 UNIT) CAPS capsule TAKE ONE CAPSULE BY MOUTH ONCE WEEKLY ON FRIDAY   ? zolpidem (AMBIEN) 10 MG tablet One before bed   ? ?Facility-Administered Encounter Medications as of 05/18/2021  ?Medication  ? sodium chloride flush (NS) 0.9 % injection 10 mL  ? ? ?Reviewed chart for medication changes ahead of medication coordination call. ? ?No OVs, Consults, or hospital visits since last care coordination call/Pharmacist visit. (If appropriate, list visit date, provider name) ? ?No medication changes indicated OR if recent visit, treatment plan here. ? ?BP Readings from Last 3 Encounters:  ?04/30/21 120/78  ?04/19/21 140/79  ?03/23/21 118/70  ?  ?Lab Results  ?Component Value Date  ? HGBA1C 7.1 (H) 03/23/2021  ?  ? ?Patient obtains medications through Vials  30 Days  ? ?Last adherence delivery included:  ?Buspirone 5 mg- 1 tab 3 times daily  ?Morphine ER 30 mg-  1 capsule every 12 hours ?Clotrim-beta cream twice daily ?Ropinirole -0.25 mg- 1 tablet daily (bedtime) ?Zolpidem- 10 mg- 1 tablet daily (bedtime) ?Potassium CL ER 10 meq- 2 tablets twice daily ?Losartan Potassium 50 mg- 1 tablet every evening  ?Famotidine 20 mg- 1 tablet twice daily ?Bupropion XL 300 mg -1 tablet daily (bedtime) ?Protonix 40 mg- 1 tablet before breakfast and before evening meal ?Levothyroxine 75 mcg- 1 tablet daily ?Fetzima. ER 80 mg- 1 capsule daily ?Gabapentin 300 mg -2 capsule 3 times daily ?Zofran 426mTake 1 tablet (4 mg total) by mouth every 4 (four) hours as needed     ?Synjardy- 12.5-1,000 mg - 1 tablet twice daily ?Lovaza 1gm-2 capsules twice daily ?Atorvastatin 10 mg 1 tab once daily  ?Vyvanse 5064mdaily This will be sent on 04/19/21 ?Freestyle kit 2 Sensor- Use as directed and change every 14 days.  ?Vitamin D2 1250 mcg- once every Friday. ?Cyclobenzaprine 10 mg- 1 tab every 8 hours prn  ?TreTyler Aasextouch 200u- Inject 52 units daily   ?Trulicity 3mg64mnject 3mg 43me a week.  ?Prochlorperazine 10mg-60me 1 tablet every 6 hours prn ? ?Patient declined (meds) last month  ?Dicyclomine 20mg- 16m not use right now ?Pravastatin -D/C by provider  ? ?Patient is due for next adherence delivery on: 05/28/21. ?Called patient and reviewed medications and coordinated delivery. ? ?This delivery to include: ?Buspirone 5 mg- 1 tab 3 times daily  ?Morphine ER 30 mg-  1 capsule every 12 hours ?Ropinirole -0.25 mg- 1 tablet daily (bedtime) ?Zolpidem- 10 mg- 1 tablet daily (bedtime) ?Potassium CL ER 10 meq- 2 tablets twice daily ?Losartan Potassium 50 mg- 1 tablet every evening  ?Famotidine 20 mg-  1 tablet twice daily ?Bupropion XL 300 mg -1 tablet daily (bedtime) ?Protonix 40 mg- 1 tablet before breakfast and before evening meal ?Levothyroxine 75 mcg- 1 tablet daily ?Fetzima. ER 80 mg- 1 capsule daily ?Gabapentin 300 mg -2 capsule 3 times daily ?Zofran 25m Take 1 tablet (4 mg total) by mouth every 4 (four) hours as needed     ?Synjardy- 12.5-1,000 mg - 1 tablet twice daily ?Lovaza 1gm-2 capsules twice daily ?Atorvastatin 10 mg 1 tab once daily  ?Freestyle kit 2 Sensor- Use as directed and change every 14 days.  ?Vitamin D2 1250 mcg- once every Friday. ?Cyclobenzaprine 10 mg- 1 tab every 8 hours prn  ?TTyler AasFlextouch 200u- Inject 52 units daily  ?Trulicity 360m Inject 2m64mnce a week.  ?Prochlorperazine 25m49make 1 tablet every 6 hours prn ? ?Patient declined the following medications  ?Vyvanse 50mg25mily This will be sent on 05/18/21 ?Clotrim-beta cream twice daily-Pt only uses prn and does not need this time  ?Dicyclomine 20mg-32ms not use right now ?Pravastatin -D/C by provider  ?Indomethacin-Was only a temporary supply ? ?Patient needs refills- Request Sent  ?TresibTyler Aasouch 200u ?Atorvastatin 10 mg  ?Synjardy- 12.5-1,000 mg  ?Protonix 40 mg ?Fetzima. ER 80 mg ?Morphine ER 30 mg ?Trulicity 2mg ?G52mpentin 300 mg  ?Vitamin D2 1250 mcg ? ?Confirmed delivery  date of 05/28/21, advised patient that pharmacy will contact them the morning of delivery. ? ? ?DaniellElray McgregorClinical Pharmacist Assistant  ?[phone]

## 2021-05-19 ENCOUNTER — Ambulatory Visit: Payer: HMO

## 2021-05-20 ENCOUNTER — Other Ambulatory Visit: Payer: Self-pay

## 2021-05-20 DIAGNOSIS — Z1501 Genetic susceptibility to malignant neoplasm of breast: Secondary | ICD-10-CM

## 2021-05-20 DIAGNOSIS — E1121 Type 2 diabetes mellitus with diabetic nephropathy: Secondary | ICD-10-CM

## 2021-05-20 MED ORDER — MORPHINE SULFATE ER 30 MG PO TBCR: 30.0000 mg | EXTENDED_RELEASE_TABLET | Freq: Two times a day (BID) | ORAL | 0 refills | Status: DC

## 2021-05-20 MED ORDER — SYNJARDY 12.5-1000 MG PO TABS: 1.0000 | ORAL_TABLET | Freq: Two times a day (BID) | ORAL | 0 refills | Status: DC

## 2021-05-20 MED ORDER — FETZIMA 80 MG PO CP24: 1.0000 | ORAL_CAPSULE | Freq: Every day | ORAL | 1 refills | Status: DC

## 2021-05-20 MED ORDER — VITAMIN D (ERGOCALCIFEROL) 1.25 MG (50000 UNIT) PO CAPS: ORAL_CAPSULE | ORAL | 1 refills | Status: DC

## 2021-05-20 MED ORDER — TRESIBA FLEXTOUCH 200 UNIT/ML ~~LOC~~ SOPN: 52.0000 [IU] | PEN_INJECTOR | Freq: Every day | SUBCUTANEOUS | 3 refills | Status: DC

## 2021-05-20 MED ORDER — ATORVASTATIN CALCIUM 10 MG PO TABS: 10.0000 mg | ORAL_TABLET | Freq: Every day | ORAL | 0 refills | Status: DC

## 2021-05-20 MED ORDER — TRULICITY 3 MG/0.5ML ~~LOC~~ SOAJ: SUBCUTANEOUS | 2 refills | Status: DC

## 2021-05-20 MED ORDER — PANTOPRAZOLE SODIUM 40 MG PO TBEC: 40.0000 mg | DELAYED_RELEASE_TABLET | Freq: Two times a day (BID) | ORAL | 1 refills | Status: DC

## 2021-05-20 MED ORDER — GABAPENTIN 300 MG PO CAPS: 600.0000 mg | ORAL_CAPSULE | Freq: Three times a day (TID) | ORAL | 0 refills | Status: DC

## 2021-05-20 NOTE — Telephone Encounter (Signed)
Compliant on meds, removed Pravastatin per note from Feb 2023 ?

## 2021-05-21 ENCOUNTER — Other Ambulatory Visit: Payer: Self-pay | Admitting: Family Medicine

## 2021-05-23 NOTE — Progress Notes (Signed)
?  Subjective:  ?Patient ID: Linda [name], female    DOB: 1970/08/01,  MRN: [id number] ? ?Rithika Seel presents to clinic today for preventative diabetic foot care and callus(es) b/l lower extremities and painful thick toenails that are difficult to trim. Painful toenails interfere with ambulation. Aggravating factors include wearing enclosed shoe gear. Pain is relieved with periodic professional debridement. Painful calluses are aggravated when weightbearing with and without shoegear. Pain is relieved with periodic professional debridement. ? ?Patient states blood glucose was 135 mg/dl today .   ? ?Last known HgA1c was 7.0%. ? ?New problem(s): None.  ? ?Patient states she suffered a fall  two days ago and went to get evaluated. ? ?PCP is [name], Elnita Maxwell, MD , and last visit was April 30, 2021. ? ?Allergies  ?Allergen Reactions  ? Codeine Shortness Of Breath  ?  Other reaction(s): SHOB  ? Celecoxib Other (See Comments)  ?  Unknown reaction ?Other reaction(s): Unknown  ? Ezetimibe   ?  Other reaction(s): Unknown  ? Ezetimibe-Simvastatin Other (See Comments)  ?  Unknown reaction  ? Propranolol   ?  Other reaction(s): Unknown  ? Propranolol Hcl Other (See Comments)  ?  Unknown reaction  ? Simvastatin   ?  Other reaction(s): Unknown  ? ? ?Review of Systems: Negative except as noted in the HPI. ? ?Objective: No changes noted in today's physical examination. ? ?Objective:  ? ?Vascular Examination: ?CFT <3 seconds b/l LE. Faintly palpable DP pulses b/l LE. Faintly palpable PT pulse(s) b/l LE. Pedal hair present. No pain with calf compression b/l. Lower extremity skin temperature gradient within normal limits. No edema noted b/l LE. No cyanosis or clubbing noted b/l LE. ? ?Neurological Examination: ?Protective sensation diminished with 10g monofilament b/l. ? ?Dermatological Examination: ?Pedal skin with normal turgor, texture and tone b/l.  No open wounds b/l LE. No interdigital macerations noted b/l LE.  Toenails 1-5 b/l elongated, discolored, dystrophic, thickened, crumbly with subungual debris and tenderness to dorsal palpation. Hyperkeratotic lesion(s) bilateral heels, L hallux, submet head 1 b/l, and submet head 5 right foot.  No erythema, no edema, no drainage, no fluctuance. ? ?Musculoskeletal Examination: ?Muscle strength 5/5 to b/l LE. No pain, crepitus or joint limitation noted with ROM bilateral LE. Hammertoe deformity noted 2-5 b/l. ? ?Radiographs: None ? ?Last A1c:   ? ?  Latest Ref Rng & Units 03/23/2021  ?  8:53 AM 11/16/2020  ? 11:42 AM 07/24/2020  ? 10:57 AM  ?Hemoglobin A1C  ?Hemoglobin-A1c 4.8 - 5.6 % 7.1   8.2   5.8    ?  ?Assessment/Plan: ?1. Pain due to onychomycosis of toenails of both feet   ?2. Callus   ?3. Type 2 diabetes mellitus with hyperglycemia, with long-term current use of insulin ([place])   ?-Patient was evaluated and treated. All patient's and/or POA's questions/concerns answered on today's visit. ?-Continue foot and shoe inspections daily. Monitor blood glucose per PCP/Endocrinologist's recommendations. ?-Mycotic toenails 1-5 bilaterally were debrided in length and girth with sterile nail nippers and dremel without incident. ?-Callus(es) bilateral heels, L hallux, submet head 1 b/l, and submet head 5 right foot pared utilizing sterile scalpel blade without complication or incident. Total number debrided =6. ?-Patient/POA to call should there be question/concern in the interim.  ? ?Return in about 3 months (around 08/12/2021). ? ?Marzetta Board, DPM  ?

## 2021-05-27 LAB — HM DIABETES EYE EXAM

## 2021-06-09 ENCOUNTER — Telehealth: Payer: Self-pay

## 2021-06-09 NOTE — Chronic Care Management (AMB) (Signed)
Chronic Care Management Pharmacy Assistant   Name: Linda [name]  MRN: [id number] DOB: 08/22/1970  Reason for Encounter: Disease State/ Diabetes  Recent office visits:  None  Recent consult visits:  None  Hospital visits:  None in previous 6 months  Medications: Outpatient Encounter Medications as of 06/09/2021  Medication Sig Note   atorvastatin (LIPITOR) 10 MG tablet Take 1 tablet (10 mg total) by mouth daily.    Blood Glucose Monitoring Suppl (ONETOUCH VERIO REFLECT) w/Device KIT AS DIRECTED    buPROPion (WELLBUTRIN XL) 300 MG 24 hr tablet Take 1 tablet (300 mg total) by mouth every evening.    busPIRone (BUSPAR) 5 MG tablet Take 1 tablet (5 mg total) by mouth 3 (three) times daily.    calcium citrate-vitamin D (CITRACAL+D) 315-200 MG-UNIT tablet Take 1 tablet by mouth 2 (two) times daily.    clotrimazole-betamethasone (LOTRISONE) cream Apply twice daily as needed to area of concern    Continuous Blood Gluc Receiver (FREESTYLE LIBRE 2 READER) DEVI E11.69 Check blood sugar 4 times daily as directed    Continuous Blood Gluc Sensor (FREESTYLE LIBRE 2 SENSOR) MISC E11.69 Change sensor every 14 days as directed    cyclobenzaprine (FLEXERIL) 10 MG tablet TAKE ONE TABLET BY MOUTH EVERY 8 HOURS AS NEEDED FOR muscle SPASMS    dicyclomine (BENTYL) 20 MG tablet TAKE ONE TABLET BY MOUTH BEFORE MEALS AND AT BEDTIME AS NEEDED FOR STOMACH CRAMPING    Dulaglutide (TRULICITY) 3 FB/5.1WC SOPN inject 86m AS DIRECTED ONCE A WEEK    Empagliflozin-metFORMIN HCl (SYNJARDY) 12.05-998 MG TABS Take 1 tablet by mouth 2 (two) times daily.    famotidine (PEPCID) 20 MG tablet Take 1 tablet (20 mg total) by mouth 2 (two) times daily.    ferrous sulfate 325 (65 FE) MG tablet Take 325 mg by mouth every evening.    gabapentin (NEURONTIN) 300 MG capsule Take 2 capsules (600 mg total) by mouth 3 (three) times daily.    indomethacin (INDOCIN) 50 MG capsule Take 1 capsule (50 mg total) by mouth 3 (three)  times daily with meals. (Patient not taking: Reported on 05/20/2021)    insulin degludec (TRESIBA FLEXTOUCH) 200 UNIT/ML FlexTouch Pen Inject 52 Units into the skin daily.    Levomilnacipran HCl ER (FETZIMA) 80 MG CP24 Take 1 capsule by mouth daily.    levothyroxine (SYNTHROID) 75 MCG tablet Take 1 tablet (75 mcg total) by mouth daily.    lisdexamfetamine (VYVANSE) 50 MG capsule Take 1 capsule (50 mg total) by mouth daily.    loratadine (CLARITIN) 10 MG tablet Take 10 mg by mouth daily. Taking 3-4 days each week due to bone pain associated with shot from cancer center.    losartan (COZAAR) 50 MG tablet Take 1 tablet (50 mg total) by mouth every evening.    Magnesium 500 MG CAPS 500 mg.    morphine (MS CONTIN) 30 MG 12 hr tablet Take 1 tablet (30 mg total) by mouth every 12 (twelve) hours.    Multiple Vitamin (MULTIVITAMIN WITH MINERALS) TABS tablet Take 1 tablet by mouth daily.  06/08/2018: Pt plans on purchasing again when she can get out.   omega-3 acid ethyl esters (LOVAZA) 1 g capsule Take 2 capsules (2 g total) by mouth 2 (two) times daily.    ondansetron (ZOFRAN) 4 MG tablet Take 1 tablet (4 mg total) by mouth every 4 (four) hours as needed for nausea.    OneTouch Delica Lancets 358NMISC 1 each by Does  not apply route daily before breakfast. Check blood sugar twice daily.    pantoprazole (PROTONIX) 40 MG tablet Take 1 tablet (40 mg total) by mouth 2 (two) times daily.    potassium chloride (MICRO-K) 10 MEQ CR capsule Take 2 capsules (20 mEq total) by mouth 2 (two) times daily.    prochlorperazine (COMPAZINE) 10 MG tablet TAKE ONE TABLET BY MOUTH EVERY 6 HOURS AS NEEDED FOR NAUSEA AND VOMITING    rOPINIRole (REQUIP) 0.25 MG tablet Take 1 tablet (0.25 mg total) by mouth at bedtime.    SODIUM FLUORIDE 5000 SENSITIVE 1.1-5 % GEL Take by mouth 2 (two) times daily.    Specialty Vitamins Products (MAGNESIUM, AMINO ACID CHELATE,) 133 MG tablet Take 1 tablet by mouth at bedtime.    Vitamin D,  Ergocalciferol, (DRISDOL) 1.25 MG (50000 UNIT) CAPS capsule Take one capsule by mouth weekly    zolpidem (AMBIEN) 10 MG tablet One before bed    Facility-Administered Encounter Medications as of 06/09/2021  Medication   sodium chloride flush (NS) 0.9 % injection 10 mL  Recent Relevant Labs: Lab Results  Component Value Date/Time   HGBA1C 7.1 (H) 03/23/2021 08:53 AM   HGBA1C 8.2 (H) 11/16/2020 11:42 AM   MICROALBUR 30 10/09/2019 11:56 AM   MICROALBUR 80 07/08/2019 09:51 AM    Kidney Function Lab Results  Component Value Date/Time   CREATININE 1.06 (H) 03/23/2021 08:53 AM   CREATININE 1.03 (H) 11/16/2020 11:42 AM   CREATININE 0.88 11/27/2019 11:47 AM   GFRNONAA >60 11/27/2019 11:47 AM   GFRAA 73 10/09/2019 09:47 AM     Current antihyperglycemic regimen:  Trulicity 3 mg weekly Tresiba 52 units daily Synjardy 12.5-1,000 mg twice daily   Patient verbally confirms she is taking the above medications as directed. Yes  What recent interventions/DTPs have been made to improve glycemic control:  None  Have there been any recent hospitalizations or ED visits since last visit with CPP? No  Patient denies hypoglycemic symptoms  Patient reports hyperglycemic symptoms, including polyuria  How often are you checking your blood sugar? Patient uses continuous glucose monitor  What are your blood sugars ranging?  Fasting: 06-09-2021 251, 259, 271, 196, 200, 198 Before meals: None After meals: None Bedtime: None  On insulin? Yes How many units: 52  During the week, how often does your blood glucose drop below 70? Never  Are you checking your feet daily/regularly? Yes  NOTES: Patient stated it is difficult eating healthy due to food being expensive and being disable. Patient normally gets food from food banks which aren't healthy. Patient asked about our previous CPP and was informed that she is no longer with [name] and [place] [facility] replaced her. Scheduled a telephone visit with CPP  for 06-10-2021.  Adherence Review: Is the patient currently on a STATIN medication? Yes Is the patient currently on ACE/ARB medication? Yes Does the patient have >5 day gap between last estimated fill dates? No  Care Gaps: Last eye exam / Retinopathy Screening? Patient stated 05-27-2021 Last Annual Wellness Visit?  Last Diabetic Foot Exam? 02-10-2021 Tdap overdue Shingrix overdue Yearly ophthalmology overdue  Star Rating Drugs: Trulicity 3 mg- Last filled 05-20-2021 90 DS Atorvastatin 10 mg- Last filled 05-20-2021 90 DS Losartan 50 mg- Last filled 05-20-2021 90 DS Synjardy- 12.5-1,000 mg- Last filled 05-20-2021 90 DS  [place] Clinical Pharmacist Assistant [phone]

## 2021-06-10 ENCOUNTER — Ambulatory Visit (INDEPENDENT_AMBULATORY_CARE_PROVIDER_SITE_OTHER): Payer: HMO

## 2021-06-10 DIAGNOSIS — E782 Mixed hyperlipidemia: Secondary | ICD-10-CM

## 2021-06-10 DIAGNOSIS — E1121 Type 2 diabetes mellitus with diabetic nephropathy: Secondary | ICD-10-CM
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
## 2021-06-10 NOTE — Patient Instructions (Signed)
Visit Information   Goals Addressed   None    Patient Care Plan: CCM Pharmacy Care Plan     Problem Identified: diabetes, hyperlipidemia, hypertension   Priority: High  Onset Date: 03/05/2020     Long-Range Goal: Disease Management   Start Date: 03/05/2020  Expected End Date: 03/05/2021  Recent Progress: On track  Priority: High  Note:   Current Barriers:  Unable to maintain control of blood sugar while on steroids/chemotherapy.    Pharmacist Clinical Goal(s):  Over the next 90 days, patient will achieve control of diabetes as evidenced by blood sugar and a1c through collaboration with PharmD and provider.   Interventions: 1:1 collaboration with [name], Elnita Maxwell, MD regarding development and update of comprehensive plan of care as evidenced by provider attestation and co-signature Inter-disciplinary care team collaboration (see longitudinal plan of care) Comprehensive medication review performed; medication list updated in electronic medical record  Hypertension (BP goal <130/80) BP Readings from Last 3 Encounters:  04/30/21 120/78  04/19/21 140/79  03/23/21 118/70  -controlled -Current treatment: losartan 50 mg daily Appropriate, Effective, Safe, Accessible -Medications previously tried: none reported  -Current home readings: well controlled when checked at home or office -Current dietary habits: eating what family prepares while on chemotherapy -Current exercise habits: chemotherapy limits exercise and energy -Denies hypotensive/hypertensive symptoms -Educated on BP goals and benefits of medications for prevention of heart attack, stroke and kidney damage; Daily salt intake goal < 2300 mg; -Counseled to monitor BP at home as needed, document, and provide log at future appointments -Counseled on diet and exercise extensively Recommended to continue current medication  Hyperlipidemia: (LDL goal < 70) The 10-year ASCVD risk score (Arnett DK, et al., 2019) is: 2.8%    Values used to calculate the score:     Age: 51 years     Sex: Female     Is Non-Hispanic African American: No     Diabetic: Yes     Tobacco smoker: No     Systolic Blood Pressure: 629 mmHg     Is BP treated: Yes     HDL Cholesterol: 50 mg/dL     Total Cholesterol: 157 mg/dL Lab Results  Component Value Date   CHOL 157 03/23/2021   CHOL 227 (H) 11/16/2020   CHOL 165 07/24/2020   Lab Results  Component Value Date   HDL 50 03/23/2021   HDL 46 11/16/2020   HDL 57 07/24/2020   Lab Results  Component Value Date   LDLCALC 78 03/23/2021   [place] 130 (H) 11/16/2020   LDLCALC 86 07/24/2020   Lab Results  Component Value Date   TRIG 170 (H) 03/23/2021   TRIG 284 (H) 11/16/2020   TRIG 126 07/24/2020   Lab Results  Component Value Date   CHOLHDL 3.1 03/23/2021   CHOLHDL 4.9 (H) 11/16/2020   CHOLHDL 2.9 07/24/2020  No results found for: LDLDIRECT -uncontrolled  -Current treatment: Atorvastatin 53m Appropriate, Effective, Safe, Accessible -Medications previously tried: Pravastatin -Current dietary patterns: eating what family provides during chemotherapy -Current exercise habits: limited due to chemo -Educated on Cholesterol goals;  Importance of limiting foods high in cholesterol; Exercise goal of 150 minutes per week; -Counseled on diet and exercise extensively  Diabetes (A1c goal <7%) Lab Results  Component Value Date   HGBA1C 7.1 (H) 03/23/2021   HGBA1C 8.2 (H) 11/16/2020   HGBA1C 5.8 (H) 07/24/2020   Lab Results  Component Value Date   MICROALBUR 30 10/09/2019   LDLCALC 78 03/23/2021   CREATININE 1.06 (  H) 03/23/2021   Lab Results  Component Value Date   NA 142 03/23/2021   K 4.4 03/23/2021   CREATININE 1.06 (H) 03/23/2021   EGFR 63 03/23/2021   GFRNONAA >60 11/27/2019   GLUCOSE 134 (H) 03/23/2021   Lab Results  Component Value Date   WBC 6.3 03/23/2021   HGB 13.0 03/23/2021   HCT 40.9 03/23/2021   MCV 86 03/23/2021   PLT 99 (LL) 03/23/2021   -uncontrolled -Current medications: Trulicity 3.15 mg weekly Appropriate, Query effective, Safe, Accessible Empagliflozin/Metformin 12.5/1000mg BID Appropriate, Query effective, Safe, Accessible Tresiba 52 units Appropriate, Query effective, Safe, Accessible [employer] (4x/day) Appropriate, Query effective, Safe, Accessible -Medications previously tried: none reported  -Current home glucose readings fasting glucose:  May 2023: 06-09-2021 251, 259, 271, 196, 200, 198 post prandial glucose: -Reports hypoglycemic symptoms -Current meal patterns:  Patient has food insecurity, unable to get food unless it's at a food bank. Even there, you can't choose much.  -Current exercise: limited due to chemotherapy -Educated onA1c and blood sugar goals; Exercise goal of 150 minutes per week; Benefits of routine self-monitoring of blood sugar; -Counseled to check feet daily and get yearly eye exams -Counseled on diet and exercise extensively  May 2023:  -Patient has food insecurity, will ask PCP if we can do referral for social worker -Patient took DM class many years ago but she has difficulty making healthy meals out of what the food banks give her. She'd like to meet with a dietician/take a class. Will ask for referral -Patient used to qualify for Medicaid, but when her father passed and left her some money, she didn't qualify anymore. She believes she is close to qualifying again. She will call Medicaid as soon as she hangs up with me and try to apply/find out when she qualifies -Patient states she has issues with binge eating. She will wake up in the middle of the night and binge eat. Spent extensive time counseling that the key to DM isn't "not eating" but it's eating consistently. Her "homework" for the next month will be to try to eat consistently and not skip meals. She is unable to focus on healthy food due to cost, so we'll focus on consistency until the social worker can help  out  Depression/Anxiety (Goal: manage symptoms) -uncontrolled -Current treatment: Wellbutrin XL 300 mg daily Fetzima 80 mg  Lorazepam 0.5 mg bid prn anxiety -Medications previously tried/failed: none reported -PHQ9:     03/23/2021    8:36 AM 01/20/2021    7:59 AM 11/16/2020   10:49 AM  Depression screen PHQ 2/9  Decreased Interest 0 0 2  Down, Depressed, Hopeless 0 1 1  PHQ - 2 Score 0 1 3  Altered sleeping 1 0 1  Tired, decreased energy 1 1 0  Change in appetite 1 0 3  Feeling bad or failure about yourself  0 1 1  Trouble concentrating 0 1 2  Moving slowly or fidgety/restless 0 0 3  Suicidal thoughts 0 0 0  PHQ-9 Score 3 4 13   Difficult doing work/chores Not difficult at all Not difficult at all Somewhat difficult  -GAD7:     07/21/2020   11:40 AM 03/09/2020    9:52 AM  GAD 7 : Generalized Anxiety Score  Nervous, Anxious, on Edge 2 3  Control/stop worrying 2 3  Worry too much - different things 2 3  Trouble relaxing 2 3  Restless 2 2  Easily annoyed or irritable 1 1  Afraid -  awful might happen 2 1  Total GAD 7 Score 13 16   -Educated on Benefits of cognitive-behavioral therapy with or without medication Recommend continue current therapy   Patient Goals/Self-Care Activities Over the next 90 days, patient will:  - take medications as prescribed check glucose three times daily , document, and provide at future appointments  Follow Up Plan: Telephone follow up appointment with care management team member scheduled for: June 2023  [place] Constable, Pharm.D. - [phone]      Ms. Dusek was given information about Chronic Care Management services today including:  CCM service includes personalized support from designated clinical staff supervised by her physician, including individualized plan of care and coordination with other care providers 24/7 contact phone numbers for assistance for urgent and routine care needs. Standard insurance, coinsurance, copays  and deductibles apply for chronic care management only during months in which we provide at least 20 minutes of these services. Most insurances cover these services at 100%, however patients may be responsible for any copay, coinsurance and/or deductible if applicable. This service may help you avoid the need for more expensive face-to-face services. Only one practitioner may furnish and bill the service in a calendar month. The patient may stop CCM services at any time (effective at the end of the month) by phone call to the office staff.  Patient agreed to services and verbal consent obtained.   The patient verbalized understanding of instructions, educational materials, and care plan provided today and DECLINED offer to receive copy of patient instructions, educational materials, and care plan.  The pharmacy team will reach out to the patient again over the next 30 days.   Lane Hacker, [place]

## 2021-06-14 ENCOUNTER — Other Ambulatory Visit: Payer: Self-pay | Admitting: Family Medicine

## 2021-06-14 DIAGNOSIS — F5081 Binge eating disorder: Secondary | ICD-10-CM

## 2021-06-16 ENCOUNTER — Telehealth: Payer: Self-pay

## 2021-06-16 NOTE — Progress Notes (Unsigned)
Chronic Care Management Pharmacy Assistant   Name: Thelia Tanksley  MRN: [id number] DOB: Oct 08, 1970   Reason for Encounter: Medication Coordination for [facility]   Recent office visits:  None  Recent consult visits:  None  Hospital visits:  None  Medications: Outpatient Encounter Medications as of 06/16/2021  Medication Sig Note   atorvastatin (LIPITOR) 10 MG tablet Take 1 tablet (10 mg total) by mouth daily.    Blood Glucose Monitoring Suppl (ONETOUCH VERIO REFLECT) w/Device KIT AS DIRECTED    buPROPion (WELLBUTRIN XL) 300 MG 24 hr tablet Take 1 tablet (300 mg total) by mouth every evening.    busPIRone (BUSPAR) 5 MG tablet Take 1 tablet (5 mg total) by mouth 3 (three) times daily.    calcium citrate-vitamin D (CITRACAL+D) 315-200 MG-UNIT tablet Take 1 tablet by mouth 2 (two) times daily.    clotrimazole-betamethasone (LOTRISONE) cream Apply twice daily as needed to area of concern    Continuous Blood Gluc Receiver (FREESTYLE LIBRE 2 READER) DEVI E11.69 Check blood sugar 4 times daily as directed    Continuous Blood Gluc Sensor (FREESTYLE LIBRE 2 SENSOR) MISC E11.69 Change sensor every 14 days as directed    cyclobenzaprine (FLEXERIL) 10 MG tablet TAKE ONE TABLET BY MOUTH EVERY 8 HOURS AS NEEDED FOR muscle SPASMS    dicyclomine (BENTYL) 20 MG tablet TAKE ONE TABLET BY MOUTH BEFORE MEALS AND AT BEDTIME AS NEEDED FOR STOMACH CRAMPING    Dulaglutide (TRULICITY) 3 FU/9.3AT SOPN inject 64m AS DIRECTED ONCE A WEEK    Empagliflozin-metFORMIN HCl (SYNJARDY) 12.05-998 MG TABS Take 1 tablet by mouth 2 (two) times daily.    famotidine (PEPCID) 20 MG tablet Take 1 tablet (20 mg total) by mouth 2 (two) times daily.    ferrous sulfate 325 (65 FE) MG tablet Take 325 mg by mouth every evening.    gabapentin (NEURONTIN) 300 MG capsule Take 2 capsules (600 mg total) by mouth 3 (three) times daily.    indomethacin (INDOCIN) 50 MG capsule Take 1 capsule (50 mg total) by mouth 3 (three) times  daily with meals. (Patient not taking: Reported on 05/20/2021)    insulin degludec (TRESIBA FLEXTOUCH) 200 UNIT/ML FlexTouch Pen Inject 52 Units into the skin daily.    Levomilnacipran HCl ER (FETZIMA) 80 MG CP24 Take 1 capsule by mouth daily.    levothyroxine (SYNTHROID) 75 MCG tablet Take 1 tablet (75 mcg total) by mouth daily.    loratadine (CLARITIN) 10 MG tablet Take 10 mg by mouth daily. Taking 3-4 days each week due to bone pain associated with shot from cancer center.    losartan (COZAAR) 50 MG tablet Take 1 tablet (50 mg total) by mouth every evening.    Magnesium 500 MG CAPS 500 mg.    morphine (MS CONTIN) 30 MG 12 hr tablet Take 1 tablet (30 mg total) by mouth every 12 (twelve) hours.    Multiple Vitamin (MULTIVITAMIN WITH MINERALS) TABS tablet Take 1 tablet by mouth daily.  06/08/2018: Pt plans on purchasing again when she can get out.   omega-3 acid ethyl esters (LOVAZA) 1 g capsule Take 2 capsules (2 g total) by mouth 2 (two) times daily.    ondansetron (ZOFRAN) 4 MG tablet Take 1 tablet (4 mg total) by mouth every 4 (four) hours as needed for nausea.    OneTouch Delica Lancets 355DMISC 1 each by Does not apply route daily before breakfast. Check blood sugar twice daily.    pantoprazole (PROTONIX) 40 MG  tablet Take 1 tablet (40 mg total) by mouth 2 (two) times daily.    potassium chloride (MICRO-K) 10 MEQ CR capsule Take 2 capsules (20 mEq total) by mouth 2 (two) times daily.    prochlorperazine (COMPAZINE) 10 MG tablet TAKE ONE TABLET BY MOUTH EVERY 6 HOURS AS NEEDED FOR NAUSEA AND VOMITING    rOPINIRole (REQUIP) 0.25 MG tablet Take 1 tablet (0.25 mg total) by mouth at bedtime.    SODIUM FLUORIDE 5000 SENSITIVE 1.1-5 % GEL Take by mouth 2 (two) times daily.    Specialty Vitamins Products (MAGNESIUM, AMINO ACID CHELATE,) 133 MG tablet Take 1 tablet by mouth at bedtime.    Vitamin D, Ergocalciferol, (DRISDOL) 1.25 MG (50000 UNIT) CAPS capsule Take one capsule by mouth weekly    VYVANSE  50 MG capsule TAKE ONE CAPSULE BY MOUTH ONCE DAILY    zolpidem (AMBIEN) 10 MG tablet One before bed    Facility-Administered Encounter Medications as of 06/16/2021  Medication   sodium chloride flush (NS) 0.9 % injection 10 mL    Reviewed chart for medication changes ahead of medication coordination call.  No OVs, Consults, or hospital visits since last care coordination call/Pharmacist visit.   No medication changes indicated OR if recent visit, treatment plan here.  BP Readings from Last 3 Encounters:  04/30/21 120/78  04/19/21 140/79  03/23/21 118/70    Lab Results  Component Value Date   HGBA1C 7.1 (H) 03/23/2021     Patient obtains medications through Vials  30 Days   Last adherence delivery included:  Buspirone 5 mg- 1 tab 3 times daily  Morphine ER 30 mg-  1 capsule every 12 hours Ropinirole -0.25 mg- 1 tablet daily (bedtime) Zolpidem- 10 mg- 1 tablet daily (bedtime) Potassium CL ER 10 meq- 2 tablets twice daily Losartan Potassium 50 mg- 1 tablet every evening  Famotidine 20 mg- 1 tablet twice daily Bupropion XL 300 mg -1 tablet daily (bedtime) Protonix 40 mg- 1 tablet before breakfast and before evening meal Levothyroxine 75 mcg- 1 tablet daily Fetzima. ER 80 mg- 1 capsule daily Gabapentin 300 mg -2 capsule 3 times daily Zofran 230m Take 1 tablet (4 mg total) by mouth every 4 (four) hours as needed     Synjardy- 12.5-1,000 mg - 1 tablet twice daily Lovaza 1gm-2 capsules twice daily Atorvastatin 10 mg 1 tab once daily  Freestyle kit 2 Sensor- Use as directed and change every 14 days.  Vitamin D2 1250 mcg- once every Friday. Cyclobenzaprine 10 mg- 1 tab every 8 hours prn  Tresiba Flextouch 200u- Inject 52 units daily  Trulicity 3130m Inject 30m64mnce a week.  Prochlorperazine 61m19make 1 tablet every 6 hours prn  Patient declined (meds) last month  Vyvanse 50mg65mily This will be sent on 05/18/21 Clotrim-beta cream twice daily-Pt only uses prn and does not need  this time  Dicyclomine 20mg-61ms not use right now Pravastatin -D/C by provider  Indomethacin-Was only a temporary supply  Patient is due for next adherence delivery on: 06/29/21. Called patient and reviewed medications and coordinated delivery.  This delivery to include: Buspirone 5 mg- 1 tab 3 times daily  Morphine ER 30 mg-  1 capsule every 12 hours Ropinirole -0.25 mg- 1 tablet daily (bedtime) Zolpidem- 10 mg- 1 tablet daily (bedtime) Potassium CL ER 10 meq- 2 tablets twice daily Losartan Potassium 50 mg- 1 tablet every evening  Famotidine 20 mg- 1 tablet twice daily Bupropion XL 300 mg -1 tablet daily (bedtime) Protonix  40 mg- 1 tablet before breakfast and before evening meal Levothyroxine 75 mcg- 1 tablet daily Fetzima. ER 80 mg- 1 capsule daily Gabapentin 300 mg -2 capsule 3 times daily Zofran 106m Take 1 tablet (4 mg total) by mouth every 4 (four) hours as needed     Synjardy- 12.5-1,000 mg - 1 tablet twice daily Lovaza 1gm-2 capsules twice daily Atorvastatin 10 mg 1 tab once daily  Freestyle kit 2 Sensor- Use as directed and change every 14 days.  Vitamin D2 1250 mcg- once every Friday. Cyclobenzaprine 10 mg- 1 tab every 8 hours prn  Tresiba Flextouch 200u- Inject 52 units daily  Trulicity 3830m Inject 30m14mnce a week.  Prochlorperazine 47m35make 1 tablet every 6 hours prn  Patient declined the following medications (meds) due to (reason) Clotrim-beta cream twice daily-Pt only uses prn and does not need this time  Dicyclomine 20mg42mes not use right now Indomethacin-Was only a temporary supply Vyvanse 50mg-75mly This will be sent on 06/17/21  Patient needs refills -Request Sent  Morphine ER 30 mg Buspirone 5 mg Gabapentin 300 mg   Confirmed delivery date of 06/29/21, advised patient that pharmacy will contact them the morning of delivery.  DanielElray McgregorClRanchesteracist Assistant  [phone]

## 2021-06-17 ENCOUNTER — Other Ambulatory Visit: Payer: Self-pay

## 2021-06-17 DIAGNOSIS — Z1501 Genetic susceptibility to malignant neoplasm of breast: Secondary | ICD-10-CM

## 2021-06-17 MED ORDER — BUSPIRONE HCL 5 MG PO TABS: 5.0000 mg | ORAL_TABLET | Freq: Three times a day (TID) | ORAL | 2 refills | Status: DC

## 2021-06-17 MED ORDER — GABAPENTIN 300 MG PO CAPS: 600.0000 mg | ORAL_CAPSULE | Freq: Three times a day (TID) | ORAL | 0 refills | Status: DC

## 2021-06-17 MED ORDER — MORPHINE SULFATE ER 30 MG PO TBCR: 30.0000 mg | EXTENDED_RELEASE_TABLET | Freq: Two times a day (BID) | ORAL | 0 refills | Status: DC

## 2021-06-17 NOTE — Progress Notes (Signed)
Subjective:  Patient ID: Linda [name], female    DOB: Jan 07, 1971  Age: 51 y.o. MRN: [id number]  Chief Complaint  Patient presents with   Diabetes   Hyperlipidemia   Hypertension   HPI: Diabetes:  Complications: HYPERTENSION, CKD Glucose checking: CGM Glucose logs: 200s Hypoglycemia: no  Most recent A1C: 8.2 Current medications: Trulicity 3 mg injection once weekly, Synjardy 12.5 mg/ 1000 mg 1 tablet twice daily, Tresiba inject 52 units daily. Last Eye Exam: 05/29/21 Foot checks: Daily  Hyperlipidemia: Current medications: Taking pravastatin 20 mg daily (Has Atorvastatin 10 mg 1 tablet daily), Just ran out of Vascepa 1 gm 2 capsules twice daily. Was denied by insurance.   Hypertensive kidney disease with Stage 3B chronic kidney disease: Complications: DM Current medications: Losartan potassium 50 mg take 1 tablet daily, Potassium Chloride 10 meq take 2 capsules BID  Acquired Hypothyroidism: Patient is currently taking Levothyroxine 75 mg daily.  Chronic Pain Syndrome: Chronic back pain fairly well controlled. 5/10. On Morphine 30 mg take 1 tablet BID, Gabapentin 300 mg take 2 capsules TID.  Binge eating disorder: Patient is currently taking Vyvanse 50 mg 1 capsule daily. She is binging at night. She binges 3 times a night.   Vitamin D deficiency: Patient is currently taking Vitamin D 50,000 units 1 capsule weekly.  GERD: Patient is currently taking Pantoprazole 40 mg 1 tablet BID, Famotidine 20 mg 1 tablet BID.  Depression, mild, recurrent: Patient is currently taking Fetzima 80 mg daily, Bupropion 300 mg 1 tablet daily, Buspirone 5 mg take 1 tablet three times a day. Gets depressed intermittently. Medications working well.   HPI has been reviewed and updated.  Current Outpatient Medications on File Prior to Visit  Medication Sig Dispense Refill   atorvastatin (LIPITOR) 10 MG tablet Take 1 tablet (10 mg total) by mouth daily. 90 tablet 0   Blood Glucose Monitoring  Suppl (ONETOUCH VERIO REFLECT) w/Device KIT AS DIRECTED     buPROPion (WELLBUTRIN XL) 300 MG 24 hr tablet Take 1 tablet (300 mg total) by mouth every evening. 90 tablet 1   busPIRone (BUSPAR) 5 MG tablet Take 1 tablet (5 mg total) by mouth 3 (three) times daily. 90 tablet 2   calcium citrate-vitamin D (CITRACAL+D) 315-200 MG-UNIT tablet Take 1 tablet by mouth 2 (two) times daily.     clotrimazole-betamethasone (LOTRISONE) cream Apply twice daily as needed to area of concern 45 g 2   Continuous Blood Gluc Receiver (FREESTYLE LIBRE 2 READER) DEVI E11.69 Check blood sugar 4 times daily as directed 1 each 0   Continuous Blood Gluc Sensor (FREESTYLE LIBRE 2 SENSOR) MISC E11.69 Change sensor every 14 days as directed 6 each 3   cyclobenzaprine (FLEXERIL) 10 MG tablet TAKE ONE TABLET BY MOUTH EVERY 8 HOURS AS NEEDED FOR muscle SPASMS 30 tablet 3   dicyclomine (BENTYL) 20 MG tablet TAKE ONE TABLET BY MOUTH BEFORE MEALS AND AT BEDTIME AS NEEDED FOR STOMACH CRAMPING 180 tablet 1   Empagliflozin-metFORMIN HCl (SYNJARDY) 12.05-998 MG TABS Take 1 tablet by mouth 2 (two) times daily. 180 tablet 0   famotidine (PEPCID) 20 MG tablet Take 1 tablet (20 mg total) by mouth 2 (two) times daily. 180 tablet 1   ferrous sulfate 325 (65 FE) MG tablet Take 325 mg by mouth every evening.     gabapentin (NEURONTIN) 300 MG capsule Take 2 capsules (600 mg total) by mouth 3 (three) times daily. 180 capsule 0   indomethacin (INDOCIN) 50 MG capsule Take  1 capsule (50 mg total) by mouth 3 (three) times daily with meals. (Patient not taking: Reported on 05/20/2021) 21 capsule 0   insulin degludec (TRESIBA FLEXTOUCH) 200 UNIT/ML FlexTouch Pen Inject 52 Units into the skin daily. 9 mL 3   Levomilnacipran HCl ER (FETZIMA) 80 MG CP24 Take 1 capsule by mouth daily. 90 capsule 1   levothyroxine (SYNTHROID) 75 MCG tablet Take 1 tablet (75 mcg total) by mouth daily. 90 tablet 1   loratadine (CLARITIN) 10 MG tablet Take 10 mg by mouth daily.  Taking 3-4 days each week due to bone pain associated with shot from cancer center.     losartan (COZAAR) 50 MG tablet Take 1 tablet (50 mg total) by mouth every evening. 90 tablet 1   Magnesium 500 MG CAPS 500 mg.     morphine (MS CONTIN) 30 MG 12 hr tablet Take 1 tablet (30 mg total) by mouth every 12 (twelve) hours. 60 tablet 0   Multiple Vitamin (MULTIVITAMIN WITH MINERALS) TABS tablet Take 1 tablet by mouth daily.      omega-3 acid ethyl esters (LOVAZA) 1 g capsule Take 2 capsules (2 g total) by mouth 2 (two) times daily. 360 capsule 1   ondansetron (ZOFRAN) 4 MG tablet Take 1 tablet (4 mg total) by mouth every 4 (four) hours as needed for nausea. 90 tablet 3   OneTouch Delica Lancets 54M MISC 1 each by Does not apply route daily before breakfast. Check blood sugar twice daily. 100 each 3   pantoprazole (PROTONIX) 40 MG tablet Take 1 tablet (40 mg total) by mouth 2 (two) times daily. 180 tablet 1   potassium chloride (MICRO-K) 10 MEQ CR capsule Take 2 capsules (20 mEq total) by mouth 2 (two) times daily. 360 capsule 0   prochlorperazine (COMPAZINE) 10 MG tablet TAKE ONE TABLET BY MOUTH EVERY 6 HOURS AS NEEDED FOR NAUSEA AND VOMITING 90 tablet 0   rOPINIRole (REQUIP) 0.25 MG tablet Take 1 tablet (0.25 mg total) by mouth at bedtime. 90 tablet 1   SODIUM FLUORIDE 5000 SENSITIVE 1.1-5 % GEL Take by mouth 2 (two) times daily.     Specialty Vitamins Products (MAGNESIUM, AMINO ACID CHELATE,) 133 MG tablet Take 1 tablet by mouth at bedtime.     Vitamin D, Ergocalciferol, (DRISDOL) 1.25 MG (50000 UNIT) CAPS capsule Take one capsule by mouth weekly 12 capsule 1   zolpidem (AMBIEN) 10 MG tablet One before bed 30 tablet 5   Current Facility-Administered Medications on File Prior to Visit  Medication Dose Route Frequency Provider Last Rate Last Admin   sodium chloride flush (NS) 0.9 % injection 10 mL  10 mL Intracatheter PRN Mosher, Kelli A, PA-C   10 mL at 02/19/21 1004   Past Medical History:   Diagnosis Date   Acute postoperative respiratory insufficiency 04/17/2020   AKI (acute kidney injury) ([place]) 04/17/2020   Anemia    Anemia, unspecified    Anemia, unspecified    Anemia, unspecified    Anxiety    Arthritis    BRCA gene mutation positive    Chronic pain syndrome    Depression    Diabetes mellitus without complication ([place])    type 2   Diabetic ulcer of toe of left foot associated with type 2 diabetes mellitus, with fat layer exposed ([place]) 03/27/2020   Gastritis    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 No longer has   Past Surgical History:  Procedure Laterality Date   APPENDECTOMY     BACK SURGERY     x 3 lower disc   BILATERAL TOTAL MASTECTOMY WITH AXILLARY LYMPH NODE DISSECTION Bilateral 09/2019   CHOLECYSTECTOMY     nephrolithiasis     ROBOTIC ASSISTED TOTAL HYSTERECTOMY WITH BILATERAL SALPINGO OOPHERECTOMY Bilateral 06/14/2016   Procedure: ROBOTIC ASSISTED TOTAL HYSTERECTOMY WITH BILATERAL SALPINGO OOPHORECTOMY;  Surgeon: Nancy Marus, MD;  Location: [facility];  Service: Gynecology;  Laterality: Bilateral;    Family History  Problem Relation Age of Onset   Cancer Mother        breast   CAD Father    Diabetes Father    Heart failure Father    Cancer Father        renal carcinoma   Kidney failure Father    Cancer Brother 59       renal carcinoma.   Stroke Paternal Grandmother    Social History   Socioeconomic History   Marital status: Divorced    Spouse name: Not on  file   Number of children: Not on file   Years of education: Not on file   Highest education level: Not on file  Occupational History   Occupation: disabled  Tobacco Use   Smoking status: Never   Smokeless tobacco: Never  Substance and Sexual Activity   Alcohol use: No   Drug use: No   Sexual activity: Yes  Other Topics Concern   Not on file  Social History Narrative   wears sunscreen, brushes and flosses daily, see's dentist bi-annually, has smoke/carbon monoxide detectors, wears a seatbelt and practices gun safety   Social Determinants of Health   Financial Resource Strain: High Risk   Difficulty of Paying Living Expenses: Hard  Food Insecurity: Not on file  Transportation Needs: No Transportation Needs   Lack of Transportation (Medical): No   Lack of Transportation (Non-Medical): No  Physical Activity: Not on file  Stress: Not on file  Social Connections: Not on file    Review of Systems  Constitutional:  Negative for appetite change, fatigue and fever.  HENT:  Negative for congestion, ear pain, sinus pressure and sore throat.   Respiratory:  Negative for cough, chest tightness, shortness of breath and wheezing.   Cardiovascular:  Negative for chest pain and palpitations.  Gastrointestinal:  Negative for abdominal pain, constipation, diarrhea, nausea and vomiting.  Genitourinary:  Negative for dysuria and hematuria.  Musculoskeletal:  Positive for arthralgias (Bl Hip pain) and back pain. Negative for joint swelling and myalgias.  Skin:  Negative for rash.  Neurological:  Positive for weakness. Negative for dizziness and headaches.  Psychiatric/Behavioral:  Positive for dysphoric mood. The patient is not nervous/anxious.     Objective:  BP 128/68   Pulse 94   Temp (!) 97.5 F (36.4 C)   Ht 5' 2"  (1.575 m)   Wt 204 lb (92.5 kg)   LMP 06/13/2009   SpO2 97%   BMI 37.31 kg/m      06/25/2021  10:05 AM 06/23/2021    7:38 AM 04/30/2021   10:23 AM  BP/Weight   Systolic BP 518 841 660  Diastolic BP 84 68 78  Wt. (Lbs) 202.9 204 200  BMI 37.11 kg/m2 37.31 kg/m2 36.58 kg/m2    Physical Exam Vitals reviewed.  Constitutional:      Appearance: Normal appearance. She is normal weight.  Neck:     Vascular: No carotid bruit.  Cardiovascular:     Rate and Rhythm: Normal rate and regular rhythm.     Pulses: Normal pulses.     Heart sounds: Normal heart sounds.  Pulmonary:     Effort: Pulmonary effort is normal.     Breath sounds: Normal breath sounds.  Abdominal:     General: Abdomen is flat. Bowel sounds are normal.     Palpations: Abdomen is soft.  Musculoskeletal:        General: Tenderness (lumbar) present. Normal range of motion.  Neurological:     Mental Status: She is alert and oriented to person, place, and time.  Psychiatric:        Mood and Affect: Mood normal.        Behavior: Behavior normal.    Diabetic Foot Exam - Simple   Simple Foot Form  06/23/2021  9:44 PM  Visual Inspection See comments: Yes Sensation Testing Intact to touch and monofilament testing bilaterally: Yes Pulse Check Posterior Tibialis and Dorsalis pulse intact bilaterally: Yes Comments Dry skin. Calluses.      Lab Results  Component Value Date   WBC 4.5 06/25/2021   HGB 11.6 (A) 06/25/2021   HCT 37 06/25/2021   PLT 74 (A) 06/25/2021   GLUCOSE 145 (H) 06/23/2021   CHOL 145 06/23/2021   TRIG 150 (H) 06/23/2021   HDL 50 06/23/2021   LDLCALC 69 06/23/2021   ALT 30 06/25/2021   AST 30 06/25/2021   NA 141 06/25/2021   K 4.4 06/25/2021   CL 102 06/25/2021   CREATININE 0.8 06/25/2021   BUN 16 06/25/2021   CO2 30 (A) 06/25/2021   TSH 1.700 06/23/2021   HGBA1C 7.6 (H) 06/23/2021   MICROALBUR 30 10/09/2019      Assessment & Plan:   Problem List Items Addressed This Visit       Cardiovascular and Mediastinum   Hypertension complicating diabetes ([place])    Well controlled.  No changes to medicines.  Continue to work on eating a healthy  diet and exercise.  Labs drawn today.          Relevant Medications   Dulaglutide (TRULICITY) 4.5 YT/0.1SW SOPN   Other Relevant Orders   CBC With Diff/Platelet (Completed)   Comprehensive metabolic panel (Completed)     Digestive   Liver cirrhosis secondary to NASH (nonalcoholic steatohepatitis) (HCC) (Chronic)    stable         Endocrine   Acquired hypothyroidism    The current medical regimen is effective;  continue present plan and medications.        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 Recommend continue CGM. Recommend check feet daily. Recommend annual eye exams. Medicines: Recommend increase Trulicity 4.5 mg injection once weekly, Synjardy 12.5 mg/ 1000 mg 1 tablet twice daily, Tresiba inject 52 units daily.Continue to work on eating a healthy diet and exercise.  Labs drawn today.       Relevant Medications   Dulaglutide (TRULICITY) 4.5 FU/9.3AT SOPN  Other Relevant Orders   Hemoglobin A1c (Completed)   Type 2 diabetes mellitus with hyperglycemia (HCC)    Control: not at goal. Recommend continue CGM. Recommend check feet daily. Recommend annual eye exams. Medicines: Recommend increase Trulicity 4.5 mg injection once weekly, Synjardy 12.5 mg/ 1000 mg 1 tablet twice daily, Tresiba inject 52 units daily.Continue to work on eating a healthy diet and exercise.  Labs drawn today.          Relevant Medications   Dulaglutide (TRULICITY) 4.5 AJ/2.8NO SOPN     Hematopoietic and Hemostatic   Idiopathic thrombocytopenic purpura (ITP) (HCC) (Chronic)    stable         Other   BRCA1 positive (Chronic)    Management per specialist.         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         Mixed hyperlipidemia    Well controlled.  No changes to medicines.  Continue to work on eating a  healthy diet and exercise.  Labs drawn today.        Relevant Orders   Lipid panel (Completed)   Vitamin D insufficiency    The current medical regimen is effective;  continue present plan and medications.        Chronic pain syndrome    The current medical regimen is effective;  continue present plan and medications.        Depression, major, recurrent, mild ([place])    The current medical regimen is effective;  continue present plan and medications.  Continue taking Fetzima 80 mg daily, Bupropion 300 mg 1 tablet daily, Buspirone 5 mg take 1 tablet three times a day.        Uncomplicated opioid dependence (HCC)    Chronic pain due to back pain. The current medical regimen is effective;  continue present plan and medications.        Binge eating disorder    Increase Vyvanse to 70 mg daily.        Relevant Medications   lisdexamfetamine (VYVANSE) 70 MG capsule   Falls frequently    Refer to physical therapy       Relevant Orders   Ambulatory referral to Physical Therapy   Weakness of both lower extremities    Referral to physical therapy       Relevant Orders   Ambulatory referral to Physical Therapy   Right hip pain    Order x-ray       Relevant Orders   DG Hip Unilat W OR W/O Pelvis 2-3 Views Right   Ambulatory referral to Physical Therapy   Left hip pain    Order x-ray       Relevant Orders   DG Hip Unilat W OR W/O Pelvis 2-3 Views Left   Ambulatory referral to Physical Therapy  .  Meds ordered this encounter  Medications   lisdexamfetamine (VYVANSE) 70 MG capsule    Sig: Take 1 capsule (70 mg total) by mouth daily.    Dispense:  30 capsule    Refill:  0   Dulaglutide (TRULICITY) 4.5 MV/6.7MC SOPN    Sig: Inject 4.5 mg as directed once a week.    Dispense:  6 mL    Refill:  0    Orders Placed This Encounter  Procedures   DG Hip Unilat W OR W/O Pelvis 2-3 Views Left   DG Hip Unilat W OR  W/O Pelvis 2-3 Views Right   CBC With  Diff/Platelet   Comprehensive metabolic panel   Lipid panel   Hemoglobin A1c   TSH   Ambulatory referral to Physical Therapy     Follow-up: No follow-ups on file.  An After Visit Summary was printed and given to the patient.  I,Lauren M Auman,acting as a scribe for [name], MD.,have documented all relevant documentation on the behalf of [name], MD,as directed by  [name], MD while in the presence of [name], MD.    [name], MD [place] [phone]

## 2021-06-17 NOTE — Telephone Encounter (Signed)
Compliant on meds. Changed patient to packs, went over change with her on the phone to get times

## 2021-06-21 ENCOUNTER — Other Ambulatory Visit: Payer: Self-pay | Admitting: Family Medicine

## 2021-06-21 DIAGNOSIS — Z794 Long term (current) use of insulin: Secondary | ICD-10-CM

## 2021-06-22 ENCOUNTER — Other Ambulatory Visit: Payer: HMO

## 2021-06-22 ENCOUNTER — Ambulatory Visit: Payer: HMO | Admitting: Oncology

## 2021-06-22 ENCOUNTER — Telehealth: Payer: Self-pay | Admitting: *Deleted

## 2021-06-22 NOTE — Chronic Care Management (AMB) (Unsigned)
  Chronic Care Management   Outreach Note  06/22/2021 Name: Georgine Wiltse MRN: [id number] DOB: [birth year]  Roanne Haye is a 51 y.o. year old female who is a primary care patient of [name], Kirsten, MD. I reached out to [name] by phone today in response to a referral sent by Ms. Margrett Kalb Fallert's primary care provider.  An unsuccessful telephone outreach was attempted today. The patient was referred to the case management team for assistance with care management and care coordination.   Follow Up Plan: A HIPAA compliant phone message was left for the patient providing contact information and requesting a return call.  The care management team will reach out to the patient again over the next 3-5 days.  If patient returns call to provider office, please advise to call [place]* at [facility]  Direct Dial: [phone]

## 2021-06-23 ENCOUNTER — Ambulatory Visit (INDEPENDENT_AMBULATORY_CARE_PROVIDER_SITE_OTHER): Payer: HMO | Admitting: Family Medicine

## 2021-06-23 ENCOUNTER — Ambulatory Visit (INDEPENDENT_AMBULATORY_CARE_PROVIDER_SITE_OTHER): Payer: HMO

## 2021-06-23 VITALS — BP 128/68 | HR 94 | Temp 97.5°F | Ht 62.0 in | Wt 204.0 lb

## 2021-06-23 DIAGNOSIS — C50311 Malignant neoplasm of lower-inner quadrant of right female breast: Secondary | ICD-10-CM

## 2021-06-23 DIAGNOSIS — E1159 Type 2 diabetes mellitus with other circulatory complications: Secondary | ICD-10-CM | POA: Diagnosis not present

## 2021-06-23 DIAGNOSIS — F419 Anxiety disorder, unspecified: Secondary | ICD-10-CM

## 2021-06-23 DIAGNOSIS — E11621 Type 2 diabetes mellitus with foot ulcer: Secondary | ICD-10-CM | POA: Diagnosis not present

## 2021-06-23 DIAGNOSIS — F330 Major depressive disorder, recurrent, mild: Secondary | ICD-10-CM

## 2021-06-23 DIAGNOSIS — I152 Hypertension secondary to endocrine disorders: Secondary | ICD-10-CM | POA: Diagnosis not present

## 2021-06-23 DIAGNOSIS — E559 Vitamin D deficiency, unspecified: Secondary | ICD-10-CM

## 2021-06-23 DIAGNOSIS — R29898 Other symptoms and signs involving the musculoskeletal system: Secondary | ICD-10-CM | POA: Insufficient documentation

## 2021-06-23 DIAGNOSIS — F1120 Opioid dependence, uncomplicated: Secondary | ICD-10-CM

## 2021-06-23 DIAGNOSIS — L97522 Non-pressure chronic ulcer of other part of left foot with fat layer exposed: Secondary | ICD-10-CM | POA: Diagnosis not present

## 2021-06-23 DIAGNOSIS — D693 Immune thrombocytopenic purpura: Secondary | ICD-10-CM | POA: Diagnosis not present

## 2021-06-23 DIAGNOSIS — K7581 Nonalcoholic steatohepatitis (NASH): Secondary | ICD-10-CM

## 2021-06-23 DIAGNOSIS — E1165 Type 2 diabetes mellitus with hyperglycemia: Secondary | ICD-10-CM | POA: Diagnosis not present

## 2021-06-23 DIAGNOSIS — E782 Mixed hyperlipidemia: Secondary | ICD-10-CM

## 2021-06-23 DIAGNOSIS — E1121 Type 2 diabetes mellitus with diabetic nephropathy: Secondary | ICD-10-CM | POA: Diagnosis not present

## 2021-06-23 DIAGNOSIS — Z794 Long term (current) use of insulin: Secondary | ICD-10-CM | POA: Diagnosis not present

## 2021-06-23 DIAGNOSIS — E1169 Type 2 diabetes mellitus with other specified complication: Secondary | ICD-10-CM

## 2021-06-23 DIAGNOSIS — E785 Hyperlipidemia, unspecified: Secondary | ICD-10-CM

## 2021-06-23 DIAGNOSIS — E039 Hypothyroidism, unspecified: Secondary | ICD-10-CM | POA: Diagnosis not present

## 2021-06-23 DIAGNOSIS — Z1501 Genetic susceptibility to malignant neoplasm of breast: Secondary | ICD-10-CM | POA: Diagnosis not present

## 2021-06-23 DIAGNOSIS — F32A Depression, unspecified: Secondary | ICD-10-CM

## 2021-06-23 DIAGNOSIS — F5081 Binge eating disorder: Secondary | ICD-10-CM

## 2021-06-23 DIAGNOSIS — G894 Chronic pain syndrome: Secondary | ICD-10-CM

## 2021-06-23 DIAGNOSIS — M25552 Pain in left hip: Secondary | ICD-10-CM

## 2021-06-23 DIAGNOSIS — M25551 Pain in right hip: Secondary | ICD-10-CM

## 2021-06-23 DIAGNOSIS — K7460 Unspecified cirrhosis of liver: Secondary | ICD-10-CM

## 2021-06-23 DIAGNOSIS — Z171 Estrogen receptor negative status [ER-]: Secondary | ICD-10-CM

## 2021-06-23 DIAGNOSIS — R296 Repeated falls: Secondary | ICD-10-CM

## 2021-06-23 DIAGNOSIS — Z1509 Genetic susceptibility to other malignant neoplasm: Secondary | ICD-10-CM

## 2021-06-23 MED ORDER — TRULICITY 4.5 MG/0.5ML ~~LOC~~ SOAJ: 4.5000 mg | SUBCUTANEOUS | 0 refills | Status: DC

## 2021-06-23 MED ORDER — LISDEXAMFETAMINE DIMESYLATE 70 MG PO CAPS: 70.0000 mg | ORAL_CAPSULE | Freq: Every day | ORAL | 0 refills | Status: DC

## 2021-06-23 NOTE — Chronic Care Management (AMB) (Unsigned)
  Chronic Care Management   Outreach Note  06/23/2021 Name: [name] MRN: [id number] DOB: 1970/03/27  Linda [name] is a 51 y.o. year old female who is a primary care patient of [name], Kirsten, MD. I reached out to [name] by phone today in response to a referral sent by Ms. Maekayla Giorgio Umeda's primary care provider.  A second unsuccessful telephone outreach was attempted today. The patient was referred to the case management team for assistance with care management and care coordination.   Follow Up Plan: A HIPAA compliant phone message was left for the patient providing contact information and requesting a return call.  The care management team will reach out to the patient again over the next 7 days.  If patient returns call to provider office, please advise to call [place]* at [phone].*  [facility]  Direct Dial: [phone]

## 2021-06-23 NOTE — Assessment & Plan Note (Signed)
Thrombocytopenia, which is felt to be due to chronic ITPand liver cirrhosis.  Her last platelet count was 96,000 in October, so in good range.  She continues to have regular blood work at Dr. [employer] office.  CBC in February revealed stable mild thrombocytopenia with a platelet count of 99,000.

## 2021-06-23 NOTE — Assessment & Plan Note (Signed)
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 She was treated with bilateral mastectomy due to BRCA1 mutation. She completed 4 cycles of Adriamycin/cyclophosphamide chemotherapy and received 8 out of 12 planned weeks of weekly paclitaxel.  She remains without evidence of recurrence.

## 2021-06-23 NOTE — Progress Notes (Signed)
SITUATION Reason for Visit: Fitting of Diabetic Shoes & Insoles Patient / Caregiver Report:  Patient is satisfied with fit and function of shoes and insoles.  OBJECTIVE DATA: Patient History / Diagnosis:     ICD-10-CM   1. Type 2 diabetes mellitus with hyperglycemia, with long-term current use of insulin (HCC)  E11.65    Z79.4     2. Diabetic ulcer of toe of left foot associated with type 2 diabetes mellitus, with fat layer exposed ([place])  E11.621    L97.522       Change in Status:   None  ACTIONS PERFORMED: In-Person Delivery, patient was fit with: - 1x pair [id number] PDAC approved prefabricated Diabetic Shoes: Ortho[employer] Breeze Black 835 24M - 3x pair [id number] PDAC approved vacuum formed custom diabetic insoles; [employer]: [id number]  Shoes and insoles were verified for structural integrity and safety. Patient wore shoes and insoles in office. Skin was inspected and free of areas of concern after wearing shoes and inserts. Shoes and inserts fit properly. Patient / Caregiver provided with ferbal instruction and demonstration regarding donning, doffing, wear, care, proper fit, function, purpose, cleaning, and use of shoes and insoles ' and in all related precautions and risks and benefits regarding shoes and insoles. Patient / Caregiver was instructed to wear properly fitting socks with shoes at all times. Patient was also provided with verbal instruction regarding how to report any failures or malfunctions of shoes or inserts, and necessary follow up care. Patient / Caregiver was also instructed to contact physician regarding change in status that may affect function of shoes and inserts.   Patient / Caregiver verbalized undersatnding of instruction provided. Patient / Caregiver demonstrated independence with proper donning and doffing of shoes and inserts.  PLAN Patient to follow with treating physician as recommended. Plan of care was discussed with and agreed upon by patient and/or caregiver. All  questions were answered and concerns addressed.

## 2021-06-23 NOTE — Patient Instructions (Addendum)
Binge eating disorder: Increase Vyvanse to 70 mg daily.   Hip pain bilaterally: Ordered hip x-rays.  Go to [facility] Refer to physical therapy for frequent falls, leg weakness, and bilateral hip pain.    Diabetes: Recommend increase Trulicity to 4.5 mg weekly.  Continue other medications as prescribed.  Referral made to dietitian.  Depression: Referral made to social work. Recommend call [place] counseling to set up an appointment

## 2021-06-23 NOTE — Assessment & Plan Note (Addendum)
Positive BRCA 1 mutation, which increases her risk for breast and ovarian cancer, as well as elevates her risk for pancreatic cancer.  She underwent hysterectomy/bilateral salpingo-oophorectomy.  The hepatologist recommended biannual screening for hepatocellular carcinoma with ultrasound alternating with cross-sectional imaging to allow screening for pancreatic cancer.  Right upper quadrant ultrasound in February revealed nodular hepatic contour with coarse hepatic echogenicity consistent with known cirrhosis.  No focal hepatic abnormality was identified. We will plan CT abdomen in August.

## 2021-06-23 NOTE — Progress Notes (Signed)
[facility]  [address] 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 She was treated with bilateral mastectomy due to BRCA1 mutation.  She completed 4 cycles of Adriamycin/cyclophosphamide chemotherapy and received 8 out of 12 planned weeks of weekly paclitaxel.  She remains without evidence of recurrence.  BRCA1 positive Positive BRCA 1 mutation, which increases her risk for breast and ovarian cancer, as well as elevates her risk for pancreatic cancer.  She underwent hysterectomy/bilateral salpingo-oophorectomy.  The hepatologist recommended biannual screening for hepatocellular carcinoma with ultrasound alternating with cross-sectional imaging to allow screening for pancreatic cancer.  Right upper quadrant ultrasound in February revealed nodular hepatic contour with coarse hepatic echogenicity consistent with known cirrhosis.  No focal hepatic abnormality was identified. We will plan CT abdomen in August.  Liver cirrhosis secondary to NASH (nonalcoholic steatohepatitis) ([place]) Liver cirrhosis as seen on CT imaging in August 2020.  She was referred to [facility], [place], at the [facility] in [place].  Hepatitis panel was negative.  She received the appropriate vaccines.  CT imaging in May 2022 was stable.  [name] recommended every 6 month screening for hepatocellular cancer with ultrasound alternating with cross-sectional imaging to allow for screening for pancreatic cancer.  Right upper quadrant ultrasound in February did not reveal any focal hepatic abnormality.  We will plan CT imaging in August  to screen for hepatocellular due to cirrhosis, which will allow for pancreatic cancer screening due to her BRCA1 mutation.  Idiopathic thrombocytopenic purpura (ITP) (HCC) Thrombocytopenia, which is felt to be due to chronic ITP and liver cirrhosis.  Her last platelet count was 96,000 in October, so in good range.  She continues to have regular blood work at [name] office.  CBC in February revealed stable mild thrombocytopenia with a platelet count of 99,000.  Her platelet count today is back down to 74,000, but still in the range it has been.  The thrombocytopenia could also be due to her liver disease.   The patient understands the plans discussed today and is in agreement with them.  She knows to contact our office if she develops concerns prior to her next appointment.   I provided 20 minutes of face-to-face time during this encounter and > 50% was spent counseling as documented under my assessment and plan.    Linda Pickles, PA-C  [facility] AT [facility] [address] [place] [address] Dept: [phone] Dept Fax: [phone]   Orders Placed This Encounter  Procedures   CT ABDOMEN W WO CONTRAST    Standing Status:   Future    Standing Expiration Date:   06/26/2022    Order Specific Question:   If indicated for the ordered procedure, I authorize the administration of contrast media per Radiology protocol    Answer:   Yes    Order Specific Question:   Is patient pregnant?    Answer:   No    Order Specific Question:   Preferred imaging location?    Answer:   External    Comments:   [facility]    Order Specific Question:   Is  Oral Contrast requested for this exam?    Answer:   Yes, Per Radiology protocol   CBC with Differential ([facility] Only)    Standing Status:   Future    Standing Expiration Date:   06/26/2022   CMP ([facility] only)    Standing Status:   Future    Standing Expiration Date:   06/26/2022   CBC and  differential    This external order was created through the Results Console.   CBC    This external order was created through the Results Console.   Basic metabolic panel    This external order was created through the Results Console.   Comprehensive metabolic panel    This external order was created through the Results Console.   Hepatic function panel    This external order was created through the Results Console.   CBC    This order was created through External Result Entry      CHIEF COMPLAINT:  CC: Stage IB triple negative breast cancer  Current Treatment: Observation  HISTORY OF PRESENT ILLNESS:  Linda [name] is a 51 Y.O. female who we began seeing in February 2018 for evaluation of anemia.  Her anemia was felt to be secondary to iron deficiency and we recommended continuation of oral iron supplementation for a total of 6 months, as well as referral back to the gastroenterologist.  The patient also has a history of thrombocytopenia and had previously seen Dr. Bobby [name] in 2014.  The thrombocytopenia was felt to be secondary to mild chronic immune thrombocytopenic purpura (ITP).  Due to the patient's family history of malignancy, she underwent testing for hereditary cancer syndromes with the My[employer] myRisk Hereditary Cancer Panel test.  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  She underwent a robotic hysterectomy and bilateral salpingo-oophorectomy in May 2018 with Dr. Everitt [name].  Pathology was benign.  We also discussed the option of risk reducing bilateral mastectomy, but she chose to have close surveillance, so we recommended annual MRI breast, in addition to annual mammography.  She was placed on chemoprevention with raloxifene in September 2018.  CT abdomen and pelvis in August 2020 done for bilateral flank pain, revealed a tiny left renal  calculus, as well as probable hepatic cirrhosis without evidence of hepatic mass.  She was then referred to [facility] and is followed by Linda [name], CRNP in [place] for her liver cirrhosis.  She tested negative for hepatitis A, B, and C.  She received her hepatitis vaccines in 2020.   We saw her in September 2021 for a new diagnosis of stage IB (T1c N0 M0) triple negative right breast cancer.  She underwent screening bilateral mammogram on August 3rd which revealed possible masses in the right breast.  Diagnostic right mammogram and right breast ultrasound from August 18th confirmed suspicious masses in the right breast at 5 o'clock measuring 1.5 cm and 6 o'clock measuring 1.4 cm.  There was an indeterminate 4 mm with possible distortion in the outer right breast without sonographic correlate.  Right axillary ultrasound was normal.  She then underwent biopsies of both masses and surgical pathology from these procedures revealed  invasive ductal carcinoma, grade 3, with necrosis at 5 o'clock; and invasive ductal carcinoma, grade 1-2, with necrosis and focal myxoid change at 6 o'clock.  No DCIS was identified.  Breast  prognostic profiles revealed HER2, and estrogen and progesterone receptors to be negative. Ki67 was 30% at the 5 o'clock mass, and 40% at 6 o'clock.  She underwent bilateral mastectomies on September 24th with Dr. Noberto Retort. Surgical pathology from this procedure revealed invasive ductal carcinoma, grade 3, with myxoid change, 19 mm, at 6 o'clock, and invasive ductal carcinoma, grade 3, and ductal carcinoma in situ, 19 mm, at 5 o'clock.  All margins were negative for invasive carcinoma or DCIS.  Two sentinel lymph nodes were negative for metastatic carcinoma (0/2). CT chest, abdomen and pelvis in September did not reveal any evidence of metastatic disease.  She started dose dense AC chemotherapy on October 25th , with plans to follow this with weekly paclitaxel for 12  doses.  She had severe restless leg syndrome with oral corticosteroids.  She had significantly worsened anemia with a hemoglobin of 8.7, as well as pancytopenia, so the 4th cycle of chemotherapy was delayed, but she finally got her last dose on January 4th.  Iron studies, B12 and folate were normal. Her anemia has slowly improved.  She started weekly paclitaxel on January 26th and has received 8 out of 12 planned doses.   She did have some expected pancytopenia.  She has pre-existing neuropathy of the feet and legs.     She was admitted to the hospital for osteomyelitis of the left 2nd toe in March 2022.  She was treated with empiric vancomycin and Zosyn.  Amputation was recommended but the patient refused.  Cultures returned with Klebsiella, sensitive to IV Rocephin, so long term antibiotics 2 g Q24H will be continued for the next 6 weeks. At that time, we decided to discontinue weekly paclitaxel, as she had received 8/12 cycles and had significant neuropathy.  She has done fairly well since that time.  She has mild chronic thrombocytopenia felt to be chronic ITP, although, this could be secondary to her liver disease..  She has routine laboratories every 3 months at Dr. Alyse Low office.  She has remained on observation.  She went without screening for hepatocellular cancer from May 2022 to February 2023 at which time a liver ultrasound did not reveal any liver masses.  [name] recommended alternating ultrasound with cross-sectional imaging to allow for pancreatic cancer screening due to her BRCA1 mutation.  INTERVAL HISTORY:  Linda [name] is here today for repeat clinical assessment.  She states she has been doing fairly well.  She denies any changes in the chest wall.  She denies any nausea, vomiting, diarrhea, constipation or abdominal pain.  Her only complaint today is bilateral hip pain and leg weakness.  She states Dr. Tobie Poet has ordered hip x-rays and she plans to have those done today.  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  She has had previous back surgery.  She denies fevers or chills. She denies pain. Her appetite is good. Her weight has decreased 2 pounds over last 4 months .  REVIEW OF SYSTEMS:  Review of Systems  Constitutional:  Negative for appetite change, chills, fatigue, fever and unexpected weight change.  HENT:   Negative for lump/mass, mouth sores and sore throat.   Respiratory:  Negative for cough and shortness of breath.   Cardiovascular:  Negative for chest pain and leg swelling.  Gastrointestinal:  Negative for abdominal pain, constipation, diarrhea, nausea and vomiting.  Endocrine: Negative for hot flashes.  Genitourinary:  Negative for difficulty urinating, dysuria, frequency and hematuria.   Musculoskeletal:  Negative for arthralgias, back pain and myalgias.  Skin:  Negative for rash.  Neurological:  Negative for dizziness and headaches.  Hematological:  Negative for adenopathy. Does not bruise/bleed easily.  Psychiatric/Behavioral:  Negative for depression and sleep disturbance. The patient is not nervous/anxious.     VITALS:  Blood pressure 132/84, pulse 91, temperature 97.6 F (36.4 C), temperature source Oral, resp. rate 18, height 5' 2"  (1.575 m), weight 202 lb 14.4 oz (92 kg), last menstrual period 06/13/2009, SpO2 96 %.  Wt Readings from Last 3 Encounters:  06/25/21 202 lb 14.4 oz (92 kg)  06/23/21 204 lb (92.5 kg)  04/30/21 200 lb (90.7 kg)    Body mass index is 37.11 kg/m.  Performance status (ECOG): 1 - Symptomatic but completely ambulatory  PHYSICAL EXAM:  Physical Exam Vitals and nursing note reviewed.  Constitutional:      General: She is not in acute distress.    Appearance: Normal appearance.  HENT:     Head: Normocephalic and atraumatic.     Mouth/Throat:     Mouth: Mucous membranes are moist.     Pharynx: Oropharynx is clear. No oropharyngeal exudate or posterior oropharyngeal erythema.   Eyes:     General: No scleral icterus.    Extraocular Movements: Extraocular movements intact.     Conjunctiva/sclera: Conjunctivae normal.     Pupils: Pupils are equal, round, and reactive to light.  Cardiovascular:     Rate and Rhythm: Normal rate and regular rhythm.     Heart sounds: Normal heart sounds. No murmur heard.   No friction rub. No gallop.  Pulmonary:     Effort: Pulmonary effort is normal.     Breath sounds: Normal breath sounds. No wheezing, rhonchi or rales.  Chest:  Breasts:    Right: Absent.     Left: Absent.     Comments: Bilateral mastectomy sites are negative. Abdominal:     General: There is no distension.     Palpations: Abdomen is soft. There is hepatomegaly and splenomegaly. There is no mass.     Tenderness: There is no abdominal tenderness.  Musculoskeletal:        General: Normal range of motion.     Cervical back: Normal range of motion and neck supple. No tenderness.     Right lower leg: No edema.     Left lower leg: No edema.  Lymphadenopathy:     Cervical: No cervical adenopathy.     Upper Body:     Right upper body: No supraclavicular or axillary adenopathy.     Left upper body: No supraclavicular or axillary adenopathy.     Lower Body: No right inguinal adenopathy. No left inguinal adenopathy.  Skin:    General: Skin is warm and dry.     Coloration: Skin is not jaundiced.     Findings: No rash.  Neurological:     Mental Status: She is alert and oriented to person, place, and time.     Cranial Nerves: No cranial nerve deficit.  Psychiatric:        Mood and Affect: Mood normal.        Behavior: Behavior normal.        Thought Content: Thought content normal.    LABS:      Latest Ref Rng & Units 06/25/2021   12:00 AM 06/23/2021    8:41 AM 03/23/2021    8:53 AM  CBC  WBC  4.5      6.5   6.3  Hemoglobin 12.0 - 16.0 11.6      12.8   13.0    Hematocrit 36 - 46 37      40.5   40.9    Platelets 150 - 400 K/uL 74      87   99        This result is from an external source.      Latest Ref Rng & Units 06/25/2021   12:00 AM 06/23/2021    8:41 AM 03/23/2021    8:53 AM  CMP  Glucose 70 - 99 mg/dL  145   134    BUN 4 - 21 16      12   13     Creatinine 0.5 - 1.1 0.8      1.09   1.06    Sodium 137 - 147 141      142   142    Potassium 3.5 - 5.1 mEq/L 4.4      4.2   4.4    Chloride 99 - 108 102      102   102    CO2 13 - 22 30      23   24     Calcium 8.7 - 10.7 9.1      9.3   9.4    Total Protein 6.0 - 8.5 g/dL  6.7   6.6    Total Bilirubin 0.0 - 1.2 mg/dL  0.6   0.5    Alkaline Phos 25 - 125 82      97   97    AST 13 - 35 30      32   33    ALT 7 - 35 U/L 30      29   31        This result is from an external source.     No results found for: CEA1 / No results found for: CEA1 No results found for: PSA1 No results found for: QZR007 No results found for: CAN125  No results found for: TOTALPROTELP, ALBUMINELP, A1GS, A2GS, BETS, BETA2SER, GAMS, MSPIKE, SPEI Lab Results  Component Value Date   TIBC 312 05/20/2020   TIBC 300 01/13/2020   FERRITIN 98 05/20/2020   IRONPCTSAT 26 05/20/2020   IRONPCTSAT 24.3 01/13/2020   No results found for: LDH  STUDIES:  No results found.    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 No longer has    Past Surgical History:  Procedure Laterality Date   APPENDECTOMY     BACK SURGERY     x 3 lower disc   BILATERAL TOTAL MASTECTOMY WITH AXILLARY LYMPH NODE DISSECTION Bilateral 09/2019   CHOLECYSTECTOMY     nephrolithiasis     ROBOTIC ASSISTED TOTAL HYSTERECTOMY WITH BILATERAL SALPINGO OOPHERECTOMY Bilateral 06/14/2016   Procedure: ROBOTIC ASSISTED TOTAL HYSTERECTOMY WITH BILATERAL SALPINGO OOPHORECTOMY;  Surgeon: Nancy Marus, MD;  Location: [facility];  Service: Gynecology;  Laterality: Bilateral;    Family History  Problem Relation Age of Onset   Cancer Mother        breast   CAD Father    Diabetes Father    Heart failure Father    Cancer Father        renal carcinoma   Kidney failure Father    Cancer Brother 48       renal carcinoma.   Stroke Paternal Grandmother     Social History:  reports that she has never smoked. She has never used smokeless tobacco. She reports that she does not drink alcohol and does not use drugs.The patient is alone today.  Allergies:  Allergies  Allergen Reactions   Codeine Shortness Of Breath    Other reaction(s): SHOB   Celecoxib Other (See Comments)    Unknown reaction Other reaction(s): Unknown   Ezetimibe     Other reaction(s): Unknown   Ezetimibe-Simvastatin Other (See Comments)    Unknown reaction   Propranolol     Other reaction(s): Unknown   Propranolol Hcl Other (See Comments)    Unknown reaction   Simvastatin     Other reaction(s): Unknown    Current Medications: Current Outpatient Medications  Medication Sig Dispense Refill   atorvastatin (LIPITOR) 10 MG tablet Take 1 tablet (10 mg total) by mouth  daily. 90 tablet 0   Blood Glucose Monitoring Suppl (ONETOUCH VERIO REFLECT) w/Device KIT AS DIRECTED     buPROPion (WELLBUTRIN XL) 300 MG 24 hr tablet Take 1 tablet (300 mg total) by mouth every evening. 90 tablet 1   busPIRone (BUSPAR) 5 MG tablet Take 1 tablet (5 mg total) by mouth 3 (three) times daily. 90 tablet 2   calcium citrate-vitamin D (CITRACAL+D) 315-200 MG-UNIT tablet Take 1 tablet by mouth 2 (two) times daily.     clotrimazole-betamethasone (LOTRISONE) cream Apply twice daily as needed to area of concern 45 g 2   Continuous Blood Gluc Receiver (FREESTYLE LIBRE 2 READER) DEVI E11.69 Check blood sugar 4 times daily as directed 1 each 0   Continuous Blood Gluc Sensor (FREESTYLE LIBRE 2 SENSOR) MISC E11.69 Change sensor every 14 days as directed 6 each 3   cyclobenzaprine (FLEXERIL) 10 MG tablet TAKE ONE TABLET BY MOUTH EVERY 8 HOURS AS NEEDED FOR muscle SPASMS 30 tablet 3   dicyclomine (BENTYL) 20 MG tablet TAKE ONE TABLET BY MOUTH BEFORE MEALS AND AT BEDTIME AS NEEDED FOR STOMACH CRAMPING 180 tablet 1   Dulaglutide (TRULICITY) 4.5 FF/6.3WG SOPN Inject 4.5 mg as directed once a week. 6 mL 0   Empagliflozin-metFORMIN HCl (SYNJARDY) 12.05-998 MG TABS Take 1 tablet by mouth 2 (two) times daily. 180 tablet 0   famotidine (PEPCID) 20 MG tablet Take 1 tablet (20 mg total) by mouth 2 (two) times daily. 180 tablet 1   ferrous sulfate 325 (65 FE) MG tablet Take 325 mg by mouth  every evening.     gabapentin (NEURONTIN) 300 MG capsule Take 2 capsules (600 mg total) by mouth 3 (three) times daily. 180 capsule 0   indomethacin (INDOCIN) 50 MG capsule Take 1 capsule (50 mg total) by mouth 3 (three) times daily with meals. (Patient not taking: Reported on 05/20/2021) 21 capsule 0   insulin degludec (TRESIBA FLEXTOUCH) 200 UNIT/ML FlexTouch Pen Inject 52 Units into the skin daily. 9 mL 3   Levomilnacipran HCl ER (FETZIMA) 80 MG CP24 Take 1 capsule by mouth daily. 90 capsule 1   levothyroxine (SYNTHROID)  75 MCG tablet Take 1 tablet (75 mcg total) by mouth daily. 90 tablet 1   lisdexamfetamine (VYVANSE) 70 MG capsule Take 1 capsule (70 mg total) by mouth daily. 30 capsule 0   loratadine (CLARITIN) 10 MG tablet Take 10 mg by mouth daily. Taking 3-4 days each week due to bone pain associated with shot from cancer center.     losartan (COZAAR) 50 MG tablet Take 1 tablet (50 mg total) by mouth every evening. 90 tablet 1   Magnesium 500 MG CAPS 500 mg.     morphine (MS CONTIN) 30 MG 12 hr tablet Take 1 tablet (30 mg total) by mouth every 12 (twelve) hours. 60 tablet 0   Multiple Vitamin (MULTIVITAMIN WITH MINERALS) TABS tablet Take 1 tablet by mouth daily.      omega-3 acid ethyl esters (LOVAZA) 1 g capsule Take 2 capsules (2 g total) by mouth 2 (two) times daily. 360 capsule 1   ondansetron (ZOFRAN) 4 MG tablet Take 1 tablet (4 mg total) by mouth every 4 (four) hours as needed for nausea. 90 tablet 3   OneTouch Delica Lancets 57X MISC 1 each by Does not apply route daily before breakfast. Check blood sugar twice daily. 100 each 3   pantoprazole (PROTONIX) 40 MG tablet Take 1 tablet (40 mg total) by mouth 2 (two) times daily. 180 tablet 1   potassium chloride (MICRO-K) 10 MEQ CR capsule Take 2 capsules (20 mEq total) by mouth 2 (two) times daily. 360 capsule 0   prochlorperazine (COMPAZINE) 10 MG tablet TAKE ONE TABLET BY MOUTH EVERY 6 HOURS AS NEEDED FOR NAUSEA AND VOMITING 90 tablet 0   rOPINIRole (REQUIP) 0.25 MG tablet Take 1 tablet (0.25 mg total) by mouth at bedtime. 90 tablet 1   SODIUM FLUORIDE 5000 SENSITIVE 1.1-5 % GEL Take by mouth 2 (two) times daily.     Specialty Vitamins Products (MAGNESIUM, AMINO ACID CHELATE,) 133 MG tablet Take 1 tablet by mouth at bedtime.     Vitamin D, Ergocalciferol, (DRISDOL) 1.25 MG (50000 UNIT) CAPS capsule Take one capsule by mouth weekly 12 capsule 1   zolpidem (AMBIEN) 10 MG tablet One before bed 30 tablet 5   No current facility-administered medications for  this visit.   Facility-Administered Medications Ordered in Other Visits  Medication Dose Route Frequency Provider Last Rate Last Admin   sodium chloride flush (NS) 0.9 % injection 10 mL  10 mL Intracatheter PRN Pride Gonzales A, PA-C   10 mL at 02/19/21 1004

## 2021-06-23 NOTE — Assessment & Plan Note (Signed)
Liver cirrhosis as seen on CT imaging in August 2020. She was referred to [facility], [place], at the [facility] in [place]. Hepatitis panel was negative.  She received the appropriate vaccines.CT imaging in May 2022 was stable.  [name] recommended every 6 month screening for hepatocellular cancer with ultrasound alternating with cross-sectional imaging to allow for screening for pancreatic cancer.  Right upper quadrant ultrasound in February did not reveal any focal hepatic abnormality.  We will plan CT imaging in August to screen for hepatocellular due to cirrhosis, which will allow for pancreatic cancer screening due to her BRCA1 mutation.

## 2021-06-24 ENCOUNTER — Other Ambulatory Visit: Payer: Self-pay | Admitting: Hematology and Oncology

## 2021-06-24 DIAGNOSIS — D696 Thrombocytopenia, unspecified: Secondary | ICD-10-CM

## 2021-06-24 DIAGNOSIS — Z171 Estrogen receptor negative status [ER-]: Secondary | ICD-10-CM

## 2021-06-24 LAB — LIPID PANEL
Chol/HDL Ratio: 2.9 ratio (ref 0.0–4.4)
Cholesterol, Total: 145 mg/dL (ref 100–199)
HDL: 50 mg/dL (ref >39)
LDL Chol Calc (NIH): 69 mg/dL (ref 0–99)
Triglycerides: 150 mg/dL — ABNORMAL HIGH (ref 0–149)
VLDL Cholesterol Cal: 26 mg/dL (ref 5–40)

## 2021-06-24 LAB — COMPREHENSIVE METABOLIC PANEL
ALT: 29 IU/L (ref 0–32)
AST: 32 IU/L (ref 0–40)
Albumin/Globulin Ratio: 1.8 (ref 1.2–2.2)
Albumin: 4.3 g/dL (ref 3.8–4.9)
Alkaline Phosphatase: 97 IU/L (ref 44–121)
BUN/Creatinine Ratio: 11 (ref 9–23)
BUN: 12 mg/dL (ref 6–24)
Bilirubin Total: 0.6 mg/dL (ref 0.0–1.2)
CO2: 23 mmol/L (ref 20–29)
Calcium: 9.3 mg/dL (ref 8.7–10.2)
Chloride: 102 mmol/L (ref 96–106)
Creatinine, Ser: 1.09 mg/dL — ABNORMAL HIGH (ref 0.57–1.00)
Globulin, Total: 2.4 g/dL (ref 1.5–4.5)
Glucose: 145 mg/dL — ABNORMAL HIGH (ref 70–99)
Potassium: 4.2 mmol/L (ref 3.5–5.2)
Sodium: 142 mmol/L (ref 134–144)
Total Protein: 6.7 g/dL (ref 6.0–8.5)
eGFR: 61 mL/min/{1.73_m2} (ref >59)

## 2021-06-24 LAB — CBC WITH DIFF/PLATELET
Basophils Absolute: 0 10*3/uL (ref 0.0–0.2)
Basos: 1 %
EOS (ABSOLUTE): 0.1 10*3/uL (ref 0.0–0.4)
Eos: 2 %
Hematocrit: 40.5 % (ref 34.0–46.6)
Hemoglobin: 12.8 g/dL (ref 11.1–15.9)
Immature Grans (Abs): 0 10*3/uL (ref 0.0–0.1)
Immature Granulocytes: 0 %
Lymphocytes Absolute: 1.8 10*3/uL (ref 0.7–3.1)
Lymphs: 27 %
MCH: 26.2 pg — ABNORMAL LOW (ref 26.6–33.0)
MCHC: 31.6 g/dL (ref 31.5–35.7)
MCV: 83 fL (ref 79–97)
Monocytes Absolute: 0.4 10*3/uL (ref 0.1–0.9)
Monocytes: 7 %
Neutrophils Absolute: 4.2 10*3/uL (ref 1.4–7.0)
Neutrophils: 63 %
Platelets: 87 10*3/uL — CL (ref 150–450)
RBC: 4.88 x10E6/uL (ref 3.77–5.28)
RDW: 15.3 % (ref 11.7–15.4)
WBC: 6.5 10*3/uL (ref 3.4–10.8)

## 2021-06-24 LAB — TSH: TSH: 1.7 u[IU]/mL (ref 0.450–4.500)

## 2021-06-24 LAB — HEMOGLOBIN A1C
Est. average glucose Bld gHb Est-mCnc: 171 mg/dL
Hgb A1c MFr Bld: 7.6 % — ABNORMAL HIGH (ref 4.8–5.6)

## 2021-06-24 NOTE — Chronic Care Management (AMB) (Unsigned)
  Chronic Care Management   Outreach Note  06/24/2021 Name: Haeleigh Streiff MRN: [id number] DOB: [birth year]  Olukemi Panchal is a 51 y.o. year old female who is a primary care patient of [name], Kirsten, MD. I reached out to [name] by phone today in response to a referral sent by Ms. Kyley Solow [place]'s primary care provider.  An unsuccessful telephone outreach was attempted today. The patient was referred to the case management team for assistance with care management and care coordination.   Follow Up Plan: A HIPAA compliant phone message was left for the patient providing contact information and requesting a return call.  The care management team will reach out to the patient again over the next 7 days.  If patient returns call to provider office, please advise to call [place]* at [phone].*  [facility]  Direct Dial: [phone]

## 2021-06-25 ENCOUNTER — Encounter: Payer: Self-pay | Admitting: Hematology and Oncology

## 2021-06-25 ENCOUNTER — Inpatient Hospital Stay: Payer: HMO | Attending: Hematology and Oncology

## 2021-06-25 ENCOUNTER — Inpatient Hospital Stay (INDEPENDENT_AMBULATORY_CARE_PROVIDER_SITE_OTHER): Payer: HMO | Admitting: Hematology and Oncology

## 2021-06-25 DIAGNOSIS — D693 Immune thrombocytopenic purpura: Secondary | ICD-10-CM | POA: Diagnosis not present

## 2021-06-25 DIAGNOSIS — Z1509 Genetic susceptibility to other malignant neoplasm: Secondary | ICD-10-CM | POA: Diagnosis not present

## 2021-06-25 DIAGNOSIS — C50311 Malignant neoplasm of lower-inner quadrant of right female breast: Secondary | ICD-10-CM | POA: Diagnosis not present

## 2021-06-25 DIAGNOSIS — Z171 Estrogen receptor negative status [ER-]: Secondary | ICD-10-CM

## 2021-06-25 DIAGNOSIS — M25551 Pain in right hip: Secondary | ICD-10-CM | POA: Diagnosis not present

## 2021-06-25 DIAGNOSIS — M25552 Pain in left hip: Secondary | ICD-10-CM | POA: Diagnosis not present

## 2021-06-25 DIAGNOSIS — K7581 Nonalcoholic steatohepatitis (NASH): Secondary | ICD-10-CM

## 2021-06-25 DIAGNOSIS — K7460 Unspecified cirrhosis of liver: Secondary | ICD-10-CM | POA: Diagnosis not present

## 2021-06-25 DIAGNOSIS — D696 Thrombocytopenia, unspecified: Secondary | ICD-10-CM

## 2021-06-25 DIAGNOSIS — Z1501 Genetic susceptibility to malignant neoplasm of breast: Secondary | ICD-10-CM

## 2021-06-25 LAB — CBC AND DIFFERENTIAL
HCT: 37 (ref 36–46)
Hemoglobin: 11.6 — AB (ref 12.0–16.0)
Neutrophils Absolute: 2.84
Platelets: 74 10*3/uL — AB (ref 150–400)
WBC: 4.5

## 2021-06-25 LAB — HEPATIC FUNCTION PANEL
ALT: 30 U/L (ref 7–35)
AST: 30 (ref 13–35)
Alkaline Phosphatase: 82 (ref 25–125)
Bilirubin, Total: 0.4

## 2021-06-25 LAB — BASIC METABOLIC PANEL
BUN: 16 (ref 4–21)
CO2: 30 — AB (ref 13–22)
Chloride: 102 (ref 99–108)
Creatinine: 0.8 (ref 0.5–1.1)
Glucose: 191
Potassium: 4.4 mEq/L (ref 3.5–5.1)
Sodium: 141 (ref 137–147)

## 2021-06-25 LAB — COMPREHENSIVE METABOLIC PANEL
Albumin: 4.1 (ref 3.5–5.0)
Calcium: 9.1 (ref 8.7–10.7)

## 2021-06-25 LAB — CBC
MCV: 81 (ref 81–99)
RBC: 4.52 (ref 3.87–5.11)

## 2021-06-25 NOTE — Chronic Care Management (AMB) (Signed)
  Chronic Care Management   Note  06/25/2021 Name: Makaylin Carlo MRN: [id number] DOB: May 28, 1970  Amarionna Arca is a 51 y.o. year old female who is a primary care patient of [name], Kirsten, MD. I reached out to [name] by phone today in response to a referral sent by Ms. Kevan Rosebush Myhand's PCP.  Ms. Puerto was given information about Chronic Care Management services today including:  CCM service includes personalized support from designated clinical staff supervised by her physician, including individualized plan of care and coordination with other care providers 24/7 contact phone numbers for assistance for urgent and routine care needs. Service will only be billed when office clinical staff spend 20 minutes or more in a month to coordinate care. Only one practitioner may furnish and bill the service in a calendar month. The patient may stop CCM services at any time (effective at the end of the month) by phone call to the office staff. The patient is responsible for co-pay (up to 20% after annual deductible is met) if co-pay is required by the individual health plan.   Patient agreed to services and verbal consent obtained.   Follow up plan: Telephone appointment with care management team member scheduled for:07/01/21  [facility]  Direct Dial: [phone]

## 2021-06-27 ENCOUNTER — Encounter: Payer: Self-pay | Admitting: Family Medicine

## 2021-06-27 DIAGNOSIS — M25551 Pain in right hip: Secondary | ICD-10-CM | POA: Insufficient documentation

## 2021-06-27 DIAGNOSIS — M25552 Pain in left hip: Secondary | ICD-10-CM | POA: Insufficient documentation

## 2021-06-27 NOTE — Assessment & Plan Note (Signed)
Refer to physical therapy

## 2021-06-27 NOTE — Assessment & Plan Note (Signed)
stable °

## 2021-06-27 NOTE — Assessment & Plan Note (Signed)
Chronic pain due to back pain. The current medical regimen is effective;  continue present plan and medications.

## 2021-06-27 NOTE — Assessment & Plan Note (Signed)
Well controlled.  ?No changes to medicines.  ?Continue to work on eating a healthy diet and exercise.  ?Labs drawn today.  ?

## 2021-06-27 NOTE — Assessment & Plan Note (Signed)
Order x-ray

## 2021-06-27 NOTE — Assessment & Plan Note (Addendum)
Well controlled.  ?No changes to medicines.  ?Continue to work on eating a healthy diet and exercise.  ?Labs drawn today.  ?

## 2021-06-27 NOTE — Assessment & Plan Note (Signed)
Control: not at goal. Recommend continue CGM. Recommend check feet daily. Recommend annual eye exams. Medicines: Recommend increase Trulicity 4.5 mg injection once weekly, Synjardy 12.5 mg/ 1000 mg 1 tablet twice daily, Tresiba inject 52 units daily.Continue to work on eating a healthy diet and exercise.  Labs drawn today.

## 2021-06-27 NOTE — Assessment & Plan Note (Signed)
The current medical regimen is effective;  continue present plan and medications.  Continue taking Fetzima 80 mg daily, Bupropion 300 mg 1 tablet daily, Buspirone 5 mg take 1 tablet three times a day.

## 2021-06-27 NOTE — Assessment & Plan Note (Signed)
Referral to physical therapy. 

## 2021-06-27 NOTE — Assessment & Plan Note (Signed)
The current medical regimen is effective;  continue present plan and medications.  

## 2021-06-27 NOTE — Assessment & Plan Note (Signed)
Increase Vyvanse to 70 mg daily.

## 2021-06-27 NOTE — Assessment & Plan Note (Signed)
Management per specialist. 

## 2021-06-27 NOTE — Assessment & Plan Note (Signed)
>>  ASSESSMENT AND PLAN FOR MODERATELY SEVERE RECURRENT MAJOR DEPRESSION (HCC) WRITTEN ON 06/27/2021  9:50 PM BY [name], KIRSTEN, MD  The current medical regimen is effective;  continue present plan and medications.  Continue taking Fetzima 80 mg daily, Bupropion 300 mg 1 tablet daily, Buspirone 5 mg take 1 tablet three times a day.

## 2021-06-28 ENCOUNTER — Telehealth: Payer: Self-pay | Admitting: *Deleted

## 2021-06-28 NOTE — Telephone Encounter (Signed)
   Telephone encounter was:  Unsuccessful.  06/28/2021 Name: Linda [name] MRN: [id number] DOB: 07-08-1970  Unsuccessful outbound call made today to assist with:  Food Insecurity  Outreach Attempt:  1st Attempt  A HIPAA compliant voice message was left requesting a return call.  Instructed patient to call back at   Instructed patient to call back at [phone]  at their earliest convenience. .  [place], Care Management  [phone] [address] , [place] [address] Email : [email or web address]

## 2021-06-30 ENCOUNTER — Telehealth: Payer: Self-pay | Admitting: *Deleted

## 2021-06-30 NOTE — Telephone Encounter (Signed)
   Telephone encounter was:  Successful.  06/30/2021 Name: Linda [name] MRN: [id number] DOB: September 09, 1970  Linda [name] is a 51 y.o. year old female who is a primary care patient of [name], Kirsten, MD . The community resource team was consulted for assistance with [place] guide performed the following interventions: Discussed resources to assist with food .Patient applying for food stamps and knows of major food banks in area provided a few others and patient was not needing any other reources at this time  Follow Up Plan:  No further follow up planned at this time. The patient has been provided with needed resources.  [place], Care Management  [phone] [address] , [place] [address] Email : [email or web address] [email or web address]

## 2021-07-01 ENCOUNTER — Ambulatory Visit (INDEPENDENT_AMBULATORY_CARE_PROVIDER_SITE_OTHER): Payer: HMO | Admitting: *Deleted

## 2021-07-01 DIAGNOSIS — G894 Chronic pain syndrome: Secondary | ICD-10-CM

## 2021-07-01 DIAGNOSIS — K7460 Unspecified cirrhosis of liver: Secondary | ICD-10-CM

## 2021-07-01 DIAGNOSIS — F330 Major depressive disorder, recurrent, mild: Secondary | ICD-10-CM

## 2021-07-01 DIAGNOSIS — F5081 Binge eating disorder: Secondary | ICD-10-CM

## 2021-07-02 ENCOUNTER — Telehealth: Payer: Self-pay | Admitting: Family Medicine

## 2021-07-02 NOTE — Telephone Encounter (Signed)
[facility] - about hip xray. In [employer]. Dr Tobie Poet

## 2021-07-02 NOTE — Chronic Care Management (AMB) (Signed)
Chronic Care Management    Clinical Social Work Note  07/02/2021 Name: Linda [name] MRN: [id number] DOB: [birth year]  Linda [name] is a 51 y.o. year old female who is a primary care patient of [name], Kirsten, MD. The CCM team was consulted to assist the patient with chronic disease management and/or care coordination needs related to: Transportation Needs , [employer] , Mental Health Counseling and Resources, and Financial Difficulties related to limited income .   Engaged with patient by telephone for initial visit in response to provider referral for social work chronic care management and care coordination services.   Consent to Services:  The patient was given information about Chronic Care Management services, agreed to services, and gave verbal consent prior to initiation of services.  Please see initial visit note for detailed documentation.   Patient agreed to services and consent obtained.   Assessment: Review of patient past medical history, allergies, medications, and health status, including review of relevant consultants reports was performed today as part of a comprehensive evaluation and provision of chronic care management and care coordination services.     SDOH (Social Determinants of Health) assessments and interventions performed:  SDOH Interventions    Flowsheet Row Most Recent Value  SDOH Interventions   Stress Interventions Offered [employer], Provide Counseling  Transportation Interventions Patient Resources (Friends/Family), Other (Comment)  [Care Guide referral]  Depression Interventions/Treatment  Medication        Advanced Directives Status: Not addressed in this encounter.  CCM Care Plan  Allergies  Allergen Reactions   Codeine Shortness Of Breath    Other reaction(s): SHOB   Celecoxib Other (See Comments)    Unknown reaction Other reaction(s): Unknown   Ezetimibe     Other reaction(s): Unknown    Ezetimibe-Simvastatin Other (See Comments)    Unknown reaction   Propranolol     Other reaction(s): Unknown   Propranolol Hcl Other (See Comments)    Unknown reaction   Simvastatin     Other reaction(s): Unknown    Outpatient Encounter Medications as of 07/01/2021  Medication Sig Note   zolpidem (AMBIEN) 10 MG tablet One before bed    atorvastatin (LIPITOR) 10 MG tablet Take 1 tablet (10 mg total) by mouth daily.    Blood Glucose Monitoring Suppl (ONETOUCH VERIO REFLECT) w/Device KIT AS DIRECTED    buPROPion (WELLBUTRIN XL) 300 MG 24 hr tablet Take 1 tablet (300 mg total) by mouth every evening.    busPIRone (BUSPAR) 5 MG tablet Take 1 tablet (5 mg total) by mouth 3 (three) times daily.    calcium citrate-vitamin D (CITRACAL+D) 315-200 MG-UNIT tablet Take 1 tablet by mouth 2 (two) times daily.    clotrimazole-betamethasone (LOTRISONE) cream Apply twice daily as needed to area of concern    Continuous Blood Gluc Receiver (FREESTYLE LIBRE 2 READER) DEVI E11.69 Check blood sugar 4 times daily as directed    Continuous Blood Gluc Sensor (FREESTYLE LIBRE 2 SENSOR) MISC E11.69 Change sensor every 14 days as directed    cyclobenzaprine (FLEXERIL) 10 MG tablet TAKE ONE TABLET BY MOUTH EVERY 8 HOURS AS NEEDED FOR muscle SPASMS    dicyclomine (BENTYL) 20 MG tablet TAKE ONE TABLET BY MOUTH BEFORE MEALS AND AT BEDTIME AS NEEDED FOR STOMACH CRAMPING    Dulaglutide (TRULICITY) 4.5 UK/0.2RK SOPN Inject 4.5 mg as directed once a week.    Empagliflozin-metFORMIN HCl (SYNJARDY) 12.05-998 MG TABS Take 1 tablet by mouth 2 (two) times daily.    famotidine (PEPCID)  20 MG tablet Take 1 tablet (20 mg total) by mouth 2 (two) times daily.    ferrous sulfate 325 (65 FE) MG tablet Take 325 mg by mouth every evening.    gabapentin (NEURONTIN) 300 MG capsule Take 2 capsules (600 mg total) by mouth 3 (three) times daily.    indomethacin (INDOCIN) 50 MG capsule Take 1 capsule (50 mg total) by mouth 3 (three) times daily  with meals. (Patient not taking: Reported on 05/20/2021)    insulin degludec (TRESIBA FLEXTOUCH) 200 UNIT/ML FlexTouch Pen Inject 52 Units into the skin daily.    Levomilnacipran HCl ER (FETZIMA) 80 MG CP24 Take 1 capsule by mouth daily.    levothyroxine (SYNTHROID) 75 MCG tablet Take 1 tablet (75 mcg total) by mouth daily.    lisdexamfetamine (VYVANSE) 70 MG capsule Take 1 capsule (70 mg total) by mouth daily.    loratadine (CLARITIN) 10 MG tablet Take 10 mg by mouth daily. Taking 3-4 days each week due to bone pain associated with shot from cancer center.    losartan (COZAAR) 50 MG tablet Take 1 tablet (50 mg total) by mouth every evening.    Magnesium 500 MG CAPS 500 mg.    morphine (MS CONTIN) 30 MG 12 hr tablet Take 1 tablet (30 mg total) by mouth every 12 (twelve) hours.    Multiple Vitamin (MULTIVITAMIN WITH MINERALS) TABS tablet Take 1 tablet by mouth daily.  06/08/2018: Pt plans on purchasing again when she can get out.   omega-3 acid ethyl esters (LOVAZA) 1 g capsule Take 2 capsules (2 g total) by mouth 2 (two) times daily.    ondansetron (ZOFRAN) 4 MG tablet Take 1 tablet (4 mg total) by mouth every 4 (four) hours as needed for nausea.    OneTouch Delica Lancets 16R MISC 1 each by Does not apply route daily before breakfast. Check blood sugar twice daily.    pantoprazole (PROTONIX) 40 MG tablet Take 1 tablet (40 mg total) by mouth 2 (two) times daily.    potassium chloride (MICRO-K) 10 MEQ CR capsule Take 2 capsules (20 mEq total) by mouth 2 (two) times daily.    prochlorperazine (COMPAZINE) 10 MG tablet TAKE ONE TABLET BY MOUTH EVERY 6 HOURS AS NEEDED FOR NAUSEA AND VOMITING    rOPINIRole (REQUIP) 0.25 MG tablet Take 1 tablet (0.25 mg total) by mouth at bedtime.    SODIUM FLUORIDE 5000 SENSITIVE 1.1-5 % GEL Take by mouth 2 (two) times daily.    Specialty Vitamins Products (MAGNESIUM, AMINO ACID CHELATE,) 133 MG tablet Take 1 tablet by mouth at bedtime.    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   Binge eating disorder 11/23/2020   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 04/17/2020   Idiopathic thrombocytopenic purpura (ITP) ([place]) 01/27/2020   Malignant neoplasm of lower-inner quadrant of right breast of female, estrogen receptor negative ([place]) 10/01/2019   Chronic pain syndrome 07/08/2019   Depression, major,  recurrent, mild ([place]) 32/35/5732   Uncomplicated opioid dependence ([place]) 07/08/2019   Mixed hyperlipidemia 04/11/2019   Dyslipidemia associated with type 2 diabetes mellitus ([place]) 04/11/2019   Acquired hypothyroidism 04/11/2019   Vitamin D insufficiency 04/11/2019   Class 2 severe obesity due to excess calories with serious comorbidity and body mass index (BMI) of 36.0 to 36.9 in adult [facility]) 04/11/2019   Liver cirrhosis secondary to NASH (nonalcoholic steatohepatitis) ([place]) 09/13/2018    Class: Chronic   BRCA1 positive 06/14/2016    Conditions to be addressed/monitored: Anxiety and Depression; Limited social support, Transportation, Mental Health Concerns , and Lacks knowledge of community resource:    Care Plan : LCSW Plan of Care  Updates made by Deirdre Peer, LCSW since 07/02/2021 12:00 AM     Problem: Symptoms (Depression)      Long-Range Goal: Reduce symptoms of depression and connect with community/activities to improve QOL   Start Date: 07/01/2021  Expected End Date: 09/22/2021   This Visit's Progress: On track  Priority: High  Note:   Current Barriers:  Limited social support, Transportation, Limited access to food, Mental Health Concerns , Social Isolation, and Lacks knowledge of community resource:     CSW Clinical Goal(s):  Patient  will explore community resource options for unmet needs related to:  Transportation, Sales promotion account executive , Landscape architect , Depression  , and Social Connections through collaboration with [place] representative, provider, and care team.   Interventions: CSW made contact with pt today and discussed her depression. Pt reports depression and anxiety with no current or history of SI/HI. She denies any hospitalizations for either and is on medications to treat. CSW completed depression screening with pt which she scored high; "21", showing significant depression symptoms. Pt acknowledges poor energy, interest and low attention. She also feels her pain issues overlap with the depression. Pt is interested in counseling and agrees to referral being placed with [facility].     07/01/2021    2:19 PM 06/23/2021    7:40 AM 03/23/2021    8:36 AM 01/20/2021    7:59 AM 11/16/2020   10:49 AM  Depression screen PHQ 2/9  Decreased Interest 3 0 0 0 2  Down, Depressed, Hopeless 1 0 0 1 1  PHQ - 2 Score 4 0 0 1 3  Altered sleeping 3  1 0 1  Tired, decreased energy 3  1 1  0  Change in appetite 3  1 0 3  Feeling bad or failure about yourself  3  0 1 1  Trouble concentrating 3  0 1 2  Moving slowly or fidgety/restless 2  0 0 3  Suicidal thoughts 0  0 0 0  PHQ-9 Score 21  3 4 13   Difficult doing work/chores Somewhat difficult  Not difficult at all Not difficult at all Somewhat difficult     1:1 collaboration with primary care provider regarding development and update of comprehensive plan of care as evidenced by provider attestation and co-signature Inter-disciplinary care team collaboration (see longitudinal plan of care) Evaluation of current treatment  plan related to  self management and patient's adherence to plan as established by provider Review resources, discussed options and provided patient information about  Community food options  Referral to care guide ( )    Mental Health:  (Status: New goal.) Evaluation of current treatment plan related to Depression: depressed mood anxiety hopelessness loss of energy/fatigue anhedonia insomnia disturbed sleep increased appetite and Eating Disorder: "binge eating" Depression screen reviewed  PHQ2/  PHQ9 completed Solution-Focused Strategies employed:  Mindfulness or Relaxation training provided Active listening / Reflection utilized  Emotional Support Provided Problem Marissa strategies reviewed Provided psychoeducation for mental health needs  Reviewed mental health medications and discussed importance of compliance:    Task & activities to accomplish goals: Expect a call from [place] to schedule your counseling appointment. Follow up on possible insurance options discussed  Continue with compliance of taking medication prescribed by Doctor Consider activities to participate in that provide socialization, activity, etc Find ways to distract/minimize focus on pain and depression- staying active/engaged            Follow Up Plan: Appointment scheduled for SW follow up with client by phone on: 07/15/21      Eduard Clos MSW, LCSW Licensed Clinical Social Worker [place]   [phone]

## 2021-07-02 NOTE — Patient Instructions (Signed)
Visit Information  Thank you for taking time to visit with me today. Please don't hesitate to contact me if I can be of assistance to you before our next scheduled telephone appointment.  Following are the goals we discussed today:   -expect call from [facility] to schedule virtual appointment with counseling - keep 90 percent of counseling appointments - schedule counseling appointment  -review resource material mailed/emailed to you and consider connecting -find ways to distract yourself from pain and sadness- activity helps!   Our next appointment is by telephone on 07/15/21    Please call the care guide team at [phone] if you need to cancel or reschedule your appointment.   If you are experiencing a Mental Health or [place] or need someone to talk to, please call the [place] National Suicide Prevention Lifeline: 510 154 1790 or TTY: (817) [phone] TTY (458)413-4020) to talk to a trained counselor call 911   Patient verbalizes understanding of instructions and care plan provided today and agrees to view in [place]. Active MyChart status and patient understanding of how to access instructions and care plan via MyChart confirmed with patient.     Eduard Clos MSW, LCSW Licensed Clinical Social Worker [place]   [phone]

## 2021-07-05 ENCOUNTER — Other Ambulatory Visit: Payer: Self-pay

## 2021-07-05 DIAGNOSIS — M25552 Pain in left hip: Secondary | ICD-10-CM

## 2021-07-05 DIAGNOSIS — M25551 Pain in right hip: Secondary | ICD-10-CM

## 2021-07-05 NOTE — Telephone Encounter (Signed)
Linda [name] spoke with patient this morning regarding results.  [name], [place] 07/05/21 1:49 PM

## 2021-07-06 ENCOUNTER — Ambulatory Visit: Payer: HMO

## 2021-07-14 ENCOUNTER — Encounter: Payer: Self-pay | Admitting: Physician Assistant

## 2021-07-14 ENCOUNTER — Ambulatory Visit (INDEPENDENT_AMBULATORY_CARE_PROVIDER_SITE_OTHER): Payer: HMO | Admitting: Physician Assistant

## 2021-07-14 VITALS — BP 124/72 | HR 106 | Temp 99.1°F | Resp 18 | Ht 62.0 in | Wt 210.0 lb

## 2021-07-14 DIAGNOSIS — Z23 Encounter for immunization: Secondary | ICD-10-CM | POA: Diagnosis not present

## 2021-07-14 DIAGNOSIS — J060 Acute laryngopharyngitis: Secondary | ICD-10-CM

## 2021-07-14 DIAGNOSIS — L03113 Cellulitis of right upper limb: Secondary | ICD-10-CM | POA: Diagnosis not present

## 2021-07-14 MED ORDER — AMOXICILLIN-POT CLAVULANATE 875-125 MG PO TABS: 1.0000 | ORAL_TABLET | Freq: Two times a day (BID) | ORAL | 0 refills | Status: DC

## 2021-07-14 MED ORDER — BENZONATATE 100 MG PO CAPS: 100.0000 mg | ORAL_CAPSULE | Freq: Two times a day (BID) | ORAL | 0 refills | Status: DC | PRN

## 2021-07-14 MED ORDER — CEFTRIAXONE SODIUM 1 G IJ SOLR
1.0000 g | Freq: Once | INTRAMUSCULAR | Status: AC
  Administered 2021-07-14: 1 g via INTRAMUSCULAR

## 2021-07-14 NOTE — Progress Notes (Signed)
Subjective:  Patient ID: Linda [name], female    DOB: [birth year]  Age: 51 y.o. MRN: [id number]  Chief Complaint  Patient presents with   Cough   Nasal Congestion   Animal Bite    Right hand    HPI  Pt complains of mild congestion and cough for the past few days - denies fever or malaise - cough productive at times and has had some sinus congestion as well  Pt complains of right hand redness and swelling.  She was playing with her dog (which is up to date on shots) and had a toy in her hand and dog jumped up and accidentally bit her in palm of right hand Hand is red and swollen - with puncture wound in palm Current Outpatient Medications on File Prior to Visit  Medication Sig Dispense Refill   atorvastatin (LIPITOR) 10 MG tablet Take 1 tablet (10 mg total) by mouth daily. 90 tablet 0   Blood Glucose Monitoring Suppl (ONETOUCH VERIO REFLECT) w/Device KIT AS DIRECTED     buPROPion (WELLBUTRIN XL) 300 MG 24 hr tablet Take 1 tablet (300 mg total) by mouth every evening. 90 tablet 1   busPIRone (BUSPAR) 5 MG tablet Take 1 tablet (5 mg total) by mouth 3 (three) times daily. 90 tablet 2   calcium citrate-vitamin D (CITRACAL+D) 315-200 MG-UNIT tablet Take 1 tablet by mouth 2 (two) times daily.     clotrimazole-betamethasone (LOTRISONE) cream Apply twice daily as needed to area of concern 45 g 2   Continuous Blood Gluc Receiver (FREESTYLE LIBRE 2 READER) DEVI E11.69 Check blood sugar 4 times daily as directed 1 each 0   Continuous Blood Gluc Sensor (FREESTYLE LIBRE 2 SENSOR) MISC E11.69 Change sensor every 14 days as directed 6 each 3   cyclobenzaprine (FLEXERIL) 10 MG tablet TAKE ONE TABLET BY MOUTH EVERY 8 HOURS AS NEEDED FOR muscle SPASMS 30 tablet 3   dicyclomine (BENTYL) 20 MG tablet TAKE ONE TABLET BY MOUTH BEFORE MEALS AND AT BEDTIME AS NEEDED FOR STOMACH CRAMPING 180 tablet 1   Dulaglutide (TRULICITY) 4.5 HE/1.7EY SOPN Inject 4.5 mg as directed once a week. 6 mL 0    Empagliflozin-metFORMIN HCl (SYNJARDY) 12.05-998 MG TABS Take 1 tablet by mouth 2 (two) times daily. 180 tablet 0   famotidine (PEPCID) 20 MG tablet Take 1 tablet (20 mg total) by mouth 2 (two) times daily. 180 tablet 1   ferrous sulfate 325 (65 FE) MG tablet Take 325 mg by mouth every evening.     gabapentin (NEURONTIN) 300 MG capsule Take 2 capsules (600 mg total) by mouth 3 (three) times daily. 180 capsule 0   insulin degludec (TRESIBA FLEXTOUCH) 200 UNIT/ML FlexTouch Pen Inject 52 Units into the skin daily. 9 mL 3   Levomilnacipran HCl ER (FETZIMA) 80 MG CP24 Take 1 capsule by mouth daily. 90 capsule 1   levothyroxine (SYNTHROID) 75 MCG tablet Take 1 tablet (75 mcg total) by mouth daily. 90 tablet 1   lisdexamfetamine (VYVANSE) 70 MG capsule Take 1 capsule (70 mg total) by mouth daily. 30 capsule 0   loratadine (CLARITIN) 10 MG tablet Take 10 mg by mouth daily. Taking 3-4 days each week due to bone pain associated with shot from cancer center.     losartan (COZAAR) 50 MG tablet Take 1 tablet (50 mg total) by mouth every evening. 90 tablet 1   Magnesium 500 MG CAPS 500 mg.     morphine (MS CONTIN) 30 MG 12 hr  tablet Take 1 tablet (30 mg total) by mouth every 12 (twelve) hours. 60 tablet 0   Multiple Vitamin (MULTIVITAMIN WITH MINERALS) TABS tablet Take 1 tablet by mouth daily.      omega-3 acid ethyl esters (LOVAZA) 1 g capsule Take 2 capsules (2 g total) by mouth 2 (two) times daily. 360 capsule 1   ondansetron (ZOFRAN) 4 MG tablet Take 1 tablet (4 mg total) by mouth every 4 (four) hours as needed for nausea. 90 tablet 3   OneTouch Delica Lancets 96K MISC 1 each by Does not apply route daily before breakfast. Check blood sugar twice daily. 100 each 3   pantoprazole (PROTONIX) 40 MG tablet Take 1 tablet (40 mg total) by mouth 2 (two) times daily. 180 tablet 1   potassium chloride (MICRO-K) 10 MEQ CR capsule Take 2 capsules (20 mEq total) by mouth 2 (two) times daily. 360 capsule 0    prochlorperazine (COMPAZINE) 10 MG tablet TAKE ONE TABLET BY MOUTH EVERY 6 HOURS AS NEEDED FOR NAUSEA AND VOMITING 90 tablet 0   rOPINIRole (REQUIP) 0.25 MG tablet Take 1 tablet (0.25 mg total) by mouth at bedtime. 90 tablet 1   SODIUM FLUORIDE 5000 SENSITIVE 1.1-5 % GEL Take by mouth 2 (two) times daily.     Specialty Vitamins Products (MAGNESIUM, AMINO ACID CHELATE,) 133 MG tablet Take 1 tablet by mouth at bedtime.     Vitamin D, Ergocalciferol, (DRISDOL) 1.25 MG (50000 UNIT) CAPS capsule Take one capsule by mouth weekly 12 capsule 1   zolpidem (AMBIEN) 10 MG tablet One before bed 30 tablet 5   Current Facility-Administered Medications on File Prior to Visit  Medication Dose Route Frequency Provider Last Rate Last Admin   sodium chloride flush (NS) 0.9 % injection 10 mL  10 mL Intracatheter PRN Mosher, Kelli A, PA-C   10 mL at 02/19/21 1004   Past Medical History:  Diagnosis Date   Acute postoperative respiratory insufficiency 04/17/2020   AKI (acute kidney injury) ([place]) 04/17/2020   Anemia    Anemia, unspecified    Anemia, unspecified    Anemia, unspecified    Anxiety    Arthritis    BRCA gene mutation positive    Chronic pain syndrome    Depression    Diabetes mellitus without complication ([place])    type 2   Diabetic ulcer of toe of left foot associated with type 2 diabetes mellitus, with fat layer exposed ([place]) 03/27/2020   Gastritis    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 No longer has   Past  Surgical History:  Procedure Laterality Date   APPENDECTOMY     BACK SURGERY     x 3 lower disc   BILATERAL TOTAL MASTECTOMY WITH AXILLARY LYMPH NODE DISSECTION Bilateral 09/2019   CHOLECYSTECTOMY     nephrolithiasis     ROBOTIC ASSISTED TOTAL HYSTERECTOMY WITH BILATERAL SALPINGO OOPHERECTOMY Bilateral 06/14/2016   Procedure: ROBOTIC ASSISTED TOTAL HYSTERECTOMY WITH  BILATERAL SALPINGO OOPHORECTOMY;  Surgeon: Nancy Marus, MD;  Location: [facility];  Service: Gynecology;  Laterality: Bilateral;    Family History  Problem Relation Age of Onset   Cancer Mother        breast   CAD Father    Diabetes Father    Heart failure Father    Cancer Father        renal carcinoma   Kidney failure Father    Cancer Brother 69       renal carcinoma.   Stroke Paternal Grandmother    Social History   Socioeconomic History   Marital status: Divorced    Spouse name: Not on file   Number of children: Not on file   Years of education: Not on file   Highest education level: Not on file  Occupational History   Occupation: disabled  Tobacco Use   Smoking status: Never   Smokeless tobacco: Never  Substance and Sexual Activity   Alcohol use: No   Drug use: No   Sexual activity: Yes  Other Topics Concern   Not on file  Social History Narrative   wears sunscreen, brushes and flosses daily, see's dentist bi-annually, has smoke/carbon monoxide detectors, wears a seatbelt and practices gun safety   Social Determinants of Health   Financial Resource Strain: High Risk (06/30/2021)   Overall Financial Resource Strain (CARDIA)    Difficulty of Paying Living Expenses: Hard  Food Insecurity: Food Insecurity Present (06/30/2021)   Hunger Vital Sign    Worried About Running Out of Food in the Last Year: Often true    Ran Out of Food in the Last Year: Often true  Transportation Needs: No Transportation Needs (07/01/2021)   PRAPARE - Hydrologist (Medical): No    Lack of  Transportation (Non-Medical): No  Physical Activity: Not on file  Stress: Stress Concern Present (07/01/2021)   [place]    Feeling of Stress : To some extent  Social Connections: Not on file    Review of Systems CONSTITUTIONAL: Negative for chills, fatigue, fever, E/N/T: see HPI CARDIOVASCULAR: Negative for chest pain, dizziness, RESPIRATORY: see HPI MSK: see HPI INTEGUMENTARY: see HPI       Objective:  PHYSICAL EXAM:   VS: BP 124/72   Pulse (!) 106   Temp 99.1 F (37.3 C)   Resp 18   Ht 5' 2"  (1.575 m)   Wt 210 lb (95.3 kg)   LMP 06/13/2009   SpO2 97%   BMI 38.41 kg/m   GEN: Well nourished, well developed, in no acute distress  HEENT: normal external ears and nose - normal external auditory canals and TMS -  Oropharynx - with erythema/pnd Cardiac: RRR; no murmurs,  Respiratory:  normal respiratory rate and pattern with no distress - normal breath sounds with no rales, rhonchi, wheezes or rubs  Skin: right palm with puncture wound noted - moderate erythema and mild drainage noted --- area is cleansed and bandaged (pt had put honey on her wound - advised not to use)  Lab Results  Component Value Date   WBC 4.5 06/25/2021   HGB 11.6 (A) 06/25/2021   HCT 37 06/25/2021   PLT 74 (A) 06/25/2021   GLUCOSE 145 (H) 06/23/2021   CHOL 145 06/23/2021   TRIG 150 (H) 06/23/2021   HDL 50 06/23/2021   LDLCALC 69 06/23/2021   ALT 30 06/25/2021   AST 30 06/25/2021   NA  141 06/25/2021   K 4.4 06/25/2021   CL 102 06/25/2021   CREATININE 0.8 06/25/2021   BUN 16 06/25/2021   CO2 30 (A) 06/25/2021   TSH 1.700 06/23/2021   HGBA1C 7.6 (H) 06/23/2021   MICROALBUR 30 10/09/2019      Assessment & Plan:   Problem List Items Addressed This Visit   None Visit Diagnoses     Cellulitis of right hand    -  Primary   Relevant Medications   cefTRIAXone (ROCEPHIN) injection 1 g (Completed)    amoxicillin-clavulanate (AUGMENTIN) 875-125 MG tablet   Other Relevant Orders   Tdap vaccine greater than or equal to 7yo IM (Completed)   Acute laryngopharyngitis       Relevant Medications   benzonatate (TESSALON) 100 MG capsule     .  Meds ordered this encounter  Medications   cefTRIAXone (ROCEPHIN) injection 1 g   amoxicillin-clavulanate (AUGMENTIN) 875-125 MG tablet    Sig: Take 1 tablet by mouth 2 (two) times daily.    Dispense:  20 tablet    Refill:  0    Order Specific Question:   Supervising Provider    Answer:   Shelton Silvas   benzonatate (TESSALON) 100 MG capsule    Sig: Take 1 capsule (100 mg total) by mouth 2 (two) times daily as needed for cough.    Dispense:  30 capsule    Refill:  0    Order Specific Question:   Supervising Provider    AnswerShelton Silvas    Orders Placed This Encounter  Procedures   Tdap vaccine greater than or equal to 7yo IM     Follow-up: Return if symptoms worsen or fail to improve.  An After Visit Summary was printed and given to the patient.  Yetta Flock [name] [phone]

## 2021-07-15 ENCOUNTER — Ambulatory Visit: Payer: HMO | Admitting: *Deleted

## 2021-07-15 ENCOUNTER — Ambulatory Visit: Payer: HMO

## 2021-07-15 ENCOUNTER — Other Ambulatory Visit: Payer: Self-pay | Admitting: Family Medicine

## 2021-07-15 DIAGNOSIS — F330 Major depressive disorder, recurrent, mild: Secondary | ICD-10-CM

## 2021-07-15 DIAGNOSIS — G894 Chronic pain syndrome: Secondary | ICD-10-CM

## 2021-07-15 DIAGNOSIS — F5081 Binge eating disorder: Secondary | ICD-10-CM

## 2021-07-15 DIAGNOSIS — E1121 Type 2 diabetes mellitus with diabetic nephropathy: Secondary | ICD-10-CM

## 2021-07-15 DIAGNOSIS — L03113 Cellulitis of right upper limb: Secondary | ICD-10-CM

## 2021-07-15 DIAGNOSIS — E1165 Type 2 diabetes mellitus with hyperglycemia: Secondary | ICD-10-CM
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
## 2021-07-15 NOTE — Patient Instructions (Signed)
Visit Information  Thank you for taking time to visit with me today. Please don't hesitate to contact me if I can be of assistance to you before our next scheduled telephone appointment.  Following are the goals we discussed today:  -call [facility] to schedule visit -review info emailed to you about the HTA benefits you have (dental, etc)  - keep 90 percent of counseling appointments - schedule counseling appointment  -review resource material mailed/emailed to you and consider connecting -find ways to distract yourself from pain and sadness- activity helps!   Our next appointment is by telephone on 08/12/21    Please call the care guide team at [phone] if you need to cancel or reschedule your appointment.   If you are experiencing a Mental Health or [place] or need someone to talk to, please call the [place] National Suicide Prevention Lifeline: 908-071-0809 or TTY: 915-403-4850 TTY [phone]) to talk to a trained counselor call 911   The patient verbalized understanding of instructions, educational materials, and care plan provided today and DECLINED offer to receive copy of patient instructions, educational materials, and care plan.   Eduard Clos MSW, LCSW Licensed Clinical Social Worker [place]   [phone]

## 2021-07-15 NOTE — Patient Instructions (Signed)
Visit Information   Goals Addressed   None    Patient Care Plan: CCM Pharmacy Care Plan     Problem Identified: diabetes, hyperlipidemia, hypertension   Priority: High  Onset Date: 03/05/2020     Long-Range Goal: Disease Management   Start Date: 03/05/2020  Expected End Date: 03/05/2021  Recent Progress: On track  Priority: High  Note:   Current Barriers:  Unable to maintain control of blood sugar while on steroids/chemotherapy.    Pharmacist Clinical Goal(s):  Over the next 90 days, patient will achieve control of diabetes as evidenced by blood sugar and a1c through collaboration with PharmD and provider.   Interventions: 1:1 collaboration with [name], Elnita Maxwell, MD regarding development and update of comprehensive plan of care as evidenced by provider attestation and co-signature Inter-disciplinary care team collaboration (see longitudinal plan of care) Comprehensive medication review performed; medication list updated in electronic medical record  Hypertension (BP goal <130/80) BP Readings from Last 3 Encounters:  04/30/21 120/78  04/19/21 140/79  03/23/21 118/70  -controlled -Current treatment: losartan 50 mg daily Appropriate, Effective, Safe, Accessible -Medications previously tried: none reported  -Current home readings: well controlled when checked at home or office -Current dietary habits: eating what family prepares while on chemotherapy -Current exercise habits: chemotherapy limits exercise and energy -Denies hypotensive/hypertensive symptoms -Educated on BP goals and benefits of medications for prevention of heart attack, stroke and kidney damage; Daily salt intake goal < 2300 mg; -Counseled to monitor BP at home as needed, document, and provide log at future appointments -Counseled on diet and exercise extensively Recommended to continue current medication  Hyperlipidemia: (LDL goal < 70) The 10-year ASCVD risk score (Arnett DK, et al., 2019) is: 2.8%    Values used to calculate the score:     Age: 51 years     Sex: Female     Is Non-Hispanic African American: No     Diabetic: Yes     Tobacco smoker: No     Systolic Blood Pressure: 629 mmHg     Is BP treated: Yes     HDL Cholesterol: 50 mg/dL     Total Cholesterol: 157 mg/dL Lab Results  Component Value Date   CHOL 157 03/23/2021   CHOL 227 (H) 11/16/2020   CHOL 165 07/24/2020   Lab Results  Component Value Date   HDL 50 03/23/2021   HDL 46 11/16/2020   HDL 57 07/24/2020   Lab Results  Component Value Date   LDLCALC 78 03/23/2021   [place] 130 (H) 11/16/2020   LDLCALC 86 07/24/2020   Lab Results  Component Value Date   TRIG 170 (H) 03/23/2021   TRIG 284 (H) 11/16/2020   TRIG 126 07/24/2020   Lab Results  Component Value Date   CHOLHDL 3.1 03/23/2021   CHOLHDL 4.9 (H) 11/16/2020   CHOLHDL 2.9 07/24/2020  No results found for: LDLDIRECT -uncontrolled  -Current treatment: Atorvastatin 53m Appropriate, Effective, Safe, Accessible -Medications previously tried: Pravastatin -Current dietary patterns: eating what family provides during chemotherapy -Current exercise habits: limited due to chemo -Educated on Cholesterol goals;  Importance of limiting foods high in cholesterol; Exercise goal of 150 minutes per week; -Counseled on diet and exercise extensively  Diabetes (A1c goal <7%) Lab Results  Component Value Date   HGBA1C 7.1 (H) 03/23/2021   HGBA1C 8.2 (H) 11/16/2020   HGBA1C 5.8 (H) 07/24/2020   Lab Results  Component Value Date   MICROALBUR 30 10/09/2019   LDLCALC 78 03/23/2021   CREATININE 1.06 (  H) 03/23/2021   Lab Results  Component Value Date   NA 142 03/23/2021   K 4.4 03/23/2021   CREATININE 1.06 (H) 03/23/2021   EGFR 63 03/23/2021   GFRNONAA >60 11/27/2019   GLUCOSE 134 (H) 03/23/2021   Lab Results  Component Value Date   WBC 6.3 03/23/2021   HGB 13.0 03/23/2021   HCT 40.9 03/23/2021   MCV 86 03/23/2021   PLT 99 (LL) 03/23/2021   -uncontrolled -Current medications: Trulicity 4.5 mg weekly Appropriate, Query effective, Safe, Accessible Empagliflozin/Metformin 12.5/1000mg BID Appropriate, Query effective, Safe, Accessible Tresiba 52 units Appropriate, Query effective, Safe, Accessible [employer] (4x/day) Appropriate, Query effective, Safe, Accessible -Medications previously tried: none reported  -Current home glucose readings fasting glucose:  May 2023: 06-09-2021 251, 259, 271, 196, 200, 198 June 2023: Didn't have readings on her post prandial glucose: -Reports hypoglycemic symptoms -Current meal patterns:  Patient has food insecurity, unable to get food unless it's at a food bank. Even there, you can't choose much.  -Current exercise: limited due to chemotherapy -Educated onA1c and blood sugar goals; Exercise goal of 150 minutes per week; Benefits of routine self-monitoring of blood sugar; -Counseled to check feet daily and get yearly eye exams -Counseled on diet and exercise extensively  May 2023:  -Patient has food insecurity, will ask PCP if we can do referral for social worker -Patient took DM class many years ago but she has difficulty making healthy meals out of what the food banks give her. She'd like to meet with a dietician/take a class. Will ask for referral -Patient used to qualify for Medicaid, but when her father passed and left her some money, she didn't qualify anymore. She believes she is close to qualifying again. She will call Medicaid as soon as she hangs up with me and try to apply/find out when she qualifies -Patient states she has issues with binge eating. She will wake up in the middle of the night and binge eat. Spent extensive time counseling that the key to DM isn't "not eating" but it's eating consistently. Her "homework" for the next month will be to try to eat consistently and not skip meals. She is unable to focus on healthy food due to cost, so we'll focus on consistency until  the social worker can help out June 2023: Patient states she isn't eating as much at night. States it's hard to eat consistently during the day because "Vyvanse" prevents the urge. Had appt with Dietician this week but had to cancel due to cold. Rescheduled until next month. LCSW reaching out this afternoon  Depression/Anxiety (Goal: manage symptoms) -uncontrolled -Current treatment: Wellbutrin XL 300 mg daily Appropriate, Effective, Safe, Accessible Fetzima 80 mg Appropriate, Effective, Safe, Accessible Lorazepam 0.5 mg bid prn anxiety Appropriate, Effective, Safe, Accessible Vyvanse 89m QD Appropriate, Effective, Safe, Accessible -Medications previously tried/failed: none reported -PHQ9:     03/23/2021    8:36 AM 01/20/2021    7:59 AM 11/16/2020   10:49 AM  Depression screen PHQ 2/9  Decreased Interest 0 0 2  Down, Depressed, Hopeless 0 1 1  PHQ - 2 Score 0 1 3  Altered sleeping 1 0 1  Tired, decreased energy 1 1 0  Change in appetite 1 0 3  Feeling bad or failure about yourself  0 1 1  Trouble concentrating 0 1 2  Moving slowly or fidgety/restless 0 0 3  Suicidal thoughts 0 0 0  PHQ-9 Score _0 Difficult doing work/chores Not difficult  at all Not difficult at all Somewhat difficult  -GAD7:     07/21/2020   11:40 AM 03/09/2020    9:52 AM  GAD 7 : Generalized Anxiety Score  Nervous, Anxious, on Edge 2 3  Control/stop worrying 2 3  Worry too much - different things 2 3  Trouble relaxing 2 3  Restless 2 2  Easily annoyed or irritable 1 1  Afraid - awful might happen 2 1  Total GAD 7 Score 13 16   -Educated on Benefits of cognitive-behavioral therapy with or without medication Recommend continue current therapy   Patient Goals/Self-Care Activities Over the next 90 days, patient will:  - take medications as prescribed check glucose three times daily , document, and provide at future appointments  Follow Up Plan: Telephone follow up appointment with care management  team member scheduled for: August 2023  [place] Constable, Sherian Rein.D. - [phone]     Patient Care Plan: LCSW Plan of Care     Problem Identified: Symptoms (Depression)      Long-Range Goal: Reduce symptoms of depression and connect with community/activities to improve QOL   Start Date: 07/01/2021  Expected End Date: 09/22/2021  This Visit's Progress: On track  Priority: High  Note:   Current Barriers:  Limited social support, Transportation, Limited access to food, Mental Health Concerns , Social Isolation, and Lacks knowledge of community resource:     CSW Clinical Goal(s):  Patient  will explore community resource options for unmet needs related to:  Transportation, Sales promotion account executive , Landscape architect , Depression  , and Social Connections through collaboration with [place] representative, provider, and care team.   Interventions: CSW made contact with pt today and discussed her depression. Pt reports depression and anxiety with no current or history of SI/HI. She denies any hospitalizations for either and is on medications to treat. CSW completed depression screening with pt which she scored high; "21", showing significant depression symptoms. Pt acknowledges poor energy, interest and low attention. She also feels her pain issues overlap with the depression. Pt is interested in counseling and agrees to referral being placed with [facility].     07/01/2021    2:19 PM 06/23/2021    7:40 AM 03/23/2021    8:36 AM 01/20/2021    7:59 AM 11/16/2020   10:49 AM  Depression screen PHQ 2/9  Decreased Interest 3 0 0 0 2  Down, Depressed, Hopeless 1 0 0 1 1  PHQ - 2 Score 4 0 0 1 3  Altered sleeping 3  1 0 1  Tired, decreased energy _0 0  Change in appetite 3  1 0 3  Feeling bad or failure about yourself  3  0 1 1  Trouble concentrating 3  0 1 2  Moving slowly or fidgety/restless 2  0 0 3  Suicidal thoughts 0  0 0 0  PHQ-9 Score _1 Difficult doing work/chores Somewhat difficult   Not difficult at all Not difficult at all Somewhat difficult     1:1 collaboration with primary care provider regarding development and update of comprehensive plan of care as evidenced by provider attestation and co-signature Inter-disciplinary care team collaboration (see longitudinal plan of care) Evaluation of current treatment plan related to  self management and patient's adherence to plan as established by provider Review resources, discussed options and provided patient information about  Community food options  Referral to care guide ( )    Mental Health:  (  Status: New goal.) Evaluation of current treatment plan related to Depression: depressed mood anxiety hopelessness loss of energy/fatigue anhedonia insomnia disturbed sleep increased appetite and Eating Disorder: "binge eating" Depression screen reviewed  PHQ2/ PHQ9 completed Solution-Focused Strategies employed:  Mindfulness or Relaxation training provided Active listening / Reflection utilized  Emotional Support Provided Problem [place] strategies reviewed Provided psychoeducation for mental health needs  Reviewed mental health medications and discussed importance of compliance:    Task & activities to accomplish goals: Expect a call from [facility] to schedule your counseling appointment. Follow up on possible insurance options discussed  Continue with compliance of taking medication prescribed by Doctor Consider activities to participate in that provide socialization, activity, etc Find ways to distract/minimize focus on pain and depression- staying active/engaged           Ms. Habermehl was given information about Chronic Care Management services today including:  CCM service includes personalized support from designated clinical staff supervised by her physician, including individualized plan of care and coordination with other care providers 24/7 contact phone numbers for  assistance for urgent and routine care needs. Standard insurance, coinsurance, copays and deductibles apply for chronic care management only during months in which we provide at least 20 minutes of these services. Most insurances cover these services at 100%, however patients may be responsible for any copay, coinsurance and/or deductible if applicable. This service may help you avoid the need for more expensive face-to-face services. Only one practitioner may furnish and bill the service in a calendar month. The patient may stop CCM services at any time (effective at the end of the month) by phone call to the office staff.  Patient agreed to services and verbal consent obtained.   The patient verbalized understanding of instructions, educational materials, and care plan provided today and DECLINED offer to receive copy of patient instructions, educational materials, and care plan.  The pharmacy team will reach out to the patient again over the next 90 days.   Lane Hacker, [place]

## 2021-07-15 NOTE — Chronic Care Management (AMB) (Cosign Needed)
Chronic Care Management    Clinical Social Work Note  07/15/2021 Name: Ozelle Brubacher MRN: [id number] DOB: 04-Feb-1970  Jameca Chumley is a 51 y.o. year old female who is a primary care patient of [name], Kirsten, MD. The CCM team was consulted to assist the patient with chronic disease management and/or care coordination needs related to: [employer]  and [employer].   Engaged with patient by telephone for follow up visit in response to provider referral for social work chronic care management and care coordination services.   Consent to Services:  The patient was given information about Chronic Care Management services, agreed to services, and gave verbal consent prior to initiation of services.  Please see initial visit note for detailed documentation.   Patient agreed to services and consent obtained.   Assessment: Review of patient past medical history, allergies, medications, and health status, including review of relevant consultants reports was performed today as part of a comprehensive evaluation and provision of chronic care management and care coordination services.     SDOH (Social Determinants of Health) assessments and interventions performed:    Advanced Directives Status: Not addressed in this encounter.  CCM Care Plan  Allergies  Allergen Reactions   Codeine Shortness Of Breath    Other reaction(s): SHOB   Celecoxib Other (See Comments)    Unknown reaction Other reaction(s): Unknown   Ezetimibe     Other reaction(s): Unknown   Ezetimibe-Simvastatin Other (See Comments)    Unknown reaction   Propranolol     Other reaction(s): Unknown   Propranolol Hcl Other (See Comments)    Unknown reaction   Simvastatin     Other reaction(s): Unknown    Outpatient Encounter Medications as of 07/15/2021  Medication Sig Note   amoxicillin-clavulanate (AUGMENTIN) 875-125 MG tablet Take 1 tablet by mouth 2 (two) times daily.     atorvastatin (LIPITOR) 10 MG tablet Take 1 tablet (10 mg total) by mouth daily.    benzonatate (TESSALON) 100 MG capsule Take 1 capsule (100 mg total) by mouth 2 (two) times daily as needed for cough.    Blood Glucose Monitoring Suppl (ONETOUCH VERIO REFLECT) w/Device KIT AS DIRECTED    buPROPion (WELLBUTRIN XL) 300 MG 24 hr tablet Take 1 tablet (300 mg total) by mouth every evening.    busPIRone (BUSPAR) 5 MG tablet Take 1 tablet (5 mg total) by mouth 3 (three) times daily.    calcium citrate-vitamin D (CITRACAL+D) 315-200 MG-UNIT tablet Take 1 tablet by mouth 2 (two) times daily.    clotrimazole-betamethasone (LOTRISONE) cream Apply twice daily as needed to area of concern    Continuous Blood Gluc Receiver (FREESTYLE LIBRE 2 READER) DEVI E11.69 Check blood sugar 4 times daily as directed    Continuous Blood Gluc Sensor (FREESTYLE LIBRE 2 SENSOR) MISC E11.69 Change sensor every 14 days as directed    cyclobenzaprine (FLEXERIL) 10 MG tablet TAKE ONE TABLET BY MOUTH EVERY 8 HOURS AS NEEDED FOR muscle SPASMS    dicyclomine (BENTYL) 20 MG tablet TAKE ONE TABLET BY MOUTH BEFORE MEALS AND AT BEDTIME AS NEEDED FOR STOMACH CRAMPING    Dulaglutide (TRULICITY) 4.5 BD/5.3GD SOPN Inject 4.5 mg as directed once a week.    Empagliflozin-metFORMIN HCl (SYNJARDY) 12.05-998 MG TABS Take 1 tablet by mouth 2 (two) times daily.    famotidine (PEPCID) 20 MG tablet Take 1 tablet (20 mg total) by mouth 2 (two) times daily.    ferrous sulfate 325 (65 FE) MG  tablet Take 325 mg by mouth every evening.    gabapentin (NEURONTIN) 300 MG capsule Take 2 capsules (600 mg total) by mouth 3 (three) times daily.    insulin degludec (TRESIBA FLEXTOUCH) 200 UNIT/ML FlexTouch Pen Inject 52 Units into the skin daily.    Levomilnacipran HCl ER (FETZIMA) 80 MG CP24 Take 1 capsule by mouth daily.    levothyroxine (SYNTHROID) 75 MCG tablet Take 1 tablet (75 mcg total) by mouth daily.    lisdexamfetamine (VYVANSE) 70 MG capsule Take 1  capsule (70 mg total) by mouth daily.    loratadine (CLARITIN) 10 MG tablet Take 10 mg by mouth daily. Taking 3-4 days each week due to bone pain associated with shot from cancer center.    losartan (COZAAR) 50 MG tablet Take 1 tablet (50 mg total) by mouth every evening.    Magnesium 500 MG CAPS 500 mg.    morphine (MS CONTIN) 30 MG 12 hr tablet Take 1 tablet (30 mg total) by mouth every 12 (twelve) hours.    Multiple Vitamin (MULTIVITAMIN WITH MINERALS) TABS tablet Take 1 tablet by mouth daily.  06/08/2018: Pt plans on purchasing again when she can get out.   omega-3 acid ethyl esters (LOVAZA) 1 g capsule Take 2 capsules (2 g total) by mouth 2 (two) times daily.    ondansetron (ZOFRAN) 4 MG tablet Take 1 tablet (4 mg total) by mouth every 4 (four) hours as needed for nausea.    OneTouch Delica Lancets 60F MISC 1 each by Does not apply route daily before breakfast. Check blood sugar twice daily.    pantoprazole (PROTONIX) 40 MG tablet Take 1 tablet (40 mg total) by mouth 2 (two) times daily.    potassium chloride (MICRO-K) 10 MEQ CR capsule Take 2 capsules (20 mEq total) by mouth 2 (two) times daily.    prochlorperazine (COMPAZINE) 10 MG tablet TAKE ONE TABLET BY MOUTH EVERY 6 HOURS AS NEEDED FOR NAUSEA AND VOMITING    rOPINIRole (REQUIP) 0.25 MG tablet Take 1 tablet (0.25 mg total) by mouth at bedtime.    SODIUM FLUORIDE 5000 SENSITIVE 1.1-5 % GEL Take by mouth 2 (two) times daily.    Specialty Vitamins Products (MAGNESIUM, AMINO ACID CHELATE,) 133 MG tablet Take 1 tablet by mouth at bedtime.    Vitamin D, Ergocalciferol, (DRISDOL) 1.25 MG (50000 UNIT) CAPS capsule Take one capsule by mouth weekly    zolpidem (AMBIEN) 10 MG tablet One before bed    Facility-Administered Encounter Medications as of 07/15/2021  Medication   sodium chloride flush (NS) 0.9 % injection 10 mL    Patient Active Problem List   Diagnosis Date Noted   Right hip pain 06/27/2021   Left hip pain 06/27/2021   Falls  frequently 06/23/2021   Weakness of both lower extremities 06/23/2021   Acute costochondritis 05/03/2021   Hypertension complicating diabetes ([place]) 03/23/2021   Type 2 diabetes mellitus with hyperglycemia ([place]) 01/20/2021   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   Binge eating disorder 11/23/2020   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 04/17/2020   Idiopathic thrombocytopenic purpura (ITP) ([place]) 01/27/2020   Malignant neoplasm of lower-inner quadrant of right breast of female, estrogen receptor negative ([place]) 10/01/2019   Chronic pain syndrome 07/08/2019   Depression, major, recurrent, mild ([place]) 09/32/3557   Uncomplicated opioid dependence ([place]) 07/08/2019   Mixed hyperlipidemia 04/11/2019   Dyslipidemia associated with type 2 diabetes mellitus ([place]) 04/11/2019   Acquired hypothyroidism 04/11/2019   Vitamin D insufficiency 04/11/2019   Class  2 severe obesity due to excess calories with serious comorbidity and body mass index (BMI) of 36.0 to 36.9 in adult [facility]) 04/11/2019   Liver cirrhosis secondary to NASH (nonalcoholic steatohepatitis) ([place]) 09/13/2018    Class: Chronic   BRCA1 positive 06/14/2016    Conditions to be addressed/monitored: Depression; Mental Health Concerns   Care Plan : LCSW Plan of Care  Updates made by Deirdre Peer, LCSW since 07/15/2021 12:00 AM     Problem: Symptoms (Depression)      Long-Range Goal: Reduce symptoms of depression and connect with community/activities to improve QOL   Start Date: 07/01/2021  Expected End Date: 09/22/2021  This Visit's Progress: On track  Recent Progress: On track  Priority: High  Note:   Current Barriers:  Limited social support, Transportation, Limited access to food, Mental Health Concerns , Social Isolation, and Lacks knowledge of community resource:     CSW Clinical Goal(s):  Patient  will explore community resource options for unmet needs related to:  Transportation, Sales promotion account executive , Landscape architect ,  Depression  , and Social Connections through collaboration with [place] representative, provider, and care team.   Interventions: CSW made contact with pt today who shared her recent dog bite and visit to PCP due to cough- pt is now on ABX for this and is feeling better.  Pt reports her depression is better- "not too bad". she feels being active has helped. "I've been going out with a friend to eat and to shop".  She also helps a neighbor friend with ADL's, etc.  CSW discussed her HTA benefits and encouraged her to review and consider "silver sneakers" program or similar program at the "[place]".       07/01/2021    2:19 PM 06/23/2021    7:40 AM 03/23/2021    8:36 AM 01/20/2021    7:59 AM 11/16/2020   10:49 AM  Depression screen PHQ 2/9  Decreased Interest 3 0 0 0 2  Down, Depressed, Hopeless 1 0 0 1 1  PHQ - 2 Score 4 0 0 1 3  Altered sleeping 3  1 0 1  Tired, decreased energy 3  1 1  0  Change in appetite 3  1 0 3  Feeling bad or failure about yourself  3  0 1 1  Trouble concentrating 3  0 1 2  Moving slowly or fidgety/restless 2  0 0 3  Suicidal thoughts 0  0 0 0  PHQ-9 Score 21  3 4 13   Difficult doing work/chores Somewhat difficult  Not difficult at all Not difficult at all Somewhat difficult     1:1 collaboration with primary care provider regarding development and update of comprehensive plan of care as evidenced by provider attestation and co-signature Inter-disciplinary care team collaboration (see longitudinal plan of care) Evaluation of current treatment plan related to  self management and patient's adherence to plan as established by provider Review resources, discussed options and provided patient information about  Community food options  Referral to care guide ( )    Mental Health:  (Status: New goal.) Evaluation of current treatment plan related to Depression: depressed mood anxiety hopelessness loss of energy/fatigue anhedonia insomnia disturbed  sleep increased appetite and Eating Disorder: "binge eating" Depression screen reviewed  PHQ2/ PHQ9 completed Solution-Focused Strategies employed:  Mindfulness or Relaxation training provided Active listening / Reflection utilized  Emotional Support Provided Problem Solving /Task Center strategies reviewed Provided psychoeducation for mental health needs  Reviewed mental health medications  and discussed importance of compliance:    Task & activities to accomplish goals: -call [place] to schedule visit -review info emailed to you about the HTA benefits you have (dental, etc)  - keep 90 percent of counseling appointments - schedule counseling appointment  -review resource material mailed/emailed to you and consider connecting             Follow Up Plan: Appointment scheduled for SW follow up with client by phone on: 08/12/21      Eduard Clos MSW, LCSW Licensed Clinical Social Worker [place]   [phone]

## 2021-07-16 ENCOUNTER — Other Ambulatory Visit: Payer: Self-pay

## 2021-07-16 ENCOUNTER — Telehealth: Payer: Self-pay

## 2021-07-16 DIAGNOSIS — Z1501 Genetic susceptibility to malignant neoplasm of breast: Secondary | ICD-10-CM

## 2021-07-16 MED ORDER — POTASSIUM CHLORIDE ER 10 MEQ PO CPCR: 20.0000 meq | ORAL_CAPSULE | Freq: Two times a day (BID) | ORAL | 0 refills | Status: DC

## 2021-07-16 MED ORDER — GABAPENTIN 300 MG PO CAPS: 600.0000 mg | ORAL_CAPSULE | Freq: Three times a day (TID) | ORAL | 0 refills | Status: DC

## 2021-07-16 MED ORDER — MORPHINE SULFATE ER 30 MG PO TBCR: 30.0000 mg | EXTENDED_RELEASE_TABLET | Freq: Two times a day (BID) | ORAL | 0 refills | Status: DC

## 2021-07-19 NOTE — Telephone Encounter (Signed)
Compliant on meds 

## 2021-07-23 ENCOUNTER — Other Ambulatory Visit: Payer: Self-pay | Admitting: Family Medicine

## 2021-07-23 DIAGNOSIS — E785 Hyperlipidemia, unspecified: Secondary | ICD-10-CM

## 2021-07-23 DIAGNOSIS — E1159 Type 2 diabetes mellitus with other circulatory complications: Secondary | ICD-10-CM

## 2021-07-23 DIAGNOSIS — Z794 Long term (current) use of insulin: Secondary | ICD-10-CM

## 2021-07-23 DIAGNOSIS — I10 Essential (primary) hypertension: Secondary | ICD-10-CM | POA: Diagnosis not present

## 2021-07-23 DIAGNOSIS — F5081 Binge eating disorder: Secondary | ICD-10-CM

## 2021-07-23 DIAGNOSIS — F32A Depression, unspecified: Secondary | ICD-10-CM

## 2021-07-29 ENCOUNTER — Ambulatory Visit (INDEPENDENT_AMBULATORY_CARE_PROVIDER_SITE_OTHER): Payer: HMO | Admitting: Family Medicine

## 2021-07-29 VITALS — BP 140/70 | HR 92 | Temp 97.1°F | Resp 18 | Ht 62.0 in | Wt 216.4 lb

## 2021-07-29 DIAGNOSIS — J0100 Acute maxillary sinusitis, unspecified: Secondary | ICD-10-CM | POA: Insufficient documentation

## 2021-07-29 DIAGNOSIS — J208 Acute bronchitis due to other specified organisms: Secondary | ICD-10-CM | POA: Insufficient documentation

## 2021-07-29 MED ORDER — AZITHROMYCIN 250 MG PO TABS: ORAL_TABLET | ORAL | 0 refills | Status: AC

## 2021-07-29 NOTE — Progress Notes (Signed)
Acute Office Visit  Subjective:    Patient ID: Linda [name], female    DOB: 1970/03/28, 51 y.o.   MRN: [id number]  Chief Complaint  Patient presents with   Cough    HPI: Patient is in today for persistent cough.  Patient was seen on July 14, 2020 by Adron Bene, PA-C.  She was given Rocephin 1 g IM and Augmentin 875/125 mg twice daily for 10 days.  In addition she had Tessalon perles. Mucinex dm which wasn't helping much either. Patient tested negative for covid 19 x 2 before she saw Gay Filler.  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 No longer has    Past Surgical History:  Procedure Laterality Date   APPENDECTOMY     BACK SURGERY     x 3 lower disc   BILATERAL TOTAL MASTECTOMY WITH AXILLARY LYMPH NODE DISSECTION Bilateral 09/2019   CHOLECYSTECTOMY     nephrolithiasis     ROBOTIC ASSISTED TOTAL HYSTERECTOMY WITH BILATERAL SALPINGO OOPHERECTOMY Bilateral 06/14/2016   Procedure: ROBOTIC  ASSISTED TOTAL HYSTERECTOMY WITH BILATERAL SALPINGO OOPHORECTOMY;  Surgeon: Nancy Marus, MD;  Location: [facility];  Service: Gynecology;  Laterality: Bilateral;    Family History  Problem Relation Age of Onset   Cancer Mother        breast   CAD Father    Diabetes Father    Heart failure Father    Cancer Father        renal carcinoma   Kidney failure Father    Cancer Brother 8       renal carcinoma.   Stroke Paternal Grandmother     Social History   Socioeconomic History   Marital status: Divorced    Spouse name: Not on file   Number of children: Not on file   Years of education: Not on file   Highest education level: Not on file  Occupational History   Occupation: disabled  Tobacco Use   Smoking status: Never   Smokeless tobacco: Never  Substance and Sexual Activity   Alcohol use: No   Drug use: No   Sexual activity: Yes  Other Topics Concern   Not on file  Social History Narrative   wears sunscreen, brushes and flosses daily, see's dentist bi-annually, has smoke/carbon monoxide detectors, wears a seatbelt and practices gun safety   Social  Determinants of Health   Financial Resource Strain: High Risk (07/15/2021)   Overall Financial Resource Strain (CARDIA)    Difficulty of Paying Living Expenses: Hard  Food Insecurity: Food Insecurity Present (06/30/2021)   Hunger Vital Sign    Worried About Running Out of Food in the Last Year: Often true    Ran Out of Food in the Last Year: Often true  Transportation Needs: Unmet Transportation Needs (07/15/2021)   PRAPARE - Transportation    Lack of Transportation (Medical): Yes    Lack of Transportation (Non-Medical): Yes  Physical Activity: Not on file  Stress: Stress Concern Present (07/01/2021)   [place]    Feeling of Stress : To some extent  Social Connections: Not on file  Intimate Partner Violence: Not on file    Outpatient Medications Prior to  Visit  Medication Sig Dispense Refill   atorvastatin (LIPITOR) 10 MG tablet Take 1 tablet (10 mg total) by mouth daily. 90 tablet 0   benzonatate (TESSALON) 100 MG capsule Take 1 capsule (100 mg total) by mouth 2 (two) times daily as needed for cough. 30 capsule 0   Blood Glucose Monitoring Suppl (ONETOUCH VERIO REFLECT) w/Device KIT AS DIRECTED     buPROPion (WELLBUTRIN XL) 300 MG 24 hr tablet Take 1 tablet (300 mg total) by mouth every evening. 90 tablet 1   busPIRone (BUSPAR) 5 MG tablet Take 1 tablet (5 mg total) by mouth 3 (three) times daily. 90 tablet 2   calcium citrate-vitamin D (CITRACAL+D) 315-200 MG-UNIT tablet Take 1 tablet by mouth 2 (two) times daily.     clotrimazole-betamethasone (LOTRISONE) cream Apply twice daily as needed to area of concern 45 g 2   Continuous Blood Gluc Receiver (FREESTYLE LIBRE 2 READER) DEVI E11.69 Check blood sugar 4 times daily as directed 1 each 0   Continuous Blood Gluc Sensor (FREESTYLE LIBRE 2 SENSOR) MISC E11.69 Change sensor every 14 days as directed 6 each 3   cyclobenzaprine (FLEXERIL) 10 MG tablet TAKE ONE TABLET BY MOUTH EVERY 8 HOURS AS NEEDED FOR muscle SPASMS 30 tablet 3   dicyclomine (BENTYL) 20 MG tablet TAKE ONE TABLET BY MOUTH BEFORE MEALS AND AT BEDTIME AS NEEDED FOR STOMACH CRAMPING 180 tablet 1   Dulaglutide (TRULICITY) 4.5 JH/4.1DE SOPN Inject 4.5 mg as directed once a week. 6 mL 0   Empagliflozin-metFORMIN HCl (SYNJARDY) 12.05-998 MG TABS Take 1 tablet by mouth 2 (two) times daily. 180 tablet 0   famotidine (PEPCID) 20 MG tablet Take 1 tablet (20 mg total) by mouth 2 (two) times daily. 180 tablet 1   ferrous sulfate 325 (65 FE) MG tablet Take 325 mg by mouth every evening.     gabapentin (NEURONTIN) 300 MG capsule Take 2 capsules (600 mg total) by mouth 3 (three) times daily. 180 capsule 0   insulin degludec (TRESIBA FLEXTOUCH) 200 UNIT/ML FlexTouch Pen Inject 52 Units into the skin daily. 9 mL 3   Levomilnacipran HCl ER (FETZIMA) 80  MG CP24 Take 1 capsule by mouth daily. 90 capsule 1   levothyroxine (SYNTHROID) 75 MCG tablet Take 1 tablet (75 mcg total) by mouth daily. 90 tablet 1   loratadine (CLARITIN) 10 MG tablet Take 10 mg by mouth daily. Taking 3-4 days each week due to bone pain associated with shot from cancer center.     losartan (COZAAR) 50 MG tablet Take 1 tablet (50 mg total) by mouth every evening. 90 tablet 1  Magnesium 500 MG CAPS 500 mg.     morphine (MS CONTIN) 30 MG 12 hr tablet Take 1 tablet (30 mg total) by mouth every 12 (twelve) hours. 60 tablet 0   Multiple Vitamin (MULTIVITAMIN WITH MINERALS) TABS tablet Take 1 tablet by mouth daily.      omega-3 acid ethyl esters (LOVAZA) 1 g capsule Take 2 capsules (2 g total) by mouth 2 (two) times daily. 360 capsule 1   ondansetron (ZOFRAN) 4 MG tablet Take 1 tablet (4 mg total) by mouth every 4 (four) hours as needed for nausea. 90 tablet 3   OneTouch Delica Lancets 21Y MISC 1 each by Does not apply route daily before breakfast. Check blood sugar twice daily. 100 each 3   pantoprazole (PROTONIX) 40 MG tablet Take 1 tablet (40 mg total) by mouth 2 (two) times daily. 180 tablet 1   potassium chloride (MICRO-K) 10 MEQ CR capsule Take 2 capsules (20 mEq total) by mouth 2 (two) times daily. 360 capsule 0   prochlorperazine (COMPAZINE) 10 MG tablet TAKE ONE TABLET BY MOUTH EVERY 6 HOURS AS NEEDED FOR NAUSEA AND VOMITING 90 tablet 0   rOPINIRole (REQUIP) 0.25 MG tablet Take 1 tablet (0.25 mg total) by mouth at bedtime. 90 tablet 1   SODIUM FLUORIDE 5000 SENSITIVE 1.1-5 % GEL Take by mouth 2 (two) times daily.     Specialty Vitamins Products (MAGNESIUM, AMINO ACID CHELATE,) 133 MG tablet Take 1 tablet by mouth at bedtime.     Vitamin D, Ergocalciferol, (DRISDOL) 1.25 MG (50000 UNIT) CAPS capsule Take one capsule by mouth weekly 12 capsule 1   VYVANSE 70 MG capsule TAKE ONE CAPSULE BY MOUTH ONCE daily 30 capsule 0   zolpidem (AMBIEN) 10 MG tablet One before bed 30 tablet 5    amoxicillin-clavulanate (AUGMENTIN) 875-125 MG tablet Take 1 tablet by mouth 2 (two) times daily. (Patient not taking: Reported on 07/19/2021) 20 tablet 0   Facility-Administered Medications Prior to Visit  Medication Dose Route Frequency Provider Last Rate Last Admin   sodium chloride flush (NS) 0.9 % injection 10 mL  10 mL Intracatheter PRN Mosher, Kelli A, PA-C   10 mL at 02/19/21 1004    Allergies  Allergen Reactions   Codeine Shortness Of Breath    Other reaction(s): SHOB   Celecoxib Other (See Comments)    Unknown reaction Other reaction(s): Unknown   Ezetimibe     Other reaction(s): Unknown   Ezetimibe-Simvastatin Other (See Comments)    Unknown reaction   Propranolol     Other reaction(s): Unknown   Propranolol Hcl Other (See Comments)    Unknown reaction   Simvastatin     Other reaction(s): Unknown    Review of Systems  Constitutional:  Positive for fatigue. Negative for chills and fever.  HENT:  Positive for congestion, postnasal drip and voice change. Negative for ear pain.   Respiratory:  Positive for cough, chest tightness and wheezing. Negative for shortness of breath.   Neurological:  Positive for headaches.       Objective:    Physical Exam Vitals reviewed.  Constitutional:      Appearance: Normal appearance.  HENT:     Right Ear: Tympanic membrane, ear canal and external ear normal.     Left Ear: Tympanic membrane, ear canal and external ear normal.     Nose: Congestion (nasal turbinates erythematous.) present.     Mouth/Throat:     Pharynx: Oropharynx is clear. No oropharyngeal exudate or posterior oropharyngeal  erythema.     Comments: hoarse Cardiovascular:     Rate and Rhythm: Normal rate and regular rhythm.     Heart sounds: Normal heart sounds. No murmur heard. Pulmonary:     Effort: Pulmonary effort is normal. No respiratory distress.     Breath sounds: Normal breath sounds.  Lymphadenopathy:     Cervical: No cervical adenopathy.   Neurological:     Mental Status: She is alert and oriented to person, place, and time.  Psychiatric:        Mood and Affect: Mood normal.        Behavior: Behavior normal.     BP 140/70   Pulse 92   Temp (!) 97.1 F (36.2 C)   Resp 18   Ht _0  (1.575 m)   Wt 216 lb 6.4 oz (98.2 kg)   LMP 06/13/2009   BMI 39.58 kg/m  Wt Readings from Last 3 Encounters:  07/29/21 216 lb 6.4 oz (98.2 kg)  07/14/21 210 lb (95.3 kg)  06/25/21 202 lb 14.4 oz (92 kg)    Health Maintenance Due  Topic Date Due   Zoster Vaccines- Shingrix (1 of 2) Never done   OPHTHALMOLOGY EXAM  04/30/2021    There are no preventive care reminders to display for this patient.   Lab Results  Component Value Date   TSH 1.700 06/23/2021   Lab Results  Component Value Date   WBC 4.5 06/25/2021   HGB 11.6 (A) 06/25/2021   HCT 37 06/25/2021   MCV 81 06/25/2021   PLT 74 (A) 06/25/2021   Lab Results  Component Value Date   NA 141 06/25/2021   K 4.4 06/25/2021   CO2 30 (A) 06/25/2021   GLUCOSE 145 (H) 06/23/2021   BUN 16 06/25/2021   CREATININE 0.8 06/25/2021   BILITOT 0.6 06/23/2021   ALKPHOS 82 06/25/2021   AST 30 06/25/2021   ALT 30 06/25/2021   PROT 6.7 06/23/2021   ALBUMIN 4.1 06/25/2021   CALCIUM 9.1 06/25/2021   ANIONGAP 9 11/27/2019   EGFR 61 06/23/2021   Lab Results  Component Value Date   CHOL 145 06/23/2021   Lab Results  Component Value Date   HDL 50 06/23/2021   Lab Results  Component Value Date   LDLCALC 69 06/23/2021   Lab Results  Component Value Date   TRIG 150 (H) 06/23/2021   Lab Results  Component Value Date   CHOLHDL 2.9 06/23/2021   Lab Results  Component Value Date   HGBA1C 7.6 (H) 06/23/2021       Assessment & Plan:   Problem List Items Addressed This Visit       Respiratory   Acute bronchitis due to other specified organisms - Primary    Rx: delsym.      Relevant Medications   azithromycin (ZITHROMAX) 250 MG tablet   Acute non-recurrent  maxillary sinusitis   Relevant Medications   azithromycin (ZITHROMAX) 250 MG tablet   Meds ordered this encounter  Medications   azithromycin (ZITHROMAX) 250 MG tablet    Sig: Take 2 tablets on day 1, then 1 tablet daily on days 2 through 5    Dispense:  6 tablet    Refill:  0     Follow-up: Return if symptoms worsen or fail to improve.  An After Visit Summary was printed and given to the patient.  [name], MD [facility] [phone]

## 2021-07-31 DIAGNOSIS — R0602 Shortness of breath: Secondary | ICD-10-CM | POA: Diagnosis not present

## 2021-07-31 DIAGNOSIS — J209 Acute bronchitis, unspecified: Secondary | ICD-10-CM | POA: Diagnosis not present

## 2021-07-31 DIAGNOSIS — R1310 Dysphagia, unspecified: Secondary | ICD-10-CM | POA: Diagnosis not present

## 2021-07-31 DIAGNOSIS — R059 Cough, unspecified: Secondary | ICD-10-CM | POA: Diagnosis not present

## 2021-07-31 DIAGNOSIS — R0600 Dyspnea, unspecified: Secondary | ICD-10-CM | POA: Diagnosis not present

## 2021-07-31 DIAGNOSIS — R9389 Abnormal findings on diagnostic imaging of other specified body structures: Secondary | ICD-10-CM | POA: Diagnosis not present

## 2021-08-02 ENCOUNTER — Encounter: Payer: Self-pay | Admitting: Family Medicine

## 2021-08-02 NOTE — Assessment & Plan Note (Signed)
Rx: delsym.

## 2021-08-05 ENCOUNTER — Encounter: Payer: Self-pay | Admitting: Hematology and Oncology

## 2021-08-10 ENCOUNTER — Ambulatory Visit: Payer: HMO

## 2021-08-10 ENCOUNTER — Telehealth: Payer: Self-pay

## 2021-08-10 DIAGNOSIS — D500 Iron deficiency anemia secondary to blood loss (chronic): Secondary | ICD-10-CM | POA: Diagnosis not present

## 2021-08-10 DIAGNOSIS — K7460 Unspecified cirrhosis of liver: Secondary | ICD-10-CM | POA: Diagnosis not present

## 2021-08-10 DIAGNOSIS — K219 Gastro-esophageal reflux disease without esophagitis: Secondary | ICD-10-CM | POA: Diagnosis not present

## 2021-08-10 DIAGNOSIS — K5903 Drug induced constipation: Secondary | ICD-10-CM | POA: Diagnosis not present

## 2021-08-10 DIAGNOSIS — R1314 Dysphagia, pharyngoesophageal phase: Secondary | ICD-10-CM | POA: Diagnosis not present

## 2021-08-10 DIAGNOSIS — T402X5A Adverse effect of other opioids, initial encounter: Secondary | ICD-10-CM | POA: Diagnosis not present

## 2021-08-10 MED ORDER — LOSARTAN POTASSIUM 100 MG PO TABS: 100.0000 mg | ORAL_TABLET | Freq: Every day | ORAL | 0 refills | Status: DC

## 2021-08-10 NOTE — Telephone Encounter (Signed)
Patient called with concerns about her BP being high stating that she is still coughing as well. She states that Her chest was hurting but like a sore feeling right at the sternum and stated that she took her BP and it was 165/99, five minutes later it was 159/96, then five minutes afterwards it was 165/90. Patient stated that she was concerned about the readings and wanted to know what she needed to do. Dr. Tobie Poet was consulted with and she stated that she would recommend that the patient increase her Losartan to 45m BID or a total of 1029mdaily. Patient agreed with changes and was also instructed to keep a log of her BP readings for the next two weeks to ensure BP is trending downward and if the patient has any other questions to give [place] call back.

## 2021-08-12 ENCOUNTER — Encounter: Payer: Self-pay | Admitting: Podiatry

## 2021-08-12 ENCOUNTER — Ambulatory Visit: Payer: PPO | Admitting: Podiatry

## 2021-08-12 ENCOUNTER — Ambulatory Visit (INDEPENDENT_AMBULATORY_CARE_PROVIDER_SITE_OTHER): Payer: HMO | Admitting: *Deleted

## 2021-08-12 DIAGNOSIS — E1165 Type 2 diabetes mellitus with hyperglycemia: Secondary | ICD-10-CM

## 2021-08-12 DIAGNOSIS — M79674 Pain in right toe(s): Secondary | ICD-10-CM | POA: Diagnosis not present

## 2021-08-12 DIAGNOSIS — M79675 Pain in left toe(s): Secondary | ICD-10-CM

## 2021-08-12 DIAGNOSIS — D6959 Other secondary thrombocytopenia: Secondary | ICD-10-CM

## 2021-08-12 DIAGNOSIS — L84 Corns and callosities: Secondary | ICD-10-CM | POA: Diagnosis not present

## 2021-08-12 DIAGNOSIS — F5081 Binge eating disorder: Secondary | ICD-10-CM

## 2021-08-12 DIAGNOSIS — L03113 Cellulitis of right upper limb: Secondary | ICD-10-CM

## 2021-08-12 DIAGNOSIS — T451X5A Adverse effect of antineoplastic and immunosuppressive drugs, initial encounter: Secondary | ICD-10-CM

## 2021-08-12 DIAGNOSIS — B351 Tinea unguium: Secondary | ICD-10-CM | POA: Diagnosis not present

## 2021-08-12 DIAGNOSIS — J208 Acute bronchitis due to other specified organisms: Secondary | ICD-10-CM

## 2021-08-12 DIAGNOSIS — Z794 Long term (current) use of insulin: Secondary | ICD-10-CM | POA: Diagnosis not present

## 2021-08-12 DIAGNOSIS — F330 Major depressive disorder, recurrent, mild: Secondary | ICD-10-CM

## 2021-08-12 NOTE — Chronic Care Management (AMB) (Signed)
Chronic Care Management    Clinical Social Work Note  08/12/2021 Name: Linda [name] MRN: [id number] DOB: [birth year]  Linda [name] is a 51 y.o. year old female who is a primary care patient of [name], Kirsten, MD. The CCM team was consulted to assist the patient with chronic disease management and/or care coordination needs related to: [employer]  and [employer].   Engaged with patient by telephone for follow up visit in response to provider referral for social work chronic care management and care coordination services.   Consent to Services:  The patient was given information about Chronic Care Management services, agreed to services, and gave verbal consent prior to initiation of services.  Please see initial visit note for detailed documentation.   Patient agreed to services and consent obtained.   Assessment: Review of patient past medical history, allergies, medications, and health status, including review of relevant consultants reports was performed today as part of a comprehensive evaluation and provision of chronic care management and care coordination services.     SDOH (Social Determinants of Health) assessments and interventions performed:    Advanced Directives Status: Not addressed in this encounter.  CCM Care Plan  Allergies  Allergen Reactions   Codeine Shortness Of Breath    Other reaction(s): SHOB   Celecoxib Other (See Comments)    Unknown reaction Other reaction(s): Unknown   Ezetimibe     Other reaction(s): Unknown   Ezetimibe-Simvastatin Other (See Comments)    Unknown reaction   Propranolol     Other reaction(s): Unknown   Propranolol Hcl Other (See Comments)    Unknown reaction   Simvastatin     Other reaction(s): Unknown    Outpatient Encounter Medications as of 08/12/2021  Medication Sig Note   atorvastatin (LIPITOR) 10 MG tablet Take 1 tablet (10 mg total) by mouth daily.    benzonatate  (TESSALON) 100 MG capsule Take 1 capsule (100 mg total) by mouth 2 (two) times daily as needed for cough.    Blood Glucose Monitoring Suppl (ONETOUCH VERIO REFLECT) w/Device KIT AS DIRECTED    buPROPion (WELLBUTRIN XL) 300 MG 24 hr tablet Take 1 tablet (300 mg total) by mouth every evening.    busPIRone (BUSPAR) 5 MG tablet Take 1 tablet (5 mg total) by mouth 3 (three) times daily.    calcium citrate-vitamin D (CITRACAL+D) 315-200 MG-UNIT tablet Take 1 tablet by mouth 2 (two) times daily.    clotrimazole-betamethasone (LOTRISONE) cream Apply twice daily as needed to area of concern    Continuous Blood Gluc Receiver (FREESTYLE LIBRE 2 READER) DEVI E11.69 Check blood sugar 4 times daily as directed    Continuous Blood Gluc Sensor (FREESTYLE LIBRE 2 SENSOR) MISC E11.69 Change sensor every 14 days as directed    cyclobenzaprine (FLEXERIL) 10 MG tablet TAKE ONE TABLET BY MOUTH EVERY 8 HOURS AS NEEDED FOR muscle SPASMS    dicyclomine (BENTYL) 20 MG tablet TAKE ONE TABLET BY MOUTH BEFORE MEALS AND AT BEDTIME AS NEEDED FOR STOMACH CRAMPING    Dulaglutide (TRULICITY) 4.5 WU/9.8JX SOPN Inject 4.5 mg as directed once a week.    Empagliflozin-metFORMIN HCl (SYNJARDY) 12.05-998 MG TABS Take 1 tablet by mouth 2 (two) times daily.    famotidine (PEPCID) 20 MG tablet Take 1 tablet (20 mg total) by mouth 2 (two) times daily.    ferrous sulfate 325 (65 FE) MG tablet Take 325 mg by mouth every evening.    gabapentin (NEURONTIN) 300 MG capsule Take  2 capsules (600 mg total) by mouth 3 (three) times daily.    insulin degludec (TRESIBA FLEXTOUCH) 200 UNIT/ML FlexTouch Pen Inject 52 Units into the skin daily.    Levomilnacipran HCl ER (FETZIMA) 80 MG CP24 Take 1 capsule by mouth daily.    levothyroxine (SYNTHROID) 75 MCG tablet Take 1 tablet (75 mcg total) by mouth daily.    loratadine (CLARITIN) 10 MG tablet Take 10 mg by mouth daily. Taking 3-4 days each week due to bone pain associated with shot from cancer center.     losartan (COZAAR) 100 MG tablet Take 1 tablet (100 mg total) by mouth daily.    Magnesium 500 MG CAPS 500 mg.    morphine (MS CONTIN) 30 MG 12 hr tablet Take 1 tablet (30 mg total) by mouth every 12 (twelve) hours.    Multiple Vitamin (MULTIVITAMIN WITH MINERALS) TABS tablet Take 1 tablet by mouth daily.  06/08/2018: Pt plans on purchasing again when she can get out.   omega-3 acid ethyl esters (LOVAZA) 1 g capsule Take 2 capsules (2 g total) by mouth 2 (two) times daily.    ondansetron (ZOFRAN) 4 MG tablet Take 1 tablet (4 mg total) by mouth every 4 (four) hours as needed for nausea.    OneTouch Delica Lancets 08X MISC 1 each by Does not apply route daily before breakfast. Check blood sugar twice daily.    pantoprazole (PROTONIX) 40 MG tablet Take 1 tablet (40 mg total) by mouth 2 (two) times daily.    potassium chloride (MICRO-K) 10 MEQ CR capsule Take 2 capsules (20 mEq total) by mouth 2 (two) times daily.    prochlorperazine (COMPAZINE) 10 MG tablet TAKE ONE TABLET BY MOUTH EVERY 6 HOURS AS NEEDED FOR NAUSEA AND VOMITING    rOPINIRole (REQUIP) 0.25 MG tablet Take 1 tablet (0.25 mg total) by mouth at bedtime.    SODIUM FLUORIDE 5000 SENSITIVE 1.1-5 % GEL Take by mouth 2 (two) times daily.    Specialty Vitamins Products (MAGNESIUM, AMINO ACID CHELATE,) 133 MG tablet Take 1 tablet by mouth at bedtime.    Vitamin D, Ergocalciferol, (DRISDOL) 1.25 MG (50000 UNIT) CAPS capsule Take one capsule by mouth weekly    VYVANSE 70 MG capsule TAKE ONE CAPSULE BY MOUTH ONCE daily    zolpidem (AMBIEN) 10 MG tablet One before bed    Facility-Administered Encounter Medications as of 08/12/2021  Medication   sodium chloride flush (NS) 0.9 % injection 10 mL    Patient Active Problem List   Diagnosis Date Noted   Acute non-recurrent maxillary sinusitis 07/29/2021   Acute bronchitis due to other specified organisms 07/29/2021   Right hip pain 06/27/2021   Left hip pain 06/27/2021   Falls frequently  06/23/2021   Weakness of both lower extremities 06/23/2021   Acute costochondritis 05/03/2021   Hypertension complicating diabetes ([place]) 03/23/2021   Type 2 diabetes mellitus with hyperglycemia ([place]) 01/20/2021   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   Binge eating disorder 11/23/2020   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 04/17/2020   Idiopathic thrombocytopenic purpura (ITP) ([place]) 01/27/2020   Malignant neoplasm of lower-inner quadrant of right breast of female, estrogen receptor negative ([place]) 10/01/2019   Chronic pain syndrome 07/08/2019   Depression, major, recurrent, mild ([place]) 44/81/8563   Uncomplicated opioid dependence ([place]) 07/08/2019   Mixed hyperlipidemia 04/11/2019   Dyslipidemia associated with type 2 diabetes mellitus ([place]) 04/11/2019   Acquired hypothyroidism 04/11/2019   Vitamin D insufficiency 04/11/2019   Class 2 severe obesity due  to excess calories with serious comorbidity and body mass index (BMI) of 36.0 to 36.9 in adult [facility]) 04/11/2019   Liver cirrhosis secondary to NASH (nonalcoholic steatohepatitis) ([place]) 09/13/2018    Class: Chronic   BRCA1 positive 06/14/2016    Conditions to be addressed/monitored: DMII and Depression; Mental Health Concerns   Care Plan : LCSW Plan of Care  Updates made by Deirdre Peer, LCSW since 08/12/2021 12:00 AM     Problem: Symptoms (Depression)      Long-Range Goal: Reduce symptoms of depression and connect with community/activities to improve QOL Completed 08/12/2021  Start Date: 07/01/2021  Expected End Date: 09/22/2021  Recent Progress: On track  Priority: High  Note:   Current Barriers:  Limited social support, Transportation, Limited access to food, Mental Health Concerns , Social Isolation, and Lacks knowledge of community resource:     CSW Clinical Goal(s):  Patient  will explore community resource options for unmet needs related to:  Transportation, Sales promotion account executive , Landscape architect , Depression  , and  Social Connections through collaboration with [place] representative, provider, and care team.   Interventions: CSW made contact with pt today who has been sick with bronchitis like symptoms- "been on antibiotics and then a steroid shot". She has not felt up to looking into the  Pt reports her depression is minimal- has a [age] next week!  Pt still planning to consider "silver sneakers" program or similar program at the "[facility]".  Pt advised of plans to follow up with her via Care Coordination in 6 weeks.       07/01/2021    2:19 PM 06/23/2021    7:40 AM 03/23/2021    8:36 AM 01/20/2021    7:59 AM 11/16/2020   10:49 AM  Depression screen PHQ 2/9  Decreased Interest 3 0 0 0 2  Down, Depressed, Hopeless 1 0 0 1 1  PHQ - 2 Score 4 0 0 1 3  Altered sleeping 3  1 0 1  Tired, decreased energy _0 0  Change in appetite 3  1 0 3  Feeling bad or failure about yourself  3  0 1 1  Trouble concentrating 3  0 1 2  Moving slowly or fidgety/restless 2  0 0 3  Suicidal thoughts 0  0 0 0  PHQ-9 Score _1 Difficult doing work/chores Somewhat difficult  Not difficult at all Not difficult at all Somewhat difficult     1:1 collaboration with primary care provider regarding development and update of comprehensive plan of care as evidenced by provider attestation and co-signature Inter-disciplinary care team collaboration (see longitudinal plan of care) Evaluation of current treatment plan related to  self management and patient's adherence to plan as established by provider Review resources, discussed options and provided patient information about  Community food options  Referral to care guide ( )    Mental Health:  (Status: New goal.) Evaluation of current treatment plan related to Depression: depressed mood anxiety hopelessness loss of energy/fatigue anhedonia insomnia disturbed sleep increased appetite and Eating Disorder: "binge eating" Depression screen reviewed  PHQ2/ PHQ9  completed Solution-Focused Strategies employed:  Mindfulness or Relaxation training provided Active listening / Reflection utilized  Emotional Support Provided Problem Solving /Task Center strategies reviewed Provided psychoeducation for mental health needs  Reviewed mental health medications and discussed importance of compliance:    Task & activities to accomplish goals: -call [facility] to schedule visit -review info  emailed to you about the HTA benefits you have (dental, etc)  - keep 90 percent of counseling appointments - schedule counseling appointment  -review resource material mailed/emailed to you and consider connecting             Follow Up Plan: Appointment scheduled for SW follow up with client by phone on: 09/23/21      Eduard Clos MSW, LCSW Licensed Clinical Social Worker [place]   [phone]

## 2021-08-16 ENCOUNTER — Telehealth: Payer: Self-pay

## 2021-08-16 ENCOUNTER — Other Ambulatory Visit: Payer: Self-pay

## 2021-08-16 DIAGNOSIS — F5081 Binge eating disorder: Secondary | ICD-10-CM

## 2021-08-16 DIAGNOSIS — Z1501 Genetic susceptibility to malignant neoplasm of breast: Secondary | ICD-10-CM

## 2021-08-16 MED ORDER — ATORVASTATIN CALCIUM 10 MG PO TABS: 10.0000 mg | ORAL_TABLET | Freq: Every day | ORAL | 0 refills | Status: DC

## 2021-08-16 MED ORDER — LISDEXAMFETAMINE DIMESYLATE 70 MG PO CAPS: 70.0000 mg | ORAL_CAPSULE | Freq: Every day | ORAL | 0 refills | Status: DC

## 2021-08-16 MED ORDER — GABAPENTIN 300 MG PO CAPS: 600.0000 mg | ORAL_CAPSULE | Freq: Three times a day (TID) | ORAL | 0 refills | Status: DC

## 2021-08-16 MED ORDER — ONDANSETRON HCL 4 MG PO TABS: 4.0000 mg | ORAL_TABLET | ORAL | 3 refills | Status: DC | PRN

## 2021-08-16 MED ORDER — SYNJARDY 12.5-1000 MG PO TABS
1.0000 | ORAL_TABLET | Freq: Two times a day (BID) | ORAL | 0 refills | Status: DC
Start: 2021-08-16 — End: 2021-11-16

## 2021-08-16 MED ORDER — MORPHINE SULFATE ER 30 MG PO TBCR: 30.0000 mg | EXTENDED_RELEASE_TABLET | Freq: Two times a day (BID) | ORAL | 0 refills | Status: DC

## 2021-08-16 NOTE — Chronic Care Management (AMB) (Signed)
Chronic Care Management Pharmacy Assistant   Name: Linda [name]  MRN: [id number] DOB: September 18, 1970   Reason for Encounter: Medication Review/ Medication coordination  Recent office visits:  08-12-2021 Deirdre Peer, LCSW (CCM).  07-29-2021 [name], MD. START zithromax 250 mg Take 2 tablets on day 1, then 1 tablet daily on days 2 through 5.   Recent consult visits:  08-12-2021 Marzetta Board, DPM (Podiatry). Follow up visit no changes.  07-31-2021 Janeann Forehand, PAC. X-Ray Neck Soft Tissue CHG X-RAY NECK SOFT TISSUE.   Hospital visits:  None in previous 6 months  Medications: Outpatient Encounter Medications as of 08/16/2021  Medication Sig Note   atorvastatin (LIPITOR) 10 MG tablet Take 1 tablet (10 mg total) by mouth daily.    benzonatate (TESSALON) 100 MG capsule Take 1 capsule (100 mg total) by mouth 2 (two) times daily as needed for cough.    Blood Glucose Monitoring Suppl (ONETOUCH VERIO REFLECT) w/Device KIT AS DIRECTED    buPROPion (WELLBUTRIN XL) 300 MG 24 hr tablet Take 1 tablet (300 mg total) by mouth every evening.    busPIRone (BUSPAR) 5 MG tablet Take 1 tablet (5 mg total) by mouth 3 (three) times daily.    calcium citrate-vitamin D (CITRACAL+D) 315-200 MG-UNIT tablet Take 1 tablet by mouth 2 (two) times daily.    clotrimazole-betamethasone (LOTRISONE) cream Apply twice daily as needed to area of concern    Continuous Blood Gluc Receiver (FREESTYLE LIBRE 2 READER) DEVI E11.69 Check blood sugar 4 times daily as directed    Continuous Blood Gluc Sensor (FREESTYLE LIBRE 2 SENSOR) MISC E11.69 Change sensor every 14 days as directed    cyclobenzaprine (FLEXERIL) 10 MG tablet TAKE ONE TABLET BY MOUTH EVERY 8 HOURS AS NEEDED FOR muscle SPASMS    dicyclomine (BENTYL) 20 MG tablet TAKE ONE TABLET BY MOUTH BEFORE MEALS AND AT BEDTIME AS NEEDED FOR STOMACH CRAMPING    Dulaglutide (TRULICITY) 4.5 XI/3.3AS SOPN Inject 4.5 mg as directed once a week.     Empagliflozin-metFORMIN HCl (SYNJARDY) 12.05-998 MG TABS Take 1 tablet by mouth 2 (two) times daily.    famotidine (PEPCID) 20 MG tablet Take 1 tablet (20 mg total) by mouth 2 (two) times daily.    ferrous sulfate 325 (65 FE) MG tablet Take 325 mg by mouth every evening.    gabapentin (NEURONTIN) 300 MG capsule Take 2 capsules (600 mg total) by mouth 3 (three) times daily.    insulin degludec (TRESIBA FLEXTOUCH) 200 UNIT/ML FlexTouch Pen Inject 52 Units into the skin daily.    Levomilnacipran HCl ER (FETZIMA) 80 MG CP24 Take 1 capsule by mouth daily.    levothyroxine (SYNTHROID) 75 MCG tablet Take 1 tablet (75 mcg total) by mouth daily.    loratadine (CLARITIN) 10 MG tablet Take 10 mg by mouth daily. Taking 3-4 days each week due to bone pain associated with shot from cancer center.    losartan (COZAAR) 100 MG tablet Take 1 tablet (100 mg total) by mouth daily.    Magnesium 500 MG CAPS 500 mg.    morphine (MS CONTIN) 30 MG 12 hr tablet Take 1 tablet (30 mg total) by mouth every 12 (twelve) hours.    Multiple Vitamin (MULTIVITAMIN WITH MINERALS) TABS tablet Take 1 tablet by mouth daily.  06/08/2018: Pt plans on purchasing again when she can get out.   omega-3 acid ethyl esters (LOVAZA) 1 g capsule Take 2 capsules (2 g total) by mouth 2 (two) times  daily.    ondansetron (ZOFRAN) 4 MG tablet Take 1 tablet (4 mg total) by mouth every 4 (four) hours as needed for nausea.    OneTouch Delica Lancets 83M MISC 1 each by Does not apply route daily before breakfast. Check blood sugar twice daily.    pantoprazole (PROTONIX) 40 MG tablet Take 1 tablet (40 mg total) by mouth 2 (two) times daily.    potassium chloride (MICRO-K) 10 MEQ CR capsule Take 2 capsules (20 mEq total) by mouth 2 (two) times daily.    prochlorperazine (COMPAZINE) 10 MG tablet TAKE ONE TABLET BY MOUTH EVERY 6 HOURS AS NEEDED FOR NAUSEA AND VOMITING    rOPINIRole (REQUIP) 0.25 MG tablet Take 1 tablet (0.25 mg total) by mouth at bedtime.     SODIUM FLUORIDE 5000 SENSITIVE 1.1-5 % GEL Take by mouth 2 (two) times daily.    Specialty Vitamins Products (MAGNESIUM, AMINO ACID CHELATE,) 133 MG tablet Take 1 tablet by mouth at bedtime.    Vitamin D, Ergocalciferol, (DRISDOL) 1.25 MG (50000 UNIT) CAPS capsule Take one capsule by mouth weekly    VYVANSE 70 MG capsule TAKE ONE CAPSULE BY MOUTH ONCE daily    zolpidem (AMBIEN) 10 MG tablet One before bed    Facility-Administered Encounter Medications as of 08/16/2021  Medication   sodium chloride flush (NS) 0.9 % injection 10 mL  Reviewed chart for medication changes ahead of medication coordination call.  No hospital visits since last care coordination call/Pharmacist visit.   No medication changes indicated   BP Readings from Last 3 Encounters:  07/29/21 140/70  07/14/21 124/72  06/25/21 132/84    Lab Results  Component Value Date   HGBA1C 7.6 (H) 06/23/2021     Patient obtains medications through Adherence Packaging  30 Days   Last adherence delivery included:  Tresiba Flex Inj 200u- inject 52 units daily      Atorvastatin 10 mg 1 EM Synjardy 12.5-1000mg 1 B 1 EM Famotidine 89m 1 B 1 EM Lostartan Pot 563m1 EM Bupropion HCI XL 30031m B Levothyroxine 27m58m BB Pantoprazole 40mg23m 1 EM Zolpidem 10mg 88m Potassium Chloride ER 10MEQ 2 B 2 EM Fetzima ER 80mg  19m Morphine Sulfate ER 30mg 1 41mevery 12 hours       Buspirone 5mg 1 B 61m1 EM Vyvanse 70mg 1 B 44minirole 0.25mg 1 BT 14mza 1gm 2 B 2 EM Gabapentin 300mg 2 caps57m three times daily        Trulicity 4.5 inject 4.5mg once w1.9QQ             Vitamin D2 1250mcg 1 B on50mdays Freestyle Libre 2 sensors change every 14 days       Prochlorperazine 10mg 1 tab ev16m6 hours as needed         Ondansetron 4mg 1 tab ever64m hours as needed      Cyclobenzaprine 10mg 1 tab ever32mhours as needed        Patient declined (meds) last month: Clotrim-beta cream twice daily-Pt only uses prn and does not need this  time  Dicyclomine 20mg- Does not u50might now    Patient is due for next adherence delivery on: 08-26-2021  Called patient and reviewed medications and coordinated delivery.  This delivery to include: Tresiba Flex Inj 200u- inject 52 units daily      Atorvastatin 10mg 1 EM Synjard32m.5-1000mg 1 B 1 EM Famotidine 20mg 1 B 164m  EM Lostartan Pot 57m 1 EM Bupropion HCI XL 3051m1 B Levothyroxine 7525m1 BB Pantoprazole 56m52mB 1 EM Zolpidem 10mg4mT Potassium Chloride ER 10MEQ 2 B 2 EM Fetzima ER 80mg 28mM Morphine Sulfate ER 30 mg 1 tab every 12 hours (Bottle)       Buspirone 5mg 1 57m L1 EM Vyvanse 70mg 1 4mopinirole 0.25mg 1 B55mvaza 1gm 2 B 2 EM Gabapentin 300mg 2 ca37mes three times daily (Bottle)      Trulicity 4.5 inject 4.5mg once7.6LYly             Vitamin D2 1250mcg 1 B 37mridays Freestyle Libre 2 sensors change every 14 days       Prochlorperazine 10mg 1 tab 33my 6 hours as needed (Bottle)        Ondansetron 4mg 1 tab ev65m 4 hours as needed (Bottle) Cyclobenzaprine 10mg 1 tab ev41m8 hours as needed (Bottle)  No short/acute fill needed  Patient declined the following medications: None  Patient needs refills for: Sent Atorvastatin Synjardy  Morphine Sulfate  Vyvanse Gabapentin Zofran  Confirmed delivery date of 08-26-2021 advised patient that pharmacy will contact them the morning of delivery.  Care Gaps: Shingrix overdue Yearly ophthalmology overdue  Star Rating Drugs: Losartan 100 mg- Last filled 07-23-2021 30 DS Atorvastatin 10 mg- Last filled 07-23-2021 30 [id number] 4.5 mg- Last filled 07-23-2021 30 DS Synjardy 12.05-998 mg- Last filled 07-23-2021 30 DS  Malecca Hicks Maharishi Vedic Citymacist Assistant [phone]

## 2021-08-17 NOTE — Telephone Encounter (Signed)
Compliant on meds

## 2021-08-19 ENCOUNTER — Other Ambulatory Visit: Payer: Self-pay | Admitting: Hematology and Oncology

## 2021-08-19 DIAGNOSIS — R110 Nausea: Secondary | ICD-10-CM

## 2021-08-19 NOTE — Progress Notes (Signed)
  Subjective:  Patient ID: Linda [name], female    DOB: 10/04/1970,  MRN: [id number]  Tyarra Nolton presents to clinic today for preventative diabetic foot care and callus(es) b/l lower extremities and painful thick toenails that are difficult to trim. Painful toenails interfere with ambulation. Aggravating factors include wearing enclosed shoe gear. Pain is relieved with periodic professional debridement. Painful calluses are aggravated when weightbearing with and without shoegear. Pain is relieved with periodic professional debridement.  Last A1c was in the 7% range. Patient did not check blood glucose today.  New problem(s): None.   PCP is [name], Elnita Maxwell, MD , and last visit was  July 29, 2021  Allergies  Allergen Reactions   Codeine Shortness Of Breath    Other reaction(s): SHOB   Celecoxib Other (See Comments)    Unknown reaction Other reaction(s): Unknown   Ezetimibe     Other reaction(s): Unknown   Ezetimibe-Simvastatin Other (See Comments)    Unknown reaction   Propranolol     Other reaction(s): Unknown   Propranolol Hcl Other (See Comments)    Unknown reaction   Simvastatin     Other reaction(s): Unknown    Review of Systems: Negative except as noted in the HPI.  Objective:  Vascular Examination: CFT <3 seconds b/l LE. Faintly palpable DP pulses b/l LE. Faintly palpable PT pulse(s) b/l LE. Pedal hair present. No pain with calf compression b/l. Lower extremity skin temperature gradient within normal limits. No edema noted b/l LE. No cyanosis or clubbing noted b/l LE.  Neurological Examination: Protective sensation diminished with 10g monofilament b/l.  Dermatological Examination: Pedal skin mildly dry. No open wounds b/l LE. No interdigital macerations noted b/l LE. Toenails 1-5 b/l elongated, discolored, dystrophic, thickened, crumbly with subungual debris and tenderness to dorsal palpation. Hyperkeratotic lesion(s) bilateral heels, L hallux, submet head  1 b/l, and submet head 5 right foot.  No erythema, no edema, no drainage, no fluctuance.  Musculoskeletal Examination: Muscle strength 5/5 to b/l LE. No pain, crepitus or joint limitation noted with ROM bilateral LE. Hammertoe deformity noted 2-5 b/l.  Radiographs: None     Latest Ref Rng & Units 06/23/2021    8:41 AM 03/23/2021    8:53 AM 11/16/2020   11:42 AM  Hemoglobin A1C  Hemoglobin-A1c 4.8 - 5.6 % 7.6  7.1  8.2    Assessment/Plan: 1. Pain due to onychomycosis of toenails of both feet   2. Callus   3. Chemotherapy-induced thrombocytopenia   4. Type 2 diabetes mellitus with hyperglycemia, with long-term current use of insulin (HCC)     -Examined patient. -Recommended Cerave Lotion for daily moisture. -Continue foot and shoe inspections daily. Monitor blood glucose per PCP/Endocrinologist's recommendations. -Patient to continue soft, supportive shoe gear daily. -Toenails 1-5 b/l were debrided in length and girth with sterile nail nippers and dremel without iatrogenic bleeding.  -Callus(es) bilateral heels, left great toe, submet head 1 b/l, and submet head 5 right foot pared utilizing sterile scalpel blade without complication or incident. Total number debrided =6. -Patient/POA to call should there be question/concern in the interim.   Return in about 3 months (around 11/12/2021).  Marzetta Board, DPM

## 2021-08-23 DIAGNOSIS — Z794 Long term (current) use of insulin: Secondary | ICD-10-CM

## 2021-08-23 DIAGNOSIS — E1165 Type 2 diabetes mellitus with hyperglycemia: Secondary | ICD-10-CM | POA: Diagnosis not present

## 2021-08-23 DIAGNOSIS — F330 Major depressive disorder, recurrent, mild: Secondary | ICD-10-CM

## 2021-08-24 ENCOUNTER — Encounter: Payer: Self-pay | Admitting: Hematology and Oncology

## 2021-09-01 ENCOUNTER — Ambulatory Visit (INDEPENDENT_AMBULATORY_CARE_PROVIDER_SITE_OTHER): Payer: HMO

## 2021-09-01 DIAGNOSIS — E1165 Type 2 diabetes mellitus with hyperglycemia: Secondary | ICD-10-CM

## 2021-09-01 DIAGNOSIS — F330 Major depressive disorder, recurrent, mild: Secondary | ICD-10-CM

## 2021-09-01 DIAGNOSIS — E782 Mixed hyperlipidemia: Secondary | ICD-10-CM

## 2021-09-01 NOTE — Patient Instructions (Signed)
Visit Information   Goals Addressed   None    Patient Care Plan: CCM Pharmacy Care Plan     Problem Identified: diabetes, hyperlipidemia, hypertension   Priority: High  Onset Date: 03/05/2020     Long-Range Goal: Disease Management   Start Date: 03/05/2020  Expected End Date: 03/05/2021  Recent Progress: On track  Priority: High  Note:   Current Barriers:  Unable to maintain control of blood sugar while on steroids/chemotherapy.    Pharmacist Clinical Goal(s):  Over the next 90 days, patient will achieve control of diabetes as evidenced by blood sugar and a1c through collaboration with PharmD and provider.   Interventions: 1:1 collaboration with [name], Elnita Maxwell, MD regarding development and update of comprehensive plan of care as evidenced by provider attestation and co-signature Inter-disciplinary care team collaboration (see longitudinal plan of care) Comprehensive medication review performed; medication list updated in electronic medical record  Hypertension (BP goal <130/80) BP Readings from Last 3 Encounters:  07/29/21 140/70  07/14/21 124/72  06/25/21 132/84  -controlled -Current treatment: losartan 100 mg daily Appropriate, Effective, Safe, Accessible -Medications previously tried: none reported  -Current home readings:  August 2023: 119/84 133/82 121/74 124/78 -Current dietary habits: eating ice cream every night -Current exercise habits: none -Denies hypotensive/hypertensive symptoms -Educated on BP goals and benefits of medications for prevention of heart attack, stroke and kidney damage; Daily salt intake goal < 2300 mg; -Counseled to monitor BP at home as needed, document, and provide log at future appointments -Counseled on diet and exercise extensively Recommended to continue current medication  Hyperlipidemia: (LDL goal < 70) The 10-year ASCVD risk score (Arnett DK, et al., 2019) is: 3.9%   Values used to calculate the score:     Age: 51 years      Sex: Female     Is Non-Hispanic African American: No     Diabetic: Yes     Tobacco smoker: No     Systolic Blood Pressure: 287 mmHg     Is BP treated: Yes     HDL Cholesterol: 50 mg/dL     Total Cholesterol: 145 mg/dL Lab Results  Component Value Date   CHOL 145 06/23/2021   CHOL 157 03/23/2021   CHOL 227 (H) 11/16/2020   Lab Results  Component Value Date   HDL 50 06/23/2021   HDL 50 03/23/2021   HDL 46 11/16/2020   Lab Results  Component Value Date   LDLCALC 69 06/23/2021   LDLCALC 78 03/23/2021   LDLCALC 130 (H) 11/16/2020   Lab Results  Component Value Date   TRIG 150 (H) 06/23/2021   TRIG 170 (H) 03/23/2021   TRIG 284 (H) 11/16/2020   Lab Results  Component Value Date   CHOLHDL 2.9 06/23/2021   CHOLHDL 3.1 03/23/2021   CHOLHDL 4.9 (H) 11/16/2020  No results found for: "LDLDIRECT" -uncontrolled  -Current treatment: Atorvastatin 5m Appropriate, Effective, Safe, Accessible -Medications previously tried: Pravastatin -Current dietary patterns: eating what family provides during chemotherapy -Current exercise habits: limited due to chemo -Educated on Cholesterol goals;  Importance of limiting foods high in cholesterol; Exercise goal of 150 minutes per week; August 2023: Recommend high intensity statin  Diabetes (A1c goal <7%) Lab Results  Component Value Date   HGBA1C 7.6 (H) 06/23/2021   HGBA1C 7.1 (H) 03/23/2021   HGBA1C 8.2 (H) 11/16/2020   Lab Results  Component Value Date   MICROALBUR 30 10/09/2019   LDLCALC 69 06/23/2021   CREATININE 0.8 06/25/2021   Lab Results  Component Value Date   NA 141 06/25/2021   K 4.4 06/25/2021   CREATININE 0.8 06/25/2021   EGFR 61 06/23/2021   GFRNONAA >60 11/27/2019   GLUCOSE 145 (H) 06/23/2021   Lab Results  Component Value Date   WBC 4.5 06/25/2021   HGB 11.6 (A) 06/25/2021   HCT 37 06/25/2021   MCV 81 06/25/2021   PLT 74 (A) 06/25/2021   Wt Readings from Last 3 Encounters:  07/29/21 216 lb 6.4 oz  (98.2 kg)  07/14/21 210 lb (95.3 kg)  06/25/21 202 lb 14.4 oz (92 kg)   -uncontrolled -Current medications: Trulicity 4.5 mg weekly Appropriate, Query effective, Safe, Accessible Empagliflozin/Metformin 12.5/1000mg BID Appropriate, Query effective, Safe, Accessible Tresiba 52 units Appropriate, Query effective, Safe, Accessible [employer] (4x/day) Appropriate, Query effective, Safe, Accessible -Medications previously tried: none reported  -Current home glucose readings fasting glucose:  May 2023: 06-09-2021 251, 259, 271, 196, 200, 198 June 2023: Didn't have readings on her August 2023:  250. 200, 350,  post prandial glucose: -Reports hypoglycemic symptoms -Current meal patterns:  Patient has food insecurity, unable to get food unless it's at a food bank. Even there, you can't choose much.  -Current exercise: limited due to chemotherapy -Educated onA1c and blood sugar goals; Exercise goal of 150 minutes per week; Benefits of routine self-monitoring of blood sugar; -Counseled to check feet daily and get yearly eye exams -Counseled on diet and exercise extensively  May 2023:  -Patient has food insecurity, will ask PCP if we can do referral for social worker -Patient took DM class many years ago but she has difficulty making healthy meals out of what the food banks give her. She'd like to meet with a dietician/take a class. Will ask for referral -Patient used to qualify for Medicaid, but when her father passed and left her some money, she didn't qualify anymore. She believes she is close to qualifying again. She will call Medicaid as soon as she hangs up with me and try to apply/find out when she qualifies -Patient states she has issues with binge eating. She will wake up in the middle of the night and binge eat. Spent extensive time counseling that the key to DM isn't "not eating" but it's eating consistently. Her "homework" for the next month will be to try to eat consistently and  not skip meals. She is unable to focus on healthy food due to cost, so we'll focus on consistency until the social worker can help out June 2023: Patient states she isn't eating as much at night. States it's hard to eat consistently during the day because "Vyvanse" prevents the urge. Had appt with Dietician this week but had to cancel due to cold. Rescheduled until next month. LCSW reaching out this afternoon August 2023: Patient missed dietician appt again (She is still sick). States she's eating ice cream every night. Recommend adding short term insulin (Patient at max dose of long term insulin)  Depression/Anxiety (Goal: manage symptoms) -uncontrolled -Current treatment: Wellbutrin XL 300 mg daily Appropriate, Effective, Safe, Accessible Fetzima 80 mg Appropriate, Effective, Safe, Accessible Lorazepam 0.5 mg bid prn anxiety Appropriate, Effective, Safe, Accessible Vyvanse 40m QD Appropriate, Effective, Safe, Accessible -Medications previously tried/failed: none reported -PHQ9:     07/01/2021    2:19 PM 06/23/2021    7:40 AM 03/23/2021    8:36 AM  Depression screen PHQ 2/9  Decreased Interest 3 0 0  Down, Depressed, Hopeless 1 0 0  PHQ - 2 Score 4 0 0  Altered sleeping 3  1  Tired, decreased energy 3  1  Change in appetite 3  1  Feeling bad or failure about yourself  3  0  Trouble concentrating 3  0  Moving slowly or fidgety/restless 2  0  Suicidal thoughts 0  0  PHQ-9 Score 21  3  Difficult doing work/chores Somewhat difficult  Not difficult at all  -GAD7:     07/21/2020   11:40 AM 03/09/2020    9:52 AM  GAD 7 : Generalized Anxiety Score  Nervous, Anxious, on Edge 2 3  Control/stop worrying 2 3  Worry too much - different things 2 3  Trouble relaxing 2 3  Restless 2 2  Easily annoyed or irritable 1 1  Afraid - awful might happen 2 1  Total GAD 7 Score 13 16   -Educated on Benefits of cognitive-behavioral therapy with or without medication Recommend continue current  therapy   Patient Goals/Self-Care Activities Over the next 90 days, patient will:  - take medications as prescribed check glucose three times daily , document, and provide at future appointments  Follow Up Plan: Telephone follow up appointment with care management team member scheduled for: October 2023  [place] Constable, Sherian Rein.D. - [phone]     Patient Care Plan: LCSW Plan of Care     Problem Identified: Symptoms (Depression)      Long-Range Goal: Reduce symptoms of depression and connect with community/activities to improve QOL Completed 08/12/2021  Start Date: 07/01/2021  Expected End Date: 09/22/2021  Recent Progress: On track  Priority: High  Note:   Current Barriers:  Limited social support, Transportation, Limited access to food, Mental Health Concerns , Social Isolation, and Lacks knowledge of community resource:     CSW Clinical Goal(s):  Patient  will explore community resource options for unmet needs related to:  Transportation, Sales promotion account executive , Landscape architect , Depression  , and Social Connections through collaboration with [facility] representative, provider, and care team.   Interventions: CSW made contact with pt today who has been sick with bronchitis like symptoms- "been on antibiotics and then a steroid shot". She has not felt up to looking into the  Pt reports her depression is minimal- has a [age] next week!  Pt still planning to consider "silver sneakers" program or similar program at the "[facility]".  Pt advised of plans to follow up with her via Care Coordination in 6 weeks.       07/01/2021    2:19 PM 06/23/2021    7:40 AM 03/23/2021    8:36 AM 01/20/2021    7:59 AM 11/16/2020   10:49 AM  Depression screen PHQ 2/9  Decreased Interest 3 0 0 0 2  Down, Depressed, Hopeless 1 0 0 1 1  PHQ - 2 Score 4 0 0 1 3  Altered sleeping 3  1 0 1  Tired, decreased energy _0 0  Change in appetite 3  1 0 3  Feeling bad or failure about yourself  3  0 1 1   Trouble concentrating 3  0 1 2  Moving slowly or fidgety/restless 2  0 0 3  Suicidal thoughts 0  0 0 0  PHQ-9 Score _1 Difficult doing work/chores Somewhat difficult  Not difficult at all Not difficult at all Somewhat difficult     1:1 collaboration with primary care provider regarding development and update of comprehensive plan of care as evidenced by provider attestation  and co-signature Inter-disciplinary care team collaboration (see longitudinal plan of care) Evaluation of current treatment plan related to  self management and patient's adherence to plan as established by provider Review resources, discussed options and provided patient information about  Community food options  Referral to care guide ( )    Mental Health:  (Status: New goal.) Evaluation of current treatment plan related to Depression: depressed mood anxiety hopelessness loss of energy/fatigue anhedonia insomnia disturbed sleep increased appetite and Eating Disorder: "binge eating" Depression screen reviewed  PHQ2/ PHQ9 completed Solution-Focused Strategies employed:  Mindfulness or Relaxation training provided Active listening / Reflection utilized  Emotional Support Provided Problem [place] strategies reviewed Provided psychoeducation for mental health needs  Reviewed mental health medications and discussed importance of compliance:    Task & activities to accomplish goals: -call [facility] to schedule visit -review info emailed to you about the [employer] benefits you have (dental, etc)  - keep 90 percent of counseling appointments - schedule counseling appointment  -review resource material mailed/emailed to you and consider connecting            Ms. Riles was given information about Chronic Care Management services today including:  CCM service includes personalized support from designated clinical staff supervised by her physician, including  individualized plan of care and coordination with other care providers 24/7 contact phone numbers for assistance for urgent and routine care needs. Standard insurance, coinsurance, copays and deductibles apply for chronic care management only during months in which we provide at least 20 minutes of these services. Most insurances cover these services at 100%, however patients may be responsible for any copay, coinsurance and/or deductible if applicable. This service may help you avoid the need for more expensive face-to-face services. Only one practitioner may furnish and bill the service in a calendar month. The patient may stop CCM services at any time (effective at the end of the month) by phone call to the office staff.  Patient agreed to services and verbal consent obtained.   The patient verbalized understanding of instructions, educational materials, and care plan provided today and DECLINED offer to receive copy of patient instructions, educational materials, and care plan.  The pharmacy team will reach out to the patient again over the next 60 days.   Lane Hacker, [place]
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
## 2021-09-10 DIAGNOSIS — K769 Liver disease, unspecified: Secondary | ICD-10-CM | POA: Diagnosis not present

## 2021-09-10 DIAGNOSIS — Z1501 Genetic susceptibility to malignant neoplasm of breast: Secondary | ICD-10-CM | POA: Diagnosis not present

## 2021-09-10 DIAGNOSIS — K7581 Nonalcoholic steatohepatitis (NASH): Secondary | ICD-10-CM | POA: Diagnosis not present

## 2021-09-10 DIAGNOSIS — Z1509 Genetic susceptibility to other malignant neoplasm: Secondary | ICD-10-CM | POA: Diagnosis not present

## 2021-09-10 DIAGNOSIS — K7460 Unspecified cirrhosis of liver: Secondary | ICD-10-CM | POA: Diagnosis not present

## 2021-09-10 DIAGNOSIS — I700 Atherosclerosis of aorta: Secondary | ICD-10-CM | POA: Diagnosis not present

## 2021-09-10 DIAGNOSIS — R918 Other nonspecific abnormal finding of lung field: Secondary | ICD-10-CM | POA: Diagnosis not present

## 2021-09-12 NOTE — Progress Notes (Signed)
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 She was treated with bilateral mastectomy due to BRCA1 mutation.  She completed 4 cycles of Adriamycin/cyclophosphamide chemotherapy and received 8 out of 12 planned weeks of weekly paclitaxel.  She remains without evidence of recurrence.   BRCA1 positive Positive BRCA 1 mutation, which increases her risk for breast and ovarian cancer, as well as elevates her risk for pancreatic cancer.  She underwent hysterectomy/bilateral salpingo-oophorectomy.  The hepatologist recommended biannual screening for hepatocellular carcinoma with ultrasound alternating with cross-sectional imaging to allow screening for pancreatic cancer.  Right upper quadrant ultrasound in February revealed nodular hepatic contour with coarse hepatic echogenicity consistent with known cirrhosis.  No focal hepatic abnormality was identified. She had s CT abdomen in August, and this is stable.   Liver cirrhosis secondary to NASH (nonalcoholic steatohepatitis) (Linda [name]) Liver cirrhosis as seen on CT imaging in August 2020.  She was referred to [facility], [place], at the [facility] in [place].  Hepatitis panel was negative.  She received the appropriate vaccines.  CT imaging in May 2022 was stable.  [name] recommended every 6 month screening for hepatocellular cancer with ultrasound alternating with cross-sectional imaging to allow for screening for pancreatic cancer.  Right upper quadrant ultrasound in February did not reveal any focal hepatic abnormality.  CT imaging in  August was to screen for hepatocellular due to cirrhosis and we see 2 tiny hypodense liver lesions in the right lobe measuring 4 mm each and stable from September 2021.  The pancreas appears norma, we are screening due to her BRCA1 mutation.   Idiopathic thrombocytopenic purpura (ITP) (HCC) Thrombocytopenia, which is felt to be due to chronic ITP and liver cirrhosis.  She continues to have regular blood work at Dr. [employer] office.  CBC in February revealed stable mild thrombocytopenia with a platelet count of 99,000.  Her platelet count today was back down to 74,000 last time but is now improved at 91,000.  The thrombocytopenia could also be due to her liver disease.  Persistent respiratory infection She has severe wheezing in all lung fields and has been through steroids as well as antibiotics with Rocephin, Zithromax and something else.  The CT scan does show some changes in the right lower lobe with some groundglass opacity and patchy tree-in-bud nodularity as well as a indistinct 8 mm nodule which is new.  I have contacted Dr. Tobie Poet and we discussed her management.  I will place her on Augmentin 875 mg twice daily for 10 days and also prescribe an inhaler.    I will place her on Augmentin 875 mg twice daily for 10 days and also prescribe an inhaler.  Dr. Tobie Poet will see her on Friday for follow-up to see if she has improved. We will schedule her for CT of the chest.  I agree that we can alternate ultrasounds with CT scans but I would recommend a CT scan yearly since she does have BRCA1 positivity and is at increased risk for pancreatic cancer.  I will see her back in 3 months with CBC, comp CMP, and AFP. The patient understands the  plans discussed today and is in agreement with them.  She knows to contact our office if she develops concerns prior to her next appointment.   I provided 30 minutes of face-to-face time during this encounter and > 50% was spent counseling as documented under my assessment and  plan.    Linda Kaplan, MD  [facility] [address] [place] [address] Dept: [phone] Dept Fax: [phone]   Orders Placed This Encounter  Procedures  . CBC and differential    This external order was created through the Results Console.  . CBC    This external order was created through the Results Console.  . Basic metabolic panel    This external order was created through the Results Console.  . Comprehensive metabolic panel    This external order was created through the Results Console.  . Hepatic function panel    This external order was created through the Results Console.      CHIEF COMPLAINT:  CC: Stage IB triple negative breast cancer  Current Treatment: Observation  HISTORY OF PRESENT ILLNESS:  Linda [name] is a 51 Y.O. female who we began seeing in February 2018 for evaluation of anemia.  Her anemia was felt to be secondary to iron deficiency and we recommended continuation of oral iron supplementation for a total of 6 months, as well as referral back to the gastroenterologist.  The patient also has a history of thrombocytopenia and had previously seen Dr. Bobby Rumpf in 2014.  The thrombocytopenia was felt to be secondary to mild chronic immune thrombocytopenic purpura (ITP).  Due to the patient's family history of malignancy, she underwent testing for hereditary cancer syndromes with the My[employer] myRisk Hereditary Cancer Panel test.  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  She underwent a robotic hysterectomy and bilateral salpingo-oophorectomy in May 2018 with Dr. Everitt Amber.  Pathology was benign.  We also discussed the option of risk reducing bilateral mastectomy, but she chose to have close surveillance, so we recommended annual MRI breast, in addition to  annual mammography.  She was placed on chemoprevention with raloxifene in September 2018.  CT abdomen and pelvis in August 2020 done for bilateral flank pain, revealed a tiny left renal calculus, as well as probable hepatic cirrhosis without evidence of hepatic mass.  She was then referred to [facility] and is followed by Roosevelt Locks, CRNP in [place] for her liver cirrhosis.  She tested negative for hepatitis A, B, and C.  She received her hepatitis vaccines in 2020.   We saw her in September 2021 for a new diagnosis of stage IB (T1c N0 M0) triple negative right breast cancer.  She underwent screening bilateral mammogram on August 3rd which revealed possible masses in the right breast.  Diagnostic right mammogram and right breast ultrasound from August 18th confirmed suspicious masses in the right breast at 5 o'clock measuring 1.5 cm and 6 o'clock measuring 1.4 cm.  There was an indeterminate 4 mm with possible distortion in the outer right breast without sonographic correlate.  Right axillary ultrasound was normal.  She then underwent biopsies of both masses and surgical pathology from these procedures revealed  invasive ductal carcinoma, grade 3, with necrosis at 5 o'clock; and invasive ductal carcinoma, grade 1-2, with necrosis and focal myxoid change  at 6 o'clock.  No DCIS was identified.  Breast prognostic profiles revealed HER2, and estrogen and progesterone receptors to be negative. Ki67 was 30% at the 5 o'clock mass, and 40% at 6 o'clock.  She underwent bilateral mastectomies on September 24th with Dr. Noberto Retort. Surgical pathology from this procedure revealed invasive ductal carcinoma, grade 3, with myxoid change, 19 mm, at 6 o'clock, and invasive ductal carcinoma, grade 3, and ductal carcinoma in situ, 19 mm, at 5 o'clock.  All margins were negative for invasive carcinoma or DCIS.  Two sentinel lymph nodes were negative for metastatic carcinoma (0/2). CT chest, abdomen and  pelvis in September did not reveal any evidence of metastatic disease.  She started dose dense AC chemotherapy on October 25th , with plans to follow this with weekly paclitaxel for 12 doses.  She had significantly worsened anemia with a hemoglobin of 8.7, as well as pancytopenia, so the 4th cycle of chemotherapy was delayed, but she finally got her last dose on January 4th. Her anemia has slowly improved.  She started weekly paclitaxel on January 26th and has received 8 out of 12 planned doses.   She did have some expected pancytopenia.  She had pre-existing neuropathy of the feet and legs.     She was admitted to the hospital for osteomyelitis of the left 2nd toe in March 2022.  She was treated with empiric vancomycin and Zosyn.  Amputation was recommended but the patient refused.  Cultures returned with Klebsiella, sensitive to IV Rocephin, so long term antibiotics 2 g Q24H for continued for the next 6 weeks. At that time, we decided to discontinue weekly paclitaxel, as she had received 8/12 cycles and had significant neuropathy.  She has done fairly well since that time.  She has mild chronic thrombocytopenia felt to be chronic ITP, although, this could be secondary to her liver disease.  In February 2023, a liver ultrasound did not reveal any liver masses.  [name] recommended alternating ultrasound with cross-sectional imaging to allow for pancreatic cancer screening due to her BRCA1 mutation.  INTERVAL HISTORY:  Rennae is here today for repeat clinical assessment.  She states she has been doing fairly well.  She did have a CT scan of the abdomen which is overall stable.  There are 2 tiny hypodense lesions in the right liver, both measuring 4 mm and stable from scan of September 2021.  The pancreas appears normal.  She has mild to moderate splenomegaly which is stable.  She has mild paraumbilical varices which are stable.  The only new finding is some patchy tree-in-bud nodularity and ground glass  opacity in the dependent medial basilar right lower lobe with an indistinct 8 mm nodule which is new from scan of September 2021.  She complains of soreness of the left mid chest wall, which is consistent with costochondritis.  She does complain of a cough for over a month and has been on various antibiotics and steroids and is still not clear.  She had an injection at urgent care which we think was Rocephin and later had a course of Zithromax.  She has also been on a Medrol Dosepak.  She denies any nausea, vomiting, diarrhea, constipation or abdominal pain.  She has had previous back surgery.  She denies fevers or chills. She denies pain. Her appetite is good.  She still has chronic lower back pain and has known osteoarthritis and degenerative changes.  Her labs show a platelet count improved from 74,000 to 91,000 and  her hemoglobin has improved from 11.6 to 12.2.  CMP is normal other than a nonfasting blood sugar of 237, but she has had recent corticosteroids.  REVIEW OF SYSTEMS:  Review of Systems  Constitutional: Negative.  Negative for appetite change, chills, fatigue, fever and unexpected weight change.  HENT:  Negative.  Negative for lump/mass, mouth sores and sore throat.   Eyes: Negative.   Respiratory:  Positive for cough and wheezing. Negative for shortness of breath.   Cardiovascular:  Negative for chest pain and leg swelling.  Gastrointestinal: Negative.  Negative for abdominal pain, constipation, diarrhea, nausea and vomiting.  Endocrine: Negative.  Negative for hot flashes.  Genitourinary: Negative.  Negative for difficulty urinating, dysuria, frequency and hematuria.   Musculoskeletal:  Positive for back pain. Negative for arthralgias and myalgias.  Skin: Negative.  Negative for rash.  Neurological: Negative.  Negative for dizziness and headaches.  Hematological:  Negative for adenopathy. Bruises/bleeds easily.  Psychiatric/Behavioral: Negative.  Negative for depression and sleep  disturbance. The patient is not nervous/anxious.      VITALS:  Blood pressure 135/73, pulse 96, temperature 98.4 F (36.9 C), temperature source Oral, resp. rate 20, height 5' 2"  (1.575 m), weight 208 lb 6.4 oz (94.5 kg), last menstrual period 06/13/2009, SpO2 95 %.  Wt Readings from Last 3 Encounters:  10/07/21 210 lb (95.3 kg)  10/01/21 209 lb (94.8 kg)  09/17/21 206 lb 6.4 oz (93.6 kg)    Body mass index is 38.12 kg/m.  Performance status (ECOG): 1 - Symptomatic but completely ambulatory  PHYSICAL EXAM:  Physical Exam Vitals and nursing note reviewed.  Constitutional:      General: She is not in acute distress.    Appearance: Normal appearance.  HENT:     Head: Normocephalic and atraumatic.     Mouth/Throat:     Mouth: Mucous membranes are moist.     Pharynx: Oropharynx is clear. No oropharyngeal exudate or posterior oropharyngeal erythema.  Eyes:     General: No scleral icterus.    Extraocular Movements: Extraocular movements intact.     Conjunctiva/sclera: Conjunctivae normal.     Pupils: Pupils are equal, round, and reactive to light.  Cardiovascular:     Rate and Rhythm: Normal rate and regular rhythm.     Heart sounds: Normal heart sounds. No murmur heard.    No friction rub. No gallop.  Pulmonary:     Effort: Pulmonary effort is normal.     Breath sounds: Rhonchi present. No wheezing or rales.     Comments: Expiratory rhonchi in all lung fields, fairly severe Chest:     Chest wall: Tenderness present.  Breasts:    Right: Absent.     Left: Absent.     Comments: Bilateral mastectomy sites are negative. Abdominal:     General: There is no distension.     Palpations: Abdomen is soft. There is hepatomegaly and splenomegaly. There is no mass.     Tenderness: There is no abdominal tenderness.  Musculoskeletal:        General: Normal range of motion.     Cervical back: Normal range of motion and neck supple. No tenderness.     Right lower leg: No edema.     Left  lower leg: No edema.  Lymphadenopathy:     Cervical: No cervical adenopathy.     Upper Body:     Right upper body: No supraclavicular or axillary adenopathy.     Left upper body: No supraclavicular or axillary adenopathy.  Lower Body: No right inguinal adenopathy. No left inguinal adenopathy.  Skin:    General: Skin is warm and dry.     Coloration: Skin is not jaundiced.     Findings: No rash.  Neurological:     Mental Status: She is alert and oriented to person, place, and time.     Cranial Nerves: No cranial nerve deficit.  Psychiatric:        Mood and Affect: Mood normal.        Behavior: Behavior normal.        Thought Content: Thought content normal.    LABS:      Latest Ref Rng & Units 10/01/2021    8:26 AM 09/13/2021   12:00 AM 06/25/2021   12:00 AM  CBC  WBC 3.4 - 10.8 x10E3/uL 5.9  4.7     4.5      Hemoglobin 11.1 - 15.9 g/dL 12.1  12.2     11.6      Hematocrit 34.0 - 46.6 % 39.5  38     37      Platelets 150 - 450 x10E3/uL 82  91     74         This result is from an external source.      Latest Ref Rng & Units 10/01/2021    8:26 AM 09/13/2021   12:00 AM 06/25/2021   12:00 AM  CMP  Glucose 70 - 99 mg/dL 139     BUN 6 - 24 mg/dL 11  12     16       Creatinine 0.57 - 1.00 mg/dL 0.91  0.8     0.8      Sodium 134 - 144 mmol/L 141  138     141      Potassium 3.5 - 5.2 mmol/L 4.3  4.0     4.4      Chloride 96 - 106 mmol/L 102  103     102      CO2 20 - 29 mmol/L 21  28     30       Calcium 8.7 - 10.2 mg/dL 9.5  8.9     9.1      Total Protein 6.0 - 8.5 g/dL 6.5     Total Bilirubin 0.0 - 1.2 mg/dL 0.5     Alkaline Phos 44 - 121 IU/L 120  111     82      AST 0 - 40 IU/L 19  30     30       ALT 0 - 32 IU/L 21  23     30          This result is from an external source.     No results found for: "CEA1", "CEA" / No results found for: "CEA1", "CEA" No results found for: "PSA1" No results found for: "CMK349" No results found for: "CAN125"  No results found for:  "TOTALPROTELP", "ALBUMINELP", "A1GS", "A2GS", "BETS", "BETA2SER", "GAMS", "MSPIKE", "SPEI" Lab Results  Component Value Date   TIBC 312 05/20/2020   TIBC 300 01/13/2020   FERRITIN 98 05/20/2020   IRONPCTSAT 26 05/20/2020   IRONPCTSAT 24.3 01/13/2020   No results found for: "LDH"  STUDIES:  No results found.    HISTORY:   Past Medical History:  Diagnosis Date  . Acute postoperative respiratory insufficiency 04/17/2020  . AKI (acute kidney injury) ([place]) 04/17/2020  . Anemia   . Anemia, unspecified   . Anemia, unspecified   .  Anemia, unspecified   . Anxiety   . Arthritis   . BRCA gene mutation positive   . Chronic pain syndrome   . Depression   . Diabetes mellitus without complication ([place])    type 2  . Diabetic ulcer of toe of left foot associated with type 2 diabetes mellitus, with fat layer exposed ([place]) 03/27/2020  . Gastritis   . 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 GERD (gastroesophageal reflux disease)   . History of kidney stones   . Hypertension   . Hypothyroidism   . Malignant neoplasm of central portion of right female breast ([place])   . Malignant neoplasm of lower-inner quadrant of right female breast ([place])   . Malignant neoplasm of lower-inner quadrant of right female breast ([place])   . Mixed hyperlipidemia   . Other primary thrombocytopenia ([place])   . Sleep apnea    hx of . No longer has    Past Surgical History:  Procedure Laterality Date  . APPENDECTOMY    . BACK SURGERY     x 3 lower disc  . BILATERAL TOTAL MASTECTOMY WITH AXILLARY LYMPH NODE DISSECTION Bilateral 09/2019  . CHOLECYSTECTOMY    . nephrolithiasis    . ROBOTIC ASSISTED TOTAL HYSTERECTOMY WITH BILATERAL SALPINGO OOPHERECTOMY Bilateral 06/14/2016   Procedure: ROBOTIC ASSISTED TOTAL HYSTERECTOMY WITH BILATERAL SALPINGO OOPHORECTOMY;  Surgeon: Nancy Marus, MD;  Location: [facility];  Service: Gynecology;  Laterality:  Bilateral;    Family History  Problem Relation Age of Onset  . Cancer Mother        breast  . CAD Father   . Diabetes Father   . Heart failure Father   . Cancer Father        renal carcinoma  . Kidney failure Father   . Cancer Brother 60       renal carcinoma.  . Stroke Paternal Grandmother     Social History:  reports that she has never smoked. She has never used smokeless tobacco. She reports that she does not drink alcohol and does not use drugs.The patient is alone today.  Allergies:  Allergies  Allergen Reactions  . Codeine Shortness Of Breath    Other reaction(s): SHOB  . Augmentin [Amoxicillin-Pot Clavulanate] Diarrhea  . Celecoxib Other (See Comments)    Unknown reaction Other reaction(s): Unknown  . Ezetimibe     Other reaction(s): Unknown  . Ezetimibe-Simvastatin Other (See Comments)    Unknown reaction  . Propranolol     Other reaction(s): Unknown  . Propranolol Hcl Other (See Comments)    Unknown reaction  . Simvastatin     Other reaction(s): Unknown    Current Medications: Current Outpatient Medications  Medication Sig Dispense Refill  . albuterol (VENTOLIN HFA) 108 (90 Base) MCG/ACT inhaler Inhale 2 puffs into the lungs every 6 (six) hours as needed for wheezing or shortness of breath. 8 g 1  . atorvastatin (LIPITOR) 10 MG tablet Take 1 tablet (10 mg total) by mouth daily. 90 tablet 0  . benzonatate (TESSALON) 100 MG capsule Take 1 capsule (100 mg total) by mouth 2 (two) times daily as needed for cough. 30 capsule 0  . Blood Glucose Monitoring Suppl (ONETOUCH VERIO REFLECT) w/Device KIT AS DIRECTED    . buPROPion (WELLBUTRIN XL) 300 MG 24 hr tablet Take 1 tablet (300 mg total) by mouth every evening. 90 tablet 1  . busPIRone (BUSPAR) 5 MG tablet Take 1 tablet (  5 mg total) by mouth 3 (three) times daily. 90 tablet 2  . calcium citrate-vitamin D (CITRACAL+D) 315-200 MG-UNIT tablet Take 1 tablet by mouth 2 (two) times daily.    .  clotrimazole-betamethasone (LOTRISONE) cream Apply twice daily as needed to area of concern 45 g 2  . Continuous Blood Gluc Receiver (FREESTYLE LIBRE 2 READER) DEVI E11.69 Check blood sugar 4 times daily as directed 1 each 0  . Continuous Blood Gluc Sensor (FREESTYLE LIBRE 2 SENSOR) MISC E11.69 Change sensor every 14 days as directed 6 each 3  . cyclobenzaprine (FLEXERIL) 10 MG tablet TAKE ONE TABLET BY MOUTH every EIGHT hours AS NEEDED FOR muscle SPASMS 30 tablet 3  . dicyclomine (BENTYL) 20 MG tablet TAKE ONE TABLET BY MOUTH BEFORE MEALS AND AT BEDTIME AS NEEDED FOR STOMACH CRAMPING 180 tablet 1  . Dulaglutide (TRULICITY) 4.5 HM/0.9OB SOPN Inject 4.5 mg as directed once a week. 6 mL 0  . Empagliflozin-metFORMIN HCl (SYNJARDY) 12.05-998 MG TABS Take 1 tablet by mouth 2 (two) times daily. 180 tablet 0  . famotidine (PEPCID) 20 MG tablet Take 1 tablet (20 mg total) by mouth 2 (two) times daily. 180 tablet 1  . ferrous sulfate 325 (65 FE) MG tablet Take 325 mg by mouth every evening.    . gabapentin (NEURONTIN) 300 MG capsule Take 2 capsules (600 mg total) by mouth 3 (three) times daily. 180 capsule 1  . glucose blood (ONETOUCH VERIO) test strip Use as instructed 100 each 12  . insulin degludec (TRESIBA FLEXTOUCH) 200 UNIT/ML FlexTouch Pen Inject 52 Units into the skin daily. 9 mL 3  . Levomilnacipran HCl ER (FETZIMA) 80 MG CP24 Take 1 capsule by mouth daily. 90 capsule 1  . levothyroxine (SYNTHROID) 75 MCG tablet Take 1 tablet (75 mcg total) by mouth daily. 90 tablet 1  . lisdexamfetamine (VYVANSE) 70 MG capsule Take 1 capsule (70 mg total) by mouth daily. (Patient not taking: Reported on 09/30/2021) 30 capsule 0  . losartan (COZAAR) 100 MG tablet Take 1 tablet (100 mg total) by mouth daily. 90 tablet 0  . Magnesium 500 MG CAPS 500 mg.    . morphine (MS CONTIN) 30 MG 12 hr tablet Take 1 tablet (30 mg total) by mouth every 12 (twelve) hours. 60 tablet 0  . Multiple Vitamin (MULTIVITAMIN WITH MINERALS)  TABS tablet Take 1 tablet by mouth daily.     Marland Kitchen omega-3 acid ethyl esters (LOVAZA) 1 g capsule Take 2 capsules (2 g total) by mouth 2 (two) times daily. 360 capsule 1  . ondansetron (ZOFRAN) 4 MG tablet Take 1 tablet (4 mg total) by mouth every 4 (four) hours as needed for nausea. 90 tablet 3  . OneTouch Delica Lancets 09G MISC 1 each by Does not apply route daily before breakfast. Check blood sugar twice daily. 100 each 3  . pantoprazole (PROTONIX) 40 MG tablet Take 1 tablet (40 mg total) by mouth 2 (two) times daily. (Patient not taking: Reported on 09/30/2021) 180 tablet 1  . potassium chloride (MICRO-K) 10 MEQ CR capsule Take 2 capsules (20 mEq total) by mouth 2 (two) times daily. 360 capsule 0  . rOPINIRole (REQUIP) 0.25 MG tablet Take 1 tablet (0.25 mg total) by mouth at bedtime. 90 tablet 1  . Vitamin D, Ergocalciferol, (DRISDOL) 1.25 MG (50000 UNIT) CAPS capsule Take one capsule by mouth weekly 12 capsule 1  . zolpidem (AMBIEN) 10 MG tablet One before bed 30 tablet 5   No current facility-administered medications  for this visit.   Facility-Administered Medications Ordered in Other Visits  Medication Dose Route Frequency Provider Last Rate Last Admin  . sodium chloride flush (NS) 0.9 % injection 10 mL  10 mL Intracatheter PRN Mosher, Kelli A, PA-C   10 mL at 02/19/21 1004

## 2021-09-13 ENCOUNTER — Inpatient Hospital Stay: Payer: HMO | Attending: Hematology and Oncology | Admitting: Oncology

## 2021-09-13 ENCOUNTER — Other Ambulatory Visit: Payer: Self-pay | Admitting: Oncology

## 2021-09-13 ENCOUNTER — Encounter: Payer: Self-pay | Admitting: Oncology

## 2021-09-13 ENCOUNTER — Other Ambulatory Visit: Payer: Self-pay

## 2021-09-13 VITALS — BP 135/73 | HR 96 | Temp 98.4°F | Resp 20 | Ht 62.0 in | Wt 208.4 lb

## 2021-09-13 DIAGNOSIS — C50311 Malignant neoplasm of lower-inner quadrant of right female breast: Secondary | ICD-10-CM

## 2021-09-13 DIAGNOSIS — Z1501 Genetic susceptibility to malignant neoplasm of breast: Secondary | ICD-10-CM

## 2021-09-13 DIAGNOSIS — K7460 Unspecified cirrhosis of liver: Secondary | ICD-10-CM | POA: Diagnosis not present

## 2021-09-13 DIAGNOSIS — K7581 Nonalcoholic steatohepatitis (NASH): Secondary | ICD-10-CM

## 2021-09-13 DIAGNOSIS — Z171 Estrogen receptor negative status [ER-]: Secondary | ICD-10-CM

## 2021-09-13 DIAGNOSIS — Z1509 Genetic susceptibility to other malignant neoplasm: Secondary | ICD-10-CM

## 2021-09-13 DIAGNOSIS — J209 Acute bronchitis, unspecified: Secondary | ICD-10-CM

## 2021-09-13 LAB — BASIC METABOLIC PANEL
BUN: 12 (ref 4–21)
CO2: 28 — AB (ref 13–22)
Chloride: 103 (ref 99–108)
Creatinine: 0.8 (ref 0.5–1.1)
Glucose: 237
Potassium: 4 mEq/L (ref 3.5–5.1)
Sodium: 138 (ref 137–147)

## 2021-09-13 LAB — HEPATIC FUNCTION PANEL
ALT: 23 U/L (ref 7–35)
AST: 30 (ref 13–35)
Alkaline Phosphatase: 111 (ref 25–125)
Bilirubin, Total: 0.6

## 2021-09-13 LAB — CBC AND DIFFERENTIAL
HCT: 38 (ref 36–46)
Hemoglobin: 12.2 (ref 12.0–16.0)
Neutrophils Absolute: 2.82
Platelets: 91 10*3/uL — AB (ref 150–400)
WBC: 4.7

## 2021-09-13 LAB — COMPREHENSIVE METABOLIC PANEL
Albumin: 3.9 (ref 3.5–5.0)
Calcium: 8.9 (ref 8.7–10.7)

## 2021-09-13 LAB — CBC: RBC: 4.68 (ref 3.87–5.11)

## 2021-09-13 MED ORDER — METHYLPREDNISOLONE 4 MG PO TBPK: ORAL_TABLET | ORAL | 0 refills | Status: DC

## 2021-09-13 MED ORDER — AMOXICILLIN-POT CLAVULANATE 875-125 MG PO TABS: 1.0000 | ORAL_TABLET | Freq: Two times a day (BID) | ORAL | 0 refills | Status: DC

## 2021-09-13 MED ORDER — ALBUTEROL SULFATE HFA 108 (90 BASE) MCG/ACT IN AERS: 2.0000 | INHALATION_SPRAY | Freq: Four times a day (QID) | RESPIRATORY_TRACT | 1 refills | Status: DC | PRN

## 2021-09-14 ENCOUNTER — Ambulatory Visit: Payer: HMO

## 2021-09-14 ENCOUNTER — Telehealth: Payer: Self-pay

## 2021-09-14 ENCOUNTER — Encounter: Payer: Self-pay | Admitting: Hematology and Oncology

## 2021-09-14 NOTE — Chronic Care Management (AMB) (Signed)
Chronic Care Management Pharmacy Assistant   Name: Anuja Manka  MRN: [id number] DOB: 1970/12/24   Reason for Encounter: BS readings   09-14-2021: Patient stated she started steroids due a chronic cough. so sugars are more elevated. 08-22 fasting 326, 08-21 200 range, 08-20 200 range. Patient hasn't been documenting sugars because she hasn't been feeling good. Patient will have more readings when I call back in a few weeks.  Medications: Outpatient Encounter Medications as of 09/14/2021  Medication Sig Note   albuterol (VENTOLIN HFA) 108 (90 Base) MCG/ACT inhaler Inhale 2 puffs into the lungs every 6 (six) hours as needed for wheezing or shortness of breath.    amoxicillin-clavulanate (AUGMENTIN) 875-125 MG tablet Take 1 tablet by mouth 2 (two) times daily.    atorvastatin (LIPITOR) 10 MG tablet Take 1 tablet (10 mg total) by mouth daily.    benzonatate (TESSALON) 100 MG capsule Take 1 capsule (100 mg total) by mouth 2 (two) times daily as needed for cough. (Patient not taking: Reported on 08/17/2021)    Blood Glucose Monitoring Suppl (ONETOUCH VERIO REFLECT) w/Device KIT AS DIRECTED    buPROPion (WELLBUTRIN XL) 300 MG 24 hr tablet Take 1 tablet (300 mg total) by mouth every evening.    busPIRone (BUSPAR) 5 MG tablet Take 1 tablet (5 mg total) by mouth 3 (three) times daily.    calcium citrate-vitamin D (CITRACAL+D) 315-200 MG-UNIT tablet Take 1 tablet by mouth 2 (two) times daily.    clotrimazole-betamethasone (LOTRISONE) cream Apply twice daily as needed to area of concern (Patient not taking: Reported on 08/17/2021)    Continuous Blood Gluc Receiver (FREESTYLE LIBRE 2 READER) DEVI E11.69 Check blood sugar 4 times daily as directed    Continuous Blood Gluc Sensor (FREESTYLE LIBRE 2 SENSOR) MISC E11.69 Change sensor every 14 days as directed    cyclobenzaprine (FLEXERIL) 10 MG tablet TAKE ONE TABLET BY MOUTH EVERY 8 HOURS AS NEEDED FOR muscle SPASMS    dicyclomine (BENTYL) 20 MG  tablet TAKE ONE TABLET BY MOUTH BEFORE MEALS AND AT BEDTIME AS NEEDED FOR STOMACH CRAMPING    Dulaglutide (TRULICITY) 4.5 YT/0.1SW SOPN Inject 4.5 mg as directed once a week.    Empagliflozin-metFORMIN HCl (SYNJARDY) 12.05-998 MG TABS Take 1 tablet by mouth 2 (two) times daily.    famotidine (PEPCID) 20 MG tablet Take 1 tablet (20 mg total) by mouth 2 (two) times daily.    ferrous sulfate 325 (65 FE) MG tablet Take 325 mg by mouth every evening.    gabapentin (NEURONTIN) 300 MG capsule Take 2 capsules (600 mg total) by mouth 3 (three) times daily.    insulin degludec (TRESIBA FLEXTOUCH) 200 UNIT/ML FlexTouch Pen Inject 52 Units into the skin daily.    Levomilnacipran HCl ER (FETZIMA) 80 MG CP24 Take 1 capsule by mouth daily.    levothyroxine (SYNTHROID) 75 MCG tablet Take 1 tablet (75 mcg total) by mouth daily.    lisdexamfetamine (VYVANSE) 70 MG capsule Take 1 capsule (70 mg total) by mouth daily.    loratadine (CLARITIN) 10 MG tablet Take 10 mg by mouth daily. Taking 3-4 days each week due to bone pain associated with shot from cancer center.    losartan (COZAAR) 100 MG tablet Take 1 tablet (100 mg total) by mouth daily.    Magnesium 500 MG CAPS 500 mg.    methylPREDNISolone (MEDROL DOSEPAK) 4 MG TBPK tablet Take 6 pills the first day by mouth, 5 pills the next day, 4 pills the  next day, 3 pills the next day, 2 pills the next day and 1 pill the following day and then stop    morphine (MS CONTIN) 30 MG 12 hr tablet Take 1 tablet (30 mg total) by mouth every 12 (twelve) hours.    Multiple Vitamin (MULTIVITAMIN WITH MINERALS) TABS tablet Take 1 tablet by mouth daily.  06/08/2018: Pt plans on purchasing again when she can get out.   omega-3 acid ethyl esters (LOVAZA) 1 g capsule Take 2 capsules (2 g total) by mouth 2 (two) times daily.    ondansetron (ZOFRAN) 4 MG tablet Take 1 tablet (4 mg total) by mouth every 4 (four) hours as needed for nausea.    OneTouch Delica Lancets 24E MISC 1 each by Does  not apply route daily before breakfast. Check blood sugar twice daily.    pantoprazole (PROTONIX) 40 MG tablet Take 1 tablet (40 mg total) by mouth 2 (two) times daily.    potassium chloride (MICRO-K) 10 MEQ CR capsule Take 2 capsules (20 mEq total) by mouth 2 (two) times daily.    prochlorperazine (COMPAZINE) 10 MG tablet TAKE ONE TABLET BY MOUTH every SIX hours as needed FOR nausea OR vomiting    rOPINIRole (REQUIP) 0.25 MG tablet Take 1 tablet (0.25 mg total) by mouth at bedtime.    SODIUM FLUORIDE 5000 SENSITIVE 1.1-5 % GEL Take by mouth 2 (two) times daily.    Specialty Vitamins Products (MAGNESIUM, AMINO ACID CHELATE,) 133 MG tablet Take 1 tablet by mouth at bedtime.    Vitamin D, Ergocalciferol, (DRISDOL) 1.25 MG (50000 UNIT) CAPS capsule Take one capsule by mouth weekly    zolpidem (AMBIEN) 10 MG tablet One before bed    Facility-Administered Encounter Medications as of 09/14/2021  Medication   sodium chloride flush (NS) 0.9 % injection 10 mL    [facility] Clinical Pharmacist Assistant [phone]

## 2021-09-15 ENCOUNTER — Other Ambulatory Visit: Payer: Self-pay | Admitting: Hematology and Oncology

## 2021-09-15 ENCOUNTER — Telehealth: Payer: Self-pay

## 2021-09-15 NOTE — Progress Notes (Signed)
Chronic Care Management Pharmacy Assistant   Name: Hermila Millis  MRN: [id number] DOB: [birth year]   Reason for Encounter: Medication Coordination for [facility]    Recent office visits:  None  Recent consult visits:  09/13/21 (oncology) [name] MD. Orders Only. Ordered Medrol Dosepak 58m and Albuterol Inhaler and Augmentin 875-1213m   09/13/21 (Oncology) McHosie PoissonD. Seen for BRCA Postive. No med changes.  Hospital visits:  None  Medications: Outpatient Encounter Medications as of 09/15/2021  Medication Sig Note   albuterol (VENTOLIN HFA) 108 (90 Base) MCG/ACT inhaler Inhale 2 puffs into the lungs every 6 (six) hours as needed for wheezing or shortness of breath.    amoxicillin-clavulanate (AUGMENTIN) 875-125 MG tablet Take 1 tablet by mouth 2 (two) times daily.    atorvastatin (LIPITOR) 10 MG tablet Take 1 tablet (10 mg total) by mouth daily.    benzonatate (TESSALON) 100 MG capsule Take 1 capsule (100 mg total) by mouth 2 (two) times daily as needed for cough. (Patient not taking: Reported on 08/17/2021)    Blood Glucose Monitoring Suppl (ONETOUCH VERIO REFLECT) w/Device KIT AS DIRECTED    buPROPion (WELLBUTRIN XL) 300 MG 24 hr tablet Take 1 tablet (300 mg total) by mouth every evening.    busPIRone (BUSPAR) 5 MG tablet Take 1 tablet (5 mg total) by mouth 3 (three) times daily.    calcium citrate-vitamin D (CITRACAL+D) 315-200 MG-UNIT tablet Take 1 tablet by mouth 2 (two) times daily.    clotrimazole-betamethasone (LOTRISONE) cream Apply twice daily as needed to area of concern (Patient not taking: Reported on 08/17/2021)    Continuous Blood Gluc Receiver (FREESTYLE LIBRE 2 READER) DEVI E11.69 Check blood sugar 4 times daily as directed    Continuous Blood Gluc Sensor (FREESTYLE LIBRE 2 SENSOR) MISC E11.69 Change sensor every 14 days as directed    cyclobenzaprine (FLEXERIL) 10 MG tablet TAKE ONE TABLET BY MOUTH EVERY 8 HOURS AS NEEDED FOR muscle SPASMS     dicyclomine (BENTYL) 20 MG tablet TAKE ONE TABLET BY MOUTH BEFORE MEALS AND AT BEDTIME AS NEEDED FOR STOMACH CRAMPING    Dulaglutide (TRULICITY) 4.5 MGBM/8.4XLOPN Inject 4.5 mg as directed once a week.    Empagliflozin-metFORMIN HCl (SYNJARDY) 12.05-998 MG TABS Take 1 tablet by mouth 2 (two) times daily.    famotidine (PEPCID) 20 MG tablet Take 1 tablet (20 mg total) by mouth 2 (two) times daily.    ferrous sulfate 325 (65 FE) MG tablet Take 325 mg by mouth every evening.    gabapentin (NEURONTIN) 300 MG capsule Take 2 capsules (600 mg total) by mouth 3 (three) times daily.    insulin degludec (TRESIBA FLEXTOUCH) 200 UNIT/ML FlexTouch Pen Inject 52 Units into the skin daily.    Levomilnacipran HCl ER (FETZIMA) 80 MG CP24 Take 1 capsule by mouth daily.    levothyroxine (SYNTHROID) 75 MCG tablet Take 1 tablet (75 mcg total) by mouth daily.    lisdexamfetamine (VYVANSE) 70 MG capsule Take 1 capsule (70 mg total) by mouth daily.    loratadine (CLARITIN) 10 MG tablet Take 10 mg by mouth daily. Taking 3-4 days each week due to bone pain associated with shot from cancer center.    losartan (COZAAR) 100 MG tablet Take 1 tablet (100 mg total) by mouth daily.    Magnesium 500 MG CAPS 500 mg.    methylPREDNISolone (MEDROL DOSEPAK) 4 MG TBPK tablet Take 6 pills the first day by mouth, 5 pills the next day, 4 pills  the next day, 3 pills the next day, 2 pills the next day and 1 pill the following day and then stop    morphine (MS CONTIN) 30 MG 12 hr tablet Take 1 tablet (30 mg total) by mouth every 12 (twelve) hours.    Multiple Vitamin (MULTIVITAMIN WITH MINERALS) TABS tablet Take 1 tablet by mouth daily.  06/08/2018: Pt plans on purchasing again when she can get out.   omega-3 acid ethyl esters (LOVAZA) 1 g capsule Take 2 capsules (2 g total) by mouth 2 (two) times daily.    ondansetron (ZOFRAN) 4 MG tablet Take 1 tablet (4 mg total) by mouth every 4 (four) hours as needed for nausea.    OneTouch Delica  Lancets 76H MISC 1 each by Does not apply route daily before breakfast. Check blood sugar twice daily.    pantoprazole (PROTONIX) 40 MG tablet Take 1 tablet (40 mg total) by mouth 2 (two) times daily.    potassium chloride (MICRO-K) 10 MEQ CR capsule Take 2 capsules (20 mEq total) by mouth 2 (two) times daily.    prochlorperazine (COMPAZINE) 10 MG tablet TAKE ONE TABLET BY MOUTH every SIX hours as needed FOR nausea OR vomiting    rOPINIRole (REQUIP) 0.25 MG tablet Take 1 tablet (0.25 mg total) by mouth at bedtime.    SODIUM FLUORIDE 5000 SENSITIVE 1.1-5 % GEL Take by mouth 2 (two) times daily.    Specialty Vitamins Products (MAGNESIUM, AMINO ACID CHELATE,) 133 MG tablet Take 1 tablet by mouth at bedtime.    Vitamin D, Ergocalciferol, (DRISDOL) 1.25 MG (50000 UNIT) CAPS capsule Take one capsule by mouth weekly    zolpidem (AMBIEN) 10 MG tablet One before bed    Facility-Administered Encounter Medications as of 09/15/2021  Medication   sodium chloride flush (NS) 0.9 % injection 10 mL    Reviewed chart for medication changes ahead of medication coordination call.  No OVs, or hospital visits since last care coordination call/Pharmacist visit.   No medication changes indicated OR if recent visit, treatment plan here.  BP Readings from Last 3 Encounters:  09/13/21 135/73  07/29/21 140/70  07/14/21 124/72    Lab Results  Component Value Date   HGBA1C 7.6 (H) 06/23/2021     Patient obtains medications through Adherence Packaging  30 Days   Last adherence delivery included:  Tresiba Flex Inj 200u- inject 52 units daily      Atorvastatin 80m 1 EM Synjardy 12.5-1000mg 1 B 1 EM Famotidine 29m1 B 1 EM Lostartan Pot 5076m EM Bupropion HCI XL 300m60mB Levothyroxine 75mc8mBB Pantoprazole 40mg 61m1 EM Zolpidem 10mg 141mPotassium Chloride ER 10MEQ 2 B 2 EM Fetzima ER 80mg  153mMorphine Sulfate ER 30 mg 1 tab every 12 hours (Bottle)       Buspirone 5mg 1 B 59m1 EM Vyvanse 70mg 1  B81mpinirole 0.25mg 1 BT 18mza 1gm 2 B 2 EM Gabapentin 300mg 2 caps11m three times daily (Bottle)      Trulicity 4.5 inject 4.5mg once w6.0VP             Vitamin D2 1250mcg 1 B on82mdays Freestyle Libre 2 sensors change every 14 days       Prochlorperazine 10mg 1 tab ev77m6 hours as needed (Bottle)        Ondansetron 4mg 1 tab ever1m hours as needed (Bottle) Cyclobenzaprine 10mg 1 tab ever32mhours as needed (Bottle)  Patient declined (meds) last month  None  Patient is due for next adherence delivery on: 09/27/21. Called patient and reviewed medications and coordinated delivery.  This delivery to include: Tresiba Flex Inj 200u- inject 52 units daily      Atorvastatin 93m 1 EM Synjardy 12.5-1000mg 1 B 1 EM Famotidine 290m1 B 1 EM Lostartan Pot 10025m EM Bupropion HCI XL 300m61mB Levothyroxine 75mc20mBB Zolpidem 10mg 69m Potassium Chloride ER 10MEQ 2 B 2 EM Fetzima ER 80mg  47m Morphine Sulfate ER 30 mg 1 tab every 12 hours (Bottle)       Buspirone 5mg 1 B15mL1 EM Vyvanse 34mg 1 B28mpinirole 0.25mg 1 BT92maza 1gm 2 B 2 EM Gabapentin 300mg 2 cap39ms three times daily (Bottle)      Trulicity 4.5 inject 4.5mg once 1.5VVy             Vitamin D2 1250mcg 1 B o97midays Freestyle Libre 2 sensors change every 14 days       Prochlorperazine 10mg 1 tab e18m 6 hours as needed (Bottle)        Cyclobenzaprine 10mg 1 tab ev61m8 hours as needed (Bottle) Onetouch Delica Lancets 30g Onetouch V61Yo Test Strips   Patient declined the following medications Pantoprazole 40mg- has enou18mn hand, uses prn  Ondansetron 4mg- has enough84m hand, uses prn   Patient needs refills-Request Sent  Morphine Sulfate ER 30 mg  Buspirone 5mg  Vyvanse 34m20m Ropinirole3m5mg  Lovaza 1gm  82mpentin 300mg  Trulicity 4.073XTect  Zolpidem 10mg Tresiba Flex I39m00u Onetouch Verio Test Strips  Onetouch Delica Lancets 30g  Confirmed [id number] date of 09/27/21, advised patient that  pharmacy will contact them the morning of delivery.   Danielle Gerringer, Elray McgregoracLaconiat  [phone]

## 2021-09-16 ENCOUNTER — Other Ambulatory Visit: Payer: Self-pay

## 2021-09-16 DIAGNOSIS — E782 Mixed hyperlipidemia: Secondary | ICD-10-CM

## 2021-09-16 DIAGNOSIS — E1121 Type 2 diabetes mellitus with diabetic nephropathy: Secondary | ICD-10-CM

## 2021-09-16 DIAGNOSIS — F5081 Binge eating disorder: Secondary | ICD-10-CM

## 2021-09-16 DIAGNOSIS — Z1501 Genetic susceptibility to malignant neoplasm of breast: Secondary | ICD-10-CM

## 2021-09-16 DIAGNOSIS — G2581 Restless legs syndrome: Secondary | ICD-10-CM

## 2021-09-16 MED ORDER — ONETOUCH DELICA LANCETS 30G MISC: 1.0000 | Freq: Every day | 3 refills | Status: DC

## 2021-09-16 MED ORDER — ONETOUCH VERIO VI STRP: ORAL_STRIP | 12 refills | Status: DC

## 2021-09-16 MED ORDER — TRULICITY 4.5 MG/0.5ML ~~LOC~~ SOAJ: 4.5000 mg | SUBCUTANEOUS | 0 refills | Status: DC

## 2021-09-16 MED ORDER — LISDEXAMFETAMINE DIMESYLATE 70 MG PO CAPS: 70.0000 mg | ORAL_CAPSULE | Freq: Every day | ORAL | 0 refills | Status: DC

## 2021-09-16 MED ORDER — ROPINIROLE HCL 0.25 MG PO TABS: 0.2500 mg | ORAL_TABLET | Freq: Every day | ORAL | 1 refills | Status: DC

## 2021-09-16 MED ORDER — BUSPIRONE HCL 5 MG PO TABS
5.0000 mg | ORAL_TABLET | Freq: Three times a day (TID) | ORAL | 2 refills | Status: DC
Start: 2021-09-16 — End: 2021-12-13

## 2021-09-16 MED ORDER — GABAPENTIN 300 MG PO CAPS: 600.0000 mg | ORAL_CAPSULE | Freq: Three times a day (TID) | ORAL | 1 refills | Status: DC

## 2021-09-16 MED ORDER — ZOLPIDEM TARTRATE 10 MG PO TABS: ORAL_TABLET | ORAL | 5 refills | Status: DC

## 2021-09-16 MED ORDER — MORPHINE SULFATE ER 30 MG PO TBCR: 30.0000 mg | EXTENDED_RELEASE_TABLET | Freq: Two times a day (BID) | ORAL | 0 refills | Status: DC

## 2021-09-16 MED ORDER — OMEGA-3-ACID ETHYL ESTERS 1 G PO CAPS: 2.0000 g | ORAL_CAPSULE | Freq: Two times a day (BID) | ORAL | 1 refills | Status: DC

## 2021-09-16 MED ORDER — TRESIBA FLEXTOUCH 200 UNIT/ML ~~LOC~~ SOPN
52.0000 [IU] | PEN_INJECTOR | Freq: Every day | SUBCUTANEOUS | 3 refills | Status: DC
Start: 2021-09-16 — End: 2022-01-14

## 2021-09-17 ENCOUNTER — Ambulatory Visit (INDEPENDENT_AMBULATORY_CARE_PROVIDER_SITE_OTHER): Payer: HMO | Admitting: Family Medicine

## 2021-09-17 VITALS — BP 124/74 | HR 88 | Temp 97.4°F | Resp 18 | Ht 62.0 in | Wt 206.4 lb

## 2021-09-17 DIAGNOSIS — E1169 Type 2 diabetes mellitus with other specified complication: Secondary | ICD-10-CM

## 2021-09-17 DIAGNOSIS — J208 Acute bronchitis due to other specified organisms: Secondary | ICD-10-CM | POA: Diagnosis not present

## 2021-09-17 DIAGNOSIS — R634 Abnormal weight loss: Secondary | ICD-10-CM | POA: Insufficient documentation

## 2021-09-17 DIAGNOSIS — E785 Hyperlipidemia, unspecified: Secondary | ICD-10-CM

## 2021-09-17 NOTE — Assessment & Plan Note (Signed)
Complete augmentin and prednisone.

## 2021-09-17 NOTE — Assessment & Plan Note (Signed)
Continue to increase insulin if sugars are up on prednisone.

## 2021-09-17 NOTE — Progress Notes (Signed)
Acute Office Visit  Subjective:    Patient ID: Linda [name], female    DOB: [birth year], 51 y.o.   MRN: [id number]  Chief Complaint  Patient presents with   Cough    HPI: Patient is in today for cough. She has had cough for about two months. She recently saw Dr Hinton Rao who prescribed augmentin and medrol pack. Patient is feeling better. Patient had recent full body CT scan due to hx of cancer, states this showed place on lung. Hinton Rao is unsure if this is due to infection, patient is to repeat scan in 3 months.  Increase insulin to 64 U first day of prednisone. Sugar jumped to 400. It has improved as her prednisone has been tapered. Her most recent sugar was 150. She is adjusting her insulin as it comes down.   Patient has not had change of appetite. She has lost two pounds since 8/21. Patient states at Dr Remi Deter office on 8/21 they informed her she had lost 8 pounds. Patient is not trying to lose weight. Weight loss has occurred since going on trulicity 4.5 mg weekly.    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 No longer has    Past Surgical History:  Procedure Laterality Date   APPENDECTOMY     BACK SURGERY     x 3 lower disc   BILATERAL TOTAL MASTECTOMY WITH AXILLARY LYMPH NODE DISSECTION Bilateral 09/2019   CHOLECYSTECTOMY     nephrolithiasis     ROBOTIC ASSISTED TOTAL HYSTERECTOMY WITH BILATERAL SALPINGO OOPHERECTOMY Bilateral 06/14/2016   Procedure: ROBOTIC ASSISTED TOTAL HYSTERECTOMY WITH BILATERAL SALPINGO OOPHORECTOMY;  Surgeon: Nancy Marus, MD;  Location: [facility];  Service: Gynecology;  Laterality: Bilateral;    Family History  Problem Relation Age of Onset   Cancer Mother        breast   CAD Father    Diabetes Father    Heart failure Father    Cancer Father        renal carcinoma   Kidney failure Father    Cancer Brother 39       renal carcinoma.   Stroke Paternal Grandmother     Social History   Socioeconomic History   Marital status: Divorced    Spouse name: Not on file   Number of children: Not on file  Years of education: Not on file   Highest education level: Not on file  Occupational History   Occupation: disabled  Tobacco Use   Smoking status: Never   Smokeless tobacco: Never  Substance and Sexual Activity   Alcohol use: No   Drug use: No   Sexual activity: Yes  Other Topics Concern   Not on file  Social History Narrative   wears sunscreen, brushes and flosses daily, see's dentist bi-annually, has smoke/carbon monoxide detectors, wears a seatbelt and practices gun safety   Social Determinants of Health   Financial Resource Strain: High Risk (09/01/2021)   Overall Financial Resource Strain (CARDIA)    Difficulty of Paying Living Expenses: Hard  Food Insecurity: Food Insecurity Present (06/30/2021)   Hunger Vital Sign    Worried About Running Out of Food in the Last Year: Often true    Ran Out of Food in the Last Year:  Often true  Transportation Needs: Unmet Transportation Needs (09/01/2021)   PRAPARE - Hydrologist (Medical): Yes    Lack of Transportation (Non-Medical): Yes  Physical Activity: Not on file  Stress: Stress Concern Present (07/01/2021)   [place]    Feeling of Stress : To some extent  Social Connections: Not on file  Intimate Partner Violence: Not on file    Outpatient Medications Prior to Visit  Medication Sig Dispense Refill   albuterol (VENTOLIN HFA) 108 (90 Base) MCG/ACT inhaler Inhale 2 puffs into the lungs every 6 (six) hours as needed for wheezing or shortness of breath. 8 g 1   amoxicillin-clavulanate (AUGMENTIN) 875-125 MG tablet Take 1 tablet by mouth 2 (two) times daily. 20 tablet 0   atorvastatin (LIPITOR) 10 MG tablet Take 1 tablet (10 mg total) by mouth daily. 90 tablet 0   benzonatate (TESSALON) 100 MG capsule Take 1 capsule (100 mg total) by mouth 2 (two) times daily as needed for cough. 30 capsule 0   Blood Glucose Monitoring Suppl (ONETOUCH VERIO REFLECT) w/Device KIT AS DIRECTED     buPROPion (WELLBUTRIN XL) 300 MG 24 hr tablet Take 1 tablet (300 mg total) by mouth every evening. 90 tablet 1   busPIRone (BUSPAR) 5 MG tablet Take 1 tablet (5 mg total) by mouth 3 (three) times daily. 90 tablet 2   calcium citrate-vitamin D (CITRACAL+D) 315-200 MG-UNIT tablet Take 1 tablet by mouth 2 (two) times daily.     clotrimazole-betamethasone (LOTRISONE) cream Apply twice daily as needed to area of concern 45 g 2   Continuous Blood Gluc Receiver (FREESTYLE LIBRE 2 READER) DEVI E11.69 Check blood sugar 4 times daily as directed 1 each 0   Continuous Blood Gluc Sensor (FREESTYLE LIBRE 2 SENSOR) MISC E11.69 Change sensor every 14 days as directed 6 each 3   dicyclomine (BENTYL) 20 MG tablet TAKE ONE TABLET BY MOUTH BEFORE MEALS AND AT BEDTIME AS NEEDED FOR STOMACH CRAMPING 180 tablet 1    Dulaglutide (TRULICITY) 4.5 XM/4.6OE SOPN Inject 4.5 mg as directed once a week. 6 mL 0   Empagliflozin-metFORMIN HCl (SYNJARDY) 12.05-998 MG TABS Take 1 tablet by mouth 2 (two) times daily. 180 tablet 0   famotidine (PEPCID) 20 MG tablet Take 1 tablet (20 mg total) by mouth 2 (two) times daily. 180 tablet 1   ferrous sulfate 325 (65 FE) MG tablet Take 325 mg by mouth every evening.     gabapentin (NEURONTIN) 300 MG capsule Take 2 capsules (600 mg  total) by mouth 3 (three) times daily. 180 capsule 1   glucose blood (ONETOUCH VERIO) test strip Use as instructed 100 each 12   insulin degludec (TRESIBA FLEXTOUCH) 200 UNIT/ML FlexTouch Pen Inject 52 Units into the skin daily. 9 mL 3   Levomilnacipran HCl ER (FETZIMA) 80 MG CP24 Take 1 capsule by mouth daily. 90 capsule 1   levothyroxine (SYNTHROID) 75 MCG tablet Take 1 tablet (75 mcg total) by mouth daily. 90 tablet 1   lisdexamfetamine (VYVANSE) 70 MG capsule Take 1 capsule (70 mg total) by mouth daily. 30 capsule 0   loratadine (CLARITIN) 10 MG tablet Take 10 mg by mouth daily. Taking 3-4 days each week due to bone pain associated with shot from cancer center.     losartan (COZAAR) 100 MG tablet Take 1 tablet (100 mg total) by mouth daily. 90 tablet 0   Magnesium 500 MG CAPS 500 mg.     methylPREDNISolone (MEDROL DOSEPAK) 4 MG TBPK tablet Take 6 pills the first day by mouth, 5 pills the next day, 4 pills the next day, 3 pills the next day, 2 pills the next day and 1 pill the following day and then stop 21 tablet 0   morphine (MS CONTIN) 30 MG 12 hr tablet Take 1 tablet (30 mg total) by mouth every 12 (twelve) hours. 60 tablet 0   Multiple Vitamin (MULTIVITAMIN WITH MINERALS) TABS tablet Take 1 tablet by mouth daily.      omega-3 acid ethyl esters (LOVAZA) 1 g capsule Take 2 capsules (2 g total) by mouth 2 (two) times daily. 360 capsule 1   ondansetron (ZOFRAN) 4 MG tablet Take 1 tablet (4 mg total) by mouth every 4 (four) hours as needed for nausea.  90 tablet 3   OneTouch Delica Lancets 03E MISC 1 each by Does not apply route daily before breakfast. Check blood sugar twice daily. 100 each 3   pantoprazole (PROTONIX) 40 MG tablet Take 1 tablet (40 mg total) by mouth 2 (two) times daily. 180 tablet 1   potassium chloride (MICRO-K) 10 MEQ CR capsule Take 2 capsules (20 mEq total) by mouth 2 (two) times daily. 360 capsule 0   prochlorperazine (COMPAZINE) 10 MG tablet TAKE ONE TABLET BY MOUTH every SIX hours as needed FOR nausea OR vomiting 90 tablet 0   rOPINIRole (REQUIP) 0.25 MG tablet Take 1 tablet (0.25 mg total) by mouth at bedtime. 90 tablet 1   SODIUM FLUORIDE 5000 SENSITIVE 1.1-5 % GEL Take by mouth 2 (two) times daily.     Specialty Vitamins Products (MAGNESIUM, AMINO ACID CHELATE,) 133 MG tablet Take 1 tablet by mouth at bedtime.     Vitamin D, Ergocalciferol, (DRISDOL) 1.25 MG (50000 UNIT) CAPS capsule Take one capsule by mouth weekly 12 capsule 1   zolpidem (AMBIEN) 10 MG tablet One before bed 30 tablet 5   cyclobenzaprine (FLEXERIL) 10 MG tablet TAKE ONE TABLET BY MOUTH EVERY 8 HOURS AS NEEDED FOR muscle SPASMS 30 tablet 3   Facility-Administered Medications Prior to Visit  Medication Dose Route Frequency Provider Last Rate Last Admin   sodium chloride flush (NS) 0.9 % injection 10 mL  10 mL Intracatheter PRN Mosher, Kelli A, PA-C   10 mL at 02/19/21 1004    Allergies  Allergen Reactions   Codeine Shortness Of Breath    Other reaction(s): SHOB   Celecoxib Other (See Comments)    Unknown reaction Other reaction(s): Unknown   Ezetimibe     Other reaction(s):  Unknown   Ezetimibe-Simvastatin Other (See Comments)    Unknown reaction   Propranolol     Other reaction(s): Unknown   Propranolol Hcl Other (See Comments)    Unknown reaction   Simvastatin     Other reaction(s): Unknown    Review of Systems  Constitutional:  Positive for unexpected weight change (decrease). Negative for appetite change, fatigue and fever.   HENT:  Negative for congestion, ear pain, sinus pressure and sore throat.   Respiratory:  Positive for cough. Negative for chest tightness, shortness of breath and wheezing.   Cardiovascular:  Negative for chest pain and palpitations.  Gastrointestinal:  Negative for abdominal pain, constipation, diarrhea, nausea and vomiting.  Musculoskeletal:  Negative for arthralgias, back pain, joint swelling and myalgias.  Skin:  Negative for rash.  Neurological:  Negative for dizziness, weakness and headaches.  Psychiatric/Behavioral:  Negative for dysphoric mood. The patient is not nervous/anxious.        Objective:    Physical Exam Vitals reviewed.  Constitutional:      Appearance: Normal appearance. She is obese.  HENT:     Right Ear: Tympanic membrane normal.     Left Ear: Tympanic membrane normal.     Nose: Nose normal.     Mouth/Throat:     Pharynx: No oropharyngeal exudate or posterior oropharyngeal erythema.     Comments: Enlarged tonsils.  Cardiovascular:     Rate and Rhythm: Normal rate and regular rhythm.     Heart sounds: Normal heart sounds.  Pulmonary:     Effort: Pulmonary effort is normal.     Breath sounds: Normal breath sounds.  Lymphadenopathy:     Cervical: Cervical adenopathy present.  Neurological:     Mental Status: She is alert.     BP 124/74   Pulse 88   Temp (!) 97.4 F (36.3 C)   Resp 18   Ht _0  (1.575 m)   Wt 206 lb 6.4 oz (93.6 kg)   LMP 06/13/2009   SpO2 98%   BMI 37.75 kg/m  Wt Readings from Last 3 Encounters:  09/17/21 206 lb 6.4 oz (93.6 kg)  09/13/21 208 lb 6.4 oz (94.5 kg)  07/29/21 216 lb 6.4 oz (98.2 kg)    Health Maintenance Due  Topic Date Due   Zoster Vaccines- Shingrix (1 of 2) Never done   COVID-19 Vaccine (6 - Pfizer risk series) 01/11/2021   OPHTHALMOLOGY EXAM  04/30/2021   INFLUENZA VACCINE  08/24/2021    There are no preventive care reminders to display for this patient.   Lab Results  Component Value Date    TSH 1.700 06/23/2021   Lab Results  Component Value Date   WBC 4.7 09/13/2021   HGB 12.2 09/13/2021   HCT 38 09/13/2021   MCV 81 06/25/2021   PLT 91 (A) 09/13/2021   Lab Results  Component Value Date   NA 138 09/13/2021   K 4.0 09/13/2021   CO2 28 (A) 09/13/2021   GLUCOSE 145 (H) 06/23/2021   BUN 12 09/13/2021   CREATININE 0.8 09/13/2021   BILITOT 0.6 06/23/2021   ALKPHOS 111 09/13/2021   AST 30 09/13/2021   ALT 23 09/13/2021   PROT 6.7 06/23/2021   ALBUMIN 3.9 09/13/2021   CALCIUM 8.9 09/13/2021   ANIONGAP 9 11/27/2019   EGFR 61 06/23/2021   Lab Results  Component Value Date   CHOL 145 06/23/2021   Lab Results  Component Value Date   HDL 50 06/23/2021   Lab  Results  Component Value Date   LDLCALC 69 06/23/2021   Lab Results  Component Value Date   TRIG 150 (H) 06/23/2021   Lab Results  Component Value Date   CHOLHDL 2.9 06/23/2021   Lab Results  Component Value Date   HGBA1C 7.6 (H) 06/23/2021       Assessment & Plan:   Problem List Items Addressed This Visit       Respiratory   Acute bronchitis due to other specified organisms - Primary    Complete augmentin and prednisone.          Endocrine   Dyslipidemia associated with type 2 diabetes mellitus ([place])    Continue to increase insulin if sugars are up on prednisone.         Other   Weight loss, unintentional    Although unintentional, I did increase trulicity to 4.5 fairly recently and this may have suppressed her appetite. Also on vyvanse.         Follow-up: Return in about 2 weeks (around 10/01/2021) for chronic fasting.  An After Visit Summary was printed and given to the patient.  [name], MD [facility] [phone]

## 2021-09-17 NOTE — Assessment & Plan Note (Signed)
Although unintentional, I did increase trulicity to 4.5 fairly recently and this may have suppressed her appetite. Also on vyvanse.

## 2021-09-23 ENCOUNTER — Other Ambulatory Visit: Payer: Self-pay | Admitting: Hematology and Oncology

## 2021-09-23 ENCOUNTER — Encounter: Payer: HMO | Admitting: *Deleted

## 2021-09-23 ENCOUNTER — Other Ambulatory Visit: Payer: Self-pay | Admitting: Family Medicine

## 2021-09-23 DIAGNOSIS — F32A Depression, unspecified: Secondary | ICD-10-CM | POA: Diagnosis not present

## 2021-09-23 DIAGNOSIS — Z794 Long term (current) use of insulin: Secondary | ICD-10-CM | POA: Diagnosis not present

## 2021-09-23 DIAGNOSIS — E782 Mixed hyperlipidemia: Secondary | ICD-10-CM | POA: Diagnosis not present

## 2021-09-23 DIAGNOSIS — R110 Nausea: Secondary | ICD-10-CM

## 2021-09-23 DIAGNOSIS — I10 Essential (primary) hypertension: Secondary | ICD-10-CM

## 2021-09-23 DIAGNOSIS — M62838 Other muscle spasm: Secondary | ICD-10-CM

## 2021-09-23 DIAGNOSIS — E1159 Type 2 diabetes mellitus with other circulatory complications: Secondary | ICD-10-CM

## 2021-09-23 DIAGNOSIS — E1140 Type 2 diabetes mellitus with diabetic neuropathy, unspecified: Secondary | ICD-10-CM | POA: Diagnosis not present

## 2021-09-23 NOTE — Progress Notes (Signed)
Note from pharmacy: Vyvanse 5m is on back order from our source right now and I'm pretty sure it has been now for a few weeks... so we wont be able to order that for her to fill in her packs Monday for her. The doctor is going to have to probably send a new script to another pharmacy for it to be filled elsewhere but its a manufacturer backorder so not sure if other pharmacies are having same issue.  Called pt to inform her and find out where else she would like this sent and I had to leave a detailed message to call me back

## 2021-09-24 ENCOUNTER — Encounter: Payer: Self-pay | Admitting: Family Medicine

## 2021-09-28 ENCOUNTER — Ambulatory Visit: Payer: Self-pay | Admitting: *Deleted

## 2021-09-30 ENCOUNTER — Telehealth: Payer: Self-pay | Admitting: *Deleted

## 2021-09-30 NOTE — Patient Outreach (Signed)
  Care Coordination   Follow Up Visit Note   09/30/2021 Name: Brena Windsor MRN: [id number] DOB: 1970-09-25  Debie Ashline is a 51 y.o. year old female who sees [name], Elnita Maxwell, MD for primary care. I spoke with  [name] by phone today.  What matters to the patients health and wellness today?  Getting linked with mental health support/counseling    Goals Addressed             This Visit's Progress    Reduce depression/symptoms       Care Coordination Interventions:  Depression screen reviewed  Solution-Focused Strategies employed:  Active listening / Reflection utilized  Reviewed mental health medications and discussed importance of compliance:   Participation in counseling encouraged  Made referral to    [facility]         SDOH assessments and interventions completed:  Yes     Care Coordination Interventions Activated:  Yes  Care Coordination Interventions:  Yes, provided   Follow up plan: Referral made to [facility] behavioral health team Follow up call scheduled for 10/11/21    Encounter Outcome:  Pt. Visit Completed   Eduard Clos MSW, LCSW Licensed Clinical Social Worker    [phone]

## 2021-09-30 NOTE — Progress Notes (Signed)
Subjective:  Patient ID: Linda [name], female    DOB: 12-May-1970  Age: 51 y.o. MRN: [id number]  Chief Complaint  Patient presents with   Diabetes    HPI Diabetes:  Complications: HYPERTENSION, CKD Glucose checking: CGM Glucose logs: 180-400. Patient has had steroids twice in the last month.  Hypoglycemia: no  Most recent A1C: 7.6 Current medications: Trulicity 3 mg injection once weekly, Synjardy 12.5 mg/ 1000 mg 1 tablet twice daily, Tresiba inject 52 units daily. Last Eye Exam: 05/29/21 Foot checks: Daily  Hyperlipidemia: Current medications: Taking Atorvastatin 10 mg 1 tablet daily, lovaza 1 gm 2 capsules twice daily.   Hypertensive kidney disease with Stage 3B chronic kidney disease: Complications: DM Current medications: Losartan potassium 50 mg take 1 tablet daily, Potassium Chloride 10 meq take 2 capsules BID  Acquired Hypothyroidism: Patient is currently taking Levothyroxine 75 mg daily.  Chronic Pain Syndrome: Chronic back pain  has worsened. On Morphine 30 mg take 1 tablet twice daily, Gabapentin 300 mg take 2 capsules three times a day.   Binge eating disorder: Patient is currently taking Vyvanse 50 mg 1 capsule daily. I had increased vyvanse 70 mg daily, but unavailable. Patient to use up some 50 mg qam that she has. She has been binging some, but better.    Vitamin D deficiency: Patient is currently taking Vitamin D 50,000 units 1 capsule weekly.  GERD: [facility] did not send Pantoprazole 40 mg 1 tablet TWICE DAILY in pill pack, Famotidine 20 mg 1 tablet twice daily .  Depression, mild, recurrent: Patient is currently taking Fetzima 80 mg daily, Bupropion 300 mg 1 tablet daily, Buspirone 5 mg take 1 tablet three times a day. Gets depressed intermittently. Medications working well.   Acute bronchitis: on augmentin, but not taking every day because of diarrhea. I told her to discontinue augmentin last night.   IBS D: has dicyclomine that uses as needed.    Current Outpatient Medications on File Prior to Visit  Medication Sig Dispense Refill   albuterol (VENTOLIN HFA) 108 (90 Base) MCG/ACT inhaler Inhale 2 puffs into the lungs every 6 (six) hours as needed for wheezing or shortness of breath. 8 g 1   atorvastatin (LIPITOR) 10 MG tablet Take 1 tablet (10 mg total) by mouth daily. 90 tablet 0   benzonatate (TESSALON) 100 MG capsule Take 1 capsule (100 mg total) by mouth 2 (two) times daily as needed for cough. 30 capsule 0   Blood Glucose Monitoring Suppl (ONETOUCH VERIO REFLECT) w/Device KIT AS DIRECTED     buPROPion (WELLBUTRIN XL) 300 MG 24 hr tablet Take 1 tablet (300 mg total) by mouth every evening. 90 tablet 1   busPIRone (BUSPAR) 5 MG tablet Take 1 tablet (5 mg total) by mouth 3 (three) times daily. 90 tablet 2   calcium citrate-vitamin D (CITRACAL+D) 315-200 MG-UNIT tablet Take 1 tablet by mouth 2 (two) times daily.     clotrimazole-betamethasone (LOTRISONE) cream Apply twice daily as needed to area of concern 45 g 2   Continuous Blood Gluc Receiver (FREESTYLE LIBRE 2 READER) DEVI E11.69 Check blood sugar 4 times daily as directed 1 each 0   Continuous Blood Gluc Sensor (FREESTYLE LIBRE 2 SENSOR) MISC E11.69 Change sensor every 14 days as directed 6 each 3   cyclobenzaprine (FLEXERIL) 10 MG tablet TAKE ONE TABLET BY MOUTH every EIGHT hours AS NEEDED FOR muscle SPASMS 30 tablet 3   dicyclomine (BENTYL) 20 MG tablet TAKE ONE TABLET BY MOUTH BEFORE MEALS  AND AT BEDTIME AS NEEDED FOR STOMACH CRAMPING 180 tablet 1   Dulaglutide (TRULICITY) 4.5 NL/9.7QB SOPN Inject 4.5 mg as directed once a week. 6 mL 0   Empagliflozin-metFORMIN HCl (SYNJARDY) 12.05-998 MG TABS Take 1 tablet by mouth 2 (two) times daily. 180 tablet 0   famotidine (PEPCID) 20 MG tablet Take 1 tablet (20 mg total) by mouth 2 (two) times daily. 180 tablet 1   ferrous sulfate 325 (65 FE) MG tablet Take 325 mg by mouth every evening.     gabapentin (NEURONTIN) 300 MG capsule Take 2  capsules (600 mg total) by mouth 3 (three) times daily. 180 capsule 1   glucose blood (ONETOUCH VERIO) test strip Use as instructed 100 each 12   insulin degludec (TRESIBA FLEXTOUCH) 200 UNIT/ML FlexTouch Pen Inject 52 Units into the skin daily. 9 mL 3   Levomilnacipran HCl ER (FETZIMA) 80 MG CP24 Take 1 capsule by mouth daily. 90 capsule 1   levothyroxine (SYNTHROID) 75 MCG tablet Take 1 tablet (75 mcg total) by mouth daily. 90 tablet 1   losartan (COZAAR) 100 MG tablet Take 1 tablet (100 mg total) by mouth daily. 90 tablet 0   Magnesium 500 MG CAPS 500 mg.     morphine (MS CONTIN) 30 MG 12 hr tablet Take 1 tablet (30 mg total) by mouth every 12 (twelve) hours. 60 tablet 0   Multiple Vitamin (MULTIVITAMIN WITH MINERALS) TABS tablet Take 1 tablet by mouth daily.      omega-3 acid ethyl esters (LOVAZA) 1 g capsule Take 2 capsules (2 g total) by mouth 2 (two) times daily. 360 capsule 1   ondansetron (ZOFRAN) 4 MG tablet Take 1 tablet (4 mg total) by mouth every 4 (four) hours as needed for nausea. 90 tablet 3   OneTouch Delica Lancets 34L MISC 1 each by Does not apply route daily before breakfast. Check blood sugar twice daily. 100 each 3   potassium chloride (MICRO-K) 10 MEQ CR capsule Take 2 capsules (20 mEq total) by mouth 2 (two) times daily. 360 capsule 0   rOPINIRole (REQUIP) 0.25 MG tablet Take 1 tablet (0.25 mg total) by mouth at bedtime. 90 tablet 1   Vitamin D, Ergocalciferol, (DRISDOL) 1.25 MG (50000 UNIT) CAPS capsule Take one capsule by mouth weekly 12 capsule 1   zolpidem (AMBIEN) 10 MG tablet One before bed 30 tablet 5   lisdexamfetamine (VYVANSE) 70 MG capsule Take 1 capsule (70 mg total) by mouth daily. (Patient not taking: Reported on 09/30/2021) 30 capsule 0   pantoprazole (PROTONIX) 40 MG tablet Take 1 tablet (40 mg total) by mouth 2 (two) times daily. (Patient not taking: Reported on 09/30/2021) 180 tablet 1   Current Facility-Administered Medications on File Prior to Visit   Medication Dose Route Frequency Provider Last Rate Last Admin   sodium chloride flush (NS) 0.9 % injection 10 mL  10 mL Intracatheter PRN Mosher, Vida Roller A, PA-C   10 mL at 02/19/21 1004   Past Medical History:  Diagnosis Date   Acute postoperative respiratory insufficiency 04/17/2020   AKI (acute kidney injury) ([place]) 04/17/2020   Anemia    Anemia, unspecified    Anemia, unspecified    Anemia, unspecified    Anxiety    Arthritis    BRCA gene mutation positive    Chronic pain syndrome    Depression    Diabetes mellitus without complication ([place])    type 2   Diabetic ulcer of toe of left foot associated with  type 2 diabetes mellitus, with fat layer exposed ([place]) 03/27/2020   Gastritis    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 No longer has   Past Surgical History:  Procedure Laterality Date   APPENDECTOMY     BACK SURGERY     x 3 lower disc   BILATERAL TOTAL MASTECTOMY WITH AXILLARY LYMPH NODE DISSECTION Bilateral 09/2019   CHOLECYSTECTOMY     nephrolithiasis     ROBOTIC ASSISTED TOTAL HYSTERECTOMY WITH BILATERAL SALPINGO OOPHERECTOMY Bilateral 06/14/2016   Procedure: ROBOTIC ASSISTED TOTAL HYSTERECTOMY WITH BILATERAL SALPINGO OOPHORECTOMY;  Surgeon: Nancy Marus, MD;  Location: [facility];  Service: Gynecology;  Laterality: Bilateral;    Family History  Problem Relation Age of Onset   Cancer Mother        breast   CAD Father    Diabetes Father    Heart failure Father    Cancer Father         renal carcinoma   Kidney failure Father    Cancer Brother 53       renal carcinoma.   Stroke Paternal Grandmother    Social History   Socioeconomic History   Marital status: Divorced    Spouse name: Not on file   Number of children: Not on file   Years of education: Not on file   Highest education level: Not on file  Occupational History   Occupation: disabled  Tobacco Use   Smoking status: Never   Smokeless tobacco: Never  Substance and Sexual Activity   Alcohol use: No   Drug use: No   Sexual activity: Yes  Other Topics Concern   Not on file  Social History Narrative   wears sunscreen, brushes and flosses daily, see's dentist bi-annually, has smoke/carbon monoxide detectors, wears a seatbelt and practices gun safety   Social Determinants of Health   Financial Resource Strain: High Risk (09/01/2021)   Overall Financial Resource Strain (CARDIA)    Difficulty of Paying Living Expenses: Hard  Food Insecurity: Food Insecurity Present (06/30/2021)   Hunger Vital Sign    Worried About Running Out of Food in the Last Year: Often true    Ran Out of Food in the Last Year: Often true  Transportation Needs: Unmet Transportation Needs (09/01/2021)   PRAPARE - Hydrologist (Medical): Yes    Lack of Transportation (Non-Medical): Yes  Physical Activity: Not on file  Stress: Stress Concern Present (07/01/2021)   [place]    Feeling of Stress : To some extent  Social Connections: Not on file    Review of Systems  Constitutional:  Positive for fatigue. Negative for chills and fever.  HENT:  Positive for sore throat and voice change. Negative for congestion, ear pain and sinus pressure.   Respiratory:  Positive for cough. Negative for shortness of breath.   Cardiovascular:  Negative for chest pain.  Gastrointestinal:  Positive  for diarrhea. Negative for abdominal pain, constipation, nausea and  vomiting.  Endocrine: Positive for polydipsia and polyuria. Negative for polyphagia.  Genitourinary:  Negative for dysuria and urgency.  Musculoskeletal:  Positive for arthralgias and back pain. Negative for myalgias.  Skin:  Negative for rash.  Neurological:  Negative for dizziness and headaches.  Psychiatric/Behavioral:  Negative for dysphoric mood, sleep disturbance and suicidal ideas. The patient is not nervous/anxious.      Objective:  BP 116/68   Pulse 100   Temp (!) 97.3 F (36.3 C)   Resp 18   Ht 5' 2"  (1.575 m)   Wt 209 lb (94.8 kg)   LMP 06/13/2009   BMI 38.23 kg/m      10/01/2021    7:33 AM 09/17/2021    9:38 AM 09/13/2021   11:20 AM  BP/Weight  Systolic BP 947 096 283  Diastolic BP 68 74 73  Wt. (Lbs) 209 206.4 208.4  BMI 38.23 kg/m2 37.75 kg/m2 38.12 kg/m2    Physical Exam Vitals reviewed.  Constitutional:      Appearance: Normal appearance. She is normal weight.  HENT:     Mouth/Throat:     Comments: hoarse Neck:     Vascular: No carotid bruit.  Cardiovascular:     Rate and Rhythm: Normal rate and regular rhythm.     Heart sounds: Normal heart sounds.  Pulmonary:     Effort: Pulmonary effort is normal. No respiratory distress.     Breath sounds: Normal breath sounds.  Abdominal:     General: Abdomen is flat. Bowel sounds are normal.     Palpations: Abdomen is soft.     Tenderness: There is no abdominal tenderness.  Musculoskeletal:        General: Tenderness (lumbar) present.     Comments: Right leg positive for SLR. Also 4/5 quads.  Left leg is normal   Neurological:     Mental Status: She is alert and oriented to person, place, and time.  Psychiatric:        Mood and Affect: Mood normal.        Behavior: Behavior normal.     Diabetic Foot Exam - Simple   Simple Foot Form Diabetic Foot exam was performed with the following findings: Yes 10/01/2021  8:58 AM  Visual Inspection See comments: Yes Sensation Testing See comments: Yes Pulse  Check Posterior Tibialis and Dorsalis pulse intact bilaterally: Yes Comments Decreased sensation over entire feet. Left great toe: erythema, mild heat. Nontender (no sensation in feet. )      Lab Results  Component Value Date   WBC 4.7 09/13/2021   HGB 12.2 09/13/2021   HCT 38 09/13/2021   PLT 91 (A) 09/13/2021   GLUCOSE 145 (H) 06/23/2021   CHOL 145 06/23/2021   TRIG 150 (H) 06/23/2021   HDL 50 06/23/2021   LDLCALC 69 06/23/2021   ALT 23 09/13/2021   AST 30 09/13/2021   NA 138 09/13/2021   K 4.0 09/13/2021   CL 103 09/13/2021   CREATININE 0.8 09/13/2021   BUN 12 09/13/2021   CO2 28 (A) 09/13/2021   TSH 1.700 06/23/2021   HGBA1C 7.6 (H) 06/23/2021   MICROALBUR 30 10/09/2019      Assessment & Plan:   Problem List Items Addressed This Visit       Respiratory   Acute bronchitis due to other specified organisms    Improving.         Digestive   Liver cirrhosis secondary to  NASH (nonalcoholic steatohepatitis) (HCC) (Chronic)    Check labs.       Relevant Orders   Comprehensive metabolic panel   CBC with Differential/Platelet     Endocrine   Dyslipidemia associated with type 2 diabetes mellitus ([place])    Control: poorly controlled. Recommend check sugars fasting daily. Recommend check feet daily. Recommend annual eye exams. Medicines: Trulicity 3 mg injection once weekly, Synjardy 12.5 mg/ 1000 mg 1 tablet twice daily. Increase Tresiba to 60 units daily. Continue to work on eating a healthy diet and exercise.  Labs drawn today.         Relevant Orders   Microalbumin / creatinine urine ratio   Hemoglobin A1c   Acquired hypothyroidism    Previously well controlled Continue Synthroid at current dose  Recheck TSH and adjust Synthroid as indicated          Nervous and Auditory   Back pain of lumbar region with sciatica    Order lumbar mri. Refer to neurosurgery.       Relevant Orders   MR Lumbar Spine Wo Contrast   Ambulatory referral to  Neurosurgery     Other   Mixed hyperlipidemia    Well controlled.  No changes to medicines. Continue Atorvastatin 10 mg 1 tablet daily, lovaza 1 gm 2 capsules twice daily.  Continue to work on eating a healthy diet and exercise.  Labs drawn today.        Relevant Orders   Lipid panel   Vitamin D insufficiency    The current medical regimen is effective;  continue present plan and medications. Continue vitamin D 50K weekly.       Relevant Orders   VITAMIN D 25 Hydroxy (Vit-D Deficiency, Fractures)   Class 2 severe obesity due to excess calories with serious comorbidity and body mass index (BMI) of 38.0 to 38.9 in adult [facility])   Chronic pain syndrome    Secondary to back pain. Also having hip pain. xrays done at last visit were normal.  Recommended mri of lumbar spine. It was not released and patient did not call, so will reorder today and refer back to Dr. Ronnald Ramp.       Depression, major, recurrent, mild ([place])    The current medical regimen is effective;  continue present plan and medications. Continue Fetzima 80 mg daily, Bupropion 300 mg 1 tablet daily, Buspirone 5 mg take 1 tablet three times a day.      Uncomplicated opioid dependence ([place])    On morphine for chronic back pain.      Need for influenza vaccination   Relevant Orders   Flu Vaccine MDCK QUAD PF (Completed)   Erythema of foot    DDX: cellulitis vs gout. Gout more likely due to patient having been on augmentin until last night. If it was cellulits, this should have been a very effective treatment.       Relevant Orders   Uric acid  .  No orders of the defined types were placed in this encounter.   Orders Placed This Encounter  Procedures   MR Lumbar Spine Wo Contrast   HM MAMMOGRAPHY   Flu Vaccine MDCK QUAD PF   Microalbumin / creatinine urine ratio   Lipid panel   Hemoglobin A1c   Comprehensive metabolic panel   CBC with Differential/Platelet   VITAMIN D 25 Hydroxy (Vit-D Deficiency, Fractures)    Uric acid   Ambulatory referral to Neurosurgery     Follow-up: Return in about 3 months (  around 12/31/2021) for chronic fasting.  An After Visit Summary was printed and given to the patient.  [name], MD [facility] [phone]

## 2021-09-30 NOTE — Patient Instructions (Signed)
Visit Information  Thank you for taking time to visit with me today. Please don't hesitate to contact me if I can be of assistance to you.   Following are the goals we discussed today:   Goals Addressed             This Visit's Progress    Reduce depression/symptoms       Care Coordination Interventions:  Depression screen reviewed  Solution-Focused Strategies employed:  Active listening / Reflection utilized  Reviewed mental health medications and discussed importance of compliance:   Participation in counseling encouraged  Made referral to    [facility]         Our next appointment is by telephone on 10/04/21    Please call the care guide team at [phone] if you need to cancel or reschedule your appointment.   If you are experiencing a Mental Health or [place] or need someone to talk to, please call the [place] National Suicide Prevention Lifeline: 713-740-8985 or TTY: 321-554-0285 TTY (859)840-8401) to talk to a trained counselor call 911   Patient verbalizes understanding of instructions and care plan provided today and agrees to view in [facility]. Active MyChart status and patient understanding of how to access instructions and care plan via MyChart confirmed with patient.     Telephone follow up appointment with care management team member scheduled for:10/04/21  Eduard Clos MSW, LCSW Licensed Clinical Social Worker      [phone]

## 2021-10-01 ENCOUNTER — Encounter: Payer: Self-pay | Admitting: Family Medicine

## 2021-10-01 ENCOUNTER — Ambulatory Visit (INDEPENDENT_AMBULATORY_CARE_PROVIDER_SITE_OTHER): Payer: HMO | Admitting: Family Medicine

## 2021-10-01 DIAGNOSIS — E559 Vitamin D deficiency, unspecified: Secondary | ICD-10-CM | POA: Diagnosis not present

## 2021-10-01 DIAGNOSIS — K7460 Unspecified cirrhosis of liver: Secondary | ICD-10-CM

## 2021-10-01 DIAGNOSIS — M5440 Lumbago with sciatica, unspecified side: Secondary | ICD-10-CM | POA: Diagnosis not present

## 2021-10-01 DIAGNOSIS — F330 Major depressive disorder, recurrent, mild: Secondary | ICD-10-CM

## 2021-10-01 DIAGNOSIS — E785 Hyperlipidemia, unspecified: Secondary | ICD-10-CM

## 2021-10-01 DIAGNOSIS — E782 Mixed hyperlipidemia: Secondary | ICD-10-CM | POA: Diagnosis not present

## 2021-10-01 DIAGNOSIS — Z23 Encounter for immunization: Secondary | ICD-10-CM | POA: Diagnosis not present

## 2021-10-01 DIAGNOSIS — G894 Chronic pain syndrome: Secondary | ICD-10-CM | POA: Diagnosis not present

## 2021-10-01 DIAGNOSIS — L539 Erythematous condition, unspecified: Secondary | ICD-10-CM | POA: Diagnosis not present

## 2021-10-01 DIAGNOSIS — F1120 Opioid dependence, uncomplicated: Secondary | ICD-10-CM

## 2021-10-01 DIAGNOSIS — Z6838 Body mass index (BMI) 38.0-38.9, adult: Secondary | ICD-10-CM

## 2021-10-01 DIAGNOSIS — K7581 Nonalcoholic steatohepatitis (NASH): Secondary | ICD-10-CM

## 2021-10-01 DIAGNOSIS — E1169 Type 2 diabetes mellitus with other specified complication: Secondary | ICD-10-CM

## 2021-10-01 DIAGNOSIS — J208 Acute bronchitis due to other specified organisms: Secondary | ICD-10-CM

## 2021-10-01 DIAGNOSIS — E039 Hypothyroidism, unspecified: Secondary | ICD-10-CM

## 2021-10-01 NOTE — Assessment & Plan Note (Signed)
The current medical regimen is effective;  continue present plan and medications. Continue Fetzima 80 mg daily, Bupropion 300 mg 1 tablet daily, Buspirone 5 mg take 1 tablet three times a day.

## 2021-10-01 NOTE — Assessment & Plan Note (Signed)
Previously well controlled Continue Synthroid at current dose  Recheck TSH and adjust Synthroid as indicated   

## 2021-10-01 NOTE — Assessment & Plan Note (Signed)
Control: poorly controlled. Recommend check sugars fasting daily. Recommend check feet daily. Recommend annual eye exams. Medicines: Trulicity 3 mg injection once weekly, Synjardy 12.5 mg/ 1000 mg 1 tablet twice daily. Increase Tresiba to 60 units daily. Continue to work on eating a healthy diet and exercise.  Labs drawn today.

## 2021-10-01 NOTE — Assessment & Plan Note (Signed)
Secondary to back pain. Also having hip pain. xrays done at last visit were normal.  Recommended mri of lumbar spine. It was not released and patient did not call, so will reorder today and refer back to Dr. Ronnald Ramp.

## 2021-10-01 NOTE — Assessment & Plan Note (Signed)
Improving.

## 2021-10-01 NOTE — Assessment & Plan Note (Signed)
DDX: cellulitis vs gout. Gout more likely due to patient having been on augmentin until last night. If it was cellulits, this should have been a very effective treatment.

## 2021-10-01 NOTE — Assessment & Plan Note (Signed)
Order lumbar mri. Refer to neurosurgery.

## 2021-10-01 NOTE — Assessment & Plan Note (Signed)
On morphine for chronic back pain.

## 2021-10-01 NOTE — Assessment & Plan Note (Signed)
>>  ASSESSMENT AND PLAN FOR MODERATELY SEVERE RECURRENT MAJOR DEPRESSION (HCC) WRITTEN ON 10/01/2021  7:31 PM BY [name], KIRSTEN, MD  The current medical regimen is effective;  continue present plan and medications. Continue Fetzima 80 mg daily, Bupropion 300 mg 1 tablet daily, Buspirone 5 mg take 1 tablet three times a day.

## 2021-10-01 NOTE — Assessment & Plan Note (Signed)
Well controlled.  No changes to medicines. Continue Atorvastatin 10 mg 1 tablet daily, lovaza 1 gm 2 capsules twice daily.  Continue to work on eating a healthy diet and exercise.  Labs drawn today.

## 2021-10-01 NOTE — Assessment & Plan Note (Signed)
Check labs 

## 2021-10-01 NOTE — Assessment & Plan Note (Signed)
The current medical regimen is effective;  continue present plan and medications. Continue vitamin D 50K weekly.

## 2021-10-01 NOTE — Patient Instructions (Signed)
Increase tresiba to 60 U daily. Continue other medicines.  Recommend continue to work on eating healthy diet and exercise.

## 2021-10-02 LAB — URIC ACID: Uric Acid: 4.6 mg/dL (ref 3.0–7.2)

## 2021-10-04 LAB — CBC WITH DIFFERENTIAL/PLATELET
Basophils Absolute: 0 10*3/uL (ref 0.0–0.2)
Basos: 1 %
EOS (ABSOLUTE): 0.1 10*3/uL (ref 0.0–0.4)
Eos: 2 %
Hematocrit: 39.5 % (ref 34.0–46.6)
Hemoglobin: 12.1 g/dL (ref 11.1–15.9)
Immature Grans (Abs): 0 10*3/uL (ref 0.0–0.1)
Immature Granulocytes: 0 %
Lymphocytes Absolute: 1.8 10*3/uL (ref 0.7–3.1)
Lymphs: 30 %
MCH: 26.2 pg — ABNORMAL LOW (ref 26.6–33.0)
MCHC: 30.6 g/dL — ABNORMAL LOW (ref 31.5–35.7)
MCV: 86 fL (ref 79–97)
Monocytes Absolute: 0.3 10*3/uL (ref 0.1–0.9)
Monocytes: 5 %
Neutrophils Absolute: 3.7 10*3/uL (ref 1.4–7.0)
Neutrophils: 62 %
Platelets: 82 10*3/uL — CL (ref 150–450)
RBC: 4.61 x10E6/uL (ref 3.77–5.28)
RDW: 15.1 % (ref 11.7–15.4)
WBC: 5.9 10*3/uL (ref 3.4–10.8)

## 2021-10-04 LAB — COMPREHENSIVE METABOLIC PANEL
ALT: 21 IU/L (ref 0–32)
AST: 19 IU/L (ref 0–40)
Albumin/Globulin Ratio: 1.7 (ref 1.2–2.2)
Albumin: 4.1 g/dL (ref 3.8–4.9)
Alkaline Phosphatase: 120 IU/L (ref 44–121)
BUN/Creatinine Ratio: 12 (ref 9–23)
BUN: 11 mg/dL (ref 6–24)
Bilirubin Total: 0.5 mg/dL (ref 0.0–1.2)
CO2: 21 mmol/L (ref 20–29)
Calcium: 9.5 mg/dL (ref 8.7–10.2)
Chloride: 102 mmol/L (ref 96–106)
Creatinine, Ser: 0.91 mg/dL (ref 0.57–1.00)
Globulin, Total: 2.4 g/dL (ref 1.5–4.5)
Glucose: 139 mg/dL — ABNORMAL HIGH (ref 70–99)
Potassium: 4.3 mmol/L (ref 3.5–5.2)
Sodium: 141 mmol/L (ref 134–144)
Total Protein: 6.5 g/dL (ref 6.0–8.5)
eGFR: 75 mL/min/{1.73_m2} (ref >59)

## 2021-10-04 LAB — MICROALBUMIN / CREATININE URINE RATIO
Creatinine, Urine: 91.8 mg/dL
Microalb/Creat Ratio: 3 mg/g creat (ref 0–29)
Microalbumin, Urine: 3.1 ug/mL

## 2021-10-04 LAB — LIPID PANEL
Chol/HDL Ratio: 3.2 ratio (ref 0.0–4.4)
Cholesterol, Total: 155 mg/dL (ref 100–199)
HDL: 48 mg/dL (ref >39)
LDL Chol Calc (NIH): 80 mg/dL (ref 0–99)
Triglycerides: 159 mg/dL — ABNORMAL HIGH (ref 0–149)
VLDL Cholesterol Cal: 27 mg/dL (ref 5–40)

## 2021-10-04 LAB — HEMOGLOBIN A1C
Est. average glucose Bld gHb Est-mCnc: 229 mg/dL
Hgb A1c MFr Bld: 9.6 % — ABNORMAL HIGH (ref 4.8–5.6)

## 2021-10-04 LAB — VITAMIN D 25 HYDROXY (VIT D DEFICIENCY, FRACTURES): Vit D, 25-Hydroxy: 43 ng/mL (ref 30.0–100.0)

## 2021-10-04 LAB — CARDIOVASCULAR RISK ASSESSMENT

## 2021-10-04 NOTE — Progress Notes (Signed)
Blood count abnormal. Platelets low but fairly stable.  Liver function normal.  Kidney function normal.  Cholesterol: LDL up a little. Goal less than 70. Trigs up a little. HBA1C: 9.6. much worse from 7.6. Increase trulicity to 4.5 mg weekly. Increase tresiba to 60 U before bed.  Uric acid normal.  Vitamin D normal.  Not spilling protein in urine.

## 2021-10-07 ENCOUNTER — Inpatient Hospital Stay: Payer: HMO | Attending: Hematology and Oncology

## 2021-10-07 VITALS — BP 155/74 | HR 85 | Temp 98.5°F | Resp 18 | Wt 210.0 lb

## 2021-10-07 DIAGNOSIS — M4807 Spinal stenosis, lumbosacral region: Secondary | ICD-10-CM | POA: Diagnosis not present

## 2021-10-07 DIAGNOSIS — Z452 Encounter for adjustment and management of vascular access device: Secondary | ICD-10-CM | POA: Insufficient documentation

## 2021-10-07 DIAGNOSIS — M5126 Other intervertebral disc displacement, lumbar region: Secondary | ICD-10-CM | POA: Diagnosis not present

## 2021-10-07 DIAGNOSIS — C50311 Malignant neoplasm of lower-inner quadrant of right female breast: Secondary | ICD-10-CM | POA: Diagnosis not present

## 2021-10-07 DIAGNOSIS — M5440 Lumbago with sciatica, unspecified side: Secondary | ICD-10-CM | POA: Diagnosis not present

## 2021-10-07 MED ORDER — SODIUM CHLORIDE 0.9% FLUSH: 10.0000 mL | INTRAVENOUS | Status: DC | PRN

## 2021-10-07 MED ORDER — HEPARIN SOD (PORK) LOCK FLUSH 100 UNIT/ML IV SOLN: 500.0000 [IU] | Freq: Once | INTRAVENOUS | Status: DC | PRN

## 2021-10-07 NOTE — Patient Instructions (Signed)
Implanted [facility] Guide An implanted port is a device that is placed under the skin. It is usually placed in the chest. The device may vary based on the need. Implanted ports can be used to give IV medicine, to take blood, or to give fluids. You may have an implanted port if: You need IV medicine that would be irritating to the small veins in your hands or arms. You need IV medicines, such as chemotherapy, for a long period of time. You need IV nutrition for a long period of time. You may have fewer limitations when using a port than you would if you used other types of long-term IVs. You will also likely be able to return to normal activities after your incision heals. An implanted port has two main parts: Reservoir. The reservoir is the part where a needle is inserted to give medicines or draw blood. The reservoir is round. After the port is placed, it appears as a small, raised area under your skin. Catheter. The catheter is a small, thin tube that connects the reservoir to a vein. Medicine that is inserted into the reservoir goes into the catheter and then into the vein. How is my port accessed? To access your port: A numbing cream may be placed on the skin over the port site. Your health care provider will put on a mask and sterile gloves. The skin over your port will be cleaned carefully with a germ-killing soap and allowed to dry. Your health care provider will gently pinch the port and insert a needle into it. Your health care provider will check for a blood return to make sure the port is in the vein and is still working (patent). If your port needs to remain accessed to get medicine continuously (constant infusion), your health care provider will place a clear bandage (dressing) over the needle site. The dressing and needle will need to be changed every week, or as told by your health care provider. What is flushing? Flushing helps keep the port working. Follow instructions from your  health care provider about how and when to flush the port. Ports are usually flushed with saline solution or a medicine called heparin. The need for flushing will depend on how the port is used: If the port is only used from time to time to give medicines or draw blood, the port may need to be flushed: Before and after medicines have been given. Before and after blood has been drawn. As part of routine maintenance. Flushing may be recommended every 4-6 weeks. If a constant infusion is running, the port may not need to be flushed. Throw away any syringes in a disposal container that is meant for sharp items (sharps container). You can buy a sharps container from a pharmacy, or you can make one by using an empty hard plastic bottle with a cover. How long will my port stay implanted? The port can stay in for as long as your health care provider thinks it is needed. When it is time for the port to come out, a surgery will be done to remove it. The surgery will be similar to the procedure that was done to put the port in. Follow these instructions at home: Caring for your port and port site Flush your port as told by your health care provider. If you need an infusion over several days, follow instructions from your health care provider about how to take care of your port site. Make sure you: Change your  dressing as told by your health care provider. Wash your hands with soap and water for at least 20 seconds before and after you change your dressing. If soap and water are not available, use alcohol-based hand sanitizer. Place any used dressings or infusion bags into a plastic bag. Throw that bag in the trash. Keep the dressing that covers the needle clean and dry. Do not get it wet. Do not use scissors or sharp objects near the infusion tubing. Keep any external tubes clamped, unless they are being used. Check your port site every day for signs of infection. Check for: Redness, swelling, or  pain. Fluid or blood. Warmth. Pus or a bad smell. Protect the skin around the port site. Avoid wearing bra straps that rub or irritate the site. Protect the skin around your port from seat belts. Place a soft pad over your chest if needed. Bathe or shower as told by your health care provider. The site may get wet as long as you are not actively receiving an infusion. General instructions  Return to your normal activities as told by your health care provider. Ask your health care provider what activities are safe for you. Carry a medical alert card or wear a medical alert bracelet at all times. This will let health care providers know that you have an implanted port in case of an emergency. Where to find more information American Cancer Society: www.cancer.[place] of Clinical Oncology: www.cancer.net Contact a health care provider if: You have a fever or chills. You have redness, swelling, or pain at the port site. You have fluid or blood coming from your port site. Your incision feels warm to the touch. You have pus or a bad smell coming from the port site. Summary Implanted ports are usually placed in the chest for long-term IV access. Follow instructions from your health care provider about flushing the port and changing bandages (dressings). Take care of the area around your port by avoiding clothing that puts pressure on the area, and by watching for signs of infection. Protect the skin around your port from seat belts. Place a soft pad over your chest if needed. Contact a health care provider if you have a fever or you have redness, swelling, pain, fluid, or a bad smell at the port site. This information is not intended to replace advice given to you by your health care provider. Make sure you discuss any questions you have with your health care provider. Document Revised: 07/14/2020 Document Reviewed: 07/14/2020 Elsevier Patient Education  [employer].

## 2021-10-10 ENCOUNTER — Encounter: Payer: Self-pay | Admitting: Hematology and Oncology

## 2021-10-14 ENCOUNTER — Telehealth: Payer: Self-pay

## 2021-10-14 NOTE — Progress Notes (Signed)
Chronic Care Management Pharmacy Assistant   Name: Linda [name]  MRN: [id number] DOB: 01/05/1971   Reason for Encounter: Medication Coordination for [facility]    Recent office visits:  10/01/21 [name] MD. Seen for DM. Referral to Neurosurgery. Increase tresiba to 60 U daily.D/C Prochlorperazine Maleate 13m.   09/17/21 [name], Kirsten MD. Seen for cough. Continue to increase insulin if sugars are up on prednisone  Recent consult visits:  None  Hospital visits:  None  Medications: Outpatient Encounter Medications as of 10/14/2021  Medication Sig Note   albuterol (VENTOLIN HFA) 108 (90 Base) MCG/ACT inhaler Inhale 2 puffs into the lungs every 6 (six) hours as needed for wheezing or shortness of breath.    atorvastatin (LIPITOR) 10 MG tablet Take 1 tablet (10 mg total) by mouth daily.    benzonatate (TESSALON) 100 MG capsule Take 1 capsule (100 mg total) by mouth 2 (two) times daily as needed for cough.    Blood Glucose Monitoring Suppl (ONETOUCH VERIO REFLECT) w/Device KIT AS DIRECTED    buPROPion (WELLBUTRIN XL) 300 MG 24 hr tablet Take 1 tablet (300 mg total) by mouth every evening.    busPIRone (BUSPAR) 5 MG tablet Take 1 tablet (5 mg total) by mouth 3 (three) times daily.    calcium citrate-vitamin D (CITRACAL+D) 315-200 MG-UNIT tablet Take 1 tablet by mouth 2 (two) times daily.    clotrimazole-betamethasone (LOTRISONE) cream Apply twice daily as needed to area of concern    Continuous Blood Gluc Receiver (FREESTYLE LIBRE 2 READER) DEVI E11.69 Check blood sugar 4 times daily as directed    Continuous Blood Gluc Sensor (FREESTYLE LIBRE 2 SENSOR) MISC E11.69 Change sensor every 14 days as directed    cyclobenzaprine (FLEXERIL) 10 MG tablet TAKE ONE TABLET BY MOUTH every EIGHT hours AS NEEDED FOR muscle SPASMS    dicyclomine (BENTYL) 20 MG tablet TAKE ONE TABLET BY MOUTH BEFORE MEALS AND AT BEDTIME AS NEEDED FOR STOMACH CRAMPING    Dulaglutide (TRULICITY) 4.5 MQD/8.2MESOPN  Inject 4.5 mg as directed once a week.    Empagliflozin-metFORMIN HCl (SYNJARDY) 12.05-998 MG TABS Take 1 tablet by mouth 2 (two) times daily.    famotidine (PEPCID) 20 MG tablet Take 1 tablet (20 mg total) by mouth 2 (two) times daily.    ferrous sulfate 325 (65 FE) MG tablet Take 325 mg by mouth every evening.    gabapentin (NEURONTIN) 300 MG capsule Take 2 capsules (600 mg total) by mouth 3 (three) times daily.    glucose blood (ONETOUCH VERIO) test strip Use as instructed    insulin degludec (TRESIBA FLEXTOUCH) 200 UNIT/ML FlexTouch Pen Inject 52 Units into the skin daily.    Levomilnacipran HCl ER (FETZIMA) 80 MG CP24 Take 1 capsule by mouth daily.    levothyroxine (SYNTHROID) 75 MCG tablet Take 1 tablet (75 mcg total) by mouth daily.    lisdexamfetamine (VYVANSE) 70 MG capsule Take 1 capsule (70 mg total) by mouth daily. (Patient not taking: Reported on 09/30/2021)    losartan (COZAAR) 100 MG tablet Take 1 tablet (100 mg total) by mouth daily.    Magnesium 500 MG CAPS 500 mg.    morphine (MS CONTIN) 30 MG 12 hr tablet Take 1 tablet (30 mg total) by mouth every 12 (twelve) hours.    Multiple Vitamin (MULTIVITAMIN WITH MINERALS) TABS tablet Take 1 tablet by mouth daily.  06/08/2018: Pt plans on purchasing again when she can get out.   omega-3 acid ethyl esters (LOVAZA)  1 g capsule Take 2 capsules (2 g total) by mouth 2 (two) times daily.    ondansetron (ZOFRAN) 4 MG tablet Take 1 tablet (4 mg total) by mouth every 4 (four) hours as needed for nausea.    OneTouch Delica Lancets 41O MISC 1 each by Does not apply route daily before breakfast. Check blood sugar twice daily.    pantoprazole (PROTONIX) 40 MG tablet Take 1 tablet (40 mg total) by mouth 2 (two) times daily. (Patient not taking: Reported on 09/30/2021)    potassium chloride (MICRO-K) 10 MEQ CR capsule Take 2 capsules (20 mEq total) by mouth 2 (two) times daily.    rOPINIRole (REQUIP) 0.25 MG tablet Take 1 tablet (0.25 mg total) by mouth at  bedtime.    Vitamin D, Ergocalciferol, (DRISDOL) 1.25 MG (50000 UNIT) CAPS capsule Take one capsule by mouth weekly    zolpidem (AMBIEN) 10 MG tablet One before bed    Facility-Administered Encounter Medications as of 10/14/2021  Medication   sodium chloride flush (NS) 0.9 % injection 10 mL    Reviewed chart for medication changes ahead of medication coordination call.  No Consults, or hospital visits since last care coordination call/Pharmacist visit.   BP Readings from Last 3 Encounters:  10/07/21 (!) 155/74  10/01/21 116/68  09/17/21 124/74    Lab Results  Component Value Date   HGBA1C 9.6 (H) 10/01/2021     Patient obtains medications through Adherence Packaging  30 Days   Last adherence delivery included:  Tresiba Flex Inj 200u- inject 52 units daily      Atorvastatin 73m 1 EM Synjardy 12.5-1000mg 1 B 1 EM Famotidine 225m1 B 1 EM Lostartan Pot 10040m EM Bupropion HCI XL 300m16mB Levothyroxine 75mc68mBB Zolpidem 10mg 22m Potassium Chloride ER 10MEQ 2 B 2 EM Fetzima ER 80mg  79m Morphine Sulfate ER 30 mg 1 tab every 12 hours (Bottle)       Buspirone 5mg 1 B25mL1 EM Vyvanse 70mg 1 B37mpinirole 0.25mg 1 BT101maza 1gm 2 B 2 EM Gabapentin 300mg 2 cap23ms three times daily (Bottle)      Trulicity 4.5 inject 4.5mg once 8.7OMy             Vitamin D2 1250mcg 1 B o59midays Freestyle Libre 2 sensors change every 14 days       Prochlorperazine 10mg 1 tab e49m 6 hours as needed (Bottle)        Cyclobenzaprine 10mg 1 tab ev84m8 hours as needed (Bottle) Onetouch Delica Lancets 30g Onetouch V76Ho Test Strips   Patient declined (meds) last month  Pantoprazole 40mg- has enou41mn hand, uses prn  Ondansetron 4mg- has enough32m hand, uses prn   Patient is due for next adherence delivery on: 10/26/21. Called patient and reviewed medications and coordinated delivery.  This delivery to include: Tresiba Flex Inj 200u- inject 52 units daily      Atorvastatin 10mg 1  EM Synja17m12.5-1000mg 1 B 1 EM Famotidine 20mg 1 B 1 EM Los7man Pot 100mg 1 EM Bupropio59mI XL 300mg 1 B Levothyrox33m75mcg 1 BB Pantopraz55m40mg 1 B 1 EM Zolpide46mmg 1 BT Potassium Ch54mde ER 10MEQ 2 B 2 EM Fetzima ER 80mg  1 EM Morphine Sul71m ER 30 mg 1 tab every 12 hours (Bottle)       Buspirone 5mg 1 B 1 L1 EM Vyvanse 6mg 1 B  Ropinirole 0.2553m BT Lovaza 1gm40m  2 B 2 EM Gabapentin 360m 2 capsules three times daily (Bottle)      Trulicity 4.5 inject 41.6XIonce weekly             Vitamin D2 12539m 1 B on Fridays Freestyle Libre 2 sensors change every 14 days       Prochlorperazine 1081m tab every 6 hours as needed (Bottle)        Cyclobenzaprine 86m53mtab every 8 hours as needed (Bottle) Onetouch Delica Lancets 30g 50Ttouch Verio Test Strips   Patient declined the following medications  Albuterol Inhaler- Does not need, prn  Ondansetron 4mg-64ms enough on hand, uses prn   Patient needs refills -Request Sent  Famotidine 20mg 37mopion HCI XL 300mg L22mhyroxine 75mcg P79msium Chloride ER 10MEQ  Morphine Sulfate ER 30 mg   Confirmed delivery date of 10/26/21, advised patient that pharmacy will contact them the morning of delivery.   DanielleElray McgregorinGraniteist Assistant  [phone]

## 2021-10-15 ENCOUNTER — Telehealth: Payer: Self-pay | Admitting: *Deleted

## 2021-10-15 NOTE — Patient Outreach (Signed)
  Care Coordination   Follow Up Visit Note   10/15/2021 Name: Linda [name] MRN: [id number] DOB: [birth year]  Linda [name] is a 51 y.o. year old female who sees [name], Elnita Maxwell, MD for primary care. I spoke with  Linda [name] by phone today.  What matters to the patients health and wellness today?  Reduce depression symptoms through support, counseling,etc   Goals Addressed             This Visit's Progress    Reduce depression/symptoms       Care Coordination Interventions:  Depression screen reviewed  Solution-Focused Strategies employed:  Active listening / Reflection utilized  Reviewed mental health medications and discussed importance of compliance:   Participation in counseling encouraged  Made referral to    [facility] - Pt is unsure if they have called her- she has been away due to the death of a cousin and reports she may have to have back surgery- suggested she call them to inquire        SDOH assessments and interventions completed:  Yes     Care Coordination Interventions Activated:  Yes  Care Coordination Interventions:  Yes, provided   Follow up plan: Follow up call scheduled for 10/28/21    Encounter Outcome:  Pt. Visit Completed

## 2021-10-15 NOTE — Patient Instructions (Signed)
Visit Information  Thank you for taking time to visit with me today. Please don't hesitate to contact me if I can be of assistance to you.   Following are the goals we discussed today:   Goals Addressed             This Visit's Progress    Reduce depression/symptoms       Care Coordination Interventions:  Depression screen reviewed  Solution-Focused Strategies employed:  Active listening / Reflection utilized  Reviewed mental health medications and discussed importance of compliance:   Participation in counseling encouraged  Made referral to    [facility] - Pt is unsure if they have called her- she has been away due to the death of a cousin and reports she may have to have back surgery- suggested she call them to inquire        Our next appointment is by telephone on 10/28/21    Please call the care guide team at [phone] if you need to cancel or reschedule your appointment.   If you are experiencing a Mental Health or [place] or need someone to talk to, please call the Suicide and Crisis Lifeline: 988 call 911   Patient verbalizes understanding of instructions and care plan provided today and agrees to view in [place]. Active MyChart status and patient understanding of how to access instructions and care plan via MyChart confirmed with patient.     Telephone follow up appointment with care management team member scheduled for:10/28/21  Eduard Clos MSW, LCSW Licensed Clinical Social Worker      [phone]

## 2021-10-18 ENCOUNTER — Other Ambulatory Visit: Payer: Self-pay

## 2021-10-18 DIAGNOSIS — Z1501 Genetic susceptibility to malignant neoplasm of breast: Secondary | ICD-10-CM

## 2021-10-18 MED ORDER — MORPHINE SULFATE ER 30 MG PO TBCR: 30.0000 mg | EXTENDED_RELEASE_TABLET | Freq: Two times a day (BID) | ORAL | 0 refills | Status: DC

## 2021-10-18 MED ORDER — BUPROPION HCL ER (XL) 300 MG PO TB24: 300.0000 mg | ORAL_TABLET | Freq: Every evening | ORAL | 1 refills | Status: DC

## 2021-10-18 MED ORDER — POTASSIUM CHLORIDE ER 10 MEQ PO CPCR: 20.0000 meq | ORAL_CAPSULE | Freq: Two times a day (BID) | ORAL | 0 refills | Status: DC

## 2021-10-18 MED ORDER — LEVOTHYROXINE SODIUM 75 MCG PO TABS: 75.0000 ug | ORAL_TABLET | Freq: Every day | ORAL | 1 refills | Status: DC

## 2021-10-18 MED ORDER — FAMOTIDINE 20 MG PO TABS: 20.0000 mg | ORAL_TABLET | Freq: Two times a day (BID) | ORAL | 1 refills | Status: DC

## 2021-10-18 NOTE — Telephone Encounter (Signed)
10/18/21- Called and lvm to return my call.   Elray Mcgregor, [place] Pharmacist Assistant  [phone]

## 2021-10-25 ENCOUNTER — Encounter: Payer: Self-pay | Admitting: Hematology and Oncology

## 2021-10-25 ENCOUNTER — Telehealth: Payer: Self-pay | Admitting: Podiatry

## 2021-10-25 DIAGNOSIS — L03032 Cellulitis of left toe: Secondary | ICD-10-CM | POA: Diagnosis not present

## 2021-10-25 NOTE — Telephone Encounter (Signed)
Patient was not transferred to myself nor [place] but I called the patient back. I  advised her to go to the nearest Urgent care due to the blister's on her toes and her being a diabetic was a risk of waiting to see if the blisters would rupture or not especially by her toes being red.

## 2021-10-25 NOTE — Telephone Encounter (Signed)
Pt called stating she has several toes with sores on them. She is an Technical sales engineer pt. I offered appt 10.10 in [place] in the afternoon but she has appt with another doctor. I did schedule her for 10.16 with Dr Adolm Joseph in [place]. I confirmed she is diabetic and stated it was red and painful spot on 3rd toe. She asked what she should do if it busted open. And I transferred to the nurse.

## 2021-10-26 NOTE — Telephone Encounter (Signed)
10/26/21- 2nd attempt lvm to return my call    Elray Mcgregor, [place] Pharmacist Assistant  [phone]

## 2021-10-27 ENCOUNTER — Encounter: Payer: Self-pay | Admitting: Family Medicine

## 2021-10-28 ENCOUNTER — Ambulatory Visit: Payer: Self-pay | Admitting: *Deleted

## 2021-10-28 ENCOUNTER — Encounter: Payer: Self-pay | Admitting: Family Medicine

## 2021-10-28 ENCOUNTER — Ambulatory Visit (INDEPENDENT_AMBULATORY_CARE_PROVIDER_SITE_OTHER): Payer: HMO | Admitting: Family Medicine

## 2021-10-28 VITALS — BP 120/80 | HR 100 | Temp 97.2°F | Resp 14 | Ht 62.0 in | Wt 211.0 lb

## 2021-10-28 DIAGNOSIS — E11628 Type 2 diabetes mellitus with other skin complications: Secondary | ICD-10-CM | POA: Insufficient documentation

## 2021-10-28 DIAGNOSIS — L03119 Cellulitis of unspecified part of limb: Secondary | ICD-10-CM | POA: Insufficient documentation

## 2021-10-28 NOTE — Patient Instructions (Signed)
Visit Information  Thank you for taking time to visit with me today. Please don't hesitate to contact me if I can be of assistance to you.   Following are the goals we discussed today:   Goals Addressed             This Visit's Progress    COMPLETED: Reduce depression/symptoms       Care Coordination Interventions:  Depression screen reviewed  Solution-Focused Strategies employed:  Active listening / Reflection utilized  Reviewed mental health medications and discussed importance of compliance:   Participation in counseling encouraged  Pt reports she has spoken with rep at [facility] who is following her through [employer] plan. CSW encouraged pt to stay connected with them for support, behavioral health. resources, advocacy, etc.  Pt is seeing PCP today due to toe wound and needing consult for specialist. Also plans to see back surgeon soon. Pt advised of plans for this CSW to sign off-         If you are experiencing a Mental Health or [place] or need someone to talk to, please call the [place] National Suicide Prevention Lifeline: (605)171-6160 or TTY: 940-436-6186 TTY 7315104989) to talk to a trained counselor call 1-800-273-TALK (toll free, 24 hour hotline) call 911   Patient verbalizes understanding of instructions and care plan provided today and agrees to view in [place]. Active MyChart status and patient understanding of how to access instructions and care plan via MyChart confirmed with patient.     No further follow up required: Pt is connected with [place] MSW, LCSW Licensed Clinical Social Worker      [phone]

## 2021-10-28 NOTE — Patient Outreach (Signed)
  Care Coordination   Follow Up Visit Note   10/28/2021 Name: Linda [name] MRN: [id number] DOB: [birth year]  Linda [name] is a 51 y.o. year old female who sees [name], Linda Maxwell, MD for primary care. I spoke with  Linda [name] by phone today.  What matters to the patients health and wellness today?  Seeing PCP today for toe ulcer/wound and needing referral to specialist.    Goals Addressed             This Visit's Progress    COMPLETED: Reduce depression/symptoms       Care Coordination Interventions:  Depression screen reviewed  Solution-Focused Strategies employed:  Active listening / Reflection utilized  Reviewed mental health medications and discussed importance of compliance:   Participation in counseling encouraged  Pt reports she has spoken with rep at [facility] who is following her through [employer] plan. CSW encouraged pt to stay connected with them for support, behavioral health. resources, advocacy, etc.  Pt is seeing PCP today due to toe wound and needing consult for specialist. Also plans to see back surgeon soon. Pt advised of plans for this CSW to sign off-        SDOH assessments and interventions completed:  Yes     Care Coordination Interventions Activated:  Yes  Care Coordination Interventions:  Yes, provided   Follow up plan: No further intervention required.   Encounter Outcome:  Pt. Visit Completed

## 2021-10-28 NOTE — Progress Notes (Signed)
Acute Office Visit  Subjective:    Patient ID: Linda [name], female    DOB: 10-18-1970, 51 y.o.   MRN: [id number]  Chief Complaint  Patient presents with   Toe Pain   Nail Problem    HPI: Patient is in today for sharp pain on 3rd toe on left foot and redness on the other toes since one week. She went to [facility] on 10/25/2021 and they gave her Augmentin 875 BID, but she did not see any difference.  Patient is requesting a referral to podiatrist.  Previously saw Dr. Cannon Kettle.  Patient has had an ulcer and she started using a honey dressing on her toes.  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 No longer has    Past Surgical History:  Procedure Laterality Date   APPENDECTOMY     BACK SURGERY     x 3 lower disc   BILATERAL TOTAL MASTECTOMY WITH AXILLARY LYMPH NODE DISSECTION Bilateral 09/2019   CHOLECYSTECTOMY     nephrolithiasis     ROBOTIC ASSISTED TOTAL HYSTERECTOMY WITH BILATERAL SALPINGO  OOPHERECTOMY Bilateral 06/14/2016   Procedure: ROBOTIC ASSISTED TOTAL HYSTERECTOMY WITH BILATERAL SALPINGO OOPHORECTOMY;  Surgeon: Nancy Marus, MD;  Location: [facility];  Service: Gynecology;  Laterality: Bilateral;    Family History  Problem Relation Age of Onset   Cancer Mother        breast   CAD Father    Diabetes Father    Heart failure Father    Cancer Father        renal carcinoma   Kidney failure Father    Cancer Brother 94       renal carcinoma.   Stroke Paternal Grandmother     Social History   Socioeconomic History   Marital status: Divorced    Spouse name: Not on file   Number of children: Not on file   Years of education: Not on file   Highest education level: Not on file  Occupational History   Occupation: disabled  Tobacco Use   Smoking status: Never   Smokeless tobacco: Never  Substance and Sexual Activity   Alcohol use: No   Drug use: No   Sexual activity: Yes  Other Topics Concern   Not on file  Social History Narrative   wears sunscreen, brushes and flosses daily, see's dentist bi-annually,  has smoke/carbon monoxide detectors, wears a seatbelt and practices gun safety   Social Determinants of Health   Financial Resource Strain: High Risk (09/01/2021)   Overall Financial Resource Strain (CARDIA)    Difficulty of Paying Living Expenses: Hard  Food Insecurity: Food Insecurity Present (06/30/2021)   Hunger Vital Sign    Worried About Running Out of Food in the Last Year: Often true    Ran Out of Food in the Last Year: Often true  Transportation Needs: Unmet Transportation Needs (09/01/2021)   PRAPARE - Hydrologist (Medical): Yes    Lack of Transportation (Non-Medical): Yes  Physical Activity: Not on file  Stress: Stress Concern Present (07/01/2021)   [place]    Feeling of Stress : To some extent  Social Connections: Not on file  Intimate Partner Violence:  Not on file    Outpatient Medications Prior to Visit  Medication Sig Dispense Refill   albuterol (VENTOLIN HFA) 108 (90 Base) MCG/ACT inhaler Inhale 2 puffs into the lungs every 6 (six) hours as needed for wheezing or shortness of breath. 8 g 1   amoxicillin-clavulanate (AUGMENTIN) 875-125 MG tablet Take by mouth.     atorvastatin (LIPITOR) 10 MG tablet Take 1 tablet (10 mg total) by mouth daily. 90 tablet 0   Blood Glucose Monitoring Suppl (ONETOUCH VERIO REFLECT) w/Device KIT AS DIRECTED     buPROPion (WELLBUTRIN XL) 300 MG 24 hr tablet Take 1 tablet (300 mg total) by mouth every evening. 90 tablet 1   busPIRone (BUSPAR) 5 MG tablet Take 1 tablet (5 mg total) by mouth 3 (three) times daily. 90 tablet 2   calcium citrate-vitamin D (CITRACAL+D) 315-200 MG-UNIT tablet Take 1 tablet by mouth 2 (two) times daily.     clotrimazole-betamethasone (LOTRISONE) cream Apply twice daily as needed to area of concern 45 g 2   Continuous Blood Gluc Receiver (FREESTYLE LIBRE 2 READER) DEVI E11.69 Check blood sugar 4 times daily as directed 1 each 0   Continuous Blood Gluc Sensor (FREESTYLE LIBRE 2 SENSOR) MISC E11.69 Change sensor every 14 days as directed 6 each 3   cyclobenzaprine (FLEXERIL) 10 MG tablet TAKE ONE TABLET BY MOUTH every EIGHT hours AS NEEDED FOR muscle SPASMS 30 tablet 3   dicyclomine (BENTYL) 20 MG tablet TAKE ONE TABLET BY MOUTH BEFORE MEALS AND AT BEDTIME AS NEEDED FOR STOMACH CRAMPING 180 tablet 1   Dulaglutide (TRULICITY) 4.5 OX/7.3ZH SOPN Inject 4.5 mg as directed once a week. 6 mL 0   Empagliflozin-metFORMIN HCl (SYNJARDY) 12.05-998 MG TABS Take 1 tablet by mouth 2 (two) times daily. 180 tablet 0   famotidine (PEPCID) 20 MG tablet Take 1 tablet (20 mg total) by mouth 2 (two) times daily. 180 tablet 1   ferrous sulfate 325 (65 FE) MG tablet Take 325 mg by mouth every evening.     gabapentin (NEURONTIN) 300 MG capsule Take 2 capsules (600 mg total) by mouth 3 (three) times daily. 180  capsule 1   glucose blood (ONETOUCH VERIO) test strip Use as instructed 100 each 12   insulin degludec (TRESIBA FLEXTOUCH) 200 UNIT/ML FlexTouch Pen Inject 52 Units into the skin daily. 9 mL 3   Levomilnacipran HCl ER (FETZIMA) 80 MG CP24 Take 1 capsule by mouth daily. 90 capsule 1   levothyroxine (SYNTHROID) 75 MCG tablet Take 1 tablet (75 mcg total) by mouth daily. 90 tablet 1   lisdexamfetamine (VYVANSE) 70 MG capsule  Take 1 capsule (70 mg total) by mouth daily. 30 capsule 0   losartan (COZAAR) 100 MG tablet Take 1 tablet (100 mg total) by mouth daily. 90 tablet 0   Magnesium 500 MG CAPS 500 mg.     morphine (MS CONTIN) 30 MG 12 hr tablet Take 1 tablet (30 mg total) by mouth every 12 (twelve) hours. 60 tablet 0   Multiple Vitamin (MULTIVITAMIN WITH MINERALS) TABS tablet Take 1 tablet by mouth daily.      omega-3 acid ethyl esters (LOVAZA) 1 g capsule Take 2 capsules (2 g total) by mouth 2 (two) times daily. 360 capsule 1   ondansetron (ZOFRAN) 4 MG tablet Take 1 tablet (4 mg total) by mouth every 4 (four) hours as needed for nausea. 90 tablet 3   OneTouch Delica Lancets 07M MISC 1 each by Does not apply route daily before breakfast. Check blood sugar twice daily. 100 each 3   pantoprazole (PROTONIX) 40 MG tablet Take 1 tablet (40 mg total) by mouth 2 (two) times daily. 180 tablet 1   potassium chloride (MICRO-K) 10 MEQ CR capsule Take 2 capsules (20 mEq total) by mouth 2 (two) times daily. 360 capsule 0   rOPINIRole (REQUIP) 0.25 MG tablet Take 1 tablet (0.25 mg total) by mouth at bedtime. 90 tablet 1   Vitamin D, Ergocalciferol, (DRISDOL) 1.25 MG (50000 UNIT) CAPS capsule Take one capsule by mouth weekly 12 capsule 1   zolpidem (AMBIEN) 10 MG tablet One before bed 30 tablet 5   amoxicillin-clavulanate (AUGMENTIN) 875-125 MG tablet SMARTSIG:1 Tablet(s) By Mouth Morning-Night     benzonatate (TESSALON) 100 MG capsule Take 1 capsule (100 mg total) by mouth 2 (two) times daily as needed for cough.  30 capsule 0   methylPREDNISolone (MEDROL) 4 MG tablet Take by mouth.     Facility-Administered Medications Prior to Visit  Medication Dose Route Frequency Provider Last Rate Last Admin   sodium chloride flush (NS) 0.9 % injection 10 mL  10 mL Intracatheter PRN Mosher, Kelli A, PA-C   10 mL at 02/19/21 1004    Allergies  Allergen Reactions   Codeine Shortness Of Breath    Other reaction(s): SHOB   Augmentin [Amoxicillin-Pot Clavulanate] Diarrhea   Celecoxib Other (See Comments)    Unknown reaction Other reaction(s): Unknown   Ezetimibe     Other reaction(s): Unknown   Ezetimibe-Simvastatin Other (See Comments)    Unknown reaction   Propranolol     Other reaction(s): Unknown   Propranolol Hcl Other (See Comments)    Unknown reaction   Simvastatin     Other reaction(s): Unknown    Review of Systems  Constitutional:  Negative for chills, fatigue and fever.  HENT:  Negative for congestion, ear pain and sore throat.   Respiratory:  Negative for cough and shortness of breath.   Cardiovascular:  Negative for chest pain and palpitations.  Gastrointestinal:  Negative for abdominal pain, constipation, diarrhea, nausea and vomiting.  Endocrine: Negative for polydipsia, polyphagia and polyuria.  Genitourinary:  Negative for difficulty urinating and dysuria.  Musculoskeletal:  Negative for arthralgias, back pain and myalgias.  Skin:  Positive for color change and wound (3r toe on left foot). Negative for rash.  Neurological:  Negative for headaches.  Psychiatric/Behavioral:  Negative for dysphoric mood. The patient is not nervous/anxious.        Objective:    Physical Exam Vitals reviewed.  Constitutional:      Appearance: Normal appearance. She is obese.  Skin:  Findings: Erythema (First second and third digits on left foot.) present.     Comments:  Patient also has significant calluses on the distal portion of these toes.  This has been present for some time.  On her third  toe she has a scrape on the lateral side.  Scabbed over.  Patient has neuropathy so is difficult to assess for pain.  Neurological:     Mental Status: She is alert.     BP 120/80   Pulse 100   Temp (!) 97.2 F (36.2 C)   Resp 14   Ht 5' 2"  (1.575 m)   Wt 211 lb (95.7 kg)   LMP 06/13/2009   SpO2 97%   BMI 38.59 kg/m  Wt Readings from Last 3 Encounters:  10/28/21 211 lb (95.7 kg)  10/07/21 210 lb (95.3 kg)  10/01/21 209 lb (94.8 kg)    Health Maintenance Due  Topic Date Due   Zoster Vaccines- Shingrix (1 of 2) Never done   COVID-19 Vaccine (6 - Pfizer risk series) 01/11/2021    There are no preventive care reminders to display for this patient.   Lab Results  Component Value Date   TSH 1.700 06/23/2021   Lab Results  Component Value Date   WBC 5.9 10/01/2021   HGB 12.1 10/01/2021   HCT 39.5 10/01/2021   MCV 86 10/01/2021   PLT 82 (LL) 10/01/2021   Lab Results  Component Value Date   NA 141 10/01/2021   K 4.3 10/01/2021   CO2 21 10/01/2021   GLUCOSE 139 (H) 10/01/2021   BUN 11 10/01/2021   CREATININE 0.91 10/01/2021   BILITOT 0.5 10/01/2021   ALKPHOS 120 10/01/2021   AST 19 10/01/2021   ALT 21 10/01/2021   PROT 6.5 10/01/2021   ALBUMIN 4.1 10/01/2021   CALCIUM 9.5 10/01/2021   ANIONGAP 9 11/27/2019   EGFR 75 10/01/2021   Lab Results  Component Value Date   CHOL 155 10/01/2021   Lab Results  Component Value Date   HDL 48 10/01/2021   Lab Results  Component Value Date   LDLCALC 80 10/01/2021   Lab Results  Component Value Date   TRIG 159 (H) 10/01/2021   Lab Results  Component Value Date   CHOLHDL 3.2 10/01/2021   Lab Results  Component Value Date   HGBA1C 9.6 (H) 10/01/2021       Assessment & Plan:   Problem List Items Addressed This Visit       Endocrine   Cellulitis in diabetic foot ([place]) - Primary    Spoke with [facility] for Dr. Cannon Kettle has transferred. She will be seen in the morning.      Follow-up: No  follow-ups on file.  An After Visit Summary was printed and given to the patient.  [name], MD [facility] [phone]

## 2021-10-28 NOTE — Telephone Encounter (Signed)
10/28/21- 3rd attempt and no answer. I will try and get sugars next monthly med call   Elray Mcgregor, [place] Pharmacist Assistant  [phone]

## 2021-10-28 NOTE — Assessment & Plan Note (Signed)
Spoke with [facility] for Dr. Cannon Kettle has transferred. She will be seen in the morning.

## 2021-10-29 DIAGNOSIS — E119 Type 2 diabetes mellitus without complications: Secondary | ICD-10-CM | POA: Diagnosis not present

## 2021-10-29 DIAGNOSIS — L97521 Non-pressure chronic ulcer of other part of left foot limited to breakdown of skin: Secondary | ICD-10-CM | POA: Diagnosis not present

## 2021-10-29 DIAGNOSIS — L03039 Cellulitis of unspecified toe: Secondary | ICD-10-CM | POA: Diagnosis not present

## 2021-11-03 ENCOUNTER — Telehealth: Payer: HMO

## 2021-11-04 DIAGNOSIS — M7989 Other specified soft tissue disorders: Secondary | ICD-10-CM | POA: Diagnosis not present

## 2021-11-08 ENCOUNTER — Ambulatory Visit: Payer: HMO | Admitting: Podiatry

## 2021-11-10 ENCOUNTER — Other Ambulatory Visit: Payer: Self-pay | Admitting: Family Medicine

## 2021-11-10 ENCOUNTER — Other Ambulatory Visit: Payer: Self-pay

## 2021-11-10 DIAGNOSIS — Z1501 Genetic susceptibility to malignant neoplasm of breast: Secondary | ICD-10-CM

## 2021-11-10 DIAGNOSIS — M5440 Lumbago with sciatica, unspecified side: Secondary | ICD-10-CM

## 2021-11-10 DIAGNOSIS — M62838 Other muscle spasm: Secondary | ICD-10-CM

## 2021-11-11 ENCOUNTER — Encounter: Payer: Self-pay | Admitting: Hematology and Oncology

## 2021-11-15 ENCOUNTER — Telehealth: Payer: Self-pay

## 2021-11-15 NOTE — Progress Notes (Signed)
Chronic Care Management Pharmacy Assistant   Name: Linda [name]  MRN: [id number] DOB: 1970-02-23   Reason for Encounter: Medication Coordination for [facility]    Recent office visits:  10/28/21 [name] MD. Seen for toe pain. No med changes.  Recent consult visits:  None  Hospital visits:  None  Medications: Outpatient Encounter Medications as of 11/15/2021  Medication Sig Note   albuterol (VENTOLIN HFA) 108 (90 Base) MCG/ACT inhaler Inhale 2 puffs into the lungs every 6 (six) hours as needed for wheezing or shortness of breath.    atorvastatin (LIPITOR) 10 MG tablet Take 1 tablet (10 mg total) by mouth daily.    Blood Glucose Monitoring Suppl (ONETOUCH VERIO REFLECT) w/Device KIT AS DIRECTED    buPROPion (WELLBUTRIN XL) 300 MG 24 hr tablet Take 1 tablet (300 mg total) by mouth every evening.    busPIRone (BUSPAR) 5 MG tablet Take 1 tablet (5 mg total) by mouth 3 (three) times daily.    calcium citrate-vitamin D (CITRACAL+D) 315-200 MG-UNIT tablet Take 1 tablet by mouth 2 (two) times daily.    clotrimazole-betamethasone (LOTRISONE) cream Apply twice daily as needed to area of concern    Continuous Blood Gluc Receiver (FREESTYLE LIBRE 2 READER) DEVI E11.69 Check blood sugar 4 times daily as directed    Continuous Blood Gluc Sensor (FREESTYLE LIBRE 2 SENSOR) MISC E11.69 Change sensor every 14 days as directed    cyclobenzaprine (FLEXERIL) 10 MG tablet TAKE ONE TABLET BY MOUTH every EIGHT hours AS NEEDED FOR muscle SPASMS    dicyclomine (BENTYL) 20 MG tablet TAKE ONE TABLET BY MOUTH BEFORE MEALS AND AT BEDTIME AS NEEDED FOR STOMACH CRAMPING    Dulaglutide (TRULICITY) 4.5 NL/8.9QJ SOPN Inject 4.5 mg as directed once a week.    Empagliflozin-metFORMIN HCl (SYNJARDY) 12.05-998 MG TABS Take 1 tablet by mouth 2 (two) times daily.    famotidine (PEPCID) 20 MG tablet Take 1 tablet (20 mg total) by mouth 2 (two) times daily.    ferrous sulfate 325 (65 FE) MG tablet Take 325 mg  by mouth every evening.    FETZIMA 80 MG CP24 TAKE ONE CAPSULE BY MOUTH EVERY EVENING    gabapentin (NEURONTIN) 300 MG capsule TAKE TWO CAPSULES BY MOUTH three times daily    glucose blood (ONETOUCH VERIO) test strip Use as instructed    insulin degludec (TRESIBA FLEXTOUCH) 200 UNIT/ML FlexTouch Pen Inject 52 Units into the skin daily.    levothyroxine (SYNTHROID) 75 MCG tablet Take 1 tablet (75 mcg total) by mouth daily.    lisdexamfetamine (VYVANSE) 70 MG capsule Take 1 capsule (70 mg total) by mouth daily.    losartan (COZAAR) 100 MG tablet Take 1 tablet (100 mg total) by mouth daily.    Magnesium 500 MG CAPS 500 mg.    morphine (MS CONTIN) 30 MG 12 hr tablet Take 1 tablet (30 mg total) by mouth every 12 (twelve) hours.    Multiple Vitamin (MULTIVITAMIN WITH MINERALS) TABS tablet Take 1 tablet by mouth daily.  06/08/2018: Pt plans on purchasing again when she can get out.   omega-3 acid ethyl esters (LOVAZA) 1 g capsule Take 2 capsules (2 g total) by mouth 2 (two) times daily.    ondansetron (ZOFRAN) 4 MG tablet Take 1 tablet (4 mg total) by mouth every 4 (four) hours as needed for nausea.    OneTouch Delica Lancets 19E MISC 1 each by Does not apply route daily before breakfast. Check blood sugar twice daily.  pantoprazole (PROTONIX) 40 MG tablet Take 1 tablet (40 mg total) by mouth 2 (two) times daily.    potassium chloride (MICRO-K) 10 MEQ CR capsule Take 2 capsules (20 mEq total) by mouth 2 (two) times daily.    rOPINIRole (REQUIP) 0.25 MG tablet Take 1 tablet (0.25 mg total) by mouth at bedtime.    Vitamin D, Ergocalciferol, (DRISDOL) 1.25 MG (50000 UNIT) CAPS capsule Take one capsule by mouth weekly    zolpidem (AMBIEN) 10 MG tablet One before bed    Facility-Administered Encounter Medications as of 11/15/2021  Medication   sodium chloride flush (NS) 0.9 % injection 10 mL    Reviewed chart for medication changes ahead of medication coordination call.  No Consults, or hospital  visits since last care coordination call/Pharmacist visit.   No medication changes indicated OR if recent visit, treatment plan here.  BP Readings from Last 3 Encounters:  10/28/21 120/80  10/07/21 (!) 155/74  10/01/21 116/68    Lab Results  Component Value Date   HGBA1C 9.6 (H) 10/01/2021     Patient obtains medications through Adherence Packaging  30 Days   Get sugar readings:  Last adherence delivery included:  Tresiba Flex Inj 200u- inject 52 units daily      Atorvastatin 18m 1 EM Synjardy 12.5-1000mg 1 B 1 EM Famotidine 276m1 B 1 EM Lostartan Pot 10083m EM Bupropion HCI XL 300m24mB Levothyroxine 75mc19mBB Pantoprazole 40mg 67m1 EM Zolpidem 10mg 126mPotassium Chloride ER 10MEQ 2 B 2 EM Fetzima ER 80mg  176mMorphine Sulfate ER 30 mg 1 tab every 12 hours (Bottle)       Buspirone 5mg 1 B 27m1 EM Vyvanse 70mg 1 B 73minirole 0.25mg 1 BT 69mza 1gm 2 B 2 EM Gabapentin 300mg 2 caps22m three times daily (Bottle)      Trulicity 4.5 inject 4.5mg once w2.1RZ             Vitamin D2 1250mcg 1 B on71mdays Freestyle Libre 2 sensors change every 14 days       Prochlorperazine 10mg 1 tab ev77m6 hours as needed (Bottle)        Cyclobenzaprine 10mg 1 tab eve77m hours as needed (Bottle) Onetouch Delica Lancets 30g Onetouch Ve73V Test Strips   Patient declined (meds) last month  Albuterol Inhaler- Does not need, prn  Ondansetron 4mg- has enough59m hand, uses prn   Patient is due for next adherence delivery on: 11/25/21. Called patient and reviewed medications and coordinated delivery.  This delivery to include: Tresiba Flex Inj 200u- inject 52 units daily      Atorvastatin 10mg 1 EM Synjar75m2.5-1000mg 1 B 1 EM Famotidine 20mg 1 B 1 EM Los83man Pot 100mg 1 EM Bupropio60mI XL 300mg 1 B Levothyrox73m75mcg 1 BB Pantopraz67m40mg 1 B 1 EM Zolpide28mmg 1 BT Potassium Ch30mde ER 10MEQ 2 B 2 EM Fetzima ER 80mg  1 EM Morphine Sul13m ER 30 mg 1 tab every 12  hours (Bottle)       Buspirone 5mg 1 B 1 L1 EM Vyvanse 10mg 1 B  Ropinirole 0.2561m BT Lovaza 1gm 2 B 222mGabapentin 300mg 2 capsules three times33mly (Bottle)      Trulicity 4.5 inject 4.5mg once weekly           6.7OLamin D2 1250mcg 1 B on Fridays Freesty28mibre 2 sensors change every 14 days  Prochlorperazine 73m 1 tab every 6 hours as needed (Bottle)        Cyclobenzaprine 166m1 tab every 8 hours as needed (Bottle) Onetouch Delica Lancets 3018Mnetouch Verio Test Strips   Patient declined the following medications  Albuterol Inhaler- Does not need, prn  Ondansetron 67m76mhas enough on hand, uses prn   Patient needs refills-Request Sent  Cyclobenzaprine 76m267mvanse 70mg46mrphine Sulfate ER 30 mg Atorvastatin 76mg 70mjardy 12.5-1000mg  Lostartan Pot 100mg P73mlorperazine 76mg -L21mrefill request we sent to [facility] sent in prochlorperazine denied refill and said that if patient is needing it refilled she will need to start getting refills from PCP.  Blood Sugar Readings: Pt has not had any recent readings due to losing the last Libre sensor and it was knocked off so she has been out of sensors so she will start reading them when she gets her new sensors.   Confirmed delivery date of 11/25/21, advised patient that pharmacy will contact them the morning of delivery.   DanielleElray McgregorinBenaist Assistant  [phone]

## 2021-11-16 ENCOUNTER — Other Ambulatory Visit: Payer: Self-pay

## 2021-11-16 ENCOUNTER — Other Ambulatory Visit: Payer: Self-pay | Admitting: Family Medicine

## 2021-11-16 DIAGNOSIS — M62838 Other muscle spasm: Secondary | ICD-10-CM

## 2021-11-16 DIAGNOSIS — F5081 Binge eating disorder: Secondary | ICD-10-CM

## 2021-11-16 MED ORDER — MORPHINE SULFATE ER 30 MG PO TBCR: 30.0000 mg | EXTENDED_RELEASE_TABLET | Freq: Two times a day (BID) | ORAL | 0 refills | Status: DC

## 2021-11-16 MED ORDER — LOSARTAN POTASSIUM 100 MG PO TABS: 100.0000 mg | ORAL_TABLET | Freq: Every day | ORAL | 0 refills | Status: DC

## 2021-11-16 MED ORDER — ATORVASTATIN CALCIUM 10 MG PO TABS: 10.0000 mg | ORAL_TABLET | Freq: Every day | ORAL | 0 refills | Status: DC

## 2021-11-16 MED ORDER — SYNJARDY 12.5-1000 MG PO TABS: 1.0000 | ORAL_TABLET | Freq: Two times a day (BID) | ORAL | 0 refills | Status: DC

## 2021-11-16 MED ORDER — CYCLOBENZAPRINE HCL 10 MG PO TABS: ORAL_TABLET | ORAL | 1 refills | Status: DC

## 2021-11-16 MED ORDER — LISDEXAMFETAMINE DIMESYLATE 70 MG PO CAPS: 70.0000 mg | ORAL_CAPSULE | Freq: Every day | ORAL | 0 refills | Status: DC

## 2021-11-17 NOTE — Telephone Encounter (Signed)
Compliant on meds

## 2021-11-17 NOTE — Telephone Encounter (Signed)
Yes sir, I will get assessments assigned.  Thank you   Pattricia Boss, [place] Pharmacist Assistant [phone]

## 2021-11-18 ENCOUNTER — Ambulatory Visit: Payer: PPO | Admitting: Podiatry

## 2021-11-22 ENCOUNTER — Other Ambulatory Visit: Payer: Self-pay | Admitting: Family Medicine

## 2021-11-22 ENCOUNTER — Other Ambulatory Visit: Payer: Self-pay | Admitting: Hematology and Oncology

## 2021-11-22 DIAGNOSIS — R110 Nausea: Secondary | ICD-10-CM

## 2021-11-22 MED ORDER — PROCHLORPERAZINE MALEATE 5 MG PO TABS: 5.0000 mg | ORAL_TABLET | Freq: Four times a day (QID) | ORAL | 3 refills | Status: DC | PRN

## 2021-11-25 ENCOUNTER — Telehealth: Payer: Self-pay | Admitting: Oncology

## 2021-11-25 NOTE — Telephone Encounter (Signed)
CT Chest has been scheduled for 12/10/21 arrive at 8am.Appt at 9am   LVM requesting pt give me a call back to notify her of date,time and instructions.

## 2021-11-30 ENCOUNTER — Telehealth: Payer: Self-pay

## 2021-11-30 NOTE — Chronic Care Management (AMB) (Signed)
11-30-2021: Left patient a VM requesting blood sugar readings and to schedule a telephone visit with [place] Constable in December.  12-02-2021: Left patient a VM requesting blood sugar readings and to schedule a telephone visit with [place] Constable in December.  12-03-2021: Left patient a VM requesting blood sugar readings and to schedule a telephone visit with [place] Constable in December.  [place] Pharmacist Assistant [phone]

## 2021-12-02 ENCOUNTER — Encounter: Payer: Self-pay | Admitting: Podiatry

## 2021-12-02 ENCOUNTER — Ambulatory Visit: Payer: HMO | Admitting: Podiatry

## 2021-12-02 DIAGNOSIS — L84 Corns and callosities: Secondary | ICD-10-CM

## 2021-12-02 DIAGNOSIS — M79674 Pain in right toe(s): Secondary | ICD-10-CM

## 2021-12-02 DIAGNOSIS — B351 Tinea unguium: Secondary | ICD-10-CM

## 2021-12-02 DIAGNOSIS — T451X5A Adverse effect of antineoplastic and immunosuppressive drugs, initial encounter: Secondary | ICD-10-CM

## 2021-12-02 DIAGNOSIS — M79675 Pain in left toe(s): Secondary | ICD-10-CM | POA: Diagnosis not present

## 2021-12-02 DIAGNOSIS — D6959 Other secondary thrombocytopenia: Secondary | ICD-10-CM

## 2021-12-02 DIAGNOSIS — E1165 Type 2 diabetes mellitus with hyperglycemia: Secondary | ICD-10-CM

## 2021-12-02 DIAGNOSIS — Z794 Long term (current) use of insulin: Secondary | ICD-10-CM

## 2021-12-02 NOTE — Progress Notes (Signed)
Subjective:  Patient ID: Linda [name], female    DOB: [birth year],  MRN: [id number]  Brya Simerly presents to clinic today for preventative diabetic foot care and callus(es) b/l lower extremities and painful thick toenails that are difficult to trim. Painful toenails interfere with ambulation. Aggravating factors include wearing enclosed shoe gear. Pain is relieved with periodic professional debridement. Painful calluses are aggravated when weightbearing with and without shoegear. Pain is relieved with periodic professional debridement.  Chief Complaint  Patient presents with   Nail Problem    DFC , pt states she has a diabetic ulcer on 3rd toe on left foot , she states there is 2 toe nails growing over top of each other  BG - 210 A1C - 7  PCP - Dr [name] , last OV October 2023     New problem(s):  Patient states she has a wound on her left 3rd digit. She relates it started as a blood blister. On that day, she relates her first 3 toes were red. She was seen in Urgent Care on 10/25/2021 and diagnosed with cellulitis of left 3rd toe. She was prescribed Augmentin. She was seen by Dr. Tobie Poet on 10/52023 and referred to Dr. Cannon Kettle at [facility]. Patient states she did see Dr. Cannon Kettle and was referred to Wound Care. Patient states she was seen in [place] and was told she has two toenails on her 3rd digit, one of which need to be removed. She will follow up with Wound Care on tomorrow.  She is applying Silver Dressing to wound. She denies any fever, chills, night sweats, nausea or vomiting.           PCP is [name], Elnita Maxwell, MD , and last visit was October 28, 2021.  Allergies  Allergen Reactions   Codeine Shortness Of Breath    Other reaction(s): SHOB   Augmentin [Amoxicillin-Pot Clavulanate] Diarrhea   Celecoxib Other (See Comments)    Unknown reaction Other reaction(s): Unknown   Ezetimibe     Other reaction(s): Unknown   Ezetimibe-Simvastatin Other (See  Comments)    Unknown reaction   Propranolol     Other reaction(s): Unknown   Propranolol Hcl Other (See Comments)    Unknown reaction   Simvastatin     Other reaction(s): Unknown    Review of Systems: Negative except as noted in the HPI.  Objective: No changes noted in today's physical examination.  Irianna Gilday is a pleasant 51 y.o. female in NAD. AAO x 3.  Vascular Examination: CFT <3 seconds b/l LE. Faintly palpable DP pulses b/l LE. Faintly palpable PT pulse(s) b/l LE. Pedal hair present. No pain with calf compression b/l. Lower extremity skin temperature gradient within normal limits. No edema noted b/l LE. No cyanosis or clubbing noted b/l LE.  Neurological Examination: Protective sensation diminished with 10g monofilament b/l.  Dermatological Examination: Pedal skin mildly dry. No open wounds b/l LE. No interdigital macerations noted b/l LE.   Toenails 1-5 b/l elongated, discolored, dystrophic, thickened, crumbly with subungual debris and tenderness to dorsal palpation.  Hyperkeratotic lesion(s) bilateral heels, L hallux, and submet head 5 right foot.  No erythema, no edema, no drainage, no fluctuance.  Left 3rd digit with full thickness wound with necrotic scab noted on periphery. No odor. Mild edema. Left 3rd digit toenail with subungual hemorrhage. Post nail removal, there is no nailbed ulceration noted. No purulence. Mild edema. Resolving erythema.         Musculoskeletal Examination: Muscle strength 5/5  to b/l LE. No pain, crepitus or joint limitation noted with ROM bilateral LE. Hammertoe deformity noted 2-5 b/l.  Radiographs: None Assessment/Plan: 1. Pain due to onychomycosis of toenails of both feet   2. Callus   3. Chemotherapy-induced thrombocytopenia   4. Type 2 diabetes mellitus with hyperglycemia, with long-term current use of insulin (HCC)     No orders of the defined types were placed in this encounter.   -Consent given for treatment as  described below: -Examined patient. -Debrided toenail left 3rd digit. Patient to follow up with Wound Care clinic. 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 -Callus(es) b/l feet, L hallux, and submet head 5 right foot pared utilizing sterile scalpel blade without complication or incident. Total number debrided =4. -Patient/POA to call should there be question/concern in the interim.   Return in about 3 months (around 03/04/2022).  Marzetta Board, DPM

## 2021-12-03 ENCOUNTER — Encounter: Payer: Self-pay | Admitting: Hematology and Oncology

## 2021-12-03 NOTE — Telephone Encounter (Signed)
error 

## 2021-12-12 NOTE — Progress Notes (Signed)
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 She was treated with bilateral mastectomy due to BRCA1 mutation.  She completed 4 cycles of Adriamycin/cyclophosphamide chemotherapy and received 8 out of 12 planned weeks of weekly paclitaxel.  She remains without evidence of recurrence.   BRCA1 positive Positive BRCA 1 mutation, which increases her risk for breast and ovarian cancer, as well as elevates her risk for pancreatic cancer.  She underwent hysterectomy/bilateral salpingo-oophorectomy.  The hepatologist recommended biannual screening for hepatocellular carcinoma with ultrasound alternating with cross-sectional imaging to allow screening for pancreatic cancer.  Right upper quadrant ultrasound in February revealed nodular hepatic contour with coarse hepatic echogenicity consistent with known cirrhosis.  No focal hepatic abnormality was identified. She had s CT abdomen in August, and this is stable.   Liver cirrhosis secondary to NASH (nonalcoholic steatohepatitis) (Linda [name]) Liver cirrhosis as seen on CT imaging in August 2020.  She was referred to Linda [name], [name], at the [name] in [name].  Hepatitis panel was negative.  She received the appropriate vaccines.  CT imaging in May 2022 was stable.  [name] recommended every 6 month screening for hepatocellular cancer with ultrasound alternating with cross-sectional imaging to allow for screening for pancreatic cancer.  Right upper quadrant ultrasound in February did not reveal any focal hepatic abnormality.  CT imaging in  August was to screen for hepatocellular due to cirrhosis and we see 2 tiny hypodense liver lesions in the right lobe measuring 4 mm each and stable from September 2021.  The pancreas appears norma, we are screening due to her BRCA1 mutation.   Idiopathic thrombocytopenic purpura (ITP) (HCC) Thrombocytopenia, which is felt to be due to chronic ITP and liver cirrhosis.  She continues to have regular blood work at Dr. Masco [name] office.  CBC in February revealed stable mild thrombocytopenia with a platelet count of 99,000.  Her platelet count today was back down to 74,000 last time but is now improved at 91,000.  The thrombocytopenia could also be due to her liver disease.  Persistent respiratory infection She has severe wheezing in all lung fields and has been through steroids as well as antibiotics with Rocephin, Zithromax and something else.  The CT scan does show some changes in the right lower lobe with some groundglass opacity and patchy tree-in-bud nodularity as well as a indistinct 8 mm nodule which is new.  I have contacted Dr. Tobie [name] and we discussed her management.  I will place her on Augmentin 875 mg twice daily for 10 days and also prescribe an inhaler.    I will place her on Augmentin 875 mg twice daily for 10 days and also prescribe an inhaler.  Dr. Tobie [name] will see her on Friday for follow-up to see if she has improved. We will schedule her for CT of the chest.  I agree that we can alternate ultrasounds with CT scans but I would recommend a CT scan yearly since she does have BRCA1 positivity and is at increased risk for pancreatic cancer.  I will see her back in 3 months with CBC, comp CMP, and AFP. The patient understands the  plans discussed today and is in agreement with them.  She knows to contact our office if she develops concerns prior to her next appointment.   I provided 30 minutes of face-to-face time during this encounter and > 50% was spent counseling as documented under my assessment and  plan.    Linda Kaplan, MD  [facility] [address] [place] [address] Dept: [phone] Dept Fax: [phone]   No orders of the defined types were placed in this encounter.     CHIEF COMPLAINT:  CC: Stage IB triple negative breast cancer  Current Treatment: Observation  HISTORY OF PRESENT ILLNESS:  Linda [name] is a 51 Y.O. female who we began seeing in February 2018 for evaluation of anemia.  Her anemia was felt to be secondary to iron deficiency and we recommended continuation of oral iron supplementation for a total of 6 months, as well as referral back to the gastroenterologist.  The patient also has a history of thrombocytopenia and had previously seen Dr. Bobby [name] in 2014.  The thrombocytopenia was felt to be secondary to mild chronic immune thrombocytopenic purpura (ITP).  Due to the patient's family history of malignancy, she underwent testing for hereditary cancer syndromes with the [employer] myRisk Hereditary Cancer Panel test.  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  She underwent a robotic hysterectomy and bilateral salpingo-oophorectomy in May 2018 with Dr. Everitt [name].  Pathology was benign.  We also discussed the option of risk reducing bilateral mastectomy, but she chose to have close surveillance, so we recommended annual MRI breast, in addition to annual mammography.  She was placed on chemoprevention with raloxifene in September 2018.  CT abdomen and pelvis in August 2020 done for bilateral flank pain, revealed a tiny left renal calculus, as well as probable hepatic cirrhosis without evidence of hepatic mass.  She was then referred to [place] and is followed by Linda [name], CRNP in [place] for her liver cirrhosis.  She tested  negative for hepatitis A, B, and C.  She received her hepatitis vaccines in 2020.   We saw her in September 2021 for a new diagnosis of stage IB (T1c N0 M0) triple negative right breast cancer.  She underwent screening bilateral mammogram on August 3rd which revealed possible masses in the right breast.  Diagnostic right mammogram and right breast ultrasound from August 18th confirmed suspicious masses in the right breast at 5 o'clock measuring 1.5 cm and 6 o'clock measuring 1.4 cm.  There was an indeterminate 4 mm with possible distortion in the outer right breast without sonographic correlate.  Right axillary ultrasound was normal.  She then underwent biopsies of both masses and surgical pathology from these procedures revealed  invasive ductal carcinoma, grade 3, with necrosis at 5 o'clock; and invasive ductal carcinoma, grade 1-2, with necrosis and focal myxoid change at 6 o'clock.  No DCIS was identified.  Breast prognostic profiles revealed HER2, and estrogen and progesterone receptors to be negative. Ki67 was 30% at the 5 o'clock mass, and 40% at 6 o'clock.  She underwent bilateral mastectomies on September 24th with Dr. Noberto Retort. Surgical pathology from this procedure revealed invasive ductal carcinoma, grade 3, with myxoid change, 19 mm, at 6 o'clock, and invasive ductal carcinoma, grade 3, and ductal carcinoma in situ, 19 mm, at  5 o'clock.  All margins were negative for invasive carcinoma or DCIS.  Two sentinel lymph nodes were negative for metastatic carcinoma (0/2). CT chest, abdomen and pelvis in September did not reveal any evidence of metastatic disease.  She started dose dense AC chemotherapy on October 25th , with plans to follow this with weekly paclitaxel for 12 doses.  She had significantly worsened anemia with a hemoglobin of 8.7, as well as pancytopenia, so the 4th cycle of chemotherapy was delayed, but she finally got her last dose on January 4th. Her anemia has slowly improved.  She started  weekly paclitaxel on January 26th and has received 8 out of 12 planned doses.   She did have some expected pancytopenia.  She had pre-existing neuropathy of the feet and legs.     [facility] was admitted to the [name] for osteomyelitis of the left 2nd toe in March 2022.  She was treated with empiric vancomycin and Zosyn.  Amputation was recommended but the patient refused.  Cultures returned with Klebsiella, sensitive to IV Rocephin, so long term antibiotics 2 g Q24H for continued for the next 6 weeks. At that time, we decided to discontinue weekly paclitaxel, as she had received 8/12 cycles and had significant neuropathy.  She has done fairly well since that time.  She has mild chronic thrombocytopenia felt to be chronic ITP, although, this could be secondary to her liver disease.  In February 2023, a liver ultrasound did not reveal any liver masses.  [name] recommended alternating ultrasound with cross-sectional imaging to allow for pancreatic cancer screening due to her BRCA1 mutation.  INTERVAL HISTORY:  Linda [name] is here today for repeat clinical assessment.  She states she has been doing fairly well.  She did have a CT scan of the abdomen which is overall stable.  There are 2 tiny hypodense lesions in the right liver, both measuring 4 mm and stable from scan of September 2021.  The pancreas appears normal.  She has mild to moderate splenomegaly which is stable.  She has mild paraumbilical varices which are stable.  The only new finding is some patchy tree-in-bud nodularity and ground glass opacity in the dependent medial basilar right lower lobe with an indistinct 8 mm nodule which is new from scan of September 2021.  She complains of soreness of the left mid chest wall, which is consistent with costochondritis.  She does complain of a cough for over a month and has been on various antibiotics and steroids and is still not clear.  She had an injection at urgent care which we think was Rocephin and later had a  course of Zithromax.  She has also been on a Medrol Dosepak.  She denies any nausea, vomiting, diarrhea, constipation or abdominal pain.  She has had previous back surgery.  She denies fevers or chills. She denies pain. Her appetite is good.  She still has chronic lower back pain and has known osteoarthritis and degenerative changes.  Her labs show a platelet count improved from 74,000 to 91,000 and her hemoglobin has improved from 11.6 to 12.2.  CMP is normal other than a nonfasting blood sugar of 237, but she has had recent corticosteroids.  REVIEW OF SYSTEMS:  Review of Systems  Constitutional: Negative.  Negative for appetite change, chills, fatigue, fever and unexpected weight change.  HENT:  Negative.  Negative for lump/mass, mouth sores and sore throat.   Eyes: Negative.   Respiratory:  Positive for cough and wheezing. Negative for shortness of  breath.   Cardiovascular:  Negative for chest pain and leg swelling.  Gastrointestinal: Negative.  Negative for abdominal pain, constipation, diarrhea, nausea and vomiting.  Endocrine: Negative.  Negative for hot flashes.  Genitourinary: Negative.  Negative for difficulty urinating, dysuria, frequency and hematuria.   Musculoskeletal:  Positive for back pain. Negative for arthralgias and myalgias.  Skin: Negative.  Negative for rash.  Neurological: Negative.  Negative for dizziness and headaches.  Hematological:  Negative for adenopathy. Bruises/bleeds easily.  Psychiatric/Behavioral: Negative.  Negative for depression and sleep disturbance. The patient is not nervous/anxious.      VITALS:  Last menstrual period 06/13/2009.  Wt Readings from Last 3 Encounters:  10/28/21 211 lb (95.7 kg)  10/07/21 210 lb (95.3 kg)  10/01/21 209 lb (94.8 kg)    There is no height or weight on file to calculate BMI.  Performance status (ECOG): 1 - Symptomatic but completely ambulatory  PHYSICAL EXAM:  Physical Exam Vitals and nursing note reviewed.   Constitutional:      General: She is not in acute distress.    Appearance: Normal appearance.  HENT:     Head: Normocephalic and atraumatic.     Mouth/Throat:     Mouth: Mucous membranes are moist.     Pharynx: Oropharynx is clear. No oropharyngeal exudate or posterior oropharyngeal erythema.  Eyes:     General: No scleral icterus.    Extraocular Movements: Extraocular movements intact.     Conjunctiva/sclera: Conjunctivae normal.     Pupils: Pupils are equal, round, and reactive to light.  Cardiovascular:     Rate and Rhythm: Normal rate and regular rhythm.     Heart sounds: Normal heart sounds. No murmur heard.    No friction rub. No gallop.  Pulmonary:     Effort: Pulmonary effort is normal.     Breath sounds: Rhonchi present. No wheezing or rales.     Comments: Expiratory rhonchi in all lung fields, fairly severe Chest:     Chest wall: Tenderness present.  Breasts:    Right: Absent.     Left: Absent.     Comments: Bilateral mastectomy sites are negative. Abdominal:     General: There is no distension.     Palpations: Abdomen is soft. There is hepatomegaly and splenomegaly. There is no mass.     Tenderness: There is no abdominal tenderness.  Musculoskeletal:        General: Normal range of motion.     Cervical back: Normal range of motion and neck supple. No tenderness.     Right lower leg: No edema.     Left lower leg: No edema.  Lymphadenopathy:     Cervical: No cervical adenopathy.     Upper Body:     Right upper body: No supraclavicular or axillary adenopathy.     Left upper body: No supraclavicular or axillary adenopathy.     Lower Body: No right inguinal adenopathy. No left inguinal adenopathy.  Skin:    General: Skin is warm and dry.     Coloration: Skin is not jaundiced.     Findings: No rash.  Neurological:     Mental Status: She is alert and oriented to person, place, and time.     Cranial Nerves: No cranial nerve deficit.  Psychiatric:        Mood and  Affect: Mood normal.        Behavior: Behavior normal.        Thought Content: Thought content normal.  LABS:      Latest Ref Rng & Units 10/01/2021    8:26 AM 09/13/2021   12:00 AM 06/25/2021   12:00 AM  CBC  WBC 3.4 - 10.8 x10E3/uL 5.9  4.7     4.5      Hemoglobin 11.1 - 15.9 g/dL 12.1  12.2     11.6      Hematocrit 34.0 - 46.6 % 39.5  38     37      Platelets 150 - 450 x10E3/uL 82  91     74         This result is from an external source.       Latest Ref Rng & Units 10/01/2021    8:26 AM 09/13/2021   12:00 AM 06/25/2021   12:00 AM  CMP  Glucose 70 - 99 mg/dL 139     BUN 6 - 24 mg/dL _0 Creatinine 0.57 - 1.00 mg/dL 0.91  0.8     0.8      Sodium 134 - 144 mmol/L 141  138     141      Potassium 3.5 - 5.2 mmol/L 4.3  4.0     4.4      Chloride 96 - 106 mmol/L 102  103     102      CO2 20 - 29 mmol/L _1 Calcium 8.7 - 10.2 mg/dL 9.5  8.9     9.1      Total Protein 6.0 - 8.5 g/dL 6.5     Total Bilirubin 0.0 - 1.2 mg/dL 0.5     Alkaline Phos 44 - 121 IU/L 120  111     82      AST 0 - 40 IU/L _2 ALT 0 - 32 IU/L _3 This result is from an external source.      No results found for: "CEA1", "CEA" / No results found for: "CEA1", "CEA" No results found for: "PSA1" No results found for: "ZHY865" No results found for: "CAN125"  No results found for: "TOTALPROTELP", "ALBUMINELP", "A1GS", "A2GS", "BETS", "BETA2SER", "GAMS", "MSPIKE", "SPEI" Lab Results  Component Value Date   TIBC 312 05/20/2020   TIBC 300 01/13/2020   FERRITIN 98 05/20/2020   IRONPCTSAT 26 05/20/2020   IRONPCTSAT 24.3 01/13/2020   No results found for: "LDH"  STUDIES:  No results found.    HISTORY:   Past Medical History:  Diagnosis Date   Acute postoperative respiratory insufficiency 04/17/2020   AKI (acute kidney injury) ([place]) 04/17/2020   Anemia    Anemia, unspecified    Anemia, unspecified    Anemia, unspecified    Anxiety     Arthritis    BRCA gene mutation positive    Chronic pain syndrome    Depression    Diabetes mellitus without complication ([place])    type 2   Diabetic ulcer of toe of left foot associated with type 2 diabetes mellitus, with fat layer exposed ([name]) 03/27/2020   Gastritis    Genetic susceptibility to malignant neoplasm of breast    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 No longer has    Past Surgical History:  Procedure Laterality Date   APPENDECTOMY     BACK SURGERY     x 3 lower disc   BILATERAL TOTAL MASTECTOMY WITH AXILLARY LYMPH NODE DISSECTION Bilateral 09/2019   CHOLECYSTECTOMY     nephrolithiasis     ROBOTIC ASSISTED TOTAL HYSTERECTOMY WITH BILATERAL SALPINGO OOPHERECTOMY Bilateral 06/14/2016   Procedure: ROBOTIC ASSISTED TOTAL HYSTERECTOMY WITH BILATERAL SALPINGO OOPHORECTOMY;  Surgeon: Nancy Marus, MD;  Location: [facility];  Service: Gynecology;  Laterality: Bilateral;    Family History  Problem Relation Age of Onset   Cancer Mother        breast   CAD Father    Diabetes Father    Heart failure Father    Cancer Father        renal carcinoma   Kidney failure Father    Cancer Brother 68       renal carcinoma.   Stroke Paternal Grandmother     Social History:  reports that she has never smoked. She has never used smokeless tobacco. She reports that she does not drink alcohol and does not use drugs.The patient is alone today.  Allergies:  Allergies  Allergen Reactions   Codeine Shortness Of Breath    Other reaction(s): SHOB   Augmentin  [Amoxicillin-Pot Clavulanate] Diarrhea   Celecoxib Other (See Comments)    Unknown reaction Other reaction(s): Unknown   Ezetimibe     Other reaction(s): Unknown   Ezetimibe-Simvastatin Other (See Comments)    Unknown reaction   Propranolol     Other reaction(s): Unknown   Propranolol Hcl Other (See Comments)    Unknown reaction   Simvastatin     Other reaction(s): Unknown    Current Medications: Current Outpatient Medications  Medication Sig Dispense Refill   albuterol (VENTOLIN HFA) 108 (90 Base) MCG/ACT inhaler Inhale 2 puffs into the lungs every 6 (six) hours as needed for wheezing or shortness of breath. 8 g 1   atorvastatin (LIPITOR) 10 MG tablet Take 1 tablet (10 mg total) by mouth daily. 90 tablet 0   Blood Glucose Monitoring Suppl (ONETOUCH VERIO REFLECT) w/Device KIT AS DIRECTED     buPROPion (WELLBUTRIN XL) 300 MG 24 hr tablet Take 1 tablet (300 mg total) by mouth every evening. 90 tablet 1   busPIRone (BUSPAR) 5 MG tablet Take 1 tablet (5 mg total) by mouth 3 (three) times daily. 90 tablet 2   calcium citrate-vitamin D (CITRACAL+D) 315-200 MG-UNIT tablet Take 1 tablet by mouth 2 (two) times daily.     clindamycin (CLEOCIN) 300 MG capsule Take by mouth every 6 (six) hours.     clotrimazole-betamethasone (LOTRISONE) cream Apply twice daily as needed to area of concern 45 g 2   Continuous Blood Gluc Receiver (FREESTYLE LIBRE 2 READER) DEVI E11.69 Check blood sugar 4 times daily as directed 1 each 0   Continuous Blood Gluc Sensor (FREESTYLE LIBRE 2 SENSOR) MISC E11.69 Change sensor every 14 days as directed 6 each 3   cyclobenzaprine (FLEXERIL) 10 MG tablet Take one tablet by mouth every 8 hours as needed  for muscle spasms 270 tablet 1   dicyclomine (BENTYL) 20 MG tablet TAKE ONE TABLET BY MOUTH BEFORE MEALS AND AT BEDTIME AS NEEDED FOR STOMACH CRAMPING 180 tablet 1   Dulaglutide (TRULICITY) 4.5 YN/8.2NF SOPN Inject 4.5 mg as directed once a week. 6 mL 0    Empagliflozin-metFORMIN HCl (SYNJARDY) 12.05-998 MG TABS Take 1 tablet by mouth 2 (two) times daily. 180 tablet 0   famotidine (PEPCID) 20 MG tablet Take 1 tablet (20 mg total) by mouth 2 (two) times daily. 180 tablet 1   ferrous sulfate 325 (65 FE) MG tablet Take 325 mg by mouth every evening.     FETZIMA 80 MG CP24 TAKE ONE CAPSULE BY MOUTH EVERY EVENING 90 capsule 1   gabapentin (NEURONTIN) 300 MG capsule TAKE TWO CAPSULES BY MOUTH three times daily 180 capsule 1   glucose blood (ONETOUCH VERIO) test strip Use as instructed 100 each 12   insulin degludec (TRESIBA FLEXTOUCH) 200 UNIT/ML FlexTouch Pen Inject 52 Units into the skin daily. 9 mL 3   levothyroxine (SYNTHROID) 75 MCG tablet Take 1 tablet (75 mcg total) by mouth daily. 90 tablet 1   lisdexamfetamine (VYVANSE) 70 MG capsule Take 1 capsule (70 mg total) by mouth daily. 30 capsule 0   losartan (COZAAR) 100 MG tablet Take 1 tablet (100 mg total) by mouth daily. 90 tablet 0   Magnesium 500 MG CAPS 500 mg.     morphine (MS CONTIN) 30 MG 12 hr tablet Take 1 tablet (30 mg total) by mouth every 12 (twelve) hours. 60 tablet 0   Multiple Vitamin (MULTIVITAMIN WITH MINERALS) TABS tablet Take 1 tablet by mouth daily.      mupirocin ointment (BACTROBAN) 2 % Apply topically 2 (two) times daily.     omega-3 acid ethyl esters (LOVAZA) 1 g capsule Take 2 capsules (2 g total) by mouth 2 (two) times daily. 360 capsule 1   ondansetron (ZOFRAN) 4 MG tablet Take 1 tablet (4 mg total) by mouth every 4 (four) hours as needed for nausea. 90 tablet 3   OneTouch Delica Lancets 62Z MISC 1 each by Does not apply route daily before breakfast. Check blood sugar twice daily. 100 each 3   pantoprazole (PROTONIX) 40 MG tablet Take 1 tablet (40 mg total) by mouth 2 (two) times daily. 180 tablet 1   potassium chloride (MICRO-K) 10 MEQ CR capsule Take 2 capsules (20 mEq total) by mouth 2 (two) times daily. 360 capsule 0   prochlorperazine (COMPAZINE) 10 MG tablet Take 10  mg by mouth every 6 (six) hours as needed.     rOPINIRole (REQUIP) 0.25 MG tablet Take 1 tablet (0.25 mg total) by mouth at bedtime. 90 tablet 1   Vitamin D, Ergocalciferol, (DRISDOL) 1.25 MG (50000 UNIT) CAPS capsule Take one capsule by mouth weekly 12 capsule 1   zolpidem (AMBIEN) 10 MG tablet One before bed 30 tablet 5   No current facility-administered medications for this visit.   Facility-Administered Medications Ordered in Other Visits  Medication Dose Route Frequency Provider Last Rate Last Admin   sodium chloride flush (NS) 0.9 % injection 10 mL  10 mL Intracatheter PRN Mosher, Kelli A, PA-C   10 mL at 02/19/21 1004

## 2021-12-13 ENCOUNTER — Inpatient Hospital Stay: Payer: HMO | Attending: Hematology and Oncology | Admitting: Oncology

## 2021-12-13 ENCOUNTER — Encounter: Payer: Self-pay | Admitting: Oncology

## 2021-12-13 ENCOUNTER — Other Ambulatory Visit: Payer: Self-pay | Admitting: Oncology

## 2021-12-13 ENCOUNTER — Telehealth: Payer: Self-pay | Admitting: Oncology

## 2021-12-13 ENCOUNTER — Other Ambulatory Visit: Payer: Self-pay

## 2021-12-13 ENCOUNTER — Telehealth: Payer: Self-pay

## 2021-12-13 VITALS — BP 143/94 | HR 113 | Temp 98.4°F | Resp 20 | Ht 62.0 in | Wt 211.9 lb

## 2021-12-13 DIAGNOSIS — R911 Solitary pulmonary nodule: Secondary | ICD-10-CM

## 2021-12-13 DIAGNOSIS — C50311 Malignant neoplasm of lower-inner quadrant of right female breast: Secondary | ICD-10-CM | POA: Diagnosis not present

## 2021-12-13 DIAGNOSIS — K7460 Unspecified cirrhosis of liver: Secondary | ICD-10-CM

## 2021-12-13 DIAGNOSIS — E1121 Type 2 diabetes mellitus with diabetic nephropathy: Secondary | ICD-10-CM

## 2021-12-13 DIAGNOSIS — R918 Other nonspecific abnormal finding of lung field: Secondary | ICD-10-CM

## 2021-12-13 DIAGNOSIS — Z1501 Genetic susceptibility to malignant neoplasm of breast: Secondary | ICD-10-CM | POA: Diagnosis not present

## 2021-12-13 DIAGNOSIS — K7581 Nonalcoholic steatohepatitis (NASH): Secondary | ICD-10-CM

## 2021-12-13 DIAGNOSIS — F50819 Binge eating disorder, unspecified: Secondary | ICD-10-CM

## 2021-12-13 DIAGNOSIS — F5081 Binge eating disorder: Secondary | ICD-10-CM

## 2021-12-13 DIAGNOSIS — Z1509 Genetic susceptibility to other malignant neoplasm: Secondary | ICD-10-CM

## 2021-12-13 DIAGNOSIS — Z171 Estrogen receptor negative status [ER-]: Secondary | ICD-10-CM

## 2021-12-13 NOTE — Progress Notes (Signed)
Chronic Care Management Pharmacy Assistant   Name: Linda [name]  MRN: [id number] DOB: 06/07/1970   Reason for Encounter: Medication Review/ Medication Coordination  Recent office visits:  None  Recent consult visits:  12-03-2021 Janeann Forehand, [facility] (Urgent care). Visit for sore throat.   12-02-2021 Marzetta Board, DPM (Podiatry). Debrided toenail left 3rd digit. Patient to follow up with Wound Care clinic. 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 -Callus(es) b/l feet, L hallux, and submet head 5 right foot pared utilizing sterile scalpel blade without complication or incident. Total number debrided =4. INCREASE Compazine 5 mg every 6 hours PRN TO 10 mg every 6 hours PRN.  Hospital visits:  None in previous 6 months  Medications: Outpatient Encounter Medications as of 12/13/2021  Medication Sig Note   albuterol (VENTOLIN HFA) 108 (90 Base) MCG/ACT inhaler Inhale 2 puffs into the lungs every 6 (six) hours as needed for wheezing or shortness of breath.    atorvastatin (LIPITOR) 10 MG tablet Take 1 tablet (10 mg total) by mouth daily.    Blood Glucose Monitoring Suppl (ONETOUCH VERIO REFLECT) w/Device KIT AS DIRECTED    buPROPion (WELLBUTRIN XL) 300 MG 24 hr tablet Take 1 tablet (300 mg total) by mouth every evening.    busPIRone (BUSPAR) 5 MG tablet Take 1 tablet (5 mg total) by mouth 3 (three) times daily.    calcium citrate-vitamin D (CITRACAL+D) 315-200 MG-UNIT tablet Take 1 tablet by mouth 2 (two) times daily.    clindamycin (CLEOCIN) 300 MG capsule Take by mouth every 6 (six) hours.    clotrimazole-betamethasone (LOTRISONE) cream Apply twice daily as needed to area of concern    Continuous Blood Gluc Receiver (FREESTYLE LIBRE 2 READER) DEVI E11.69 Check blood sugar 4 times daily as directed    Continuous Blood Gluc Sensor (FREESTYLE LIBRE 2 SENSOR) MISC E11.69 Change sensor every 14  days as directed    cyclobenzaprine (FLEXERIL) 10 MG tablet Take one tablet by mouth every 8 hours as needed for muscle spasms    dicyclomine (BENTYL) 20 MG tablet TAKE ONE TABLET BY MOUTH BEFORE MEALS AND AT BEDTIME AS NEEDED FOR STOMACH CRAMPING    Dulaglutide (TRULICITY) 4.5 GB/0.2XJ SOPN Inject 4.5 mg as directed once a week.    Empagliflozin-metFORMIN HCl (SYNJARDY) 12.05-998 MG TABS Take 1 tablet by mouth 2 (two) times daily.    famotidine (PEPCID) 20 MG tablet Take 1 tablet (20 mg total) by mouth 2 (two) times daily.    ferrous sulfate 325 (65 FE) MG tablet Take 325 mg by mouth every evening.    FETZIMA 80 MG CP24 TAKE ONE CAPSULE BY MOUTH EVERY EVENING    gabapentin (NEURONTIN) 300 MG capsule TAKE TWO CAPSULES BY MOUTH three times daily    glucose blood (ONETOUCH VERIO) test strip Use as instructed    insulin degludec (TRESIBA FLEXTOUCH) 200 UNIT/ML FlexTouch Pen Inject 52 Units into the skin daily.    levothyroxine (SYNTHROID) 75 MCG tablet Take 1 tablet (75 mcg total) by mouth daily.    lisdexamfetamine (VYVANSE) 70 MG capsule Take 1 capsule (70 mg total) by mouth daily.    losartan (COZAAR) 100 MG tablet Take 1 tablet (100 mg total) by mouth daily.    Magnesium 500 MG CAPS 500 mg.    morphine (MS CONTIN) 30 MG 12 hr tablet Take 1 tablet (30 mg total) by mouth every 12 (twelve) hours.  Multiple Vitamin (MULTIVITAMIN WITH MINERALS) TABS tablet Take 1 tablet by mouth daily.  06/08/2018: Pt plans on purchasing again when she can get out.   mupirocin ointment (BACTROBAN) 2 % Apply topically 2 (two) times daily.    omega-3 acid ethyl esters (LOVAZA) 1 g capsule Take 2 capsules (2 g total) by mouth 2 (two) times daily.    ondansetron (ZOFRAN) 4 MG tablet Take 1 tablet (4 mg total) by mouth every 4 (four) hours as needed for nausea.    OneTouch Delica Lancets 96Q MISC 1 each by Does not apply route daily before breakfast. Check blood sugar twice daily.    pantoprazole (PROTONIX) 40 MG tablet  Take 1 tablet (40 mg total) by mouth 2 (two) times daily.    potassium chloride (MICRO-K) 10 MEQ CR capsule Take 2 capsules (20 mEq total) by mouth 2 (two) times daily.    prochlorperazine (COMPAZINE) 10 MG tablet Take 10 mg by mouth every 6 (six) hours as needed.    rOPINIRole (REQUIP) 0.25 MG tablet Take 1 tablet (0.25 mg total) by mouth at bedtime.    Vitamin D, Ergocalciferol, (DRISDOL) 1.25 MG (50000 UNIT) CAPS capsule Take one capsule by mouth weekly    zolpidem (AMBIEN) 10 MG tablet One before bed    Facility-Administered Encounter Medications as of 12/13/2021  Medication   sodium chloride flush (NS) 0.9 % injection 10 mL  Reviewed chart for medication changes ahead of medication coordination call.  No OVs, Consults, or hospital visits since last care coordination call/Pharmacist visit. (If appropriate, list visit date, provider name)  No medication changes indicated OR if recent visit, treatment plan here.  BP Readings from Last 3 Encounters:  10/28/21 120/80  10/07/21 (!) 155/74  10/01/21 116/68    Lab Results  Component Value Date   HGBA1C 9.6 (H) 10/01/2021     Patient obtains medications through Adherence Packaging  30 Days   Last adherence delivery included: Tresiba Flex Inj 200u- inject 52 units daily      Atorvastatin 10m 1 EM Synjardy 12.5-1000mg 1 B 1 EM Famotidine 221m1 B 1 EM Lostartan Pot 10043m EM Bupropion HCI XL 300m39mB Levothyroxine 75mc1mBB Pantoprazole 40mg 75m1 EM Zolpidem 10mg 176mPotassium Chloride ER 10MEQ 2 B 2 EM Fetzima ER 80mg  146mMorphine Sulfate ER 30 mg 1 tab every 12 hours (Bottle)       Buspirone 5mg 1 B 72m1 EM Vyvanse 70mg 1 B 14minirole 0.25mg 1 BT 68mza 1gm 2 B 2 EM Gabapentin 300mg 2 caps87m three times daily (Bottle)      Trulicity 4.5 inject 4.5mg once w2.2LN             Vitamin D2 1250mcg 1 B on82mdays Freestyle Libre 2 sensors change every 14 days       Prochlorperazine 10mg 1 tab ev41m6 hours as needed  (Bottle)        Cyclobenzaprine 10mg 1 tab eve61m hours as needed (Bottle) Onetouch Delica Lancets 30g Onetouch Ve98X Test Strips    Patient declined (meds) last month: Albuterol Inhaler- Does not need, prn  Ondansetron 4mg- has enough64m hand, uses prn  Patient is due for next adherence delivery on: 12-24-2021  Called patient and reviewed medications and coordinated delivery.  This delivery to include: Tresiba Flex Inj 200u- inject 52 units daily      Atorvastatin 10mg 1 EM Synjar59m2.5-1000mg 1 B 1 EM Famotidine  42m 1 B 1 EM Lostartan Pot 1011m1 EM Bupropion HCI XL 30052m B Levothyroxine 68m68m BB Pantoprazole 40mg38m 1 EM Zolpidem 10mg 59m Potassium Chloride ER 10MEQ 2 B 2 EM Fetzima ER 80mg  14m Morphine Sulfate ER 30 mg 1 tab every 12 hours (Bottle)       Buspirone 5mg 1 B70mL1 EM Vyvanse 70mg 1 B2mpinirole 0.25mg 1 BT82maza 1gm 2 B 2 EM Gabapentin 300mg 2 cap10ms three times daily (Bottle)      Trulicity 4.5 inject 4.5mg once 2.0UOy             Vitamin D2 1250mcg 1 B o12midays Freestyle Libre 2 sensors change every 14 days       Prochlorperazine 10mg 1 tab e43m 6 hours as needed (Bottle)        Cyclobenzaprine 10mg 1 tab ev50m8 hours as needed (Bottle) Onetouch Delica Lancets 30g Onetouch V15Io Test Strips   Patient declined the following medications:  Unable to reach patient  Patient needs refills for: Sent to PCP Morphine  Buspirone  Vyvanse  Trulicity   Unable to Confirm delivery date of 12-01-202 pharmacy will contact them the morning of delivery.  12-13-2021: 1st attempt left VM 12-14-2021: 2nd attempt left VM 12-15-2021: 3rd attempt left VM  NOES: Unable to reach patient for delivery and blood sugar readings. Will attempt to call in 2 weeks for readings  Care Gaps: Shingrix overdue AWV overdue Covid booster overdue  Star Rating Drugs: Losartan 100 mg- Last filled 11-22-2021 30 DS. Previous 10-21-2021 30 DS Atorvastatin 10 mg-  Last filled 11-22-2021 30 DS. Previous 10-21-2021 30 [id number] 4.5 mg- Last filled 11-22-2021 30 DS. Previous 10-21-2021 30 DS Synjardy 12.05-998 mg- Last filled 11-22-2021 30 DS. Previous 10-21-2021 30 DS  Malecca Hicks Osbornmacist Assistant [phone]

## 2021-12-13 NOTE — Progress Notes (Signed)
[facility]  [address] [place],  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 She was treated with bilateral mastectomy due to BRCA1 mutation.  She completed 4 cycles of Adriamycin/cyclophosphamide chemotherapy and received 8 out of 12 planned weeks of weekly paclitaxel.  She remains without evidence of recurrence.   BRCA1 positive Positive BRCA 1 mutation, which increases her risk for breast and ovarian cancer, as well as elevates her risk for pancreatic cancer.  She underwent hysterectomy/bilateral salpingo-oophorectomy.  The hepatologist recommended biannual screening for hepatocellular carcinoma with ultrasound alternating with cross-sectional imaging to allow screening for pancreatic cancer.  Right upper quadrant ultrasound in February revealed nodular hepatic contour with coarse hepatic echogenicity consistent with known cirrhosis.  No focal hepatic abnormality was identified. She had s CT abdomen in August 2023, and this is stable.   Liver cirrhosis secondary to NASH (nonalcoholic steatohepatitis) ([place]) Liver cirrhosis as seen on CT imaging in August 2020.  She was referred to [facility], [place], at the [facility] in [place].  Hepatitis panel was negative.  She received the appropriate vaccines.  CT imaging in May 2022 was stable.  [name] recommended every 6 month screening for hepatocellular cancer with ultrasound alternating with cross-sectional imaging to allow for screening for pancreatic cancer.  Right upper quadrant ultrasound in February did not reveal any focal hepatic abnormality.  CT imaging  in August was to screen for hepatocellular cancer due to cirrhosis and we see 2 tiny hypodense liver lesions in the right lobe measuring 4 mm each and stable from September 2021.  The pancreas appears normal, we are screening due to her BRCA1 mutation.   Idiopathic thrombocytopenic purpura (ITP) (HCC) Thrombocytopenia, which is felt to be due to chronic ITP and liver cirrhosis.  She continues to have regular blood work at Dr. [employer] office.  CBC in February revealed stable mild thrombocytopenia with a platelet count of 99,000.  Her platelet count today was back down to 74,000 last time but is now improved at 91,000.  The thrombocytopenia could also be due to her liver disease.   Plan: CT chest results from 12/10/21 showed resolution of tree-in-bud nodularity in the posterior right lung base and a 52m left upper lobe pulmonary nodule, new since previous CTA chest 04/08/2020. I will see patient in between visits with Dr. CTobie Poet We will see her back in 4 months with CBC, CMP, and AFP as well as repeat CT chest without contrast prior to her visit. We will plan to flush her port at that time. I agree that we can alternate ultrasounds with CT scans but I would recommend a CT scan yearly since she does have BRCA1 positivity and is at increased risk for pancreatic cancer. I strongly encouraged her to discontinue Ambien to mitigate falls. She is agreeable to try Melatonin instead. The patient understands the plans discussed today and is in agreement with them.  She knows to contact our office if she develops concerns prior to her next appointment.   I provided 30 minutes of face-to-face time during this encounter and > 50% was spent counseling as documented under my assessment and plan.    CAltha Harm  Kathrin Ruddy, MD  [facility] [address] [place] [address] Dept: [phone] Dept Fax: [phone]   No orders of the defined types were placed  in this encounter.     CHIEF COMPLAINT:  CC: Stage IB triple negative breast cancer  Current Treatment: Observation  HISTORY OF PRESENT ILLNESS:  Linda [name] is a 51 Y.O. female who we began seeing in February 2018 for evaluation of anemia.  Her anemia was felt to be secondary to iron deficiency and we recommended continuation of oral iron supplementation for a total of 6 months, as well as referral back to the gastroenterologist.  The patient also has a history of thrombocytopenia and had previously seen Dr. Bobby Rumpf in 2014.  The thrombocytopenia was felt to be secondary to mild chronic immune thrombocytopenic purpura (ITP).  Due to the patient's family history of malignancy, she underwent testing for hereditary cancer syndromes with the Myriad myRisk Hereditary Cancer Panel test.  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  She underwent a robotic hysterectomy and bilateral salpingo-oophorectomy in May 2018 with Dr. Everitt Amber.  Pathology was benign.  We also discussed the option of risk reducing bilateral mastectomy, but she chose to have close surveillance, so we recommended annual MRI breast, in addition to annual mammography.  She was placed on chemoprevention with raloxifene in September 2018.  CT abdomen and pelvis in August 2020 done for bilateral flank pain, revealed a tiny left renal calculus, as well as probable hepatic cirrhosis without evidence of hepatic mass.  She was then referred to [place] and is followed by Roosevelt Locks, CRNP in [place] for her liver cirrhosis.  She tested negative for hepatitis A, B, and C.  She received her hepatitis vaccines in 2020.   We saw her in September 2021 for a new diagnosis of stage IB (T1c N0 M0) triple negative right breast cancer.  She underwent screening bilateral mammogram on  August 3rd which revealed possible masses in the right breast.  Diagnostic right mammogram and right breast ultrasound from August 18th confirmed suspicious masses in the right breast at 5 o'clock measuring 1.5 cm and 6 o'clock measuring 1.4 cm.  There was an indeterminate 4 mm with possible distortion in the outer right breast without sonographic correlate.  Right axillary ultrasound was normal.  She then underwent biopsies of both masses and surgical pathology from these procedures revealed  invasive ductal carcinoma, grade 3, with necrosis at 5 o'clock; and invasive ductal carcinoma, grade 1-2, with necrosis and focal myxoid change at 6 o'clock.  No DCIS was identified.  Breast prognostic profiles revealed HER2, and estrogen and progesterone receptors to be negative. Ki67 was 30% at the 5 o'clock mass, and 40% at 6 o'clock.  She underwent bilateral mastectomies on September 24th with Dr. Noberto Retort. Surgical pathology from this procedure revealed invasive ductal carcinoma, grade 3, with myxoid change, 19 mm, at 6 o'clock, and invasive ductal carcinoma, grade 3, and ductal carcinoma in situ, 19 mm, at 5 o'clock.  All margins were negative for invasive carcinoma or DCIS.  Two sentinel lymph nodes were negative for metastatic carcinoma (0/2). CT chest, abdomen and pelvis in September did not reveal any evidence of metastatic disease.  She started dose dense AC chemotherapy on October 25th , with plans to follow this  with weekly paclitaxel for 12 doses.  She had significantly worsened anemia with a hemoglobin of 8.7, as well as pancytopenia, so the 4th cycle of chemotherapy was delayed, but she finally got her last dose on January 4th. Her anemia has slowly improved.  She started weekly paclitaxel on January 26th and has received 8 out of 12 planned doses.   She did have some expected pancytopenia.  She had pre-existing neuropathy of the feet and legs.     She was admitted to the hospital for osteomyelitis of the  left 2nd toe in March 2022.  She was treated with empiric vancomycin and Zosyn.  Amputation was recommended but the patient refused.  Cultures returned with Klebsiella, sensitive to IV Rocephin, so long term antibiotics 2 g Q24H for continued for the next 6 weeks. At that time, we decided to discontinue weekly paclitaxel, as she had received 8/12 cycles and had significant neuropathy.  She has done fairly well since that time.  She has mild chronic thrombocytopenia felt to be chronic ITP, although, this could be secondary to her liver disease.  In February 2023, a liver ultrasound did not reveal any liver masses.  [name] recommended alternating ultrasound with cross-sectional imaging to allow for pancreatic cancer screening due to her BRCA1 mutation.  INTERVAL HISTORY:  Joslyne is here today for repeat clinical assessment for Stage IB triple negative breast cancer. She states that overall she has been doing well. However, she states that she fell off of her toilet a ~1 week ago after waking up in the middle of the night to urinate. She does not remember getting up to urinate but remembers waking up in her recliner afterward. She thinks that a hairspray can fell, hitting her head at the time. She reports a knot on her posterior head and an abrasion with bruising on her right elbow following the fall. Patient also notes some bruising on her right upper back. She thinks that she may have taken an additional Ambien that night as she has noticed on 4 separate instances this month that her daily pill packages contained an accidental extra Ambien. She is considering taking Melatonin instead of the Ambien to mitigate blackouts. She reports intermittent dyspnea. She states that she has had persistent hoarseness for several months. She reports a second episode in the past wherein she tripped and fell outside of the clinic in the grass. She required assistance from nurses to get back up afterward. Today, we reviewed her CT  chest results from 12/10/21 which showed resolution of tree-in-bud nodularity in the posterior right lung base and a 49m left upper lobe pulmonary nodule, new since previous CTA chest 04/08/2020. She reports that her port was flushed when she had her CT a few days ago. She notes that she has a persistent left 2nd toe wound which has improved with multiple rounds of antibiotics. She is followed by podiatry and the wound center. Her PCP Dr. CTobie Poetis also following her closely. She reports that her BGL has been poorly controlled which she attributes to a unhealthy dietary habits. She denies signs of infection such as sore throat, sinus drainage, cough, or urinary symptoms.  She denies fevers or recurrent chills. She denies nausea, vomiting, chest pain, or cough. Her weight has been stable.   REVIEW OF SYSTEMS:  Review of Systems  Constitutional:  Negative for appetite change, chills, fever and unexpected weight change.  HENT:  Negative.  Negative for lump/mass, mouth sores and sore throat.  Eyes: Negative.   Respiratory:  Positive for shortness of breath. Negative for chest tightness, cough, hemoptysis and wheezing.   Cardiovascular: Negative.  Negative for chest pain, leg swelling and palpitations.  Gastrointestinal: Negative.  Negative for abdominal distention, abdominal pain, blood in stool, constipation, diarrhea, nausea and vomiting.  Endocrine: Negative.   Genitourinary: Negative.  Negative for difficulty urinating, dysuria, frequency and hematuria.   Musculoskeletal:  Positive for arthralgias and myalgias. Negative for back pain, flank pain and gait problem.  Skin:  Positive for wound (left 2nd toe).       + bruising   Neurological:  Negative for dizziness, extremity weakness, gait problem, headaches, light-headedness, numbness, seizures and speech difficulty.  Hematological: Negative.  Negative for adenopathy. Does not bruise/bleed easily.  Psychiatric/Behavioral: Negative.  Negative for  depression and sleep disturbance. The patient is not nervous/anxious.      VITALS:  Blood pressure (!) 143/94, pulse (!) 113, temperature 98.4 F (36.9 C), resp. rate 20, height _0  (1.575 m), weight 211 lb 14.4 oz (96.1 kg), last menstrual period 06/13/2009, SpO2 96 %.  Wt Readings from Last 3 Encounters:  12/13/21 211 lb 14.4 oz (96.1 kg)  10/28/21 211 lb (95.7 kg)  10/07/21 210 lb (95.3 kg)    Body mass index is 38.76 kg/m.  Performance status (ECOG): 1 - Symptomatic but completely ambulatory  PHYSICAL EXAM:  Physical Exam Constitutional:      General: She is not in acute distress.    Appearance: Normal appearance. She is normal weight.  HENT:     Head: Normocephalic.     Comments: Soft cystic-feeling lump at the vertex of her scalp which I feel is benign Eyes:     General: No scleral icterus.    Extraocular Movements: Extraocular movements intact.     Conjunctiva/sclera: Conjunctivae normal.     Pupils: Pupils are equal, round, and reactive to light.  Cardiovascular:     Rate and Rhythm: Normal rate and regular rhythm.     Pulses: Normal pulses.     Heart sounds: Normal heart sounds. No murmur heard.    No friction rub. No gallop.  Pulmonary:     Effort: Pulmonary effort is normal. No respiratory distress.     Breath sounds: Normal breath sounds.  Chest:     Comments: Both mastectomies scars have mild nodularity consistent with scar tissue.   Abdominal:     General: Bowel sounds are normal. There is no distension.     Palpations: Abdomen is soft. There is no hepatomegaly, splenomegaly or mass.     Tenderness: There is no abdominal tenderness.  Musculoskeletal:        General: Normal range of motion.     Cervical back: Normal range of motion and neck supple.     Right lower leg: No edema.     Left lower leg: Edema (mild) present.     Comments: Resolving ecchymosis and resolving abrasion in right upper shoulder.  Lymphadenopathy:     Cervical: No cervical  adenopathy.     Right cervical: No superficial, deep or posterior cervical adenopathy.    Left cervical: No superficial, deep or posterior cervical adenopathy.     Upper Body:     Right upper body: No supraclavicular, axillary or pectoral adenopathy.     Left upper body: No supraclavicular, axillary or pectoral adenopathy.  Skin:    General: Skin is warm and dry.     Findings: Abrasion present.     Comments: Fairly  large abrasion of the posterior right elbow area. Does not appear to be infected.  Neurological:     General: No focal deficit present.     Mental Status: She is alert and oriented to person, place, and time. Mental status is at baseline.  Psychiatric:        Mood and Affect: Mood normal.        Behavior: Behavior normal.        Thought Content: Thought content normal.        Judgment: Judgment normal.     LABS:      Latest Ref Rng & Units 10/01/2021    8:26 AM 09/13/2021   12:00 AM 06/25/2021   12:00 AM  CBC  WBC 3.4 - 10.8 x10E3/uL 5.9  4.7     4.5      Hemoglobin 11.1 - 15.9 g/dL 12.1  12.2     11.6      Hematocrit 34.0 - 46.6 % 39.5  38     37      Platelets 150 - 450 x10E3/uL 82  91     74         This result is from an external source.      Latest Ref Rng & Units 10/01/2021    8:26 AM 09/13/2021   12:00 AM 06/25/2021   12:00 AM  CMP  Glucose 70 - 99 mg/dL 139     BUN 6 - 24 mg/dL _0 Creatinine 0.57 - 1.00 mg/dL 0.91  0.8     0.8      Sodium 134 - 144 mmol/L 141  138     141      Potassium 3.5 - 5.2 mmol/L 4.3  4.0     4.4      Chloride 96 - 106 mmol/L 102  103     102      CO2 20 - 29 mmol/L _1 Calcium 8.7 - 10.2 mg/dL 9.5  8.9     9.1      Total Protein 6.0 - 8.5 g/dL 6.5     Total Bilirubin 0.0 - 1.2 mg/dL 0.5     Alkaline Phos 44 - 121 IU/L 120  111     82      AST 0 - 40 IU/L _2 ALT 0 - 32 IU/L _3 This result is from an external source.     No results found for: "CEA1", "CEA" / No  results found for: "CEA1", "CEA" No results found for: "PSA1" No results found for: "ZOX096" No results found for: "CAN125"  No results found for: "TOTALPROTELP", "ALBUMINELP", "A1GS", "A2GS", "BETS", "BETA2SER", "GAMS", "MSPIKE", "SPEI" Lab Results  Component Value Date   TIBC 312 05/20/2020   TIBC 300 01/13/2020   FERRITIN 98 05/20/2020   IRONPCTSAT 26 05/20/2020   IRONPCTSAT 24.3 01/13/2020   No results found for: "LDH"  STUDIES:  No results found.    CT Chest w contrast 12/10/2021 Impression: Interval resolution of tree-in-bud nodularity seen in the posterior right lung base on abdomen CT 09/10/21. 43m left upper lobe pulmonary nodule, new since previous CTA chest 04/08/2020. Features suggest that this may be atelectasis or infection/inflammatory etiology. Given the  history of breast cancer, follow-up CT chest wo contrast in 3 months recommended to re-evaluate. Cirrhosis with probable splenomegaly.   CT Abdomen 09/10/2021   HISTORY:   Past Medical History:  Diagnosis Date   Acute postoperative respiratory insufficiency 04/17/2020   AKI (acute kidney injury) ([place]) 04/17/2020   Anemia    Anemia, unspecified    Anemia, unspecified    Anemia, unspecified    Anxiety    Arthritis    BRCA gene mutation positive    Chronic pain syndrome    Depression    Diabetes mellitus without complication ([place])    type 2   Diabetic ulcer of toe of left foot associated with type 2 diabetes mellitus, with fat layer exposed ([place]) 03/27/2020   Gastritis    Genetic susceptibility to malignant neoplasm of breast    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 No longer has    Past Surgical History:  Procedure Laterality Date   APPENDECTOMY     BACK SURGERY     x 3 lower disc   BILATERAL TOTAL MASTECTOMY WITH AXILLARY LYMPH NODE DISSECTION Bilateral 09/2019   CHOLECYSTECTOMY     nephrolithiasis     ROBOTIC ASSISTED TOTAL HYSTERECTOMY WITH BILATERAL SALPINGO OOPHERECTOMY Bilateral 06/14/2016   Procedure: ROBOTIC ASSISTED TOTAL HYSTERECTOMY WITH BILATERAL SALPINGO OOPHORECTOMY;  Surgeon: Nancy Marus, MD;  Location: [facility];  Service: Gynecology;  Laterality: Bilateral;    Family History  Problem Relation Age of Onset   Cancer Mother        breast   CAD Father    Diabetes Father    Heart failure Father    Cancer Father        renal carcinoma   Kidney failure Father    Cancer Brother 13       renal carcinoma.   Stroke Paternal Grandmother     Social History:  reports that she has never smoked. She has never used smokeless tobacco. She reports that she does not drink alcohol and does not use drugs.The patient is alone today.  Allergies:  Allergies  Allergen Reactions   Codeine Shortness Of Breath    Other reaction(s): SHOB   Augmentin [Amoxicillin-Pot Clavulanate] Diarrhea   Celecoxib Other (See Comments)    Unknown reaction Other reaction(s): Unknown   Ezetimibe     Other reaction(s): Unknown   Ezetimibe-Simvastatin Other (See Comments)    Unknown reaction   Propranolol     Other reaction(s): Unknown   Propranolol Hcl Other (See Comments)    Unknown reaction   Simvastatin     Other reaction(s): Unknown    Current Medications: Current Outpatient Medications  Medication Sig Dispense Refill   albuterol (VENTOLIN HFA) 108 (90 Base) MCG/ACT inhaler Inhale 2 puffs into the lungs every 6 (six) hours as needed for wheezing or shortness of breath. 8 g 1   atorvastatin (LIPITOR) 10 MG tablet Take 1 tablet (10 mg total) by mouth daily. 90  tablet 0   Blood Glucose Monitoring Suppl [facility]  VERIO REFLECT) w/Device KIT AS DIRECTED     Blood Pressure Monitoring (SPHYGMOMANOMETER) MISC 1 each by Does not apply route daily in the afternoon. 1 each 0   buPROPion (WELLBUTRIN XL) 300 MG 24 hr tablet Take 1 tablet (300 mg total) by mouth every evening. 90 tablet 1   busPIRone (BUSPAR) 5 MG tablet Take 1 tablet (5 mg total) by mouth 3 (three) times daily. 90 tablet 0   calcium citrate-vitamin D (CITRACAL+D) 315-200 MG-UNIT tablet Take 1 tablet by mouth 2 (two) times daily.     clindamycin (CLEOCIN) 300 MG capsule Take by mouth every 6 (six) hours.     clotrimazole-betamethasone (LOTRISONE) cream Apply twice daily as needed to area of concern 45 g 2   Continuous Blood Gluc Receiver (FREESTYLE LIBRE 2 READER) DEVI E11.69 Check blood sugar 4 times daily as directed 1 each 0   Continuous Blood Gluc Sensor (FREESTYLE LIBRE 2 SENSOR) MISC E11.69 Change sensor every 14 days as directed 6 each 3   cyclobenzaprine (FLEXERIL) 10 MG tablet Take one tablet by mouth every 8 hours as needed for muscle spasms 270 tablet 1   dicyclomine (BENTYL) 20 MG tablet TAKE ONE TABLET BY MOUTH BEFORE MEALS AND AT BEDTIME AS NEEDED FOR STOMACH CRAMPING 180 tablet 1   Dulaglutide (TRULICITY) 4.5 XV/4.0GQ SOPN Inject 4.5 mg as directed once a week. 6 mL 0   Empagliflozin-metFORMIN HCl (SYNJARDY) 12.05-998 MG TABS Take 1 tablet by mouth 2 (two) times daily. 180 tablet 0   famotidine (PEPCID) 20 MG tablet Take 1 tablet (20 mg total) by mouth 2 (two) times daily. 180 tablet 1   ferrous sulfate 325 (65 FE) MG tablet Take 325 mg by mouth every evening.     FETZIMA 80 MG CP24 TAKE ONE CAPSULE BY MOUTH EVERY EVENING 90 capsule 1   gabapentin (NEURONTIN) 300 MG capsule TAKE TWO CAPSULES BY MOUTH three times daily 180 capsule 1   glucose blood (ONETOUCH VERIO) test strip Use as instructed 100 each 12   insulin degludec (TRESIBA FLEXTOUCH) 200 UNIT/ML FlexTouch Pen Inject 52 Units  into the skin daily. 9 mL 3   levothyroxine (SYNTHROID) 75 MCG tablet Take 1 tablet (75 mcg total) by mouth daily. 90 tablet 1   lisdexamfetamine (VYVANSE) 70 MG capsule Take 1 capsule (70 mg total) by mouth daily. 30 capsule 0   losartan (COZAAR) 100 MG tablet Take 1 tablet (100 mg total) by mouth daily. 90 tablet 0   Magnesium 500 MG CAPS 500 mg.     morphine (MS CONTIN) 30 MG 12 hr tablet Take 1 tablet (30 mg total) by mouth every 12 (twelve) hours. 60 tablet 0   Multiple Vitamin (MULTIVITAMIN WITH MINERALS) TABS tablet Take 1 tablet by mouth daily.      mupirocin ointment (BACTROBAN) 2 % Apply topically 2 (two) times daily.     omega-3 acid ethyl esters (LOVAZA) 1 g capsule Take 2 capsules (2 g total) by mouth 2 (two) times daily. 360 capsule 1   ondansetron (ZOFRAN) 4 MG tablet Take 1 tablet (4 mg total) by mouth every 4 (four) hours as needed for nausea. 90 tablet 3   OneTouch Delica Lancets 67Y MISC 1 each by Does not apply route daily before breakfast. Check blood sugar twice daily. 100 each 3   pantoprazole (PROTONIX) 40 MG tablet Take 1 tablet (40 mg total) by mouth 2 (two) times daily. 180 tablet 1   potassium chloride (MICRO-K) 10 MEQ CR capsule Take  2 capsules (20 mEq total) by mouth 2 (two) times daily. 360 capsule 0   prochlorperazine (COMPAZINE) 10 MG tablet Take 10 mg by mouth every 6 (six) hours as needed.     rOPINIRole (REQUIP) 0.25 MG tablet Take 1 tablet (0.25 mg total) by mouth at bedtime. 90 tablet 1   Vitamin D, Ergocalciferol, (DRISDOL) 1.25 MG (50000 UNIT) CAPS capsule Take one capsule by mouth weekly 12 capsule 1   zolpidem (AMBIEN) 10 MG tablet One before bed 30 tablet 5   No current facility-administered medications for this visit.   Facility-Administered Medications Ordered in Other Visits  Medication Dose Route Frequency Provider Last Rate Last Admin   sodium chloride flush (NS) 0.9 % injection 10 mL  10 mL Intracatheter PRN Mosher, Vida Roller A, PA-C   10 mL at  02/19/21 1004    I,Alexis Herring,acting as a scribe for Derwood Kaplan, MD.,have documented all relevant documentation on the behalf of Derwood Kaplan, MD,as directed by  Derwood Kaplan, MD while in the presence of Derwood Kaplan, MD.  I have reviewed this report as typed by the medical scribe, and it is complete and accurate.  Derwood Kaplan   12/30/21 6:55 PM

## 2021-12-13 NOTE — Telephone Encounter (Signed)
Patient has been scheduled for follow-up visit per 12/13/21 los. Pt given an appt calendar with date and time.

## 2021-12-14 ENCOUNTER — Encounter: Payer: Self-pay | Admitting: Oncology

## 2021-12-14 MED ORDER — BUSPIRONE HCL 5 MG PO TABS: 5.0000 mg | ORAL_TABLET | Freq: Three times a day (TID) | ORAL | 0 refills | Status: DC

## 2021-12-14 MED ORDER — LISDEXAMFETAMINE DIMESYLATE 70 MG PO CAPS: 70.0000 mg | ORAL_CAPSULE | Freq: Every day | ORAL | 0 refills | Status: DC

## 2021-12-14 MED ORDER — TRULICITY 4.5 MG/0.5ML ~~LOC~~ SOAJ: 4.5000 mg | SUBCUTANEOUS | 0 refills | Status: DC

## 2021-12-14 MED ORDER — MORPHINE SULFATE ER 30 MG PO TBCR: 30.0000 mg | EXTENDED_RELEASE_TABLET | Freq: Two times a day (BID) | ORAL | 0 refills | Status: DC

## 2021-12-15 NOTE — Telephone Encounter (Signed)
Compliant on meds

## 2021-12-21 ENCOUNTER — Telehealth: Payer: Self-pay

## 2021-12-21 NOTE — Telephone Encounter (Signed)
Patient called this morning stating that she has carpet bugs and believes she is having a possible reaction to the bugs. I spoke with Dr. Tobie Poet, who has recommended for the patient to go to the UC to be seen due to no openings today. Patient notified.

## 2021-12-23 ENCOUNTER — Other Ambulatory Visit: Payer: Self-pay

## 2021-12-23 DIAGNOSIS — I152 Hypertension secondary to endocrine disorders: Secondary | ICD-10-CM

## 2021-12-23 MED ORDER — SPHYGMOMANOMETER MISC: 1.0000 | Freq: Every day | 0 refills | Status: AC

## 2021-12-29 ENCOUNTER — Telehealth: Payer: Self-pay

## 2021-12-29 NOTE — Chronic Care Management (AMB) (Signed)
Chronic Care Management Pharmacy Assistant   Name: Linda [name]  MRN: [id number] DOB: Jun 10, 1970  Reason for Encounter: Blood sugar readings  12-29-2021: 12-05 150, 12-04 147, 12-03 155, 12-02 145, 12-01 140. Patient reported not feeling well but respiratory symptoms have been present for a while now.  Appointment was scheduled with [place] Constable for 01-19-2022.  Medications: Outpatient Encounter Medications as of 12/29/2021  Medication Sig Note   albuterol (VENTOLIN HFA) 108 (90 Base) MCG/ACT inhaler Inhale 2 puffs into the lungs every 6 (six) hours as needed for wheezing or shortness of breath.    atorvastatin (LIPITOR) 10 MG tablet Take 1 tablet (10 mg total) by mouth daily.    Blood Glucose Monitoring Suppl (ONETOUCH VERIO REFLECT) w/Device KIT AS DIRECTED    Blood Pressure Monitoring (SPHYGMOMANOMETER) MISC 1 each by Does not apply route daily in the afternoon.    buPROPion (WELLBUTRIN XL) 300 MG 24 hr tablet Take 1 tablet (300 mg total) by mouth every evening.    busPIRone (BUSPAR) 5 MG tablet Take 1 tablet (5 mg total) by mouth 3 (three) times daily.    calcium citrate-vitamin D (CITRACAL+D) 315-200 MG-UNIT tablet Take 1 tablet by mouth 2 (two) times daily.    clindamycin (CLEOCIN) 300 MG capsule Take by mouth every 6 (six) hours.    clotrimazole-betamethasone (LOTRISONE) cream Apply twice daily as needed to area of concern    Continuous Blood Gluc Receiver (FREESTYLE LIBRE 2 READER) DEVI E11.69 Check blood sugar 4 times daily as directed    Continuous Blood Gluc Sensor (FREESTYLE LIBRE 2 SENSOR) MISC E11.69 Change sensor every 14 days as directed    cyclobenzaprine (FLEXERIL) 10 MG tablet Take one tablet by mouth every 8 hours as needed for muscle spasms    dicyclomine (BENTYL) 20 MG tablet TAKE ONE TABLET BY MOUTH BEFORE MEALS AND AT BEDTIME AS NEEDED FOR STOMACH CRAMPING    Dulaglutide (TRULICITY) 4.5 WU/9.8JX SOPN Inject 4.5 mg as directed once a week.     Empagliflozin-metFORMIN HCl (SYNJARDY) 12.05-998 MG TABS Take 1 tablet by mouth 2 (two) times daily.    famotidine (PEPCID) 20 MG tablet Take 1 tablet (20 mg total) by mouth 2 (two) times daily.    ferrous sulfate 325 (65 FE) MG tablet Take 325 mg by mouth every evening.    FETZIMA 80 MG CP24 TAKE ONE CAPSULE BY MOUTH EVERY EVENING    gabapentin (NEURONTIN) 300 MG capsule TAKE TWO CAPSULES BY MOUTH three times daily    glucose blood (ONETOUCH VERIO) test strip Use as instructed    insulin degludec (TRESIBA FLEXTOUCH) 200 UNIT/ML FlexTouch Pen Inject 52 Units into the skin daily.    levothyroxine (SYNTHROID) 75 MCG tablet Take 1 tablet (75 mcg total) by mouth daily.    lisdexamfetamine (VYVANSE) 70 MG capsule Take 1 capsule (70 mg total) by mouth daily.    losartan (COZAAR) 100 MG tablet Take 1 tablet (100 mg total) by mouth daily.    Magnesium 500 MG CAPS 500 mg.    morphine (MS CONTIN) 30 MG 12 hr tablet Take 1 tablet (30 mg total) by mouth every 12 (twelve) hours.    Multiple Vitamin (MULTIVITAMIN WITH MINERALS) TABS tablet Take 1 tablet by mouth daily.  06/08/2018: Pt plans on purchasing again when she can get out.   mupirocin ointment (BACTROBAN) 2 % Apply topically 2 (two) times daily.    omega-3 acid ethyl esters (LOVAZA) 1 g capsule Take 2 capsules (2 g total) by  mouth 2 (two) times daily.    ondansetron (ZOFRAN) 4 MG tablet Take 1 tablet (4 mg total) by mouth every 4 (four) hours as needed for nausea.    OneTouch Delica Lancets 21Y MISC 1 each by Does not apply route daily before breakfast. Check blood sugar twice daily.    pantoprazole (PROTONIX) 40 MG tablet Take 1 tablet (40 mg total) by mouth 2 (two) times daily.    potassium chloride (MICRO-K) 10 MEQ CR capsule Take 2 capsules (20 mEq total) by mouth 2 (two) times daily.    prochlorperazine (COMPAZINE) 10 MG tablet Take 10 mg by mouth every 6 (six) hours as needed.    rOPINIRole (REQUIP) 0.25 MG tablet Take 1 tablet (0.25 mg total) by  mouth at bedtime.    Vitamin D, Ergocalciferol, (DRISDOL) 1.25 MG (50000 UNIT) CAPS capsule Take one capsule by mouth weekly    zolpidem (AMBIEN) 10 MG tablet One before bed    Facility-Administered Encounter Medications as of 12/29/2021  Medication   sodium chloride flush (NS) 0.9 % injection 10 mL    [place] Clinical Pharmacist Assistant [phone]

## 2021-12-30 ENCOUNTER — Encounter: Payer: Self-pay | Admitting: Hematology and Oncology

## 2021-12-30 DIAGNOSIS — R911 Solitary pulmonary nodule: Secondary | ICD-10-CM | POA: Insufficient documentation

## 2022-01-05 NOTE — Assessment & Plan Note (Addendum)
Well controlled.  No changes to medicines. Taking Atorvastatin 10 mg 1 tablet daily, lovaza 1 gm 2 capsules twice daily. Continue to work on eating a healthy diet and exercise.  Labs drawn today.

## 2022-01-05 NOTE — Assessment & Plan Note (Signed)
Control: Uncontrolled Recommend check sugars fasting daily. Recommend check feet daily. Recommend annual eye exams. Medicines: trulicity to 4.5 mg weekly. Increase tresiba to 60 U before bed, Synjardy 12.5 mg/ 1000 mg 1 tablet twice daily Continue to work on eating a healthy diet and exercise.  Labs drawn today.

## 2022-01-05 NOTE — Assessment & Plan Note (Signed)
Control: Not at goal Recommend check sugars fasting daily. Recommend check feet daily. Recommend annual eye exams. Medicines: Trulicity 4.5 mg injection once weekly, Synjardy 12.5 mg/ 1000 mg 1 tablet twice daily, Tresiba inject 60 units daily. Continue to work on eating a healthy diet and exercise.  Labs drawn today.

## 2022-01-05 NOTE — Assessment & Plan Note (Signed)
The current medical regimen is effective;  continue present plan and medications. Samples of Vraylar given  Refer to the counselling

## 2022-01-05 NOTE — Progress Notes (Signed)
Subjective:  Patient ID: Linda [name], female    DOB: [birth year]  Age: 51 y.o. MRN: [id number]  Chief Complaint  Patient presents with   Diabetes   Hypertension   Hyperlipidemia    HPI:   Patient is here for a regular follow up. According to the patient, her blood sugars are not under control, her anxiety and depression level are through the roof and she has a new skin infestation that is bothering her lately. She has a exterminator coming to her apartment but she stills sees bugs crawling around. She has multiple small, erythematous papules on her BL hands and back.  She has recently lost her best friend, her car needs to be repaired, because of all this stress she thinks her blood sugar is shooting high and she is more depressed.  Diabetes:  Complications: HYPERTENSION, CKD Glucose checking: CGM Glucose logs: 180-400. Patient has had steroids twice in the last month because of bronchitis. Hypoglycemia: no  Most recent A1C: 9.6 Current medications: Trulicity 4.5 mg injection once weekly, Synjardy 12.5 mg/ 1000 mg 1 tablet twice daily, Tresiba inject 60 units daily. Last Eye Exam: 05/29/21 Foot checks: Daily  Hyperlipidemia: Current medications: Taking Atorvastatin 10 mg 1 tablet daily, lovaza 1 gm 2 capsules twice daily.   Hypertensive kidney disease with Stage 3B chronic kidney disease: Complications: DM Current medications: Losartan potassium 50 mg take 1 tablet daily, Potassium Chloride 10 meq take 2 capsules BID  Acquired Hypothyroidism: Patient is currently taking Levothyroxine 75 mg daily.  Chronic Pain Syndrome: Chronic back pain  has worsened. On Morphine 30 mg take 1 tablet twice daily, Gabapentin 300 mg take 2 capsules three times a day.   Binge eating disorder: Patient is currently taking Vyvanse 70 mg  Vitamin D deficiency: Patient is currently taking Vitamin D 50,000 units 1 capsule weekly.  GERD: [facility] did not send Pantoprazole 40 mg 1 tablet TWICE  DAILY in pill pack, Famotidine 20 mg 1 tablet twice daily .  Depression, mild, recurrent: Patient is currently taking Fetzima 80 mg daily, Bupropion 300 mg 1 tablet daily, Buspirone 5 mg take 1 tablet three times a day. Gets depressed intermittently. Medications working well.   IBS D: has dicyclomine that uses as needed.   Current Outpatient Medications on File Prior to Visit  Medication Sig Dispense Refill   albuterol (VENTOLIN HFA) 108 (90 Base) MCG/ACT inhaler Inhale 2 puffs into the lungs every 6 (six) hours as needed for wheezing or shortness of breath. 8 g 1   atorvastatin (LIPITOR) 10 MG tablet Take 1 tablet (10 mg total) by mouth daily. 90 tablet 0   Blood Glucose Monitoring Suppl (ONETOUCH VERIO REFLECT) w/Device KIT AS DIRECTED     Blood Pressure Monitoring (SPHYGMOMANOMETER) MISC 1 each by Does not apply route daily in the afternoon. 1 each 0   buPROPion (WELLBUTRIN XL) 300 MG 24 hr tablet Take 1 tablet (300 mg total) by mouth every evening. 90 tablet 1   calcium citrate-vitamin D (CITRACAL+D) 315-200 MG-UNIT tablet Take 1 tablet by mouth 2 (two) times daily.     clotrimazole-betamethasone (LOTRISONE) cream Apply twice daily as needed to area of concern 45 g 2   Continuous Blood Gluc Receiver (FREESTYLE LIBRE 2 READER) DEVI E11.69 Check blood sugar 4 times daily as directed 1 each 0   Continuous Blood Gluc Sensor (FREESTYLE LIBRE 2 SENSOR) MISC E11.69 Change sensor every 14 days as directed 6 each 3   cyclobenzaprine (FLEXERIL) 10 MG tablet Take  one tablet by mouth every 8 hours as needed for muscle spasms 270 tablet 1   dicyclomine (BENTYL) 20 MG tablet TAKE ONE TABLET BY MOUTH BEFORE MEALS AND AT BEDTIME AS NEEDED FOR STOMACH CRAMPING 180 tablet 1   Empagliflozin-metFORMIN HCl (SYNJARDY) 12.05-998 MG TABS Take 1 tablet by mouth 2 (two) times daily. 180 tablet 0   famotidine (PEPCID) 20 MG tablet Take 1 tablet (20 mg total) by mouth 2 (two) times daily. 180 tablet 1   ferrous sulfate  325 (65 FE) MG tablet Take 325 mg by mouth every evening.     FETZIMA 80 MG CP24 TAKE ONE CAPSULE BY MOUTH EVERY EVENING 90 capsule 1   glucose blood (ONETOUCH VERIO) test strip Use as instructed 100 each 12   levothyroxine (SYNTHROID) 75 MCG tablet Take 1 tablet (75 mcg total) by mouth daily. 90 tablet 1   losartan (COZAAR) 100 MG tablet Take 1 tablet (100 mg total) by mouth daily. 90 tablet 0   Magnesium 500 MG CAPS 500 mg.     Multiple Vitamin (MULTIVITAMIN WITH MINERALS) TABS tablet Take 1 tablet by mouth daily.      mupirocin ointment (BACTROBAN) 2 % Apply topically 2 (two) times daily.     omega-3 acid ethyl esters (LOVAZA) 1 g capsule Take 2 capsules (2 g total) by mouth 2 (two) times daily. 360 capsule 1   ondansetron (ZOFRAN) 4 MG tablet Take 1 tablet (4 mg total) by mouth every 4 (four) hours as needed for nausea. 90 tablet 3   pantoprazole (PROTONIX) 40 MG tablet Take 1 tablet (40 mg total) by mouth 2 (two) times daily. 180 tablet 1   prochlorperazine (COMPAZINE) 10 MG tablet Take 10 mg by mouth every 6 (six) hours as needed.     rOPINIRole (REQUIP) 0.25 MG tablet Take 1 tablet (0.25 mg total) by mouth at bedtime. 90 tablet 1   Vitamin D, Ergocalciferol, (DRISDOL) 1.25 MG (50000 UNIT) CAPS capsule Take one capsule by mouth weekly 12 capsule 1   zolpidem (AMBIEN) 10 MG tablet One before bed 30 tablet 5   Current Facility-Administered Medications on File Prior to Visit  Medication Dose Route Frequency Provider Last Rate Last Admin   sodium chloride flush (NS) 0.9 % injection 10 mL  10 mL Intracatheter PRN Mosher, Kelli A, PA-C   10 mL at 02/19/21 1004   Past Medical History:  Diagnosis Date   Acute postoperative respiratory insufficiency 04/17/2020   AKI (acute kidney injury) ([place]) 04/17/2020   Anemia    Anemia, unspecified    Anemia, unspecified    Anemia, unspecified    Anxiety    Arthritis    BRCA gene mutation positive    Chronic pain syndrome    Depression    Diabetes  mellitus without complication ([place])    type 2   Diabetic ulcer of toe of left foot associated with type 2 diabetes mellitus, with fat layer exposed ([place]) 03/27/2020   Gastritis    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 No longer has   Past Surgical History:  Procedure Laterality Date   APPENDECTOMY     BACK SURGERY     x 3 lower disc   BILATERAL TOTAL MASTECTOMY WITH AXILLARY LYMPH NODE DISSECTION Bilateral 09/2019   CHOLECYSTECTOMY     nephrolithiasis     ROBOTIC ASSISTED TOTAL HYSTERECTOMY WITH BILATERAL SALPINGO OOPHERECTOMY Bilateral 06/14/2016   Procedure: ROBOTIC ASSISTED TOTAL HYSTERECTOMY WITH BILATERAL SALPINGO OOPHORECTOMY;  Surgeon: Nancy Marus, MD;  Location: [facility];  Service: Gynecology;  Laterality: Bilateral;    Family History  Problem Relation Age of Onset   Cancer Mother        breast   CAD Father    Diabetes Father    Heart failure Father    Cancer Father        renal carcinoma   Kidney failure Father    Cancer Brother 1       renal carcinoma.   Stroke Paternal Grandmother    Social History   Socioeconomic History   Marital status: Divorced    Spouse name: Not on file   Number of children: Not on file   Years of education: Not on file   Highest education level: Not on file  Occupational History   Occupation: disabled  Tobacco Use   Smoking status: Never   Smokeless tobacco: Never  Substance and Sexual Activity   Alcohol use: No   Drug use: No   Sexual activity:  Yes  Other Topics Concern   Not on file  Social History Narrative   wears sunscreen, brushes and flosses daily, see's dentist bi-annually, has smoke/carbon monoxide detectors, wears a seatbelt and practices gun safety   Social Determinants of Health   Financial Resource Strain: High Risk (09/01/2021)   Overall Financial Resource Strain (CARDIA)    Difficulty of Paying Living Expenses: Hard  Food Insecurity: Food Insecurity Present (06/30/2021)   Hunger Vital Sign    Worried About Running Out of Food in the Last Year: Often true    Ran Out of Food in the Last Year: Often true  Transportation Needs: Unmet Transportation Needs (09/01/2021)   PRAPARE - Hydrologist (Medical): Yes    Lack of Transportation (Non-Medical): Yes  Physical Activity: Not on file  Stress: Stress Concern Present (07/01/2021)   [place]    Feeling of Stress : To some extent  Social Connections: Not on file    Review of Systems  Constitutional:  Positive for fatigue. Negative for chills and fever.  HENT:  Positive for voice change. Negative for congestion, ear pain and sore throat.   Respiratory:  Negative for cough and shortness of breath.   Cardiovascular:  Negative for chest pain and palpitations.  Gastrointestinal:  Positive for diarrhea and nausea. Negative for abdominal pain, constipation and vomiting.  Endocrine: Negative for polydipsia, polyphagia and polyuria.  Genitourinary:  Negative for difficulty urinating and dysuria.  Musculoskeletal:  Positive for arthralgias (bilateral knee pain and bilateral hip pain.) and back pain. Negative for myalgias.  Skin:  Positive for rash (right forearm and back).  Neurological:  Positive for headaches.  Psychiatric/Behavioral:  Negative for dysphoric mood. The patient is nervous/anxious.      Objective:  BP 100/62   Pulse 80   Temp (!) 97.3 F (36.3 C)   Resp 14   Ht 5'  2" (  1.575 m)   Wt 208 lb (94.3 kg)   LMP 06/13/2009   BMI 38.04 kg/m      01/06/2022    8:19 AM 01/06/2022    7:42 AM 12/13/2021    2:08 PM  BP/Weight  Systolic BP 893 810 175  Diastolic BP 62 60 94  Wt. (Lbs)  208 211.9  BMI  38.04 kg/m2 38.76 kg/m2    Physical Exam Vitals reviewed.  Constitutional:      Appearance: Normal appearance.  Neck:     Vascular: No carotid bruit.  Cardiovascular:     Rate and Rhythm: Normal rate and regular rhythm.     Heart sounds: Normal heart sounds.  Pulmonary:     Effort: Pulmonary effort is normal.     Breath sounds: Normal breath sounds.  Abdominal:     General: Bowel sounds are normal.     Palpations: Abdomen is soft.     Tenderness: There is no abdominal tenderness.  Skin:    General: Skin is warm.     Capillary Refill: Capillary refill takes more than 3 seconds.     Findings: Bruising, erythema (right foot big toe), lesion (hands and back) and rash (hands, fingerholes and back) present.     Comments: multiple small, erythematous excoriated papules in BL hands and back  Neurological:     Mental Status: She is alert and oriented to person, place, and time.  Psychiatric:     Comments: Anxious and depressed    Diabetic Foot Exam - Simple   Simple Foot Form Diabetic Foot exam was performed with the following findings: Yes 01/06/2022  8:54 AM  Visual Inspection No deformities, no ulcerations, no other skin breakdown bilaterally: Yes Sensation Testing See comments: Yes Pulse Check Posterior Tibialis and Dorsalis pulse intact bilaterally: Yes Comments Patient has a redness in a red big toe in the left leg Dorsalis pedis  are faint in left foot (+1) compared to right foot (+2)        Lab Results  Component Value Date   WBC 4.5 01/06/2022   HGB 11.7 01/06/2022   HCT 39.2 01/06/2022   PLT 84 (LL) 01/06/2022   GLUCOSE 195 (H) 01/06/2022   CHOL 111 01/06/2022   TRIG 120 01/06/2022   HDL 35 (L) 01/06/2022   LDLCALC 54  01/06/2022   ALT 25 01/06/2022   AST 29 01/06/2022   NA 142 01/06/2022   K 4.2 01/06/2022   CL 101 01/06/2022   CREATININE 1.05 (H) 01/06/2022   BUN 10 01/06/2022   CO2 27 01/06/2022   TSH 4.080 01/06/2022   HGBA1C 7.4 (H) 01/06/2022   MICROALBUR 30 10/09/2019      Assessment & Plan:   Problem List Items Addressed This Visit       Cardiovascular and Mediastinum   Hypertension complicating diabetes ([place])    Well controlled.  No changes to medicines. Continue Losartan potassium 50 mg take 1 tablet daily, Potassium Chloride 10 meq take 2 capsules BID Continue to work on eating a healthy diet and exercise.  Labs drawn today.        Relevant Medications   tirzepatide (MOUNJARO) 2.5 MG/0.5ML Pen   Other Relevant Orders   Comprehensive metabolic panel (Completed)   CBC with Differential/Platelet (Completed)   Cardiovascular Risk Assessment (Completed)     Digestive   Liver cirrhosis secondary to NASH (nonalcoholic steatohepatitis) (HCC) (Chronic)    Stable.  Recommend continue to work on eating healthy diet and exercise.  Endocrine   Acquired hypothyroidism    Previously well controlled Continue Synthroid at current dose  Recheck TSH and adjust Synthroid as indicated        Relevant Orders   TSH (Completed)   Diabetic glomerulopathy ([place])    Control: Not at goal Recommend check sugars fasting daily. Recommend check feet daily. Recommend annual eye exams. Medicines: Stopped Trulicity 4.5 mg injection once weekly,  Started Mounjaro 2.5 mg  Synjardy 12.5 mg/ 1000 mg 1 tablet twice daily, Tresiba inject 60 units daily. Continue to work on eating a healthy diet and exercise.  Labs drawn today.         Relevant Medications   tirzepatide (MOUNJARO) 2.5 MG/0.5ML Pen   Other Relevant Orders   Hemoglobin A1c (Completed)   Type 2 diabetes mellitus with hyperglycemia (HCC) - Primary    Control: Uncontrolled Recommend check sugars fasting daily, sugar  running between 180-400 Recommend check feet daily. Recommend annual eye exams. Medicines: Stopped trulicity to 4.5 mg. Added Mounjaro 2.5 mg once a week. Tresiba to 60 U before bed, Synjardy 12.5 mg/ 1000 mg 1 tablet twice daily Continue to work on eating a healthy diet and exercise.  Labs drawn today.         Relevant Medications   tirzepatide [facility]) 2.5 MG/0.5ML Pen     Musculoskeletal and Integument   Scabies infestation    Added Permethrin cream. Instructed to massage permethrin cream thoroughly into the skin from the neck to the soles of the feet, including areas under the fingernails and toenails  Laundry all the clothes and utensils with hot water      Relevant Medications   permethrin (NIX) 0.25 % LIQD     Other   Mixed hyperlipidemia    Well controlled.  No changes to medicines. Taking Atorvastatin 10 mg 1 tablet daily, lovaza 1 gm 2 capsules twice daily. Continue to work on eating a healthy diet and exercise.  Labs drawn today.        Relevant Orders   Lipid panel (Completed)   Vitamin D insufficiency    Check labs      Relevant Orders   VITAMIN D 25 Hydroxy (Vit-D Deficiency, Fractures) (Completed)   Depression, major, recurrent, mild ([place])    The current medical regimen is effective;  continue present plan and medications. Samples of Vraylar given  Refer to the counselling      Relevant Medications   cariprazine (VRAYLAR) 1.5 MG capsule      Follow-up:  in 4 weeks to see the effects of Mounjaro and Vraylar  An After Visit Summary was printed and given to the patient.  [name], MD [facility] [phone]

## 2022-01-05 NOTE — Assessment & Plan Note (Signed)
Check labs 

## 2022-01-05 NOTE — Assessment & Plan Note (Signed)
>>  ASSESSMENT AND PLAN FOR MODERATELY SEVERE RECURRENT MAJOR DEPRESSION (HCC) WRITTEN ON 01/06/2022  8:47 AM BY Lurline Del, FNP  The current medical regimen is effective;  continue present plan and medications. Samples of Vraylar given  Refer to the counselling

## 2022-01-05 NOTE — Assessment & Plan Note (Signed)
Previously well controlled Continue Synthroid at current dose  Recheck TSH and adjust Synthroid as indicated   

## 2022-01-06 ENCOUNTER — Ambulatory Visit (INDEPENDENT_AMBULATORY_CARE_PROVIDER_SITE_OTHER): Payer: HMO | Admitting: Family Medicine

## 2022-01-06 ENCOUNTER — Encounter: Payer: Self-pay | Admitting: Family Medicine

## 2022-01-06 VITALS — BP 100/62 | HR 80 | Temp 97.3°F | Resp 14 | Ht 62.0 in | Wt 208.0 lb

## 2022-01-06 DIAGNOSIS — K7581 Nonalcoholic steatohepatitis (NASH): Secondary | ICD-10-CM

## 2022-01-06 DIAGNOSIS — E1121 Type 2 diabetes mellitus with diabetic nephropathy: Secondary | ICD-10-CM | POA: Diagnosis not present

## 2022-01-06 DIAGNOSIS — E1159 Type 2 diabetes mellitus with other circulatory complications: Secondary | ICD-10-CM

## 2022-01-06 DIAGNOSIS — E1165 Type 2 diabetes mellitus with hyperglycemia: Secondary | ICD-10-CM | POA: Diagnosis not present

## 2022-01-06 DIAGNOSIS — B86 Scabies: Secondary | ICD-10-CM | POA: Insufficient documentation

## 2022-01-06 DIAGNOSIS — F330 Major depressive disorder, recurrent, mild: Secondary | ICD-10-CM

## 2022-01-06 DIAGNOSIS — E559 Vitamin D deficiency, unspecified: Secondary | ICD-10-CM

## 2022-01-06 DIAGNOSIS — I152 Hypertension secondary to endocrine disorders: Secondary | ICD-10-CM

## 2022-01-06 DIAGNOSIS — E039 Hypothyroidism, unspecified: Secondary | ICD-10-CM

## 2022-01-06 DIAGNOSIS — K7460 Unspecified cirrhosis of liver: Secondary | ICD-10-CM

## 2022-01-06 DIAGNOSIS — E782 Mixed hyperlipidemia: Secondary | ICD-10-CM

## 2022-01-06 DIAGNOSIS — Z794 Long term (current) use of insulin: Secondary | ICD-10-CM

## 2022-01-06 MED ORDER — CARIPRAZINE HCL 1.5 MG PO CAPS: 1.5000 mg | ORAL_CAPSULE | Freq: Every day | ORAL | 0 refills | Status: DC

## 2022-01-06 MED ORDER — TIRZEPATIDE 2.5 MG/0.5ML ~~LOC~~ SOAJ: 2.5000 mg | SUBCUTANEOUS | 0 refills | Status: DC

## 2022-01-06 MED ORDER — PERMETHRIN 0.25 % LIQD: 0 refills | Status: DC

## 2022-01-06 NOTE — Patient Instructions (Addendum)
Start on permethrin for scabies. Patients should massage permethrin cream thoroughly into the skin from the neck to the soles of the feet, including areas under the fingernails and toenails   For depression/anxiety: Start on vraylar 1.5 mg once daily.   Diabetes: Stop trulicity Start mounjaro 2.5 mg once weekly.

## 2022-01-06 NOTE — Assessment & Plan Note (Addendum)
Added Permethrin cream. Instructed to massage permethrin cream thoroughly into the skin from the neck to the soles of the feet, including areas under the fingernails and toenails  Laundry all the clothes and utensils with hot water

## 2022-01-08 LAB — COMPREHENSIVE METABOLIC PANEL
ALT: 25 IU/L (ref 0–32)
AST: 29 IU/L (ref 0–40)
Albumin/Globulin Ratio: 1.8 (ref 1.2–2.2)
Albumin: 4.4 g/dL (ref 3.8–4.9)
Alkaline Phosphatase: 105 IU/L (ref 44–121)
BUN/Creatinine Ratio: 10 (ref 9–23)
BUN: 10 mg/dL (ref 6–24)
Bilirubin Total: 0.4 mg/dL (ref 0.0–1.2)
CO2: 27 mmol/L (ref 20–29)
Calcium: 9.5 mg/dL (ref 8.7–10.2)
Chloride: 101 mmol/L (ref 96–106)
Creatinine, Ser: 1.05 mg/dL — ABNORMAL HIGH (ref 0.57–1.00)
Globulin, Total: 2.4 g/dL (ref 1.5–4.5)
Glucose: 195 mg/dL — ABNORMAL HIGH (ref 70–99)
Potassium: 4.2 mmol/L (ref 3.5–5.2)
Sodium: 142 mmol/L (ref 134–144)
Total Protein: 6.8 g/dL (ref 6.0–8.5)
eGFR: 64 mL/min/{1.73_m2} (ref >59)

## 2022-01-08 LAB — CBC WITH DIFFERENTIAL/PLATELET
Basophils Absolute: 0 10*3/uL (ref 0.0–0.2)
Basos: 1 %
EOS (ABSOLUTE): 0.1 10*3/uL (ref 0.0–0.4)
Eos: 3 %
Hematocrit: 39.2 % (ref 34.0–46.6)
Hemoglobin: 11.7 g/dL (ref 11.1–15.9)
Immature Grans (Abs): 0 10*3/uL (ref 0.0–0.1)
Immature Granulocytes: 0 %
Lymphocytes Absolute: 1.4 10*3/uL (ref 0.7–3.1)
Lymphs: 31 %
MCH: 26.5 pg — ABNORMAL LOW (ref 26.6–33.0)
MCHC: 29.8 g/dL — ABNORMAL LOW (ref 31.5–35.7)
MCV: 89 fL (ref 79–97)
Monocytes Absolute: 0.4 10*3/uL (ref 0.1–0.9)
Monocytes: 8 %
Neutrophils Absolute: 2.6 10*3/uL (ref 1.4–7.0)
Neutrophils: 57 %
Platelets: 84 10*3/uL — CL (ref 150–450)
RBC: 4.42 x10E6/uL (ref 3.77–5.28)
RDW: 14.6 % (ref 11.7–15.4)
WBC: 4.5 10*3/uL (ref 3.4–10.8)

## 2022-01-08 LAB — VITAMIN D 25 HYDROXY (VIT D DEFICIENCY, FRACTURES): Vit D, 25-Hydroxy: 63 ng/mL (ref 30.0–100.0)

## 2022-01-08 LAB — LIPID PANEL
Chol/HDL Ratio: 3.2 ratio (ref 0.0–4.4)
Cholesterol, Total: 111 mg/dL (ref 100–199)
HDL: 35 mg/dL — ABNORMAL LOW (ref >39)
LDL Chol Calc (NIH): 54 mg/dL (ref 0–99)
Triglycerides: 120 mg/dL (ref 0–149)
VLDL Cholesterol Cal: 22 mg/dL (ref 5–40)

## 2022-01-08 LAB — HEMOGLOBIN A1C
Est. average glucose Bld gHb Est-mCnc: 166 mg/dL
Hgb A1c MFr Bld: 7.4 % — ABNORMAL HIGH (ref 4.8–5.6)

## 2022-01-08 LAB — CARDIOVASCULAR RISK ASSESSMENT

## 2022-01-08 LAB — TSH: TSH: 4.08 u[IU]/mL (ref 0.450–4.500)

## 2022-01-10 ENCOUNTER — Telehealth: Payer: Self-pay

## 2022-01-10 NOTE — Chronic Care Management (AMB) (Signed)
Chronic Care Management Pharmacy Assistant   Name: Linda [name]  MRN: [id number] DOB: [birth year]  Reason for Encounter: BS reading  01-10-2022: Left VM for readings 01-11-2022:  Left VM for readings 01-12-2022: Left VM for readings  Medications: Outpatient Encounter Medications as of 01/10/2022  Medication Sig Note   albuterol (VENTOLIN HFA) 108 (90 Base) MCG/ACT inhaler Inhale 2 puffs into the lungs every 6 (six) hours as needed for wheezing or shortness of breath.    atorvastatin (LIPITOR) 10 MG tablet Take 1 tablet (10 mg total) by mouth daily.    Blood Glucose Monitoring Suppl (ONETOUCH VERIO REFLECT) w/Device KIT AS DIRECTED    Blood Pressure Monitoring (SPHYGMOMANOMETER) MISC 1 each by Does not apply route daily in the afternoon.    buPROPion (WELLBUTRIN XL) 300 MG 24 hr tablet Take 1 tablet (300 mg total) by mouth every evening.    busPIRone (BUSPAR) 5 MG tablet Take 1 tablet (5 mg total) by mouth 3 (three) times daily.    calcium citrate-vitamin D (CITRACAL+D) 315-200 MG-UNIT tablet Take 1 tablet by mouth 2 (two) times daily.    cariprazine (VRAYLAR) 1.5 MG capsule Take 1 capsule (1.5 mg total) by mouth daily.    clotrimazole-betamethasone (LOTRISONE) cream Apply twice daily as needed to area of concern    Continuous Blood Gluc Receiver (FREESTYLE LIBRE 2 READER) DEVI E11.69 Check blood sugar 4 times daily as directed    Continuous Blood Gluc Sensor (FREESTYLE LIBRE 2 SENSOR) MISC E11.69 Change sensor every 14 days as directed    cyclobenzaprine (FLEXERIL) 10 MG tablet Take one tablet by mouth every 8 hours as needed for muscle spasms    dicyclomine (BENTYL) 20 MG tablet TAKE ONE TABLET BY MOUTH BEFORE MEALS AND AT BEDTIME AS NEEDED FOR STOMACH CRAMPING    Empagliflozin-metFORMIN HCl (SYNJARDY) 12.05-998 MG TABS Take 1 tablet by mouth 2 (two) times daily.    famotidine (PEPCID) 20 MG tablet Take 1 tablet (20 mg total) by mouth 2 (two) times daily.    ferrous sulfate  325 (65 FE) MG tablet Take 325 mg by mouth every evening.    FETZIMA 80 MG CP24 TAKE ONE CAPSULE BY MOUTH EVERY EVENING    gabapentin (NEURONTIN) 300 MG capsule TAKE TWO CAPSULES BY MOUTH three times daily    glucose blood (ONETOUCH VERIO) test strip Use as instructed    insulin degludec (TRESIBA FLEXTOUCH) 200 UNIT/ML FlexTouch Pen Inject 52 Units into the skin daily.    levothyroxine (SYNTHROID) 75 MCG tablet Take 1 tablet (75 mcg total) by mouth daily.    lisdexamfetamine (VYVANSE) 70 MG capsule Take 1 capsule (70 mg total) by mouth daily.    losartan (COZAAR) 100 MG tablet Take 1 tablet (100 mg total) by mouth daily.    Magnesium 500 MG CAPS 500 mg.    morphine (MS CONTIN) 30 MG 12 hr tablet Take 1 tablet (30 mg total) by mouth every 12 (twelve) hours.    Multiple Vitamin (MULTIVITAMIN WITH MINERALS) TABS tablet Take 1 tablet by mouth daily.  06/08/2018: Pt plans on purchasing again when she can get out.   mupirocin ointment (BACTROBAN) 2 % Apply topically 2 (two) times daily.    omega-3 acid ethyl esters (LOVAZA) 1 g capsule Take 2 capsules (2 g total) by mouth 2 (two) times daily.    ondansetron (ZOFRAN) 4 MG tablet Take 1 tablet (4 mg total) by mouth every 4 (four) hours as needed for nausea.    OneTouch Delica  Lancets 30G MISC 1 each by Does not apply route daily before breakfast. Check blood sugar twice daily.    pantoprazole (PROTONIX) 40 MG tablet Take 1 tablet (40 mg total) by mouth 2 (two) times daily.    permethrin (NIX) 0.25 % LIQD Patients should massage permethrin cream thoroughly into the skin from the neck to the soles of the feet, including areas under the fingernails and toenails.    potassium chloride (MICRO-K) 10 MEQ CR capsule Take 2 capsules (20 mEq total) by mouth 2 (two) times daily.    prochlorperazine (COMPAZINE) 10 MG tablet Take 10 mg by mouth every 6 (six) hours as needed.    rOPINIRole (REQUIP) 0.25 MG tablet Take 1 tablet (0.25 mg total) by mouth at bedtime.     tirzepatide [facility]) 2.5 MG/0.5ML Pen Inject 2.5 mg into the skin once a week.    Vitamin D, Ergocalciferol, (DRISDOL) 1.25 MG (50000 UNIT) CAPS capsule Take one capsule by mouth weekly    zolpidem (AMBIEN) 10 MG tablet One before bed    Facility-Administered Encounter Medications as of 01/10/2022  Medication   sodium chloride flush (NS) 0.9 % injection 10 mL     [place] Clinical Pharmacist Assistant [phone]

## 2022-01-14 ENCOUNTER — Telehealth: Payer: Self-pay

## 2022-01-14 ENCOUNTER — Other Ambulatory Visit: Payer: Self-pay | Admitting: Physician Assistant

## 2022-01-14 ENCOUNTER — Other Ambulatory Visit: Payer: Self-pay | Admitting: Family Medicine

## 2022-01-14 DIAGNOSIS — Z1509 Genetic susceptibility to other malignant neoplasm: Secondary | ICD-10-CM

## 2022-01-14 DIAGNOSIS — F5081 Binge eating disorder: Secondary | ICD-10-CM

## 2022-01-14 NOTE — Progress Notes (Signed)
Chronic Care Management Pharmacy Assistant   Name: Kyrianna Barletta  MRN: [id number] DOB: April 05, 1970   Reason for Encounter: Medication Coordination for [facility]    Recent office visits:  01/06/22 [name],Kirsten MD. Seen for routine visit. Started on Cariprazine HCI 1.88m and Permethrin 0.25% and Tirzepatide 2.580m D/C Dulaglutide 4.78m48m Recent consult visits:  12/21/21 (FiSanjuan DameomDelight Hoh. Seen for scabies infestation. Ordered Zyrtec 65m72mermethrin 5% and Cephalexin 500mg30m11/20/23 (Oncology) McCarHosie PoissonSeen for follow up. No med changes.  Hospital visits:  None  Medications: Outpatient Encounter Medications as of 01/14/2022  Medication Sig Note   albuterol (VENTOLIN HFA) 108 (90 Base) MCG/ACT inhaler Inhale 2 puffs into the lungs every 6 (six) hours as needed for wheezing or shortness of breath.    atorvastatin (LIPITOR) 10 MG tablet Take 1 tablet (10 mg total) by mouth daily.    Blood Glucose Monitoring Suppl (ONETOUCH VERIO REFLECT) w/Device KIT AS DIRECTED    Blood Pressure Monitoring (SPHYGMOMANOMETER) MISC 1 each by Does not apply route daily in the afternoon.    buPROPion (WELLBUTRIN XL) 300 MG 24 hr tablet Take 1 tablet (300 mg total) by mouth every evening.    busPIRone (BUSPAR) 5 MG tablet Take 1 tablet (5 mg total) by mouth 3 (three) times daily.    calcium citrate-vitamin D (CITRACAL+D) 315-200 MG-UNIT tablet Take 1 tablet by mouth 2 (two) times daily.    cariprazine (VRAYLAR) 1.5 MG capsule Take 1 capsule (1.5 mg total) by mouth daily.    clotrimazole-betamethasone (LOTRISONE) cream Apply twice daily as needed to area of concern    Continuous Blood Gluc Receiver (FREESTYLE LIBRE 2 READER) DEVI E11.69 Check blood sugar 4 times daily as directed    Continuous Blood Gluc Sensor (FREESTYLE LIBRE 2 SENSOR) MISC E11.69 Change sensor every 14 days as directed    cyclobenzaprine (FLEXERIL) 10 MG tablet Take one tablet by mouth every 8  hours as needed for muscle spasms    dicyclomine (BENTYL) 20 MG tablet TAKE ONE TABLET BY MOUTH BEFORE MEALS AND AT BEDTIME AS NEEDED FOR STOMACH CRAMPING    Empagliflozin-metFORMIN HCl (SYNJARDY) 12.05-998 MG TABS Take 1 tablet by mouth 2 (two) times daily.    famotidine (PEPCID) 20 MG tablet Take 1 tablet (20 mg total) by mouth 2 (two) times daily.    ferrous sulfate 325 (65 FE) MG tablet Take 325 mg by mouth every evening.    FETZIMA 80 MG CP24 TAKE ONE CAPSULE BY MOUTH EVERY EVENING    gabapentin (NEURONTIN) 300 MG capsule TAKE TWO CAPSULES BY MOUTH three times daily    glucose blood (ONETOUCH VERIO) test strip Use as instructed    insulin degludec (TRESIBA FLEXTOUCH) 200 UNIT/ML FlexTouch Pen Inject 52 Units into the skin daily.    levothyroxine (SYNTHROID) 75 MCG tablet Take 1 tablet (75 mcg total) by mouth daily.    lisdexamfetamine (VYVANSE) 70 MG capsule Take 1 capsule (70 mg total) by mouth daily.    losartan (COZAAR) 100 MG tablet Take 1 tablet (100 mg total) by mouth daily.    Magnesium 500 MG CAPS 500 mg.    morphine (MS CONTIN) 30 MG 12 hr tablet Take 1 tablet (30 mg total) by mouth every 12 (twelve) hours.    Multiple Vitamin (MULTIVITAMIN WITH MINERALS) TABS tablet Take 1 tablet by mouth daily.  06/08/2018: Pt plans on purchasing again when she can get out.   mupirocin ointment (BACTROBAN) 2 % Apply topically 2 (  two) times daily.    omega-3 acid ethyl esters (LOVAZA) 1 g capsule Take 2 capsules (2 g total) by mouth 2 (two) times daily.    ondansetron (ZOFRAN) 4 MG tablet Take 1 tablet (4 mg total) by mouth every 4 (four) hours as needed for nausea.    OneTouch Delica Lancets 45X MISC 1 each by Does not apply route daily before breakfast. Check blood sugar twice daily.    pantoprazole (PROTONIX) 40 MG tablet Take 1 tablet (40 mg total) by mouth 2 (two) times daily.    permethrin (NIX) 0.25 % LIQD Patients should massage permethrin cream thoroughly into the skin from the neck to the  soles of the feet, including areas under the fingernails and toenails.    potassium chloride (MICRO-K) 10 MEQ CR capsule Take 2 capsules (20 mEq total) by mouth 2 (two) times daily.    prochlorperazine (COMPAZINE) 10 MG tablet Take 10 mg by mouth every 6 (six) hours as needed.    rOPINIRole (REQUIP) 0.25 MG tablet Take 1 tablet (0.25 mg total) by mouth at bedtime.    tirzepatide [facility]) 2.5 MG/0.5ML Pen Inject 2.5 mg into the skin once a week.    Vitamin D, Ergocalciferol, (DRISDOL) 1.25 MG (50000 UNIT) CAPS capsule Take one capsule by mouth weekly    zolpidem (AMBIEN) 10 MG tablet One before bed    Facility-Administered Encounter Medications as of 01/14/2022  Medication   sodium chloride flush (NS) 0.9 % injection 10 mL    Reviewed chart for medication changes ahead of medication coordination call.  No  hospital visits since last care coordination call/Pharmacist visit.    BP Readings from Last 3 Encounters:  01/06/22 100/62  12/13/21 (!) 143/94  10/28/21 120/80    Lab Results  Component Value Date   HGBA1C 7.4 (H) 01/06/2022     Patient obtains medications through Adherence Packaging  30 Days   Last adherence delivery included: Tresiba Flex Inj 200u- inject 52 units daily      Atorvastatin 69m 1 EM Synjardy 12.5-1000mg 1 B 1 EM Famotidine 255m1 B 1 EM Lostartan Pot 10043m EM Bupropion HCI XL 300m70mB Levothyroxine 75mc15mBB Pantoprazole 40mg 33m1 EM Zolpidem 10mg 138mPotassium Chloride ER 10MEQ 2 B 2 EM Fetzima ER 80mg  169mMorphine Sulfate ER 30 mg 1 tab every 12 hours (Bottle)       Buspirone 5mg 1 B 54m1 EM Vyvanse 70mg 1 B 11minirole 0.25mg 1 BT 88mza 1gm 2 B 2 EM Gabapentin 300mg 2 caps90m three times daily (Bottle)      Trulicity 4.5 inject 4.5mg once w6.4WO             Vitamin D2 1250mcg 1 B on32mdays Freestyle Libre 2 sensors change every 14 days       Prochlorperazine 10mg 1 tab ev35m6 hours as needed (Bottle)        Cyclobenzaprine 10mg  1 tab ev7m8 hours as needed (Bottle) Onetouch Delica Lancets 30g Onetouch Ve03O Test Strips   Patient declined (meds) last month  Unable to reach pt  Patient is due for next adherence delivery on: 01/25/22. Called patient and reviewed medications and coordinated delivery.  This delivery to include: Tresiba Flex Inj 200u- inject 52 units daily      Atorvastatin 10mg 1 EM Synja73m12.5-1000mg 1 B 1 EM Famotidine 20mg 1 B 1 EM Lo67mtan Pot 100mg 1 EM Bupropi75mCI XL 300mg59m  1 B Levothyroxine 37mg 1 BB Pantoprazole 426m1 B 1 EM Zolpidem 1077m BT Potassium Chloride ER 10MEQ 2 B 2 EM Fetzima ER 72m3m EM Morphine Sulfate ER 30 mg 1 tab every 12 hours (Bottle)       Buspirone 5mg 59m 1 L1 EM Vyvanse 70mg 91m Ropinirole 0.25mg 172mLovaza 1gm 2 B 2 EM Gabapentin 300mg 2 30mules three times daily (Bottle)      Trulicity 4.5 inject 4.5mg on1.3YQekly             Vitamin D2 1250mcg 1 65m Fridays Freestyle Libre 2 sensors change every 14 days       Prochlorperazine 10mg 1 ta71mery 6 hours as needed (Bottle)        Cyclobenzaprine 10mg 1 tab58mry 8 hours as needed (Bottle) Onetouch Delica Lancets 30g Onetouc65Herio Test Strips   Patient declined the following medications- Unable to get in touch with pt to ask about these medications  Albuterol Inhaler Cariprazine 1.5mg- Was gi25m samples  Dicyclomine 20mg Magnesi54m00mg Ferrous 49mate 325mg Tirzepati92m.5mg- Was given 98mples  Trulicity 4.5- This was D/C, started on Mounjaro 2.5mg  Patient nee58mrefills-Request Sent Potassium Chloride ER 10MEQ Onetouch De84ONGLancets 30g Gabapentin 3029B Vyvanse 70mg53mrphine Sul52m ER 30 mg Buspirone 5mg  Tresiba Flex I76m200u  Unable to get in contact with pt to confirm delivery    Danielle Gerringer, Elray McgregoracSmithfieldt  [phone]

## 2022-01-15 NOTE — Assessment & Plan Note (Addendum)
Stable.  Recommend continue to work on eating healthy diet and exercise.

## 2022-01-15 NOTE — Assessment & Plan Note (Signed)
Well controlled.  No changes to medicines. Continue Losartan potassium 50 mg take 1 tablet daily, Potassium Chloride 10 meq take 2 capsules BID Continue to work on eating a healthy diet and exercise.  Labs drawn today.

## 2022-01-19 ENCOUNTER — Ambulatory Visit (INDEPENDENT_AMBULATORY_CARE_PROVIDER_SITE_OTHER): Payer: HMO

## 2022-01-19 ENCOUNTER — Other Ambulatory Visit: Payer: Self-pay

## 2022-01-19 DIAGNOSIS — E039 Hypothyroidism, unspecified: Secondary | ICD-10-CM

## 2022-01-19 DIAGNOSIS — E1165 Type 2 diabetes mellitus with hyperglycemia: Secondary | ICD-10-CM

## 2022-01-19 DIAGNOSIS — F5081 Binge eating disorder: Secondary | ICD-10-CM

## 2022-01-19 DIAGNOSIS — E1159 Type 2 diabetes mellitus with other circulatory complications: Secondary | ICD-10-CM

## 2022-01-19 DIAGNOSIS — Z1501 Genetic susceptibility to malignant neoplasm of breast: Secondary | ICD-10-CM

## 2022-01-19 MED ORDER — GABAPENTIN 300 MG PO CAPS: 600.0000 mg | ORAL_CAPSULE | Freq: Three times a day (TID) | ORAL | 3 refills | Status: DC

## 2022-01-19 MED ORDER — MORPHINE SULFATE ER 30 MG PO TBCR: 30.0000 mg | EXTENDED_RELEASE_TABLET | Freq: Two times a day (BID) | ORAL | 0 refills | Status: DC

## 2022-01-19 MED ORDER — BUSPIRONE HCL 5 MG PO TABS: 5.0000 mg | ORAL_TABLET | Freq: Three times a day (TID) | ORAL | 3 refills | Status: DC

## 2022-01-19 MED ORDER — POTASSIUM CHLORIDE ER 10 MEQ PO CPCR: 20.0000 meq | ORAL_CAPSULE | Freq: Two times a day (BID) | ORAL | 3 refills | Status: DC

## 2022-01-19 MED ORDER — LISDEXAMFETAMINE DIMESYLATE 70 MG PO CAPS: 70.0000 mg | ORAL_CAPSULE | Freq: Every morning | ORAL | 0 refills | Status: DC

## 2022-01-19 MED ORDER — ONETOUCH DELICA PLUS LANCET30G MISC: 3 refills | Status: DC

## 2022-01-19 MED ORDER — TRESIBA FLEXTOUCH 200 UNIT/ML ~~LOC~~ SOPN: 52.0000 [IU] | PEN_INJECTOR | Freq: Every day | SUBCUTANEOUS | 3 refills | Status: DC

## 2022-01-19 NOTE — Progress Notes (Signed)
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 stated cream) delivered ASAP. PCP is aware of patient's mental state, has f/u in 2 weeks to re-assess   Patient Goals/Self-Care Activities Over the next 90 days, patient will:  - take medications as prescribed check glucose three times daily , document, and provide at future appointments  Follow Up Plan: Telephone follow up appointment with care management team member scheduled for: Feb 2024  Arizona Constable, [place].D. - [phone]        Medication Assistance: None required.  Patient affirms current coverage meets needs.  Patient's preferred pharmacy  is:  [facility] - [place], [place] - [address] [place] [address] Phone: [phone] Fax: [phone]  [facility] (New Address) - [place], [place] AT Previously: [address], [place] [address] [place] [address] Phone: [phone] Fax: [place], [place] - [place] [address] [address] [place] [address] Phone: [phone] Fax: [phone]    Uses pill box? Yes Pt endorses good compliance  We discussed: Benefits of medication synchronization, packaging and delivery as well as enhanced pharmacist oversight with [facility]. Patient decided to: Utilize [facility] for medication synchronization, packaging and delivery  Care Plan and Follow Up Patient Decision:  Patient agrees to Care Plan and Follow-up.  Plan: Telephone follow up appointment with care management team member scheduled for:  Feb 2024  [place] Constable, [place].D. - [phone]

## 2022-01-19 NOTE — Telephone Encounter (Signed)
-----   Message from Eulis Canner sent at 01/14/2022 10:14 AM EST ----- Regarding: Refills Good morning,   We need refills sent into [facility] for her delivery coming up. Can you please send these in?  Thank you!  Potassium Chloride ER 41CVU Onetouch Delica Lancets 13H Gabapentin 3266m Vyvanse 752mMorphine Sulfate ER 30 mg Buspirone 66m33mTresiba Flex Inj 200u

## 2022-01-19 NOTE — Progress Notes (Signed)
01-19-2022: Attempted to contact patient for delivery. Was instructed to send delivery form to [facility] if patient doesn't answer.   [place] Pharmacist Assistant [phone]

## 2022-01-19 NOTE — Patient Instructions (Signed)
Visit Information   Goals Addressed   None    Patient Care Plan: CCM Pharmacy Care Plan     Problem Identified: diabetes, hyperlipidemia, hypertension   Priority: High  Onset Date: 03/05/2020     Long-Range Goal: Disease Management   Start Date: 03/05/2020  Expected End Date: 03/05/2021  Recent Progress: On track  Priority: High  Note:   Current Barriers:  Unable to maintain control of blood sugar while on steroids/chemotherapy.    Pharmacist Clinical Goal(s):  Over the next 90 days, patient will achieve control of diabetes as evidenced by blood sugar and a1c through collaboration with PharmD and provider.   Interventions: 1:1 collaboration with [name], Elnita Maxwell, MD regarding development and update of comprehensive plan of care as evidenced by provider attestation and co-signature Inter-disciplinary care team collaboration (see longitudinal plan of care) Comprehensive medication review performed; medication list updated in electronic medical record  Hypertension (BP goal <130/80) BP Readings from Last 3 Encounters:  01/06/22 100/62  12/13/21 (!) 143/94  10/28/21 120/80  -controlled -Current treatment: losartan 100 mg daily Appropriate, Effective, Safe, Accessible -Medications previously tried: none reported  -Current home readings:  August 2023: 119/84 133/82 121/74 124/78 December 2023: No readings available -Current dietary habits: eating ice cream every night -Current exercise habits: none -Denies hypotensive/hypertensive symptoms -Educated on BP goals and benefits of medications for prevention of heart attack, stroke and kidney damage; Daily salt intake goal < 2300 mg; -Counseled to monitor BP at home as needed, document, and provide log at future appointments -Counseled on diet and exercise extensively Recommended to continue current medication  Hyperlipidemia: (LDL goal < 70) The ASCVD Risk score (Arnett DK, et al., 2019) failed to calculate for the following  reasons:   The valid total cholesterol range is 130 to 320 mg/dL Lab Results  Component Value Date   CHOL 111 01/06/2022   CHOL 155 10/01/2021   CHOL 145 06/23/2021   Lab Results  Component Value Date   HDL 35 (L) 01/06/2022   HDL 48 10/01/2021   HDL 50 06/23/2021   Lab Results  Component Value Date   LDLCALC 54 01/06/2022   LDLCALC 80 10/01/2021   LDLCALC 69 06/23/2021   Lab Results  Component Value Date   TRIG 120 01/06/2022   TRIG 159 (H) 10/01/2021   TRIG 150 (H) 06/23/2021   Lab Results  Component Value Date   CHOLHDL 3.2 01/06/2022   CHOLHDL 3.2 10/01/2021   CHOLHDL 2.9 06/23/2021  No results found for: "LDLDIRECT" -uncontrolled  -Current treatment: Atorvastatin 2m Appropriate, Effective, Safe, Accessible -Medications previously tried: Pravastatin -Current dietary patterns: eating what family provides during chemotherapy -Current exercise habits: limited due to chemo -Educated on Cholesterol goals;  Importance of limiting foods high in cholesterol; Exercise goal of 150 minutes per week; Recommended to continue current medication  Diabetes (A1c goal <7%) Lab Results  Component Value Date   HGBA1C 7.4 (H) 01/06/2022   HGBA1C 9.6 (H) 10/01/2021   HGBA1C 7.6 (H) 06/23/2021   Lab Results  Component Value Date   MICROALBUR 30 10/09/2019   LDLCALC 54 01/06/2022   CREATININE 1.05 (H) 01/06/2022   Lab Results  Component Value Date   NA 142 01/06/2022   K 4.2 01/06/2022   CREATININE 1.05 (H) 01/06/2022   EGFR 64 01/06/2022   GFRNONAA >60 11/27/2019   GLUCOSE 195 (H) 01/06/2022   Lab Results  Component Value Date   WBC 4.5 01/06/2022   HGB 11.7 01/06/2022   HCT 39.2  01/06/2022   MCV 89 01/06/2022   PLT 84 (LL) 01/06/2022   Wt Readings from Last 3 Encounters:  01/06/22 208 lb (94.3 kg)  12/13/21 211 lb 14.4 oz (96.1 kg)  10/28/21 211 lb (95.7 kg)  -Controlled -Current medications: Tirzeparatide 2.59m Appropriate, Effective, Safe,  Accessible Empagliflozin/Metformin 12.5/1000mg BID Appropriate, Query effective, Safe, Accessible Tresiba 52 units Appropriate, Query effective, Safe, Accessible FColgate-Palmolive(4x/day) Appropriate, Query effective, Safe, Accessible -Medications previously tried: none reported  -Current home glucose readings fasting glucose:  May 2023: 06-09-2021 251, 259, 271, 196, 200, 198 June 2023: Didn't have readings on her August 2023:  250. 200, 350,  December 2023: No readings available post prandial glucose: -Reports hypoglycemic symptoms -Current meal patterns:  Patient has food insecurity, unable to get food unless it's at a food bank. Even there, you can't choose much.  -Current exercise: limited due to chemotherapy -Educated onA1c and blood sugar goals; Exercise goal of 150 minutes per week; Benefits of routine self-monitoring of blood sugar; -Counseled to check feet daily and get yearly eye exams -Counseled on diet and exercise extensively  May 2023:  -Patient has food insecurity, will ask PCP if we can do referral for social worker -Patient took DM class many years ago but she has difficulty making healthy meals out of what the food banks give her. She'd like to meet with a dietician/take a class. Will ask for referral -Patient used to qualify for Medicaid, but when her father passed and left her some money, she didn't qualify anymore. She believes she is close to qualifying again. She will call Medicaid as soon as she hangs up with me and try to apply/find out when she qualifies -Patient states she has issues with binge eating. She will wake up in the middle of the night and binge eat. Spent extensive time counseling that the key to DM isn't "not eating" but it's eating consistently. Her "homework" for the next month will be to try to eat consistently and not skip meals. She is unable to focus on healthy food due to cost, so we'll focus on consistency until the social worker can help  out June 2023: Patient states she isn't eating as much at night. States it's hard to eat consistently during the day because "Vyvanse" prevents the urge. Had appt with Dietician this week but had to cancel due to cold. Rescheduled until next month. LCSW reaching out this afternoon August 2023: Patient missed dietician appt again (She is still sick). States she's eating ice cream every night. Recommend adding short term insulin (Patient at max dose of long term insulin) December 2023: Patient went to ER last night due to a "Bug" infestation. 2 weeks ago, patient had an "itchy" feeling on her arm. Seeing dark places on carpet, believes she has bugs. Called exterminator and he said she has "[employer]" Dr. at ER said he has never heard of anything like that and patient states doctor was very rude/dismissive and she felt he thought she was crazy. She was hesitant to tell me this story because she didn't want me to think she was crazy too. I asked her to tell me what happened and expressed empathy. After further digging, I discovered she never took the medicine PCP prescribed because patient thought it was carpet beetles, not Scabies. Coordinated with CCM team to get Permethrin Cream (Epic says liquid but pharmacy stated cream) delivered ASAP. Will go over sugars next f/u  Depression/Anxiety (Goal: manage symptoms) -uncontrolled -Current treatment: Wellbutrin XL  300 mg daily Appropriate, Effective, Safe, Accessible Fetzima 80 mg Appropriate, Effective, Safe, Accessible Lorazepam 0.5 mg bid prn anxiety Appropriate, Effective, Safe, Accessible Vyvanse 19m QD Appropriate, Effective, Safe, Accessible -Medications previously tried/failed: none reported -PHQ9:     01/06/2022    7:59 AM 07/01/2021    2:19 PM 06/23/2021    7:40 AM  Depression screen PHQ 2/9  Decreased Interest 1 3 0  Down, Depressed, Hopeless 2 1 0  PHQ - 2 Score 3 4 0  Altered sleeping 3 3   Tired, decreased energy 3 3   Change in  appetite 3 3   Feeling bad or failure about yourself  3 3   Trouble concentrating 3 3   Moving slowly or fidgety/restless 2 2   Suicidal thoughts 1 0   PHQ-9 Score 21 21   Difficult doing work/chores Very difficult Somewhat difficult   -GAD7:     01/06/2022    7:49 AM 07/21/2020   11:40 AM 03/09/2020    9:52 AM  GAD 7 : Generalized Anxiety Score  Nervous, Anxious, on Edge _0 Control/stop worrying 0 2 3  Worry too much - different things _1 Trouble relaxing _2 Restless _3 Easily annoyed or irritable _4 Afraid - awful might happen _5 Total GAD 7 Score _6 -Educated on Benefits of cognitive-behavioral therapy with or without medication December 2023:  Patient went to ER last night due to a "Bug" infestation. 2 weeks ago, patient had an "itchy" feeling on her arm. Seeing dark places on carpet, believes she has bugs. Called exterminator and he said she has "[employer]" Dr. at ER said he has never heard of anything like that and patient states doctor was very rude/dismissive and she felt he thought she was crazy. She was hesitant to tell me this story because she didn't want me to think she was crazy too. I asked her to tell me what happened and expressed empathy. After further digging, I discovered she never took the medicine PCP prescribed because patient thought it was carpet beetles, not Scabies. Coordinated with CCM team to get Permethrin Cream (Epic says liquid but pharmacy stated cream) delivered ASAP. PCP is aware of patient's mental state, has f/u in 2 weeks to re-assess   Patient Goals/Self-Care Activities Over the next 90 days, patient will:  - take medications as prescribed check glucose three times daily , document, and provide at future appointments  Follow Up Plan: Telephone follow up appointment with care management team member scheduled for: Feb 2024  NArizona Constable PFloridaD. - [phone]     Patient Care Plan: LCSW Plan of Care      Problem Identified: Symptoms (Depression)      Long-Range Goal: Reduce symptoms of depression and connect with community/activities to improve QOL Completed 08/12/2021  Start Date: 07/01/2021  Expected End Date: 09/22/2021  Recent Progress: On track  Priority: High  Note:   Current Barriers:  Limited social support, Transportation, Limited access to food, Mental Health Concerns , Social Isolation, and Lacks knowledge of community resource:     CSW Clinical Goal(s):  Patient  will explore community resource options for unmet needs related to:  Transportation, FSales promotion account executive, FLandscape architect, Depression  , and Social Connections through collaboration with CHoliday representative provider, and care team.   Interventions: CSW made contact with pt today who has been sick  with bronchitis like symptoms- "been on antibiotics and then a steroid shot". She has not felt up to looking into the  Pt reports her depression is minimal- has a [age] next week!  Pt still planning to consider "silver sneakers" program or similar program at the "[facility]".  Pt advised of plans to follow up with her via Care Coordination in 6 weeks.       07/01/2021    2:19 PM 06/23/2021    7:40 AM 03/23/2021    8:36 AM 01/20/2021    7:59 AM 11/16/2020   10:49 AM  Depression screen PHQ 2/9  Decreased Interest 3 0 0 0 2  Down, Depressed, Hopeless 1 0 0 1 1  PHQ - 2 Score 4 0 0 1 3  Altered sleeping 3  1 0 1  Tired, decreased energy _0 0  Change in appetite 3  1 0 3  Feeling bad or failure about yourself  3  0 1 1  Trouble concentrating 3  0 1 2  Moving slowly or fidgety/restless 2  0 0 3  Suicidal thoughts 0  0 0 0  PHQ-9 Score _1 Difficult doing work/chores Somewhat difficult  Not difficult at all Not difficult at all Somewhat difficult     1:1 collaboration with primary care provider regarding development and update of comprehensive plan of care as evidenced by provider attestation and  co-signature Inter-disciplinary care team collaboration (see longitudinal plan of care) Evaluation of current treatment plan related to  self management and patient's adherence to plan as established by provider Review resources, discussed options and provided patient information about  Community food options  Referral to care guide ( )    Mental Health:  (Status: New goal.) Evaluation of current treatment plan related to Depression: depressed mood anxiety hopelessness loss of energy/fatigue anhedonia insomnia disturbed sleep increased appetite and Eating Disorder: "binge eating" Depression screen reviewed  PHQ2/ PHQ9 completed Solution-Focused Strategies employed:  Mindfulness or Relaxation training provided Active listening / Reflection utilized  Emotional Support Provided Problem Solving /Task Center strategies reviewed Provided psychoeducation for mental health needs  Reviewed mental health medications and discussed importance of compliance:    Task & activities to accomplish goals: -call [facility] to schedule visit -review info emailed to you about the HTA benefits you have (dental, etc)  - keep 90 percent of counseling appointments - schedule counseling appointment  -review resource material mailed/emailed to you and consider connecting            Ms. Oyer was given information about Chronic Care Management services today including:  CCM service includes personalized support from designated clinical staff supervised by her physician, including individualized plan of care and coordination with other care providers 24/7 contact phone numbers for assistance for urgent and routine care needs. Standard insurance, coinsurance, copays and deductibles apply for chronic care management only during months in which we provide at least 20 minutes of these services. Most insurances cover these services at 100%, however patients may be responsible for any  copay, coinsurance and/or deductible if applicable. This service may help you avoid the need for more expensive face-to-face services. Only one practitioner may furnish and bill the service in a calendar month. The patient may stop CCM services at any time (effective at the end of the month) by phone call to the office staff.  Patient agreed to services and verbal consent obtained.   The patient verbalized understanding  of instructions, educational materials, and care plan provided today and DECLINED offer to receive copy of patient instructions, educational materials, and care plan.  The pharmacy team will reach out to the patient again over the next 60 days.   Lane Hacker, [place]

## 2022-01-23 ENCOUNTER — Other Ambulatory Visit: Payer: Self-pay | Admitting: Family Medicine

## 2022-01-23 DIAGNOSIS — E785 Hyperlipidemia, unspecified: Secondary | ICD-10-CM | POA: Diagnosis not present

## 2022-01-23 DIAGNOSIS — E1159 Type 2 diabetes mellitus with other circulatory complications: Secondary | ICD-10-CM

## 2022-01-23 DIAGNOSIS — I10 Essential (primary) hypertension: Secondary | ICD-10-CM | POA: Diagnosis not present

## 2022-01-23 DIAGNOSIS — Z794 Long term (current) use of insulin: Secondary | ICD-10-CM

## 2022-01-23 DIAGNOSIS — E1169 Type 2 diabetes mellitus with other specified complication: Secondary | ICD-10-CM

## 2022-01-26 ENCOUNTER — Telehealth: Payer: Self-pay

## 2022-01-26 NOTE — Progress Notes (Cosign Needed Addendum)
error 

## 2022-01-27 ENCOUNTER — Telehealth: Payer: Self-pay

## 2022-01-27 ENCOUNTER — Encounter: Payer: Self-pay | Admitting: Family Medicine

## 2022-01-27 NOTE — Telephone Encounter (Signed)
Patient called reporting "sores" on toes from beetles that have came into her apartment.  She states that they she wakes up on the morning with "bloody socks".  She has taken pictures and has agreed to send them via MyChart.  She has been cleaning them daily and denies s/s of infection at this time.

## 2022-02-02 ENCOUNTER — Telehealth: Payer: HMO

## 2022-02-02 ENCOUNTER — Telehealth: Payer: Self-pay

## 2022-02-02 ENCOUNTER — Ambulatory Visit (INDEPENDENT_AMBULATORY_CARE_PROVIDER_SITE_OTHER): Payer: PPO

## 2022-02-02 ENCOUNTER — Telehealth: Payer: Self-pay | Admitting: *Deleted

## 2022-02-02 DIAGNOSIS — E1169 Type 2 diabetes mellitus with other specified complication: Secondary | ICD-10-CM

## 2022-02-02 DIAGNOSIS — E1159 Type 2 diabetes mellitus with other circulatory complications: Secondary | ICD-10-CM

## 2022-02-02 DIAGNOSIS — E782 Mixed hyperlipidemia: Secondary | ICD-10-CM

## 2022-02-02 DIAGNOSIS — F330 Major depressive disorder, recurrent, mild: Secondary | ICD-10-CM

## 2022-02-02 DIAGNOSIS — Z794 Long term (current) use of insulin: Secondary | ICD-10-CM

## 2022-02-02 NOTE — Progress Notes (Signed)
  Care Coordination   Note   02/02/2022 Name: Phylliss Strege MRN: [id number] DOB: 04-Sep-1970  Noelani Harbach is a 52 y.o. year old female who sees [name], Elnita Maxwell, MD for primary care. I reached out to [name] by phone today to offer care coordination services.  Ms. Beirne was given information about Care Coordination services today including:   The Care Coordination services include support from the care team which includes your Nurse Coordinator, Clinical Social Worker, or Pharmacist.  The Care Coordination team is here to help remove barriers to the health concerns and goals most important to you. Care Coordination services are voluntary, and the patient may decline or stop services at any time by request to their care team member.   Care Coordination Consent Status: Patient agreed to services and verbal consent obtained.   Follow up plan:  Telephone appointment with care coordination team member scheduled for:  02/07/2022  Encounter Outcome:  Pt. Scheduled  Julian Hy, [place] Direct Dial: [phone]

## 2022-02-02 NOTE — Chronic Care Management (AMB) (Signed)
Chronic Care Management   CCM RN Visit Note  02/02/2022 Name: Linda [name] MRN: [id number] DOB: Nov 07, 1970  Subjective: Linda [name] is a 52 y.o. year old female who is a primary care patient of [name], Kirsten, MD. The patient was referred to the Chronic Care Management team for assistance with care management needs subsequent to provider initiation of CCM services and plan of care.    Today's Visit:  Engaged with patient by telephone for initial visit.     SDOH Interventions Today    Flowsheet Row Most Recent Value  SDOH Interventions   Food Insecurity Interventions Other (Comment)  [Referral to care guides for assistant with expressed needs]  Housing Interventions Intervention Not Indicated  Transportation Interventions Intervention Not Indicated  Utilities Interventions Intervention Not Indicated  Alcohol Usage Interventions Intervention Not Indicated (Score <7)  Financial Strain Interventions Other (Comment)  [care guide referral pending for assistance with resources, also works with pharm D]  Physical Activity Interventions Other (Comments)  [no structured activity, encouraged activity]  Stress Interventions Provide Counseling, Other (Comment)  Manufacturing engineer referral pending, works with outside social work, resources given]  Social Connections Interventions Other (Comment)  [the patient has friends at her apartment that she talks to daily, her step mom is 70 years old and no longer drives, education and support provided.]         Goals Addressed             This Visit's Progress    CCM Expected Outcome:  Monitor, Self-Manage and Reduce Symptoms of Diabetes       Current Barriers:  Knowledge Deficits related to concerns about places on her toes and not being able to be seen by podiatrist until next week, the patient expressed concern over being diabetic and not wanting to lose her toes Care Coordination needs related to resources in the area to help with  her expressed needs and caring for her self due to lack of support system in a patient with DM Chronic Disease Management support and education needs related to effective management of DM Lacks caregiver support.  Lab Results  Component Value Date   HGBA1C 7.4 (H) 01/06/2022     Planned Interventions: Provided education to patient about basic DM disease process. The patient states that she is concerned about her toes and states she has a "bug" coming out of her toe. She feels nobody believes her. She is concerned as she feels she needs to be seen immediately. The patient was tearful but then she talked opening about her concerns. Does have a follow up with the pcp on 02-04-2022 and podiatry on 02-10-2022. She was considering going to [facility] to see if she could be seen today. Education and support given. Reflective listening; Reviewed medications with patient and discussed importance of medication adherence;        Reviewed prescribed diet with patient heart healthy/ADA diet ; Counseled on importance of regular laboratory monitoring as prescribed;        Discussed plans with patient for ongoing care management follow up and provided patient with direct contact information for care management team;      Provided patient with written educational materials related to hypo and hyperglycemia and importance of correct treatment;       Reviewed scheduled/upcoming provider appointments including: 02-04-2022 at 1140 am with the pcp;         Advised patient, providing education and rationale, to check cbg when you have symptoms of  low or high blood sugar and has a continuous glucose reader, freestyle Libre  and record        call provider for findings outside established parameters;       Referral made to pharmacy team for assistance with ongoing support and education for medication needs ;       Referral made to social work team for assistance with ongoing support and education for effective  management of stress, anxiety, depression ;      Referral made to community resources care guide team for assistance with food resources and community resources;      Review of patient status, including review of consultants reports, relevant laboratory and other test results, and medications completed;       Advised patient to discuss changes in her DM and questions and concerns with provider;      Screening for signs and symptoms of depression related to chronic disease state;        Assessed social determinant of health barriers;         Symptom Management: Take medications as prescribed   Attend all scheduled provider appointments Call provider office for new concerns or questions  call the Suicide and Crisis Lifeline: 988 call the [place] National Suicide Prevention Lifeline: 8102769327 or TTY: 647-621-1559 TTY (925)403-8489) to talk to a trained counselor call 1-800-273-TALK (toll free, 24 hour hotline) if experiencing a Mental Health or [place]  keep appointment with eye doctor check feet daily for cuts, sores or redness trim toenails straight across manage portion size wash and dry feet carefully every day wear comfortable, cotton socks wear comfortable, well-fitting shoes  Follow Up Plan: Telephone follow up appointment with care management team member scheduled for: 03-30-2022 at 0945 am       CCM Expected Outcome:  Monitor, Self-Manage and Reduce Symptoms of HLD       Current Barriers:  Chronic Disease Management support and education needs related to effective management of HLD  Planned Interventions: Provider established cholesterol goals reviewed; Counseled on importance of regular laboratory monitoring as prescribed; Provided HLD educational materials; Reviewed role and benefits of statin for ASCVD risk reduction; Discussed strategies to manage statin-induced myalgias; Reviewed importance of limiting foods high in cholesterol; Screening for signs and  symptoms of depression related to chronic disease state;  Assessed social determinant of health barriers;   Symptom Management: Take medications as prescribed   Attend all scheduled provider appointments Call provider office for new concerns or questions  call the Suicide and Crisis Lifeline: 988 call the [place] National Suicide Prevention Lifeline: 5134950663 or TTY: 725-704-6927 TTY 531-306-6805) to talk to a trained counselor call 1-800-273-TALK (toll free, 24 hour hotline) if experiencing a Mental Health or [place]  - take all medications exactly as prescribed - call doctor with any symptoms you believe are related to your medicine - call doctor when you experience any new symptoms - go to all doctor appointments as scheduled - adhere to prescribed diet: heart healthy/ADA diet   Follow Up Plan: Telephone follow up appointment with care management team member scheduled for: 03-30-2022 at 0945 am       CCM Expected Outcome:  Monitor, Self-Manage and Reduce Symptoms of: Depression       Current Barriers:  Knowledge Deficits related to how to manage emotions and stress level in a patient with multiple chronic conditions including depression Care Coordination needs related to resources in the community for food insecurities and additional support for social  worker support  in a patient with depression and increased stress level Chronic Disease Management support and education needs related to effective management of depression  Lacks caregiver support.   Planned Interventions: Evaluation of current treatment plan related to depression  and patient's adherence to plan as established by provider Advised patient to call the office for changes in mood, anxiety, depression, stress level, or mental health needs  Provided education to patient re: doing mindfulness activities, journal writing, creative ways to help her when she is stressed out. The patient was tearful on the  phone because she is concerned about her feet and toes. She states she has "bugs" coming out of her toe and she is afraid that she is going to loose her toes. Education provided on seeking emergent care for acute changes. She does have a follow up in place with the pcp on 02-04-2022 and podiatrist on 02-10-2022. Encouraged her to keep these appointments.  Reviewed medications with patient and discussed compliance. States she is compliant with medications Collaborated with LCSW and scheduling care guides regarding the need for follow up by LCSW. The LCSW has worked with the patient before for depression and other needs Provided patient with resources and support for effective management of depression  educational materials related to managing her stress level and depression  Reviewed scheduled/upcoming provider appointments including 02-04-2022 at 1140 am, she states she has a Education officer, museum coming out on 02-03-2022 to her home from [place] referral for food insecurities and scheduling follow up with LCSW Social Work referral for support and education needs related to depression and stress Discussed plans with patient for ongoing care management follow up and provided patient with direct contact information for care management team Advised patient to discuss changes in her depression and anxiety level, questions and concerns on how to best manage her chronic care needs due to limited support system with provider Screening for signs and symptoms of depression related to chronic disease state  Assessed social determinant of health barriers  Symptom Management: Take medications as prescribed   Attend all scheduled provider appointments Call provider office for new concerns or questions  call the Suicide and Crisis Lifeline: 988 call the [place] National Suicide Prevention Lifeline: 804-600-1321 or TTY: 445 334 9168 TTY [phone]) to talk to a trained counselor call 1-800-273-TALK (toll free,  24 hour hotline) if experiencing a Mental Health or [place]   Follow Up Plan: Telephone follow up appointment with care management team member scheduled for: 03-30-2022 at 0945 am       CCM Expected Outcome:  Monitor, Self-Manage, and Reduce Symptoms of Hypertension       Current Barriers:  Chronic Disease Management support and education needs related to effective management of HTN Lacks caregiver support.   BP Readings from Last 3 Encounters:  01/06/22 100/62  12/13/21 (!) 143/94  10/28/21 120/80    Planned Interventions: Evaluation of current treatment plan related to hypertension self management and patient's adherence to plan as established by provider;   Provided education to patient re: stroke prevention, s/s of heart attack and stroke; Reviewed prescribed diet heart  healthy/ADA diet  Reviewed medications with patient and discussed importance of compliance;  Discussed plans with patient for ongoing care management follow up and provided patient with direct contact information for care management team; Advised patient, providing education and rationale, to monitor blood pressure daily and record, calling PCP for findings outside established parameters;  Reviewed scheduled/upcoming provider appointments including: 02-04-2022 at 1140 am  Advised patient to discuss changes in blood pressures and heart health with provider; Provided education on prescribed diet heart healthy/ADA diet ;  Discussed complications of poorly controlled blood pressure such as heart disease, stroke, circulatory complications, vision complications, kidney impairment, sexual dysfunction;  Screening for signs and symptoms of depression related to chronic disease state;  Assessed social determinant of health barriers;   Symptom Management: Take medications as prescribed   Attend all scheduled provider appointments Call provider office for new concerns or questions  call the Suicide and Crisis  Lifeline: 988 call the [place] National Suicide Prevention Lifeline: (847)024-9006 or TTY: (772)449-7159 TTY 361 733 4092) to talk to a trained counselor call 1-800-273-TALK (toll free, 24 hour hotline) if experiencing a Mental Health or [place]  check blood pressure weekly learn about high blood pressure call doctor for signs and symptoms of high blood pressure develop an action plan for high blood pressure keep all doctor appointments take medications for blood pressure exactly as prescribed report new symptoms to your doctor  Follow Up Plan: Telephone follow up appointment with care management team member scheduled for: 03-30-2022 at 0945 am          Plan:Telephone follow up appointment with care management team member scheduled for:  03-30-2022 at 0945 am  Noreene Larsson RN, MSN, CCM RN Care Manager  Chronic Care Management Direct Number: [phone]

## 2022-02-02 NOTE — Plan of Care (Signed)
Chronic Care Management Provider Comprehensive Care Plan    02/02/2022 Name: Linda [name] MRN: [id number] DOB: [birth year]  Referral to Chronic Care Management (CCM) services was placed by Provider:  Dr. [name] on Date: 01-23-2022.  Chronic Condition 1: DM Provider Assessment and Plan  Control: Uncontrolled Recommend check sugars fasting daily, sugar running between 180-400 Recommend check feet daily. Recommend annual eye exams. Medicines: Stopped trulicity to 4.5 mg. Added Mounjaro 2.5 mg once a week. Tresiba to 60 U before bed, Synjardy 12.5 mg/ 1000 mg 1 tablet twice daily Continue to work on eating a healthy diet and exercise.  Labs drawn today.            Relevant Medications    tirzepatide (MOUNJARO) 2.5 MG/0.5ML Pen     Expected Outcome/Goals Addressed This Visit (Provider CCM goals/Provider Assessment and plan   CCM (Diabetes)  EXPECTED OUTCOME:  MONITOR,SELF- MANAGE AND REDUCE SYMPTOMS OF Diabetes  Symptom Management Condition 1: Take all medications as prescribed Attend all scheduled provider appointments Call provider office for new concerns or questions  call the Suicide and Crisis Lifeline: 988 call the [place] National Suicide Prevention Lifeline: 678 741 5512 or TTY: 276-560-6385 TTY [phone]) to talk to a trained counselor call 1-800-273-TALK (toll free, 24 hour hotline) if experiencing a Mental Health or [place]  keep appointment with eye doctor check feet daily for cuts, sores or redness trim toenails straight across manage portion size wash and dry feet carefully every day wear comfortable, cotton socks wear comfortable, well-fitting shoes  Chronic Condition 2: HTN Provider Assessment and Plan Well controlled.  No changes to medicines. Continue Losartan potassium 50 mg take 1 tablet daily, Potassium Chloride 10 meq take 2 capsules BID Continue to work on eating a healthy diet and exercise.  Labs drawn today.   Relevant Medications     tirzepatide (MOUNJARO) 2.5 MG/0.5ML Pen    Other Relevant Orders    Comprehensive metabolic panel (Completed)    CBC with Differential/Platelet (Completed)    Cardiovascular Risk Assessment (Completed)    Expected Outcome/Goals Addressed This Visit (Provider CCM goals/Provider Assessment and plan   CCM (HYPERTENSION)  EXPECTED OUTCOME:  MONITOR,SELF- MANAGE AND REDUCE SYMPTOMS OF HYPERTENSION   Symptom Management Condition 2: Take all medications as prescribed Attend all scheduled provider appointments Call provider office for new concerns or questions  call the Suicide and Crisis Lifeline: 988 call the [place] National Suicide Prevention Lifeline: 207-866-5324 or TTY: 939 064 2079 TTY [phone]) to talk to a trained counselor call 1-800-273-TALK (toll free, 24 hour hotline) if experiencing a Mental Health or [place]  check blood pressure weekly learn about high blood pressure call doctor for signs and symptoms of high blood pressure keep all doctor appointments take medications for blood pressure exactly as prescribed report new symptoms to your doctor  Chronic Condition 3: HLD Provider Assessment and Plan  Well controlled.  No changes to medicines. Taking Atorvastatin 10 mg 1 tablet daily, lovaza 1 gm 2 capsules twice daily. Continue to work on eating a healthy diet and exercise.  Labs drawn today.           Relevant Orders    Lipid panel (Completed)     Expected Outcome/Goals Addressed This Visit (Provider CCM goals/Provider Assessment and plan   CCM (HLD)  EXPECTED OUTCOME:  MONITOR,SELF- MANAGE AND REDUCE SYMPTOMS OF HLD   Symptom Management Condition 3: Take all medications as prescribed Attend all scheduled provider appointments Call provider office for new concerns or  questions  call the Suicide and Crisis Lifeline: 988 call the [place] National Suicide Prevention Lifeline: (854)322-9697 or TTY: 775-590-0893 TTY  737 261 4976) to talk to a trained counselor call 1-800-273-TALK (toll free, 24 hour hotline) if experiencing a Mental Health or [place]  take all medications exactly as prescribed call doctor with any symptoms you believe are related to your medicine call doctor when you experience any new symptoms go to all doctor appointments as scheduled adhere to prescribed diet: heart healthy/ADA diet    Chronic Condition 4: Depression Provider Assessment and Plan Patient is currently taking Fetzima 80 mg daily, Bupropion 300 mg 1 tablet daily, Buspirone 5 mg take 1 tablet three times a day. Gets depressed intermittently. Medications working well.      Expected Outcome/Goals Addressed This Visit (Provider CCM goals/Provider Assessment and plan   CCM (Depression )  EXPECTED OUTCOME:  MONITOR,SELF- MANAGE AND REDUCE SYMPTOMS OF Depression    Symptom Management Condition 4: Take all medications as prescribed Attend all scheduled provider appointments Call provider office for new concerns or questions  call the Suicide and Crisis Lifeline: 988 call the [place] National Suicide Prevention Lifeline: 3077165268 or TTY: 438-321-9737 TTY (615) 412-7119) to talk to a trained counselor call 1-800-273-TALK (toll free, 24 hour hotline) if experiencing a Mental Health or [place] Crisis    Problem List Patient Active Problem List   Diagnosis Date Noted   Scabies infestation 01/06/2022   Pulmonary nodule 12/30/2021   Back pain of lumbar region with sciatica 10/01/2021   Erythema of foot 10/01/2021   Right hip pain 06/27/2021   Left hip pain 06/27/2021   Falls frequently 06/23/2021   Weakness of both lower extremities 06/23/2021   Hypertension complicating diabetes ([place]) 03/23/2021   Type 2 diabetes mellitus with hyperglycemia ([place]) 01/20/2021   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   Binge eating disorder 11/23/2020   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 04/17/2020    Idiopathic thrombocytopenic purpura (ITP) ([place]) 01/27/2020   Malignant neoplasm of lower-inner quadrant of right breast of female, estrogen receptor negative ([place]) 10/01/2019   Chronic pain syndrome 07/08/2019   Depression, major, recurrent, mild ([place]) 60/10/9321   Uncomplicated opioid dependence ([place]) 07/08/2019   Mixed hyperlipidemia 04/11/2019   Dyslipidemia associated with type 2 diabetes mellitus ([place]) 04/11/2019   Acquired hypothyroidism 04/11/2019   Vitamin D insufficiency 04/11/2019   Class 2 severe obesity due to excess calories with serious comorbidity and body mass index (BMI) of 38.0 to 38.9 in adult [facility]) 04/11/2019   Liver cirrhosis secondary to NASH (nonalcoholic steatohepatitis) ([place]) 09/13/2018    Class: Chronic   BRCA1 positive 06/14/2016    Medication Management  Current Outpatient Medications:    albuterol (VENTOLIN HFA) 108 (90 Base) MCG/ACT inhaler, Inhale 2 puffs into the lungs every 6 (six) hours as needed for wheezing or shortness of breath., Disp: 8 g, Rfl: 1   atorvastatin (LIPITOR) 10 MG tablet, Take 1 tablet (10 mg total) by mouth daily., Disp: 90 tablet, Rfl: 0   Blood Glucose Monitoring Suppl (ONETOUCH VERIO REFLECT) w/Device KIT, AS DIRECTED, Disp: , Rfl:    Blood Pressure Monitoring (SPHYGMOMANOMETER) MISC, 1 each by Does not apply route daily in the afternoon., Disp: 1 each, Rfl: 0   buPROPion (WELLBUTRIN XL) 300 MG 24 hr tablet, Take 1 tablet (300 mg total) by mouth every evening., Disp: 90 tablet, Rfl: 1   busPIRone (BUSPAR) 5 MG tablet, Take 1 tablet (5 mg total) by mouth 3 (three) times daily., Disp: 90  tablet, Rfl: 3   calcium citrate-vitamin D (CITRACAL+D) 315-200 MG-UNIT tablet, Take 1 tablet by mouth 2 (two) times daily., Disp: , Rfl:    cariprazine (VRAYLAR) 1.5 MG capsule, Take 1 capsule (1.5 mg total) by mouth daily., Disp: 30 capsule, Rfl: 0   clotrimazole-betamethasone (LOTRISONE) cream, Apply twice daily as needed to area of concern, Disp:  45 g, Rfl: 2   Continuous Blood Gluc Receiver (FREESTYLE LIBRE 2 READER) DEVI, E11.69 Check blood sugar 4 times daily as directed, Disp: 1 each, Rfl: 0   Continuous Blood Gluc Sensor (FREESTYLE LIBRE 2 SENSOR) MISC, E11.69 Change sensor every 14 days as directed, Disp: 6 each, Rfl: 3   cyclobenzaprine (FLEXERIL) 10 MG tablet, Take one tablet by mouth every 8 hours as needed for muscle spasms, Disp: 270 tablet, Rfl: 1   dicyclomine (BENTYL) 20 MG tablet, TAKE ONE TABLET BY MOUTH BEFORE MEALS AND AT BEDTIME AS NEEDED FOR STOMACH CRAMPING, Disp: 180 tablet, Rfl: 1   Empagliflozin-metFORMIN HCl (SYNJARDY) 12.05-998 MG TABS, Take 1 tablet by mouth 2 (two) times daily., Disp: 180 tablet, Rfl: 0   famotidine (PEPCID) 20 MG tablet, Take 1 tablet (20 mg total) by mouth 2 (two) times daily., Disp: 180 tablet, Rfl: 1   ferrous sulfate 325 (65 FE) MG tablet, Take 325 mg by mouth every evening., Disp: , Rfl:    FETZIMA 80 MG CP24, TAKE ONE CAPSULE BY MOUTH EVERY EVENING, Disp: 90 capsule, Rfl: 1   gabapentin (NEURONTIN) 300 MG capsule, Take 2 capsules (600 mg total) by mouth 3 (three) times daily., Disp: 180 capsule, Rfl: 3   glucose blood (ONETOUCH VERIO) test strip, Use as instructed, Disp: 100 each, Rfl: 12   insulin degludec (TRESIBA FLEXTOUCH) 200 UNIT/ML FlexTouch Pen, Inject 52 Units into the skin daily., Disp: 9 mL, Rfl: 3   Lancets (ONETOUCH DELICA PLUS [id number]) MISC, USE TO check blood glucose 2-3 times daily AS DIRECTED, Disp: 100 each, Rfl: 3   levothyroxine (SYNTHROID) 75 MCG tablet, Take 1 tablet (75 mcg total) by mouth daily., Disp: 90 tablet, Rfl: 1   lisdexamfetamine (VYVANSE) 70 MG capsule, Take 1 capsule (70 mg total) by mouth every morning., Disp: 30 capsule, Rfl: 0   losartan (COZAAR) 100 MG tablet, Take 1 tablet (100 mg total) by mouth daily., Disp: 90 tablet, Rfl: 0   Magnesium 500 MG CAPS, 500 mg., Disp: , Rfl:    morphine (MS CONTIN) 30 MG 12 hr tablet, Take 1 tablet (30 mg total) by  mouth every 12 (twelve) hours., Disp: 60 tablet, Rfl: 0   Multiple Vitamin (MULTIVITAMIN WITH MINERALS) TABS tablet, Take 1 tablet by mouth daily. , Disp: , Rfl:    mupirocin ointment (BACTROBAN) 2 %, Apply topically 2 (two) times daily., Disp: , Rfl:    omega-3 acid ethyl esters (LOVAZA) 1 g capsule, Take 2 capsules (2 g total) by mouth 2 (two) times daily., Disp: 360 capsule, Rfl: 1   ondansetron (ZOFRAN) 4 MG tablet, Take 1 tablet (4 mg total) by mouth every 4 (four) hours as needed for nausea., Disp: 90 tablet, Rfl: 3   pantoprazole (PROTONIX) 40 MG tablet, Take 1 tablet (40 mg total) by mouth 2 (two) times daily., Disp: 180 tablet, Rfl: 1   permethrin (NIX) 0.25 % LIQD, Patients should massage permethrin cream thoroughly into the skin from the neck to the soles of the feet, including areas under the fingernails and toenails., Disp: 147 mL, Rfl: 0   potassium chloride (MICRO-K) 10 MEQ  CR capsule, Take 2 capsules (20 mEq total) by mouth 2 (two) times daily., Disp: 360 capsule, Rfl: 3   prochlorperazine (COMPAZINE) 10 MG tablet, Take 10 mg by mouth every 6 (six) hours as needed., Disp: , Rfl:    rOPINIRole (REQUIP) 0.25 MG tablet, Take 1 tablet (0.25 mg total) by mouth at bedtime., Disp: 90 tablet, Rfl: 1   tirzepatide (MOUNJARO) 2.5 MG/0.5ML Pen, Inject 2.5 mg into the skin once a week., Disp: 2 mL, Rfl: 0   Vitamin D, Ergocalciferol, (DRISDOL) 1.25 MG (50000 UNIT) CAPS capsule, Take one capsule by mouth weekly, Disp: 12 capsule, Rfl: 1   zolpidem (AMBIEN) 10 MG tablet, One before bed, Disp: 30 tablet, Rfl: 5 No current facility-administered medications for this visit.  Facility-Administered Medications Ordered in Other Visits:    sodium chloride flush (NS) 0.9 % injection 10 mL, 10 mL, Intracatheter, PRN, Mosher, Kelli A, PA-C, 10 mL at 02/19/21 1004  Cognitive Assessment Identity Confirmed: : Name; DOB Cognitive Status: Normal   Functional Assessment Hearing Difficulty or Deaf:  yes Hearing Management: says has seen changes in her hearing Wear Glasses or Blind: yes Vision Management: wears glasses, has MD Concentrating, Remembering or Making Decisions Difficulty (CP): yes Concentration Management: says that she does forget things Difficulty Communicating: no Difficulty Eating/Swallowing: no Walking or Climbing Stairs Difficulty: yes Walking or Climbing Stairs: ambulation difficulty, requires equipment Mobility Management: has a rollator, frequent falls Dressing/Bathing Difficulty: no Doing Errands Independently Difficulty (such as shopping) (CP): no Change in Functional Status Since Onset of Current Illness/Injury: no   Caregiver Assessment  Primary Source of Support/Comfort: no one Name of Support/Comfort Primary Source: step mother but her step mother is sick and does not drive People in Home: alone Family Caregiver if Needed: none Primary Roles/Responsibilities: disabled Concerns About Impact on Relationships: states that she really does not have any body to support her. She talks to her step mom but she cannot help her like she did   Planned Interventions  Provided education to patient about basic DM disease process. The patient states that she is concerned about her toes and states she has a "bug" coming out of her toe. She feels nobody believes her. She is concerned as she feels she needs to be seen immediately. The patient was tearful but then she talked opening about her concerns. Does have a follow up with the pcp on 02-04-2022 and podiatry on 02-10-2022. She was considering going to [facility] to see if she could be seen today. Education and support given. Reflective listening; Reviewed medications with patient and discussed importance of medication adherence;        Reviewed prescribed diet with patient heart healthy/ADA diet ; Counseled on importance of regular laboratory monitoring as prescribed;        Discussed plans with patient for  ongoing care management follow up and provided patient with direct contact information for care management team;      Provided patient with written educational materials related to hypo and hyperglycemia and importance of correct treatment;       Reviewed scheduled/upcoming provider appointments including: 02-04-2022 at 1140 am with the pcp;         Advised patient, providing education and rationale, to check cbg when you have symptoms of low or high blood sugar and has a continuous glucose reader, freestyle Libre  and record        call provider for findings outside established parameters;  Referral made to pharmacy team for assistance with ongoing support and education for medication needs ;       Referral made to social work team for assistance with ongoing support and education for effective management of stress, anxiety, depression ;      Referral made to community resources care guide team for assistance with food resources and community resources;      Review of patient status, including review of consultants reports, relevant laboratory and other test results, and medications completed;       Advised patient to discuss changes in her DM and questions and concerns with provider;      Screening for signs and symptoms of depression related to chronic disease state;        Assessed social determinant of health barriers;        Provider established cholesterol goals reviewed; Counseled on importance of regular laboratory monitoring as prescribed; Provided HLD educational materials; Reviewed role and benefits of statin for ASCVD risk reduction; Discussed strategies to manage statin-induced myalgias; Reviewed importance of limiting foods high in cholesterol; Screening for signs and symptoms of depression related to chronic disease state;  Assessed social determinant of health barriers;  Evaluation of current treatment plan related to depression  and patient's adherence to plan as established  by provider Advised patient to call the office for changes in mood, anxiety, depression, stress level, or mental health needs  Provided education to patient re: doing mindfulness activities, journal writing, creative ways to help her when she is stressed out. The patient was tearful on the phone because she is concerned about her feet and toes. She states she has "bugs" coming out of her toe and she is afraid that she is going to loose her toes. Education provided on seeking emergent care for acute changes. She does have a follow up in place with the pcp on 02-04-2022 and podiatrist on 02-10-2022. Encouraged her to keep these appointments.  Reviewed medications with patient and discussed compliance. States she is compliant with medications Collaborated with LCSW and scheduling care guides regarding the need for follow up by LCSW. The LCSW has worked with the patient before for depression and other needs Provided patient with resources and support for effective management of depression  educational materials related to managing her stress level and depression  Reviewed scheduled/upcoming provider appointments including 02-04-2022 at 1140 am, she states she has a Education officer, museum coming out on 02-03-2022 to her home from [place] referral for food insecurities and scheduling follow up with LCSW Social Work referral for support and education needs related to depression and stress Discussed plans with patient for ongoing care management follow up and provided patient with direct contact information for care management team Advised patient to discuss changes in her depression and anxiety level, questions and concerns on how to best manage her chronic care needs due to limited support system with provider Screening for signs and symptoms of depression related to chronic disease state  Assessed social determinant of health barriers Evaluation of current treatment plan related to hypertension self  management and patient's adherence to plan as established by provider;   Provided education to patient re: stroke prevention, s/s of heart attack and stroke; Reviewed prescribed diet heart  healthy/ADA diet  Reviewed medications with patient and discussed importance of compliance;  Discussed plans with patient for ongoing care management follow up and provided patient with direct contact information for care management team; Advised patient, providing education and rationale, to  monitor blood pressure daily and record, calling PCP for findings outside established parameters;  Reviewed scheduled/upcoming provider appointments including: 02-04-2022 at 1140 am Advised patient to discuss changes in blood pressures and heart health with provider; Provided education on prescribed diet heart healthy/ADA diet ;  Discussed complications of poorly controlled blood pressure such as heart disease, stroke, circulatory complications, vision complications, kidney impairment, sexual dysfunction;  Screening for signs and symptoms of depression related to chronic disease state;  Assessed social determinant of health barriers;     Interaction and coordination with outside resources, practitioners, and providers See CCM Referral  Care Plan: Available in MyChart

## 2022-02-02 NOTE — Progress Notes (Cosign Needed Addendum)
Error

## 2022-02-02 NOTE — Patient Instructions (Signed)
Please call the care guide team at [phone] if you need to cancel or reschedule your appointment.   If you are experiencing a Mental Health or [place] or need someone to talk to, please call the Suicide and Crisis Lifeline: 988 call the [place] National Suicide Prevention Lifeline: (276)838-4836 or TTY: (562) 377-4467 TTY 671-369-3771) to talk to a trained counselor call 1-800-273-TALK (toll free, 24 hour hotline)   Following is a copy of the CCM Program Consent:  CCM service includes personalized support from designated clinical staff supervised by the physician, including individualized plan of care and coordination with other care providers 24/7 contact phone numbers for assistance for urgent and routine care needs. Service will only be billed when office clinical staff spend 20 minutes or more in a month to coordinate care. Only one practitioner may furnish and bill the service in a calendar month. The patient may stop CCM services at amy time (effective at the end of the month) by phone call to the office staff. The patient will be responsible for cost sharing (co-pay) or up to 20% of the service fee (after annual deductible is met)  Following is a copy of your full provider care plan:   Goals Addressed             This Visit's Progress    CCM Expected Outcome:  Monitor, Self-Manage and Reduce Symptoms of Diabetes       Current Barriers:  Knowledge Deficits related to concerns about places on her toes and not being able to be seen by podiatrist until next week, the patient expressed concern over being diabetic and not wanting to lose her toes Care Coordination needs related to resources in the area to help with her expressed needs and caring for her self due to lack of support system in a patient with DM Chronic Disease Management support and education needs related to effective management of DM Lacks caregiver support.  Lab Results  Component Value Date   HGBA1C  7.4 (H) 01/06/2022     Planned Interventions: Provided education to patient about basic DM disease process. The patient states that she is concerned about her toes and states she has a "bug" coming out of her toe. She feels nobody believes her. She is concerned as she feels she needs to be seen immediately. The patient was tearful but then she talked opening about her concerns. Does have a follow up with the pcp on 02-04-2022 and podiatry on 02-10-2022. She was considering going to [facility] to see if she could be seen today. Education and support given. Reflective listening; Reviewed medications with patient and discussed importance of medication adherence;        Reviewed prescribed diet with patient heart healthy/ADA diet ; Counseled on importance of regular laboratory monitoring as prescribed;        Discussed plans with patient for ongoing care management follow up and provided patient with direct contact information for care management team;      Provided patient with written educational materials related to hypo and hyperglycemia and importance of correct treatment;       Reviewed scheduled/upcoming provider appointments including: 02-04-2022 at 1140 am with the pcp;         Advised patient, providing education and rationale, to check cbg when you have symptoms of low or high blood sugar and has a continuous glucose reader, freestyle Elenor Legato  and record        call provider for findings outside  established parameters;       Referral made to pharmacy team for assistance with ongoing support and education for medication needs ;       Referral made to social work team for assistance with ongoing support and education for effective management of stress, anxiety, depression ;      Referral made to community resources care guide team for assistance with food resources and community resources;      Review of patient status, including review of consultants reports, relevant laboratory and other  test results, and medications completed;       Advised patient to discuss changes in her DM and questions and concerns with provider;      Screening for signs and symptoms of depression related to chronic disease state;        Assessed social determinant of health barriers;         Symptom Management: Take medications as prescribed   Attend all scheduled provider appointments Call provider office for new concerns or questions  call the Suicide and Crisis Lifeline: 988 call the [place] National Suicide Prevention Lifeline: (731)814-7082 or TTY: 602-750-4836 TTY (574)822-9652) to talk to a trained counselor call 1-800-273-TALK (toll free, 24 hour hotline) if experiencing a Mental Health or [place]  keep appointment with eye doctor check feet daily for cuts, sores or redness trim toenails straight across manage portion size wash and dry feet carefully every day wear comfortable, cotton socks wear comfortable, well-fitting shoes  Follow Up Plan: Telephone follow up appointment with care management team member scheduled for: 03-30-2022 at 0945 am       CCM Expected Outcome:  Monitor, Self-Manage and Reduce Symptoms of HLD       Current Barriers:  Chronic Disease Management support and education needs related to effective management of HLD  Planned Interventions: Provider established cholesterol goals reviewed; Counseled on importance of regular laboratory monitoring as prescribed; Provided HLD educational materials; Reviewed role and benefits of statin for ASCVD risk reduction; Discussed strategies to manage statin-induced myalgias; Reviewed importance of limiting foods high in cholesterol; Screening for signs and symptoms of depression related to chronic disease state;  Assessed social determinant of health barriers;   Symptom Management: Take medications as prescribed   Attend all scheduled provider appointments Call provider office for new concerns or questions   call the Suicide and Crisis Lifeline: 988 call the [place] National Suicide Prevention Lifeline: (831) 316-6711 or TTY: (401)861-9909 TTY 301 520 7812) to talk to a trained counselor call 1-800-273-TALK (toll free, 24 hour hotline) if experiencing a Mental Health or [place]  - take all medications exactly as prescribed - call doctor with any symptoms you believe are related to your medicine - call doctor when you experience any new symptoms - go to all doctor appointments as scheduled - adhere to prescribed diet: heart healthy/ADA diet   Follow Up Plan: Telephone follow up appointment with care management team member scheduled for: 03-30-2022 at 0945 am       CCM Expected Outcome:  Monitor, Self-Manage and Reduce Symptoms of: Depression       Current Barriers:  Knowledge Deficits related to how to manage emotions and stress level in a patient with multiple chronic conditions including depression Care Coordination needs related to resources in the community for food insecurities and additional support for social worker support  in a patient with depression and increased stress level Chronic Disease Management support and education needs related to effective management of depression  Lacks caregiver  support.   Planned Interventions: Evaluation of current treatment plan related to depression  and patient's adherence to plan as established by provider Advised patient to call the office for changes in mood, anxiety, depression, stress level, or mental health needs  Provided education to patient re: doing mindfulness activities, journal writing, creative ways to help her when she is stressed out. The patient was tearful on the phone because she is concerned about her feet and toes. She states she has "bugs" coming out of her toe and she is afraid that she is going to loose her toes. Education provided on seeking emergent care for acute changes. She does have a follow up in place with  the pcp on 02-04-2022 and podiatrist on 02-10-2022. Encouraged her to keep these appointments.  Reviewed medications with patient and discussed compliance. States she is compliant with medications Collaborated with LCSW and scheduling care guides regarding the need for follow up by LCSW. The LCSW has worked with the patient before for depression and other needs Provided patient with resources and support for effective management of depression  educational materials related to managing her stress level and depression  Reviewed scheduled/upcoming provider appointments including 02-04-2022 at 1140 am, she states she has a Education officer, museum coming out on 02-03-2022 to her home from [place] referral for food insecurities and scheduling follow up with LCSW Social Work referral for support and education needs related to depression and stress Discussed plans with patient for ongoing care management follow up and provided patient with direct contact information for care management team Advised patient to discuss changes in her depression and anxiety level, questions and concerns on how to best manage her chronic care needs due to limited support system with provider Screening for signs and symptoms of depression related to chronic disease state  Assessed social determinant of health barriers  Symptom Management: Take medications as prescribed   Attend all scheduled provider appointments Call provider office for new concerns or questions  call the Suicide and Crisis Lifeline: 988 call the [place] National Suicide Prevention Lifeline: 404-473-7131 or TTY: 319-460-9209 TTY (502) 604-3316) to talk to a trained counselor call 1-800-273-TALK (toll free, 24 hour hotline) if experiencing a Mental Health or [place]   Follow Up Plan: Telephone follow up appointment with care management team member scheduled for: 03-30-2022 at 0945 am       CCM Expected Outcome:  Monitor, Self-Manage, and Reduce  Symptoms of Hypertension       Current Barriers:  Chronic Disease Management support and education needs related to effective management of HTN Lacks caregiver support.   BP Readings from Last 3 Encounters:  01/06/22 100/62  12/13/21 (!) 143/94  10/28/21 120/80    Planned Interventions: Evaluation of current treatment plan related to hypertension self management and patient's adherence to plan as established by provider;   Provided education to patient re: stroke prevention, s/s of heart attack and stroke; Reviewed prescribed diet heart  healthy/ADA diet  Reviewed medications with patient and discussed importance of compliance;  Discussed plans with patient for ongoing care management follow up and provided patient with direct contact information for care management team; Advised patient, providing education and rationale, to monitor blood pressure daily and record, calling PCP for findings outside established parameters;  Reviewed scheduled/upcoming provider appointments including: 02-04-2022 at 1140 am Advised patient to discuss changes in blood pressures and heart health with provider; Provided education on prescribed diet heart healthy/ADA diet ;  Discussed complications of poorly controlled  blood pressure such as heart disease, stroke, circulatory complications, vision complications, kidney impairment, sexual dysfunction;  Screening for signs and symptoms of depression related to chronic disease state;  Assessed social determinant of health barriers;   Symptom Management: Take medications as prescribed   Attend all scheduled provider appointments Call provider office for new concerns or questions  call the Suicide and Crisis Lifeline: 988 call the [place] National Suicide Prevention Lifeline: 831 704 1096 or TTY: 332-640-1421 TTY 516-193-0301) to talk to a trained counselor call 1-800-273-TALK (toll free, 24 hour hotline) if experiencing a Mental Health or [place]   check blood pressure weekly learn about high blood pressure call doctor for signs and symptoms of high blood pressure develop an action plan for high blood pressure keep all doctor appointments take medications for blood pressure exactly as prescribed report new symptoms to your doctor  Follow Up Plan: Telephone follow up appointment with care management team member scheduled for: 03-30-2022 at 0945 am          Patient verbalizes understanding of instructions and care plan provided today and agrees to view in [place]. Active MyChart status and patient understanding of how to access instructions and care plan via MyChart confirmed with patient.     Telephone follow up appointment with care management team member scheduled for: 03-30-2022 at 0945 am

## 2022-02-03 ENCOUNTER — Telehealth: Payer: Self-pay

## 2022-02-03 NOTE — Telephone Encounter (Signed)
Mr. Doran Durand (Education officer, [employer]) called to report that he was out visiting Linda [name] today.  She had mentioned that she was having issues with hallucinations (seeing bugs and other things) since starting on the [place].  He was concerned about her needing to see Mental Health and she did ask him if a referral could be made.  She has an appointment tomorrow with [name].

## 2022-02-04 ENCOUNTER — Encounter: Payer: Self-pay | Admitting: Hematology and Oncology

## 2022-02-04 ENCOUNTER — Ambulatory Visit: Payer: PPO | Admitting: Podiatry

## 2022-02-04 ENCOUNTER — Ambulatory Visit (INDEPENDENT_AMBULATORY_CARE_PROVIDER_SITE_OTHER): Payer: PPO | Admitting: Family Medicine

## 2022-02-04 ENCOUNTER — Ambulatory Visit (INDEPENDENT_AMBULATORY_CARE_PROVIDER_SITE_OTHER): Payer: PPO

## 2022-02-04 ENCOUNTER — Encounter: Payer: Self-pay | Admitting: Family Medicine

## 2022-02-04 VITALS — BP 130/72

## 2022-02-04 VITALS — BP 102/64 | HR 100 | Temp 98.2°F | Resp 14 | Ht 62.0 in | Wt 206.0 lb

## 2022-02-04 DIAGNOSIS — M79671 Pain in right foot: Secondary | ICD-10-CM | POA: Diagnosis not present

## 2022-02-04 DIAGNOSIS — L03031 Cellulitis of right toe: Secondary | ICD-10-CM | POA: Diagnosis not present

## 2022-02-04 DIAGNOSIS — L03119 Cellulitis of unspecified part of limb: Secondary | ICD-10-CM

## 2022-02-04 DIAGNOSIS — F333 Major depressive disorder, recurrent, severe with psychotic symptoms: Secondary | ICD-10-CM

## 2022-02-04 DIAGNOSIS — Z794 Long term (current) use of insulin: Secondary | ICD-10-CM | POA: Diagnosis not present

## 2022-02-04 DIAGNOSIS — L97512 Non-pressure chronic ulcer of other part of right foot with fat layer exposed: Secondary | ICD-10-CM | POA: Diagnosis not present

## 2022-02-04 DIAGNOSIS — E1165 Type 2 diabetes mellitus with hyperglycemia: Secondary | ICD-10-CM

## 2022-02-04 MED ORDER — CEFTRIAXONE SODIUM 1 G IJ SOLR
1.0000 g | Freq: Once | INTRAMUSCULAR | Status: AC
  Administered 2022-02-04: 1 g via INTRAMUSCULAR

## 2022-02-04 MED ORDER — CLINDAMYCIN HCL 300 MG PO CAPS: 300.0000 mg | ORAL_CAPSULE | Freq: Three times a day (TID) | ORAL | 0 refills | Status: DC

## 2022-02-04 MED ORDER — CARIPRAZINE HCL 3 MG PO CAPS: 3.0000 mg | ORAL_CAPSULE | Freq: Every day | ORAL | 0 refills | Status: DC

## 2022-02-04 MED ORDER — TIRZEPATIDE 5 MG/0.5ML ~~LOC~~ SOAJ: 5.0000 mg | SUBCUTANEOUS | 0 refills | Status: DC

## 2022-02-04 MED ORDER — SULFAMETHOXAZOLE-TRIMETHOPRIM 800-160 MG PO TABS: 1.0000 | ORAL_TABLET | Freq: Two times a day (BID) | ORAL | 0 refills | Status: DC

## 2022-02-04 NOTE — Progress Notes (Signed)
Subjective:  Patient ID: Linda [name], female    DOB: [birth year],  MRN: [id number]  Chief Complaint  Patient presents with   Wound Check    Pt is diabetic , her 1 & 2 toes on right foot seem to be infected . She have the nail fall on about 3 days ago . She states there has bleeding quite a bit of  bleeding for the past week    52 y.o. female presents with concern for redness swelling wound to the second toe of the right foot.  Says this wound has been present for couple weeks.  She has noticed a lot of bleeding from the second toe into the dressing she has been putting on recently.  Denies any nausea vomiting fever chills.  She did see her primary care doctor who gave her some IV antibiotics through her port with injection but has not been taking any recently.  She also notices that the toes on the left foot appear to be slightly red however no open wound or significant pain associate with that.  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 No longer has    Allergies  Allergen Reactions   Codeine Shortness Of Breath    Other reaction(s): SHOB   Augmentin [Amoxicillin-Pot Clavulanate] Diarrhea   Celecoxib Other (See Comments)    Unknown reaction Other reaction(s): Unknown   Ezetimibe     Other reaction(s): Unknown   Ezetimibe-Simvastatin Other (See Comments)    Unknown reaction   Propranolol     Other reaction(s): Unknown   Propranolol Hcl Other (See Comments)    Unknown reaction   Simvastatin     Other reaction(s): Unknown    ROS: Negative except as per HPI above  Objective:  General: AAO x3, NAD  Dermatological: Attention directed to the hallux and second toe of the right foot there are noted to be open wounds with eschars present.  Significant erythema and mild edema of the second toe.  Wounds do not appear to probe deep to bone primarily on the plantar aspect of the tuft of the second toe is the deepest area of wound with subcutaneous fat exposed.  No malodor no active drainage.  Distal tuft of the left first through third toes also has mild erythema and edema however no open wounds present and not as severe  as the right foot.  Vascular:  Dorsalis Pedis artery and Posterior Tibial artery pedal pulses are 2/4 bilateral.  Capillary fill time < 3 sec to all digits.   Neruologic: Grossly  diminished via light touch to the digits bilateral foot protective sensation is absent.   Musculoskeletal: Hammertoe deformity of the second toe right foot  Gait: Unassisted, Nonantalgic.     Radiographs:  Date: 02/04/2022 XR right foot weightbearing AP/Lateral/Oblique   Findings: no soft tissue emphysema, no signs of osteomyelitis about the second toe of the right foot Assessment:   1. Cellulitis of second toe of right foot   2. Right foot ulcer, with fat layer exposed ([place])   3. Right foot pain      Plan:  Patient was evaluated and treated and all questions  answered.  Ulcer distal tuft of the second toe right foot with associated cellulitis, no obvious osseous infection or gas producing infection present at this time -We discussed the etiology and factors that are a part of the wound healing process.  We also discussed the risk of infection both soft tissue and osteomyelitis from open ulceration.  Discussed the risk of limb loss if this happens or worsens. -Debridement as below. -Dressed with betadine, DSD. -Continue home dressing changes daily with betadine and dry gauze -Continue off-loading with surgical shoe. -Vascular testing deferred, pt has strong pedal pulses -Last antibiotics: Erx for clindamycin 300 mg 3 x daily for 10 days -patient instructed to monitor closely erythema and if it progresses into her foot or she develops nausea vomiting fever chills to report to the emergency department -Imaging: x-ray reviewed, shows no signs of erosions, osteolysis, osteomyelitis or emphysema. -Patient will see Dr. Adah Perl next week for interim check and then follow-up with me in 2 weeks  Procedure: Excisional Debridement of Wound distal tuft of the second toe right foot Rationale: Removal of non-viable soft tissue from the wound to promote healing.  Anesthesia: none Post-Debridement Wound Measurements: 1 cm x 0.5 cm x 0.3 cm  Type of Debridement: Sharp Excisional Tissue Removed: Non-viable soft tissue Depth of Debridement: subcutaneous tissue. Technique: Sharp excisional debridement to bleeding, viable wound base.  Dressing: Dry, sterile, compression dressing. Disposition: Patient tolerated procedure well.      Return in about 2 weeks (around 02/18/2022) for Follow-up right second toe ulceration and cellulitis.          Everitt Amber, DPM [facility]

## 2022-02-04 NOTE — Patient Instructions (Addendum)
Skin infection of toe: Take Augmentin 875 mg twice daily x 7 days. Podiatry appointment at 1:15 today with Dr. Loel Lofty.   If unable to be seen there, patient to go get foot x-ray. Checking blood count and uric acid. Rocephin shot given.  Increase mounjaro to 5 mg weekly.   Increase vraylar to 3 mg daily.   Call [facility]  [phone] for therapy and psychiatric care.  Also may call, [facility] for grief counseling.  [phone].

## 2022-02-04 NOTE — Progress Notes (Deleted)
Duplicate. kc

## 2022-02-04 NOTE — Progress Notes (Signed)
Subjective:  Patient ID: Linda [name], female    DOB: 12-Oct-1970  Age: 52 y.o. MRN: [id number]  Chief Complaint  Patient presents with   Depression    4 week follow up   HPI: Patient presents for follow-up of depression.  I started her on Vraylar 1.5 mg daily at her last visit.  I continued her other medications.  She feels that it has helped but not enough.  Patient is experiencing a pest infestation at home or visual hallucinations.  Patient has had a past company, and they have said she has carpet beetles.  They tell her these should not cause her to have itching or rashes.  She continues to show me photographs of debris which she feels are evidence of an infestation.  Patient is also complaining of swelling and redness of her feet.  Left greater than right.  She does see [facility] but this has been significantly worsening over the last 3 to 4 weeks.  She denies fever.  Depression        Associated symptoms include no fatigue, no appetite change, no myalgias and no headaches.        02/07/2022   11:47 AM 02/04/2022   11:45 AM 01/06/2022    7:59 AM 07/01/2021    2:19 PM 06/23/2021    7:40 AM  Depression screen PHQ 2/9  Decreased Interest '2 2 1 3 '$ 0  Down, Depressed, Hopeless '2 3 2 1 '$ 0  PHQ - 2 Score '4 5 3 4 '$ 0  Altered sleeping '1 2 3 3   '$ Tired, decreased energy '2 2 3 3   '$ Change in appetite '2 3 3 3   '$ Feeling bad or failure about yourself  '2 3 3 3   '$ Trouble concentrating '1 3 3 3   '$ Moving slowly or fidgety/restless 1 0 2 2   Suicidal thoughts 0 0 1 0   PHQ-9 Score '13 18 21 21   '$ Difficult doing work/chores Somewhat difficult Very difficult Very difficult Somewhat difficult        09/13/2021   11:21 AM 10/07/2021    3:00 PM 12/13/2021    2:24 PM 02/02/2022    1:53 PM 02/07/2022   11:42 AM  Fall Risk  Falls in the past year?    1 1  Was there an injury with Fall?    1 1  Fall Risk Category Calculator    3 3  Fall Risk Category (Retired)    High   (RETIRED)  Patient Fall Risk Level Low fall risk Low fall risk Low fall risk High fall risk   Patient at Risk for Falls Due to    History of fall(s);Impaired balance/gait;Impaired mobility History of fall(s);Impaired balance/gait;Impaired mobility  Fall risk Follow up    Falls evaluation completed;Education provided;Falls prevention discussed     Current Outpatient Medications on File Prior to Visit  Medication Sig Dispense Refill   albuterol (VENTOLIN HFA) 108 (90 Base) MCG/ACT inhaler Inhale 2 puffs into the lungs every 6 (six) hours as needed for wheezing or shortness of breath. 8 g 1   atorvastatin (LIPITOR) 10 MG tablet Take 1 tablet (10 mg total) by mouth daily. 90 tablet 0   Blood Glucose Monitoring Suppl (ONETOUCH VERIO REFLECT) w/Device KIT AS DIRECTED     Blood Pressure Monitoring (SPHYGMOMANOMETER) MISC 1 each by Does not apply route daily in the afternoon. 1 each 0   buPROPion (WELLBUTRIN XL) 300 MG 24 hr tablet Take 1 tablet (  300 mg total) by mouth every evening. 90 tablet 1   busPIRone (BUSPAR) 5 MG tablet Take 1 tablet (5 mg total) by mouth 3 (three) times daily. 90 tablet 3   calcium citrate-vitamin D (CITRACAL+D) 315-200 MG-UNIT tablet Take 1 tablet by mouth 2 (two) times daily.     cetirizine (ZYRTEC) 10 MG tablet Take by mouth.     clotrimazole-betamethasone (LOTRISONE) cream Apply twice daily as needed to area of concern 45 g 2   Continuous Blood Gluc Receiver (FREESTYLE LIBRE 2 READER) DEVI E11.69 Check blood sugar 4 times daily as directed 1 each 0   Continuous Blood Gluc Sensor (FREESTYLE LIBRE 2 SENSOR) MISC E11.69 Change sensor every 14 days as directed 6 each 3   cyclobenzaprine (FLEXERIL) 10 MG tablet Take one tablet by mouth every 8 hours as needed for muscle spasms 270 tablet 1   dicyclomine (BENTYL) 20 MG tablet TAKE ONE TABLET BY MOUTH BEFORE MEALS AND AT BEDTIME AS NEEDED FOR STOMACH CRAMPING 180 tablet 1   Empagliflozin-metFORMIN HCl (SYNJARDY) 12.05-998 MG TABS Take 1 tablet  by mouth 2 (two) times daily. 180 tablet 0   famotidine (PEPCID) 20 MG tablet Take 1 tablet (20 mg total) by mouth 2 (two) times daily. 180 tablet 1   ferrous sulfate 325 (65 FE) MG tablet Take 325 mg by mouth every evening.     FETZIMA 80 MG CP24 TAKE ONE CAPSULE BY MOUTH EVERY EVENING 90 capsule 1   gabapentin (NEURONTIN) 300 MG capsule Take 2 capsules (600 mg total) by mouth 3 (three) times daily. 180 capsule 3   glucose blood (ONETOUCH VERIO) test strip Use as instructed 100 each 12   insulin degludec (TRESIBA FLEXTOUCH) 200 UNIT/ML FlexTouch Pen Inject 52 Units into the skin daily. 9 mL 3   Lancets (ONETOUCH DELICA PLUS ZOXWRU04V) MISC USE TO check blood glucose 2-3 times daily AS DIRECTED 100 each 3   levothyroxine (SYNTHROID) 75 MCG tablet Take 1 tablet (75 mcg total) by mouth daily. 90 tablet 1   lisdexamfetamine (VYVANSE) 70 MG capsule Take 1 capsule (70 mg total) by mouth every morning. 30 capsule 0   losartan (COZAAR) 100 MG tablet Take 1 tablet (100 mg total) by mouth daily. 90 tablet 0   Magnesium 500 MG CAPS 500 mg.     morphine (MS CONTIN) 30 MG 12 hr tablet Take 1 tablet (30 mg total) by mouth every 12 (twelve) hours. 60 tablet 0   Multiple Vitamin (MULTIVITAMIN WITH MINERALS) TABS tablet Take 1 tablet by mouth daily.      omega-3 acid ethyl esters (LOVAZA) 1 g capsule Take 2 capsules (2 g total) by mouth 2 (two) times daily. 360 capsule 1   ondansetron (ZOFRAN) 4 MG tablet Take 1 tablet (4 mg total) by mouth every 4 (four) hours as needed for nausea. 90 tablet 3   pantoprazole (PROTONIX) 40 MG tablet Take 1 tablet (40 mg total) by mouth 2 (two) times daily. 180 tablet 1   potassium chloride (MICRO-K) 10 MEQ CR capsule Take 2 capsules (20 mEq total) by mouth 2 (two) times daily. 360 capsule 3   prochlorperazine (COMPAZINE) 5 MG tablet Take 5 mg by mouth every 6 (six) hours as needed.     rOPINIRole (REQUIP) 0.25 MG tablet Take 1 tablet (0.25 mg total) by mouth at bedtime. 90 tablet  1   Vitamin D, Ergocalciferol, (DRISDOL) 1.25 MG (50000 UNIT) CAPS capsule Take one capsule by mouth weekly 12 capsule 1  zolpidem (AMBIEN) 10 MG tablet One before bed 30 tablet 5   Current Facility-Administered Medications on File Prior to Visit  Medication Dose Route Frequency Provider Last Rate Last Admin   sodium chloride flush (NS) 0.9 % injection 10 mL  10 mL Intracatheter PRN Mosher, Vida Roller A, PA-C   10 mL at 02/19/21 1004   Past Medical History:  Diagnosis Date   Acute postoperative respiratory insufficiency 04/17/2020   AKI (acute kidney injury) ([place]) 04/17/2020   Anemia    Anemia, unspecified    Anemia, unspecified    Anemia, unspecified    Anxiety    Arthritis    BRCA gene mutation positive    Chronic pain syndrome    Depression    Diabetes mellitus without complication ([place])    type 2   Diabetic ulcer of toe of left foot associated with type 2 diabetes mellitus, with fat layer exposed ([place]) 03/27/2020   Gastritis    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 No longer has   Past Surgical History:  Procedure Laterality Date   APPENDECTOMY     BACK SURGERY     x 3 lower disc   BILATERAL TOTAL MASTECTOMY WITH AXILLARY LYMPH NODE DISSECTION Bilateral 09/2019   CHOLECYSTECTOMY     nephrolithiasis     ROBOTIC ASSISTED TOTAL HYSTERECTOMY WITH BILATERAL SALPINGO OOPHERECTOMY Bilateral 06/14/2016   Procedure: ROBOTIC ASSISTED TOTAL HYSTERECTOMY WITH BILATERAL SALPINGO OOPHORECTOMY;  Surgeon:  Nancy Marus, MD;  Location: [facility];  Service: Gynecology;  Laterality: Bilateral;    Family History  Problem Relation Age of Onset   Cancer Mother        breast   CAD Father    Diabetes Father    Heart failure Father    Cancer Father        renal carcinoma   Kidney failure Father    Cancer Brother 63       renal carcinoma.   Stroke Paternal Grandmother    Social History   Socioeconomic History   Marital status: Divorced    Spouse name: Not on file   Number of children: Not on file   Years of education: Not on file   Highest education level: Not on file  Occupational History   Occupation: disabled  Tobacco Use   Smoking status: Never   Smokeless tobacco: Never  Substance and Sexual Activity   Alcohol use: No   Drug use: No   Sexual activity: Yes  Other Topics Concern   Not on file  Social History Narrative   wears sunscreen, brushes and flosses daily, see's dentist bi-annually, has smoke/carbon monoxide detectors, wears a seatbelt and practices gun safety   Social Determinants of Health   Financial Resource Strain: Medium Risk (02/02/2022)   Overall Financial Resource Strain (CARDIA)    Difficulty of Paying Living Expenses: Somewhat hard  Food Insecurity: Food Insecurity Present (02/07/2022)   Hunger Vital Sign    Worried About  Running Out of Food in the Last Year: Sometimes true    Ran Out of Food in the Last Year: Sometimes true  Transportation Needs: No Transportation Needs (02/07/2022)   PRAPARE - Hydrologist (Medical): No    Lack of Transportation (Non-Medical): No  Recent Concern: Transportation Needs - Unmet Transportation Needs (01/19/2022)   PRAPARE - Transportation    Lack of Transportation (Medical): Yes    Lack of Transportation (Non-Medical): Yes  Physical Activity: Inactive (02/02/2022)   Exercise Vital Sign    Days of Exercise per Week: 0 days    Minutes of Exercise per Session: 0 min  Stress: Stress Concern Present  (02/02/2022)   [place]    Feeling of Stress : To some extent  Social Connections: Moderately Isolated (02/02/2022)   Social Connection and Isolation Panel [NHANES]    Frequency of Communication with Friends and Family: More than three times a week    Frequency of Social Gatherings with Friends and Family: Once a week    Attends Religious Services: 1 to 4 times per year    Active Member of [employer] or Organizations: No    Attends Archivist Meetings: Never    Marital Status: Divorced    Review of Systems  Constitutional:  Negative for appetite change, fatigue and fever.  HENT:  Negative for congestion, ear pain, sinus pressure and sore throat.   Respiratory:  Negative for cough, chest tightness, shortness of breath and wheezing.   Cardiovascular:  Negative for chest pain and palpitations.  Gastrointestinal:  Negative for abdominal pain, constipation, diarrhea, nausea and vomiting.  Genitourinary:  Negative for dysuria and hematuria.  Musculoskeletal:  Negative for arthralgias, back pain, joint swelling and myalgias.  Skin:  Negative for rash.  Neurological:  Negative for dizziness, weakness and headaches.  Psychiatric/Behavioral:  Positive for depression. Negative for dysphoric mood. The patient is not nervous/anxious.      Objective:  BP 102/64   Pulse 100   Temp 98.2 F (36.8 C)   Resp 14   Ht '5\' 2"'$  (1.575 m)   Wt 206 lb (93.4 kg)   LMP 06/13/2009   SpO2 98%   BMI 37.68 kg/m  .BP 102/64   Pulse 100   Temp 98.2 F (36.8 C)   Resp 14   Ht '5\' 2"'$  (1.575 m)   Wt 206 lb (93.4 kg)   LMP 06/13/2009   SpO2 98%   BMI 37.68 kg/m      02/10/2022   11:12 AM 02/04/2022    1:11 PM 02/04/2022   11:37 AM  BP/Weight  Systolic BP 660 630 160  Diastolic BP 65 72 64  Wt. (Lbs)   206  BMI   37.68 kg/m2    Physical Exam Vitals reviewed.  Constitutional:      Appearance: Normal appearance. She is obese.   Cardiovascular:     Rate and Rhythm: Normal rate and regular rhythm.     Heart sounds: Normal heart sounds.  Pulmonary:     Effort: Pulmonary effort is normal.     Breath sounds: Normal breath sounds.  Abdominal:     General: Abdomen is flat.     Palpations: Abdomen is soft.     Tenderness: There is no abdominal tenderness.  Skin:    Comments: 2nd toe of right foot erythema and edema. Peeling at cuticle. Entire foot edema. Nontender, but patient has neuropathy and is unable to  feel anything. Left foot has dry skin. Peeling.   Neurological:     Mental Status: She is alert and oriented to person, place, and time.  Psychiatric:        Mood and Affect: Mood normal.        Behavior: Behavior normal.     Diabetic Foot Exam - Simple   No data filed      Lab Results  Component Value Date   WBC 4.7 02/04/2022   HGB 11.2 02/04/2022   HCT 36.8 02/04/2022   PLT 81 (LL) 02/04/2022   GLUCOSE 195 (H) 01/06/2022   CHOL 111 01/06/2022   TRIG 120 01/06/2022   HDL 35 (L) 01/06/2022   LDLCALC 54 01/06/2022   ALT 25 01/06/2022   AST 29 01/06/2022   NA 142 01/06/2022   K 4.2 01/06/2022   CL 101 01/06/2022   CREATININE 1.05 (H) 01/06/2022   BUN 10 01/06/2022   CO2 27 01/06/2022   TSH 4.080 01/06/2022   HGBA1C 7.4 (H) 01/06/2022   MICROALBUR 30 10/09/2019      Assessment & Plan:  Cellulitis of foot  Take Augmentin 875 mg twice daily x 7 days. Podiatry appointment at 1:15 today with Dr. Loel Lofty.   If unable to be seen there, patient to go get foot x-ray to rule out osteomyelitis. Checking blood count and uric acid. Rocephin shot given.   Severe episode of recurrent major depressive disorder, with psychotic features ([place]) Increase vraylar to 3 mg daily.   Call [facility]  [phone] for therapy and psychiatric care.  Also may call, [facility] for grief counseling.  [phone].  Type 2 diabetes mellitus with hyperglycemia (HCC) Uncontrolled Increase  mounjaro to 5 mg weekly.    Orders Placed This Encounter  Procedures   DG Foot Complete Right   CBC with Differential/Platelet   Uric acid     Follow-up: Return in about 3 days (around 02/07/2022) for acute.  An After Visit Summary was printed and given to the patient.   I,Lauren M Auman,acting as a scribe for [name], MD.,have documented all relevant documentation on the behalf of [name], MD,as directed by  [name], MD while in the presence of [name], MD.    [name], MD [place] [phone]

## 2022-02-05 LAB — CBC WITH DIFFERENTIAL/PLATELET
Basophils Absolute: 0 x10E3/uL (ref 0.0–0.2)
Basos: 1 %
EOS (ABSOLUTE): 0.1 x10E3/uL (ref 0.0–0.4)
Eos: 2 %
Hematocrit: 36.8 % (ref 34.0–46.6)
Hemoglobin: 11.2 g/dL (ref 11.1–15.9)
Immature Grans (Abs): 0 x10E3/uL (ref 0.0–0.1)
Immature Granulocytes: 0 %
Lymphocytes Absolute: 1.3 x10E3/uL (ref 0.7–3.1)
Lymphs: 27 %
MCH: 26.2 pg — ABNORMAL LOW (ref 26.6–33.0)
MCHC: 30.4 g/dL — ABNORMAL LOW (ref 31.5–35.7)
MCV: 86 fL (ref 79–97)
Monocytes Absolute: 0.3 x10E3/uL (ref 0.1–0.9)
Monocytes: 6 %
Neutrophils Absolute: 3 x10E3/uL (ref 1.4–7.0)
Neutrophils: 64 %
Platelets: 81 x10E3/uL — CL (ref 150–450)
RBC: 4.28 x10E6/uL (ref 3.77–5.28)
RDW: 14.5 % (ref 11.7–15.4)
WBC: 4.7 x10E3/uL (ref 3.4–10.8)

## 2022-02-05 LAB — URIC ACID: Uric Acid: 5.4 mg/dL (ref 3.0–7.2)

## 2022-02-06 DIAGNOSIS — L97511 Non-pressure chronic ulcer of other part of right foot limited to breakdown of skin: Secondary | ICD-10-CM | POA: Diagnosis not present

## 2022-02-06 DIAGNOSIS — M7989 Other specified soft tissue disorders: Secondary | ICD-10-CM | POA: Diagnosis not present

## 2022-02-06 DIAGNOSIS — L0390 Cellulitis, unspecified: Secondary | ICD-10-CM | POA: Diagnosis not present

## 2022-02-06 DIAGNOSIS — I10 Essential (primary) hypertension: Secondary | ICD-10-CM | POA: Diagnosis not present

## 2022-02-06 DIAGNOSIS — E11621 Type 2 diabetes mellitus with foot ulcer: Secondary | ICD-10-CM | POA: Diagnosis not present

## 2022-02-07 ENCOUNTER — Ambulatory Visit: Payer: PPO | Admitting: Family Medicine

## 2022-02-07 ENCOUNTER — Ambulatory Visit: Payer: Self-pay | Admitting: Licensed Clinical Social Worker

## 2022-02-07 NOTE — Patient Instructions (Signed)
Visit Information  Thank you for taking time to visit with me today. Please don't hesitate to contact me if I can be of assistance to you before our next scheduled telephone appointment.  Following are the goals we discussed today:  Our next appointment is by telephone on 02/21/22 in the afternoon.   Please call the care guide team at [phone] if you need to cancel or reschedule your appointment.   If you are experiencing a Mental Health or [place] or need someone to talk to, please go to [facility] [place] [phone])   Following is a copy of your full plan of care:   Interventions: Patient is interested in program support . She spoke of medical needs and transport needs Discussed RN support with program. Client is interested in having RN call her to discuss nursing needs of client Client discussed transport needs. Her vehicle may be in need of mechanical repairs. She drives vehicle on short trips. Reviewed family support. Client has reduced family support Reviewed ambulation. Client is at risk for falls. She has had several falls in the past year. She uses rollator walker to help her ambulate Discussed her support from PCP and from podiatrist. Reviewed medication procurement. Reviewed mood issues of client. Provided counseling support for client. Client is taking prescribed medications for her mood.  Client agreed for LCSW to call her back on 02/21/22 in the afternoon to further discuss her SW needs LCSW to contact Care Guide scheduler to see if Care Guide can schedule RN to call client in next 2 weeks  Ms. Puryear was given information about Care Management services by the embedded care coordination team including:  Care Management services include personalized support from designated clinical staff supervised by her physician, including individualized plan of care and coordination with other care  providers 24/7 contact phone numbers for assistance for urgent and routine care needs. The patient may stop CCM services at any time (effective at the end of the month) by phone call to the office staff.  Patient agreed to services and verbal consent obtained.   Norva Riffle.Normon Pettijohn MSW, [place] Holiday representative [facility] Care Management [phone]

## 2022-02-07 NOTE — Patient Outreach (Signed)
  Care Coordination   Initial Visit Note   02/07/2022 Name: Linda [name] MRN: [id number] DOB: [birth year]  Linda [name] is a 52 y.o. year old female who sees [name], Linda Maxwell, MD for primary care. I spoke with  Linda [name] by phone today.  What matters to the patients health and wellness today? Client is interested in program support. She spoke of medical needs and transport needs.    Goals Addressed               This Visit's Progress     patient is interested in program support. she spoke of medical needs and transport needs (pt-stated)        Interventions: Patient is interested in program support . She spoke of medical needs and transport needs Discussed RN support with program. Client is interested in having RN call her to discuss nursing needs of client Client discussed transport needs. Her vehicle may be in need of mechanical repairs. She drives vehicle on short trips. Reviewed family support. Client has reduced family support Reviewed ambulation. Client is at risk for falls. She has had several falls in the past year. She uses rollator walker to help her ambulate Discussed her support from PCP and from podiatrist. Reviewed medication procurement. Reviewed mood issues of client. Provided counseling support for client. Client is taking prescribed medications for her mood.  Client agreed for LCSW to call her back on 02/21/22 in the afternoon to further discuss her SW needs LCSW to contact Care Guide scheduler to see if Care Guide can schedule RN to call client in next 2 weeks      SDOH assessments and interventions completed:  Yes  SDOH Interventions Today    Flowsheet Row Most Recent Value  SDOH Interventions   Food Insecurity Interventions Other (Comment)  [risk for food scarcity]  Transportation Interventions Other (Comment)  [client said her car may be in need of mechanical repairs]  Depression Interventions/Treatment  Currently on Treatment   [client is taking prescribed medication for depression]        Care Coordination Interventions:  Yes, provided   Follow up plan: Follow up call scheduled for LCSW and client on 02/21/22 in the afternoon.    Encounter Outcome:  Pt. Visit Completed   Linda [name].Linda [name] MSW, Linda [name] Holiday representative [facility] Care Management [phone]

## 2022-02-07 NOTE — Telephone Encounter (Signed)
Discussed with patient at her appointment.

## 2022-02-09 ENCOUNTER — Telehealth: Payer: Self-pay

## 2022-02-09 NOTE — Telephone Encounter (Signed)
[place].  Coverage Determination Notice: Approval Of Medicare Prescription Drug Coverage. They approved Mounjaro 5 mg/0.5 mg [place].  Coverage Start Date: 02/09/2022 to 02/10/2023.

## 2022-02-10 ENCOUNTER — Ambulatory Visit: Payer: PPO | Admitting: Podiatry

## 2022-02-10 ENCOUNTER — Encounter: Payer: Self-pay | Admitting: Podiatry

## 2022-02-10 ENCOUNTER — Telehealth: Payer: Self-pay | Admitting: *Deleted

## 2022-02-10 VITALS — BP 105/65 | HR 91

## 2022-02-10 DIAGNOSIS — M79671 Pain in right foot: Secondary | ICD-10-CM | POA: Diagnosis not present

## 2022-02-10 DIAGNOSIS — B351 Tinea unguium: Secondary | ICD-10-CM | POA: Diagnosis not present

## 2022-02-10 DIAGNOSIS — L97522 Non-pressure chronic ulcer of other part of left foot with fat layer exposed: Secondary | ICD-10-CM | POA: Diagnosis not present

## 2022-02-10 DIAGNOSIS — L03031 Cellulitis of right toe: Secondary | ICD-10-CM | POA: Diagnosis not present

## 2022-02-10 DIAGNOSIS — E1165 Type 2 diabetes mellitus with hyperglycemia: Secondary | ICD-10-CM

## 2022-02-10 NOTE — Telephone Encounter (Signed)
   Telephone encounter was:  Unsuccessful.  02/10/2022 Name: Linda [name] MRN: [id number] DOB: 05/10/1970  Unsuccessful outbound call made today to assist with:  Food Insecurity  Outreach Attempt:  1st Attempt  A HIPAA compliant voice message was left requesting a return call.  Instructed patient to call back at [phone].  [place] [phone] [address] , [place] [address] Email : [email or web address]

## 2022-02-10 NOTE — Progress Notes (Signed)
Subjective:  Patient ID: Linda [name], female    DOB: 07-Dec-1970,  MRN: [id number]  Chistine Dematteo presents to clinic today for  follow up diabetic ulcer check.   Chief Complaint  Patient presents with   Diabetic Ulcer    Right foot diabetic ulcer hallux and 2nd toe, patient is having some pain, redness and swelling, TX: clindamycin   New problem(s):   Patient states her right 2nd toe got worse and redness increased on her right foot. She went to local ED and was prescribed Bactrim. She is taking both Bactrim and Clindamycin . She denies any fever, chills, night sweats, nausea or vomiting. She does have some discomfort in her right foot.  PCP is [name], Kirsten, MD.  Allergies  Allergen Reactions   Codeine Shortness Of Breath    Other reaction(s): SHOB   Augmentin [Amoxicillin-Pot Clavulanate] Diarrhea   Celecoxib Other (See Comments)    Unknown reaction Other reaction(s): Unknown   Ezetimibe     Other reaction(s): Unknown   Ezetimibe-Simvastatin Other (See Comments)    Unknown reaction   Propranolol     Other reaction(s): Unknown   Propranolol Hcl Other (See Comments)    Unknown reaction   Simvastatin     Other reaction(s): Unknown    Review of Systems: Negative except as noted in the HPI.  Objective:   Vitals:   02/10/22 1112  BP: 105/65  Pulse: 36 [address] Dr. G[place]as is a pleasant 52 y.o. female obese in NAD. AAO x 3.  Vascular Examination: CFT <3 seconds b/l LE. Faintly palpable DP pulses b/l LE. Faintly palpable PT pulse(s) b/l LE. Pedal hair present. No pain with calf compression b/l. Lower extremity skin temperature gradient within normal limits. No edema noted b/l LE. No cyanosis or clubbing noted b/l LE.  Neurological Examination: Protective sensation diminished with 10g monofilament b/l.  Dermatological Examination: Right 2nd digit with full thickness ulceration x 2 at distal tip of digit and plantar aspect of digit. No active drainage,  no odor. Digit is erythematous and bulbous at distal tip.  Left great toe is erythematous at DIPJ. Left 2nd digit is edematous and erythematous as well.  No interdigital macerations noted b/l LE.   Toenails left great toe, left 2nd toe, left 5th digit and digits 3-5 right footl elongated, discolored, dystrophic, thickened.             Musculoskeletal Examination: Muscle strength 5/5 to b/l LE. No pain, crepitus or joint limitation noted with ROM bilateral LE. 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  Radiographs: None  Assessment/Plan: 1. Cellulitis of second toe of right foot   2. Onychomycosis   3. Skin ulcer of third toe of left foot with fat layer exposed ([place])   4. Right foot pain   5. Type 2 diabetes mellitus with hyperglycemia, with long-term current use of insulin ([place])     MR TOES LEFT W WO CONTRAST MR TOES RIGHT W WO CONTRAST  -Patient was evaluated and treated. All patient's and/or POA's questions/concerns answered on today's visit. 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 Has had recent trip to local ED. Continue Clindamycin and Bactrim DS. May call Dr. Loel Lofty for any refills. Continue Betadine solution to right 2nd digit and left 3rd digit daily. Will have her follow up with Dr. Loel Lofty next week. She is to continue Darco shoe daily. -Discussed chronic ulcers and cellulitis/abscess of toes which have waxed  and waned. Recommended MRI to rule out any osteomyelitis of digits. Patient is in agreement. Order placed for MRI of toes. If osteomyelitis confirmed, we discussed treatment of osteomyelitis treatment options and she related understanding. She will discuss further planning with Dr. Loel Lofty at her next appointment. -Toenails 3-5 b/l, left great toe and left 2nd toe debrided in length and girth without iatrogenic bleeding with sterile nail nipper and dremel.  -Patient/POA to call  should there be question/concern in the interim.   Return in about 1 week (around 02/17/2022).  Marzetta Board, DPM

## 2022-02-11 ENCOUNTER — Telehealth: Payer: Self-pay | Admitting: *Deleted

## 2022-02-11 ENCOUNTER — Telehealth: Payer: Self-pay

## 2022-02-11 NOTE — Progress Notes (Cosign Needed Addendum)
Error

## 2022-02-11 NOTE — Telephone Encounter (Signed)
   Telephone encounter was:  Unsuccessful.  02/11/2022 Name: Nahjae Hoeg MRN: [id number] DOB: 1970-09-17  Unsuccessful outbound call made today to assist with:  Food Insecurity  Outreach Attempt:  2nd Attempt  A HIPAA compliant voice message was left requesting a return call.  Instructed patient to call back at [phone].  [place] [phone] [address] , [place] [address] Email : [email or web address]

## 2022-02-11 NOTE — Telephone Encounter (Signed)
   Telephone encounter was:  Successful.  02/11/2022 Name: Linda [name] MRN: [id number] DOB: 1970/10/21  Linda [name] is a 52 y.o. year old female who is a primary care patient of [name], Kirsten, MD . The community resource team was consulted for assistance with [place] guide performed the following interventions: Patient provided with information about care guide support team and interviewed to confirm resource needs. Provided information on food banks and low income energy assistance  Follow Up Plan:   [place] [phone] [address] , [place] [address] Email : [email or web address]

## 2022-02-12 DIAGNOSIS — F333 Major depressive disorder, recurrent, severe with psychotic symptoms: Secondary | ICD-10-CM | POA: Insufficient documentation

## 2022-02-12 NOTE — Assessment & Plan Note (Addendum)
  Take Augmentin 875 mg twice daily x 7 days. Podiatry appointment at 1:15 today with Dr. Loel Lofty.   If unable to be seen there, patient to go get foot x-ray to rule out osteomyelitis. Checking blood count and uric acid. Rocephin shot given.

## 2022-02-12 NOTE — Assessment & Plan Note (Addendum)
Increase vraylar to 3 mg daily.   Call [facility]  [phone] for therapy and psychiatric care.  Also may call, [facility] for grief counseling.  [phone].

## 2022-02-12 NOTE — Assessment & Plan Note (Addendum)
Uncontrolled Increase mounjaro to 5 mg weekly.

## 2022-02-14 ENCOUNTER — Telehealth: Payer: Self-pay

## 2022-02-14 ENCOUNTER — Other Ambulatory Visit: Payer: Self-pay

## 2022-02-14 ENCOUNTER — Ambulatory Visit (INDEPENDENT_AMBULATORY_CARE_PROVIDER_SITE_OTHER): Payer: PPO | Admitting: Podiatry

## 2022-02-14 DIAGNOSIS — E11621 Type 2 diabetes mellitus with foot ulcer: Secondary | ICD-10-CM

## 2022-02-14 DIAGNOSIS — L97512 Non-pressure chronic ulcer of other part of right foot with fat layer exposed: Secondary | ICD-10-CM | POA: Diagnosis not present

## 2022-02-14 DIAGNOSIS — F5081 Binge eating disorder: Secondary | ICD-10-CM

## 2022-02-14 DIAGNOSIS — E1165 Type 2 diabetes mellitus with hyperglycemia: Secondary | ICD-10-CM

## 2022-02-14 DIAGNOSIS — L03031 Cellulitis of right toe: Secondary | ICD-10-CM

## 2022-02-14 DIAGNOSIS — Z794 Long term (current) use of insulin: Secondary | ICD-10-CM

## 2022-02-14 DIAGNOSIS — L97522 Non-pressure chronic ulcer of other part of left foot with fat layer exposed: Secondary | ICD-10-CM

## 2022-02-14 DIAGNOSIS — Z1501 Genetic susceptibility to malignant neoplasm of breast: Secondary | ICD-10-CM

## 2022-02-14 MED ORDER — LISDEXAMFETAMINE DIMESYLATE 70 MG PO CAPS: 70.0000 mg | ORAL_CAPSULE | Freq: Every morning | ORAL | 0 refills | Status: DC

## 2022-02-14 MED ORDER — ATORVASTATIN CALCIUM 10 MG PO TABS: 10.0000 mg | ORAL_TABLET | Freq: Every day | ORAL | 0 refills | Status: DC

## 2022-02-14 MED ORDER — MORPHINE SULFATE ER 30 MG PO TBCR: 30.0000 mg | EXTENDED_RELEASE_TABLET | Freq: Two times a day (BID) | ORAL | 0 refills | Status: DC

## 2022-02-14 MED ORDER — PROCHLORPERAZINE MALEATE 5 MG PO TABS: 5.0000 mg | ORAL_TABLET | Freq: Four times a day (QID) | ORAL | 2 refills | Status: DC | PRN

## 2022-02-14 MED ORDER — LOSARTAN POTASSIUM 100 MG PO TABS: 100.0000 mg | ORAL_TABLET | Freq: Every day | ORAL | 0 refills | Status: DC

## 2022-02-14 MED ORDER — PANTOPRAZOLE SODIUM 40 MG PO TBEC: 40.0000 mg | DELAYED_RELEASE_TABLET | Freq: Two times a day (BID) | ORAL | 1 refills | Status: DC

## 2022-02-14 NOTE — Telephone Encounter (Signed)
  [place] [employer] Electronic Prior Authorization Form 2017 NCPDP Original Claim Info [id number] Approved on January 17 17-JAN-24:17-JAN-25 Mounjaro '5MG'$ /0.5ML [place] SOPN Quantity:2;

## 2022-02-14 NOTE — Progress Notes (Signed)
Subjective:  Patient ID: Linda [name], female    DOB: May 14, 1970,  MRN: [id number]  Chief Complaint  Patient presents with   Wound Check    Ulcer to 2nd toe on right foot. Patient is currently taking Clydamicin for cellulitis.     52 y.o. female presents for follow-up of right second toe ulceration.  She saw Dr. Adah Perl last week due to concern for increased redness swelling and ulceration on the right second toe.  Dr. Adah Perl recommended MRI of the toes of bilateral foot related to this ulceration and concern for underlying infection despite negative prior x-ray imaging.  Patient denies nausea vomiting fever chills.  She does have erythema of the second toe on the right foot however thinks it is improving from prior.  Denies any drainage to be doing daily local wound care with Betadine dressings.  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 No longer has    Allergies  Allergen Reactions    Codeine Shortness Of Breath    Other reaction(s): SHOB   Augmentin [Amoxicillin-Pot Clavulanate] Diarrhea   Celecoxib Other (See Comments)    Unknown reaction Other reaction(s): Unknown   Ezetimibe     Other reaction(s): Unknown   Ezetimibe-Simvastatin Other (See Comments)    Unknown reaction   Propranolol     Other reaction(s): Unknown   Propranolol Hcl Other (See Comments)    Unknown reaction   Simvastatin     Other reaction(s): Unknown    ROS: Negative except as per HPI above  Objective:  General: AAO x3, NAD  Dermatological: Attention directed to the hallux and second toe of the right foot there are noted to be open wounds with eschars present.  Decreased erythema and edema of the right 2nd toe from prior. Ulcer measures approx 1 x0.7x0.2 cm post debridement. No active drainage.  Mild edema and erythema of the left foot 2nd and 3rd toes.         Vascular:  Dorsalis Pedis artery and Posterior Tibial artery pedal pulses are 2/4 bilateral.  Capillary fill time < 3 sec to all digits.   Neruologic: Grossly  diminished via light touch to the digits bilateral foot protective sensation is absent.   Musculoskeletal: Hammertoe deformity of the second toe right foot  Gait: Unassisted, Nonantalgic.     Radiographs:  Date: 02/04/2022 XR right  foot weightbearing AP/Lateral/Oblique   Findings: no soft tissue emphysema, no signs of osteomyelitis about the second toe of the right foot Assessment:   1. Right foot ulcer, with fat layer exposed ([place])   2. Cellulitis of second toe of right foot   3. Type 2 diabetes mellitus with hyperglycemia, with long-term current use of insulin ([place])   4. Diabetic ulcer of toe of left foot associated with type 2 diabetes mellitus, with fat layer exposed ([place])       Plan:  Patient was evaluated and treated and all questions answered.  Ulcer distal tuft of the second toe right foot with associated cellulitis, slightly improved from prior  however still with erythema of the second toe -We discussed the etiology and factors that are a part of the wound healing process.  We also discussed the risk of infection both soft tissue and osteomyelitis from open ulceration.  Discussed the risk of limb loss if this happens or worsens. -Debridement as below. -Dressed with betadine, DSD. -Continue home dressing changes daily with betadine and dry gauze -Continue off-loading with surgical shoe. -Vascular testing deferred, pt has strong pedal pulses -Last antibiotics: Continue clindamycin 300 mg 3 times daily until her prescription is done with -Imaging: Patient has MRI of the toes bilateral foot ordered and is scheduled to have this completed this coming Friday -Patient will follow-up with me in 8 days for review of the MRI results and we will discuss further treatment from there.  If there is concern for osseous infection in the second toe on the right foot I discussed we will have to discuss amputation of a portion or the total toe at that time.  Procedure: Excisional Debridement of Wound distal tuft of the second toe right foot Rationale: Removal of non-viable soft tissue from the wound to promote healing.  Anesthesia: none Post-Debridement Wound Measurements: 1 cm x 0.7 cm x 0.2 cm  Type of Debridement: Sharp Excisional Tissue Removed: Non-viable soft tissue Depth of Debridement: subcutaneous tissue. Technique: Sharp excisional debridement to bleeding, viable wound base.  Dressing: Dry, sterile, compression dressing. Disposition: Patient tolerated procedure well.      Return in about 8 days (around 02/22/2022) for F/u MRI toes bilateral, R 2nd toe ulcer.          Everitt Amber, DPM [facility]

## 2022-02-15 NOTE — Telephone Encounter (Signed)
Compliant on meds 

## 2022-02-17 ENCOUNTER — Other Ambulatory Visit: Payer: Self-pay | Admitting: Family Medicine

## 2022-02-18 DIAGNOSIS — L03115 Cellulitis of right lower limb: Secondary | ICD-10-CM | POA: Diagnosis not present

## 2022-02-18 DIAGNOSIS — L03031 Cellulitis of right toe: Secondary | ICD-10-CM | POA: Diagnosis not present

## 2022-02-18 DIAGNOSIS — M86172 Other acute osteomyelitis, left ankle and foot: Secondary | ICD-10-CM | POA: Diagnosis not present

## 2022-02-18 DIAGNOSIS — M86171 Other acute osteomyelitis, right ankle and foot: Secondary | ICD-10-CM | POA: Diagnosis not present

## 2022-02-18 DIAGNOSIS — L03116 Cellulitis of left lower limb: Secondary | ICD-10-CM | POA: Diagnosis not present

## 2022-02-18 DIAGNOSIS — R600 Localized edema: Secondary | ICD-10-CM | POA: Diagnosis not present

## 2022-02-21 ENCOUNTER — Telehealth: Payer: Self-pay | Admitting: *Deleted

## 2022-02-21 ENCOUNTER — Ambulatory Visit: Payer: PPO | Admitting: Podiatry

## 2022-02-21 ENCOUNTER — Ambulatory Visit: Payer: Self-pay | Admitting: Licensed Clinical Social Worker

## 2022-02-21 ENCOUNTER — Other Ambulatory Visit: Payer: Self-pay | Admitting: Podiatry

## 2022-02-21 NOTE — Patient Outreach (Signed)
  Care Coordination   Follow Up Visit Note   02/21/2022 Name: Linda [name] MRN: [id number] DOB: [birth year]  Linda [name] is a 52 y.o. year old female who sees [name], Linda Maxwell, MD for primary care. I spoke with  Linda [name] by phone today.  What matters to the patients health and wellness today?  Client interested in program support. She spoke of medical needs, transport needs and food needs.  Care Guide has provided Linda [name] with list of [employer] in area.    Goals Addressed               This Visit's Progress     patient is interested in program support. she spoke of medical needs and transport needs (pt-stated)        Interventions:  Patient is interested in program support . She spoke of medical needs and transport needs.  She feels that program support would be helpful to her at this time.  She said she is having reduced family support at present Client discussed transport needs. Her vehicle may be in need of mechanical repairs. She drives vehicle on short trips.LCSW encouraged client to put her name on [facility] transport list for [name].   Reviewed ambulation of client. Client is at risk for falls.  She uses rollator walker to help her ambulate Discussed her support from PCP and from podiatrist. Client is concerned about medical care for her feet. She is taking antibiotic currently for her toe.  She said she is elevating her feet at night.  She has her next appointment with Podiatrist on Febrouary 5, 2024. Reviewed mood issues of client.  Client said she has been sad and more depressed recently concerned about medical care for her feet and toes. Client is taking medications as prescribed Provided counseling support for client.  LCSW encouraged Linda [name] to talk regularly with her PCP and Podiatrist regarding client foot/toe care needs Reviewed financial needs. She receives monthly Disability check.  Encouraged client to contact local Food Banks for food  support        SDOH assessments and interventions completed:  Yes  SDOH Interventions Today    Flowsheet Row Most Recent Value  SDOH Interventions   Depression Interventions/Treatment  Medication, Counseling  [depressed over medical needs management]  Physical Activity Interventions Other (Comments)  [fall risk,  she uses rollator walker to help her ambulate]  Stress Interventions Provide Counseling  [client has stress in managing medical needs]        Care Coordination Interventions:  Yes, provided   Follow up plan: Follow up call scheduled for 03/21/22 at 3:30 PM     Encounter Outcome:  Pt. Visit Completed   Linda [name].Linda [name] MSW, [place] Holiday representative [facility] Care Management [phone]

## 2022-02-21 NOTE — Telephone Encounter (Signed)
Linda [name] w/ [facility]) is calling with report of MRI of the B/l toes,(reactive osteomyelitis,)report will be ready to view in epic.

## 2022-02-21 NOTE — Telephone Encounter (Incomplete Revision)
Opal Sidles w/ [facility] is calling with report of MRI of the B/l toes,(reactive osteomyelitis,)report will be ready to view in epic.

## 2022-02-21 NOTE — Patient Instructions (Signed)
Visit Information  Thank you for taking time to visit with me today. Please don't hesitate to contact me if I can be of assistance to you before our next scheduled telephone appointment.  Following are the goals we discussed today:   Our next appointment is by telephone on 03/21/22 at 3:30 PM   Please call the care guide team at [phone] if you need to cancel or reschedule your appointment.   If you are experiencing a Mental Health or [place] or need someone to talk to, please go to [facility] [place] [phone])   Following is a copy of your full plan of care:   Interventions:  Patient is interested in program support . She spoke of medical needs and transport needs.  She feels that program support would be helpful to her at this time.  She said she is having reduced family support at present Client discussed transport needs. Her vehicle may be in need of mechanical repairs. She drives vehicle on short trips.LCSW encouraged client to put her name on [facility] transport list for [place], [place] area.   Reviewed ambulation of client. Client is at risk for falls.  She uses rollator walker to help her ambulate Discussed her support from PCP and from podiatrist. Client is concerned about medical care for her feet. She is taking antibiotic currently for her toe.  She said she is elevating her feet at night.  She has her next appointment with Podiatrist on Febrouary 5, 2024. Reviewed mood issues of client.  Client said she has been sad and more depressed recently concerned about medical care for her feet and toes. Client is taking medications as prescribed Provided counseling support for client.  LCSW encouraged Linda [name] to talk regularly with her PCP and Podiatrist regarding client foot/toe care needs Reviewed financial needs. She receives monthly Disability check.  Encouraged client to contact local Food Banks for food  support  Linda [name] was given information about Care Management services by the embedded care coordination team including:  Care Management services include personalized support from designated clinical staff supervised by her physician, including individualized plan of care and coordination with other care providers 24/7 contact phone numbers for assistance for urgent and routine care needs. The patient may stop CCM services at any time (effective at the end of the month) by phone call to the office staff.  Patient agreed to services and verbal consent obtained.   Linda [name].Linda [name] MSW, [place] Holiday representative [facility] Care Management [phone]

## 2022-02-22 NOTE — Telephone Encounter (Signed)
Results are in the patients media tab

## 2022-02-23 ENCOUNTER — Other Ambulatory Visit: Payer: Self-pay

## 2022-02-23 DIAGNOSIS — E785 Hyperlipidemia, unspecified: Secondary | ICD-10-CM | POA: Diagnosis not present

## 2022-02-23 DIAGNOSIS — F32A Depression, unspecified: Secondary | ICD-10-CM | POA: Diagnosis not present

## 2022-02-23 DIAGNOSIS — E1159 Type 2 diabetes mellitus with other circulatory complications: Secondary | ICD-10-CM | POA: Diagnosis not present

## 2022-02-23 DIAGNOSIS — I10 Essential (primary) hypertension: Secondary | ICD-10-CM

## 2022-02-23 DIAGNOSIS — Z1501 Genetic susceptibility to malignant neoplasm of breast: Secondary | ICD-10-CM

## 2022-02-23 DIAGNOSIS — Z794 Long term (current) use of insulin: Secondary | ICD-10-CM

## 2022-02-23 MED ORDER — VITAMIN D (ERGOCALCIFEROL) 1.25 MG (50000 UNIT) PO CAPS: ORAL_CAPSULE | ORAL | 1 refills | Status: DC

## 2022-02-28 ENCOUNTER — Other Ambulatory Visit: Payer: Self-pay

## 2022-02-28 ENCOUNTER — Ambulatory Visit (INDEPENDENT_AMBULATORY_CARE_PROVIDER_SITE_OTHER): Payer: PPO

## 2022-02-28 ENCOUNTER — Encounter: Payer: Self-pay | Admitting: Hematology and Oncology

## 2022-02-28 ENCOUNTER — Ambulatory Visit (INDEPENDENT_AMBULATORY_CARE_PROVIDER_SITE_OTHER): Payer: PPO | Admitting: Podiatry

## 2022-02-28 DIAGNOSIS — L03119 Cellulitis of unspecified part of limb: Secondary | ICD-10-CM

## 2022-02-28 DIAGNOSIS — E1142 Type 2 diabetes mellitus with diabetic polyneuropathy: Secondary | ICD-10-CM

## 2022-02-28 DIAGNOSIS — S91302A Unspecified open wound, left foot, initial encounter: Secondary | ICD-10-CM

## 2022-02-28 DIAGNOSIS — M869 Osteomyelitis, unspecified: Secondary | ICD-10-CM

## 2022-02-28 DIAGNOSIS — L97522 Non-pressure chronic ulcer of other part of left foot with fat layer exposed: Secondary | ICD-10-CM

## 2022-02-28 DIAGNOSIS — L03031 Cellulitis of right toe: Secondary | ICD-10-CM | POA: Diagnosis not present

## 2022-02-28 MED ORDER — CARIPRAZINE HCL 3 MG PO CAPS: 3.0000 mg | ORAL_CAPSULE | Freq: Every day | ORAL | 1 refills | Status: DC

## 2022-02-28 NOTE — Progress Notes (Signed)
Subjective:  Patient ID: Linda [name], female    DOB: 23-Sep-1970,  MRN: [id number]  Chief Complaint  Patient presents with   Follow-up    MRI toes bilateral, Right 2nd toe ulcer. Patient is cleansing area with antibacterial soap and betadine.    52 y.o. female presents for follow-up of right second toe ulceration.  Also concern for infection in the first through third toes on the left foot.  Patient has had longstanding redness and swelling of the toes on both feet.  She has previously been treated by Dr. Cannon Kettle in the past for the left great toe which had a wound but is since healed.  She has noted the redness and swelling is never fully gone away and always been an issue for her since that episode roughly 1 to 2 years ago.  Additionally the redness on the right second toe is not improved from prior after antibiotic therapy.  MRI was ordered of the bilateral toes.  Patient is here to discuss the MRI results and possible options for treatment.  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 No longer has    Allergies  Allergen Reactions   Codeine Shortness Of Breath    Other reaction(s): SHOB   Augmentin [Amoxicillin-Pot Clavulanate] Diarrhea   Celecoxib Other (See Comments)    Unknown reaction Other reaction(s): Unknown   Ezetimibe     Other reaction(s): Unknown   Ezetimibe-Simvastatin Other (See Comments)    Unknown reaction   Propranolol     Other reaction(s): Unknown   Propranolol Hcl Other (See Comments)    Unknown reaction   Simvastatin     Other reaction(s): Unknown    ROS: Negative except as per HPI above  Objective:  General: AAO x3, NAD  Dermatological: Attention directed to the hallux and second toe of the right foot there are noted to be open wounds with eschars present.  With erythema and edema of the distal portion of the second toe. No active drainage.    Left foot third toe does have a small superficial ulceration at the distal tuft of the toe without any active drainage but there is erythema and edema of the first second and third distal digits.              Vascular:  Dorsalis Pedis artery and Posterior Tibial artery  pedal pulses are 2/4 bilateral.  Capillary fill time < 3 sec to all digits.   Neruologic: Grossly  diminished via light touch to the digits bilateral foot protective sensation is absent.   Musculoskeletal: Hammertoe deformity of the second toe right foot  Gait: Unassisted, Nonantalgic.     Radiographs:  Date: 02/04/2022 XR right foot weightbearing AP/Lateral/Oblique   Findings: no soft tissue emphysema, no signs of osteomyelitis about the second toe of the right foot  XR left foot weightbearing AP lateral oblique Date: 02/28/2022 Endings: Attention directed to the distal phalanx of the left hallux there is no to be possible osteolysis and erosions in the distal phalanx questionable morphology and erosion of the distal phalanx  of the second toe which is very flattened and narrowed as well as in the third toe possibly erosions distally.  No soft tissue emphysema is noted   MRI R toes with and without contrast: 02/18/2022 1.  Acute osteomyelitis of the distal phalanx of the second toe 2.  Mild bone marrow edema within the middle phalanx of the second toe and distal phalanx of the great toe with preserved T1 bone marrow signal findings may represent reactive osteitis versus early acute osteomyelitis 3.  Soft tissue swelling and edema at the distal aspect of the second toe compatible with cellulitis no fluid collections   MRI of the left toes with and without contrast 02/18/2022 1.  Acute osteomyelitis of the proximal phalanx and distal phalanx of the left great toe 2.  New erosion of the distal phalanx of the third toe compatible with osteomyelitis this is favored to be subacute Soft tissue swelling and edema of the great toe and at the distal aspect of the second and third toes no organized fluid collections  Assessment:   1. Osteomyelitis of toe of right foot ([place])   2. Osteomyelitis of toe of left foot ([place])   3. Skin ulcer of toe of left foot with fat layer exposed ([place])   4. Cellulitis of second toe of right foot   5. DM type 2 with diabetic peripheral neuropathy ([place])     Plan:  Patient was evaluated and treated and all questions answered.  #Ulcer distal tuft of the first and second toe right foot with associated cellulitis, Concern for underlying osteomyelitis # Erythema edema concern for underlying osteomyelitis of the left foot first second and third distal phalanx -Overall discussed with the patient that regarding these MRI findings there is concern for osteomyelitis in multiple toes on both feet. -Discussed possible conservative versus surgical treatment options regarding this osteomyelitis on both feet.  I feel she is unlikely to have successful treatment with long-term IV therapy which she has already  been attempted for the left foot and failed -In regards to the right foot the plan for foot to be for amputation of the right second toe at the MPJ level and amputation of the right hallux at the mid proximal phalanx level -In regards to the left foot would recommend amputation of the hallux likely at the MPJ level given concern for osteomyelitis in the distal and proximal phalanx.  Also with plan for partial amputation at the interphalangeal joint level of the left second and third toes.  Despite the second toe not having evidence of osteomyelitis on the MRI I do believe there is concern for infection given the dactylitis and clinical prolonged erythema and edema of the distal aspect of the second toe. -Patient is agreeable and would like to proceed with  surgical intervention she understands of the risk of nonhealing of the amputation sites need for further surgery and progression to major limb amputation should she fail to heal the amputation sites. -Discussed the risk benefits alternatives and possible complications in detail patient understands and informed surgical consent was obtained she wishes to proceed with surgery.  Will try to set this up on an outpatient basis through the hospital. -In the meantime I prescribed the patient clindamycin 300 mg take 3 times daily for the next 10 days for prophylaxis against worsening infection -Recommend continued local wound care with Betadine and bandage dressings to any open wounds present on the right second toe and the left third toe.   Return for after OR.          Everitt Amber, DPM [facility] / [facility]

## 2022-03-01 ENCOUNTER — Encounter: Payer: Self-pay | Admitting: Family Medicine

## 2022-03-01 ENCOUNTER — Ambulatory Visit (INDEPENDENT_AMBULATORY_CARE_PROVIDER_SITE_OTHER): Payer: PPO | Admitting: Family Medicine

## 2022-03-01 VITALS — BP 124/74 | HR 95 | Temp 97.3°F | Ht 62.0 in | Wt 207.0 lb

## 2022-03-01 DIAGNOSIS — K7460 Unspecified cirrhosis of liver: Secondary | ICD-10-CM

## 2022-03-01 DIAGNOSIS — K7581 Nonalcoholic steatohepatitis (NASH): Secondary | ICD-10-CM

## 2022-03-01 DIAGNOSIS — Z01818 Encounter for other preprocedural examination: Secondary | ICD-10-CM | POA: Diagnosis not present

## 2022-03-01 DIAGNOSIS — D693 Immune thrombocytopenic purpura: Secondary | ICD-10-CM | POA: Diagnosis not present

## 2022-03-01 NOTE — Progress Notes (Unsigned)
Subjective:  Patient ID: Linda [name], female    DOB: 09-05-1970  Age: 52 y.o. MRN: [id number]  Chief Complaint  Patient presents with   Surgical Clearance    HPI   Chief Complaint  Patient presents with   Surgical Clearance    Linda [name]  is here for a Pre-operative physical at the request of Dr. Loel Lofty.   She  is having toe amputation surgery on 03/04/2022. Personal or family hx of adverse outcome to anesthesia? Yes  Difficulty waking up. Chipped, cracked, missing, or loose teeth? No  Decreased ROM of neck? No  Able to walk up 2 flights of stairs without becoming significantly short of breath or having chest pain? Yes  Last A1C 7.4  Patient Active Problem List   Diagnosis Date Noted   Severe episode of recurrent major depressive disorder, with psychotic features ([place]) 02/12/2022   Scabies infestation 01/06/2022   Pulmonary nodule 12/30/2021   Cellulitis of foot 10/28/2021   Back pain of lumbar region with sciatica 10/01/2021   Erythema of foot 10/01/2021   Right hip pain 06/27/2021   Left hip pain 06/27/2021   Falls frequently 06/23/2021   Weakness of both lower extremities 06/23/2021   Hypertension complicating diabetes ([place]) 03/23/2021   Type 2 diabetes mellitus with hyperglycemia ([place]) 01/20/2021   Diabetic glomerulopathy ([place]) 11/23/2020   Binge eating disorder 11/23/2020   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 04/17/2020   Idiopathic thrombocytopenic purpura (ITP) ([place]) 01/27/2020   Malignant neoplasm of lower-inner quadrant of right breast of female, estrogen receptor negative ([place]) 10/01/2019   Chronic pain syndrome 07/08/2019   Depression, major, recurrent, mild ([place]) 56/31/4970   Uncomplicated opioid dependence ([place]) 07/08/2019   Mixed hyperlipidemia 04/11/2019   Dyslipidemia associated with type 2 diabetes mellitus ([place]) 04/11/2019   Acquired hypothyroidism 04/11/2019   Vitamin D insufficiency 04/11/2019   Class 2 severe obesity due to excess  calories with serious comorbidity and body mass index (BMI) of 38.0 to 38.9 in adult ([place]) 04/11/2019   Liver cirrhosis secondary to NASH (nonalcoholic steatohepatitis) ([place]) 09/13/2018   BRCA1 positive 06/14/2016   Past Medical History:  Diagnosis Date   Acute postoperative respiratory insufficiency 04/17/2020   AKI (acute kidney injury) ([place]) 04/17/2020   Anemia    Anemia, unspecified    Anemia, unspecified    Anemia, unspecified    Anxiety    Arthritis    BRCA gene mutation positive    Chronic pain syndrome    Depression    Diabetes mellitus without complication ([place])    type 2   Diabetic ulcer of toe of left foot associated with type 2 diabetes mellitus, with fat layer exposed ([place]) 03/27/2020   Gastritis    Genetic susceptibility to malignant neoplasm of breast    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 No longer has    Past Surgical History:  Procedure Laterality Date   APPENDECTOMY     BACK SURGERY     x 3 lower disc   BILATERAL  TOTAL MASTECTOMY WITH AXILLARY LYMPH NODE DISSECTION Bilateral 09/2019   CHOLECYSTECTOMY     nephrolithiasis     ROBOTIC ASSISTED TOTAL HYSTERECTOMY WITH BILATERAL SALPINGO OOPHERECTOMY Bilateral 06/14/2016   Procedure: ROBOTIC ASSISTED TOTAL HYSTERECTOMY WITH BILATERAL SALPINGO OOPHORECTOMY;  Surgeon: Nancy Marus, MD;  Location: [facility];  Service: Gynecology;  Laterality: Bilateral;    Current Outpatient Medications  Medication Sig Dispense Refill   albuterol (VENTOLIN HFA) 108 (90 Base) MCG/ACT inhaler Inhale 2 puffs  into the lungs every 6 (six) hours as needed for wheezing or shortness of breath. 8 g 1   atorvastatin (LIPITOR) 10 MG tablet Take 1 tablet (10 mg total) by mouth daily. 90 tablet 0   Blood Glucose Monitoring Suppl (ONETOUCH VERIO REFLECT) w/Device KIT AS DIRECTED     Blood Pressure Monitoring (SPHYGMOMANOMETER) MISC 1 each by Does not apply route daily in the afternoon. 1 each 0   buPROPion (WELLBUTRIN XL) 300 MG 24 hr tablet Take 1 tablet (300 mg total) by mouth every evening. 90 tablet 1   busPIRone (BUSPAR) 5 MG tablet Take 1 tablet (5 mg total) by mouth 3 (three) times daily. 90 tablet 3   calcium citrate-vitamin D (CITRACAL+D) 315-200 MG-UNIT tablet Take 1 tablet by mouth 2 (two) times daily.     cariprazine (VRAYLAR) 3 MG capsule Take 1 capsule (3 mg total) by mouth daily. 90 capsule 1   cetirizine (ZYRTEC) 10 MG tablet Take by mouth.     clindamycin (CLEOCIN) 300 MG capsule TAKE ONE CAPSULE BY MOUTH THREE TIMES DAILY FOR 10 DAYS 30 capsule 0   clotrimazole-betamethasone (LOTRISONE) cream Apply twice daily as needed to area of concern 45 g 2   Continuous Blood Gluc Receiver (FREESTYLE LIBRE 2 READER) DEVI E11.69 Check blood sugar 4 times daily as directed 1 each 0   Continuous Blood Gluc Sensor (FREESTYLE LIBRE 2 SENSOR) MISC E11.69 Change sensor every 14 days as directed 6 each 3   cyclobenzaprine (FLEXERIL) 10 MG tablet Take one tablet by mouth every 8 hours as needed for muscle spasms 270 tablet 1   dicyclomine (BENTYL) 20 MG tablet TAKE ONE TABLET BY MOUTH BEFORE MEALS AND AT BEDTIME AS NEEDED FOR STOMACH CRAMPING 180 tablet 1   famotidine (PEPCID) 20 MG tablet Take 1 tablet (20 mg total) by mouth 2 (two) times daily. 180 tablet 1   ferrous sulfate 325 (65 FE) MG tablet Take 325 mg by mouth every evening.     FETZIMA 80 MG CP24 TAKE ONE CAPSULE BY MOUTH EVERY EVENING 90 capsule 1   gabapentin (NEURONTIN) 300 MG capsule Take 2 capsules (600 mg total) by mouth 3 (three) times daily. 180  capsule 3   glucose blood (ONETOUCH VERIO) test strip Use as instructed 100 each 12   insulin degludec (TRESIBA FLEXTOUCH) 200 UNIT/ML FlexTouch Pen Inject 52 Units into the skin daily. 9 mL 3   Lancets (ONETOUCH DELICA PLUS [id number]) MISC USE TO check blood glucose 2-3 times daily AS DIRECTED 100 each 3   levothyroxine (SYNTHROID) 75 MCG tablet Take 1 tablet (75 mcg total) by mouth daily. 90 tablet 1   lisdexamfetamine (VYVANSE) 70 MG capsule Take 1 capsule (70 mg total) by mouth every morning. 30 capsule 0   losartan (COZAAR) 100 MG tablet Take 1 tablet (100 mg total) by mouth daily. 90 tablet 0   Magnesium 500 MG CAPS 500 mg.     morphine (MS CONTIN) 30 MG 12 hr tablet Take 1 tablet (30 mg total)  by mouth every 12 (twelve) hours. 60 tablet 0   Multiple Vitamin (MULTIVITAMIN WITH MINERALS) TABS tablet Take 1 tablet by mouth daily.      omega-3 acid ethyl esters (LOVAZA) 1 g capsule Take 2 capsules (2 g total) by mouth 2 (two) times daily. 360 capsule 1   ondansetron (ZOFRAN) 4 MG tablet Take 1 tablet (4 mg total) by mouth every 4 (four) hours as needed for nausea. 90 tablet 3   pantoprazole (PROTONIX) 40 MG tablet Take 1 tablet (40 mg total) by mouth 2 (two) times daily. 180 tablet 1   potassium chloride (MICRO-K) 10 MEQ CR capsule Take 2 capsules (20 mEq total) by mouth 2 (two) times daily. 360 capsule 3   prochlorperazine (COMPAZINE) 5 MG tablet Take 1 tablet (5 mg total) by mouth every 6 (six) hours as needed. 30 tablet 2   rOPINIRole (REQUIP) 0.25 MG tablet Take 1 tablet (0.25 mg total) by mouth at bedtime. 90 tablet 1   SYNJARDY 12.05-998 MG TABS TAKE ONE TABLET BY MOUTH TWICE DAILY 180 tablet 0   tirzepatide (MOUNJARO) 5 MG/0.5ML Pen Inject 5 mg into the skin once a week. 2 mL 0   Vitamin D, Ergocalciferol, (DRISDOL) 1.25 MG (50000 UNIT) CAPS capsule Take one capsule by mouth weekly 12 capsule 1   zolpidem (AMBIEN) 10 MG tablet One before bed 30 tablet 5   No current  facility-administered medications for this visit.   Facility-Administered Medications Ordered in Other Visits  Medication Dose Route Frequency Provider Last Rate Last Admin   sodium chloride flush (NS) 0.9 % injection 10 mL  10 mL Intracatheter PRN Mosher, Kelli A, PA-C   10 mL at 02/19/21 1004    Allergies  Allergen Reactions   Codeine Shortness Of Breath    Other reaction(s): SHOB   Augmentin [Amoxicillin-Pot Clavulanate] Diarrhea   Celecoxib Other (See Comments)    Unknown reaction Other reaction(s): Unknown   Ezetimibe     Other reaction(s): Unknown   Ezetimibe-Simvastatin Other (See Comments)    Unknown reaction   Propranolol     Other reaction(s): Unknown   Propranolol Hcl Other (See Comments)    Unknown reaction   Simvastatin     Other reaction(s): Unknown    Social History   Socioeconomic History   Marital status: Divorced    Spouse name: Not on file   Number of children: Not on file   Years of education: Not on file   Highest education level: Not on file  Occupational History   Occupation: disabled  Tobacco Use   Smoking status: Never   Smokeless tobacco: Never  Substance and Sexual Activity   Alcohol use: No   Drug use: No   Sexual activity: Yes  Other Topics Concern   Not on file  Social History Narrative   wears sunscreen, brushes and flosses daily, see's dentist bi-annually, has smoke/carbon monoxide detectors, wears a seatbelt and practices gun safety   Social Determinants of Health   Financial Resource Strain: Medium Risk (02/02/2022)   Overall Financial Resource Strain (CARDIA)    Difficulty of Paying Living Expenses: Somewhat hard  Food Insecurity: Food Insecurity Present (02/07/2022)   Hunger Vital Sign    Worried About Running Out of Food in the Last Year: Sometimes true    Ran Out of Food in the Last Year: Sometimes true  Transportation Needs: No Transportation Needs (02/07/2022)   PRAPARE - Hydrologist (Medical):  No  Lack of Transportation (Non-Medical): No  Recent Concern: Transportation Needs - Unmet Transportation Needs (01/19/2022)   PRAPARE - Transportation    Lack of Transportation (Medical): Yes    Lack of Transportation (Non-Medical): Yes  Physical Activity: Inactive (02/21/2022)   Exercise Vital Sign    Days of Exercise per Week: 0 days    Minutes of Exercise per Session: 0 min  Stress: Stress Concern Present (02/21/2022)   [place]    Feeling of Stress : Rather much  Social Connections: Moderately Isolated (02/02/2022)   Social Connection and Isolation Panel [NHANES]    Frequency of Communication with Friends and Family: More than three times a week    Frequency of Social Gatherings with Friends and Family: Once a week    Attends Religious Services: 1 to 4 times per year    Active Member of [employer] or Organizations: No    Attends Archivist Meetings: Never    Marital Status: Divorced  Human resources officer Violence: Not At Risk (02/02/2022)   Humiliation, Afraid, Rape, and Kick questionnaire    Fear of Current or Ex-Partner: No    Emotionally Abused: No    Physically Abused: No    Sexually Abused: No    Family History  Problem Relation Age of Onset   Cancer Mother        breast   CAD Father    Diabetes Father    Heart failure Father    Cancer Father        renal carcinoma   Kidney failure Father    Cancer Brother 25       renal carcinoma.   Stroke Paternal Grandmother          02/21/2022    3:16 PM 02/07/2022   11:47 AM 02/04/2022   11:45 AM 01/06/2022    7:59 AM 07/01/2021    2:19 PM  Depression screen PHQ 2/9  Decreased Interest '1 2 2 1 3  '$ Down, Depressed, Hopeless '1 2 3 2 1  '$ PHQ - 2 Score '2 4 5 3 4  '$ Altered sleeping '1 1 2 3 3  '$ Tired, decreased energy '2 2 2 3 3  '$ Change in appetite '2 2 3 3 3  '$ Feeling bad or failure about yourself  '1 2 3 3 3  '$ Trouble concentrating '1 1 3 3 3  '$ Moving slowly or  fidgety/restless 1 1 0 2 2  Suicidal thoughts 0 0 0 1 0  PHQ-9 Score '10 13 18 21 21  '$ Difficult doing work/chores Somewhat difficult Somewhat difficult Very difficult Very difficult Somewhat difficult         10/07/2021    3:00 PM 12/13/2021    2:24 PM 02/02/2022    1:53 PM 02/07/2022   11:42 AM 02/21/2022    3:12 PM  Fall Risk  Falls in the past year?   '1 1 1  '$ Was there an injury with Fall?   '1 1 1  '$ Fall Risk Category Calculator   '3 3 3  '$ Fall Risk Category (Retired)   High    (RETIRED) Patient Fall Risk Level Low fall risk Low fall risk High fall risk    Patient at Risk for Falls Due to   History of fall(s);Impaired balance/gait;Impaired mobility History of fall(s);Impaired balance/gait;Impaired mobility   Fall risk Follow up   Falls evaluation completed;Education provided;Falls prevention discussed        Review of Systems  Constitutional:  Negative for chills, fatigue  and fever.  HENT:  Negative for congestion, ear pain, rhinorrhea and sore throat.   Respiratory:  Negative for cough and shortness of breath.   Cardiovascular:  Negative for chest pain.  Gastrointestinal:  Negative for abdominal pain, constipation, diarrhea, nausea and vomiting.  Genitourinary:  Negative for dysuria and urgency.  Musculoskeletal:  Positive for arthralgias. Negative for back pain and myalgias.       Foot pain   Neurological:  Negative for dizziness, weakness, light-headedness and headaches.  Psychiatric/Behavioral:  Negative for dysphoric mood. The patient is not nervous/anxious.     Current Outpatient Medications on File Prior to Visit  Medication Sig Dispense Refill   albuterol (VENTOLIN HFA) 108 (90 Base) MCG/ACT inhaler Inhale 2 puffs into the lungs every 6 (six) hours as needed for wheezing or shortness of breath. 8 g 1   atorvastatin (LIPITOR) 10 MG tablet Take 1 tablet (10 mg total) by mouth daily. 90 tablet 0   Blood Glucose Monitoring Suppl (ONETOUCH VERIO REFLECT) w/Device KIT AS  DIRECTED     Blood Pressure Monitoring (SPHYGMOMANOMETER) MISC 1 each by Does not apply route daily in the afternoon. 1 each 0   buPROPion (WELLBUTRIN XL) 300 MG 24 hr tablet Take 1 tablet (300 mg total) by mouth every evening. 90 tablet 1   busPIRone (BUSPAR) 5 MG tablet Take 1 tablet (5 mg total) by mouth 3 (three) times daily. 90 tablet 3   calcium citrate-vitamin D (CITRACAL+D) 315-200 MG-UNIT tablet Take 1 tablet by mouth 2 (two) times daily.     cariprazine (VRAYLAR) 3 MG capsule Take 1 capsule (3 mg total) by mouth daily. 90 capsule 1   cetirizine (ZYRTEC) 10 MG tablet Take by mouth.     clindamycin (CLEOCIN) 300 MG capsule TAKE ONE CAPSULE BY MOUTH THREE TIMES DAILY FOR 10 DAYS 30 capsule 0   clotrimazole-betamethasone (LOTRISONE) cream Apply twice daily as needed to area of concern 45 g 2   Continuous Blood Gluc Receiver (FREESTYLE LIBRE 2 READER) DEVI E11.69 Check blood sugar 4 times daily as directed 1 each 0   Continuous Blood Gluc Sensor (FREESTYLE LIBRE 2 SENSOR) MISC E11.69 Change sensor every 14 days as directed 6 each 3   cyclobenzaprine (FLEXERIL) 10 MG tablet Take one tablet by mouth every 8 hours as needed for muscle spasms 270 tablet 1   dicyclomine (BENTYL) 20 MG tablet TAKE ONE TABLET BY MOUTH BEFORE MEALS AND AT BEDTIME AS NEEDED FOR STOMACH CRAMPING 180 tablet 1   famotidine (PEPCID) 20 MG tablet Take 1 tablet (20 mg total) by mouth 2 (two) times daily. 180 tablet 1   ferrous sulfate 325 (65 FE) MG tablet Take 325 mg by mouth every evening.     FETZIMA 80 MG CP24 TAKE ONE CAPSULE BY MOUTH EVERY EVENING 90 capsule 1   gabapentin (NEURONTIN) 300 MG capsule Take 2 capsules (600 mg total) by mouth 3 (three) times daily. 180 capsule 3   glucose blood (ONETOUCH VERIO) test strip Use as instructed 100 each 12   insulin degludec (TRESIBA FLEXTOUCH) 200 UNIT/ML FlexTouch Pen Inject 52 Units into the skin daily. 9 mL 3   Lancets (ONETOUCH DELICA PLUS [id number]) MISC USE TO check  blood glucose 2-3 times daily AS DIRECTED 100 each 3   levothyroxine (SYNTHROID) 75 MCG tablet Take 1 tablet (75 mcg total) by mouth daily. 90 tablet 1   lisdexamfetamine (VYVANSE) 70 MG capsule Take 1 capsule (70 mg total) by mouth every morning. 30 capsule 0  losartan (COZAAR) 100 MG tablet Take 1 tablet (100 mg total) by mouth daily. 90 tablet 0   Magnesium 500 MG CAPS 500 mg.     morphine (MS CONTIN) 30 MG 12 hr tablet Take 1 tablet (30 mg total) by mouth every 12 (twelve) hours. 60 tablet 0   Multiple Vitamin (MULTIVITAMIN WITH MINERALS) TABS tablet Take 1 tablet by mouth daily.      omega-3 acid ethyl esters (LOVAZA) 1 g capsule Take 2 capsules (2 g total) by mouth 2 (two) times daily. 360 capsule 1   ondansetron (ZOFRAN) 4 MG tablet Take 1 tablet (4 mg total) by mouth every 4 (four) hours as needed for nausea. 90 tablet 3   pantoprazole (PROTONIX) 40 MG tablet Take 1 tablet (40 mg total) by mouth 2 (two) times daily. 180 tablet 1   potassium chloride (MICRO-K) 10 MEQ CR capsule Take 2 capsules (20 mEq total) by mouth 2 (two) times daily. 360 capsule 3   prochlorperazine (COMPAZINE) 5 MG tablet Take 1 tablet (5 mg total) by mouth every 6 (six) hours as needed. 30 tablet 2   rOPINIRole (REQUIP) 0.25 MG tablet Take 1 tablet (0.25 mg total) by mouth at bedtime. 90 tablet 1   SYNJARDY 12.05-998 MG TABS TAKE ONE TABLET BY MOUTH TWICE DAILY 180 tablet 0   tirzepatide (MOUNJARO) 5 MG/0.5ML Pen Inject 5 mg into the skin once a week. 2 mL 0   Vitamin D, Ergocalciferol, (DRISDOL) 1.25 MG (50000 UNIT) CAPS capsule Take one capsule by mouth weekly 12 capsule 1   zolpidem (AMBIEN) 10 MG tablet One before bed 30 tablet 5   Current Facility-Administered Medications on File Prior to Visit  Medication Dose Route Frequency Provider Last Rate Last Admin   sodium chloride flush (NS) 0.9 % injection 10 mL  10 mL Intracatheter PRN Mosher, Kelli A, PA-C   10 mL at 02/19/21 1004   Past Medical History:   Diagnosis Date   Acute postoperative respiratory insufficiency 04/17/2020   AKI (acute kidney injury) ([place]) 04/17/2020   Anemia    Anemia, unspecified    Anemia, unspecified    Anemia, unspecified    Anxiety    Arthritis    BRCA gene mutation positive    Chronic pain syndrome    Depression    Diabetes mellitus without complication ([place])    type 2   Diabetic ulcer of toe of left foot associated with type 2 diabetes mellitus, with fat layer exposed ([place]) 03/27/2020   Gastritis    Genetic susceptibility to malignant neoplasm of breast    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 No longer has   Past Surgical History:  Procedure Laterality Date   APPENDECTOMY     BACK SURGERY     x 3 lower disc   BILATERAL TOTAL MASTECTOMY WITH AXILLARY LYMPH NODE DISSECTION Bilateral 09/2019   CHOLECYSTECTOMY     nephrolithiasis     ROBOTIC ASSISTED TOTAL HYSTERECTOMY WITH BILATERAL SALPINGO OOPHERECTOMY Bilateral 06/14/2016  Procedure: ROBOTIC ASSISTED TOTAL HYSTERECTOMY WITH BILATERAL SALPINGO OOPHORECTOMY;  Surgeon: Nancy Marus, MD;  Location: [facility];  Service: Gynecology;  Laterality: Bilateral;    Family History  Problem Relation Age of Onset   Cancer Mother        breast   CAD Father    Diabetes Father    Heart failure Father    Cancer Father        renal carcinoma   Kidney failure Father    Cancer Brother 49       renal carcinoma.   Stroke Paternal Grandmother    Social History   Socioeconomic History   Marital status: Divorced    Spouse name: Not on  file   Number of children: Not on file   Years of education: Not on file   Highest education level: Not on file  Occupational History   Occupation: disabled  Tobacco Use   Smoking status: Never   Smokeless tobacco: Never  Substance and Sexual Activity   Alcohol use: No   Drug use: No   Sexual activity: Yes  Other Topics Concern   Not on file  Social History Narrative   wears sunscreen, brushes and flosses daily, see's dentist bi-annually, has smoke/carbon monoxide detectors, wears a seatbelt and practices gun safety   Social Determinants of Health   Financial Resource Strain: Medium Risk (02/02/2022)   Overall Financial Resource Strain (CARDIA)    Difficulty of Paying Living Expenses: Somewhat hard  Food Insecurity: Food Insecurity Present (02/07/2022)   Hunger Vital Sign    Worried About [place] in the Last Year: Sometimes true    Ran Out of Food in the Last Year: Sometimes true  Transportation Needs: No Transportation Needs (02/07/2022)   PRAPARE - Hydrologist (Medical): No    Lack of Transportation (Non-Medical): No  Recent Concern: Transportation Needs - Unmet Transportation Needs (01/19/2022)   PRAPARE - Transportation    Lack of Transportation (Medical): Yes    Lack of Transportation (Non-Medical): Yes  Physical Activity: Inactive (02/21/2022)   Exercise Vital Sign    Days of Exercise per Week: 0 days    Minutes of Exercise per Session: 0 min  Stress: Stress Concern Present (02/21/2022)   [place]    Feeling of Stress : Rather much  Social Connections: Moderately Isolated (02/02/2022)   Social Connection and Isolation Panel [NHANES]    Frequency of Communication with Friends and Family: More than three times a week    Frequency of Social Gatherings with Friends and Family: Once a week    Attends Religious Services: 1 to 4 times per year    Active Member of [employer]  or Organizations: No    Attends Music therapist: Never    Marital Status: Divorced    Objective:  Pulse 95   Temp (!) 97.3 F (36.3 C)   Ht '5\' 2"'$  (1.575 m)   Wt 207 lb (93.9 kg)   LMP 06/13/2009   SpO2 95%   BMI 37.86 kg/m      03/01/2022    9:52 AM 02/10/2022   11:12 AM 02/04/2022    1:11 PM  BP/Weight  Systolic BP  270 623  Diastolic BP  65 72  Wt. (Lbs) 207    BMI 37.86 kg/m2      Physical Exam  Diabetic Foot Exam - Simple   No data filed  Lab Results  Component Value Date   WBC 4.7 02/04/2022   HGB 11.2 02/04/2022   HCT 36.8 02/04/2022   PLT 81 (LL) 02/04/2022   GLUCOSE 195 (H) 01/06/2022   CHOL 111 01/06/2022   TRIG 120 01/06/2022   HDL 35 (L) 01/06/2022   LDLCALC 54 01/06/2022   ALT 25 01/06/2022   AST 29 01/06/2022   NA 142 01/06/2022   K 4.2 01/06/2022   CL 101 01/06/2022   CREATININE 1.05 (H) 01/06/2022   BUN 10 01/06/2022   CO2 27 01/06/2022   TSH 4.080 01/06/2022   HGBA1C 7.4 (H) 01/06/2022   MICROALBUR 30 10/09/2019      Assessment & Plan:    There are no diagnoses linked to this encounter.   No orders of the defined types were placed in this encounter.   No orders of the defined types were placed in this encounter.    Follow-up: No follow-ups on file.  An After Visit Summary was printed and given to the patient.  [name], MD [name] [phone]

## 2022-03-02 ENCOUNTER — Other Ambulatory Visit: Payer: Self-pay

## 2022-03-02 ENCOUNTER — Encounter (HOSPITAL_COMMUNITY): Payer: Self-pay | Admitting: Podiatry

## 2022-03-02 ENCOUNTER — Telehealth: Payer: Self-pay | Admitting: Urology

## 2022-03-02 LAB — COMPREHENSIVE METABOLIC PANEL
ALT: 21 IU/L (ref 0–32)
AST: 24 IU/L (ref 0–40)
Albumin/Globulin Ratio: 2 (ref 1.2–2.2)
Albumin: 4.2 g/dL (ref 3.8–4.9)
Alkaline Phosphatase: 98 IU/L (ref 44–121)
BUN/Creatinine Ratio: 15 (ref 9–23)
BUN: 14 mg/dL (ref 6–24)
Bilirubin Total: 0.4 mg/dL (ref 0.0–1.2)
CO2: 23 mmol/L (ref 20–29)
Calcium: 9.5 mg/dL (ref 8.7–10.2)
Chloride: 103 mmol/L (ref 96–106)
Creatinine, Ser: 0.92 mg/dL (ref 0.57–1.00)
Globulin, Total: 2.1 g/dL (ref 1.5–4.5)
Glucose: 178 mg/dL — ABNORMAL HIGH (ref 70–99)
Potassium: 4.3 mmol/L (ref 3.5–5.2)
Sodium: 141 mmol/L (ref 134–144)
Total Protein: 6.3 g/dL (ref 6.0–8.5)
eGFR: 74 mL/min/{1.73_m2} (ref >59)

## 2022-03-02 LAB — PROTIME-INR
INR: 1 (ref 0.9–1.2)
Prothrombin Time: 11.1 s (ref 9.1–12.0)

## 2022-03-02 LAB — CBC WITH DIFFERENTIAL/PLATELET
Basophils Absolute: 0 10*3/uL (ref 0.0–0.2)
Basos: 1 %
EOS (ABSOLUTE): 0.1 10*3/uL (ref 0.0–0.4)
Eos: 4 %
Hematocrit: 35.7 % (ref 34.0–46.6)
Hemoglobin: 11.2 g/dL (ref 11.1–15.9)
Immature Grans (Abs): 0 10*3/uL (ref 0.0–0.1)
Immature Granulocytes: 0 %
Lymphocytes Absolute: 1.1 10*3/uL (ref 0.7–3.1)
Lymphs: 30 %
MCH: 26.7 pg (ref 26.6–33.0)
MCHC: 31.4 g/dL — ABNORMAL LOW (ref 31.5–35.7)
MCV: 85 fL (ref 79–97)
Monocytes Absolute: 0.3 10*3/uL (ref 0.1–0.9)
Monocytes: 8 %
Neutrophils Absolute: 2.1 10*3/uL (ref 1.4–7.0)
Neutrophils: 57 %
Platelets: 79 10*3/uL — CL (ref 150–450)
RBC: 4.2 x10E6/uL (ref 3.77–5.28)
RDW: 14.8 % (ref 11.7–15.4)
WBC: 3.6 10*3/uL (ref 3.4–10.8)

## 2022-03-02 LAB — APTT: aPTT: 28 s (ref 24–33)

## 2022-03-02 NOTE — Progress Notes (Signed)
Pre-op call completed at 1730  Chart sent to anesthesia for review.   Patient states she has had a cough for the last 2 months. She states that it is improving, but she still has "coughing fits sometimes." States that she has not needed the albuterol inhaler in over a month. States that she had 2 MRIs done and the doctors saw "a spot at the bottom of her lung on the first MRI and on the 2nd MRI the spot had moved to the top of her lung."

## 2022-03-02 NOTE — Telephone Encounter (Signed)
DOS - 03/04/22  AMPUTATION MPJ 1-2 RIGHT --- 48403 AMPUTATION IPJ 1-3 LEFT --- 97953   HTA EFFECTIVE DATE - 01/24/22  RECEIVED FAX FROM [facility] STATING THAT CPT CODES 69223 AND  00979 HAVE BEEN APPROVED, AUTH # [id number], GOOD FROM 03/04/22 - 06/02/22.

## 2022-03-03 ENCOUNTER — Other Ambulatory Visit: Payer: Self-pay

## 2022-03-03 DIAGNOSIS — Z01818 Encounter for other preprocedural examination: Secondary | ICD-10-CM | POA: Insufficient documentation

## 2022-03-03 NOTE — Assessment & Plan Note (Signed)
EKG and chest x-ray are normal.  Repeat labs from today were normal. Cleared for surgery.

## 2022-03-03 NOTE — Assessment & Plan Note (Signed)
This also contributes to thrombocytopenia.

## 2022-03-03 NOTE — Progress Notes (Signed)
Anesthesia Chart Review: Linda [name]  Case: [id number] Date/Time: 03/04/22 0715   Procedure: AMPUTATION TOE RIGHT FOOT PARTIAL OR TOTAL GREAT TOE AND SECOND TOE, LEFT FOOT PARTIAL OR TOTAL GREAT TOE, SECOND AND THIRD TOE (Bilateral: Toe)   Anesthesia type: Choice   Pre-op diagnosis: OSTEOMYELITS BILATERAL   Location: [facility] OR ROOM 30 / [facility]   Surgeons: Yevonne Pax, DPM       DISCUSSION: Patient is a 52 year old female scheduled for the above procedure.  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  Last visit with [facility]EM[facility] Dr. Hinton Rao was on 12/13/21. Chest CT on 12/08/21 showed a new 6 mm LUL pulmonary nodule, new since previous CTA chest 04/08/20. Features suggestive of atelectasis or infection/inflammatory etiology, but advised 3 month follow-up imaging given breast cancer history. She reported a cough for over a month following what sounded like treatment with antibiotics and steroids. She was still having some wheezing at that time, so she prescribed an inhaler and another rounds of antibiotics and advised PCP follow-up.    Patient has had multiple visits with PCP [name], MD since November, including preoperative evaluation on 03/01/22. Reportedly patient still experiencing some coughing since her URI prior to November, but has been improving. She says she has not required her inhaler for the past month. Exam showed normal breath sounds, and CXR showed no acute process. EKG was reportedly normal, although tracing  not yet viewable in Epic. Labs done on 03/01/22 included PT/PTT, CMP, CBC with diff. PT/PTT normal, Cr 0.92, glucose 178, AST 24, ALT 21, WBC 3.6, H/H 11.2/35.7, PLT 79 which is consistent with her known history and recent prior results. A1c 7.4% on 01/06/22. Based on test results and exam findings, Dr. Tobie Poet wrote, "Cleared for surgery."  Patient is a same-day workup. I attempted to call patient but only got voicemail. Based on documentation by pharmacy, her last Darcel Bayley was likely on 02/26/22 but would need to confirm prior to surgery. It sounds like she still has a intermittent cough from a URI in November, but improving and no longer wheezing or requiring an inhaler. She had PCP evaluation just a few days ago and was medically cleared. Surgery is for osteomyelitis. Discussed with anesthesiologist Hoy Morn, MD. Anesthesia team to evaluate on the day of surgery.   VS: Ht '5\' 2"'$  (1.575 m)   Wt 94.8 kg   LMP 06/13/2009   BMI 38.23 kg/m  BP Readings from Last 3 Encounters:  03/01/22 124/74  02/10/22 105/65  02/04/22 130/72   Pulse Readings from Last 3 Encounters:  03/01/22 95  02/10/22 91  02/04/22 100     PROVIDERS: [name], MD is PCP  [name], MD is HEM Previously hepatology evaluation with Roosevelt Locks, NP ([facility]). Notes indicated that she tested negative for hepatitis A, B, and C and received hepatitis vaccines in 2020.   LABS: Last lab results in [facility] include: Lab Results  Component Value Date   WBC 3.6 03/01/2022   HGB 11.2 03/01/2022   HCT 35.7 03/01/2022   PLT 79 (LL) 03/01/2022   GLUCOSE 178 (H) 03/01/2022   ALT 21 03/01/2022  AST 24 03/01/2022   NA 141 03/01/2022   K 4.3 03/01/2022   CL 103 03/01/2022   CREATININE 0.92 03/01/2022   BUN 14 03/01/2022   CO2 23 03/01/2022   TSH 4.080 01/06/2022   INR 1.0 03/01/2022   HGBA1C 7.4 (H) 01/06/2022    IMAGES: Imaging in [facility]/PACS: CXR 03/01/2022 FINDINGS: Left chest port in place. The heart is normal  in size with stable mediastinal contours. No focal airspace disease, pleural effusion or pneumothorax. Normal pulmonary vasculature. Stable osseous structures. IMPRESSION: No acute chest findings.  MRI Bilateral toes 02/18/2022 IMPRESSION: Right forefoot: 1. Acute osteomyelitis of the distal phalanx of the second toe. 2. Mild bone marrow edema within the middle phalanx of the second toe and distal phalanx of the great toe with preserved T1 bone marrow signal. Findings may represent reactive osteitis versus early acute osteomyelitis. 3. Soft tissue swelling and edema at the distal aspect of the second toe compatible with cellulitis. No fluid collections.   Left forefoot: 1. Acute osteomyelitis of the proximal and distal phalanx of the left great toe. 2. New erosion of the distal phalanx of the third toe, compatible with osteomyelitis. This is favored to be subacute. 3. Soft tissue swelling and edema of the great toe and at the distal aspects of the second and third toes. No organized fluid collections.   CT Chest 12/10/2021 Impression: Interval resolution of tree-in-bud nodularity seen in the posterior right lung base on abdomen CT 09/10/21. 57m left upper lobe pulmonary nodule, new since previous CTA chest 04/08/2020. Features suggest that this may be atelectasis or infection/inflammatory etiology. Given the history of breast cancer, follow-up CT chest wo contrast in 3 months recommended to re-evaluate. Cirrhosis with probable splenomegaly.   MRI L-spine 10/07/2021 IMPRESSION: 1. Progressive disc degeneration at L3-4 and L5-S1 since 2007. 2. Mild spinal stenosis, moderate left lateral recess stenosis, and mild bilateral foraminal stenosis at L3-4. 3. Mild-to-moderate bilateral neural foraminal stenosis at L5-S1.   CT Abd 09/20/2021 IMPRESSION: 1. Cirrhosis. No new or suspicious liver masses. 2. Mild-to-moderate splenomegaly, stable. Stable mild paraumbilical varices. No  ascites. 3. Patchy tree-in-bud nodularity and ground-glass opacity at the medial dependent right lung base with dominant indistinct 0.8 cm nodule, new from 09/27/2019 CT, favoring infectious or inflammatory bronchiolitis. Suggest attention on follow-up noncontrast chest CT in 3 months to document clearance. 4. Aortic Atherosclerosis (ICD10-I70.0).    EKG: EKG done on 03/01/22 at preoperative evaluation by Dr. CTobie Poet Tracing not yet viewable in [facility]. She documented that EKG was "normal".   Last EKG seen is from 06/08/18: Sinus tachycardia at 122 bpm Low voltage QRS Septal infarct , age undetermined Abnormal ECG No significant change was found Confirmed by CJola Schmidt([phone] on 06/08/2018 6:55:08 PM   CV: N/A  Past Medical History:  Diagnosis Date   Acute postoperative respiratory insufficiency 04/17/2020   [place] kidney injury) [place] 04/17/2020   Anemia    Anemia, unspecified    Anemia, unspecified    Anemia, unspecified    Anxiety    Arthritis    BRCA gene mutation positive    Chronic pain syndrome    Depression    [place]    type 2   Diabetic ulcer of toe of left foot associated with type 2 diabetes mellitus, with fat layer exposed ([facility] 03/27/2020   Gastritis    Genetic susceptibility to malignant neoplasm of breast    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 No longer has    Past Surgical History:  Procedure Laterality Date   APPENDECTOMY     BACK SURGERY     x 3 lower disc   BILATERAL TOTAL MASTECTOMY  WITH AXILLARY LYMPH NODE DISSECTION Bilateral 09/2019   CHOLECYSTECTOMY     nephrolithiasis     ROBOTIC ASSISTED TOTAL HYSTERECTOMY WITH BILATERAL SALPINGO OOPHERECTOMY Bilateral 06/14/2016   Procedure: ROBOTIC ASSISTED TOTAL HYSTERECTOMY WITH BILATERAL SALPINGO OOPHORECTOMY;  Surgeon: Nancy Marus, MD;  Location: [facility];  Service: Gynecology;  Laterality: Bilateral;    MEDICATIONS: No current facility-administered medications for this encounter.    acetaminophen (TYLENOL) 500 MG tablet   albuterol (VENTOLIN HFA) 108 (90 Base) MCG/ACT inhaler   atorvastatin (LIPITOR) 10 MG tablet   Bilberry, Vaccinium myrtillus, (BILBERRY PO)   buPROPion (WELLBUTRIN XL) 300 MG 24 hr tablet   busPIRone (BUSPAR) 5 MG tablet   cariprazine (VRAYLAR) 3 MG capsule   clindamycin (CLEOCIN) 300 MG capsule   clotrimazole-betamethasone (LOTRISONE) cream   cyclobenzaprine (FLEXERIL) 10 MG tablet   dicyclomine (BENTYL) 20 MG tablet   famotidine (PEPCID) 20 MG tablet   ferrous sulfate 325 (65 FE) MG tablet   FETZIMA 80 MG CP24   gabapentin (NEURONTIN) 300 MG capsule   insulin degludec (TRESIBA FLEXTOUCH) 200 UNIT/ML FlexTouch Pen   levothyroxine (SYNTHROID) 75 MCG tablet   lisdexamfetamine (VYVANSE) 70 MG capsule   losartan (COZAAR) 100 MG tablet   Magnesium 500 MG CAPS   morphine (MS CONTIN) 30 MG 12 hr tablet   Multiple Vitamin (MULTIVITAMIN WITH MINERALS) TABS tablet   omega-3 acid ethyl esters (LOVAZA) 1 g capsule   ondansetron (ZOFRAN) 4 MG tablet   OVER THE COUNTER MEDICATION   pantoprazole (PROTONIX) 40 MG tablet   potassium chloride (MICRO-K) 10 MEQ CR capsule   Probiotic Product (PROBIOTIC PO)   prochlorperazine (COMPAZINE) 5 MG tablet   rOPINIRole (REQUIP) 0.25 MG tablet   SYNJARDY 12.05-998 MG TABS   tirzepatide (MOUNJARO) 5 MG/0.5ML Pen   Vitamin D, Ergocalciferol, (DRISDOL) 1.25 MG (50000 UNIT) CAPS capsule   zolpidem (AMBIEN) 10 MG tablet   Blood Glucose Monitoring Suppl (ONETOUCH VERIO  REFLECT) w/Device KIT   Blood Pressure Monitoring (SPHYGMOMANOMETER) MISC   Continuous Blood Gluc Receiver (FREESTYLE LIBRE 2 READER) DEVI   Continuous Blood Gluc Sensor (FREESTYLE LIBRE 2 SENSOR) MISC   glucose blood (ONETOUCH VERIO) test strip   Lancets (ONETOUCH DELICA PLUS ITGPQD82M) MISC    sodium chloride flush (NS) 0.9 % injection 10 mL    Myra Gianotti, PA-C Surgical Short Stay/Anesthesiology [facility] Phone [phone] [facility] Phone [phone] 03/03/2022 1:09 PM

## 2022-03-03 NOTE — Anesthesia Preprocedure Evaluation (Addendum)
Anesthesia Evaluation  Patient identified by MRN, date of birth, ID band Patient awake    Reviewed: Allergy & Precautions, NPO status , Patient's Chart, lab work & pertinent test results  Airway Mallampati: II  TM Distance: >3 FB Neck ROM: Full    Dental no notable dental hx. (+) Edentulous Upper, Dental Advisory Given   Pulmonary sleep apnea and Continuous Positive Airway Pressure Ventilation    Pulmonary exam normal breath sounds clear to auscultation- rhonchi       Cardiovascular hypertension, Pt. on medications Normal cardiovascular exam Rhythm:Regular Rate:Normal     Neuro/Psych  PSYCHIATRIC DISORDERS Anxiety Depression    negative neurological ROS     GI/Hepatic ,GERD  Medicated,,(+) Hepatitis -  Endo/Other  diabetes, Type 2Hypothyroidism    Renal/GU Renal disease     Musculoskeletal  (+) Arthritis , Osteoarthritis,    Abdominal  (+) + obese  Peds  Hematology  (+) Blood dyscrasia, anemia   Anesthesia Other Findings   Reproductive/Obstetrics negative OB ROS                             Lab Results  Component Value Date   WBC 3.6 03/01/2022   HGB 11.2 03/01/2022   HCT 35.7 03/01/2022   MCV 85 03/01/2022   PLT 79 (LL) 03/01/2022   Lab Results  Component Value Date   CREATININE 0.92 03/01/2022   BUN 14 03/01/2022   NA 141 03/01/2022   K 4.3 03/01/2022   CL 103 03/01/2022   CO2 23 03/01/2022   Lab Results  Component Value Date   INR 1.0 03/01/2022    EKG: normal sinus rhythm.   Anesthesia Physical Anesthesia Plan  ASA: 3  Anesthesia Plan: General   Post-op Pain Management: Tylenol PO (pre-op)*   Induction: Intravenous, Rapid sequence and Cricoid pressure planned  PONV Risk Score and Plan: 4 or greater and Ondansetron, Dexamethasone, Treatment may vary due to age or medical condition and Midazolam  Airway Management Planned: Oral ETT  Additional Equipment:    Intra-op Plan:   Post-operative Plan: Extubation in OR  Informed Consent: I have reviewed the patients History and Physical, chart, labs and discussed the procedure including the risks, benefits and alternatives for the proposed anesthesia with the patient or authorized representative who has indicated his/her understanding and acceptance.     Dental advisory given  Plan Discussed with: CRNA  Anesthesia Plan Comments: (PAT note written 03/03/2022 by Myra Gianotti, PA-C. She has medical clearance. Known chronic thrombocytopenia.   )        Anesthesia Quick Evaluation

## 2022-03-03 NOTE — Assessment & Plan Note (Signed)
Platelets have been stable at 80.  Mild increased risk of bleeding problems but overall should be fine.

## 2022-03-04 ENCOUNTER — Encounter: Payer: Self-pay | Admitting: Podiatry

## 2022-03-04 ENCOUNTER — Other Ambulatory Visit: Payer: Self-pay

## 2022-03-04 ENCOUNTER — Ambulatory Visit (HOSPITAL_COMMUNITY): Payer: PPO

## 2022-03-04 ENCOUNTER — Encounter (HOSPITAL_COMMUNITY)
Admission: RE | Disposition: A | Discharge status: Home / Self Care | Payer: Self-pay | Source: Home / Self Care | Attending: Podiatry

## 2022-03-04 ENCOUNTER — Ambulatory Visit (HOSPITAL_COMMUNITY)
Admission: RE | Admit: 2022-03-04 | Discharge: 2022-03-04 | Disposition: A | Discharge status: Home / Self Care | Payer: PPO | Attending: Podiatry | Admitting: Podiatry

## 2022-03-04 ENCOUNTER — Ambulatory Visit (HOSPITAL_COMMUNITY): Payer: PPO | Admitting: Vascular Surgery

## 2022-03-04 ENCOUNTER — Encounter (HOSPITAL_COMMUNITY): Payer: Self-pay | Admitting: Podiatry

## 2022-03-04 ENCOUNTER — Ambulatory Visit (HOSPITAL_BASED_OUTPATIENT_CLINIC_OR_DEPARTMENT_OTHER): Payer: PPO | Admitting: Vascular Surgery

## 2022-03-04 DIAGNOSIS — M86672 Other chronic osteomyelitis, left ankle and foot: Secondary | ICD-10-CM | POA: Insufficient documentation

## 2022-03-04 DIAGNOSIS — F419 Anxiety disorder, unspecified: Secondary | ICD-10-CM | POA: Insufficient documentation

## 2022-03-04 DIAGNOSIS — I10 Essential (primary) hypertension: Secondary | ICD-10-CM | POA: Diagnosis not present

## 2022-03-04 DIAGNOSIS — M7989 Other specified soft tissue disorders: Secondary | ICD-10-CM | POA: Diagnosis not present

## 2022-03-04 DIAGNOSIS — E782 Mixed hyperlipidemia: Secondary | ICD-10-CM | POA: Insufficient documentation

## 2022-03-04 DIAGNOSIS — E669 Obesity, unspecified: Secondary | ICD-10-CM | POA: Insufficient documentation

## 2022-03-04 DIAGNOSIS — G4730 Sleep apnea, unspecified: Secondary | ICD-10-CM | POA: Diagnosis not present

## 2022-03-04 DIAGNOSIS — M869 Osteomyelitis, unspecified: Secondary | ICD-10-CM | POA: Diagnosis not present

## 2022-03-04 DIAGNOSIS — Z89411 Acquired absence of right great toe: Secondary | ICD-10-CM | POA: Diagnosis not present

## 2022-03-04 DIAGNOSIS — Z79899 Other long term (current) drug therapy: Secondary | ICD-10-CM | POA: Insufficient documentation

## 2022-03-04 DIAGNOSIS — E039 Hypothyroidism, unspecified: Secondary | ICD-10-CM | POA: Diagnosis not present

## 2022-03-04 DIAGNOSIS — G894 Chronic pain syndrome: Secondary | ICD-10-CM | POA: Insufficient documentation

## 2022-03-04 DIAGNOSIS — M86671 Other chronic osteomyelitis, right ankle and foot: Secondary | ICD-10-CM | POA: Insufficient documentation

## 2022-03-04 DIAGNOSIS — F32A Depression, unspecified: Secondary | ICD-10-CM | POA: Insufficient documentation

## 2022-03-04 DIAGNOSIS — Z89422 Acquired absence of other left toe(s): Secondary | ICD-10-CM | POA: Diagnosis not present

## 2022-03-04 DIAGNOSIS — E1169 Type 2 diabetes mellitus with other specified complication: Secondary | ICD-10-CM

## 2022-03-04 DIAGNOSIS — M19071 Primary osteoarthritis, right ankle and foot: Secondary | ICD-10-CM | POA: Diagnosis not present

## 2022-03-04 DIAGNOSIS — E119 Type 2 diabetes mellitus without complications: Secondary | ICD-10-CM | POA: Insufficient documentation

## 2022-03-04 DIAGNOSIS — M7731 Calcaneal spur, right foot: Secondary | ICD-10-CM | POA: Diagnosis not present

## 2022-03-04 DIAGNOSIS — Z89412 Acquired absence of left great toe: Secondary | ICD-10-CM | POA: Diagnosis not present

## 2022-03-04 DIAGNOSIS — Z89421 Acquired absence of other right toe(s): Secondary | ICD-10-CM | POA: Diagnosis not present

## 2022-03-04 DIAGNOSIS — Z6837 Body mass index (BMI) 37.0-37.9, adult: Secondary | ICD-10-CM | POA: Diagnosis not present

## 2022-03-04 DIAGNOSIS — M1990 Unspecified osteoarthritis, unspecified site: Secondary | ICD-10-CM | POA: Diagnosis not present

## 2022-03-04 DIAGNOSIS — Z853 Personal history of malignant neoplasm of breast: Secondary | ICD-10-CM | POA: Insufficient documentation

## 2022-03-04 DIAGNOSIS — M7732 Calcaneal spur, left foot: Secondary | ICD-10-CM | POA: Diagnosis not present

## 2022-03-04 DIAGNOSIS — K219 Gastro-esophageal reflux disease without esophagitis: Secondary | ICD-10-CM | POA: Insufficient documentation

## 2022-03-04 DIAGNOSIS — I96 Gangrene, not elsewhere classified: Secondary | ICD-10-CM | POA: Diagnosis not present

## 2022-03-04 DIAGNOSIS — D638 Anemia in other chronic diseases classified elsewhere: Secondary | ICD-10-CM | POA: Diagnosis not present

## 2022-03-04 HISTORY — PX: AMPUTATION TOE: SHX6595

## 2022-03-04 HISTORY — DX: Other complications of anesthesia, initial encounter: T88.59XA

## 2022-03-04 LAB — GLUCOSE, CAPILLARY
Glucose-Capillary: 111 mg/dL — ABNORMAL HIGH (ref 70–99)
Glucose-Capillary: 78 mg/dL (ref 70–99)

## 2022-03-04 LAB — TYPE AND SCREEN
ABO/RH(D): A NEG
Antibody Screen: NEGATIVE

## 2022-03-04 SURGERY — AMPUTATION, TOE
Anesthesia: General | Site: Toe | Laterality: Bilateral

## 2022-03-04 MED ORDER — 0.9 % SODIUM CHLORIDE (POUR BTL) OPTIME
TOPICAL | Status: DC | PRN
  Administered 2022-03-04: 1000 mL

## 2022-03-04 MED ORDER — EPHEDRINE SULFATE-NACL 50-0.9 MG/10ML-% IV SOSY
PREFILLED_SYRINGE | INTRAVENOUS | Status: DC | PRN
  Administered 2022-03-04 (×3): 10 mg via INTRAVENOUS

## 2022-03-04 MED ORDER — PROPOFOL 10 MG/ML IV BOLUS
INTRAVENOUS | Status: AC
  Filled 2022-03-04: qty 20

## 2022-03-04 MED ORDER — OXYCODONE HCL 5 MG/5ML PO SOLN: 5.0000 mg | Freq: Once | ORAL | Status: DC | PRN

## 2022-03-04 MED ORDER — ONDANSETRON HCL 4 MG/2ML IJ SOLN
INTRAMUSCULAR | Status: DC | PRN
  Administered 2022-03-04: 4 mg via INTRAVENOUS

## 2022-03-04 MED ORDER — CEFAZOLIN SODIUM-DEXTROSE 2-4 GM/100ML-% IV SOLN
2.0000 g | Freq: Once | INTRAVENOUS | Status: AC
  Administered 2022-03-04: 2 g via INTRAVENOUS
  Filled 2022-03-04 (×2): qty 100

## 2022-03-04 MED ORDER — LIDOCAINE 2% (20 MG/ML) 5 ML SYRINGE
INTRAMUSCULAR | Status: DC | PRN
  Administered 2022-03-04: 80 mg via INTRAVENOUS

## 2022-03-04 MED ORDER — BUPIVACAINE HCL (PF) 0.5 % IJ SOLN
INTRAMUSCULAR | Status: AC
  Filled 2022-03-04: qty 30

## 2022-03-04 MED ORDER — FENTANYL CITRATE (PF) 100 MCG/2ML IJ SOLN: 25.0000 ug | INTRAMUSCULAR | Status: DC | PRN

## 2022-03-04 MED ORDER — MIDAZOLAM HCL 2 MG/2ML IJ SOLN
INTRAMUSCULAR | Status: DC | PRN
  Administered 2022-03-04: .5 mg via INTRAVENOUS

## 2022-03-04 MED ORDER — FENTANYL CITRATE (PF) 250 MCG/5ML IJ SOLN
INTRAMUSCULAR | Status: DC | PRN
  Administered 2022-03-04: 100 ug via INTRAVENOUS
  Administered 2022-03-04: 50 ug via INTRAVENOUS

## 2022-03-04 MED ORDER — ACETAMINOPHEN 500 MG PO TABS
1000.0000 mg | ORAL_TABLET | Freq: Once | ORAL | Status: AC
  Administered 2022-03-04: 1000 mg via ORAL
  Filled 2022-03-04: qty 2

## 2022-03-04 MED ORDER — ORAL CARE MOUTH RINSE: 15.0000 mL | Freq: Once | OROMUCOSAL | Status: AC

## 2022-03-04 MED ORDER — LACTATED RINGERS IV SOLN: INTRAVENOUS | Status: DC

## 2022-03-04 MED ORDER — MEPERIDINE HCL 25 MG/ML IJ SOLN: 6.2500 mg | INTRAMUSCULAR | Status: DC | PRN

## 2022-03-04 MED ORDER — PHENYLEPHRINE 80 MCG/ML (10ML) SYRINGE FOR IV PUSH (FOR BLOOD PRESSURE SUPPORT)
PREFILLED_SYRINGE | INTRAVENOUS | Status: DC | PRN
  Administered 2022-03-04: 80 ug via INTRAVENOUS
  Administered 2022-03-04: 160 ug via INTRAVENOUS
  Administered 2022-03-04 (×2): 80 ug via INTRAVENOUS
  Administered 2022-03-04: 160 ug via INTRAVENOUS
  Administered 2022-03-04: 80 ug via INTRAVENOUS
  Administered 2022-03-04: 160 ug via INTRAVENOUS
  Administered 2022-03-04: 80 ug via INTRAVENOUS

## 2022-03-04 MED ORDER — INSULIN ASPART 100 UNIT/ML IJ SOLN: 0.0000 [IU] | INTRAMUSCULAR | Status: DC | PRN

## 2022-03-04 MED ORDER — AMISULPRIDE (ANTIEMETIC) 5 MG/2ML IV SOLN: 10.0000 mg | Freq: Once | INTRAVENOUS | Status: DC | PRN

## 2022-03-04 MED ORDER — OXYCODONE HCL 5 MG PO TABS: 5.0000 mg | ORAL_TABLET | Freq: Once | ORAL | Status: DC | PRN

## 2022-03-04 MED ORDER — FENTANYL CITRATE (PF) 250 MCG/5ML IJ SOLN
INTRAMUSCULAR | Status: AC
  Filled 2022-03-04: qty 5

## 2022-03-04 MED ORDER — PROPOFOL 10 MG/ML IV BOLUS
INTRAVENOUS | Status: DC | PRN
  Administered 2022-03-04: 150 mg via INTRAVENOUS

## 2022-03-04 MED ORDER — SUCCINYLCHOLINE CHLORIDE 200 MG/10ML IV SOSY
PREFILLED_SYRINGE | INTRAVENOUS | Status: DC | PRN
  Administered 2022-03-04: 100 mg via INTRAVENOUS

## 2022-03-04 MED ORDER — LIDOCAINE HCL (PF) 1 % IJ SOLN
INTRAMUSCULAR | Status: DC | PRN
  Administered 2022-03-04: 10 mL

## 2022-03-04 MED ORDER — CHLORHEXIDINE GLUCONATE 0.12 % MT SOLN
15.0000 mL | Freq: Once | OROMUCOSAL | Status: AC
  Administered 2022-03-04: 15 mL via OROMUCOSAL
  Filled 2022-03-04: qty 15

## 2022-03-04 MED ORDER — DEXAMETHASONE SODIUM PHOSPHATE 10 MG/ML IJ SOLN
INTRAMUSCULAR | Status: DC | PRN
  Administered 2022-03-04: 4 mg via INTRAVENOUS

## 2022-03-04 MED ORDER — LIDOCAINE HCL (PF) 1 % IJ SOLN
INTRAMUSCULAR | Status: AC
  Filled 2022-03-04: qty 30

## 2022-03-04 MED ORDER — ROCURONIUM BROMIDE 10 MG/ML (PF) SYRINGE
PREFILLED_SYRINGE | INTRAVENOUS | Status: AC
  Filled 2022-03-04: qty 10

## 2022-03-04 MED ORDER — MIDAZOLAM HCL 2 MG/2ML IJ SOLN
INTRAMUSCULAR | Status: AC
  Filled 2022-03-04: qty 2

## 2022-03-04 MED ORDER — PROMETHAZINE HCL 25 MG/ML IJ SOLN: 6.2500 mg | INTRAMUSCULAR | Status: DC | PRN

## 2022-03-04 MED ORDER — PHENYLEPHRINE HCL-NACL 20-0.9 MG/250ML-% IV SOLN
INTRAVENOUS | Status: DC | PRN
  Administered 2022-03-04: 75 ug/min via INTRAVENOUS

## 2022-03-04 MED ORDER — ONDANSETRON HCL 4 MG/2ML IJ SOLN
INTRAMUSCULAR | Status: AC
  Filled 2022-03-04: qty 2

## 2022-03-04 SURGICAL SUPPLY — 38 items
BAG COUNTER SPONGE SURGICOUNT (BAG) ×1 IMPLANT
BLADE SURG 15 STRL LF DISP TIS (BLADE) IMPLANT
BLADE SURG 15 STRL SS (BLADE)
BNDG ELASTIC 4X5.8 VLCR STR LF (GAUZE/BANDAGES/DRESSINGS) IMPLANT
BNDG GAUZE DERMACEA FLUFF 4 (GAUZE/BANDAGES/DRESSINGS) ×1 IMPLANT
CHLORAPREP W/TINT 26 (MISCELLANEOUS) IMPLANT
COVER SURGICAL LIGHT HANDLE (MISCELLANEOUS) ×1 IMPLANT
CUFF TOURN SGL QUICK 18X4 (TOURNIQUET CUFF) ×1 IMPLANT
CUFF TOURN SGL QUICK 24 (TOURNIQUET CUFF)
CUFF TRNQT CYL 24X4X16.5-23 (TOURNIQUET CUFF) IMPLANT
ELECT REM PT RETURN 9FT ADLT (ELECTROSURGICAL)
ELECTRODE REM PT RTRN 9FT ADLT (ELECTROSURGICAL) IMPLANT
GAUZE PAD ABD 8X10 STRL (GAUZE/BANDAGES/DRESSINGS) ×1 IMPLANT
GAUZE SPONGE 4X4 12PLY STRL (GAUZE/BANDAGES/DRESSINGS) ×1 IMPLANT
GAUZE XEROFORM 1X8 LF (GAUZE/BANDAGES/DRESSINGS) ×1 IMPLANT
GLOVE BIO SURGEON STRL SZ8 (GLOVE) ×1 IMPLANT
GLOVE BIOGEL PI IND STRL 8 (GLOVE) ×1 IMPLANT
GOWN STRL REUS W/ TWL LRG LVL3 (GOWN DISPOSABLE) ×2 IMPLANT
GOWN STRL REUS W/TWL LRG LVL3 (GOWN DISPOSABLE) ×2
KIT BASIN OR (CUSTOM PROCEDURE TRAY) ×1 IMPLANT
KIT TURNOVER KIT B (KITS) ×1 IMPLANT
NDL PRECISIONGLIDE 27X1.5 (NEEDLE) ×1 IMPLANT
NEEDLE PRECISIONGLIDE 27X1.5 (NEEDLE) ×1 IMPLANT
NS IRRIG 1000ML POUR BTL (IV SOLUTION) ×1 IMPLANT
PACK ORTHO EXTREMITY (CUSTOM PROCEDURE TRAY) ×1 IMPLANT
PAD ARMBOARD 7.5X6 YLW CONV (MISCELLANEOUS) ×2 IMPLANT
PAD CAST 4YDX4 CTTN HI CHSV (CAST SUPPLIES) ×1 IMPLANT
PADDING CAST COTTON 4X4 STRL (CAST SUPPLIES) ×1
SOL PREP POV-IOD 4OZ 10% (MISCELLANEOUS) ×1 IMPLANT
STAPLER VISISTAT 35W (STAPLE) IMPLANT
SUT PROLENE 4 0 PS 2 18 (SUTURE) IMPLANT
SUT VIC AB 3-0 PS2 18 (SUTURE) IMPLANT
SUT VICRYL 4-0 PS2 18IN ABS (SUTURE) IMPLANT
SYR CONTROL 10ML LL (SYRINGE) ×1 IMPLANT
TOWEL GREEN STERILE (TOWEL DISPOSABLE) ×1 IMPLANT
TOWEL GREEN STERILE FF (TOWEL DISPOSABLE) ×1 IMPLANT
TUBE CONNECTING 12X1/4 (SUCTIONS) ×1 IMPLANT
YANKAUER SUCT BULB TIP NO VENT (SUCTIONS) IMPLANT

## 2022-03-04 NOTE — Discharge Instructions (Signed)
Foot & Ankle Surgery Patient Discharge Instructions:   Arrange to have an adult drive you home after surgery. If you had general anesthesia, it may take a day or more to fully recover. So, for at least the next 24 hours: Do not drive or use machinery or power tools; do not drink alcohol; and do not make any major decisions.   Bandages: Keep your dressings clean, dry, and intact. Do not remove or change. When bathing/showering, cover the bandage or cast with a plastic bag to keep it dry (still keep the area away from the water) and use a chair, do not stand. Don't remove your bandage until your doctor tells you to. If your bandage gets wet or dirty, check with your doctor. You can likely replace it with a clean, dry one.  Activity: Weightbearing to left and right foot in CAM boot Sit or lie down when possible. Put a pillow under your heel to raise your foot above the level of your heart. Place ice bag or pack behind knee on surgical side for 20 minutes every hour that you are awake for the first 24-48 hours. You can drive again when instructed by your doctor. Wear your surgical shoe at all times unless told otherwise by your health care provider. Use crutches or a cane as directed. Follow your doctor's instructions about putting weight on your foot.  Diet: Start with liquids and light foods (such as dry toast, bananas, and applesauce). As you feel up to it, slowly return to your normal diet. Drink at least six to eight glasses of water or other nonalcoholic fluids a day. To avoid nausea, eat before taking narcotic pain medications.  Medications: Take all medications as instructed. Take pain medications on time. Do not wait until the pain is bad before taking your medications. Avoid alcohol while on pain medications.  Call your doctor if: Continuous bleeding through dressings. If your dressings get wet. Fevers over 100.4 degrees Fahrenheit (38 degrees Celsius). Chest pains or difficulty  breathing.

## 2022-03-04 NOTE — Progress Notes (Signed)
Orthopedic Tech Progress Note Patient Details:  Jacquelyne Cun 07-21-1970 [id number] Dropped off 2 Med. CAM walkers at OR desk  Ortho Devices Type of Ortho Device: CAM walker Ortho Device/Splint Location: BLE Ortho Device/Splint Interventions: Ordered      Cowen Pesqueira A Zenya Hickam 03/04/2022, 9:01 AM

## 2022-03-04 NOTE — Progress Notes (Signed)
Orthopedic Tech Progress Note Patient Details:  Linda [name] [birth year] [id number] MD cancelled order for CAM boots. Delivered post op shoes to the patient in PACU Patient ID: [name], female   DOB: 1970/04/01, 51 y.o.   MRN: [id number]  Chip Boer 03/04/2022, 10:11 AM

## 2022-03-04 NOTE — H&P (Signed)
History and Physical Interval Note:  03/04/2022 7:12 AM  Linda [name]  has presented today for surgery, with the diagnosis of osteomyelitis of the 1st and 2nd toe on right foot, 1st - 3rd toe on left foot.  The various methods of treatment have been discussed with the patient and family. After consideration of risks, benefits and other options for treatment, the patient has consented to   Procedure(s): AMPUTATION TOE RIGHT FOOT PARTIAL OR TOTAL GREAT TOE AND SECOND TOE, LEFT FOOT PARTIAL OR TOTAL GREAT TOE, SECOND AND THIRD TOE (Bilateral) as a surgical intervention.  The patient's history has been reviewed, patient examined, no change in status, stable for surgery.  I have reviewed the patient's chart and labs.  Questions were answered to the patient's satisfaction.     [name]

## 2022-03-04 NOTE — Progress Notes (Signed)
Pt platelets 79 on last CBC on 03/01/22. Dr Lissa Hoard notified, new order for T/S. Dr. Rhea Belton notified, no new orders from MD. CRNA notified to draw T/S with IV start.

## 2022-03-04 NOTE — Op Note (Signed)
Full Operative Report  Date of Operation: 9:56 AM, 03/04/2022   Patient: Linda [name] - 52 y.o. female  Surgeon: Yevonne Pax, DPM   Assistant: None  Diagnosis: OSTEOMYELITIS 1st 2nd and 3rd toe Left foot; Osteomyelitis 1st and 2nd toe, Right foot  Procedure:  1. Amputation of Hallux at metatarsal phalangeal joint level, Left 2. Amputation of 2nd toe at mid proximal phalanx level, Left 3. Amputation of 3rd toe at mid proximal phalanx level, Left 4. Amputation of Hallux at interphalangeal joint level, Right 5. Amputation of 2nd toe at metatarsal phalangeal joint level, Right    Anesthesia: General  No responsible provider has been recorded for the case.  Anesthesiologist: Nolon Nations, MD CRNA: Reeves Dam, CRNA; Viann Fish B, CRNA   Estimated Blood Loss: Minimal, 5 mL  Hemostasis: 1) Anatomical dissection, mechanical compression, electrocautery 2) Ankle tourniquet bilateral, 250 mm HG, 15 min each side  Implants: * No implants in log *  Materials: Prolene suture  Injectables: 1) Pre-operatively: 10 cc of 1:1 mix 1% lidocaine and 0.5% marcaine plain divided between right and left foot 2) Post-operatively: None  Specimens: toes 1-3 left foot for pathology, toes 1-2 right foot for pathology, deep tissue culture right 2nd MPJ, distal phalanx 2nd toe right foot for culture   Antibiotics: 2 gm Ancef Pre op  Drains: None  Complications: Patient tolerated the procedure well without complication.   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 Also has mild chronic cellulitis of these toes. Right foot 1st distal phalanx and 2nd middle and didstal phalanx, Left foot 1st proximal and distal phalanx, 3rd toe distal phalanx with concern for OM on MRI. Also with dactylitis and erythema of the distal 2nd toe  left foot concerning for distal phalanx osteomyelitis.  The patient has failed conservative treatments of various modalities. At this time the patient has elected to proceed with surgical correction. All alternatives, risks, and complications of the procedures were thoroughly explained to the patient. Patient exhibits appropriate understanding of all discussion points and informed consent was signed and obtained in the chart with no guarantees to surgical outcome given or implied.  Description of Procedure: Patient was brought to the operating room and placed on the operative table in the supine position. Patient was secured to the table with safety belt, a contralateral SCD was placed, and all bony prominences were well padded. A surgical timeout was performed and all members of the operating room, the procedure, and the surgical site were identified. General anesthesia occurred. Local anesthetic as previously described was then injected about the operative field in a local infiltrative block.   A well padded pneumatic tourniquet was placed to the operative limb. The left and right lower extremity was then prepped and draped in the usual sterile manner. The pneumatic tourniquet was inflated first on the left side and subsequently on the right foot. The following procedure then began.  Attention was directed to the left foot  Attention was directed to the hallux on the foot. A full-thickness incision encompassing the entire digit was made using a #15 blade. Dissection was carried down to bone. The toe was secured with a towel clamp, further dissected in its entirety, and disarticulated at the Metatarsal phalangeal joint and passed to the back table as a gross specimen. This was then labled and sent to pathology.  All remaining  necrotic and devitalized soft tissue structures were visualized and dissected away using sharp and dull dissection. Care was taken to protect all neurovascular structures throughout  the dissection. All bleeders were cauterized as necessary. The area was then flushed with copious amounts of sterile saline. Then using the suture materials previously described, the site was closed in anatomic layers and the skin was well approximated under minimal tension.  Attention was directed to the 2nd digit on the left foot. A full-thickness incision encompassing the entire digit was made using a #15 blade. Dissection was carried down to bone. The toe was secured with a towel clamp, further dissected in its entirety, and disarticulated at the Proximal interphalangeal joint and passed to the back table as a gross specimen. This was then labled and sent to pathology.  All remaining necrotic and devitalized soft tissue structures were visualized and dissected away using sharp and dull dissection. Sagittal saw was then used to resect the head of the proximal phalanx to aid in amputation site closure. Care was taken to protect all neurovascular structures throughout the dissection. All bleeders were cauterized as necessary. The area was then flushed with copious amounts of sterile saline. Then using the suture materials previously described, the site was closed in anatomic layers and the skin was well approximated under minimal tension.  Attention was directed to the 3rd digit on the left foot. A full-thickness incision encompassing the entire digit was made using a #15 blade. Dissection was carried down to bone. The toe was secured with a towel clamp, further dissected in its entirety, and disarticulated at the Proximal interphalangeal joint and passed to the back table as a gross specimen. This was then labled and sent to pathology.  All remaining necrotic and devitalized soft tissue structures were visualized and dissected away using sharp and dull dissection. Sagittal saw was then used to resect the head of the proximal phalanx to aid in amputation site closure. Care was taken to protect all neurovascular  structures throughout the dissection. All bleeders were cauterized as necessary. The area was then flushed with copious amounts of sterile saline. Then using the suture materials previously described, the site was closed in anatomic layers and the skin was well approximated under minimal tension.  Tourniquet was released on the left side and inflated on the right. Attention was then directed to the right foot  Attention was directed to the distal hallux of the right foot. A full-thickness incision encompassing the entire distal digit was made using a #15 blade and was done so that a plantar flap was maintained. Dissection was carried down to bone. The distal phalanx was secured with a towel clamp, further dissected in its entirety, and disarticulated at the  interphalangeal joint (IPJ) and passed to the back table as a gross specimen. This was then labled and sent to pathology. The bone was noted to be soft and eroded, and consistent with osteomyelitis. All remaining necrotic and devitalized soft tissue structures were visualized and dissected away using sharp and dull dissection. No frank pus or purulent drainage was noted. Care was taken to protect all neurovascular structures throughout the dissection. All bleeders were cauterized as necessary.  The area was then flushed with copious amounts of sterile saline. Then using the suture materials previously described, the site was closed in anatomic layers and the skin was well approximated under minimal tension. The plantar digital flap was approximated using prolene.  Attention was directed to the 2nd digit on the right foot. A  full-thickness incision encompassing the entire digit was made using a #15 blade. Dissection was carried down to bone. The toe was secured with a towel clamp, further dissected in its entirety, and disarticulated at the Metatarsal phalangeal joint and passed to the back table as a gross specimen. This was then labled and sent to  pathology. The bone was noted to be soft and eroded, and consistent with osteomyelitis. All remaining necrotic and devitalized soft tissue structures were visualized and dissected away using sharp and dull dissection. Care was taken to protect all neurovascular structures throughout the dissection. All bleeders were cauterized as necessary. A deep tissue culture was obtained at this time. A piece of bone from the distal phalanx was separately removed with roungeur and sent for culture. The area was then flushed with copious amounts of sterile saline. Then using the suture materials previously described, the site was closed in anatomic layers and the skin was well approximated under minimal tension.  The surgical site was then dressed with xeroform 4x4 kerlix ace wrap. The tourniquet was deflated after 15 minutes total for each side with immediate vascular return noted to the operative foot. The patient tolerated both the procedure and anesthesia well with vital signs stable throughout. The patient was transferred from the OR to recovery under the discretion of anesthesia.  Condition: Patient was taken to PACU in good condition and all vital signs stable and neurovascular status intact to the operative limb.  Discharge: Patient will be discharged with all instructions when cleared by anesthesia and follow up in office.  Indiyah Paone, Nena Floyce Bujak, DPM will follow the patient throughout the entire post-operative course and the patient is aware of all post-operative protocols in place. The patient will follow the protocol of rest, ice, and elevation. The patient will be weightbearing in a post op shoe to the operative limb until further instructed. The dressing is to remain clean, dry, and intact.   Ames Coupe, DPM

## 2022-03-04 NOTE — Transfer of Care (Signed)
Immediate Anesthesia Transfer of Care Note  Patient: Linda [name]  Procedure(s) Performed: AMPUTATION TOE RIGHT FOOT PARTIAL OR TOTAL GREAT TOE AND SECOND TOE, LEFT FOOT PARTIAL OR TOTAL GREAT TOE, SECOND AND THIRD TOE (Bilateral: Toe)  Patient Location: PACU  Anesthesia Type:General  Level of Consciousness: awake, drowsy, and patient cooperative  Airway & Oxygen Therapy: Patient Spontanous Breathing and Patient connected to face mask oxygen  Post-op Assessment: Report given to RN and Post -op Vital signs reviewed and stable  Post vital signs: Reviewed and stable  Last Vitals:  Vitals Value Taken Time  BP 114/60 03/04/22 0900  Temp    Pulse 92 03/04/22 0902  Resp 11 03/04/22 0903  SpO2 97 % 03/04/22 0902  Vitals shown include unvalidated device data.  Last Pain:  Vitals:   03/04/22 0654  TempSrc:   PainSc: 0-No pain         Complications: No notable events documented.

## 2022-03-04 NOTE — Anesthesia Postprocedure Evaluation (Signed)
Anesthesia Post Note  Patient: Linda [name]  Procedure(s) Performed: AMPUTATION TOE RIGHT FOOT PARTIAL OR TOTAL GREAT TOE AND SECOND TOE, LEFT FOOT PARTIAL OR TOTAL GREAT TOE, SECOND AND THIRD TOE (Bilateral: Toe)     Patient location during evaluation: PACU Anesthesia Type: General Level of consciousness: sedated and patient cooperative Pain management: pain level controlled Vital Signs Assessment: post-procedure vital signs reviewed and stable Respiratory status: spontaneous breathing Cardiovascular status: stable Anesthetic complications: no   No notable events documented.  Last Vitals:  Vitals:   03/04/22 0930 03/04/22 0945  BP: (!) 97/50 (!) 107/57  Pulse: 84 87  Resp: 12 10  Temp:    SpO2: 95% 95%    Last Pain:  Vitals:   03/04/22 0945  TempSrc:   PainSc: 0-No pain                 Nolon Nations

## 2022-03-04 NOTE — Anesthesia Procedure Notes (Signed)
Procedure Name: Intubation Date/Time: 03/04/2022 7:47 AM  Performed by: Reeves Dam, CRNAPre-anesthesia Checklist: Patient identified, Emergency Drugs available, Suction available and Patient being monitored Patient Re-evaluated:Patient Re-evaluated prior to induction Oxygen Delivery Method: Circle system utilized Preoxygenation: Pre-oxygenation with 100% oxygen Induction Type: IV induction and Rapid sequence Laryngoscope Size: Glidescope and 3 Grade View: Grade I Tube type: Oral Tube size: 7.5 mm Number of attempts: 1 Airway Equipment and Method: Stylet and Oral airway Placement Confirmation: ETT inserted through vocal cords under direct vision, positive ETCO2 and breath sounds checked- equal and bilateral Secured at: 21 cm Tube secured with: Tape Dental Injury: Teeth and Oropharynx as per pre-operative assessment

## 2022-03-04 NOTE — Brief Op Note (Signed)
03/04/2022  8:59 AM  PATIENT:  Linda [name]  52 y.o. female  PRE-OPERATIVE DIAGNOSIS:  OSTEOMYELITIS BILATERAL  POST-OPERATIVE DIAGNOSIS:  OSTEOMYELITIS BILATERAL  PROCEDURE:  Procedure(s): AMPUTATION TOE RIGHT FOOT PARTIAL OR TOTAL GREAT TOE AND SECOND TOE, LEFT FOOT PARTIAL OR TOTAL GREAT TOE, SECOND AND THIRD TOE (Bilateral)  SURGEON:  Surgeon(s) and Role:    * Katherin Ramey, Nena Christelle Igoe, DPM - Primary  PHYSICIAN ASSISTANT:   ASSISTANTS: none   ANESTHESIA:   general  EBL:  10 mL   BLOOD ADMINISTERED:none  DRAINS: none   LOCAL MEDICATIONS USED:  MARCAINE    and LIDOCAINE   SPECIMEN:  Source of Specimen:  Toes 1-3 Left foot, Toes 1-2 right foot for pathology. Bone culture right 2nd toe, deep swab culture R 2nd toe  DISPOSITION OF SPECIMEN:   Pathology and Micro  COUNTS:  YES  TOURNIQUET:   Total Tourniquet Time Documented: Calf (N/A) - [id number] minutes Calf (N/A) - 16 minutes Total: Calf (N/A) - [id number] minutes   DICTATION: .Note written in EPIC  PLAN OF CARE: Discharge to home after PACU  PATIENT DISPOSITION:  PACU - hemodynamically stable.   Delay start of Pharmacological VTE agent (>24hrs) due to surgical blood loss or risk of bleeding: yes

## 2022-03-05 ENCOUNTER — Encounter (HOSPITAL_COMMUNITY): Payer: Self-pay | Admitting: Podiatry

## 2022-03-07 LAB — SURGICAL PATHOLOGY

## 2022-03-09 LAB — AEROBIC/ANAEROBIC CULTURE W GRAM STAIN (SURGICAL/DEEP WOUND)
Culture: NO GROWTH
Gram Stain: NONE SEEN

## 2022-03-10 ENCOUNTER — Other Ambulatory Visit: Payer: Self-pay

## 2022-03-10 ENCOUNTER — Encounter: Payer: Self-pay | Admitting: Podiatry

## 2022-03-10 DIAGNOSIS — L03119 Cellulitis of unspecified part of limb: Secondary | ICD-10-CM

## 2022-03-10 LAB — AEROBIC/ANAEROBIC CULTURE W GRAM STAIN (SURGICAL/DEEP WOUND): Gram Stain: NONE SEEN

## 2022-03-11 ENCOUNTER — Ambulatory Visit (INDEPENDENT_AMBULATORY_CARE_PROVIDER_SITE_OTHER): Payer: PPO | Admitting: Podiatry

## 2022-03-11 ENCOUNTER — Other Ambulatory Visit: Payer: Self-pay | Admitting: Family Medicine

## 2022-03-11 DIAGNOSIS — M869 Osteomyelitis, unspecified: Secondary | ICD-10-CM

## 2022-03-11 DIAGNOSIS — Z89411 Acquired absence of right great toe: Secondary | ICD-10-CM

## 2022-03-11 DIAGNOSIS — Z89412 Acquired absence of left great toe: Secondary | ICD-10-CM | POA: Diagnosis not present

## 2022-03-11 DIAGNOSIS — E1142 Type 2 diabetes mellitus with diabetic polyneuropathy: Secondary | ICD-10-CM | POA: Diagnosis not present

## 2022-03-11 MED ORDER — DOXYCYCLINE HYCLATE 100 MG PO TABS: 100.0000 mg | ORAL_TABLET | Freq: Two times a day (BID) | ORAL | 0 refills | Status: AC

## 2022-03-11 MED ORDER — CARIPRAZINE HCL 3 MG PO CAPS: 3.0000 mg | ORAL_CAPSULE | Freq: Every day | ORAL | 1 refills | Status: DC

## 2022-03-11 MED ORDER — TRESIBA FLEXTOUCH 200 UNIT/ML ~~LOC~~ SOPN: 60.0000 [IU] | PEN_INJECTOR | Freq: Every day | SUBCUTANEOUS | 3 refills | Status: DC

## 2022-03-11 NOTE — Progress Notes (Signed)
Subjective:  Patient ID: Linda [name], female    DOB: Sep 23, 1970,  MRN: [id number]  Chief Complaint  Patient presents with   Routine Post Op    POV # 1 DOS 03/04/22 --- RIGHT FOOT PARTIAL OR TOTAL GREAT TOE AND 2ND TOE AMPUTATION, LEFT FOOT PARTIAL OR TOTAL GREAT TOE, 2ND TOE, 3RD TOE AMPUTATION    DOS: 03/04/2022 Procedure: Right foot second toe amputation at MPJ level and hallux amputation at mid proximal phalanx level.  Left foot hallux amputation at MPJ level and second and third toe amputation at mid proximal phalanx level  52 y.o. female returns for post-op check.  Patient returns for first postoperative check after multiple toe amputations 1 week ago.  She has kept the dressing clean dry and intact to both feet as instructed.  She has been weightbearing as tolerated in postoperative shoe.  She did notice some bleeding in the left foot especially.  Does have a history of low platelet count which was known preoperatively.  She says the bleeding has since stopped and no issues at this time.  She denies pain in either foot.  She finished a course of clindamycin which she was taking.  Wound cultures taken from the OR did grow out staph epidermis which was resistant to clindamycin.  Patient denies any nausea vomiting fever chills.  Review of Systems: Negative except as noted in the HPI. Denies N/V/F/Ch.   Objective:  There were no vitals filed for this visit. There is no height or weight on file to calculate BMI. Constitutional Well developed. Well nourished.  Vascular Foot warm and well perfused. Capillary refill normal to all digits.  Calf is soft and supple, no posterior calf or knee pain, negative Homans' sign  Neurologic Normal speech. Oriented to person, place, and time. Epicritic sensation to light touch grossly present bilaterally.  Dermatologic On the right foot both amputation sites healing well with no dehiscence no maceration no erythema and minimal edema.  On the left  foot there is noted to be some maceration about the hallux amputation site with mild surrounding erythema.  No frank dehiscence and no drainage expressed.      Orthopedic: Tenderness to palpation noted about the surgical site.   Multiple view plain film radiographs: X-ray deferred at this visit we will consider again in the future if any issues with wound healing Assessment:   1. Osteomyelitis of toe of right foot (Linda [name])   2. Osteomyelitis of toe of left foot (Linda [name])   3. 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 DM type 2 with diabetic peripheral neuropathy (Linda [name])    Plan:  Patient was evaluated and treated and all questions answered.  S/p foot surgery bilaterally including first and second toe amputation of the right foot and first - third toe amputation on the left foot -Progressing as expected post-operatively.  Right side very stable at this time left side with mild maceration but no frank dehiscence or evidence of necrosis -XR: Deferred at this visit -WB Status: Weightbearing as tolerated in postoperative shoe to bilateral foot -Sutures: To remain intact until next visit at least. -Medications: E Rx for doxycycline 100 mg twice daily for another 10 days. -Foot redressed with Betadine Adaptic and dry gauze dressing. -Referral for home health care placed at this visit per patient request that she feels she does not have enough help to do the dressing changes on her own when she will need to coming up here soon.  Recommend that we proceed with home health care for skilled nursing for monitoring and dressing changes 2X weekly.  Return in about 1 week (around 03/18/2022).         Everitt Amber, DPM [facility]

## 2022-03-18 ENCOUNTER — Telehealth: Payer: Self-pay

## 2022-03-18 NOTE — Telephone Encounter (Signed)
Called patient to schedule Medicare Annual Wellness Visit (AWV). Left message for patient to call back and schedule Medicare Annual Wellness Visit (AWV).  Last date of AWV: 04/23/20  Please schedule an appointment at any time with Shelle Iron, LPN.  Thanks, Norton Blizzard, [facility]  [facility] [phone]

## 2022-03-19 DIAGNOSIS — Z4800 Encounter for change or removal of nonsurgical wound dressing: Secondary | ICD-10-CM | POA: Diagnosis not present

## 2022-03-21 ENCOUNTER — Ambulatory Visit: Payer: Self-pay | Admitting: Licensed Clinical Social Worker

## 2022-03-21 NOTE — Patient Instructions (Signed)
Visit Information  Thank you for taking time to visit with me today. Please don't hesitate to contact me if I can be of assistance to you before our next scheduled telephone appointment.  Following are the goals we discussed today:    Our next appointment is by telephone on 04/04/22 at 11:00 AM  Please call the care guide team at [phone] if you need to cancel or reschedule your appointment.   If you are experiencing a Mental Health or [place] or need someone to talk to, please go to [facility] [phone])   Following is a copy of your full plan of care:   Interventions:  Patient is interested in program support . She spoke of medical needs and transport needs.  She feels that program support would be helpful to her at this time.  She said she is having reduced family support at present Client called [facility] but did not hear back related to transport help. LCSW encouraged client to call [facility] again to request call back to seek transport help support Reviewed ambulation of client. Client is at risk for falls.  She uses rollator walker to help her ambulate Discussed her support from PCP and from podiatrist. Client recently had surgery on her feet. She has bandages on her feet now and has had some help with wrapping her feet. She has appointment with surgeon tomorrow and will talk with surgeon about home health support for client Reviewed mood issues of client.  Client said she has been sad and more depressed recently concerned about her recent surgery and foot issues. Provided counseling support for client.  LCSW encouraged Zariya to talk regularly with her PCP and Podiatrist regarding client foot/toe care needs Reviewed financial needs. She receives monthly Disability check.  Encouraged client  previously to contact local [employer] for food support Encouraged Zelah to call LCSW as needed for SW support at  [phone]  Ms. Kenkel was given information about Care Management services by the embedded care coordination team including:  Care Management services include personalized support from designated clinical staff supervised by her physician, including individualized plan of care and coordination with other care providers 24/7 contact phone numbers for assistance for urgent and routine care needs. The patient may stop CCM services at any time (effective at the end of the month) by phone call to the office staff.  Patient agreed to services and verbal consent obtained.   Norva Riffle.Shirel Mallis MSW, [place] [employer] representative [facility] Care Management [phone]

## 2022-03-21 NOTE — Patient Outreach (Signed)
  Care Coordination   Follow Up Visit Note   03/21/2022 Name: Linda [name] MRN: [id number] DOB: [birth year]  Linda [name] is a 52 y.o. year old female who sees [name], Elnita Maxwell, MD for primary care. I spoke with  Linda [name] by phone today.  What matters to the patients health and wellness today?   Patient is interested in program support.  She spoke of medical needs and of transport needs    Goals Addressed               This Visit's Progress     patient is interested in program support. she spoke of medical needs and transport needs (pt-stated)        Interventions:  Patient is interested in program support . She spoke of medical needs and transport needs.  She feels that program support would be helpful to her at this time.  She said she is having reduced family support at present Client called [facility] but did not hear back related to transport help. LCSW encouraged client to call [facility] again to request call back to seek transport help support Reviewed ambulation of client. Client is at risk for falls.  She uses rollator walker to help her ambulate Discussed her support from PCP and from podiatrist. Client recently had surgery on her feet. She has bandages on her feet now and has had some help with wrapping her feet. She has appointment with surgeon tomorrow and will talk with surgeon about home health support for client Reviewed mood issues of client.  Client said she has been sad and more depressed recently concerned about her recent surgery and foot issues. Provided counseling support for client.  LCSW encouraged Linda [name] to talk regularly with her PCP and Podiatrist regarding client foot/toe care needs Reviewed financial needs. She receives monthly Disability check.  Encouraged client  previously to contact local [employer] Banks for food support Encouraged Linda [name] to call LCSW as needed for SW support at [phone]     SDOH assessments and interventions completed:   Yes  SDOH Interventions Today    Flowsheet Row Most Recent Value  SDOH Interventions   Depression Interventions/Treatment  Counseling, Medication  Physical Activity Interventions Other (Comments)  [recent surgery on her feet. uses walker to ambulate]  Stress Interventions Provide Counseling  [client has stress related to managing medical needs]        Care Coordination Interventions:  Yes, provided   Interventions Today    Flowsheet Row Most Recent Value  Chronic Disease   Chronic disease during today's visit Other  [discussed recent surgery of client]  General Interventions   General Interventions Discussed/Reviewed General Interventions Discussed  [discussed program support for client. Discussed [facility] support with transport help]  Exercise Interventions   Exercise Discussed/Reviewed Physical Activity  [uses walker to help her walk]  Education Interventions   Education Provided Provided Education  Provided Verbal Education On [place] Discussed/Reviewed Anxiety, Coping Strategies  [discussed mood of client  and client safety issues]  Safety Interventions   Safety Discussed/Reviewed Fall Risk  [discussed walking issues of client]        Follow up plan: Follow up call scheduled for 04/04/22 at 11:00 AM    Encounter Outcome:  Pt. Visit Completed   Norva Riffle.Delorese Sellin MSW, [place] Holiday representative [facility] Care Management [phone]

## 2022-03-22 ENCOUNTER — Ambulatory Visit (INDEPENDENT_AMBULATORY_CARE_PROVIDER_SITE_OTHER): Payer: PPO | Admitting: Podiatry

## 2022-03-22 ENCOUNTER — Other Ambulatory Visit: Payer: Self-pay | Admitting: Family Medicine

## 2022-03-22 DIAGNOSIS — M869 Osteomyelitis, unspecified: Secondary | ICD-10-CM

## 2022-03-22 DIAGNOSIS — Z1501 Genetic susceptibility to malignant neoplasm of breast: Secondary | ICD-10-CM

## 2022-03-22 DIAGNOSIS — G2581 Restless legs syndrome: Secondary | ICD-10-CM

## 2022-03-22 DIAGNOSIS — Z89412 Acquired absence of left great toe: Secondary | ICD-10-CM

## 2022-03-22 DIAGNOSIS — Z89411 Acquired absence of right great toe: Secondary | ICD-10-CM | POA: Diagnosis not present

## 2022-03-22 DIAGNOSIS — E782 Mixed hyperlipidemia: Secondary | ICD-10-CM

## 2022-03-22 DIAGNOSIS — F5081 Binge eating disorder: Secondary | ICD-10-CM

## 2022-03-22 NOTE — Progress Notes (Signed)
Subjective:  Patient ID: Linda [name], female    DOB: 1971-01-19,  MRN: [id number]  Chief Complaint  Patient presents with   Routine Post Op    POV # 2 DOS 03/04/22 --- RIGHT FOOT PARTIAL OR TOTAL GREAT TOE AND 2ND TOE AMPUTATION, LEFT FOOT PARTIAL OR TOTAL GREAT TOE, 2ND TOE, 3RD TOE AMPUTATION    DOS: 03/04/2022 Procedure: Right foot second toe amputation at MPJ level and hallux amputation at mid proximal phalanx level.  Left foot hallux amputation at MPJ level and second and third toe amputation at mid proximal phalanx level  52 y.o. female returns for second post-op check.  Patient returns for first postoperative check after multiple toe amputations appraoching 2 week ago.  She has kept the dressing clean dry and intact to both feet as instructed.  She did have a problem where the right foot dressing came undone and she went to urgent care to get rewrapped.  She has been weightbearing as tolerated in postoperative shoe.  She has had some dressing drainage on the left side however not on the right.  She is having a little bit of pain on the right foot but no pain in the left foot.  Continuing to take antibiotics postop.    Review of Systems: Negative except as noted in the HPI. Denies N/V/F/Ch.   Objective:  There were no vitals filed for this visit. There is no height or weight on file to calculate BMI. Constitutional Well developed. Well nourished.  Vascular Foot warm and well perfused. Capillary refill normal to all digits.  Calf is soft and supple, no posterior calf or knee pain, negative Homans' sign  Neurologic Normal speech. Oriented to person, place, and time. Epicritic sensation to light touch grossly present bilaterally.  Dermatologic On the right foot both amputation sites healing well with no dehiscence no maceration no erythema and minimal edema.  On the left foot there is noted to be some decreased maceration at the left hallux amputation site with minimal  surrounding erythema no frank dehiscence eschar present at the amputation line of all 3 toes on the left foot         Orthopedic: Tenderness to palpation noted about the surgical site.   Multiple view plain film radiographs: X-ray deferred at this visit we will consider again in the future if any issues with wound healing Assessment:   1. Osteomyelitis of toe of right foot ([place])   2. Osteomyelitis of toe of left foot ([place])   3. 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  S/p foot surgery bilaterally including first and second toe amputation of the right foot and first - third toe amputation on the left foot -Progressing as expected post-operatively.  Right side very stable at this time left side improving with decreased maceration -XR: Deferred at this visit -WB Status: Weightbearing as tolerated in postoperative shoe to bilateral foot -Sutures: To remain intact until next visit  -Medications: Continue doxycycline 100 mg twice daily until completed with the previous course of 10-day supply -Foot redressed with Betadine Adaptic and dry gauze dressing. -Referral for home health care placed at this visit per patient request that she feels she does not have enough help to do the dressing changes on her own when she will need to coming up here soon.  Recommend that we proceed with home health care for skilled nursing for monitoring  and dressing changes 2X weekly.  Return in about 10 days (around 04/01/2022) for 3rd POV bilateral foot toe amputation.         Everitt Amber, DPM [facility]

## 2022-03-30 ENCOUNTER — Telehealth: Payer: Self-pay

## 2022-03-30 ENCOUNTER — Telehealth: Payer: HMO

## 2022-03-30 NOTE — Telephone Encounter (Signed)
   CCM RN Visit Note   03-30-2022 Name: Linda [name] MRN: [id number]      DOB: 1970/10/21  Subjective: Linda [name] is a 51 y.o. year old female who is a primary care patient of Dr. [name]. The patient was referred to the Chronic Care Management team for assistance with care management needs subsequent to provider initiation of CCM services and plan of care.      An unsuccessful telephone outreach was attempted today to contact the patient about Chronic Care Management needs.    Plan:A HIPAA compliant phone message was left for the patient providing contact information and requesting a return call.  Noreene Larsson RN, MSN, CCM RN Care Manager  Chronic Care Management Direct Number: [phone]

## 2022-03-31 ENCOUNTER — Ambulatory Visit: Payer: HMO | Admitting: Podiatry

## 2022-04-01 ENCOUNTER — Ambulatory Visit (INDEPENDENT_AMBULATORY_CARE_PROVIDER_SITE_OTHER): Payer: PPO | Admitting: Podiatry

## 2022-04-01 ENCOUNTER — Telehealth: Payer: Self-pay | Admitting: *Deleted

## 2022-04-01 DIAGNOSIS — Z89412 Acquired absence of left great toe: Secondary | ICD-10-CM | POA: Diagnosis not present

## 2022-04-01 DIAGNOSIS — M869 Osteomyelitis, unspecified: Secondary | ICD-10-CM

## 2022-04-01 DIAGNOSIS — Z89411 Acquired absence of right great toe: Secondary | ICD-10-CM

## 2022-04-01 DIAGNOSIS — E1142 Type 2 diabetes mellitus with diabetic polyneuropathy: Secondary | ICD-10-CM | POA: Diagnosis not present

## 2022-04-01 DIAGNOSIS — Z9889 Other specified postprocedural states: Secondary | ICD-10-CM

## 2022-04-01 NOTE — Progress Notes (Signed)
Subjective:  Patient ID: [name], female    DOB: 06-26-1970,  MRN: [id number]  Chief Complaint  Patient presents with   Routine Post Op    POV # 3 DOS 03/04/22 --- RIGHT FOOT PARTIAL OR TOTAL GREAT TOE AND 2ND TOE AMPUTATION, LEFT FOOT PARTIAL OR TOTAL GREAT TOE, 2ND TOE, 3RD TOE AMPUTATION    DOS: 03/04/2022 Procedure: Right foot second toe amputation at MPJ level and hallux amputation at mid proximal phalanx level.  Left foot hallux amputation at MPJ level and second and third toe amputation at mid proximal phalanx level  52 y.o. female returns for third post-op check.  Patient returns for first postoperative check after multiple toe amputations appraoching 4 week ago.  Patient reports she has been doing well.  She denies nausea vomiting fever chills.  She has kept the dressing intact since last appointment.  She is walking in postop shoes bilaterally.  Review of Systems: Negative except as noted in the HPI. Denies N/V/F/Ch.   Objective:  There were no vitals filed for this visit. There is no height or weight on file to calculate BMI. Constitutional Well developed. Well nourished.  Vascular Foot warm and well perfused. Capillary refill normal to all digits.  Calf is soft and supple, no posterior calf or knee pain, negative Homans' sign  Neurologic Normal speech. Oriented to person, place, and time. Epicritic sensation to light touch grossly present bilaterally.  Dermatologic On the right foot both amputation sites healing well with no dehiscence no maceration no erythema and minimal edema.  On the left foot at the hallux amputation site there is a small thin area of superficial ulceration at site of amputation however no deep dehiscence.  It is still ultimately overall well coapted and healing.  The remainder of the amputation sites at toes 2 3 on the left and 1 and 2 on the right are well-healed at this time with some dry eschar but no dehiscence erythema edema or drainage.   Orthopedic: No tenderness to palpation noted about the surgical site.   Multiple view plain film radiographs: X-ray deferred at this visit we will consider again in the future if any issues with wound healing Assessment:   1. Osteomyelitis of toe of left foot ([place])   2. Osteomyelitis of toe of right foot ([place])   3. History of amputation of great toe of both feet ([place])   4. Post-operative state   5. DM type 2 with diabetic peripheral neuropathy ([place])      Plan:  Patient was evaluated and treated and all questions answered.  S/p foot surgery bilaterally including first and second toe amputation of the right foot and first - third toe amputation on the left foot -Progressing as expected post-operatively.  Right side fully healed at this time.  Left side continuing to heal especially at the hallux amputation site though continuing to improve -XR: Deferred at this visit -WB Status: Weightbearing as tolerated in postoperative shoe to bilateral foot -Sutures: Were removed at this visit  -begin every twice to 3 times weekly dressing changes to the left foot.  The right foot does not need to be redressed with anything.  Recommend Betadine to the left hallux amputation site and cover with Band-Aids.  Surgical shoe applied at this visit -Medications: No further antibiotics indicated at this time -Foot redressed with Betadine Steri-Strip and adhesive bandage dressing -Referral for home health care placed at this visit per patient request that she feels she does not have  enough help to do the dressing changes on her own when she will need to coming up here soon.  Recommend that we proceed with home health care for skilled nursing for monitoring and dressing changes 2X weekly.  No follow-ups on file.         Everitt Amber, DPM [facility]

## 2022-04-01 NOTE — Telephone Encounter (Signed)
[facility] w/ Health Team Adv. Is calling to ask that we send home health order be sent  to [facility], faxed to their facility,confirmation received-04/01/22.

## 2022-04-04 ENCOUNTER — Ambulatory Visit: Payer: Self-pay | Admitting: Licensed Clinical Social Worker

## 2022-04-04 NOTE — Patient Outreach (Signed)
  Care Coordination   Follow Up Visit Note   04/04/2022 Name: Linda [name] MRN: [id number] DOB: 1970-11-14  Linda [name] is a 52 y.o. year old female who sees [name], Elnita Maxwell, MD for primary care. I spoke with  Linda [name] by phone today.  What matters to the patients health and wellness today? Client has medical needs, transport needs.     Goals Addressed               This Visit's Progress     patient is interested in program support. she spoke of medical needs and transport needs (pt-stated)        Interventions:  LCSW spoke with client via phone about client needs. Client has gone to appointments with podiatrist to monitor food care issues LCSW and client spoke of home health support. She has been talking with RN from [employer]. She plans to continue talking with this RN from [place] to see if RN can help her get set up with home health support. LCSW and client spoke of her use of supplies for care needs of her feet following surgery Spoke of social support. She has a friends with whom she communicates regularly.  Spoke of transport needs. She drives herself for short trips. She plans to contact [facility] to talk more with them about trnasport help to and from her medical appointments Reviewed ambulation of client. Client is at risk for falls.  She uses rollator walker to help her ambulate Reviewed mood issues of client.  Client said she has been sad and more depressed recently concerned about her recent surgery and foot issues. She said she is taking Buspar as prescribed. LCSW talked with client about her calling family and friends to have social contact support as needed. Provided counseling support for client.  Encouraged Linda [name] to call LCSW as needed for SW support at [phone]     SDOH assessments and interventions completed:  Yes  SDOH Interventions Today    Flowsheet Row Most Recent Value  SDOH Interventions   Depression  Interventions/Treatment  Medication, Counseling  Physical Activity Interventions Other (Comments)  [uses a walker to help her walk]  Stress Interventions Provide Counseling  [client has stress related to managing medical needs]        Care Coordination Interventions:  Yes, provided   Interventions Today    Flowsheet Row Most Recent Value  Chronic Disease   Chronic disease during today's visit Other  [discussed in home care needs of client]  General Interventions   General Interventions Discussed/Reviewed General Interventions Discussed, [employer]  [discussed home health support options]  Education Interventions   Education Provided Provided Education  Provided Verbal Education On [employer]  Mental Health Interventions   Mental Health Discussed/Reviewed Anxiety, Coping Strategies  [discussed mood issues of client]        Follow up plan: Follow up call scheduled for 04/26/22 at 10:30 AM   Encounter Outcome:  Pt. Visit Completed   Norva Riffle.Kessler Kopinski MSW, [place] Holiday representative [facility] Care Management [phone]

## 2022-04-04 NOTE — Patient Instructions (Signed)
Visit Information  Thank you for taking time to visit with me today. Please don't hesitate to contact me if I can be of assistance to you before our next scheduled telephone appointment.  Following are the goals we discussed today:    Our next appointment is by telephone on 04/26/22 at 10:30 AM   Please call the care guide team at [phone] if you need to cancel or reschedule your appointment.   If you are experiencing a Mental Health or [place] or need someone to talk to, please go to [facility] [place] [phone])   Following is a copy of your full plan of care:      Interventions :     LCSW spoke with client via phone about client needs. Client has gone to appointments with podiatrist to monitor food care issues LCSW and client spoke of home health support. She has been talking with RN from [employer]. She plans to continue talking with this RN from [place] to see if RN can help her get set up with home health support. LCSW and client spoke of her use of supplies for care needs of her feet following surgery Spoke of social support. She has a friends with whom she communicates regularly.  Spoke of transport needs. She drives herself for short trips. She plans to contact [facility] to talk more with them about trnasport help to and from her medical appointments Reviewed ambulation of client. Client is at risk for falls.  She uses rollator walker to help her ambulate Reviewed mood issues of client.  Client said she has been sad and more depressed recently concerned about her recent surgery and foot issues. She said she is taking Buspar as prescribed. LCSW talked with client about her calling family and friends to have social contact support as needed. Provided counseling support for client.  Encouraged [name] to call LCSW as needed for SW support at [phone]  [name] was given information  about Care Management services by the embedded care coordination team including:  Care Management services include personalized support from designated clinical staff supervised by her physician, including individualized plan of care and coordination with other care providers 24/7 contact phone numbers for assistance for urgent and routine care needs. The patient may stop CCM services at any time (effective at the end of the month) by phone call to the office staff.  Patient agreed to services and verbal consent obtained.   Norva Riffle.Linda [name] MSW, [place] Holiday representative [facility] Care Management [phone]

## 2022-04-06 ENCOUNTER — Telehealth: Payer: Self-pay

## 2022-04-06 NOTE — Telephone Encounter (Signed)
[facility] call to have home health referral faxed to [phone]

## 2022-04-06 NOTE — Telephone Encounter (Signed)
[facility] called and stated that they unable to provide services for the patient's dressing change.

## 2022-04-11 ENCOUNTER — Telehealth: Payer: Self-pay

## 2022-04-11 NOTE — Progress Notes (Unsigned)
  Chronic Care Management Note  04/11/2022 Name: Linda [name] MRN: [id number] DOB: May 16, 1970  Linda [name] is a 52 y.o. year old female who is a primary care patient of [name], Kirsten, MD and is actively engaged with the Chronic Care Management team. I reached out to Linda [name] by phone today to assist with re-scheduling a follow up visit with the RN Case Manager  Follow up plan: Unsuccessful telephone outreach attempt made. A HIPAA compliant phone message was left for the patient providing contact information and requesting a return call.  The care management team will reach out to the patient again over the next 7 days.  If patient returns call to provider office, please advise to call [facility]  at [place], [place], [place] [address] Direct Dial: [phone] [name]

## 2022-04-12 ENCOUNTER — Telehealth: Payer: Self-pay

## 2022-04-13 ENCOUNTER — Telehealth: Payer: HMO

## 2022-04-13 ENCOUNTER — Telehealth: Payer: Self-pay | Admitting: *Deleted

## 2022-04-13 ENCOUNTER — Ambulatory Visit (INDEPENDENT_AMBULATORY_CARE_PROVIDER_SITE_OTHER): Payer: PPO

## 2022-04-13 DIAGNOSIS — E1165 Type 2 diabetes mellitus with hyperglycemia: Secondary | ICD-10-CM

## 2022-04-13 DIAGNOSIS — F330 Major depressive disorder, recurrent, mild: Secondary | ICD-10-CM

## 2022-04-13 DIAGNOSIS — E782 Mixed hyperlipidemia: Secondary | ICD-10-CM

## 2022-04-13 DIAGNOSIS — E1159 Type 2 diabetes mellitus with other circulatory complications: Secondary | ICD-10-CM

## 2022-04-13 NOTE — Telephone Encounter (Signed)
Heather w/ [facility] Adv. Has reached out to  [facility] to  accept patient for home Health at this time.

## 2022-04-13 NOTE — Chronic Care Management (AMB) (Signed)
Chronic Care Management   CCM RN Visit Note  04/13/2022 Name: Linda [name] MRN: [id number] DOB: 05-Nov-1970  Subjective: Linda [name] is a 52 y.o. year old female who is a primary care patient of [name], Kirsten, MD. The patient was referred to the Chronic Care Management team for assistance with care management needs subsequent to provider initiation of CCM services and plan of care.    Today's Visit:  Engaged with patient by telephone for follow up visit.        Goals Addressed             This Visit's Progress    CCM Expected Outcome:  Monitor, Self-Manage and Reduce Symptoms of Diabetes       Current Barriers:  Knowledge Deficits related to concerns about places on her toes and not being able to be seen by podiatrist until next week, the patient expressed concern over being diabetic and not wanting to lose her toes Care Coordination needs related to resources in the area to help with her expressed needs and caring for her self due to lack of support system in a patient with DM Chronic Disease Management support and education needs related to effective management of DM Lacks caregiver support.  Lab Results  Component Value Date   HGBA1C 7.4 (H) 01/06/2022     Planned Interventions: Provided education to patient about basic DM disease process. The patient states that her blood sugars have been up and down. She is post surgical removal of 5 of her toes on bilateral feet. She states her right foot has completely healed. She is still having to wrap her left foot but it is healing. Denies any sx and sx of infection. Review of monitoring for changes and to call the provider for changes. Review falls and safety concerns when ambulating. The patient states she is happy that she is doing better as she was really concerned about her feet.  Reviewed medications with patient and discussed importance of medication adherence. The patient is compliant with her medications. Denies  any acute changes in her medications. ;        Reviewed prescribed diet with patient heart healthy/ADA diet ; Counseled on importance of regular laboratory monitoring as prescribed. Has regular labwork ;        Discussed plans with patient for ongoing care management follow up and provided patient with direct contact information for care management team;      Provided patient with written educational materials related to hypo and hyperglycemia and importance of correct treatment. The lowest the patient has seen is 69 and the highest is 200. ;       Reviewed scheduled/upcoming provider appointments including: 04-26-2022 at 10 am with the pcp;         Advised patient, providing education and rationale, to check cbg when you have symptoms of low or high blood sugar and has a continuous glucose reader, freestyle [place]  and record. The patient states that her blood sugars have been up and down. Range is usually around 100-200.        call provider for findings outside established parameters;       Referral made to pharmacy team for assistance with ongoing support and education for medication needs ;       Referral made to social work team for assistance with ongoing support and education for effective management of stress, anxiety, depression ;      Referral made to community resources care guide team  for assistance with food resources and community resources;      Review of patient status, including review of consultants reports, relevant laboratory and other test results, and medications completed;       Advised patient to discuss changes in her DM and questions and concerns with provider;      Screening for signs and symptoms of depression related to chronic disease state;        Assessed social determinant of health barriers;         Symptom Management: Take medications as prescribed   Attend all scheduled provider appointments Call provider office for new concerns or questions  call the Suicide and  Crisis Lifeline: 988 call the [place] National Suicide Prevention Lifeline: 873-521-0068 or TTY: 616-734-2550 TTY 786-159-9886) to talk to a trained counselor call 1-800-273-TALK (toll free, 24 hour hotline) if experiencing a Mental Health or [place]  keep appointment with eye doctor check feet daily for cuts, sores or redness trim toenails straight across manage portion size wash and dry feet carefully every day wear comfortable, cotton socks wear comfortable, well-fitting shoes  Follow Up Plan: Telephone follow up appointment with care management team member scheduled for: 04-26-2022 at 10am       CCM Expected Outcome:  Monitor, Self-Manage and Reduce Symptoms of HLD       Current Barriers:  Chronic Disease Management support and education needs related to effective management of HLD Lab Results  Component Value Date   CHOL 111 01/06/2022   HDL 35 (L) 01/06/2022   LDLCALC 54 01/06/2022   TRIG 120 01/06/2022   CHOLHDL 3.2 01/06/2022     Planned Interventions: Provider established cholesterol goals reviewed; Counseled on importance of regular laboratory monitoring as prescribed; Provided HLD educational materials; Reviewed role and benefits of statin for ASCVD risk reduction; Discussed strategies to manage statin-induced myalgias. The patient takes Lipitor 10 mg QD; Reviewed importance of limiting foods high in cholesterol. Education and support provided on a heart healthy/ADA diet. The patient tries to watch what she is eating. ; Screening for signs and symptoms of depression related to chronic disease state;  Assessed social determinant of health barriers;   Symptom Management: Take medications as prescribed   Attend all scheduled provider appointments Call provider office for new concerns or questions  call the Suicide and Crisis Lifeline: 988 call the [place] National Suicide Prevention Lifeline: 351-229-4658 or TTY: (737) 205-5413 TTY (551)463-1526) to talk  to a trained counselor call 1-800-273-TALK (toll free, 24 hour hotline) if experiencing a Mental Health or [place]  - take all medications exactly as prescribed - call doctor with any symptoms you believe are related to your medicine - call doctor when you experience any new symptoms - go to all doctor appointments as scheduled - adhere to prescribed diet: heart healthy/ADA diet   Follow Up Plan: Telephone follow up appointment with care management team member scheduled for: 06-09-2022 at 1145 am       CCM Expected Outcome:  Monitor, Self-Manage and Reduce Symptoms of: Depression       Current Barriers:  Knowledge Deficits related to how to manage emotions and stress level in a patient with multiple chronic conditions including depression Care Coordination needs related to resources in the community for food insecurities and additional support for social worker support  in a patient with depression and increased stress level Chronic Disease Management support and education needs related to effective management of depression  Lacks caregiver support.   Planned  Interventions: Evaluation of current treatment plan related to depression  and patient's adherence to plan as established by provider. The patient states she has good days and bad days. She states that some days she feels like she can do anything but her health limits her in what she can do. Some days she does not feel like getting up out of the bed and sometimes that is because of the pain she has. She is having a good day today and thankful her feet are healing where she had to have several toes amputated. Reflective listening and support given.  Advised patient to call the office for changes in mood, anxiety, depression, stress level, or mental health needs  Provided education to patient re: doing mindfulness activities, journal writing, creative ways to help her when she is stressed out. The patient is doing much better  and her feet are healing. She states that she is having good days and some bad days. Overall she is in good spirits and is thankful for the support of her providers. She sees providers on a regular basis.  Reviewed medications with patient and discussed compliance. States she is compliant with medications Collaborated with LCSW and scheduling care guides regarding the need for follow up by LCSW. The LCSW has worked with the patient before for depression and other needs Provided patient with resources and support for effective management of depression  educational materials related to managing her stress level and depression  Reviewed scheduled/upcoming provider appointments including 04-26-2022 at 10 am Care Guide referral for food insecurities and scheduling follow up with LCSW Social Work referral for support and education needs related to depression and stress. Ongoing support and education from the LCSW.  Discussed plans with patient for ongoing care management follow up and provided patient with direct contact information for care management team Advised patient to discuss changes in her depression and anxiety level, questions and concerns on how to best manage her chronic care needs due to limited support system with provider Screening for signs and symptoms of depression related to chronic disease state  Assessed social determinant of health barriers  Symptom Management: Take medications as prescribed   Attend all scheduled provider appointments Call provider office for new concerns or questions  call the Suicide and Crisis Lifeline: 988 call the [place] National Suicide Prevention Lifeline: 512-853-4853 or TTY: 641-489-5123 TTY (636)874-3315) to talk to a trained counselor call 1-800-273-TALK (toll free, 24 hour hotline) if experiencing a Mental Health or [place]   Follow Up Plan: Telephone follow up appointment with care management team member scheduled for: 06-09-2022 at  1145 am       CCM Expected Outcome:  Monitor, Self-Manage, and Reduce Symptoms of Hypertension       Current Barriers:  Chronic Disease Management support and education needs related to effective management of HTN Lacks caregiver support.   BP Readings from Last 3 Encounters:  03/04/22 (!) 107/57  03/01/22 124/74  02/10/22 105/65    Planned Interventions: Evaluation of current treatment plan related to hypertension self management and patient's adherence to plan as established by provider. The patients blood pressures are stable. She denies any acute findings related to blood pressures. Has follow up with pcp coming up in April. ;   Provided education to patient re: stroke prevention, s/s of heart attack and stroke; Reviewed prescribed diet heart  healthy/ADA diet. The patient is monitoring her dietary intake. Reviewed medications with patient and discussed importance of compliance. The patient states compliance  with medications. ;  Discussed plans with patient for ongoing care management follow up and provided patient with direct contact information for care management team; Advised patient, providing education and rationale, to monitor blood pressure daily and record, calling PCP for findings outside established parameters;  Reviewed scheduled/upcoming provider appointments including: 04-26-2022 at 10 am Advised patient to discuss changes in blood pressures and heart health with provider; Provided education on prescribed diet heart healthy/ADA diet ;  Discussed complications of poorly controlled blood pressure such as heart disease, stroke, circulatory complications, vision complications, kidney impairment, sexual dysfunction;  Screening for signs and symptoms of depression related to chronic disease state;  Assessed social determinant of health barriers;   Symptom Management: Take medications as prescribed   Attend all scheduled provider appointments Call provider office for new  concerns or questions  call the Suicide and Crisis Lifeline: 988 call the [place] National Suicide Prevention Lifeline: 7023388205 or TTY: 970-361-5814 TTY (412)572-9658) to talk to a trained counselor call 1-800-273-TALK (toll free, 24 hour hotline) if experiencing a Mental Health or [place]  check blood pressure weekly learn about high blood pressure call doctor for signs and symptoms of high blood pressure develop an action plan for high blood pressure keep all doctor appointments take medications for blood pressure exactly as prescribed report new symptoms to your doctor  Follow Up Plan: Telephone follow up appointment with care management team member scheduled for: 06-09-2022 at 1145 am          Plan:Telephone follow up appointment with care management team member scheduled for:  06-09-2022 at 1145 am   Noreene Larsson RN, MSN, CCM RN Care Manager  Chronic Care Management Direct Number: [phone]

## 2022-04-13 NOTE — Patient Instructions (Signed)
Please call the care guide team at [phone] if you need to cancel or reschedule your appointment.   If you are experiencing a Mental Health or [place] or need someone to talk to, please call the Suicide and Crisis Lifeline: 988 call the [place] National Suicide Prevention Lifeline: 671-390-9181 or TTY: 250-580-1756 TTY 5306731989) to talk to a trained counselor call 1-800-273-TALK (toll free, 24 hour hotline) go to [facility] [place] [phone])   Following is a copy of the CCM Program Consent:  CCM service includes personalized support from designated clinical staff supervised by the physician, including individualized plan of care and coordination with other care providers 24/7 contact phone numbers for assistance for urgent and routine care needs. Service will only be billed when office clinical staff spend 20 minutes or more in a month to coordinate care. Only one practitioner may furnish and bill the service in a calendar month. The patient may stop CCM services at amy time (effective at the end of the month) by phone call to the office staff. The patient will be responsible for cost sharing (co-pay) or up to 20% of the service fee (after annual deductible is met)  Following is a copy of your full provider care plan:   Goals Addressed             This Visit's Progress    CCM Expected Outcome:  Monitor, Self-Manage and Reduce Symptoms of Diabetes       Current Barriers:  Knowledge Deficits related to concerns about places on her toes and not being able to be seen by podiatrist until next week, the patient expressed concern over being diabetic and not wanting to lose her toes Care Coordination needs related to resources in the area to help with her expressed needs and caring for her self due to lack of support system in a patient with DM Chronic Disease Management support and education needs related to  effective management of DM Lacks caregiver support.  Lab Results  Component Value Date   HGBA1C 7.4 (H) 01/06/2022     Planned Interventions: Provided education to patient about basic DM disease process. The patient states that her blood sugars have been up and down. She is post surgical removal of 5 of her toes on bilateral feet. She states her right foot has completely healed. She is still having to wrap her left foot but it is healing. Denies any sx and sx of infection. Review of monitoring for changes and to call the provider for changes. Review falls and safety concerns when ambulating. The patient states she is happy that she is doing better as she was really concerned about her feet.  Reviewed medications with patient and discussed importance of medication adherence. The patient is compliant with her medications. Denies any acute changes in her medications. ;        Reviewed prescribed diet with patient heart healthy/ADA diet ; Counseled on importance of regular laboratory monitoring as prescribed. Has regular labwork ;        Discussed plans with patient for ongoing care management follow up and provided patient with direct contact information for care management team;      Provided patient with written educational materials related to hypo and hyperglycemia and importance of correct treatment. The lowest the patient has seen is 69 and the highest is 200. ;       Reviewed scheduled/upcoming provider appointments including: 04-26-2022 at 10 am with the  pcp;         Advised patient, providing education and rationale, to check cbg when you have symptoms of low or high blood sugar and has a continuous glucose reader, freestyle [place]  and record. The patient states that her blood sugars have been up and down. Range is usually around 100-200.        call provider for findings outside established parameters;       Referral made to pharmacy team for assistance with ongoing support and education for  medication needs ;       Referral made to social work team for assistance with ongoing support and education for effective management of stress, anxiety, depression ;      Referral made to community resources care guide team for assistance with food resources and community resources;      Review of patient status, including review of consultants reports, relevant laboratory and other test results, and medications completed;       Advised patient to discuss changes in her DM and questions and concerns with provider;      Screening for signs and symptoms of depression related to chronic disease state;        Assessed social determinant of health barriers;         Symptom Management: Take medications as prescribed   Attend all scheduled provider appointments Call provider office for new concerns or questions  call the Suicide and Crisis Lifeline: 988 call the [place] National Suicide Prevention Lifeline: 980-452-7924 or TTY: (915)743-5337 TTY (423)619-1653) to talk to a trained counselor call 1-800-273-TALK (toll free, 24 hour hotline) if experiencing a Mental Health or [place]  keep appointment with eye doctor check feet daily for cuts, sores or redness trim toenails straight across manage portion size wash and dry feet carefully every day wear comfortable, cotton socks wear comfortable, well-fitting shoes  Follow Up Plan: Telephone follow up appointment with care management team member scheduled for: 04-26-2022 at 10am       CCM Expected Outcome:  Monitor, Self-Manage and Reduce Symptoms of HLD       Current Barriers:  Chronic Disease Management support and education needs related to effective management of HLD Lab Results  Component Value Date   CHOL 111 01/06/2022   HDL 35 (L) 01/06/2022   LDLCALC 54 01/06/2022   TRIG 120 01/06/2022   CHOLHDL 3.2 01/06/2022     Planned Interventions: Provider established cholesterol goals reviewed; Counseled on importance of  regular laboratory monitoring as prescribed; Provided HLD educational materials; Reviewed role and benefits of statin for ASCVD risk reduction; Discussed strategies to manage statin-induced myalgias. The patient takes Lipitor 10 mg QD; Reviewed importance of limiting foods high in cholesterol. Education and support provided on a heart healthy/ADA diet. The patient tries to watch what she is eating. ; Screening for signs and symptoms of depression related to chronic disease state;  Assessed social determinant of health barriers;   Symptom Management: Take medications as prescribed   Attend all scheduled provider appointments Call provider office for new concerns or questions  call the Suicide and Crisis Lifeline: 988 call the [place] National Suicide Prevention Lifeline: 475-805-1427 or TTY: 9896038003 TTY (815) 142-3556) to talk to a trained counselor call 1-800-273-TALK (toll free, 24 hour hotline) if experiencing a Mental Health or [place]  - take all medications exactly as prescribed - call doctor with any symptoms you believe are related to your medicine - call doctor when you experience any new  symptoms - go to all doctor appointments as scheduled - adhere to prescribed diet: heart healthy/ADA diet   Follow Up Plan: Telephone follow up appointment with care management team member scheduled for: 06-09-2022 at 1145 am       CCM Expected Outcome:  Monitor, Self-Manage and Reduce Symptoms of: Depression       Current Barriers:  Knowledge Deficits related to how to manage emotions and stress level in a patient with multiple chronic conditions including depression Care Coordination needs related to resources in the community for food insecurities and additional support for social worker support  in a patient with depression and increased stress level Chronic Disease Management support and education needs related to effective management of depression  Lacks caregiver  support.   Planned Interventions: Evaluation of current treatment plan related to depression  and patient's adherence to plan as established by provider. The patient states she has good days and bad days. She states that some days she feels like she can do anything but her health limits her in what she can do. Some days she does not feel like getting up out of the bed and sometimes that is because of the pain she has. She is having a good day today and thankful her feet are healing where she had to have several toes amputated. Reflective listening and support given.  Advised patient to call the office for changes in mood, anxiety, depression, stress level, or mental health needs  Provided education to patient re: doing mindfulness activities, journal writing, creative ways to help her when she is stressed out. The patient is doing much better and her feet are healing. She states that she is having good days and some bad days. Overall she is in good spirits and is thankful for the support of her providers. She sees providers on a regular basis.  Reviewed medications with patient and discussed compliance. States she is compliant with medications Collaborated with LCSW and scheduling care guides regarding the need for follow up by LCSW. The LCSW has worked with the patient before for depression and other needs Provided patient with resources and support for effective management of depression  educational materials related to managing her stress level and depression  Reviewed scheduled/upcoming provider appointments including 04-26-2022 at 10 am Care Guide referral for food insecurities and scheduling follow up with LCSW Social Work referral for support and education needs related to depression and stress. Ongoing support and education from the LCSW.  Discussed plans with patient for ongoing care management follow up and provided patient with direct contact information for care management team Advised patient  to discuss changes in her depression and anxiety level, questions and concerns on how to best manage her chronic care needs due to limited support system with provider Screening for signs and symptoms of depression related to chronic disease state  Assessed social determinant of health barriers  Symptom Management: Take medications as prescribed   Attend all scheduled provider appointments Call provider office for new concerns or questions  call the Suicide and Crisis Lifeline: 988 call the [place] National Suicide Prevention Lifeline: (207) 139-8390 or TTY: (331)439-8986 TTY 6262757578) to talk to a trained counselor call 1-800-273-TALK (toll free, 24 hour hotline) if experiencing a Mental Health or [place]   Follow Up Plan: Telephone follow up appointment with care management team member scheduled for: 06-09-2022 at 1145 am       CCM Expected Outcome:  Monitor, Self-Manage, and Reduce Symptoms of Hypertension  Current Barriers:  Chronic Disease Management support and education needs related to effective management of HTN Lacks caregiver support.   BP Readings from Last 3 Encounters:  03/04/22 (!) 107/57  03/01/22 124/74  02/10/22 105/65    Planned Interventions: Evaluation of current treatment plan related to hypertension self management and patient's adherence to plan as established by provider. The patients blood pressures are stable. She denies any acute findings related to blood pressures. Has follow up with pcp coming up in April. ;   Provided education to patient re: stroke prevention, s/s of heart attack and stroke; Reviewed prescribed diet heart  healthy/ADA diet. The patient is monitoring her dietary intake. Reviewed medications with patient and discussed importance of compliance. The patient states compliance with medications. ;  Discussed plans with patient for ongoing care management follow up and provided patient with direct contact information for  care management team; Advised patient, providing education and rationale, to monitor blood pressure daily and record, calling PCP for findings outside established parameters;  Reviewed scheduled/upcoming provider appointments including: 04-26-2022 at 10 am Advised patient to discuss changes in blood pressures and heart health with provider; Provided education on prescribed diet heart healthy/ADA diet ;  Discussed complications of poorly controlled blood pressure such as heart disease, stroke, circulatory complications, vision complications, kidney impairment, sexual dysfunction;  Screening for signs and symptoms of depression related to chronic disease state;  Assessed social determinant of health barriers;   Symptom Management: Take medications as prescribed   Attend all scheduled provider appointments Call provider office for new concerns or questions  call the Suicide and Crisis Lifeline: 988 call the [place] National Suicide Prevention Lifeline: 551-470-6725 or TTY: (218) 656-2343 TTY 337 339 0529) to talk to a trained counselor call 1-800-273-TALK (toll free, 24 hour hotline) if experiencing a Mental Health or [place]  check blood pressure weekly learn about high blood pressure call doctor for signs and symptoms of high blood pressure develop an action plan for high blood pressure keep all doctor appointments take medications for blood pressure exactly as prescribed report new symptoms to your doctor  Follow Up Plan: Telephone follow up appointment with care management team member scheduled for: 06-09-2022 at 1145 am          Patient verbalizes understanding of instructions and care plan provided today and agrees to view in [place]. Active MyChart status and patient understanding of how to access instructions and care plan via MyChart confirmed with patient.     Telephone follow up appointment with care management team member scheduled for: 06-09-2022 at 1145 am

## 2022-04-14 ENCOUNTER — Ambulatory Visit: Payer: HMO | Admitting: Podiatry

## 2022-04-14 ENCOUNTER — Ambulatory Visit: Payer: PPO | Admitting: Podiatry

## 2022-04-18 ENCOUNTER — Other Ambulatory Visit: Payer: Self-pay | Admitting: Family Medicine

## 2022-04-18 DIAGNOSIS — F5081 Binge eating disorder: Secondary | ICD-10-CM

## 2022-04-18 DIAGNOSIS — L03119 Cellulitis of unspecified part of limb: Secondary | ICD-10-CM

## 2022-04-18 DIAGNOSIS — Z1509 Genetic susceptibility to other malignant neoplasm: Secondary | ICD-10-CM

## 2022-04-18 NOTE — Telephone Encounter (Signed)
Left a detailed voicemail letting the patient know that per provider it is ok for her to start taking showers.

## 2022-04-19 ENCOUNTER — Ambulatory Visit (INDEPENDENT_AMBULATORY_CARE_PROVIDER_SITE_OTHER): Payer: PPO | Admitting: Podiatry

## 2022-04-19 ENCOUNTER — Other Ambulatory Visit: Payer: Self-pay | Admitting: Family Medicine

## 2022-04-19 ENCOUNTER — Encounter: Payer: Self-pay | Admitting: Family Medicine

## 2022-04-19 DIAGNOSIS — Z89412 Acquired absence of left great toe: Secondary | ICD-10-CM

## 2022-04-19 DIAGNOSIS — Z89411 Acquired absence of right great toe: Secondary | ICD-10-CM | POA: Diagnosis not present

## 2022-04-19 DIAGNOSIS — E1142 Type 2 diabetes mellitus with diabetic polyneuropathy: Secondary | ICD-10-CM

## 2022-04-19 DIAGNOSIS — M79674 Pain in right toe(s): Secondary | ICD-10-CM

## 2022-04-19 DIAGNOSIS — B351 Tinea unguium: Secondary | ICD-10-CM

## 2022-04-19 DIAGNOSIS — M79675 Pain in left toe(s): Secondary | ICD-10-CM

## 2022-04-19 DIAGNOSIS — M869 Osteomyelitis, unspecified: Secondary | ICD-10-CM | POA: Diagnosis not present

## 2022-04-19 MED ORDER — CARIPRAZINE HCL 3 MG PO CAPS: 3.0000 mg | ORAL_CAPSULE | Freq: Every day | ORAL | 0 refills | Status: DC

## 2022-04-19 MED ORDER — TIRZEPATIDE 7.5 MG/0.5ML ~~LOC~~ SOAJ: 7.5000 mg | SUBCUTANEOUS | 0 refills | Status: DC

## 2022-04-19 NOTE — Progress Notes (Signed)
  Subjective:  Patient ID: Linda [name], female    DOB: 02/21/1970,  MRN: [id number]  Chief Complaint  Patient presents with   Osteomyelitis of toe of left foot (    DOS: 03/04/2022 Procedure: Right foot second toe amputation at MPJ level and hallux amputation at mid proximal phalanx level.  Left foot hallux amputation at MPJ level and second and third toe amputation at mid proximal phalanx level  52 y.o. female returns for 4th  post-op check.  Patient returns for first postoperative check after multiple toe amputations appraoching 7 week ago.  Patient reports she has been doing well.  She denies nausea vomiting fever chills.  She is now starting to get foot wet and re dress with betaidne and gauze/band aid. No concerns.   Review of Systems: Negative except as noted in the HPI. Denies N/V/F/Ch.   Objective:  There were no vitals filed for this visit. There is no height or weight on file to calculate BMI. Constitutional Well developed. Well nourished.  Vascular Foot warm and well perfused. Capillary refill normal to all digits.  Calf is soft and supple, no posterior calf or knee pain, negative Homans' sign  Neurologic Normal speech. Oriented to person, place, and time. Epicritic sensation to light touch grossly present bilaterally.  Dermatologic On the right foot small 2 mm hypergranular opening at the amputatin site. On the left foot at the hallux amputation site is now healed . The remainder of the amputation sites at toes 2 3 on the left and 1 and 2 on the right are well-healed at this time with some dry eschar but no dehiscence erythema edema or drainage.  Orthopedic: No tenderness to palpation noted about the surgical site.   Multiple view plain film radiographs: X-ray deferred at this visit we will consider again in the future if any issues with wound healing Assessment:   1. Osteomyelitis of toe of left foot ([facility])   2. Osteomyelitis of toe of right foot ([place])   3.  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 DM type 2 with diabetic peripheral neuropathy ([facility])   5. Pain due to onychomycosis of toenails of both feet       Plan:  Patient was evaluated and treated and all questions answered.  S/p foot surgery bilaterally including first and second toe amputation of the right foot and first - third toe amputation on the left foot -Progressing as expected post-operatively.  All amputation sites improved and fully healed with minimal eschar/scar. Upon debridment of eschar on right hallux small hypergranular wound 73mm diameter at central amp site. Treated with silver nitrate. -XR: Deferred at this visit -WB Status: Weightbearing as tolerated in postoperative shoe to bilateral foot -begin daily betaidne and band aid to right hallux until small opening fully healed over with scab -Medications: No further antibiotics indicated at this time -Foot redressed with Betadine S and adhesive bandage dressing to the right hallux  #Onychomycosis with pain  -Nails palliatively debrided as below. -Educated on self-care  Procedure: Nail Debridement Rationale: Pain Type of Debridement: manual, sharp debridement. Instrumentation: Nail nipper, rotary burr. Number of Nails: 5  Return in about 3 months (around 07/20/2022) for RFC.         Everitt Amber, DPM [facility]

## 2022-04-20 ENCOUNTER — Other Ambulatory Visit: Payer: Self-pay

## 2022-04-20 DIAGNOSIS — L03119 Cellulitis of unspecified part of limb: Secondary | ICD-10-CM

## 2022-04-20 MED ORDER — CARIPRAZINE HCL 3 MG PO CAPS: 3.0000 mg | ORAL_CAPSULE | Freq: Every day | ORAL | 0 refills | Status: DC

## 2022-04-21 ENCOUNTER — Inpatient Hospital Stay: Payer: PPO | Attending: Oncology | Admitting: Oncology

## 2022-04-21 ENCOUNTER — Encounter: Payer: Self-pay | Admitting: Oncology

## 2022-04-21 ENCOUNTER — Other Ambulatory Visit: Payer: Self-pay | Admitting: Oncology

## 2022-04-21 VITALS — BP 112/56 | HR 82 | Temp 98.1°F | Resp 20 | Ht 62.0 in | Wt 211.4 lb

## 2022-04-21 DIAGNOSIS — K7581 Nonalcoholic steatohepatitis (NASH): Secondary | ICD-10-CM | POA: Diagnosis not present

## 2022-04-21 DIAGNOSIS — R918 Other nonspecific abnormal finding of lung field: Secondary | ICD-10-CM

## 2022-04-21 DIAGNOSIS — C50311 Malignant neoplasm of lower-inner quadrant of right female breast: Secondary | ICD-10-CM

## 2022-04-21 DIAGNOSIS — K7460 Unspecified cirrhosis of liver: Secondary | ICD-10-CM

## 2022-04-21 DIAGNOSIS — C50911 Malignant neoplasm of unspecified site of right female breast: Secondary | ICD-10-CM | POA: Insufficient documentation

## 2022-04-21 DIAGNOSIS — Z1509 Genetic susceptibility to other malignant neoplasm: Secondary | ICD-10-CM

## 2022-04-21 DIAGNOSIS — Z171 Estrogen receptor negative status [ER-]: Secondary | ICD-10-CM | POA: Diagnosis not present

## 2022-04-21 DIAGNOSIS — D693 Immune thrombocytopenic purpura: Secondary | ICD-10-CM

## 2022-04-21 DIAGNOSIS — Z1501 Genetic susceptibility to malignant neoplasm of breast: Secondary | ICD-10-CM

## 2022-04-21 MED ORDER — SODIUM CHLORIDE 0.9% FLUSH
10.0000 mL | INTRAVENOUS | Status: DC | PRN
  Administered 2022-04-21: 10 mL

## 2022-04-21 MED ORDER — HEPARIN SOD (PORK) LOCK FLUSH 100 UNIT/ML IV SOLN
500.0000 [IU] | Freq: Once | INTRAVENOUS | Status: AC | PRN
  Administered 2022-04-21: 500 [IU]

## 2022-04-21 NOTE — Progress Notes (Signed)
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 She was treated with bilateral mastectomy due to BRCA1 mutation.  She completed 4 cycles of Adriamycin/cyclophosphamide chemotherapy and received 8 out of 12 planned weeks of weekly paclitaxel.  She remains without evidence of recurrence.   BRCA1 positive Positive BRCA 1 mutation, which increases her risk for breast and ovarian cancer, as well as elevates her risk for pancreatic cancer.  She underwent hysterectomy/bilateral salpingo-oophorectomy.  The hepatologist recommended biannual screening for hepatocellular carcinoma with ultrasound alternating with cross-sectional imaging to allow screening for pancreatic cancer.  Right upper quadrant ultrasound in February revealed nodular hepatic contour with coarse hepatic echogenicity consistent with known cirrhosis.  No focal hepatic abnormality was identified. She had s CT abdomen in August 2023, and this is stable.   Liver cirrhosis secondary to NASH (nonalcoholic steatohepatitis) (HCC) Liver cirrhosis as seen on CT imaging in August 2020.  She was referred to [facility], CRNP, at the [facility] in [place].  Hepatitis panel was negative.  She received the appropriate vaccines.  CT imaging in May 2022 was stable.  [name] recommended every 6 month screening for hepatocellular cancer with ultrasound alternating with cross-sectional imaging to allow for screening for pancreatic cancer.  Right upper quadrant ultrasound in February did not reveal any focal hepatic abnormality.  CT imaging  in August was to screen for hepatocellular cancer due to cirrhosis and we see 2 tiny hypodense liver lesions in the right lobe measuring 4 mm each and stable from September 2021.  The pancreas appears normal, we are screening due to her BRCA1 mutation.  I will schedule an ultrasound of the liver to monitor for any liver lesions.   Idiopathic thrombocytopenic purpura (ITP) (HCC) Thrombocytopenia, which is felt to be due to chronic ITP and liver cirrhosis.  She continues to have regular blood work at Dr. [name] office.  CBC in February revealed stable mild thrombocytopenia with a platelet count of 99,000.  Her platelet count today was back down to 74,000 last time but is now improved at 91,000.  The thrombocytopenia could also be due to her liver disease.  Pulmonary nodule CT of the chest was done in November 2023 and showed resolution of the tree-in-bud nodularity in the posterior right lung base.  However she did have a new 6 mm left upper lobe pulmonary nodule.  Multiple toe amputations Her diabetes has not been well-controlled and she admits to an unhealthy diet.  She has now had multiple partial toe amputations in February 2024.  He has been followed by podiatry and the wound center.   Plan: I will schedule her for an ultrasound of the liver to monitor for any liver lesions in view of her liver cirrhosis.  We will flush her port today.  I will see patient in between visits with [name]. We will see her back in 4-6 months with CBC, CMP, and AFP as well as repeat CT chest without contrast prior to her visit to follow-up on her pulmonary nodule.  We will also have a CT of the abdomen to monitor her liver cirrhosis for any lesions.  We will plan to flush her port at that time. I agree that we can alternate ultrasounds with CT scans but I would recommend a CT scan yearly since she does have BRCA1 positivity and is at increased risk for pancreatic cancer.  The patient understands the plans discussed today and  is in agreement with them.  She knows to contact our office if she develops concerns prior to her next appointment.   I provided 30 minutes of face-to-face time during this encounter and > 50% was spent counseling as documented under my assessment and plan.    [name], MD  [facility] [address] [place] [address] Dept: [phone] Dept Fax: [phone]   No orders of the defined types were placed in this encounter.     CHIEF COMPLAINT:  CC: Stage IB triple negative breast cancer  Current Treatment: Observation  HISTORY OF PRESENT ILLNESS:  Linda [name] is a 52 Y.O. female who we began seeing in February 2018 for evaluation of anemia.  Her anemia was felt to be secondary to iron deficiency and we recommended continuation of oral iron supplementation for a total of 6 months, as well as referral back to the gastroenterologist.  The patient also has a history of thrombocytopenia and had previously seen Dr. Melvyn Neth in 2014.  The thrombocytopenia was felt to be secondary to mild chronic immune thrombocytopenic purpura (ITP).  Due to the patient's family history of malignancy, she underwent testing for hereditary cancer syndromes with the [employer] myRisk Hereditary Cancer Panel test.  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  She underwent a robotic hysterectomy and bilateral salpingo-oophorectomy in May 2018 with Dr. Adolphus Birchwood.  Pathology was benign.  We also discussed the option of risk reducing bilateral mastectomy, but she chose to have close surveillance, so we recommended annual MRI breast, in addition to annual mammography.  She was placed on chemoprevention with raloxifene in September 2018.  CT abdomen and pelvis in August 2020 done for bilateral flank pain, revealed a  tiny left renal calculus, as well as probable hepatic cirrhosis without evidence of hepatic mass.  She was then referred to [facility] and is followed by Annamarie Major, CRNP in [place] for her liver cirrhosis.  She tested negative for hepatitis A, B, and C.  She received her hepatitis vaccines in 2020.   We saw her in September 2021 for a new diagnosis of stage IB (T1c N0 M0) triple negative right breast cancer.  She underwent screening bilateral mammogram on August 3rd which revealed possible masses in the right breast.  Diagnostic right mammogram and right breast ultrasound from August 18th confirmed suspicious masses in the right breast at 5 o'clock measuring 1.5 cm and 6 o'clock measuring 1.4 cm.  There was an indeterminate 4 mm with possible distortion in the outer right breast without sonographic correlate.  Right axillary ultrasound was normal.  She then underwent biopsies of both masses and surgical pathology from these procedures revealed  invasive ductal carcinoma, grade 3, with necrosis at 5 o'clock; and invasive ductal carcinoma, grade 1-2, with necrosis and focal myxoid change at 6 o'clock.  No DCIS was identified.  Breast prognostic profiles revealed HER2, and estrogen and progesterone receptors to be negative. Ki67 was 30% at the 5 o'clock mass, and 40%  at 6 o'clock.  She underwent bilateral mastectomies on September 24th with Dr. Georgiana Shore. Surgical pathology from this procedure revealed invasive ductal carcinoma, grade 3, with myxoid change, 19 mm, at 6 o'clock, and invasive ductal carcinoma, grade 3, and ductal carcinoma in situ, 19 mm, at 5 o'clock.  All margins were negative for invasive carcinoma or DCIS.  Two sentinel lymph nodes were negative for metastatic carcinoma (0/2). CT chest, abdomen and pelvis in September did not reveal any evidence of metastatic disease.  She started dose dense AC chemotherapy on October 25th , with plans to follow this with weekly  paclitaxel for 12 doses.  She had significantly worsened anemia with a hemoglobin of 8.7, as well as pancytopenia, so the 4th cycle of chemotherapy was delayed, but she finally got her last dose on January 4th. Her anemia has slowly improved.  She started weekly paclitaxel on January 26th and has received 8 out of 12 planned doses.   She did have some expected pancytopenia.  She had pre-existing neuropathy of the feet and legs.     She was admitted to the hospital for osteomyelitis of the left 2nd toe in March 2022.  She was treated with empiric vancomycin and Zosyn.  Amputation was recommended but the patient refused.  Cultures returned with Klebsiella, sensitive to IV Rocephin, so long term antibiotics 2 g Q24H for continued for the next 6 weeks. At that time, we decided to discontinue weekly paclitaxel, as she had received 8/12 cycles and had significant neuropathy.  She has done fairly well since that time.  She has mild chronic thrombocytopenia felt to be chronic ITP, although, this could be secondary to her liver disease.  In February 2023, a liver ultrasound did not reveal any liver masses.  [name] recommended alternating ultrasound with cross-sectional imaging to allow for pancreatic cancer screening due to her BRCA1 mutation.  INTERVAL HISTORY:  Avril is here today for repeat clinical assessment for Stage IB triple negative breast cancer.  She is also positive for BRCA1 and has a history of liver cirrhosis with cytopenias.  She tells me her brother has been diagnosed with kidney cancer at age 60 and will be having surgery.  Her father had surgery for kidney cancer also and her mother died of breast cancer at age 64.  She complains of stabbing pain of her feet that she rates as a 6 out of 10.  She also complains of fatigue and some degree of anxiety and depression, with some benefit from her medications.  She had to have surgery in February 2024 for partial toe amputation of both feet for 5 toes  total.  CT chest results from 12/10/21 which showed resolution of tree-in-bud nodularity in the posterior right lung base and a 6mm left upper lobe pulmonary nodule, new since previous CTA chest 04/08/2020.Marland Kitchen She is followed by podiatry and the wound center. Her PCP [name] is also following her closely, with labs every 3 months.  She is due for liver imaging and so I will schedule an ultrasound of the abdomen and call her with that result.  She will have her port flush today.  She denies signs of infection such as sore throat, sinus drainage, cough, or urinary symptoms.  She denies fevers or recurrent chills. She denies nausea, vomiting, chest pain, or cough. Her weight has been stable.   REVIEW OF SYSTEMS:  Review of Systems  Constitutional:  Negative for appetite change, chills, fever and unexpected weight change.  HENT:  Negative.  Negative for lump/mass, mouth sores and sore throat.   Eyes: Negative.   Respiratory:  Negative for chest tightness, cough, hemoptysis and wheezing.   Cardiovascular: Negative.  Negative for chest pain, leg swelling and palpitations.  Gastrointestinal: Negative.  Negative for abdominal distention, abdominal pain, blood in stool, constipation, diarrhea, nausea and vomiting.  Endocrine: Negative.   Genitourinary: Negative.  Negative for difficulty urinating, dysuria, frequency and hematuria.   Musculoskeletal:  Negative for back pain, flank pain, gait problem and myalgias.  Skin:  Positive for wound (left 2nd toe).       + bruising   Neurological:  Negative for dizziness, extremity weakness, gait problem, headaches, light-headedness, numbness, seizures and speech difficulty.  Hematological: Negative.  Negative for adenopathy. Does not bruise/bleed easily.  Psychiatric/Behavioral: Negative.  Negative for depression and sleep disturbance. The patient is not nervous/anxious.      VITALS:  Blood pressure (!) 112/56, pulse 82, temperature 98.1 F (36.7 C), temperature  source Oral, resp. rate 20, height 5\' 2"  (1.575 m), weight 211 lb 6.4 oz (95.9 kg), last menstrual period 06/13/2009, SpO2 97 %.  Wt Readings from Last 3 Encounters:  04/21/22 211 lb 6.4 oz (95.9 kg)  03/04/22 207 lb (93.9 kg)  03/01/22 207 lb (93.9 kg)    Body mass index is 38.67 kg/m.  Performance status (ECOG): 1 - Symptomatic but completely ambulatory  PHYSICAL EXAM:  Physical Exam Constitutional:      General: She is not in acute distress.    Appearance: Normal appearance. She is normal weight.  HENT:     Head: Normocephalic.     Comments: Soft cystic-feeling lump at the vertex of her scalp which I feel is benign Eyes:     General: No scleral icterus.    Extraocular Movements: Extraocular movements intact.     Conjunctiva/sclera: Conjunctivae normal.     Pupils: Pupils are equal, round, and reactive to light.  Cardiovascular:     Rate and Rhythm: Normal rate and regular rhythm.     Pulses: Normal pulses.     Heart sounds: Normal heart sounds. No murmur heard.    No friction rub. No gallop.  Pulmonary:     Effort: Pulmonary effort is normal. No respiratory distress.     Breath sounds: Normal breath sounds.  Chest:     Comments: Both mastectomies scars have mild nodularity consistent with scar tissue.   Abdominal:     General: Bowel sounds are normal. There is no distension.     Palpations: Abdomen is soft. There is no hepatomegaly, splenomegaly or mass.     Tenderness: There is no abdominal tenderness.  Musculoskeletal:        General: Normal range of motion.     Cervical back: Normal range of motion and neck supple.     Right lower leg: No edema.     Left lower leg: Edema (mild) present.     Comments: Resolving ecchymosis and resolving abrasion in right upper shoulder.  Lymphadenopathy:     Cervical: No cervical adenopathy.     Right cervical: No superficial, deep or posterior cervical adenopathy.    Left cervical: No superficial, deep or posterior cervical  adenopathy.     Upper Body:     Right upper body: No supraclavicular, axillary or pectoral adenopathy.     Left upper body: No supraclavicular, axillary or pectoral adenopathy.  Skin:    General: Skin is warm and dry.     Findings: Abrasion present.  Comments: Fairly large abrasion of the posterior right elbow area. Does not appear to be infected.  Neurological:     General: No focal deficit present.     Mental Status: She is alert and oriented to person, place, and time. Mental status is at baseline.  Psychiatric:        Mood and Affect: Mood normal.        Behavior: Behavior normal.        Thought Content: Thought content normal.        Judgment: Judgment normal.    LABS:      Latest Ref Rng & Units 03/01/2022   10:38 AM 02/04/2022   12:19 PM 01/06/2022    8:41 AM  CBC  WBC 3.4 - 10.8 x10E3/uL 3.6  4.7  4.5   Hemoglobin 11.1 - 15.9 g/dL 40.9  81.1  91.4   Hematocrit 34.0 - 46.6 % 35.7  36.8  39.2   Platelets 150 - 450 x10E3/uL 79  81  84       Latest Ref Rng & Units 03/01/2022   10:38 AM 01/06/2022    8:41 AM 10/01/2021    8:26 AM  CMP  Glucose 70 - 99 mg/dL 782  956  213   BUN 6 - 24 mg/dL 14  10  11    Creatinine 0.57 - 1.00 mg/dL 0.86  5.78  4.69   Sodium 134 - 144 mmol/L 141  142  141   Potassium 3.5 - 5.2 mmol/L 4.3  4.2  4.3   Chloride 96 - 106 mmol/L 103  101  102   CO2 20 - 29 mmol/L 23  27  21    Calcium 8.7 - 10.2 mg/dL 9.5  9.5  9.5   Total Protein 6.0 - 8.5 g/dL 6.3  6.8  6.5   Total Bilirubin 0.0 - 1.2 mg/dL 0.4  0.4  0.5   Alkaline Phos 44 - 121 IU/L 98  105  120   AST 0 - 40 IU/L 24  29  19    ALT 0 - 32 IU/L 21  25  21       No results found for: "CEA1", "CEA" / No results found for: "CEA1", "CEA" No results found for: "PSA1" No results found for: "GEX528" No results found for: "CAN125"  No results found for: "TOTALPROTELP", "ALBUMINELP", "A1GS", "A2GS", "BETS", "BETA2SER", "GAMS", "MSPIKE", "SPEI" Lab Results  Component Value Date   TIBC 312  05/20/2020   TIBC 300 01/13/2020   FERRITIN 98 05/20/2020   IRONPCTSAT 26 05/20/2020   IRONPCTSAT 24.3 01/13/2020   No results found for: "LDH"  STUDIES:  No results found.    CT Chest w contrast 12/10/2021 Impression: Interval resolution of tree-in-bud nodularity seen in the posterior right lung base on abdomen CT 09/10/21. 6mm left upper lobe pulmonary nodule, new since previous CTA chest 04/08/2020. Features suggest that this may be atelectasis or infection/inflammatory etiology. Given the history of breast cancer, follow-up CT chest wo contrast in 3 months recommended to re-evaluate. Cirrhosis with probable splenomegaly.   CT Abdomen 09/10/2021   HISTORY:   Past Medical History:  Diagnosis Date   Acute postoperative respiratory insufficiency 04/17/2020   AKI (acute kidney injury) (HCC) 04/17/2020   Anemia    Anemia, unspecified    Anemia, unspecified    Anemia, unspecified    Anxiety    Arthritis    BRCA gene mutation positive    Chronic pain syndrome    Complication of anesthesia  Difficulty waking up   Depression    Diabetes mellitus without complication (HCC)    type 2   Diabetic ulcer of toe of left foot associated with type 2 diabetes mellitus, with fat layer exposed (HCC) 03/27/2020   Gastritis    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 No longer has    Past Surgical History:  Procedure Laterality Date   AMPUTATION TOE Bilateral 03/04/2022   Procedure: AMPUTATION TOE RIGHT  FOOT PARTIAL OR TOTAL GREAT TOE AND SECOND TOE, LEFT FOOT PARTIAL OR TOTAL GREAT TOE, SECOND AND THIRD TOE;  Surgeon: Pilar Plate, DPM;  Location: [facility] OR;  Service: Podiatry;  Laterality: Bilateral;   APPENDECTOMY     BACK SURGERY     x 3 lower disc   BILATERAL TOTAL MASTECTOMY WITH AXILLARY LYMPH NODE DISSECTION Bilateral 09/2019   CHOLECYSTECTOMY     nephrolithiasis     ROBOTIC ASSISTED TOTAL HYSTERECTOMY WITH BILATERAL SALPINGO OOPHERECTOMY Bilateral 06/14/2016   Procedure: ROBOTIC ASSISTED TOTAL HYSTERECTOMY WITH BILATERAL SALPINGO OOPHORECTOMY;  Surgeon: Cleda Mccreedy, MD;  Location: [facility] ORS;  Service: Gynecology;  Laterality: Bilateral;    Family History  Problem Relation Age of Onset   Cancer Mother        breast   CAD Father    Diabetes Father    Heart failure Father    Cancer Father        renal carcinoma   Kidney failure Father    Cancer Brother 47       renal carcinoma.   Stroke Paternal Grandmother     Social History:  reports that she has never smoked. She has never used smokeless tobacco. She reports that she does not drink alcohol and does not use drugs.The patient is alone today.  Allergies:  Allergies  Allergen Reactions   Codeine Shortness Of Breath and Other (See Comments)    Other reaction(s): SHOB  ask   Augmentin [Amoxicillin-Pot Clavulanate] Diarrhea   Celecoxib Other (See Comments)    Unknown reaction  Other reaction(s): Unknown  ask   Ezetimibe Other (See Comments)    Other reaction(s): Unknown   Ezetimibe-Simvastatin Other (See Comments)    Unknown reaction  Patient is not aware of an allergy to this medication, ask   Propranolol Other (See Comments)    Other reaction(s): Unknown   Propranolol Hcl Other (See Comments)    Unknown reaction  ask   Simvastatin Other (See Comments)    Other reaction(s): Unknown    Current Medications: Current Outpatient Medications  Medication Sig Dispense Refill   Insulin Pen Needle (BD PEN  NEEDLE NANO U/F) 32G X 4 MM MISC      LORazepam (ATIVAN) 0.5 MG tablet Take by mouth.     LYCOPENE PO Take by mouth.     polyethylene glycol (MIRALAX / GLYCOLAX) 17 g packet Take by mouth.     prochlorperazine (COMPAZINE) 10 MG tablet Take by mouth.  Sod Fluoride-Potassium Nitrate (SODIUM FLUORIDE 5000 SENSITIVE) 1.1-5 % GEL BRUSH ONE bead gently ON teeth FOR TWO MINUTES TWICE DAILY     Specialty Vitamins Products (MG PLUS PROTEIN) 133 MG TABS Take by mouth.     acetaminophen (TYLENOL) 500 MG tablet Take 500 mg by mouth every 6 (six) hours as needed for moderate pain or mild pain. (Patient not taking: Reported on 04/21/2022)     albuterol (VENTOLIN HFA) 108 (90 Base) MCG/ACT inhaler Inhale 2 puffs into the lungs every 6 (six) hours as needed for wheezing or shortness of breath. 8 g 1   atorvastatin (LIPITOR) 10 MG tablet Take 1 tablet (10 mg total) by mouth daily. 90 tablet 0   Bilberry, Vaccinium myrtillus, (BILBERRY PO) Take 450 mg by mouth 2 (two) times daily.     Blood Glucose Monitoring Suppl (ONETOUCH VERIO REFLECT) w/Device KIT AS DIRECTED     Blood Pressure Monitoring (SPHYGMOMANOMETER) MISC 1 each by Does not apply route daily in the afternoon. 1 each 0   buPROPion (WELLBUTRIN XL) 300 MG 24 hr tablet TAKE ONE TABLET BY MOUTH EVERY MORNING 90 tablet 3   busPIRone (BUSPAR) 5 MG tablet Take 1 tablet (5 mg total) by mouth 3 (three) times daily. 90 tablet 3   cariprazine (VRAYLAR) 3 MG capsule Take 1 capsule (3 mg total) by mouth daily. 90 capsule 0   clotrimazole-betamethasone (LOTRISONE) cream Apply twice daily as needed to area of concern 45 g 2   Continuous Blood Gluc Receiver (FREESTYLE LIBRE 2 READER) DEVI E11.69 Check blood sugar 4 times daily as directed 1 each 0   Continuous Blood Gluc Sensor (FREESTYLE LIBRE 2 SENSOR) MISC USE TO check blood glucose AS DIRECTED AND CHANGE sensor every 14 DAYS 6 each 3   cyclobenzaprine (FLEXERIL) 10 MG tablet Take one tablet by mouth every 8 hours  as needed for muscle spasms (Patient taking differently: Take 10 mg by mouth every 8 (eight) hours.) 270 tablet 1   dicyclomine (BENTYL) 20 MG tablet TAKE ONE TABLET BY MOUTH BEFORE MEALS AND AT BEDTIME AS NEEDED FOR STOMACH CRAMPING (Patient not taking: Reported on 04/21/2022) 180 tablet 1   ferrous sulfate 325 (65 FE) MG tablet Take 325 mg by mouth every evening.     FETZIMA 80 MG CP24 TAKE ONE CAPSULE BY MOUTH EVERY EVENING 90 capsule 1   gabapentin (NEURONTIN) 300 MG capsule Take 2 capsules (600 mg total) by mouth 3 (three) times daily. 180 capsule 3   insulin degludec (TRESIBA FLEXTOUCH) 200 UNIT/ML FlexTouch Pen Inject 60 Units into the skin daily. 9 mL 3   Lancets (ONETOUCH DELICA PLUS LANCET30G) MISC USE TO check blood glucose 2-3 times daily AS DIRECTED 100 each 3   levothyroxine (SYNTHROID) 75 MCG tablet TAKE ONE TABLET BY MOUTH BEFORE BREAKFAST 90 tablet 3   losartan (COZAAR) 100 MG tablet Take 1 tablet (100 mg total) by mouth daily. 90 tablet 0   Magnesium 500 MG CAPS Take 500 mg by mouth daily.     morphine (MS CONTIN) 30 MG 12 hr tablet TAKE ONE TABLET BY MOUTH every 12 HOURS 60 tablet 0   Multiple Vitamin (MULTIVITAMIN WITH MINERALS) TABS tablet Take 1 tablet by mouth daily. Solar ray     omega-3 acid ethyl esters (LOVAZA) 1 g capsule TAKE TWO CAPSULES BY MOUTH TWICE DAILY 360 capsule 1   ondansetron (ZOFRAN) 4 MG tablet Take 1 tablet (4 mg total) by mouth every 4 (four) hours as needed for nausea.  90 tablet 3   OVER THE COUNTER MEDICATION Take 1 tablet by mouth in the morning and at bedtime. Lutein for eyes     pantoprazole (PROTONIX) 40 MG tablet Take 1 tablet (40 mg total) by mouth 2 (two) times daily. 180 tablet 1   potassium chloride (MICRO-K) 10 MEQ CR capsule Take 2 capsules (20 mEq total) by mouth 2 (two) times daily. 360 capsule 3   Probiotic Product (PROBIOTIC PO) Take 1 capsule by mouth in the morning. 3.2 billion cfu     rOPINIRole (REQUIP) 0.25 MG tablet TAKE ONE TABLET  BY MOUTH EVERYDAY AT BEDTIME 90 tablet 1   SYNJARDY 12.05-998 MG TABS TAKE ONE TABLET BY MOUTH TWICE DAILY 180 tablet 0   tirzepatide (MOUNJARO) 7.5 MG/0.5ML Pen Inject 7.5 mg into the skin once a week. 6 mL 0   Vitamin D, Ergocalciferol, (DRISDOL) 1.25 MG (50000 UNIT) CAPS capsule Take one capsule by mouth weekly 12 capsule 1   VYVANSE 70 MG capsule TAKE ONE CAPSULE BY MOUTH EVERY MORNING 30 capsule 0   zolpidem (AMBIEN) 10 MG tablet TAKE ONE TABLET BY MOUTH EVERYDAY AT BEDTIME 30 tablet 5   No current facility-administered medications for this visit.   Facility-Administered Medications Ordered in Other Visits  Medication Dose Route Frequency Provider Last Rate Last Admin   sodium chloride flush (NS) 0.9 % injection 10 mL  10 mL Intracatheter PRN Mosher, Harvin Hazel A, PA-C   10 mL at 02/19/21 1004    I,Alexis Herring,acting as a scribe for [name], MD.,have documented all relevant documentation on the behalf of [name], MD,as directed by  [name], MD while in the presence of [name], MD.  I have reviewed this report as typed by the medical scribe, and it is complete and accurate.  [name]   04/21/22 9:47 AM

## 2022-04-24 DIAGNOSIS — E785 Hyperlipidemia, unspecified: Secondary | ICD-10-CM

## 2022-04-24 DIAGNOSIS — I10 Essential (primary) hypertension: Secondary | ICD-10-CM | POA: Diagnosis not present

## 2022-04-24 DIAGNOSIS — F32A Depression, unspecified: Secondary | ICD-10-CM | POA: Diagnosis not present

## 2022-04-24 DIAGNOSIS — E1159 Type 2 diabetes mellitus with other circulatory complications: Secondary | ICD-10-CM | POA: Diagnosis not present

## 2022-04-24 DIAGNOSIS — Z794 Long term (current) use of insulin: Secondary | ICD-10-CM | POA: Diagnosis not present

## 2022-04-25 ENCOUNTER — Other Ambulatory Visit: Payer: Self-pay

## 2022-04-25 DIAGNOSIS — L03119 Cellulitis of unspecified part of limb: Secondary | ICD-10-CM

## 2022-04-25 MED ORDER — CARIPRAZINE HCL 3 MG PO CAPS: 3.0000 mg | ORAL_CAPSULE | Freq: Every day | ORAL | 0 refills | Status: DC

## 2022-04-26 ENCOUNTER — Ambulatory Visit: Payer: Self-pay | Admitting: Licensed Clinical Social Worker

## 2022-04-26 NOTE — Patient Outreach (Signed)
  Care Coordination   04/26/2022 Name: Linda [name] MRN: [id number] DOB: 02/03/1970   Care Coordination Outreach Attempts:  An unsuccessful telephone outreach was attempted today to offer the patient information about available care coordination services as a benefit of their health plan.   Follow Up Plan:  Additional outreach attempts will be made to offer the patient care coordination information and services.   Encounter Outcome:  No Answer   Care Coordination Interventions:  No, not indicated    Norva Riffle.Vyom Brass MSW, [place] [employer] representative [facility] Care Management [phone]

## 2022-04-27 ENCOUNTER — Ambulatory Visit: Payer: HMO | Admitting: Oncology

## 2022-04-27 ENCOUNTER — Other Ambulatory Visit: Payer: HMO

## 2022-05-02 DIAGNOSIS — Z89422 Acquired absence of other left toe(s): Secondary | ICD-10-CM | POA: Diagnosis not present

## 2022-05-02 DIAGNOSIS — I10 Essential (primary) hypertension: Secondary | ICD-10-CM | POA: Diagnosis not present

## 2022-05-02 DIAGNOSIS — Z89421 Acquired absence of other right toe(s): Secondary | ICD-10-CM | POA: Diagnosis not present

## 2022-05-04 ENCOUNTER — Ambulatory Visit: Payer: Self-pay | Admitting: Licensed Clinical Social Worker

## 2022-05-04 NOTE — Patient Instructions (Signed)
Visit Information  Thank you for taking time to visit with me today. Please don't hesitate to contact me if I can be of assistance to you.   Following are the goals we discussed today:   Goals Addressed               This Visit's Progress     Patient Stated she has stress related to managing health issues. She is stressed related to health of her pet (pt-stated)        Interventions  Discussed client needs with [name] Reviewed program support with RN, LCSW, Pharmacist Reviewed ambulation of client. She has some difficulty in walking. She uses walker to help her walk. Reviewed food procurement. Reviewed pain issues of client. She said she has back pain issues. She has difficulty walking longer distance Reviewed support with PCP, [name]. Client has appointment with [name] in May of 2024 Discussed family support. She has reduced family support Reviewed medication procurement. Discussed stress issues. She is concerned over health of her pet. She has had pet for 13 years and is concerned over health of her pet Discussed mood of client. She is stressed related to health issues of her pet.  Discussed transport needs of client.  She did not have any transport issues at present Provided counseling support for client Discussed relaxation techniques: she likes to listen to music, watches TV, enjoys time with her pet dog, enjoys reading Encouraged client to call LCSW for SW support as needed at [phone]      Our next appointment is by telephone on 06/22/22 at 10:30 AM   Please call the care guide team at [phone] if you need to cancel or reschedule your appointment.   If you are experiencing a Mental Health or Behavioral Health Crisis or need someone to talk to, please go to [facility] [address], [place] [phone])   The patient verbalized understanding of instructions, educational materials, and care plan provided  today and DECLINED offer to receive copy of patient instructions, educational materials, and care plan.   The patient has been provided with contact information for the care management team and has been advised to call with any health related questions or concerns.   Kelton Pillar.Jolynne Spurgin MSW, LCSW Licensed Visual merchandiser [facility] Care Management [phone]

## 2022-05-04 NOTE — Patient Outreach (Signed)
  Care Coordination   Follow Up Visit Note   05/04/2022 Name: Linda [name] MRN: [id number] DOB: Dec 03, 1970  Linda [name] is a 52 y.o. year old female who sees [name], Linda Mandes, MD for primary care. I spoke with  Linda [name] by phone today.  What matters to the patients health and wellness today?  She has stress related to managing health issues. She is stressed related to health of her pet dog    Goals Addressed               This Visit's Progress     Patient Stated she has stress related to managing health issues. She is stressed related to health of her pet (pt-stated)        Interventions  Discussed client needs with Linda [name] Reviewed program support with RN, LCSW, Pharmacist Reviewed ambulation of client. She has some difficulty in walking. She uses walker to help her walk. Reviewed food procurement. Reviewed pain issues of client. She said she has back pain issues. She has difficulty walking longer distance Reviewed support with PCP, Linda [name]. Client has appointment with Linda [name] in May of 2024 Discussed family support. She has reduced family support Reviewed medication procurement. Discussed stress issues. She is concerned over health of her pet. She has had pet for 13 years and is concerned over health of her pet Discussed mood of client. She is stressed related to health issues of her pet.  Discussed transport needs of client.  She did not have any transport issues at present Provided counseling support for client Discussed relaxation techniques: she likes to listen to music, watches TV, enjoys time with her pet dog, enjoys reading Encouraged client to call LCSW for SW support as needed at [phone]      SDOH assessments and interventions completed:  Yes  SDOH Interventions Today    Flowsheet Row Most Recent Value  SDOH Interventions   Depression Interventions/Treatment  Counseling  Stress Interventions Provide Counseling   [stress related to managing medical needs]       Care Coordination Interventions:  Yes, provided   Interventions Today    Flowsheet Row Most Recent Value  Chronic Disease   Chronic disease during today's visit Other  [talked with client about client needs]  General Interventions   General Interventions Discussed/Reviewed General Interventions Discussed, [employer] program support. discussed foot care for client]  Exercise Interventions   Exercise Discussed/Reviewed Physical Activity  [discussed ambulation of client]  Physical Activity Discussed/Reviewed Physical Activity Discussed  Education Interventions   Education Provided Provided Education  Provided Verbal Education On [employer]  [discussed family support. discussed pain issues]  Mental Health Interventions   Mental Health Discussed/Reviewed Anxiety, Coping Strategies  [discussed stress related to pet and decreased health of pet]  Nutrition Interventions   Nutrition Discussed/Reviewed Nutrition Discussed  Safety Interventions   Safety Discussed/Reviewed Home Safety        Follow up plan: Follow up call scheduled for 06/22/22 at 10:30 AM    Encounter Outcome:  Pt. Visit Completed   Linda [name].Linda [name] MSW, LCSW Licensed Visual merchandiser [facility] Care Management [phone]

## 2022-05-11 DIAGNOSIS — I959 Hypotension, unspecified: Secondary | ICD-10-CM | POA: Diagnosis not present

## 2022-05-12 ENCOUNTER — Encounter: Payer: Self-pay | Admitting: Hematology and Oncology

## 2022-05-13 ENCOUNTER — Encounter: Payer: Self-pay | Admitting: Family Medicine

## 2022-05-13 ENCOUNTER — Ambulatory Visit (INDEPENDENT_AMBULATORY_CARE_PROVIDER_SITE_OTHER): Payer: PPO | Admitting: Podiatry

## 2022-05-13 DIAGNOSIS — M792 Neuralgia and neuritis, unspecified: Secondary | ICD-10-CM | POA: Diagnosis not present

## 2022-05-13 DIAGNOSIS — E1142 Type 2 diabetes mellitus with diabetic polyneuropathy: Secondary | ICD-10-CM

## 2022-05-13 DIAGNOSIS — Z89411 Acquired absence of right great toe: Secondary | ICD-10-CM | POA: Diagnosis not present

## 2022-05-13 DIAGNOSIS — M2042 Other hammer toe(s) (acquired), left foot: Secondary | ICD-10-CM | POA: Diagnosis not present

## 2022-05-13 DIAGNOSIS — Z89412 Acquired absence of left great toe: Secondary | ICD-10-CM | POA: Diagnosis not present

## 2022-05-13 DIAGNOSIS — M2142 Flat foot [pes planus] (acquired), left foot: Secondary | ICD-10-CM

## 2022-05-13 DIAGNOSIS — M19071 Primary osteoarthritis, right ankle and foot: Secondary | ICD-10-CM

## 2022-05-13 DIAGNOSIS — M2141 Flat foot [pes planus] (acquired), right foot: Secondary | ICD-10-CM | POA: Diagnosis not present

## 2022-05-13 MED ORDER — METHYLPREDNISOLONE 4 MG PO TBPK: ORAL_TABLET | ORAL | 0 refills | Status: DC

## 2022-05-13 NOTE — Progress Notes (Signed)
Subjective:  Patient ID: Linda [name], female    DOB: 1970-09-28,  MRN: [id number]  Chief Complaint  Patient presents with   Foot Pain    Pain has been a problem since amputation on the right foot, burning on the midfoot, patient finds it hard to perform daily activities due to the pain, patient feels the pain is going through the muscle and to the bone     52 y.o. female presents with concern for pain in the right foot especially the outside of the foot.  She says that it is a burning pain as well as a shooting pain.  Patient says that it feels like it is going through the muscle to the bone.  She denies any issue with the amputation sites on either foot says they are healing well no issues.  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 No longer has    Allergies  Allergen Reactions   Codeine Shortness Of Breath and Other (See Comments)     Other reaction(s): SHOB  ask   Augmentin [Amoxicillin-Pot Clavulanate] Diarrhea   Celecoxib Other (See Comments)    Unknown reaction  Other reaction(s): Unknown  ask   Ezetimibe Other (See Comments)    Other reaction(s): Unknown   Ezetimibe-Simvastatin Other (See Comments)    Unknown reaction  Patient is not aware of an allergy to this medication, ask   Propranolol Other (See Comments)    Other reaction(s): Unknown   Propranolol Hcl Other (See Comments)    Unknown reaction  ask   Simvastatin Other (See Comments)    Other reaction(s): Unknown    ROS: Negative except as per HPI above  Objective:  General: AAO x3, NAD  Dermatological: Amputation site of the first and second toe on the right foot and first through third toe on the left foot are doing very well with no open wounds no erythema drainage or edema.  No evidence of infection.  Venous varicosities noted on the right foot.  There is mild edema noted in the right foot  Vascular:  Dorsalis Pedis artery and Posterior Tibial artery pedal pulses are 2/4 bilateral.  Capillary fill time < 3 sec to all digits.   Neruologic: Grossly diminished via light touch protective sensation is absent.  Subjective sensation  of burning swelling pain in the right lateral midfoot.  Musculoskeletal: Attention to the right foot there is been partial amputation of the right hallux as well as amputation of the MPJ level of the second toe.  On the left foot there is amputation at the MPJ level of the left hallux and partial amputation of the second and third toe.  Hammertoe present of the left foot toe.  Pes planus foot deformity  Gait: Unassisted, Nonantalgic.   No images are attached to the encounter.  Radiographs:  Deferred Assessment:   1. Arthritis of right midfoot   2. Neuropathic pain   3. Hammertoe of left foot   4. 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 DM type 2 with diabetic peripheral neuropathy      Plan:   Patient was evaluated and treated and all questions answered.  # Right midfoot arthritis versus neuropathic pain with diabetes with peripheral neuropathy -Discussed with patient she is either experiencing some neuropathic pain which I believe is more likely given her history of diabetes versus midfoot arthritic changes and pain related to gait changes related to the amputations on the right foot -Recommend trying a methylprednisolone 4 mg steroid taper pack take as directed for 6 days. -I also recommend the patient discuss use of higher dose gabapentin with her primary care doctor.  She may need to be increased from 600 to 900 mg 3 times daily. -Continue wearing good supportive shoes -Also recommend diabetic liners and diabetic shoes for the patient as she does have a history of amputations as well as peripheral neuropathy and hammertoes  Return in about 6 weeks (around 06/24/2022) for f/u pain in right midfoot.          Corinna Gab, DPM [facility] / [facility]

## 2022-05-16 ENCOUNTER — Other Ambulatory Visit: Payer: Self-pay | Admitting: Family Medicine

## 2022-05-17 ENCOUNTER — Other Ambulatory Visit: Payer: Self-pay | Admitting: Family Medicine

## 2022-05-17 ENCOUNTER — Encounter: Payer: Self-pay | Admitting: Family Medicine

## 2022-05-17 DIAGNOSIS — F50819 Binge eating disorder, unspecified: Secondary | ICD-10-CM

## 2022-05-17 DIAGNOSIS — F5081 Binge eating disorder: Secondary | ICD-10-CM

## 2022-05-17 DIAGNOSIS — Z1501 Genetic susceptibility to malignant neoplasm of breast: Secondary | ICD-10-CM

## 2022-05-17 DIAGNOSIS — M62838 Other muscle spasm: Secondary | ICD-10-CM

## 2022-05-17 MED ORDER — GABAPENTIN 800 MG PO TABS: 800.0000 mg | ORAL_TABLET | Freq: Three times a day (TID) | ORAL | 5 refills | Status: DC

## 2022-05-23 ENCOUNTER — Telehealth: Payer: Self-pay | Admitting: Oncology

## 2022-05-23 NOTE — Telephone Encounter (Signed)
CT Chest / Abdomen w/ Contrast  have been scheduled for 09/19/22;Checking in @ 9:45 am   LVM requesting pt call the office to provide appt information and instructions.

## 2022-05-24 DIAGNOSIS — I10 Essential (primary) hypertension: Secondary | ICD-10-CM | POA: Diagnosis not present

## 2022-05-24 DIAGNOSIS — R059 Cough, unspecified: Secondary | ICD-10-CM | POA: Diagnosis not present

## 2022-05-24 NOTE — Assessment & Plan Note (Signed)
Platelets have been stable at 80.  Mild increased risk of bleeding problems but overall should be fine. 

## 2022-05-24 NOTE — Progress Notes (Signed)
Subjective:  Patient ID: Linda [name], female    DOB: Apr 19, 1970  Age: 52 y.o. MRN: [id number]  Chief Complaint  Patient presents with   Medical Management of Chronic Issues    HPI Diabetes:  Complications: HYPERTENSION, CKD Glucose checking: CGM Glucose logs: 100-200 Hypoglycemia: no  Most recent A1C: 7.4 Current medications: Mounjaro 7.5mg  once weekly, Synjardy 12.5 mg/ 1000 mg 1 tablet twice daily, Tresiba inject 60 units daily. Last Eye Exam: scheduled for August 2024.  Foot checks: Daily  Hyperlipidemia: Current medications: Taking Atorvastatin 10 mg 1 tablet daily, lovaza 1 gm 2 capsules twice daily.   Hypertensive kidney disease with Stage 3B chronic kidney disease: Complications: DM Current medications: Losartan potassium 50 mg take 1 tablet daily, Potassium Chloride 10 meq take 2 capsules twice daily.  Acquired Hypothyroidism: Patient is currently taking Levothyroxine 75 mg daily.  Chronic Pain Syndrome: Chronic back pain has worsened. On Morphine 30 mg take 1 tablet twice daily, Gabapentin 300 mg take 2 capsules three times a day.   Binge eating disorder: Patient is currently taking Vyvanse 70 mg daily.  Vitamin D deficiency: Patient is currently taking Vitamin D 50,000 units 1 capsule weekly.  GERD: [facility] did not send Pantoprazole 40 mg 1 tablet TWICE DAILY in pill pack, Famotidine 20 mg 1 tablet twice daily .  Depression, mild, recurrent: Patient is currently taking Fetzima 80 mg daily, Bupropion 300 mg 1 tablet daily, Buspirone 5 mg take 1 tablet three times a day. She has been out of her vraylar and she has been having hallucinations again. 5  IBS D: has dicyclomine that uses as needed.       05/25/2022    7:45 AM 05/04/2022    8:53 AM 04/04/2022   11:18 AM 03/21/2022    3:12 PM 02/21/2022    3:16 PM  Depression screen PHQ 2/9  Decreased Interest 3 1 1 1 1   Down, Depressed, Hopeless 3 1 1 1 1   PHQ - 2 Score 6 2 2 2 2   Altered sleeping 3 1 1 1  1   Tired, decreased energy 3 1 2 2 2   Change in appetite 1 1 1 1 2   Feeling bad or failure about yourself  3 1 1 1 1   Trouble concentrating 3 1 1 1 1   Moving slowly or fidgety/restless 3 1 1 1 1   Suicidal thoughts 0 0 0 0 0  PHQ-9 Score 22 8 9 9 10   Difficult doing work/chores Very difficult Somewhat difficult Somewhat difficult Somewhat difficult Somewhat difficult        05/04/2022    8:49 AM  Fall Risk   Risk for fall due to : Impaired balance/gait    Patient Care Team: [name], MD as PCP - General (Internal Medicine) [name], MD as Consulting Physician (Oncology) Zettie Pho, [facility] (Pharmacist) Marlowe Sax, RN as Case Manager (General Practice)   Review of Systems  Constitutional:  Positive for fatigue. Negative for chills and fever.  HENT:  Positive for congestion. Negative for ear pain and sore throat.   Respiratory:  Positive for cough and shortness of breath.   Cardiovascular:  Negative for chest pain.  Gastrointestinal:  Negative for abdominal pain, constipation, diarrhea, nausea and vomiting.  Genitourinary:  Negative for dysuria, urgency and vaginal pain.  Musculoskeletal:  Positive for back pain. Negative for arthralgias (right knee and right ankle) and myalgias.  Skin:  Negative for rash.  Neurological:  Positive for light-headedness and headaches. Negative  for dizziness.  Psychiatric/Behavioral:  Positive for dysphoric mood. The patient is nervous/anxious.     Current Outpatient Medications on File Prior to Visit  Medication Sig Dispense Refill   acetaminophen (TYLENOL) 500 MG tablet Take 500 mg by mouth every 6 (six) hours as needed for moderate pain or mild pain.     atorvastatin (LIPITOR) 10 MG tablet TAKE ONE TABLET BY MOUTH EVERY EVENING 90 tablet 1   Bilberry, Vaccinium myrtillus, (BILBERRY PO) Take 450 mg by mouth 2 (two) times daily.     Blood Glucose Monitoring Suppl (ONETOUCH VERIO REFLECT) w/Device KIT AS DIRECTED     Blood  Pressure Monitoring (SPHYGMOMANOMETER) MISC 1 each by Does not apply route daily in the afternoon. 1 each 0   buPROPion (WELLBUTRIN XL) 300 MG 24 hr tablet TAKE ONE TABLET BY MOUTH EVERY MORNING 90 tablet 3   busPIRone (BUSPAR) 5 MG tablet TAKE ONE TABLET BY MOUTH THREE TIMES DAILY 90 tablet 1   clotrimazole-betamethasone (LOTRISONE) cream Apply twice daily as needed to area of concern 45 g 2   Continuous Blood Gluc Receiver (FREESTYLE LIBRE 2 READER) DEVI E11.69 Check blood sugar 4 times daily as directed 1 each 0   Continuous Blood Gluc Sensor (FREESTYLE LIBRE 2 SENSOR) MISC USE TO check blood glucose AS DIRECTED AND CHANGE sensor every 14 DAYS 6 each 3   cyclobenzaprine (FLEXERIL) 10 MG tablet TAKE ONE TABLET BY MOUTH every EIGHT hours AS NEEDED FOR MUSCLE SPASMS 270 tablet 1   dicyclomine (BENTYL) 20 MG tablet TAKE ONE TABLET BY MOUTH BEFORE MEALS AND AT BEDTIME AS NEEDED FOR STOMACH CRAMPING 180 tablet 1   ferrous sulfate 325 (65 FE) MG tablet Take 325 mg by mouth every evening.     FETZIMA 80 MG CP24 TAKE ONE CAPSULE BY MOUTH EVERY EVENING 90 capsule 1   gabapentin (NEURONTIN) 800 MG tablet Take 1 tablet (800 mg total) by mouth 3 (three) times daily. 90 tablet 5   insulin degludec (TRESIBA FLEXTOUCH) 200 UNIT/ML FlexTouch Pen Inject 60 Units into the skin daily. 9 mL 3   Insulin Pen Needle (BD PEN NEEDLE NANO U/F) 32G X 4 MM MISC      Lancets (ONETOUCH DELICA PLUS LANCET30G) MISC USE TO check blood glucose 2-3 times daily AS DIRECTED 100 each 3   levothyroxine (SYNTHROID) 75 MCG tablet TAKE ONE TABLET BY MOUTH BEFORE BREAKFAST 90 tablet 3   LYCOPENE PO Take by mouth.     Magnesium 500 MG CAPS Take 500 mg by mouth daily.     morphine (MS CONTIN) 30 MG 12 hr tablet TAKE ONE TABLET BY MOUTH every 12 hours 60 tablet 0   Multiple Vitamin (MULTIVITAMIN WITH MINERALS) TABS tablet Take 1 tablet by mouth daily. Solar ray     omega-3 acid ethyl esters (LOVAZA) 1 g capsule TAKE TWO CAPSULES BY MOUTH  TWICE DAILY 360 capsule 1   ondansetron (ZOFRAN) 4 MG tablet Take 1 tablet (4 mg total) by mouth every 4 (four) hours as needed for nausea. 90 tablet 3   OVER THE COUNTER MEDICATION Take 1 tablet by mouth in the morning and at bedtime. Lutein for eyes     pantoprazole (PROTONIX) 40 MG tablet Take 1 tablet (40 mg total) by mouth 2 (two) times daily. 180 tablet 1   polyethylene glycol (MIRALAX / GLYCOLAX) 17 g packet Take by mouth.     potassium chloride (MICRO-K) 10 MEQ CR capsule Take 2 capsules (20 mEq total) by mouth 2 (  two) times daily. 360 capsule 3   Probiotic Product (PROBIOTIC PO) Take 1 capsule by mouth in the morning. 3.2 billion cfu     prochlorperazine (COMPAZINE) 5 MG tablet TAKE ONE TABLET BY MOUTH every SIX hours AS NEEDED FOR NAUSEA AND VOMITING 30 tablet 1   rOPINIRole (REQUIP) 0.25 MG tablet TAKE ONE TABLET BY MOUTH EVERYDAY AT BEDTIME 90 tablet 1   Sod Fluoride-Potassium Nitrate (SODIUM FLUORIDE 5000 SENSITIVE) 1.1-5 % GEL BRUSH ONE bead gently ON teeth FOR TWO MINUTES TWICE DAILY     SYNJARDY 12.05-998 MG TABS TAKE ONE TABLET BY MOUTH TWICE DAILY 180 tablet 1   tirzepatide (MOUNJARO) 7.5 MG/0.5ML Pen Inject 7.5 mg into the skin once a week. 6 mL 0   Vitamin D, Ergocalciferol, (DRISDOL) 1.25 MG (50000 UNIT) CAPS capsule Take one capsule by mouth weekly 12 capsule 1   VYVANSE 70 MG capsule TAKE ONE CAPSULE BY MOUTH EVERY MORNING 30 capsule 0   zolpidem (AMBIEN) 10 MG tablet TAKE ONE TABLET BY MOUTH EVERYDAY AT BEDTIME 30 tablet 5   Current Facility-Administered Medications on File Prior to Visit  Medication Dose Route Frequency Provider Last Rate Last Admin   sodium chloride flush (NS) 0.9 % injection 10 mL  10 mL Intracatheter PRN Mosher, Kelli A, PA-C   10 mL at 02/19/21 1004   Past Medical History:  Diagnosis Date   Acute postoperative respiratory insufficiency 04/17/2020   AKI (acute kidney injury) (HCC) 04/17/2020   Anemia    Anemia, unspecified    Anemia, unspecified     Anemia, unspecified    Anxiety    Arthritis    BRCA gene mutation positive    Chronic pain syndrome    Complication of anesthesia    Difficulty waking up   Depression    Diabetes mellitus without complication (HCC)    type 2   Diabetic ulcer of toe of left foot associated with type 2 diabetes mellitus, with fat layer exposed (HCC) 03/27/2020   Gastritis    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 No longer has   Past Surgical History:  Procedure Laterality Date   AMPUTATION TOE Bilateral 03/04/2022   Procedure: AMPUTATION TOE RIGHT FOOT PARTIAL OR TOTAL GREAT TOE AND SECOND TOE, LEFT FOOT PARTIAL OR TOTAL GREAT TOE, SECOND AND THIRD TOE;  Surgeon: Pilar Plate, DPM;  Location: [facility] OR;  Service: Podiatry;  Laterality: Bilateral;   APPENDECTOMY     BACK SURGERY     x 3 lower disc   BILATERAL TOTAL MASTECTOMY WITH AXILLARY LYMPH NODE DISSECTION Bilateral 09/2019   CHOLECYSTECTOMY     nephrolithiasis     ROBOTIC ASSISTED TOTAL HYSTERECTOMY WITH BILATERAL SALPINGO OOPHERECTOMY Bilateral 06/14/2016   Procedure: ROBOTIC ASSISTED TOTAL HYSTERECTOMY WITH BILATERAL SALPINGO OOPHORECTOMY;  Surgeon: Cleda Mccreedy, MD;  Location: [facility];  Service: Gynecology;  Laterality: Bilateral;    Family History  Problem Relation Age of Onset   Cancer Mother  breast   CAD Father    Diabetes Father    Heart failure Father    Cancer Father        renal carcinoma   Kidney failure Father    Cancer Brother  75       renal carcinoma.   Stroke Paternal Grandmother    Social History   Socioeconomic History   Marital status: Divorced    Spouse name: Not on file   Number of children: Not on file   Years of education: Not on file   Highest education level: Not on file  Occupational History   Occupation: disabled  Tobacco Use   Smoking status: Never   Smokeless tobacco: Never  Substance and Sexual Activity   Alcohol use: No   Drug use: No   Sexual activity: Yes  Other Topics Concern   Not on file  Social History Narrative   wears sunscreen, brushes and flosses daily, see's dentist bi-annually, has smoke/carbon monoxide detectors, wears a seatbelt and practices gun safety   Social Determinants of Health   Financial Resource Strain: Medium Risk (02/02/2022)   Overall Financial Resource Strain (CARDIA)    Difficulty of Paying Living Expenses: Somewhat hard  Food Insecurity: Food Insecurity Present (02/07/2022)   Hunger Vital Sign    Worried About Running Out of Food in the Last Year: Sometimes true    Ran Out of Food in the Last Year: Sometimes true  Transportation Needs: No Transportation Needs (02/07/2022)   PRAPARE - Administrator, Civil Service (Medical): No    Lack of Transportation (Non-Medical): No  Recent Concern: Transportation Needs - Unmet Transportation Needs (01/19/2022)   PRAPARE - Transportation    Lack of Transportation (Medical): Yes    Lack of Transportation (Non-Medical): Yes  Physical Activity: Inactive (04/04/2022)   Exercise Vital Sign    Days of Exercise per Week: 0 days    Minutes of Exercise per Session: 0 min  Stress: Stress Concern Present (05/04/2022)   [employer] of Occupational Health - Occupational Stress Questionnaire    Feeling of Stress : To some extent  Social Connections: Moderately Isolated (02/02/2022)   Social Connection and Isolation Panel [NHANES]    Frequency of Communication with Friends and Family: More than three times a  week    Frequency of Social Gatherings with Friends and Family: Once a week    Attends Religious Services: 1 to 4 times per year    Active Member of [employer] or Organizations: No    Attends Engineer, structural: Never    Marital Status: Divorced    Objective:  BP (!) 110/56   Pulse 100   Temp (!) 97.2 F (36.2 C)   Resp 18   Ht 5\' 2"  (1.575 m)   Wt 206 lb (93.4 kg)   LMP 06/13/2009   BMI 37.68 kg/m      05/25/2022    7:34 AM 04/21/2022    9:31 AM 03/04/2022    9:45 AM  BP/Weight  Systolic BP 110 112 107  Diastolic BP 56 56 57  Wt. (Lbs) 206 211.4   BMI 37.68 kg/m2 38.67 kg/m2     Physical Exam Vitals reviewed.  Constitutional:      Appearance: Normal appearance. She is obese.  HENT:     Right Ear: Tympanic membrane normal.     Left Ear: Tympanic membrane normal.     Nose: Mucosal edema and congestion present.     Right Turbinates: Enlarged and  swollen.     Left Turbinates: Enlarged and swollen.     Right Sinus: Maxillary sinus tenderness present.     Left Sinus: Maxillary sinus tenderness present.  Neck:     Vascular: No carotid bruit.  Cardiovascular:     Rate and Rhythm: Normal rate and regular rhythm.     Heart sounds: Normal heart sounds.  Pulmonary:     Effort: Pulmonary effort is normal. No respiratory distress.     Breath sounds: Examination of the right-upper field reveals decreased breath sounds and wheezing. Examination of the left-upper field reveals decreased breath sounds and wheezing. Examination of the right-middle field reveals decreased breath sounds and rhonchi. Examination of the left-middle field reveals decreased breath sounds. Examination of the right-lower field reveals decreased breath sounds and rhonchi. Examination of the left-lower field reveals decreased breath sounds. Decreased breath sounds, wheezing (left upper lob) and rhonchi (right lower lobe) present.  Abdominal:     General: Abdomen is flat. Bowel sounds are normal.      Palpations: Abdomen is soft.     Tenderness: There is no abdominal tenderness.  Neurological:     Mental Status: She is alert and oriented to person, place, and time.  Psychiatric:        Mood and Affect: Mood normal.        Behavior: Behavior normal.     Diabetic Foot Exam - Simple   No data filed      Lab Results  Component Value Date   WBC 6.3 05/25/2022   HGB 11.6 05/25/2022   HCT 37.9 05/25/2022   PLT 107 (L) 05/25/2022   GLUCOSE 97 05/25/2022   CHOL 148 05/25/2022   TRIG 155 (H) 05/25/2022   HDL 37 (L) 05/25/2022   LDLCALC 84 05/25/2022   ALT 25 05/25/2022   AST 21 05/25/2022   NA 142 05/25/2022   K 4.5 05/25/2022   CL 104 05/25/2022   CREATININE 1.18 (H) 05/25/2022   BUN 19 05/25/2022   CO2 21 05/25/2022   TSH 3.150 05/25/2022   INR 1.0 03/01/2022   HGBA1C 6.9 (H) 05/25/2022   MICROALBUR 30 10/09/2019      Assessment & Plan:    Acquired hypothyroidism Assessment & Plan: Previously well controlled Continue Synthroid at current dose  Recheck TSH and adjust Synthroid as indicated    Orders: -     TSH  Back pain of lumbar region with sciatica  Binge eating disorder Assessment & Plan: The current medical regimen is effective;  continue present plan and medications. Continue Vyvanse to 70 mg daily.     Chronic pain syndrome Assessment & Plan: Secondary to back pain.  UDS. The current medical regimen is effective;  continue present plan and medications.    Orders: -     Pain Mgt Scrn (14 Drugs), Ur  Class 2 severe obesity due to excess calories with serious comorbidity and body mass index (BMI) of 38.0 to 38.9 in adult [facility]) Assessment & Plan: Recommend continue to work on eating healthy diet and exercise.  Comorbidities: diabetes, hyperlipidemia   Moderately severe recurrent major depression (HCC) Assessment & Plan: The current medical regimen is effective;  continue present plan and medications. Continue Fetzima 80 mg daily, Bupropion  300 mg 1 tablet daily, Buspirone 5 mg take 1 tablet three times a day. Restart vraylar 1.5 mg daily x 1 day, then increase to 2 daily.  Recommend call Dr. Luther Hearing to set up counseling.  Orders: -  Cariprazine HCl; Take 1 capsule (3 mg total) by mouth daily.  Dispense: 90 capsule; Refill: 0  Diabetic glomerulopathy (HCC) Assessment & Plan: Control: not at goal. Recommend continue CGM. Recommend check feet daily. Recommend annual eye exams. Medicines: Recommend increase Trulicity 4.5 mg injection once weekly, Synjardy 12.5 mg/ 1000 mg 1 tablet twice daily, Tresiba inject 52 units daily.Continue to work on eating a healthy diet and exercise.  Labs drawn today.   Orders: -     CBC with Differential/Platelet -     Hemoglobin A1c -     Microalbumin / creatinine urine ratio  Idiopathic thrombocytopenic purpura (ITP) (HCC) Assessment & Plan: Platelets have been stable at 80.  Mild increased risk of bleeding problems but overall should be fine.   Liver cirrhosis secondary to NASH (nonalcoholic steatohepatitis) (HCC) Assessment & Plan: This also contributes to thrombocytopenia. Check cmp  Orders: -     Comprehensive metabolic panel  Dyslipidemia associated with type 2 diabetes mellitus (HCC) Assessment & Plan: Diabetes Control: poorly controlled. Cholesterol well controlled.  Recommend check sugars fasting daily. Recommend check feet daily. Recommend annual eye exams. Medicines: Trulicity 3 mg injection once weekly, Synjardy 12.5 mg/ 1000 mg 1 tablet twice daily. Increase Tresiba to 60 units daily. Continue to work on eating a healthy diet and exercise.  Labs drawn today.     Orders: -     Lipid panel -     Cardiovascular Risk Assessment  Acute bronchitis and bronchiolitis Assessment & Plan: Doxycycline 100 mg twice daily x 10 days. Albuterol inhaler 2 puffs 4 times daily over the next 48 hours Go down to 4 times daily as needed shortness of  breath/wheezing.   Orders: -     Doxycycline Hyclate; Take 1 tablet (100 mg total) by mouth 2 (two) times daily.  Dispense: 20 tablet; Refill: 0 -     Albuterol Sulfate HFA; Inhale 2 puffs into the lungs every 6 (six) hours as needed for wheezing or shortness of breath.  Dispense: 8 g; Refill: 1 -     DG Chest 2 View; Future  Acute non-recurrent maxillary sinusitis Assessment & Plan: Doxycycline  Orders: -     Doxycycline Hyclate; Take 1 tablet (100 mg total) by mouth 2 (two) times daily.  Dispense: 20 tablet; Refill: 0  Adjustment insomnia Assessment & Plan: Start on rozerem 8 mg once daily at night.  Discontinue ambien.   Orders: -     Ramelteon; Take 1 tablet (8 mg total) by mouth at bedtime.  Dispense: 90 tablet; Refill: 1  Gross hematuria Assessment & Plan: Prescription: doxy Sent for urine culture.  Orders: -     Doxycycline Hyclate; Take 1 tablet (100 mg total) by mouth 2 (two) times daily.  Dispense: 20 tablet; Refill: 0 -     POCT URINALYSIS DIP (CLINITEK) -     Urine Culture  Dizziness Assessment & Plan: Decrease losartan 50 mg daily due to low bp.    Other orders -     Losartan Potassium; Take 1 tablet (50 mg total) by mouth daily.  Dispense: 90 tablet; Refill: 0     Meds ordered this encounter  Medications   doxycycline (VIBRA-TABS) 100 MG tablet    Sig: Take 1 tablet (100 mg total) by mouth 2 (two) times daily.    Dispense:  20 tablet    Refill:  0   albuterol (VENTOLIN HFA) 108 (90 Base) MCG/ACT inhaler    Sig: Inhale 2 puffs into the lungs  every 6 (six) hours as needed for wheezing or shortness of breath.    Dispense:  8 g    Refill:  1   losartan (COZAAR) 50 MG tablet    Sig: Take 1 tablet (50 mg total) by mouth daily.    Dispense:  90 tablet    Refill:  0   ramelteon (ROZEREM) 8 MG tablet    Sig: Take 1 tablet (8 mg total) by mouth at bedtime.    Dispense:  90 tablet    Refill:  1   cariprazine (VRAYLAR) 3 MG capsule    Sig: Take 1  capsule (3 mg total) by mouth daily.    Dispense:  90 capsule    Refill:  0    Orders Placed This Encounter  Procedures   Urine Culture   DG Chest 2 View   CBC with Differential/Platelet   Comprehensive metabolic panel   Hemoglobin A1c   Lipid panel   TSH   Pain Mgt Scrn (14 Drugs), Ur   Microalbumin / creatinine urine ratio   Cardiovascular Risk Assessment   POCT URINALYSIS DIP (CLINITEK)     Follow-up: Return in about 3 months (around 08/25/2022) for chronic fasting.  Marland Kitchen   An After Visit Summary was printed and given to the patient.  I attest that I have reviewed this visit and agree with the plan scribed by my staff.   [name], MD [facility] [phone]

## 2022-05-24 NOTE — Assessment & Plan Note (Signed)
>>  ASSESSMENT AND PLAN FOR MODERATELY SEVERE RECURRENT MAJOR DEPRESSION (HCC) WRITTEN ON 05/29/2022  5:48 PM BY [name], KIRSTEN, MD  The current medical regimen is effective;  continue present plan and medications. Continue Fetzima 80 mg daily, Bupropion 300 mg 1 tablet daily, Buspirone 5 mg take 1 tablet three times a day. Restart vraylar 1.5 mg daily x 1 day, then increase to 2 daily.  Recommend call Dr. Luther Hearing to set up counseling.

## 2022-05-24 NOTE — Assessment & Plan Note (Addendum)
Diabetes Control: poorly controlled. Cholesterol well controlled.  Recommend check sugars fasting daily. Recommend check feet daily. Recommend annual eye exams. Medicines: Trulicity 3 mg injection once weekly, Synjardy 12.5 mg/ 1000 mg 1 tablet twice daily. Increase Tresiba to 60 units daily. Continue to work on eating a healthy diet and exercise.  Labs drawn today.

## 2022-05-24 NOTE — Assessment & Plan Note (Signed)
Recommend continue to work on eating healthy diet and exercise.  °Comorbidities: diabetes, hyperlipidemia °

## 2022-05-24 NOTE — Assessment & Plan Note (Addendum)
The current medical regimen is effective;  continue present plan and medications. Continue Fetzima 80 mg daily, Bupropion 300 mg 1 tablet daily, Buspirone 5 mg take 1 tablet three times a day. Restart vraylar 1.5 mg daily x 1 day, then increase to 2 daily.  Recommend call Dr. Luther Hearing to set up counseling.

## 2022-05-24 NOTE — Assessment & Plan Note (Signed)
The current medical regimen is effective;  continue present plan and medications. Continue Vyvanse to 70 mg daily.

## 2022-05-24 NOTE — Assessment & Plan Note (Addendum)
Secondary to back pain.  UDS. The current medical regimen is effective;  continue present plan and medications.

## 2022-05-24 NOTE — Assessment & Plan Note (Signed)
Previously well controlled Continue Synthroid at current dose  Recheck TSH and adjust Synthroid as indicated   

## 2022-05-24 NOTE — Assessment & Plan Note (Signed)
Control: not at goal. Recommend continue CGM. Recommend check feet daily. Recommend annual eye exams. Medicines: Recommend increase Trulicity 4.5 mg injection once weekly, Synjardy 12.5 mg/ 1000 mg 1 tablet twice daily, Tresiba inject 52 units daily.Continue to work on eating a healthy diet and exercise.  Labs drawn today.  

## 2022-05-24 NOTE — Assessment & Plan Note (Signed)
This also contributes to thrombocytopenia. Check cmp

## 2022-05-25 ENCOUNTER — Ambulatory Visit (INDEPENDENT_AMBULATORY_CARE_PROVIDER_SITE_OTHER): Payer: PPO | Admitting: Family Medicine

## 2022-05-25 VITALS — BP 110/56 | HR 100 | Temp 97.2°F | Resp 18 | Ht 62.0 in | Wt 206.0 lb

## 2022-05-25 DIAGNOSIS — E039 Hypothyroidism, unspecified: Secondary | ICD-10-CM

## 2022-05-25 DIAGNOSIS — E1121 Type 2 diabetes mellitus with diabetic nephropathy: Secondary | ICD-10-CM

## 2022-05-25 DIAGNOSIS — J209 Acute bronchitis, unspecified: Secondary | ICD-10-CM | POA: Diagnosis not present

## 2022-05-25 DIAGNOSIS — E785 Hyperlipidemia, unspecified: Secondary | ICD-10-CM

## 2022-05-25 DIAGNOSIS — F5081 Binge eating disorder: Secondary | ICD-10-CM

## 2022-05-25 DIAGNOSIS — M5440 Lumbago with sciatica, unspecified side: Secondary | ICD-10-CM

## 2022-05-25 DIAGNOSIS — G894 Chronic pain syndrome: Secondary | ICD-10-CM

## 2022-05-25 DIAGNOSIS — D693 Immune thrombocytopenic purpura: Secondary | ICD-10-CM

## 2022-05-25 DIAGNOSIS — J0100 Acute maxillary sinusitis, unspecified: Secondary | ICD-10-CM

## 2022-05-25 DIAGNOSIS — F332 Major depressive disorder, recurrent severe without psychotic features: Secondary | ICD-10-CM | POA: Diagnosis not present

## 2022-05-25 DIAGNOSIS — K7460 Unspecified cirrhosis of liver: Secondary | ICD-10-CM

## 2022-05-25 DIAGNOSIS — K7581 Nonalcoholic steatohepatitis (NASH): Secondary | ICD-10-CM

## 2022-05-25 DIAGNOSIS — E1169 Type 2 diabetes mellitus with other specified complication: Secondary | ICD-10-CM | POA: Diagnosis not present

## 2022-05-25 DIAGNOSIS — R310 Gross hematuria: Secondary | ICD-10-CM | POA: Diagnosis not present

## 2022-05-25 DIAGNOSIS — F5102 Adjustment insomnia: Secondary | ICD-10-CM

## 2022-05-25 DIAGNOSIS — Z6838 Body mass index (BMI) 38.0-38.9, adult: Secondary | ICD-10-CM

## 2022-05-25 DIAGNOSIS — J0101 Acute recurrent maxillary sinusitis: Secondary | ICD-10-CM

## 2022-05-25 DIAGNOSIS — R42 Dizziness and giddiness: Secondary | ICD-10-CM

## 2022-05-25 DIAGNOSIS — F330 Major depressive disorder, recurrent, mild: Secondary | ICD-10-CM

## 2022-05-25 LAB — POCT URINALYSIS DIP (CLINITEK)
Glucose, UA: 250 mg/dL — AB
Ketones, POC UA: NEGATIVE mg/dL
Leukocytes, UA: NEGATIVE
Nitrite, UA: NEGATIVE
POC PROTEIN,UA: NEGATIVE
Spec Grav, UA: >=1.03 — AB (ref 1.010–1.025)
Urobilinogen, UA: 0.2 E.U./dL
pH, UA: 5 (ref 5.0–8.0)

## 2022-05-25 MED ORDER — DOXYCYCLINE HYCLATE 100 MG PO TABS
100.0000 mg | ORAL_TABLET | Freq: Two times a day (BID) | ORAL | 0 refills | Status: DC
Start: 2022-05-25 — End: 2022-06-22

## 2022-05-25 MED ORDER — ALBUTEROL SULFATE HFA 108 (90 BASE) MCG/ACT IN AERS
2.0000 | INHALATION_SPRAY | Freq: Four times a day (QID) | RESPIRATORY_TRACT | 1 refills | Status: DC | PRN
Start: 2022-05-25 — End: 2022-07-13

## 2022-05-25 MED ORDER — RAMELTEON 8 MG PO TABS
8.0000 mg | ORAL_TABLET | Freq: Every day | ORAL | 1 refills | Status: DC
Start: 2022-05-25 — End: 2022-09-14

## 2022-05-25 MED ORDER — LOSARTAN POTASSIUM 50 MG PO TABS: 50.0000 mg | ORAL_TABLET | Freq: Every day | ORAL | 0 refills | Status: DC

## 2022-05-25 NOTE — Patient Instructions (Addendum)
Decrease losartan 50 mg daily due to low bp.   Restart vraylar 1.5 mg daily x 1 day, then increase to 2 daily.  Recommend call Dr. Betsy Pries to set up counseling.  For insomnia: Start on rozerem 8 mg once daily at night.  Discontinue ambien.   For bronchitis/sinusitis: Doxycycline 100 mg twice daily x 10 days. Albuterol inhaler 2 puffs 4 times daily over the next 48 hours Go down to 4 times daily as needed shortness of breath/wheezing.

## 2022-05-26 LAB — LIPID PANEL
Chol/HDL Ratio: 4 ratio (ref 0.0–4.4)
Cholesterol, Total: 148 mg/dL (ref 100–199)
HDL: 37 mg/dL — ABNORMAL LOW (ref >39)
LDL Chol Calc (NIH): 84 mg/dL (ref 0–99)
Triglycerides: 155 mg/dL — ABNORMAL HIGH (ref 0–149)
VLDL Cholesterol Cal: 27 mg/dL (ref 5–40)

## 2022-05-26 LAB — CBC WITH DIFFERENTIAL/PLATELET
Basophils Absolute: 0 10*3/uL (ref 0.0–0.2)
Basos: 1 %
EOS (ABSOLUTE): 0.1 10*3/uL (ref 0.0–0.4)
Eos: 2 %
Hematocrit: 37.9 % (ref 34.0–46.6)
Hemoglobin: 11.6 g/dL (ref 11.1–15.9)
Immature Grans (Abs): 0 10*3/uL (ref 0.0–0.1)
Immature Granulocytes: 0 %
Lymphocytes Absolute: 1.5 10*3/uL (ref 0.7–3.1)
Lymphs: 24 %
MCH: 26.4 pg — ABNORMAL LOW (ref 26.6–33.0)
MCHC: 30.6 g/dL — ABNORMAL LOW (ref 31.5–35.7)
MCV: 86 fL (ref 79–97)
Monocytes Absolute: 0.3 10*3/uL (ref 0.1–0.9)
Monocytes: 5 %
Neutrophils Absolute: 4.4 10*3/uL (ref 1.4–7.0)
Neutrophils: 68 %
Platelets: 107 10*3/uL — ABNORMAL LOW (ref 150–450)
RBC: 4.39 x10E6/uL (ref 3.77–5.28)
RDW: 16.5 % — ABNORMAL HIGH (ref 11.7–15.4)
WBC: 6.3 10*3/uL (ref 3.4–10.8)

## 2022-05-26 LAB — PAIN MGT SCRN (14 DRUGS), UR
Amphetamine Scrn, Ur: POSITIVE ng/mL — AB
BARBITURATE SCREEN URINE: NEGATIVE ng/mL
BENZODIAZEPINE SCREEN, URINE: NEGATIVE ng/mL
Buprenorphine, Urine: NEGATIVE ng/mL
CANNABINOIDS UR QL SCN: NEGATIVE ng/mL
Cocaine (Metab) Scrn, Ur: NEGATIVE ng/mL
Creatinine(Crt), U: 128.1 mg/dL (ref 20.0–300.0)
Fentanyl, Urine: NEGATIVE pg/mL
Meperidine Screen, Urine: NEGATIVE ng/mL
Methadone Screen, Urine: NEGATIVE ng/mL
OXYCODONE+OXYMORPHONE UR QL SCN: NEGATIVE ng/mL
Opiate Scrn, Ur: POSITIVE ng/mL — AB
Ph of Urine: 5.2 (ref 4.5–8.9)
Phencyclidine Qn, Ur: NEGATIVE ng/mL
Propoxyphene Scrn, Ur: NEGATIVE ng/mL
Tramadol Screen, Urine: NEGATIVE ng/mL

## 2022-05-26 LAB — COMPREHENSIVE METABOLIC PANEL
ALT: 25 IU/L (ref 0–32)
AST: 21 IU/L (ref 0–40)
Albumin/Globulin Ratio: 1.6 (ref 1.2–2.2)
Albumin: 4.1 g/dL (ref 3.8–4.9)
Alkaline Phosphatase: 112 IU/L (ref 44–121)
BUN/Creatinine Ratio: 16 (ref 9–23)
BUN: 19 mg/dL (ref 6–24)
Bilirubin Total: 0.6 mg/dL (ref 0.0–1.2)
CO2: 21 mmol/L (ref 20–29)
Calcium: 9 mg/dL (ref 8.7–10.2)
Chloride: 104 mmol/L (ref 96–106)
Creatinine, Ser: 1.18 mg/dL — ABNORMAL HIGH (ref 0.57–1.00)
Globulin, Total: 2.6 g/dL (ref 1.5–4.5)
Glucose: 97 mg/dL (ref 70–99)
Potassium: 4.5 mmol/L (ref 3.5–5.2)
Sodium: 142 mmol/L (ref 134–144)
Total Protein: 6.7 g/dL (ref 6.0–8.5)
eGFR: 55 mL/min/{1.73_m2} — ABNORMAL LOW (ref >59)

## 2022-05-26 LAB — MICROALBUMIN / CREATININE URINE RATIO
Creatinine, Urine: 133.8 mg/dL
Microalb/Creat Ratio: 18 mg/g creat (ref 0–29)
Microalbumin, Urine: 24.7 ug/mL

## 2022-05-26 LAB — TSH: TSH: 3.15 u[IU]/mL (ref 0.450–4.500)

## 2022-05-26 LAB — HEMOGLOBIN A1C
Est. average glucose Bld gHb Est-mCnc: 151 mg/dL
Hgb A1c MFr Bld: 6.9 % — ABNORMAL HIGH (ref 4.8–5.6)

## 2022-05-26 LAB — CARDIOVASCULAR RISK ASSESSMENT

## 2022-05-27 ENCOUNTER — Telehealth: Payer: Self-pay

## 2022-05-27 LAB — URINE CULTURE

## 2022-05-27 NOTE — Telephone Encounter (Signed)
Courtney from [employer] called stating that patient told them that the gabapentin 800 mg is too big for her to swallow, so they are requesting if we could switch her to the Gabapentin 400 mg capsules but to her updated dose to be sent in to the pharmacy. Please advise.

## 2022-05-29 ENCOUNTER — Encounter: Payer: Self-pay | Admitting: Family Medicine

## 2022-05-29 ENCOUNTER — Other Ambulatory Visit: Payer: Self-pay | Admitting: Family Medicine

## 2022-05-29 DIAGNOSIS — F5102 Adjustment insomnia: Secondary | ICD-10-CM | POA: Insufficient documentation

## 2022-05-29 DIAGNOSIS — R310 Gross hematuria: Secondary | ICD-10-CM | POA: Insufficient documentation

## 2022-05-29 DIAGNOSIS — R42 Dizziness and giddiness: Secondary | ICD-10-CM | POA: Insufficient documentation

## 2022-05-29 DIAGNOSIS — J0101 Acute recurrent maxillary sinusitis: Secondary | ICD-10-CM | POA: Insufficient documentation

## 2022-05-29 DIAGNOSIS — J209 Acute bronchitis, unspecified: Secondary | ICD-10-CM | POA: Insufficient documentation

## 2022-05-29 DIAGNOSIS — J0100 Acute maxillary sinusitis, unspecified: Secondary | ICD-10-CM | POA: Insufficient documentation

## 2022-05-29 MED ORDER — CARIPRAZINE HCL 3 MG PO CAPS: 3.0000 mg | ORAL_CAPSULE | Freq: Every day | ORAL | 0 refills | Status: DC

## 2022-05-29 MED ORDER — GABAPENTIN 400 MG PO CAPS
800.0000 mg | ORAL_CAPSULE | Freq: Three times a day (TID) | ORAL | 1 refills | Status: DC
Start: 2022-05-29 — End: 2022-06-06

## 2022-05-29 NOTE — Assessment & Plan Note (Signed)
Start on rozerem 8 mg once daily at night.  Discontinue ambien.

## 2022-05-29 NOTE — Assessment & Plan Note (Signed)
Prescription: doxy Sent for urine culture.

## 2022-05-29 NOTE — Assessment & Plan Note (Signed)
Doxycycline 100 mg twice daily x 10 days. Albuterol inhaler 2 puffs 4 times daily over the next 48 hours Go down to 4 times daily as needed shortness of breath/wheezing.

## 2022-05-29 NOTE — Assessment & Plan Note (Signed)
Decrease losartan 50 mg daily due to low bp.

## 2022-05-29 NOTE — Assessment & Plan Note (Signed)
Doxycycline

## 2022-06-03 DIAGNOSIS — Z8709 Personal history of other diseases of the respiratory system: Secondary | ICD-10-CM | POA: Diagnosis not present

## 2022-06-06 ENCOUNTER — Other Ambulatory Visit: Payer: Self-pay

## 2022-06-06 MED ORDER — GABAPENTIN 400 MG PO CAPS: 800.0000 mg | ORAL_CAPSULE | Freq: Four times a day (QID) | ORAL | 1 refills | Status: DC

## 2022-06-07 ENCOUNTER — Ambulatory Visit: Payer: PPO

## 2022-06-07 DIAGNOSIS — Z89411 Acquired absence of right great toe: Secondary | ICD-10-CM

## 2022-06-07 DIAGNOSIS — Z89412 Acquired absence of left great toe: Secondary | ICD-10-CM

## 2022-06-07 DIAGNOSIS — M2042 Other hammer toe(s) (acquired), left foot: Secondary | ICD-10-CM

## 2022-06-07 DIAGNOSIS — M2141 Flat foot [pes planus] (acquired), right foot: Secondary | ICD-10-CM

## 2022-06-07 DIAGNOSIS — E1142 Type 2 diabetes mellitus with diabetic polyneuropathy: Secondary | ICD-10-CM

## 2022-06-07 NOTE — Progress Notes (Unsigned)
Patient presents to the office today for diabetic shoe and insole measuring.  ABN signed.   Documentation of medical necessity will be sent to patient's treating diabetic doctor to verify and sign.   Patient's diabetic provider: DR [name]  Shoes and insoles will be ordered at that time and patient will be notified for an appointment for fitting when they arrive.   Patient shoe selection-   1st   Shoe choice:   [id number]  Shoe size ordered: 7

## 2022-06-08 ENCOUNTER — Ambulatory Visit (INDEPENDENT_AMBULATORY_CARE_PROVIDER_SITE_OTHER): Payer: PPO

## 2022-06-08 DIAGNOSIS — R310 Gross hematuria: Secondary | ICD-10-CM | POA: Diagnosis not present

## 2022-06-08 LAB — POCT URINALYSIS DIP (CLINITEK)
Bilirubin, UA: NEGATIVE
Blood, UA: NEGATIVE
Glucose, UA: >=1000 mg/dL — AB
Ketones, POC UA: NEGATIVE mg/dL
Leukocytes, UA: NEGATIVE
Nitrite, UA: NEGATIVE
POC PROTEIN,UA: NEGATIVE
Spec Grav, UA: 1.025 (ref 1.010–1.025)
Urobilinogen, UA: 0.2 E.U./dL
pH, UA: 5 (ref 5.0–8.0)

## 2022-06-08 NOTE — Progress Notes (Signed)
Patient presents today for 2 week UA.  Patient was treated with Doxycycline starting 05/25/22; urine has improved.  Component     Latest Ref Rng 05/25/2022 06/08/2022  Color, UA     yellow  straw !  yellow   Clarity, UA     clear  clear  clear   Glucose     negative mg/dL =161 !  >=1,000 !   Bilirubin, UA     negative  small !  negative   Ketones, UA     negative mg/dL negative  negative   Specific Gravity, UA     1.010 - 1.025  >=1.030 !  1.025   RBC, UA     negative  moderate !  negative   pH, UA     5.0 - 8.0  5.0  5.0   POC PROTEIN,UA     negative, trace  negative  negative   Urobilinogen, UA     0.2 or 1.0 E.U./dL 0.2  0.2   Nitrite, UA     Negative  Negative  Negative   Leukocytes,UA     Negative  Negative  Negative     Legend: ! Abnormal

## 2022-06-09 ENCOUNTER — Telehealth: Payer: PPO

## 2022-06-09 ENCOUNTER — Other Ambulatory Visit: Payer: Self-pay

## 2022-06-09 ENCOUNTER — Telehealth: Payer: Self-pay | Admitting: Family Medicine

## 2022-06-09 ENCOUNTER — Ambulatory Visit (INDEPENDENT_AMBULATORY_CARE_PROVIDER_SITE_OTHER): Payer: PPO

## 2022-06-09 DIAGNOSIS — E1121 Type 2 diabetes mellitus with diabetic nephropathy: Secondary | ICD-10-CM

## 2022-06-09 DIAGNOSIS — Z794 Long term (current) use of insulin: Secondary | ICD-10-CM

## 2022-06-09 DIAGNOSIS — I152 Hypertension secondary to endocrine disorders: Secondary | ICD-10-CM

## 2022-06-09 DIAGNOSIS — E1169 Type 2 diabetes mellitus with other specified complication: Secondary | ICD-10-CM

## 2022-06-09 DIAGNOSIS — R310 Gross hematuria: Secondary | ICD-10-CM

## 2022-06-09 DIAGNOSIS — F332 Major depressive disorder, recurrent severe without psychotic features: Secondary | ICD-10-CM

## 2022-06-09 NOTE — Patient Instructions (Signed)
Please call the care guide team at [phone] if you need to cancel or reschedule your appointment.   If you are experiencing a Mental Health or Behavioral Health Crisis or need someone to talk to, please call the Suicide and Crisis Lifeline: 988 call the [place] National Suicide Prevention Lifeline: 857-402-2799 or TTY: 726 829 1478 TTY 412 733 6944) to talk to a trained counselor call 1-800-273-TALK (toll free, 24 hour hotline)   Following is a copy of the CCM Program Consent:  CCM service includes personalized support from designated clinical staff supervised by the physician, including individualized plan of care and coordination with other care providers 24/7 contact phone numbers for assistance for urgent and routine care needs. Service will only be billed when office clinical staff spend 20 minutes or more in a month to coordinate care. Only one practitioner may furnish and bill the service in a calendar month. The patient may stop CCM services at amy time (effective at the end of the month) by phone call to the office staff. The patient will be responsible for cost sharing (co-pay) or up to 20% of the service fee (after annual deductible is met)  Following is a copy of your full provider care plan:   Goals Addressed             This Visit's Progress    CCM Expected Outcome:  Monitor, Self-Manage and Reduce Symptoms of Diabetes       Current Barriers:  Knowledge Deficits related to concerns about places on her toes and not being able to be seen by podiatrist until next week, the patient expressed concern over being diabetic and not wanting to lose her toes Care Coordination needs related to resources in the area to help with her expressed needs and caring for her self due to lack of support system in a patient with DM Chronic Disease Management support and education needs related to effective management of DM Lacks caregiver support.  Lab Results  Component Value Date   HGBA1C  6.9 (H) 05/25/2022     Planned Interventions: Provided education to patient about basic DM disease process. The patient states that her blood sugars have been up and down. The patient is under goal with her A1c states she is doing well with DM management. Reviewed medications with patient and discussed importance of medication adherence. The patient is compliant with her medications. Denies any acute changes in her medications. ;        Reviewed prescribed diet with patient heart healthy/ADA diet. Is compliant with her diet; Counseled on importance of regular laboratory monitoring as prescribed. Has regular labwork ;        Discussed plans with patient for ongoing care management follow up and provided patient with direct contact information for care management team;      Provided patient with written educational materials related to hypo and hyperglycemia and importance of correct treatment. The lowest the patient has seen is 100 and the highest is 200. ;       Reviewed scheduled/upcoming provider appointments including: 09-14-2022 at 8 am with the pcp;         Advised patient, providing education and rationale, to check cbg when you have symptoms of low or high blood sugar and has a continuous glucose reader, freestyle [place]  and record. The patient states that her blood sugars have been up and down. Range is usually around 100-200.        call provider for findings outside established parameters;  Referral made to pharmacy team for assistance with ongoing support and education for medication needs ;       Referral made to social work team for assistance with ongoing support and education for effective management of stress, anxiety, depression ;      Referral made to community resources care guide team for assistance with food resources and community resources;      Review of patient status, including review of consultants reports, relevant laboratory and other test results, and medications  completed;       Advised patient to discuss changes in her DM and questions and concerns with provider;      Screening for signs and symptoms of depression related to chronic disease state;        Assessed social determinant of health barriers;        The patient states she is having more pain in her legs and feet and it is sometimes unbearable. The patient states that she has had a change in her medications but cannot tell that it has helped. Ask for recommendations. Was fitted yesterday for therapeutic shoes. Will send an in basket message to the pcp asking for recommendations.   Symptom Management: Take medications as prescribed   Attend all scheduled provider appointments Call provider office for new concerns or questions  call the Suicide and Crisis Lifeline: 988 call the [place] National Suicide Prevention Lifeline: 941-453-6448 or TTY: (364) 719-7695 TTY [phone]) to talk to a trained counselor call 1-800-273-TALK (toll free, 24 hour hotline) if experiencing a Mental Health or Behavioral Health Crisis  keep appointment with eye doctor check feet daily for cuts, sores or redness trim toenails straight across manage portion size wash and dry feet carefully every day wear comfortable, cotton socks wear comfortable, well-fitting shoes  Follow Up Plan: Telephone follow up appointment with care management team member scheduled for: 07-27-2022 at 1145 am       CCM Expected Outcome:  Monitor, Self-Manage and Reduce Symptoms of HLD       Current Barriers:  Chronic Disease Management support and education needs related to effective management of HLD Lab Results  Component Value Date   CHOL 148 05/25/2022   HDL 37 (L) 05/25/2022   LDLCALC 84 05/25/2022   TRIG 155 (H) 05/25/2022   CHOLHDL 4.0 05/25/2022     Planned Interventions: Provider established cholesterol goals reviewed. Review of labs. The patient is above goal on her triglycerides. Education and support given. ; Counseled on  importance of regular laboratory monitoring as prescribed; Provided HLD educational materials; Reviewed role and benefits of statin for ASCVD risk reduction. States compliance with medications; Discussed strategies to manage statin-induced myalgias. The patient takes Lipitor 10 mg QD; Reviewed importance of limiting foods high in cholesterol. Education and support provided on a heart healthy/ADA diet. The patient tries to watch what she is eating. ; Screening for signs and symptoms of depression related to chronic disease state;  Assessed social determinant of health barriers;   Symptom Management: Take medications as prescribed   Attend all scheduled provider appointments Call provider office for new concerns or questions  call the Suicide and Crisis Lifeline: 988 call the [place] National Suicide Prevention Lifeline: 667-408-3949 or TTY: (820) 008-6131 TTY [phone]) to talk to a trained counselor call 1-800-273-TALK (toll free, 24 hour hotline) if experiencing a Mental Health or Behavioral Health Crisis  - take all medications exactly as prescribed - call doctor with any symptoms you believe are related to your medicine - call doctor  when you experience any new symptoms - go to all doctor appointments as scheduled - adhere to prescribed diet: heart healthy/ADA diet   Follow Up Plan: Telephone follow up appointment with care management team member scheduled for: 07-27-2022 at 1145 am       CCM Expected Outcome:  Monitor, Self-Manage and Reduce Symptoms of: Depression       Current Barriers:  Knowledge Deficits related to how to manage emotions and stress level in a patient with multiple chronic conditions including depression Care Coordination needs related to resources in the community for food insecurities and additional support for social worker support  in a patient with depression and increased stress level Chronic Disease Management support and education needs related to effective  management of depression  Lacks caregiver support.   Planned Interventions: Evaluation of current treatment plan related to depression  and patient's adherence to plan as established by provider. The patient states she has good days and bad days. She states that some days she feels like she can do anything but her health limits her in what she can do. Some days she does not feel like getting up out of the bed and sometimes that is because of the pain she has. She is having a good day today and could not talk long as the maintenance person was there to fix her tv. She knows to call her specialist and keep in contact with the specialist for ongoing support related to effective management of depression. Reflective listening and support given.  Advised patient to call the office for changes in mood, anxiety, depression, stress level, or mental health needs  Provided education to patient re: doing mindfulness activities, journal writing, creative ways to help her when she is stressed out. The patient is doing much better and her feet are healing. She states that she is having good days and some bad days. Overall she is in good spirits and is thankful for the support of her providers. She sees providers on a regular basis.  Reviewed medications with patient and discussed compliance. States she is compliant with medications Collaborated with LCSW and scheduling care guides regarding the need for follow up by LCSW. The LCSW has worked with the patient before for depression and other needs Provided patient with resources and support for effective management of depression  educational materials related to managing her stress level and depression  Reviewed scheduled/upcoming provider appointments including 09-14-2022 at 8 am Care Guide referral for food insecurities and scheduling follow up with LCSW Social Work referral for support and education needs related to depression and stress. Ongoing support and education  from the LCSW.  Discussed plans with patient for ongoing care management follow up and provided patient with direct contact information for care management team Advised patient to discuss changes in her depression and anxiety level, questions and concerns on how to best manage her chronic care needs due to limited support system with provider Screening for signs and symptoms of depression related to chronic disease state  Assessed social determinant of health barriers  Symptom Management: Take medications as prescribed   Attend all scheduled provider appointments Call provider office for new concerns or questions  call the Suicide and Crisis Lifeline: 988 call the [place] National Suicide Prevention Lifeline: (412) 241-7987 or TTY: (818) 033-6924 TTY (406)650-2960) to talk to a trained counselor call 1-800-273-TALK (toll free, 24 hour hotline) if experiencing a Mental Health or Behavioral Health Crisis   Follow Up Plan: Telephone follow up appointment with care  management team member scheduled for: 07-27-2022 at 1145 am       CCM Expected Outcome:  Monitor, Self-Manage, and Reduce Symptoms of Hypertension       Current Barriers:  Chronic Disease Management support and education needs related to effective management of HTN Lacks caregiver support.   BP Readings from Last 3 Encounters:  05/25/22 (!) 110/56  04/21/22 (!) 112/56  03/04/22 (!) 107/57    Planned Interventions: Evaluation of current treatment plan related to hypertension self management and patient's adherence to plan as established by provider. The patients blood pressures are a little on the low side. She denies any light headedness or dizziness. She gave today's reading of 112/64. Has had adjustments in her medications. Education provided to call the provider for acute changes in blood pressures   Provided education to patient re: stroke prevention, s/s of heart attack and stroke; Reviewed prescribed diet heart  healthy/ADA diet.  The patient is monitoring her dietary intake. Reviewed medications with patient and discussed importance of compliance. The patient states compliance with medications. Had recent changes in her blood pressure medications;  Discussed plans with patient for ongoing care management follow up and provided patient with direct contact information for care management team; Advised patient, providing education and rationale, to monitor blood pressure daily and record, calling PCP for findings outside established parameters;  Reviewed scheduled/upcoming provider appointments including: 09-14-2022 at 8 am Advised patient to discuss changes in blood pressures and heart health with provider; Provided education on prescribed diet heart healthy/ADA diet ;  Discussed complications of poorly controlled blood pressure such as heart disease, stroke, circulatory complications, vision complications, kidney impairment, sexual dysfunction;  Screening for signs and symptoms of depression related to chronic disease state;  Assessed social determinant of health barriers;   Symptom Management: Take medications as prescribed   Attend all scheduled provider appointments Call provider office for new concerns or questions  call the Suicide and Crisis Lifeline: 988 call the [place] National Suicide Prevention Lifeline: 216-684-9155 or TTY: (206) 669-6096 TTY 445-467-2568) to talk to a trained counselor call 1-800-273-TALK (toll free, 24 hour hotline) if experiencing a Mental Health or Behavioral Health Crisis  check blood pressure weekly learn about high blood pressure call doctor for signs and symptoms of high blood pressure develop an action plan for high blood pressure keep all doctor appointments take medications for blood pressure exactly as prescribed report new symptoms to your doctor  Follow Up Plan: Telephone follow up appointment with care management team member scheduled for: 07-27-2022 at 1145 am           Patient verbalizes understanding of instructions and care plan provided today and agrees to view in MyChart. Active MyChart status and patient understanding of how to access instructions and care plan via MyChart confirmed with patient.  Telephone follow up appointment with care management team member scheduled for: 07-27-2022 at 1145 am

## 2022-06-09 NOTE — Chronic Care Management (AMB) (Signed)
Chronic Care Management   CCM RN Visit Note  06/09/2022 Name: Linda [name] MRN: [id number] DOB: 1970/01/27  Subjective: Linda [name] is a 52 y.o. year old female who is a primary care patient of [name], Kirsten, MD. The patient was referred to the Chronic Care Management team for assistance with care management needs subsequent to provider initiation of CCM services and plan of care.    Today's Visit:  Engaged with patient by telephone for follow up visit.        Goals Addressed             This Visit's Progress    CCM Expected Outcome:  Monitor, Self-Manage and Reduce Symptoms of Diabetes       Current Barriers:  Knowledge Deficits related to concerns about places on her toes and not being able to be seen by podiatrist until next week, the patient expressed concern over being diabetic and not wanting to lose her toes Care Coordination needs related to resources in the area to help with her expressed needs and caring for her self due to lack of support system in a patient with DM Chronic Disease Management support and education needs related to effective management of DM Lacks caregiver support.  Lab Results  Component Value Date   HGBA1C 6.9 (H) 05/25/2022     Planned Interventions: Provided education to patient about basic DM disease process. The patient states that her blood sugars have been up and down. The patient is under goal with her A1c states she is doing well with DM management. Reviewed medications with patient and discussed importance of medication adherence. The patient is compliant with her medications. Denies any acute changes in her medications. ;        Reviewed prescribed diet with patient heart healthy/ADA diet. Is compliant with her diet; Counseled on importance of regular laboratory monitoring as prescribed. Has regular labwork ;        Discussed plans with patient for ongoing care management follow up and provided patient with direct contact  information for care management team;      Provided patient with written educational materials related to hypo and hyperglycemia and importance of correct treatment. The lowest the patient has seen is 100 and the highest is 200. ;       Reviewed scheduled/upcoming provider appointments including: 09-14-2022 at 8 am with the pcp;         Advised patient, providing education and rationale, to check cbg when you have symptoms of low or high blood sugar and has a continuous glucose reader, freestyle [place]  and record. The patient states that her blood sugars have been up and down. Range is usually around 100-200.        call provider for findings outside established parameters;       Referral made to pharmacy team for assistance with ongoing support and education for medication needs ;       Referral made to social work team for assistance with ongoing support and education for effective management of stress, anxiety, depression ;      Referral made to community resources care guide team for assistance with food resources and community resources;      Review of patient status, including review of consultants reports, relevant laboratory and other test results, and medications completed;       Advised patient to discuss changes in her DM and questions and concerns with provider;      Screening for signs and  symptoms of depression related to chronic disease state;        Assessed social determinant of health barriers;        The patient states she is having more pain in her legs and feet and it is sometimes unbearable. The patient states that she has had a change in her medications but cannot tell that it has helped. Ask for recommendations. Was fitted yesterday for therapeutic shoes. Will send an in basket message to the pcp asking for recommendations.   Symptom Management: Take medications as prescribed   Attend all scheduled provider appointments Call provider office for new concerns or questions  call  the Suicide and Crisis Lifeline: 988 call the [place] National Suicide Prevention Lifeline: (480)725-7165 or TTY: (318)176-9730 TTY 4500851774) to talk to a trained counselor call 1-800-273-TALK (toll free, 24 hour hotline) if experiencing a Mental Health or Behavioral Health Crisis  keep appointment with eye doctor check feet daily for cuts, sores or redness trim toenails straight across manage portion size wash and dry feet carefully every day wear comfortable, cotton socks wear comfortable, well-fitting shoes  Follow Up Plan: Telephone follow up appointment with care management team member scheduled for: 07-27-2022 at 1145 am       CCM Expected Outcome:  Monitor, Self-Manage and Reduce Symptoms of HLD       Current Barriers:  Chronic Disease Management support and education needs related to effective management of HLD Lab Results  Component Value Date   CHOL 148 05/25/2022   HDL 37 (L) 05/25/2022   LDLCALC 84 05/25/2022   TRIG 155 (H) 05/25/2022   CHOLHDL 4.0 05/25/2022     Planned Interventions: Provider established cholesterol goals reviewed. Review of labs. The patient is above goal on her triglycerides. Education and support given. ; Counseled on importance of regular laboratory monitoring as prescribed; Provided HLD educational materials; Reviewed role and benefits of statin for ASCVD risk reduction. States compliance with medications; Discussed strategies to manage statin-induced myalgias. The patient takes Lipitor 10 mg QD; Reviewed importance of limiting foods high in cholesterol. Education and support provided on a heart healthy/ADA diet. The patient tries to watch what she is eating. ; Screening for signs and symptoms of depression related to chronic disease state;  Assessed social determinant of health barriers;   Symptom Management: Take medications as prescribed   Attend all scheduled provider appointments Call provider office for new concerns or questions  call  the Suicide and Crisis Lifeline: 988 call the [place] National Suicide Prevention Lifeline: (780)659-2089 or TTY: (917)413-6991 TTY 628-699-3533) to talk to a trained counselor call 1-800-273-TALK (toll free, 24 hour hotline) if experiencing a Mental Health or Behavioral Health Crisis  - take all medications exactly as prescribed - call doctor with any symptoms you believe are related to your medicine - call doctor when you experience any new symptoms - go to all doctor appointments as scheduled - adhere to prescribed diet: heart healthy/ADA diet   Follow Up Plan: Telephone follow up appointment with care management team member scheduled for: 07-27-2022 at 1145 am       CCM Expected Outcome:  Monitor, Self-Manage and Reduce Symptoms of: Depression       Current Barriers:  Knowledge Deficits related to how to manage emotions and stress level in a patient with multiple chronic conditions including depression Care Coordination needs related to resources in the community for food insecurities and additional support for social worker support  in a patient with depression and increased stress  level Chronic Disease Management support and education needs related to effective management of depression  Lacks caregiver support.   Planned Interventions: Evaluation of current treatment plan related to depression  and patient's adherence to plan as established by provider. The patient states she has good days and bad days. She states that some days she feels like she can do anything but her health limits her in what she can do. Some days she does not feel like getting up out of the bed and sometimes that is because of the pain she has. She is having a good day today and could not talk long as the maintenance person was there to fix her tv. She knows to call her specialist and keep in contact with the specialist for ongoing support related to effective management of depression. Reflective listening and support  given.  Advised patient to call the office for changes in mood, anxiety, depression, stress level, or mental health needs  Provided education to patient re: doing mindfulness activities, journal writing, creative ways to help her when she is stressed out. The patient is doing much better and her feet are healing. She states that she is having good days and some bad days. Overall she is in good spirits and is thankful for the support of her providers. She sees providers on a regular basis.  Reviewed medications with patient and discussed compliance. States she is compliant with medications Collaborated with LCSW and scheduling care guides regarding the need for follow up by LCSW. The LCSW has worked with the patient before for depression and other needs Provided patient with resources and support for effective management of depression  educational materials related to managing her stress level and depression  Reviewed scheduled/upcoming provider appointments including 09-14-2022 at 8 am Care Guide referral for food insecurities and scheduling follow up with LCSW Social Work referral for support and education needs related to depression and stress. Ongoing support and education from the LCSW.  Discussed plans with patient for ongoing care management follow up and provided patient with direct contact information for care management team Advised patient to discuss changes in her depression and anxiety level, questions and concerns on how to best manage her chronic care needs due to limited support system with provider Screening for signs and symptoms of depression related to chronic disease state  Assessed social determinant of health barriers  Symptom Management: Take medications as prescribed   Attend all scheduled provider appointments Call provider office for new concerns or questions  call the Suicide and Crisis Lifeline: 988 call the [place] National Suicide Prevention Lifeline: (803) 054-8550 or  TTY: (857) 060-0634 TTY 919-513-7227) to talk to a trained counselor call 1-800-273-TALK (toll free, 24 hour hotline) if experiencing a Mental Health or Behavioral Health Crisis   Follow Up Plan: Telephone follow up appointment with care management team member scheduled for: 07-27-2022 at 1145 am       CCM Expected Outcome:  Monitor, Self-Manage, and Reduce Symptoms of Hypertension       Current Barriers:  Chronic Disease Management support and education needs related to effective management of HTN Lacks caregiver support.   BP Readings from Last 3 Encounters:  05/25/22 (!) 110/56  04/21/22 (!) 112/56  03/04/22 (!) 107/57    Planned Interventions: Evaluation of current treatment plan related to hypertension self management and patient's adherence to plan as established by provider. The patients blood pressures are a little on the low side. She denies any light headedness or dizziness. She gave today's  reading of 112/64. Has had adjustments in her medications. Education provided to call the provider for acute changes in blood pressures   Provided education to patient re: stroke prevention, s/s of heart attack and stroke; Reviewed prescribed diet heart  healthy/ADA diet. The patient is monitoring her dietary intake. Reviewed medications with patient and discussed importance of compliance. The patient states compliance with medications. Had recent changes in her blood pressure medications;  Discussed plans with patient for ongoing care management follow up and provided patient with direct contact information for care management team; Advised patient, providing education and rationale, to monitor blood pressure daily and record, calling PCP for findings outside established parameters;  Reviewed scheduled/upcoming provider appointments including: 09-14-2022 at 8 am Advised patient to discuss changes in blood pressures and heart health with provider; Provided education on prescribed diet heart  healthy/ADA diet ;  Discussed complications of poorly controlled blood pressure such as heart disease, stroke, circulatory complications, vision complications, kidney impairment, sexual dysfunction;  Screening for signs and symptoms of depression related to chronic disease state;  Assessed social determinant of health barriers;   Symptom Management: Take medications as prescribed   Attend all scheduled provider appointments Call provider office for new concerns or questions  call the Suicide and Crisis Lifeline: 988 call the [place] National Suicide Prevention Lifeline: (954)640-3758 or TTY: 413-109-4355 TTY (281) 641-2930) to talk to a trained counselor call 1-800-273-TALK (toll free, 24 hour hotline) if experiencing a Mental Health or Behavioral Health Crisis  check blood pressure weekly learn about high blood pressure call doctor for signs and symptoms of high blood pressure develop an action plan for high blood pressure keep all doctor appointments take medications for blood pressure exactly as prescribed report new symptoms to your doctor  Follow Up Plan: Telephone follow up appointment with care management team member scheduled for: 07-27-2022 at 1145 am          Plan:Telephone follow up appointment with care management team member scheduled for:  07-27-2022 at 1145 am  Alto Denver RN, MSN, CCM RN Care Manager  Chronic Care Management Direct Number: [phone]

## 2022-06-09 NOTE — Progress Notes (Signed)
Referral ordered for urology.

## 2022-06-09 NOTE — Telephone Encounter (Signed)
[facility] FORMS DATED (05/13/22) FOR THERAPEUTIC SHOES

## 2022-06-10 ENCOUNTER — Telehealth: Payer: Self-pay

## 2022-06-10 ENCOUNTER — Other Ambulatory Visit: Payer: Self-pay | Admitting: Oncology

## 2022-06-10 DIAGNOSIS — J209 Acute bronchitis, unspecified: Secondary | ICD-10-CM

## 2022-06-10 NOTE — Telephone Encounter (Signed)
Patient aware and medication has already been refilled.

## 2022-06-10 NOTE — Telephone Encounter (Signed)
-----   Message from [name], MD sent at 06/10/2022  8:43 AM EDT ----- Regarding: call I have request to refill her albuterol inhaler, she should have [name] take care of that

## 2022-06-10 NOTE — Telephone Encounter (Signed)
PCP should handle this

## 2022-06-16 ENCOUNTER — Telehealth: Payer: Self-pay | Admitting: Podiatry

## 2022-06-16 NOTE — Telephone Encounter (Signed)
Pt has appt on 6.3 with Dr Annamary Rummage in [place] office but states she had surgery in February and the foot is having sharp, shooting pain and a toe is blue. She wanted to come in sooner. Dr Annamary Rummage is not in [place] office next week and Dr Carlota Raspberry is only there eth one day and is full. I did offer an appt in [place] but pt said no she does not drive in [place]. I transferred to the nurse for possible advice.

## 2022-06-16 NOTE — Telephone Encounter (Signed)
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 She can hardly walk. Please contact patient.   Left message on RN line.

## 2022-06-21 ENCOUNTER — Ambulatory Visit: Payer: Self-pay | Admitting: Licensed Clinical Social Worker

## 2022-06-21 ENCOUNTER — Telehealth: Payer: Self-pay

## 2022-06-21 NOTE — Telephone Encounter (Signed)
Patient is calling stating that she was instructed to call us per Podiatry. She states that she had called to see if the podiatrist could call her in some pain medication for her foot that she states either has a pinched nerve or nerve damage in foot that the pain runs up her leg and keeps her up at night. She states that her appointment with them is on 06/27/22 and they stated they can't get her any pain medication and that she would need to ask you. Please advise.

## 2022-06-21 NOTE — Telephone Encounter (Signed)
Patient informed and scheduled to come in tomorrow.

## 2022-06-21 NOTE — Patient Instructions (Signed)
Visit Information  Thank you for taking time to visit with me today. Please don't hesitate to contact me if I can be of assistance to you.   Following are the goals we discussed today:   Goals Addressed               This Visit's Progress     Patient Stated she has stress related to managing health issues. She has pain in her feet occasionally (pt-stated)        Interventions  Spoke with client about client needs Yamilee said she has pain in her feet, sometimes it is stinging pain . Sometimes pain in her feet is more like a stabbing pain. She is taking pain medication as prescribed Discussed mood of client. She said she feels anxious occasionally. She has talked with PCP before about mental health support. [name] has talked with client about psychiatric care (per information from [name]) [name] has appointment tomorrow with PCP; she may talk more with [name] tomorrow about psychiatric support for herself [name]) Encouraged client to talk with PCP about foot pain issues of client Reviewed program support for client  with RN, LCSW, Pharmacist Client said she has back pain issues.  She has difficulty walking longer distances Reviewed support with PCP, [name]. Discussed family support. Client has some support from a friend and from client's step-mother.  Reviewed medication procurement. Discussed transport needs of client.  She said she drives her car for short trips and errands. Provided counseling support for client Encouraged client to call LCSW for SW support as needed at [phone]. Malorie was appreciative of phone conversation with LCSW today          Our next appointment is by telephone on 08/17/22 at 10:00 AM   Please call the care guide team at [phone] if you need to cancel or reschedule your appointment.   If you are experiencing a Mental Health or Behavioral Health Crisis or need someone to talk to, please go to [facility] [address], [place] [phone])   The patient verbalized understanding of instructions, educational materials, and care plan provided today and DECLINED offer to receive copy of patient instructions, educational materials, and care plan.   The patient has been provided with contact information for the care management team and has been advised to call with any health related questions or concerns.   Kelton Pillar.Mahir Prabhakar MSW, LCSW Licensed Visual merchandiser [facility] Care Management [phone]

## 2022-06-21 NOTE — Patient Outreach (Signed)
Care Coordination   Follow Up Visit Note   06/21/2022 Name: Linda [name] MRN: [id number] DOB: [birth year]  Linda [name] is a 52 y.o. year old female who sees [name], [name], MD for primary care. I spoke with  Linda [name] by phone today.  What matters to the patients health and wellness today?  Patient has stress related to managing health issues.  She has pain in her feet occasionally    Goals Addressed               This Visit's Progress     Patient Stated she has stress related to managing health issues. She has pain in her feet occasionally (pt-stated)        Interventions  Spoke with client about client needs Linda [name] said she has pain in her feet, sometimes it is stinging pain . Sometimes pain in her feet is more like a stabbing pain. She is taking pain medication as prescribed Discussed mood of client. She said she feels anxious occasionally. She has talked with PCP before about mental health support. Dr. Sedalia [name] has talked with client about psychiatric care (per information from Linda [name]) Linda [name] has appointment tomorrow with PCP; she may talk more with Dr. Sedalia [name] tomorrow about psychiatric support for herself Linda [name]) Encouraged client to talk with PCP about foot pain issues of client Reviewed program support for client  with RN, LCSW, Pharmacist Client said she has back pain issues.  She has difficulty walking longer distances Reviewed support with PCP, Linda [name]. Discussed family support. Client has some support from a friend and from client's step-mother.  Reviewed medication procurement. Discussed transport needs of client.  She said she drives her car for short trips and errands. Provided counseling support for client Encouraged client to call LCSW for SW support as needed at [phone]. Linda [name] was appreciative of phone conversation with LCSW today          SDOH assessments and interventions completed:  Yes  SDOH Interventions  Today    Flowsheet Row Most Recent Value  SDOH Interventions   Depression Interventions/Treatment  Counseling  Physical Activity Interventions Other (Comments)  [some challenges in walking. edema occasionally in right ankle. pain in her feet]  Stress Interventions Provide Counseling  [client has stress related to managing medical needs]        Care Coordination Interventions:  Yes, provided   Interventions Today    Flowsheet Row Most Recent Value  Chronic Disease   Chronic disease during today's visit Other  [spoke with client about client needs]  General Interventions   General Interventions Discussed/Reviewed General Interventions Discussed, Community Resources  [discussed program support]  Exercise Interventions   Exercise Discussed/Reviewed Physical Activity  [client has walking challenges. has occasional swelling in right ankle. has pain in her feet]  Education Interventions   Education Provided Provided Education  Provided Verbal Education On Community Resources  Mental Health Interventions   Mental Health Discussed/Reviewed Anxiety, Coping Strategies  [discussed mood issues. Client said she has talked with PCP about client seeing psychiatrist . Client has appointment with PCP tomorrow and may talk more tomorrow with PCP about psychiatric support for client.]  Pharmacy Interventions   Pharmacy Dicussed/Reviewed Pharmacy Topics Discussed  Safety Interventions   Safety Discussed/Reviewed Fall Risk        Follow up plan: Follow up call scheduled for 08/17/22 at 10:00 AM    Encounter Outcome:  Pt. Visit Completed   Linda [name].Linda [name] MSW, [employer] Licensed  Clinical Social Worker [facility] Care Management [phone]

## 2022-06-22 ENCOUNTER — Ambulatory Visit (INDEPENDENT_AMBULATORY_CARE_PROVIDER_SITE_OTHER): Payer: PPO | Admitting: Family Medicine

## 2022-06-22 ENCOUNTER — Encounter: Payer: PPO | Admitting: Licensed Clinical Social Worker

## 2022-06-22 ENCOUNTER — Other Ambulatory Visit: Payer: Self-pay | Admitting: Family Medicine

## 2022-06-22 VITALS — BP 132/70 | HR 100 | Temp 97.4°F | Resp 18 | Ht 62.0 in | Wt 203.0 lb

## 2022-06-22 DIAGNOSIS — E1140 Type 2 diabetes mellitus with diabetic neuropathy, unspecified: Secondary | ICD-10-CM | POA: Diagnosis not present

## 2022-06-22 DIAGNOSIS — R21 Rash and other nonspecific skin eruption: Secondary | ICD-10-CM | POA: Diagnosis not present

## 2022-06-22 DIAGNOSIS — F5081 Binge eating disorder: Secondary | ICD-10-CM

## 2022-06-22 DIAGNOSIS — Z794 Long term (current) use of insulin: Secondary | ICD-10-CM | POA: Diagnosis not present

## 2022-06-22 DIAGNOSIS — R053 Chronic cough: Secondary | ICD-10-CM

## 2022-06-22 DIAGNOSIS — R059 Cough, unspecified: Secondary | ICD-10-CM | POA: Diagnosis not present

## 2022-06-22 MED ORDER — AMITRIPTYLINE HCL 25 MG PO TABS
25.0000 mg | ORAL_TABLET | Freq: Every day | ORAL | 2 refills | Status: DC
Start: 2022-06-22 — End: 2022-09-14

## 2022-06-22 NOTE — Assessment & Plan Note (Signed)
Vaseline or antibiotic ointment to be applied twice daily.

## 2022-06-22 NOTE — Assessment & Plan Note (Signed)
Begin elavil 25 mg daily.  Continue gabapentin at current dose.

## 2022-06-22 NOTE — Patient Instructions (Signed)
Begin elavil 25 mg daily. Apply vaseline or antibiotic ointment to rash in axillary area.   Continue gabapentin at current dose.  TENS unit.  ([place]).   Chest xray today.

## 2022-06-22 NOTE — Assessment & Plan Note (Signed)
Chest xray ordered

## 2022-06-22 NOTE — Progress Notes (Signed)
Acute Office Visit  Subjective:    Patient ID: Linda [name], female    DOB: 1970-02-21, 52 y.o.   MRN: [id number]  Chief Complaint  Patient presents with   Foot Pain    Right     HPI: Patient is in today for increasing  burning pain and numbness of right foot with radiating pain in her right heel.  She is having increasing problems with balance.  She has been elevating her foot and using her rollator at home to prevent falls.  She also has a rash in both axillary areas.     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 No longer has    Past Surgical History:  Procedure Laterality Date   AMPUTATION TOE Bilateral 03/04/2022   Procedure: AMPUTATION TOE RIGHT FOOT PARTIAL OR TOTAL GREAT TOE AND SECOND TOE, LEFT FOOT PARTIAL OR TOTAL GREAT TOE, SECOND AND THIRD TOE;  Surgeon: Pilar Plate, DPM;  Location: [facility];  Service:  Podiatry;  Laterality: Bilateral;   APPENDECTOMY     BACK SURGERY     x 3 lower disc   BILATERAL TOTAL MASTECTOMY WITH AXILLARY LYMPH NODE DISSECTION Bilateral 09/2019   CHOLECYSTECTOMY     nephrolithiasis     ROBOTIC ASSISTED TOTAL HYSTERECTOMY WITH BILATERAL SALPINGO OOPHERECTOMY Bilateral 06/14/2016   Procedure: ROBOTIC ASSISTED TOTAL HYSTERECTOMY WITH BILATERAL SALPINGO OOPHORECTOMY;  Surgeon: Cleda Mccreedy, MD;  Location: [facility];  Service: Gynecology;  Laterality: Bilateral;    Family History  Problem Relation Age of Onset   Cancer Mother        breast   CAD Father    Diabetes Father    Heart failure Father    Cancer Father        renal carcinoma   Kidney failure Father    Cancer Brother 65       renal carcinoma.   Stroke Paternal Grandmother     Social History   Socioeconomic History   Marital status: Divorced    Spouse name: Not on file   Number of children: Not on file   Years of education: Not on file   Highest education level: Not on file  Occupational History   Occupation: disabled  Tobacco Use   Smoking status: Never  Smokeless tobacco: Never  Substance and Sexual Activity   Alcohol use: No   Drug use: No   Sexual activity: Yes  Other Topics Concern   Not on file  Social History Narrative   wears sunscreen, brushes and flosses daily, see's dentist bi-annually, has smoke/carbon monoxide detectors, wears a seatbelt and practices gun safety   Social Determinants of Health   Financial Resource Strain: Medium Risk (02/02/2022)   Overall Financial Resource Strain (CARDIA)    Difficulty of Paying Living Expenses: Somewhat hard  Food Insecurity: Food Insecurity Present (02/07/2022)   Hunger Vital Sign    Worried About Running Out of Food in the Last Year: Sometimes true    Ran Out of Food in the Last Year: Sometimes true  Transportation Needs: No Transportation Needs (02/07/2022)   PRAPARE - Administrator, Civil Service (Medical): No    Lack of  Transportation (Non-Medical): No  Recent Concern: Transportation Needs - Unmet Transportation Needs (01/19/2022)   PRAPARE - Transportation    Lack of Transportation (Medical): Yes    Lack of Transportation (Non-Medical): Yes  Physical Activity: Inactive (06/21/2022)   Exercise Vital Sign    Days of Exercise per Week: 0 days    Minutes of Exercise per Session: 0 min  Stress: Stress Concern Present (06/21/2022)   [employer] of Occupational Health - Occupational Stress Questionnaire    Feeling of Stress : Rather much  Social Connections: Moderately Isolated (02/02/2022)   Social Connection and Isolation Panel [NHANES]    Frequency of Communication with Friends and Family: More than three times a week    Frequency of Social Gatherings with Friends and Family: Once a week    Attends Religious Services: 1 to 4 times per year    Active Member of [employer] or Organizations: No    Attends Banker Meetings: Never    Marital Status: Divorced  Catering manager Violence: Not At Risk (02/02/2022)   Humiliation, Afraid, Rape, and Kick questionnaire    Fear of Current or Ex-Partner: No    Emotionally Abused: No    Physically Abused: No    Sexually Abused: No    Outpatient Medications Prior to Visit  Medication Sig Dispense Refill   acetaminophen (TYLENOL) 500 MG tablet Take 500 mg by mouth every 6 (six) hours as needed for moderate pain or mild pain.     albuterol (VENTOLIN HFA) 108 (90 Base) MCG/ACT inhaler Inhale 2 puffs into the lungs every 6 (six) hours as needed for wheezing or shortness of breath. 8 g 1   atorvastatin (LIPITOR) 10 MG tablet TAKE ONE TABLET BY MOUTH EVERY EVENING 90 tablet 1   Bilberry, Vaccinium myrtillus, (BILBERRY PO) Take 450 mg by mouth 2 (two) times daily.     Blood Glucose Monitoring Suppl (ONETOUCH VERIO REFLECT) w/Device KIT AS DIRECTED     Blood Pressure Monitoring (SPHYGMOMANOMETER) MISC 1 each by Does not apply route daily in the afternoon. 1 each 0    buPROPion (WELLBUTRIN XL) 300 MG 24 hr tablet TAKE ONE TABLET BY MOUTH EVERY MORNING 90 tablet 3   busPIRone (BUSPAR) 5 MG tablet TAKE ONE TABLET BY MOUTH THREE TIMES DAILY 90 tablet 1   cariprazine (VRAYLAR) 3 MG capsule Take 1 capsule (3 mg total) by mouth daily. 90 capsule 0   clotrimazole-betamethasone (LOTRISONE) cream Apply twice daily as needed to area of concern 45 g 2   Continuous Blood Gluc Receiver (FREESTYLE LIBRE 2 READER) DEVI E11.69 Check blood sugar  4 times daily as directed 1 each 0   Continuous Blood Gluc Sensor (FREESTYLE LIBRE 2 SENSOR) MISC USE TO check blood glucose AS DIRECTED AND CHANGE sensor every 14 DAYS 6 each 3   cyclobenzaprine (FLEXERIL) 10 MG tablet TAKE ONE TABLET BY MOUTH every EIGHT hours AS NEEDED FOR MUSCLE SPASMS 270 tablet 1   dicyclomine (BENTYL) 20 MG tablet TAKE ONE TABLET BY MOUTH BEFORE MEALS AND AT BEDTIME AS NEEDED FOR STOMACH CRAMPING 180 tablet 1   ferrous sulfate 325 (65 FE) MG tablet Take 325 mg by mouth every evening.     FETZIMA 80 MG CP24 TAKE ONE CAPSULE BY MOUTH EVERY EVENING 90 capsule 1   gabapentin (NEURONTIN) 400 MG capsule Take 2 capsules (800 mg total) by mouth 4 (four) times daily. 180 capsule 1   insulin degludec (TRESIBA FLEXTOUCH) 200 UNIT/ML FlexTouch Pen Inject 60 Units into the skin daily. 9 mL 3   Insulin Pen Needle (BD PEN NEEDLE NANO U/F) 32G X 4 MM MISC      Lancets (ONETOUCH DELICA PLUS LANCET30G) MISC USE TO check blood glucose 2-3 times daily AS DIRECTED 100 each 3   levothyroxine (SYNTHROID) 75 MCG tablet TAKE ONE TABLET BY MOUTH BEFORE BREAKFAST 90 tablet 3   losartan (COZAAR) 50 MG tablet Take 1 tablet (50 mg total) by mouth daily. 90 tablet 0   LYCOPENE PO Take by mouth.     Magnesium 500 MG CAPS Take 500 mg by mouth daily.     Multiple Vitamin (MULTIVITAMIN WITH MINERALS) TABS tablet Take 1 tablet by mouth daily. Solar ray     omega-3 acid ethyl esters (LOVAZA) 1 g capsule TAKE TWO CAPSULES BY MOUTH TWICE DAILY 360  capsule 1   ondansetron (ZOFRAN) 4 MG tablet Take 1 tablet (4 mg total) by mouth every 4 (four) hours as needed for nausea. 90 tablet 3   OVER THE COUNTER MEDICATION Take 1 tablet by mouth in the morning and at bedtime. Lutein for eyes     pantoprazole (PROTONIX) 40 MG tablet Take 1 tablet (40 mg total) by mouth 2 (two) times daily. 180 tablet 1   polyethylene glycol (MIRALAX / GLYCOLAX) 17 g packet Take by mouth.     potassium chloride (MICRO-K) 10 MEQ CR capsule Take 2 capsules (20 mEq total) by mouth 2 (two) times daily. 360 capsule 3   Probiotic Product (PROBIOTIC PO) Take 1 capsule by mouth in the morning. 3.2 billion cfu     prochlorperazine (COMPAZINE) 5 MG tablet TAKE ONE TABLET BY MOUTH every SIX hours AS NEEDED FOR NAUSEA AND VOMITING 30 tablet 1   ramelteon (ROZEREM) 8 MG tablet Take 1 tablet (8 mg total) by mouth at bedtime. 90 tablet 1   rOPINIRole (REQUIP) 0.25 MG tablet TAKE ONE TABLET BY MOUTH EVERYDAY AT BEDTIME 90 tablet 1   Sod Fluoride-Potassium Nitrate (SODIUM FLUORIDE 5000 SENSITIVE) 1.1-5 % GEL BRUSH ONE bead gently ON teeth FOR TWO MINUTES TWICE DAILY     SYNJARDY 12.05-998 MG TABS TAKE ONE TABLET BY MOUTH TWICE DAILY 180 tablet 1   tirzepatide (MOUNJARO) 7.5 MG/0.5ML Pen Inject 7.5 mg into the skin once a week. 6 mL 0   Vitamin D, Ergocalciferol, (DRISDOL) 1.25 MG (50000 UNIT) CAPS capsule Take one capsule by mouth weekly 12 capsule 1   doxycycline (VIBRA-TABS) 100 MG tablet Take 1 tablet (100 mg total) by mouth 2 (two) times daily. 20 tablet 0   morphine (MS CONTIN) 30 MG 12 hr tablet  TAKE ONE TABLET BY MOUTH every 12 hours 60 tablet 0   VYVANSE 70 MG capsule TAKE ONE CAPSULE BY MOUTH EVERY MORNING 30 capsule 0   zolpidem (AMBIEN) 10 MG tablet TAKE ONE TABLET BY MOUTH EVERYDAY AT BEDTIME 30 tablet 5   Facility-Administered Medications Prior to Visit  Medication Dose Route Frequency Provider Last Rate Last Admin   sodium chloride flush (NS) 0.9 % injection 10 mL  10 mL  Intracatheter PRN Mosher, Kelli A, PA-C   10 mL at 02/19/21 1004    Allergies  Allergen Reactions   Codeine Shortness Of Breath and Other (See Comments)    Other reaction(s): SHOB  ask   Augmentin [Amoxicillin-Pot Clavulanate] Diarrhea   Celecoxib Other (See Comments)    Unknown reaction  Other reaction(s): Unknown  ask   Ezetimibe Other (See Comments)    Other reaction(s): Unknown   Ezetimibe-Simvastatin Other (See Comments)    Unknown reaction  Patient is not aware of an allergy to this medication, ask   Propranolol Other (See Comments)    Other reaction(s): Unknown   Propranolol Hcl Other (See Comments)    Unknown reaction  ask   Simvastatin Other (See Comments)    Other reaction(s): Unknown    Review of Systems  Constitutional:  Positive for fatigue. Negative for chills and fever.  HENT:  Negative for congestion.   Respiratory:  Positive for cough. Negative for shortness of breath.   Cardiovascular:  Negative for chest pain.  Gastrointestinal:  Negative for abdominal pain.  Genitourinary:  Negative for dysuria and urgency.  Musculoskeletal:  Negative for back pain and myalgias.  Skin:  Positive for rash (both axilla).  Neurological:  Positive for numbness (right foot). Negative for dizziness, weakness, light-headedness and headaches.       Balance issues.   Psychiatric/Behavioral:  Negative for dysphoric mood. The patient is not nervous/anxious.        Objective:        06/22/2022   10:06 AM 05/25/2022    7:34 AM 04/21/2022    9:31 AM  Vitals with BMI  Height 5\' 2"  5\' 2"  5\' 2"   Weight 203 lbs 206 lbs 211 lbs 6 oz  BMI 37.12 37.67 38.66  Systolic 132 110 161  Diastolic 70 56 56  Pulse 100 100 82    No data found.   Physical Exam Constitutional:      Appearance: Normal appearance.  Cardiovascular:     Rate and Rhythm: Normal rate and regular rhythm.     Pulses: Normal pulses.     Heart sounds: Normal heart sounds.  Pulmonary:     Effort:  Pulmonary effort is normal.     Breath sounds: Normal breath sounds.  Skin:    Findings: No rash.     Comments: Abrasions under axilla. Mild.   Neurological:     Mental Status: She is alert.   Foot exam from podiatry: Amputation site of the first and second toe on the right foot and first through third toe on the left foot. no open wounds, no erythema, drainage, or edema.  No infection. Dorsalis Pedis artery and Posterior Tibial artery pedal pulses are 2/4 bilateral.  Capillary fill time < 3 sec to all digits.  Grossly diminished via light touch protective sensation is absent.  Burning swelling pain in the right lateral midfoot.   Musculoskeletal: Attention to the right foot there is been partial amputation of the right hallux as well as amputation of the MPJ level of  the second toe.  On the left foot there is amputation at the MPJ level of the left hallux and partial amputation of the second and third toe.  Hammertoe present of the left foot toe.  Pes planus foot deformity    Health Maintenance Due  Topic Date Due   Zoster Vaccines- Shingrix (1 of 2) Never done   Medicare Annual Wellness (AWV)  04/23/2021   COVID-19 Vaccine (6 - 2023-24 season) 09/24/2021   OPHTHALMOLOGY EXAM  05/28/2022    There are no preventive care reminders to display for this patient.   Lab Results  Component Value Date   TSH 3.150 05/25/2022   Lab Results  Component Value Date   WBC 6.3 05/25/2022   HGB 11.6 05/25/2022   HCT 37.9 05/25/2022   MCV 86 05/25/2022   PLT 107 (L) 05/25/2022   Lab Results  Component Value Date   NA 142 05/25/2022   K 4.5 05/25/2022   CO2 21 05/25/2022   GLUCOSE 97 05/25/2022   BUN 19 05/25/2022   CREATININE 1.18 (H) 05/25/2022   BILITOT 0.6 05/25/2022   ALKPHOS 112 05/25/2022   AST 21 05/25/2022   ALT 25 05/25/2022   PROT 6.7 05/25/2022   ALBUMIN 4.1 05/25/2022   CALCIUM 9.0 05/25/2022   ANIONGAP 9 11/27/2019   EGFR 55 (L) 05/25/2022   Lab Results  Component  Value Date   CHOL 148 05/25/2022   Lab Results  Component Value Date   HDL 37 (L) 05/25/2022   Lab Results  Component Value Date   LDLCALC 84 05/25/2022   Lab Results  Component Value Date   TRIG 155 (H) 05/25/2022   Lab Results  Component Value Date   CHOLHDL 4.0 05/25/2022   Lab Results  Component Value Date   HGBA1C 6.9 (H) 05/25/2022       Assessment & Plan:  Chronic cough Assessment & Plan: Chest xray ordered.    Orders: -     DG Chest 2 View; Future  Type 2 diabetes mellitus with diabetic neuropathy, with long-term current use of insulin (HCC) Assessment & Plan: Begin elavil 25 mg daily.  Continue gabapentin at current dose.    Orders: -     Amitriptyline HCl; Take 1 tablet (25 mg total) by mouth at bedtime.  Dispense: 30 tablet; Refill: 2  Rash and nonspecific skin eruption Assessment & Plan: Vaseline or antibiotic ointment to be applied twice daily.        Meds ordered this encounter  Medications   amitriptyline (ELAVIL) 25 MG tablet    Sig: Take 1 tablet (25 mg total) by mouth at bedtime.    Dispense:  30 tablet    Refill:  2    Orders Placed This Encounter  Procedures   DG Chest 2 View     Follow-up: No follow-ups on file.  An After Visit Summary was printed and given to the patient.  [name], MD [facility] [phone]

## 2022-06-23 ENCOUNTER — Encounter: Payer: Self-pay | Admitting: Family Medicine

## 2022-06-24 ENCOUNTER — Encounter: Payer: Self-pay | Admitting: Family Medicine

## 2022-06-24 DIAGNOSIS — I10 Essential (primary) hypertension: Secondary | ICD-10-CM | POA: Diagnosis not present

## 2022-06-24 DIAGNOSIS — F32A Depression, unspecified: Secondary | ICD-10-CM

## 2022-06-24 DIAGNOSIS — E785 Hyperlipidemia, unspecified: Secondary | ICD-10-CM

## 2022-06-24 DIAGNOSIS — Z794 Long term (current) use of insulin: Secondary | ICD-10-CM

## 2022-06-24 DIAGNOSIS — E1159 Type 2 diabetes mellitus with other circulatory complications: Secondary | ICD-10-CM | POA: Diagnosis not present

## 2022-06-27 ENCOUNTER — Ambulatory Visit (INDEPENDENT_AMBULATORY_CARE_PROVIDER_SITE_OTHER): Payer: PPO | Admitting: Podiatry

## 2022-06-27 DIAGNOSIS — M2142 Flat foot [pes planus] (acquired), left foot: Secondary | ICD-10-CM

## 2022-06-27 DIAGNOSIS — E1142 Type 2 diabetes mellitus with diabetic polyneuropathy: Secondary | ICD-10-CM

## 2022-06-27 DIAGNOSIS — M2141 Flat foot [pes planus] (acquired), right foot: Secondary | ICD-10-CM

## 2022-06-27 DIAGNOSIS — L603 Nail dystrophy: Secondary | ICD-10-CM

## 2022-06-27 DIAGNOSIS — M792 Neuralgia and neuritis, unspecified: Secondary | ICD-10-CM

## 2022-06-27 DIAGNOSIS — Z89412 Acquired absence of left great toe: Secondary | ICD-10-CM | POA: Diagnosis not present

## 2022-06-27 DIAGNOSIS — B351 Tinea unguium: Secondary | ICD-10-CM

## 2022-06-27 DIAGNOSIS — Z89411 Acquired absence of right great toe: Secondary | ICD-10-CM | POA: Diagnosis not present

## 2022-06-27 NOTE — Progress Notes (Signed)
Subjective:  Patient ID: Linda [name], female    DOB: April 15, 1970,  MRN: [id number]  Chief Complaint  Patient presents with   Nail Problem    Routine Foot Care    Follow-up    f/u pain in right midfoot. Patient is still having pain to right foot. Patient was seen by PCP and they prescribed a nerve medication. Patient started that medication last night.     52 y.o. female presents for follow-up for pain in the right foot especially the outside of the foot.  She states that after wearing some compression stockings she has been doing better.  She is also started a new nerve medication amitriptyline.  She has decreased pain in both feet and especially in the right foot is improved from prior she is now able to walk without too much issue.  She was also concerned about some discoloration in the fourth toe on the left foot.  Think she may have stubbed the toe.  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 No longer has    Allergies  Allergen Reactions   Codeine Shortness Of Breath and Other (See Comments)    Other reaction(s): SHOB  ask   Augmentin [Amoxicillin-Pot Clavulanate] Diarrhea   Celecoxib Other (See Comments)    Unknown reaction  Other reaction(s): Unknown  ask   Ezetimibe Other (See Comments)    Other reaction(s): Unknown   Ezetimibe-Simvastatin Other (See Comments)    Unknown reaction  Patient is not aware of an allergy to this medication, ask   Propranolol Other (See Comments)    Other reaction(s): Unknown   Propranolol Hcl Other (See Comments)    Unknown reaction  ask   Simvastatin Other (See Comments)    Other reaction(s): Unknown    ROS: Negative except as per HPI above  Objective:  General: AAO x3, NAD  Dermatological: Amputation site of the first and second toe on the right foot and first through third toe on the left foot are doing very well with no open wounds no erythema drainage or edema.  No evidence of infection.  Venous varicosities noted on the right foot.  There is mild edema noted in the right foot.  Attention directed the fourth toenail on the left foot there is noted to  be some dried blood underneath the nail and there is dystrophy of the nail however no open wound is present.  The remaining nails on the right foot as well as on the left are onychomycotic dystrophic and elongated.  Vascular:  Dorsalis Pedis artery and Posterior Tibial artery pedal pulses are 2/4 bilateral.  Capillary fill time < 3 sec to all digits.   Neruologic: Grossly diminished via light touch protective sensation is absent.  Subjective sensation of burning swelling pain in the right lateral midfoot.  Musculoskeletal: Attention to the right foot there is been partial amputation of the right hallux as well as amputation of the MPJ level of the second toe.  On the left foot there is amputation at the MPJ level of the  left hallux and partial amputation of the second and third toe.  Hammertoe present of the left foot toe.  Pes planus foot deformity  Gait: Unassisted, Nonantalgic.   No images are attached to the encounter.  Radiographs:  Deferred Assessment:   1. 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 Neuropathic pain   3. Pain due to onychomycosis of toenails of both feet   4. Nail dystrophy   5. Pes planus of both feet   6. DM type 2 with diabetic peripheral neuropathy (HCC)       Plan:  Patient was evaluated and treated and all questions answered.  # Right midfoot arthritis versus neuropathic pain with diabetes with peripheral neuropathy -Improving -Continue with medication management including gabapentin and amitriptyline  # Left fourth toe nail discoloration -Likely due to trauma no evidence of wound or infection in the fourth toe on the left foot -Continue to monitor I trimmed the nail back and remove callus and it was healthier looking  # Pain due to onychomycosis of nails -Nails palliatively debrided as below. -Educated on self-care  Procedure: Nail Debridement Rationale: Pain Type of Debridement: manual, sharp debridement. Instrumentation: Nail nipper, rotary burr. Number of Nails: 5        Corinna Gab, DPM [facility] / [facility]

## 2022-06-30 ENCOUNTER — Telehealth: Payer: Self-pay | Admitting: Family Medicine

## 2022-06-30 DIAGNOSIS — Z0289 Encounter for other administrative examinations: Secondary | ICD-10-CM

## 2022-06-30 NOTE — Telephone Encounter (Signed)
FORMS FOR REASONABLE ACCOMMODATION/MODIFICATION VERIFICATION FORM FOR APARTMENT LIVING  -- PT SIGN FORMS THAT MAY COST SHEET - PT IS AWARE THAT IT MAYBE A FEE FOR FORMS TO BE FILLED OUT

## 2022-07-04 NOTE — Telephone Encounter (Signed)
Left vm for pt to come pick up forms - charge is in the system

## 2022-07-12 ENCOUNTER — Other Ambulatory Visit: Payer: Self-pay | Admitting: Family Medicine

## 2022-07-12 DIAGNOSIS — J209 Acute bronchitis, unspecified: Secondary | ICD-10-CM

## 2022-07-12 NOTE — Telephone Encounter (Signed)
Not sure if mounjaro is staying at same dose.

## 2022-07-13 ENCOUNTER — Other Ambulatory Visit: Payer: Self-pay | Admitting: Family Medicine

## 2022-07-13 DIAGNOSIS — F5081 Binge eating disorder: Secondary | ICD-10-CM

## 2022-07-20 ENCOUNTER — Ambulatory Visit: Payer: PPO

## 2022-07-20 DIAGNOSIS — L97522 Non-pressure chronic ulcer of other part of left foot with fat layer exposed: Secondary | ICD-10-CM

## 2022-07-20 DIAGNOSIS — L03031 Cellulitis of right toe: Secondary | ICD-10-CM | POA: Diagnosis not present

## 2022-07-20 DIAGNOSIS — E1142 Type 2 diabetes mellitus with diabetic polyneuropathy: Secondary | ICD-10-CM | POA: Diagnosis not present

## 2022-07-20 DIAGNOSIS — Z89411 Acquired absence of right great toe: Secondary | ICD-10-CM

## 2022-07-20 NOTE — Progress Notes (Unsigned)
Patient presents today to pick up diabetic shoes and insoles.  Patient was dispensed 1 pair of diabetic shoes and 3 pairs of foam casted diabetic insoles. Fit was satisfactory. Instructions for break-in and wear was reviewed and a copy was given to the patient.   Re-appointment for regularly scheduled diabetic foot care visits or if they should experience any trouble with the shoes or insoles.  

## 2022-07-26 ENCOUNTER — Telehealth: Payer: Self-pay | Admitting: Podiatry

## 2022-07-26 NOTE — Telephone Encounter (Signed)
Received [employer] auth for Diabetic shoes and inserts ([id number] x2/ [id number] X6) valid 6.14.2024 thru 9.12.2024 auth # [id number]

## 2022-07-27 ENCOUNTER — Ambulatory Visit (INDEPENDENT_AMBULATORY_CARE_PROVIDER_SITE_OTHER): Payer: PPO

## 2022-07-27 ENCOUNTER — Telehealth: Payer: PPO

## 2022-07-27 DIAGNOSIS — I152 Hypertension secondary to endocrine disorders: Secondary | ICD-10-CM

## 2022-07-27 DIAGNOSIS — E782 Mixed hyperlipidemia: Secondary | ICD-10-CM

## 2022-07-27 DIAGNOSIS — E1140 Type 2 diabetes mellitus with diabetic neuropathy, unspecified: Secondary | ICD-10-CM

## 2022-07-27 DIAGNOSIS — F332 Major depressive disorder, recurrent severe without psychotic features: Secondary | ICD-10-CM

## 2022-07-27 DIAGNOSIS — F330 Major depressive disorder, recurrent, mild: Secondary | ICD-10-CM

## 2022-07-27 DIAGNOSIS — E1165 Type 2 diabetes mellitus with hyperglycemia: Secondary | ICD-10-CM

## 2022-07-27 NOTE — Chronic Care Management (AMB) (Signed)
Chronic Care Management   CCM RN Visit Note  07/27/2022 Name: Linda [name] MRN: [id number] DOB: 01/15/1971  Subjective: Linda [name] is a 52 y.o. year old female who is a primary care patient of [name], Kirsten, MD. The patient was referred to the Chronic Care Management team for assistance with care management needs subsequent to provider initiation of CCM services and plan of care.    Today's Visit:  Engaged with patient by telephone for follow up visit.        Goals Addressed             This Visit's Progress    CCM Expected Outcome:  Monitor, Self-Manage and Reduce Symptoms of Diabetes       Current Barriers:  Knowledge Deficits related to concerns about places on her toes and not being able to be seen by podiatrist until next week, the patient expressed concern over being diabetic and not wanting to lose her toes Care Coordination needs related to resources in the area to help with her expressed needs and caring for her self due to lack of support system in a patient with DM Chronic Disease Management support and education needs related to effective management of DM Lacks caregiver support.  Lab Results  Component Value Date   HGBA1C 6.9 (H) 05/25/2022     Planned Interventions: Provided education to patient about basic DM disease process. The patient states that her blood sugars have been up and down. The patient is under goal with her A1c states she is doing well with DM management. Reviewed medications with patient and discussed importance of medication adherence. The patient is compliant with her medications. Denies any acute changes in her medications. ;        Reviewed prescribed diet with patient heart healthy/ADA diet. Is compliant with her diet; Counseled on importance of regular laboratory monitoring as prescribed. Has regular labwork ;        Discussed plans with patient for ongoing care management follow up and provided patient with direct contact  information for care management team;      Provided patient with written educational materials related to hypo and hyperglycemia and importance of correct treatment. The lowest the patient has seen is 100 and the highest is 200. ;       Reviewed scheduled/upcoming provider appointments including: 09-14-2022 at 8 am with the pcp;         Advised patient, providing education and rationale, to check cbg when you have symptoms of low or high blood sugar and has a continuous glucose reader, freestyle [place]  and record. The patient states that her blood sugars have been up and down. Range is usually around 100-200.  Sent a link for the patient to purchase covers from [place] that is an extra protectant for the freestyle libre as she has had issues with knocking the sensors off.    call provider for findings outside established parameters;       Referral made to pharmacy team for assistance with ongoing support and education for medication needs ;       Referral made to social work team for assistance with ongoing support and education for effective management of stress, anxiety, depression ;      Referral made to community resources care guide team for assistance with food resources and community resources;      Review of patient status, including review of consultants reports, relevant laboratory and other test results, and medications completed;  Advised patient to discuss changes in her DM and questions and concerns with provider;      Screening for signs and symptoms of depression related to chronic disease state;        Assessed social determinant of health barriers;        The patient states she is having more pain in her legs and feet and it is sometimes unbearable. The patient states that she has had a change in her medications but cannot tell that it has helped. Ask for recommendations. Was fitted yesterday for therapeutic shoes. Will send an in basket message to the pcp asking for recommendations.  Since seeing the pcp and starting on amitriptyline the patient is having less neuropathy pain. She is thankful for this. She also has diabetic shoes that are new. She is going to see the podiatrist on Monday as she is concerned about some spots she has on her feet since wearing the shoes. Education provided. The patient is being proactive in her care. Review of things to monitor for.   Symptom Management: Take medications as prescribed   Attend all scheduled provider appointments Call provider office for new concerns or questions  call the Suicide and Crisis Lifeline: 988 call the [place] National Suicide Prevention Lifeline: (587)594-7889 or TTY: (678)683-0735 TTY (218)137-8897) to talk to a trained counselor call 1-800-273-TALK (toll free, 24 hour hotline) if experiencing a Mental Health or Behavioral Health Crisis  keep appointment with eye doctor check feet daily for cuts, sores or redness trim toenails straight across manage portion size wash and dry feet carefully every day wear comfortable, cotton socks wear comfortable, well-fitting shoes  Follow Up Plan: Telephone follow up appointment with care management team member scheduled for: 09-22-2022 at 1145 am       CCM Expected Outcome:  Monitor, Self-Manage and Reduce Symptoms of HLD       Current Barriers:  Chronic Disease Management support and education needs related to effective management of HLD Lab Results  Component Value Date   CHOL 148 05/25/2022   HDL 37 (L) 05/25/2022   LDLCALC 84 05/25/2022   TRIG 155 (H) 05/25/2022   CHOLHDL 4.0 05/25/2022     Planned Interventions: Provider established cholesterol goals reviewed. Review of labs. The patient is above goal on her triglycerides. Education and support given. ; Counseled on importance of regular laboratory monitoring as prescribed; Provided HLD educational materials; Reviewed role and benefits of statin for ASCVD risk reduction. States compliance with  medications; Discussed strategies to manage statin-induced myalgias. The patient takes Lipitor 10 mg QD; Reviewed importance of limiting foods high in cholesterol. Education and support provided on a heart healthy/ADA diet. The patient tries to watch what she is eating. Is trying to eat more vegetables. In the past she has not liked vegetables ; Screening for signs and symptoms of depression related to chronic disease state;  Assessed social determinant of health barriers;   Symptom Management: Take medications as prescribed   Attend all scheduled provider appointments Call provider office for new concerns or questions  call the Suicide and Crisis Lifeline: 988 call the [place] National Suicide Prevention Lifeline: (203)492-8466 or TTY: (202) 209-6004 TTY 765-406-2890) to talk to a trained counselor call 1-800-273-TALK (toll free, 24 hour hotline) if experiencing a Mental Health or Behavioral Health Crisis  - take all medications exactly as prescribed - call doctor with any symptoms you believe are related to your medicine - call doctor when you experience any new symptoms - go to all  doctor appointments as scheduled - adhere to prescribed diet: heart healthy/ADA diet   Follow Up Plan: Telephone follow up appointment with care management team member scheduled for: 09-22-2022 at 1145 am       CCM Expected Outcome:  Monitor, Self-Manage and Reduce Symptoms of: Depression       Current Barriers:  Knowledge Deficits related to how to manage emotions and stress level in a patient with multiple chronic conditions including depression Care Coordination needs related to resources in the community for food insecurities and additional support for social worker support  in a patient with depression and increased stress level Chronic Disease Management support and education needs related to effective management of depression  Lacks caregiver support.   Planned Interventions: Evaluation of current  treatment plan related to depression  and patient's adherence to plan as established by provider. The patient states she has good days and bad days. She states that some days she feels like she can do anything but her health limits her in what she can do. Some days she does not feel like getting up out of the bed and sometimes that is because of the pain she has.She feels like she is stable at this time. She does know she has the option to see a psychiatrist. She says a lot of her issues stem from her husband leaving her after 18 years of marriage. Reflective listening and support given.  Advised patient to call the office for changes in mood, anxiety, depression, stress level, or mental health needs  Provided education to patient re: doing mindfulness activities, journal writing, creative ways to help her when she is stressed out. The patient is doing much better and her feet are healing. She states that she is having good days and some bad days. Overall she is in good spirits and is thankful for the support of her providers. She sees providers on a regular basis.  Reviewed medications with patient and discussed compliance. States she is compliant with medications Collaborated with LCSW and scheduling care guides regarding the need for follow up by LCSW. The LCSW has worked with the patient before for depression and other needs Provided patient with resources and support for effective management of depression  educational materials related to managing her stress level and depression  Reviewed scheduled/upcoming provider appointments including 09-14-2022 at 8 am Care Guide referral for food insecurities and scheduling follow up with LCSW Social Work referral for support and education needs related to depression and stress. Ongoing support and education from the LCSW.  Discussed plans with patient for ongoing care management follow up and provided patient with direct contact information for care management  team Advised patient to discuss changes in her depression and anxiety level, questions and concerns on how to best manage her chronic care needs due to limited support system with provider Screening for signs and symptoms of depression related to chronic disease state  Assessed social determinant of health barriers  Symptom Management: Take medications as prescribed   Attend all scheduled provider appointments Call provider office for new concerns or questions  call the Suicide and Crisis Lifeline: 988 call the [place] National Suicide Prevention Lifeline: 430-694-2265 or TTY: 409 223 8701 TTY 514-342-7803) to talk to a trained counselor call 1-800-273-TALK (toll free, 24 hour hotline) if experiencing a Mental Health or Behavioral Health Crisis   Follow Up Plan: Telephone follow up appointment with care management team member scheduled for: 09-22-2022 at 1145 am       CCM Expected  Outcome:  Monitor, Self-Manage, and Reduce Symptoms of Hypertension       Current Barriers:  Chronic Disease Management support and education needs related to effective management of HTN Lacks caregiver support.   BP Readings from Last 3 Encounters:  06/22/22 132/70  05/25/22 (!) 110/56  04/21/22 (!) 112/56    Planned Interventions: Evaluation of current treatment plan related to hypertension self management and patient's adherence to plan as established by provider. The patients blood pressures are a little on the low side. She denies any light headedness or dizziness. Has had adjustments in her medications. Blood pressures are more stable. The patient knows to call the provider for changes. Education provided to call the provider for acute changes in blood pressures   Provided education to patient re: stroke prevention, s/s of heart attack and stroke; Reviewed prescribed diet heart  healthy/ADA diet. The patient is monitoring her dietary intake. Reviewed medications with patient and discussed importance of  compliance. The patient states compliance with medications. Had recent changes in her blood pressure medications;  Discussed plans with patient for ongoing care management follow up and provided patient with direct contact information for care management team; Advised patient, providing education and rationale, to monitor blood pressure daily and record, calling PCP for findings outside established parameters;  Reviewed scheduled/upcoming provider appointments including: 09-14-2022 at 8 am Advised patient to discuss changes in blood pressures and heart health with provider; Provided education on prescribed diet heart healthy/ADA diet ;  Discussed complications of poorly controlled blood pressure such as heart disease, stroke, circulatory complications, vision complications, kidney impairment, sexual dysfunction;  Screening for signs and symptoms of depression related to chronic disease state;  Assessed social determinant of health barriers;   Symptom Management: Take medications as prescribed   Attend all scheduled provider appointments Call provider office for new concerns or questions  call the Suicide and Crisis Lifeline: 988 call the [place] National Suicide Prevention Lifeline: 603-650-0074 or TTY: 7134981053 TTY (803)576-9446) to talk to a trained counselor call 1-800-273-TALK (toll free, 24 hour hotline) if experiencing a Mental Health or Behavioral Health Crisis  check blood pressure weekly learn about high blood pressure call doctor for signs and symptoms of high blood pressure develop an action plan for high blood pressure keep all doctor appointments take medications for blood pressure exactly as prescribed report new symptoms to your doctor  Follow Up Plan: Telephone follow up appointment with care management team member scheduled for: 09-22-2022 at 1145 am          Plan:Telephone follow up appointment with care management team member scheduled for:  09-22-2022 at 1145 am    Alto Denver RN, MSN, CCM RN Care Manager  Chronic Care Management Direct Number: [phone]

## 2022-07-27 NOTE — Patient Instructions (Signed)
Please call the care guide team at [phone] if you need to cancel or reschedule your appointment.   If you are experiencing a Mental Health or Behavioral Health Crisis or need someone to talk to, please call the Suicide and Crisis Lifeline: 988 call the [place] National Suicide Prevention Lifeline: 236-302-6098 or TTY: 272-664-7489 TTY 657-639-5585) to talk to a trained counselor call 1-800-273-TALK (toll free, 24 hour hotline) go to [facility] [address], [place] [phone])   Following is a copy of the CCM Program Consent:  CCM service includes personalized support from designated clinical staff supervised by the physician, including individualized plan of care and coordination with other care providers 24/7 contact phone numbers for assistance for urgent and routine care needs. Service will only be billed when office clinical staff spend 20 minutes or more in a month to coordinate care. Only one practitioner may furnish and bill the service in a calendar month. The patient may stop CCM services at amy time (effective at the end of the month) by phone call to the office staff. The patient will be responsible for cost sharing (co-pay) or up to 20% of the service fee (after annual deductible is met)  Following is a copy of your full provider care plan:   Goals Addressed             This Visit's Progress    CCM Expected Outcome:  Monitor, Self-Manage and Reduce Symptoms of Diabetes       Current Barriers:  Knowledge Deficits related to concerns about places on her toes and not being able to be seen by podiatrist until next week, the patient expressed concern over being diabetic and not wanting to lose her toes Care Coordination needs related to resources in the area to help with her expressed needs and caring for her self due to lack of support system in a patient with DM Chronic Disease Management support and education needs related to  effective management of DM Lacks caregiver support.  Lab Results  Component Value Date   HGBA1C 6.9 (H) 05/25/2022     Planned Interventions: Provided education to patient about basic DM disease process. The patient states that her blood sugars have been up and down. The patient is under goal with her A1c states she is doing well with DM management. Reviewed medications with patient and discussed importance of medication adherence. The patient is compliant with her medications. Denies any acute changes in her medications. ;        Reviewed prescribed diet with patient heart healthy/ADA diet. Is compliant with her diet; Counseled on importance of regular laboratory monitoring as prescribed. Has regular labwork ;        Discussed plans with patient for ongoing care management follow up and provided patient with direct contact information for care management team;      Provided patient with written educational materials related to hypo and hyperglycemia and importance of correct treatment. The lowest the patient has seen is 100 and the highest is 200. ;       Reviewed scheduled/upcoming provider appointments including: 09-14-2022 at 8 am with the pcp;         Advised patient, providing education and rationale, to check cbg when you have symptoms of low or high blood sugar and has a continuous glucose reader, freestyle [place]  and record. The patient states that her blood sugars have been up and down. Range is usually around 100-200.  Sent a link  for the patient to purchase covers from [place] that is an extra protectant for the freestyle libre as she has had issues with knocking the sensors off.    call provider for findings outside established parameters;       Referral made to pharmacy team for assistance with ongoing support and education for medication needs ;       Referral made to social work team for assistance with ongoing support and education for effective management of stress, anxiety,  depression ;      Referral made to community resources care guide team for assistance with food resources and community resources;      Review of patient status, including review of consultants reports, relevant laboratory and other test results, and medications completed;       Advised patient to discuss changes in her DM and questions and concerns with provider;      Screening for signs and symptoms of depression related to chronic disease state;        Assessed social determinant of health barriers;        The patient states she is having more pain in her legs and feet and it is sometimes unbearable. The patient states that she has had a change in her medications but cannot tell that it has helped. Ask for recommendations. Was fitted yesterday for therapeutic shoes. Will send an in basket message to the pcp asking for recommendations. Since seeing the pcp and starting on amitriptyline the patient is having less neuropathy pain. She is thankful for this. She also has diabetic shoes that are new. She is going to see the podiatrist on Monday as she is concerned about some spots she has on her feet since wearing the shoes. Education provided. The patient is being proactive in her care. Review of things to monitor for.   Symptom Management: Take medications as prescribed   Attend all scheduled provider appointments Call provider office for new concerns or questions  call the Suicide and Crisis Lifeline: 988 call the [place] National Suicide Prevention Lifeline: 501-168-3182 or TTY: (531)699-8069 TTY (330)858-6814) to talk to a trained counselor call 1-800-273-TALK (toll free, 24 hour hotline) if experiencing a Mental Health or Behavioral Health Crisis  keep appointment with eye doctor check feet daily for cuts, sores or redness trim toenails straight across manage portion size wash and dry feet carefully every day wear comfortable, cotton socks wear comfortable, well-fitting shoes  Follow Up Plan:  Telephone follow up appointment with care management team member scheduled for: 09-22-2022 at 1145 am       CCM Expected Outcome:  Monitor, Self-Manage and Reduce Symptoms of HLD       Current Barriers:  Chronic Disease Management support and education needs related to effective management of HLD Lab Results  Component Value Date   CHOL 148 05/25/2022   HDL 37 (L) 05/25/2022   LDLCALC 84 05/25/2022   TRIG 155 (H) 05/25/2022   CHOLHDL 4.0 05/25/2022     Planned Interventions: Provider established cholesterol goals reviewed. Review of labs. The patient is above goal on her triglycerides. Education and support given. ; Counseled on importance of regular laboratory monitoring as prescribed; Provided HLD educational materials; Reviewed role and benefits of statin for ASCVD risk reduction. States compliance with medications; Discussed strategies to manage statin-induced myalgias. The patient takes Lipitor 10 mg QD; Reviewed importance of limiting foods high in cholesterol. Education and support provided on a heart healthy/ADA diet. The patient tries to watch what she  is eating. Is trying to eat more vegetables. In the past she has not liked vegetables ; Screening for signs and symptoms of depression related to chronic disease state;  Assessed social determinant of health barriers;   Symptom Management: Take medications as prescribed   Attend all scheduled provider appointments Call provider office for new concerns or questions  call the Suicide and Crisis Lifeline: 988 call the [place] National Suicide Prevention Lifeline: 979 412 3179 or TTY: 4798849160 TTY 475-393-3646) to talk to a trained counselor call 1-800-273-TALK (toll free, 24 hour hotline) if experiencing a Mental Health or Behavioral Health Crisis  - take all medications exactly as prescribed - call doctor with any symptoms you believe are related to your medicine - call doctor when you experience any new symptoms - go to  all doctor appointments as scheduled - adhere to prescribed diet: heart healthy/ADA diet   Follow Up Plan: Telephone follow up appointment with care management team member scheduled for: 09-22-2022 at 1145 am       CCM Expected Outcome:  Monitor, Self-Manage and Reduce Symptoms of: Depression       Current Barriers:  Knowledge Deficits related to how to manage emotions and stress level in a patient with multiple chronic conditions including depression Care Coordination needs related to resources in the community for food insecurities and additional support for social worker support  in a patient with depression and increased stress level Chronic Disease Management support and education needs related to effective management of depression  Lacks caregiver support.   Planned Interventions: Evaluation of current treatment plan related to depression  and patient's adherence to plan as established by provider. The patient states she has good days and bad days. She states that some days she feels like she can do anything but her health limits her in what she can do. Some days she does not feel like getting up out of the bed and sometimes that is because of the pain she has.She feels like she is stable at this time. She does know she has the option to see a psychiatrist. She says a lot of her issues stem from her husband leaving her after 18 years of marriage. Reflective listening and support given.  Advised patient to call the office for changes in mood, anxiety, depression, stress level, or mental health needs  Provided education to patient re: doing mindfulness activities, journal writing, creative ways to help her when she is stressed out. The patient is doing much better and her feet are healing. She states that she is having good days and some bad days. Overall she is in good spirits and is thankful for the support of her providers. She sees providers on a regular basis.  Reviewed medications with  patient and discussed compliance. States she is compliant with medications Collaborated with LCSW and scheduling care guides regarding the need for follow up by LCSW. The LCSW has worked with the patient before for depression and other needs Provided patient with resources and support for effective management of depression  educational materials related to managing her stress level and depression  Reviewed scheduled/upcoming provider appointments including 09-14-2022 at 8 am Care Guide referral for food insecurities and scheduling follow up with LCSW Social Work referral for support and education needs related to depression and stress. Ongoing support and education from the LCSW.  Discussed plans with patient for ongoing care management follow up and provided patient with direct contact information for care management team Advised patient to discuss changes in  her depression and anxiety level, questions and concerns on how to best manage her chronic care needs due to limited support system with provider Screening for signs and symptoms of depression related to chronic disease state  Assessed social determinant of health barriers  Symptom Management: Take medications as prescribed   Attend all scheduled provider appointments Call provider office for new concerns or questions  call the Suicide and Crisis Lifeline: 988 call the [place] National Suicide Prevention Lifeline: 361-114-9403 or TTY: 718-075-7933 TTY (949)319-8084) to talk to a trained counselor call 1-800-273-TALK (toll free, 24 hour hotline) if experiencing a Mental Health or Behavioral Health Crisis   Follow Up Plan: Telephone follow up appointment with care management team member scheduled for: 09-22-2022 at 1145 am       CCM Expected Outcome:  Monitor, Self-Manage, and Reduce Symptoms of Hypertension       Current Barriers:  Chronic Disease Management support and education needs related to effective management of HTN Lacks caregiver  support.   BP Readings from Last 3 Encounters:  06/22/22 132/70  05/25/22 (!) 110/56  04/21/22 (!) 112/56    Planned Interventions: Evaluation of current treatment plan related to hypertension self management and patient's adherence to plan as established by provider. The patients blood pressures are a little on the low side. She denies any light headedness or dizziness. Has had adjustments in her medications. Blood pressures are more stable. The patient knows to call the provider for changes. Education provided to call the provider for acute changes in blood pressures   Provided education to patient re: stroke prevention, s/s of heart attack and stroke; Reviewed prescribed diet heart  healthy/ADA diet. The patient is monitoring her dietary intake. Reviewed medications with patient and discussed importance of compliance. The patient states compliance with medications. Had recent changes in her blood pressure medications;  Discussed plans with patient for ongoing care management follow up and provided patient with direct contact information for care management team; Advised patient, providing education and rationale, to monitor blood pressure daily and record, calling PCP for findings outside established parameters;  Reviewed scheduled/upcoming provider appointments including: 09-14-2022 at 8 am Advised patient to discuss changes in blood pressures and heart health with provider; Provided education on prescribed diet heart healthy/ADA diet ;  Discussed complications of poorly controlled blood pressure such as heart disease, stroke, circulatory complications, vision complications, kidney impairment, sexual dysfunction;  Screening for signs and symptoms of depression related to chronic disease state;  Assessed social determinant of health barriers;   Symptom Management: Take medications as prescribed   Attend all scheduled provider appointments Call provider office for new concerns or questions   call the Suicide and Crisis Lifeline: 988 call the [place] National Suicide Prevention Lifeline: 409-595-8841 or TTY: 515-027-2756 TTY 315-203-4321) to talk to a trained counselor call 1-800-273-TALK (toll free, 24 hour hotline) if experiencing a Mental Health or Behavioral Health Crisis  check blood pressure weekly learn about high blood pressure call doctor for signs and symptoms of high blood pressure develop an action plan for high blood pressure keep all doctor appointments take medications for blood pressure exactly as prescribed report new symptoms to your doctor  Follow Up Plan: Telephone follow up appointment with care management team member scheduled for: 09-22-2022 at 1145 am          Patient verbalizes understanding of instructions and care plan provided today and agrees to view in MyChart. Active MyChart status and patient understanding of how to access instructions and  care plan via MyChart confirmed with patient.  Telephone follow up appointment with care management team member scheduled for: 09-22-2022 at 1145 am

## 2022-08-01 ENCOUNTER — Ambulatory Visit: Payer: PPO | Admitting: Podiatry

## 2022-08-01 DIAGNOSIS — Z89412 Acquired absence of left great toe: Secondary | ICD-10-CM

## 2022-08-01 DIAGNOSIS — M2142 Flat foot [pes planus] (acquired), left foot: Secondary | ICD-10-CM

## 2022-08-01 DIAGNOSIS — M2042 Other hammer toe(s) (acquired), left foot: Secondary | ICD-10-CM

## 2022-08-01 DIAGNOSIS — Z89411 Acquired absence of right great toe: Secondary | ICD-10-CM | POA: Diagnosis not present

## 2022-08-01 DIAGNOSIS — E1142 Type 2 diabetes mellitus with diabetic polyneuropathy: Secondary | ICD-10-CM | POA: Diagnosis not present

## 2022-08-01 DIAGNOSIS — M2141 Flat foot [pes planus] (acquired), right foot: Secondary | ICD-10-CM | POA: Diagnosis not present

## 2022-08-01 NOTE — Progress Notes (Signed)
Subjective:  Patient ID: Linda [name], female    DOB: [birth year],  MRN: [id number]  Chief Complaint  Patient presents with   Foot Problem    Redness to toes on bilateral feel after using the diabetic shoes. Patient started to use the shoes June 26 until June 30. Patient is diabetic and does not want to have another sore.    Foot Pain    Foot pain to the right foot lateral aspect of foot. Patient uses bedroom shoes majority of the time and feels like her foot is swelling. She started to wear compression socks.     52 y.o. female presents for follow-up for concern for possible spider bite to the left lateral foot.  She has been using diabetic shoes and says that they have been doing well but she does notice some redness in the toes.  She is going to get checked out to make sure there was no concern for new preulcerative callus or ulceration.  Patient does use compression socks for swelling.  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 No longer has    Allergies  Allergen Reactions   Codeine Shortness Of Breath and Other (See Comments)    Other reaction(s): SHOB  ask   Augmentin [Amoxicillin-Pot Clavulanate] Diarrhea   Celecoxib Other (See Comments)    Unknown reaction  Other reaction(s): Unknown  ask   Ezetimibe Other (See Comments)    Other reaction(s): Unknown   Ezetimibe-Simvastatin Other (See Comments)    Unknown reaction  Patient is not aware of an allergy to this medication, ask   Propranolol Other (See Comments)    Other reaction(s): Unknown   Propranolol Hcl Other (See Comments)    Unknown reaction  ask   Simvastatin Other (See Comments)    Other reaction(s): Unknown    ROS: Negative except as per HPI above  Objective:  General: AAO x3, NAD  Dermatological: Amputation site of the first and second toe on the right foot and first through third toe on the left foot are doing very well with no open wounds no erythema drainage or edema.  No evidence of infection.  Venous varicosities noted on the right foot.  No significant edema noted in either forefoot.  No open wounds no obvious spider bites.  Mild skin peeling from the left dorsal lateral foot.  Vascular:  Dorsalis Pedis artery and Posterior Tibial artery pedal pulses are 2/4 bilateral.  Capillary fill time < 3 sec to all digits.   Neruologic: Grossly diminished via light touch protective sensation is absent.  Subjective sensation of burning swelling pain in the right lateral midfoot.  Musculoskeletal: Attention to the right foot there is been partial amputation of the right hallux as well as amputation of the MPJ level of the second toe.  On the left foot there is amputation at the MPJ level of the left hallux and partial amputation of the second and third toe.  Hammertoe present of the left foot toe.  Pes planus foot deformity  Gait: Unassisted,  Nonantalgic.   No images are attached to the encounter.  Radiographs:  Deferred Assessment:   1. 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 Hammertoe of left foot   3. Pes planus of both feet   4. DM type 2 with diabetic peripheral neuropathy (HCC)     Plan:  Patient was evaluated and treated and all questions answered.   # Concern for spider bite/possible cellulitis left foot status post multiple toe amputations -Amputation sites on both feet are well-healed at this time there is no evidence of dehiscence or residual infection in any of the toes either amputated or the remaining toes of the left foot or right foot -No evidence of cellulitis or open wound or preulcerative sore on either foot -Recommend continued use of diabetic shoes and liners to prevent any type of pressure spots forming -Recommend compression stockings for edema control        Corinna Gab, DPM [facility] / [facility]

## 2022-08-04 ENCOUNTER — Other Ambulatory Visit: Payer: Self-pay

## 2022-08-05 ENCOUNTER — Encounter: Payer: Self-pay | Admitting: Family Medicine

## 2022-08-05 MED ORDER — GABAPENTIN 400 MG PO CAPS: 800.0000 mg | ORAL_CAPSULE | Freq: Three times a day (TID) | ORAL | 1 refills | Status: DC

## 2022-08-08 ENCOUNTER — Other Ambulatory Visit: Payer: Self-pay | Admitting: Family Medicine

## 2022-08-08 DIAGNOSIS — Z1501 Genetic susceptibility to malignant neoplasm of breast: Secondary | ICD-10-CM

## 2022-08-09 ENCOUNTER — Other Ambulatory Visit: Payer: Self-pay | Admitting: Family Medicine

## 2022-08-17 ENCOUNTER — Ambulatory Visit: Payer: Self-pay | Admitting: Licensed Clinical Social Worker

## 2022-08-17 ENCOUNTER — Other Ambulatory Visit: Payer: Self-pay | Admitting: Family Medicine

## 2022-08-17 DIAGNOSIS — F5081 Binge eating disorder: Secondary | ICD-10-CM

## 2022-08-17 NOTE — Patient Outreach (Signed)
  Care Coordination   Follow Up Visit Note   08/17/2022 Name: Linda [name] MRN: [id number] DOB: [birth year]  Linda [name] is a 52 y.o. year old female who sees [name], [name], MD for primary care. I spoke with  Linda [name] by phone today.  What matters to the patients health and wellness today?  Patient Stated she has stress related to managing health issues. She has pain in her feet occasionally (pt-stated)    Goals Addressed               This Visit's Progress     Patient Stated she has stress related to managing health issues. She has pain in her feet occasionally (pt-stated)        Interventions  Spoke with client via phone about client needs Spoke with Linda [name] about financial needs. Discussed setting up payment plans as able with medical providers. She said she has past bills due for medical care.  Linda [name] said she has pain in her feet, sometimes it is stinging pain .She spoke of back pain issues Discussed mood of client. She said she feels anxious occasionally. She has talked with PCP before about mental health support. Linda [name] has talked with client about psychiatric care (per information from [name]). Client takes medications as prescribed Reviewed program support for client  with RN, LCSW, Pharmacist Client said she has back pain issues.  She has difficulty walking longer distances. She uses rollator to help her walk Reviewed support with PCP, [name]. Discussed family support. Client has some support from a friend and from client's step-mother.  Reviewed medication procurement. Discussed transport needs of client.  She said she drives her car for short trips and errands. Reminded Megann about [facility] support. Kalayah has talked with [facility] about transport support Provided counseling support for client Encouraged client to call LCSW for SW support as needed at [phone]. Kalene was appreciative of phone conversation with LCSW today           SDOH assessments and interventions completed:  Yes  SDOH Interventions Today    Flowsheet Row Most Recent Value  SDOH Interventions   Depression Interventions/Treatment  Counseling  Physical Activity Interventions Other (Comments)  [some mobility issues. some walking challenges. uses rollator as needed]  Stress Interventions Provide Counseling  [has some stress in managing medical needs and daily needs]        Care Coordination Interventions:  Yes, provided    Interventions Today    Flowsheet Row Most Recent Value  Chronic Disease   Chronic disease during today's visit Other  [spoke with client about client needs]  General Interventions   General Interventions Discussed/Reviewed General Interventions Discussed, [employer] program support with RN, LCSW, Pharmacist]  Exercise Interventions   Exercise Discussed/Reviewed Physical Activity  [uses rollator as needed]  Physical Activity Discussed/Reviewed Physical Activity Discussed  Education Interventions   Education Provided Provided Education  Provided Verbal Education On Community Resources  Mental Health Interventions   Mental Health Discussed/Reviewed Coping Strategies, Anxiety  [discussed mood. she gets sad sometimes related to managing financial needs]  Nutrition Interventions   Nutrition Discussed/Reviewed Nutrition Discussed  Pharmacy Interventions   Pharmacy Dicussed/Reviewed Pharmacy Topics Discussed       Follow up plan: Follow up call scheduled for 10/04/22 at 1:30 PM     Encounter Outcome:  Pt. Visit Completed   Kelton Pillar.Romy Ipock MSW, LCSW Licensed Visual merchandiser [facility] Care Management [phone]

## 2022-08-17 NOTE — Patient Instructions (Signed)
Visit Information  Thank you for taking time to visit with me today. Please don't hesitate to contact me if I can be of assistance to you.   Following are the goals we discussed today:   Goals Addressed               This Visit's Progress     Patient Stated she has stress related to managing health issues. She has pain in her feet occasionally (pt-stated)        Interventions  Spoke with client via phone about client needs Spoke with Kamrie about financial needs. Discussed setting up payment plans as able with medical providers. She said she has past bills due for medical care.  Yuridiana said she has pain in her feet, sometimes it is stinging pain .She spoke of back pain issues Discussed mood of client. She said she feels anxious occasionally. She has talked with PCP before about mental health support. [name] has talked with client about psychiatric care (per information from [name]). Client takes medications as prescribed Reviewed program support for client  with RN, LCSW, Pharmacist Client said she has back pain issues.  She has difficulty walking longer distances. She uses rollator to help her walk Reviewed support with PCP, [name]. Discussed family support. Client has some support from a friend and from client's step-mother.  Reviewed medication procurement. Discussed transport needs of client.  She said she drives her car for short trips and errands. Reminded Suda about [facility] support. Casidee has talked with [facility] about transport support Provided counseling support for client Encouraged client to call LCSW for SW support as needed at [phone]. Mistina was appreciative of phone conversation with LCSW today          Our next appointment is by telephone on 10/04/22 at 1:30 PM   Please call the care guide team at [phone] if you need to cancel or reschedule your appointment.   If you are experiencing a Mental Health or Behavioral Health Crisis or need someone to  talk to, please go to [facility] [address] [place] [phone])   The patient verbalized understanding of instructions, educational materials, and care plan provided today and DECLINED offer to receive copy of patient instructions, educational materials, and care plan.   The patient has been provided with contact information for the care management team and has been advised to call with any health related questions or concerns.   Kelton Pillar.Ziah Leandro MSW, LCSW Licensed Visual merchandiser [facility] Care Management [phone]

## 2022-08-24 DIAGNOSIS — E785 Hyperlipidemia, unspecified: Secondary | ICD-10-CM

## 2022-08-24 DIAGNOSIS — E1159 Type 2 diabetes mellitus with other circulatory complications: Secondary | ICD-10-CM

## 2022-08-24 DIAGNOSIS — F32A Depression, unspecified: Secondary | ICD-10-CM | POA: Diagnosis not present

## 2022-08-24 DIAGNOSIS — I10 Essential (primary) hypertension: Secondary | ICD-10-CM

## 2022-08-24 DIAGNOSIS — Z794 Long term (current) use of insulin: Secondary | ICD-10-CM

## 2022-09-13 NOTE — Assessment & Plan Note (Deleted)
Control: not at goal. Recommend continue CGM. Recommend check feet daily. Recommend annual eye exams. Medicines: Mounjaro 7.5 mg once weekly, Synjardy 12.5 mg/ 1000 mg 1 tablet twice daily, Tresiba inject 60 units daily. Continue to work on eating a healthy diet and exercise.  Labs drawn today.

## 2022-09-13 NOTE — Assessment & Plan Note (Signed)
Well controlled.  No changes to medicines. Continue Losartan potassium 50 mg take 1 tablet daily, Potassium Chloride 10 meq take 2 capsules BID Continue to work on eating a healthy diet and exercise.  Labs drawn today.

## 2022-09-13 NOTE — Assessment & Plan Note (Signed)
Management per specialist. 

## 2022-09-13 NOTE — Assessment & Plan Note (Signed)
The current medical regimen is effective;  continue present plan and medications.  

## 2022-09-13 NOTE — Assessment & Plan Note (Signed)
Well controlled.  No changes to medicines. Taking Atorvastatin 10 mg 1 tablet daily, lovaza 1 gm 2 capsules twice daily. Continue to work on eating a healthy diet and exercise.  Labs drawn today.

## 2022-09-13 NOTE — Progress Notes (Unsigned)
Subjective:  Patient ID: Linda [name], female    DOB: Apr 22, 1970  Age: 52 y.o. MRN: [id number]  Chief Complaint  Patient presents with   Medical Management of Chronic Issues    HPI  Diabetes:  Complications: HYPERTENSION, CKD Glucose checking: CGM Glucose logs: 70-210 Hypoglycemia: rarely  Most recent A1C: 7.4 Current medications: Mounjaro 7.5mg  once weekly, Synjardy 12.5 mg/ 1000 mg 1 tablet twice daily, Tresiba inject 60 units daily. Last Eye Exam: scheduled for August 2024.  Foot checks: Daily  Hyperlipidemia: Current medications: Taking Atorvastatin 10 mg 1 tablet daily, lovaza 1 gm 2 capsules twice daily.   Hypertensive kidney disease with Stage 3B chronic kidney disease: Complications: DM Current medications: Losartan potassium 50 mg take 1 tablet daily, Potassium Chloride 10 meq take 2 capsules twice daily.  Acquired Hypothyroidism: Patient is currently taking Levothyroxine 75 mg daily.  Chronic Pain Syndrome: Chronic back pain has worsened. On Morphine 30 mg take 1 tablet twice daily, Gabapentin 300 mg take 2 capsules three times a day.   Binge eating disorder: Patient is currently taking Vyvanse 70 mg daily.  Vitamin D deficiency: Patient is currently taking Vitamin D 50,000 units 1 capsule weekly.  GERD:  Pantoprazole 40 mg 1 tablet TWICE DAILY in pill pack, Famotidine 20 mg 1 tablet twice daily .  Depression, mild, recurrent: Patient is currently taking Fetzima 80 mg daily, Bupropion 300 mg 1 tablet daily, Buspirone 5 mg take 1 tablet three times a day.   IBS D: has dicyclomine that uses as needed.   [facility]: Name: Linda [name] Date of Birth: 1970/06/25 Report Period: 09/01/2022 - 09/14/2022 (14 days) Generated: 09/14/2022 % Time CGM Active: 17%   Glucose Statistics and Targets Average Glucose: 182 mg/dL Glucose Management Indicator (GMI): N/A Glucose Variability (%CV): 24.7% Target Range: 70 - 180 mg/dL   Time in Ranges Very High: >250  mg/dL --- 9% High: 324 - 401 mg/dL --- 02% Target Range: 70 - 180 mg/dL --- 72% Low: 54 - 69 mg/dL --- 0% Very Low: <53 mg/dL --- 0%       6/64/4034    8:01 AM 08/17/2022   10:17 AM 06/21/2022    3:48 PM 05/25/2022    7:45 AM 05/04/2022    8:53 AM  Depression screen PHQ 2/9  Decreased Interest 1 1 2 3 1   Down, Depressed, Hopeless 1 1 2 3 1   PHQ - 2 Score 2 2 4 6 2   Altered sleeping 3 1 2 3 1   Tired, decreased energy 2 1 2 3 1   Change in appetite 2 1 1 1 1   Feeling bad or failure about yourself  0 1 2 3 1   Trouble concentrating 0 1 2 3 1   Moving slowly or fidgety/restless 1 1 1 3 1   Suicidal thoughts 0 0 0 0 0  PHQ-9 Score 10 8 14 22 8   Difficult doing work/chores Somewhat difficult Somewhat difficult Somewhat difficult Very difficult Somewhat difficult        09/14/2022    8:01 AM  Fall Risk   Falls in the past year? 1  Number falls in past yr: 0  Injury with Fall? 0  Risk for fall due to : Impaired balance/gait  Follow up Falls evaluation completed;Falls prevention discussed    Patient Care Team: [name], MD as PCP - General (Internal Medicine) [name], MD as Consulting Physician (Oncology) Marlowe Sax, RN as Case Manager (General Practice)   Review of Systems  Constitutional:  Positive for fatigue. Negative for chills and fever.  HENT:  Negative for congestion, rhinorrhea and sore throat.   Respiratory:  Negative for cough and shortness of breath.   Cardiovascular:  Positive for leg swelling. Negative for chest pain.  Gastrointestinal:  Negative for abdominal pain, diarrhea, nausea and vomiting. Constipation: controlled with conservative treatment.      Abdominal bloating  Genitourinary:  Positive for difficulty urinating. Negative for dysuria and urgency.  Musculoskeletal:  Positive for back pain (with radiating pain in right leg) and myalgias.  Neurological:  Negative for dizziness, weakness, light-headedness and headaches.   Psychiatric/Behavioral:  Positive for dysphoric mood. The patient is nervous/anxious.     Current Outpatient Medications on File Prior to Visit  Medication Sig Dispense Refill   acetaminophen (TYLENOL) 500 MG tablet Take 500 mg by mouth every 6 (six) hours as needed for moderate pain or mild pain.     atorvastatin (LIPITOR) 10 MG tablet TAKE ONE TABLET BY MOUTH EVERY EVENING 90 tablet 1   Bilberry, Vaccinium myrtillus, (BILBERRY PO) Take 450 mg by mouth 2 (two) times daily.     Blood Glucose Monitoring Suppl (ONETOUCH VERIO REFLECT) w/Device KIT AS DIRECTED     Blood Pressure Monitoring (SPHYGMOMANOMETER) MISC 1 each by Does not apply route daily in the afternoon. 1 each 0   clotrimazole-betamethasone (LOTRISONE) cream Apply twice daily as needed to area of concern 45 g 2   Continuous Blood Gluc Receiver (FREESTYLE LIBRE 2 READER) DEVI E11.69 Check blood sugar 4 times daily as directed 1 each 0   ferrous sulfate 325 (65 FE) MG tablet Take 325 mg by mouth every evening.     Lancets (ONETOUCH DELICA PLUS LANCET30G) MISC USE TO check blood glucose 2-3 times daily AS DIRECTED 100 each 3   LYCOPENE PO Take by mouth.     MOUNJARO 7.5 MG/0.5ML Pen Inject 7.5mg  into THE SKIN ONCE WEEKLY 6 mL 0   Multiple Vitamin (MULTIVITAMIN WITH MINERALS) TABS tablet Take 1 tablet by mouth daily. Solar ray     OVER THE COUNTER MEDICATION Take 1 tablet by mouth in the morning and at bedtime. Lutein for eyes     polyethylene glycol (MIRALAX / GLYCOLAX) 17 g packet Take by mouth.     Probiotic Product (PROBIOTIC PO) Take 1 capsule by mouth in the morning. 3.2 billion cfu     prochlorperazine (COMPAZINE) 5 MG tablet TAKE ONE TABLET BY MOUTH every SIX hours AS NEEDED FOR NAUSEA AND VOMITING 30 tablet 1   Sod Fluoride-Potassium Nitrate (SODIUM FLUORIDE 5000 SENSITIVE) 1.1-5 % GEL BRUSH ONE bead gently ON teeth FOR TWO MINUTES TWICE DAILY     SYNJARDY 12.05-998 MG TABS TAKE ONE TABLET BY MOUTH TWICE DAILY 180 tablet 1    Current Facility-Administered Medications on File Prior to Visit  Medication Dose Route Frequency Provider Last Rate Last Admin   sodium chloride flush (NS) 0.9 % injection 10 mL  10 mL Intracatheter PRN Mosher, Kelli A, PA-C   10 mL at 02/19/21 1004   Past Medical History:  Diagnosis Date   Acute postoperative respiratory insufficiency 04/17/2020   AKI (acute kidney injury) (HCC) 04/17/2020   Anemia    Anemia, unspecified    Anemia, unspecified    Anemia, unspecified    Anxiety    Arthritis    BRCA gene mutation positive    Chronic pain syndrome    Complication of anesthesia    Difficulty waking up   Depression    Diabetes  mellitus without complication (HCC)    type 2   Diabetic ulcer of toe of left foot associated with type 2 diabetes mellitus, with fat layer exposed (HCC) 03/27/2020   Gastritis    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 No longer has   Past Surgical History:  Procedure Laterality Date   AMPUTATION TOE Bilateral 03/04/2022   Procedure: AMPUTATION TOE RIGHT FOOT PARTIAL OR TOTAL GREAT TOE AND SECOND TOE, LEFT FOOT PARTIAL OR TOTAL GREAT TOE, SECOND AND THIRD TOE;  Surgeon: Pilar Plate, DPM;  Location: [facility] OR;  Service: Podiatry;  Laterality: Bilateral;   APPENDECTOMY     BACK SURGERY     x 3 lower disc   BILATERAL TOTAL MASTECTOMY WITH AXILLARY LYMPH NODE DISSECTION Bilateral 09/2019   CHOLECYSTECTOMY     nephrolithiasis     ROBOTIC ASSISTED TOTAL HYSTERECTOMY  WITH BILATERAL SALPINGO OOPHERECTOMY Bilateral 06/14/2016   Procedure: ROBOTIC ASSISTED TOTAL HYSTERECTOMY WITH BILATERAL SALPINGO OOPHORECTOMY;  Surgeon: Cleda Mccreedy, MD;  Location: [facility];  Service: Gynecology;  Laterality: Bilateral;    Family History  Problem Relation Age of Onset   Cancer Mother        breast   CAD Father    Diabetes Father    Heart failure Father    Cancer Father        renal carcinoma   Kidney failure Father    Cancer Brother 64       renal carcinoma.   Stroke Paternal Grandmother    Social History   Socioeconomic History   Marital status: Divorced    Spouse name: Not on file   Number of children: Not on file   Years of education: Not on file   Highest education level: Associate degree: occupational, Scientist, product/process development, or vocational program  Occupational History   Occupation: disabled  Tobacco Use   Smoking status: Never   Smokeless tobacco: Never  Substance and Sexual Activity   Alcohol use: No   Drug use: No   Sexual activity: Yes  Other Topics Concern   Not on file  Social History Narrative   wears sunscreen, brushes and flosses daily, see's dentist bi-annually, has smoke/carbon monoxide detectors, wears a seatbelt and practices gun safety   Social Determinants of Health   Financial Resource Strain: Medium Risk (09/13/2022)   Overall Financial Resource Strain (CARDIA)    Difficulty of Paying Living Expenses: Somewhat hard  Food Insecurity: Food Insecurity Present (09/13/2022)   Hunger Vital Sign    Worried About Running Out of Food in the Last Year: Sometimes true    Ran Out of Food in the Last Year: Never true  Transportation Needs: No Transportation Needs (09/13/2022)   PRAPARE - Administrator, Civil Service (Medical): No    Lack of Transportation (Non-Medical): No  Physical Activity: Inactive (09/13/2022)   Exercise Vital Sign    Days of Exercise per Week: 0 days    Minutes of  Exercise per Session: 0 min  Stress: Stress Concern  Present (09/13/2022)   [employer] of Occupational Health - Occupational Stress Questionnaire    Feeling of Stress : Rather much  Social Connections: Moderately Isolated (09/13/2022)   Social Connection and Isolation Panel [NHANES]    Frequency of Communication with Friends and Family: More than three times a week    Frequency of Social Gatherings with Friends and Family: Once a week    Attends Religious Services: More than 4 times per year    Active Member of [employer] or Organizations: No    Attends Engineer, structural: Never    Marital Status: Divorced    Objective:  BP 110/64   Temp (!) 97.1 F (36.2 C)   Resp 18   Ht 5\' 2"  (1.575 m)   Wt 201 lb 9.6 oz (91.4 kg)   LMP 06/13/2009   BMI 36.87 kg/m      09/14/2022    7:56 AM 06/22/2022   10:06 AM 05/25/2022    7:34 AM  BP/Weight  Systolic BP 110 132 110  Diastolic BP 64 70 56  Wt. (Lbs) 201.6 203 206  BMI 36.87 kg/m2 37.13 kg/m2 37.68 kg/m2    Physical Exam Vitals reviewed.  Constitutional:      Appearance: Normal appearance. She is obese.  Neck:     Vascular: No carotid bruit.  Cardiovascular:     Rate and Rhythm: Normal rate and regular rhythm.     Heart sounds: Normal heart sounds.  Pulmonary:     Effort: Pulmonary effort is normal. No respiratory distress.     Breath sounds: Normal breath sounds.  Abdominal:     General: Abdomen is flat. Bowel sounds are normal.     Palpations: Abdomen is soft.     Tenderness: There is no abdominal tenderness.  Musculoskeletal:        General: Tenderness (lumbar) present.  Neurological:     Mental Status: She is alert and oriented to person, place, and time.  Psychiatric:        Mood and Affect: Mood normal.        Behavior: Behavior normal.     Diabetic Foot Exam - Simple   Simple Foot Form Visual Inspection See comments: Yes Sensation Testing Intact to touch and monofilament testing bilaterally: Yes Pulse Check Posterior Tibialis and Dorsalis pulse  intact bilaterally: Yes Comments Amputation left great toe and partial amputation 2nd and 3rd toes. Partial amputation right great toe and partial amputation 2nd toe.  3rd toe right foot (nail is discolored      Lab Results  Component Value Date   WBC 7.4 09/14/2022   HGB 12.2 09/14/2022   HCT 40.1 09/14/2022   PLT 118 (L) 09/14/2022   GLUCOSE 120 (H) 09/14/2022   CHOL 149 09/14/2022   TRIG 175 (H) 09/14/2022   HDL 43 09/14/2022   LDLCALC 76 09/14/2022   ALT 14 09/14/2022   AST 17 09/14/2022   NA 144 09/14/2022   K 4.3 09/14/2022   CL 103 09/14/2022   CREATININE 0.96 09/14/2022   BUN 9 09/14/2022   CO2 24 09/14/2022   TSH 3.150 05/25/2022   INR 1.0 03/01/2022   HGBA1C 6.5 (H) 09/14/2022   MICROALBUR 30 10/09/2019      Assessment & Plan:    Liver cirrhosis secondary to NASH (nonalcoholic steatohepatitis) (HCC) Assessment & Plan: Management per specialist.  Orders: -     Morphine Sulfate ER; Take 1 tablet (30 mg  total) by mouth every 12 (twelve) hours.  Dispense: 60 tablet; Refill: 0 -     Ondansetron HCl; Take 1 tablet (4 mg total) by mouth every 4 (four) hours as needed for nausea.  Dispense: 90 tablet; Refill: 3 -     Pantoprazole Sodium; Take 1 tablet (40 mg total) by mouth 2 (two) times daily.  Dispense: 180 tablet; Refill: 1 -     Potassium Chloride ER; Take 2 capsules (20 mEq total) by mouth 2 (two) times daily.  Dispense: 360 capsule; Refill: 3 -     Vitamin D (Ergocalciferol); TAKE ONE CAPSULE BY MOUTH ONCE WEEKLY ON FRIDAY  Dispense: 12 capsule; Refill: 1  Acquired hypothyroidism Assessment & Plan: Previously well controlled Continue Synthroid at current dose  Recheck TSH and adjust Synthroid as indicated    Orders: -     Levothyroxine Sodium; Take 1 tablet (75 mcg total) by mouth daily before breakfast.  Dispense: 90 tablet; Refill: 3  Mixed hyperlipidemia Assessment & Plan: Well controlled.  No changes to medicines. Taking Atorvastatin 10 mg 1  tablet daily, lovaza 1 gm 2 capsules twice daily. Continue to work on eating a healthy diet and exercise.  Labs drawn today.    Orders: -     Lipid panel -     Omega-3-acid Ethyl Esters; Take 2 capsules (2 g total) by mouth 2 (two) times daily.  Dispense: 360 capsule; Refill: 1  Difficulty urinating -     POCT URINALYSIS DIP (CLINITEK)  Type 2 diabetes mellitus with diabetic neuropathy, with long-term current use of insulin (HCC) Assessment & Plan: Other complications: Nephropathy. Control: not at goal. Recommend continue CGM. Recommend check feet daily. Recommend annual eye exams. Medicines: Mounjaro 7.5 mg once weekly, Synjardy 12.5 mg/ 1000 mg 1 tablet twice daily, Tresiba inject 60 units daily. Continue to work on eating a healthy diet and exercise.  Labs drawn today.    Orders: -     CBC with Differential/Platelet -     Comprehensive metabolic panel -     Hemoglobin A1c -     Lipid panel -     POCT URINALYSIS DIP (CLINITEK) -     Albuterol Sulfate HFA; Inhale 2 puffs into the lungs every 6 (six) hours as needed for wheezing or shortness of breath.  Dispense: 8.5 g; Refill: 1 -     Amitriptyline HCl; Take 1 tablet (25 mg total) by mouth at bedtime.  Dispense: 30 tablet; Refill: 2 -     buPROPion HCl ER (XL); Take 1 tablet (300 mg total) by mouth every morning.  Dispense: 90 tablet; Refill: 3 -     busPIRone HCl; Take 1 tablet (5 mg total) by mouth 3 (three) times daily.  Dispense: 90 tablet; Refill: 5 -     Cariprazine HCl; Take 1 capsule (3 mg total) by mouth daily.  Dispense: 90 capsule; Refill: 0 -     FreeStyle Libre 2 Sensor; USE TO check blood glucose AS DIRECTED AND CHANGE sensor every 14 DAYS  Dispense: 6 each; Refill: 3 -     Cyclobenzaprine HCl; TAKE ONE TABLET BY MOUTH every EIGHT hours AS NEEDED FOR MUSCLE SPASMS  Dispense: 270 tablet; Refill: 1 -     Dicyclomine HCl; TAKE ONE TABLET BY MOUTH BEFORE meals AND AT bedtime as needed FOR stomach cramping.  Dispense: 180  tablet; Refill: 1 -     Fetzima; Take 1 capsule by mouth every evening.  Dispense: 90 capsule; Refill: 1 -  Gabapentin; Take 2 capsules (800 mg total) by mouth 3 (three) times daily.  Dispense: 180 capsule; Refill: 1 -     Tresiba FlexTouch; Inject 60 Units into the skin daily.  Dispense: 9 mL; Refill: 3 -     Levothyroxine Sodium; Take 1 tablet (75 mcg total) by mouth daily before breakfast.  Dispense: 90 tablet; Refill: 3 -     Losartan Potassium; Take 1 tablet (50 mg total) by mouth every evening.  Dispense: 90 tablet; Refill: 0 -     Magnesium; Take 1 capsule (500 mg total) by mouth daily.  Dispense: 90 capsule; Refill: 1 -     Morphine Sulfate ER; Take 1 tablet (30 mg total) by mouth every 12 (twelve) hours.  Dispense: 60 tablet; Refill: 0 -     Omega-3-acid Ethyl Esters; Take 2 capsules (2 g total) by mouth 2 (two) times daily.  Dispense: 360 capsule; Refill: 1 -     Ondansetron HCl; Take 1 tablet (4 mg total) by mouth every 4 (four) hours as needed for nausea.  Dispense: 90 tablet; Refill: 3 -     Pantoprazole Sodium; Take 1 tablet (40 mg total) by mouth 2 (two) times daily.  Dispense: 180 tablet; Refill: 1 -     Potassium Chloride ER; Take 2 capsules (20 mEq total) by mouth 2 (two) times daily.  Dispense: 360 capsule; Refill: 3 -     Ramelteon; Take 1 tablet (8 mg total) by mouth at bedtime.  Dispense: 90 tablet; Refill: 1 -     rOPINIRole HCl; Take 1 tablet (0.25 mg total) by mouth at bedtime.  Dispense: 90 tablet; Refill: 1 -     Vitamin D (Ergocalciferol); TAKE ONE CAPSULE BY MOUTH ONCE WEEKLY ON FRIDAY  Dispense: 12 capsule; Refill: 1 -     Lisdexamfetamine Dimesylate; Take 1 capsule (70 mg total) by mouth every morning.  Dispense: 30 capsule; Refill: 0  Muscle spasms of lower extremity, unspecified laterality Assessment & Plan: The current medical regimen is effective;  continue present plan and medications.  Flexeril 10 mg every 8 hour PRN  Orders: -     Cyclobenzaprine HCl;  TAKE ONE TABLET BY MOUTH every EIGHT hours AS NEEDED FOR MUSCLE SPASMS  Dispense: 270 tablet; Refill: 1  RLS (restless legs syndrome) Assessment & Plan: Not controlled.   Start Requip 0.25 mg at bedtime  Orders: -     rOPINIRole HCl; Take 1 tablet (0.25 mg total) by mouth at bedtime.  Dispense: 90 tablet; Refill: 1  Binge eating disorder Assessment & Plan: The current medical regimen is effective;  continue present plan and medications. Continue Vyvanse to 70 mg daily.    Orders: -     Lisdexamfetamine Dimesylate; Take 1 capsule (70 mg total) by mouth every morning.  Dispense: 30 capsule; Refill: 0  Essential hypertension, benign Assessment & Plan: Well controlled.  No changes to medicines. Continue Losartan potassium 50 mg take 1 tablet daily, Potassium Chloride 10 meq take 2 capsules BID Continue to work on eating a healthy diet and exercise.  Labs drawn today.    Orders: -     CBC with Differential/Platelet -     Comprehensive metabolic panel -     Losartan Potassium; Take 1 tablet (50 mg total) by mouth every evening.  Dispense: 90 tablet; Refill: 0  Mild recurrent major depression (HCC) Assessment & Plan: The current medical regimen is effective;  continue present plan and medications.  currently taking Fetzima 80 mg  daily, Bupropion 300 mg 1 tablet daily, Buspirone 5 mg take 1 tablet three times a day.       Meds ordered this encounter  Medications   albuterol (VENTOLIN HFA) 108 (90 Base) MCG/ACT inhaler    Sig: Inhale 2 puffs into the lungs every 6 (six) hours as needed for wheezing or shortness of breath.    Dispense:  8.5 g    Refill:  1   amitriptyline (ELAVIL) 25 MG tablet    Sig: Take 1 tablet (25 mg total) by mouth at bedtime.    Dispense:  30 tablet    Refill:  2   buPROPion (WELLBUTRIN XL) 300 MG 24 hr tablet    Sig: Take 1 tablet (300 mg total) by mouth every morning.    Dispense:  90 tablet    Refill:  3   busPIRone (BUSPAR) 5 MG tablet    Sig:  Take 1 tablet (5 mg total) by mouth 3 (three) times daily.    Dispense:  90 tablet    Refill:  5   cariprazine (VRAYLAR) 3 MG capsule    Sig: Take 1 capsule (3 mg total) by mouth daily.    Dispense:  90 capsule    Refill:  0   Continuous Glucose Sensor (FREESTYLE LIBRE 2 SENSOR) MISC    Sig: USE TO check blood glucose AS DIRECTED AND CHANGE sensor every 14 DAYS    Dispense:  6 each    Refill:  3   cyclobenzaprine (FLEXERIL) 10 MG tablet    Sig: TAKE ONE TABLET BY MOUTH every EIGHT hours AS NEEDED FOR MUSCLE SPASMS    Dispense:  270 tablet    Refill:  1   dicyclomine (BENTYL) 20 MG tablet    Sig: TAKE ONE TABLET BY MOUTH BEFORE meals AND AT bedtime as needed FOR stomach cramping.    Dispense:  180 tablet    Refill:  1    This prescription was filled on 10/29/2019. Any refills authorized will be placed on file.   Levomilnacipran HCl ER (FETZIMA) 80 MG CP24    Sig: Take 1 capsule by mouth every evening.    Dispense:  90 capsule    Refill:  1   gabapentin (NEURONTIN) 400 MG capsule    Sig: Take 2 capsules (800 mg total) by mouth 3 (three) times daily.    Dispense:  180 capsule    Refill:  1    This prescription was filled on 07/19/2022. Any refills authorized will be placed on file.   insulin degludec (TRESIBA FLEXTOUCH) 200 UNIT/ML FlexTouch Pen    Sig: Inject 60 Units into the skin daily.    Dispense:  9 mL    Refill:  3   levothyroxine (SYNTHROID) 75 MCG tablet    Sig: Take 1 tablet (75 mcg total) by mouth daily before breakfast.    Dispense:  90 tablet    Refill:  3   losartan (COZAAR) 50 MG tablet    Sig: Take 1 tablet (50 mg total) by mouth every evening.    Dispense:  90 tablet    Refill:  0   Magnesium 500 MG CAPS    Sig: Take 1 capsule (500 mg total) by mouth daily.    Dispense:  90 capsule    Refill:  1   morphine (MS CONTIN) 30 MG 12 hr tablet    Sig: Take 1 tablet (30 mg total) by mouth every 12 (twelve) hours.    Dispense:  60 tablet    Refill:  0   omega-3  acid ethyl esters (LOVAZA) 1 g capsule    Sig: Take 2 capsules (2 g total) by mouth 2 (two) times daily.    Dispense:  360 capsule    Refill:  1   ondansetron (ZOFRAN) 4 MG tablet    Sig: Take 1 tablet (4 mg total) by mouth every 4 (four) hours as needed for nausea.    Dispense:  90 tablet    Refill:  3   pantoprazole (PROTONIX) 40 MG tablet    Sig: Take 1 tablet (40 mg total) by mouth 2 (two) times daily.    Dispense:  180 tablet    Refill:  1    This prescription was filled on 07/19/2022. Any refills authorized will be placed on file.   potassium chloride (MICRO-K) 10 MEQ CR capsule    Sig: Take 2 capsules (20 mEq total) by mouth 2 (two) times daily.    Dispense:  360 capsule    Refill:  3   ramelteon (ROZEREM) 8 MG tablet    Sig: Take 1 tablet (8 mg total) by mouth at bedtime.    Dispense:  90 tablet    Refill:  1   rOPINIRole (REQUIP) 0.25 MG tablet    Sig: Take 1 tablet (0.25 mg total) by mouth at bedtime.    Dispense:  90 tablet    Refill:  1   Vitamin D, Ergocalciferol, (DRISDOL) 1.25 MG (50000 UNIT) CAPS capsule    Sig: TAKE ONE CAPSULE BY MOUTH ONCE WEEKLY ON FRIDAY    Dispense:  12 capsule    Refill:  1    This prescription was filled on 07/19/2022. Any refills authorized will be placed on file.   lisdexamfetamine (VYVANSE) 70 MG capsule    Sig: Take 1 capsule (70 mg total) by mouth every morning.    Dispense:  30 capsule    Refill:  0    Orders Placed This Encounter  Procedures   CBC with Differential/Platelet   Comprehensive metabolic panel   Hemoglobin A1c   Lipid panel   POCT URINALYSIS DIP (CLINITEK)     Follow-up: Return in about 3 months (around 12/15/2022).   I,Marla I Leal-Borjas,acting as a scribe for [name], MD.,have documented all relevant documentation on the behalf of [name], MD,as directed by  [name], MD while in the presence of [name], MD.   An After Visit Summary was printed and given to the patient.  [name],  MD [facility] [phone]

## 2022-09-14 ENCOUNTER — Encounter: Payer: Self-pay | Admitting: Family Medicine

## 2022-09-14 ENCOUNTER — Ambulatory Visit (INDEPENDENT_AMBULATORY_CARE_PROVIDER_SITE_OTHER): Payer: PPO | Admitting: Family Medicine

## 2022-09-14 VITALS — BP 110/64 | Temp 97.1°F | Resp 18 | Ht 62.0 in | Wt 201.6 lb

## 2022-09-14 DIAGNOSIS — E782 Mixed hyperlipidemia: Secondary | ICD-10-CM

## 2022-09-14 DIAGNOSIS — I96 Gangrene, not elsewhere classified: Secondary | ICD-10-CM | POA: Diagnosis not present

## 2022-09-14 DIAGNOSIS — I10 Essential (primary) hypertension: Secondary | ICD-10-CM

## 2022-09-14 DIAGNOSIS — N1832 Chronic kidney disease, stage 3b: Secondary | ICD-10-CM

## 2022-09-14 DIAGNOSIS — E1122 Type 2 diabetes mellitus with diabetic chronic kidney disease: Secondary | ICD-10-CM | POA: Diagnosis not present

## 2022-09-14 DIAGNOSIS — F5102 Adjustment insomnia: Secondary | ICD-10-CM

## 2022-09-14 DIAGNOSIS — K7581 Nonalcoholic steatohepatitis (NASH): Secondary | ICD-10-CM | POA: Diagnosis not present

## 2022-09-14 DIAGNOSIS — Z794 Long term (current) use of insulin: Secondary | ICD-10-CM | POA: Diagnosis not present

## 2022-09-14 DIAGNOSIS — E1159 Type 2 diabetes mellitus with other circulatory complications: Secondary | ICD-10-CM | POA: Diagnosis not present

## 2022-09-14 DIAGNOSIS — R39198 Other difficulties with micturition: Secondary | ICD-10-CM

## 2022-09-14 DIAGNOSIS — I152 Hypertension secondary to endocrine disorders: Secondary | ICD-10-CM | POA: Diagnosis not present

## 2022-09-14 DIAGNOSIS — J209 Acute bronchitis, unspecified: Secondary | ICD-10-CM

## 2022-09-14 DIAGNOSIS — E039 Hypothyroidism, unspecified: Secondary | ICD-10-CM

## 2022-09-14 DIAGNOSIS — G2581 Restless legs syndrome: Secondary | ICD-10-CM

## 2022-09-14 DIAGNOSIS — M86671 Other chronic osteomyelitis, right ankle and foot: Secondary | ICD-10-CM | POA: Diagnosis not present

## 2022-09-14 DIAGNOSIS — I129 Hypertensive chronic kidney disease with stage 1 through stage 4 chronic kidney disease, or unspecified chronic kidney disease: Secondary | ICD-10-CM

## 2022-09-14 DIAGNOSIS — M62838 Other muscle spasm: Secondary | ICD-10-CM

## 2022-09-14 DIAGNOSIS — F333 Major depressive disorder, recurrent, severe with psychotic symptoms: Secondary | ICD-10-CM

## 2022-09-14 DIAGNOSIS — Z1501 Genetic susceptibility to malignant neoplasm of breast: Secondary | ICD-10-CM

## 2022-09-14 DIAGNOSIS — E1140 Type 2 diabetes mellitus with diabetic neuropathy, unspecified: Secondary | ICD-10-CM

## 2022-09-14 DIAGNOSIS — F332 Major depressive disorder, recurrent severe without psychotic features: Secondary | ICD-10-CM

## 2022-09-14 DIAGNOSIS — K7460 Unspecified cirrhosis of liver: Secondary | ICD-10-CM

## 2022-09-14 DIAGNOSIS — E1121 Type 2 diabetes mellitus with diabetic nephropathy: Secondary | ICD-10-CM

## 2022-09-14 DIAGNOSIS — F330 Major depressive disorder, recurrent, mild: Secondary | ICD-10-CM | POA: Diagnosis not present

## 2022-09-14 DIAGNOSIS — M86672 Other chronic osteomyelitis, left ankle and foot: Secondary | ICD-10-CM | POA: Diagnosis not present

## 2022-09-14 DIAGNOSIS — F5081 Binge eating disorder: Secondary | ICD-10-CM

## 2022-09-14 LAB — POCT URINALYSIS DIP (CLINITEK)
Bilirubin, UA: NEGATIVE
Blood, UA: NEGATIVE
Glucose, UA: 250 mg/dL — AB
Ketones, POC UA: NEGATIVE mg/dL
Leukocytes, UA: NEGATIVE
Nitrite, UA: NEGATIVE
POC PROTEIN,UA: NEGATIVE
Spec Grav, UA: >=1.03 — AB (ref 1.010–1.025)
Urobilinogen, UA: 0.2 E.U./dL
pH, UA: 5.5 (ref 5.0–8.0)

## 2022-09-14 MED ORDER — LISDEXAMFETAMINE DIMESYLATE 70 MG PO CAPS
70.0000 mg | ORAL_CAPSULE | Freq: Every morning | ORAL | 0 refills | Status: DC
Start: 2022-09-14 — End: 2022-09-19

## 2022-09-14 MED ORDER — BUSPIRONE HCL 5 MG PO TABS
5.0000 mg | ORAL_TABLET | Freq: Three times a day (TID) | ORAL | 5 refills | Status: DC
Start: 2022-09-14 — End: 2023-01-26

## 2022-09-14 MED ORDER — GABAPENTIN 400 MG PO CAPS
800.0000 mg | ORAL_CAPSULE | Freq: Three times a day (TID) | ORAL | 1 refills | Status: DC
Start: 2022-09-14 — End: 2022-10-28

## 2022-09-14 MED ORDER — POTASSIUM CHLORIDE ER 10 MEQ PO CPCR
20.0000 meq | ORAL_CAPSULE | Freq: Two times a day (BID) | ORAL | 3 refills | Status: DC
Start: 2022-09-14 — End: 2023-07-13

## 2022-09-14 MED ORDER — TRESIBA FLEXTOUCH 200 UNIT/ML ~~LOC~~ SOPN
60.0000 [IU] | PEN_INJECTOR | Freq: Every day | SUBCUTANEOUS | 3 refills | Status: AC
Start: 2022-09-14 — End: ?

## 2022-09-14 MED ORDER — BUPROPION HCL ER (XL) 300 MG PO TB24
300.0000 mg | ORAL_TABLET | Freq: Every morning | ORAL | 3 refills | Status: DC
Start: 2022-09-14 — End: 2023-07-13

## 2022-09-14 MED ORDER — DICYCLOMINE HCL 20 MG PO TABS
ORAL_TABLET | ORAL | 1 refills | Status: DC
Start: 2022-09-14 — End: 2022-10-13

## 2022-09-14 MED ORDER — ALBUTEROL SULFATE HFA 108 (90 BASE) MCG/ACT IN AERS
2.0000 | INHALATION_SPRAY | Freq: Four times a day (QID) | RESPIRATORY_TRACT | 1 refills | Status: DC | PRN
Start: 2022-09-14 — End: 2022-09-19

## 2022-09-14 MED ORDER — AMITRIPTYLINE HCL 25 MG PO TABS
25.0000 mg | ORAL_TABLET | Freq: Every day | ORAL | 2 refills | Status: DC
Start: 2022-09-14 — End: 2022-09-19

## 2022-09-14 MED ORDER — FETZIMA 80 MG PO CP24
1.0000 | ORAL_CAPSULE | Freq: Every evening | ORAL | 1 refills | Status: DC
Start: 2022-09-14 — End: 2023-03-01

## 2022-09-14 MED ORDER — VITAMIN D (ERGOCALCIFEROL) 1.25 MG (50000 UNIT) PO CAPS
ORAL_CAPSULE | ORAL | 1 refills | Status: DC
Start: 2022-09-14 — End: 2023-01-14

## 2022-09-14 MED ORDER — MORPHINE SULFATE ER 30 MG PO TBCR
30.0000 mg | EXTENDED_RELEASE_TABLET | Freq: Two times a day (BID) | ORAL | 0 refills | Status: DC
Start: 2022-09-14 — End: 2022-09-19

## 2022-09-14 MED ORDER — FREESTYLE LIBRE 2 SENSOR MISC
3 refills | Status: DC
Start: 2022-09-14 — End: 2023-01-02

## 2022-09-14 MED ORDER — PANTOPRAZOLE SODIUM 40 MG PO TBEC
40.0000 mg | DELAYED_RELEASE_TABLET | Freq: Two times a day (BID) | ORAL | 1 refills | Status: DC
Start: 2022-09-14 — End: 2023-02-14

## 2022-09-14 MED ORDER — OMEGA-3-ACID ETHYL ESTERS 1 G PO CAPS
2.0000 | ORAL_CAPSULE | Freq: Two times a day (BID) | ORAL | 1 refills | Status: DC
Start: 2022-09-14 — End: 2022-09-19

## 2022-09-14 MED ORDER — RAMELTEON 8 MG PO TABS
8.0000 mg | ORAL_TABLET | Freq: Every day | ORAL | 1 refills | Status: DC
Start: 2022-09-14 — End: 2023-01-14

## 2022-09-14 MED ORDER — LEVOTHYROXINE SODIUM 75 MCG PO TABS
75.0000 ug | ORAL_TABLET | Freq: Every day | ORAL | 3 refills | Status: DC
Start: 2022-09-14 — End: 2023-07-13

## 2022-09-14 MED ORDER — MAGNESIUM 500 MG PO CAPS
500.0000 mg | ORAL_CAPSULE | Freq: Every day | ORAL | 1 refills | Status: AC
Start: 2022-09-14 — End: ?

## 2022-09-14 MED ORDER — ONDANSETRON HCL 4 MG PO TABS
4.0000 mg | ORAL_TABLET | ORAL | 3 refills | Status: AC | PRN
Start: 2022-09-14 — End: ?

## 2022-09-14 MED ORDER — ROPINIROLE HCL 0.25 MG PO TABS
0.2500 mg | ORAL_TABLET | Freq: Every day | ORAL | 1 refills | Status: DC
Start: 2022-09-14 — End: 2022-09-19

## 2022-09-14 MED ORDER — CYCLOBENZAPRINE HCL 10 MG PO TABS
ORAL_TABLET | ORAL | 1 refills | Status: DC
Start: 2022-09-14 — End: 2023-01-14

## 2022-09-14 MED ORDER — LOSARTAN POTASSIUM 50 MG PO TABS
50.0000 mg | ORAL_TABLET | Freq: Every evening | ORAL | 0 refills | Status: DC
Start: 2022-09-14 — End: 2022-10-13

## 2022-09-14 MED ORDER — CARIPRAZINE HCL 3 MG PO CAPS
3.0000 mg | ORAL_CAPSULE | Freq: Every day | ORAL | 0 refills | Status: DC
Start: 2022-09-14 — End: 2022-09-19

## 2022-09-14 NOTE — Patient Instructions (Signed)
Needs shingles vaccine at the pharmacy

## 2022-09-15 LAB — CBC WITH DIFFERENTIAL/PLATELET
Basophils Absolute: 0 10*3/uL (ref 0.0–0.2)
Basos: 1 %
EOS (ABSOLUTE): 0.2 10*3/uL (ref 0.0–0.4)
Eos: 2 %
Hematocrit: 40.1 % (ref 34.0–46.6)
Hemoglobin: 12.2 g/dL (ref 11.1–15.9)
Immature Grans (Abs): 0 10*3/uL (ref 0.0–0.1)
Immature Granulocytes: 0 %
Lymphocytes Absolute: 2 10*3/uL (ref 0.7–3.1)
Lymphs: 27 %
MCH: 25.7 pg — ABNORMAL LOW (ref 26.6–33.0)
MCHC: 30.4 g/dL — ABNORMAL LOW (ref 31.5–35.7)
MCV: 84 fL (ref 79–97)
Monocytes Absolute: 0.4 10*3/uL (ref 0.1–0.9)
Monocytes: 6 %
Neutrophils Absolute: 4.8 10*3/uL (ref 1.4–7.0)
Neutrophils: 64 %
Platelets: 118 10*3/uL — ABNORMAL LOW (ref 150–450)
RBC: 4.75 x10E6/uL (ref 3.77–5.28)
RDW: 15.2 % (ref 11.7–15.4)
WBC: 7.4 10*3/uL (ref 3.4–10.8)

## 2022-09-15 LAB — LIPID PANEL
Chol/HDL Ratio: 3.5 ratio (ref 0.0–4.4)
Cholesterol, Total: 149 mg/dL (ref 100–199)
HDL: 43 mg/dL (ref >39)
LDL Chol Calc (NIH): 76 mg/dL (ref 0–99)
Triglycerides: 175 mg/dL — ABNORMAL HIGH (ref 0–149)
VLDL Cholesterol Cal: 30 mg/dL (ref 5–40)

## 2022-09-15 LAB — COMPREHENSIVE METABOLIC PANEL
ALT: 14 IU/L (ref 0–32)
AST: 17 IU/L (ref 0–40)
Albumin: 4.4 g/dL (ref 3.8–4.9)
Alkaline Phosphatase: 98 IU/L (ref 44–121)
BUN/Creatinine Ratio: 9 (ref 9–23)
BUN: 9 mg/dL (ref 6–24)
Bilirubin Total: 0.6 mg/dL (ref 0.0–1.2)
CO2: 24 mmol/L (ref 20–29)
Calcium: 9.7 mg/dL (ref 8.7–10.2)
Chloride: 103 mmol/L (ref 96–106)
Creatinine, Ser: 0.96 mg/dL (ref 0.57–1.00)
Globulin, Total: 2.6 g/dL (ref 1.5–4.5)
Glucose: 120 mg/dL — ABNORMAL HIGH (ref 70–99)
Potassium: 4.3 mmol/L (ref 3.5–5.2)
Sodium: 144 mmol/L (ref 134–144)
Total Protein: 7 g/dL (ref 6.0–8.5)
eGFR: 70 mL/min/{1.73_m2} (ref >59)

## 2022-09-15 LAB — HEMOGLOBIN A1C
Est. average glucose Bld gHb Est-mCnc: 140 mg/dL
Hgb A1c MFr Bld: 6.5 % — ABNORMAL HIGH (ref 4.8–5.6)

## 2022-09-16 ENCOUNTER — Other Ambulatory Visit: Payer: Self-pay

## 2022-09-16 MED ORDER — BD PEN NEEDLE NANO U/F 32G X 4 MM MISC: 2 refills | Status: DC

## 2022-09-17 DIAGNOSIS — M62838 Other muscle spasm: Secondary | ICD-10-CM | POA: Insufficient documentation

## 2022-09-17 DIAGNOSIS — G2581 Restless legs syndrome: Secondary | ICD-10-CM | POA: Insufficient documentation

## 2022-09-17 DIAGNOSIS — F332 Major depressive disorder, recurrent severe without psychotic features: Secondary | ICD-10-CM | POA: Insufficient documentation

## 2022-09-17 DIAGNOSIS — F330 Major depressive disorder, recurrent, mild: Secondary | ICD-10-CM | POA: Insufficient documentation

## 2022-09-17 NOTE — Assessment & Plan Note (Signed)
Previously well controlled Continue Synthroid at current dose  Recheck TSH and adjust Synthroid as indicated   

## 2022-09-17 NOTE — Assessment & Plan Note (Signed)
The current medical regimen is effective;  continue present plan and medications.

## 2022-09-17 NOTE — Assessment & Plan Note (Signed)
The current medical regimen is effective;  continue present plan and medications.  Flexeril 10 mg every 8 hour PRN

## 2022-09-17 NOTE — Assessment & Plan Note (Signed)
Continue on rozerem 8 mg once daily at night.

## 2022-09-17 NOTE — Assessment & Plan Note (Signed)
The current medical regimen is effective;  continue present plan and medications.  currently taking Fetzima 80 mg daily, Bupropion 300 mg 1 tablet daily, Buspirone 5 mg take 1 tablet three times a day.

## 2022-09-17 NOTE — Assessment & Plan Note (Signed)
The current medical regimen is effective;  continue present plan and medications. Continue Vyvanse to 70 mg daily.

## 2022-09-17 NOTE — Assessment & Plan Note (Signed)
Management per specialist. 

## 2022-09-17 NOTE — Assessment & Plan Note (Signed)
The current medical regimen is effective;  continue present plan and medications.  Requip 0.25 mg at bedtime

## 2022-09-18 ENCOUNTER — Encounter: Payer: Self-pay | Admitting: Family Medicine

## 2022-09-18 DIAGNOSIS — R39198 Other difficulties with micturition: Secondary | ICD-10-CM | POA: Insufficient documentation

## 2022-09-18 NOTE — Addendum Note (Signed)
Addended byBlane Ohara on: 09/18/2022 09:24 PM   Modules accepted: Level of Service

## 2022-09-19 ENCOUNTER — Other Ambulatory Visit: Payer: Self-pay | Admitting: Family Medicine

## 2022-09-19 DIAGNOSIS — Z794 Long term (current) use of insulin: Secondary | ICD-10-CM

## 2022-09-19 DIAGNOSIS — E782 Mixed hyperlipidemia: Secondary | ICD-10-CM

## 2022-09-19 DIAGNOSIS — K7460 Unspecified cirrhosis of liver: Secondary | ICD-10-CM | POA: Diagnosis not present

## 2022-09-19 DIAGNOSIS — I700 Atherosclerosis of aorta: Secondary | ICD-10-CM | POA: Diagnosis not present

## 2022-09-19 DIAGNOSIS — R161 Splenomegaly, not elsewhere classified: Secondary | ICD-10-CM | POA: Diagnosis not present

## 2022-09-19 DIAGNOSIS — G2581 Restless legs syndrome: Secondary | ICD-10-CM

## 2022-09-19 DIAGNOSIS — R918 Other nonspecific abnormal finding of lung field: Secondary | ICD-10-CM | POA: Diagnosis not present

## 2022-09-19 DIAGNOSIS — Z9013 Acquired absence of bilateral breasts and nipples: Secondary | ICD-10-CM | POA: Diagnosis not present

## 2022-09-19 DIAGNOSIS — F5081 Binge eating disorder: Secondary | ICD-10-CM

## 2022-09-19 DIAGNOSIS — K7581 Nonalcoholic steatohepatitis (NASH): Secondary | ICD-10-CM | POA: Diagnosis not present

## 2022-09-19 DIAGNOSIS — Z1501 Genetic susceptibility to malignant neoplasm of breast: Secondary | ICD-10-CM

## 2022-09-19 LAB — HEPATIC FUNCTION PANEL
ALT: 14 U/L (ref 7–35)
AST: 24 (ref 13–35)
Alkaline Phosphatase: 74 (ref 25–125)
Bilirubin, Total: 0.7

## 2022-09-19 LAB — BASIC METABOLIC PANEL
BUN: 10 (ref 4–21)
CO2: 26 — AB (ref 13–22)
Chloride: 103 (ref 99–108)
Creatinine: 0.7 (ref 0.5–1.1)
EGFR: 60
Glucose: 240
Potassium: 3.9 meq/L (ref 3.5–5.1)
Sodium: 139 (ref 137–147)

## 2022-09-19 LAB — CBC AND DIFFERENTIAL
HCT: 33 — AB (ref 36–46)
Hemoglobin: 10.7 — AB (ref 12.0–16.0)
Neutrophils Absolute: 2.36
Platelets: 76 10*3/uL — AB (ref 150–400)
WBC: 3.8

## 2022-09-19 LAB — COMPREHENSIVE METABOLIC PANEL
Albumin: 4 (ref 3.5–5.0)
Calcium: 9.1 (ref 8.7–10.7)

## 2022-09-19 LAB — CBC: RBC: 4.07 (ref 3.87–5.11)

## 2022-09-21 ENCOUNTER — Inpatient Hospital Stay: Payer: PPO | Attending: Oncology | Admitting: Oncology

## 2022-09-21 ENCOUNTER — Other Ambulatory Visit: Payer: Self-pay | Admitting: Oncology

## 2022-09-21 ENCOUNTER — Encounter: Payer: Self-pay | Admitting: Oncology

## 2022-09-21 VITALS — BP 136/67 | HR 97 | Temp 97.9°F | Resp 18 | Ht 62.0 in | Wt 205.3 lb

## 2022-09-21 DIAGNOSIS — K7460 Unspecified cirrhosis of liver: Secondary | ICD-10-CM

## 2022-09-21 DIAGNOSIS — D649 Anemia, unspecified: Secondary | ICD-10-CM | POA: Insufficient documentation

## 2022-09-21 DIAGNOSIS — C50311 Malignant neoplasm of lower-inner quadrant of right female breast: Secondary | ICD-10-CM | POA: Diagnosis not present

## 2022-09-21 DIAGNOSIS — D638 Anemia in other chronic diseases classified elsewhere: Secondary | ICD-10-CM

## 2022-09-21 DIAGNOSIS — Z1509 Genetic susceptibility to other malignant neoplasm: Secondary | ICD-10-CM | POA: Diagnosis not present

## 2022-09-21 DIAGNOSIS — Z171 Estrogen receptor negative status [ER-]: Secondary | ICD-10-CM | POA: Insufficient documentation

## 2022-09-21 DIAGNOSIS — K7581 Nonalcoholic steatohepatitis (NASH): Secondary | ICD-10-CM | POA: Diagnosis not present

## 2022-09-21 DIAGNOSIS — D696 Thrombocytopenia, unspecified: Secondary | ICD-10-CM

## 2022-09-21 DIAGNOSIS — Z1501 Genetic susceptibility to malignant neoplasm of breast: Secondary | ICD-10-CM | POA: Diagnosis not present

## 2022-09-21 LAB — VITAMIN B12: Vitamin B-12: 222 pg/mL (ref 180–914)

## 2022-09-21 LAB — FOLATE: Folate: 6.4 ng/mL (ref >5.9)

## 2022-09-21 MED ORDER — HEPARIN SOD (PORK) LOCK FLUSH 100 UNIT/ML IV SOLN
500.0000 [IU] | Freq: Once | INTRAVENOUS | Status: AC | PRN
  Administered 2022-09-21: 500 [IU]

## 2022-09-21 MED ORDER — SODIUM CHLORIDE 0.9% FLUSH
10.0000 mL | INTRAVENOUS | Status: DC | PRN
  Administered 2022-09-21: 10 mL

## 2022-09-22 ENCOUNTER — Other Ambulatory Visit: Payer: Self-pay

## 2022-09-22 ENCOUNTER — Other Ambulatory Visit: Payer: PPO

## 2022-09-22 ENCOUNTER — Telehealth: Payer: Self-pay | Admitting: Oncology

## 2022-09-22 NOTE — Telephone Encounter (Signed)
Contacted pt to schedule an appt. Unable to reach via phone, voicemail was left.   Scheduling Message Entered by [name] on 09/21/2022 at 10:22 AM Priority: Routine <No visit type provided>  Department: [facility]  Provider:  Scheduling Notes:  RT 3 months with labs and port flush

## 2022-09-22 NOTE — Patient Instructions (Signed)
Visit Information  Thank you for taking time to visit with me today. Please don't hesitate to contact me if I can be of assistance to you before our next scheduled telephone appointment.  Following are the goals we discussed today:   Goals Addressed             This Visit's Progress    RNCM Care Management Expected Outcome:  Monitor, Self-Manage and Reduce Symptoms of Diabetes       Current Barriers:  Knowledge Deficits related to concerns about places on her toes and not being able to be seen by podiatrist until next week, the patient expressed concern over being diabetic and not wanting to lose her toes Care Coordination needs related to resources in the area to help with her expressed needs and caring for her self due to lack of support system in a patient with DM Chronic Disease Management support and education needs related to effective management of DM Lacks caregiver support.  Lab Results  Component Value Date   HGBA1C 6.5 (H) 09/14/2022     Planned Interventions: Provided education to patient about basic DM disease process. The patient states that her blood sugars have been up and down. The patient is under goal with her A1c states she is doing well with DM management. Reviewed medications with patient and discussed importance of medication adherence. The patient is compliant with her medications. Denies any acute changes in her medications. ;        Reviewed prescribed diet with patient heart healthy/ADA diet. Is compliant with her diet; Counseled on importance of regular laboratory monitoring as prescribed. Has regular labwork ;        Discussed plans with patient for ongoing care management follow up and provided patient with direct contact information for care management team;      Provided patient with written educational materials related to hypo and hyperglycemia and importance of correct treatment. The lowest the patient has seen is 100 and the highest is 200. ;        Reviewed scheduled/upcoming provider appointments including: 01-06-2023 at 820am with the pcp;         Advised patient, providing education and rationale, to check cbg when you have symptoms of low or high blood sugar and has a continuous glucose reader, freestyle [place]  and record. The patient states that her blood sugars have been up and down. Average blood sugars have been 182. The patient says she knows its a little elevated at times. She states that she is a stress eater and eats things she should not sometimes. Education and support given.  call provider for findings outside established parameters;       Referral made to pharmacy team for assistance with ongoing support and education for medication needs ;       Referral made to social work team for assistance with ongoing support and education for effective management of stress, anxiety, depression ;      Referral made to community resources care guide team for assistance with food resources and community resources;      Review of patient status, including review of consultants reports, relevant laboratory and other test results, and medications completed;       Advised patient to discuss changes in her DM and questions and concerns with provider;      Screening for signs and symptoms of depression related to chronic disease state;        Assessed social determinant of health barriers;  The patient states she is having more pain in her legs and feet and it is sometimes unbearable. The patient states that she has had a change in her medications but cannot tell that it has helped. Ask for recommendations. Was fitted yesterday for therapeutic shoes. Will send an in basket message to the pcp asking for recommendations. Since seeing the pcp and starting on amitriptyline the patient is having less neuropathy pain. She is thankful for this. She also has diabetic shoes that are new. She is going to see the podiatrist on Monday as she is concerned  about some spots she has on her feet since wearing the shoes. Education provided. The patient is being proactive in her care. Review of things to monitor for. Sees the foot doctor next week.   Symptom Management: Take medications as prescribed   Attend all scheduled provider appointments Call provider office for new concerns or questions  call the Suicide and Crisis Lifeline: 988 call the [place] National Suicide Prevention Lifeline: 214-349-6154 or TTY: 629-483-8931 TTY [phone]) to talk to a trained counselor call 1-800-273-TALK (toll free, 24 hour hotline) if experiencing a Mental Health or Behavioral Health Crisis  keep appointment with eye doctor check feet daily for cuts, sores or redness trim toenails straight across manage portion size wash and dry feet carefully every day wear comfortable, cotton socks wear comfortable, well-fitting shoes  Follow Up Plan: Telephone follow up appointment with care management team member scheduled for: 11-17-2022 at 1145 am       RNCM Care Management Expected Outcome:  Monitor, Self-Manage and Reduce Symptoms of HLD       Current Barriers:  Chronic Disease Management support and education needs related to effective management of HLD Lab Results  Component Value Date   CHOL 149 09/14/2022   HDL 43 09/14/2022   LDLCALC 76 09/14/2022   TRIG 175 (H) 09/14/2022   CHOLHDL 3.5 09/14/2022     Planned Interventions: Provider established cholesterol goals reviewed. Review of labs. The patient is above goal on her triglycerides. Education and support given. ; Counseled on importance of regular laboratory monitoring as prescribed; Provided HLD educational materials; Reviewed role and benefits of statin for ASCVD risk reduction. States compliance with medications; Discussed strategies to manage statin-induced myalgias. The patient takes Lipitor 10 mg QD; Reviewed importance of limiting foods high in cholesterol. Education and support provided on a  heart healthy/ADA diet. The patient tries to watch what she is eating. Is trying to eat more vegetables. In the past she has not liked vegetables ; Screening for signs and symptoms of depression related to chronic disease state;  Assessed social determinant of health barriers;   Symptom Management: Take medications as prescribed   Attend all scheduled provider appointments Call provider office for new concerns or questions  call the Suicide and Crisis Lifeline: 988 call the [place] National Suicide Prevention Lifeline: 678-390-8657 or TTY: 936-201-5014 TTY (925) 386-5952) to talk to a trained counselor call 1-800-273-TALK (toll free, 24 hour hotline) if experiencing a Mental Health or Behavioral Health Crisis  - take all medications exactly as prescribed - call doctor with any symptoms you believe are related to your medicine - call doctor when you experience any new symptoms - go to all doctor appointments as scheduled - adhere to prescribed diet: heart healthy/ADA diet   Follow Up Plan: Telephone follow up appointment with care management team member scheduled for: 11-17-2022 at 1145 am       RNCM Care Management Expected Outcome:  Monitor, Self-Manage and Reduce Symptoms of: Depression       Current Barriers:  Knowledge Deficits related to how to manage emotions and stress level in a patient with multiple chronic conditions including depression Care Coordination needs related to resources in the community for food insecurities and additional support for social worker support  in a patient with depression and increased stress level Chronic Disease Management support and education needs related to effective management of depression  Lacks caregiver support.   Planned Interventions: Evaluation of current treatment plan related to depression  and patient's adherence to plan as established by provider. The patient states she has good days and bad days. She states that some days she feels like  she can do anything but her health limits her in what she can do. Some days she does not feel like getting up out of the bed and sometimes that is because of the pain she has.She feels like she is stable at this time. She does know she has the option to see a psychiatrist. She says a lot of her issues stem from her husband leaving her after 18 years of marriage. Reflective listening and support given. She states that she is stressed out and is dealing with things with her brother and her dads estate. She states that she wants to know if there is legal aide assistance. The patient states that she just needs some guidance. Will collaborate with the SW on expressed needs.  Advised patient to call the office for changes in mood, anxiety, depression, stress level, or mental health needs  Provided education to patient re: doing mindfulness activities, journal writing, creative ways to help her when she is stressed out. The patient is doing much better and her feet are healing. She states that she is having good days and some bad days. Overall she is in good spirits and is thankful for the support of her providers. She sees providers on a regular basis.  Reviewed medications with patient and discussed compliance. States she is compliant with medications Collaborated with LCSW and scheduling care guides regarding the need for follow up by LCSW. The LCSW has worked with the patient before for depression and other needs Provided patient with resources and support for effective management of depression  educational materials related to managing her stress level and depression  Reviewed scheduled/upcoming provider appointments including 01-06-2023 at 820am Care Guide referral for food insecurities and scheduling follow up with LCSW Social Work referral for support and education needs related to depression and stress. Ongoing support and education from the LCSW.  Discussed plans with patient for ongoing care management  follow up and provided patient with direct contact information for care management team Advised patient to discuss changes in her depression and anxiety level, questions and concerns on how to best manage her chronic care needs due to limited support system with provider Screening for signs and symptoms of depression related to chronic disease state  Assessed social determinant of health barriers Review of coping skills and doing things that help her. Loneliness is one of the biggest things for her. Education and support given.   Symptom Management: Take medications as prescribed   Attend all scheduled provider appointments Call provider office for new concerns or questions  call the Suicide and Crisis Lifeline: 988 call the [place] National Suicide Prevention Lifeline: 779-328-9075 or TTY: 646-617-2719 TTY 856-273-6200) to talk to a trained counselor call 1-800-273-TALK (toll free, 24 hour hotline) if experiencing a Mental Health or Behavioral  Health Crisis   Follow Up Plan: Telephone follow up appointment with care management team member scheduled for: 11-17-2022 at 1145 am       RNCM Care Management Expected Outcome:  Monitor, Self-Manage, and Reduce Symptoms of Hypertension       Current Barriers:  Chronic Disease Management support and education needs related to effective management of HTN Lacks caregiver support.   BP Readings from Last 3 Encounters:  09/21/22 136/67  09/14/22 110/64  06/22/22 132/70    Planned Interventions: Evaluation of current treatment plan related to hypertension self management and patient's adherence to plan as established by provider. The patients blood pressures are a little on the low side. She denies any light headedness or dizziness. Has had adjustments in her medications. Blood pressures are more stable. The patient knows to call the provider for changes. Education provided to call the provider for acute changes in blood pressures   Provided education  to patient re: stroke prevention, s/s of heart attack and stroke; Reviewed prescribed diet heart  healthy/ADA diet. The patient is monitoring her dietary intake. Reviewed medications with patient and discussed importance of compliance. The patient states compliance with medications. Had recent changes in her blood pressure medications;  Discussed plans with patient for ongoing care management follow up and provided patient with direct contact information for care management team; Advised patient, providing education and rationale, to monitor blood pressure daily and record, calling PCP for findings outside established parameters;  Reviewed scheduled/upcoming provider appointments including: 01-06-2023 at 8 20am Advised patient to discuss changes in blood pressures and heart health with provider; Provided education on prescribed diet heart healthy/ADA diet ;  Discussed complications of poorly controlled blood pressure such as heart disease, stroke, circulatory complications, vision complications, kidney impairment, sexual dysfunction;  Screening for signs and symptoms of depression related to chronic disease state;  Assessed social determinant of health barriers;   Symptom Management: Take medications as prescribed   Attend all scheduled provider appointments Call provider office for new concerns or questions  call the Suicide and Crisis Lifeline: 988 call the [place] National Suicide Prevention Lifeline: 9707151135 or TTY: 603-566-9056 TTY 519-247-7958) to talk to a trained counselor call 1-800-273-TALK (toll free, 24 hour hotline) if experiencing a Mental Health or Behavioral Health Crisis  check blood pressure weekly learn about high blood pressure call doctor for signs and symptoms of high blood pressure develop an action plan for high blood pressure keep all doctor appointments take medications for blood pressure exactly as prescribed report new symptoms to your doctor  Follow Up  Plan: Telephone follow up appointment with care management team member scheduled for: 11-17-2022 at 1145 am           Our next appointment is by telephone on 11-17-2022 at 1145 am  Please call the care guide team at [phone] if you need to cancel or reschedule your appointment.   If you are experiencing a Mental Health or Behavioral Health Crisis or need someone to talk to, please call the Suicide and Crisis Lifeline: 988 call the [place] National Suicide Prevention Lifeline: 713-490-4555 or TTY: 701 753 8197 TTY 971-297-0923) to talk to a trained counselor call 1-800-273-TALK (toll free, 24 hour hotline) go to [facility] [address] [place] [phone])   Patient verbalizes understanding of instructions and care plan provided today and agrees to view in MyChart. Active MyChart status and patient understanding of how to access instructions and care plan via MyChart confirmed with patient.  Alto Denver RN, MSN, CCM RN Care Manager  [facility]  Direct Number: [phone]

## 2022-09-22 NOTE — Patient Outreach (Signed)
Care Management   Visit Note  09/22/2022 Name: [name] MRN: [id number] DOB: 1970-06-05  Subjective: Linda [name] is a 52 y.o. year old female who is a primary care patient of [name], Kirsten, MD. The Care Management team was consulted for assistance.      Engaged with patient spoke with patient by telephone.    Goals Addressed             This Visit's Progress    RNCM Care Management Expected Outcome:  Monitor, Self-Manage and Reduce Symptoms of Diabetes       Current Barriers:  Knowledge Deficits related to concerns about places on her toes and not being able to be seen by podiatrist until next week, the patient expressed concern over being diabetic and not wanting to lose her toes Care Coordination needs related to resources in the area to help with her expressed needs and caring for her self due to lack of support system in a patient with DM Chronic Disease Management support and education needs related to effective management of DM Lacks caregiver support.  Lab Results  Component Value Date   HGBA1C 6.5 (H) 09/14/2022     Planned Interventions: Provided education to patient about basic DM disease process. The patient states that her blood sugars have been up and down. The patient is under goal with her A1c states she is doing well with DM management. Reviewed medications with patient and discussed importance of medication adherence. The patient is compliant with her medications. Denies any acute changes in her medications. ;        Reviewed prescribed diet with patient heart healthy/ADA diet. Is compliant with her diet; Counseled on importance of regular laboratory monitoring as prescribed. Has regular labwork ;        Discussed plans with patient for ongoing care management follow up and provided patient with direct contact information for care management team;      Provided patient with written educational materials related to hypo and hyperglycemia and  importance of correct treatment. The lowest the patient has seen is 100 and the highest is 200. ;       Reviewed scheduled/upcoming provider appointments including: 01-06-2023 at 820am with the pcp;         Advised patient, providing education and rationale, to check cbg when you have symptoms of low or high blood sugar and has a continuous glucose reader, freestyle [place]  and record. The patient states that her blood sugars have been up and down. Average blood sugars have been 182. The patient says she knows its a little elevated at times. She states that she is a stress eater and eats things she should not sometimes. Education and support given.  call provider for findings outside established parameters;       Referral made to pharmacy team for assistance with ongoing support and education for medication needs ;       Referral made to social work team for assistance with ongoing support and education for effective management of stress, anxiety, depression ;      Referral made to community resources care guide team for assistance with food resources and community resources;      Review of patient status, including review of consultants reports, relevant laboratory and other test results, and medications completed;       Advised patient to discuss changes in her DM and questions and concerns with provider;      Screening for signs and symptoms of  depression related to chronic disease state;        Assessed social determinant of health barriers;        The patient states she is having more pain in her legs and feet and it is sometimes unbearable. The patient states that she has had a change in her medications but cannot tell that it has helped. Ask for recommendations. Was fitted yesterday for therapeutic shoes. Will send an in basket message to the pcp asking for recommendations. Since seeing the pcp and starting on amitriptyline the patient is having less neuropathy pain. She is thankful for this. She  also has diabetic shoes that are new. She is going to see the podiatrist on Monday as she is concerned about some spots she has on her feet since wearing the shoes. Education provided. The patient is being proactive in her care. Review of things to monitor for. Sees the foot doctor next week.   Symptom Management: Take medications as prescribed   Attend all scheduled provider appointments Call provider office for new concerns or questions  call the Suicide and Crisis Lifeline: 988 call the [place] National Suicide Prevention Lifeline: 712-850-1539 or TTY: 631-664-8624 TTY (616) 341-0081) to talk to a trained counselor call 1-800-273-TALK (toll free, 24 hour hotline) if experiencing a Mental Health or Behavioral Health Crisis  keep appointment with eye doctor check feet daily for cuts, sores or redness trim toenails straight across manage portion size wash and dry feet carefully every day wear comfortable, cotton socks wear comfortable, well-fitting shoes  Follow Up Plan: Telephone follow up appointment with care management team member scheduled for: 11-17-2022 at 1145 am       RNCM Care Management Expected Outcome:  Monitor, Self-Manage and Reduce Symptoms of HLD       Current Barriers:  Chronic Disease Management support and education needs related to effective management of HLD Lab Results  Component Value Date   CHOL 149 09/14/2022   HDL 43 09/14/2022   LDLCALC 76 09/14/2022   TRIG 175 (H) 09/14/2022   CHOLHDL 3.5 09/14/2022     Planned Interventions: Provider established cholesterol goals reviewed. Review of labs. The patient is above goal on her triglycerides. Education and support given. ; Counseled on importance of regular laboratory monitoring as prescribed; Provided HLD educational materials; Reviewed role and benefits of statin for ASCVD risk reduction. States compliance with medications; Discussed strategies to manage statin-induced myalgias. The patient takes Lipitor 10  mg QD; Reviewed importance of limiting foods high in cholesterol. Education and support provided on a heart healthy/ADA diet. The patient tries to watch what she is eating. Is trying to eat more vegetables. In the past she has not liked vegetables ; Screening for signs and symptoms of depression related to chronic disease state;  Assessed social determinant of health barriers;   Symptom Management: Take medications as prescribed   Attend all scheduled provider appointments Call provider office for new concerns or questions  call the Suicide and Crisis Lifeline: 988 call the [place] National Suicide Prevention Lifeline: 272-563-4930 or TTY: 601-464-0699 TTY 209-852-0441) to talk to a trained counselor call 1-800-273-TALK (toll free, 24 hour hotline) if experiencing a Mental Health or Behavioral Health Crisis  - take all medications exactly as prescribed - call doctor with any symptoms you believe are related to your medicine - call doctor when you experience any new symptoms - go to all doctor appointments as scheduled - adhere to prescribed diet: heart healthy/ADA diet   Follow Up Plan: Telephone  follow up appointment with care management team member scheduled for: 11-17-2022 at 1145 am       RNCM Care Management Expected Outcome:  Monitor, Self-Manage and Reduce Symptoms of: Depression       Current Barriers:  Knowledge Deficits related to how to manage emotions and stress level in a patient with multiple chronic conditions including depression Care Coordination needs related to resources in the community for food insecurities and additional support for social worker support  in a patient with depression and increased stress level Chronic Disease Management support and education needs related to effective management of depression  Lacks caregiver support.   Planned Interventions: Evaluation of current treatment plan related to depression  and patient's adherence to plan as established  by provider. The patient states she has good days and bad days. She states that some days she feels like she can do anything but her health limits her in what she can do. Some days she does not feel like getting up out of the bed and sometimes that is because of the pain she has.She feels like she is stable at this time. She does know she has the option to see a psychiatrist. She says a lot of her issues stem from her husband leaving her after 18 years of marriage. Reflective listening and support given. She states that she is stressed out and is dealing with things with her brother and her dads estate. She states that she wants to know if there is legal aide assistance. The patient states that she just needs some guidance. Will collaborate with the SW on expressed needs.  Advised patient to call the office for changes in mood, anxiety, depression, stress level, or mental health needs  Provided education to patient re: doing mindfulness activities, journal writing, creative ways to help her when she is stressed out. The patient is doing much better and her feet are healing. She states that she is having good days and some bad days. Overall she is in good spirits and is thankful for the support of her providers. She sees providers on a regular basis.  Reviewed medications with patient and discussed compliance. States she is compliant with medications Collaborated with LCSW and scheduling care guides regarding the need for follow up by LCSW. The LCSW has worked with the patient before for depression and other needs Provided patient with resources and support for effective management of depression  educational materials related to managing her stress level and depression  Reviewed scheduled/upcoming provider appointments including 01-06-2023 at 820am Care Guide referral for food insecurities and scheduling follow up with LCSW Social Work referral for support and education needs related to depression and stress.  Ongoing support and education from the LCSW.  Discussed plans with patient for ongoing care management follow up and provided patient with direct contact information for care management team Advised patient to discuss changes in her depression and anxiety level, questions and concerns on how to best manage her chronic care needs due to limited support system with provider Screening for signs and symptoms of depression related to chronic disease state  Assessed social determinant of health barriers Review of coping skills and doing things that help her. Loneliness is one of the biggest things for her. Education and support given.   Symptom Management: Take medications as prescribed   Attend all scheduled provider appointments Call provider office for new concerns or questions  call the Suicide and Crisis Lifeline: 988 call the [place] National Suicide Prevention Lifeline:  (848)175-1056 or TTY: (585)400-8538 TTY 223-176-4509) to talk to a trained counselor call 1-800-273-TALK (toll free, 24 hour hotline) if experiencing a Mental Health or Behavioral Health Crisis   Follow Up Plan: Telephone follow up appointment with care management team member scheduled for: 11-17-2022 at 1145 am       RNCM Care Management Expected Outcome:  Monitor, Self-Manage, and Reduce Symptoms of Hypertension       Current Barriers:  Chronic Disease Management support and education needs related to effective management of HTN Lacks caregiver support.   BP Readings from Last 3 Encounters:  09/21/22 136/67  09/14/22 110/64  06/22/22 132/70    Planned Interventions: Evaluation of current treatment plan related to hypertension self management and patient's adherence to plan as established by provider. The patients blood pressures are a little on the low side. She denies any light headedness or dizziness. Has had adjustments in her medications. Blood pressures are more stable. The patient knows to call the provider for  changes. Education provided to call the provider for acute changes in blood pressures   Provided education to patient re: stroke prevention, s/s of heart attack and stroke; Reviewed prescribed diet heart  healthy/ADA diet. The patient is monitoring her dietary intake. Reviewed medications with patient and discussed importance of compliance. The patient states compliance with medications. Had recent changes in her blood pressure medications;  Discussed plans with patient for ongoing care management follow up and provided patient with direct contact information for care management team; Advised patient, providing education and rationale, to monitor blood pressure daily and record, calling PCP for findings outside established parameters;  Reviewed scheduled/upcoming provider appointments including: 01-06-2023 at 8 20am Advised patient to discuss changes in blood pressures and heart health with provider; Provided education on prescribed diet heart healthy/ADA diet ;  Discussed complications of poorly controlled blood pressure such as heart disease, stroke, circulatory complications, vision complications, kidney impairment, sexual dysfunction;  Screening for signs and symptoms of depression related to chronic disease state;  Assessed social determinant of health barriers;   Symptom Management: Take medications as prescribed   Attend all scheduled provider appointments Call provider office for new concerns or questions  call the Suicide and Crisis Lifeline: 988 call the [place] National Suicide Prevention Lifeline: 669-618-0223 or TTY: 901-623-0523 TTY 832-439-8092) to talk to a trained counselor call 1-800-273-TALK (toll free, 24 hour hotline) if experiencing a Mental Health or Behavioral Health Crisis  check blood pressure weekly learn about high blood pressure call doctor for signs and symptoms of high blood pressure develop an action plan for high blood pressure keep all doctor  appointments take medications for blood pressure exactly as prescribed report new symptoms to your doctor  Follow Up Plan: Telephone follow up appointment with care management team member scheduled for: 11-17-2022 at 1145 am           Consent to Services:  Patient was given information about care management services, agreed to services, and gave verbal consent to participate.   Plan: Telephone follow up appointment with care management team member scheduled for: 11-17-2022 at 1145 am  Alto Denver RN, MSN, CCM RN Care Manager  [facility]  Ambulatory Care Management  Direct Number: [phone]

## 2022-09-27 ENCOUNTER — Ambulatory Visit: Payer: PPO | Admitting: Podiatry

## 2022-10-04 ENCOUNTER — Ambulatory Visit: Payer: PPO | Admitting: Podiatry

## 2022-10-04 ENCOUNTER — Ambulatory Visit: Payer: Self-pay | Admitting: Licensed Clinical Social Worker

## 2022-10-04 DIAGNOSIS — L97511 Non-pressure chronic ulcer of other part of right foot limited to breakdown of skin: Secondary | ICD-10-CM | POA: Diagnosis not present

## 2022-10-04 DIAGNOSIS — E1142 Type 2 diabetes mellitus with diabetic polyneuropathy: Secondary | ICD-10-CM

## 2022-10-04 DIAGNOSIS — B351 Tinea unguium: Secondary | ICD-10-CM

## 2022-10-04 DIAGNOSIS — M79675 Pain in left toe(s): Secondary | ICD-10-CM

## 2022-10-04 DIAGNOSIS — M79674 Pain in right toe(s): Secondary | ICD-10-CM | POA: Diagnosis not present

## 2022-10-04 MED ORDER — DOXYCYCLINE HYCLATE 100 MG PO TABS: 100.0000 mg | ORAL_TABLET | Freq: Two times a day (BID) | ORAL | 0 refills | Status: AC

## 2022-10-04 MED ORDER — MUPIROCIN 2 % EX OINT: 1.0000 | TOPICAL_OINTMENT | Freq: Every day | CUTANEOUS | 0 refills | Status: DC

## 2022-10-04 NOTE — Patient Instructions (Signed)
Visit Information  Thank you for taking time to visit with me today. Please don't hesitate to contact me if I can be of assistance to you.   Following are the goals we discussed today:   Goals Addressed               This Visit's Progress     Patient Stated she has stress related to managing health issues. She has pain in her feet occasionally (pt-stated)        Interventions  Spoke with client via phone  today about client status and client's current needs Spoke with client about skin care issues Discussed mood of client. She is anxious in trying to manage her health needs faced . She is also anxious related  to managing health needs of her step mother. Reviewed program support for client  with RN, LCSW, Pharmacist Discussed DME of client. She has rollator walker she uses to help her walk. She has also requested that grab bars be put up in her bathroom at apartment complex. Client said she has back pain issues.  She has difficulty walking longer distances. She uses rollator to help her walk Reviewed support with PCP, [name]. Discussed family support. Client has some support from a friend . Reviewed medication procurement. Discussed transport needs of client.  She said she drives her car for short trips and errands. Reminded Blanch about [facility] support. Charlsey has talked with [facility] about transport support Provided counseling support for client Client said she had visit with foot specialist recently.  Reviewed energy level of client. She said she sometimes has low energy. Encouraged client to call LCSW for SW support as needed at [phone]. Miricle was appreciative of phone conversation with LCSW today          Our next appointment is by telephone on 11/23/22 at 10:30 AM   Please call the care guide team at [phone] if you need to cancel or reschedule your appointment.   If you are experiencing a Mental Health or Behavioral Health Crisis or need someone to talk to,  please go to [facility] [address] [place] [phone])   The patient verbalized understanding of instructions, educational materials, and care plan provided today and DECLINED offer to receive copy of patient instructions, educational materials, and care plan.   The patient has been provided with contact information for the care management team and has been advised to call with any health related questions or concerns.   Kelton Pillar.Islam Villescas MSW, LCSW Licensed Visual merchandiser [facility] Care Management [phone]

## 2022-10-04 NOTE — Patient Outreach (Signed)
Care Coordination   Follow Up Visit Note   10/04/2022 Name: Linda [name] MRN: [id number] DOB: 22-Mar-1970  Linda [name] is a 52 y.o. year old female who sees [name], Linda Mandes, MD for primary care. I spoke with  Linda [name] by phone today.  What matters to the patients health and wellness today? Patient Stated she has stress related to managing health issues. She has pain in her feet occasionally (pt-stated)     Goals Addressed               This Visit's Progress     Patient Stated she has stress related to managing health issues. She has pain in her feet occasionally (pt-stated)        Interventions  Spoke with client via phone  today about client status and client's current needs Spoke with client about skin care issues Discussed mood of client. She is anxious in trying to manage her health needs faced . She is also anxious related  to managing health needs of her step mother. Reviewed program support for client  with RN, LCSW, Pharmacist Discussed DME of client. She has rollator walker she uses to help her walk. She has also requested that grab bars be put up in her bathroom at apartment complex. Client said she has back pain issues.  She has difficulty walking longer distances. She uses rollator to help her walk Reviewed support with PCP, Dr. Blane [name]. Discussed family support. Client has some support from a friend . Reviewed medication procurement. Discussed transport needs of client.  She said she drives her car for short trips and errands. Reminded Linda [name] about [facility] support. Linda [name] has talked with [facility] about transport support Provided counseling support for client Client said she had visit with foot specialist recently.  Reviewed energy level of client. She said she sometimes has low energy. Encouraged client to call LCSW for SW support as needed at [phone]. Linda [name] was appreciative of phone conversation with LCSW today          SDOH  assessments and interventions completed:  Yes  SDOH Interventions Today    Flowsheet Row Most Recent Value  SDOH Interventions   Depression Interventions/Treatment  Counseling  Physical Activity Interventions Other (Comments)  [client uses a rollator walker to help her walk]  Stress Interventions Provide Counseling  [client has stress in managing her medical needs]        Care Coordination Interventions:  Yes, provided   Interventions Today    Flowsheet Row Most Recent Value  Chronic Disease   Chronic disease during today's visit Other  [spoke with client about client needs]  General Interventions   General Interventions Discussed/Reviewed General Interventions Discussed, Community Resources  Exercise Interventions   Exercise Discussed/Reviewed Physical Activity  [client uses rollator walker to help her walk]  Physical Activity Discussed/Reviewed Physical Activity Reviewed  Education Interventions   Education Provided Provided Education  Provided Verbal Education On Community Resources  Mental Health Interventions   Mental Health Discussed/Reviewed Coping Strategies  [client has anxiety in trying to manage her medical needs. she is anxious also related to helping to manage health needs of her step mother.]  Nutrition Interventions   Nutrition Discussed/Reviewed Nutrition Discussed  Pharmacy Interventions   Pharmacy Dicussed/Reviewed Pharmacy Topics Discussed  Safety Interventions   Safety Discussed/Reviewed Fall Risk        Follow up plan: Follow up call scheduled for 11/23/22 at 10:30 AM    Encounter Outcome:  Patient Visit Completed  [name] MSW, LCSW Licensed Visual merchandiser [facility] Care Management [phone]

## 2022-10-04 NOTE — Progress Notes (Signed)
  Subjective:  Patient ID: Linda [name], female    DOB: [birth year],  MRN: [id number]  Chief Complaint  Patient presents with   Nail Problem    Routine Foot Care-nail trim    Foot Pain    C/o shooting pain to the right heel radiating to medial aspect of right ankle and achilles tendon.     52 y.o. female presents with the above complaint. History confirmed with patient. Patient presenting with pain related to dystrophic thickened elongated nails. Patient is unable to trim own nails related to nail dystrophy and/or mobility issues. Patient does have a history of T2DM.  Patient also has concern for an ulceration on the right third toe.  Objective:  Physical Exam: warm, good capillary refill nail exam onychomycosis of the toenails DP pulses palpable, PT pulses palpable, and protective sensation absent Left Foot:  Pain with palpation of nails due to elongation and dystrophic growth.  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 Right Foot: Pain with palpation of nails due to elongation and dystrophic growth.  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  On the right third toe there is superficial ulceration limited to breakdown of skin the distal tuft of the third toe with some erythema surrounding.  No deep ulceration or drainage.  Wound measures 0.4 x 0.2 x 0.1 cm postdebridement    Assessment:   1. Right foot ulcer, limited to breakdown of skin (HCC)   2. Pain due to onychomycosis of toenails of both feet   3. DM type 2 with diabetic peripheral neuropathy (HCC)      Plan:  Patient was evaluated and treated and all questions answered.  Ulcer distal tuft of the right third toe limited to breakdown of skin -We discussed the etiology and factors that are a part of the wound healing process.  We also discussed the risk of infection both soft tissue and osteomyelitis from open ulceration.  Discussed the risk of limb loss if  this happens or worsens. -Debridement as below. -Dressed with mupirocin ointment, DSD. -Continue home dressing changes mupirocin ointment with Band-Aid and gel toe cap for offloading -Continue off-loading with gel toe cap to the third toe on the right foot. -Vascular testing deferred -Last antibiotics: eRx for doxycycline 100 mg twice daily for 10 days -Imaging: Deferred  Procedure: Excisional Debridement of Wound Rationale: Removal of non-viable soft tissue from the wound to promote healing.  Anesthesia: none Post-Debridement Wound Measurements: 0.4 cm x 0.2 cm x 0.1 cm  Type of Debridement: Sharp Excisional Tissue Removed: Non-viable soft tissue Depth of Debridement: subcutaneous tissue. Technique: Sharp excisional debridement to bleeding, viable wound base.  Dressing: Dry, sterile, compression dressing. Disposition: Patient tolerated procedure well.    #Onychomycosis with pain  -Nails palliatively debrided as below. -Educated on self-care  Procedure: Nail Debridement Rationale: Pain Type of Debridement: manual, sharp debridement. Instrumentation: Nail nipper, rotary burr. Number of Nails: 5  Return in about 3 weeks (around 10/25/2022) for f/u R 3rd toe ulcer.         Corinna Gab, DPM [facility]

## 2022-10-04 NOTE — Patient Outreach (Signed)
  Care Coordination   10/04/2022 Name: Linda [name] MRN: [id number] DOB: 1970-05-02   Care Coordination Outreach Attempts:  An unsuccessful telephone outreach was attempted today to offer the patient information about available care coordination services.  Follow Up Plan:  Additional outreach attempts will be made to offer the patient care coordination information and services.   Encounter Outcome:  No Answer   Care Coordination Interventions:  No, not indicated    Kelton Pillar.Donetta Isaza MSW, LCSW Licensed Visual merchandiser [facility] Care Management [phone]

## 2022-10-06 ENCOUNTER — Encounter: Payer: Self-pay | Admitting: Hematology and Oncology

## 2022-10-06 NOTE — Progress Notes (Signed)
[facility]  [address] 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 She was treated with bilateral mastectomy due to BRCA1 mutation.  She completed 4 cycles of Adriamycin/cyclophosphamide chemotherapy and received 8 out of 12 planned weeks of weekly paclitaxel.  She remains without evidence of recurrence.   BRCA1 positive Positive BRCA 1 mutation, which increases her risk for breast and ovarian cancer, as well as elevates her risk for pancreatic cancer.  She underwent hysterectomy/bilateral salpingo-oophorectomy.  The hepatologist recommended biannual screening for hepatocellular carcinoma with ultrasound alternating with cross-sectional imaging to allow screening for pancreatic cancer.  Right upper quadrant ultrasound in February revealed nodular hepatic contour with coarse hepatic echogenicity consistent with known cirrhosis.  No focal hepatic abnormality was identified. She had a CT abdomen in August 2023, and this is stable.  Repeat CT scans from August 2024 remains negative for malignancy.   Liver cirrhosis secondary to NASH (nonalcoholic steatohepatitis) (HCC) Liver cirrhosis as seen on CT imaging in August 2020.  She was referred to [facility], CRNP, at the [facility] in [place].  Hepatitis panel was negative.  She received the appropriate vaccines.  CT imaging in May 2022 was stable.  [name] recommended every 6 month screening for hepatocellular cancer with ultrasound alternating with cross-sectional imaging to allow for screening for pancreatic cancer.  Right upper quadrant ultrasound in  February did not reveal any focal hepatic abnormality.  CT imaging in August was to screen for hepatocellular cancer due to cirrhosis and we see 2 tiny hypodense liver lesions in the right lobe measuring 4 mm each and stable from September 2021.  The pancreas appears normal, we are screening due to her BRCA1 mutation.  Her current CT scan from September 19, 2022 reveals coarse nodular cirrhotic morphology of the liver but no focal liver lesions.  She also has splenomegaly and I think this may be symptomatic.  I will schedule an ultrasound of the liver in 6 months to monitor for any liver lesions.   Idiopathic thrombocytopenic purpura (ITP) (HCC) Thrombocytopenia, which is felt to be due to chronic ITP and liver cirrhosis.  She continues to have regular blood work at Dr. [name] office.  CBC in February revealed stable mild thrombocytopenia with a platelet count of 99,000.  Her platelet count today was back down to 74,000 last time but then improved to 91,000.  Today it is back down to 76,000.  The thrombocytopenia could also be due to her liver disease.  Worsening anemia Her hemoglobin has decreased to 10.7 and so I will check B12 and folate levels.  The MCV is 82.  Her white count is normal at 3.8 with a normal ANC.  She is already taking iron supplement.  We may want to recheck all of these levels again next year to see if she is absorbing properly.  Pulmonary nodule CT of the chest was done in November 2023 and showed resolution of the tree-in-bud nodularity in the posterior right lung base.  However she did have a new 6 mm left upper lobe pulmonary nodule.  This nodule has now resolved as of the CT scan from August 2024.  Multiple  toe amputations Her diabetes has not been well-controlled and she admits to an unhealthy diet.  She has now had multiple partial toe amputations in February 2024.  He has been followed by podiatry and the wound center.   Plan:.  We will flush her port today.  I think some of  her abdominal symptoms are related to hypersplenism and possible ascites.  I will check a B12 and folate to evaluate her worsening anemia and thrombocytopenia.  I will alternate visits with [name].  I will see her back in 3 months with CBC, CMP, PT/INR, and alpha-fetoprotein.  We will plan to flush her port at that time.  We can plan a liver ultrasound in 6 months, but I would recommend a CT scan yearly since she does have BRCA1 positivity and is at increased risk for pancreatic cancer.  The patient understands the plans discussed today and is in agreement with them.  She knows to contact our office if she develops concerns prior to her next appointment.   I provided 30 minutes of face-to-face time during this encounter and > 50% was spent counseling as documented under my assessment and plan.    [name], MD  [facility] AT [facility] [address] [place] [address] Dept: [phone] Dept Fax: [phone]   No orders of the defined types were placed in this encounter.     CHIEF COMPLAINT:  CC: Stage IB triple negative breast cancer  Current Treatment: Observation  HISTORY OF PRESENT ILLNESS:  Linda [name] is a 52 Y.O. female who we began seeing in February 2018 for evaluation of anemia.  Her anemia was felt to be secondary to iron deficiency and we recommended continuation of oral iron supplementation for a total of 6 months, as well as referral back to the gastroenterologist.  The patient also has a history of thrombocytopenia and had previously seen Dr. Melvyn Neth in 2014.  The thrombocytopenia was felt to be secondary to mild chronic immune thrombocytopenic purpura (ITP).  Due to the patient's family history of malignancy, she underwent testing for hereditary cancer syndromes with the My[employer] myRisk Hereditary Cancer Panel test.  This revealed a mutation in the BRCA 1 gene, which is associated with a significantly  increased risk for breast and ovarian cancer, and an elevated risk of pancreatic cancer.  She underwent a robotic hysterectomy and bilateral salpingo-oophorectomy in May 2018 with Dr. Adolphus Birchwood.  Pathology was benign.  We also discussed the option of risk reducing bilateral mastectomy, but she chose to have close surveillance, so we recommended annual MRI breast, in addition to annual mammography.  She was placed on chemoprevention with raloxifene in September 2018.  CT abdomen and pelvis in August 2020 done for bilateral flank pain, revealed a tiny left renal calculus, as well as probable hepatic cirrhosis without evidence of hepatic mass.  She was then referred to [facility] and is followed by Annamarie Major, CRNP in [place] for her liver cirrhosis.  She tested negative for hepatitis A, B, and C.  She received her hepatitis vaccines in 2020.   We saw her in September 2021 for a new diagnosis of stage IB (T1c N0 M0) triple negative right breast cancer.  She underwent screening bilateral mammogram on August 3rd which revealed possible masses in the right breast.  Diagnostic right mammogram and right breast ultrasound from August 18th confirmed suspicious masses in the right breast at 5 o'clock measuring  1.5 cm and 6 o'clock measuring 1.4 cm.  There was an indeterminate 4 mm with possible distortion in the outer right breast without sonographic correlate.  Right axillary ultrasound was normal.  She then underwent biopsies of both masses and surgical pathology from these procedures revealed  invasive ductal carcinoma, grade 3, with necrosis at 5 o'clock; and invasive ductal carcinoma, grade 1-2, with necrosis and focal myxoid change at 6 o'clock.  No DCIS was identified.  Breast prognostic profiles revealed HER2, and estrogen and progesterone receptors to be negative. Ki67 was 30% at the 5 o'clock mass, and 40% at 6 o'clock.  She underwent bilateral mastectomies on September 24th with  Dr. Georgiana Shore. Surgical pathology from this procedure revealed invasive ductal carcinoma, grade 3, with myxoid change, 19 mm, at 6 o'clock, and invasive ductal carcinoma, grade 3, and ductal carcinoma in situ, 19 mm, at 5 o'clock.  All margins were negative for invasive carcinoma or DCIS.  Two sentinel lymph nodes were negative for metastatic carcinoma (0/2). CT chest, abdomen and pelvis in September did not reveal any evidence of metastatic disease.  She started dose dense AC chemotherapy on October 25th , with plans to follow this with weekly paclitaxel for 12 doses.  She had significantly worsened anemia with a hemoglobin of 8.7, as well as pancytopenia, so the 4th cycle of chemotherapy was delayed, but she finally got her last dose on January 4th. Her anemia has slowly improved.  She started weekly paclitaxel on January 26th and has received 8 out of 12 planned doses.   She did have some expected pancytopenia.  She had pre-existing neuropathy of the feet and legs.     She was admitted to the hospital for osteomyelitis of the left 2nd toe in March 2022.  She was treated with empiric vancomycin and Zosyn.  Amputation was recommended but the patient refused.  Cultures returned with Klebsiella, sensitive to IV Rocephin, so long term antibiotics 2 g Q24H for continued for the next 6 weeks. At that time, we decided to discontinue weekly paclitaxel, as she had received 8/12 cycles and had significant neuropathy.  She has done fairly well since that time.  She has mild chronic thrombocytopenia felt to be chronic ITP, although, this could be secondary to her liver disease.  In February 2023, a liver ultrasound did not reveal any liver masses.  [name] recommended alternating ultrasound with cross-sectional imaging to allow for pancreatic cancer screening due to her BRCA1 mutation.  INTERVAL HISTORY:  Linda [name] is here today for repeat clinical assessment for Stage IB triple negative breast cancer, as well as follow-up  of thrombocytopenia.  She is also positive for BRCA1 and has a history of liver cirrhosis with cytopenias and splenomegaly.  She tells me her brother has been diagnosed with kidney cancer at age 60 and will be having surgery.  Her father had surgery for kidney cancer also and her mother died of breast cancer at age 75.  She also complains of fatigue and abdominal swelling which fluctuates, especially in the left upper quadrant.  She had to have surgery in February 2024 for partial toe amputation of both feet for 5 toes total.  CT chest results from 12/10/21 which showed resolution of tree-in-bud nodularity in the posterior right lung base and a 6mm left upper lobe pulmonary nodule, new since previous CTA chest 04/08/2020.Marland Kitchen  Her current CT scan from September 19, 2022 reveals resolution of this pulmonary nodule.  It also reveals coarse nodular cirrhotic morphology of  the liver but no focal liver lesions.  Her PCP [name] is also following her closely, with labs every 3 months..  She will have her port flushed today.  She denies signs of infection such as sore throat, sinus drainage, cough, or urinary symptoms.  She denies fevers or recurrent chills. She denies nausea, vomiting, chest pain, or cough. Her weight has increased 2 pounds.  REVIEW OF SYSTEMS:  Review of Systems  Constitutional:  Positive for fatigue. Negative for appetite change, chills, fever and unexpected weight change.  HENT:  Negative.  Negative for lump/mass, mouth sores and sore throat.   Eyes: Negative.   Respiratory: Negative.  Negative for chest tightness, cough, hemoptysis and wheezing.   Cardiovascular: Negative.  Negative for chest pain, leg swelling and palpitations.  Gastrointestinal:  Positive for abdominal distention. Negative for abdominal pain, blood in stool, constipation, diarrhea, nausea and vomiting.  Endocrine: Negative.   Genitourinary: Negative.  Negative for difficulty urinating, dysuria, frequency and hematuria.    Musculoskeletal:  Negative for back pain, flank pain, gait problem and myalgias.  Skin:  Positive for wound (left 2nd toe).       + bruising   Neurological:  Negative for dizziness, extremity weakness, gait problem, headaches, light-headedness, numbness, seizures and speech difficulty.  Hematological:  Negative for adenopathy. Bruises/bleeds easily.  Psychiatric/Behavioral: Negative.  Negative for depression and sleep disturbance. The patient is not nervous/anxious.      VITALS:  Blood pressure 136/67, pulse 97, temperature 97.9 F (36.6 C), temperature source Oral, resp. rate 18, height 5\' 2"  (1.575 m), weight 205 lb 4.8 oz (93.1 kg), last menstrual period 06/13/2009, SpO2 97%.  Wt Readings from Last 3 Encounters:  09/21/22 205 lb 4.8 oz (93.1 kg)  09/14/22 201 lb 9.6 oz (91.4 kg)  06/22/22 203 lb (92.1 kg)    Body mass index is 37.55 kg/m.  Performance status (ECOG): 1 - Symptomatic but completely ambulatory  PHYSICAL EXAM:  Physical Exam Constitutional:      General: She is not in acute distress.    Appearance: Normal appearance. She is normal weight.  HENT:     Head: Normocephalic.     Comments: Soft cystic-feeling lump at the vertex of her scalp which I feel is benign Eyes:     General: No scleral icterus.    Extraocular Movements: Extraocular movements intact.     Conjunctiva/sclera: Conjunctivae normal.     Pupils: Pupils are equal, round, and reactive to light.  Cardiovascular:     Rate and Rhythm: Normal rate and regular rhythm.     Pulses: Normal pulses.     Heart sounds: Normal heart sounds. No murmur heard.    No friction rub. No gallop.  Pulmonary:     Effort: Pulmonary effort is normal. No respiratory distress.     Breath sounds: Normal breath sounds.  Chest:  Breasts:    Right: Absent.     Left: Absent.     Comments: Both mastectomies scars have mild nodularity consistent with scar tissue.   Abdominal:     General: Bowel sounds are normal. There is  no distension.     Palpations: Abdomen is soft. There is no hepatomegaly, splenomegaly or mass.     Tenderness: There is no abdominal tenderness.     Comments: She has mild hepatomegaly and mild to moderate splenomegaly.  She may have some mild ascites.  Musculoskeletal:        General: Normal range of motion.     Cervical  back: Normal range of motion and neck supple.     Right lower leg: No edema.     Left lower leg: Edema (mild) present.     Comments: Resolving ecchymosis and resolving abrasion in right upper shoulder.  Lymphadenopathy:     Cervical: No cervical adenopathy.     Right cervical: No superficial, deep or posterior cervical adenopathy.    Left cervical: No superficial, deep or posterior cervical adenopathy.     Upper Body:     Right upper body: No supraclavicular, axillary or pectoral adenopathy.     Left upper body: No supraclavicular, axillary or pectoral adenopathy.  Skin:    General: Skin is warm and dry.     Findings: Abrasion present.     Comments: Fairly large abrasion of the posterior right elbow area. Does not appear to be infected.  Neurological:     General: No focal deficit present.     Mental Status: She is alert and oriented to person, place, and time. Mental status is at baseline.  Psychiatric:        Mood and Affect: Mood normal.        Behavior: Behavior normal.        Thought Content: Thought content normal.        Judgment: Judgment normal.    LABS:      Latest Ref Rng & Units 09/19/2022    9:45 AM 09/14/2022    8:29 AM 05/25/2022    8:37 AM  CBC  WBC  3.8     7.4  6.3   Hemoglobin 12.0 - 16.0 10.7     12.2  11.6   Hematocrit 36 - 46 33     40.1  37.9   Platelets 150 - 400 K/uL 76     118  107      This result is from an external source.      Latest Ref Rng & Units 09/19/2022    9:45 AM 09/14/2022    8:29 AM 05/25/2022    8:37 AM  CMP  Glucose 70 - 99 mg/dL  161  97   BUN 4 - 21 10     9  19    Creatinine 0.5 - 1.1 0.7     0.96  1.18    Sodium 137 - 147 139     144  142   Potassium 3.5 - 5.1 mEq/L 3.9     4.3  4.5   Chloride 99 - 108 103     103  104   CO2 13 - 22 26     24  21    Calcium 8.7 - 10.7 9.1     9.7  9.0   Total Protein 6.0 - 8.5 g/dL  7.0  6.7   Total Bilirubin 0.0 - 1.2 mg/dL  0.6  0.6   Alkaline Phos 25 - 125 74     98  112   AST 13 - 35 24     17  21    ALT 7 - 35 U/L 14     14  25       This result is from an external source.     No results found for: "CEA1", "CEA" / No results found for: "CEA1", "CEA" No results found for: "PSA1" No results found for: "WRU045" No results found for: "CAN125"  No results found for: "TOTALPROTELP", "ALBUMINELP", "A1GS", "A2GS", "BETS", "BETA2SER", "GAMS", "MSPIKE", "SPEI" Lab Results  Component Value Date   TIBC 312  05/20/2020   TIBC 300 01/13/2020   FERRITIN 98 05/20/2020   IRONPCTSAT 26 05/20/2020   IRONPCTSAT 24.3 01/13/2020   No results found for: "LDH"  STUDIES:  DG Foot Complete Right  Result Date: 09/13/2022 Please see detailed radiograph report in office note.     CT Chest w contrast 12/10/2021 Impression: Interval resolution of tree-in-bud nodularity seen in the posterior right lung base on abdomen CT 09/10/21. 6mm left upper lobe pulmonary nodule, new since previous CTA chest 04/08/2020. Features suggest that this may be atelectasis or infection/inflammatory etiology. Given the history of breast cancer, follow-up CT chest wo contrast in 3 months recommended to re-evaluate. Cirrhosis with probable splenomegaly.   CT Abdomen 09/10/2021   HISTORY:   Past Medical History:  Diagnosis Date   Acute postoperative respiratory insufficiency 04/17/2020   AKI (acute kidney injury) (HCC) 04/17/2020   Anemia    Anemia, unspecified    Anemia, unspecified    Anemia, unspecified    Anxiety    Arthritis    BRCA gene mutation positive    Chronic pain syndrome    Complication of anesthesia    Difficulty waking up   Depression    Diabetes mellitus  without complication (HCC)    type 2   Diabetic ulcer of toe of left foot associated with type 2 diabetes mellitus, with fat layer exposed (HCC) 03/27/2020   Gastritis    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 No longer has    Past Surgical History:  Procedure Laterality Date   AMPUTATION TOE Bilateral 03/04/2022   Procedure: AMPUTATION TOE RIGHT FOOT PARTIAL OR TOTAL GREAT TOE AND SECOND TOE, LEFT FOOT PARTIAL OR TOTAL GREAT TOE, SECOND AND THIRD TOE;  Surgeon: Pilar Plate, DPM;  Location: [facility] OR;  Service: Podiatry;  Laterality: Bilateral;   APPENDECTOMY     BACK SURGERY     x 3 lower disc   BILATERAL TOTAL MASTECTOMY WITH AXILLARY LYMPH NODE DISSECTION Bilateral 09/2019   CHOLECYSTECTOMY     nephrolithiasis     ROBOTIC ASSISTED TOTAL HYSTERECTOMY WITH BILATERAL SALPINGO OOPHERECTOMY Bilateral 06/14/2016   Procedure: ROBOTIC ASSISTED TOTAL HYSTERECTOMY WITH BILATERAL SALPINGO OOPHORECTOMY;  Surgeon: Cleda Mccreedy, MD;  Location: [facility] OR[facility];  Service: Gynecology;  Laterality: Bilateral;    Family History  Problem Relation Age of Onset   Cancer Mother        breast   CAD Father    Diabetes Father    Heart failure Father    Cancer Father        renal carcinoma   Kidney failure Father    Cancer Brother 74       renal carcinoma.   Stroke Paternal Grandmother     Social History:  reports that she has never smoked. She has never used smokeless tobacco. She reports that she does not drink  alcohol and does not use drugs.The patient is alone today.  Allergies:  Allergies  Allergen  Reactions   Codeine Shortness Of Breath and Other (See Comments)    Other reaction(s): SHOB  ask   Augmentin [Amoxicillin-Pot Clavulanate] Diarrhea   Celecoxib Other (See Comments)    Unknown reaction  Other reaction(s): Unknown  ask   Ezetimibe Other (See Comments)    Other reaction(s): Unknown   Ezetimibe-Simvastatin Other (See Comments)    Unknown reaction  Patient is not aware of an allergy to this medication, ask   Propranolol Other (See Comments)    Other reaction(s): Unknown   Propranolol Hcl Other (See Comments)    Unknown reaction  ask   Simvastatin Other (See Comments)    Other reaction(s): Unknown    Current Medications: Current Outpatient Medications  Medication Sig Dispense Refill   acetaminophen (TYLENOL) 500 MG tablet Take 500 mg by mouth every 6 (six) hours as needed for moderate pain or mild pain.     albuterol (VENTOLIN HFA) 108 (90 Base) MCG/ACT inhaler INHALE TWO PUFFS BY MOUTH INTO LUNGS every SIX hours orn FOR WHEEZING AND/OR SHORTNESS OF BREATH 8.5 g 1   amitriptyline (ELAVIL) 25 MG tablet TAKE ONE TABLET BY MOUTH EVERYDAY AT BEDTIME 90 tablet 1   atorvastatin (LIPITOR) 10 MG tablet TAKE ONE TABLET BY MOUTH EVERY EVENING 90 tablet 1   Bilberry, Vaccinium myrtillus, (BILBERRY PO) Take 450 mg by mouth 2 (two) times daily.     Blood Glucose Monitoring Suppl (ONETOUCH VERIO REFLECT) w/Device KIT AS DIRECTED     Blood Pressure Monitoring (SPHYGMOMANOMETER) MISC 1 each by Does not apply route daily in the afternoon. 1 each 0   buPROPion (WELLBUTRIN XL) 300 MG 24 hr tablet Take 1 tablet (300 mg total) by mouth every morning. 90 tablet 3   busPIRone (BUSPAR) 5 MG tablet Take 1 tablet (5 mg total) by mouth 3 (three) times daily. 90 tablet 5   clotrimazole-betamethasone (LOTRISONE) cream Apply twice daily as needed to area of concern 45 g 2   Continuous Blood Gluc  Receiver (FREESTYLE LIBRE 2 READER) DEVI E11.69 Check blood sugar 4 times daily as directed 1 each 0   Continuous Glucose Sensor (FREESTYLE LIBRE 2 SENSOR) MISC USE TO check blood glucose AS DIRECTED AND CHANGE sensor every 14 DAYS 6 each 3   cyclobenzaprine (FLEXERIL) 10 MG tablet TAKE ONE TABLET BY MOUTH every EIGHT hours AS NEEDED FOR MUSCLE SPASMS 270 tablet 1   dicyclomine (BENTYL) 20 MG tablet TAKE ONE TABLET BY MOUTH BEFORE meals AND AT bedtime as needed FOR stomach cramping. 180 tablet 1   doxycycline (VIBRA-TABS) 100 MG tablet Take 1 tablet (100 mg total) by mouth 2 (two) times daily for 10 days. 20 tablet 0   ferrous sulfate 325 (65 FE) MG tablet Take 325 mg by mouth every evening.     gabapentin (NEURONTIN) 400 MG capsule Take 2 capsules (800 mg total) by mouth 3 (three) times daily. 180 capsule 1   Insulin Pen Needle (BD PEN NEEDLE NANO U/F) 32G X 4 MM MISC Use new needle with each injection. 30 each 2   Lancets (ONETOUCH DELICA PLUS LANCET30G) MISC USE TO check blood glucose 2-3 times daily AS DIRECTED 100 each 3   Levomilnacipran HCl ER (FETZIMA) 80 MG CP24 Take 1 capsule by mouth every evening. 90 capsule 1   levothyroxine (SYNTHROID) 75 MCG tablet Take 1 tablet (75 mcg total) by mouth daily before breakfast. 90 tablet 3   losartan (COZAAR) 50 MG tablet Take 1 tablet (50 mg total) by mouth every evening.  90 tablet 0   LYCOPENE PO Take by mouth.     Magnesium 500 MG CAPS Take 1 capsule (500 mg total) by mouth daily. 90 capsule 1   morphine (MS CONTIN) 30 MG 12 hr tablet TAKE ONE TABLET BY MOUTH EVERY TWELVE HOURS 60 tablet 0   MOUNJARO 7.5 MG/0.5ML Pen Inject 7.5mg  into THE SKIN ONCE WEEKLY 6 mL 0   Multiple Vitamin (MULTIVITAMIN WITH MINERALS) TABS tablet Take 1 tablet by mouth daily. Solar ray     mupirocin ointment (BACTROBAN) 2 % Apply 1 Application topically daily. 22 g 0   omega-3 acid ethyl esters (LOVAZA) 1 g capsule TAKE TWO CAPSULES BY MOUTH TWICE DAILY 360 capsule 1    ondansetron (ZOFRAN) 4 MG tablet Take 1 tablet (4 mg total) by mouth every 4 (four) hours as needed for nausea. 90 tablet 3   OVER THE COUNTER MEDICATION Take 1 tablet by mouth in the morning and at bedtime. Lutein for eyes     pantoprazole (PROTONIX) 40 MG tablet Take 1 tablet (40 mg total) by mouth 2 (two) times daily. 180 tablet 1   polyethylene glycol (MIRALAX / GLYCOLAX) 17 g packet Take by mouth.     potassium chloride (MICRO-K) 10 MEQ CR capsule Take 2 capsules (20 mEq total) by mouth 2 (two) times daily. 360 capsule 3   Probiotic Product (PROBIOTIC PO) Take 1 capsule by mouth in the morning. 3.2 billion cfu     prochlorperazine (COMPAZINE) 5 MG tablet TAKE ONE TABLET BY MOUTH every SIX hours AS NEEDED FOR NAUSEA AND VOMITING 30 tablet 1   ramelteon (ROZEREM) 8 MG tablet Take 1 tablet (8 mg total) by mouth at bedtime. 90 tablet 1   rOPINIRole (REQUIP) 0.25 MG tablet TAKE ONE TABLET BY MOUTH EVERYDAY AT BEDTIME 90 tablet 1   Sod Fluoride-Potassium Nitrate (SODIUM FLUORIDE 5000 SENSITIVE) 1.1-5 % GEL BRUSH ONE bead gently ON teeth FOR TWO MINUTES TWICE DAILY     SYNJARDY 12.05-998 MG TABS TAKE ONE TABLET BY MOUTH TWICE DAILY 180 tablet 1   TRESIBA FLEXTOUCH 200 UNIT/ML FlexTouch Pen Inject 60 units into THE SKIN daily AS DIRECTED 9 mL 1   Vitamin D, Ergocalciferol, (DRISDOL) 1.25 MG (50000 UNIT) CAPS capsule TAKE ONE CAPSULE BY MOUTH ONCE WEEKLY ON FRIDAY 12 capsule 1   VRAYLAR 3 MG capsule TAKE ONE CAPSULE BY MOUTH ONCE daily 90 capsule 1   VYVANSE 70 MG capsule TAKE ONE CAPSULE BY MOUTH EVERY MORNING 30 capsule 0   Current Facility-Administered Medications  Medication Dose Route Frequency Provider Last Rate Last Admin   sodium chloride flush (NS) 0.9 % injection 10 mL  10 mL Intracatheter PRN Mosher, Kelli A, PA-C   10 mL at 09/21/22 1035   Facility-Administered Medications Ordered in Other Visits  Medication Dose Route Frequency Provider Last Rate Last Admin   sodium chloride flush (NS)  0.9 % injection 10 mL  10 mL Intracatheter PRN Mosher, Harvin Hazel A, PA-C   10 mL at 02/19/21 1004    I,Alexis Herring,acting as a scribe for [name], MD.,have documented all relevant documentation on the behalf of [name], MD,as directed by  [name], MD while in the presence of [name], MD.  I have reviewed this report as typed by the medical scribe, and it is complete and accurate.  [name]   10/06/22 7:08 PM

## 2022-10-11 ENCOUNTER — Telehealth: Payer: Self-pay

## 2022-10-11 NOTE — Telephone Encounter (Signed)
Patient notified of results and to start taking folate and B12 supplement.

## 2022-10-11 NOTE — Telephone Encounter (Signed)
-----   Message from [name] sent at 10/06/2022  7:23 PM EDT ----- Regarding: Call Tell her the B12 and folate levels are low normal and so she may benefit from taking an oral supplement.  I recommend folate 1 daily and B12 1 daily of 500 mcg.

## 2022-10-13 ENCOUNTER — Ambulatory Visit (INDEPENDENT_AMBULATORY_CARE_PROVIDER_SITE_OTHER): Payer: PPO | Admitting: Family Medicine

## 2022-10-13 ENCOUNTER — Other Ambulatory Visit: Payer: Self-pay | Admitting: Family Medicine

## 2022-10-13 VITALS — BP 128/80 | HR 97 | Temp 96.8°F | Ht 62.0 in | Wt 206.4 lb

## 2022-10-13 DIAGNOSIS — Z0000 Encounter for general adult medical examination without abnormal findings: Secondary | ICD-10-CM

## 2022-10-13 DIAGNOSIS — K7460 Unspecified cirrhosis of liver: Secondary | ICD-10-CM

## 2022-10-13 DIAGNOSIS — Z23 Encounter for immunization: Secondary | ICD-10-CM

## 2022-10-13 DIAGNOSIS — E1140 Type 2 diabetes mellitus with diabetic neuropathy, unspecified: Secondary | ICD-10-CM

## 2022-10-13 DIAGNOSIS — F5081 Binge eating disorder: Secondary | ICD-10-CM

## 2022-10-13 DIAGNOSIS — I10 Essential (primary) hypertension: Secondary | ICD-10-CM

## 2022-10-13 NOTE — Assessment & Plan Note (Signed)
Continue to work on diet and exercise as tolerated.  Recommended to call to make a dentist appointment when she can. Offered her diabetes education class on 10/17/22 and she declined.

## 2022-10-13 NOTE — Progress Notes (Signed)
Subjective:   Linda [name] is a 52 y.o. female who presents for Medicare Annual (Subsequent) preventive examination.  Visit Complete: In person  Patient Medicare AWV questionnaire was completed by the patient; I have confirmed that all information answered by patient is correct and no changes since this date.   Review of Systems  Constitutional:  Negative for chills, fever and malaise/fatigue.  HENT:  Negative for ear pain, sinus pain and sore throat.   Respiratory:  Negative for cough and shortness of breath.   Cardiovascular:  Negative for chest pain.  Musculoskeletal:  Positive for back pain. Negative for myalgias.  Neurological:  Negative for headaches.    Cardiac Risk Factors include: dyslipidemia;diabetes mellitus     Objective:    Today's Vitals   10/13/22 0809 10/13/22 0828  BP: 128/80   Pulse: 97   Temp: (!) 96.8 F (36 C)   SpO2: 97%   Weight: 206 lb 6.4 oz (93.6 kg)   Height: 5\' 2"  (1.575 m)   PainSc:  7    Body mass index is 37.75 kg/m.  Physical Exam Constitutional:      General: She is not in acute distress.    Appearance: Normal appearance. She is not ill-appearing.  Eyes:     Conjunctiva/sclera: Conjunctivae normal.  Cardiovascular:     Rate and Rhythm: Regular rhythm.     Heart sounds: Normal heart sounds. No murmur heard. Pulmonary:     Effort: Pulmonary effort is normal.     Breath sounds: Normal breath sounds. No wheezing.  Abdominal:     General: Bowel sounds are normal.     Palpations: Abdomen is soft.     Tenderness: There is no abdominal tenderness.  Skin:    General: Skin is warm.  Neurological:     Mental Status: She is alert.     Gait: Gait abnormal (foot surgery - Feb. still painful).  Psychiatric:        Mood and Affect: Mood normal.         10/13/2022    8:11 AM 04/21/2022    9:24 AM 03/04/2022    6:48 AM 02/19/2021   10:09 AM 12/23/2019    2:04 PM 06/08/2018    6:39 PM 07/06/2016    2:51 PM  Advanced  Directives  Does Patient Have a Medical Advance Directive? Yes Yes Yes No No No No  Type of Estate agent of [place];Living will Living will Living will      Does patient want to make changes to medical advance directive? No - Patient declined        Copy of Healthcare Power of Attorney in Chart? No - copy requested        Would patient like information on creating a medical advance directive?     No - Patient declined  Yes (MAU/Ambulatory/Procedural Areas - Information given)    Current Medications (verified) Outpatient Encounter Medications as of 10/13/2022  Medication Sig   acetaminophen (TYLENOL) 500 MG tablet Take 500 mg by mouth every 6 (six) hours as needed for moderate pain or mild pain.   albuterol (VENTOLIN HFA) 108 (90 Base) MCG/ACT inhaler INHALE TWO PUFFS BY MOUTH INTO LUNGS every SIX hours orn FOR WHEEZING AND/OR SHORTNESS OF BREATH   amitriptyline (ELAVIL) 25 MG tablet TAKE ONE TABLET BY MOUTH EVERYDAY AT BEDTIME   atorvastatin (LIPITOR) 10 MG tablet TAKE ONE TABLET BY MOUTH EVERY EVENING   Bilberry, Vaccinium myrtillus, (BILBERRY PO) Take 450 mg by  mouth 2 (two) times daily.   buPROPion (WELLBUTRIN XL) 300 MG 24 hr tablet Take 1 tablet (300 mg total) by mouth every morning.   busPIRone (BUSPAR) 5 MG tablet Take 1 tablet (5 mg total) by mouth 3 (three) times daily.   clotrimazole-betamethasone (LOTRISONE) cream Apply twice daily as needed to area of concern   cyclobenzaprine (FLEXERIL) 10 MG tablet TAKE ONE TABLET BY MOUTH every EIGHT hours AS NEEDED FOR MUSCLE SPASMS   dicyclomine (BENTYL) 20 MG tablet TAKE ONE TABLET BY MOUTH BEFORE meals AND AT bedtime as needed FOR stomach cramping.   doxycycline (VIBRA-TABS) 100 MG tablet Take 1 tablet (100 mg total) by mouth 2 (two) times daily for 10 days.   ferrous sulfate 325 (65 FE) MG tablet Take 325 mg by mouth every evening.   gabapentin (NEURONTIN) 400 MG capsule Take 2 capsules (800 mg total) by mouth 3  (three) times daily.   Levomilnacipran HCl ER (FETZIMA) 80 MG CP24 Take 1 capsule by mouth every evening.   levothyroxine (SYNTHROID) 75 MCG tablet Take 1 tablet (75 mcg total) by mouth daily before breakfast.   losartan (COZAAR) 50 MG tablet Take 1 tablet (50 mg total) by mouth every evening.   LYCOPENE PO Take by mouth.   Magnesium 500 MG CAPS Take 1 capsule (500 mg total) by mouth daily.   morphine (MS CONTIN) 30 MG 12 hr tablet TAKE ONE TABLET BY MOUTH EVERY TWELVE HOURS   MOUNJARO 7.5 MG/0.5ML Pen Inject 7.5mg  into THE SKIN ONCE WEEKLY   Multiple Vitamin (MULTIVITAMIN WITH MINERALS) TABS tablet Take 1 tablet by mouth daily. Solar ray   mupirocin ointment (BACTROBAN) 2 % Apply 1 Application topically daily.   omega-3 acid ethyl esters (LOVAZA) 1 g capsule TAKE TWO CAPSULES BY MOUTH TWICE DAILY   ondansetron (ZOFRAN) 4 MG tablet Take 1 tablet (4 mg total) by mouth every 4 (four) hours as needed for nausea.   OVER THE COUNTER MEDICATION Take 1 tablet by mouth in the morning and at bedtime. Lutein for eyes   pantoprazole (PROTONIX) 40 MG tablet Take 1 tablet (40 mg total) by mouth 2 (two) times daily.   polyethylene glycol (MIRALAX / GLYCOLAX) 17 g packet Take by mouth.   potassium chloride (MICRO-K) 10 MEQ CR capsule Take 2 capsules (20 mEq total) by mouth 2 (two) times daily.   Probiotic Product (PROBIOTIC PO) Take 1 capsule by mouth in the morning. 3.2 billion cfu   prochlorperazine (COMPAZINE) 5 MG tablet TAKE ONE TABLET BY MOUTH every SIX hours AS NEEDED FOR NAUSEA AND VOMITING   ramelteon (ROZEREM) 8 MG tablet Take 1 tablet (8 mg total) by mouth at bedtime.   rOPINIRole (REQUIP) 0.25 MG tablet TAKE ONE TABLET BY MOUTH EVERYDAY AT BEDTIME   SYNJARDY 12.05-998 MG TABS TAKE ONE TABLET BY MOUTH TWICE DAILY   Vitamin D, Ergocalciferol, (DRISDOL) 1.25 MG (50000 UNIT) CAPS capsule TAKE ONE CAPSULE BY MOUTH ONCE WEEKLY ON FRIDAY   VRAYLAR 3 MG capsule TAKE ONE CAPSULE BY MOUTH ONCE daily    VYVANSE 70 MG capsule TAKE ONE CAPSULE BY MOUTH EVERY MORNING   Blood Glucose Monitoring Suppl (ONETOUCH VERIO REFLECT) w/Device KIT AS DIRECTED   Blood Pressure Monitoring (SPHYGMOMANOMETER) MISC 1 each by Does not apply route daily in the afternoon.   Continuous Blood Gluc Receiver (FREESTYLE LIBRE 2 READER) DEVI E11.69 Check blood sugar 4 times daily as directed   Continuous Glucose Sensor (FREESTYLE LIBRE 2 SENSOR) MISC USE TO check blood  glucose AS DIRECTED AND CHANGE sensor every 14 DAYS   Insulin Pen Needle (BD PEN NEEDLE NANO U/F) 32G X 4 MM MISC Use new needle with each injection.   Lancets (ONETOUCH DELICA PLUS LANCET30G) MISC USE TO check blood glucose 2-3 times daily AS DIRECTED   Sod Fluoride-Potassium Nitrate (SODIUM FLUORIDE 5000 SENSITIVE) 1.1-5 % GEL BRUSH ONE bead gently ON teeth FOR TWO MINUTES TWICE DAILY   TRESIBA FLEXTOUCH 200 UNIT/ML FlexTouch Pen Inject 60 units into THE SKIN daily AS DIRECTED   Facility-Administered Encounter Medications as of 10/13/2022  Medication   sodium chloride flush (NS) 0.9 % injection 10 mL    Allergies (verified) Codeine, Augmentin [amoxicillin-pot clavulanate], Celecoxib, Ezetimibe, Ezetimibe-simvastatin, Propranolol, Propranolol hcl, and Simvastatin   History: Past Medical History:  Diagnosis Date   Acute postoperative respiratory insufficiency 04/17/2020   AKI (acute kidney injury) (HCC) 04/17/2020   Anemia    Anemia, unspecified    Anemia, unspecified    Anemia, unspecified    Anxiety    Arthritis    BRCA gene mutation positive    Chronic pain syndrome    Complication of anesthesia    Difficulty waking up   Depression    Diabetes mellitus without complication (HCC)    type 2   Diabetic ulcer of toe of left foot associated with type 2 diabetes mellitus, with fat layer exposed (HCC) 03/27/2020   Gastritis    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 No longer has   Past Surgical History:  Procedure Laterality Date   AMPUTATION TOE Bilateral 03/04/2022   Procedure: AMPUTATION TOE RIGHT FOOT PARTIAL OR TOTAL GREAT TOE AND SECOND TOE, LEFT FOOT PARTIAL OR TOTAL GREAT TOE, SECOND AND THIRD TOE;  Surgeon: Pilar Plate, DPM;  Location: [facility];  Service: Podiatry;  Laterality: Bilateral;   APPENDECTOMY     BACK SURGERY     x 3 lower disc   BILATERAL TOTAL MASTECTOMY WITH AXILLARY LYMPH NODE DISSECTION Bilateral 09/2019   CHOLECYSTECTOMY     nephrolithiasis     ROBOTIC ASSISTED TOTAL HYSTERECTOMY WITH BILATERAL SALPINGO OOPHERECTOMY Bilateral 06/14/2016   Procedure: ROBOTIC ASSISTED TOTAL HYSTERECTOMY WITH BILATERAL SALPINGO OOPHORECTOMY;  Surgeon: Cleda Mccreedy, MD;  Location: [facility];  Service: Gynecology;  Laterality: Bilateral;   Family History  Problem Relation Age of Onset   Cancer Mother        breast   CAD Father    Diabetes Father    Heart failure Father    Cancer Father        renal carcinoma   Kidney failure Father    Cancer Brother 5       renal carcinoma.   Stroke Paternal Grandmother    Social History   Socioeconomic History   Marital status: Divorced    Spouse name: Not on file   Number of children: Not on file   Years of education: Not on file   Highest education  level: Associate degree: occupational, Scientist, product/process development, or vocational program  Occupational History   Occupation: disabled  Tobacco Use   Smoking status: Never   Smokeless tobacco: Never  Substance and Sexual Activity   Alcohol use: No    Drug use: No   Sexual activity: Yes  Other Topics Concern   Not on file  Social History Narrative   wears sunscreen, brushes and flosses daily, see's dentist bi-annually, has smoke/carbon monoxide detectors, wears a seatbelt and practices gun safety   Social Determinants of Health   Financial Resource Strain: Medium Risk (09/13/2022)   Overall Financial Resource Strain (CARDIA)    Difficulty of Paying Living Expenses: Somewhat hard  Food Insecurity: Food Insecurity Present (09/13/2022)   Hunger Vital Sign    Worried About Running Out of Food in the Last Year: Sometimes true    Ran Out of Food in the Last Year: Never true  Transportation Needs: No Transportation Needs (09/13/2022)   PRAPARE - Administrator, Civil Service (Medical): No    Lack of Transportation (Non-Medical): No  Physical Activity: Inactive (10/04/2022)   Exercise Vital Sign    Days of Exercise per Week: 0 days    Minutes of Exercise per Session: 0 min  Stress: Stress Concern Present (10/04/2022)   [employer] of Occupational Health - Occupational Stress Questionnaire    Feeling of Stress : Rather much  Social Connections: Moderately Isolated (09/13/2022)   Social Connection and Isolation Panel [NHANES]    Frequency of Communication with Friends and Family: More than three times a week    Frequency of Social Gatherings with Friends and Family: Once a week    Attends Religious Services: More than 4 times per year    Active Member of [employer] or Organizations: No    Attends Engineer, structural: Never    Marital Status: Divorced    Tobacco Counseling Counseling given: Not Answered  Clinical Intake:  Pre-visit preparation completed: Yes  Pain : 0-10 Pain Score: 7  Pain Type: Acute pain Pain Location: Foot Pain Orientation: Right Pain Radiating Towards: right ankle Pain Descriptors / Indicators: Sore, Numbness Pain Onset: More than a month ago (Surgery to foot in Feb.) Pain Frequency:  Constant Pain Relieving Factors: pain medication Effect of Pain on Daily Activities: yes, only able to do a little at a time, has to sit down  Pain Relieving Factors: pain medication  Nutritional Status: BMI > 30  Obese Diabetes: Yes CBG done?: Yes (105) Did pt. bring in CBG monitor from home?: No  Activities of Daily Living: Independent Ambulation: Independent Medication Administration: Independent Home Management: Independent  Psychosocial Barriers: Financial need     How often do you need to have someone help you when you read instructions, pamphlets, or other written materials from your doctor or pharmacy?: 1 - Never  Interpreter Needed?: No    Activities of Daily Living    10/13/2022    8:11 AM 03/04/2022    7:01 AM  In your present state of health, do you have any difficulty performing the following activities:  Hearing? 0   Vision? 0   Difficulty concentrating or making decisions? 0   Walking or climbing stairs? 0   Dressing or bathing? 0   Doing errands, shopping? 0 0  Preparing Food and eating ? N   Using the Toilet? N   In the past six months, have you accidently leaked urine? N   Do you have problems with loss of bowel control?  N   Managing your Medications? N   Managing your Finances? N   Housekeeping or managing your Housekeeping? N     Patient Care Team: [name], MD as PCP - General (Internal Medicine) [name], MD as Consulting Physician (Oncology) Marlowe Sax, RN as Case Manager (General Practice)  Indicate any recent Medical Services you may have received from other than [facility] providers in the past year (date may be approximate).     Assessment:   This is a routine wellness examination for Linda [name].  Hearing/Vision screen No results found.   Depression Screen    10/04/2022    3:49 PM 09/14/2022    8:01 AM 08/17/2022   10:17 AM 06/21/2022    3:48 PM 05/25/2022    7:45 AM 05/04/2022    8:53 AM 04/04/2022   11:18 AM  PHQ 2/9  Scores  PHQ - 2 Score 2 2 2 4 6 2 2   PHQ- 9 Score 6 10 8 14 22 8 9     Fall Risk    09/22/2022   12:21 PM 09/14/2022    8:01 AM 05/04/2022    8:49 AM 04/13/2022    1:45 PM 03/01/2022   10:01 AM  Fall Risk   Falls in the past year? 1 1  1 1   Number falls in past yr: 0 0  1 1  Injury with Fall? 0 0  1 1  Risk for fall due to : History of fall(s);Impaired balance/gait;Impaired mobility Impaired balance/gait Impaired balance/gait Impaired balance/gait;Impaired mobility No Fall Risks  Follow up Falls evaluation completed;Education provided;Falls prevention discussed Falls evaluation completed;Falls prevention discussed  Falls evaluation completed;Education provided;Falls prevention discussed;Follow up appointment Falls evaluation completed    MEDICARE RISK AT HOME:    TIMED UP AND GO:  Was the test performed?  Yes  Length of time to ambulate 10 feet: 8 sec Gait slow and steady without use of assistive device    Cognitive Function:    07/08/2019    9:43 AM  MMSE - Mini Mental State Exam  Orientation to time 5  Orientation to Place 5  Registration 3  Attention/ Calculation 5  Recall 3  Language- name 2 objects 2  Language- repeat 1  Language- follow 3 step command 3  Language- read & follow direction 1  Write a sentence 1  Copy design 1  Total score 30        Immunizations Immunization History  Administered Date(s) Administered   Hepatitis B 07/10/2019   Hepatitis B, ADULT 03/11/2020   Influenza Inj Mdck Quad Pf 10/09/2019, 11/16/2020, 10/01/2021   Influenza, Mdck, Trivalent,PF 6+ MOS(egg free) 10/13/2022   Influenza-Unspecified 05/05/2013, 09/14/2018   PFIZER Comirnaty(Gray Top)Covid-19 Tri-Sucrose Vaccine 07/24/2020   PFIZER(Purple Top)SARS-COV-2 Vaccination 04/18/2019, 05/13/2019, 01/24/2020   Pfizer Covid-19 Vaccine Bivalent Booster 64yrs & up 11/16/2020   Pneumococcal Polysaccharide-23 04/27/2012, 05/05/2013   Tdap 07/14/2021    Screening Tests Health  Maintenance  Topic Date Due   Zoster Vaccines- Shingrix (1 of 2) Never done   OPHTHALMOLOGY EXAM  05/28/2022   COVID-19 Vaccine (6 - 2023-24 season) 09/25/2022   FOOT EXAM  01/07/2023   HEMOGLOBIN A1C  03/17/2023   Diabetic kidney evaluation - Urine ACR  05/25/2023   Diabetic kidney evaluation - eGFR measurement  09/19/2023   Medicare Annual Wellness (AWV)  10/13/2023   Colonoscopy  06/09/2026   DTaP/Tdap/Td (2 - Td or Tdap) 07/15/2031   INFLUENZA VACCINE  Completed   HPV VACCINES  Aged Out  MAMMOGRAM  Discontinued   Hepatitis C Screening  Discontinued   HIV Screening  Discontinued    Health Maintenance  Health Maintenance Due  Topic Date Due   Zoster Vaccines- Shingrix (1 of 2) Never done   OPHTHALMOLOGY EXAM  05/28/2022   COVID-19 Vaccine (6 - 2023-24 season) 09/25/2022     Additional Screening:   Vision Screening: Recommended annual ophthalmology exams for early detection of glaucoma and other disorders of the eye. Is the patient up to date with their annual eye exam?  Yes  Who is the provider or what is the name of the office in which the patient attends annual eye exams?  If pt is not established with a provider, would they like to be referred to a provider to establish care? No .   Dental Screening: Recommended annual dental exams for proper oral hygiene  Diabetic Foot Exam: Diabetic Foot Exam: Completed    Community Resource Referral / Chronic Care Management: CRR required this visit?  No   CCM required this visit?  No   Plan:    Regis was seen today for medicare avw.  Encounter for Medicare annual wellness exam Assessment & Plan: Continue to work on diet and exercise as tolerated.  Recommended to call to make a dentist appointment when she can. Offered her diabetes education class on 10/17/22 and she declined.     Encounter for immunization Assessment & Plan: Continue to work on diet and exercise as tolerated.  Recommended to call to make a dentist  appointment when she can. Offered her diabetes education class on 10/17/22 and she declined.    Orders: -     Influenza, MDCK, trivalent, PF(Flucelvax egg-free)    I have personally reviewed and noted the following in the patient's chart:   Medical and social history Use of alcohol, tobacco or illicit drugs  Current medications and supplements including opioid prescriptions. Patient is currently taking opioid prescriptions. Information provided to patient regarding non-opioid alternatives. Patient advised to discuss non-opioid treatment plan with their provider. Functional ability and status Nutritional status Physical activity Advanced directives List of other physicians Hospitalizations, surgeries, and ER visits in previous 12 months Vitals Screenings to include cognitive, depression, and falls Referrals and appointments  In addition, I have reviewed and discussed with patient certain preventive protocols, quality metrics, and best practice recommendations. A written personalized care plan for preventive services as well as general preventive health recommendations were provided to patient.    Lajuana Matte, FNP [name] [phone]

## 2022-10-25 ENCOUNTER — Ambulatory Visit: Payer: PPO | Admitting: Podiatry

## 2022-10-25 DIAGNOSIS — Z89412 Acquired absence of left great toe: Secondary | ICD-10-CM

## 2022-10-25 DIAGNOSIS — L97511 Non-pressure chronic ulcer of other part of right foot limited to breakdown of skin: Secondary | ICD-10-CM

## 2022-10-25 DIAGNOSIS — E1142 Type 2 diabetes mellitus with diabetic polyneuropathy: Secondary | ICD-10-CM

## 2022-10-25 DIAGNOSIS — Z89411 Acquired absence of right great toe: Secondary | ICD-10-CM

## 2022-10-25 DIAGNOSIS — M2042 Other hammer toe(s) (acquired), left foot: Secondary | ICD-10-CM

## 2022-10-25 NOTE — Progress Notes (Signed)
Subjective:  Patient ID: Linda [name], female    DOB: 07/03/1970,  MRN: [id number]  Chief Complaint  Patient presents with   Wound Check    f/u right 3rd toe ulcer.     52 y.o. female presents with the above complaint. History confirmed with patient. Patient presenting with concern for follow-up on right third toe ulceration.  She thinks area is healed has not been any drainage from it.  Objective:  Physical Exam: warm, good capillary refill nail exam onychomycosis of the toenails DP pulses palpable, PT pulses palpable, and protective sensation absent Left Foot:  Pain with palpation of nails due to elongation and dystrophic growth.  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 Right Foot: Pain with palpation of nails due to elongation and dystrophic growth.  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  On the right third toe there is healthy tissue and mild hyperkeratotic tissue at site of prior ulceration no open wound at this time    Assessment:   1. Right foot ulcer, limited to breakdown of skin (HCC)   2. DM type 2 with diabetic peripheral neuropathy (HCC)   3. Hammertoe of left foot   4. 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  Ulcer distal tuft of the right third toe limited to breakdown of skin -Fully healed after wound care with mupirocin ointment and offloading measures with gel toe caps -Continue monitoring. -Debrided callus around the nails as well as the thickened dystrophic nails x 5 at this visit as a courtesy     Return in about 9 weeks (around 12/27/2022).         Corinna Gab, DPM [facility] / [facility]

## 2022-10-28 ENCOUNTER — Other Ambulatory Visit: Payer: Self-pay | Admitting: Family Medicine

## 2022-10-28 DIAGNOSIS — E1140 Type 2 diabetes mellitus with diabetic neuropathy, unspecified: Secondary | ICD-10-CM

## 2022-11-01 DIAGNOSIS — M48062 Spinal stenosis, lumbar region with neurogenic claudication: Secondary | ICD-10-CM | POA: Diagnosis not present

## 2022-11-03 DIAGNOSIS — E1142 Type 2 diabetes mellitus with diabetic polyneuropathy: Secondary | ICD-10-CM | POA: Diagnosis not present

## 2022-11-08 ENCOUNTER — Ambulatory Visit: Payer: PPO | Admitting: Podiatry

## 2022-11-08 ENCOUNTER — Ambulatory Visit: Payer: PPO

## 2022-11-08 ENCOUNTER — Encounter: Payer: Self-pay | Admitting: Podiatry

## 2022-11-08 DIAGNOSIS — L97519 Non-pressure chronic ulcer of other part of right foot with unspecified severity: Secondary | ICD-10-CM

## 2022-11-08 DIAGNOSIS — E1142 Type 2 diabetes mellitus with diabetic polyneuropathy: Secondary | ICD-10-CM

## 2022-11-08 DIAGNOSIS — Z89412 Acquired absence of left great toe: Secondary | ICD-10-CM | POA: Diagnosis not present

## 2022-11-08 DIAGNOSIS — L97512 Non-pressure chronic ulcer of other part of right foot with fat layer exposed: Secondary | ICD-10-CM | POA: Diagnosis not present

## 2022-11-08 DIAGNOSIS — Z89411 Acquired absence of right great toe: Secondary | ICD-10-CM

## 2022-11-08 NOTE — Progress Notes (Signed)
Subjective:  Patient ID: Linda [name], female    DOB: February 17, 1970,  MRN: [id number]  Chief Complaint  Patient presents with   Foot Ulcer    Rm 1- Right foot third toe ulcer-  has been more red than usual -  toe has been having cramps where the toe draws up for about a week.  States it has been oozing still.   Has been putting mupirocin ointment on the toe.  States it seems the toe has gotten worse.  Denies N/V/F/C/NS-  at times the foot feels hot.      52 y.o. female presents with the above complaint. History confirmed with patient. Patient presenting with concern for follow-up on right third toe ulceration.  Patient thinks the wound is slightly more red than usual has had some drainage.  Objective:  Physical Exam: warm, good capillary refill nail exam onychomycosis of the toenails DP pulses palpable, PT pulses palpable, and protective sensation absent Left Foot:  Pain with palpation of nails due to elongation and dystrophic growth.  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 Right Foot: Pain with palpation of nails due to elongation and dystrophic growth.  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    Ulceration present at the distal aspect of the third toe on the right foot to the subcutaneous fat tissue level.  Mild erythema and edema of the distal aspect of the third toe.    11/08/2022 right foot XR 3 views AP lateral oblique weightbearing.  Findings: Attention directed to the cut portion of the proximal phalanx of the right hallux there is noted be irregularities and possibly erosions at the amputation margin.  On the third toe distal phalanx there is questionable osteolysis at the distal tuft of the distal phalanx specimen on oblique view.  Assessment:   1. Skin ulcer of third toe of right foot with fat layer exposed (HCC)   2. 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 DM type 2 with diabetic  peripheral neuropathy (HCC)       Plan:  Patient was evaluated and treated and all questions answered.  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  We also discussed the risk of infection both soft tissue and osteomyelitis from open ulceration.  Discussed the risk of limb loss if this happens or worsens. -Debridement deferred -Continue home dressing changes daily with mupirocin ointment and gauze bandage or Band-Aid -Continue off-loading with surgical shoe -Vascular testing deferred -HgbA1c: 6.5 -Last antibiotics: Currently no indication for antibiotics as the infection is likely chronic and there is no acute worsening of infection at this time -Imaging: x-ray reviewed, shows possible erosion or infection especially in the right hallux remaining proximal phalanx at the amputation margin as well as in the distal phalanx of the third toe. Will order MRI to further evaluate. -MRI of the right foot ordered to evaluate for residual osteomyelitis in the distal aspect of the proximal phalanx of the right hallux as well as in the distal phalanx of the third toe  Return in about 3 weeks (around 11/29/2022) for f/u R foot MRi results.         Corinna Gab, DPM [facility] / [facility]

## 2022-11-11 ENCOUNTER — Other Ambulatory Visit: Payer: Self-pay | Admitting: Family Medicine

## 2022-11-11 DIAGNOSIS — R600 Localized edema: Secondary | ICD-10-CM | POA: Diagnosis not present

## 2022-11-11 DIAGNOSIS — M48061 Spinal stenosis, lumbar region without neurogenic claudication: Secondary | ICD-10-CM | POA: Diagnosis not present

## 2022-11-11 DIAGNOSIS — L97519 Non-pressure chronic ulcer of other part of right foot with unspecified severity: Secondary | ICD-10-CM | POA: Diagnosis not present

## 2022-11-11 DIAGNOSIS — K7460 Unspecified cirrhosis of liver: Secondary | ICD-10-CM

## 2022-11-11 DIAGNOSIS — M48062 Spinal stenosis, lumbar region with neurogenic claudication: Secondary | ICD-10-CM | POA: Diagnosis not present

## 2022-11-11 DIAGNOSIS — M79604 Pain in right leg: Secondary | ICD-10-CM | POA: Diagnosis not present

## 2022-11-11 DIAGNOSIS — F50819 Binge eating disorder, unspecified: Secondary | ICD-10-CM

## 2022-11-11 DIAGNOSIS — M19071 Primary osteoarthritis, right ankle and foot: Secondary | ICD-10-CM | POA: Diagnosis not present

## 2022-11-11 DIAGNOSIS — M5127 Other intervertebral disc displacement, lumbosacral region: Secondary | ICD-10-CM | POA: Diagnosis not present

## 2022-11-11 DIAGNOSIS — E1140 Type 2 diabetes mellitus with diabetic neuropathy, unspecified: Secondary | ICD-10-CM

## 2022-11-11 DIAGNOSIS — M47816 Spondylosis without myelopathy or radiculopathy, lumbar region: Secondary | ICD-10-CM | POA: Diagnosis not present

## 2022-11-11 DIAGNOSIS — M79674 Pain in right toe(s): Secondary | ICD-10-CM | POA: Diagnosis not present

## 2022-11-11 DIAGNOSIS — M51369 Other intervertebral disc degeneration, lumbar region without mention of lumbar back pain or lower extremity pain: Secondary | ICD-10-CM | POA: Diagnosis not present

## 2022-11-17 ENCOUNTER — Other Ambulatory Visit: Payer: Self-pay

## 2022-11-17 NOTE — Patient Instructions (Signed)
Visit Information  Thank you for taking time to visit with me today. Please don't hesitate to contact me if I can be of assistance to you before our next scheduled telephone appointment.  Following are the goals we discussed today:   Goals Addressed             This Visit's Progress    RNCM Care Management Expected Outcome:  Monitor, Self-Manage and Reduce Symptoms of Diabetes       Current Barriers:  Knowledge Deficits related to concerns about places on her toes and not being able to be seen by podiatrist until next week, the patient expressed concern over being diabetic and not wanting to lose her toes Care Coordination needs related to resources in the area to help with her expressed needs and caring for her self due to lack of support system in a patient with DM Chronic Disease Management support and education needs related to effective management of DM Lacks caregiver support.  Lab Results  Component Value Date   HGBA1C 6.5 (H) 09/14/2022     Planned Interventions: Provided education to patient about basic DM disease process. The patient states that her blood sugars have been up and down. The patient is under goal with her A1c states she is doing well with DM management. Blood sugars have been up and down. She has been stressed out about her feet and not knowing what is going on with her toes.  Reviewed medications with patient and discussed importance of medication adherence. The patient is compliant with her medications. Denies any acute changes in her medications. ;        Reviewed prescribed diet with patient heart healthy/ADA diet. Is compliant with her diet; Counseled on importance of regular laboratory monitoring as prescribed. Has regular labwork ;        Discussed plans with patient for ongoing care management follow up and provided patient with direct contact information for care management team;      Provided patient with written educational materials related to hypo and  hyperglycemia and importance of correct treatment. The lowest the patient has seen is 100 and the highest is > 200 ;       Reviewed scheduled/upcoming provider appointments including: 01-06-2023 at 820am with the pcp;         Advised patient, providing education and rationale, to check cbg when you have symptoms of low or high blood sugar and has a continuous glucose reader, freestyle [place]  and record. The patient states that her blood sugars have been up and down. Average blood sugars have been 180's.The patient says she knows its a little elevated at times. She states that she is a stress eater and eats things she should not sometimes. Education and support given.  call provider for findings outside established parameters;       Referral made to pharmacy team for assistance with ongoing support and education for medication needs ;       Referral made to social work team for assistance with ongoing support and education for effective management of stress, anxiety, depression ;      Referral made to community resources care guide team for assistance with food resources and community resources;      Review of patient status, including review of consultants reports, relevant laboratory and other test results, and medications completed;       Advised patient to discuss changes in her DM and questions and concerns with provider;  Screening for signs and symptoms of depression related to chronic disease state;        Assessed social determinant of health barriers;        The patient states she is having more pain in her legs and feet and it is sometimes unbearable. The patient states that she has had a change in her medications but cannot tell that it has helped. Ask for recommendations. Was fitted yesterday for therapeutic shoes. She is seeing the specialist and is following up next week with the specialist. Will continue to monitor.    Symptom Management: Take medications as prescribed   Attend all  scheduled provider appointments Call provider office for new concerns or questions  call the Suicide and Crisis Lifeline: 988 call the [place] National Suicide Prevention Lifeline: 323 243 4231 or TTY: 947-224-1936 TTY 717-412-2606) to talk to a trained counselor call 1-800-273-TALK (toll free, 24 hour hotline) if experiencing a Mental Health or Behavioral Health Crisis  keep appointment with eye doctor check feet daily for cuts, sores or redness trim toenails straight across manage portion size wash and dry feet carefully every day wear comfortable, cotton socks wear comfortable, well-fitting shoes  Follow Up Plan: Telephone follow up appointment with care management team member scheduled for: 01-12-2023 at 1145 am       RNCM Care Management Expected Outcome:  Monitor, Self-Manage and Reduce Symptoms of HLD       Current Barriers:  Chronic Disease Management support and education needs related to effective management of HLD Lab Results  Component Value Date   CHOL 149 09/14/2022   HDL 43 09/14/2022   LDLCALC 76 09/14/2022   TRIG 175 (H) 09/14/2022   CHOLHDL 3.5 09/14/2022     Planned Interventions: Provider established cholesterol goals reviewed. Review of labs. The patient is above goal on her triglycerides. Education and support given. ; Counseled on importance of regular laboratory monitoring as prescribed. ; Provided HLD educational materials; Reviewed role and benefits of statin for ASCVD risk reduction. States compliance with medications; Discussed strategies to manage statin-induced myalgias. The patient takes Lipitor 10 mg QD; Reviewed importance of limiting foods high in cholesterol. Education and support provided on a heart healthy/ADA diet. The patient tries to watch what she is eating. Is trying to eat more vegetables. In the past she has not liked vegetables ; Screening for signs and symptoms of depression related to chronic disease state;  Assessed social  determinant of health barriers;   Symptom Management: Take medications as prescribed   Attend all scheduled provider appointments Call provider office for new concerns or questions  call the Suicide and Crisis Lifeline: 988 call the [place] National Suicide Prevention Lifeline: 845 450 1334 or TTY: (484)052-4668 TTY 410 517 2649) to talk to a trained counselor call 1-800-273-TALK (toll free, 24 hour hotline) if experiencing a Mental Health or Behavioral Health Crisis  - take all medications exactly as prescribed - call doctor with any symptoms you believe are related to your medicine - call doctor when you experience any new symptoms - go to all doctor appointments as scheduled - adhere to prescribed diet: heart healthy/ADA diet   Follow Up Plan: Telephone follow up appointment with care management team member scheduled for: 01-12-2023 at 1145 am       RNCM Care Management Expected Outcome:  Monitor, Self-Manage and Reduce Symptoms of: Depression       Current Barriers:  Knowledge Deficits related to how to manage emotions and stress level in a patient with multiple chronic conditions including  depression Care Coordination needs related to resources in the community for food insecurities and additional support for social worker support  in a patient with depression and increased stress level Chronic Disease Management support and education needs related to effective management of depression  Lacks caregiver support.   Planned Interventions: Evaluation of current treatment plan related to depression  and patient's adherence to plan as established by provider. The patient states she has good days and bad days. She states that some days she feels like she can do anything but her health limits her in what she can do. Some days she does not feel like getting up out of the bed and sometimes that is because of the pain she has.She feels like she is stable at this time. She does know she has the  option to see a psychiatrist. She says a lot of her issues stem from her husband leaving her after 18 years of marriage. The patient states that she is helping her neighbor and that is helping her.  Advised patient to call the office for changes in mood, anxiety, depression, stress level, or mental health needs  Provided education to patient re: doing mindfulness activities, journal writing, creative ways to help her when she is stressed out. The patient is doing much better and her feet are healing. She states that she is having good days and some bad days. Overall she is in good spirits and is thankful for the support of her providers. She sees providers on a regular basis.  Reviewed medications with patient and discussed compliance. States she is compliant with medications Collaborated with LCSW and scheduling care guides regarding the need for follow up by LCSW. The LCSW has worked with the patient before for depression and other needs. She is working with the SW for additional and ongoing support.  Provided patient with resources and support for effective management of depression  educational materials related to managing her stress level and depression  Reviewed scheduled/upcoming provider appointments including 01-06-2023 at 820am Care Guide referral for food insecurities and scheduling follow up with LCSW Social Work referral for support and education needs related to depression and stress. Ongoing support and education from the LCSW.  Discussed plans with patient for ongoing care management follow up and provided patient with direct contact information for care management team Advised patient to discuss changes in her depression and anxiety level, questions and concerns on how to best manage her chronic care needs due to limited support system with provider Screening for signs and symptoms of depression related to chronic disease state  Assessed social determinant of health barriers Review of  coping skills and doing things that help her. Loneliness is one of the biggest things for her. Education and support given.   Symptom Management: Take medications as prescribed   Attend all scheduled provider appointments Call provider office for new concerns or questions  call the Suicide and Crisis Lifeline: 988 call the [place] National Suicide Prevention Lifeline: 773-879-7706 or TTY: 703-474-8952 TTY (276)662-1972) to talk to a trained counselor call 1-800-273-TALK (toll free, 24 hour hotline) if experiencing a Mental Health or Behavioral Health Crisis   Follow Up Plan: Telephone follow up appointment with care management team member scheduled for: 01-12-2023 at 1145 am       RNCM Care Management Expected Outcome:  Monitor, Self-Manage, and Reduce Symptoms of Hypertension       Current Barriers:  Chronic Disease Management support and education needs related to effective management of HTN  Lacks caregiver support.   BP Readings from Last 3 Encounters:  10/13/22 128/80  09/21/22 136/67  09/14/22 110/64    Planned Interventions: Evaluation of current treatment plan related to hypertension self management and patient's adherence to plan as established by provider.  She denies any light headedness or dizziness. Has had adjustments in her medications. Blood pressures are more stable. The patient knows to call the provider for changes. Education provided to call the provider for acute changes in blood pressures   Provided education to patient re: stroke prevention, s/s of heart attack and stroke; Reviewed prescribed diet heart  healthy/ADA diet. The patient is monitoring her dietary intake. Reviewed medications with patient and discussed importance of compliance. The patient states compliance with medications. Had recent changes in her blood pressure medications;  Discussed plans with patient for ongoing care management follow up and provided patient with direct contact information for care  management team; Advised patient, providing education and rationale, to monitor blood pressure daily and record, calling PCP for findings outside established parameters;  Reviewed scheduled/upcoming provider appointments including: 01-06-2023 at 8 20am Advised patient to discuss changes in blood pressures and heart health with provider; Provided education on prescribed diet heart healthy/ADA diet ;  Discussed complications of poorly controlled blood pressure such as heart disease, stroke, circulatory complications, vision complications, kidney impairment, sexual dysfunction;  Screening for signs and symptoms of depression related to chronic disease state;  Assessed social determinant of health barriers;   Symptom Management: Take medications as prescribed   Attend all scheduled provider appointments Call provider office for new concerns or questions  call the Suicide and Crisis Lifeline: 988 call the [place] National Suicide Prevention Lifeline: 5740330076 or TTY: 684 063 9226 TTY 226 375 7691) to talk to a trained counselor call 1-800-273-TALK (toll free, 24 hour hotline) if experiencing a Mental Health or Behavioral Health Crisis  check blood pressure weekly learn about high blood pressure call doctor for signs and symptoms of high blood pressure develop an action plan for high blood pressure keep all doctor appointments take medications for blood pressure exactly as prescribed report new symptoms to your doctor  Follow Up Plan: Telephone follow up appointment with care management team member scheduled for: 01-12-2023 at 1145 am           Our next appointment is by telephone on 01-12-2023 at 1145 am  Please call the care guide team at [phone] if you need to cancel or reschedule your appointment.   If you are experiencing a Mental Health or Behavioral Health Crisis or need someone to talk to, please call the Suicide and Crisis Lifeline: 988 call the [place] National Suicide  Prevention Lifeline: (606)684-9371 or TTY: 830-763-0801 TTY 604 688 9209) to talk to a trained counselor call 1-800-273-TALK (toll free, 24 hour hotline) go to [facility] [address] [place] [phone])   Patient verbalizes understanding of instructions and care plan provided today and agrees to view in MyChart. Active MyChart status and patient understanding of how to access instructions and care plan via MyChart confirmed with patient.       Alto Denver RN, MSN, CCM RN Care Manager  [facility]  Ambulatory Care Management  Direct Number: [phone]

## 2022-11-17 NOTE — Patient Outreach (Signed)
Care Management   Visit Note  11/17/2022 Name: Linda [name] MRN: [id number] DOB: 1970/04/11  Subjective: Linda [name] is a 52 y.o. year old female who is a primary care patient of [name], Kirsten, MD. The Care Management team was consulted for assistance.      Engaged with patient spoke with patient by telephone.    Goals Addressed             This Visit's Progress    RNCM Care Management Expected Outcome:  Monitor, Self-Manage and Reduce Symptoms of Diabetes       Current Barriers:  Knowledge Deficits related to concerns about places on her toes and not being able to be seen by podiatrist until next week, the patient expressed concern over being diabetic and not wanting to lose her toes Care Coordination needs related to resources in the area to help with her expressed needs and caring for her self due to lack of support system in a patient with DM Chronic Disease Management support and education needs related to effective management of DM Lacks caregiver support.  Lab Results  Component Value Date   HGBA1C 6.5 (H) 09/14/2022     Planned Interventions: Provided education to patient about basic DM disease process. The patient states that her blood sugars have been up and down. The patient is under goal with her A1c states she is doing well with DM management. Blood sugars have been up and down. She has been stressed out about her feet and not knowing what is going on with her toes.  Reviewed medications with patient and discussed importance of medication adherence. The patient is compliant with her medications. Denies any acute changes in her medications. ;        Reviewed prescribed diet with patient heart healthy/ADA diet. Is compliant with her diet; Counseled on importance of regular laboratory monitoring as prescribed. Has regular labwork ;        Discussed plans with patient for ongoing care management follow up and provided patient with direct contact  information for care management team;      Provided patient with written educational materials related to hypo and hyperglycemia and importance of correct treatment. The lowest the patient has seen is 100 and the highest is > 200 ;       Reviewed scheduled/upcoming provider appointments including: 01-06-2023 at 820am with the pcp;         Advised patient, providing education and rationale, to check cbg when you have symptoms of low or high blood sugar and has a continuous glucose reader, freestyle Wyatt  and record. The patient states that her blood sugars have been up and down. Average blood sugars have been 180's.The patient says she knows its a little elevated at times. She states that she is a stress eater and eats things she should not sometimes. Education and support given.  call provider for findings outside established parameters;       Referral made to pharmacy team for assistance with ongoing support and education for medication needs ;       Referral made to social work team for assistance with ongoing support and education for effective management of stress, anxiety, depression ;      Referral made to community resources care guide team for assistance with food resources and community resources;      Review of patient status, including review of consultants reports, relevant laboratory and other test results, and medications completed;  Advised patient to discuss changes in her DM and questions and concerns with provider;      Screening for signs and symptoms of depression related to chronic disease state;        Assessed social determinant of health barriers;        The patient states she is having more pain in her legs and feet and it is sometimes unbearable. The patient states that she has had a change in her medications but cannot tell that it has helped. Ask for recommendations. Was fitted yesterday for therapeutic shoes. She is seeing the specialist and is following up next week  with the specialist. Will continue to monitor.    Symptom Management: Take medications as prescribed   Attend all scheduled provider appointments Call provider office for new concerns or questions  call the Suicide and Crisis Lifeline: 988 call the [place] National Suicide Prevention Lifeline: (712) 168-3764 or TTY: 6025516895 TTY (313)034-3371) to talk to a trained counselor call 1-800-273-TALK (toll free, 24 hour hotline) if experiencing a Mental Health or Behavioral Health Crisis  keep appointment with eye doctor check feet daily for cuts, sores or redness trim toenails straight across manage portion size wash and dry feet carefully every day wear comfortable, cotton socks wear comfortable, well-fitting shoes  Follow Up Plan: Telephone follow up appointment with care management team member scheduled for: 01-12-2023 at 1145 am       RNCM Care Management Expected Outcome:  Monitor, Self-Manage and Reduce Symptoms of HLD       Current Barriers:  Chronic Disease Management support and education needs related to effective management of HLD Lab Results  Component Value Date   CHOL 149 09/14/2022   HDL 43 09/14/2022   LDLCALC 76 09/14/2022   TRIG 175 (H) 09/14/2022   CHOLHDL 3.5 09/14/2022     Planned Interventions: Provider established cholesterol goals reviewed. Review of labs. The patient is above goal on her triglycerides. Education and support given. ; Counseled on importance of regular laboratory monitoring as prescribed. ; Provided HLD educational materials; Reviewed role and benefits of statin for ASCVD risk reduction. States compliance with medications; Discussed strategies to manage statin-induced myalgias. The patient takes Lipitor 10 mg QD; Reviewed importance of limiting foods high in cholesterol. Education and support provided on a heart healthy/ADA diet. The patient tries to watch what she is eating. Is trying to eat more vegetables. In the past she has not liked  vegetables ; Screening for signs and symptoms of depression related to chronic disease state;  Assessed social determinant of health barriers;   Symptom Management: Take medications as prescribed   Attend all scheduled provider appointments Call provider office for new concerns or questions  call the Suicide and Crisis Lifeline: 988 call the [place] National Suicide Prevention Lifeline: 219-677-5157 or TTY: 334-531-4605 TTY 431 101 5449) to talk to a trained counselor call 1-800-273-TALK (toll free, 24 hour hotline) if experiencing a Mental Health or Behavioral Health Crisis  - take all medications exactly as prescribed - call doctor with any symptoms you believe are related to your medicine - call doctor when you experience any new symptoms - go to all doctor appointments as scheduled - adhere to prescribed diet: heart healthy/ADA diet   Follow Up Plan: Telephone follow up appointment with care management team member scheduled for: 01-12-2023 at 1145 am       RNCM Care Management Expected Outcome:  Monitor, Self-Manage and Reduce Symptoms of: Depression       Current Barriers:  Knowledge Deficits related to how to manage emotions and stress level in a patient with multiple chronic conditions including depression Care Coordination needs related to resources in the community for food insecurities and additional support for social worker support  in a patient with depression and increased stress level Chronic Disease Management support and education needs related to effective management of depression  Lacks caregiver support.   Planned Interventions: Evaluation of current treatment plan related to depression  and patient's adherence to plan as established by provider. The patient states she has good days and bad days. She states that some days she feels like she can do anything but her health limits her in what she can do. Some days she does not feel like getting up out of the bed and  sometimes that is because of the pain she has.She feels like she is stable at this time. She does know she has the option to see a psychiatrist. She says a lot of her issues stem from her husband leaving her after 18 years of marriage. The patient states that she is helping her neighbor and that is helping her.  Advised patient to call the office for changes in mood, anxiety, depression, stress level, or mental health needs  Provided education to patient re: doing mindfulness activities, journal writing, creative ways to help her when she is stressed out. The patient is doing much better and her feet are healing. She states that she is having good days and some bad days. Overall she is in good spirits and is thankful for the support of her providers. She sees providers on a regular basis.  Reviewed medications with patient and discussed compliance. States she is compliant with medications Collaborated with LCSW and scheduling care guides regarding the need for follow up by LCSW. The LCSW has worked with the patient before for depression and other needs. She is working with the SW for additional and ongoing support.  Provided patient with resources and support for effective management of depression  educational materials related to managing her stress level and depression  Reviewed scheduled/upcoming provider appointments including 01-06-2023 at 820am Care Guide referral for food insecurities and scheduling follow up with LCSW Social Work referral for support and education needs related to depression and stress. Ongoing support and education from the LCSW.  Discussed plans with patient for ongoing care management follow up and provided patient with direct contact information for care management team Advised patient to discuss changes in her depression and anxiety level, questions and concerns on how to best manage her chronic care needs due to limited support system with provider Screening for signs and  symptoms of depression related to chronic disease state  Assessed social determinant of health barriers Review of coping skills and doing things that help her. Loneliness is one of the biggest things for her. Education and support given.   Symptom Management: Take medications as prescribed   Attend all scheduled provider appointments Call provider office for new concerns or questions  call the Suicide and Crisis Lifeline: 988 call the [place] National Suicide Prevention Lifeline: 323-135-6161 or TTY: 506 012 1419 TTY (219)741-1863) to talk to a trained counselor call 1-800-273-TALK (toll free, 24 hour hotline) if experiencing a Mental Health or Behavioral Health Crisis   Follow Up Plan: Telephone follow up appointment with care management team member scheduled for: 01-12-2023 at 1145 am       RNCM Care Management Expected Outcome:  Monitor, Self-Manage, and Reduce Symptoms of Hypertension  Current Barriers:  Chronic Disease Management support and education needs related to effective management of HTN Lacks caregiver support.   BP Readings from Last 3 Encounters:  10/13/22 128/80  09/21/22 136/67  09/14/22 110/64    Planned Interventions: Evaluation of current treatment plan related to hypertension self management and patient's adherence to plan as established by provider.  She denies any light headedness or dizziness. Has had adjustments in her medications. Blood pressures are more stable. The patient knows to call the provider for changes. Education provided to call the provider for acute changes in blood pressures   Provided education to patient re: stroke prevention, s/s of heart attack and stroke; Reviewed prescribed diet heart  healthy/ADA diet. The patient is monitoring her dietary intake. Reviewed medications with patient and discussed importance of compliance. The patient states compliance with medications. Had recent changes in her blood pressure medications;  Discussed  plans with patient for ongoing care management follow up and provided patient with direct contact information for care management team; Advised patient, providing education and rationale, to monitor blood pressure daily and record, calling PCP for findings outside established parameters;  Reviewed scheduled/upcoming provider appointments including: 01-06-2023 at 8 20am Advised patient to discuss changes in blood pressures and heart health with provider; Provided education on prescribed diet heart healthy/ADA diet ;  Discussed complications of poorly controlled blood pressure such as heart disease, stroke, circulatory complications, vision complications, kidney impairment, sexual dysfunction;  Screening for signs and symptoms of depression related to chronic disease state;  Assessed social determinant of health barriers;   Symptom Management: Take medications as prescribed   Attend all scheduled provider appointments Call provider office for new concerns or questions  call the Suicide and Crisis Lifeline: 988 call the [place] National Suicide Prevention Lifeline: (419)348-8901 or TTY: 410-657-1296 TTY 386-490-4566) to talk to a trained counselor call 1-800-273-TALK (toll free, 24 hour hotline) if experiencing a Mental Health or Behavioral Health Crisis  check blood pressure weekly learn about high blood pressure call doctor for signs and symptoms of high blood pressure develop an action plan for high blood pressure keep all doctor appointments take medications for blood pressure exactly as prescribed report new symptoms to your doctor  Follow Up Plan: Telephone follow up appointment with care management team member scheduled for: 01-12-2023 at 1145 am           Consent to Services:  Patient was given information about care management services, agreed to services, and gave verbal consent to participate.   Plan: Telephone follow up appointment with care management team member scheduled  for: 01-12-2023 at 1145 am   Alto Denver RN, MSN, CCM RN Care Manager  [facility]  Ambulatory Care Management  Direct Number: [phone]

## 2022-11-22 ENCOUNTER — Other Ambulatory Visit: Payer: Self-pay | Admitting: Podiatry

## 2022-11-24 ENCOUNTER — Other Ambulatory Visit: Payer: Self-pay | Admitting: Family Medicine

## 2022-11-27 ENCOUNTER — Other Ambulatory Visit: Payer: Self-pay | Admitting: Family Medicine

## 2022-11-28 DIAGNOSIS — I700 Atherosclerosis of aorta: Secondary | ICD-10-CM | POA: Diagnosis not present

## 2022-11-28 DIAGNOSIS — Z7985 Long-term (current) use of injectable non-insulin antidiabetic drugs: Secondary | ICD-10-CM | POA: Diagnosis not present

## 2022-11-28 DIAGNOSIS — G8929 Other chronic pain: Secondary | ICD-10-CM | POA: Diagnosis not present

## 2022-11-28 DIAGNOSIS — E11621 Type 2 diabetes mellitus with foot ulcer: Secondary | ICD-10-CM | POA: Diagnosis not present

## 2022-11-28 DIAGNOSIS — Z794 Long term (current) use of insulin: Secondary | ICD-10-CM | POA: Diagnosis not present

## 2022-11-28 DIAGNOSIS — L97511 Non-pressure chronic ulcer of other part of right foot limited to breakdown of skin: Secondary | ICD-10-CM | POA: Diagnosis not present

## 2022-11-28 DIAGNOSIS — E1142 Type 2 diabetes mellitus with diabetic polyneuropathy: Secondary | ICD-10-CM | POA: Diagnosis not present

## 2022-11-28 DIAGNOSIS — Z853 Personal history of malignant neoplasm of breast: Secondary | ICD-10-CM | POA: Diagnosis not present

## 2022-11-29 ENCOUNTER — Encounter: Payer: Self-pay | Admitting: Podiatry

## 2022-11-29 ENCOUNTER — Ambulatory Visit: Payer: PPO | Admitting: Podiatry

## 2022-11-29 DIAGNOSIS — M869 Osteomyelitis, unspecified: Secondary | ICD-10-CM | POA: Diagnosis not present

## 2022-11-29 DIAGNOSIS — L97512 Non-pressure chronic ulcer of other part of right foot with fat layer exposed: Secondary | ICD-10-CM | POA: Diagnosis not present

## 2022-11-29 NOTE — Progress Notes (Signed)
Subjective:  Patient ID: Linda [name], female    DOB: 1970/11/01,  MRN: [id number]  Chief Complaint  Patient presents with   Diabetic Ulcer    Right 3rd toe ulcer f/up.    52 y.o. female presents with the above complaint. History confirmed with patient. Patient presenting with concern for follow-up on right third toe ulceration.  Here to discuss MRI results.  Patient has disc with her but no report.  Objective:  Physical Exam: warm, good capillary refill nail exam onychomycosis of the toenails DP pulses palpable, PT pulses palpable, and protective sensation absent Left Foot:  Pain with palpation of nails due to elongation and dystrophic growth.  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 Right Foot: Pain with palpation of nails due to elongation and dystrophic growth.  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    Ulceration present at the distal aspect of the third toe on the right foot to the subcutaneous fat tissue level.  Mild erythema and edema of the distal aspect of the third toe.  Stable from prior no worsening of infection at this time  No erythema or edema in the right hallux distal stump  11/08/2022 right foot XR 3 views AP lateral oblique weightbearing.  Findings: Attention directed to the cut portion of the proximal phalanx of the right hallux there is noted be irregularities and possibly erosions at the amputation margin.  On the third toe distal phalanx there is questionable osteolysis at the distal tuft of the distal phalanx specimen on oblique view.  Reviewed MRI images on disc patient brought a right foot MRI without contrast.  Evidence of T2 hyperintensity in the distal phalanx of the right third toe.  Additionally there is regularity and intensity of the distal phalanx of the hallux near the resected portion with possibly erosions at the area.  Concern for osteomyelitis both at the distal right hallux  amputation stump site as well as in the right third toe distal phalanx.  Assessment:   1. 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 Skin ulcer of third toe of right foot with fat layer exposed (HCC)      Plan:  Patient was evaluated and treated and all questions answered.  #Ulcer distal tuft of the right third toe to subcutaneous fat tissue # Possible osteomyelitis residually chronic in the proximal phalanx right hallux status post prior partial hallux amputation -Concern for possible underlying osteomyelitis of the distal phalanx of the right third toe as well as the distal proximal phalanx in the right hallux -MRI reviewed images as above however do not have the radiologist report.  Will try to obtain that to confirm osteomyelitis however given my read I do suspect osteomyelitis in the right hallux stump as well as in the right third toe. -Recommend we proceed with amputation of the distal portion of the right third toe as well as revision amputation of the remaining portion of the right hallux.  Patient is agreeable.  Discussed the risk benefits alternatives and possible complications as well as expected postoperative recovery course in detail.  Informed surgical site was obtained at this visit we will begin surgical planning pending final report from radiology with the MRI to confirm surgical plan.  -We discussed the etiology and factors that are a part of the wound healing process.  We also discussed the risk of infection both soft tissue and osteomyelitis from open ulceration.  Discussed the risk of limb loss if this happens or worsens. -Debridement deferred -Continue home dressing changes daily with mupirocin ointment and gauze bandage or Band-Aid -Continue off-loading with surgical shoe -Vascular testing deferred -HgbA1c: 6.5 -Last antibiotics: Currently no indication for antibiotics as the infection is likely chronic and there is no acute worsening of infection at this  time   Return for after OR.         Corinna Gab, DPM [facility] / [facility]

## 2022-12-02 ENCOUNTER — Encounter: Payer: Self-pay | Admitting: Podiatry

## 2022-12-06 DIAGNOSIS — Z6838 Body mass index (BMI) 38.0-38.9, adult: Secondary | ICD-10-CM | POA: Diagnosis not present

## 2022-12-06 DIAGNOSIS — M48062 Spinal stenosis, lumbar region with neurogenic claudication: Secondary | ICD-10-CM | POA: Diagnosis not present

## 2022-12-12 ENCOUNTER — Other Ambulatory Visit: Payer: Self-pay | Admitting: Family Medicine

## 2022-12-12 DIAGNOSIS — F50819 Binge eating disorder, unspecified: Secondary | ICD-10-CM

## 2022-12-12 DIAGNOSIS — K7460 Unspecified cirrhosis of liver: Secondary | ICD-10-CM

## 2022-12-12 DIAGNOSIS — E1140 Type 2 diabetes mellitus with diabetic neuropathy, unspecified: Secondary | ICD-10-CM

## 2022-12-14 NOTE — Progress Notes (Signed)
Surgery orders requested via Epic inbox. °

## 2022-12-16 ENCOUNTER — Telehealth: Payer: Self-pay | Admitting: Podiatry

## 2022-12-16 NOTE — Telephone Encounter (Signed)
DOS-01/04/2023  AMPUTATION TOE MPJ JOINT [id number] AMPUTATION TOE INTERPHALANGEAL [id number]  [employer] EFFECTIVE DATE- 01/24/2022  SPOKE WITH HAYLEY M FROM [employer] AND SHE STATED THAT PRIOR AUTH IS NOT REQUIRED FOR CPT CODES 42595 AND [phone].  CALL REFERENCE NUMBER: HAYLEY M. 01/15/2023 @ 9:05 AM EST

## 2022-12-16 NOTE — Progress Notes (Addendum)
[facility]  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 She was treated with bilateral mastectomies due to BRCA1 mutation.  She completed 4 cycles of Adriamycin/cyclophosphamide chemotherapy and received 8 out of 12 planned weeks of weekly paclitaxel.  She remains without evidence of recurrence.  I feel the axillary pain is likely related to strain on the muscles from remaining fat pad.   BRCA1 positive Positive BRCA1 mutation, which increases her risk for breast and ovarian cancer, as well as elevates her risk for pancreatic cancer.  She underwent hysterectomy/bilateral salpingo-oophorectomy, in addition to the bilateral mastectomies.  Due to her liver cirrhosis, she has been having annual CT abdomen to also image the pancreas.   Liver cirrhosis secondary to NASH (nonalcoholic steatohepatitis) (HCC) Liver cirrhosis as seen on CT imaging in August 2020.  She was referred to [name], CRNP, at the [facility] in [place].  Hepatitis panel was negative.  She received the appropriate vaccines.  Ms. [name] recommended every 6 month screening for hepatocellular cancer with ultrasound alternating with cross-sectional imaging to allow for screening for pancreatic cancer.    We have continued alternating right upper quadrant ultrasound and CT abdomen and pelvis every 6 months.  She has not had evidence of liver or pancreatic mass.  She has had fairly stable mild to moderate splenomegaly.  CT imaging in August 2023 revealed cirrhosis and 2 tiny hypodense liver lesions in the right lobe measuring 4 mm each,  which were reported as stable from September 2021.   Most recent CT chest, abdomen and pelvis in August 2024 revealed coarse nodular cirrhotic morphology of the liver without focal liver lesions, as well as mild to moderate splenomegaly, no evidence of malignancy.   She has worsening thrombocytopenia with a history of ITP, potentially due to the liver cirrhosis.  For the last 6 months, the platelet count has trended downward and is 64,000 today.  She has also had worsening anemia which is stable today.  INR is normal.  AFP is pending.  I will plan to see her back in 3 months with a CBC, comprehensive metabolic panel, PT/INR and AFP, B12, folate, as well as right upper quadrant ultrasound.   Idiopathic thrombocytopenic purpura (ITP) (HCC) Thrombocytopenia, which is felt to be due to chronic ITP and liver cirrhosis.  She continues to have regular blood work at Dr. [name] office.  The platelet count had been fluctuating up and down, but for the last 6 months has trended downward and is down to 64,000 today. I advised if she starts to experience any abnormal bleeding or excessive bruising before her next visit then to contact us.   Anemia Her hemoglobin is stable.  Previous B12 and folate levels were normal.  She continues an iron supplement.  We may want to recheck all of these levels again next year to see if she is absorbing properly.   Pulmonary nodule CT of the chest in November 2023 to follow-up on changes in the right lung base revealed resolution of the tree-in-bud nodularity in the posterior right lung base.  There was a new 6 mm left upper lobe pulmonary nodule,  which had resolved on CT chest in August 2024.    We will see her back in 3 months with a CBC, CMP, AFP, PT/INR, B12, folate, iron studies, and ultrasound of the liver. The patient understands the plans discussed today and is in agreement with them.  She knows to contact our office if she develops concerns prior to her next  appointment.  60 minutes was spent in patient care.  This included time spent preparing to see the patient (e.g., review of tests), obtaining and/or reviewing separately obtained history, counseling and educating the patient/family/caregiver, ordering medications, tests, or procedures; documenting clinical information in the electronic or other health record, independently interpreting results and communicating results to the patient/family/caregiver as well as coordination of care.      Adah Perl, PA-C  [facility] - A DEPT OF [name] [address] [place] [address] Dept: [phone] Dept Fax: [phone]   No orders of the defined types were placed in this encounter.   CHIEF COMPLAINT:  CC: Stage IB triple negative breast cancer   Current Treatment:  Observation   HISTORY OF PRESENT ILLNESS:  Evalyne Lacap is a 52 Y.O. female who we began seeing in February 2018 for evaluation of anemia.  Her anemia was felt to be secondary to iron deficiency and we recommended continuation of oral iron supplementation for a total of 6 months, as well as referral back to the gastroenterologist.  The patient also has a history of thrombocytopenia and had previously seen Dr. Melvyn Neth in 2014.  The thrombocytopenia was felt to be secondary to mild chronic immune thrombocytopenic purpura (ITP).    Due to the patient's family history of malignancy, she underwent testing for hereditary cancer syndromes with the Myriad myRisk Hereditary Cancer Panel test.  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  She underwent a robotic hysterectomy and bilateral salpingo-oophorectomy in May 2018 with Dr. Adolphus Birchwood.  Pathology was benign.  We also discussed the option of risk reducing bilateral mastectomy, but she chose  to have close surveillance, so we recommended annual MRI breast, in addition to annual mammography.  She was placed on chemoprevention with raloxifene in September 2018.    CT abdomen and pelvis in August 2020 done for bilateral flank pain, revealed a tiny left renal calculus, as well as probable hepatic cirrhosis without evidence of hepatic mass.  She was then referred to [facility] and is followed by Annamarie Major, CRNP in [place] for her liver cirrhosis.  She tested negative for hepatitis A, B, and C.  She received her hepatitis vaccines in 2020.  Ms. [name] recommended alternating right upper quadrant ultrasound and CT imaging every 6 months to evaluate for hepatocellular carcinoma, but also imaged the pancreas due to her BRCA1 mutation  We saw her in September 2021 for a new diagnosis of stage IB (T1c N0 M0) triple negative right breast cancer. Bilateral screening bilateral mammogram in August 2021 revealed possible masses in the right breast.  Diagnostic right mammogram and right breast ultrasound confirmed suspicious masses in the right breast at 5 o'clock measuring 1.5 cm and 6 o'clock measuring 1.4 cm.  There was an indeterminate 4 mm with possible distortion in the outer right breast without sonographic correlate.  Right axillary ultrasound was normal.  She then underwent  biopsies of both masses.  Pathology procedures revealed invasive ductal carcinoma, grade 3, with necrosis at 5 o'clock; and invasive ductal carcinoma, grade 1-2, with necrosis and focal myxoid change at 6 o'clock.  No DCIS was identified.  Estrogen, progesterone and HER2 receptors were negative. Ki67 was 30% at the 5 o'clock mass, and 40% at 6 o'clock.  She underwent bilateral mastectomies in September 2021 with Dr. Georgiana Shore. Surgical pathology from this procedure revealed invasive ductal carcinoma, grade 3, with myxoid change, 19 mm, at 6 o'clock, and invasive ductal carcinoma, grade 3, and ductal  carcinoma in situ, 19 mm, at 5 o'clock.  All margins were negative for invasive carcinoma or DCIS. Two sentinel lymph nodes were negative for metastatic carcinoma (0/2).  She was placed on adjuvant dose dense AC chemotherapy in October.  Due to cytopenias, she completed [facility] chemotherapy in January 2022.    She was placed on weekly paclitaxel in January 2022 received 8 out of 12 planned doses. She was admitted to the hospital for osteomyelitis of the left 2nd toe in March 2022.  She was treated with empiric vancomycin and Zosyn.  Amputation was recommended but the patient refused.  Cultures returned with Klebsiella, sensitive to IV Rocephin, so long term antibiotics 2 g Q24H for continued for the next 6 weeks. At that time, we decided to discontinue weekly paclitaxel, as she had received 8/12 cycles and had significant neuropathy.    She has done fairly well since that time.  She has mild chronic thrombocytopenia felt to be chronic ITP, although, this could be secondary to her liver disease.    Oncology History  Malignant neoplasm of lower-inner quadrant of right breast of female, estrogen receptor negative (HCC)  09/23/2019 Cancer Staging   Staging form: Breast, AJCC 8th Edition - Clinical stage from 09/23/2019: Stage IB (cT1c(2), cN0(sn), cM0, G3, ER-, PR-, HER2-) - Signed by [name], MD on 12/23/2019 Staging comments: Bilateral mastectomies,  BRCA 1 pos.   10/01/2019 Initial Diagnosis   Malignant neoplasm of lower-inner quadrant of right breast of female, estrogen receptor negative (HCC)   11/18/2019 - 04/01/2020 Chemotherapy         Hypokalemia (Resolved)  Dehydration (Resolved)    INTERVAL HISTORY:  Oliviagrace is here today for repeat clinical assessment. She has right axillary soreness and numbness of the left mastectomy site.  She reports she has lower extremity swelling and shortness of breath with exertion.  She denies chest pain.  She reports stable neuropathy of bilateral hands  and feet.  She denies fevers or chills.  Her appetite is good.  Her weight has increased 5 pounds over last 2 months .    REVIEW OF SYSTEMS:  Review of Systems  Constitutional: Negative.  Negative for appetite change, chills, diaphoresis, fatigue, fever and unexpected weight change.  HENT:  Negative.  Negative for lump/mass, mouth sores, nosebleeds and sore throat.   Respiratory:  Positive for shortness of breath (with exertion). Negative for cough.   Cardiovascular:  Negative for chest pain and leg swelling.  Gastrointestinal:  Negative for abdominal pain, blood in stool, constipation, diarrhea, nausea and vomiting.  Genitourinary:  Negative for difficulty urinating, dysuria, frequency, hematuria and vaginal bleeding.   Musculoskeletal:  Positive for myalgias (right axilla). Negative for arthralgias and back pain.       Tenderness in her right chest wall.   Skin:  Negative for itching and rash.  Neurological:  Positive for numbness (left mastectomy site and neuropathy bilateral feet pain). Negative  for dizziness, headaches and light-headedness.  Hematological:  Negative for adenopathy. Does not bruise/bleed easily.  Psychiatric/Behavioral:  Negative for depression and sleep disturbance. The patient is not nervous/anxious.     VITALS:  Blood pressure 133/67, pulse 99, temperature (!) 97.5 F (36.4 C), temperature source Oral, resp. rate 20, height 5\' 2"  (1.575 m), weight 211 lb (95.7 kg), last menstrual period 06/13/2009, SpO2 98%.  Wt Readings from Last 3 Encounters:  12/20/22 211 lb (95.7 kg)  10/13/22 206 lb 6.4 oz (93.6 kg)  09/21/22 205 lb 4.8 oz (93.1 kg)    Body mass index is 38.59 kg/m.  Performance status (ECOG): 1 - Symptomatic but completely ambulatory  PHYSICAL EXAM:  Physical Exam Vitals and nursing note reviewed.  Constitutional:      General: She is not in acute distress.    Appearance: Normal appearance. She is obese. She is not ill-appearing.  HENT:     Head:  Normocephalic and atraumatic.     Mouth/Throat:     Mouth: Mucous membranes are dry.     Pharynx: Oropharynx is clear. No oropharyngeal exudate or posterior oropharyngeal erythema.  Eyes:     General: No scleral icterus.    Extraocular Movements: Extraocular movements intact.     Conjunctiva/sclera: Conjunctivae normal.     Pupils: Pupils are equal, round, and reactive to light.  Cardiovascular:     Rate and Rhythm: Normal rate and regular rhythm.     Heart sounds: Normal heart sounds. No murmur heard.    No friction rub. No gallop.  Pulmonary:     Effort: Pulmonary effort is normal.     Breath sounds: Normal breath sounds. No wheezing, rhonchi or rales.  Chest:     Chest wall: Tenderness (lateral right chest wall) present.  Breasts:    Right: Absent.     Left: Absent.     Comments: Bilateral mastectomy sites are negative Abdominal:     General: There is distension (mild).     Palpations: Abdomen is soft. There is no mass.     Tenderness: There is no abdominal tenderness.     Comments: Difficult to assess for hepatosplenomegaly due to body habitus. No obvious fluid wave.  Musculoskeletal:        General: Normal range of motion.     Cervical back: Normal range of motion and neck supple. No tenderness.     Right lower leg: 1+ Edema present.     Left lower leg: 1+ Edema present.  Lymphadenopathy:     Cervical: No cervical adenopathy.  Skin:    General: Skin is warm and dry.     Coloration: Skin is not jaundiced.     Findings: No rash.  Neurological:     Mental Status: She is alert and oriented to person, place, and time.     Cranial Nerves: No cranial nerve deficit.  Psychiatric:        Mood and Affect: Mood normal.        Behavior: Behavior normal.        Thought Content: Thought content normal.    LABS:      Latest Ref Rng & Units 12/20/2022   11:01 AM 09/19/2022    9:45 AM 09/14/2022    8:29 AM  CBC  WBC 4.0 - 10.5 K/uL 4.5  3.8     7.4   Hemoglobin 12.0 - 15.0  g/dL 78.2  95.6     21.3   Hematocrit 36.0 - 46.0 % 34.7  33     40.1   Platelets 150 - 400 K/uL 64  76     118      This result is from an external source.      Latest Ref Rng & Units 12/20/2022   11:01 AM 09/19/2022    9:45 AM 09/14/2022    8:29 AM  CMP  Glucose 70 - 99 mg/dL 161   096   BUN 6 - 20 mg/dL 12  10     9    Creatinine 0.44 - 1.00 mg/dL 0.45  0.7     4.09   Sodium 135 - 145 mmol/L 139  139     144   Potassium 3.5 - 5.1 mmol/L 4.1  3.9     4.3   Chloride 98 - 111 mmol/L 102  103     103   CO2 22 - 32 mmol/L 27  26     24    Calcium 8.9 - 10.3 mg/dL 9.2  9.1     9.7   Total Protein 6.5 - 8.1 g/dL 6.3   7.0   Total Bilirubin <1.2 mg/dL 0.4   0.6   Alkaline Phos 38 - 126 U/L 93  74     98   AST 15 - 41 U/L 24  24     17    ALT 0 - 44 U/L 13  14     14       This result is from an external source.     No results found for: "CEA1", "CEA" / No results found for: "CEA1", "CEA" No results found for: "PSA1" No results found for: "WJX914" No results found for: "CAN125"  No results found for: "TOTALPROTELP", "ALBUMINELP", "A1GS", "A2GS", "BETS", "BETA2SER", "GAMS", "MSPIKE", "SPEI" Lab Results  Component Value Date   TIBC 312 05/20/2020   TIBC 300 01/13/2020   FERRITIN 98 05/20/2020   IRONPCTSAT 26 05/20/2020   IRONPCTSAT 24.3 01/13/2020   No results found for: "LDH"  STUDIES:  DG Foot Complete Right  Result Date: 12/18/2022 Please see detailed radiograph report in office note.    HISTORY:   Past Medical History:  Diagnosis Date   Acute postoperative respiratory insufficiency 04/17/2020   AKI (acute kidney injury) (HCC) 04/17/2020   Anemia    Anemia, unspecified    Anemia, unspecified    Anemia, unspecified    Anxiety    Arthritis    BRCA gene mutation positive    Chronic pain syndrome    Complication of anesthesia    Difficulty waking up   Depression    Diabetes mellitus without complication (HCC)    type 2   Diabetic ulcer of toe of left foot  associated with type 2 diabetes mellitus, with fat layer exposed (HCC) 03/27/2020   Gastritis    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 No longer has    Past Surgical History:  Procedure Laterality Date   AMPUTATION TOE  Bilateral 03/04/2022   Procedure: AMPUTATION TOE RIGHT FOOT PARTIAL OR TOTAL GREAT TOE AND SECOND TOE, LEFT FOOT PARTIAL OR TOTAL GREAT TOE, SECOND AND THIRD TOE;  Surgeon: Pilar Plate, DPM;  Location: [facility] OR;  Service: Podiatry;  Laterality: Bilateral;   APPENDECTOMY     BACK SURGERY     x 3 lower disc   BILATERAL TOTAL MASTECTOMY WITH AXILLARY LYMPH NODE DISSECTION Bilateral 09/2019   CHOLECYSTECTOMY     nephrolithiasis     ROBOTIC ASSISTED TOTAL HYSTERECTOMY WITH BILATERAL SALPINGO OOPHERECTOMY Bilateral 06/14/2016   Procedure: ROBOTIC ASSISTED TOTAL HYSTERECTOMY WITH BILATERAL SALPINGO OOPHORECTOMY;  Surgeon: Cleda Mccreedy, MD;  Location: [facility] ORS;  Service: Gynecology;  Laterality: Bilateral;    Family History  Problem Relation Age of Onset   Cancer Mother        breast   CAD Father    Diabetes Father    Heart failure Father    Cancer Father        renal carcinoma   Kidney failure Father    Cancer Brother 39       renal carcinoma.   Stroke Paternal Grandmother     Social History:  reports that she has never smoked. She has never used smokeless tobacco. She reports that she does not drink alcohol and does not use drugs.The patient is alone  today.  Allergies:   Allergies  Allergen Reactions   Codeine Shortness Of Breath and Other (See Comments)    Other reaction(s): SHOB    Augmentin [Amoxicillin-Pot Clavulanate] Diarrhea   Celebrex [Celecoxib] Other (See Comments)    Unknown reaction   Propranolol Other (See Comments)    Other reaction(s): Unknown   Propranolol Hcl Other (See Comments)    Unknown reaction  ask   Simvastatin Other (See Comments)    Other reaction(s): Unknown   Vytorin [Ezetimibe-Simvastatin] Other (See Comments)    Unknown reaction  Patient is not aware of an allergy to this medication, ask   Zetia [Ezetimibe] Other (See Comments)    Other reaction(s): Unknown    Current Medications: Current Outpatient Medications  Medication Sig Dispense Refill   acetaminophen (TYLENOL) 500 MG tablet Take 500 mg by mouth every 6 (six) hours as needed for moderate pain or mild pain.     albuterol (VENTOLIN HFA) 108 (90 Base) MCG/ACT inhaler INHALE TWO PUFFS BY MOUTH INTO LUNGS every SIX hours orn FOR WHEEZING AND/OR SHORTNESS OF BREATH 8.5 g 1   amitriptyline (ELAVIL) 25 MG tablet TAKE ONE TABLET BY MOUTH AT BEDTIME 30 tablet 5   atorvastatin (LIPITOR) 10 MG tablet TAKE ONE TABLET BY MOUTH EVERY EVENING 90 tablet 0   Blood Glucose Monitoring Suppl (ONETOUCH VERIO REFLECT) w/Device KIT AS DIRECTED     Blood Pressure Monitoring (SPHYGMOMANOMETER) MISC 1 each by Does not apply route daily in the afternoon. 1 each 0   buPROPion (WELLBUTRIN XL) 300 MG 24 hr tablet Take 1 tablet (300 mg total) by mouth every morning. 90 tablet 3   busPIRone (BUSPAR) 5 MG tablet Take 1 tablet (5 mg total) by mouth 3 (three) times daily. 90 tablet 5   clotrimazole-betamethasone (LOTRISONE) cream Apply twice daily as needed to area of concern 45 g 2   Continuous Blood Gluc Receiver (FREESTYLE LIBRE 2 READER) DEVI E11.69 Check blood sugar 4 times daily as directed 1 each 0   Continuous Glucose Sensor (FREESTYLE LIBRE 2 SENSOR) MISC USE TO check blood glucose AS  DIRECTED AND CHANGE sensor every 14 DAYS  6 each 3   cyclobenzaprine (FLEXERIL) 10 MG tablet TAKE ONE TABLET BY MOUTH every EIGHT hours AS NEEDED FOR MUSCLE SPASMS 270 tablet 1   dicyclomine (BENTYL) 20 MG tablet TAKE ONE TABLET BY MOUTH BEFORE MEALS AND AT BEDTIME AS NEEDED FOR STOMACH CRAMPING 180 tablet 1   ferrous sulfate 325 (65 FE) MG tablet Take 325 mg by mouth every evening.     gabapentin (NEURONTIN) 400 MG capsule TAKE 2 CAPSULES BY MOUTH 3 TIMES DAILY 180 capsule 1   Insulin Pen Needle (BD PEN NEEDLE NANO U/F) 32G X 4 MM MISC Use new needle with each injection. 30 each 2   Lancets (ONETOUCH DELICA PLUS LANCET30G) MISC USE TO check blood glucose 2-3 times daily AS DIRECTED 100 each 3   Levomilnacipran HCl ER (FETZIMA) 80 MG CP24 Take 1 capsule by mouth every evening. 90 capsule 1   levothyroxine (SYNTHROID) 75 MCG tablet Take 1 tablet (75 mcg total) by mouth daily before breakfast. 90 tablet 3   losartan (COZAAR) 50 MG tablet TAKE ONE TABLET BY MOUTH EVERY EVENING 90 tablet 1   Magnesium 500 MG CAPS Take 1 capsule (500 mg total) by mouth daily. 90 capsule 1   morphine (MS CONTIN) 30 MG 12 hr tablet Take 1 tablet (30 mg total) by mouth every 12 (twelve) hours. 60 tablet 0   MOUNJARO 7.5 MG/0.5ML Pen INJECT 7.5 MG INTO THE SKIN EVERY WEEK 2 mL 1   Multiple Vitamin (MULTIVITAMIN WITH MINERALS) TABS tablet Take 1 tablet by mouth daily. Solar ray     mupirocin ointment (BACTROBAN) 2 % Apply 1 Application topically daily. 22 g 0   omega-3 acid ethyl esters (LOVAZA) 1 g capsule TAKE TWO CAPSULES BY MOUTH TWICE DAILY 360 capsule 1   ondansetron (ZOFRAN) 4 MG tablet Take 1 tablet (4 mg total) by mouth every 4 (four) hours as needed for nausea. 90 tablet 3   OVER THE COUNTER MEDICATION Take 1 tablet by mouth in the morning and at bedtime. Lutein for eyes     pantoprazole (PROTONIX) 40 MG tablet Take 1 tablet (40 mg total) by mouth 2 (two) times daily. 180 tablet 1   polyethylene glycol (MIRALAX /  GLYCOLAX) 17 g packet Take 17 g by mouth daily as needed for moderate constipation.     potassium chloride (MICRO-K) 10 MEQ CR capsule Take 2 capsules (20 mEq total) by mouth 2 (two) times daily. 360 capsule 3   Probiotic Product (PROBIOTIC PO) Take 1 capsule by mouth in the morning. 3.2 billion cfu     prochlorperazine (COMPAZINE) 5 MG tablet TAKE ONE TABLET BY MOUTH every SIX hours AS NEEDED FOR NAUSEA AND VOMITING 30 tablet 1   ramelteon (ROZEREM) 8 MG tablet Take 1 tablet (8 mg total) by mouth at bedtime. 90 tablet 1   rOPINIRole (REQUIP) 0.25 MG tablet TAKE ONE TABLET BY MOUTH EVERYDAY AT BEDTIME 90 tablet 1   SYNJARDY 12.05-998 MG TABS TAKE ONE TABLET BY MOUTH TWICE DAILY 60 tablet 1   TRESIBA FLEXTOUCH 200 UNIT/ML FlexTouch Pen Inject 60 units into THE SKIN daily AS DIRECTED 9 mL 1   Vitamin D, Ergocalciferol, (DRISDOL) 1.25 MG (50000 UNIT) CAPS capsule TAKE ONE CAPSULE BY MOUTH ONCE WEEKLY ON FRIDAY 12 capsule 1   VRAYLAR 3 MG capsule TAKE ONE CAPSULE BY MOUTH EVERY DAY 90 capsule 0   VYVANSE 70 MG capsule TAKE ONE CAPSULE BY MOUTH EVERY DAY 30 capsule 0   No current facility-administered medications  for this visit.   Facility-Administered Medications Ordered in Other Visits  Medication Dose Route Frequency Provider Last Rate Last Admin   sodium chloride flush (NS) 0.9 % injection 10 mL  10 mL Intracatheter PRN Miliana Gangwer, Harvin Hazel A, PA-C   10 mL at 02/19/21 1004    I,Jasmine M Lassiter,acting as a Neurosurgeon for [employer], PA-C.,have documented all relevant documentation on the behalf of Adah Perl, PA-C,as directed by  Adah Perl, PA-C while in the presence of [employer], PA-C.

## 2022-12-20 ENCOUNTER — Other Ambulatory Visit: Payer: Self-pay | Admitting: Pharmacist

## 2022-12-20 ENCOUNTER — Encounter: Payer: Self-pay | Admitting: Hematology and Oncology

## 2022-12-20 ENCOUNTER — Inpatient Hospital Stay: Payer: PPO | Attending: Oncology | Admitting: Hematology and Oncology

## 2022-12-20 ENCOUNTER — Inpatient Hospital Stay: Payer: PPO

## 2022-12-20 VITALS — BP 133/67 | HR 99 | Temp 97.5°F | Resp 20 | Ht 62.0 in | Wt 211.0 lb

## 2022-12-20 DIAGNOSIS — D693 Immune thrombocytopenic purpura: Secondary | ICD-10-CM | POA: Insufficient documentation

## 2022-12-20 DIAGNOSIS — Z9079 Acquired absence of other genital organ(s): Secondary | ICD-10-CM | POA: Insufficient documentation

## 2022-12-20 DIAGNOSIS — C50311 Malignant neoplasm of lower-inner quadrant of right female breast: Secondary | ICD-10-CM | POA: Diagnosis not present

## 2022-12-20 DIAGNOSIS — R911 Solitary pulmonary nodule: Secondary | ICD-10-CM | POA: Diagnosis not present

## 2022-12-20 DIAGNOSIS — Z853 Personal history of malignant neoplasm of breast: Secondary | ICD-10-CM | POA: Diagnosis not present

## 2022-12-20 DIAGNOSIS — D696 Thrombocytopenia, unspecified: Secondary | ICD-10-CM

## 2022-12-20 DIAGNOSIS — D509 Iron deficiency anemia, unspecified: Secondary | ICD-10-CM | POA: Insufficient documentation

## 2022-12-20 DIAGNOSIS — Z90722 Acquired absence of ovaries, bilateral: Secondary | ICD-10-CM | POA: Insufficient documentation

## 2022-12-20 DIAGNOSIS — K7581 Nonalcoholic steatohepatitis (NASH): Secondary | ICD-10-CM | POA: Diagnosis not present

## 2022-12-20 DIAGNOSIS — Z1501 Genetic susceptibility to malignant neoplasm of breast: Secondary | ICD-10-CM | POA: Diagnosis not present

## 2022-12-20 DIAGNOSIS — R161 Splenomegaly, not elsewhere classified: Secondary | ICD-10-CM | POA: Diagnosis not present

## 2022-12-20 DIAGNOSIS — K7460 Unspecified cirrhosis of liver: Secondary | ICD-10-CM

## 2022-12-20 DIAGNOSIS — Z90710 Acquired absence of both cervix and uterus: Secondary | ICD-10-CM | POA: Insufficient documentation

## 2022-12-20 DIAGNOSIS — Z9013 Acquired absence of bilateral breasts and nipples: Secondary | ICD-10-CM | POA: Insufficient documentation

## 2022-12-20 DIAGNOSIS — D638 Anemia in other chronic diseases classified elsewhere: Secondary | ICD-10-CM | POA: Diagnosis not present

## 2022-12-20 DIAGNOSIS — Z171 Estrogen receptor negative status [ER-]: Secondary | ICD-10-CM

## 2022-12-20 LAB — CMP (CANCER CENTER ONLY)
ALT: 13 U/L (ref 0–44)
AST: 24 U/L (ref 15–41)
Albumin: 3.7 g/dL (ref 3.5–5.0)
Alkaline Phosphatase: 93 U/L (ref 38–126)
Anion gap: 10 (ref 5–15)
BUN: 12 mg/dL (ref 6–20)
CO2: 27 mmol/L (ref 22–32)
Calcium: 9.2 mg/dL (ref 8.9–10.3)
Chloride: 102 mmol/L (ref 98–111)
Creatinine: 1.02 mg/dL — ABNORMAL HIGH (ref 0.44–1.00)
GFR, Estimated: >60 mL/min (ref >60)
Glucose, Bld: 148 mg/dL — ABNORMAL HIGH (ref 70–99)
Potassium: 4.1 mmol/L (ref 3.5–5.1)
Sodium: 139 mmol/L (ref 135–145)
Total Bilirubin: 0.4 mg/dL (ref <1.2)
Total Protein: 6.3 g/dL — ABNORMAL LOW (ref 6.5–8.1)

## 2022-12-20 LAB — CBC WITH DIFFERENTIAL (CANCER CENTER ONLY)
Abs Immature Granulocytes: 0.01 10*3/uL (ref 0.00–0.07)
Basophils Absolute: 0 10*3/uL (ref 0.0–0.1)
Basophils Relative: 0 %
Eosinophils Absolute: 0.1 10*3/uL (ref 0.0–0.5)
Eosinophils Relative: 2 %
HCT: 34.7 % — ABNORMAL LOW (ref 36.0–46.0)
Hemoglobin: 10.7 g/dL — ABNORMAL LOW (ref 12.0–15.0)
Immature Granulocytes: 0 %
Lymphocytes Relative: 25 %
Lymphs Abs: 1.1 10*3/uL (ref 0.7–4.0)
MCH: 26.2 pg (ref 26.0–34.0)
MCHC: 30.8 g/dL (ref 30.0–36.0)
MCV: 84.8 fL (ref 80.0–100.0)
Monocytes Absolute: 0.3 10*3/uL (ref 0.1–1.0)
Monocytes Relative: 7 %
Neutro Abs: 2.9 10*3/uL (ref 1.7–7.7)
Neutrophils Relative %: 66 %
Platelet Count: 64 10*3/uL — ABNORMAL LOW (ref 150–400)
RBC: 4.09 MIL/uL (ref 3.87–5.11)
RDW: 14.7 % (ref 11.5–15.5)
WBC Count: 4.5 10*3/uL (ref 4.0–10.5)
nRBC: 0 % (ref 0.0–0.2)
nRBC: 0 /100{WBCs}

## 2022-12-20 LAB — PROTIME-INR
INR: 1.1 (ref 0.8–1.2)
Prothrombin Time: 14.6 s (ref 11.4–15.2)

## 2022-12-20 MED ORDER — HEPARIN SOD (PORK) LOCK FLUSH 100 UNIT/ML IV SOLN
500.0000 [IU] | Freq: Once | INTRAVENOUS | Status: AC
  Administered 2022-12-20: 500 [IU]

## 2022-12-20 MED ORDER — SODIUM CHLORIDE 0.9% FLUSH
10.0000 mL | Freq: Once | INTRAVENOUS | Status: AC
Start: 2022-12-20 — End: 2022-12-20
  Administered 2022-12-20: 10 mL

## 2022-12-20 NOTE — Progress Notes (Signed)
COVID Vaccine received:  []  No [x]  Yes Date of any COVID positive Test in last 90 days:  PCP - [name], MD  [phone] (Work) [phone] (Fax)  Cardiologist -   Chest x-ray - 03-03-2022  2v  Epic EKG -  03-04-2022  Epic Stress Test -  ECHO -  Cardiac Cath -   PCR screen: []  Ordered & Completed []   No Order but Needs PROFEND     [x]   N/A for this surgery  Surgery Plan:  [x]  Ambulatory   []  Outpatient in bed  []  Admit Anesthesia:    []  General  []  Spinal  []   Choice [x]   MAC  Pacemaker / ICD device [x]  No []  Yes   Spinal Cord Stimulator:[x]  No []  Yes       History of Sleep Apnea? []  No [x]  Yes   Denies CPAP used?- []  No []  Yes    Does the patient monitor blood sugar?   []  N/A   []  No []  Yes  Patient has: []  NO Hx DM   []  Pre-DM   []  DM1  []   DM2 Last A1c was:        on      Does patient have a [employer] or Dexacom? []  No []  Yes   Fasting Blood Sugar Ranges-  Checks Blood Sugar _____ times a day  GLP1 agonist / usual dose -  GLP1 instructions:  SGLT-2 inhibitors / usual dose -  SGLT-2 instructions:   Other Diabetic medications/ instructions:   Blood Thinner / Instructions: Aspirin Instructions:  ERAS Protocol Ordered: []  No  []  Yes PRE-SURGERY []  ENSURE  []  G2   []  No Drink Ordered  Patient is to be NPO after:   Dental hx: []  Dentures:  []  N/A      []  Bridge or Partial:                   []  Loose or Damaged teeth:   Comments:   Activity level: Patient is able / unable to climb a flight of stairs without difficulty; []  No CP  []  No SOB, but would have ___   Patient can / can not perform ADLs without assistance.   Anesthesia review:   Patient denies shortness of breath, fever, cough and chest pain at PAT appointment.  Patient verbalized understanding and agreement to the Pre-Surgical Instructions that were given to them at this PAT appointment. Patient was also educated of the need to review these PAT instructions again prior to his/her surgery.I  reviewed the appropriate phone numbers to call if they have any and questions or concerns.

## 2022-12-21 ENCOUNTER — Telehealth: Payer: Self-pay

## 2022-12-21 ENCOUNTER — Other Ambulatory Visit: Payer: Self-pay

## 2022-12-21 ENCOUNTER — Encounter (HOSPITAL_COMMUNITY)
Admission: RE | Admit: 2022-12-21 | Discharge: 2022-12-21 | Disposition: A | Discharge status: Home / Self Care | Payer: PPO | Source: Ambulatory Visit | Attending: Podiatry | Admitting: Podiatry

## 2022-12-21 ENCOUNTER — Encounter (HOSPITAL_COMMUNITY): Payer: Self-pay

## 2022-12-21 VITALS — BP 122/64 | HR 90 | Temp 98.4°F | Resp 20 | Ht 62.0 in | Wt 208.0 lb

## 2022-12-21 DIAGNOSIS — K7581 Nonalcoholic steatohepatitis (NASH): Secondary | ICD-10-CM | POA: Insufficient documentation

## 2022-12-21 DIAGNOSIS — Z01812 Encounter for preprocedural laboratory examination: Secondary | ICD-10-CM | POA: Diagnosis not present

## 2022-12-21 DIAGNOSIS — Z794 Long term (current) use of insulin: Secondary | ICD-10-CM | POA: Insufficient documentation

## 2022-12-21 DIAGNOSIS — K7460 Unspecified cirrhosis of liver: Secondary | ICD-10-CM | POA: Insufficient documentation

## 2022-12-21 DIAGNOSIS — Z01818 Encounter for other preprocedural examination: Secondary | ICD-10-CM

## 2022-12-21 DIAGNOSIS — E1140 Type 2 diabetes mellitus with diabetic neuropathy, unspecified: Secondary | ICD-10-CM | POA: Diagnosis not present

## 2022-12-21 HISTORY — DX: Unspecified cirrhosis of liver: K74.60

## 2022-12-21 HISTORY — DX: Myoneural disorder, unspecified: G70.9

## 2022-12-21 HISTORY — DX: Disease of blood and blood-forming organs, unspecified: D75.9

## 2022-12-21 HISTORY — DX: Pneumonia, unspecified organism: J18.9

## 2022-12-21 LAB — COMPREHENSIVE METABOLIC PANEL
ALT: 17 U/L (ref 0–44)
AST: 24 U/L (ref 15–41)
Albumin: 3.8 g/dL (ref 3.5–5.0)
Alkaline Phosphatase: 81 U/L (ref 38–126)
Anion gap: 10 (ref 5–15)
BUN: 17 mg/dL (ref 6–20)
CO2: 25 mmol/L (ref 22–32)
Calcium: 9.2 mg/dL (ref 8.9–10.3)
Chloride: 105 mmol/L (ref 98–111)
Creatinine, Ser: 1 mg/dL (ref 0.44–1.00)
GFR, Estimated: >60 mL/min (ref >60)
Glucose, Bld: 141 mg/dL — ABNORMAL HIGH (ref 70–99)
Potassium: 4 mmol/L (ref 3.5–5.1)
Sodium: 140 mmol/L (ref 135–145)
Total Bilirubin: 0.6 mg/dL (ref <1.2)
Total Protein: 7 g/dL (ref 6.5–8.1)

## 2022-12-21 LAB — CBC
HCT: 37.5 % (ref 36.0–46.0)
Hemoglobin: 11.4 g/dL — ABNORMAL LOW (ref 12.0–15.0)
MCH: 26.5 pg (ref 26.0–34.0)
MCHC: 30.4 g/dL (ref 30.0–36.0)
MCV: 87.2 fL (ref 80.0–100.0)
Platelets: 79 10*3/uL — ABNORMAL LOW (ref 150–400)
RBC: 4.3 MIL/uL (ref 3.87–5.11)
RDW: 15 % (ref 11.5–15.5)
WBC: 4.4 10*3/uL (ref 4.0–10.5)
nRBC: 0 % (ref 0.0–0.2)

## 2022-12-21 LAB — AFP TUMOR MARKER: AFP, Serum, Tumor Marker: 2.7 ng/mL (ref 0.0–9.2)

## 2022-12-21 LAB — HEMOGLOBIN A1C
Hgb A1c MFr Bld: 6.7 % — ABNORMAL HIGH (ref 4.8–5.6)
Mean Plasma Glucose: 145.59 mg/dL

## 2022-12-21 NOTE — Telephone Encounter (Signed)
Message left her markers were normal call with any concerns.

## 2022-12-21 NOTE — Telephone Encounter (Signed)
-----   Message from Adah Perl sent at 12/21/2022  2:53 PM EST ----- Please let her know her cancer marker was normal. Thanks

## 2022-12-21 NOTE — Patient Instructions (Addendum)
SURGICAL WAITING ROOM VISITATION Patients having surgery or a procedure may have no more than 2 support people in the waiting area - these visitors may rotate in the visitor waiting room.   Due to an increase in RSV and influenza rates and associated hospitalizations, children ages 13 and under may not visit patients in [facility] hospitals. If the patient needs to stay at the hospital during part of their recovery, the visitor guidelines for inpatient rooms apply.  PRE-OP VISITATION  Pre-op nurse will coordinate an appropriate time for 1 support person to accompany the patient in pre-op.  This support person may not rotate.  This visitor will be contacted when the time is appropriate for the visitor to come back in the pre-op area.  Please refer to the [facility] website for the visitor guidelines for Inpatients (after your surgery is over and you are in a regular room).  You are not required to quarantine at this time prior to your surgery. However, you must do this: Hand Hygiene often Do NOT share personal items Notify your provider if you are in close contact with someone who has COVID or you develop fever 100.4 or greater, new onset of sneezing, cough, sore throat, shortness of breath or body aches.  If you test positive for Covid or have been in contact with anyone that has tested positive in the last 10 days please notify you surgeon.    Your procedure is scheduled on:  Wednesday  January 04, 2023  Report to [facility] Main Entrance: [facility] entrance where the [employer] is available.   Report to admitting at:   11:00  AM  Call this number if you have any questions or problems the morning of surgery [phone]  DO NOT EAT OR DRINK ANYTHING AFTER MIDNIGHT THE NIGHT PRIOR TO YOUR SURGERY / PROCEDURE.   FOLLOW  ANY ADDITIONAL PRE OP INSTRUCTIONS YOU RECEIVED FROM YOUR SURGEON'S OFFICE!!!   Oral Hygiene is also important to reduce your risk of infection.         Remember - BRUSH YOUR TEETH THE MORNING OF SURGERY WITH YOUR REGULAR TOOTHPASTE  Do NOT smoke after Midnight the night before surgery.  How to Manage Your Diabetes Before and After Surgery  Why is it important to control my blood sugar before and after surgery? Improving blood sugar levels before and after surgery helps healing and can limit problems. A way of improving blood sugar control is eating a healthy diet by:  Eating less sugar and carbohydrates  Increasing activity/exercise  Talking with your doctor about reaching your blood sugar goals High blood sugars (greater than 180 mg/dL) can raise your risk of infections and slow your recovery, so you will need to focus on controlling your diabetes during the weeks before surgery. Make sure that the doctor who takes care of your diabetes knows about your planned surgery including the date and location.  How do I manage my blood sugar before surgery? Check your blood sugar at least 4 times a day, starting 2 days before surgery, to make sure that the level is not too high or low. Check your blood sugar the morning of your surgery when you wake up and every 2 hours until you get to the Short Stay unit. If your blood sugar is less than 70 mg/dL, you will need to treat for low blood sugar: Do not take insulin. Treat a low blood sugar (less than 70 mg/dL) with  cup of clear juice (cranberry  or apple), 4 glucose tablets, OR glucose gel. Recheck blood sugar in 15 minutes after treatment (to make sure it is greater than 70 mg/dL). If your blood sugar is not greater than 70 mg/dL on recheck, call [phone] for further instructions. Report your blood sugar to the short stay nurse when you get to Short Stay.  If you are admitted to the hospital after surgery: Your blood sugar will be checked by the staff and you will probably be given insulin after surgery (instead of oral diabetes medicines) to make sure you have good blood sugar levels. The  goal for blood sugar control after surgery is 80-180 mg/dL.   WHAT DO I DO ABOUT MY DIABETES MEDICATION?  Mounjaro injection on Saturdays,  Last dose on Saturday 12-24-2022.   Synjardy tab 12.05-998 mg-  stop 72 hours prior to surgery.  Last dose will be taken on Saturday 12-31-2022 Tresiba insulin  60 units q am, Day before surgery take in the morning.  Day of surgery: DO NOT TAKE TRESIBA.     IF you have any questions, call the nurse at [phone]  STOP TAKING all Vitamins, Herbs and supplements 1 week before your surgery.   Take ONLY these medicines the morning of surgery with A SIP OF WATER: Pantoprazole, levothyroxine, Vraylar, Bupropion (Wellbutrin), and Buspirone (Buspar),  You may use your albuterol inhaler if needed.    You may take Tylenol OR MS Contin if needed for pain.                     You may not have any metal on your body including hair pins, jewelry, and body piercing  Do not wear make-up, lotions, powders, perfumes or deodorant  Do not wear nail polish including gel and S&S, artificial / acrylic nails, or any other type of covering on natural nails including finger and toenails. If you have artificial nails, gel coating, etc., that needs to be removed by a nail salon, Please have this removed prior to surgery. Not doing so may mean that your surgery could be cancelled or delayed if the Surgeon or anesthesia staff feels like they are unable to monitor you safely.   Do not shave 48 hours prior to surgery to avoid nicks in your skin which may contribute to postoperative infections.   Contacts, Hearing Aids, dentures or bridgework may not be worn into surgery. DENTURES WILL BE REMOVED PRIOR TO SURGERY PLEASE DO NOT APPLY "Poly grip" OR ADHESIVES!!!  Patients discharged on the day of surgery will not be allowed to drive home.  Someone NEEDS to stay with you for the first 24 hours after anesthesia.  Do not bring your home medications to the hospital. The Pharmacy will  dispense medications listed on your medication list to you during your admission in the Hospital.  Please read over the following fact sheets you were given: IF YOU HAVE QUESTIONS ABOUT YOUR PRE-OP INSTRUCTIONS, PLEASE CALL [phone].   [facility] - Preparing for Surgery Before surgery, you can play an important role.  Because skin is not sterile, your skin needs to be as free of germs as possible.  You can reduce the number of germs on your skin by washing with CHG (chlorahexidine gluconate) soap before surgery.  CHG is an antiseptic cleaner which kills germs and bonds with the skin to continue killing germs even after washing. Please DO NOT use if you have an allergy to CHG or antibacterial soaps.  If your skin becomes reddened/irritated stop  using the CHG and inform your nurse when you arrive at Short Stay. Do not shave (including legs and underarms) for at least 48 hours prior to the first CHG shower.  You may shave your face/neck.  Please follow these instructions carefully:  1.  Shower with CHG Soap the night before surgery and the  morning of surgery.  2.  If you choose to wash your hair, wash your hair first as usual with your normal  shampoo.  3.  After you shampoo, rinse your hair and body thoroughly to remove the shampoo.                             4.  Use CHG as you would any other liquid soap.  You can apply chg directly to the skin and wash.  Gently with a scrungie or clean washcloth.  5.  Apply the CHG Soap to your body ONLY FROM THE NECK DOWN.   Do not use on face/ open                           Wound or open sores. Avoid contact with eyes, ears mouth and genitals (private parts).                       Wash face,  Genitals (private parts) with your normal soap.             6.  Wash thoroughly, paying special attention to the area where your  surgery  will be performed.  7.  Thoroughly rinse your body with warm water from the neck down.  8.  DO NOT shower/wash with your normal  soap after using and rinsing off the CHG Soap.            9.  Pat yourself dry with a clean towel.            10.  Wear clean pajamas.            11.  Place clean sheets on your bed the night of your first shower and do not  sleep with pets.  ON THE DAY OF SURGERY : Do not apply any lotions/deodorants the morning of surgery.  Please wear clean clothes to the hospital/surgery center.    FAILURE TO FOLLOW THESE INSTRUCTIONS MAY RESULT IN THE CANCELLATION OF YOUR SURGERY  PATIENT SIGNATURE_________________________________  NURSE SIGNATURE__________________________________  ________________________________________________________________________

## 2022-12-23 ENCOUNTER — Telehealth: Payer: Self-pay | Admitting: *Deleted

## 2022-12-23 NOTE — Progress Notes (Signed)
  Care Coordination Note  12/23/2022 Name: Linda [name] MRN: [id number] DOB: 01-Jan-1971  Linda [name] is a 52 y.o. year old female who is a primary care patient of [name], Kirsten, MD and is actively engaged with the care management team. I reached out to [name] by phone today to assist with re-scheduling a follow up visit with the RN Case Manager  Follow up plan: Unsuccessful telephone outreach attempt made. A HIPAA compliant phone message was left for the patient providing contact information and requesting a return call.   [facility]  Care Coordination Care Guide  Direct Dial: [phone]

## 2022-12-23 NOTE — Progress Notes (Signed)
  Care Coordination Note  12/23/2022 Name: Linda [name] MRN: [id number] DOB: 1970/08/31  Linda [name] is a 52 y.o. year old female who is a primary care patient of [name], Kirsten, MD and is actively engaged with the care management team. I reached out to [name] by phone today to assist with re-scheduling a follow up visit with the RN Case Manager  Follow up plan: Telephone appointment with care management team member scheduled for:12/5  [facility] Coordination Care Guide  Direct Dial: [phone]

## 2022-12-26 ENCOUNTER — Other Ambulatory Visit: Payer: Self-pay

## 2022-12-26 MED ORDER — ONETOUCH DELICA PLUS LANCET30G MISC: 3 refills | Status: DC

## 2022-12-26 MED ORDER — GLUCOSE BLOOD VI STRP: ORAL_STRIP | 12 refills | Status: DC

## 2022-12-27 ENCOUNTER — Other Ambulatory Visit: Payer: Self-pay

## 2022-12-27 ENCOUNTER — Other Ambulatory Visit: Payer: Self-pay | Admitting: Family Medicine

## 2022-12-27 ENCOUNTER — Ambulatory Visit: Payer: PPO | Admitting: Podiatry

## 2022-12-27 ENCOUNTER — Encounter: Payer: Self-pay | Admitting: Podiatry

## 2022-12-27 DIAGNOSIS — L84 Corns and callosities: Secondary | ICD-10-CM | POA: Diagnosis not present

## 2022-12-27 DIAGNOSIS — L97511 Non-pressure chronic ulcer of other part of right foot limited to breakdown of skin: Secondary | ICD-10-CM

## 2022-12-27 DIAGNOSIS — E1142 Type 2 diabetes mellitus with diabetic polyneuropathy: Secondary | ICD-10-CM

## 2022-12-27 DIAGNOSIS — M869 Osteomyelitis, unspecified: Secondary | ICD-10-CM

## 2022-12-27 DIAGNOSIS — B351 Tinea unguium: Secondary | ICD-10-CM

## 2022-12-27 DIAGNOSIS — M2042 Other hammer toe(s) (acquired), left foot: Secondary | ICD-10-CM

## 2022-12-27 DIAGNOSIS — E1140 Type 2 diabetes mellitus with diabetic neuropathy, unspecified: Secondary | ICD-10-CM

## 2022-12-28 NOTE — Progress Notes (Signed)
Chief Complaint  Patient presents with   Nail Problem    LT 3rd hallux is beginning to develop similar issue as the RT 3rd. She is scheduled for next week to have the RT removed at the [facility]. Last A1c: 6.7.     HPI: 52 y.o. female presents today with concern for hammertoe contracture of the left third toe and concern for possible developing wound.  Patient is diabetic, last A1c 6.7.  She does have known ulcer to the right third toe which is concerning for osteomyelitis, the patient has plans to undergo amputation of the right third toe, revision of right first toe partial hallux amputation with Dr. Annamary Rummage on 12/11.  She denies any nausea, vomiting, fever, chills, chest pain, shortness of breath.  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 No longer has    Past Surgical History:  Procedure Laterality Date   AMPUTATION TOE Bilateral 03/04/2022   Procedure: AMPUTATION TOE RIGHT FOOT PARTIAL OR TOTAL GREAT TOE AND SECOND TOE, LEFT FOOT PARTIAL OR TOTAL GREAT TOE, SECOND AND THIRD TOE;  Surgeon: Pilar Plate, DPM;  Location: [facility] OR;  Service: Podiatry;  Laterality: Bilateral;   APPENDECTOMY     BACK SURGERY     x 3 lower disc   BILATERAL TOTAL MASTECTOMY WITH AXILLARY LYMPH NODE DISSECTION Bilateral 09/2019   CHOLECYSTECTOMY     nephrolithiasis     ROBOTIC ASSISTED TOTAL HYSTERECTOMY WITH BILATERAL SALPINGO OOPHERECTOMY Bilateral 06/14/2016   Procedure: ROBOTIC ASSISTED TOTAL HYSTERECTOMY WITH BILATERAL SALPINGO OOPHORECTOMY;  Surgeon: Cleda Mccreedy, MD;  Location: [facility] ORS;  Service: Gynecology;  Laterality: Bilateral;    Allergies  Allergen Reactions   Codeine Shortness Of Breath and Other (See Comments)    Other reaction(s): SHOB    Augmentin [Amoxicillin-Pot Clavulanate] Diarrhea   Celebrex [Celecoxib] Other (See Comments)    Unknown reaction   Propranolol Other (See Comments)    Other reaction(s): Unknown   Propranolol Hcl Other (See Comments)    Unknown reaction  ask   Simvastatin Other (See Comments)    Other reaction(s): Unknown  Vytorin [Ezetimibe-Simvastatin] Other (See Comments)    Unknown reaction  Patient is not aware of an allergy to this medication, ask   Zetia [Ezetimibe] Other (See Comments)    Other reaction(s): Unknown    ROS    Physical Exam: There were no vitals filed for this visit.  General: The patient is alert and oriented x3 in no acute distress.  Dermatology: Right third toe ulceration present to distal tuft with significant hyperkeratotic buildup measuring 0.5 x 0.3 cm predebridement with fat layer exposed.  Excisionally debrided down to healthy bleeding tissue 0.7 x 0.5 cm, increased granulation tissue.  Edema present to this toe without significant erythema, no  drainage, no odor, no signs of acute infection  Left fourth toe significant hyperkeratotic buildup to the distal tuft this was sharply debrided, no underlying ulcer appreciated.  Elongated nails to the remaining digits x 5 concerning for developing wound due to the hammertoe contractures  Vascular: Palpable pedal pulses bilaterally. Capillary refill within normal limits to unaffected digits.  No generalized lower extremity edema.  Neurological: Protective sensation grossly diminished bilaterally  Musculoskeletal Exam: Status post multiple digital amputations including digits 1 through 3 left foot, second toe and partial first toe right foot.  Remaining digits notable for significant but reducible hammertoe contractures.   Assessment/Plan of Care: 1. 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 DM type 2 with diabetic peripheral neuropathy (HCC)   3. Right foot ulcer, limited to breakdown of skin (HCC)   4. Hammertoe of left foot   5. Onychomycosis   6. Pre-ulcerative calluses      No orders of the defined types were placed in this encounter.  None  Discussed clinical findings with patient today.  Plan: -Right third toe excisionally debrided down to including skin, subcutaneous tissue to healthy bleeding base.  Pre and postdebridement measurements as described above.  No anesthetic was required secondary to neuropathy.  Patient tolerated this well.  Hemostasis achieved with compression.  Dressed with Betadine gauze dressing and light Coban. -Patient will continue with local wound care until surgical date.  No need for antibiotic therapy as toe does appear stable.  Contact office if signs of infection develop -Remaining toenails x 5 were sharply debrided using aseptic nail nippers out of concern for potentially creating a wound as the toes do press against each other due to hammertoe contracture - Preulcerative callus to the left fourth toe was sharply debrided using a 312 scalpel  blade without incident.  No underlying callus was appreciated - Gel crest pad dispensed for the left foot as the toes to straighten out, to be worn when ambulatory to prevent ulceration and breakdown of the distal tuft of left fourth toe - Patient will proceed with surgery as planned with Dr. Annamary Rummage.  Discussed with patient she can continue follow-up in this office, tentative plan undergo tenotomy of the left fourth toe to reduce risk of ulceration   Jahmier Willadsen L. Marchia Bond, AACFAS [facility]     [address] [place], [place] [address]                Office [phone]  Fax [phone]

## 2022-12-29 ENCOUNTER — Other Ambulatory Visit: Payer: Self-pay | Admitting: *Deleted

## 2022-12-29 ENCOUNTER — Telehealth: Payer: Self-pay | Admitting: Urology

## 2022-12-29 NOTE — Patient Outreach (Signed)
Care Management   Visit Note  12/29/2022 Name: Linda [name] MRN: [id number] DOB: Feb 24, 1970  Subjective: Linda [name] is a 52 y.o. year old female who is a primary care patient of [name], Kirsten, MD. The Care Management team was consulted for assistance.      Engaged with patient spoke with patient by telephone.    Goals Addressed             This Visit's Progress    RNCM Care Management Expected Outcome:  Monitor, Self-Manage and Reduce Symptoms of Diabetes       Current Barriers:  Knowledge Deficits related to concerns about places on her toes and not being able to be seen by podiatrist until next week, the patient expressed concern over being diabetic and not wanting to lose her toes Care Coordination needs related to resources in the area to help with her expressed needs and caring for her self due to lack of support system in a patient with DM Chronic Disease Management support and education needs related to effective management of DM Lacks caregiver support.  Lab Results  Component Value Date   HGBA1C 6.7 (H) 12/21/2022     Planned Interventions: Provided education to patient about basic DM disease process. Verbalizes understanding of her diabetes as it relates to proper wound healing.  Reviewed medications with patient and discussed importance of medication adherence. The patient is compliant with her medications. Denies any acute changes in her medications. ;        Reviewed prescribed diet with patient heart healthy/ADA diet. Is compliant with her diet; Counseled on importance of regular laboratory monitoring as prescribed. Has regular labwork ;        Discussed plans with patient for ongoing care management follow up and provided patient with direct contact information for care management team;      Provided patient with written educational materials related to hypo and hyperglycemia and importance of correct treatment. The lowest the patient has seen is  96 and the highest is 196; Fasting: 115. She states typically she ranges from 120's-130's.      Reviewed scheduled/upcoming provider appointments including: 01-24-2023 with PCP.       Advised patient, providing education and rationale, to check cbg when you have symptoms of low or high blood sugar and has a continuous glucose reader, freestyle [place]  and record. Patient states she wants RNCM to follow up with provider about switching her meter to a Libre 3 at the recommendation of her health plan nurse stating it could provide her with her A1C. RNCM to follow up with PCP/PharmD. Referral made to pharmacy team for assistance with ongoing support and education for medication needs ;       Referral made to social work team for assistance with ongoing support and education for effective management of stress, anxiety, depression ;      Referral made to community resources care guide team for assistance with food resources and community resources;      Review of patient status, including review of consultants reports, relevant laboratory and other test results, and medications completed;       Advised patient to discuss changes in her DM and questions and concerns with provider;      Screening for signs and symptoms of depression related to chronic disease state;        Assessed social determinant of health barriers;        The patient states she is having more pain in  her legs and feet and it is sometimes unbearable. The patient states that she has had a change in her medications but cannot tell that it has helped. Ask for recommendations. Was fitted yesterday for therapeutic shoes. She is seeing the specialist and is following up next week with the specialist. Will continue to monitor.    Symptom Management: Take medications as prescribed   Attend all scheduled provider appointments Call provider office for new concerns or questions  call the Suicide and Crisis Lifeline: 988 call the [place] National  Suicide Prevention Lifeline: (959)324-7735 or TTY: 418-807-2620 TTY 971-101-1965) to talk to a trained counselor call 1-800-273-TALK (toll free, 24 hour hotline) if experiencing a Mental Health or Behavioral Health Crisis  keep appointment with eye doctor check feet daily for cuts, sores or redness trim toenails straight across manage portion size wash and dry feet carefully every day wear comfortable, cotton socks wear comfortable, well-fitting shoes  Follow Up Plan: Telephone follow up appointment with care management team member scheduled for: 02-02-2023 at 9:45 am       [facility] Care Management Expected Outcome:  Monitor, Self-Manage and Reduce Symptoms of HLD       Current Barriers:  Chronic Disease Management support and education needs related to effective management of HLD Lab Results  Component Value Date   CHOL 149 09/14/2022   HDL 43 09/14/2022   LDLCALC 76 09/14/2022   TRIG 175 (H) 09/14/2022   CHOLHDL 3.5 09/14/2022     Planned Interventions: Provider established cholesterol goals reviewed. Review of labs. The patient is above goal on her triglycerides. Education and support given. ; Counseled on importance of regular laboratory monitoring as prescribed. ; Provided HLD educational materials; Reviewed role and benefits of statin for ASCVD risk reduction. Compliance with Lipitor. Discussed strategies to manage statin-induced myalgias. Denies myalgias. Reviewed importance of limiting foods high in cholesterol. Education and support provided on a heart healthy/ADA diet. The patient tries to watch what she is eating.  Screening for signs and symptoms of depression related to chronic disease state;  Assessed social determinant of health barriers;   Symptom Management: Take medications as prescribed   Attend all scheduled provider appointments Call provider office for new concerns or questions  call the Suicide and Crisis Lifeline: 988 call the [place] National Suicide  Prevention Lifeline: 563-109-8385 or TTY: 971-233-1855 TTY (785)841-0034) to talk to a trained counselor call 1-800-273-TALK (toll free, 24 hour hotline) if experiencing a Mental Health or Behavioral Health Crisis  - take all medications exactly as prescribed - call doctor with any symptoms you believe are related to your medicine - call doctor when you experience any new symptoms - go to all doctor appointments as scheduled - adhere to prescribed diet: heart healthy/ADA diet   Follow Up Plan: Telephone follow up appointment with care management team member scheduled for: 02-02-2023 at 9:45 am       RNCM Care Management Expected Outcome:  Monitor, Self-Manage and Reduce Symptoms of: Depression       Current Barriers:  Knowledge Deficits related to how to manage emotions and stress level in a patient with multiple chronic conditions including depression Care Coordination needs related to resources in the community for food insecurities and additional support for social worker support  in a patient with depression and increased stress level Chronic Disease Management support and education needs related to effective management of depression  Lacks caregiver support.   Planned Interventions: Evaluation of current treatment plan related to depression  and  patient's adherence to plan as established by provider. The patient states she has good days and bad days. 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 She states podiatry is following closely.  She has surgery for amputation scheduled for 01-04-2023 with post op follow up scheduled for 01-10-2023 with podiatry. Advised patient to call the office for changes in mood, anxiety, depression, stress level, or mental health needs  Provided education to patient re: doing mindfulness activities, journal writing, creative ways to help her when she is stressed out.  Reviewed  medications with patient and discussed compliance. States she is compliant with medications Collaborated with LCSW and scheduling care guides regarding the need for follow up by LCSW. The LCSW has worked with the patient before for depression and other needs. She is working with the SW for additional and ongoing support.  Provided patient with resources and support for effective management of depression  educational materials related to managing her stress level and depression  Reviewed scheduled/upcoming provider appointments including 01-24-2023 with PCP Care Guide referral for food insecurities and scheduling follow up with LCSW Social Work referral for support and education needs related to depression and stress. Ongoing support and education from the LCSW.  Discussed plans with patient for ongoing care management follow up and provided patient with direct contact information for care management team Advised patient to discuss changes in her depression and anxiety level, questions and concerns on how to best manage her chronic care needs due to limited support system with provider Screening for signs and symptoms of depression related to chronic disease state  Assessed social determinant of health barriers    Symptom Management: Take medications as prescribed   Attend all scheduled provider appointments Call provider office for new concerns or questions  call the Suicide and Crisis Lifeline: 988 call the [place] National Suicide Prevention Lifeline: (925) 336-5514 or TTY: (434) 873-0145 TTY (984)549-9195) to talk to a trained counselor call 1-800-273-TALK (toll free, 24 hour hotline) if experiencing a Mental Health or Behavioral Health Crisis   Follow Up Plan: Telephone follow up appointment with care management team member scheduled for: 02-02-2023 at 9:45 am       RNCM Care Management Expected Outcome:  Monitor, Self-Manage, and Reduce Symptoms of Hypertension       Current Barriers:  Chronic  Disease Management support and education needs related to effective management of HTN Lacks caregiver support.   BP Readings from Last 3 Encounters:  12/21/22 122/64  12/20/22 133/67  10/13/22 128/80    Planned Interventions: Evaluation of current treatment plan related to hypertension self management and patient's adherence to plan as established by provider.  She denies any light headedness or dizziness. Regularly checks blood pressure and states it on 12-28-2022 it was 116/75. She states her blood pressure ranges 110s/60s. Provided education to patient re: stroke prevention, s/s of heart attack and stroke; Reviewed prescribed diet heart  healthy/ADA diet.  Reviewed medications with patient and discussed importance of compliance. Reports compliance with her medications Discussed plans with patient for ongoing care management follow up and provided patient with direct contact information for care management team; Advised patient, providing education and rationale, to monitor blood pressure daily and record, calling PCP for findings outside established parameters;  Reviewed scheduled/upcoming provider appointments including: 01-24-2024 with PCP Advised patient to discuss changes in blood pressures and heart health with provider; Provided education on prescribed diet heart healthy/ADA diet ;  Discussed complications of poorly controlled blood pressure such as heart disease, stroke, circulatory complications, vision complications, kidney impairment, sexual dysfunction;  Screening for signs and symptoms of depression related to chronic disease state;  Assessed social determinant of health barriers;   Symptom Management: Take medications as prescribed   Attend all scheduled provider appointments Call provider office for new concerns or questions  call the Suicide and Crisis Lifeline: 988 call the [place] National Suicide Prevention Lifeline: (701) 799-8502 or TTY: 929-842-3438 TTY 608 276 6026)  to talk to a trained counselor call 1-800-273-TALK (toll free, 24 hour hotline) if experiencing a Mental Health or Behavioral Health Crisis  check blood pressure weekly learn about high blood pressure call doctor for signs and symptoms of high blood pressure develop an action plan for high blood pressure keep all doctor appointments take medications for blood pressure exactly as prescribed report new symptoms to your doctor  Follow Up Plan: Telephone follow up appointment with care management team member scheduled for: 02-02-2023 at 9:45 am            Consent to Services:  Patient was given information about care management services, agreed to services, and gave verbal consent to participate.   Plan: Telephone follow up appointment with care management team member scheduled for:02-02-2023 at 9:45 am  Danise Edge, BSN RN RN Care Manager  [facility]  Ambulatory Care Management  Direct Number: [phone]

## 2022-12-29 NOTE — Telephone Encounter (Signed)
Pharmacy is already set at [facility] in [place]. Confirmed that [facility] is the correct pharmacy.

## 2022-12-29 NOTE — Patient Instructions (Signed)
Visit Information  Thank you for taking time to visit with me today. Please don't hesitate to contact me if I can be of assistance to you before our next scheduled telephone appointment.  Following are the goals we discussed today:   Goals Addressed             This Visit's Progress    RNCM Care Management Expected Outcome:  Monitor, Self-Manage and Reduce Symptoms of Diabetes       Current Barriers:  Knowledge Deficits related to concerns about places on her toes and not being able to be seen by podiatrist until next week, the patient expressed concern over being diabetic and not wanting to lose her toes Care Coordination needs related to resources in the area to help with her expressed needs and caring for her self due to lack of support system in a patient with DM Chronic Disease Management support and education needs related to effective management of DM Lacks caregiver support.  Lab Results  Component Value Date   HGBA1C 6.7 (H) 12/21/2022     Planned Interventions: Provided education to patient about basic DM disease process. Verbalizes understanding of her diabetes as it relates to proper wound healing.  Reviewed medications with patient and discussed importance of medication adherence. The patient is compliant with her medications. Denies any acute changes in her medications. ;        Reviewed prescribed diet with patient heart healthy/ADA diet. Is compliant with her diet; Counseled on importance of regular laboratory monitoring as prescribed. Has regular labwork ;        Discussed plans with patient for ongoing care management follow up and provided patient with direct contact information for care management team;      Provided patient with written educational materials related to hypo and hyperglycemia and importance of correct treatment. The lowest the patient has seen is 96 and the highest is 196; Fasting: 115. She states typically she ranges from 120's-130's.      Reviewed  scheduled/upcoming provider appointments including: 01-24-2023 with PCP.       Advised patient, providing education and rationale, to check cbg when you have symptoms of low or high blood sugar and has a continuous glucose reader, freestyle [place]  and record. Patient states she wants RNCM to follow up with provider about switching her meter to a Libre 3 at the recommendation of her health plan nurse stating it could provide her with her A1C. RNCM to follow up with PCP/PharmD. Referral made to pharmacy team for assistance with ongoing support and education for medication needs ;       Referral made to social work team for assistance with ongoing support and education for effective management of stress, anxiety, depression ;      Referral made to community resources care guide team for assistance with food resources and community resources;      Review of patient status, including review of consultants reports, relevant laboratory and other test results, and medications completed;       Advised patient to discuss changes in her DM and questions and concerns with provider;      Screening for signs and symptoms of depression related to chronic disease state;        Assessed social determinant of health barriers;        The patient states she is having more pain in her legs and feet and it is sometimes unbearable. The patient states that she has had a change in  her medications but cannot tell that it has helped. Ask for recommendations. Was fitted yesterday for therapeutic shoes. She is seeing the specialist and is following up next week with the specialist. Will continue to monitor.    Symptom Management: Take medications as prescribed   Attend all scheduled provider appointments Call provider office for new concerns or questions  call the Suicide and Crisis Lifeline: 988 call the [place] National Suicide Prevention Lifeline: 858-636-4106 or TTY: 575 703 9095 TTY 774-131-5233) to talk to a trained  counselor call 1-800-273-TALK (toll free, 24 hour hotline) if experiencing a Mental Health or Behavioral Health Crisis  keep appointment with eye doctor check feet daily for cuts, sores or redness trim toenails straight across manage portion size wash and dry feet carefully every day wear comfortable, cotton socks wear comfortable, well-fitting shoes  Follow Up Plan: Telephone follow up appointment with care management team member scheduled for: 02-02-2023 at 9:45 am       [facility] Care Management Expected Outcome:  Monitor, Self-Manage and Reduce Symptoms of HLD       Current Barriers:  Chronic Disease Management support and education needs related to effective management of HLD Lab Results  Component Value Date   CHOL 149 09/14/2022   HDL 43 09/14/2022   LDLCALC 76 09/14/2022   TRIG 175 (H) 09/14/2022   CHOLHDL 3.5 09/14/2022     Planned Interventions: Provider established cholesterol goals reviewed. Review of labs. The patient is above goal on her triglycerides. Education and support given. ; Counseled on importance of regular laboratory monitoring as prescribed. ; Provided HLD educational materials; Reviewed role and benefits of statin for ASCVD risk reduction. Compliance with Lipitor. Discussed strategies to manage statin-induced myalgias. Denies myalgias. Reviewed importance of limiting foods high in cholesterol. Education and support provided on a heart healthy/ADA diet. The patient tries to watch what she is eating.  Screening for signs and symptoms of depression related to chronic disease state;  Assessed social determinant of health barriers;   Symptom Management: Take medications as prescribed   Attend all scheduled provider appointments Call provider office for new concerns or questions  call the Suicide and Crisis Lifeline: 988 call the [place] National Suicide Prevention Lifeline: 902-222-4754 or TTY: 727 611 6130 TTY [phone]) to talk to a trained counselor call  1-800-273-TALK (toll free, 24 hour hotline) if experiencing a Mental Health or Behavioral Health Crisis  - take all medications exactly as prescribed - call doctor with any symptoms you believe are related to your medicine - call doctor when you experience any new symptoms - go to all doctor appointments as scheduled - adhere to prescribed diet: heart healthy/ADA diet   Follow Up Plan: Telephone follow up appointment with care management team member scheduled for: 02-02-2023 at 9:45 am       RNCM Care Management Expected Outcome:  Monitor, Self-Manage and Reduce Symptoms of: Depression       Current Barriers:  Knowledge Deficits related to how to manage emotions and stress level in a patient with multiple chronic conditions including depression Care Coordination needs related to resources in the community for food insecurities and additional support for social worker support  in a patient with depression and increased stress level Chronic Disease Management support and education needs related to effective management of depression  Lacks caregiver support.   Planned Interventions: Evaluation of current treatment plan related to depression  and patient's adherence to plan as established by provider. The patient states she has good days and bad days. 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 She states podiatry is following closely.  She has surgery for amputation scheduled for 01-04-2023 with post op follow up scheduled for 01-10-2023 with podiatry. Advised patient to call the office for changes in mood, anxiety, depression, stress level, or mental health needs  Provided education to patient re: doing mindfulness activities, journal writing, creative ways to help her when she is stressed out.  Reviewed medications with patient and discussed compliance. States she is compliant with medications Collaborated with LCSW  and scheduling care guides regarding the need for follow up by LCSW. The LCSW has worked with the patient before for depression and other needs. She is working with the SW for additional and ongoing support.  Provided patient with resources and support for effective management of depression  educational materials related to managing her stress level and depression  Reviewed scheduled/upcoming provider appointments including 01-24-2023 with PCP Care Guide referral for food insecurities and scheduling follow up with LCSW Social Work referral for support and education needs related to depression and stress. Ongoing support and education from the LCSW.  Discussed plans with patient for ongoing care management follow up and provided patient with direct contact information for care management team Advised patient to discuss changes in her depression and anxiety level, questions and concerns on how to best manage her chronic care needs due to limited support system with provider Screening for signs and symptoms of depression related to chronic disease state  Assessed social determinant of health barriers    Symptom Management: Take medications as prescribed   Attend all scheduled provider appointments Call provider office for new concerns or questions  call the Suicide and Crisis Lifeline: 988 call the [place] National Suicide Prevention Lifeline: 316-056-1082 or TTY: 380-439-3636 TTY 251 308 7902) to talk to a trained counselor call 1-800-273-TALK (toll free, 24 hour hotline) if experiencing a Mental Health or Behavioral Health Crisis   Follow Up Plan: Telephone follow up appointment with care management team member scheduled for: 02-02-2023 at 9:45 am       RNCM Care Management Expected Outcome:  Monitor, Self-Manage, and Reduce Symptoms of Hypertension       Current Barriers:  Chronic Disease Management support and education needs related to effective management of HTN Lacks caregiver support.    BP Readings from Last 3 Encounters:  12/21/22 122/64  12/20/22 133/67  10/13/22 128/80    Planned Interventions: Evaluation of current treatment plan related to hypertension self management and patient's adherence to plan as established by provider.  She denies any light headedness or dizziness. Regularly checks blood pressure and states it on 12-28-2022 it was 116/75. She states her blood pressure ranges 110s/60s. Provided education to patient re: stroke prevention, s/s of heart attack and stroke; Reviewed prescribed diet heart  healthy/ADA diet.  Reviewed medications with patient and discussed importance of compliance. Reports compliance with her medications Discussed plans with patient for ongoing care management follow up and provided patient with direct contact information for care management team; Advised patient, providing education and rationale, to monitor blood pressure daily and record, calling PCP for findings outside established parameters;  Reviewed scheduled/upcoming provider appointments including: 01-24-2024 with PCP Advised patient to discuss changes in blood pressures and heart health with provider; Provided education on prescribed diet heart healthy/ADA diet ;  Discussed complications of poorly controlled blood pressure such as heart disease, stroke, circulatory complications, vision complications, kidney  impairment, sexual dysfunction;  Screening for signs and symptoms of depression related to chronic disease state;  Assessed social determinant of health barriers;   Symptom Management: Take medications as prescribed   Attend all scheduled provider appointments Call provider office for new concerns or questions  call the Suicide and Crisis Lifeline: 988 call the [place] National Suicide Prevention Lifeline: (226) 143-6805 or TTY: 939-788-5418 TTY 562-094-3446) to talk to a trained counselor call 1-800-273-TALK (toll free, 24 hour hotline) if experiencing a Mental Health  or Behavioral Health Crisis  check blood pressure weekly learn about high blood pressure call doctor for signs and symptoms of high blood pressure develop an action plan for high blood pressure keep all doctor appointments take medications for blood pressure exactly as prescribed report new symptoms to your doctor  Follow Up Plan: Telephone follow up appointment with care management team member scheduled for: 02-02-2023 at 9:45 am           Our next appointment is by telephone on 02-02-2023 at 9:45 am  Please call the care guide team at [phone] if you need to cancel or reschedule your appointment.   If you are experiencing a Mental Health or Behavioral Health Crisis or need someone to talk to, please call the Suicide and Crisis Lifeline: 988 call the [place] National Suicide Prevention Lifeline: 732-236-2647 or TTY: 386-286-2669 TTY 253-348-0141) to talk to a trained counselor call 1-800-273-TALK (toll free, 24 hour hotline) call 911   Patient verbalizes understanding of instructions and care plan provided today and agrees to view in MyChart. Active MyChart status and patient understanding of how to access instructions and care plan via MyChart confirmed with patient.     Telephone follow up appointment with care management team member scheduled for:02-02-2023 at 9:45 am  Danise Edge, BSN RN RN Care Manager  [facility]  Ambulatory Care Management  Direct Number: [phone]

## 2022-12-29 NOTE — Telephone Encounter (Signed)
Pt called stating that the medication was sent to the wrong Pharmacy. She uses [facility] in [place]. Please Advise.

## 2023-01-02 ENCOUNTER — Telehealth: Payer: Self-pay | Admitting: Urology

## 2023-01-02 ENCOUNTER — Encounter: Payer: Self-pay | Admitting: Family Medicine

## 2023-01-02 ENCOUNTER — Ambulatory Visit (INDEPENDENT_AMBULATORY_CARE_PROVIDER_SITE_OTHER): Payer: PPO | Admitting: Family Medicine

## 2023-01-02 VITALS — BP 140/78 | HR 123 | Temp 97.8°F | Ht 62.0 in | Wt 206.0 lb

## 2023-01-02 DIAGNOSIS — E039 Hypothyroidism, unspecified: Secondary | ICD-10-CM | POA: Diagnosis not present

## 2023-01-02 DIAGNOSIS — Z01818 Encounter for other preprocedural examination: Secondary | ICD-10-CM

## 2023-01-02 DIAGNOSIS — E559 Vitamin D deficiency, unspecified: Secondary | ICD-10-CM

## 2023-01-02 DIAGNOSIS — E782 Mixed hyperlipidemia: Secondary | ICD-10-CM

## 2023-01-02 DIAGNOSIS — E1140 Type 2 diabetes mellitus with diabetic neuropathy, unspecified: Secondary | ICD-10-CM | POA: Diagnosis not present

## 2023-01-02 DIAGNOSIS — N1832 Chronic kidney disease, stage 3b: Secondary | ICD-10-CM

## 2023-01-02 DIAGNOSIS — M869 Osteomyelitis, unspecified: Secondary | ICD-10-CM | POA: Diagnosis not present

## 2023-01-02 DIAGNOSIS — M86271 Subacute osteomyelitis, right ankle and foot: Secondary | ICD-10-CM | POA: Diagnosis not present

## 2023-01-02 DIAGNOSIS — I129 Hypertensive chronic kidney disease with stage 1 through stage 4 chronic kidney disease, or unspecified chronic kidney disease: Secondary | ICD-10-CM | POA: Diagnosis not present

## 2023-01-02 DIAGNOSIS — F1120 Opioid dependence, uncomplicated: Secondary | ICD-10-CM

## 2023-01-02 DIAGNOSIS — R000 Tachycardia, unspecified: Secondary | ICD-10-CM

## 2023-01-02 DIAGNOSIS — E1122 Type 2 diabetes mellitus with diabetic chronic kidney disease: Secondary | ICD-10-CM

## 2023-01-02 DIAGNOSIS — Z794 Long term (current) use of insulin: Secondary | ICD-10-CM | POA: Diagnosis not present

## 2023-01-02 MED ORDER — METOPROLOL SUCCINATE ER 25 MG PO TB24: 25.0000 mg | ORAL_TABLET | Freq: Every day | ORAL | 0 refills | Status: DC

## 2023-01-02 MED ORDER — FREESTYLE LIBRE 3 PLUS SENSOR MISC: 1 refills | Status: DC

## 2023-01-02 NOTE — Telephone Encounter (Signed)
Called pt and let her know that we don't have her H&P for her sx on 12/11 with Dr. Annamary Rummage, pt stated she would call and make an appt.

## 2023-01-02 NOTE — Patient Instructions (Signed)
Start metoprolol xl 25 mg daily.

## 2023-01-02 NOTE — Progress Notes (Unsigned)
Subjective:  Patient ID: Linda [name], female    DOB: Jul 06, 1970  Age: 52 y.o. MRN: [id number]  Chief Complaint  Patient presents with   Pre-Op Clearance    HPI Patient is here today for Pre-Op clearance to have Right 3rd toe amputation.  The patient, with a history of diabetes and osteomyelitis, presents for preoperative clearance for a right third toe amputation. She reports a high heart rate, which she attributes to stress about the upcoming surgery. She denies chest pain or breathing problems. She also reports a history of breast cancer and chemotherapy, which was interrupted due to foot complications. She has been experiencing ongoing issues with her right foot, including a previous partial first toe amputation. The third toe is described as looking 'really bad,' but she is unsure if it is infected. She reports that her doctor wanted her on antibiotics before the surgery, but there has been confusion with the pharmacy and she has not received the medication.   Idiopathic thrombocytopenia: She also expresses concern about her platelet count due to a history of low platelets.  Diabetes:  Complications: HYPERTENSION, CKD Glucose checking: CGM Free Style Libre 2. Glucose logs: see download below.  Hypoglycemia: rarely  Most recent A1C: 6.7. Current medications: Mounjaro 7.5mg  once weekly, Synjardy 12.5 mg/ 1000 mg 1 tablet twice daily, Tresiba inject 60 units daily. Last Eye Exam: scheduled for August 2024.  Foot checks: Daily  Hyperlipidemia: Current medications: Taking Atorvastatin 10 mg 1 tablet daily, lovaza 1 gm 2 capsules twice daily.  The patient, with a history of hyperlipidemia, recently started Repatha injections. She reports that the needle is too thick and would prefer a finer needle. She also mentions that she is not approved for the [employer] autoinjector due to insurance reasons. She has been making dietary changes, including switching to almond milk and  consuming a pea protein slushy with strawberries and grains. She reports no new symptoms and is generally feeling well.  Hypertensive kidney disease with Stage 3B chronic kidney disease: Complications: DM Current medications: Losartan potassium 50 mg take 1 tablet daily, Potassium Chloride 10 meq take 2 capsules twice daily.  Acquired Hypothyroidism: Patient is currently taking Levothyroxine 75 mcg daily.  Chronic Pain Syndrome: Chronic back pain has worsened. On Morphine 30 mg take 1 tablet twice daily, Gabapentin 300 mg take 2 capsules three times a day.   Binge eating disorder: Patient is currently taking Vyvanse 70 mg daily.  Vitamin D deficiency: Patient is currently taking Vitamin D 50,000 units 1 capsule weekly.  GERD:  Pantoprazole 40 mg 1 tablet TWICE DAILY.  Depression, mild, recurrent: Patient is currently taking Fetzima 80 mg daily, Bupropion XL 300 mg 1 tablet daily, Buspirone 5 mg take 1 tablet three times a day, vraylar 3 mg daily.   Insomnia: rozerem 8 mg before bed.   IBS D: has dicyclomine that uses as needed.          01/02/2023    2:01 PM 10/04/2022    3:49 PM 09/14/2022    8:01 AM 08/17/2022   10:17 AM 06/21/2022    3:48 PM  Depression screen PHQ 2/9  Decreased Interest 1 1 1 1 2   Down, Depressed, Hopeless 1 1 1 1 2   PHQ - 2 Score 2 2 2 2 4   Altered sleeping 2 0 3 1 2   Tired, decreased energy 2 1 2 1 2   Change in appetite 1 1 2 1 1   Feeling bad or failure about yourself  0  1 0 1 2  Trouble concentrating 1 0 0 1 2  Moving slowly or fidgety/restless 2 1 1 1 1   Suicidal thoughts 0 0 0 0 0  PHQ-9 Score 10 6 10 8 14   Difficult doing work/chores Somewhat difficult Somewhat difficult Somewhat difficult Somewhat difficult Somewhat difficult        01/02/2023    2:01 PM  Fall Risk   Falls in the past year? 1  Number falls in past yr: 0  Injury with Fall? 0  Risk for fall due to : No Fall Risks  Follow up Falls evaluation completed    Patient Care  Team: [name], MD as PCP - General (Internal Medicine) [name], MD as Consulting Physician (Oncology) Ricky Stabs, RN as [facility] Care Management (General Practice)   Review of Systems  Constitutional:  Negative for appetite change, fatigue and fever.  HENT:  Negative for congestion, ear pain, sinus pressure and sore throat.   Respiratory:  Negative for cough, chest tightness, shortness of breath and wheezing.   Cardiovascular:  Negative for chest pain and palpitations.  Gastrointestinal:  Negative for abdominal pain, constipation, diarrhea, nausea and vomiting.  Genitourinary:  Negative for dysuria and hematuria.  Musculoskeletal:  Negative for arthralgias, back pain, joint swelling and myalgias.  Skin:  Negative for rash.  Neurological:  Negative for dizziness, weakness and headaches.  Psychiatric/Behavioral:  Negative for dysphoric mood. The patient is not nervous/anxious.     Current Outpatient Medications on File Prior to Visit  Medication Sig Dispense Refill   acetaminophen (TYLENOL) 500 MG tablet Take 500 mg by mouth every 6 (six) hours as needed for moderate pain or mild pain.     albuterol (VENTOLIN HFA) 108 (90 Base) MCG/ACT inhaler INHALE TWO PUFFS BY MOUTH INTO LUNGS every SIX hours orn FOR WHEEZING AND/OR SHORTNESS OF BREATH 8.5 g 1   amitriptyline (ELAVIL) 25 MG tablet TAKE ONE TABLET BY MOUTH AT BEDTIME 30 tablet 5   atorvastatin (LIPITOR) 10 MG tablet TAKE ONE TABLET BY MOUTH EVERY EVENING 90 tablet 0   Blood Glucose Monitoring Suppl (ONETOUCH VERIO REFLECT) w/Device KIT AS DIRECTED     Blood Pressure Monitoring (SPHYGMOMANOMETER) MISC 1 each by Does not apply route daily in the afternoon. 1 each 0   buPROPion (WELLBUTRIN XL) 300 MG 24 hr tablet Take 1 tablet (300 mg total) by mouth every morning. 90 tablet 3   busPIRone (BUSPAR) 5 MG tablet Take 1 tablet (5 mg total) by mouth 3 (three) times daily. 90 tablet 5   clotrimazole-betamethasone (LOTRISONE)  cream Apply twice daily as needed to area of concern 45 g 2   cyclobenzaprine (FLEXERIL) 10 MG tablet TAKE ONE TABLET BY MOUTH every EIGHT hours AS NEEDED FOR MUSCLE SPASMS 270 tablet 1   dicyclomine (BENTYL) 20 MG tablet TAKE ONE TABLET BY MOUTH BEFORE MEALS AND AT BEDTIME AS NEEDED FOR STOMACH CRAMPING 180 tablet 1   ferrous sulfate 325 (65 FE) MG tablet Take 325 mg by mouth every evening.     gabapentin (NEURONTIN) 400 MG capsule TAKE 2 CAPSULES BY MOUTH 3 TIMES DAILY 180 capsule 1   glucose blood test strip Use as instructed to check FBS daily. E11.40 100 each 12   Insulin Pen Needle (BD PEN NEEDLE NANO U/F) 32G X 4 MM MISC Use new needle with each injection. 30 each 2   Lancets (ONETOUCH DELICA PLUS LANCET30G) MISC USE TO check blood glucose 2-3 times daily AS DIRECTED 100 each  3   Levomilnacipran HCl ER (FETZIMA) 80 MG CP24 Take 1 capsule by mouth every evening. 90 capsule 1   levothyroxine (SYNTHROID) 75 MCG tablet Take 1 tablet (75 mcg total) by mouth daily before breakfast. 90 tablet 3   losartan (COZAAR) 50 MG tablet TAKE ONE TABLET BY MOUTH EVERY EVENING 90 tablet 1   Magnesium 500 MG CAPS Take 1 capsule (500 mg total) by mouth daily. 90 capsule 1   morphine (MS CONTIN) 30 MG 12 hr tablet Take 1 tablet (30 mg total) by mouth every 12 (twelve) hours. 60 tablet 0   MOUNJARO 7.5 MG/0.5ML Pen INJECT 7.5 MG INTO THE SKIN EVERY WEEK 2 mL 1   Multiple Vitamin (MULTIVITAMIN WITH MINERALS) TABS tablet Take 1 tablet by mouth daily. Solar ray     mupirocin ointment (BACTROBAN) 2 % Apply 1 Application topically daily. 22 g 0   omega-3 acid ethyl esters (LOVAZA) 1 g capsule TAKE TWO CAPSULES BY MOUTH TWICE DAILY 360 capsule 1   ondansetron (ZOFRAN) 4 MG tablet Take 1 tablet (4 mg total) by mouth every 4 (four) hours as needed for nausea. 90 tablet 3   OVER THE COUNTER MEDICATION Take 1 tablet by mouth in the morning and at bedtime. Lutein for eyes     pantoprazole (PROTONIX) 40 MG tablet Take 1  tablet (40 mg total) by mouth 2 (two) times daily. 180 tablet 1   polyethylene glycol (MIRALAX / GLYCOLAX) 17 g packet Take 17 g by mouth daily as needed for moderate constipation.     potassium chloride (MICRO-K) 10 MEQ CR capsule Take 2 capsules (20 mEq total) by mouth 2 (two) times daily. 360 capsule 3   Probiotic Product (PROBIOTIC PO) Take 1 capsule by mouth in the morning. 3.2 billion cfu     prochlorperazine (COMPAZINE) 5 MG tablet TAKE ONE TABLET BY MOUTH every SIX hours AS NEEDED FOR NAUSEA AND VOMITING 30 tablet 1   ramelteon (ROZEREM) 8 MG tablet Take 1 tablet (8 mg total) by mouth at bedtime. 90 tablet 1   rOPINIRole (REQUIP) 0.25 MG tablet TAKE ONE TABLET BY MOUTH EVERYDAY AT BEDTIME 90 tablet 1   SYNJARDY 12.05-998 MG TABS TAKE ONE TABLET BY MOUTH TWICE DAILY 60 tablet 1   TRESIBA FLEXTOUCH 200 UNIT/ML FlexTouch Pen INJECT 60 UNITS SUBCUTANEOUSLY DAILY 9 mL 3   Vitamin D, Ergocalciferol, (DRISDOL) 1.25 MG (50000 UNIT) CAPS capsule TAKE ONE CAPSULE BY MOUTH ONCE WEEKLY ON FRIDAY 12 capsule 1   VRAYLAR 3 MG capsule TAKE ONE CAPSULE BY MOUTH EVERY DAY 90 capsule 0   VYVANSE 70 MG capsule TAKE ONE CAPSULE BY MOUTH EVERY DAY 30 capsule 0   clindamycin (CLEOCIN) 300 MG capsule Take 1 capsule (300 mg total) by mouth 3 (three) times daily for 7 days. 21 capsule 0   Continuous Blood Gluc Receiver (FREESTYLE LIBRE 2 READER) DEVI E11.69 Check blood sugar 4 times daily as directed 1 each 0   Current Facility-Administered Medications on File Prior to Visit  Medication Dose Route Frequency Provider Last Rate Last Admin   sodium chloride flush (NS) 0.9 % injection 10 mL  10 mL Intracatheter PRN Mosher, Kelli A, PA-C   10 mL at 02/19/21 1004   Past Medical History:  Diagnosis Date   Acute postoperative respiratory insufficiency 04/17/2020   AKI (acute kidney injury) (HCC) 04/17/2020   Anemia    Anxiety    Arthritis    Blood dyscrasia    ITP   BRCA  gene mutation positive    Chronic pain  syndrome    Cirrhosis of liver not due to alcohol (HCC)    Complication of anesthesia    Difficulty waking up   Depression    Diabetes mellitus without complication (HCC)    type 2   Diabetic ulcer of toe of left foot associated with type 2 diabetes mellitus, with fat layer exposed (HCC) 03/27/2020   Gastritis    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 No longer has   Past Surgical History:  Procedure Laterality Date   AMPUTATION TOE Bilateral 03/04/2022   Procedure: AMPUTATION TOE RIGHT FOOT PARTIAL OR TOTAL GREAT TOE AND SECOND TOE, LEFT FOOT PARTIAL OR TOTAL GREAT TOE, SECOND AND THIRD TOE;  Surgeon: Pilar Plate, DPM;  Location: [facility] OR;  Service: Podiatry;  Laterality: Bilateral;   AMPUTATION TOE Right 01/04/2023   Procedure: R 1ST TOE AMPUTATION, RIGHT 3RD TOE AMPUTATION;  Surgeon: Pilar Plate, DPM;  Location: [facility];  Service: Orthopedics/Podiatry;  Laterality: Right;   APPENDECTOMY     BACK SURGERY     x 3 lower disc   BILATERAL TOTAL MASTECTOMY WITH AXILLARY LYMPH NODE DISSECTION Bilateral 09/2019   CHOLECYSTECTOMY     nephrolithiasis     ROBOTIC ASSISTED TOTAL HYSTERECTOMY WITH BILATERAL SALPINGO OOPHERECTOMY Bilateral 06/14/2016   Procedure: ROBOTIC ASSISTED TOTAL HYSTERECTOMY WITH BILATERAL SALPINGO OOPHORECTOMY;  Surgeon: Cleda Mccreedy, MD;  Location: [facility];   Service: Gynecology;  Laterality: Bilateral;    Family History  Problem Relation Age of Onset   Cancer Mother        breast   CAD Father    Diabetes Father    Heart failure Father    Cancer Father        renal carcinoma   Kidney failure Father    Cancer Brother 62       renal carcinoma.   Stroke Paternal Grandmother    Social History   Socioeconomic History   Marital status: Divorced    Spouse name: Not on file   Number of children: Not on file   Years of education: Not on file   Highest education level: Associate degree: occupational, Scientist, product/process development, or vocational program  Occupational History   Occupation: disabled  Tobacco Use   Smoking status: Never   Smokeless tobacco: Never  Vaping Use   Vaping status: Never Used  Substance and Sexual Activity   Alcohol use: No   Drug use: No   Sexual activity: Yes  Other Topics Concern   Not on file  Social History Narrative   wears sunscreen, brushes and flosses daily, see's dentist bi-annually, has smoke/carbon monoxide detectors, wears a seatbelt and practices gun safety   Social Drivers of Health   Financial Resource Strain: Medium Risk (09/13/2022)   Overall Physicist, medical  Strain (CARDIA)    Difficulty of Paying Living Expenses: Somewhat hard  Food Insecurity: Food Insecurity Present (09/13/2022)   Hunger Vital Sign    Worried About Running Out of Food in the Last Year: Sometimes true    Ran Out of Food in the Last Year: Never true  Transportation Needs: No Transportation Needs (09/13/2022)   PRAPARE - Administrator, Civil Service (Medical): No    Lack of Transportation (Non-Medical): No  Physical Activity: Inactive (10/04/2022)   Exercise Vital Sign    Days of Exercise per Week: 0 days    Minutes of Exercise per Session: 0 min  Stress: Stress Concern Present (10/04/2022)   [employer] of Occupational Health - Occupational Stress Questionnaire    Feeling of Stress : Rather much  Social Connections:  Moderately Isolated (09/13/2022)   Social Connection and Isolation Panel [NHANES]    Frequency of Communication with Friends and Family: More than three times a week    Frequency of Social Gatherings with Friends and Family: Once a week    Attends Religious Services: More than 4 times per year    Active Member of [employer] or Organizations: No    Attends Engineer, structural: Never    Marital Status: Divorced    Objective:  BP (!) 140/78 (BP Location: Left Arm, Patient Position: Sitting)   Pulse (!) 123   Temp 97.8 F (36.6 C) (Temporal)   Ht 5\' 2"  (1.575 m)   Wt 206 lb (93.4 kg)   LMP 06/13/2009   SpO2 98%   BMI 37.68 kg/m      01/07/2023    2:43 PM 01/07/2023    1:30 PM 01/07/2023   12:09 PM  BP/Weight  Systolic BP 123 131   Diastolic BP 77 73   Wt. (Lbs)   206  BMI   37.68 kg/m2    Physical Exam Vitals reviewed.  Constitutional:      Appearance: Normal appearance. She is obese.  Neck:     Vascular: No carotid bruit.  Cardiovascular:     Rate and Rhythm: Regular rhythm. Tachycardia present.     Heart sounds: Normal heart sounds.  Pulmonary:     Effort: Pulmonary effort is normal. No respiratory distress.     Breath sounds: Normal breath sounds.  Abdominal:     General: Abdomen is flat. Bowel sounds are normal.     Palpations: Abdomen is soft.     Tenderness: There is no abdominal tenderness.  Neurological:     Mental Status: She is alert and oriented to person, place, and time.  Psychiatric:        Mood and Affect: Mood normal.        Behavior: Behavior normal.      Diabetic Foot Exam - Simple   No data filed      Lab Results  Component Value Date   WBC 3.9 (L) 01/07/2023   HGB 11.1 (L) 01/07/2023   HCT 36.5 01/07/2023   PLT 65 (L) 01/07/2023   GLUCOSE 141 (H) 12/21/2022   CHOL 149 09/14/2022   TRIG 175 (H) 09/14/2022   HDL 43 09/14/2022   LDLCALC 76 09/14/2022   ALT 17 12/21/2022   AST 24 12/21/2022   NA 140 12/21/2022   K 4.0  12/21/2022   CL 105 12/21/2022   CREATININE 1.00 12/21/2022   BUN 17 12/21/2022   CO2 25 12/21/2022   TSH 1.270 01/02/2023   INR 1.1 12/20/2022   HGBA1C  6.7 (H) 12/21/2022   MICROALBUR 30 10/09/2019      Assessment & Plan:    Pre-operative examination Assessment & Plan: EKG abnormal. Check labs. Start on metoprolol xl 25 mg daily.  Cleared for surgery. -Continue current medications as prescribed. -Follow-up after surgery to assess recovery and manage medications.  Orders: -     EKG 12-Lead  Sinus tachycardia Assessment & Plan: Increased heart rate reported, possibly due to anxiety related to upcoming surgery. No chest pain or breathing problems reported. However with history of elevated heart rate, I would start on metoprolol xl 25 mg daily. . -Order Holter monitor to assess heart rate over an extended period.  Orders: -     CBC with Differential/Platelet -     TSH -     Metoprolol Succinate ER; Take 1 tablet (25 mg total) by mouth daily.  Dispense: 90 tablet; Refill: 0 -     EKG -     LONG TERM MONITOR (3-14 DAYS); Future  Type 2 diabetes mellitus with diabetic neuropathy, with long-term current use of insulin (HCC) Assessment & Plan: At goal.  A1c well controlled at 6.7. -Consider upgrading to [facility] 3 Plus sensor for continuous A1c monitoring, pending insurance approval.  Orders: -     FreeStyle Libre 3 Plus Sensor; Change sensor every 15 days.  Dispense: 6 each; Refill: 1  Osteomyelitis of toe of right foot (HCC) Assessment & Plan: Scheduled for amputation of the third toe on the right foot due to suspected osteomyelitis. Antibiotics were supposed to be prescribed but not received. -Contact Dr. Annamary Rummage to clarify antibiotic prescription. Unnecessary.    Subacute osteomyelitis of right foot (HCC) Assessment & Plan: Cleared for surgery   Mixed hyperlipidemia Assessment & Plan: Well controlled.  No changes to medicines. Taking Atorvastatin 10  mg 1 tablet daily, lovaza 1 gm 2 capsules twice daily. Continue repatha. Continue to work on eating a healthy diet and exercise.  Labs drawn today.     Acquired hypothyroidism Assessment & Plan: Previously well controlled Continue Synthroid at current dose  Recheck TSH and adjust Synthroid as indicated     Uncomplicated opioid dependence (HCC) Assessment & Plan: On morphine for chronic back pain.   Vitamin D insufficiency Assessment & Plan: Continue vitamin D replacement     Meds ordered this encounter  Medications   Continuous Glucose Sensor (FREESTYLE LIBRE 3 PLUS SENSOR) MISC    Sig: Change sensor every 15 days.    Dispense:  6 each    Refill:  1   metoprolol succinate (TOPROL-XL) 25 MG 24 hr tablet    Sig: Take 1 tablet (25 mg total) by mouth daily.    Dispense:  90 tablet    Refill:  0    Orders Placed This Encounter  Procedures   CBC with Differential/Platelet   TSH   LONG TERM MONITOR (3-14 DAYS)   EKG 12-Lead   EKG     Follow-up: previously scheduled chronic visit.    I,Lauren M Auman,acting as a scribe for [name], MD.,have documented all relevant documentation on the behalf of [name], MD,as directed by  [name], MD while in the presence of [name], MD.   Clayborn Bigness I Leal-Borjas,acting as a scribe for [name], MD.,have documented all relevant documentation on the behalf of [name], MD,as directed by  [name], MD while in the presence of [name], MD.    An After Visit Summary was printed and given to the patient.  I attest that I have reviewed this visit and agree with the plan scribed by my staff.   [name], MD [facility] [phone]

## 2023-01-03 ENCOUNTER — Telehealth: Payer: Self-pay

## 2023-01-03 LAB — CBC WITH DIFFERENTIAL/PLATELET
Basophils Absolute: 0 10*3/uL (ref 0.0–0.2)
Basos: 1 %
EOS (ABSOLUTE): 0.2 10*3/uL (ref 0.0–0.4)
Eos: 2 %
Hematocrit: 42.8 % (ref 34.0–46.6)
Hemoglobin: 12.8 g/dL (ref 11.1–15.9)
Immature Grans (Abs): 0 10*3/uL (ref 0.0–0.1)
Immature Granulocytes: 0 %
Lymphocytes Absolute: 1.6 10*3/uL (ref 0.7–3.1)
Lymphs: 24 %
MCH: 25.4 pg — ABNORMAL LOW (ref 26.6–33.0)
MCHC: 29.9 g/dL — ABNORMAL LOW (ref 31.5–35.7)
MCV: 85 fL (ref 79–97)
Monocytes Absolute: 0.4 10*3/uL (ref 0.1–0.9)
Monocytes: 6 %
Neutrophils Absolute: 4.3 10*3/uL (ref 1.4–7.0)
Neutrophils: 67 %
Platelets: 86 10*3/uL — CL (ref 150–450)
RBC: 5.03 x10E6/uL (ref 3.77–5.28)
RDW: 14.4 % (ref 11.7–15.4)
WBC: 6.4 10*3/uL (ref 3.4–10.8)

## 2023-01-03 LAB — TSH: TSH: 1.27 u[IU]/mL (ref 0.450–4.500)

## 2023-01-03 NOTE — Telephone Encounter (Signed)
Copied from CRM [id number]. Topic: Clinical - Lab/Test Results >> Jan 03, 2023 11:42 AM Prudencio Pair wrote: Reason for CRM: Crystal, with [facility], called to provide results for patient. She stated the patient has platelets of 86.

## 2023-01-03 NOTE — Telephone Encounter (Signed)
Provider made aware.

## 2023-01-04 ENCOUNTER — Emergency Department (HOSPITAL_COMMUNITY)
Admission: EM | Admit: 2023-01-04 | Discharge: 2023-01-05 | Disposition: A | Discharge status: Home / Self Care | Payer: PPO | Source: Home / Self Care | Attending: Emergency Medicine | Admitting: Emergency Medicine

## 2023-01-04 ENCOUNTER — Other Ambulatory Visit: Payer: Self-pay

## 2023-01-04 ENCOUNTER — Ambulatory Visit (HOSPITAL_COMMUNITY): Payer: Self-pay | Admitting: Physician Assistant

## 2023-01-04 ENCOUNTER — Ambulatory Visit (HOSPITAL_BASED_OUTPATIENT_CLINIC_OR_DEPARTMENT_OTHER): Payer: PPO | Admitting: Physician Assistant

## 2023-01-04 ENCOUNTER — Encounter (HOSPITAL_COMMUNITY)
Admission: RE | Disposition: A | Discharge status: Home / Self Care | Payer: Self-pay | Source: Home / Self Care | Attending: Podiatry

## 2023-01-04 ENCOUNTER — Encounter (HOSPITAL_COMMUNITY): Payer: Self-pay | Admitting: Podiatry

## 2023-01-04 ENCOUNTER — Ambulatory Visit (HOSPITAL_COMMUNITY): Payer: PPO

## 2023-01-04 ENCOUNTER — Ambulatory Visit (HOSPITAL_COMMUNITY)
Admission: RE | Admit: 2023-01-04 | Discharge: 2023-01-04 | Disposition: A | Discharge status: Home / Self Care | Payer: PPO | Attending: Podiatry | Admitting: Podiatry

## 2023-01-04 DIAGNOSIS — Z5189 Encounter for other specified aftercare: Secondary | ICD-10-CM | POA: Diagnosis not present

## 2023-01-04 DIAGNOSIS — R58 Hemorrhage, not elsewhere classified: Secondary | ICD-10-CM | POA: Diagnosis not present

## 2023-01-04 DIAGNOSIS — Z89421 Acquired absence of other right toe(s): Secondary | ICD-10-CM | POA: Diagnosis not present

## 2023-01-04 DIAGNOSIS — T148XXA Other injury of unspecified body region, initial encounter: Secondary | ICD-10-CM

## 2023-01-04 DIAGNOSIS — E1169 Type 2 diabetes mellitus with other specified complication: Secondary | ICD-10-CM | POA: Diagnosis not present

## 2023-01-04 DIAGNOSIS — Z8249 Family history of ischemic heart disease and other diseases of the circulatory system: Secondary | ICD-10-CM | POA: Diagnosis not present

## 2023-01-04 DIAGNOSIS — Z833 Family history of diabetes mellitus: Secondary | ICD-10-CM | POA: Insufficient documentation

## 2023-01-04 DIAGNOSIS — I10 Essential (primary) hypertension: Secondary | ICD-10-CM | POA: Diagnosis not present

## 2023-01-04 DIAGNOSIS — M86171 Other acute osteomyelitis, right ankle and foot: Secondary | ICD-10-CM | POA: Diagnosis not present

## 2023-01-04 DIAGNOSIS — E039 Hypothyroidism, unspecified: Secondary | ICD-10-CM | POA: Diagnosis not present

## 2023-01-04 DIAGNOSIS — L97519 Non-pressure chronic ulcer of other part of right foot with unspecified severity: Secondary | ICD-10-CM | POA: Diagnosis not present

## 2023-01-04 DIAGNOSIS — Z89411 Acquired absence of right great toe: Secondary | ICD-10-CM | POA: Diagnosis not present

## 2023-01-04 DIAGNOSIS — Z01818 Encounter for other preprocedural examination: Secondary | ICD-10-CM

## 2023-01-04 DIAGNOSIS — M85871 Other specified disorders of bone density and structure, right ankle and foot: Secondary | ICD-10-CM | POA: Diagnosis not present

## 2023-01-04 DIAGNOSIS — G4730 Sleep apnea, unspecified: Secondary | ICD-10-CM | POA: Insufficient documentation

## 2023-01-04 DIAGNOSIS — M19071 Primary osteoarthritis, right ankle and foot: Secondary | ICD-10-CM | POA: Diagnosis not present

## 2023-01-04 DIAGNOSIS — M7989 Other specified soft tissue disorders: Secondary | ICD-10-CM | POA: Diagnosis not present

## 2023-01-04 DIAGNOSIS — E119 Type 2 diabetes mellitus without complications: Secondary | ICD-10-CM | POA: Diagnosis not present

## 2023-01-04 DIAGNOSIS — L7622 Postprocedural hemorrhage and hematoma of skin and subcutaneous tissue following other procedure: Secondary | ICD-10-CM | POA: Diagnosis not present

## 2023-01-04 DIAGNOSIS — E11622 Type 2 diabetes mellitus with other skin ulcer: Secondary | ICD-10-CM

## 2023-01-04 DIAGNOSIS — E11621 Type 2 diabetes mellitus with foot ulcer: Secondary | ICD-10-CM | POA: Diagnosis not present

## 2023-01-04 DIAGNOSIS — M96830 Postprocedural hemorrhage and hematoma of a musculoskeletal structure following a musculoskeletal system procedure: Secondary | ICD-10-CM | POA: Insufficient documentation

## 2023-01-04 DIAGNOSIS — K219 Gastro-esophageal reflux disease without esophagitis: Secondary | ICD-10-CM | POA: Diagnosis not present

## 2023-01-04 DIAGNOSIS — Z4781 Encounter for orthopedic aftercare following surgical amputation: Secondary | ICD-10-CM | POA: Diagnosis not present

## 2023-01-04 DIAGNOSIS — Z9889 Other specified postprocedural states: Secondary | ICD-10-CM | POA: Diagnosis not present

## 2023-01-04 DIAGNOSIS — Z794 Long term (current) use of insulin: Secondary | ICD-10-CM | POA: Insufficient documentation

## 2023-01-04 DIAGNOSIS — M79671 Pain in right foot: Secondary | ICD-10-CM | POA: Diagnosis not present

## 2023-01-04 DIAGNOSIS — E1140 Type 2 diabetes mellitus with diabetic neuropathy, unspecified: Secondary | ICD-10-CM

## 2023-01-04 DIAGNOSIS — M86671 Other chronic osteomyelitis, right ankle and foot: Secondary | ICD-10-CM

## 2023-01-04 DIAGNOSIS — M869 Osteomyelitis, unspecified: Secondary | ICD-10-CM | POA: Insufficient documentation

## 2023-01-04 HISTORY — PX: AMPUTATION TOE: SHX6595

## 2023-01-04 LAB — GLUCOSE, CAPILLARY
Glucose-Capillary: 106 mg/dL — ABNORMAL HIGH (ref 70–99)
Glucose-Capillary: 89 mg/dL (ref 70–99)

## 2023-01-04 SURGERY — AMPUTATION, TOE
Anesthesia: General | Site: Toe | Laterality: Right

## 2023-01-04 MED ORDER — MIDAZOLAM HCL 2 MG/2ML IJ SOLN
INTRAMUSCULAR | Status: AC
  Filled 2023-01-04: qty 2

## 2023-01-04 MED ORDER — PROPOFOL 10 MG/ML IV BOLUS
INTRAVENOUS | Status: AC
  Filled 2023-01-04: qty 20

## 2023-01-04 MED ORDER — MEPERIDINE HCL 50 MG/ML IJ SOLN
6.2500 mg | INTRAMUSCULAR | Status: DC | PRN
Start: 2023-01-04 — End: 2023-01-04

## 2023-01-04 MED ORDER — FENTANYL CITRATE (PF) 100 MCG/2ML IJ SOLN
INTRAMUSCULAR | Status: DC | PRN
  Administered 2023-01-04: 25 ug via INTRAVENOUS

## 2023-01-04 MED ORDER — MIDAZOLAM HCL 5 MG/5ML IJ SOLN
INTRAMUSCULAR | Status: DC | PRN
  Administered 2023-01-04: 1 mg via INTRAVENOUS

## 2023-01-04 MED ORDER — INSULIN ASPART 100 UNIT/ML IJ SOLN: 0.0000 [IU] | INTRAMUSCULAR | Status: DC | PRN

## 2023-01-04 MED ORDER — CHLORHEXIDINE GLUCONATE 0.12 % MT SOLN: 15.0000 mL | Freq: Once | OROMUCOSAL | Status: AC

## 2023-01-04 MED ORDER — 0.9 % SODIUM CHLORIDE (POUR BTL) OPTIME
TOPICAL | Status: DC | PRN
  Administered 2023-01-04: 1000 mL

## 2023-01-04 MED ORDER — DEXAMETHASONE SODIUM PHOSPHATE 10 MG/ML IJ SOLN
INTRAMUSCULAR | Status: AC
Start: 2023-01-04 — End: ?
  Filled 2023-01-04: qty 1

## 2023-01-04 MED ORDER — OXYCODONE HCL 5 MG PO TABS: 5.0000 mg | ORAL_TABLET | Freq: Once | ORAL | Status: DC | PRN

## 2023-01-04 MED ORDER — ORAL CARE MOUTH RINSE
15.0000 mL | Freq: Once | OROMUCOSAL | Status: AC
  Administered 2023-01-04: 15 mL via OROMUCOSAL

## 2023-01-04 MED ORDER — BUPIVACAINE HCL (PF) 0.5 % IJ SOLN
INTRAMUSCULAR | Status: AC
Start: 2023-01-04 — End: ?
  Filled 2023-01-04: qty 30

## 2023-01-04 MED ORDER — CLINDAMYCIN HCL 300 MG PO CAPS: 300.0000 mg | ORAL_CAPSULE | Freq: Three times a day (TID) | ORAL | 0 refills | Status: DC

## 2023-01-04 MED ORDER — ACETAMINOPHEN 325 MG PO TABS: 325.0000 mg | ORAL_TABLET | ORAL | Status: DC | PRN

## 2023-01-04 MED ORDER — PHENYLEPHRINE 80 MCG/ML (10ML) SYRINGE FOR IV PUSH (FOR BLOOD PRESSURE SUPPORT)
PREFILLED_SYRINGE | INTRAVENOUS | Status: AC
  Filled 2023-01-04: qty 10

## 2023-01-04 MED ORDER — CLINDAMYCIN PHOSPHATE 900 MG/50ML IV SOLN
900.0000 mg | Freq: Once | INTRAVENOUS | Status: AC
  Administered 2023-01-04: 900 mg via INTRAVENOUS
  Filled 2023-01-04: qty 50

## 2023-01-04 MED ORDER — ONDANSETRON HCL 4 MG/2ML IJ SOLN
INTRAMUSCULAR | Status: DC | PRN
  Administered 2023-01-04: 4 mg via INTRAVENOUS

## 2023-01-04 MED ORDER — OXYCODONE HCL 5 MG/5ML PO SOLN: 5.0000 mg | Freq: Once | ORAL | Status: DC | PRN

## 2023-01-04 MED ORDER — LACTATED RINGERS IV SOLN: INTRAVENOUS | Status: DC | PRN

## 2023-01-04 MED ORDER — LIDOCAINE HCL (CARDIAC) PF 100 MG/5ML IV SOSY
PREFILLED_SYRINGE | INTRAVENOUS | Status: DC | PRN
  Administered 2023-01-04: 100 mg via INTRAVENOUS

## 2023-01-04 MED ORDER — LIDOCAINE HCL (PF) 2 % IJ SOLN
INTRAMUSCULAR | Status: AC
  Filled 2023-01-04: qty 5

## 2023-01-04 MED ORDER — PROPOFOL 1000 MG/100ML IV EMUL
INTRAVENOUS | Status: AC
Start: 2023-01-04 — End: ?
  Filled 2023-01-04: qty 100

## 2023-01-04 MED ORDER — FENTANYL CITRATE (PF) 100 MCG/2ML IJ SOLN
INTRAMUSCULAR | Status: AC
  Filled 2023-01-04: qty 2

## 2023-01-04 MED ORDER — ONDANSETRON HCL 4 MG/2ML IJ SOLN
INTRAMUSCULAR | Status: AC
  Filled 2023-01-04: qty 2

## 2023-01-04 MED ORDER — FENTANYL CITRATE PF 50 MCG/ML IJ SOSY
25.0000 ug | PREFILLED_SYRINGE | INTRAMUSCULAR | Status: DC | PRN
Start: 2023-01-04 — End: 2023-01-04

## 2023-01-04 MED ORDER — BUPIVACAINE HCL (PF) 0.5 % IJ SOLN
INTRAMUSCULAR | Status: DC | PRN
  Administered 2023-01-04: 20 mL

## 2023-01-04 MED ORDER — LACTATED RINGERS IV SOLN
INTRAVENOUS | Status: DC
Start: 2023-01-04 — End: 2023-01-04

## 2023-01-04 MED ORDER — ACETAMINOPHEN 160 MG/5ML PO SOLN: 325.0000 mg | ORAL | Status: DC | PRN

## 2023-01-04 MED ORDER — PHENYLEPHRINE HCL (PRESSORS) 10 MG/ML IV SOLN
INTRAVENOUS | Status: DC | PRN
  Administered 2023-01-04: 80 ug via INTRAVENOUS
  Administered 2023-01-04 (×4): 160 ug via INTRAVENOUS

## 2023-01-04 MED ORDER — PROPOFOL 500 MG/50ML IV EMUL
INTRAVENOUS | Status: DC | PRN
  Administered 2023-01-04: 80 ug/kg/min via INTRAVENOUS

## 2023-01-04 MED ORDER — CLINDAMYCIN PHOSPHATE 900 MG/50ML IV SOLN: 900.0000 mg | Freq: Once | INTRAVENOUS | Status: DC

## 2023-01-04 MED ORDER — PROPOFOL 500 MG/50ML IV EMUL
INTRAVENOUS | Status: AC
  Filled 2023-01-04: qty 50

## 2023-01-04 MED ORDER — MIDAZOLAM HCL 2 MG/2ML IJ SOLN
INTRAMUSCULAR | Status: AC
Start: 2023-01-04 — End: ?
  Filled 2023-01-04: qty 2

## 2023-01-04 MED ORDER — VANCOMYCIN HCL 1000 MG IV SOLR
INTRAVENOUS | Status: AC
  Filled 2023-01-04: qty 20

## 2023-01-04 MED ORDER — PROPOFOL 10 MG/ML IV BOLUS
INTRAVENOUS | Status: DC | PRN
  Administered 2023-01-04: 60 mg via INTRAVENOUS

## 2023-01-04 MED ORDER — ONDANSETRON HCL 4 MG/2ML IJ SOLN: 4.0000 mg | Freq: Once | INTRAMUSCULAR | Status: DC | PRN

## 2023-01-04 SURGICAL SUPPLY — 34 items
BAG COUNTER SPONGE SURGICOUNT (BAG) IMPLANT
BLADE AVERAGE 25X9 (BLADE) IMPLANT
BLADE OSC/SAG .038X5.5 CUT EDG (BLADE) IMPLANT
BLADE SURG 15 STRL LF DISP TIS (BLADE) IMPLANT
BNDG ELASTIC 4INX 5YD STR LF (GAUZE/BANDAGES/DRESSINGS) IMPLANT
BNDG ELASTIC 6INX 5YD STR LF (GAUZE/BANDAGES/DRESSINGS) ×1 IMPLANT
BNDG GAUZE DERMACEA FLUFF 4 (GAUZE/BANDAGES/DRESSINGS) IMPLANT
BNDG STRETCH 4X75 STRL LF (GAUZE/BANDAGES/DRESSINGS) ×1 IMPLANT
CLEANER TIP ELECTROSURG 2X2 (MISCELLANEOUS) IMPLANT
CNTNR URN SCR LID CUP LEK RST (MISCELLANEOUS) IMPLANT
COVER SURGICAL LIGHT HANDLE (MISCELLANEOUS) ×1 IMPLANT
CUFF TOURN SGL QUICK 18 (TOURNIQUET CUFF) IMPLANT
GAUZE SPONGE 4X4 12PLY STRL (GAUZE/BANDAGES/DRESSINGS) ×1 IMPLANT
GAUZE XEROFORM 1X8 LF (GAUZE/BANDAGES/DRESSINGS) ×1 IMPLANT
GLOVE BIO SURGEON STRL SZ7.5 (GLOVE) ×1 IMPLANT
GLOVE BIOGEL PI IND STRL 8 (GLOVE) ×1 IMPLANT
GOWN SPEC L4 XLG W/TWL (GOWN DISPOSABLE) ×1 IMPLANT
KIT BASIN OR (CUSTOM PROCEDURE TRAY) ×1 IMPLANT
KIT TURNOVER KIT A (KITS) IMPLANT
NDL HYPO 25X1 1.5 SAFETY (NEEDLE) ×1 IMPLANT
NEEDLE HYPO 25X1 1.5 SAFETY (NEEDLE) ×1
PACK ORTHO EXTREMITY (CUSTOM PROCEDURE TRAY) ×1 IMPLANT
PADDING UNDERCAST 2X4 STRL (CAST SUPPLIES) ×1 IMPLANT
PENCIL SMOKE EVACUATOR (MISCELLANEOUS) ×1 IMPLANT
SET HNDPC FAN SPRY TIP SCT (DISPOSABLE) IMPLANT
SPIKE FLUID TRANSFER (MISCELLANEOUS) IMPLANT
STAPLER SKIN PROX WIDE 3.9 (STAPLE) ×1 IMPLANT
SUT ETHILON 4 0 PS 2 18 (SUTURE) ×1 IMPLANT
SUT MNCRL AB 4-0 PS2 18 (SUTURE) ×1 IMPLANT
SYR 20ML LL LF (SYRINGE) IMPLANT
TOWEL OR 17X26 10 PK STRL BLUE (TOWEL DISPOSABLE) ×1 IMPLANT
TOWEL OR NON WOVEN STRL DISP B (DISPOSABLE) ×1 IMPLANT
UNDERPAD 30X36 HEAVY ABSORB (UNDERPADS AND DIAPERS) ×2 IMPLANT
YANKAUER SUCT BULB TIP 10FT TU (MISCELLANEOUS) ×1 IMPLANT

## 2023-01-04 NOTE — Transfer of Care (Signed)
Immediate Anesthesia Transfer of Care Note  Patient: Linda [name]  Procedure(s) Performed: R 1ST TOE AMPUTATION, RIGHT 3RD TOE AMPUTATION (Right: Toe)  Patient Location: PACU  Anesthesia Type:MAC  Level of Consciousness: drowsy  Airway & Oxygen Therapy: Patient Spontanous Breathing and Patient connected to face mask oxygen  Post-op Assessment: Report given to RN and Post -op Vital signs reviewed and stable  Post vital signs: Reviewed and stable  Last Vitals:  Vitals Value Taken Time  BP 130/78 01/04/23 1415  Temp    Pulse 92 01/04/23 1417  Resp 12 01/04/23 1417  SpO2 100 % 01/04/23 1417  Vitals shown include unfiled device data.  Last Pain:  Vitals:   01/04/23 1104  TempSrc:   PainSc: 0-No pain         Complications: No notable events documented.

## 2023-01-04 NOTE — Progress Notes (Signed)
Orthopedic Tech Progress Note Patient Details:  Linda [name] May 13, 1970 [id number]  Ortho Devices Type of Ortho Device: Postop shoe/boot Ortho Device/Splint Location: right Ortho Device/Splint Interventions: Ordered, Application, Adjustment   Post Interventions Patient Tolerated: Well Instructions Provided: Adjustment of device, Care of device  Kizzie Fantasia 01/04/2023, 2:43 PM

## 2023-01-04 NOTE — Op Note (Signed)
Full Operative Report  Date of Operation: 2:16 PM, 01/04/2023   Patient: Linda [name] - 52 y.o. 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 Amputation of remainder of hallux at MPJ level, right foot 2. Amputation of  3rd toe at MPJ level, right foot    Anesthesia: Louie Casa, MD  Anesthesiologist: Bethena Midget, MD CRNA: Jamelle Rushing, CRNA   Estimated Blood Loss: Minimal   Hemostasis: 1) Anatomical dissection, mechanical compression, electrocautery 2) No tourniquet was used during the procedure  Implants: * No implants in log *  Materials: Prolene 3-0 and 4-0   Injectables: 1) Pre-operatively: 20 cc  0.25% marcaine plain 2) Post-operatively: None   Specimens: Pathology: Right 1st and 3rd toe seperately  Microbiology: deep tissue swab 3rd MPJ right foot   Antibiotics: IV antibiotics given per schedule on the floor  Drains: None  Complications: Patient tolerated the procedure well without complication.   Operative findings: As below in detailed report  Indications for Procedure: Linda [name] presents to Pilar Plate, DPM with a chief complaint of chronic ulcer right 3rd toe. Also with concern for residual osteomyelitis of the right hallux proximal phalanx stump site. OM in 3rd toe distally. The patient has failed conservative treatments of various modalities. At this time the patient has elected to proceed with surgical correction. All alternatives, risks, and complications of the procedures were thoroughly explained to the patient. Patient exhibits appropriate understanding of all discussion points and informed consent was signed and obtained in the chart with no guarantees to surgical outcome given or implied.  Description of Procedure: Patient was brought to the operating room. Patient remained on their  hospital bed in the supine position. A surgical timeout was performed and all members of the operating room, the procedure, and the surgical site were identified. anesthesia occurred as per anesthesia record. Local anesthetic as previously described was then injected about the operative field in a local infiltrative block.  The operative lower extremity as noted above was then prepped and draped in the usual sterile manner. The following procedure then began.  Attention was directed to the 1st digit on the right foot - prior partial amputation through the distal portion of the proximal phalanx. A full-thickness incision encompassing the entire digit was made using a #15 blade. Dissection was carried down to bone. The toe was secured with a towel clamp, further dissected in its entirety, and disarticulated at the MPJ and passed to the back table as a gross specimen. This was then labled and sent to pathology.   All remaining necrotic and devitalized soft tissue structures were visualized and dissected away using sharp and dull dissection. Care was taken to protect all neurovascular structures throughout the dissection. All bleeders were cauterized as necessary.   The area was then flushed with copious amounts of sterile saline. Then using the suture materials previously described, the site was closed in anatomic layers and the skin was well approximated under minimal tension.  Attention was directed to the 3rd digit on the right foot. A full-thickness incision encompassing the entire digit was made using a #15 blade. Dissection was carried down to bone. The toe was secured with a towel clamp, further dissected in its entirety, and disarticulated at the MPJ and passed to the back table as a gross specimen. This was then labled and sent to pathology.  All remaining necrotic and devitalized soft tissue structures were visualized and dissected away using sharp and dull dissection. Care was taken to protect all  neurovascular structures throughout the dissection. All bleeders were cauterized as necessary. A deep tissue culture was obtained at this time. The area was then flushed with copious amounts of sterile saline. Then using the suture materials previously described, the site was closed in anatomic layers and the skin was well approximated under minimal tension.  The surgical site was then dressed with xeroform, 4x4 kerlix and ace wrap. The patient tolerated both the procedure and anesthesia well with vital signs stable throughout. The patient was transferred in good condition and all vital signs stable  from the OR to recovery under the discretion of anesthesia.  Condition: Vital signs stable, neurovascular status unchanged from preoperative   Surgical plan:  Clean margins anticipated. WBAT in post op shoe. Leave dressing C/D/I  The patient will be WBAT in a post op shoe to the operative limb until further instructed. The dressing is to remain clean, dry, and intact. Will continue to follow unless noted elsewhere.   Carlena Hurl, DPM [facility]

## 2023-01-04 NOTE — Discharge Instructions (Signed)
Foot & Ankle Surgery Patient Discharge Instructions:   Arrange to have an adult drive you home after surgery. If you had general anesthesia, it may take a day or more to fully recover. So, for at least the next 24 hours: Do not drive or use machinery or power tools; do not drink alcohol; and do not make any major decisions.   Bandages: Keep your dressings clean, dry, and intact. Do not remove or change. When bathing/showering, cover the bandage or cast with a plastic bag to keep it dry (still keep the area away from the water) and use a chair, do not stand. Don't remove your bandage until your doctor tells you to. If your bandage gets wet or dirty, check with your doctor. You can likely replace it with a clean, dry one.  Activity: Weightbearing as tolerated in a post op shoe Sit or lie down when possible. Put a pillow under your heel to raise your foot above the level of your heart. Place ice bag or pack behind knee on surgical side for 20 minutes every hour that you are awake for the first 24-48 hours. You can drive again when instructed by your doctor. Wear your surgical shoe at all times unless told otherwise by your health care provider. Use crutches or a cane as directed. Follow your doctor's instructions about putting weight on your foot.  Diet: Start with liquids and light foods (such as dry toast, bananas, and applesauce). As you feel up to it, slowly return to your normal diet. Drink at least six to eight glasses of water or other nonalcoholic fluids a day. To avoid nausea, eat before taking narcotic pain medications.  Medications: Take all medications as instructed. Take pain medications on time. Do not wait until the pain is bad before taking your medications. Avoid alcohol while on pain medications.  Call your doctor if: Continuous bleeding through dressings. If your dressings get wet. Fevers over 100.4 degrees Fahrenheit (38 degrees Celsius). Chest pains or difficulty  breathing.

## 2023-01-04 NOTE — Anesthesia Postprocedure Evaluation (Signed)
Anesthesia Post Note  Patient: Linda [name]  Procedure(s) Performed: R 1ST TOE AMPUTATION, RIGHT 3RD TOE AMPUTATION (Right: Toe)     Patient location during evaluation: PACU Anesthesia Type: General Level of consciousness: awake and alert Pain management: pain level controlled Vital Signs Assessment: post-procedure vital signs reviewed and stable Respiratory status: spontaneous breathing, nonlabored ventilation, respiratory function stable and patient connected to nasal cannula oxygen Cardiovascular status: blood pressure returned to baseline and stable Postop Assessment: no apparent nausea or vomiting Anesthetic complications: no   No notable events documented.  Last Vitals:  Vitals:   01/04/23 1430 01/04/23 1445  BP: (!) 121/97 (!) 108/52  Pulse: 92 92  Resp: 12 12  Temp:  36.4 C  SpO2: 100% 94%    Last Pain:  Vitals:   01/04/23 1445  TempSrc:   PainSc: 0-No pain                 Gini Caputo

## 2023-01-04 NOTE — H&P (Addendum)
SURGICAL HISTORY and PHYSICAL FOOT & ANKLE SURGERY    Date Time: 01/04/23 1:12 PM Physician: Annamary Rummage   History of Presenting Illness: Linda [name] is a 52 y.o. year old female presenting to [facility] hospital for surgery on her right foot today. The patient complains of chronic ulceration and concern for osteomeylitis of the right 1st and 3rd toe. S/p prior partial hallux amputation.  that has been present for months, but that has progressively worsened during the past few weeks. Conservative management was attempted including local wound care, but was ultimately unsuccessful. The patient has elected to pursue surgical management and understands the procedure, benefits, risks/complications, and alternatives to surgery. She has remained npo since MN. Otherwise feeling well today, no other complaints. Denies fever, chills, nausea, emesis, dyspnea, and chest pain.  Past Medical History: 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 2 diabetes mellitus, with fat layer exposed (HCC) 03/27/2020   Gastritis    Genetic susceptibility to malignant neoplasm of breast    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 No longer has    Past Surgical History: Past Surgical History:  Procedure Laterality Date   AMPUTATION TOE Bilateral 03/04/2022   Procedure: AMPUTATION TOE RIGHT FOOT PARTIAL OR TOTAL GREAT TOE AND SECOND TOE, LEFT FOOT PARTIAL OR TOTAL GREAT TOE, SECOND AND THIRD TOE;  Surgeon: Pilar Plate, DPM;  Location: [facility] OR;  Service: Podiatry;  Laterality: Bilateral;   APPENDECTOMY     BACK SURGERY     x 3 lower disc   BILATERAL TOTAL MASTECTOMY WITH AXILLARY LYMPH NODE DISSECTION Bilateral 09/2019   CHOLECYSTECTOMY     nephrolithiasis     ROBOTIC ASSISTED TOTAL HYSTERECTOMY WITH BILATERAL SALPINGO OOPHERECTOMY Bilateral 06/14/2016   Procedure: ROBOTIC ASSISTED TOTAL HYSTERECTOMY WITH BILATERAL SALPINGO OOPHORECTOMY;  Surgeon: Cleda Mccreedy, MD;  Location: [facility] ORS;  Service: Gynecology;  Laterality: Bilateral;    Allergies: Allergies  Allergen Reactions   Codeine Shortness Of Breath and Other (See Comments)    Other reaction(s): SHOB    Augmentin [Amoxicillin-Pot Clavulanate] Diarrhea   Celebrex [Celecoxib] Other (See Comments)    Unknown reaction   Propranolol Other (See Comments)    Other reaction(s): Unknown   Propranolol Hcl Other (See Comments)    Unknown reaction  ask   Simvastatin Other (See Comments)  Other reaction(s): Unknown   Vytorin [Ezetimibe-Simvastatin] Other (See Comments)    Unknown reaction  Patient is not aware of an allergy to this medication, ask   Zetia [Ezetimibe] Other (See Comments)    Other reaction(s): Unknown    Home Medications: Current Facility-Administered Medications  Medication Dose Route Frequency Provider Last Rate Last Admin   clindamycin (CLEOCIN) IVPB 900 mg  900 mg Intravenous Once Liban Guedes F, DPM       insulin aspart  (novoLOG) injection 0-7 Units  0-7 Units Subcutaneous Q2H PRN Bethena Midget, MD       lactated ringers infusion   Intravenous Continuous Beryle Lathe, MD 10 mL/hr at 01/04/23 1137 Continued from Pre-op at 01/04/23 1137   Facility-Administered Medications Ordered in Other Encounters  Medication Dose Route Frequency Provider Last Rate Last Admin   sodium chloride flush (NS) 0.9 % injection 10 mL  10 mL Intracatheter PRN Mosher, Harvin Hazel A, PA-C   10 mL at 02/19/21 1004   Medications Prior to Admission  Medication Sig Dispense Refill Last Dose   acetaminophen (TYLENOL) 500 MG tablet Take 500 mg by mouth every 6 (six) hours as needed for moderate pain or mild pain.   Past Week   amitriptyline (ELAVIL) 25 MG tablet TAKE ONE TABLET BY MOUTH AT BEDTIME 30 tablet 5 01/03/2023   atorvastatin (LIPITOR) 10 MG tablet TAKE ONE TABLET BY MOUTH EVERY EVENING 90 tablet 0 01/03/2023   buPROPion (WELLBUTRIN XL) 300 MG 24 hr tablet Take 1 tablet (300 mg total) by mouth every morning. 90 tablet 3 01/04/2023 at 0800   busPIRone (BUSPAR) 5 MG tablet Take 1 tablet (5 mg total) by mouth 3 (three) times daily. 90 tablet 5 01/04/2023 at 0800   cyclobenzaprine (FLEXERIL) 10 MG tablet TAKE ONE TABLET BY MOUTH every EIGHT hours AS NEEDED FOR MUSCLE SPASMS 270 tablet 1 Past Week   ferrous sulfate 325 (65 FE) MG tablet Take 325 mg by mouth every evening.   Past Month   gabapentin (NEURONTIN) 400 MG capsule TAKE 2 CAPSULES BY MOUTH 3 TIMES DAILY 180 capsule 1 01/03/2023   Levomilnacipran HCl ER (FETZIMA) 80 MG CP24 Take 1 capsule by mouth every evening. 90 capsule 1 01/03/2023   levothyroxine (SYNTHROID) 75 MCG tablet Take 1 tablet (75 mcg total) by mouth daily before breakfast. 90 tablet 3 01/04/2023 at 0800   losartan (COZAAR) 50 MG tablet TAKE ONE TABLET BY MOUTH EVERY EVENING 90 tablet 1 01/03/2023   Magnesium 500 MG CAPS Take 1 capsule (500 mg total) by mouth daily. 90 capsule 1 Past Month   morphine (MS CONTIN) 30 MG 12  hr tablet Take 1 tablet (30 mg total) by mouth every 12 (twelve) hours. 60 tablet 0    MOUNJARO 7.5 MG/0.5ML Pen INJECT 7.5 MG INTO THE SKIN EVERY WEEK 2 mL 1 12/24/2022   Multiple Vitamin (MULTIVITAMIN WITH MINERALS) TABS tablet Take 1 tablet by mouth daily. Solar ray   12/24/2022   omega-3 acid ethyl esters (LOVAZA) 1 g capsule TAKE TWO CAPSULES BY MOUTH TWICE DAILY 360 capsule 1 01/03/2023   OVER THE COUNTER MEDICATION Take 1 tablet by mouth in the morning and at bedtime. Lutein for eyes      pantoprazole (PROTONIX) 40 MG tablet Take 1 tablet (40 mg total) by mouth 2 (two) times daily. 180 tablet 1 01/04/2023 at 0800   potassium chloride (MICRO-K) 10 MEQ CR capsule Take 2 capsules (20 mEq total) by mouth 2 (two) times daily. 360 capsule  3 01/03/2023   Probiotic Product (PROBIOTIC PO) Take 1 capsule by mouth in the morning. 3.2 billion cfu   12/24/2022   prochlorperazine (COMPAZINE) 5 MG tablet TAKE ONE TABLET BY MOUTH every SIX hours AS NEEDED FOR NAUSEA AND VOMITING 30 tablet 1    ramelteon (ROZEREM) 8 MG tablet Take 1 tablet (8 mg total) by mouth at bedtime. 90 tablet 1 01/03/2023   rOPINIRole (REQUIP) 0.25 MG tablet TAKE ONE TABLET BY MOUTH EVERYDAY AT BEDTIME 90 tablet 1 01/03/2023   SYNJARDY 12.05-998 MG TABS TAKE ONE TABLET BY MOUTH TWICE DAILY 60 tablet 1 Past Week   TRESIBA FLEXTOUCH 200 UNIT/ML FlexTouch Pen INJECT 60 UNITS SUBCUTANEOUSLY DAILY 9 mL 3 01/03/2023   Vitamin D, Ergocalciferol, (DRISDOL) 1.25 MG (50000 UNIT) CAPS capsule TAKE ONE CAPSULE BY MOUTH ONCE WEEKLY ON FRIDAY 12 capsule 1 12/24/2022   VRAYLAR 3 MG capsule TAKE ONE CAPSULE BY MOUTH EVERY DAY 90 capsule 0 01/04/2023 at 0800   VYVANSE 70 MG capsule TAKE ONE CAPSULE BY MOUTH EVERY DAY 30 capsule 0 01/03/2023   albuterol (VENTOLIN HFA) 108 (90 Base) MCG/ACT inhaler INHALE TWO PUFFS BY MOUTH INTO LUNGS every SIX hours orn FOR WHEEZING AND/OR SHORTNESS OF BREATH 8.5 g 1 More than a month   Blood Glucose Monitoring Suppl  (ONETOUCH VERIO REFLECT) w/Device KIT AS DIRECTED      Blood Pressure Monitoring (SPHYGMOMANOMETER) MISC 1 each by Does not apply route daily in the afternoon. 1 each 0    clotrimazole-betamethasone (LOTRISONE) cream Apply twice daily as needed to area of concern 45 g 2 More than a month   Continuous Blood Gluc Receiver (FREESTYLE LIBRE 2 READER) DEVI E11.69 Check blood sugar 4 times daily as directed 1 each 0    Continuous Glucose Sensor (FREESTYLE LIBRE 3 PLUS SENSOR) MISC Change sensor every 15 days. 6 each 1    dicyclomine (BENTYL) 20 MG tablet TAKE ONE TABLET BY MOUTH BEFORE MEALS AND AT BEDTIME AS NEEDED FOR STOMACH CRAMPING 180 tablet 1 More than a month   glucose blood test strip Use as instructed to check FBS daily. E11.40 100 each 12    Insulin Pen Needle (BD PEN NEEDLE NANO U/F) 32G X 4 MM MISC Use new needle with each injection. 30 each 2    Lancets (ONETOUCH DELICA PLUS LANCET30G) MISC USE TO check blood glucose 2-3 times daily AS DIRECTED 100 each 3    metoprolol succinate (TOPROL-XL) 25 MG 24 hr tablet Take 1 tablet (25 mg total) by mouth daily. 90 tablet 0    mupirocin ointment (BACTROBAN) 2 % Apply 1 Application topically daily. 22 g 0 More than a month   ondansetron (ZOFRAN) 4 MG tablet Take 1 tablet (4 mg total) by mouth every 4 (four) hours as needed for nausea. 90 tablet 3 More than a month   polyethylene glycol (MIRALAX / GLYCOLAX) 17 g packet Take 17 g by mouth daily as needed for moderate constipation.   More than a month     Social History: @IPSOCHX @  Family History: Family History  Problem Relation Age of Onset   Cancer Mother        breast   CAD Father    Diabetes Father    Heart failure Father    Cancer Father        renal carcinoma   Kidney failure Father    Cancer Brother 50       renal carcinoma.   Stroke Paternal Grandmother  Review of Systems: As per HPI. A complete 10 point review of systems was performed and all other systems reviewed were  negative.  Physical Exam: Vitals:   01/04/23 1050  BP: (!) 118/58  Pulse: (!) 102  Resp: 20  Temp: 98.6 F (37 C)  SpO2: 93%    Lower Extremity Physical Exam  Vasc: Dp and PT pulses palpabled Neuro: Sensation absent to forefoot to light touch MSK: S/p prior R partial hallux amp and 2nd toe amp at MPJ level Derm: Ulcer plantar R hallux stub and distal 3rd toe with erythema and edema      Labs:  - Reviewed prior to examination  Radiology:  - Reviewed prior to examination  Assessment: Italya Kreuz is 52 y.o. female with osteomyelitis of the right 1st and 3rd toes   Plan: - Will proceed with surgical management revision right hallux amputation and right 3rd toe amputation. - Procedure, benefits, risks, and alternatives discussed with patient and informed consent obtained. - Prophylactic antibiotics: clindamycin 900 mg - Surgical site marked.  Jenelle Mages Cynthia Cogle

## 2023-01-04 NOTE — Anesthesia Preprocedure Evaluation (Addendum)
Anesthesia Evaluation  Patient identified by MRN, date of birth, ID band Patient awake    Reviewed: Allergy & Precautions, NPO status , Patient's Chart, lab work & pertinent test results  History of Anesthesia Complications (+) history of anesthetic complications  Airway Mallampati: II  TM Distance: >3 FB Neck ROM: Full    Dental no notable dental hx. (+) Edentulous Upper, Dental Advisory Given, Poor Dentition, Chipped, Missing,    Pulmonary sleep apnea and Continuous Positive Airway Pressure Ventilation , pneumonia   Pulmonary exam normal breath sounds clear to auscultation- rhonchi       Cardiovascular hypertension, Pt. on medications Normal cardiovascular exam Rhythm:Regular Rate:Normal     Neuro/Psych  PSYCHIATRIC DISORDERS Anxiety Depression     Neuromuscular disease negative neurological ROS     GI/Hepatic ,GERD  Medicated,,(+) Hepatitis -  Endo/Other  diabetes, Type 2Hypothyroidism    Renal/GU Renal disease     Musculoskeletal  (+) Arthritis , Osteoarthritis,    Abdominal  (+) + obese  Peds  Hematology  (+) Blood dyscrasia, anemia   Anesthesia Other Findings   Reproductive/Obstetrics negative OB ROS                             Lab Results  Component Value Date   WBC 6.4 01/02/2023   HGB 12.8 01/02/2023   HCT 42.8 01/02/2023   MCV 85 01/02/2023   PLT 86 (LL) 01/02/2023   Lab Results  Component Value Date   CREATININE 1.00 12/21/2022   BUN 17 12/21/2022   NA 140 12/21/2022   K 4.0 12/21/2022   CL 105 12/21/2022   CO2 25 12/21/2022   Lab Results  Component Value Date   INR 1.1 12/20/2022   INR 1.0 03/01/2022    EKG: normal sinus rhythm.   Anesthesia Physical Anesthesia Plan  ASA: 3  Anesthesia Plan: MAC   Post-op Pain Management: Minimal or no pain anticipated   Induction: Intravenous  PONV Risk Score and Plan: 4 or greater and Ondansetron, Treatment may  vary due to age or medical condition, Propofol infusion and Midazolam  Airway Management Planned: Natural Airway and Simple Face Mask  Additional Equipment: None  Intra-op Plan:   Post-operative Plan: Extubation in OR  Informed Consent: I have reviewed the patients History and Physical, chart, labs and discussed the procedure including the risks, benefits and alternatives for the proposed anesthesia with the patient or authorized representative who has indicated his/her understanding and acceptance.     Dental advisory given  Plan Discussed with: CRNA and Anesthesiologist  Anesthesia Plan Comments: (PAT note written 03/03/2022 by Shonna Chock, PA-C. She has medical clearance. Known chronic thrombocytopenia.    DISCUSSION: Patient is a 52 year old female scheduled for the above procedure.   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 )        Anesthesia Quick Evaluation

## 2023-01-05 ENCOUNTER — Telehealth: Payer: Self-pay

## 2023-01-05 ENCOUNTER — Other Ambulatory Visit: Payer: Self-pay | Admitting: Podiatry

## 2023-01-05 ENCOUNTER — Other Ambulatory Visit: Payer: Self-pay

## 2023-01-05 ENCOUNTER — Encounter (HOSPITAL_COMMUNITY): Payer: Self-pay

## 2023-01-05 DIAGNOSIS — Z89421 Acquired absence of other right toe(s): Secondary | ICD-10-CM

## 2023-01-05 NOTE — ED Notes (Signed)
Put wound seal at site and ace bandage.

## 2023-01-05 NOTE — Progress Notes (Signed)
Home health order sent

## 2023-01-05 NOTE — ED Provider Notes (Signed)
[place] [facility] AT [facility] Provider Note   CSN: [id number] Arrival date & time: 01/04/23  2334     History  Chief Complaint  Patient presents with   Post-op Problem    Linda [name] is a 52 y.o. female.  Presents to the emergency department for evaluation of postoperative bleeding.  Patient had 2 toes amputated earlier today.  Since surgery she has had continuous oozing of blood.  She will that she was seen at [facility] for this this afternoon.  Since she was discharged from the hospital (no interventions were performed) she has had continuous bleeding.       Home Medications Prior to Admission medications   Medication Sig Start Date End Date Taking? Authorizing Provider  acetaminophen (TYLENOL) 500 MG tablet Take 500 mg by mouth every 6 (six) hours as needed for moderate pain or mild pain.    [provider]  albuterol (VENTOLIN HFA) 108 (90 Base) MCG/ACT inhaler INHALE TWO PUFFS BY MOUTH INTO LUNGS every SIX hours orn FOR WHEEZING AND/OR SHORTNESS OF BREATH 09/19/22   [name], [name], MD  amitriptyline (ELAVIL) 25 MG tablet TAKE ONE TABLET BY MOUTH AT BEDTIME 10/13/22   [name], Kirsten, MD  atorvastatin (LIPITOR) 10 MG tablet TAKE ONE TABLET BY MOUTH EVERY EVENING 11/27/22   [name], Kirsten, MD  Blood Glucose Monitoring Suppl (ONETOUCH VERIO REFLECT) w/Device KIT AS DIRECTED 01/30/19   [provider]  Blood Pressure Monitoring (SPHYGMOMANOMETER) MISC 1 each by Does not apply route daily in the afternoon. 12/23/21   [name], MD  buPROPion (WELLBUTRIN XL) 300 MG 24 hr tablet Take 1 tablet (300 mg total) by mouth every morning. 09/14/22   [name], Kirsten, MD  busPIRone (BUSPAR) 5 MG tablet Take 1 tablet (5 mg total) by mouth 3 (three) times daily. 09/14/22   [name], [name], MD  clindamycin (CLEOCIN) 300 MG capsule Take 1 capsule (300 mg total) by mouth 3 (three) times daily for 7 days. 01/04/23 01/11/23  Standiford, Jenelle Mages, DPM   clotrimazole-betamethasone (LOTRISONE) cream Apply twice daily as needed to area of concern 03/18/21   [name], [name], MD  Continuous Blood Gluc Receiver (FREESTYLE LIBRE 2 READER) DEVI E11.69 Check blood sugar 4 times daily as directed 03/05/20   [name], [name], MD  Continuous Glucose Sensor (FREESTYLE LIBRE 3 PLUS SENSOR) MISC Change sensor every 15 days. 01/02/23   [name], [name], MD  cyclobenzaprine (FLEXERIL) 10 MG tablet TAKE ONE TABLET BY MOUTH every EIGHT hours AS NEEDED FOR MUSCLE SPASMS 09/14/22   [name], Kirsten, MD  dicyclomine (BENTYL) 20 MG tablet TAKE ONE TABLET BY MOUTH BEFORE MEALS AND AT BEDTIME AS NEEDED FOR STOMACH CRAMPING 10/13/22   [name], Kirsten, MD  ferrous sulfate 325 (65 FE) MG tablet Take 325 mg by mouth every evening.    [provider]  gabapentin (NEURONTIN) 400 MG capsule TAKE 2 CAPSULES BY MOUTH 3 TIMES DAILY 12/27/22   Sirivol, Kristie Cowman, MD  glucose blood test strip Use as instructed to check FBS daily. E11.40 12/26/22   CoxFritzi Mandes, MD  Insulin Pen Needle (BD PEN NEEDLE NANO U/F) 32G X 4 MM MISC Use new needle with each injection. 09/16/22   [name], [name], MD  Lancets [facility] DELICA PLUS Janene Madeira) MISC USE TO check blood glucose 2-3 times daily AS DIRECTED 12/26/22   CoxFritzi Mandes, MD  Levomilnacipran HCl ER (FETZIMA) 80 MG CP24 Take 1 capsule by mouth every evening. 09/14/22   [name], MD  levothyroxine (SYNTHROID) 75  MCG tablet Take 1 tablet (75 mcg total) by mouth daily before breakfast. 09/14/22   [name], Kirsten, MD  losartan (COZAAR) 50 MG tablet TAKE ONE TABLET BY MOUTH EVERY EVENING 10/13/22   [name], [name], MD  Magnesium 500 MG CAPS Take 1 capsule (500 mg total) by mouth daily. 09/14/22   CoxFritzi Mandes, MD  metoprolol succinate (TOPROL-XL) 25 MG 24 hr tablet Take 1 tablet (25 mg total) by mouth daily. 01/02/23   [name], [name], MD  morphine (MS CONTIN) 30 MG 12 hr tablet Take 1 tablet (30 mg total) by mouth every 12 (twelve) hours. 12/12/22 01/11/23  Windell Moment, MD   MOUNJARO 7.5 MG/0.5ML Pen INJECT 7.5 MG INTO THE SKIN EVERY WEEK 11/25/22   [name], [name], MD  Multiple Vitamin (MULTIVITAMIN WITH MINERALS) TABS tablet Take 1 tablet by mouth daily. Solar ray    [provider]  mupirocin ointment (BACTROBAN) 2 % Apply 1 Application topically daily. 11/23/22   Standiford, Jenelle Mages, DPM  omega-3 acid ethyl esters (LOVAZA) 1 g capsule TAKE TWO CAPSULES BY MOUTH TWICE DAILY 09/19/22   [name], Kirsten, MD  ondansetron (ZOFRAN) 4 MG tablet Take 1 tablet (4 mg total) by mouth every 4 (four) hours as needed for nausea. 09/14/22   CoxFritzi Mandes, MD  OVER THE COUNTER MEDICATION Take 1 tablet by mouth in the morning and at bedtime. Lutein for eyes    [provider]  pantoprazole (PROTONIX) 40 MG tablet Take 1 tablet (40 mg total) by mouth 2 (two) times daily. 09/14/22   [name], [name], MD  polyethylene glycol (MIRALAX / GLYCOLAX) 17 g packet Take 17 g by mouth daily as needed for moderate constipation. 12/23/15   [provider]  potassium chloride (MICRO-K) 10 MEQ CR capsule Take 2 capsules (20 mEq total) by mouth 2 (two) times daily. 09/14/22   CoxFritzi Mandes, MD  Probiotic Product (PROBIOTIC PO) Take 1 capsule by mouth in the morning. 3.2 billion cfu    [provider]  prochlorperazine (COMPAZINE) 5 MG tablet TAKE ONE TABLET BY MOUTH every SIX hours AS NEEDED FOR NAUSEA AND VOMITING 09/19/22   [name], Kirsten, MD  ramelteon (ROZEREM) 8 MG tablet Take 1 tablet (8 mg total) by mouth at bedtime. 09/14/22   [name], [name], MD  rOPINIRole (REQUIP) 0.25 MG tablet TAKE ONE TABLET BY MOUTH EVERYDAY AT BEDTIME 09/19/22   [name], [name], MD  SYNJARDY 12.05-998 MG TABS TAKE ONE TABLET BY MOUTH TWICE DAILY 12/27/22   Sirivol, Kristie Cowman, MD  TRESIBA FLEXTOUCH 200 UNIT/ML FlexTouch Pen INJECT 60 UNITS SUBCUTANEOUSLY DAILY 12/27/22   [name], Kirsten, MD  Vitamin D, Ergocalciferol, (DRISDOL) 1.25 MG (50000 UNIT) CAPS capsule TAKE ONE CAPSULE BY MOUTH ONCE WEEKLY ON FRIDAY  09/14/22   [name], MD  VRAYLAR 3 MG capsule TAKE ONE CAPSULE BY MOUTH EVERY DAY 12/12/22   Windell Moment, MD  VYVANSE 70 MG capsule TAKE ONE CAPSULE BY MOUTH EVERY DAY 12/12/22   Windell Moment, MD      Allergies    Codeine, Augmentin [amoxicillin-pot clavulanate], Celebrex [celecoxib], Propranolol, Propranolol hcl, Simvastatin, Vytorin [ezetimibe-simvastatin], and Zetia [ezetimibe]    Review of Systems   Review of Systems  Physical Exam Updated Vital Signs BP (!) 144/74 (BP Location: Right Arm)   Pulse 92   Temp 98.9 F (37.2 C) (Oral)   Resp 14   Ht 5\' 2"  (1.575 m)   Wt 93 kg   LMP 06/13/2009   SpO2 97%   BMI 37.49 kg/m  Physical  Exam Vitals and nursing note reviewed.  Constitutional:      General: She is not in acute distress.    Appearance: She is well-developed.  HENT:     Head: Normocephalic and atraumatic.     Mouth/Throat:     Mouth: Mucous membranes are moist.  Eyes:     General: Vision grossly intact. Gaze aligned appropriately.     Extraocular Movements: Extraocular movements intact.     Conjunctiva/sclera: Conjunctivae normal.  Cardiovascular:     Rate and Rhythm: Normal rate and regular rhythm.     Pulses: Normal pulses.     Heart sounds: Normal heart sounds, S1 normal and S2 normal. No murmur heard.    No friction rub. No gallop.  Pulmonary:     Effort: Pulmonary effort is normal. No respiratory distress.     Breath sounds: Normal breath sounds.  Abdominal:     General: Bowel sounds are normal.     Palpations: Abdomen is soft.     Tenderness: There is no abdominal tenderness. There is no guarding or rebound.     Hernia: No hernia is present.  Musculoskeletal:        General: No swelling.     Cervical back: Full passive range of motion without pain, normal range of motion and neck supple. No spinous process tenderness or muscular tenderness. Normal range of motion.     Right lower leg: No edema.     Left lower leg: No edema.     Comments:  Amputations of right great and third toe -there is a fair amount of exposed surgical bed where there likely was some continued venous bleeding earlier, no active bleeding currently  Skin:    General: Skin is warm and dry.     Capillary Refill: Capillary refill takes less than 2 seconds.     Findings: No ecchymosis, erythema, rash or wound.     Comments: Surgical wound noted right foot  Neurological:     General: No focal deficit present.     Mental Status: She is alert and oriented to person, place, and time.     GCS: GCS eye subscore is 4. GCS verbal subscore is 5. GCS motor subscore is 6.     Cranial Nerves: Cranial nerves 2-12 are intact.     Sensory: Sensation is intact.     Motor: Motor function is intact.     Coordination: Coordination is intact.  Psychiatric:        Attention and Perception: Attention normal.        Mood and Affect: Mood normal.        Speech: Speech normal.        Behavior: Behavior normal.     ED Results / Procedures / Treatments   Labs (all labs ordered are listed, but only abnormal results are displayed) Labs Reviewed - No data to display  EKG None  Radiology DG Foot 2 Views Right Result Date: 01/04/2023 CLINICAL DATA:  Postop.  Amputation EXAM: RIGHT FOOT - 2 VIEW COMPARISON:  Preop x-ray 11/08/2022. FINDINGS: There has been amputation of the phalanges of the great toe in the phalanges of third digit since the prior. There is previous amputation of the phalanges of second digit. Soft tissue swelling identified. No acute fracture or dislocation. Otherwise there is osteopenia. Degenerative changes of the midfoot. Well corticated plantar calcaneal spur. Evaluation the midfoot is limited with the oblique views. Imaging was obtained to aid in treatment. IMPRESSION: Interval amputation of the phalanges  of first and third digits. Previous amputation of second phalanges. Soft tissue swelling. Overlapping bandages. Electronically Signed   By: Karen Kays M.D.    On: 01/04/2023 18:29    Procedures Procedures    Medications Ordered in ED Medications - No data to display  ED Course/ Medical Decision Making/ A&P                                 Medical Decision Making  Presents with postoperative bleeding.  Sutures are intact.  No drainage or signs of infection.  No active bleeding currently.  There is some dried blood and clot adherent to areas of the surgical incision.  This appears to be in areas where he skin was cut at a [place] and there is some exposed edge of the wound.  She has been monitored for 2 hours without further bleeding.  Wound rewrapped and she was instructed on how to try and stop bleeding on her own at home if it reoccurs.  She has follow-up in 5 days.  She can return to the ER for bleeding that does not stop with direct pressure.        Final Clinical Impression(s) / ED Diagnoses Final diagnoses:  Hemorrhage from wound    Rx / DC Orders ED Discharge Orders     None         Aminah Zabawa, Canary Brim, MD 01/05/23 [phone]

## 2023-01-05 NOTE — Telephone Encounter (Signed)
Patient ended up in the ED at [facility] last night for bleeding issue - they changed her bandage and sent her home. Now she is seeing blood again on the bottom of her bandage. She is not sure what to do. Also she is asking for [facility] - she lives by herself and is having a very hard time taking care of herself after surgery

## 2023-01-05 NOTE — ED Triage Notes (Signed)
Pt is 12 hours post-op from right great toe and right 3rd toe amputation by Dr Annamary Rummage that was performed here. Pt has had steady bleeding from sites and has had to change her dressing x 2. Pt has ace wrap on right foot that is saturated with fresh bright red blood.

## 2023-01-06 ENCOUNTER — Ambulatory Visit: Payer: PPO | Admitting: Family Medicine

## 2023-01-06 DIAGNOSIS — R000 Tachycardia, unspecified: Secondary | ICD-10-CM | POA: Insufficient documentation

## 2023-01-06 LAB — SURGICAL PATHOLOGY

## 2023-01-06 NOTE — Assessment & Plan Note (Signed)
At goal.  A1c well controlled at 6.7. -Consider upgrading to [facility] 3 Plus sensor for continuous A1c monitoring, pending insurance approval.

## 2023-01-06 NOTE — Assessment & Plan Note (Signed)
Scheduled for amputation of the third toe on the right foot due to suspected osteomyelitis. Antibiotics were supposed to be prescribed but not received. -Contact Dr. Annamary Rummage to clarify antibiotic prescription. -Check thyroid levels and blood count.

## 2023-01-06 NOTE — Assessment & Plan Note (Signed)
EKG normal. Check labs. Cleared for surgery. -Continue current medications as prescribed. -Follow-up after surgery to assess recovery and manage medications.

## 2023-01-06 NOTE — Assessment & Plan Note (Signed)
Increased heart rate reported, possibly due to anxiety related to upcoming surgery. No chest pain or breathing problems reported. However with history of elevated heart rate, I would start on metoprolol xl 25 mg daily. . -Order Holter monitor to assess heart rate over an extended period.

## 2023-01-07 ENCOUNTER — Emergency Department (HOSPITAL_COMMUNITY)
Admission: EM | Admit: 2023-01-07 | Discharge: 2023-01-07 | Disposition: A | Discharge status: Home / Self Care | Payer: PPO

## 2023-01-07 DIAGNOSIS — D696 Thrombocytopenia, unspecified: Secondary | ICD-10-CM | POA: Insufficient documentation

## 2023-01-07 DIAGNOSIS — T8789 Other complications of amputation stump: Secondary | ICD-10-CM | POA: Insufficient documentation

## 2023-01-07 DIAGNOSIS — Y829 Unspecified medical devices associated with adverse incidents: Secondary | ICD-10-CM | POA: Insufficient documentation

## 2023-01-07 DIAGNOSIS — Z743 Need for continuous supervision: Secondary | ICD-10-CM | POA: Diagnosis not present

## 2023-01-07 DIAGNOSIS — E119 Type 2 diabetes mellitus without complications: Secondary | ICD-10-CM | POA: Diagnosis not present

## 2023-01-07 DIAGNOSIS — Z89421 Acquired absence of other right toe(s): Secondary | ICD-10-CM | POA: Diagnosis not present

## 2023-01-07 DIAGNOSIS — Z794 Long term (current) use of insulin: Secondary | ICD-10-CM | POA: Insufficient documentation

## 2023-01-07 DIAGNOSIS — T148XXA Other injury of unspecified body region, initial encounter: Secondary | ICD-10-CM

## 2023-01-07 DIAGNOSIS — Z89411 Acquired absence of right great toe: Secondary | ICD-10-CM | POA: Diagnosis not present

## 2023-01-07 DIAGNOSIS — R58 Hemorrhage, not elsewhere classified: Secondary | ICD-10-CM | POA: Diagnosis not present

## 2023-01-07 LAB — CBC WITH DIFFERENTIAL/PLATELET
Abs Immature Granulocytes: 0.01 10*3/uL (ref 0.00–0.07)
Basophils Absolute: 0 10*3/uL (ref 0.0–0.1)
Basophils Relative: 1 %
Eosinophils Absolute: 0.1 10*3/uL (ref 0.0–0.5)
Eosinophils Relative: 3 %
HCT: 36.5 % (ref 36.0–46.0)
Hemoglobin: 11.1 g/dL — ABNORMAL LOW (ref 12.0–15.0)
Immature Granulocytes: 0 %
Lymphocytes Relative: 31 %
Lymphs Abs: 1.2 10*3/uL (ref 0.7–4.0)
MCH: 26.5 pg (ref 26.0–34.0)
MCHC: 30.4 g/dL (ref 30.0–36.0)
MCV: 87.1 fL (ref 80.0–100.0)
Monocytes Absolute: 0.3 10*3/uL (ref 0.1–1.0)
Monocytes Relative: 6 %
Neutro Abs: 2.3 10*3/uL (ref 1.7–7.7)
Neutrophils Relative %: 59 %
Platelets: 65 10*3/uL — ABNORMAL LOW (ref 150–400)
RBC: 4.19 MIL/uL (ref 3.87–5.11)
RDW: 14.2 % (ref 11.5–15.5)
WBC: 3.9 10*3/uL — ABNORMAL LOW (ref 4.0–10.5)
nRBC: 0 % (ref 0.0–0.2)

## 2023-01-07 NOTE — Discharge Instructions (Signed)
As we discussed I recommend that you follow-up with your doctors for wound check sometime next week, your platelets were low in the emergency department today but not significantly lower than your normal baseline.  You are stable to follow-up, you can defer changing the bandage until Monday after we rewrap it today.

## 2023-01-07 NOTE — ED Notes (Signed)
Sterile dressing applied to post surgical site or RLE

## 2023-01-07 NOTE — ED Notes (Signed)
[facility] has been called for transport back to residence.

## 2023-01-07 NOTE — ED Triage Notes (Signed)
Patient reports having right 1st and 3rd toe amputations on Wednesday  Patient says the sites have been bleeding since.  Patient says she came in Wednesday night and was told to put pressure on it. Left with it uncovered Patient says it didn't bleed for a few hours but when got up to go to restroom it was bleeding Bleeding is intermittent Says last time bleeding heavily was 5 am today  Pain rated 5/10

## 2023-01-07 NOTE — ED Provider Notes (Signed)
[facility] Provider Note   CSN: [id number] Arrival date & time: 01/07/23  1144     History  No chief complaint on file.   Linda [name] is a 52 y.o. female with past medical history significant for obesity, hyperlipidemia, opioid dependence, diabetes, who presents with concern for right first and third toe amputations on Wednesday, reports that she has had intermittent bleeding of the toe since then.  Patient reports that she does not have home health and so it has been difficult to change the bandages on her own.  Patient reports that the wounds bled through the bandages twice this morning.  Patient reports that she has a history of low platelets.  HPI     Home Medications Prior to Admission medications   Medication Sig Start Date End Date Taking? Authorizing Provider  acetaminophen (TYLENOL) 500 MG tablet Take 500 mg by mouth every 6 (six) hours as needed for moderate pain or mild pain.    [provider]  albuterol (VENTOLIN HFA) 108 (90 Base) MCG/ACT inhaler INHALE TWO PUFFS BY MOUTH INTO LUNGS every SIX hours orn FOR WHEEZING AND/OR SHORTNESS OF BREATH 09/19/22   [name], [name], MD  amitriptyline (ELAVIL) 25 MG tablet TAKE ONE TABLET BY MOUTH AT BEDTIME 10/13/22   [name], Kirsten, MD  atorvastatin (LIPITOR) 10 MG tablet TAKE ONE TABLET BY MOUTH EVERY EVENING 11/27/22   [name], Kirsten, MD  Blood Glucose Monitoring Suppl (ONETOUCH VERIO REFLECT) w/Device KIT AS DIRECTED 01/30/19   [provider]  Blood Pressure Monitoring (SPHYGMOMANOMETER) MISC 1 each by Does not apply route daily in the afternoon. 12/23/21   [name], MD  buPROPion (WELLBUTRIN XL) 300 MG 24 hr tablet Take 1 tablet (300 mg total) by mouth every morning. 09/14/22   [name], Kirsten, MD  busPIRone (BUSPAR) 5 MG tablet Take 1 tablet (5 mg total) by mouth 3 (three) times daily. 09/14/22   [name], [name], MD  clindamycin (CLEOCIN) 300 MG capsule Take 1 capsule  (300 mg total) by mouth 3 (three) times daily for 7 days. 01/04/23 01/11/23  Standiford, Jenelle Mages, DPM  clotrimazole-betamethasone (LOTRISONE) cream Apply twice daily as needed to area of concern 03/18/21   [name], [name], MD  Continuous Blood Gluc Receiver (FREESTYLE LIBRE 2 READER) DEVI E11.69 Check blood sugar 4 times daily as directed 03/05/20   [name], [name], MD  Continuous Glucose Sensor (FREESTYLE LIBRE 3 PLUS SENSOR) MISC Change sensor every 15 days. 01/02/23   [name], [name], MD  cyclobenzaprine (FLEXERIL) 10 MG tablet TAKE ONE TABLET BY MOUTH every EIGHT hours AS NEEDED FOR MUSCLE SPASMS 09/14/22   [name], Kirsten, MD  dicyclomine (BENTYL) 20 MG tablet TAKE ONE TABLET BY MOUTH BEFORE MEALS AND AT BEDTIME AS NEEDED FOR STOMACH CRAMPING 10/13/22   [name], Kirsten, MD  ferrous sulfate 325 (65 FE) MG tablet Take 325 mg by mouth every evening.    [provider]  gabapentin (NEURONTIN) 400 MG capsule TAKE 2 CAPSULES BY MOUTH 3 TIMES DAILY 12/27/22   Sirivol, Kristie Cowman, MD  glucose blood test strip Use as instructed to check FBS daily. E11.40 12/26/22   CoxFritzi Mandes, MD  Insulin Pen Needle (BD PEN NEEDLE NANO U/F) 32G X 4 MM MISC Use new needle with each injection. 09/16/22   [name], [name], MD  Lancets [facility] DELICA PLUS Janene Madeira) MISC USE TO check blood glucose 2-3 times daily AS DIRECTED 12/26/22   [name], [name], MD  Levomilnacipran HCl ER (FETZIMA) 80 MG CP24  Take 1 capsule by mouth every evening. 09/14/22   [name], MD  levothyroxine (SYNTHROID) 75 MCG tablet Take 1 tablet (75 mcg total) by mouth daily before breakfast. 09/14/22   [name], Kirsten, MD  losartan (COZAAR) 50 MG tablet TAKE ONE TABLET BY MOUTH EVERY EVENING 10/13/22   CoxFritzi Mandes, MD  Magnesium 500 MG CAPS Take 1 capsule (500 mg total) by mouth daily. 09/14/22   CoxFritzi Mandes, MD  metoprolol succinate (TOPROL-XL) 25 MG 24 hr tablet Take 1 tablet (25 mg total) by mouth daily. 01/02/23   [name], [name], MD  morphine (MS CONTIN) 30 MG 12 hr  tablet Take 1 tablet (30 mg total) by mouth every 12 (twelve) hours. 12/12/22 01/11/23  Windell Moment, MD  MOUNJARO 7.5 MG/0.5ML Pen INJECT 7.5 MG INTO THE SKIN EVERY WEEK 11/25/22   [name], [name], MD  Multiple Vitamin (MULTIVITAMIN WITH MINERALS) TABS tablet Take 1 tablet by mouth daily. Solar ray    [provider]  mupirocin ointment (BACTROBAN) 2 % Apply 1 Application topically daily. 11/23/22   Standiford, Jenelle Mages, DPM  omega-3 acid ethyl esters (LOVAZA) 1 g capsule TAKE TWO CAPSULES BY MOUTH TWICE DAILY 09/19/22   [name], Kirsten, MD  ondansetron (ZOFRAN) 4 MG tablet Take 1 tablet (4 mg total) by mouth every 4 (four) hours as needed for nausea. 09/14/22   CoxFritzi Mandes, MD  OVER THE COUNTER MEDICATION Take 1 tablet by mouth in the morning and at bedtime. Lutein for eyes    [provider]  pantoprazole (PROTONIX) 40 MG tablet Take 1 tablet (40 mg total) by mouth 2 (two) times daily. 09/14/22   [name], [name], MD  polyethylene glycol (MIRALAX / GLYCOLAX) 17 g packet Take 17 g by mouth daily as needed for moderate constipation. 12/23/15   [provider]  potassium chloride (MICRO-K) 10 MEQ CR capsule Take 2 capsules (20 mEq total) by mouth 2 (two) times daily. 09/14/22   CoxFritzi Mandes, MD  Probiotic Product (PROBIOTIC PO) Take 1 capsule by mouth in the morning. 3.2 billion cfu    [provider]  prochlorperazine (COMPAZINE) 5 MG tablet TAKE ONE TABLET BY MOUTH every SIX hours AS NEEDED FOR NAUSEA AND VOMITING 09/19/22   [name], Kirsten, MD  ramelteon (ROZEREM) 8 MG tablet Take 1 tablet (8 mg total) by mouth at bedtime. 09/14/22   [name], [name], MD  rOPINIRole (REQUIP) 0.25 MG tablet TAKE ONE TABLET BY MOUTH EVERYDAY AT BEDTIME 09/19/22   [name], Kirsten, MD  SYNJARDY 12.05-998 MG TABS TAKE ONE TABLET BY MOUTH TWICE DAILY 12/27/22   Sirivol, Kristie Cowman, MD  TRESIBA FLEXTOUCH 200 UNIT/ML FlexTouch Pen INJECT 60 UNITS SUBCUTANEOUSLY DAILY 12/27/22   [name], Kirsten, MD  Vitamin D,  Ergocalciferol, (DRISDOL) 1.25 MG (50000 UNIT) CAPS capsule TAKE ONE CAPSULE BY MOUTH ONCE WEEKLY ON FRIDAY 09/14/22   [name], MD  VRAYLAR 3 MG capsule TAKE ONE CAPSULE BY MOUTH EVERY DAY 12/12/22   Windell Moment, MD  VYVANSE 70 MG capsule TAKE ONE CAPSULE BY MOUTH EVERY DAY 12/12/22   Windell Moment, MD      Allergies    Codeine, Augmentin [amoxicillin-pot clavulanate], Celebrex [celecoxib], Propranolol, Propranolol hcl, Simvastatin, Vytorin [ezetimibe-simvastatin], and Zetia [ezetimibe]    Review of Systems   Review of Systems  All other systems reviewed and are negative.   Physical Exam Updated Vital Signs BP 135/82 (BP Location: Left Arm)   Pulse (!) 103   Temp 98.8 F (37.1 C) (Oral)   Resp 16  Ht 5\' 2"  (1.575 m)   Wt 93.4 kg   LMP 06/13/2009   SpO2 95%   BMI 37.68 kg/m  Physical Exam Vitals and nursing note reviewed.  Constitutional:      General: She is not in acute distress.    Appearance: Normal appearance.  HENT:     Head: Normocephalic and atraumatic.  Eyes:     General:        Right eye: No discharge.        Left eye: No discharge.  Cardiovascular:     Rate and Rhythm: Normal rate and regular rhythm.     Heart sounds: No murmur heard.    No friction rub. No gallop.  Pulmonary:     Effort: Pulmonary effort is normal.     Breath sounds: Normal breath sounds.  Abdominal:     General: Bowel sounds are normal.     Palpations: Abdomen is soft.  Skin:    General: Skin is warm and dry.     Capillary Refill: Capillary refill takes less than 2 seconds.     Comments: Postoperative changes of the right foot with recent amputation of the first and third finger.  There is some exposed wound bed, there is no active bleeding at time of my evaluation.  Please see photo in patient's chart.  Neurological:     Mental Status: She is alert and oriented to person, place, and time.  Psychiatric:        Mood and Affect: Mood normal.        Behavior: Behavior  normal.     ED Results / Procedures / Treatments   Labs (all labs ordered are listed, but only abnormal results are displayed) Labs Reviewed  CBC WITH DIFFERENTIAL/PLATELET - Abnormal; Notable for the following components:      Result Value   WBC 3.9 (*)    Hemoglobin 11.1 (*)    Platelets 65 (*)    All other components within normal limits    EKG None  Radiology No results found.  Procedures Procedures    Medications Ordered in ED Medications - No data to display  ED Course/ Medical Decision Making/ A&P                                 Medical Decision Making  This patient is a 52 y.o. female  who presents to the ED for concern of postop bleeding after toe amputation.   Differential diagnoses prior to evaluation: The emergent differential diagnosis includes, but is not limited to, platelet dysfunction, other bleeding disorder, blood thinner use, injury to recent surgery site, versus other. This is not an exhaustive differential.   Past Medical History / Co-morbidities / Social History: obesity, hyperlipidemia, opioid dependence, diabetes  Additional history: Chart reviewed. Pertinent results include: reviewed labwork, imaging from recent ED visits for post op bleeding, recent post op note  Physical Exam: Physical exam performed. The pertinent findings include:  Postoperative changes of the right foot with recent amputation of the first and third finger.  There is some exposed wound bed, there is no active bleeding at time of my evaluation.  Please see photo in patient's chart.   Lab Tests/Imaging studies: I personally interpreted labs/imaging and the pertinent results include: Platelets are low at 65, but are fairly stable compared to patient's recent baseline, I do think that is contributing to her ongoing postop bleeding, but as she is  with control of bleeding in the ED today I do not think that she qualifies for platelet transfusion, I will order home health for  [place] with bandage changes at home, otherwise encouraged her to follow-up with her surgeon and primary care doctor.   Disposition: After consideration of the diagnostic results and the patients response to treatment, I feel that patient is stable for discharge with plans.   emergency department workup does not suggest an emergent condition requiring admission or immediate intervention beyond what has been performed at this time. The plan is: as above. The patient is safe for discharge and has been instructed to return immediately for worsening symptoms, change in symptoms or any other concerns.  Final Clinical Impression(s) / ED Diagnoses Final diagnoses:  Bleeding from wound  Thrombocytopenia [facility])    Rx / DC Orders ED Discharge Orders     None         [place] 01/07/23 1345    Durwin Glaze, MD 01/07/23 1456

## 2023-01-08 ENCOUNTER — Encounter: Payer: Self-pay | Admitting: Family Medicine

## 2023-01-08 ENCOUNTER — Ambulatory Visit: Payer: PPO | Attending: Family Medicine

## 2023-01-08 DIAGNOSIS — M86271 Subacute osteomyelitis, right ankle and foot: Secondary | ICD-10-CM | POA: Insufficient documentation

## 2023-01-08 DIAGNOSIS — R000 Tachycardia, unspecified: Secondary | ICD-10-CM

## 2023-01-08 NOTE — Assessment & Plan Note (Signed)
Continue vitamin D replacement.  

## 2023-01-08 NOTE — Assessment & Plan Note (Signed)
Cleared for surgery 

## 2023-01-08 NOTE — Assessment & Plan Note (Signed)
Previously well controlled Continue Synthroid at current dose  Recheck TSH and adjust Synthroid as indicated   

## 2023-01-08 NOTE — Assessment & Plan Note (Signed)
Well controlled.  No changes to medicines. Taking Atorvastatin 10 mg 1 tablet daily, lovaza 1 gm 2 capsules twice daily. Continue repatha. Continue to work on eating a healthy diet and exercise.  Labs drawn today.

## 2023-01-08 NOTE — Assessment & Plan Note (Signed)
On morphine for chronic back pain.

## 2023-01-09 LAB — AEROBIC/ANAEROBIC CULTURE W GRAM STAIN (SURGICAL/DEEP WOUND)
Culture: NO GROWTH
Gram Stain: NONE SEEN

## 2023-01-09 NOTE — Progress Notes (Unsigned)
EP to read

## 2023-01-10 ENCOUNTER — Ambulatory Visit (INDEPENDENT_AMBULATORY_CARE_PROVIDER_SITE_OTHER): Payer: PPO | Admitting: Podiatry

## 2023-01-10 ENCOUNTER — Telehealth: Payer: Self-pay

## 2023-01-10 ENCOUNTER — Encounter: Payer: Self-pay | Admitting: Podiatry

## 2023-01-10 DIAGNOSIS — Z89421 Acquired absence of other right toe(s): Secondary | ICD-10-CM

## 2023-01-10 DIAGNOSIS — L03115 Cellulitis of right lower limb: Secondary | ICD-10-CM

## 2023-01-10 MED ORDER — CLINDAMYCIN HCL 300 MG PO CAPS: 300.0000 mg | ORAL_CAPSULE | Freq: Three times a day (TID) | ORAL | 0 refills | Status: DC

## 2023-01-10 NOTE — Progress Notes (Unsigned)
Chief Complaint  Patient presents with   Post-op Follow-up    RT 1st & 3rd amputation 01/04/23. Has been to ED 3x due to excessive bleeding. Most recent was Sunday. No bleed throughs since they wrapped the surgical site with coban and kerlex. Pt states that they used Xeroform. Some breakthrough pain, she has morphine for other medical condition po bid. Takes that to help, and it does for the most part. Aside from blood no other drainage noted.   Date of surgery: 01/04/2023 Procedure: Right first and third toe amputation with Dr. Annamary Rummage  HPI: 52 y.o. female presents today for postoperative visit following right foot first and third toe amputation.  Pain has been well-controlled.  She did need to go to the ED several times due to excessive bleeding from the surgical site.  She denies any nausea, vomiting, fever, chills, chest pain, shortness of breath.  Dressing is intact today with mild strikethrough present to the surgical site.  He is also following up for left fourth toe ulceration which has been doing well and has improved with use of the hammertoe crest pad.  Denies any nausea, vomiting, fever, chills, chest pain, shortness of breath.  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 2 diabetes mellitus, with fat layer exposed (HCC) 03/27/2020   Gastritis    Genetic susceptibility to malignant neoplasm of breast    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 No longer has    Past Surgical History:  Procedure Laterality Date   AMPUTATION TOE Bilateral 03/04/2022   Procedure: AMPUTATION TOE RIGHT FOOT PARTIAL OR TOTAL GREAT TOE AND SECOND TOE, LEFT FOOT PARTIAL OR TOTAL GREAT TOE, SECOND AND THIRD TOE;  Surgeon: Pilar Plate, DPM;  Location: [facility] OR;  Service: Podiatry;  Laterality: Bilateral;   AMPUTATION TOE Right 01/04/2023   Procedure: R 1ST TOE AMPUTATION, RIGHT 3RD TOE AMPUTATION;  Surgeon: Pilar Plate, DPM;  Location: [facility] ORS;  Service: Orthopedics/Podiatry;  Laterality: Right;   APPENDECTOMY     BACK SURGERY     x 3 lower disc   BILATERAL TOTAL MASTECTOMY WITH AXILLARY LYMPH NODE DISSECTION Bilateral 09/2019   CHOLECYSTECTOMY     nephrolithiasis     ROBOTIC ASSISTED TOTAL HYSTERECTOMY WITH BILATERAL SALPINGO OOPHERECTOMY Bilateral 06/14/2016  Procedure: ROBOTIC ASSISTED TOTAL HYSTERECTOMY WITH BILATERAL SALPINGO OOPHORECTOMY;  Surgeon: Cleda Mccreedy, MD;  Location: [facility] ORS;  Service: Gynecology;  Laterality: Bilateral;    Allergies  Allergen Reactions   Codeine Shortness Of Breath and Other (See Comments)    Other reaction(s): SHOB    Augmentin [Amoxicillin-Pot Clavulanate] Diarrhea   Celebrex [Celecoxib] Other (See Comments)    Unknown reaction   Propranolol Other (See Comments)    Other reaction(s): Unknown   Propranolol Hcl Other (See Comments)    Unknown reaction  ask   Simvastatin Other (See Comments)    Other reaction(s): Unknown   Vytorin  [Ezetimibe-Simvastatin] Other (See Comments)    Unknown reaction  Patient is not aware of an allergy to this medication, ask   Zetia [Ezetimibe] Other (See Comments)    Other reaction(s): Unknown    ROS negative except as stated in HPI   Physical Exam: There were no vitals filed for this visit.  General: The patient is alert and oriented x3 in no acute distress.  Dermatology: Right foot surgical site appreciated status post first and third toe amputations.  Sutures are intact.  No drainage today, some possible gapping noted at the first toe amputation site.  Some scab formation to both toe amputation sites.  Erythema and edema present to the right forefoot.  No malodor.  Left distal fourth toe ulceration appears resolved at this point with minimal hyperkeratotic tissue present.  Vascular: Palpable pedal pulses bilaterally. Capillary refill within normal limits.  Forefoot erythema edema to the right, stated above.  Neurological: Protective sensation grossly diminished bilaterally.  Musculoskeletal Exam: Status post right foot first, second, third toe amputation, left foot history of first, second, third amputation with hammertoe contractures of the remaining digits that are reducible.  Radiographic Exam:  Deferred  Assessment/Plan of Care: 1. S/P amputation of lesser toe, right (HCC)   2. Cellulitis of right foot      Meds ordered this encounter  Medications   clindamycin (CLEOCIN) 300 MG capsule    Sig: Take 1 capsule (300 mg total) by mouth 3 (three) times daily for 10 days.    Dispense:  30 capsule    Refill:  0   None  Discussed clinical findings with patient today.  Plan: -Pathology reviewed, no osteomyelitis, no growth on cultures -Betadine Telfa applied to the surgical site followed by ABD, Kerlix, Ace wrap. -Questionable whether or not some stitches have fallen out due to needing the dressing change several times in the ED since her surgery -Advised patient keep  dressing clean dry intact until follow-up visit -Antibiotics ordered out of abundance of caution due to some swelling and redness about the surgical site -Advised heel touch weightbearing right foot in surgical shoe -Left fourth toe ulceration healed at this point.  Continue with the crest pad.  Will consider tenotomy of this once the surgical site is further along healing -Will have patient follow-up in 1 week for postoperative check   Brogan England L. Marchia Bond, AACFAS [facility]     [address] [place], [place] [address]                Office [phone]  Fax [phone]

## 2023-01-10 NOTE — Telephone Encounter (Signed)
Spoke with patient per [name] request, patient has appt scheduled today at 2:15 at [facility] here in [place] with Dr. Osie Bond regarding post surgical bleeding. States  she has not called Dr. Tomma Rakers office since she has appt with Podiatry.

## 2023-01-11 ENCOUNTER — Encounter: Payer: Self-pay | Admitting: Family Medicine

## 2023-01-11 DIAGNOSIS — Z89421 Acquired absence of other right toe(s): Secondary | ICD-10-CM | POA: Diagnosis not present

## 2023-01-11 DIAGNOSIS — Z89411 Acquired absence of right great toe: Secondary | ICD-10-CM | POA: Diagnosis not present

## 2023-01-11 NOTE — Telephone Encounter (Signed)
 Care team updated and letter sent for eye exam notes.

## 2023-01-12 ENCOUNTER — Other Ambulatory Visit: Payer: PPO

## 2023-01-12 NOTE — Patient Instructions (Signed)
He

## 2023-01-13 ENCOUNTER — Other Ambulatory Visit: Payer: Self-pay

## 2023-01-13 ENCOUNTER — Other Ambulatory Visit: Payer: Self-pay | Admitting: Family Medicine

## 2023-01-13 DIAGNOSIS — G2581 Restless legs syndrome: Secondary | ICD-10-CM

## 2023-01-13 DIAGNOSIS — E1140 Type 2 diabetes mellitus with diabetic neuropathy, unspecified: Secondary | ICD-10-CM

## 2023-01-13 DIAGNOSIS — K7460 Unspecified cirrhosis of liver: Secondary | ICD-10-CM

## 2023-01-13 DIAGNOSIS — F50819 Binge eating disorder, unspecified: Secondary | ICD-10-CM

## 2023-01-13 DIAGNOSIS — E782 Mixed hyperlipidemia: Secondary | ICD-10-CM

## 2023-01-13 DIAGNOSIS — M62838 Other muscle spasm: Secondary | ICD-10-CM

## 2023-01-16 ENCOUNTER — Ambulatory Visit (INDEPENDENT_AMBULATORY_CARE_PROVIDER_SITE_OTHER): Payer: PPO | Admitting: Podiatry

## 2023-01-16 ENCOUNTER — Encounter: Payer: Self-pay | Admitting: Podiatry

## 2023-01-16 ENCOUNTER — Telehealth: Payer: Self-pay | Admitting: Family Medicine

## 2023-01-16 DIAGNOSIS — L03115 Cellulitis of right lower limb: Secondary | ICD-10-CM

## 2023-01-16 DIAGNOSIS — M2042 Other hammer toe(s) (acquired), left foot: Secondary | ICD-10-CM | POA: Diagnosis not present

## 2023-01-16 DIAGNOSIS — Z89411 Acquired absence of right great toe: Secondary | ICD-10-CM | POA: Diagnosis not present

## 2023-01-16 DIAGNOSIS — E1142 Type 2 diabetes mellitus with diabetic polyneuropathy: Secondary | ICD-10-CM

## 2023-01-16 DIAGNOSIS — L97411 Non-pressure chronic ulcer of right heel and midfoot limited to breakdown of skin: Secondary | ICD-10-CM

## 2023-01-16 DIAGNOSIS — E11621 Type 2 diabetes mellitus with foot ulcer: Secondary | ICD-10-CM | POA: Diagnosis not present

## 2023-01-16 DIAGNOSIS — Z89421 Acquired absence of other right toe(s): Secondary | ICD-10-CM | POA: Diagnosis not present

## 2023-01-16 MED ORDER — CLINDAMYCIN HCL 300 MG PO CAPS: 300.0000 mg | ORAL_CAPSULE | Freq: Three times a day (TID) | ORAL | 0 refills | Status: DC

## 2023-01-16 NOTE — Progress Notes (Unsigned)
Chief Complaint  Patient presents with   Follow-up    No bandage change since last visit with [name]. Has not noticed any excessive bleeding. Some tightness but no actual pain. No fever or warmth to site.   Date of surgery: 01/04/2023 Procedure: Right first and third toe amputation with Dr. Annamary Rummage  HPI: 52 y.o. female presents today for postoperative visit following right foot first and third toe amputation.  Pain has been well-controlled.  She denies more strikethrough or incidents with uncontrolled bleeding from the surgical site.  She does present with blister to the right midfoot today which has ruptured, patient reports that she did notice some stinging in this area.  Recurrence of left fourth toe callus noted.  Denies any nausea, vomiting, fever, chills, chest pain, shortness of breath.  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 2 diabetes mellitus, with fat layer exposed (HCC) 03/27/2020   Gastritis    Genetic susceptibility to malignant neoplasm of breast    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 No longer has    Past Surgical History:  Procedure Laterality Date   AMPUTATION TOE Bilateral 03/04/2022   Procedure: AMPUTATION TOE RIGHT FOOT PARTIAL OR TOTAL GREAT TOE AND SECOND TOE, LEFT FOOT PARTIAL OR TOTAL GREAT TOE, SECOND AND THIRD TOE;  Surgeon: Pilar Plate, DPM;  Location: [facility];  Service: Podiatry;  Laterality: Bilateral;   AMPUTATION TOE Right 01/04/2023   Procedure: R 1ST TOE AMPUTATION, RIGHT 3RD TOE AMPUTATION;  Surgeon: Pilar Plate, DPM;  Location: [facility];  Service: Orthopedics/Podiatry;  Laterality: Right;   APPENDECTOMY     BACK SURGERY     x 3 lower disc   BILATERAL TOTAL MASTECTOMY WITH AXILLARY LYMPH NODE DISSECTION Bilateral 09/2019   CHOLECYSTECTOMY     nephrolithiasis     ROBOTIC ASSISTED TOTAL HYSTERECTOMY WITH BILATERAL SALPINGO OOPHERECTOMY Bilateral 06/14/2016   Procedure: ROBOTIC ASSISTED TOTAL HYSTERECTOMY WITH BILATERAL SALPINGO OOPHORECTOMY;  Surgeon: Cleda Mccreedy, MD;  Location: [facility];  Service: Gynecology;  Laterality: Bilateral;    Allergies  Allergen Reactions   Codeine Shortness Of Breath and Other (See Comments)    Other reaction(s): SHOB    Augmentin [Amoxicillin-Pot Clavulanate] Diarrhea   Celebrex [Celecoxib] Other (See Comments)    Unknown reaction  Propranolol Other (See Comments)    Other reaction(s): Unknown   Propranolol Hcl Other (See Comments)    Unknown reaction  ask   Simvastatin Other (See Comments)    Other reaction(s): Unknown   Vytorin [Ezetimibe-Simvastatin] Other (See Comments)    Unknown reaction  Patient is not aware of an allergy to this medication, ask   Zetia [Ezetimibe] Other (See Comments)    Other reaction(s): Unknown    ROS negative except as stated in HPI   Physical Exam: There were no vitals filed for this visit.  General: The patient is alert and oriented x3 in no  acute distress.  Dermatology: Right foot surgical site appreciated status post first and third toe amputations.  Sutures and steri strips are intact.  No drainage today. Some superficial dehiscence appreciated at the first toe amputation site, some possible gapping noted at the first toe amputation site.  Some scab formation to both toe amputation sites.  Erythema and edema present to the right forefoot.  No malodor.  There is blister formation to the plantar right midfoot which has ruptured.  Upon debridement of the deroofed skin, area measures 4.5 x 5.5 cm with partial-thickness breakdown of skin, localized erythema, no surrounding swelling or redness, no undermining or sinus tracts appreciated.  Left distal fourth toe ulceration appears resolved at this point with minimal hyperkeratotic tissue present.        Vascular: Palpable pedal pulses bilaterally. Capillary refill within normal limits.  Forefoot erythema edema to the right, stated above.  Neurological: Protective sensation grossly diminished bilaterally.  Musculoskeletal Exam: Status post right foot first, second, third toe amputation, left foot history of first, second, third amputation with hammertoe contractures of the remaining digits that are reducible.  Radiographic Exam:  Deferred  Assessment/Plan of Care: 1. S/P amputation of lesser toe, right (HCC)   2. Cellulitis of right foot   3. DM type 2 with diabetic peripheral neuropathy (HCC)   4. Hammertoe of left foot   5. Diabetic ulcer of right midfoot associated with type 2 diabetes mellitus, limited to breakdown of skin (HCC)      Meds ordered this encounter  Medications   clindamycin (CLEOCIN) 300 MG capsule    Sig: Take 1 capsule (300 mg total) by mouth 3 (three) times daily for 10 days.    Dispense:  30 capsule    Refill:  0   None  Discussed clinical findings with patient today.  Plan: - Plan to continue patient on antibiotics due to the development of  new right plantar foot blister -Betadine Telfa applied to the surgical site followed by ABD, Kerlix, Ace wrap.  New plantar wound was cleansed with wound cleanser and Xeroform applied and incorporated within the dressing.  Will have patient perform dressing changes every day to every other day.  Home dressing supplies ordered. - Advised patient to practice limited stance and gait is much as possible while she recovers, importance of minimizing weightbearing as much as possible was stressed with patient. -Left fourth toe ulceration healed at this point.  Continue with the crest pad.  Will consider tenotomy of this once the surgical site is further along healing. Overlying callus pared down today. -Will have patient follow-up in 1 week for wound care.   Brittanya Winburn L. Marchia Bond, AACFAS [facility]     [address]  [place], [place] [address]                Office [phone]  Fax [phone]

## 2023-01-16 NOTE — Telephone Encounter (Signed)
[facility] SENT THE REFERRAL

## 2023-01-19 ENCOUNTER — Telehealth: Payer: Self-pay

## 2023-01-19 NOTE — Telephone Encounter (Signed)
Vernona Rieger, RN with [facility] (like doctor's making house calls) not home health called stating that patient is struggling with wound care. She states that patient does not know where wound care supplies were sent and has not received them yet. She also states that patient is requesting home health, skilled nursing, for wound care.  [phone]

## 2023-01-20 DIAGNOSIS — B029 Zoster without complications: Secondary | ICD-10-CM | POA: Diagnosis not present

## 2023-01-20 NOTE — Telephone Encounter (Signed)
Linda [name] called again today about getting help with wound care supplies. Please advise

## 2023-01-23 ENCOUNTER — Encounter: Payer: Self-pay | Admitting: Podiatry

## 2023-01-23 ENCOUNTER — Ambulatory Visit (INDEPENDENT_AMBULATORY_CARE_PROVIDER_SITE_OTHER): Payer: PPO | Admitting: Podiatry

## 2023-01-23 ENCOUNTER — Telehealth: Payer: Self-pay

## 2023-01-23 DIAGNOSIS — M2042 Other hammer toe(s) (acquired), left foot: Secondary | ICD-10-CM | POA: Diagnosis not present

## 2023-01-23 DIAGNOSIS — E1142 Type 2 diabetes mellitus with diabetic polyneuropathy: Secondary | ICD-10-CM | POA: Diagnosis not present

## 2023-01-23 DIAGNOSIS — Z89421 Acquired absence of other right toe(s): Secondary | ICD-10-CM | POA: Diagnosis not present

## 2023-01-23 NOTE — Telephone Encounter (Signed)
Spoke with patient about referral. She stated she needed a nurse to help with wound care. I informed patient the [facility] clinic ordered home health services and to follow-up with office. Pt has a follow-up appointment with clinic today and she was ask about home health referral.

## 2023-01-23 NOTE — Telephone Encounter (Signed)
Copied from CRM [id number]. Topic: Referral - Question >> Jan 20, 2023  2:13 PM Fonda Kinder J wrote: Reason for CRM: Pt has a question about referral to [facility]. I advised pt of note left in encounter about the referring provider and the pt states she's already spoken to them and would like to speak to someone from the office

## 2023-01-23 NOTE — Progress Notes (Signed)
Chief Complaint  Patient presents with   wound care    Rt post op 1 busted stitch, bliter on plantar of rt. Pt state home health has not been out yet. Something from our office needs to be done. Pt also state she is pos for shingles but should not be contag.  A1c 6.7  Does have excessive bleeding.  Date of surgery: 01/04/2023 Procedure: Right first and third toe amputation with Dr. Annamary Rummage  HPI: 52 y.o. female presents today for postoperative visit following right foot first and third toe amputation.  Pain has been well-controlled.  She has been changing the dressing herself.  Improvement of right plantar foot blister noted.  She requesting home dressing supplies.  Denies any nausea, vomiting, fever, chills, chest pain, shortness of breath.  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 2 diabetes mellitus, with fat layer exposed (HCC) 03/27/2020   Gastritis    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 No longer has    Past Surgical History:  Procedure Laterality Date   AMPUTATION TOE Bilateral 03/04/2022   Procedure: AMPUTATION TOE RIGHT FOOT PARTIAL OR TOTAL GREAT TOE AND SECOND TOE, LEFT FOOT PARTIAL OR TOTAL GREAT TOE, SECOND AND THIRD TOE;  Surgeon: Pilar Plate, DPM;  Location: [facility];  Service: Podiatry;  Laterality: Bilateral;   AMPUTATION TOE Right 01/04/2023   Procedure: R 1ST TOE AMPUTATION, RIGHT 3RD TOE AMPUTATION;  Surgeon: Pilar Plate, DPM;  Location: [facility];  Service: Orthopedics/Podiatry;  Laterality: Right;   APPENDECTOMY     BACK SURGERY     x 3 lower disc   BILATERAL TOTAL MASTECTOMY WITH AXILLARY LYMPH NODE DISSECTION Bilateral 09/2019   CHOLECYSTECTOMY     nephrolithiasis     ROBOTIC ASSISTED TOTAL HYSTERECTOMY WITH BILATERAL SALPINGO OOPHERECTOMY Bilateral 06/14/2016   Procedure: ROBOTIC ASSISTED TOTAL HYSTERECTOMY WITH BILATERAL SALPINGO OOPHORECTOMY;  Surgeon: Cleda Mccreedy, MD;  Location: [facility];  Service: Gynecology;  Laterality: Bilateral;    Allergies  Allergen Reactions   Codeine Shortness Of Breath and Other (See Comments)    Other reaction(s): SHOB    Augmentin [Amoxicillin-Pot Clavulanate] Diarrhea   Celebrex [Celecoxib] Other (See Comments)    Unknown reaction   Propranolol  Other (See Comments)    Other reaction(s): Unknown   Propranolol Hcl Other (See Comments)    Unknown reaction  ask   Simvastatin Other (See Comments)    Other reaction(s): Unknown   Vytorin [Ezetimibe-Simvastatin] Other (See Comments)    Unknown reaction  Patient is not aware of an allergy to this medication, ask   Zetia [Ezetimibe] Other (See Comments)    Other reaction(s): Unknown    ROS negative except as stated in HPI   Physical Exam: There were no vitals filed for this visit.  General: The patient is alert and oriented x3 in no acute distress.  Dermatology: Right foot  surgical site appreciated status post first and third toe amputations.  Some signs of superficial dehiscence right second toe amputation site noted however there is stable scab present at this location.  Previously noted gapping to the incisions appears about the same with scab present in these areas do appear to be healing.  All of the remaining sutures left intact.  Right midfoot blister location appears to be healing well with central area reepithelialized.  There are 3 focal areas on the periphery measuring each less than a centimeter in length and less than 0.4 cm in width with fat layer exposed that are superficial and stable appearing without signs of infection at this point.  Left distal fourth toe ulceration appears resolved at this point with minimal hyperkeratotic tissue present.   Vascular: Palpable pedal pulses bilaterally. Capillary refill within normal limits.  Forefoot erythema edema to the right, stated above.  Neurological: Protective sensation grossly diminished bilaterally.  Musculoskeletal Exam: Status post right foot first, second, third toe amputation, left foot history of first, second, third amputation with hammertoe contractures of the remaining digits that are reducible.  Radiographic Exam:  Deferred  Assessment/Plan of Care: 1. S/P amputation of lesser toe, right (HCC)   2. DM type 2 with diabetic peripheral neuropathy (HCC)   3. Hammertoe of left foot      No orders of the defined types were placed in this encounter.  None  Discussed clinical findings with patient today.  Plan: - No signs of acute infection at this point - Xeroform applied to the surgical site followed by Kerlix, Ace wrap.  New plantar wound was cleansed with wound cleanser and Xeroform applied and incorporated within the dressing.  Will have patient perform dressing changes every day to every other day.  Home dressing supplies ordered. - Advised patient to practice limited stance and gait  is much as possible while she recovers, importance of minimizing weightbearing as much as possible was stressed with patient. -Left fourth toe ulceration healed at this point.  Continue with the crest pad.  Will consider tenotomy of this once the surgical site is further along healing. Overlying callus pared down today. -Will have patient follow-up in 2 weeks for wound care.  Eventual plan for tenotomy of the left fourth and fifth toes, ideally wait for right foot to progress back to regular shoes before proceeding with this.   Kwabena Strutz L. Marchia Bond, AACFAS [facility]     [address], [place] [address]                Office [phone]  Fax [phone]

## 2023-01-23 NOTE — Progress Notes (Signed)
 Subjective:  Patient ID: Linda [name], female    DOB: [birth year]  Age: 52 y.o. MRN: [id number]  Chief Complaint  Patient presents with   Medical Management of Chronic Issues    HPI   Diabetes:  Complications: HYPERTENSION, CKD Glucose checking: CGM Free Style Libre 2. Glucose logs: see download below.  Hypoglycemia: rarely  Most recent A1C: 6.7. Current medications: Mounjaro  7.5mg  once weekly, Synjardy  12.5 mg/ 1000 mg 1 tablet twice daily, Tresiba  inject 60 units daily. Last Eye Exam: scheduled for August 2024.  Foot checks: Daily  Hyperlipidemia: Current medications: Taking Atorvastatin  10 mg 1 tablet daily, lovaza  1 gm 2 capsules twice daily.   Hypertensive kidney disease with Stage 3B chronic kidney disease: Complications: DM Current medications: Losartan  potassium 50 mg take 1 tablet daily, Potassium Chloride  10 meq take 2 capsules twice daily.  Acquired Hypothyroidism: Patient is currently taking Levothyroxine  75 mcg daily.  Chronic Pain Syndrome: Chronic back pain treated with Morphine  30 mg take 1 tablet twice daily, Gabapentin  300 mg take 2 capsules three times a day.   Binge eating disorder: Patient is currently taking Vyvanse  70 mg daily.  Vitamin D  deficiency: Patient is currently taking Vitamin D  50,000 units 1 capsule weekly.  GERD:  Pantoprazole  40 mg 1 tablet TWICE DAILY.  Depression, mild, recurrent: Patient is currently taking Fetzima  80 mg daily, Bupropion  XL 300 mg 1 tablet daily, Buspirone  5 mg take 1 tablet three times a day, vraylar  3 mg daily.   Insomnia: rozerem  8 mg before bed.   IBS D: has dicyclomine  that uses as needed.   Had recent toe amputation about 2-3 weeks ago. She is following with Dr. Malvin. She had some bleeding issues, but this has resolved.      01/24/2023    7:30 AM 01/02/2023    2:01 PM 10/04/2022    3:49 PM 09/14/2022    8:01 AM 08/17/2022   10:17 AM  Depression screen PHQ 2/9  Decreased Interest 0 1 1 1 1    Down, Depressed, Hopeless 1 1 1 1 1   PHQ - 2 Score 1 2 2 2 2   Altered sleeping 1 2 0 3 1  Tired, decreased energy 1 2 1 2 1   Change in appetite 2 1 1 2 1   Feeling bad or failure about yourself  0 0 1 0 1  Trouble concentrating 2 1 0 0 1  Moving slowly or fidgety/restless 2 2 1 1 1   Suicidal thoughts 0 0 0 0 0  PHQ-9 Score 9 10 6 10 8   Difficult doing work/chores Very difficult Somewhat difficult Somewhat difficult Somewhat difficult Somewhat difficult        01/02/2023    2:01 PM  Fall Risk   Falls in the past year? 1  Number falls in past yr: 0  Injury with Fall? 0  Risk for fall due to : No Fall Risks  Follow up Falls evaluation completed    Patient Care Team: Sherre Clapper, MD as PCP - General (Internal Medicine) Cornelius Wanda DEL, MD as Consulting Physician (Oncology) Bertrum Rosina HERO, RN as [facility] Care Management (General Practice) Erasmo Bernardino BRAVO, OD (Optometry)   Review of Systems  Constitutional:  Negative for fatigue.  HENT:  Negative for congestion, ear pain and sore throat.   Respiratory:  Negative for cough and shortness of breath.   Cardiovascular:  Negative for chest pain.  Gastrointestinal:  Negative for abdominal pain, constipation, diarrhea, nausea and vomiting.  Genitourinary:  Negative for  dysuria, frequency and urgency.  Musculoskeletal:  Negative for arthralgias, back pain and myalgias.  Skin:  Positive for rash (under left breast and back).  Neurological:  Negative for dizziness and headaches.  Psychiatric/Behavioral:  Negative for agitation and sleep disturbance. The patient is not nervous/anxious.     Current Outpatient Medications on File Prior to Visit  Medication Sig Dispense Refill   acetaminophen  (TYLENOL ) 500 MG tablet Take 500 mg by mouth every 6 (six) hours as needed for moderate pain or mild pain.     albuterol  (VENTOLIN  HFA) 108 (90 Base) MCG/ACT inhaler INHALE TWO PUFFS BY MOUTH INTO LUNGS every SIX hours orn FOR WHEEZING AND/OR  SHORTNESS OF BREATH 8.5 g 1   amitriptyline  (ELAVIL ) 25 MG tablet TAKE ONE TABLET BY MOUTH AT BEDTIME 30 tablet 5   atorvastatin  (LIPITOR) 10 MG tablet TAKE ONE TABLET BY MOUTH EVERY EVENING 90 tablet 1   Blood Glucose Monitoring Suppl (ONETOUCH VERIO REFLECT) w/Device KIT AS DIRECTED     Blood Pressure Monitoring (SPHYGMOMANOMETER) MISC 1 each by Does not apply route daily in the afternoon. 1 each 0   buPROPion  (WELLBUTRIN  XL) 300 MG 24 hr tablet Take 1 tablet (300 mg total) by mouth every morning. 90 tablet 3   clotrimazole -betamethasone  (LOTRISONE ) cream Apply twice daily as needed to area of concern 45 g 2   Continuous Blood Gluc Receiver (FREESTYLE LIBRE 2 READER) DEVI E11.69 Check blood sugar 4 times daily as directed 1 each 0   Continuous Glucose Sensor (FREESTYLE LIBRE 3 PLUS SENSOR) MISC Change sensor every 15 days. 6 each 1   cyclobenzaprine  (FLEXERIL ) 10 MG tablet TAKE ONE TABLET BY MOUTH EVERY 8 HOURS AS NEEDED FOR MUSCLE SPASMS 270 tablet 1   dicyclomine  (BENTYL ) 20 MG tablet TAKE ONE TABLET BY MOUTH BEFORE MEALS AND AT BEDTIME AS NEEDED FOR STOMACH CRAMPING 180 tablet 1   ferrous sulfate 325 (65 FE) MG tablet Take 325 mg by mouth every evening.     gabapentin  (NEURONTIN ) 400 MG capsule TAKE 2 CAPSULES BY MOUTH 3 TIMES DAILY 180 capsule 1   glucose blood test strip Use as instructed to check FBS daily. E11.40 100 each 12   Insulin  Pen Needle (BD PEN NEEDLE NANO U/F) 32G X 4 MM MISC Use new needle with each injection. 30 each 2   Lancets (ONETOUCH DELICA PLUS LANCET30G) MISC USE TO check blood glucose 2-3 times daily AS DIRECTED 100 each 3   Levomilnacipran  HCl ER (FETZIMA ) 80 MG CP24 Take 1 capsule by mouth every evening. 90 capsule 1   levothyroxine  (SYNTHROID ) 75 MCG tablet Take 1 tablet (75 mcg total) by mouth daily before breakfast. 90 tablet 3   losartan  (COZAAR ) 50 MG tablet TAKE ONE TABLET BY MOUTH EVERY EVENING 90 tablet 1   Magnesium  500 MG CAPS Take 1 capsule (500 mg total)  by mouth daily. 90 capsule 1   morphine  (MS CONTIN ) 30 MG 12 hr tablet Take 1 tablet (30 mg total) by mouth every 12 (twelve) hours. 60 tablet 0   Multiple Vitamin (MULTIVITAMIN WITH MINERALS) TABS tablet Take 1 tablet by mouth daily. Solar ray     mupirocin  ointment (BACTROBAN ) 2 % Apply 1 Application topically daily. 22 g 0   omega-3 acid ethyl esters (LOVAZA ) 1 g capsule TAKE 2 CAPSULES BY MOUTH TWICE DAILY 360 capsule 1   ondansetron  (ZOFRAN ) 4 MG tablet Take 1 tablet (4 mg total) by mouth every 4 (four) hours as needed for nausea. 90 tablet 3  OVER THE COUNTER MEDICATION Take 1 tablet by mouth in the morning and at bedtime. Lutein for eyes     pantoprazole  (PROTONIX ) 40 MG tablet Take 1 tablet (40 mg total) by mouth 2 (two) times daily. 180 tablet 1   polyethylene glycol (MIRALAX  / GLYCOLAX ) 17 g packet Take 17 g by mouth daily as needed for moderate constipation.     potassium chloride  (MICRO-K ) 10 MEQ CR capsule Take 2 capsules (20 mEq total) by mouth 2 (two) times daily. 360 capsule 3   Probiotic Product (PROBIOTIC PO) Take 1 capsule by mouth in the morning. 3.2 billion cfu     prochlorperazine  (COMPAZINE ) 5 MG tablet TAKE ONE TABLET BY MOUTH every SIX hours AS NEEDED FOR NAUSEA AND VOMITING 30 tablet 1   ramelteon  (ROZEREM ) 8 MG tablet TAKE ONE TABLET BY MOUTH AT BEDTIME 90 tablet 1   rOPINIRole  (REQUIP ) 0.25 MG tablet TAKE ONE TABLET BY MOUTH AT BEDTIME 90 tablet 1   SYNJARDY  12.05-998 MG TABS TAKE ONE TABLET BY MOUTH TWICE DAILY 60 tablet 1   TRESIBA  FLEXTOUCH 200 UNIT/ML FlexTouch Pen INJECT 60 UNITS SUBCUTANEOUSLY DAILY 9 mL 3   valACYclovir (VALTREX) 1000 MG tablet Take by mouth.     Vitamin D , Ergocalciferol , (DRISDOL ) 1.25 MG (50000 UNIT) CAPS capsule TAKE ONE CAPSULE BY MOUTH EVERY FRIDAY AT 8AM 12 capsule 1   VRAYLAR  3 MG capsule TAKE ONE CAPSULE BY MOUTH EVERY DAY 90 capsule 0   VYVANSE  70 MG capsule TAKE ONE CAPSULE BY MOUTH EVERY DAY 30 capsule 0   Current  Facility-Administered Medications on File Prior to Visit  Medication Dose Route Frequency Provider Last Rate Last Admin   sodium chloride  flush (NS) 0.9 % injection 10 mL  10 mL Intracatheter PRN Mosher, Kelli A, PA-C   10 mL at 02/19/21 1004   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 2 diabetes mellitus, with fat layer exposed (HCC) 03/27/2020   Gastritis    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 No longer has   Past Surgical History:  Procedure Laterality Date   AMPUTATION TOE Bilateral 03/04/2022   Procedure: AMPUTATION TOE RIGHT FOOT PARTIAL OR TOTAL GREAT TOE AND SECOND TOE, LEFT FOOT PARTIAL OR TOTAL GREAT TOE, SECOND  AND THIRD TOE;  Surgeon: Malvin Marsa FALCON, DPM;  Location: [facility];  Service: Podiatry;  Laterality: Bilateral;   AMPUTATION TOE Right 01/04/2023   Procedure: R 1ST TOE AMPUTATION, RIGHT 3RD TOE AMPUTATION;  Surgeon:  Malvin Marsa FALCON, DPM;  Location: [facility];  Service: Orthopedics/Podiatry;  Laterality: Right;   APPENDECTOMY     BACK SURGERY     x 3 lower disc   BILATERAL TOTAL MASTECTOMY WITH AXILLARY LYMPH NODE DISSECTION Bilateral 09/2019   CHOLECYSTECTOMY     nephrolithiasis     ROBOTIC ASSISTED TOTAL HYSTERECTOMY WITH BILATERAL SALPINGO OOPHERECTOMY Bilateral 06/14/2016   Procedure: ROBOTIC ASSISTED TOTAL HYSTERECTOMY WITH BILATERAL SALPINGO OOPHORECTOMY;  Surgeon: Dodie Shadow, MD;  Location: [facility];  Service: Gynecology;  Laterality: Bilateral;    Family History  Problem Relation Age of Onset   Cancer Mother        breast   CAD Father    Diabetes Father    Heart failure Father    Cancer Father        renal carcinoma   Kidney failure Father    Cancer Brother 34       renal carcinoma.   Stroke Paternal Grandmother    Social History   Socioeconomic History   Marital status: Divorced    Spouse name: Not on file   Number of children: Not on file   Years of education: Not on file   Highest education level: Associate degree: academic program  Occupational History   Occupation: disabled  Tobacco Use   Smoking status: Never   Smokeless tobacco: Never  Vaping Use   Vaping status: Never Used  Substance and Sexual Activity   Alcohol use: No   Drug use: No   Sexual activity: Yes  Other Topics Concern   Not on file  Social History Narrative   wears sunscreen, brushes and flosses daily, see's dentist bi-annually, has smoke/carbon monoxide detectors, wears a seatbelt and practices gun safety   Social Drivers of Health   Financial Resource Strain: High Risk (01/21/2023)   Overall Financial Resource Strain (CARDIA)    Difficulty of Paying Living Expenses: Very hard  Food Insecurity: Food Insecurity Present (01/21/2023)   Hunger Vital Sign    Worried About Running Out of Food in the Last Year: Sometimes true    Ran Out of Food in the Last Year: Often true  Transportation Needs:  No Transportation Needs (01/21/2023)   PRAPARE - Administrator, Civil Service (Medical): No    Lack of Transportation (Non-Medical): No  Physical Activity: Inactive (01/21/2023)   Exercise Vital Sign    Days of Exercise per Week: 0 days    Minutes of Exercise per Session: 0 min  Stress: Stress Concern Present (01/21/2023)   [employer] of Occupational Health - Occupational Stress Questionnaire    Feeling of Stress : Very much  Social Connections: Moderately Isolated (01/21/2023)   Social Connection and Isolation Panel [NHANES]    Frequency of Communication with Friends and Family: More than three times a week    Frequency of Social Gatherings with Friends and Family: Never    Attends Religious Services: 1 to 4 times per year    Active Member of [employer] or Organizations: No    Attends Banker Meetings: Never    Marital Status: Divorced    Objective:  BP 104/60 (BP Location: Right Arm, Patient Position: Sitting, Cuff Size: Large)   Pulse (!) 101   Temp (!) 97.3 F (  36.3 C) (Temporal)   Ht 5' 2 (1.575 m)   Wt 207 lb (93.9 kg)   LMP 06/13/2009   SpO2 98%   BMI 37.86 kg/m      01/24/2023    7:22 AM 01/07/2023    2:43 PM 01/07/2023    1:30 PM  BP/Weight  Systolic BP 104 123 131  Diastolic BP 60 77 73  Wt. (Lbs) 207    BMI 37.86 kg/m2      Physical Exam Vitals reviewed.  Constitutional:      Appearance: Normal appearance. She is obese.  Neck:     Vascular: No carotid bruit.  Cardiovascular:     Rate and Rhythm: Normal rate and regular rhythm.     Heart sounds: Normal heart sounds.  Pulmonary:     Effort: Pulmonary effort is normal. No respiratory distress.     Breath sounds: Normal breath sounds.  Abdominal:     General: Abdomen is flat. Bowel sounds are normal.     Palpations: Abdomen is soft.     Tenderness: There is no abdominal tenderness.  Skin:    Findings: Rash (drying up on abdomen, but still tender.) present.   Neurological:     Mental Status: She is alert and oriented to person, place, and time.  Psychiatric:        Mood and Affect: Mood normal.        Behavior: Behavior normal.   Right foot: wrapped.  Left foot has some old amputations.   Diabetic Foot Exam - Simple   No data filed      Lab Results  Component Value Date   WBC 3.9 (L) 01/07/2023   HGB 11.1 (L) 01/07/2023   HCT 36.5 01/07/2023   PLT 65 (L) 01/07/2023   GLUCOSE 141 (H) 12/21/2022   CHOL 156 01/24/2023   TRIG 169 (H) 01/24/2023   HDL 43 01/24/2023   LDLCALC 84 01/24/2023   ALT 17 12/21/2022   AST 24 12/21/2022   NA 140 12/21/2022   K 4.0 12/21/2022   CL 105 12/21/2022   CREATININE 1.00 12/21/2022   BUN 17 12/21/2022   CO2 25 12/21/2022   TSH 1.270 01/02/2023   INR 1.1 12/20/2022   HGBA1C 6.7 (H) 12/21/2022   MICROALBUR 30 10/09/2019      Assessment & Plan:    Type 2 diabetes mellitus with diabetic neuropathy, with long-term current use of insulin  (HCC) Assessment & Plan: At goal.  A1c well controlled at 6.7. -Consider upgrading to [facility] 3 Plus sensor for continuous A1c monitoring, pending insurance approval.   Mixed hyperlipidemia Assessment & Plan: Well controlled.  No changes to medicines. Taking Atorvastatin  10 mg 1 tablet daily, lovaza  1 gm 2 capsules twice daily. Continue repatha. Continue to work on eating a healthy diet and exercise.  Labs drawn today.  Orders: -     Lipid panel  Acquired hypothyroidism Assessment & Plan: Previously well controlled Continue Synthroid  at current dose  Recheck TSH and adjust Synthroid  as indicated    Sinus tachycardia Assessment & Plan: Remain off  Metoprolol  Waiting on Holter monitor when it come through mail  Orders: -     Metoprolol  Succinate ER; Take 0.5 tablets (12.5 mg total) by mouth daily.  Dispense: 45 tablet; Refill: 0  Post herpetic neuralgia -     Lidocaine ; Place 3 patches onto the skin daily. Remove & Discard patch  within 12 hours or as directed by MD  Dispense: 90 patch; Refill: 0  Meds ordered this encounter  Medications   metoprolol  succinate (TOPROL -XL) 25 MG 24 hr tablet    Sig: Take 0.5 tablets (12.5 mg total) by mouth daily.    Dispense:  45 tablet    Refill:  0   lidocaine  (LIDODERM ) 5 %    Sig: Place 3 patches onto the skin daily. Remove & Discard patch within 12 hours or as directed by MD    Dispense:  90 patch    Refill:  0    Orders Placed This Encounter  Procedures   Lipid panel     Follow-up: Return in about 3 months (around 04/24/2023) for chronic fasting.   I,Marla I Leal-Borjas,acting as a scribe for Abigail Free, MD.,have documented all relevant documentation on the behalf of Abigail Free, MD,as directed by  Abigail Free, MD while in the presence of Abigail Free, MD.   An After Visit Summary was printed and given to the patient.  Abigail Free, MD [facility] [phone]

## 2023-01-23 NOTE — Telephone Encounter (Signed)
I agree this should be ordered by podiatry. [name]

## 2023-01-24 ENCOUNTER — Encounter: Payer: Self-pay | Admitting: Family Medicine

## 2023-01-24 ENCOUNTER — Ambulatory Visit (INDEPENDENT_AMBULATORY_CARE_PROVIDER_SITE_OTHER): Payer: PPO | Admitting: Family Medicine

## 2023-01-24 VITALS — BP 104/60 | HR 101 | Temp 97.3°F | Ht 62.0 in | Wt 207.0 lb

## 2023-01-24 DIAGNOSIS — E039 Hypothyroidism, unspecified: Secondary | ICD-10-CM

## 2023-01-24 DIAGNOSIS — R000 Tachycardia, unspecified: Secondary | ICD-10-CM | POA: Diagnosis not present

## 2023-01-24 DIAGNOSIS — B0229 Other postherpetic nervous system involvement: Secondary | ICD-10-CM | POA: Diagnosis not present

## 2023-01-24 DIAGNOSIS — E782 Mixed hyperlipidemia: Secondary | ICD-10-CM | POA: Diagnosis not present

## 2023-01-24 DIAGNOSIS — E1140 Type 2 diabetes mellitus with diabetic neuropathy, unspecified: Secondary | ICD-10-CM | POA: Diagnosis not present

## 2023-01-24 DIAGNOSIS — F330 Major depressive disorder, recurrent, mild: Secondary | ICD-10-CM | POA: Diagnosis not present

## 2023-01-24 DIAGNOSIS — N1832 Chronic kidney disease, stage 3b: Secondary | ICD-10-CM

## 2023-01-24 DIAGNOSIS — E1122 Type 2 diabetes mellitus with diabetic chronic kidney disease: Secondary | ICD-10-CM

## 2023-01-24 DIAGNOSIS — I129 Hypertensive chronic kidney disease with stage 1 through stage 4 chronic kidney disease, or unspecified chronic kidney disease: Secondary | ICD-10-CM

## 2023-01-24 DIAGNOSIS — Z794 Long term (current) use of insulin: Secondary | ICD-10-CM | POA: Diagnosis not present

## 2023-01-24 MED ORDER — METOPROLOL SUCCINATE ER 25 MG PO TB24: 12.5000 mg | ORAL_TABLET | Freq: Every day | ORAL | 0 refills | Status: DC

## 2023-01-24 MED ORDER — LIDOCAINE 5 % EX PTCH: 3.0000 | MEDICATED_PATCH | CUTANEOUS | 0 refills | Status: DC

## 2023-01-24 NOTE — Patient Instructions (Signed)
Remain off metoprolol.

## 2023-01-25 ENCOUNTER — Other Ambulatory Visit: Payer: Self-pay | Admitting: Family Medicine

## 2023-01-25 DIAGNOSIS — E1140 Type 2 diabetes mellitus with diabetic neuropathy, unspecified: Secondary | ICD-10-CM

## 2023-01-25 LAB — LIPID PANEL
Chol/HDL Ratio: 3.6 {ratio} (ref 0.0–4.4)
Cholesterol, Total: 156 mg/dL (ref 100–199)
HDL: 43 mg/dL (ref >39)
LDL Chol Calc (NIH): 84 mg/dL (ref 0–99)
Triglycerides: 169 mg/dL — ABNORMAL HIGH (ref 0–149)
VLDL Cholesterol Cal: 29 mg/dL (ref 5–40)

## 2023-01-26 ENCOUNTER — Other Ambulatory Visit: Payer: Self-pay | Admitting: Family Medicine

## 2023-01-26 DIAGNOSIS — Z9181 History of falling: Secondary | ICD-10-CM | POA: Diagnosis not present

## 2023-01-26 DIAGNOSIS — D6949 Other primary thrombocytopenia: Secondary | ICD-10-CM | POA: Diagnosis not present

## 2023-01-26 DIAGNOSIS — G894 Chronic pain syndrome: Secondary | ICD-10-CM | POA: Diagnosis not present

## 2023-01-26 DIAGNOSIS — F32A Depression, unspecified: Secondary | ICD-10-CM | POA: Diagnosis not present

## 2023-01-26 DIAGNOSIS — M869 Osteomyelitis, unspecified: Secondary | ICD-10-CM | POA: Diagnosis not present

## 2023-01-26 DIAGNOSIS — G250 Essential tremor: Secondary | ICD-10-CM | POA: Diagnosis not present

## 2023-01-26 DIAGNOSIS — F330 Major depressive disorder, recurrent, mild: Secondary | ICD-10-CM | POA: Diagnosis not present

## 2023-01-26 DIAGNOSIS — Z7985 Long-term (current) use of injectable non-insulin antidiabetic drugs: Secondary | ICD-10-CM | POA: Diagnosis not present

## 2023-01-26 DIAGNOSIS — Z87442 Personal history of urinary calculi: Secondary | ICD-10-CM | POA: Diagnosis not present

## 2023-01-26 DIAGNOSIS — Z794 Long term (current) use of insulin: Secondary | ICD-10-CM | POA: Diagnosis not present

## 2023-01-26 DIAGNOSIS — Z4781 Encounter for orthopedic aftercare following surgical amputation: Secondary | ICD-10-CM | POA: Diagnosis not present

## 2023-01-26 DIAGNOSIS — Z8701 Personal history of pneumonia (recurrent): Secondary | ICD-10-CM | POA: Diagnosis not present

## 2023-01-26 DIAGNOSIS — S90821A Blister (nonthermal), right foot, initial encounter: Secondary | ICD-10-CM | POA: Diagnosis not present

## 2023-01-26 DIAGNOSIS — I10 Essential (primary) hypertension: Secondary | ICD-10-CM | POA: Diagnosis not present

## 2023-01-26 DIAGNOSIS — E039 Hypothyroidism, unspecified: Secondary | ICD-10-CM | POA: Diagnosis not present

## 2023-01-26 DIAGNOSIS — Z89412 Acquired absence of left great toe: Secondary | ICD-10-CM | POA: Diagnosis not present

## 2023-01-26 DIAGNOSIS — D649 Anemia, unspecified: Secondary | ICD-10-CM | POA: Diagnosis not present

## 2023-01-26 DIAGNOSIS — Z89422 Acquired absence of other left toe(s): Secondary | ICD-10-CM | POA: Diagnosis not present

## 2023-01-26 DIAGNOSIS — E1169 Type 2 diabetes mellitus with other specified complication: Secondary | ICD-10-CM | POA: Diagnosis not present

## 2023-01-26 DIAGNOSIS — K219 Gastro-esophageal reflux disease without esophagitis: Secondary | ICD-10-CM | POA: Diagnosis not present

## 2023-01-26 DIAGNOSIS — E11621 Type 2 diabetes mellitus with foot ulcer: Secondary | ICD-10-CM | POA: Diagnosis not present

## 2023-01-26 DIAGNOSIS — L97522 Non-pressure chronic ulcer of other part of left foot with fat layer exposed: Secondary | ICD-10-CM | POA: Diagnosis not present

## 2023-01-26 DIAGNOSIS — L97528 Non-pressure chronic ulcer of other part of left foot with other specified severity: Secondary | ICD-10-CM | POA: Diagnosis not present

## 2023-01-26 DIAGNOSIS — E1142 Type 2 diabetes mellitus with diabetic polyneuropathy: Secondary | ICD-10-CM | POA: Diagnosis not present

## 2023-01-26 DIAGNOSIS — F419 Anxiety disorder, unspecified: Secondary | ICD-10-CM | POA: Diagnosis not present

## 2023-01-26 DIAGNOSIS — K7460 Unspecified cirrhosis of liver: Secondary | ICD-10-CM | POA: Diagnosis not present

## 2023-01-26 DIAGNOSIS — Z6841 Body Mass Index (BMI) 40.0 and over, adult: Secondary | ICD-10-CM | POA: Diagnosis not present

## 2023-01-26 DIAGNOSIS — T8781 Dehiscence of amputation stump: Secondary | ICD-10-CM | POA: Diagnosis not present

## 2023-01-26 DIAGNOSIS — E782 Mixed hyperlipidemia: Secondary | ICD-10-CM | POA: Diagnosis not present

## 2023-01-26 DIAGNOSIS — G4730 Sleep apnea, unspecified: Secondary | ICD-10-CM | POA: Diagnosis not present

## 2023-01-26 DIAGNOSIS — K7581 Nonalcoholic steatohepatitis (NASH): Secondary | ICD-10-CM | POA: Diagnosis not present

## 2023-01-26 DIAGNOSIS — Z853 Personal history of malignant neoplasm of breast: Secondary | ICD-10-CM | POA: Diagnosis not present

## 2023-01-27 ENCOUNTER — Encounter: Payer: Self-pay | Admitting: Family Medicine

## 2023-01-27 NOTE — Assessment & Plan Note (Signed)
 Previously well controlled Continue Synthroid at current dose  Recheck TSH and adjust Synthroid as indicated

## 2023-01-27 NOTE — Assessment & Plan Note (Signed)
 At goal.  A1c well controlled at 6.7. -Consider upgrading to [facility] 3 Plus sensor for continuous A1c monitoring, pending insurance approval.

## 2023-01-27 NOTE — Assessment & Plan Note (Signed)
 Well controlled.  No changes to medicines. Taking Atorvastatin 10 mg 1 tablet daily, lovaza 1 gm 2 capsules twice daily. Continue repatha. Continue to work on eating a healthy diet and exercise.  Labs drawn today.

## 2023-01-27 NOTE — Assessment & Plan Note (Signed)
 Remain off  Metoprolol Waiting on Holter monitor when it come through mail

## 2023-01-30 ENCOUNTER — Ambulatory Visit (INDEPENDENT_AMBULATORY_CARE_PROVIDER_SITE_OTHER): Payer: PPO | Admitting: Podiatry

## 2023-01-30 ENCOUNTER — Encounter: Payer: Self-pay | Admitting: Podiatry

## 2023-01-30 DIAGNOSIS — L97411 Non-pressure chronic ulcer of right heel and midfoot limited to breakdown of skin: Secondary | ICD-10-CM | POA: Diagnosis not present

## 2023-01-30 DIAGNOSIS — E08621 Diabetes mellitus due to underlying condition with foot ulcer: Secondary | ICD-10-CM | POA: Diagnosis not present

## 2023-01-30 DIAGNOSIS — E11621 Type 2 diabetes mellitus with foot ulcer: Secondary | ICD-10-CM

## 2023-01-30 DIAGNOSIS — L97522 Non-pressure chronic ulcer of other part of left foot with fat layer exposed: Secondary | ICD-10-CM

## 2023-01-30 DIAGNOSIS — Z89421 Acquired absence of other right toe(s): Secondary | ICD-10-CM

## 2023-01-30 DIAGNOSIS — E1142 Type 2 diabetes mellitus with diabetic polyneuropathy: Secondary | ICD-10-CM

## 2023-01-30 DIAGNOSIS — M2042 Other hammer toe(s) (acquired), left foot: Secondary | ICD-10-CM | POA: Diagnosis not present

## 2023-01-30 NOTE — Progress Notes (Signed)
 Chief Complaint  Patient presents with   Routine Post Op    PO Rt amputation of toes, Left was doc will be doing something to left 4th and 5th.  A1c 6.7 [id number]  Date of surgery: 01/04/2023 Procedure: Right first and third toe amputation with Dr. Malvin  HPI: 53 y.o. female presents today for postoperative visit following right foot first and third toe amputation.  Pain has been well-controlled.  She has had home health assist with right foot dressing changes.  She presents with new ulcerations to the plantar aspect of left fourth toe.  Denies any significant drainage or signs of infection.  Denies any nausea, vomiting, fever, chills, chest pain, shortness of breath.  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 No longer has     Past Surgical History:  Procedure Laterality Date   AMPUTATION TOE Bilateral 03/04/2022   Procedure: AMPUTATION TOE RIGHT FOOT PARTIAL OR TOTAL GREAT TOE AND SECOND TOE, LEFT FOOT PARTIAL OR TOTAL GREAT TOE, SECOND AND THIRD TOE;  Surgeon: Malvin Marsa FALCON, DPM;  Location: [facility] OR;  Service: Podiatry;  Laterality: Bilateral;   AMPUTATION TOE Right 01/04/2023   Procedure: R 1ST TOE AMPUTATION, RIGHT 3RD TOE AMPUTATION;  Surgeon: Malvin Marsa FALCON, DPM;  Location: [facility];  Service: Orthopedics/Podiatry;  Laterality: Right;   APPENDECTOMY     BACK SURGERY     x 3 lower disc   BILATERAL TOTAL MASTECTOMY WITH AXILLARY LYMPH NODE DISSECTION Bilateral 09/2019   CHOLECYSTECTOMY     nephrolithiasis     ROBOTIC ASSISTED TOTAL HYSTERECTOMY WITH BILATERAL SALPINGO OOPHERECTOMY Bilateral 06/14/2016   Procedure: ROBOTIC ASSISTED TOTAL HYSTERECTOMY WITH BILATERAL SALPINGO OOPHORECTOMY;  Surgeon: Dodie Shadow, MD;  Location: [facility];  Service: Gynecology;  Laterality: Bilateral;    Allergies  Allergen Reactions   Codeine Shortness Of Breath and Other (See Comments)    Other reaction(s): SHOB    Augmentin  [Amoxicillin -Pot Clavulanate] Diarrhea   Celebrex [Celecoxib] Other (See Comments)    Unknown reaction   Propranolol Other (See Comments)    Other reaction(s): Unknown   Propranolol Hcl Other (  See Comments)    Unknown reaction  ask   Simvastatin Other (See Comments)    Other reaction(s): Unknown   Vytorin [Ezetimibe-Simvastatin] Other (See Comments)    Unknown reaction  Patient is not aware of an allergy to this medication, ask   Zetia [Ezetimibe] Other (See Comments)    Other reaction(s): Unknown    ROS negative except as stated in HPI   Physical Exam: There were no vitals filed for this visit.  General: The patient is alert and oriented x3 in no acute distress.  Dermatology: Good progression to the right first and second toe amputation sites.  There is still some  superficial dehiscence centrally to the second toe amputation site, first toe medial lateral corners.  All sutures were removed today without complication.  Right midfoot blister healing well.  Two remaining focal areas of skin breakdown remain less than 0.5 x 0.3 cm each  Left fourth and fifth hammertoes there is new wound breakdown to the sulcus of the fourth toe with fat layer exposed, no signs of infection present.   Vascular: Palpable pedal pulses bilaterally. Capillary refill within normal limits.  Forefoot erythema edema to the right, stated above.  Neurological: Protective sensation grossly diminished bilaterally.  Musculoskeletal Exam: Status post right foot first, second, third toe amputation, left foot history of first, second, third amputation with hammertoe contractures of the remaining digits that are reducible.  Radiographic Exam:  Deferred  Assessment/Plan of Care: 1. Diabetic ulcer of toe of left foot associated with diabetes mellitus due to underlying condition, with fat layer exposed (HCC)   2. Diabetic ulcer of right midfoot associated with type 2 diabetes mellitus, limited to breakdown of skin (HCC)   3. Hammertoe of left foot   4. DM type 2 with diabetic peripheral neuropathy (HCC)   5. S/P amputation of lesser toe, right (HCC)      No orders of the defined types were placed in this encounter.  None  Discussed clinical findings with patient today.  Plan: - No signs of acute infection at this point - Right first and second toe amputation site removed today without incident -Right midfoot dressed with Xeroform, amputation sites painted with Betadine and dressed with Telfa nonadherent gauze.  Light talar dressing applied -Some skin breakdown to the fourth toe left foot.  This will need to resolve before proceeding with tenotomy along with completion of healing to the right foot.  This was dressed with Betadine gauze today -Home health orders placed for the left  foot to be dressed with Aquacel Ag and secured in place, right foot Betadine and nonadherent gauze to the amputation sites with Xeroform to the plantar midfoot sites.  Pain 3 times a week. - Advised patient to practice limited stance and gait is much as possible while she recovers, importance of minimizing weightbearing as much as possible was stressed with patient. -Will have patient follow-up in 2 weeks for wound care.    Cuca Benassi L. Lamount MAUL, AACFAS [facility]     [address] [place], [place] [address]                Office [phone]  Fax [phone]

## 2023-02-02 ENCOUNTER — Other Ambulatory Visit: Payer: Self-pay | Admitting: *Deleted

## 2023-02-02 ENCOUNTER — Telehealth: Payer: Self-pay | Admitting: *Deleted

## 2023-02-02 NOTE — Patient Outreach (Signed)
  Care Management   Follow Up Note   02/02/2023 Name: Linda [name] MRN: [id number] DOB: 07-Dec-1970   Referred by: Sherre Clapper, MD Reason for referral : Care Management (RNCM: ATTEMPT #1 Follow Up For Chronic Disease Management & Care Coordination Needs)   An unsuccessful telephone outreach was attempted today. The patient was referred to the case management team for assistance with care management and care coordination.   Follow Up Plan: The care management team will reach out to the patient again over the next 30 days.   Rosina Forte, BSN RN RN Care Manager  [facility]  Direct Number: [phone]

## 2023-02-07 ENCOUNTER — Telehealth: Payer: Self-pay | Admitting: *Deleted

## 2023-02-07 ENCOUNTER — Ambulatory Visit (INDEPENDENT_AMBULATORY_CARE_PROVIDER_SITE_OTHER): Payer: PPO

## 2023-02-07 ENCOUNTER — Ambulatory Visit: Payer: PPO | Admitting: Podiatry

## 2023-02-07 DIAGNOSIS — L97522 Non-pressure chronic ulcer of other part of left foot with fat layer exposed: Secondary | ICD-10-CM

## 2023-02-07 DIAGNOSIS — Z89412 Acquired absence of left great toe: Secondary | ICD-10-CM | POA: Diagnosis not present

## 2023-02-07 DIAGNOSIS — Z89411 Acquired absence of right great toe: Secondary | ICD-10-CM | POA: Diagnosis not present

## 2023-02-07 DIAGNOSIS — M2042 Other hammer toe(s) (acquired), left foot: Secondary | ICD-10-CM

## 2023-02-07 DIAGNOSIS — B353 Tinea pedis: Secondary | ICD-10-CM | POA: Diagnosis not present

## 2023-02-07 DIAGNOSIS — E08621 Diabetes mellitus due to underlying condition with foot ulcer: Secondary | ICD-10-CM | POA: Diagnosis not present

## 2023-02-07 DIAGNOSIS — E1142 Type 2 diabetes mellitus with diabetic polyneuropathy: Secondary | ICD-10-CM

## 2023-02-07 MED ORDER — KETOCONAZOLE 2 % EX GEL: 1.0000 | Freq: Every day | CUTANEOUS | 0 refills | Status: DC

## 2023-02-07 NOTE — Progress Notes (Signed)
 Complex Care Management Care Guide Note  02/07/2023 Name: Tegan Britain MRN: [id number] DOB: 1970/10/04  Reanne Nellums is a 53 y.o. year old female who is a primary care patient of [name], Kirsten, MD and is actively engaged with the care management team. I reached out to Therisa Almarie Gash by phone today to assist with re-scheduling  with the RN Case Manager.  Follow up plan: Telephone appointment with complex care management team member scheduled for:  03/08/23  [facility]  Care Coordination Care Guide  Direct Dial: [phone]

## 2023-02-07 NOTE — Progress Notes (Signed)
 Chief Complaint  Patient presents with   Foot Ulcer    Left 4th toe at the base of bottom of toe. Open sore from medial side of the toe all the way across the bottom.  A1c 6.7 BLEEDER  Date of surgery: 01/04/2023 Procedure: Right first and third toe amputation with Dr. Malvin  HPI: 53 y.o. female presents today for postoperative visit following right foot first and third toe amputation.  Pain has been well-controlled.  She has had home health assist with right foot dressing changes.  Right foot is healing well as the plantar midfoot blister and first and second toe amputation sites appear well-healed.  She does have left fourth toe ulceration which unfortunately home health is unable to assist with.  Denies any nausea, vomiting, fever, chills, chest pain, shortness of breath.  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 2 diabetes mellitus, with fat layer exposed (HCC) 03/27/2020   Gastritis    Genetic susceptibility to malignant neoplasm of breast    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 No longer has    Past Surgical History:  Procedure Laterality Date   AMPUTATION TOE Bilateral 03/04/2022   Procedure: AMPUTATION TOE RIGHT FOOT PARTIAL OR TOTAL GREAT TOE AND SECOND TOE, LEFT FOOT PARTIAL OR TOTAL GREAT TOE, SECOND AND THIRD TOE;  Surgeon: Malvin Marsa FALCON, DPM;  Location: [facility];  Service: Podiatry;  Laterality: Bilateral;   AMPUTATION TOE Right 01/04/2023   Procedure: R 1ST TOE AMPUTATION, RIGHT 3RD TOE AMPUTATION;  Surgeon: Malvin Marsa FALCON, DPM;  Location: [facility];  Service: Orthopedics/Podiatry;  Laterality: Right;   APPENDECTOMY     BACK SURGERY     x 3 lower disc   BILATERAL TOTAL MASTECTOMY WITH AXILLARY LYMPH NODE DISSECTION Bilateral 09/2019   CHOLECYSTECTOMY     nephrolithiasis     ROBOTIC ASSISTED TOTAL HYSTERECTOMY WITH BILATERAL SALPINGO OOPHERECTOMY Bilateral 06/14/2016   Procedure: ROBOTIC ASSISTED TOTAL HYSTERECTOMY WITH BILATERAL SALPINGO OOPHORECTOMY;  Surgeon: Dodie Shadow, MD;  Location: [facility];  Service: Gynecology;  Laterality: Bilateral;    Allergies  Allergen Reactions   Codeine Shortness Of Breath and Other (See Comments)    Other reaction(s): SHOB    Augmentin  [Amoxicillin -Pot Clavulanate] Diarrhea   Celebrex [Celecoxib] Other (See Comments)  Unknown reaction   Propranolol Other (See Comments)    Other reaction(s): Unknown   Propranolol Hcl Other (See Comments)    Unknown reaction  ask   Simvastatin Other (See Comments)    Other reaction(s): Unknown   Vytorin [Ezetimibe-Simvastatin] Other (See Comments)    Unknown reaction  Patient is not aware of an allergy to this medication, ask   Zetia [Ezetimibe] Other (See Comments)    Other reaction(s): Unknown    ROS negative except as stated in HPI   Physical Exam: There were no vitals filed for this visit.  General: The patient is alert and oriented x3 in no acute  distress.  Dermatology: Site of right midfoot blister and right first and second toe amputation sites appear well-healed at this point.  Left fourth toe ulceration to sulcus appears relatively unchanged, there is some macerated periwound tissue without significant drainage, significant erythema, edema.  No signs of acute bacterial infection.   Vascular: Palpable pedal pulses bilaterally. Capillary refill within normal limits.  Forefoot erythema edema to the right, stated above.  Neurological: Protective sensation grossly diminished bilaterally.  Musculoskeletal Exam: Status post right foot first, second, third toe amputation, left foot history of first, second, third amputation with hammertoe contractures of the remaining digits that are reducible.  Radiographic Exam: 02/07/23 Left foot normal osseous mineralization.  Prior first, second, third toe amputations.  Hammertoe contractures of the fourth and fifth toe.  No osteolysis or cortical erosions appreciated.  No soft tissue emphysema.  Assessment/Plan of Care: 1. Hammertoe of left foot   2. Diabetic ulcer of toe of left foot associated with diabetes mellitus due to underlying condition, with fat layer exposed (HCC)   3. Tinea pedis of left foot   4. DM type 2 with diabetic peripheral neuropathy (HCC)   5. 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    Dispense:  45 g    Refill:  0   None  Discussed clinical findings with patient today.  Plan: -Right foot appears well-healed.  No need for further wound care.  She may begin to get this area wet at this point -Left fourth toe dressed with Betadine gauze today.  There is some maceration around this area and some concern for developing fungal infection here.  Ketoconazole  cream prescribed to the patient.  Monitor the site closely. - Advised patient to practice  limited stance and gait is much as possible while she recovers, importance of minimizing weightbearing as much as possible was stressed with patient.  She may return to regular shoe gear right foot. -Patient would benefit from diabetic shoes due to neuropathy and history of toe amputations.  Order placed. -Will have patient follow-up in 2 weeks for wound care.   Aileena Iglesia L. Lamount MAUL, AACFAS [facility]     [address] [place], [place] [address]                Office [phone]  Fax [phone]

## 2023-02-09 ENCOUNTER — Encounter: Payer: Self-pay | Admitting: Podiatry

## 2023-02-13 ENCOUNTER — Telehealth: Payer: Self-pay | Admitting: Podiatry

## 2023-02-13 NOTE — Telephone Encounter (Signed)
Pts nurse called to let doctor know that she had a fall yesterday, no injuries.

## 2023-02-14 ENCOUNTER — Other Ambulatory Visit: Payer: Self-pay | Admitting: Family Medicine

## 2023-02-14 ENCOUNTER — Encounter: Payer: Self-pay | Admitting: Podiatry

## 2023-02-14 ENCOUNTER — Ambulatory Visit: Payer: PPO | Admitting: Podiatry

## 2023-02-14 ENCOUNTER — Other Ambulatory Visit: Payer: Self-pay

## 2023-02-14 DIAGNOSIS — E1140 Type 2 diabetes mellitus with diabetic neuropathy, unspecified: Secondary | ICD-10-CM

## 2023-02-14 DIAGNOSIS — M2042 Other hammer toe(s) (acquired), left foot: Secondary | ICD-10-CM

## 2023-02-14 DIAGNOSIS — K7460 Unspecified cirrhosis of liver: Secondary | ICD-10-CM

## 2023-02-14 DIAGNOSIS — F50819 Binge eating disorder, unspecified: Secondary | ICD-10-CM

## 2023-02-14 DIAGNOSIS — E08621 Diabetes mellitus due to underlying condition with foot ulcer: Secondary | ICD-10-CM

## 2023-02-14 DIAGNOSIS — L97522 Non-pressure chronic ulcer of other part of left foot with fat layer exposed: Secondary | ICD-10-CM

## 2023-02-14 NOTE — Progress Notes (Unsigned)
Chief Complaint  Patient presents with   Hammer Toe    Lft 4th, is healing but cannot tell if it is healed enough for the tenotomy.   Date of surgery: 01/04/2023 Procedure: Right first and third toe amputation with Dr. Annamary Rummage  HPI: 53 y.o. female following up for left fourth toe wound associated with fourth and fifth toe hammertoe.  She has recovered well from right foot first and third toe amputations.  Right midfoot wound is healed.  Patient states that she never received the ketoconazole however she has been applying Betadine wet-to-dry gauze to the left fourth toe wound and believes that have been improving somewhat.  Denies any nausea, vomiting, fever, chills, chest pain, shortness of breath.  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 2 diabetes mellitus, with fat layer exposed (HCC) 03/27/2020   Gastritis    Genetic susceptibility to malignant neoplasm of breast    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 No longer has    Past Surgical History:  Procedure Laterality Date   AMPUTATION TOE Bilateral 03/04/2022   Procedure: AMPUTATION TOE RIGHT FOOT PARTIAL OR TOTAL GREAT TOE AND SECOND TOE, LEFT FOOT PARTIAL OR TOTAL GREAT TOE, SECOND AND THIRD TOE;  Surgeon: Pilar Plate, DPM;  Location: [facility];  Service: Podiatry;  Laterality: Bilateral;   AMPUTATION TOE Right 01/04/2023   Procedure: R 1ST TOE AMPUTATION, RIGHT 3RD TOE AMPUTATION;  Surgeon: Pilar Plate, DPM;  Location: [facility];  Service: Orthopedics/Podiatry;  Laterality: Right;   APPENDECTOMY     BACK SURGERY     x 3 lower disc   BILATERAL TOTAL MASTECTOMY WITH AXILLARY LYMPH NODE DISSECTION Bilateral 09/2019   CHOLECYSTECTOMY     nephrolithiasis     ROBOTIC ASSISTED TOTAL HYSTERECTOMY WITH BILATERAL SALPINGO OOPHERECTOMY Bilateral 06/14/2016   Procedure: ROBOTIC ASSISTED TOTAL HYSTERECTOMY WITH BILATERAL SALPINGO OOPHORECTOMY;  Surgeon: Cleda Mccreedy, MD;  Location: [facility];  Service: Gynecology;  Laterality: Bilateral;    Allergies  Allergen Reactions   Codeine Shortness Of Breath and Other (See Comments)    Other reaction(s): SHOB    Augmentin [Amoxicillin-Pot Clavulanate] Diarrhea   Celebrex [Celecoxib] Other (See Comments)    Unknown reaction   Propranolol Other (See Comments)    Other reaction(s): Unknown  Propranolol Hcl Other (See Comments)    Unknown reaction  ask   Simvastatin Other (See Comments)    Other reaction(s): Unknown   Vytorin [Ezetimibe-Simvastatin] Other (See Comments)    Unknown reaction  Patient is not aware of an allergy to this medication, ask   Zetia [Ezetimibe] Other (See Comments)    Other reaction(s): Unknown    ROS negative except as stated in HPI   Physical Exam: There were no vitals filed for this visit.  General: The patient is alert and oriented x3 in no acute distress.  Dermatology: Site of right midfoot blister and right first and second toe  amputation sites appear well-healed at this point.  Left fourth toe ulceration to sulcus appears mildly unchanged, there is some macerated periwound tissue without significant drainage, significant erythema, edema.  No signs of acute bacterial infection. Wound margins contracting. Tissue to wound bed appears viable.  Vascular: Palpable pedal pulses bilaterally. Capillary refill within normal limits.  Forefoot erythema edema to the right, stated above.  Neurological: Protective sensation grossly diminished bilaterally.  Musculoskeletal Exam: Status post right foot first, second, third toe amputation, left foot history of first, second, third amputation with hammertoe contractures of the remaining digits that are reducible.  Radiographic Exam: 02/07/23 Left foot normal osseous mineralization.  Prior first, second, third toe amputations.  Hammertoe contractures of the fourth and fifth toe.  No osteolysis or cortical erosions appreciated.  No soft tissue emphysema.  Assessment/Plan of Care: 1. Diabetic ulcer of toe of left foot associated with diabetes mellitus due to underlying condition, with fat layer exposed (HCC)   2. Hammertoe of left foot      No orders of the defined types were placed in this encounter.  None  Discussed clinical findings with patient today.  Plan: -Betadine wet-to-dry gauze dressing applied to the left foot fourth toe ulcer -Patient to change dressing daily - Advised patient to practice limited stance and gait is much as possible while she recovers, importance of minimizing weightbearing as much as possible was stressed with patient.  - Fourth toe wound does appear to be improving.  Planning on flexor tenotomy once healed as she was developing preulcerative calluses to the tip of the fourth toe with regular weightbearing. -Will have patient follow-up in 2 weeks for wound care, possible tenotomy   Jkwon Treptow L. Marchia Bond, AACFAS [facility]     [address] [place], [place] [address]                Office [phone]  Fax [phone]

## 2023-02-25 DIAGNOSIS — R000 Tachycardia, unspecified: Secondary | ICD-10-CM | POA: Diagnosis not present

## 2023-02-27 ENCOUNTER — Ambulatory Visit: Payer: PPO | Admitting: Podiatry

## 2023-02-27 ENCOUNTER — Encounter: Payer: Self-pay | Admitting: Family Medicine

## 2023-03-01 ENCOUNTER — Other Ambulatory Visit: Payer: Self-pay

## 2023-03-01 ENCOUNTER — Other Ambulatory Visit: Payer: Self-pay | Admitting: Family Medicine

## 2023-03-01 DIAGNOSIS — E1140 Type 2 diabetes mellitus with diabetic neuropathy, unspecified: Secondary | ICD-10-CM

## 2023-03-07 ENCOUNTER — Telehealth: Payer: Self-pay

## 2023-03-07 NOTE — Telephone Encounter (Signed)
[name], RN w/[facility], called and left a message. The callus under the patient's 4th toe is now an open ulcer. Asking if she can treat it with MediHoney and calcium alginate. Please advise

## 2023-03-08 ENCOUNTER — Encounter: Payer: Self-pay | Admitting: Family Medicine

## 2023-03-08 ENCOUNTER — Other Ambulatory Visit: Payer: Self-pay | Admitting: Family Medicine

## 2023-03-08 ENCOUNTER — Other Ambulatory Visit: Payer: Self-pay | Admitting: *Deleted

## 2023-03-08 NOTE — Telephone Encounter (Signed)
Spoke with [name], oked the medihoney.

## 2023-03-08 NOTE — Patient Outreach (Signed)
Care Management   Visit Note  03/08/2023 Name: Linda [name] MRN: [id number] DOB: [birth year]  Subjective: Linda [name] is a 53 y.o. year old female who is a primary care patient of [name], Kirsten, MD. The Care Management team was consulted for assistance.      Engaged with patient spoke with patient by telephone.    Goals Addressed             This Visit's Progress    RNCM Care Management Expected Outcome:  Monitor, Self-Manage and Reduce Symptoms of Diabetes       Current Barriers:  Knowledge Deficits related to concerns about places on her toes and not being able to be seen by podiatrist until next week, the patient expressed concern over being diabetic and not wanting to lose her toes Care Coordination needs related to resources in the area to help with her expressed needs and caring for her self due to lack of support system in a patient with DM Chronic Disease Management support and education needs related to effective management of DM Lacks caregiver support.  Lab Results  Component Value Date   HGBA1C 6.7 (H) 12/21/2022     Planned Interventions: Provided education to patient about basic DM disease process. Verbalizes understanding of her diabetes as it relates to proper wound healing.  Reviewed medications with patient and discussed importance of medication adherence. The patient is compliant with her medications. Denies any acute changes in her medications. ;        Reviewed prescribed diet with patient heart healthy/ADA diet. Is compliant with her diet; Counseled on importance of regular laboratory monitoring as prescribed. Has regular labwork ;        Discussed plans with patient for ongoing care management follow up and provided patient with direct contact information for care management team;      Provided patient with written educational materials related to hypo and hyperglycemia and importance of correct treatment. The lowest the patient has seen is  114 and the highest is 260s; No fasting number reported. She states she was most likely within range due to not receiving an alarm. She states typically she ranges from 100's.      Reviewed scheduled/upcoming provider appointments including: 05-03-23 with PCP.       Advised patient, providing education and rationale, to check cbg when you have symptoms of low or high blood sugar and has a continuous glucose reader, freestyle Libre 3 and record.  Referral made to pharmacy team for assistance with ongoing support and education for medication needs ;       Referral made to social work team for assistance with ongoing support and education for effective management of stress, anxiety, depression ;      Referral made to community resources care guide team for assistance with food resources and community resources;      Review of patient status, including review of consultants reports, relevant laboratory and other test results, and medications completed;       Advised patient to discuss changes in her DM and questions and concerns with provider;      Screening for signs and symptoms of depression related to chronic disease state;        Assessed social determinant of health barriers;        The patient states she received her therapeutic shoe but it was soiled with blood and she has started to wear her diabetic shoes Will need to schedule eye appointment for 2025. Recently  diagnosed with shingles and all sites have healed at this time.  Symptom Management: Take medications as prescribed   Attend all scheduled provider appointments Call provider office for new concerns or questions  call the Suicide and Crisis Lifeline: 988 call the [place] National Suicide Prevention Lifeline: 346 180 9745 or TTY: 854-789-3342 TTY (709)380-9698) to talk to a trained counselor call 1-800-273-TALK (toll free, 24 hour hotline) if experiencing a Mental Health or Behavioral Health Crisis  keep appointment with eye  doctor check feet daily for cuts, sores or redness trim toenails straight across manage portion size wash and dry feet carefully every day wear comfortable, cotton socks wear comfortable, well-fitting shoes  Follow Up Plan: Telephone follow up appointment with care management team member scheduled for: 04-07-2023 at 2:30 pm       RNCM Care Management Expected Outcome:  Monitor, Self-Manage and Reduce Symptoms of HLD       Current Barriers:  Chronic Disease Management support and education needs related to effective management of HLD Lab Results  Component Value Date   CHOL 156 01/24/2023   HDL 43 01/24/2023   LDLCALC 84 01/24/2023   TRIG 169 (H) 01/24/2023   CHOLHDL 3.6 01/24/2023     Planned Interventions: Provider established cholesterol goals reviewed. Review of labs. Triglycerides increased since last check. Patient reports that when she binge eats its mostly foods that are high in fat. Education and support given. ; Counseled on importance of regular laboratory monitoring as prescribed. ; Provided HLD educational materials; Reviewed role and benefits of statin for ASCVD risk reduction. Compliance with all medications Discussed strategies to manage statin-induced myalgias. Denies myalgias. Reviewed importance of limiting foods high in cholesterol. Education and support provided on a heart healthy/ADA diet. The patient tries to watch what she is eating.  Screening for signs and symptoms of depression related to chronic disease state;  Assessed social determinant of health barriers;   Symptom Management: Take medications as prescribed   Attend all scheduled provider appointments Call provider office for new concerns or questions  call the Suicide and Crisis Lifeline: 988 call the [place] National Suicide Prevention Lifeline: 781-546-1875 or TTY: (401)337-5538 TTY (757)676-5645) to talk to a trained counselor call 1-800-273-TALK (toll free, 24 hour hotline) if experiencing a  Mental Health or Behavioral Health Crisis  - take all medications exactly as prescribed - call doctor with any symptoms you believe are related to your medicine - call doctor when you experience any new symptoms - go to all doctor appointments as scheduled - adhere to prescribed diet: heart healthy/ADA diet   Follow Up Plan: Telephone follow up appointment with care management team member scheduled for: 04-07-2023 at 2:30 pm       RNCM Care Management Expected Outcome:  Monitor, Self-Manage and Reduce Symptoms of: Depression       Current Barriers:  Knowledge Deficits related to how to manage emotions and stress level in a patient with multiple chronic conditions including depression Care Coordination needs related to resources in the community for food insecurities and additional support for social worker support  in a patient with depression and increased stress level Chronic Disease Management support and education needs related to effective management of depression  Lacks caregiver support.   Planned Interventions: Evaluation of current treatment plan related to depression  and patient's adherence to plan as established by provider. The patient states she has good days and bad days. Reports that she feels down more than not up and she is open to talk  with LCSW for counseling. Advised patient to call the office for changes in mood, anxiety, depression, stress level, or mental health needs  Provided education to patient re: doing mindfulness activities, journal writing, creative ways to help her when she is stressed out.  Reviewed medications with patient and discussed compliance. States she is compliant with medications Collaborated with LCSW and scheduling care guides regarding the need for follow up by LCSW. The LCSW has worked with the patient before for depression and other needs. She is working with the SW for additional and ongoing support.  Provided patient with resources and support  for effective management of depression  educational materials related to managing her stress level and depression  Reviewed scheduled/upcoming provider appointments including 05-03-23 with PCP Care Guide referral for food insecurities and scheduling follow up with LCSW Social Work referral for support and education needs related to depression and stress. Ongoing support and education from the LCSW.  Discussed plans with patient for ongoing care management follow up and provided patient with direct contact information for care management team Advised patient to discuss changes in her depression and anxiety level, questions and concerns on how to best manage her chronic care needs due to limited support system with provider Screening for signs and symptoms of depression related to chronic disease state  Assessed social determinant of health barriers    Symptom Management: Take medications as prescribed   Attend all scheduled provider appointments Call provider office for new concerns or questions  call the Suicide and Crisis Lifeline: 988 call the [place] National Suicide Prevention Lifeline: (786)837-5035 or TTY: (804)842-3631 TTY 928 797 1095) to talk to a trained counselor call 1-800-273-TALK (toll free, 24 hour hotline) if experiencing a Mental Health or Behavioral Health Crisis   Follow Up Plan: Telephone follow up appointment with care management team member scheduled for: 04-07-2023 at 2:30 pm       RNCM Care Management Expected Outcome:  Monitor, Self-Manage, and Reduce Symptoms of Hypertension       Current Barriers:  Chronic Disease Management support and education needs related to effective management of HTN Lacks caregiver support.   BP Readings from Last 3 Encounters:  01/24/23 104/60  01/07/23 123/77  01/05/23 (!) 110/53    Planned Interventions: Evaluation of current treatment plan related to hypertension self management and patient's adherence to plan as established by  provider.  She denies any light headedness or dizziness. Regularly checks blood pressure and states that her DBP stays in the 50s most day. She reports that she feels dehydrated and she has noticed an increase in dry mouth. She states home health checked her BP yesterday 121/62. She denies any chest pain dizziness or headaches at this time. RNCM sent provider a message about patient complianing of cramps in her legs bilateral and they were sore. Provided education to patient re: stroke prevention, s/s of heart attack and stroke; education and support provided Reviewed prescribed diet heart  healthy/ADA diet.  Reviewed medications with patient and discussed importance of compliance. Reports compliance with her medications Discussed plans with patient for ongoing care management follow up and provided patient with direct contact information for care management team; Advised patient, providing education and rationale, to monitor blood pressure daily and record, calling PCP for findings outside established parameters;  Reviewed scheduled/upcoming provider appointments including: 05-03-23 with PCP Advised patient to discuss changes in blood pressures and heart health with provider; Provided education on prescribed diet heart healthy/ADA diet ;  Discussed complications of poorly controlled blood  pressure such as heart disease, stroke, circulatory complications, vision complications, kidney impairment, sexual dysfunction;  Screening for signs and symptoms of depression related to chronic disease state;  Assessed social determinant of health barriers;   Symptom Management: Take medications as prescribed   Attend all scheduled provider appointments Call provider office for new concerns or questions  call the Suicide and Crisis Lifeline: 988 call the [place] National Suicide Prevention Lifeline: (626)182-2112 or TTY: 959-261-0783 TTY 856-629-0844) to talk to a trained counselor call 1-800-273-TALK (toll free,  24 hour hotline) if experiencing a Mental Health or Behavioral Health Crisis  check blood pressure weekly learn about high blood pressure call doctor for signs and symptoms of high blood pressure develop an action plan for high blood pressure keep all doctor appointments take medications for blood pressure exactly as prescribed report new symptoms to your doctor  Follow Up Plan: Telephone follow up appointment with care management team member scheduled for: 04-07-2023 at 2:30 pm           Consent to Services:  Patient was given information about care management services, agreed to services, and gave verbal consent to participate.   Plan: Telephone follow up appointment with care management team member scheduled for:04-07-2023 at 2:30 pm  Larey Brick, BSN RN [facility], [facility] RN Care Manager Direct Dial: [phone]  Fax: [phone]

## 2023-03-08 NOTE — Patient Instructions (Signed)
Visit Information  Thank you for taking time to visit with me today. Please don't hesitate to contact me if I can be of assistance to you before our next scheduled telephone appointment.  Following are the goals we discussed today:   Goals Addressed             This Visit's Progress    RNCM Care Management Expected Outcome:  Monitor, Self-Manage and Reduce Symptoms of Diabetes       Current Barriers:  Knowledge Deficits related to concerns about places on her toes and not being able to be seen by podiatrist until next week, the patient expressed concern over being diabetic and not wanting to lose her toes Care Coordination needs related to resources in the area to help with her expressed needs and caring for her self due to lack of support system in a patient with DM Chronic Disease Management support and education needs related to effective management of DM Lacks caregiver support.  Lab Results  Component Value Date   HGBA1C 6.7 (H) 12/21/2022     Planned Interventions: Provided education to patient about basic DM disease process. Verbalizes understanding of her diabetes as it relates to proper wound healing.  Reviewed medications with patient and discussed importance of medication adherence. The patient is compliant with her medications. Denies any acute changes in her medications. ;        Reviewed prescribed diet with patient heart healthy/ADA diet. Is compliant with her diet; Counseled on importance of regular laboratory monitoring as prescribed. Has regular labwork ;        Discussed plans with patient for ongoing care management follow up and provided patient with direct contact information for care management team;      Provided patient with written educational materials related to hypo and hyperglycemia and importance of correct treatment. The lowest the patient has seen is 114 and the highest is 260s; No fasting number reported. She states she was most likely within range due  to not receiving an alarm. She states typically she ranges from 100's.      Reviewed scheduled/upcoming provider appointments including: 05-03-23 with PCP.       Advised patient, providing education and rationale, to check cbg when you have symptoms of low or high blood sugar and has a continuous glucose reader, freestyle Libre 3 and record.  Referral made to pharmacy team for assistance with ongoing support and education for medication needs ;       Referral made to social work team for assistance with ongoing support and education for effective management of stress, anxiety, depression ;      Referral made to community resources care guide team for assistance with food resources and community resources;      Review of patient status, including review of consultants reports, relevant laboratory and other test results, and medications completed;       Advised patient to discuss changes in her DM and questions and concerns with provider;      Screening for signs and symptoms of depression related to chronic disease state;        Assessed social determinant of health barriers;        The patient states she received her therapeutic shoe but it was soiled with blood and she has started to wear her diabetic shoes Will need to schedule eye appointment for 2025. Recently diagnosed with shingles and all sites have healed at this time.  Symptom Management: Take medications as prescribed  Attend all scheduled provider appointments Call provider office for new concerns or questions  call the Suicide and Crisis Lifeline: 988 call the [place] National Suicide Prevention Lifeline: (502)082-0148 or TTY: 7241622303 TTY 518-426-1008) to talk to a trained counselor call 1-800-273-TALK (toll free, 24 hour hotline) if experiencing a Mental Health or Behavioral Health Crisis  keep appointment with eye doctor check feet daily for cuts, sores or redness trim toenails straight across manage portion size wash and  dry feet carefully every day wear comfortable, cotton socks wear comfortable, well-fitting shoes  Follow Up Plan: Telephone follow up appointment with care management team member scheduled for: 04-07-2023 at 2:30 pm       RNCM Care Management Expected Outcome:  Monitor, Self-Manage and Reduce Symptoms of HLD       Current Barriers:  Chronic Disease Management support and education needs related to effective management of HLD Lab Results  Component Value Date   CHOL 156 01/24/2023   HDL 43 01/24/2023   LDLCALC 84 01/24/2023   TRIG 169 (H) 01/24/2023   CHOLHDL 3.6 01/24/2023     Planned Interventions: Provider established cholesterol goals reviewed. Review of labs. Triglycerides increased since last check. Patient reports that when she binge eats its mostly foods that are high in fat. Education and support given. ; Counseled on importance of regular laboratory monitoring as prescribed. ; Provided HLD educational materials; Reviewed role and benefits of statin for ASCVD risk reduction. Compliance with all medications Discussed strategies to manage statin-induced myalgias. Denies myalgias. Reviewed importance of limiting foods high in cholesterol. Education and support provided on a heart healthy/ADA diet. The patient tries to watch what she is eating.  Screening for signs and symptoms of depression related to chronic disease state;  Assessed social determinant of health barriers;   Symptom Management: Take medications as prescribed   Attend all scheduled provider appointments Call provider office for new concerns or questions  call the Suicide and Crisis Lifeline: 988 call the [place] National Suicide Prevention Lifeline: (323) 475-0530 or TTY: 916-103-5562 TTY 712-276-8100) to talk to a trained counselor call 1-800-273-TALK (toll free, 24 hour hotline) if experiencing a Mental Health or Behavioral Health Crisis  - take all medications exactly as prescribed - call doctor with any  symptoms you believe are related to your medicine - call doctor when you experience any new symptoms - go to all doctor appointments as scheduled - adhere to prescribed diet: heart healthy/ADA diet   Follow Up Plan: Telephone follow up appointment with care management team member scheduled for: 04-07-2023 at 2:30 pm       RNCM Care Management Expected Outcome:  Monitor, Self-Manage and Reduce Symptoms of: Depression       Current Barriers:  Knowledge Deficits related to how to manage emotions and stress level in a patient with multiple chronic conditions including depression Care Coordination needs related to resources in the community for food insecurities and additional support for social worker support  in a patient with depression and increased stress level Chronic Disease Management support and education needs related to effective management of depression  Lacks caregiver support.   Planned Interventions: Evaluation of current treatment plan related to depression  and patient's adherence to plan as established by provider. The patient states she has good days and bad days. Reports that she feels down more than not up and she is open to talk with LCSW for counseling. Advised patient to call the office for changes in mood, anxiety, depression, stress level, or mental  health needs  Provided education to patient re: doing mindfulness activities, journal writing, creative ways to help her when she is stressed out.  Reviewed medications with patient and discussed compliance. States she is compliant with medications Collaborated with LCSW and scheduling care guides regarding the need for follow up by LCSW. The LCSW has worked with the patient before for depression and other needs. She is working with the SW for additional and ongoing support.  Provided patient with resources and support for effective management of depression  educational materials related to managing her stress level and depression   Reviewed scheduled/upcoming provider appointments including 05-03-23 with PCP Care Guide referral for food insecurities and scheduling follow up with LCSW Social Work referral for support and education needs related to depression and stress. Ongoing support and education from the LCSW.  Discussed plans with patient for ongoing care management follow up and provided patient with direct contact information for care management team Advised patient to discuss changes in her depression and anxiety level, questions and concerns on how to best manage her chronic care needs due to limited support system with provider Screening for signs and symptoms of depression related to chronic disease state  Assessed social determinant of health barriers    Symptom Management: Take medications as prescribed   Attend all scheduled provider appointments Call provider office for new concerns or questions  call the Suicide and Crisis Lifeline: 988 call the [place] National Suicide Prevention Lifeline: 636-134-0003 or TTY: (607) 785-4935 TTY (386)060-0748) to talk to a trained counselor call 1-800-273-TALK (toll free, 24 hour hotline) if experiencing a Mental Health or Behavioral Health Crisis   Follow Up Plan: Telephone follow up appointment with care management team member scheduled for: 04-07-2023 at 2:30 pm       RNCM Care Management Expected Outcome:  Monitor, Self-Manage, and Reduce Symptoms of Hypertension       Current Barriers:  Chronic Disease Management support and education needs related to effective management of HTN Lacks caregiver support.   BP Readings from Last 3 Encounters:  01/24/23 104/60  01/07/23 123/77  01/05/23 (!) 110/53    Planned Interventions: Evaluation of current treatment plan related to hypertension self management and patient's adherence to plan as established by provider.  She denies any light headedness or dizziness. Regularly checks blood pressure and states that her DBP stays  in the 50s most day. She reports that she feels dehydrated and she has noticed an increase in dry mouth. She states home health checked her BP yesterday 121/62. She denies any chest pain dizziness or headaches at this time. RNCM sent provider a message about patient complianing of cramps in her legs bilateral and they were sore. Provided education to patient re: stroke prevention, s/s of heart attack and stroke; education and support provided Reviewed prescribed diet heart  healthy/ADA diet.  Reviewed medications with patient and discussed importance of compliance. Reports compliance with her medications Discussed plans with patient for ongoing care management follow up and provided patient with direct contact information for care management team; Advised patient, providing education and rationale, to monitor blood pressure daily and record, calling PCP for findings outside established parameters;  Reviewed scheduled/upcoming provider appointments including: 05-03-23 with PCP Advised patient to discuss changes in blood pressures and heart health with provider; Provided education on prescribed diet heart healthy/ADA diet ;  Discussed complications of poorly controlled blood pressure such as heart disease, stroke, circulatory complications, vision complications, kidney impairment, sexual dysfunction;  Screening for signs and symptoms  of depression related to chronic disease state;  Assessed social determinant of health barriers;   Symptom Management: Take medications as prescribed   Attend all scheduled provider appointments Call provider office for new concerns or questions  call the Suicide and Crisis Lifeline: 988 call the [place] National Suicide Prevention Lifeline: 620-485-7347 or TTY: 323-043-6973 TTY (253)834-0966) to talk to a trained counselor call 1-800-273-TALK (toll free, 24 hour hotline) if experiencing a Mental Health or Behavioral Health Crisis  check blood pressure weekly learn about  high blood pressure call doctor for signs and symptoms of high blood pressure develop an action plan for high blood pressure keep all doctor appointments take medications for blood pressure exactly as prescribed report new symptoms to your doctor  Follow Up Plan: Telephone follow up appointment with care management team member scheduled for: 04-07-2023 at 2:30 pm           Our next appointment is by telephone on 04-07-2023 at 2:30 pm  Please call the care guide team at [phone] if you need to cancel or reschedule your appointment.   If you are experiencing a Mental Health or Behavioral Health Crisis or need someone to talk to, please call the Suicide and Crisis Lifeline: 988 call the [place] National Suicide Prevention Lifeline: 4092105454 or TTY: 939-649-3087 TTY 603-286-1139) to talk to a trained counselor call 1-800-273-TALK (toll free, 24 hour hotline)   Patient verbalizes understanding of instructions and care plan provided today and agrees to view in MyChart. Active MyChart status and patient understanding of how to access instructions and care plan via MyChart confirmed with patient.     Telephone follow up appointment with care management team member scheduled for:04-07-2023 at 2:30 pm Larey Brick, BSN RN [facility], [facility] RN Care Manager Direct Dial: [phone]  Fax: [phone]

## 2023-03-13 ENCOUNTER — Telehealth: Payer: Self-pay | Admitting: Podiatry

## 2023-03-13 ENCOUNTER — Ambulatory Visit: Payer: PPO | Admitting: Podiatry

## 2023-03-13 ENCOUNTER — Encounter: Payer: Self-pay | Admitting: Podiatry

## 2023-03-13 DIAGNOSIS — E11621 Type 2 diabetes mellitus with foot ulcer: Secondary | ICD-10-CM

## 2023-03-13 DIAGNOSIS — L97522 Non-pressure chronic ulcer of other part of left foot with fat layer exposed: Secondary | ICD-10-CM | POA: Diagnosis not present

## 2023-03-13 DIAGNOSIS — E1142 Type 2 diabetes mellitus with diabetic polyneuropathy: Secondary | ICD-10-CM

## 2023-03-13 NOTE — Progress Notes (Unsigned)
Chief Complaint  Patient presents with   Wound Check    Right, looks great, there is still some flaky skin at the arch. Left foot, 3rd toe is still open on the under side of the toe. She is able to wear her diabetic shoes now and hope you will allow her to do so. Last A1c 6.7 in Nov.  No anti coag.   Date of surgery: 01/04/2023 Procedure: Right first and third toe amputation with Dr. Annamary Rummage  HPI: 53 y.o. female following up for left fourth toe wound associated with fourth and fifth toe hammertoe.  She has been keeping up with wound care and has had some home health assistance.  Denies any nausea, vomiting, fever, chills, chest pain, shortness of breath.  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 No longer has    Past  Surgical History:  Procedure Laterality Date   AMPUTATION TOE Bilateral 03/04/2022   Procedure: AMPUTATION TOE RIGHT FOOT PARTIAL OR TOTAL GREAT TOE AND SECOND TOE, LEFT FOOT PARTIAL OR TOTAL GREAT TOE, SECOND AND THIRD TOE;  Surgeon: Pilar Plate, DPM;  Location: [facility] OR;  Service: Podiatry;  Laterality: Bilateral;   AMPUTATION TOE Right 01/04/2023   Procedure: R 1ST TOE AMPUTATION, RIGHT 3RD TOE AMPUTATION;  Surgeon: Pilar Plate, DPM;  Location: [facility];  Service: Orthopedics/Podiatry;  Laterality: Right;   APPENDECTOMY     BACK SURGERY     x 3 lower disc   BILATERAL TOTAL MASTECTOMY WITH AXILLARY LYMPH NODE DISSECTION Bilateral 09/2019   CHOLECYSTECTOMY     nephrolithiasis     ROBOTIC ASSISTED TOTAL HYSTERECTOMY WITH BILATERAL SALPINGO OOPHERECTOMY Bilateral 06/14/2016   Procedure: ROBOTIC ASSISTED TOTAL HYSTERECTOMY WITH BILATERAL SALPINGO OOPHORECTOMY;  Surgeon: Cleda Mccreedy, MD;  Location: [facility];  Service: Gynecology;  Laterality: Bilateral;    Allergies  Allergen Reactions   Codeine Shortness Of Breath and Other (See Comments)    Other reaction(s): SHOB    Augmentin [Amoxicillin-Pot Clavulanate] Diarrhea   Celebrex [Celecoxib] Other (See Comments)    Unknown reaction   Propranolol Other (See Comments)  Other reaction(s): Unknown   Propranolol Hcl Other (See Comments)    Unknown reaction  ask   Simvastatin Other (See Comments)    Other reaction(s): Unknown   Vytorin [Ezetimibe-Simvastatin] Other (See Comments)    Unknown reaction  Patient is not aware of an allergy to this medication, ask   Zetia [Ezetimibe] Other (See Comments)    Other reaction(s): Unknown    ROS negative except as stated in HPI   Physical Exam: There were no vitals filed for this visit.  General: The patient is alert and oriented x3 in no acute distress.  Dermatology: Left fourth toe plantar sulcus ulceration measuring 1.5 x 0.5 x 0.2 cm with fibrogranular base, maceration  to wound edges.  No erythema to the toe.  No drainage.  Postdebridement measurements 1.7 x 0.6 x 0.3 cm with healthy bleeding base, bleeding wound edges.  Vascular: Palpable pedal pulses bilaterally. Capillary refill within normal limits.  Forefoot erythema edema to the right, stated above.  Neurological: Protective sensation grossly diminished bilaterally.  Musculoskeletal Exam: Status post right foot first, second, third toe amputation, left foot history of first, second, third amputation with hammertoe contractures of the remaining digits that are partially reducible.   Assessment/Plan of Care: 1. DM type 2 with diabetic peripheral neuropathy (HCC)   2. 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  None  Discussed clinical findings with patient today.  Plan: - Verbal consent obtained perform excisional debridement using #15 blade as described above.  Pre and postdebridement measurements as described above.  No anesthesia required secondary to neuropathy.  Hemostasis achieved with compression. - Aquacel Ag dressing applied.  Patient may change this every 2 to 3 days.  Remaining dressing dispensed to patient. - Advised patient to practice limited stance and gait is much as possible while she recovers, importance of minimizing weightbearing as much as possible was stressed with patient.  -Follow-up in 3 weeks for wound care.   Adarryl Goldammer L. Marchia Bond, AACFAS [facility]     [address] [place], [place] [address]                Office [phone]  Fax [phone]

## 2023-03-14 ENCOUNTER — Other Ambulatory Visit: Payer: Self-pay

## 2023-03-14 ENCOUNTER — Other Ambulatory Visit: Payer: Self-pay | Admitting: Family Medicine

## 2023-03-14 DIAGNOSIS — K7460 Unspecified cirrhosis of liver: Secondary | ICD-10-CM

## 2023-03-14 DIAGNOSIS — F50819 Binge eating disorder, unspecified: Secondary | ICD-10-CM

## 2023-03-14 DIAGNOSIS — E1140 Type 2 diabetes mellitus with diabetic neuropathy, unspecified: Secondary | ICD-10-CM

## 2023-03-14 NOTE — Telephone Encounter (Signed)
Home health is sending over orders on pt to be signed.

## 2023-03-21 ENCOUNTER — Encounter: Payer: Self-pay | Admitting: Oncology

## 2023-03-21 NOTE — Progress Notes (Incomplete)
 [facility]  [address] 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 She was treated with bilateral mastectomy due to BRCA1 mutation.  She completed 4 cycles of Adriamycin/cyclophosphamide chemotherapy and received 8 out of 12 planned weeks of weekly paclitaxel.  She remains without evidence of recurrence.   BRCA1 positive Positive BRCA 1 mutation, which increases her risk for breast and ovarian cancer, as well as elevates her risk for pancreatic cancer.  She underwent hysterectomy/bilateral salpingo-oophorectomy.  The hepatologist recommended biannual screening for hepatocellular carcinoma with ultrasound alternating with cross-sectional imaging to allow screening for pancreatic cancer.  Right upper quadrant ultrasound in February revealed nodular hepatic contour with coarse hepatic echogenicity consistent with known cirrhosis.  No focal hepatic abnormality was identified. She had a CT abdomen in August 2023, and this is stable.  Repeat CT scans from August 2024 remains negative for malignancy.   Liver cirrhosis secondary to NASH (nonalcoholic steatohepatitis) (HCC) Liver cirrhosis as seen on CT imaging in August 2020.  She was referred to [facility], CRNP, at the [facility] in [place].  Hepatitis panel was negative.  She received the appropriate vaccines.  CT imaging in May 2022 was stable.  Linda [name] recommended every 6 month screening for hepatocellular cancer with ultrasound alternating with cross-sectional imaging to allow for screening for pancreatic cancer.  Right upper quadrant ultrasound in February did not reveal any  focal hepatic abnormality.  CT imaging in August was to screen for hepatocellular cancer due to cirrhosis and we see 2 tiny hypodense liver lesions in the right lobe measuring 4 mm each and stable from September 2021.  The pancreas appears normal, we are screening due to her BRCA1 mutation.  Her current CT scan from September 19, 2022 reveals coarse nodular cirrhotic morphology of the liver but no focal liver lesions.  She also has splenomegaly and I think this may be symptomatic.  I will schedule an ultrasound of the liver in 6 months to monitor for any liver lesions.   Idiopathic thrombocytopenic purpura (ITP) (HCC) Thrombocytopenia, which is felt to be due to chronic ITP and liver cirrhosis.  She continues to have regular blood work at Dr. [name] office.  CBC in February revealed stable mild thrombocytopenia with a platelet count of 99,000.  Her platelet count today was back down to 74,000 last time but then improved to 91,000.  Today it is back down to 76,000.  The thrombocytopenia could also be due to her liver disease.  Worsening anemia Her hemoglobin has decreased to 10.7 and so I will check B12 and folate levels.  The MCV is 82.  Her white count is normal at 3.8 with a normal ANC.  She is already taking iron supplement.  We may want to recheck all of these levels again next year to see if she is absorbing properly.  Pulmonary nodule CT of the chest was done in November 2023 and showed resolution of the tree-in-bud nodularity in the posterior right lung base.  However she did have a new 6 mm left upper lobe pulmonary nodule.  This nodule has now resolved as of the CT scan from August 2024.  Multiple toe amputations Her  diabetes has not been well-controlled and she admits to an unhealthy diet.  She has now had multiple partial toe amputations in February 2024.  He has been followed by podiatry and the wound center.   Plan:  We will flush her port today.  I think some of her abdominal symptoms are  related to hypersplenism and possible ascites.  I will check a B12 and folate to evaluate her worsening anemia and thrombocytopenia.  I will alternate visits with [name].  I will see her back in 3 months with CBC, CMP, PT/INR, and alpha-fetoprotein.  We will plan to flush her port at that time.  We can plan a liver ultrasound in 6 months, but I would recommend a CT scan yearly since she does have BRCA1 positivity and is at increased risk for pancreatic cancer.  The patient understands the plans discussed today and is in agreement with them.  She knows to contact our office if she develops concerns prior to her next appointment.   I provided 30 minutes of face-to-face time during this encounter and > 50% was spent counseling as documented under my assessment and plan.   [name], MD  [facility] - A DEPT OF [name] [address] [place] [address] Dept: [phone] Dept Fax: [phone]   No orders of the defined types were placed in this encounter.   CHIEF COMPLAINT:  CC: Stage IB triple negative breast cancer  Current Treatment: Observation  HISTORY OF PRESENT ILLNESS:  Linda [name] is a 53 Y.O. female who we began seeing in February 2018 for evaluation of anemia.  Her anemia was felt to be secondary to iron deficiency and we recommended continuation of oral iron supplementation for a total of 6 months, as well as referral back to the gastroenterologist.  The patient also has a history of thrombocytopenia and had previously seen Dr. Melvyn Neth in 2014.  The thrombocytopenia was felt to be secondary to mild chronic immune thrombocytopenic purpura (ITP).  Due to the patient's family history of malignancy, she underwent testing for hereditary cancer syndromes with the [employer] myRisk Hereditary Cancer Panel test.  This revealed a mutation in the BRCA 1 gene, which is associated with a significantly increased risk for  breast and ovarian cancer, and an elevated risk of pancreatic cancer.  She underwent a robotic hysterectomy and bilateral salpingo-oophorectomy in May 2018 with Dr. Adolphus Birchwood.  Pathology was benign.  We also discussed the option of risk reducing bilateral mastectomy, but she chose to have close surveillance, so we recommended annual MRI breast, in addition to annual mammography.  She was placed on chemoprevention with raloxifene in September 2018.  CT abdomen and pelvis in August 2020 done for bilateral flank pain, revealed a tiny left renal calculus, as well as probable hepatic cirrhosis without evidence of hepatic mass.  She was then referred to [facility] and is followed by Annamarie Major, CRNP in [place] for her liver cirrhosis.  She tested negative for hepatitis A, B, and C.  She received her hepatitis vaccines in 2020.   We saw her in September 2021 for a new diagnosis of stage IB (T1c N0 M0) triple negative right breast cancer.  She underwent screening bilateral mammogram on August 3rd which revealed possible masses in the right breast.  Diagnostic right mammogram and right breast ultrasound from August 18th confirmed suspicious masses in the right breast at 5 o'clock measuring  1.5 cm and 6 o'clock measuring 1.4 cm.  There was an indeterminate 4 mm with possible distortion in the outer right breast without sonographic correlate.  Right axillary ultrasound was normal.  She then underwent biopsies of both masses and surgical pathology from these procedures revealed  invasive ductal carcinoma, grade 3, with necrosis at 5 o'clock; and invasive ductal carcinoma, grade 1-2, with necrosis and focal myxoid change at 6 o'clock.  No DCIS was identified.  Breast prognostic profiles revealed HER2, and estrogen and progesterone receptors to be negative. Ki67 was 30% at the 5 o'clock mass, and 40% at 6 o'clock.  She underwent bilateral mastectomies on September 24th with Dr. Georgiana Shore.  Surgical pathology from this procedure revealed invasive ductal carcinoma, grade 3, with myxoid change, 19 mm, at 6 o'clock, and invasive ductal carcinoma, grade 3, and ductal carcinoma in situ, 19 mm, at 5 o'clock.  All margins were negative for invasive carcinoma or DCIS.  Two sentinel lymph nodes were negative for metastatic carcinoma (0/2). CT chest, abdomen and pelvis in September did not reveal any evidence of metastatic disease.  She started dose dense AC chemotherapy on October 25th , with plans to follow this with weekly paclitaxel for 12 doses.  She had significantly worsened anemia with a hemoglobin of 8.7, as well as pancytopenia, so the 4th cycle of chemotherapy was delayed, but she finally got her last dose on January 4th. Her anemia has slowly improved.  She started weekly paclitaxel on January 26th and has received 8 out of 12 planned doses.   She did have some expected pancytopenia.  She had pre-existing neuropathy of the feet and legs.     She was admitted to the hospital for osteomyelitis of the left 2nd toe in March 2022.  She was treated with empiric vancomycin and Zosyn.  Amputation was recommended but the patient refused.  Cultures returned with Klebsiella, sensitive to IV Rocephin, so long term antibiotics 2 g Q24H for continued for the next 6 weeks. At that time, we decided to discontinue weekly paclitaxel, as she had received 8/12 cycles and had significant neuropathy.  She has done fairly well since that time.  She has mild chronic thrombocytopenia felt to be chronic ITP, although, this could be secondary to her liver disease.  In February 2023, a liver ultrasound did not reveal any liver masses.  Linda [name] recommended alternating ultrasound with cross-sectional imaging to allow for pancreatic cancer screening due to her BRCA1 mutation.  INTERVAL HISTORY:  Analysse is here today for repeat clinical assessment for Stage IB triple negative breast cancer, as well as follow-up of  thrombocytopenia.  She is also positive for BRCA1 and has a history of liver cirrhosis with cytopenias and splenomegaly. Patient states that she feels *** and ***.     She denies signs of infection such as sore throat, sinus drainage, cough, or urinary symptoms.  She denies fevers or recurrent chills. She denies pain. She denies nausea, vomiting, chest pain, dyspnea or cough. Her appetite is *** and her weight {Weight change:10426}.  She also complains of fatigue and abdominal swelling which fluctuates, especially in the left upper quadrant.  She had to have surgery in February 2024 for partial toe amputation of both feet for 5 toes total.  CT chest results from 12/10/21 which showed resolution of tree-in-bud nodularity in the posterior right lung base and a 6mm left upper lobe pulmonary nodule, new since previous CTA chest 04/08/2020.Marland Kitchen  Her current CT scan from September 19, 2022 reveals resolution of this pulmonary nodule.  It also reveals coarse nodular cirrhotic morphology of the liver but no focal liver lesions.  Her PCP [name] is also following her closely, with labs every 3 months..  She will have her port flushed today.     REVIEW OF SYSTEMS:  Review of Systems  Constitutional:  Positive for fatigue. Negative for appetite change, chills, diaphoresis, fever and unexpected weight change.  HENT:  Negative.  Negative for hearing loss, lump/mass, mouth sores, nosebleeds, sore throat, tinnitus, trouble swallowing and voice change.   Eyes: Negative.   Respiratory: Negative.  Negative for chest tightness, cough, hemoptysis, shortness of breath and wheezing.   Cardiovascular: Negative.  Negative for chest pain, leg swelling and palpitations.  Gastrointestinal:  Positive for abdominal distention. Negative for abdominal pain, blood in stool, constipation, diarrhea, nausea, rectal pain and vomiting.  Endocrine: Negative.   Genitourinary: Negative.  Negative for bladder incontinence, difficulty urinating,  dyspareunia, dysuria, frequency, hematuria, menstrual problem, nocturia, pelvic pain, vaginal bleeding and vaginal discharge.   Musculoskeletal: Negative.  Negative for arthralgias, back pain, flank pain, gait problem, myalgias, neck pain and neck stiffness.  Skin:  Positive for wound (left 2nd toe). Negative for itching and rash.       + bruising   Neurological:  Negative for dizziness, extremity weakness, gait problem, headaches, light-headedness, numbness, seizures and speech difficulty.  Hematological:  Negative for adenopathy. Bruises/bleeds easily.  Psychiatric/Behavioral: Negative.  Negative for confusion, decreased concentration, depression, sleep disturbance and suicidal ideas. The patient is not nervous/anxious.      VITALS:  Last menstrual period 06/13/2009.  Wt Readings from Last 3 Encounters:  01/24/23 207 lb (93.9 kg)  01/07/23 206 lb (93.4 kg)  01/05/23 205 lb (93 kg)    There is no height or weight on file to calculate BMI.  Performance status (ECOG): 1 - Symptomatic but completely ambulatory  PHYSICAL EXAM:  Physical Exam Vitals and nursing note reviewed.  Constitutional:      General: She is not in acute distress.    Appearance: Normal appearance. She is normal weight. She is not ill-appearing or diaphoretic.  HENT:     Head: Normocephalic and atraumatic.     Comments: Soft cystic-feeling lump at the vertex of her scalp which I feel is benign    Right Ear: Tympanic membrane, ear canal and external ear normal. There is no impacted cerumen.     Left Ear: Tympanic membrane, ear canal and external ear normal. There is no impacted cerumen.     Nose: Nose normal. No congestion or rhinorrhea.     Mouth/Throat:     Mouth: Mucous membranes are moist.     Pharynx: Oropharynx is clear. No oropharyngeal exudate or posterior oropharyngeal erythema.  Eyes:     General: No scleral icterus.       Right eye: No discharge.        Left eye: No discharge.     Extraocular  Movements: Extraocular movements intact.     Conjunctiva/sclera: Conjunctivae normal.     Pupils: Pupils are equal, round, and reactive to light.  Neck:     Vascular: No carotid bruit.  Cardiovascular:     Rate and Rhythm: Normal rate and regular rhythm.     Pulses: Normal pulses.     Heart sounds: Normal heart sounds. No murmur heard.    No friction rub. No gallop.  Pulmonary:     Effort: Pulmonary effort is normal. No respiratory distress.  Breath sounds: Normal breath sounds. No stridor. No wheezing, rhonchi or rales.  Chest:     Chest wall: No tenderness.  Breasts:    Right: Absent.     Left: Absent.     Comments: Both mastectomies scars have mild nodularity consistent with scar tissue. Abdominal:     General: Bowel sounds are normal. There is no distension.     Palpations: Abdomen is soft. There is no hepatomegaly, splenomegaly or mass.     Tenderness: There is no abdominal tenderness. There is no right CVA tenderness, left CVA tenderness, guarding or rebound.     Hernia: No hernia is present.     Comments: She has mild hepatomegaly and mild to moderate splenomegaly.  She may have some mild ascites.  Musculoskeletal:        General: No swelling, tenderness, deformity or signs of injury. Normal range of motion.     Cervical back: Normal range of motion and neck supple. No rigidity or tenderness.     Right lower leg: No edema.     Left lower leg: Edema (mild) present.     Comments: Resolving ecchymosis and resolving abrasion in right upper shoulder.  Lymphadenopathy:     Cervical: No cervical adenopathy.     Right cervical: No superficial, deep or posterior cervical adenopathy.    Left cervical: No superficial, deep or posterior cervical adenopathy.     Upper Body:     Right upper body: No supraclavicular, axillary or pectoral adenopathy.     Left upper body: No supraclavicular, axillary or pectoral adenopathy.  Skin:    General: Skin is warm and dry.     Coloration:  Skin is not jaundiced or pale.     Findings: Abrasion present. No bruising, erythema, lesion or rash.     Comments: Fairly large abrasion of the posterior right elbow area. Does not appear to be infected.  Neurological:     General: No focal deficit present.     Mental Status: She is alert and oriented to person, place, and time. Mental status is at baseline.     Cranial Nerves: No cranial nerve deficit.     Sensory: No sensory deficit.     Motor: No weakness.     Coordination: Coordination normal.     Gait: Gait normal.     Deep Tendon Reflexes: Reflexes normal.  Psychiatric:        Mood and Affect: Mood normal.        Behavior: Behavior normal.        Thought Content: Thought content normal.        Judgment: Judgment normal.    LABS:      Latest Ref Rng & Units 01/07/2023   12:27 PM 01/02/2023    2:50 PM 12/21/2022    9:06 AM  CBC  WBC 4.0 - 10.5 K/uL 3.9  6.4  4.4   Hemoglobin 12.0 - 15.0 g/dL 29.5  62.1  30.8   Hematocrit 36.0 - 46.0 % 36.5  42.8  37.5   Platelets 150 - 400 K/uL 65  86  79       Latest Ref Rng & Units 12/21/2022    9:06 AM 12/20/2022   11:01 AM 09/19/2022    9:45 AM  CMP  Glucose 70 - 99 mg/dL 657  846    BUN 6 - 20 mg/dL 17  12  10       Creatinine 0.44 - 1.00 mg/dL 9.62  9.52  0.7  Sodium 135 - 145 mmol/L 140  139  139      Potassium 3.5 - 5.1 mmol/L 4.0  4.1  3.9      Chloride 98 - 111 mmol/L 105  102  103      CO2 22 - 32 mmol/L 25  27  26       Calcium 8.9 - 10.3 mg/dL 9.2  9.2  9.1      Total Protein 6.5 - 8.1 g/dL 7.0  6.3    Total Bilirubin <1.2 mg/dL 0.6  0.4    Alkaline Phos 38 - 126 U/L 81  93  74      AST 15 - 41 U/L 24  24  24       ALT 0 - 44 U/L 17  13  14          This result is from an external source.   No results found for: "CEA1", "CEA" / No results found for: "CEA1", "CEA" No results found for: "PSA1" No results found for: "ZOX096" No results found for: "CAN125"  No results found for: "TOTALPROTELP", "ALBUMINELP",  "A1GS", "A2GS", "BETS", "BETA2SER", "GAMS", "MSPIKE", "SPEI" Lab Results  Component Value Date   TIBC 312 05/20/2020   TIBC 300 01/13/2020   FERRITIN 98 05/20/2020   IRONPCTSAT 26 05/20/2020   IRONPCTSAT 24.3 01/13/2020   No results found for: "LDH"  STUDIES:  LONG TERM MONITOR (3-14 DAYS) Result Date: 02/27/2023 HR 68 - 190, average 85 bpm. 1 nonsustained SVT lasting 5 beats. Rare supraventricular and ventricular ectopy. No sustained arrhythmias. No atrial fibrillation. Sheria Lang T. Lalla Brothers, MD, [facility], [facility]     HISTORY:   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 2 diabetes mellitus, with fat layer exposed (HCC) 03/27/2020   Gastritis    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 No longer has    Past Surgical History:  Procedure Laterality Date   AMPUTATION TOE Bilateral 03/04/2022   Procedure: AMPUTATION TOE RIGHT  FOOT PARTIAL OR TOTAL GREAT TOE AND SECOND TOE, LEFT FOOT PARTIAL OR TOTAL GREAT TOE, SECOND AND THIRD TOE;  Surgeon: Pilar Plate, DPM;  Location: [facility] OR;  Service: Podiatry;  Laterality: Bilateral;   AMPUTATION TOE Right 01/04/2023   Procedure:  R 1ST TOE AMPUTATION, RIGHT 3RD TOE AMPUTATION;  Surgeon: Pilar Plate, DPM;  Location: [facility] ORS;  Service: Orthopedics/Podiatry;  Laterality: Right;   APPENDECTOMY     BACK SURGERY     x 3 lower disc   BILATERAL TOTAL MASTECTOMY WITH AXILLARY LYMPH NODE DISSECTION Bilateral 09/2019   CHOLECYSTECTOMY     nephrolithiasis     ROBOTIC ASSISTED TOTAL HYSTERECTOMY WITH BILATERAL SALPINGO OOPHERECTOMY Bilateral 06/14/2016   Procedure: ROBOTIC ASSISTED TOTAL HYSTERECTOMY WITH BILATERAL SALPINGO OOPHORECTOMY;  Surgeon: Cleda Mccreedy, MD;  Location: [facility] ORS;  Service: Gynecology;  Laterality: Bilateral;    Family History  Problem Relation Age of Onset   Cancer Mother        breast   CAD Father    Diabetes Father    Heart failure Father    Cancer Father        renal carcinoma   Kidney failure Father    Cancer Brother 46       renal carcinoma.   Stroke Paternal Grandmother     Social History:  reports that she has never smoked. She has never used smokeless tobacco. She reports that she does not drink alcohol and does not use drugs.The patient is alone today.  Allergies:  Allergies  Allergen Reactions   Codeine Shortness Of Breath and Other (See Comments)    Other reaction(s): SHOB    Augmentin [Amoxicillin-Pot Clavulanate] Diarrhea   Celebrex [Celecoxib] Other (See Comments)    Unknown reaction   Propranolol Other (See Comments)    Other reaction(s): Unknown   Propranolol Hcl Other (See Comments)    Unknown reaction  ask   Simvastatin Other (See Comments)    Other reaction(s): Unknown   Vytorin [Ezetimibe-Simvastatin] Other (See Comments)    Unknown reaction  Patient is not aware of an allergy to this medication, ask    Zetia [Ezetimibe] Other (See Comments)    Other reaction(s): Unknown    Current Medications: Current Outpatient Medications  Medication Sig Dispense Refill   acetaminophen (TYLENOL) 500 MG tablet Take 500 mg by mouth every 6 (six) hours as needed for moderate pain or mild pain.     albuterol (VENTOLIN HFA) 108 (90 Base) MCG/ACT inhaler INHALE TWO PUFFS BY MOUTH INTO LUNGS every SIX hours orn FOR WHEEZING AND/OR SHORTNESS OF BREATH 8.5 g 1   amitriptyline (ELAVIL) 25 MG tablet TAKE ONE TABLET BY MOUTH AT BEDTIME 30 tablet 5   atorvastatin (LIPITOR) 10 MG tablet TAKE ONE TABLET BY MOUTH EVERY EVENING 90 tablet 1   Blood Glucose Monitoring Suppl (ONETOUCH VERIO REFLECT) w/Device KIT AS DIRECTED     Blood Pressure Monitoring (SPHYGMOMANOMETER) MISC 1 each by Does not apply route daily in the afternoon. 1 each 0   buPROPion (WELLBUTRIN XL) 300 MG 24 hr tablet Take 1 tablet (300 mg total) by mouth every morning. 90 tablet 3   busPIRone (BUSPAR) 5 MG tablet TAKE ONE TABLET BY MOUTH 3 TIMES DAILY 90 tablet 1   clotrimazole-betamethasone (LOTRISONE) cream Apply twice daily as needed to area of concern 45 g 2   Continuous Blood Gluc Receiver (FREESTYLE LIBRE 2 READER) DEVI E11.69 Check blood sugar 4 times daily as directed 1 each 0   Continuous Glucose Sensor (FREESTYLE LIBRE 3 PLUS SENSOR) MISC Change sensor every 15 days. 6 each 1   cyclobenzaprine (FLEXERIL) 10 MG tablet TAKE ONE TABLET BY MOUTH EVERY 8 HOURS AS NEEDED FOR MUSCLE SPASMS 270 tablet 1   dicyclomine (BENTYL) 20 MG tablet  TAKE ONE TABLET BY MOUTH BEFORE MEALS AND AT BEDTIME AS NEEDED FOR STOMACH CRAMPING 180 tablet 1   ferrous sulfate 325 (65 FE) MG tablet Take 325 mg by mouth every evening.     FETZIMA 80 MG CP24 TAKE ONE CAPSULE BY MOUTH EVERY EVENING 90 capsule 1   gabapentin (NEURONTIN) 400 MG capsule TAKE 2 CAPSULES BY MOUTH 3 TIMES DAILY 180 capsule 1   glucose blood test strip Use as instructed to check FBS daily. E11.40 100  each 12   Insulin Pen Needle (BD PEN NEEDLE NANO U/F) 32G X 4 MM MISC Use new needle with each injection. 30 each 2   Ketoconazole 2 % GEL Apply 1 Application topically daily. 45 g 0   Lancets (ONETOUCH DELICA PLUS LANCET30G) MISC USE TO check blood glucose 2-3 times daily AS DIRECTED 100 each 3   levothyroxine (SYNTHROID) 75 MCG tablet Take 1 tablet (75 mcg total) by mouth daily before breakfast. 90 tablet 3   lidocaine (LIDODERM) 5 % Place 3 patches onto the skin daily. Remove & Discard patch within 12 hours or as directed by MD 90 patch 0   losartan (COZAAR) 50 MG tablet TAKE ONE TABLET BY MOUTH EVERY EVENING 90 tablet 1   Magnesium 500 MG CAPS Take 1 capsule (500 mg total) by mouth daily. 90 capsule 1   metoprolol succinate (TOPROL-XL) 25 MG 24 hr tablet Take 0.5 tablets (12.5 mg total) by mouth daily. 45 tablet 0   morphine (MS CONTIN) 30 MG 12 hr tablet Take 1 tablet (30 mg total) by mouth every 12 (twelve) hours. 60 tablet 0   MOUNJARO 7.5 MG/0.5ML Pen INJECT 7.5 MG INTO THE SKIN EVERY WEEK 2 mL 1   Multiple Vitamin (MULTIVITAMIN WITH MINERALS) TABS tablet Take 1 tablet by mouth daily. Solar ray     mupirocin ointment (BACTROBAN) 2 % Apply 1 Application topically daily. 22 g 0   omega-3 acid ethyl esters (LOVAZA) 1 g capsule TAKE 2 CAPSULES BY MOUTH TWICE DAILY 360 capsule 1   ondansetron (ZOFRAN) 4 MG tablet Take 1 tablet (4 mg total) by mouth every 4 (four) hours as needed for nausea. 90 tablet 3   OVER THE COUNTER MEDICATION Take 1 tablet by mouth in the morning and at bedtime. Lutein for eyes     pantoprazole (PROTONIX) 40 MG tablet TAKE ONE TABLET BY MOUTH TWICE DAILY 180 tablet 0   polyethylene glycol (MIRALAX / GLYCOLAX) 17 g packet Take 17 g by mouth daily as needed for moderate constipation.     potassium chloride (MICRO-K) 10 MEQ CR capsule Take 2 capsules (20 mEq total) by mouth 2 (two) times daily. 360 capsule 3   Probiotic Product (PROBIOTIC PO) Take 1 capsule by mouth in the  morning. 3.2 billion cfu     prochlorperazine (COMPAZINE) 5 MG tablet TAKE ONE TABLET BY MOUTH every SIX hours AS NEEDED FOR NAUSEA AND VOMITING 30 tablet 1   ramelteon (ROZEREM) 8 MG tablet TAKE ONE TABLET BY MOUTH AT BEDTIME 90 tablet 1   rOPINIRole (REQUIP) 0.25 MG tablet TAKE ONE TABLET BY MOUTH AT BEDTIME 90 tablet 1   SYNJARDY 12.05-998 MG TABS TAKE ONE TABLET BY MOUTH TWICE DAILY 60 tablet 1   TRESIBA FLEXTOUCH 200 UNIT/ML FlexTouch Pen INJECT 60 UNITS SUBCUTANEOUSLY DAILY 9 mL 3   Vitamin D, Ergocalciferol, (DRISDOL) 1.25 MG (50000 UNIT) CAPS capsule TAKE ONE CAPSULE BY MOUTH EVERY FRIDAY AT 8AM 12 capsule 1   VRAYLAR 3 MG  capsule TAKE ONE CAPSULE BY MOUTH EVERY DAY 90 capsule 0   VYVANSE 70 MG capsule TAKE ONE CAPSULE BY MOUTH EVERY DAY 30 capsule 0   No current facility-administered medications for this visit.   Facility-Administered Medications Ordered in Other Visits  Medication Dose Route Frequency Provider Last Rate Last Admin   sodium chloride flush (NS) 0.9 % injection 10 mL  10 mL Intracatheter PRN Mosher, Harvin Hazel A, PA-C   10 mL at 02/19/21 1004   I,Jasmine M Lassiter,acting as a Neurosurgeon for [name], MD.,have documented all relevant documentation on the behalf of [name], MD,as directed by  [name], MD while in the presence of [name], MD.  I have reviewed this report as typed by the medical scribe, and it is complete and accurate.  Gerline Legacy Lassiter   03/21/23 11:03 AM

## 2023-03-22 ENCOUNTER — Inpatient Hospital Stay: Payer: PPO

## 2023-03-22 ENCOUNTER — Inpatient Hospital Stay: Payer: PPO | Admitting: Oncology

## 2023-03-27 ENCOUNTER — Other Ambulatory Visit: Payer: Self-pay | Admitting: Family Medicine

## 2023-03-28 NOTE — Progress Notes (Signed)
 [name]  [address] 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 She was treated with bilateral mastectomy due to BRCA1 mutation.  She completed 4 cycles of Adriamycin/cyclophosphamide chemotherapy and received 8 out of 12 planned weeks of weekly paclitaxel.  She remains without evidence of recurrence. She asked me about her risk and I told her she is at low risk for recurrence.   BRCA1 positive Positive BRCA 1 mutation, which increases her risk for breast and ovarian cancer, as well as elevates her risk for pancreatic cancer.  She underwent bilateral mastectomy and hysterectomy/bilateral salpingo-oophorectomy.  The hepatologist recommended biannual screening for hepatocellular carcinoma with ultrasound alternating with cross-sectional imaging to allow screening for pancreatic cancer.  Right upper quadrant ultrasound in February revealed nodular hepatic contour with coarse hepatic echogenicity consistent with known cirrhosis.  No focal hepatic abnormality was identified. She had a CT abdomen in August 2023, and this is stable.  Repeat CT scans from August 2024 remain negative for malignancy.   Liver cirrhosis secondary to NASH (nonalcoholic steatohepatitis) (HCC) Liver cirrhosis as seen on CT imaging in August 2020.  She was referred to [facility], CRNP, at the [facility] in [name].  Hepatitis panel was negative.  She received the appropriate vaccines. Ms. [name] recommended every 6 month screening for hepatocellular cancer with ultrasound alternating with cross-sectional imaging to allow for screening for pancreatic cancer.   Right upper quadrant ultrasound in February did not reveal any focal hepatic abnormality.  CT imaging in August was to screen for hepatocellular cancer due to cirrhosis and we see 2 tiny hypodense liver lesions in the right lobe measuring 4 mm each and stable from September 2021.  The pancreas appears normal, we are screening due to her BRCA1 mutation.  Her current CT scan from September 19, 2022 reveals coarse nodular cirrhotic morphology of the liver but no focal liver lesions.  She also has splenomegaly and I think this may be symptomatic.  I will schedule an ultrasound of the liver now to monitor for any liver lesions.   Idiopathic thrombocytopenic purpura (ITP) (HCC) Thrombocytopenia, which is felt to be due to chronic ITP and liver cirrhosis, so multifactorial.  She continues to have regular blood work at Dr. [name] office. Her platelet were down to 67,000 in December, 2024 but are now up to 80,000 as of February, 2025.   Pulmonary nodule CT of the chest was done in November 2023 and showed resolution of the tree-in-bud nodularity in the posterior right lung base.  However she did have a new 6 mm left upper lobe pulmonary nodule.  This nodule has now resolved as of the CT scan from August 2024.  Multiple toe amputations Her diabetes has not been well-controlled and she admits to an unhealthy diet.  She had multiple partial left toe amputations in February, 2024.  She has been followed by podiatry and the wound center. She has now had amputation of 2 more toes of the right foot in December, 2024.   Plan: She informed me that since her last visit she developed shingles in the left mid thorax. I advised her to get the shingles vaccine later in the year. Patient  did inquire about the pathology from her right great toe and right 3rd toe amputation done on 01/04/2023 and so I reviewed this with her. Her right great toe bone was found to have degenerative disease with osteoarthritis and her 3rd toe skin was  found had focal ulcer and marked hyperparakeratosis while the bone was found to have degenerative disease with osteoarthritis. She did inform me that she went to the ED 3 times that weekend due to excessive bleeding after surgery. I explained that this was due to her chronic low platelet count. I told her to contact us if she has this kind of problem again as she might require platelet transfusion. Her last protime-INR was within normal range in November, 2024 however her platelet count has been below 87,000 since August, 2024. She has a WBC of 5.5, hemoglobin of 12.5, and low platelet count of 80,000 improved from 65,000 in December. Her CMP is normal other than a slightly elevated creatinine of 1.10 and glucose of 348. Her folate, ferritin, iron studies, B12, protime-INR, and AFP tumor marker are pending. During physical exam I noticed a yeast infection under the left mastectomy. I will order Nystatin powder for her. She will meet with her PCP on 04/03/2023. I will schedule a ultrasound of the liver and she had her port flushed today. I will see her back in 3 months with CBC, CMP, and port flush. The patient understands the plans discussed today and is in agreement with them.  She knows to contact our office if she develops concerns prior to her next appointment.  I provided 15 minutes of face-to-face time during this encounter and > 50% was spent counseling as documented under my assessment and plan.   [name], MD  [facility] - A DEPT OF [name] [address] [place] [address] Dept: [phone] Dept Fax: [phone]   No orders of the defined types were placed in this encounter.   CHIEF COMPLAINT:  CC: Stage IB triple negative breast cancer  Current Treatment: Observation  HISTORY OF PRESENT ILLNESS:  Linda [name] is a 53 Y.O. female who we began seeing in February 2018 for evaluation of anemia.  Her  anemia was felt to be secondary to iron deficiency and we recommended continuation of oral iron supplementation for a total of 6 months, as well as referral back to the gastroenterologist.  The patient also has a history of thrombocytopenia and had previously seen Dr. Melvyn Neth in 2014.  The thrombocytopenia was felt to be secondary to mild chronic immune thrombocytopenic purpura (ITP).  Due to the patient's family history of malignancy, she underwent testing for hereditary cancer syndromes with the My[employer] myRisk Hereditary Cancer Panel test.  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  She underwent a robotic hysterectomy and bilateral salpingo-oophorectomy in May 2018 with Dr. Adolphus Birchwood.  Pathology was benign.  We also discussed the option of risk reducing bilateral mastectomy, but she chose to have close surveillance, so we recommended annual MRI breast, in addition to annual mammography.  She was placed on chemoprevention with raloxifene in September 2018.  CT abdomen and pelvis in August 2020 done for bilateral flank pain, revealed a tiny left renal calculus, as well as probable hepatic cirrhosis without evidence of hepatic mass.  She was then referred to [facility] and is followed by Annamarie Major, CRNP in [place] for her liver cirrhosis.  She tested negative for hepatitis A, B, and C.  She received her hepatitis vaccines in 2020.   We saw her in September 2021 for a new diagnosis of stage IB (T1c N0 M0) triple negative right breast cancer.  She underwent screening bilateral mammogram on August 3rd which revealed possible masses in the right breast.  Diagnostic right mammogram and right breast ultrasound from August 18th confirmed suspicious masses in the right breast at 5 o'clock measuring 1.5 cm and 6 o'clock measuring 1.4 cm.  There was an  indeterminate 4 mm with possible distortion in the outer right breast without sonographic correlate.  Right axillary ultrasound was normal.  She then underwent biopsies of both masses and surgical pathology from these procedures revealed  invasive ductal carcinoma, grade 3, with necrosis at 5 o'clock; and invasive ductal carcinoma, grade 1-2, with necrosis and focal myxoid change at 6 o'clock.  No DCIS was identified.  Breast prognostic profiles revealed HER2, and estrogen and progesterone receptors to be negative. Ki67 was 30% at the 5 o'clock mass, and 40% at 6 o'clock.  She underwent bilateral mastectomies on September 24th with Dr. Georgiana Shore. Surgical pathology from this procedure revealed invasive ductal carcinoma, grade 3, with myxoid change, 19 mm, at 6 o'clock, and invasive ductal carcinoma, grade 3, and ductal carcinoma in situ, 19 mm, at 5 o'clock.  All margins were negative for invasive carcinoma or DCIS.  Two sentinel lymph nodes were negative for metastatic carcinoma (0/2). CT chest, abdomen and pelvis in September did not reveal any evidence of metastatic disease.  She started dose dense AC chemotherapy on October 25th , with plans to follow this with weekly paclitaxel for 12 doses.  She had significantly worsened anemia with a hemoglobin of 8.7, as well as pancytopenia, so the 4th cycle of chemotherapy was delayed, but she finally got her last dose on January 4th. Her anemia has slowly improved.  She started weekly paclitaxel on January 26th and has received 8 out of 12 planned doses.   She did have some expected pancytopenia.  She had pre-existing neuropathy of the feet and legs.     She was admitted to the hospital for osteomyelitis of the left 2nd toe in March 2022.  She was treated with empiric vancomycin and Zosyn.  Amputation was recommended but the patient refused.  Cultures returned with Klebsiella, sensitive to IV Rocephin, so long term antibiotics 2 g Q24H for continued for the next 6  weeks. At that time, we decided to discontinue weekly paclitaxel, as she had received 8/12 cycles and had significant neuropathy.  She has done fairly well since that time.  She has mild chronic thrombocytopenia felt to be chronic ITP, although, this could be secondary to her liver disease.  In February 2023, a liver ultrasound did not reveal any liver masses.  Ms. [name] recommended alternating ultrasound with cross-sectional imaging to allow for pancreatic cancer screening due to her BRCA1 mutation.  On 01/04/23 she had amputation of the right great toe and 3rd toe. She had excessive bleeding causing several visits to the ER, and her platelet count has been below 87,000 since August of 2024, down to 65,000 at the end of the year.  She also had shingles in 2024 of the left mid thorax.  INTERVAL HISTORY:  Linda [name] is here today for repeat clinical assessment for Stage IB triple negative breast cancer, as well  as follow-up of chronic thrombocytopenia.  She is also positive for BRCA1 and has a history of liver cirrhosis with cytopenias and splenomegaly. Patient states that she feels well and no complaints of pain. She informed me that since her last visit she developed shingles in the left mid thorax. I advised her to get the shingles vaccine later in the year. Patient did inquire about the pathology from her right great toe and right 3rd toe amputation done on 01/04/2023 and so I reviewed this with her. Her right great toe bone was found to have degenerative disease with osteoarthritis and her 3rd toe skin was found to have a focal ulcer and marked hyperparakeratosis while the bone was found to have degenerative disease with osteoarthritis. She did inform me that she went to the ED 3 times that weekend due to excessive bleeding after surgery. I explained that this was due to her chronic low platelet count. I told her to contact us if she has this kind of problem again as she might require platelet transfusion. Her  last protime-INR was within normal range in November, 2024 however her platelet count has been below 87,000 since August, 2024. She has a WBC of 5.5, hemoglobin of 12.5, and low platelet count of 80,000, improved from 65,000. Her CMP is normal other than a slightly elevated creatinine of 1.10 and glucose of 348. Her folate, ferritin, iron studies, B-12, protime-INR, and AFP tumor marker are pending. During physical exam I noticed a yeast infection under the left mastectomy. I will order Nystatin powder for her to apply to this area. She will meet with her PCP on 04/03/2023. I will schedule a ultrasound of the liver and she will have her port flushed today. I will see her back in 3 months with CBC, CMP, and port flush.   She denies signs of infection such as sore throat, sinus drainage, cough, or urinary symptoms.  She denies fevers or recurrent chills. She denies pain. She denies nausea, vomiting, chest pain, dyspnea or cough. Her appetite is very good and her weight has decreased 4 pounds over last 2 months .   REVIEW OF SYSTEMS:  Review of Systems  Constitutional:  Positive for fatigue. Negative for appetite change, chills, diaphoresis, fever and unexpected weight change.  HENT:  Negative.  Negative for hearing loss, lump/mass, mouth sores, nosebleeds, sore throat, tinnitus, trouble swallowing and voice change.   Eyes: Negative.  Negative for eye problems and icterus.  Respiratory: Negative.  Negative for chest tightness, cough, hemoptysis, shortness of breath and wheezing.   Cardiovascular: Negative.  Negative for chest pain, leg swelling and palpitations.  Gastrointestinal: Negative.  Negative for abdominal distention, abdominal pain, blood in stool, constipation, diarrhea, nausea, rectal pain and vomiting.  Endocrine: Negative.   Genitourinary: Negative.  Negative for bladder incontinence, difficulty urinating, dyspareunia, dysuria, frequency, hematuria, menstrual problem, nocturia, pelvic pain,  vaginal bleeding and vaginal discharge.   Musculoskeletal: Negative.  Negative for arthralgias, back pain, flank pain, gait problem, myalgias, neck pain and neck stiffness.  Skin:  Positive for wound (amputation of the right great and 3rd toe). Negative for itching and rash.  Neurological:  Negative for dizziness, extremity weakness, gait problem, headaches, light-headedness, numbness, seizures and speech difficulty.  Hematological:  Negative for adenopathy. Bruises/bleeds easily.  Psychiatric/Behavioral: Negative.  Negative for confusion, decreased concentration, depression, sleep disturbance and suicidal ideas. The patient is not nervous/anxious.     VITALS:  Blood pressure 124/83, pulse (!) 105, temperature 98.3 F (36.8 C), temperature  source Oral, resp. rate 18, height 5\' 2"  (1.575 m), weight 203 lb 6.4 oz (92.3 kg), last menstrual period 06/13/2009, SpO2 100%.  Wt Readings from Last 3 Encounters:  04/08/23 207 lb 0.2 oz (93.9 kg)  03/31/23 204 lb (92.5 kg)  03/29/23 203 lb 6.4 oz (92.3 kg)    Body mass index is 37.2 kg/m.  Performance status (ECOG): 1 - Symptomatic but completely ambulatory  PHYSICAL EXAM:  Physical Exam Vitals and nursing note reviewed.  Constitutional:      General: She is not in acute distress.    Appearance: Normal appearance. She is normal weight. She is not ill-appearing, toxic-appearing or diaphoretic.  HENT:     Head: Normocephalic and atraumatic.     Comments: Soft cystic-feeling lump at the vertex of her scalp which I feel is benign    Right Ear: Tympanic membrane, ear canal and external ear normal. There is no impacted cerumen.     Left Ear: Tympanic membrane, ear canal and external ear normal. There is no impacted cerumen.     Nose: Nose normal. No congestion or rhinorrhea.     Mouth/Throat:     Mouth: Mucous membranes are moist.     Pharynx: Oropharynx is clear. No oropharyngeal exudate or posterior oropharyngeal erythema.  Eyes:     General: No  scleral icterus.       Right eye: No discharge.        Left eye: No discharge.     Extraocular Movements: Extraocular movements intact.     Conjunctiva/sclera: Conjunctivae normal.     Pupils: Pupils are equal, round, and reactive to light.  Neck:     Vascular: No carotid bruit.  Cardiovascular:     Rate and Rhythm: Normal rate and regular rhythm.     Pulses: Normal pulses.     Heart sounds: Normal heart sounds. No murmur heard.    No friction rub. No gallop.  Pulmonary:     Effort: Pulmonary effort is normal. No respiratory distress.     Breath sounds: Normal breath sounds. No stridor. No wheezing, rhonchi or rales.  Chest:     Chest wall: No tenderness.  Breasts:    Right: Absent.     Left: Absent.     Comments: Soft tissue in the lateral right and left mastectomy still feels nodular A little bit of a yeast infection under the left mastectomy No masses in either breasts Abdominal:     General: Bowel sounds are normal. There is no distension.     Palpations: Abdomen is soft. There is splenomegaly. There is no hepatomegaly or mass.     Tenderness: There is no abdominal tenderness. There is no right CVA tenderness, left CVA tenderness, guarding or rebound.     Hernia: No hernia is present.     Comments: Liver is about 7cm below the right costal margin Mild splenomegaly  Musculoskeletal:        General: No swelling, tenderness, deformity or signs of injury. Normal range of motion.     Cervical back: Normal range of motion and neck supple. No rigidity or tenderness.     Right lower leg: No edema.     Left lower leg: No edema.  Lymphadenopathy:     Cervical: No cervical adenopathy.     Right cervical: No superficial, deep or posterior cervical adenopathy.    Left cervical: No superficial, deep or posterior cervical adenopathy.     Upper Body:     Right upper body:  No supraclavicular, axillary or pectoral adenopathy.     Left upper body: No supraclavicular, axillary or pectoral  adenopathy.  Skin:    General: Skin is warm and dry.     Coloration: Skin is not jaundiced or pale.     Findings: No abrasion, bruising, erythema, lesion or rash.  Neurological:     General: No focal deficit present.     Mental Status: She is alert and oriented to person, place, and time. Mental status is at baseline.     Cranial Nerves: No cranial nerve deficit.     Sensory: No sensory deficit.     Motor: No weakness.     Coordination: Coordination normal.     Gait: Gait normal.     Deep Tendon Reflexes: Reflexes normal.  Psychiatric:        Mood and Affect: Mood normal.        Behavior: Behavior normal.        Thought Content: Thought content normal.        Judgment: Judgment normal.    LABS:      Latest Ref Rng & Units 04/08/2023    7:51 AM 04/07/2023    4:52 PM 03/29/2023   10:18 AM  CBC  WBC 4.0 - 10.5 K/uL 3.0  6.8  5.5   Hemoglobin 12.0 - 15.0 g/dL 11.9  14.7  82.9   Hematocrit 36.0 - 46.0 % 33.6  41.5  39.9   Platelets 150 - 400 K/uL 64  99  80       Latest Ref Rng & Units 04/08/2023    7:51 AM 04/07/2023    9:29 PM 04/07/2023    4:52 PM  CMP  Glucose 70 - 99 mg/dL 562   130   BUN 6 - 20 mg/dL 8   9   Creatinine 8.65 - 1.00 mg/dL 7.84   6.96   Sodium 295 - 145 mmol/L 139   140   Potassium 3.5 - 5.1 mmol/L 3.7   4.1   Chloride 98 - 111 mmol/L 108   108   CO2 22 - 32 mmol/L 23   22   Calcium 8.9 - 10.3 mg/dL 7.9   8.8   Total Protein 6.5 - 8.1 g/dL 5.3  6.2    Total Bilirubin 0.0 - 1.2 mg/dL 0.5  0.6    Alkaline Phos 38 - 126 U/L 62  73    AST 15 - 41 U/L 17  20    ALT 0 - 44 U/L 12  13     Lab Results  Component Value Date   TSH 1.270 01/02/2023   No results found for: "CEA1", "CEA" / No results found for: "CEA1", "CEA" No results found for: "PSA1" No results found for: "MWU132" No results found for: "CAN125"  No results found for: "TOTALPROTELP", "ALBUMINELP", "A1GS", "A2GS", "BETS", "BETA2SER", "GAMS", "MSPIKE", "SPEI" Lab Results  Component Value  Date   TIBC 339 03/29/2023   TIBC 312 05/20/2020   TIBC 300 01/13/2020   FERRITIN 20 03/29/2023   FERRITIN 98 05/20/2020   IRONPCTSAT 17 03/29/2023   IRONPCTSAT 26 05/20/2020   IRONPCTSAT 24.3 01/13/2020   No results found for: "LDH"  STUDIES:  MR FOOT LEFT WO CONTRAST Result Date: 04/07/2023 CLINICAL DATA:  Osteonecrosis suspected, foot, xray done EXAM: MRI OF THE LEFT FOOT WITHOUT CONTRAST TECHNIQUE: Multiplanar, multisequence MR imaging of the left forefoot was performed. No intravenous contrast was administered. COMPARISON:  X-ray 04/03/2023 FINDINGS: Bones/Joint/Cartilage Bone marrow edema  and confluent low T1 marrow signal changes throughout the proximal, middle, and distal phalanx of the fourth toe, compatible with acute osteomyelitis. Trace fourth MTP joint effusion. No convincing marrow edema within the fourth metatarsal head. Postsurgical changes to the left foot including prior great toe amputations and partial amputations of the second and third toes. No fracture or dislocation. Mild degenerative changes. Ligaments Intact Lisfranc ligament.  No collateral ligament injury. Muscles and Tendons Denervation changes of the foot musculature and prior amputation changes distally. No tenosynovitis. Soft tissues Soft tissue swelling and edema of the fourth toe. No organized or drainable fluid collections. IMPRESSION: 1. Acute osteomyelitis of the fourth toe involving the proximal, middle, and distal phalanx. 2. Trace fourth MTP joint effusion, which may be reactive or represent septic arthritis. Electronically Signed   By: Duanne Guess D.O.   On: 04/07/2023 19:51   DG Foot Complete Left Result Date: 04/03/2023 Please see detailed radiograph report in office note.  DG Foot Complete Right Result Date: 04/03/2023 Please see detailed radiograph report in office note.    HISTORY:   Past Medical History:  Diagnosis Date   Acute postoperative respiratory insufficiency 04/17/2020   AKI  (acute kidney injury) (HCC) 04/17/2020   Anemia    Anxiety    Arthritis    Blood dyscrasia    ITP   BRCA gene mutation positive    Chronic pain syndrome    Cirrhosis of liver not due to alcohol (HCC)    Complication of anesthesia    Difficulty waking up   Depression    Diabetes mellitus without complication (HCC)    type 2   Diabetic ulcer of toe of left foot associated with type 2 diabetes mellitus, with fat layer exposed (HCC) 03/27/2020   Gastritis    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 No longer has    Past Surgical History:  Procedure Laterality Date   AMPUTATION TOE Bilateral 03/04/2022   Procedure: AMPUTATION TOE RIGHT FOOT PARTIAL OR TOTAL GREAT TOE AND SECOND TOE, LEFT FOOT PARTIAL OR TOTAL GREAT TOE, SECOND AND THIRD TOE;  Surgeon: Pilar Plate, DPM;  Location: [facility] OR;  Service: Podiatry;  Laterality: Bilateral;   AMPUTATION TOE Right 01/04/2023   Procedure: R 1ST TOE AMPUTATION, RIGHT 3RD TOE AMPUTATION;  Surgeon: Pilar Plate, DPM;  Location: [facility];  Service: Orthopedics/Podiatry;  Laterality: Right;   APPENDECTOMY     BACK SURGERY     x 3 lower disc   BILATERAL TOTAL MASTECTOMY WITH AXILLARY LYMPH NODE DISSECTION Bilateral 09/2019   CHOLECYSTECTOMY     nephrolithiasis     ROBOTIC ASSISTED TOTAL HYSTERECTOMY WITH BILATERAL SALPINGO OOPHERECTOMY  Bilateral 06/14/2016   Procedure: ROBOTIC ASSISTED TOTAL HYSTERECTOMY WITH BILATERAL SALPINGO OOPHORECTOMY;  Surgeon: Cleda Mccreedy, MD;  Location: [facility];  Service: Gynecology;  Laterality: Bilateral;    Family History  Problem Relation Age of Onset   Breast cancer Mother        breast   CAD Father    Diabetes Father    Heart failure Father    Cancer Father        renal carcinoma   Kidney failure Father    Kidney cancer Father    Kidney cancer Brother 80       renal carcinoma.   Stroke Paternal Grandmother     Social History:  reports that she has never smoked. She has never used smokeless tobacco. She reports that she does not drink alcohol and does not use drugs.The patient is alone today.  Allergies:  Allergies  Allergen Reactions   Codeine Shortness Of Breath and Other (See Comments)    Other reaction(s): SHOB    Augmentin [Amoxicillin-Pot Clavulanate] Diarrhea   Celebrex [Celecoxib] Other (See Comments)    Unknown reaction   Propranolol Other (See Comments)    Other reaction(s): Unknown   Propranolol Hcl Other (See Comments)    Unknown reaction  ask   Simvastatin Other (See Comments)    Other reaction(s): Unknown   Vytorin [Ezetimibe-Simvastatin] Other (See Comments)    Unknown reaction  Patient is not aware of an allergy to this medication, ask   Zetia [Ezetimibe] Other (See Comments)    Other reaction(s): Unknown    Current Medications: No current facility-administered medications for this visit.   No current outpatient medications on file.   Facility-Administered Medications Ordered in Other Visits  Medication Dose Route Frequency Provider Last Rate Last Admin   [MAR Hold] acetaminophen (TYLENOL) tablet 650 mg  650 mg Oral Q6H PRN Synetta Fail, MD   650 mg at 04/08/23 1814   Or   [MAR Hold] acetaminophen (TYLENOL) suppository 650 mg  650 mg Rectal Q6H PRN Synetta Fail, MD       [MAR Hold] albuterol (PROVENTIL) (2.5 MG/3ML) 0.083%  nebulizer solution 2.5 mg  2.5 mg Inhalation Q6H PRN Synetta Fail, MD   2.5 mg at 04/08/23 1717   amisulpride (BARHEMSYS) injection 10 mg  10 mg Intravenous Once PRN Marcene Duos, MD       [facility] Hold] amitriptyline (ELAVIL) tablet 25 mg  25 mg Oral QHS Synetta Fail, MD   25 mg at 04/08/23 2207   [MAR Hold] atorvastatin (LIPITOR) tablet 10 mg  10 mg Oral QPM Synetta Fail, MD   10 mg at 04/08/23 1717   [MAR Hold] buPROPion (WELLBUTRIN XL) 24 hr tablet 300 mg  300 mg Oral q morning Synetta Fail, MD   300 mg at 04/08/23 0953   [MAR Hold] busPIRone (BUSPAR) tablet 5 mg  5 mg Oral TID Synetta Fail, MD   5 mg at 04/08/23 2207   [MAR Hold] cariprazine (VRAYLAR) capsule 3 mg  3 mg Oral Daily Synetta Fail, MD   3 mg at 04/08/23 0953   [MAR Hold] ceFEPIme (MAXIPIME) 2 g in sodium chloride 0.9 % 100 mL IVPB  2 g Intravenous Q12H Francena Hanly, RPH 200 mL/hr at 04/09/23 0615 2 g at 04/09/23 0615   fentaNYL (SUBLIMAZE) injection 25-50 mcg  25-50 mcg Intravenous Q5 min PRN Marcene Duos, MD   50 mcg at 04/09/23 0855   [MAR Hold] gabapentin (NEURONTIN) capsule 600 mg  600 mg Oral TID Synetta Fail, MD   600 mg at 04/08/23 2207   HYDROmorphone (DILAUDID) injection 1 mg  1 mg Intravenous Q4H PRN Jamse Arn, Jake L, DPM       insulin aspart (novoLOG) injection 0-14 Units  0-14 Units Subcutaneous Q2H PRN Marcene Duos, MD   2 Units at 04/09/23 0725   [MAR Hold] insulin aspart (novoLOG) injection 0-15 Units  0-15 Units Subcutaneous TID WC Synetta Fail, MD   3 Units at 04/08/23 1743   [MAR Hold] insulin aspart (novoLOG) injection 0-5 Units  0-5 Units Subcutaneous QHS Synetta Fail, MD       [facility] Hold] insulin glargine (LANTUS) injection 50 Units  50 Units Subcutaneous QHS Synetta Fail, MD   50 Units at 04/08/23 2230   lactated ringers infusion   Intravenous Continuous Marcene Duos, MD   Stopped at 04/09/23 0904   [MAR Hold]  Levomilnacipran HCl ER CP24 1 capsule  1 capsule Oral QPM Synetta Fail, MD       [facility] Hold] levothyroxine (SYNTHROID) tablet 75 mcg  75 mcg Oral Q0600 Synetta Fail, MD   75 mcg at 04/09/23 0552   [MAR Hold] lidocaine (LIDODERM) 5 % 3 patch  3 patch Transdermal Q24H Synetta Fail, MD       [MAR Hold] lisdexamfetamine (VYVANSE) capsule 70 mg  70 mg Oral QHS Silvana Newness, [facility]       [MAR Hold] losartan (COZAAR) tablet 50 mg  50 mg Oral QPM Synetta Fail, MD   50 mg at 04/08/23 1717   [MAR Hold] metoprolol succinate (TOPROL-XL) 24 hr tablet 12.5 mg  12.5 mg Oral Daily Synetta Fail, MD       [MAR Hold] metroNIDAZOLE (FLAGYL) IVPB 500 mg  500 mg Intravenous Q12H Synetta Fail, MD 100 mL/hr at 04/09/23 0554 500 mg at 04/09/23 0554   [MAR Hold] morphine (MS CONTIN) 12 hr tablet 30 mg  30 mg Oral Q12H Synetta Fail, MD   30 mg at 04/08/23 2206   [MAR Hold] mupirocin ointment (BACTROBAN) 2 % 1 Application  1 Application Nasal BID Synetta Fail, MD   1 Application at 04/08/23 2229   oxyCODONE (Oxy IR/ROXICODONE) immediate release tablet 5 mg  5 mg Oral Q4H PRN Barbaraann Share, DPM       [MAR Hold] pantoprazole (PROTONIX) EC tablet 40 mg  40 mg Oral BID Synetta Fail, MD   40 mg at 04/08/23 2207   [facility] Hold] polyethylene glycol (MIRALAX / GLYCOLAX) packet 17 g  17 g Oral Daily PRN Synetta Fail, MD       [MAR Hold] ramelteon (ROZEREM) tablet 8 mg  8 mg Oral QHS Synetta Fail, MD   8 mg at 04/08/23 2229   [MAR Hold] rOPINIRole (REQUIP) tablet 0.25 mg  0.25 mg Oral QHS Synetta Fail, MD   0.25 mg at 04/08/23 2229   sodium chloride flush (NS) 0.9 % injection 10 mL  10 mL Intracatheter PRN Belva Crome A, PA-C   10 mL at 02/19/21 1004   [MAR Hold] sodium chloride flush (NS) 0.9 % injection 3 mL  3 mL Intravenous Q12H Synetta Fail, MD   3 mL at 04/08/23 2237   [facility] Hold] sorbitol 70 % solution 30 mL  30 mL Oral Daily PRN Synetta Fail, MD       [MAR Hold] vancomycin (VANCOCIN) IVPB 1000 mg/200 mL premix  1,000 mg Intravenous Q24H Ishmael Holter, RPH   Stopped at 04/08/23 2002    I,Jasmine M Lassiter,acting as a Neurosurgeon  for [name], MD.,have documented all relevant documentation on the behalf of [name], MD,as directed by  [name], MD while in the presence of [name], MD.  I have reviewed this report as typed by the medical scribe, and it is complete and accurate.  [name]   04/09/23 9:35 AM

## 2023-03-29 ENCOUNTER — Other Ambulatory Visit: Payer: Self-pay | Admitting: Family Medicine

## 2023-03-29 ENCOUNTER — Encounter: Payer: Self-pay | Admitting: Oncology

## 2023-03-29 ENCOUNTER — Other Ambulatory Visit: Payer: Self-pay | Admitting: Oncology

## 2023-03-29 ENCOUNTER — Inpatient Hospital Stay

## 2023-03-29 ENCOUNTER — Telehealth: Payer: Self-pay | Admitting: Oncology

## 2023-03-29 ENCOUNTER — Inpatient Hospital Stay: Payer: PPO | Attending: Oncology | Admitting: Oncology

## 2023-03-29 ENCOUNTER — Inpatient Hospital Stay: Payer: PPO

## 2023-03-29 VITALS — BP 124/83 | HR 105 | Temp 98.3°F | Resp 18 | Ht 62.0 in | Wt 203.4 lb

## 2023-03-29 DIAGNOSIS — K7460 Unspecified cirrhosis of liver: Secondary | ICD-10-CM | POA: Insufficient documentation

## 2023-03-29 DIAGNOSIS — D638 Anemia in other chronic diseases classified elsewhere: Secondary | ICD-10-CM

## 2023-03-29 DIAGNOSIS — D693 Immune thrombocytopenic purpura: Secondary | ICD-10-CM | POA: Diagnosis not present

## 2023-03-29 DIAGNOSIS — D696 Thrombocytopenia, unspecified: Secondary | ICD-10-CM | POA: Diagnosis not present

## 2023-03-29 DIAGNOSIS — C50312 Malignant neoplasm of lower-inner quadrant of left female breast: Secondary | ICD-10-CM | POA: Insufficient documentation

## 2023-03-29 DIAGNOSIS — Z90710 Acquired absence of both cervix and uterus: Secondary | ICD-10-CM | POA: Insufficient documentation

## 2023-03-29 DIAGNOSIS — Z803 Family history of malignant neoplasm of breast: Secondary | ICD-10-CM | POA: Insufficient documentation

## 2023-03-29 DIAGNOSIS — Z9079 Acquired absence of other genital organ(s): Secondary | ICD-10-CM | POA: Diagnosis not present

## 2023-03-29 DIAGNOSIS — C50311 Malignant neoplasm of lower-inner quadrant of right female breast: Secondary | ICD-10-CM

## 2023-03-29 DIAGNOSIS — K7581 Nonalcoholic steatohepatitis (NASH): Secondary | ICD-10-CM | POA: Diagnosis not present

## 2023-03-29 DIAGNOSIS — Z171 Estrogen receptor negative status [ER-]: Secondary | ICD-10-CM | POA: Diagnosis not present

## 2023-03-29 DIAGNOSIS — Z89421 Acquired absence of other right toe(s): Secondary | ICD-10-CM | POA: Diagnosis not present

## 2023-03-29 DIAGNOSIS — Z1501 Genetic susceptibility to malignant neoplasm of breast: Secondary | ICD-10-CM | POA: Insufficient documentation

## 2023-03-29 DIAGNOSIS — Z853 Personal history of malignant neoplasm of breast: Secondary | ICD-10-CM | POA: Insufficient documentation

## 2023-03-29 DIAGNOSIS — Z1509 Genetic susceptibility to other malignant neoplasm: Secondary | ICD-10-CM

## 2023-03-29 DIAGNOSIS — R911 Solitary pulmonary nodule: Secondary | ICD-10-CM | POA: Insufficient documentation

## 2023-03-29 DIAGNOSIS — E1140 Type 2 diabetes mellitus with diabetic neuropathy, unspecified: Secondary | ICD-10-CM

## 2023-03-29 DIAGNOSIS — Z9013 Acquired absence of bilateral breasts and nipples: Secondary | ICD-10-CM | POA: Insufficient documentation

## 2023-03-29 DIAGNOSIS — R161 Splenomegaly, not elsewhere classified: Secondary | ICD-10-CM | POA: Insufficient documentation

## 2023-03-29 DIAGNOSIS — Z8051 Family history of malignant neoplasm of kidney: Secondary | ICD-10-CM | POA: Diagnosis not present

## 2023-03-29 DIAGNOSIS — Z90722 Acquired absence of ovaries, bilateral: Secondary | ICD-10-CM | POA: Insufficient documentation

## 2023-03-29 DIAGNOSIS — B372 Candidiasis of skin and nail: Secondary | ICD-10-CM

## 2023-03-29 DIAGNOSIS — Z452 Encounter for adjustment and management of vascular access device: Secondary | ICD-10-CM | POA: Insufficient documentation

## 2023-03-29 LAB — CBC WITH DIFFERENTIAL (CANCER CENTER ONLY)
Abs Immature Granulocytes: 0.01 10*3/uL (ref 0.00–0.07)
Basophils Absolute: 0 10*3/uL (ref 0.0–0.1)
Basophils Relative: 1 %
Eosinophils Absolute: 0.1 10*3/uL (ref 0.0–0.5)
Eosinophils Relative: 2 %
HCT: 39.9 % (ref 36.0–46.0)
Hemoglobin: 12.5 g/dL (ref 12.0–15.0)
Immature Granulocytes: 0 %
Lymphocytes Relative: 28 %
Lymphs Abs: 1.5 10*3/uL (ref 0.7–4.0)
MCH: 26 pg (ref 26.0–34.0)
MCHC: 31.3 g/dL (ref 30.0–36.0)
MCV: 83 fL (ref 80.0–100.0)
Monocytes Absolute: 0.2 10*3/uL (ref 0.1–1.0)
Monocytes Relative: 4 %
Neutro Abs: 3.6 10*3/uL (ref 1.7–7.7)
Neutrophils Relative %: 65 %
Platelet Count: 80 10*3/uL — ABNORMAL LOW (ref 150–400)
RBC: 4.81 MIL/uL (ref 3.87–5.11)
RDW: 14.7 % (ref 11.5–15.5)
WBC Count: 5.5 10*3/uL (ref 4.0–10.5)
nRBC: 0 % (ref 0.0–0.2)
nRBC: 0 /100{WBCs}

## 2023-03-29 LAB — VITAMIN B12: Vitamin B-12: 305 pg/mL (ref 180–914)

## 2023-03-29 LAB — IRON AND TIBC
Iron: 57 ug/dL (ref 28–170)
Saturation Ratios: 17 % (ref 10.4–31.8)
TIBC: 339 ug/dL (ref 250–450)
UIBC: 282 ug/dL

## 2023-03-29 LAB — CMP (CANCER CENTER ONLY)
ALT: 17 U/L (ref 0–44)
AST: 26 U/L (ref 15–41)
Albumin: 4 g/dL (ref 3.5–5.0)
Alkaline Phosphatase: 123 U/L (ref 38–126)
Anion gap: 13 (ref 5–15)
BUN: 7 mg/dL (ref 6–20)
CO2: 26 mmol/L (ref 22–32)
Calcium: 9.3 mg/dL (ref 8.9–10.3)
Chloride: 99 mmol/L (ref 98–111)
Creatinine: 1.1 mg/dL — ABNORMAL HIGH (ref 0.44–1.00)
GFR, Estimated: 59 mL/min — ABNORMAL LOW (ref >60)
Glucose, Bld: 348 mg/dL — ABNORMAL HIGH (ref 70–99)
Potassium: 3.9 mmol/L (ref 3.5–5.1)
Sodium: 137 mmol/L (ref 135–145)
Total Bilirubin: 0.5 mg/dL (ref 0.0–1.2)
Total Protein: 6.8 g/dL (ref 6.5–8.1)

## 2023-03-29 LAB — FERRITIN: Ferritin: 20 ng/mL (ref 11–307)

## 2023-03-29 LAB — PROTIME-INR
INR: 1 (ref 0.8–1.2)
Prothrombin Time: 13.1 s (ref 11.4–15.2)

## 2023-03-29 LAB — FOLATE: Folate: 7.9 ng/mL (ref >5.9)

## 2023-03-29 MED ORDER — NYSTATIN 100000 UNIT/GM EX POWD: 1.0000 | Freq: Three times a day (TID) | CUTANEOUS | 0 refills | Status: DC

## 2023-03-29 NOTE — Telephone Encounter (Signed)
 03/29/23 Spoke with patient and confirmed next appt.

## 2023-03-29 NOTE — Patient Instructions (Signed)

## 2023-03-30 LAB — AFP TUMOR MARKER: AFP, Serum, Tumor Marker: 2.9 ng/mL (ref 0.0–9.2)

## 2023-03-31 ENCOUNTER — Ambulatory Visit (INDEPENDENT_AMBULATORY_CARE_PROVIDER_SITE_OTHER): Admitting: Family Medicine

## 2023-03-31 ENCOUNTER — Encounter: Payer: Self-pay | Admitting: Family Medicine

## 2023-03-31 VITALS — BP 108/64 | HR 100 | Temp 99.9°F | Resp 16 | Ht 62.0 in | Wt 204.0 lb

## 2023-03-31 DIAGNOSIS — R252 Cramp and spasm: Secondary | ICD-10-CM | POA: Diagnosis not present

## 2023-03-31 DIAGNOSIS — B372 Candidiasis of skin and nail: Secondary | ICD-10-CM | POA: Diagnosis not present

## 2023-03-31 DIAGNOSIS — E1340 Other specified diabetes mellitus with diabetic neuropathy, unspecified: Secondary | ICD-10-CM

## 2023-03-31 MED ORDER — NYSTATIN 100000 UNIT/GM EX POWD: 1.0000 | Freq: Three times a day (TID) | CUTANEOUS | 0 refills | Status: AC

## 2023-03-31 MED ORDER — GABAPENTIN 600 MG PO TABS: 600.0000 mg | ORAL_TABLET | Freq: Three times a day (TID) | ORAL | 0 refills | Status: DC

## 2023-03-31 NOTE — Progress Notes (Signed)
 Acute Office Visit  Subjective:    Patient ID: Linda [name], female    DOB: 1970-03-15, 53 y.o.   MRN: [id number]  Chief Complaint  Patient presents with   Foot Injury    Legs are sore.    Discussed the use of AI scribe software for clinical note transcription with the patient, who gave verbal consent to proceed.  HPI: The patient presents with leg cramping and high blood sugar levels.  She has been experiencing leg cramping for over a month, which began shortly after her amputation surgery. The cramps have intensified over time, sometimes waking her at night and necessitating stretching. She takes cyclobenzaprine every eight hours, which provides relief, but has not tried using ice for the cramps. She describes soreness in her legs, with one leg being more problematic, and notes that the soreness has been present for a while but has worsened over time.  Her blood sugar levels were recently recorded at 340 mg/dL during lab tests. She has not checked her blood sugar today due to lack of sleep. She attributes her poor blood sugar management to focusing on helping a disabled and elderly neighbor, leading to poor eating habits and increased physical activity. She reports significant stress due to her mother's recent heart attack, which she believes has contributed to her elevated blood sugar levels.  She experiences nerve pain or phantom pain, particularly down the sides of her feet, and has a toe that broke open a few days ago. She takes gabapentin three times a day for nerve pain. Her platelets have been low since undergoing chemotherapy, and she experienced significant bleeding during her last surgery, requiring multiple emergency room visits. She has a history of low platelets and bleeding issues, particularly after her last surgery in December. She is not on any blood thinners and has not been recently checked for blood clots.  She mentions a recent yeast infection for which she  was prescribed nystatin by another doctor. She has used up the initial supply and now has yeast under her belly as well. She is seeking additional nystatin to manage this condition.  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 No longer has    Past Surgical History:  Procedure Laterality Date   AMPUTATION TOE Bilateral 03/04/2022   Procedure: AMPUTATION TOE RIGHT FOOT PARTIAL OR TOTAL GREAT TOE AND SECOND TOE, LEFT FOOT PARTIAL OR TOTAL GREAT TOE, SECOND AND THIRD TOE;  Surgeon: Pilar Plate, DPM;  Location: [facility] OR;  Service: Podiatry;  Laterality: Bilateral;   AMPUTATION TOE Right 01/04/2023   Procedure: R 1ST TOE AMPUTATION, RIGHT 3RD TOE AMPUTATION;  Surgeon: Pilar Plate, DPM;  Location: [facility] ORS;  Service:  Orthopedics/Podiatry;  Laterality: Right;   APPENDECTOMY     BACK SURGERY     x 3 lower disc   BILATERAL TOTAL MASTECTOMY WITH AXILLARY LYMPH NODE DISSECTION Bilateral 09/2019   CHOLECYSTECTOMY     nephrolithiasis     ROBOTIC ASSISTED TOTAL HYSTERECTOMY WITH BILATERAL SALPINGO OOPHERECTOMY Bilateral 06/14/2016   Procedure: ROBOTIC ASSISTED TOTAL HYSTERECTOMY WITH BILATERAL SALPINGO OOPHORECTOMY;  Surgeon: Cleda Mccreedy, MD;  Location: [facility] OR[facility];  Service: Gynecology;  Laterality: Bilateral;    Family History  Problem Relation Age of Onset   Breast cancer Mother        breast   CAD Father    Diabetes Father    Heart failure Father    Cancer Father        renal carcinoma   Kidney failure Father    Kidney cancer Father    Kidney cancer Brother 68       renal carcinoma.   Stroke Paternal Grandmother     Social History   Socioeconomic History   Marital status: Divorced    Spouse name: Not on file   Number of children: Not on file   Years of education: Not on file   Highest education level: Associate degree: academic program  Occupational History   Occupation: disabled  Tobacco Use   Smoking status: Never   Smokeless tobacco: Never  Vaping Use   Vaping status: Never Used  Substance and Sexual Activity   Alcohol use: No   Drug use: No   Sexual activity: Yes  Other Topics Concern   Not on file  Social History Narrative   wears sunscreen, brushes and flosses daily, see's dentist bi-annually, has smoke/carbon monoxide detectors, wears a seatbelt and practices gun safety   Social Drivers of Health   Financial Resource Strain: High Risk (01/21/2023)   Overall Financial Resource Strain (CARDIA)    Difficulty of Paying Living Expenses: Very hard  Food Insecurity: Food Insecurity Present (01/21/2023)   Hunger Vital Sign    Worried About Running Out of Food in the Last Year: Sometimes true    Ran Out of Food in the Last Year: Often true  Transportation Needs: No Transportation  Needs (01/21/2023)   PRAPARE - Administrator, Civil Service (Medical): No    Lack of Transportation (Non-Medical): No  Physical Activity: Inactive (01/21/2023)   Exercise Vital Sign    Days of Exercise per Week: 0 days    Minutes of Exercise per Session: 0 min  Stress: Stress Concern Present (01/21/2023)   [employer] of Occupational Health - Occupational Stress Questionnaire    Feeling of Stress : Very much  Social Connections: Moderately Isolated (01/21/2023)   Social Connection and Isolation Panel [NHANES]    Frequency of Communication with Friends and Family: More than three times a week    Frequency of Social Gatherings with Friends and Family: Never    Attends Religious Services:  1 to 4 times per year    Active Member of Clubs or Organizations: No    Attends Banker Meetings: Not on file    Marital Status: Divorced  Intimate Partner Violence: Not At Risk (02/02/2022)   Humiliation, Afraid, Rape, and Kick questionnaire    Fear of Current or Ex-Partner: No    Emotionally Abused: No    Physically Abused: No    Sexually Abused: No    Outpatient Medications Prior to Visit  Medication Sig Dispense Refill   acetaminophen (TYLENOL) 500 MG tablet Take 500 mg by mouth every 6 (six) hours as needed for moderate pain or mild pain.     albuterol (VENTOLIN HFA) 108 (90 Base) MCG/ACT inhaler INHALE TWO PUFFS BY MOUTH INTO LUNGS every SIX hours orn FOR WHEEZING AND/OR SHORTNESS OF BREATH 8.5 g 1   amitriptyline (ELAVIL) 25 MG tablet TAKE ONE TABLET BY MOUTH AT BEDTIME 30 tablet 5   atorvastatin (LIPITOR) 10 MG tablet TAKE ONE TABLET BY MOUTH EVERY EVENING 90 tablet 1   Blood Glucose Monitoring Suppl (ONETOUCH VERIO REFLECT) w/Device KIT AS DIRECTED     Blood Pressure Monitoring (SPHYGMOMANOMETER) MISC 1 each by Does not apply route daily in the afternoon. 1 each 0   buPROPion (WELLBUTRIN XL) 300 MG 24 hr tablet Take 1 tablet (300 mg total) by mouth every  morning. 90 tablet 3   busPIRone (BUSPAR) 5 MG tablet TAKE ONE TABLET BY MOUTH 3 TIMES DAILY 90 tablet 3   Continuous Blood Gluc Receiver (FREESTYLE LIBRE 2 READER) DEVI E11.69 Check blood sugar 4 times daily as directed 1 each 0   Continuous Glucose Sensor (FREESTYLE LIBRE 3 PLUS SENSOR) MISC Change sensor every 15 days. 6 each 1   cyclobenzaprine (FLEXERIL) 10 MG tablet TAKE ONE TABLET BY MOUTH EVERY 8 HOURS AS NEEDED FOR MUSCLE SPASMS 270 tablet 1   dicyclomine (BENTYL) 20 MG tablet TAKE ONE TABLET BY MOUTH BEFORE MEALS AND AT BEDTIME AS NEEDED FOR STOMACH CRAMPING 180 tablet 1   ferrous sulfate 325 (65 FE) MG tablet Take 325 mg by mouth every evening.     FETZIMA 80 MG CP24 TAKE ONE CAPSULE BY MOUTH EVERY EVENING 90 capsule 1   gabapentin (NEURONTIN) 400 MG capsule TAKE 2 CAPSULES BY MOUTH 3 TIMES DAILY 180 capsule 1   glucose blood test strip Use as instructed to check FBS daily. E11.40 100 each 12   Insulin Pen Needle (BD PEN NEEDLE NANO U/F) 32G X 4 MM MISC Use new needle with each injection. 30 each 2   Lancets (ONETOUCH DELICA PLUS LANCET30G) MISC USE TO check blood glucose 2-3 times daily AS DIRECTED 100 each 3   levothyroxine (SYNTHROID) 75 MCG tablet Take 1 tablet (75 mcg total) by mouth daily before breakfast. 90 tablet 3   lidocaine (LIDODERM) 5 % Place 3 patches onto the skin daily. Remove & Discard patch within 12 hours or as directed by MD 90 patch 0   losartan (COZAAR) 50 MG tablet TAKE ONE TABLET BY MOUTH EVERY EVENING 90 tablet 1   Magnesium 500 MG CAPS Take 1 capsule (500 mg total) by mouth daily. 90 capsule 1   metoprolol succinate (TOPROL-XL) 25 MG 24 hr tablet Take 0.5 tablets (12.5 mg total) by mouth daily. 45 tablet 0   morphine (MS CONTIN) 30 MG 12 hr tablet Take 1 tablet (30 mg total) by mouth every 12 (twelve) hours. 60 tablet 0   MOUNJARO 7.5 MG/0.5ML Pen INJECT 7.5  MG INTO THE SKIN EVERY WEEK 2 mL 1   Multiple Vitamin (MULTIVITAMIN WITH MINERALS) TABS tablet Take 1  tablet by mouth daily. Solar ray     mupirocin ointment (BACTROBAN) 2 % Apply 1 Application topically daily. 22 g 0   ondansetron (ZOFRAN) 4 MG tablet Take 1 tablet (4 mg total) by mouth every 4 (four) hours as needed for nausea. 90 tablet 3   OVER THE COUNTER MEDICATION Take 1 tablet by mouth in the morning and at bedtime. Lutein for eyes     pantoprazole (PROTONIX) 40 MG tablet TAKE ONE TABLET BY MOUTH TWICE DAILY 180 tablet 0   polyethylene glycol (MIRALAX / GLYCOLAX) 17 g packet Take 17 g by mouth daily as needed for moderate constipation.     potassium chloride (MICRO-K) 10 MEQ CR capsule Take 2 capsules (20 mEq total) by mouth 2 (two) times daily. 360 capsule 3   Probiotic Product (PROBIOTIC PO) Take 1 capsule by mouth in the morning. 3.2 billion cfu     prochlorperazine (COMPAZINE) 5 MG tablet TAKE ONE TABLET BY MOUTH every SIX hours AS NEEDED FOR NAUSEA AND VOMITING 30 tablet 1   ramelteon (ROZEREM) 8 MG tablet TAKE ONE TABLET BY MOUTH AT BEDTIME 90 tablet 1   rOPINIRole (REQUIP) 0.25 MG tablet TAKE ONE TABLET BY MOUTH AT BEDTIME 90 tablet 1   SYNJARDY 12.05-998 MG TABS TAKE ONE TABLET BY MOUTH TWICE DAILY 60 tablet 1   TRESIBA FLEXTOUCH 200 UNIT/ML FlexTouch Pen INJECT 60 UNITS SUBCUTANEOUSLY DAILY 9 mL 3   Vitamin D, Ergocalciferol, (DRISDOL) 1.25 MG (50000 UNIT) CAPS capsule TAKE ONE CAPSULE BY MOUTH EVERY FRIDAY AT 8AM 12 capsule 1   VRAYLAR 3 MG capsule TAKE ONE CAPSULE BY MOUTH EVERY DAY 90 capsule 0   VYVANSE 70 MG capsule TAKE ONE CAPSULE BY MOUTH EVERY DAY 30 capsule 0   nystatin (MYCOSTATIN/NYSTOP) powder Apply 1 Application topically 3 (three) times daily. 15 g 0   Facility-Administered Medications Prior to Visit  Medication Dose Route Frequency Provider Last Rate Last Admin   sodium chloride flush (NS) 0.9 % injection 10 mL  10 mL Intracatheter PRN Mosher, Kelli A, PA-C   10 mL at 02/19/21 1004    Allergies  Allergen Reactions   Codeine Shortness Of Breath and Other (See  Comments)    Other reaction(s): SHOB    Augmentin [Amoxicillin-Pot Clavulanate] Diarrhea   Celebrex [Celecoxib] Other (See Comments)    Unknown reaction   Propranolol Other (See Comments)    Other reaction(s): Unknown   Propranolol Hcl Other (See Comments)    Unknown reaction  ask   Simvastatin Other (See Comments)    Other reaction(s): Unknown   Vytorin [Ezetimibe-Simvastatin] Other (See Comments)    Unknown reaction  Patient is not aware of an allergy to this medication, ask   Zetia [Ezetimibe] Other (See Comments)    Other reaction(s): Unknown    Review of Systems  Constitutional:  Negative for chills, diaphoresis, fatigue and fever.  HENT:  Negative for congestion, ear pain and sinus pain.   Eyes: Negative.   Respiratory:  Negative for cough and shortness of breath.   Cardiovascular:  Negative for chest pain.  Gastrointestinal:  Negative for abdominal pain, constipation, nausea and vomiting.  Endocrine: Negative.   Genitourinary:  Negative for dysuria.  Musculoskeletal:  Negative for arthralgias.  Allergic/Immunologic: Negative.   Neurological:  Negative for weakness and headaches.  Psychiatric/Behavioral:  Negative for dysphoric mood. The patient is not nervous/anxious.  Objective:        03/31/2023   11:15 AM 03/29/2023   11:25 AM 01/24/2023    7:22 AM  Vitals with BMI  Height 5\' 2"  5\' 2"  5\' 2"   Weight 204 lbs 203 lbs 6 oz 207 lbs  BMI 37.3 37.19 37.85  Systolic 108 124 409  Diastolic 64 83 60  Pulse 100 105 101    No data found.   Physical Exam Vitals reviewed.  Constitutional:      General: She is not in acute distress.    Appearance: Normal appearance.  Neck:     Vascular: No carotid bruit.  Cardiovascular:     Rate and Rhythm: Normal rate and regular rhythm.     Heart sounds: Normal heart sounds.  Pulmonary:     Effort: Pulmonary effort is normal.     Breath sounds: Normal breath sounds.  Abdominal:     General: Bowel sounds are  normal.     Palpations: Abdomen is soft.     Tenderness: There is no abdominal tenderness.  Neurological:     Mental Status: She is alert. Mental status is at baseline.  Psychiatric:        Mood and Affect: Mood normal.        Behavior: Behavior normal.     Health Maintenance Due  Topic Date Due   Zoster Vaccines- Shingrix (1 of 2) Never done   Pneumococcal Vaccine 75-53 Years old (3 of 3 - PCV) 05/06/2014   OPHTHALMOLOGY EXAM  05/28/2022   COVID-19 Vaccine (6 - 2024-25 season) 09/25/2022   FOOT EXAM  01/07/2023    There are no preventive care reminders to display for this patient.   Lab Results  Component Value Date   TSH 1.270 01/02/2023   Lab Results  Component Value Date   WBC 5.5 03/29/2023   HGB 12.5 03/29/2023   HCT 39.9 03/29/2023   MCV 83.0 03/29/2023   PLT 80 (L) 03/29/2023   Lab Results  Component Value Date   NA 137 03/29/2023   K 3.9 03/29/2023   CO2 26 03/29/2023   GLUCOSE 348 (H) 03/29/2023   BUN 7 03/29/2023   CREATININE 1.10 (H) 03/29/2023   BILITOT 0.5 03/29/2023   ALKPHOS 123 03/29/2023   AST 26 03/29/2023   ALT 17 03/29/2023   PROT 6.8 03/29/2023   ALBUMIN 4.0 03/29/2023   CALCIUM 9.3 03/29/2023   ANIONGAP 13 03/29/2023   EGFR 60.0 09/19/2022   Lab Results  Component Value Date   CHOL 156 01/24/2023   Lab Results  Component Value Date   HDL 43 01/24/2023   Lab Results  Component Value Date   LDLCALC 84 01/24/2023   Lab Results  Component Value Date   TRIG 169 (H) 01/24/2023   Lab Results  Component Value Date   CHOLHDL 3.6 01/24/2023   Lab Results  Component Value Date   HGBA1C 6.7 (H) 12/21/2022       Assessment & Plan:  Bilateral leg cramps Assessment & Plan: Chronic leg cramps possibly related to foot amputation and hyperglycemia. Differential includes abnormal foot structure, fluid imbalance, and B12 deficiency. - Check B12 levels. - Advise icing the affected area. - Continue gabapentin and  cyclobenzaprine. - Advise reducing sugar intake. - Follow up with foot doctor on Monday.   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 Gabapentin is used for pain management. - Prescribe gabapentin 600 mg three times a day for two weeks, then taper down. - Follow up with foot doctor to assess for further causes of neuropathy.  Orders: -     Gabapentin; Take 1 tablet (600 mg total) by mouth 3 (three) times daily for 14 days.  Dispense: 42 tablet; Refill: 0  Skin yeast infection Assessment & Plan: Recurrent yeast infection spread to area under the belly. - Prescribe nystatin three times a day as needed.  Orders: -     Nystatin; Apply 1 Application topically 3 (three) times daily.  Dispense: 15 g; Refill: 0    Meds ordered this encounter  Medications   nystatin (MYCOSTATIN/NYSTOP) powder    Sig: Apply 1 Application topically 3 (three) times daily.    Dispense:  15 g    Refill:  0   gabapentin (NEURONTIN) 600 MG tablet    Sig: Take 1 tablet (600 mg total) by mouth 3 (three) times daily for 14 days.    Dispense:  42 tablet    Refill:  0    Orders Placed This Encounter  Procedures   B12 and Folate Panel     Follow-up: Return if symptoms worsen or fail to improve.  An After Visit Summary was printed and given to the patient.  Total time spent on today's visit was  35 minutes, including both face-to-face time and nonface-to-face time personally spent on review of chart (labs and imaging), discussing labs and goals, discussing further work-up, treatment options, referrals to specialist if needed, reviewing outside records if pertinent, answering patient's questions, and coordinating care.    Lajuana Matte, FNP [name] [phone]

## 2023-03-31 NOTE — Assessment & Plan Note (Addendum)
 Recurrent yeast infection spread to area under the belly. - Prescribe nystatin three times a day as needed.

## 2023-03-31 NOTE — Assessment & Plan Note (Signed)
 Chronic leg cramps possibly related to foot amputation and hyperglycemia. Differential includes abnormal foot structure, fluid imbalance, and B12 deficiency. - Check B12 levels. - Advise icing the affected area. - Continue gabapentin and cyclobenzaprine. - Advise reducing sugar intake. - Follow up with foot doctor on Monday.

## 2023-03-31 NOTE — Assessment & Plan Note (Signed)
 Nerve pain possibly related to diabetes or phantom pain from amputation. Gabapentin is used for pain management. - Prescribe gabapentin 600 mg three times a day for two weeks, then taper down. - Follow up with foot doctor to assess for further causes of neuropathy.

## 2023-04-01 LAB — B12 AND FOLATE PANEL
Folate: 9.2 ng/mL (ref >3.0)
Vitamin B-12: 509 pg/mL (ref 232–1245)

## 2023-04-03 ENCOUNTER — Encounter: Payer: Self-pay | Admitting: Podiatry

## 2023-04-03 ENCOUNTER — Ambulatory Visit: Payer: PPO | Admitting: Podiatry

## 2023-04-03 ENCOUNTER — Ambulatory Visit (INDEPENDENT_AMBULATORY_CARE_PROVIDER_SITE_OTHER)

## 2023-04-03 DIAGNOSIS — L97523 Non-pressure chronic ulcer of other part of left foot with necrosis of muscle: Secondary | ICD-10-CM

## 2023-04-03 DIAGNOSIS — L02612 Cutaneous abscess of left foot: Secondary | ICD-10-CM

## 2023-04-03 DIAGNOSIS — L03032 Cellulitis of left toe: Secondary | ICD-10-CM

## 2023-04-03 DIAGNOSIS — L97512 Non-pressure chronic ulcer of other part of right foot with fat layer exposed: Secondary | ICD-10-CM

## 2023-04-03 DIAGNOSIS — L97522 Non-pressure chronic ulcer of other part of left foot with fat layer exposed: Secondary | ICD-10-CM | POA: Diagnosis not present

## 2023-04-03 DIAGNOSIS — L080 Pyoderma: Secondary | ICD-10-CM | POA: Diagnosis not present

## 2023-04-03 DIAGNOSIS — E11621 Type 2 diabetes mellitus with foot ulcer: Secondary | ICD-10-CM

## 2023-04-03 MED ORDER — DOXYCYCLINE HYCLATE 100 MG PO TABS: 100.0000 mg | ORAL_TABLET | Freq: Two times a day (BID) | ORAL | 0 refills | Status: AC

## 2023-04-03 MED ORDER — TOLNAFTATE 1 % EX POWD: 1.0000 | Freq: Two times a day (BID) | CUTANEOUS | 0 refills | Status: AC

## 2023-04-03 NOTE — Progress Notes (Unsigned)
 Chief Complaint  Linda [name] presents with   Diabetic Ulcer    Left 3rd toe wound is worse, the open area has spread. It is swollen and purple. Right 3rd is now doing the same thing the left one is doing.  Home health told her to let Dr. Jamse Arn know that the underside of the toe stays "damp" and wants to know if that is a contributing factor to not healing. Last A1c  not on file but surger has been high.     HPI: 53 y.o. female following up for left fourth toe wound associated with fourth and fifth toe hammertoe.  The left fourth toe has significantly worsened.  Linda [name] has noticed increased drainage and swelling to the left fourth toe.  She states that her blood sugars have been running high.  She states that home health has noticed more moisture and drainage when they have come out.  There is also new similar ulceration to the right fourth toe plantar sulcus since last visit.  Denies any nausea, vomiting, fever, chills, chest pain, shortness of breath.  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 No longer has    Past Surgical History:  Procedure Laterality Date   AMPUTATION TOE Bilateral 03/04/2022   Procedure: AMPUTATION TOE RIGHT FOOT PARTIAL OR TOTAL GREAT TOE AND SECOND TOE, LEFT FOOT PARTIAL OR TOTAL GREAT TOE, SECOND AND THIRD TOE;  Surgeon: Pilar Plate, DPM;  Location: [facility];  Service: Podiatry;  Laterality: Bilateral;   AMPUTATION TOE Right 01/04/2023   Procedure: R 1ST TOE AMPUTATION, RIGHT 3RD TOE AMPUTATION;  Surgeon: Pilar Plate, DPM;  Location: [facility];  Service: Orthopedics/Podiatry;  Laterality: Right;   APPENDECTOMY     BACK SURGERY     x 3 lower disc   BILATERAL TOTAL MASTECTOMY WITH AXILLARY LYMPH NODE DISSECTION Bilateral 09/2019   CHOLECYSTECTOMY     nephrolithiasis     ROBOTIC ASSISTED TOTAL HYSTERECTOMY WITH BILATERAL SALPINGO OOPHERECTOMY Bilateral 06/14/2016   Procedure: ROBOTIC ASSISTED TOTAL HYSTERECTOMY WITH BILATERAL SALPINGO OOPHORECTOMY;  Surgeon: Cleda Mccreedy, MD;  Location: [facility];  Service: Gynecology;  Laterality: Bilateral;    Allergies  Allergen Reactions   Codeine Shortness Of Breath and Other (See Comments)    Other reaction(s): SHOB    Augmentin [Amoxicillin-Pot Clavulanate] Diarrhea   Celebrex [Celecoxib] Other (See Comments)    Unknown reaction   Propranolol Other (See Comments)    Other reaction(s): Unknown   Propranolol Hcl Other (See Comments)    Unknown reaction  ask   Simvastatin Other (See Comments)    Other reaction(s): Unknown   Vytorin [Ezetimibe-Simvastatin] Other (See Comments)    Unknown reaction  Linda [name] is not aware of an allergy to this medication, ask   Zetia [Ezetimibe] Other (See Comments)    Other reaction(s): Unknown     ROS negative except as stated in HPI   Physical Exam: There were no vitals filed for this visit.  General: The Linda [name] is alert and oriented x3 in no acute distress.  Dermatology: Left fourth toe plantar sulcus ulceration measuring 2.2 x 1.2 x 0.4 cm with exposed flexor tendon that does track proximally along the tendon approximately half a centimeter.  Maceration to the wound edges.  Erythema and edema to the toe.  No frank purulence.  No malodor.  Postdebridement measurements 2.4 x 1.4 x 0.5 with healthy bleeding wound bed.    Right fourth toe plantar sulcus ulceration with fat layer exposed measuring 1 x 0.3 x 0.2 cm.  No signs of infection here.  Postdebridement measurements 1.2 x 0.4 x 0.3 cm.  Granulation tissue to wound bed.       Vascular: Palpable pedal pulses bilaterally. Capillary refill within normal limits.  Localized erythema and edema to the left fourth toe.  Does not ascend.  Neurological: Protective sensation grossly diminished bilaterally.  Musculoskeletal Exam: Status post right foot first, second, third toe amputation, left foot history of first, second, third amputation with hammertoe contractures of the remaining digits that are partially reducible.  Radiographs: Left and right foot 04/03/2023 Prior amputations of first, second, third toes bilaterally.  No soft tissue emphysema.  No osteolysis or cortical erosions bilaterally.  Increased soft tissue swelling of the left fourth toe.  Hammertoe contractures of the remaining digits.   Assessment/Plan of Care: 1. Diabetic ulcer of toe of left foot associated with type 2 diabetes mellitus, with necrosis of muscle (HCC)   2. Skin ulcer of third toe of right foot with fat layer exposed (HCC)   3. Cellulitis and abscess of toe of left foot      Meds ordered this encounter  Medications   doxycycline (VIBRA-TABS) 100 MG tablet    Sig: Take 1 tablet (100 mg total) by mouth 2 (two) times daily for 10 days.     Dispense:  20 tablet    Refill:  0   tolnaftate (TINACTIN) 1 % powder    Sig: Apply 1 Application topically 2 (two) times daily for 14 days.    Dispense:  28 g    Refill:  0   Linda [name]  Discussed clinical findings with Linda [name] today.  Plan: - Verbal consent obtained perform excisional debridement using #15 blade and tissue nipper as described above.  Pre and postdebridement measurements as described above.  Left fourth toe debridement involving all nonviable skin, subcutaneous tissue and muscle fascia.  Right fourth toe excisional debridement of nonviable skin and subcutaneous tissue.  No anesthesia required secondary to neuropathy.  Hemostasis achieved with silver nitrate to left fourth toe and compression. -Left fourth toe wound culture obtained.  Starting  oral doxycycline for now.  May use some Aftate powder on the right fourth toe to limit any excess moisture. - Betadine wet-to-dry dressings applied.  Linda [name] to change dressings daily. - Advised Linda [name] to practice limited stance and gait is much as possible while she recovers, importance of minimizing weightbearing as much as possible was stressed with Linda [name].  -Close monitoring due to significant weight worse appearance of the left fourth toe, will have Linda [name] follow-up in 1 week.  Linda [name] to go to emergency room if signs and symptoms of infection worsen or if she begins to feel sick. -Linda [name] understands that she is at high risk of amputation.   Mathilda Maguire L. Marchia Bond, AACFAS [facility]     [address] [place], [place] [address]                Office [phone]  Fax [phone]

## 2023-04-06 ENCOUNTER — Ambulatory Visit (INDEPENDENT_AMBULATORY_CARE_PROVIDER_SITE_OTHER)
Admission: RE | Admit: 2023-04-06 | Discharge: 2023-04-06 | Disposition: A | Discharge status: Home / Self Care | Source: Ambulatory Visit | Attending: Oncology | Admitting: Oncology

## 2023-04-06 DIAGNOSIS — K7460 Unspecified cirrhosis of liver: Secondary | ICD-10-CM

## 2023-04-06 DIAGNOSIS — Z9049 Acquired absence of other specified parts of digestive tract: Secondary | ICD-10-CM | POA: Diagnosis not present

## 2023-04-06 DIAGNOSIS — K7689 Other specified diseases of liver: Secondary | ICD-10-CM | POA: Diagnosis not present

## 2023-04-07 ENCOUNTER — Ambulatory Visit: Admitting: Podiatry

## 2023-04-07 ENCOUNTER — Encounter (HOSPITAL_COMMUNITY): Payer: Self-pay | Admitting: Emergency Medicine

## 2023-04-07 ENCOUNTER — Inpatient Hospital Stay (HOSPITAL_COMMUNITY)
Admission: EM | Admit: 2023-04-07 | Discharge: 2023-04-12 | DRG: 617 | Disposition: A | Discharge status: Home Health | Source: Ambulatory Visit | Attending: Internal Medicine | Admitting: Internal Medicine

## 2023-04-07 ENCOUNTER — Telehealth: Payer: Self-pay | Admitting: *Deleted

## 2023-04-07 ENCOUNTER — Other Ambulatory Visit: Payer: Self-pay

## 2023-04-07 ENCOUNTER — Emergency Department (HOSPITAL_COMMUNITY)

## 2023-04-07 ENCOUNTER — Other Ambulatory Visit: Payer: Self-pay | Admitting: *Deleted

## 2023-04-07 VITALS — BP 125/76 | HR 97 | Temp 99.7°F

## 2023-04-07 DIAGNOSIS — Z841 Family history of disorders of kidney and ureter: Secondary | ICD-10-CM

## 2023-04-07 DIAGNOSIS — E782 Mixed hyperlipidemia: Secondary | ICD-10-CM | POA: Diagnosis not present

## 2023-04-07 DIAGNOSIS — F330 Major depressive disorder, recurrent, mild: Secondary | ICD-10-CM | POA: Diagnosis present

## 2023-04-07 DIAGNOSIS — E118 Type 2 diabetes mellitus with unspecified complications: Secondary | ICD-10-CM | POA: Diagnosis not present

## 2023-04-07 DIAGNOSIS — Z79899 Other long term (current) drug therapy: Secondary | ICD-10-CM | POA: Diagnosis not present

## 2023-04-07 DIAGNOSIS — I10 Essential (primary) hypertension: Secondary | ICD-10-CM | POA: Diagnosis not present

## 2023-04-07 DIAGNOSIS — K7460 Unspecified cirrhosis of liver: Secondary | ICD-10-CM | POA: Diagnosis not present

## 2023-04-07 DIAGNOSIS — Z89421 Acquired absence of other right toe(s): Secondary | ICD-10-CM

## 2023-04-07 DIAGNOSIS — E11621 Type 2 diabetes mellitus with foot ulcer: Secondary | ICD-10-CM | POA: Diagnosis not present

## 2023-04-07 DIAGNOSIS — Z853 Personal history of malignant neoplasm of breast: Secondary | ICD-10-CM

## 2023-04-07 DIAGNOSIS — Z90722 Acquired absence of ovaries, bilateral: Secondary | ICD-10-CM

## 2023-04-07 DIAGNOSIS — E66812 Obesity, class 2: Secondary | ICD-10-CM | POA: Diagnosis not present

## 2023-04-07 DIAGNOSIS — B961 Klebsiella pneumoniae [K. pneumoniae] as the cause of diseases classified elsewhere: Secondary | ICD-10-CM | POA: Diagnosis not present

## 2023-04-07 DIAGNOSIS — Z823 Family history of stroke: Secondary | ICD-10-CM

## 2023-04-07 DIAGNOSIS — Z803 Family history of malignant neoplasm of breast: Secondary | ICD-10-CM

## 2023-04-07 DIAGNOSIS — Z87442 Personal history of urinary calculi: Secondary | ICD-10-CM

## 2023-04-07 DIAGNOSIS — F339 Major depressive disorder, recurrent, unspecified: Secondary | ICD-10-CM | POA: Diagnosis present

## 2023-04-07 DIAGNOSIS — Z23 Encounter for immunization: Secondary | ICD-10-CM

## 2023-04-07 DIAGNOSIS — B372 Candidiasis of skin and nail: Secondary | ICD-10-CM | POA: Diagnosis not present

## 2023-04-07 DIAGNOSIS — Z90710 Acquired absence of both cervix and uterus: Secondary | ICD-10-CM

## 2023-04-07 DIAGNOSIS — G894 Chronic pain syndrome: Secondary | ICD-10-CM | POA: Diagnosis not present

## 2023-04-07 DIAGNOSIS — E1340 Other specified diabetes mellitus with diabetic neuropathy, unspecified: Secondary | ICD-10-CM | POA: Diagnosis present

## 2023-04-07 DIAGNOSIS — D649 Anemia, unspecified: Secondary | ICD-10-CM | POA: Diagnosis present

## 2023-04-07 DIAGNOSIS — F333 Major depressive disorder, recurrent, severe with psychotic symptoms: Secondary | ICD-10-CM | POA: Diagnosis present

## 2023-04-07 DIAGNOSIS — M86172 Other acute osteomyelitis, left ankle and foot: Secondary | ICD-10-CM | POA: Diagnosis present

## 2023-04-07 DIAGNOSIS — K7581 Nonalcoholic steatohepatitis (NASH): Secondary | ICD-10-CM | POA: Diagnosis not present

## 2023-04-07 DIAGNOSIS — D638 Anemia in other chronic diseases classified elsewhere: Secondary | ICD-10-CM

## 2023-04-07 DIAGNOSIS — L97522 Non-pressure chronic ulcer of other part of left foot with fat layer exposed: Secondary | ICD-10-CM | POA: Diagnosis not present

## 2023-04-07 DIAGNOSIS — Z5189 Encounter for other specified aftercare: Secondary | ICD-10-CM | POA: Diagnosis not present

## 2023-04-07 DIAGNOSIS — B952 Enterococcus as the cause of diseases classified elsewhere: Secondary | ICD-10-CM | POA: Diagnosis not present

## 2023-04-07 DIAGNOSIS — E1169 Type 2 diabetes mellitus with other specified complication: Principal | ICD-10-CM | POA: Diagnosis present

## 2023-04-07 DIAGNOSIS — Z1502 Genetic susceptibility to malignant neoplasm of ovary: Secondary | ICD-10-CM

## 2023-04-07 DIAGNOSIS — Z89432 Acquired absence of left foot: Secondary | ICD-10-CM | POA: Diagnosis not present

## 2023-04-07 DIAGNOSIS — R252 Cramp and spasm: Secondary | ICD-10-CM | POA: Diagnosis not present

## 2023-04-07 DIAGNOSIS — L97509 Non-pressure chronic ulcer of other part of unspecified foot with unspecified severity: Secondary | ICD-10-CM | POA: Diagnosis not present

## 2023-04-07 DIAGNOSIS — Z7985 Long-term (current) use of injectable non-insulin antidiabetic drugs: Secondary | ICD-10-CM | POA: Diagnosis not present

## 2023-04-07 DIAGNOSIS — D6959 Other secondary thrombocytopenia: Secondary | ICD-10-CM | POA: Diagnosis not present

## 2023-04-07 DIAGNOSIS — G2581 Restless legs syndrome: Secondary | ICD-10-CM | POA: Diagnosis not present

## 2023-04-07 DIAGNOSIS — Z833 Family history of diabetes mellitus: Secondary | ICD-10-CM

## 2023-04-07 DIAGNOSIS — E039 Hypothyroidism, unspecified: Secondary | ICD-10-CM | POA: Diagnosis not present

## 2023-04-07 DIAGNOSIS — Z6838 Body mass index (BMI) 38.0-38.9, adult: Secondary | ICD-10-CM

## 2023-04-07 DIAGNOSIS — Z862 Personal history of diseases of the blood and blood-forming organs and certain disorders involving the immune mechanism: Secondary | ICD-10-CM

## 2023-04-07 DIAGNOSIS — M7732 Calcaneal spur, left foot: Secondary | ICD-10-CM | POA: Diagnosis not present

## 2023-04-07 DIAGNOSIS — L97523 Non-pressure chronic ulcer of other part of left foot with necrosis of muscle: Secondary | ICD-10-CM | POA: Diagnosis not present

## 2023-04-07 DIAGNOSIS — Z4801 Encounter for change or removal of surgical wound dressing: Secondary | ICD-10-CM | POA: Diagnosis not present

## 2023-04-07 DIAGNOSIS — E1140 Type 2 diabetes mellitus with diabetic neuropathy, unspecified: Secondary | ICD-10-CM | POA: Diagnosis present

## 2023-04-07 DIAGNOSIS — Z9013 Acquired absence of bilateral breasts and nipples: Secondary | ICD-10-CM

## 2023-04-07 DIAGNOSIS — M86672 Other chronic osteomyelitis, left ankle and foot: Secondary | ICD-10-CM | POA: Diagnosis not present

## 2023-04-07 DIAGNOSIS — M19072 Primary osteoarthritis, left ankle and foot: Secondary | ICD-10-CM | POA: Diagnosis not present

## 2023-04-07 DIAGNOSIS — Z8249 Family history of ischemic heart disease and other diseases of the circulatory system: Secondary | ICD-10-CM | POA: Diagnosis not present

## 2023-04-07 DIAGNOSIS — C50311 Malignant neoplasm of lower-inner quadrant of right female breast: Secondary | ICD-10-CM

## 2023-04-07 DIAGNOSIS — E1165 Type 2 diabetes mellitus with hyperglycemia: Secondary | ICD-10-CM | POA: Diagnosis present

## 2023-04-07 DIAGNOSIS — K219 Gastro-esophageal reflux disease without esophagitis: Secondary | ICD-10-CM | POA: Diagnosis not present

## 2023-04-07 DIAGNOSIS — Z885 Allergy status to narcotic agent status: Secondary | ICD-10-CM

## 2023-04-07 DIAGNOSIS — Z9049 Acquired absence of other specified parts of digestive tract: Secondary | ICD-10-CM

## 2023-04-07 DIAGNOSIS — Z89412 Acquired absence of left great toe: Secondary | ICD-10-CM | POA: Diagnosis not present

## 2023-04-07 DIAGNOSIS — Z880 Allergy status to penicillin: Secondary | ICD-10-CM

## 2023-04-07 DIAGNOSIS — Z9079 Acquired absence of other genital organ(s): Secondary | ICD-10-CM

## 2023-04-07 DIAGNOSIS — Z794 Long term (current) use of insulin: Secondary | ICD-10-CM | POA: Diagnosis not present

## 2023-04-07 DIAGNOSIS — Z1501 Genetic susceptibility to malignant neoplasm of breast: Secondary | ICD-10-CM

## 2023-04-07 DIAGNOSIS — Z171 Estrogen receptor negative status [ER-]: Secondary | ICD-10-CM

## 2023-04-07 DIAGNOSIS — M25475 Effusion, left foot: Secondary | ICD-10-CM | POA: Diagnosis not present

## 2023-04-07 DIAGNOSIS — Z8051 Family history of malignant neoplasm of kidney: Secondary | ICD-10-CM

## 2023-04-07 DIAGNOSIS — Z888 Allergy status to other drugs, medicaments and biological substances status: Secondary | ICD-10-CM

## 2023-04-07 DIAGNOSIS — Z89422 Acquired absence of other left toe(s): Secondary | ICD-10-CM | POA: Diagnosis not present

## 2023-04-07 LAB — HEPATIC FUNCTION PANEL
ALT: 13 U/L (ref 0–44)
AST: 20 U/L (ref 15–41)
Albumin: 3.2 g/dL — ABNORMAL LOW (ref 3.5–5.0)
Alkaline Phosphatase: 73 U/L (ref 38–126)
Bilirubin, Direct: 0.2 mg/dL (ref 0.0–0.2)
Indirect Bilirubin: 0.4 mg/dL (ref 0.3–0.9)
Total Bilirubin: 0.6 mg/dL (ref 0.0–1.2)
Total Protein: 6.2 g/dL — ABNORMAL LOW (ref 6.5–8.1)

## 2023-04-07 LAB — CBC WITH DIFFERENTIAL/PLATELET
Abs Immature Granulocytes: 0.02 10*3/uL (ref 0.00–0.07)
Basophils Absolute: 0 10*3/uL (ref 0.0–0.1)
Basophils Relative: 0 %
Eosinophils Absolute: 0.2 10*3/uL (ref 0.0–0.5)
Eosinophils Relative: 2 %
HCT: 41.5 % (ref 36.0–46.0)
Hemoglobin: 12.8 g/dL (ref 12.0–15.0)
Immature Granulocytes: 0 %
Lymphocytes Relative: 32 %
Lymphs Abs: 2.2 10*3/uL (ref 0.7–4.0)
MCH: 25.8 pg — ABNORMAL LOW (ref 26.0–34.0)
MCHC: 30.8 g/dL (ref 30.0–36.0)
MCV: 83.7 fL (ref 80.0–100.0)
Monocytes Absolute: 0.3 10*3/uL (ref 0.1–1.0)
Monocytes Relative: 4 %
Neutro Abs: 4.2 10*3/uL (ref 1.7–7.7)
Neutrophils Relative %: 62 %
Platelets: 99 10*3/uL — ABNORMAL LOW (ref 150–400)
RBC: 4.96 MIL/uL (ref 3.87–5.11)
RDW: 14.6 % (ref 11.5–15.5)
Smear Review: DECREASED
WBC: 6.8 10*3/uL (ref 4.0–10.5)
nRBC: 0 % (ref 0.0–0.2)

## 2023-04-07 LAB — BASIC METABOLIC PANEL
Anion gap: 10 (ref 5–15)
BUN: 9 mg/dL (ref 6–20)
CO2: 22 mmol/L (ref 22–32)
Calcium: 8.8 mg/dL — ABNORMAL LOW (ref 8.9–10.3)
Chloride: 108 mmol/L (ref 98–111)
Creatinine, Ser: 1.42 mg/dL — ABNORMAL HIGH (ref 0.44–1.00)
GFR, Estimated: 44 mL/min — ABNORMAL LOW (ref >60)
Glucose, Bld: 104 mg/dL — ABNORMAL HIGH (ref 70–99)
Potassium: 4.1 mmol/L (ref 3.5–5.1)
Sodium: 140 mmol/L (ref 135–145)

## 2023-04-07 LAB — HIV ANTIBODY (ROUTINE TESTING W REFLEX): HIV Screen 4th Generation wRfx: NONREACTIVE

## 2023-04-07 MED ORDER — GABAPENTIN 300 MG PO CAPS
600.0000 mg | ORAL_CAPSULE | Freq: Three times a day (TID) | ORAL | Status: DC
  Administered 2023-04-07 – 2023-04-12 (×13): 600 mg via ORAL
  Filled 2023-04-07 (×2): qty 2
  Filled 2023-04-07 (×3): qty 6
  Filled 2023-04-07: qty 2
  Filled 2023-04-07: qty 6
  Filled 2023-04-07 (×3): qty 2
  Filled 2023-04-07: qty 6
  Filled 2023-04-07 (×3): qty 2

## 2023-04-07 MED ORDER — PANTOPRAZOLE SODIUM 40 MG PO TBEC
40.0000 mg | DELAYED_RELEASE_TABLET | Freq: Two times a day (BID) | ORAL | Status: DC
  Administered 2023-04-07 – 2023-04-12 (×9): 40 mg via ORAL
  Filled 2023-04-07 (×9): qty 1

## 2023-04-07 MED ORDER — AMITRIPTYLINE HCL 50 MG PO TABS
25.0000 mg | ORAL_TABLET | Freq: Every day | ORAL | Status: DC
  Administered 2023-04-07 – 2023-04-11 (×5): 25 mg via ORAL
  Filled 2023-04-07 (×5): qty 1

## 2023-04-07 MED ORDER — METOPROLOL SUCCINATE ER 25 MG PO TB24
12.5000 mg | ORAL_TABLET | Freq: Every day | ORAL | Status: DC
  Administered 2023-04-10 – 2023-04-12 (×3): 12.5 mg via ORAL
  Filled 2023-04-07 (×4): qty 1

## 2023-04-07 MED ORDER — VANCOMYCIN HCL 750 MG/150ML IV SOLN
750.0000 mg | INTRAVENOUS | Status: DC
  Filled 2023-04-07: qty 150

## 2023-04-07 MED ORDER — INSULIN GLARGINE-YFGN 100 UNIT/ML ~~LOC~~ SOLN: 50.0000 [IU] | Freq: Every day | SUBCUTANEOUS | Status: DC

## 2023-04-07 MED ORDER — ROPINIROLE HCL 0.5 MG PO TABS
0.2500 mg | ORAL_TABLET | Freq: Every day | ORAL | Status: DC
  Administered 2023-04-07 – 2023-04-11 (×5): 0.25 mg via ORAL
  Filled 2023-04-07 (×5): qty 1

## 2023-04-07 MED ORDER — INSULIN ASPART 100 UNIT/ML IJ SOLN
0.0000 [IU] | Freq: Every day | INTRAMUSCULAR | Status: DC
  Administered 2023-04-11: 2 [IU] via SUBCUTANEOUS

## 2023-04-07 MED ORDER — ATORVASTATIN CALCIUM 10 MG PO TABS
10.0000 mg | ORAL_TABLET | Freq: Every evening | ORAL | Status: DC
  Administered 2023-04-07 – 2023-04-11 (×5): 10 mg via ORAL
  Filled 2023-04-07 (×5): qty 1

## 2023-04-07 MED ORDER — SODIUM CHLORIDE 0.9 % IV SOLN
2.0000 g | Freq: Two times a day (BID) | INTRAVENOUS | Status: DC
  Administered 2023-04-07 – 2023-04-10 (×6): 2 g via INTRAVENOUS
  Filled 2023-04-07 (×6): qty 12.5

## 2023-04-07 MED ORDER — MORPHINE SULFATE ER 15 MG PO TBCR
30.0000 mg | EXTENDED_RELEASE_TABLET | Freq: Two times a day (BID) | ORAL | Status: DC
  Administered 2023-04-07 – 2023-04-12 (×9): 30 mg via ORAL
  Filled 2023-04-07 (×9): qty 2

## 2023-04-07 MED ORDER — BUSPIRONE HCL 5 MG PO TABS
5.0000 mg | ORAL_TABLET | Freq: Three times a day (TID) | ORAL | Status: DC
Start: 2023-04-07 — End: 2023-04-12
  Administered 2023-04-07 – 2023-04-12 (×13): 5 mg via ORAL
  Filled 2023-04-07 (×13): qty 1

## 2023-04-07 MED ORDER — SODIUM CHLORIDE 0.9 % IV SOLN: INTRAVENOUS | Status: AC

## 2023-04-07 MED ORDER — METRONIDAZOLE 500 MG/100ML IV SOLN
500.0000 mg | Freq: Two times a day (BID) | INTRAVENOUS | Status: DC
  Administered 2023-04-07 – 2023-04-10 (×6): 500 mg via INTRAVENOUS
  Filled 2023-04-07 (×6): qty 100

## 2023-04-07 MED ORDER — LIDOCAINE 5 % EX PTCH
3.0000 | MEDICATED_PATCH | CUTANEOUS | Status: DC
  Filled 2023-04-07 (×4): qty 3

## 2023-04-07 MED ORDER — POLYETHYLENE GLYCOL 3350 17 G PO PACK
17.0000 g | PACK | Freq: Every day | ORAL | Status: DC | PRN
  Administered 2023-04-10: 17 g via ORAL
  Filled 2023-04-07: qty 1

## 2023-04-07 MED ORDER — SORBITOL 70 % SOLN: 30.0000 mL | Freq: Every day | Status: DC | PRN

## 2023-04-07 MED ORDER — VANCOMYCIN HCL 2000 MG/400ML IV SOLN
2000.0000 mg | Freq: Once | INTRAVENOUS | Status: AC
  Administered 2023-04-07: 2000 mg via INTRAVENOUS
  Filled 2023-04-07: qty 400

## 2023-04-07 MED ORDER — CARIPRAZINE HCL 1.5 MG PO CAPS
3.0000 mg | ORAL_CAPSULE | Freq: Every day | ORAL | Status: DC
  Administered 2023-04-08 – 2023-04-12 (×4): 3 mg via ORAL
  Filled 2023-04-07 (×5): qty 2

## 2023-04-07 MED ORDER — LOSARTAN POTASSIUM 50 MG PO TABS
50.0000 mg | ORAL_TABLET | Freq: Every evening | ORAL | Status: DC
  Administered 2023-04-08 – 2023-04-11 (×4): 50 mg via ORAL
  Filled 2023-04-07 (×4): qty 1

## 2023-04-07 MED ORDER — INSULIN ASPART 100 UNIT/ML IJ SOLN
0.0000 [IU] | Freq: Three times a day (TID) | INTRAMUSCULAR | Status: DC
  Administered 2023-04-08 (×2): 2 [IU] via SUBCUTANEOUS
  Administered 2023-04-08: 3 [IU] via SUBCUTANEOUS
  Administered 2023-04-09: 2 [IU] via SUBCUTANEOUS
  Administered 2023-04-09: 8 [IU] via SUBCUTANEOUS
  Administered 2023-04-10 – 2023-04-11 (×4): 3 [IU] via SUBCUTANEOUS
  Administered 2023-04-11: 5 [IU] via SUBCUTANEOUS
  Administered 2023-04-11: 8 [IU] via SUBCUTANEOUS
  Administered 2023-04-12: 3 [IU] via SUBCUTANEOUS
  Administered 2023-04-12: 5 [IU] via SUBCUTANEOUS

## 2023-04-07 MED ORDER — ACETAMINOPHEN 325 MG PO TABS
650.0000 mg | ORAL_TABLET | Freq: Four times a day (QID) | ORAL | Status: DC | PRN
  Administered 2023-04-08 – 2023-04-09 (×2): 650 mg via ORAL
  Filled 2023-04-07 (×2): qty 2

## 2023-04-07 MED ORDER — RAMELTEON 8 MG PO TABS
8.0000 mg | ORAL_TABLET | Freq: Every day | ORAL | Status: DC
  Administered 2023-04-07 – 2023-04-11 (×5): 8 mg via ORAL
  Filled 2023-04-07 (×6): qty 1

## 2023-04-07 MED ORDER — SODIUM CHLORIDE 0.9% FLUSH
3.0000 mL | Freq: Two times a day (BID) | INTRAVENOUS | Status: DC
  Administered 2023-04-08 – 2023-04-12 (×8): 3 mL via INTRAVENOUS

## 2023-04-07 MED ORDER — ALBUTEROL SULFATE (2.5 MG/3ML) 0.083% IN NEBU
2.5000 mg | INHALATION_SOLUTION | Freq: Four times a day (QID) | RESPIRATORY_TRACT | Status: DC | PRN
  Administered 2023-04-08: 2.5 mg via RESPIRATORY_TRACT
  Filled 2023-04-07: qty 3

## 2023-04-07 MED ORDER — LEVOTHYROXINE SODIUM 75 MCG PO TABS
75.0000 ug | ORAL_TABLET | Freq: Every day | ORAL | Status: DC
  Administered 2023-04-08 – 2023-04-12 (×5): 75 ug via ORAL
  Filled 2023-04-07 (×5): qty 1

## 2023-04-07 MED ORDER — BUPROPION HCL ER (XL) 150 MG PO TB24
300.0000 mg | ORAL_TABLET | Freq: Every morning | ORAL | Status: DC
  Administered 2023-04-08 – 2023-04-12 (×4): 300 mg via ORAL
  Filled 2023-04-07 (×4): qty 2

## 2023-04-07 MED ORDER — ACETAMINOPHEN 650 MG RE SUPP: 650.0000 mg | Freq: Four times a day (QID) | RECTAL | Status: DC | PRN

## 2023-04-07 MED ORDER — INSULIN GLARGINE 100 UNIT/ML ~~LOC~~ SOLN
50.0000 [IU] | Freq: Every day | SUBCUTANEOUS | Status: DC
  Administered 2023-04-08 – 2023-04-10 (×4): 50 [IU] via SUBCUTANEOUS
  Filled 2023-04-07 (×5): qty 0.5

## 2023-04-07 MED ORDER — LEVOMILNACIPRAN HCL ER 80 MG PO CP24: 1.0000 | ORAL_CAPSULE | Freq: Every evening | ORAL | Status: DC

## 2023-04-07 MED ORDER — LISDEXAMFETAMINE DIMESYLATE 70 MG PO CAPS
70.0000 mg | ORAL_CAPSULE | Freq: Every day | ORAL | Status: DC
  Administered 2023-04-08: 70 mg via ORAL
  Filled 2023-04-07: qty 1

## 2023-04-07 NOTE — H&P (Signed)
 History and Physical   Linda [name] ZOX:[id number] DOB: [birth year] DOA: 04/07/2023  PCP: [name], MD   Patient coming from: Home/podiatry office  Chief Complaint: Foot wound  HPI: Linda [name] is a 53 y.o. female with medical history significant of hypertension, hyperlipidemia, diabetes, neuropathy, hypothyroidism, anemia, RLS, GERD, ITP, breast cancer, NASH cirrhosis, depression, obesity, chronic pain presenting with worsening foot wound.  Patient is followed by podiatry for chronic left third toe wound.  She has been on doxycycline.  Has noted some fevers at home for the past 2 days.  Was seen by podiatry today.  Recommended come to the ED for more urgent evaluation.  Had been already scheduled for amputation but this is going to be moved up.  Reports som decreased appetite and nausea. Denies chills, chest pain, shortness of breath, abdominal pain, constipation, diarrhea, vomiting.  ED Course: Vital signs in the ED stable.  Lab workup included BMP with creatinine elevated to 1.42 from baseline of 1, glucose 104, calcium 8.8.  CBC with chronic thrombocytopenia at 99.  MRI left foot pending.  Case discussed with podiatry by EDP who plan for amputation either this weekend or Monday.  Review of Systems: As per HPI otherwise all other systems reviewed and are negative.  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 2 diabetes mellitus, with fat layer exposed (HCC) 03/27/2020   Gastritis    Genetic susceptibility to malignant  neoplasm of breast    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 No longer has    Past Surgical History:  Procedure Laterality Date   AMPUTATION TOE Bilateral 03/04/2022   Procedure: AMPUTATION TOE RIGHT FOOT PARTIAL OR TOTAL GREAT TOE AND SECOND TOE, LEFT FOOT PARTIAL OR TOTAL GREAT TOE, SECOND AND THIRD TOE;  Surgeon: Pilar Plate, DPM;  Location: [facility];  Service: Podiatry;  Laterality: Bilateral;   AMPUTATION TOE Right 01/04/2023   Procedure: R 1ST TOE AMPUTATION, RIGHT 3RD TOE AMPUTATION;  Surgeon: Pilar Plate, DPM;  Location: [facility];  Service: Orthopedics/Podiatry;  Laterality: Right;   APPENDECTOMY     BACK SURGERY     x 3 lower disc   BILATERAL TOTAL MASTECTOMY WITH AXILLARY LYMPH NODE DISSECTION Bilateral 09/2019   CHOLECYSTECTOMY     nephrolithiasis     ROBOTIC ASSISTED TOTAL  HYSTERECTOMY WITH BILATERAL SALPINGO OOPHERECTOMY Bilateral 06/14/2016   Procedure: ROBOTIC ASSISTED TOTAL HYSTERECTOMY WITH BILATERAL SALPINGO OOPHORECTOMY;  Surgeon: Cleda Mccreedy, MD;  Location: [facility];  Service: Gynecology;  Laterality: Bilateral;    Social History  reports that she has never smoked. She has never used smokeless tobacco. She reports that she does not drink alcohol and does not use drugs.  Allergies  Allergen Reactions   Codeine Shortness Of Breath and Other (See Comments)    Other reaction(s): SHOB    Augmentin [Amoxicillin-Pot  Clavulanate] Diarrhea   Celebrex [Celecoxib] Other (See Comments)    Unknown reaction   Propranolol Other (See Comments)    Other reaction(s): Unknown   Propranolol Hcl Other (See Comments)    Unknown reaction  ask   Simvastatin Other (See Comments)    Other reaction(s): Unknown   Vytorin [Ezetimibe-Simvastatin] Other (See Comments)    Unknown reaction  Patient is not aware of an allergy to this medication, ask   Zetia [Ezetimibe] Other (See Comments)    Other reaction(s): Unknown    Family History  Problem Relation Age of Onset   Breast cancer Mother        breast   CAD Father    Diabetes Father    Heart failure Father    Cancer Father        renal carcinoma   Kidney failure Father    Kidney cancer Father    Kidney cancer Brother 79       renal carcinoma.   Stroke Paternal Grandmother   Reviewed on admission  Prior to Admission medications   Medication Sig Start Date End Date Taking? Authorizing Provider  acetaminophen (TYLENOL) 500 MG tablet Take 500 mg by mouth every 6 (six) hours as needed for moderate pain or mild pain.    [provider]  albuterol (VENTOLIN HFA) 108 (90 Base) MCG/ACT inhaler INHALE TWO PUFFS BY MOUTH INTO LUNGS every SIX hours orn FOR WHEEZING AND/OR SHORTNESS OF BREATH 09/19/22   [name], Kirsten, MD  amitriptyline (ELAVIL) 25 MG tablet TAKE ONE TABLET BY MOUTH AT BEDTIME 10/13/22   [name], Kirsten, MD  atorvastatin (LIPITOR) 10 MG tablet TAKE ONE TABLET BY MOUTH EVERY EVENING 01/14/23   [name], Kirsten, MD  Blood Glucose Monitoring Suppl (ONETOUCH VERIO REFLECT) w/Device KIT AS DIRECTED 01/30/19   [provider]  Blood Pressure Monitoring (SPHYGMOMANOMETER) MISC 1 each by Does not apply route daily in the afternoon. 12/23/21   [name], MD  buPROPion (WELLBUTRIN XL) 300 MG 24 hr tablet Take 1 tablet (300 mg total) by mouth every morning. 09/14/22   [name], Kirsten, MD  busPIRone (BUSPAR) 5 MG tablet TAKE ONE TABLET BY MOUTH 3 TIMES DAILY  03/30/23   [name], [name], MD  Continuous Blood Gluc Receiver (FREESTYLE LIBRE 2 READER) DEVI E11.69 Check blood sugar 4 times daily as directed 03/05/20   [name], [name], MD  Continuous Glucose Sensor (FREESTYLE LIBRE 3 PLUS SENSOR) MISC Change sensor every 15 days. 01/02/23   [name], [name], MD  cyclobenzaprine (FLEXERIL) 10 MG tablet TAKE ONE TABLET BY MOUTH EVERY 8 HOURS AS NEEDED FOR MUSCLE SPASMS 01/14/23   [name], Kirsten, MD  dicyclomine (BENTYL) 20 MG tablet TAKE ONE TABLET BY MOUTH BEFORE MEALS AND AT BEDTIME AS NEEDED FOR STOMACH CRAMPING 01/14/23   [name], [name], MD  doxycycline (VIBRA-TABS) 100 MG tablet Take 1 tablet (100 mg total) by mouth 2 (two) times daily for 10 days. 04/03/23 04/13/23  Barbaraann Share, DPM  ferrous sulfate  325 (65 FE) MG tablet Take 325 mg by mouth every evening.    [provider]  FETZIMA 80 MG CP24 TAKE ONE CAPSULE BY MOUTH EVERY EVENING 03/01/23   [name], [name], MD  gabapentin (NEURONTIN) 400 MG capsule TAKE 2 CAPSULES BY MOUTH 3 TIMES DAILY 03/01/23   Marianne Sofia, PA-C  gabapentin (NEURONTIN) 600 MG tablet Take 1 tablet (600 mg total) by mouth 3 (three) times daily for 14 days. 03/31/23 04/14/23  Renne Crigler, FNP  glucose blood test strip Use as instructed to check FBS daily. E11.40 12/26/22   CoxFritzi Mandes, MD  Insulin Pen Needle (BD PEN NEEDLE NANO U/F) 32G X 4 MM MISC Use new needle with each injection. 09/16/22   [name], [name], MD  Lancets [facility] DELICA PLUS Janene Madeira) MISC USE TO check blood glucose 2-3 times daily AS DIRECTED 12/26/22   [name], [name], MD  levothyroxine (SYNTHROID) 75 MCG tablet Take 1 tablet (75 mcg total) by mouth daily before breakfast. 09/14/22   [name], Kirsten, MD  lidocaine (LIDODERM) 5 % Place 3 patches onto the skin daily. Remove & Discard patch within 12 hours or as directed by MD 01/24/23   [name], [name], MD  losartan (COZAAR) 50 MG tablet TAKE ONE TABLET BY MOUTH EVERY EVENING 10/13/22   CoxFritzi Mandes, MD  Magnesium 500 MG CAPS Take 1 capsule (500  mg total) by mouth daily. 09/14/22   CoxFritzi Mandes, MD  metoprolol succinate (TOPROL-XL) 25 MG 24 hr tablet Take 0.5 tablets (12.5 mg total) by mouth daily. 01/24/23   CoxFritzi Mandes, MD  morphine (MS CONTIN) 30 MG 12 hr tablet Take 1 tablet (30 mg total) by mouth every 12 (twelve) hours. 03/14/23   [name], MD  MOUNJARO 7.5 MG/0.5ML Pen INJECT 7.5 MG INTO THE SKIN EVERY WEEK 03/27/23   [name], [name], MD  Multiple Vitamin (MULTIVITAMIN WITH MINERALS) TABS tablet Take 1 tablet by mouth daily. Solar ray    [provider]  mupirocin ointment (BACTROBAN) 2 % Apply 1 Application topically daily. 11/23/22   Standiford, Jenelle Mages, DPM  nystatin (MYCOSTATIN/NYSTOP) powder Apply 1 Application topically 3 (three) times daily. 03/31/23   Renne Crigler, FNP  ondansetron (ZOFRAN) 4 MG tablet Take 1 tablet (4 mg total) by mouth every 4 (four) hours as needed for nausea. 09/14/22   CoxFritzi Mandes, MD  OVER THE COUNTER MEDICATION Take 1 tablet by mouth in the morning and at bedtime. Lutein for eyes    [provider]  pantoprazole (PROTONIX) 40 MG tablet TAKE ONE TABLET BY MOUTH TWICE DAILY 02/14/23   [name], Kirsten, MD  polyethylene glycol (MIRALAX / GLYCOLAX) 17 g packet Take 17 g by mouth daily as needed for moderate constipation. 12/23/15   [provider]  potassium chloride (MICRO-K) 10 MEQ CR capsule Take 2 capsules (20 mEq total) by mouth 2 (two) times daily. 09/14/22   CoxFritzi Mandes, MD  Probiotic Product (PROBIOTIC PO) Take 1 capsule by mouth in the morning. 3.2 billion cfu    [provider]  prochlorperazine (COMPAZINE) 5 MG tablet TAKE ONE TABLET BY MOUTH every SIX hours AS NEEDED FOR NAUSEA AND VOMITING 09/19/22   [name], Kirsten, MD  ramelteon (ROZEREM) 8 MG tablet TAKE ONE TABLET BY MOUTH AT BEDTIME 01/14/23   [name], Kirsten, MD  rOPINIRole (REQUIP) 0.25 MG tablet TAKE ONE TABLET BY MOUTH AT BEDTIME 01/14/23   [name], Kirsten, MD  SYNJARDY 12.05-998 MG TABS TAKE ONE TABLET BY MOUTH  TWICE DAILY 03/01/23  Marianne Sofia, PA-C  tolnaftate (TINACTIN) 1 % powder Apply 1 Application topically 2 (two) times daily for 14 days. 04/03/23 04/17/23  Barbaraann Share, DPM  TRESIBA FLEXTOUCH 200 UNIT/ML FlexTouch Pen INJECT 60 UNITS SUBCUTANEOUSLY DAILY 12/27/22   [name], [name], MD  Vitamin D, Ergocalciferol, (DRISDOL) 1.25 MG (50000 UNIT) CAPS capsule TAKE ONE CAPSULE BY MOUTH EVERY FRIDAY AT 8AM 01/14/23   [name], MD  VRAYLAR 3 MG capsule TAKE ONE CAPSULE BY MOUTH EVERY DAY 03/14/23   [name], MD  VYVANSE 70 MG capsule TAKE ONE CAPSULE BY MOUTH EVERY DAY 03/14/23   [name], MD    Physical Exam: Vitals:   04/07/23 1512  BP: 117/89  Pulse: 96  Resp: 20  Temp: 98.5 F (36.9 C)  TempSrc: Oral  SpO2: 96%    Physical Exam Constitutional:      General: She is not in acute distress.    Appearance: Normal appearance. She is obese.  HENT:     Head: Normocephalic and atraumatic.     Mouth/Throat:     Mouth: Mucous membranes are moist.     Pharynx: Oropharynx is clear.  Eyes:     Extraocular Movements: Extraocular movements intact.     Pupils: Pupils are equal, round, and reactive to light.  Cardiovascular:     Rate and Rhythm: Normal rate and regular rhythm.     Pulses: Normal pulses.     Heart sounds: Normal heart sounds.  Pulmonary:     Effort: Pulmonary effort is normal. No respiratory distress.     Breath sounds: Normal breath sounds.  Abdominal:     General: Bowel sounds are normal. There is no distension.     Palpations: Abdomen is soft.     Tenderness: There is no abdominal tenderness.  Musculoskeletal:        General: No swelling or deformity.  Skin:    General: Skin is warm and dry.  Neurological:     General: No focal deficit present.     Mental Status: Mental status is at baseline.    Labs on Admission: I have personally reviewed following labs and imaging studies  CBC: Recent Labs  Lab 04/07/23 1652  WBC 6.8  NEUTROABS 4.2  HGB 12.8  HCT  41.5  MCV 83.7  PLT 99*    Basic Metabolic Panel: Recent Labs  Lab 04/07/23 1652  NA 140  K 4.1  CL 108  CO2 22  GLUCOSE 104*  BUN 9  CREATININE 1.42*  CALCIUM 8.8*    GFR: Estimated Creatinine Clearance: 48 mL/min (A) (by C-G formula based on SCr of 1.42 mg/dL (H)).  Liver Function Tests: No results for input(s): "AST", "ALT", "ALKPHOS", "BILITOT", "PROT", "ALBUMIN" in the last 168 hours.  Urine analysis:    Component Value Date/Time   COLORURINE YELLOW 06/13/2016 0803   APPEARANCEUR CLEAR 06/13/2016 0803   LABSPEC 1.021 06/13/2016 0803   PHURINE 5.0 06/13/2016 0803   GLUCOSEU NEGATIVE 06/13/2016 0803   HGBUR NEGATIVE 06/13/2016 0803   BILIRUBINUR negative 09/14/2022 0840   KETONESUR negative 09/14/2022 0840   KETONESUR NEGATIVE 06/13/2016 0803   PROTEINUR NEGATIVE 06/13/2016 0803   UROBILINOGEN 0.2 09/14/2022 0840   NITRITE Negative 09/14/2022 0840   NITRITE NEGATIVE 06/13/2016 0803   LEUKOCYTESUR Negative 09/14/2022 0840    Radiological Exams on Admission: No results found.  EKG: Not performed in emergency department.  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  Followed by podiatry.  On doxycycline.  Recurrent fevers and worsening wound. > Sent to the ED for further  evaluation and to move up amputation that has been planned for later in the month.  EDP has discussed with podiatry. > No leukocytosis.  Afebrile here so far.  MRI left foot pending. > Since she is worsening on p.o. antibiotics we will start her on IV antibiotics to cover for diabetic foot wound. - Monitoring telemetry - Appreciate podiatry recommendations and assistance, sounds like they plan to see the patient in the morning of 3/15. - Start vancomycin, cefepime, Flagyl for diabetic foot wound - Trend fever curve and WBC - Supportive care  Hypertension - Continue losartan, metoprolol  Hyperlipidemia - Continue home atorvastatin  Diabetes > 68 units qhs outpatient - 50 units qhs - SSI  Neuropathy - Continue gabapentin  Anemia Thrombocytopenia (Hx of ITP and Cirrhosis) > Hemoglobin currently normal.  Platelets stable. > Patient notes that she has been told by her hematologist/oncologist that due to prior significant bleeding during surgery that she may require platelet transfusion prior to surgical intervention in the future. - Consider discussing with hematology in the morning. - Trend CBC  RLS - Continue home ropinirole  GERD - Continue PPI  History of breast cancer > Status post bilateral mastectomy 2021 and adjuvant chemotherapy.  Currently on observation. - Noted  NASH cirrhosis > Stable platelets.  Will add on LFTs. - Trend CMP  Depression -Continue home amitriptyline, Wellbutrin, BuSpar, Vraylar, Fetzima -Continue home ramelteon and Vyvanse  Obesity - Noted  Chronic pain - Continue home morphine  DVT prophylaxis: SCDs Code Status:   Full Family Communication:  None on admission Disposition Plan:   Patient is from:  Home  Anticipated DC to:  Home  Anticipated DC date:  2 to 5 days  Anticipated DC barriers: None  Consults called:  Podiatry consulted by EDP, likely will see patient 3/15. Admission status:  Inpatient, telemetry  Severity of  Illness: The appropriate patient status for this patient is INPATIENT. Inpatient status is judged to be reasonable and necessary in order to provide the required intensity of service to ensure the patient's safety. The patient's presenting symptoms, physical exam findings, and initial radiographic and laboratory data in the context of their chronic comorbidities is felt to place them at high risk for further clinical deterioration. Furthermore, it is not anticipated that the patient will be medically stable for discharge from the hospital within 2 midnights of admission.   * I certify that at the point of admission it is my clinical judgment that the patient will require inpatient hospital care spanning beyond 2 midnights from the point of admission due to high intensity of service, high risk for further deterioration and high frequency of surveillance required.Synetta Fail MD [facility]  How to contact the [facility] Attending or Consulting  provider 7A - 7P or covering provider during after hours 7P -7A, for this patient?   Check the care team in [facility] and look for a) attending/consulting [facility] provider listed and b) the [facility] team listed Log into [email or web address] and use [facility] universal password to access. If you do not have the password, please contact the hospital operator. Locate the [facility] provider you are looking for under [facility] and page to a number that you can be directly reached. If you still have difficulty reaching the provider, please page the [facility] (Director on Call) for the Hospitalists listed on amion for assistance.  04/07/2023, 6:48 PM

## 2023-04-07 NOTE — Progress Notes (Signed)
 Pharmacy Antibiotic Note  Linda [name] is a 53 y.o. female admitted on 04/07/2023 with  wound infectoin .  Pharmacy has been consulted for vancomycin and cefepime dosing.  Plan: Cefepime 2g q12h.  Vancomycin 2000mg  load, followed by vancomycin 750mg  q24h for eAUC: 475, Vd: 0.5 (BMI 37), IBW, Scr: 1.42. Flagyl per MD.  Myrtis Ser data for de-escalation.  Monitor renal function for dose adjustments as indicated.      Temp (24hrs), Avg:99.1 F (37.3 C), Min:98.5 F (36.9 C), Max:99.7 F (37.6 C)  Recent Labs  Lab 04/07/23 1652  WBC 6.8  CREATININE 1.42*    Estimated Creatinine Clearance: 48 mL/min (A) (by C-G formula based on SCr of 1.42 mg/dL (H)).    Allergies  Allergen Reactions   Codeine Shortness Of Breath and Other (See Comments)    Other reaction(s): SHOB    Augmentin [Amoxicillin-Pot Clavulanate] Diarrhea   Celebrex [Celecoxib] Other (See Comments)    Unknown reaction   Propranolol Other (See Comments)    Other reaction(s): Unknown   Propranolol Hcl Other (See Comments)    Unknown reaction  ask   Simvastatin Other (See Comments)    Other reaction(s): Unknown   Vytorin [Ezetimibe-Simvastatin] Other (See Comments)    Unknown reaction  Patient is not aware of an allergy to this medication, ask   Zetia [Ezetimibe] Other (See Comments)    Other reaction(s): Unknown    Antimicrobials this admission: Vancomycin 3/14 >>  Cefepime 3/14 >>  Flagyl 3/14 >>   Thank you for allowing pharmacy to be a part of this patient's care.  Estill Batten, PharmD, BCCCP  04/07/2023 6:34 PM

## 2023-04-07 NOTE — ED Triage Notes (Signed)
 Pt BIB by EMS for wound check. DFU to left foot, 4th digit. Subjective fever reported at home. Pt states she was told to come to the ED for surgery Dr. Jamse Arn. Afebrile on arrival. VS stable at this time.

## 2023-04-07 NOTE — Progress Notes (Signed)
 Chief Complaint  Patient presents with   Wound Check    Left 4th toe. Has been being treated by Dr. Jamse Arn, he told her at apt on Monday that if it got worse to go to hospital but that when she went they would more than likely take the toe.  She wants to make sure that she needs to go to the hospital or if it can wait till she gets scheduled to do they suregery.    HPI: 53 y.o. female presents today after seeing Dr. Jamse Arn on Monday at our office.  She is currently on doxycycline for infection, but states that she is starting to feel nauseous and feels like the ulcer/fissure on her left fourth toe is getting worse.  Notes that she does have difficulty wrapping it.  States that her blood sugar has been really high due to stress and possibly an infection.  States that she is open to having the toe amputated if it needs to be so that she can get better.  She is wondering if she can wait until Monday or if she needs to go to the hospital now.  States that she has had nausea.  States that she occasionally gets yeast infections from antibiotics  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 2 diabetes mellitus, with fat layer exposed (HCC) 03/27/2020   Gastritis    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 No longer has    Past Surgical History:  Procedure Laterality Date   AMPUTATION TOE Bilateral 03/04/2022   Procedure: AMPUTATION TOE RIGHT FOOT PARTIAL OR TOTAL GREAT TOE AND SECOND TOE, LEFT FOOT PARTIAL OR TOTAL GREAT TOE, SECOND AND THIRD TOE;  Surgeon: Pilar Plate, DPM;  Location: [facility] OR;  Service: Podiatry;  Laterality: Bilateral;   AMPUTATION TOE Right 01/04/2023   Procedure: R 1ST TOE AMPUTATION, RIGHT 3RD TOE AMPUTATION;  Surgeon: Pilar Plate, DPM;  Location: [facility];  Service: Orthopedics/Podiatry;  Laterality: Right;   APPENDECTOMY     BACK SURGERY     x 3 lower disc   BILATERAL TOTAL MASTECTOMY WITH AXILLARY LYMPH NODE DISSECTION Bilateral 09/2019   CHOLECYSTECTOMY     nephrolithiasis     ROBOTIC ASSISTED TOTAL HYSTERECTOMY WITH  BILATERAL SALPINGO OOPHERECTOMY Bilateral 06/14/2016   Procedure: ROBOTIC ASSISTED TOTAL HYSTERECTOMY WITH BILATERAL SALPINGO OOPHORECTOMY;  Surgeon: Cleda Mccreedy, MD;  Location: [facility];  Service: Gynecology;  Laterality: Bilateral;    Allergies  Allergen Reactions   Codeine Shortness Of Breath and Other (See Comments)    Other reaction(s): SHOB    Augmentin [Amoxicillin-Pot Clavulanate] Diarrhea   Celebrex [Celecoxib] Other (See Comments)    Unknown reaction   Propranolol Other (See Comments)    Other reaction(s): Unknown   Propranolol Hcl Other (See Comments)    Unknown reaction  ask   Simvastatin Other (See Comments)    Other reaction(s): Unknown   Vytorin [Ezetimibe-Simvastatin] Other (See Comments)    Unknown reaction  Patient is not aware of an allergy to this  medication, ask   Zetia [Ezetimibe] Other (See Comments)    Other reaction(s): Unknown    Review of Systems  Gastrointestinal:  Positive for nausea.  Skin:        Ulcer left foot      Physical Exam: Vitals:   04/07/23 1221  BP: 125/76  Pulse: 97  Temp: 99.7 F (37.6 C)  SpO2: 90%   Trace palpable pedal pulses left foot.  There is a deep ulceration on the plantar medial aspect of the left fourth toe with exposed muscle/tendon.  There is moderate maceration and grayish discoloration to the soft tissues.  The area has serosanguineous drainage.  There is a musty odor.  There is localized erythema, calor and edema.  No pain on palpation is noted  Assessment/Plan of Care: 1. Diabetic ulcer of toe of left foot associated with type 2 diabetes mellitus, with necrosis of muscle (HCC)   2. Skin ulcer of fourth toe of left foot with necrosis of muscle (HCC)    Discussed clinical findings with patient today.  Due to early signs of sepsis with an increasing oral temperature, lower oxygen saturation, nausea and worsening of ulcer with increasing blood sugar, the patient was encouraged to go to the emergency room today and not wait until Monday.  Dr. Jamse Arn was notified and agreed that she should go ahead and proceed to the ER.  The wound was wrapped with dry sterile gauze dressing and she will try to find transportation to take her today.   Clerance Lav, DPM, FACFAS [facility]     [address] [place], [place] [address]                Office [phone]  Fax [phone]

## 2023-04-07 NOTE — ED Provider Notes (Signed)
 [place] EMERGENCY DEPARTMENT AT [facility] Provider Note   CSN: [id number] Arrival date & time: 04/07/23  1509     History  Chief Complaint  Patient presents with   Wound Check    Camillia Marcy is a 53 y.o. female.  53 year old with history of chronic left third toe infection.  Subjective fevers at home.  No red streaks going up her lower extremity.  Was seen by her podiatrist today and the outside chart was reviewed and she is scheduled for amputation sometime next week possibly.  Patient states she was told to come to the ER for admission for possible potation.       Home Medications Prior to Admission medications   Medication Sig Start Date End Date Taking? Authorizing Provider  acetaminophen (TYLENOL) 500 MG tablet Take 500 mg by mouth every 6 (six) hours as needed for moderate pain or mild pain.    [provider]  albuterol (VENTOLIN HFA) 108 (90 Base) MCG/ACT inhaler INHALE TWO PUFFS BY MOUTH INTO LUNGS every SIX hours orn FOR WHEEZING AND/OR SHORTNESS OF BREATH 09/19/22   [name], Kirsten, MD  amitriptyline (ELAVIL) 25 MG tablet TAKE ONE TABLET BY MOUTH AT BEDTIME 10/13/22   [name], Kirsten, MD  atorvastatin (LIPITOR) 10 MG tablet TAKE ONE TABLET BY MOUTH EVERY EVENING 01/14/23   [name], Kirsten, MD  Blood Glucose Monitoring Suppl (ONETOUCH VERIO REFLECT) w/Device KIT AS DIRECTED 01/30/19   [provider]  Blood Pressure Monitoring (SPHYGMOMANOMETER) MISC 1 each by Does not apply route daily in the afternoon. 12/23/21   [name], MD  buPROPion (WELLBUTRIN XL) 300 MG 24 hr tablet Take 1 tablet (300 mg total) by mouth every morning. 09/14/22   [name], Kirsten, MD  busPIRone (BUSPAR) 5 MG tablet TAKE ONE TABLET BY MOUTH 3 TIMES DAILY 03/30/23   [name], [name], MD  Continuous Blood Gluc Receiver (FREESTYLE LIBRE 2 READER) DEVI E11.69 Check blood sugar 4 times daily as directed 03/05/20   [name], [name], MD  Continuous Glucose Sensor (FREESTYLE LIBRE 3 PLUS  SENSOR) MISC Change sensor every 15 days. 01/02/23   [name], [name], MD  cyclobenzaprine (FLEXERIL) 10 MG tablet TAKE ONE TABLET BY MOUTH EVERY 8 HOURS AS NEEDED FOR MUSCLE SPASMS 01/14/23   [name], Kirsten, MD  dicyclomine (BENTYL) 20 MG tablet TAKE ONE TABLET BY MOUTH BEFORE MEALS AND AT BEDTIME AS NEEDED FOR STOMACH CRAMPING 01/14/23   [name], [name], MD  doxycycline (VIBRA-TABS) 100 MG tablet Take 1 tablet (100 mg total) by mouth 2 (two) times daily for 10 days. 04/03/23 04/13/23  Barbaraann Share, DPM  ferrous sulfate 325 (65 FE) MG tablet Take 325 mg by mouth every evening.    [provider]  FETZIMA 80 MG CP24 TAKE ONE CAPSULE BY MOUTH EVERY EVENING 03/01/23   [name], [name], MD  gabapentin (NEURONTIN) 400 MG capsule TAKE 2 CAPSULES BY MOUTH 3 TIMES DAILY 03/01/23   Marianne Sofia, PA-C  gabapentin (NEURONTIN) 600 MG tablet Take 1 tablet (600 mg total) by mouth 3 (three) times daily for 14 days. 03/31/23 04/14/23  Renne Crigler, FNP  glucose blood test strip Use as instructed to check FBS daily. E11.40 12/26/22   CoxFritzi Mandes, MD  Insulin Pen Needle (BD PEN NEEDLE NANO U/F) 32G X 4 MM MISC Use new needle with each injection. 09/16/22   [name], [name], MD  Lancets [facility] DELICA PLUS LANCET30G) MISC USE TO check blood glucose 2-3 times daily AS DIRECTED 12/26/22   [name],  MD  levothyroxine (SYNTHROID) 75 MCG tablet Take 1 tablet (75 mcg total) by mouth daily before breakfast. 09/14/22   [name], Kirsten, MD  lidocaine (LIDODERM) 5 % Place 3 patches onto the skin daily. Remove & Discard patch within 12 hours or as directed by MD 01/24/23   [name], [name], MD  losartan (COZAAR) 50 MG tablet TAKE ONE TABLET BY MOUTH EVERY EVENING 10/13/22   CoxFritzi Mandes, MD  Magnesium 500 MG CAPS Take 1 capsule (500 mg total) by mouth daily. 09/14/22   CoxFritzi Mandes, MD  metoprolol succinate (TOPROL-XL) 25 MG 24 hr tablet Take 0.5 tablets (12.5 mg total) by mouth daily. 01/24/23   CoxFritzi Mandes, MD  morphine (MS CONTIN) 30 MG 12 hr  tablet Take 1 tablet (30 mg total) by mouth every 12 (twelve) hours. 03/14/23   [name], MD  MOUNJARO 7.5 MG/0.5ML Pen INJECT 7.5 MG INTO THE SKIN EVERY WEEK 03/27/23   [name], [name], MD  Multiple Vitamin (MULTIVITAMIN WITH MINERALS) TABS tablet Take 1 tablet by mouth daily. Solar ray    [provider]  mupirocin ointment (BACTROBAN) 2 % Apply 1 Application topically daily. 11/23/22   Standiford, Jenelle Mages, DPM  nystatin (MYCOSTATIN/NYSTOP) powder Apply 1 Application topically 3 (three) times daily. 03/31/23   Renne Crigler, FNP  ondansetron (ZOFRAN) 4 MG tablet Take 1 tablet (4 mg total) by mouth every 4 (four) hours as needed for nausea. 09/14/22   CoxFritzi Mandes, MD  OVER THE COUNTER MEDICATION Take 1 tablet by mouth in the morning and at bedtime. Lutein for eyes    [provider]  pantoprazole (PROTONIX) 40 MG tablet TAKE ONE TABLET BY MOUTH TWICE DAILY 02/14/23   [name], Kirsten, MD  polyethylene glycol (MIRALAX / GLYCOLAX) 17 g packet Take 17 g by mouth daily as needed for moderate constipation. 12/23/15   [provider]  potassium chloride (MICRO-K) 10 MEQ CR capsule Take 2 capsules (20 mEq total) by mouth 2 (two) times daily. 09/14/22   CoxFritzi Mandes, MD  Probiotic Product (PROBIOTIC PO) Take 1 capsule by mouth in the morning. 3.2 billion cfu    [provider]  prochlorperazine (COMPAZINE) 5 MG tablet TAKE ONE TABLET BY MOUTH every SIX hours AS NEEDED FOR NAUSEA AND VOMITING 09/19/22   [name], Kirsten, MD  ramelteon (ROZEREM) 8 MG tablet TAKE ONE TABLET BY MOUTH AT BEDTIME 01/14/23   [name], Kirsten, MD  rOPINIRole (REQUIP) 0.25 MG tablet TAKE ONE TABLET BY MOUTH AT BEDTIME 01/14/23   [name], Kirsten, MD  SYNJARDY 12.05-998 MG TABS TAKE ONE TABLET BY MOUTH TWICE DAILY 03/01/23   Marianne Sofia, PA-C  tolnaftate (TINACTIN) 1 % powder Apply 1 Application topically 2 (two) times daily for 14 days. 04/03/23 04/17/23  Barbaraann Share, DPM  TRESIBA FLEXTOUCH 200 UNIT/ML FlexTouch Pen  INJECT 60 UNITS SUBCUTANEOUSLY DAILY 12/27/22   [name], Kirsten, MD  Vitamin D, Ergocalciferol, (DRISDOL) 1.25 MG (50000 UNIT) CAPS capsule TAKE ONE CAPSULE BY MOUTH EVERY FRIDAY AT 8AM 01/14/23   CoxFritzi Mandes, MD  VRAYLAR 3 MG capsule TAKE ONE CAPSULE BY MOUTH EVERY DAY 03/14/23   [name], [name], MD  VYVANSE 70 MG capsule TAKE ONE CAPSULE BY MOUTH EVERY DAY 03/14/23   CoxFritzi Mandes, MD      Allergies    Codeine, Augmentin [amoxicillin-pot clavulanate], Celebrex [celecoxib], Propranolol, Propranolol hcl, Simvastatin, Vytorin [ezetimibe-simvastatin], and Zetia [ezetimibe]    Review of Systems   Review of Systems  All other systems reviewed and are negative.  Physical Exam Updated Vital Signs BP 117/89 (BP Location: Right Arm)   Pulse 96   Temp 98.5 F (36.9 C) (Oral)   Resp 20   LMP 06/13/2009   SpO2 96%  Physical Exam Vitals and nursing note reviewed.  Constitutional:      General: She is not in acute distress.    Appearance: Normal appearance. She is well-developed. She is not toxic-appearing.  HENT:     Head: Normocephalic and atraumatic.  Eyes:     General: Lids are normal.     Conjunctiva/sclera: Conjunctivae normal.     Pupils: Pupils are equal, round, and reactive to light.  Neck:     Thyroid: No thyroid mass.     Trachea: No tracheal deviation.  Cardiovascular:     Rate and Rhythm: Normal rate and regular rhythm.     Heart sounds: Normal heart sounds. No murmur heard.    No gallop.  Pulmonary:     Effort: Pulmonary effort is normal. No respiratory distress.     Breath sounds: Normal breath sounds. No stridor. No decreased breath sounds, wheezing, rhonchi or rales.  Abdominal:     General: There is no distension.     Palpations: Abdomen is soft.     Tenderness: There is no abdominal tenderness. There is no rebound.  Musculoskeletal:        General: No tenderness. Normal range of motion.     Cervical back: Normal range of motion and neck supple.     Comments: Left foot  without evidence of erythema or edema at the dorsal surface.  At the plantar surface at the junction of the toe and the MTP there is some erythema.  No purulent drainage noted.  Skin:    General: Skin is warm and dry.     Findings: No abrasion or rash.  Neurological:     Mental Status: She is alert and oriented to person, place, and time. Mental status is at baseline.     GCS: GCS eye subscore is 4. GCS verbal subscore is 5. GCS motor subscore is 6.     Cranial Nerves: No cranial nerve deficit.     Sensory: No sensory deficit.     Motor: Motor function is intact.  Psychiatric:        Attention and Perception: Attention normal.        Speech: Speech normal.        Behavior: Behavior normal.     ED Results / Procedures / Treatments   Labs (all labs ordered are listed, but only abnormal results are displayed) Labs Reviewed  CBC WITH DIFFERENTIAL/PLATELET  BASIC METABOLIC PANEL    EKG None  Radiology No results found.  Procedures Procedures    Medications Ordered in ED Medications - No data to display  ED Course/ Medical Decision Making/ A&P                                 Medical Decision Making Amount and/or Complexity of Data Reviewed Labs: ordered. Radiology: ordered.   Case discussed with her podiatrist who recommends admission to the hospitalist team.  Also recommend MRI of the foot and they will decide about operative management.  Will consult hospitalist team        Final Clinical Impression(s) / ED Diagnoses Final diagnoses:  None    Rx / DC Orders ED Discharge Orders     None  Lorre Nick, MD 04/07/23 [phone]

## 2023-04-07 NOTE — Patient Outreach (Signed)
  Care Management   Follow Up Note   04/07/2023 Name: Linda [name] MRN: [id number] DOB: 07/08/1970   Referred by: [name], MD Reason for referral : Care Management (RNCM: ATTEMPTED Follow Up For Chronic Disease Management & Care Coordination Needs/)   An unsuccessful telephone outreach was attempted today. The patient was referred to the case management team for assistance with care management and care coordination.   Follow Up Plan: The care management team will reach out to the patient again over the next 7-14 days.   Larey Brick, BSN RN [facility], [facility] RN Care Manager Direct Dial: [phone]  Fax: [phone]

## 2023-04-08 DIAGNOSIS — E118 Type 2 diabetes mellitus with unspecified complications: Secondary | ICD-10-CM

## 2023-04-08 LAB — COMPREHENSIVE METABOLIC PANEL
ALT: 12 U/L (ref 0–44)
AST: 17 U/L (ref 15–41)
Albumin: 2.6 g/dL — ABNORMAL LOW (ref 3.5–5.0)
Alkaline Phosphatase: 62 U/L (ref 38–126)
Anion gap: 8 (ref 5–15)
BUN: 8 mg/dL (ref 6–20)
CO2: 23 mmol/L (ref 22–32)
Calcium: 7.9 mg/dL — ABNORMAL LOW (ref 8.9–10.3)
Chloride: 108 mmol/L (ref 98–111)
Creatinine, Ser: 0.96 mg/dL (ref 0.44–1.00)
GFR, Estimated: >60 mL/min (ref >60)
Glucose, Bld: 180 mg/dL — ABNORMAL HIGH (ref 70–99)
Potassium: 3.7 mmol/L (ref 3.5–5.1)
Sodium: 139 mmol/L (ref 135–145)
Total Bilirubin: 0.5 mg/dL (ref 0.0–1.2)
Total Protein: 5.3 g/dL — ABNORMAL LOW (ref 6.5–8.1)

## 2023-04-08 LAB — CBC
HCT: 33.6 % — ABNORMAL LOW (ref 36.0–46.0)
Hemoglobin: 10.4 g/dL — ABNORMAL LOW (ref 12.0–15.0)
MCH: 25.9 pg — ABNORMAL LOW (ref 26.0–34.0)
MCHC: 31 g/dL (ref 30.0–36.0)
MCV: 83.8 fL (ref 80.0–100.0)
Platelets: 64 10*3/uL — ABNORMAL LOW (ref 150–400)
RBC: 4.01 MIL/uL (ref 3.87–5.11)
RDW: 14.6 % (ref 11.5–15.5)
WBC: 3 10*3/uL — ABNORMAL LOW (ref 4.0–10.5)
nRBC: 0 % (ref 0.0–0.2)

## 2023-04-08 LAB — GLUCOSE, CAPILLARY
Glucose-Capillary: 189 mg/dL — ABNORMAL HIGH (ref 70–99)
Glucose-Capillary: 138 mg/dL — ABNORMAL HIGH (ref 70–99)
Glucose-Capillary: 122 mg/dL — ABNORMAL HIGH (ref 70–99)
Glucose-Capillary: 153 mg/dL — ABNORMAL HIGH (ref 70–99)
Glucose-Capillary: 160 mg/dL — ABNORMAL HIGH (ref 70–99)

## 2023-04-08 LAB — TYPE AND SCREEN
ABO/RH(D): A NEG
Antibody Screen: NEGATIVE

## 2023-04-08 MED ORDER — VANCOMYCIN HCL IN DEXTROSE 1-5 GM/200ML-% IV SOLN
1000.0000 mg | INTRAVENOUS | Status: DC
  Administered 2023-04-08 – 2023-04-10 (×3): 1000 mg via INTRAVENOUS
  Filled 2023-04-08 (×3): qty 200

## 2023-04-08 MED ORDER — MUPIROCIN 2 % EX OINT
1.0000 | TOPICAL_OINTMENT | Freq: Two times a day (BID) | CUTANEOUS | Status: DC
  Administered 2023-04-08 – 2023-04-12 (×8): 1 via NASAL
  Filled 2023-04-08: qty 44
  Filled 2023-04-08: qty 22

## 2023-04-08 MED ORDER — LISDEXAMFETAMINE DIMESYLATE 70 MG PO CAPS
70.0000 mg | ORAL_CAPSULE | Freq: Every day | ORAL | Status: DC
  Administered 2023-04-09 – 2023-04-11 (×3): 70 mg via ORAL
  Filled 2023-04-08 (×3): qty 1

## 2023-04-08 NOTE — Progress Notes (Signed)
 PROGRESS NOTE    Linda [name]  BJY:[id number] DOB: 1970-12-01 DOA: 04/07/2023 PCP: [name], MD    Brief Narrative:  53 year old with history of hypertension, hyperlipidemia, type 2 diabetes on insulin, neuropathy, previous multiple toe amputations due to diabetic foot ulcer sent from podiatry office for nonhealing left third toe wound.  Admitted.  Found to have osteomyelitis.  On antibiotics. For  surgical amputation.  Subjective: Patient seen and examined.  Denies any complaints.  She is n.p.o. and anticipating surgery today. Assessment & Plan:   Diabetic foot infection, failed outpatient therapy.  MRI consistent with osteomyelitis of the third toe. Continue IV vancomycin, Flagyl and cefepime today.  Blood cultures drawn.  Surgical cultures will be appreciated.  Adequate pain medications.  Patient will likely need third toe amputation to the clean margin.  Anticipate surgery today.  Will de-escalate antibiotics after surgery.  Type 2 diabetes: Well-controlled as per patient.  Recent A1c 6.7.  On high-dose insulin that is continued.  Chronic medical issues including Hypertension, stable Hyperlipidemia, on statin Neuropathy, on gabapentin Chronic anemia and thrombocytopenia secondary to cirrhosis, stable. Platelets are 64, white count is 3.  Historically at baseline. Chronic pain syndrome, on gabapentin and morphine. Depression, on amitriptyline, Wellbutrin, BuSpar, Vraylar and Fetzima.  Continue.  DVT prophylaxis: SCDs Start: 04/07/23 1847   Code Status: Full code Family Communication: None at bedside Disposition Plan: Status is: Inpatient Remains inpatient appropriate because: Inpatient surgery planned     Consultants:  Podiatry  Procedures:  None  Antimicrobials:  Vancomycin, Flagyl and cefepime 3/14--     Objective: Vitals:   04/08/23 0600 04/08/23 0654 04/08/23 0837 04/08/23 1054  BP: (!) 94/54 (!) 100/53 (!) 94/59   Pulse:  79 80   Resp:   18    Temp:   97.8 F (36.6 C)   TempSrc:   Oral   SpO2:   99%   Height:    5\' 2"  (1.575 m)    Intake/Output Summary (Last 24 hours) at 04/08/2023 1056 Last data filed at 04/08/2023 0900 Gross per 24 hour  Intake 0 ml  Output --  Net 0 ml   There were no vitals filed for this visit.  Examination:  General exam: Appears calm and comfortable  Respiratory system: Clear to auscultation. Respiratory effort normal. Cardiovascular system: S1 & S2 heard,  Gastrointestinal system: Soft nontender. Central nervous system: Alert and oriented. No focal neurological deficits. Extremities: Symmetric 5 x 5 power. Skin:  Right foot with multiple lost toes, clean and healthy.  Nontender. Left foot with drainage from plantar aspect of the third toe, red.    Data Reviewed: I have personally reviewed following labs and imaging studies  CBC: Recent Labs  Lab 04/07/23 1652 04/08/23 0751  WBC 6.8 3.0*  NEUTROABS 4.2  --   HGB 12.8 10.4*  HCT 41.5 33.6*  MCV 83.7 83.8  PLT 99* 64*   Basic Metabolic Panel: Recent Labs  Lab 04/07/23 1652 04/08/23 0751  NA 140 139  K 4.1 3.7  CL 108 108  CO2 22 23  GLUCOSE 104* 180*  BUN 9 8  CREATININE 1.42* 0.96  CALCIUM 8.8* 7.9*   GFR: Estimated Creatinine Clearance: 71 mL/min (by C-G formula based on SCr of 0.96 mg/dL). Liver Function Tests: Recent Labs  Lab 04/07/23 2129 04/08/23 0751  AST 20 17  ALT 13 12  ALKPHOS 73 62  BILITOT 0.6 0.5  PROT 6.2* 5.3*  ALBUMIN 3.2* 2.6*   No results for input(s): "  LIPASE", "AMYLASE" in the last 168 hours. No results for input(s): "AMMONIA" in the last 168 hours. Coagulation Profile: No results for input(s): "INR", "PROTIME" in the last 168 hours. Cardiac Enzymes: No results for input(s): "CKTOTAL", "CKMB", "CKMBINDEX", "TROPONINI" in the last 168 hours. BNP (last 3 results) No results for input(s): "PROBNP" in the last 8760 hours. HbA1C: No results for input(s): "HGBA1C" in the last 72  hours. CBG: Recent Labs  Lab 04/07/23 2155 04/08/23 0838  GLUCAP 189* 138*   Lipid Profile: No results for input(s): "CHOL", "HDL", "LDLCALC", "TRIG", "CHOLHDL", "LDLDIRECT" in the last 72 hours. Thyroid Function Tests: No results for input(s): "TSH", "T4TOTAL", "FREET4", "T3FREE", "THYROIDAB" in the last 72 hours. Anemia Panel: No results for input(s): "VITAMINB12", "FOLATE", "FERRITIN", "TIBC", "IRON", "RETICCTPCT" in the last 72 hours. Sepsis Labs: No results for input(s): "PROCALCITON", "LATICACIDVEN" in the last 168 hours.  No results found for this or any previous visit (from the past 240 hours).       Radiology Studies: MR FOOT LEFT WO CONTRAST Result Date: 04/07/2023 CLINICAL DATA:  Osteonecrosis suspected, foot, xray done EXAM: MRI OF THE LEFT FOOT WITHOUT CONTRAST TECHNIQUE: Multiplanar, multisequence MR imaging of the left forefoot was performed. No intravenous contrast was administered. COMPARISON:  X-ray 04/03/2023 FINDINGS: Bones/Joint/Cartilage Bone marrow edema and confluent low T1 marrow signal changes throughout the proximal, middle, and distal phalanx of the fourth toe, compatible with acute osteomyelitis. Trace fourth MTP joint effusion. No convincing marrow edema within the fourth metatarsal head. Postsurgical changes to the left foot including prior great toe amputations and partial amputations of the second and third toes. No fracture or dislocation. Mild degenerative changes. Ligaments Intact Lisfranc ligament.  No collateral ligament injury. Muscles and Tendons Denervation changes of the foot musculature and prior amputation changes distally. No tenosynovitis. Soft tissues Soft tissue swelling and edema of the fourth toe. No organized or drainable fluid collections. IMPRESSION: 1. Acute osteomyelitis of the fourth toe involving the proximal, middle, and distal phalanx. 2. Trace fourth MTP joint effusion, which may be reactive or represent septic arthritis.  Electronically Signed   By: Duanne Guess D.O.   On: 04/07/2023 19:51        Scheduled Meds:  amitriptyline  25 mg Oral QHS   atorvastatin  10 mg Oral QPM   buPROPion  300 mg Oral q morning   busPIRone  5 mg Oral TID   cariprazine  3 mg Oral Daily   gabapentin  600 mg Oral TID   insulin aspart  0-15 Units Subcutaneous TID WC   insulin aspart  0-5 Units Subcutaneous QHS   insulin glargine  50 Units Subcutaneous QHS   Levomilnacipran HCl ER  1 capsule Oral QPM   levothyroxine  75 mcg Oral Q0600   lidocaine  3 patch Transdermal Q24H   lisdexamfetamine  70 mg Oral Daily   losartan  50 mg Oral QPM   metoprolol succinate  12.5 mg Oral Daily   morphine  30 mg Oral Q12H   mupirocin ointment  1 Application Nasal BID   pantoprazole  40 mg Oral BID   ramelteon  8 mg Oral QHS   rOPINIRole  0.25 mg Oral QHS   sodium chloride flush  3 mL Intravenous Q12H   Continuous Infusions:  sodium chloride 125 mL/hr at 04/08/23 0556   ceFEPime (MAXIPIME) IV 2 g (04/08/23 0656)   metronidazole 500 mg (04/08/23 0601)   vancomycin       LOS: 1 day  Time spent: 40 minutes    Dorcas Carrow, MD [facility]

## 2023-04-08 NOTE — Plan of Care (Signed)

## 2023-04-08 NOTE — Progress Notes (Signed)
 Pharmacy Antibiotic Note  Linda [name] is a 53 y.o. female admitted on 04/07/2023 with  wound infectoin .  Pharmacy has been consulted for vancomycin and cefepime dosing.  Renal function improved overnight 1.42>>0.96. BL appears to be ~1.   Plan: Adjust Cefepime to 2g IV Q8H  Adjust Vancomycin to 1,000 mg IV Q24H (Scr used: 0.96, Vd used: 0.5, eAUC: 447) Flagyl per MD.  Culture data for de-escalation.  Monitor renal function for dose adjustments as indicated.      Temp (24hrs), Avg:98.4 F (36.9 C), Min:97.8 F (36.6 C), Max:99.7 F (37.6 C)  Recent Labs  Lab 04/07/23 1652 04/08/23 0751  WBC 6.8 3.0*  CREATININE 1.42* 0.96    Estimated Creatinine Clearance: 71 mL/min (by C-G formula based on SCr of 0.96 mg/dL).    Allergies  Allergen Reactions   Codeine Shortness Of Breath and Other (See Comments)    Other reaction(s): SHOB    Augmentin [Amoxicillin-Pot Clavulanate] Diarrhea   Celebrex [Celecoxib] Other (See Comments)    Unknown reaction   Propranolol Other (See Comments)    Other reaction(s): Unknown   Propranolol Hcl Other (See Comments)    Unknown reaction  ask   Simvastatin Other (See Comments)    Other reaction(s): Unknown   Vytorin [Ezetimibe-Simvastatin] Other (See Comments)    Unknown reaction  Patient is not aware of an allergy to this medication, ask   Zetia [Ezetimibe] Other (See Comments)    Other reaction(s): Unknown    Antimicrobials this admission: Vancomycin 3/14 >>  Cefepime 3/14 >>  Flagyl 3/14 >>   Thank you for allowing pharmacy to be a part of this patient's care.  Jani Gravel, PharmD Clinical Pharmacist  04/08/2023 9:21 AM

## 2023-04-08 NOTE — Consult Note (Signed)
 PODIATRY CONSULTATION  NAME Linda [name] MRN [id number] DOB [birth year] DOA 04/07/2023   Reason for consult:  Chief Complaint  Patient presents with   Wound Check    Attending/Consulting physician: Dr. Dorcas Carrow  History of present illness: 53 y.o. female with past medical history of hypertension, hyperlipidemia, type 2 diabetes mellitus, neuropathy, previous multiple toe amputations, liver cirrhosis, thrombocytopenia, history of breast cancer who was admitted for left foot infection.  Patient is well-known to me and has been following my office for wound care.  She has had worsening appearance of left fourth toe ulceration despite course of oral antibiotics and wound care.  Over the past several days, the patient grew concerned with worsening appearance of the fourth toe as well as experiencing subjective fever.  Yesterday she saw one of my partners in the office who advised that she come to the hospital.  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 2 diabetes mellitus, with fat layer exposed (HCC) 03/27/2020   Gastritis    Genetic susceptibility to malignant neoplasm of breast    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 No longer has       Latest Ref Rng & Units 04/08/2023    7:51 AM 04/07/2023    4:52 PM 03/29/2023   10:18 AM  CBC  WBC 4.0 - 10.5 K/uL 3.0  6.8  5.5   Hemoglobin 12.0 - 15.0 g/dL 40.9  81.1  91.4   Hematocrit 36.0 - 46.0 % 33.6  41.5  39.9   Platelets 150 - 400 K/uL 64  99  80        Latest Ref Rng & Units 04/08/2023    7:51 AM 04/07/2023    4:52 PM 03/29/2023   10:18 AM  BMP  Glucose 70 - 99 mg/dL 782  956  213   BUN 6 - 20 mg/dL 8  9  7    Creatinine 0.44 - 1.00 mg/dL 0.86  5.78  4.69   Sodium 135 - 145 mmol/L 139  140  137   Potassium 3.5 - 5.1 mmol/L 3.7  4.1  3.9   Chloride 98 - 111 mmol/L 108  108  99   CO2 22 - 32 mmol/L 23  22  26    Calcium 8.9 - 10.3 mg/dL 7.9  8.8  9.3       Physical Exam: Lower Extremity Exam Vasc: R - PT palpable, DP palpable. Cap refill < 3 sec to digits  L - PT palpable, DP palpable. Cap refill <3 sec to digits  Derm: R - Normal temp/texture/turgor with no open lesion or clinical signs of infection / Did have prior ulceration to the right 4th toe which appears healed today  L -left fourth toe ulceration at the level of the plantar sulcus with serous drainage, granulation tissue present with areas fibrous slough as well.  Left fourth toe edematous and mildly erythematous  MSK:  R -prior amputations of the first, second, third toes. Compartments soft, non-tender, compressible. Rigid hammertoe contractures noted.  L - Prior first toe amputation, partial 2nd and 3rd toe amputations. Less than 10 degrees of ankle joint dorsiflexion noted with knee extended  Neuro: Protective sensation absent.  Gross motor function intact  MRI left foot: IMPRESSION: 1. Acute osteomyelitis of the fourth toe involving the proximal, middle,  and distal phalanx. 2. Trace fourth MTP joint effusion, which may be reactive or represent septic arthritis.  ASSESSMENT/PLAN OF CARE 53 y.o. female with PMHx significant for  hypertension, hyperlipidemia, type 2 diabetes mellitus, neuropathy, previous multiple toe amputations, liver cirrhosis, thrombocytopenia, history of breast cancer admitted for left foot osteomyelitis of 4th toe  -Clinical findings and treatment plan discussed with patient.  Reviewed MRI findings -Due to prior toe amputations and findings of osteomyelitis on MRI of  4th toe with suspicion for involvement of the MPJ, patient would benefit from left foot transmetatarsal amputation. Would also perform tendo achilles lengthening due to tight heel cord. -Plan to proceed with this 3/16 -This procedure has been fully reviewed with the patient and written informed consent has been obtained.  Discussion of risk, benefits alternative therapies were discussed with patient at length.  Potential risks including pain, bleeding, infection, deformity, need for more surgery, wound healing complications, limb loss and death.  Patient expressed good understanding and would like to proceed - Continue IV abx broad spectrum pending further culture data -Betadine wet-to-dry gauze dressing applied today -Will need to maintain nonweightbearing left foot following surgery -Patient does appear to have good circulation, obtaining baseline noninvasive vascular studies due to history of ulcerations and digital amputations -Consider Transfusion of platelets tonight -NPO at midnight tonight - Will continue to follow   Thank you for the consult.  Please contact me directly with any questions or concerns.           Bronwen Betters, DPM [facility]    [address] [place], [place] [address]                Office [phone]  Fax [phone]

## 2023-04-09 ENCOUNTER — Inpatient Hospital Stay (HOSPITAL_COMMUNITY): Admitting: Anesthesiology

## 2023-04-09 ENCOUNTER — Encounter (HOSPITAL_COMMUNITY): Payer: Self-pay | Admitting: Internal Medicine

## 2023-04-09 ENCOUNTER — Other Ambulatory Visit: Payer: Self-pay

## 2023-04-09 ENCOUNTER — Inpatient Hospital Stay (HOSPITAL_COMMUNITY)

## 2023-04-09 ENCOUNTER — Encounter (HOSPITAL_COMMUNITY)
Admission: EM | Disposition: A | Discharge status: Home Health | Payer: Self-pay | Source: Ambulatory Visit | Attending: Internal Medicine

## 2023-04-09 ENCOUNTER — Encounter: Payer: Self-pay | Admitting: Hematology and Oncology

## 2023-04-09 DIAGNOSIS — E039 Hypothyroidism, unspecified: Secondary | ICD-10-CM | POA: Diagnosis not present

## 2023-04-09 DIAGNOSIS — E118 Type 2 diabetes mellitus with unspecified complications: Secondary | ICD-10-CM | POA: Diagnosis not present

## 2023-04-09 DIAGNOSIS — I10 Essential (primary) hypertension: Secondary | ICD-10-CM | POA: Diagnosis not present

## 2023-04-09 DIAGNOSIS — E1169 Type 2 diabetes mellitus with other specified complication: Secondary | ICD-10-CM

## 2023-04-09 HISTORY — PX: TRANSMETATARSAL AMPUTATION: SHX6197

## 2023-04-09 HISTORY — PX: ACHILLES TENDON LENGTHENING: SHX6455

## 2023-04-09 LAB — GLUCOSE, CAPILLARY
Glucose-Capillary: 147 mg/dL — ABNORMAL HIGH (ref 70–99)
Glucose-Capillary: 132 mg/dL — ABNORMAL HIGH (ref 70–99)
Glucose-Capillary: 143 mg/dL — ABNORMAL HIGH (ref 70–99)
Glucose-Capillary: 263 mg/dL — ABNORMAL HIGH (ref 70–99)
Glucose-Capillary: 170 mg/dL — ABNORMAL HIGH (ref 70–99)

## 2023-04-09 LAB — SURGICAL PCR SCREEN
MRSA, PCR: NEGATIVE
Staphylococcus aureus: NEGATIVE

## 2023-04-09 SURGERY — AMPUTATION, FOOT, TRANSMETATARSAL
Anesthesia: General | Site: Toe | Laterality: Left

## 2023-04-09 MED ORDER — DEXAMETHASONE SODIUM PHOSPHATE 10 MG/ML IJ SOLN
INTRAMUSCULAR | Status: AC
  Filled 2023-04-09: qty 1

## 2023-04-09 MED ORDER — MIDAZOLAM HCL 2 MG/2ML IJ SOLN
INTRAMUSCULAR | Status: AC
  Filled 2023-04-09: qty 2

## 2023-04-09 MED ORDER — PROPOFOL 1000 MG/100ML IV EMUL
INTRAVENOUS | Status: AC
  Filled 2023-04-09: qty 100

## 2023-04-09 MED ORDER — PROPOFOL 10 MG/ML IV BOLUS
INTRAVENOUS | Status: DC | PRN
  Administered 2023-04-09: 200 mg via INTRAVENOUS

## 2023-04-09 MED ORDER — LIDOCAINE HCL 2 % IJ SOLN
INTRAMUSCULAR | Status: DC | PRN
  Administered 2023-04-09: 20 mL

## 2023-04-09 MED ORDER — CHLORHEXIDINE GLUCONATE 0.12 % MT SOLN
OROMUCOSAL | Status: AC
  Administered 2023-04-09: 15 mL via OROMUCOSAL
  Filled 2023-04-09: qty 15

## 2023-04-09 MED ORDER — ONDANSETRON HCL 4 MG/2ML IJ SOLN
INTRAMUSCULAR | Status: AC
  Filled 2023-04-09: qty 2

## 2023-04-09 MED ORDER — DEXMEDETOMIDINE HCL IN NACL 80 MCG/20ML IV SOLN
INTRAVENOUS | Status: DC | PRN
Start: 2023-04-09 — End: 2023-04-09
  Administered 2023-04-09: 12 ug via INTRAVENOUS

## 2023-04-09 MED ORDER — ROCURONIUM BROMIDE 10 MG/ML (PF) SYRINGE
PREFILLED_SYRINGE | INTRAVENOUS | Status: AC
  Filled 2023-04-09: qty 10

## 2023-04-09 MED ORDER — ONDANSETRON HCL 4 MG/2ML IJ SOLN
INTRAMUSCULAR | Status: DC | PRN
  Administered 2023-04-09: 4 mg via INTRAVENOUS

## 2023-04-09 MED ORDER — LIDOCAINE HCL 2 % IJ SOLN
INTRAMUSCULAR | Status: AC
  Filled 2023-04-09: qty 20

## 2023-04-09 MED ORDER — ORAL CARE MOUTH RINSE: 15.0000 mL | Freq: Once | OROMUCOSAL | Status: AC

## 2023-04-09 MED ORDER — PHENYLEPHRINE HCL-NACL 20-0.9 MG/250ML-% IV SOLN
INTRAVENOUS | Status: AC
  Filled 2023-04-09: qty 250

## 2023-04-09 MED ORDER — BUPIVACAINE HCL (PF) 0.5 % IJ SOLN
INTRAMUSCULAR | Status: AC
  Filled 2023-04-09: qty 30

## 2023-04-09 MED ORDER — AMISULPRIDE (ANTIEMETIC) 5 MG/2ML IV SOLN: 10.0000 mg | Freq: Once | INTRAVENOUS | Status: DC | PRN

## 2023-04-09 MED ORDER — OXYCODONE HCL 5 MG PO TABS
5.0000 mg | ORAL_TABLET | ORAL | Status: DC | PRN
  Administered 2023-04-09 – 2023-04-12 (×5): 5 mg via ORAL
  Filled 2023-04-09 (×5): qty 1

## 2023-04-09 MED ORDER — LIDOCAINE 2% (20 MG/ML) 5 ML SYRINGE
INTRAMUSCULAR | Status: DC | PRN
  Administered 2023-04-09: 40 mg via INTRAVENOUS

## 2023-04-09 MED ORDER — PROPOFOL 10 MG/ML IV BOLUS
INTRAVENOUS | Status: AC
  Filled 2023-04-09: qty 20

## 2023-04-09 MED ORDER — FENTANYL CITRATE (PF) 250 MCG/5ML IJ SOLN
INTRAMUSCULAR | Status: AC
  Filled 2023-04-09: qty 5

## 2023-04-09 MED ORDER — CHLORHEXIDINE GLUCONATE 0.12 % MT SOLN: 15.0000 mL | Freq: Once | OROMUCOSAL | Status: AC

## 2023-04-09 MED ORDER — DEXAMETHASONE SODIUM PHOSPHATE 10 MG/ML IJ SOLN
INTRAMUSCULAR | Status: DC | PRN
  Administered 2023-04-09: 4 mg via INTRAVENOUS

## 2023-04-09 MED ORDER — HYDROMORPHONE HCL 1 MG/ML IJ SOLN
1.0000 mg | INTRAMUSCULAR | Status: DC | PRN
  Administered 2023-04-09 (×2): 1 mg via INTRAVENOUS
  Filled 2023-04-09 (×2): qty 1

## 2023-04-09 MED ORDER — BUPIVACAINE HCL 0.5 % IJ SOLN
INTRAMUSCULAR | Status: DC | PRN
  Administered 2023-04-09: 20 mL

## 2023-04-09 MED ORDER — FENTANYL CITRATE (PF) 100 MCG/2ML IJ SOLN
25.0000 ug | INTRAMUSCULAR | Status: DC | PRN
  Administered 2023-04-09 (×5): 50 ug via INTRAVENOUS

## 2023-04-09 MED ORDER — LACTATED RINGERS IV SOLN: INTRAVENOUS | Status: DC

## 2023-04-09 MED ORDER — INSULIN ASPART 100 UNIT/ML IJ SOLN
0.0000 [IU] | INTRAMUSCULAR | Status: DC | PRN
  Administered 2023-04-09: 2 [IU] via SUBCUTANEOUS

## 2023-04-09 MED ORDER — IBUPROFEN 600 MG PO TABS: 600.0000 mg | ORAL_TABLET | Freq: Four times a day (QID) | ORAL | Status: DC | PRN

## 2023-04-09 MED ORDER — PROPOFOL 500 MG/50ML IV EMUL
INTRAVENOUS | Status: AC
  Filled 2023-04-09: qty 50

## 2023-04-09 MED ORDER — LIDOCAINE 2% (20 MG/ML) 5 ML SYRINGE
INTRAMUSCULAR | Status: AC
  Filled 2023-04-09: qty 5

## 2023-04-09 MED ORDER — PHENYLEPHRINE HCL-NACL 20-0.9 MG/250ML-% IV SOLN
INTRAVENOUS | Status: DC | PRN
  Administered 2023-04-09: 35 ug/min via INTRAVENOUS

## 2023-04-09 MED ORDER — MIDAZOLAM HCL 2 MG/2ML IJ SOLN
INTRAMUSCULAR | Status: DC | PRN
  Administered 2023-04-09: 1 mg via INTRAVENOUS

## 2023-04-09 SURGICAL SUPPLY — 32 items
BLADE LONG MED 31X9 (MISCELLANEOUS) IMPLANT
BNDG ELASTIC 4INX 5YD STR LF (GAUZE/BANDAGES/DRESSINGS) IMPLANT
BNDG GAUZE DERMACEA FLUFF 4 (GAUZE/BANDAGES/DRESSINGS) IMPLANT
COVER SURGICAL LIGHT HANDLE (MISCELLANEOUS) ×2 IMPLANT
CUFF TRNQT CYL 24X4X16.5-23 (TOURNIQUET CUFF) IMPLANT
DRAPE SURG 17X23 STRL (DRAPES) ×2 IMPLANT
GAUZE PAD ABD 8X10 STRL (GAUZE/BANDAGES/DRESSINGS) IMPLANT
GAUZE SPONGE 2X2 8PLY STRL LF (GAUZE/BANDAGES/DRESSINGS) IMPLANT
GAUZE SPONGE 4X4 12PLY STRL (GAUZE/BANDAGES/DRESSINGS) IMPLANT
GAUZE XEROFORM 1X8 LF (GAUZE/BANDAGES/DRESSINGS) IMPLANT
GAUZE XEROFORM 5X9 LF (GAUZE/BANDAGES/DRESSINGS) IMPLANT
GLOVE BIOGEL M 7.0 STRL (GLOVE) ×2 IMPLANT
GLOVE PI ORTHO PRO STRL 7.5 (GLOVE) ×2 IMPLANT
GOWN STRL REUS W/ TWL LRG LVL3 (GOWN DISPOSABLE) ×4 IMPLANT
KIT BASIN OR (CUSTOM PROCEDURE TRAY) ×2 IMPLANT
KIT TURNOVER KIT B (KITS) ×2 IMPLANT
NDL HYPO 25GX1X1/2 BEV (NEEDLE) IMPLANT
NEEDLE HYPO 25GX1X1/2 BEV (NEEDLE) IMPLANT
NS IRRIG 1000ML POUR BTL (IV SOLUTION) ×2 IMPLANT
PACK ORTHO EXTREMITY (CUSTOM PROCEDURE TRAY) ×2 IMPLANT
PAD ARMBOARD POSITIONER FOAM (MISCELLANEOUS) ×4 IMPLANT
PADDING CAST ABS COTTON 4X4 ST (CAST SUPPLIES) IMPLANT
SOL PREP POV-IOD 4OZ 10% (MISCELLANEOUS) ×6 IMPLANT
SPECIMEN JAR SMALL (MISCELLANEOUS) ×2 IMPLANT
SPLINT FIBERGLASS 4X30 (CAST SUPPLIES) IMPLANT
SUT ETHILON 3 0 PS 1 (SUTURE) ×2 IMPLANT
SYR CONTROL 10ML LL (SYRINGE) IMPLANT
TOWEL GREEN STERILE (TOWEL DISPOSABLE) ×2 IMPLANT
TOWEL GREEN STERILE FF (TOWEL DISPOSABLE) ×2 IMPLANT
TUBE CONNECTING 12X1/4 (SUCTIONS) IMPLANT
UNDERPAD 30X36 HEAVY ABSORB (UNDERPADS AND DIAPERS) ×2 IMPLANT
WATER STERILE IRR 1000ML POUR (IV SOLUTION) ×2 IMPLANT

## 2023-04-09 NOTE — Progress Notes (Signed)
   04/09/23 1018  Vitals  Temp 98.1 F (36.7 C)  Temp Source Oral  BP 124/73  MAP (mmHg) 89  BP Location Right Arm  BP Method Automatic  Patient Position (if appropriate) Lying  Pulse Rate 93  Pulse Rate Source Monitor  Resp 17  Level of Consciousness  Level of Consciousness Alert  MEWS COLOR  MEWS Score Color Green  Oxygen Therapy  SpO2 99 %  O2 Device Nasal Cannula  O2 Flow Rate (L/min) 2 L/min  Pain Assessment  Pain Scale 0-10  Pain Score 3  MEWS Score  MEWS Temp 0  MEWS Systolic 0  MEWS Pulse 0  MEWS RR 0  MEWS LOC 0  MEWS Score 0

## 2023-04-09 NOTE — Progress Notes (Signed)
 PROGRESS NOTE    Linda [name]  QMV:[id number] DOB: 12/05/1970 DOA: 04/07/2023 PCP: [name], MD    Brief Narrative:  53 year old with history of hypertension, hyperlipidemia, type 2 diabetes on insulin, neuropathy, previous multiple toe amputations due to diabetic foot ulcer sent from podiatry office for nonhealing left third toe wound.  Admitted.  Found to have osteomyelitis.  On antibiotics. For  surgical amputation.  Subjective:  Patient seen and examined.  Came back from left TMA.  Sleepy now.  Denies any complaints.  Assessment & Plan:   Diabetic foot infection, failed outpatient therapy.  MRI consistent with osteomyelitis of the third toe. Status post left-sided TMA 3/16.  Surgical cultures pending.  Blood cultures negative so far. Postop management as per surgery. Will continue IV vancomycin, Flagyl and cefepime today, however anticipated surgery to clean margin may be able to de-escalate sooner.  Will continue 24 hours post procedure today. Adequate pain medications.  Anticipating therapies when cleared by surgery.  Type 2 diabetes: Well-controlled as per patient.  Recent A1c 6.7.  On high-dose insulin that is continued.  Chronic medical issues including Hypertension, stable Hyperlipidemia, on statin Neuropathy, on gabapentin Chronic anemia and thrombocytopenia secondary to cirrhosis, stable. Platelets are 64, white count is 3.  Historically at baseline. Chronic pain syndrome, on gabapentin and morphine. Depression, on amitriptyline, Wellbutrin, BuSpar, Vraylar and Fetzima.  Continue.  DVT prophylaxis: SCDs Start: 04/07/23 1847   Code Status: Full code Family Communication: None at bedside Disposition Plan: Status is: Inpatient Remains inpatient appropriate because: Immediate postop     Consultants:  Podiatry  Procedures:  None  Antimicrobials:  Vancomycin, Flagyl and cefepime 3/14--     Objective: Vitals:   04/09/23 0655 04/09/23 0916  04/09/23 0930 04/09/23 0945  BP: 114/79 127/80 131/77 126/80  Pulse: 90 98 (!) 107 99  Resp: 16 13 16 13   Temp: 98 F (36.7 C) 98.4 F (36.9 C)  98 F (36.7 C)  TempSrc: Oral     SpO2: 94% 96% 99% 97%  Weight:      Height:        Intake/Output Summary (Last 24 hours) at 04/09/2023 1016 Last data filed at 04/09/2023 0904 Gross per 24 hour  Intake 1498 ml  Output 50 ml  Net 1448 ml   Filed Weights   04/08/23 1057  Weight: 93.9 kg    Examination:  General exam: Appears calm and comfortable .  Immediate postop. Respiratory system: Clear to auscultation. Respiratory effort normal. Cardiovascular system: S1 & S2 heard,  Gastrointestinal system: Soft nontender. Central nervous system: Alert and oriented. No focal neurological deficits. Extremities: Symmetric 5 x 5 power. Skin:  Right foot with multiple lost toes, clean and healthy.  Nontender. Left foot immediate postop.    Data Reviewed: I have personally reviewed following labs and imaging studies  CBC: Recent Labs  Lab 04/07/23 1652 04/08/23 0751  WBC 6.8 3.0*  NEUTROABS 4.2  --   HGB 12.8 10.4*  HCT 41.5 33.6*  MCV 83.7 83.8  PLT 99* 64*   Basic Metabolic Panel: Recent Labs  Lab 04/07/23 1652 04/08/23 0751  NA 140 139  K 4.1 3.7  CL 108 108  CO2 22 23  GLUCOSE 104* 180*  BUN 9 8  CREATININE 1.42* 0.96  CALCIUM 8.8* 7.9*   GFR: Estimated Creatinine Clearance: 71.5 mL/min (by C-G formula based on SCr of 0.96 mg/dL). Liver Function Tests: Recent Labs  Lab 04/07/23 2129 04/08/23 0751  AST 20 17  ALT  13 12  ALKPHOS 73 62  BILITOT 0.6 0.5  PROT 6.2* 5.3*  ALBUMIN 3.2* 2.6*   No results for input(s): "LIPASE", "AMYLASE" in the last 168 hours. No results for input(s): "AMMONIA" in the last 168 hours. Coagulation Profile: No results for input(s): "INR", "PROTIME" in the last 168 hours. Cardiac Enzymes: No results for input(s): "CKTOTAL", "CKMB", "CKMBINDEX", "TROPONINI" in the last 168  hours. BNP (last 3 results) No results for input(s): "PROBNP" in the last 8760 hours. HbA1C: No results for input(s): "HGBA1C" in the last 72 hours. CBG: Recent Labs  Lab 04/08/23 1207 04/08/23 1559 04/08/23 2118 04/09/23 0654 04/09/23 0919  GLUCAP 122* 153* 160* 147* 132*   Lipid Profile: No results for input(s): "CHOL", "HDL", "LDLCALC", "TRIG", "CHOLHDL", "LDLDIRECT" in the last 72 hours. Thyroid Function Tests: No results for input(s): "TSH", "T4TOTAL", "FREET4", "T3FREE", "THYROIDAB" in the last 72 hours. Anemia Panel: No results for input(s): "VITAMINB12", "FOLATE", "FERRITIN", "TIBC", "IRON", "RETICCTPCT" in the last 72 hours. Sepsis Labs: No results for input(s): "PROCALCITON", "LATICACIDVEN" in the last 168 hours.  Recent Results (from the past 240 hours)  Surgical pcr screen     Status: None   Collection Time: 04/09/23  6:12 AM   Specimen: Nasal Mucosa; Nasal Swab  Result Value Ref Range Status   MRSA, PCR NEGATIVE NEGATIVE Final   Staphylococcus aureus NEGATIVE NEGATIVE Final    Comment: (NOTE) The Xpert SA Assay (FDA approved for NASAL specimens in patients 47 years of age and older), is one component of a comprehensive surveillance program. It is not intended to diagnose infection nor to guide or monitor treatment. Performed at [facility], [address], [place], [place] [address]          Radiology Studies: DG Foot Complete Left Result Date: 04/09/2023 CLINICAL DATA:  Status post transmetatarsal amputation of left foot. EXAM: LEFT FOOT - COMPLETE 3+ VIEW COMPARISON:  Left foot radiographs 04/03/2023, MRI left forefoot 04/07/2023 FINDINGS: Interval first through fifth ray amputation to the proximal metatarsal shaft. Amputation margins are sharp. There are distal soft tissues surgical skin staples. Mild plantar and posterior calcaneal heel spurs. No subcutaneous air. No acute fracture or dislocation. IMPRESSION: Interval first through fifth ray  amputation to the proximal metatarsal shaft. Electronically Signed   By: Neita Garnet M.D.   On: 04/09/2023 10:07   MR FOOT LEFT WO CONTRAST Result Date: 04/07/2023 CLINICAL DATA:  Osteonecrosis suspected, foot, xray done EXAM: MRI OF THE LEFT FOOT WITHOUT CONTRAST TECHNIQUE: Multiplanar, multisequence MR imaging of the left forefoot was performed. No intravenous contrast was administered. COMPARISON:  X-ray 04/03/2023 FINDINGS: Bones/Joint/Cartilage Bone marrow edema and confluent low T1 marrow signal changes throughout the proximal, middle, and distal phalanx of the fourth toe, compatible with acute osteomyelitis. Trace fourth MTP joint effusion. No convincing marrow edema within the fourth metatarsal head. Postsurgical changes to the left foot including prior great toe amputations and partial amputations of the second and third toes. No fracture or dislocation. Mild degenerative changes. Ligaments Intact Lisfranc ligament.  No collateral ligament injury. Muscles and Tendons Denervation changes of the foot musculature and prior amputation changes distally. No tenosynovitis. Soft tissues Soft tissue swelling and edema of the fourth toe. No organized or drainable fluid collections. IMPRESSION: 1. Acute osteomyelitis of the fourth toe involving the proximal, middle, and distal phalanx. 2. Trace fourth MTP joint effusion, which may be reactive or represent septic arthritis. Electronically Signed   By: Duanne Guess D.O.   On:  04/07/2023 19:51        Scheduled Meds:  amitriptyline  25 mg Oral QHS   atorvastatin  10 mg Oral QPM   buPROPion  300 mg Oral q morning   busPIRone  5 mg Oral TID   cariprazine  3 mg Oral Daily   gabapentin  600 mg Oral TID   insulin aspart  0-15 Units Subcutaneous TID WC   insulin aspart  0-5 Units Subcutaneous QHS   insulin glargine  50 Units Subcutaneous QHS   Levomilnacipran HCl ER  1 capsule Oral QPM   levothyroxine  75 mcg Oral Q0600   lidocaine  3 patch  Transdermal Q24H   lisdexamfetamine  70 mg Oral QHS   losartan  50 mg Oral QPM   metoprolol succinate  12.5 mg Oral Daily   morphine  30 mg Oral Q12H   mupirocin ointment  1 Application Nasal BID   pantoprazole  40 mg Oral BID   ramelteon  8 mg Oral QHS   rOPINIRole  0.25 mg Oral QHS   sodium chloride flush  3 mL Intravenous Q12H   Continuous Infusions:  ceFEPime (MAXIPIME) IV 2 g (04/09/23 0615)   metronidazole 500 mg (04/09/23 0554)   vancomycin Stopped (04/08/23 2002)     LOS: 2 days    Time spent: 40 minutes    Dorcas Carrow, MD [facility]

## 2023-04-09 NOTE — Progress Notes (Signed)
 History and Physical Interval Note:  04/09/2023 6:37 AM  Linda [name]  has presented today for surgery, with the diagnosis of Left Foot Osteomyelitis.  The various methods of treatment have been discussed with the patient and family. After consideration of risks, benefits and other options for treatment, the patient has consented to   Procedure(s): AMPUTATION, FOOT, TRANSMETATARSAL, Achilles lengthening (Left) as a surgical intervention.  The patient's history has been reviewed, patient examined, no change in status, stable for surgery.  I have reviewed the patient's chart and labs.  Questions were answered to the patient's satisfaction.     Barbaraann Share, DPM

## 2023-04-09 NOTE — Plan of Care (Signed)
  Problem: Education: Goal: Ability to describe self-care measures that may prevent or decrease complications (Diabetes Survival Skills Education) will improve Outcome: Progressing   Problem: Coping: Goal: Ability to adjust to condition or change in health will improve Outcome: Progressing   Problem: Fluid Volume: Goal: Ability to maintain a balanced intake and output will improve Outcome: Progressing   Problem: Nutritional: Goal: Maintenance of adequate nutrition will improve Outcome: Progressing   Problem: Skin Integrity: Goal: Risk for impaired skin integrity will decrease Outcome: Progressing   Problem: Health Behavior/Discharge Planning: Goal: Ability to manage health-related needs will improve Outcome: Progressing

## 2023-04-09 NOTE — Op Note (Signed)
 Patient Name: Linda [name] DOB: 25-Jun-1970  MRN: [id number]   Date of Service: 04/07/2023 - 04/09/2023  Surgeon: Dr. Bronwen Betters, DPM Assistants: None Pre-operative Diagnosis:  Left foot Osteomyelitis Post-operative Diagnosis:  Left foot Osteomyelitis Procedures: Procedure:    LEFT TRANSMETATARSAL AMPUTATION CPT(R) Code:  57846 - PR AMPUTATION FOOT TRANSMETARSAL  Procedure:    LENGTHENING, TENDON, ACHILLES  Pathology/Specimens: ID Type Source Tests Collected by Time Destination  1 : LEFT TRANSMETATARSAL AMPUTATION FOR PATHOLOGY Tissue PATH Amputaion Arm/Leg SURGICAL PATHOLOGY Barbaraann Share, DPM 04/09/2023 0825   A : 4th MPJ for Wound Culture Tissue PATH Amputaion Arm/Leg AEROBIC/ANAEROBIC CULTURE W GRAM STAIN (SURGICAL/DEEP WOUND) Barbaraann Share, DPM 04/09/2023 0827    Anesthesia: General Hemostasis:  Total Tourniquet Time Documented: Calf (Left) - 31 minutes Total: Calf (Left) - 31 minutes  Estimated Blood Loss: 50 mL Materials: 3-0 Vicryl, 3-0 Prolene, skin staples Medications: 20 mL of 0.5% Marcaine Plain, 20 mL of 2% Lidocaine plain Complications: non  Indications for Procedure:  This is a 53 y.o. female with a past medical history of hypertension, hyperlipidemia, type 2 diabetes mellitus, neuropathy, previous multiple toe amputations, liver cirrhosis, thrombocytopenia, history of breast cancer who was admitted for left foot infection.  She is well-known to me and has been dealing with worsening wound to left fourth toe which began infected and cause the patient to come to the hospital.  Due to previous amputations of first, second, third toes, decision was made to proceed with transmetatarsal amputation with achilles lengthening as this would be more stable for her and less likely to have recurrent ulcerations. Risks, benefits and alternative therapies were discussed with the patient at length.  Patient was agreeable to surgery and written consent was obtained.   Procedure  in Detail: Patient was identified in pre-operative holding area. Formal consent was signed and the left lower extremity was marked. Patient was brought back to the operating room. Anesthesia was induced.  Preoperative ankle block was administered to the left lower extremity injecting a one-to-one ratio of 0.5% Marcaine plain and 2% lidocaine plain for a total of 40 cc administered.  The extremity was prepped and draped in the usual sterile fashion. Timeout was taken to confirm patient name, laterality, and procedure prior to incision.   Esmarch bandage was used to exsanguinate the left lower extremity and then was used as a tourniquet around the left ankle.  Attention was then directed to the left foot where a modified fishmouth incision was made using a 10 blade over the level of the metatarsophalangeal joints.  Incision made full-thickness straight down to the level of bone.  Full-thickness periosteal and soft tissue flaps were elevated off of the dorsal metatarsals.  The metatarsals were osteotomized using a sagittal saw taking care to maintain metatarsal parabola and beveled appropriately. The metatarsals and forefoot was then resected and passed of the surgical field to be sent for specimen. Tissue cultures were obtained from the plantar fourth toe wound site and soft tissue and bone around the fourth metatarsal phalangeal joint.  Proximal margin of the fourth resected metatarsal was marked with ink.  Surgical site at this point was copiously irrigated with normal sterile saline, no signs of infection were appreciated this point.  At this point the Esmarch tourniquet was removed.  Patient's tissues bled well.  Approximation of deep tissues was performed using 3-0 Vicryl.  Skin incision was then closed using a combination of 3-0 Prolene and staples under minimal tension.  Attention was then  directed to the posterior Achilles as the patient was noted to have equinus contracture reducing the amount of ankle  joint dorsiflexion preoperatively.  3 percutaneous stab incisions were made using a 15 blade to perform hemisection tenotomy of the Achilles tendon.  Improved ankle joint dorsiflexion was noted.  The stab incision sites were then closed using skin staples.  The foot was then dressed with Xeroform, 4 x 4 gauze, ABD, Kerlix, Ace wrap.  A well-padded posterior splint was applied to the left lower extremity.  Patient tolerated the procedure well. She was transferred from the OR to PACU hemodynamically stable.   Disposition: Following a period of post-operative monitoring, patient will be transferred back to their room on the floors. Strict nonweightbearing left lower extremity. Anticipate clean surgical margins.

## 2023-04-09 NOTE — Anesthesia Procedure Notes (Signed)
 Procedure Name: LMA Insertion Date/Time: 04/09/2023 7:44 AM  Performed by: Herbie Drape, CRNAPre-anesthesia Checklist: Patient identified, Emergency Drugs available, Suction available, Patient being monitored and Timeout performed Patient Re-evaluated:Patient Re-evaluated prior to induction Oxygen Delivery Method: Circle system utilized Preoxygenation: Pre-oxygenation with 100% oxygen Induction Type: IV induction Ventilation: Mask ventilation without difficulty LMA: LMA inserted LMA Size: 4.0 Number of attempts: 1 Dental Injury: Teeth and Oropharynx as per pre-operative assessment  Comments: LMA #4 placed with ease  Without leak

## 2023-04-09 NOTE — Anesthesia Preprocedure Evaluation (Signed)
 Anesthesia Evaluation  Patient identified by MRN, date of birth, ID band Patient awake    Reviewed: Allergy & Precautions, NPO status , Patient's Chart, lab work & pertinent test results  Airway Mallampati: II  TM Distance: >3 FB Neck ROM: Full    Dental  (+) Dental Advisory Given   Pulmonary sleep apnea    breath sounds clear to auscultation       Cardiovascular hypertension, Pt. on medications and Pt. on home beta blockers  Rhythm:Regular Rate:Normal     Neuro/Psych  Neuromuscular disease    GI/Hepatic ,GERD  ,,(+) Hepatitis -  Endo/Other  diabetes, Type 2, Insulin DependentHypothyroidism    Renal/GU Renal disease     Musculoskeletal  (+) Arthritis ,    Abdominal   Peds  Hematology  (+) Blood dyscrasia, anemia   Anesthesia Other Findings   Reproductive/Obstetrics                             Anesthesia Physical Anesthesia Plan  ASA: 3  Anesthesia Plan: General   Post-op Pain Management: Regional block*, Ofirmev IV (intra-op)* and Toradol IV (intra-op)*   Induction: Intravenous  PONV Risk Score and Plan: 3 and Dexamethasone, Ondansetron, Midazolam and Treatment may vary due to age or medical condition  Airway Management Planned: LMA  Additional Equipment:   Intra-op Plan:   Post-operative Plan: Extubation in OR  Informed Consent: I have reviewed the patients History and Physical, chart, labs and discussed the procedure including the risks, benefits and alternatives for the proposed anesthesia with the patient or authorized representative who has indicated his/her understanding and acceptance.     Dental advisory given  Plan Discussed with: CRNA  Anesthesia Plan Comments:        Anesthesia Quick Evaluation

## 2023-04-09 NOTE — Anesthesia Postprocedure Evaluation (Signed)
 Anesthesia Post Note  Patient: Linda [name]  Procedure(s) Performed: LEFT TRANSMETATARSAL AMPUTATION (Left: Toe) LENGTHENING, TENDON, ACHILLES (Left: Ankle)     Patient location during evaluation: PACU Anesthesia Type: General Level of consciousness: awake and alert Pain management: pain level controlled Vital Signs Assessment: post-procedure vital signs reviewed and stable Respiratory status: spontaneous breathing, nonlabored ventilation, respiratory function stable and patient connected to nasal cannula oxygen Cardiovascular status: blood pressure returned to baseline and stable Postop Assessment: no apparent nausea or vomiting Anesthetic complications: no  No notable events documented.  Last Vitals:  Vitals:   04/09/23 0945 04/09/23 1018  BP: 126/80 124/73  Pulse: 99 93  Resp: 13 17  Temp: 36.7 C 36.7 C  SpO2: 97% 99%    Last Pain:  Vitals:   04/09/23 1018  TempSrc: Oral  PainSc: 3                  Kennieth Rad

## 2023-04-09 NOTE — Transfer of Care (Signed)
 Immediate Anesthesia Transfer of Care Note  Patient: Linda [name]  Procedure(s) Performed: LEFT TRANSMETATARSAL AMPUTATION (Left: Toe) LENGTHENING, TENDON, ACHILLES (Left: Ankle)  Patient Location: PACU  Anesthesia Type:General  Level of Consciousness: awake  Airway & Oxygen Therapy: Patient Spontanous Breathing  Post-op Assessment: Report given to RN  Post vital signs: Reviewed  Last Vitals:  Vitals Value Taken Time  BP 127/80 04/09/23 0916  Temp 36.9 C 04/09/23 0916  Pulse 99 04/09/23 0920  Resp 15 04/09/23 0920  SpO2 100 % 04/09/23 0920  Vitals shown include unfiled device data.  Last Pain:  Vitals:   04/09/23 0655  TempSrc: Oral  PainSc: 4       Patients Stated Pain Goal: 0 (04/08/23 0837)  Complications: No notable events documented.

## 2023-04-10 ENCOUNTER — Ambulatory Visit: Admitting: Podiatry

## 2023-04-10 ENCOUNTER — Encounter (HOSPITAL_COMMUNITY): Payer: Self-pay | Admitting: Podiatry

## 2023-04-10 ENCOUNTER — Inpatient Hospital Stay (HOSPITAL_COMMUNITY)

## 2023-04-10 ENCOUNTER — Other Ambulatory Visit: Payer: Self-pay | Admitting: Podiatry

## 2023-04-10 DIAGNOSIS — E118 Type 2 diabetes mellitus with unspecified complications: Secondary | ICD-10-CM | POA: Diagnosis not present

## 2023-04-10 DIAGNOSIS — L97509 Non-pressure chronic ulcer of other part of unspecified foot with unspecified severity: Secondary | ICD-10-CM | POA: Diagnosis not present

## 2023-04-10 LAB — GLUCOSE, CAPILLARY
Glucose-Capillary: 154 mg/dL — ABNORMAL HIGH (ref 70–99)
Glucose-Capillary: 167 mg/dL — ABNORMAL HIGH (ref 70–99)
Glucose-Capillary: 160 mg/dL — ABNORMAL HIGH (ref 70–99)
Glucose-Capillary: 173 mg/dL — ABNORMAL HIGH (ref 70–99)

## 2023-04-10 LAB — BASIC METABOLIC PANEL
Anion gap: 9 (ref 5–15)
BUN: 6 mg/dL (ref 6–20)
CO2: 23 mmol/L (ref 22–32)
Calcium: 8.4 mg/dL — ABNORMAL LOW (ref 8.9–10.3)
Chloride: 107 mmol/L (ref 98–111)
Creatinine, Ser: 0.91 mg/dL (ref 0.44–1.00)
GFR, Estimated: >60 mL/min (ref >60)
Glucose, Bld: 149 mg/dL — ABNORMAL HIGH (ref 70–99)
Potassium: 3.3 mmol/L — ABNORMAL LOW (ref 3.5–5.1)
Sodium: 139 mmol/L (ref 135–145)

## 2023-04-10 LAB — VAS US ABI WITH/WO TBI: Right ABI: 1.15

## 2023-04-10 MED ORDER — CHLORHEXIDINE GLUCONATE CLOTH 2 % EX PADS
6.0000 | MEDICATED_PAD | Freq: Every day | CUTANEOUS | Status: DC
  Administered 2023-04-10 – 2023-04-12 (×4): 6 via TOPICAL

## 2023-04-10 MED ORDER — POTASSIUM CHLORIDE CRYS ER 20 MEQ PO TBCR
20.0000 meq | EXTENDED_RELEASE_TABLET | Freq: Two times a day (BID) | ORAL | Status: AC
  Administered 2023-04-10 – 2023-04-11 (×4): 20 meq via ORAL
  Filled 2023-04-10 (×4): qty 1

## 2023-04-10 NOTE — Progress Notes (Signed)
 Inpatient Rehab Admissions Coordinator:   Per OT recommendations, patient was screened for CIR candidacy by Megan Salon, MS, CCC-SLP. At this time, Pt. does not appear to demonstrate medical necessity to justify in hospital rehabilitation/CIR. I also do not think insurance will approve for  CIR. I will not pursue a rehab consult for this Pt.   Recommend other rehab venues to be pursued.  Please contact me with any questions.  Megan Salon, MS, CCC-SLP Rehab Admissions Coordinator  [phone] (celll) [phone] (office)

## 2023-04-10 NOTE — Evaluation (Signed)
 Physical Therapy Evaluation Patient Details Name: Linda [name] MRN: [id number] DOB: May 07, 1970 Today's Date: 04/10/2023  History of Present Illness  Pt is a 53 year old femal admitted with ostemyelitis and non-healing left 4th toe wound.  Underwent fourth toe amputaion on 3/16 and achilles tendon lengthening.   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  Clinical Impression   Pt admitted with above diagnosis. Lives at home alone, in a single-level home with level entry steps to enter; Prior to admission, pt was able to manage independently with rollator RW; Presents to PT with functional dependencies, weight bearing restrictions, generalized weakness, incr fall risk; showing a funcitonal decline comapred to her baseline;  per OT note, managing bed mobility modified independently; needs assist  with sit to stand and step pivot transfers; With incr effort and multimodal cues, pt able to take 3 steps with RW and maintaining LLE NWB; shows good rehab potential, with good participation; anticipate she will be able to  reach modified independence, esp at wheelchair transfer level; REcommend post-acute rehab to maximize independence and safety with mobility and ADLs; Pt currently with functional limitations due to the deficits listed below (see PT Problem List). Pt will benefit from skilled PT to increase their independence and safety with mobility to allow discharge to the venue listed below.           If plan is discharge home, recommend the following: A lot of help with walking and/or transfers;A lot of help with bathing/dressing/bathroom   Can travel by private vehicle   No    Equipment Recommendations Rolling walker (2 wheels);BSC/3in1;Wheelchair (measurements PT);Wheelchair cushion (measurements PT)  Recommendations for Other Services       Functional Status  Assessment Patient has had a recent decline in their functional status and demonstrates the ability to make significant improvements in function in a reasonable and predictable amount of time.     Precautions / Restrictions Restrictions Weight Bearing Restrictions Per Provider Order: Yes LLE Weight Bearing Per Provider Order: Non weight bearing      Mobility  Bed Mobility                    Transfers Overall transfer level: Needs assistance Equipment used: Rolling walker (2 wheels) Transfers: Sit to/from Stand Sit to Stand: Mod assist, Min assist                Ambulation/Gait Ambulation/Gait assistance: +2 safety/equipment, Min assist Gait Distance (Feet): 3 Feet Assistive device: Rolling walker (2 wheels) Gait Pattern/deviations: Decreased step length - right, Trunk flexed Gait velocity: decreased Gait velocity interpretation: <1.31 ft/sec, indicative of household ambulator   General Gait Details: Difficulty with "hop-steps" secondary to bil UE weakness; adjusted height of RW for better fit, and pt able to accept full weight onto RW for R foot advancement  Stairs            Wheelchair Mobility     Tilt Bed    Modified Rankin (Stroke Patients Only)       Balance     Sitting balance-Leahy Scale: Fair     Standing balance support: Bilateral upper extremity supported, Reliant on assistive device for balance Standing balance-Leahy Scale: Poor Standing balance comment: Needs BUE support to maintain NWBing on the LLE.  Pertinent Vitals/Pain Pain Assessment Pain Assessment: 0-10 Pain Score: 5  Pain Location: LLE Pain Descriptors / Indicators: Discomfort Pain Intervention(s): Monitored during session    Home Living Family/patient expects to be discharged to:: Private residence Living Arrangements: Alone Available Help at Discharge:  (No physical help but step mother helps bring food every once in a  while) Type of Home: Apartment Home Access: Level entry       Home Layout: One level Home Equipment: Grab bars - tub/shower;Grab bars - toilet;Rollator (4 wheels);Shower seat (Pt reports small bathroom space; has grab bars and shower chair) Additional Comments: She was helping one of her neighbors with care prior to discharge.    Prior Function Prior Level of Function : Independent/Modified Independent             Mobility Comments: Used rollator occasionally but most of the time didn't need anything for mobility inside.  She would use the rollator more for outside walking around the outside of her apartment. ADLs Comments: has a shower seat; reports tub height is     Extremity/Trunk Assessment   Upper Extremity Assessment Upper Extremity Assessment: Defer to OT evaluation    Lower Extremity Assessment Lower Extremity Assessment: Generalized weakness;RLE deficits/detail;LLE deficits/detail RLE Deficits / Details: Previous surgery R foot; pt wears a postop shoe; generally weak LLE Deficits / Details: foot and ankle immobilized in postop splint; incision dressed; Able to move hip and knee as needed for sitting adn standing; Able to hold LLE up for NWB    Cervical / Trunk Assessment Cervical / Trunk Assessment: Normal  Communication   Communication Communication: No apparent difficulties    Cognition Arousal: Alert Behavior During Therapy: WFL for tasks assessed/performed   PT - Cognitive impairments: No apparent impairments                         Following commands: Intact       Cueing Cueing Techniques: Verbal cues     General Comments General comments (skin integrity, edema, etc.): Discussed phantom sensation and desensitization techniques    Exercises     Assessment/Plan    PT Assessment Patient needs continued PT services  PT Problem List Decreased strength;Decreased activity tolerance;Decreased balance;Decreased mobility;Decreased knowledge  of use of DME;Decreased knowledge of precautions;Pain;Impaired sensation       PT Treatment Interventions DME instruction;Gait training;Functional mobility training;Therapeutic activities;Therapeutic exercise;Balance training;Patient/family education;Wheelchair mobility training    PT Goals (Current goals can be found in the Care Plan section)  Acute Rehab PT Goals Patient Stated Goal: To be able to manage safely at home PT Goal Formulation: With patient Time For Goal Achievement: 04/24/23 Potential to Achieve Goals: Good Additional Goals Additional Goal #1: Pt will propel wheelchair 1000 ft and manage parts independently    Frequency Min 2X/week     Co-evaluation               AM-PAC PT "6 Clicks" Mobility  Outcome Measure Help needed turning from your back to your side while in a flat bed without using bedrails?: A Little Help needed moving from lying on your back to sitting on the side of a flat bed without using bedrails?: None Help needed moving to and from a bed to a chair (including a wheelchair)?: A Lot Help needed standing up from a chair using your arms (e.g., wheelchair or bedside chair)?: A Lot Help needed to walk in hospital room?: Total Help needed climbing 3-5 steps with a railing? :  Total 6 Click Score: 13    End of Session Equipment Utilized During Treatment: Gait belt Activity Tolerance: Patient limited by fatigue Patient left: in chair   PT Visit Diagnosis: Unsteadiness on feet (R26.81);Muscle weakness (generalized) (M62.81);Pain Pain - Right/Left: Left Pain - part of body: Ankle and joints of foot    Time: 1226-1251 PT Time Calculation (min) (ACUTE ONLY): 25 min   Charges:   PT Evaluation $PT Eval Moderate Complexity: 1 Mod PT Treatments $Therapeutic Activity: 8-22 mins PT General Charges $$ ACUTE PT VISIT: 1 Visit         Van Clines, PT  Acute Rehabilitation Services Office [phone] Secure Chat welcomed   Levi Aland 04/10/2023, 2:42 PM

## 2023-04-10 NOTE — Progress Notes (Signed)
 PROGRESS NOTE    Yasemin Rabon  ZOX:[id number] DOB: [birth year] DOA: 04/07/2023 PCP: [name], MD    Brief Narrative:  53 year old with history of hypertension, hyperlipidemia, type 2 diabetes on insulin, neuropathy, previous multiple toe amputations due to diabetic foot ulcer sent from podiatry office for nonhealing left third toe wound.  Admitted.  Found to have osteomyelitis.  On antibiotics. For  surgical amputation.  Subjective:  Patient seen and examined.  No overnight events.  Pain is controlled.  She may need some help at home.  Assessment & Plan:   Diabetic foot infection, failed outpatient therapy.  MRI consistent with osteomyelitis of the third toe. Status post left-sided TMA 3/16.  Surgical cultures pending.  Blood cultures negative so far. Nonweightbearing.  Incision intact. Surgery reported clean surgical margins.  Will continue vancomycin today.  Discontinue Flagyl and cefepime. Adequate pain medications.  Start therapies today.  Type 2 diabetes: Well-controlled as per patient.  Recent A1c 6.7.  On high-dose insulin that is continued.  Chronic medical issues including Hypertension, stable Hyperlipidemia, on statin Neuropathy, on gabapentin Chronic anemia and thrombocytopenia secondary to cirrhosis, stable. Platelets are 64, white count is 3.  Historically at baseline. Chronic pain syndrome, on gabapentin and morphine. Depression, on amitriptyline, Wellbutrin, BuSpar, Vraylar and Fetzima.  Continue.  DVT prophylaxis: SCDs Start: 04/07/23 1847   Code Status: Full code Family Communication: None at bedside Disposition Plan: Status is: Inpatient Remains inpatient appropriate because: Immediate postop     Consultants:  Podiatry  Procedures:  None  Antimicrobials:  Flagyl and cefepime 3/14--3/17 Vancomycin 3/14---     Objective: Vitals:   04/09/23 2207 04/10/23 0008 04/10/23 0328 04/10/23 0754  BP:  (!) 115/56 110/61 115/71  Pulse: 99  (!) 102 (!) 108 (!) 102  Resp:  18 17 17   Temp: 100.2 F (37.9 C) 99.4 F (37.4 C) 99 F (37.2 C) 99.6 F (37.6 C)  TempSrc: Oral Oral Oral Oral  SpO2: 95% 96% 94% 100%  Weight:      Height:        Intake/Output Summary (Last 24 hours) at 04/10/2023 1024 Last data filed at 04/10/2023 0900 Gross per 24 hour  Intake 1431.65 ml  Output --  Net 1431.65 ml   Filed Weights   04/08/23 1057  Weight: 93.9 kg    Examination:  General exam: Appears calm and comfortable .  Pleasant interactive. Respiratory system: Clear to auscultation. Respiratory effort normal. Cardiovascular system: S1 & S2 heard,  Gastrointestinal system: Soft nontender. Central nervous system: Alert and oriented. No focal neurological deficits. Extremities: Symmetric 5 x 5 power. Skin:  Right foot with multiple lost toes, clean and healthy.  Nontender. Left foot immediate postop.  Back splint present.    Data Reviewed: I have personally reviewed following labs and imaging studies  CBC: Recent Labs  Lab 04/07/23 1652 04/08/23 0751  WBC 6.8 3.0*  NEUTROABS 4.2  --   HGB 12.8 10.4*  HCT 41.5 33.6*  MCV 83.7 83.8  PLT 99* 64*   Basic Metabolic Panel: Recent Labs  Lab 04/07/23 1652 04/08/23 0751 04/10/23 0450  NA 140 139 139  K 4.1 3.7 3.3*  CL 108 108 107  CO2 22 23 23   GLUCOSE 104* 180* 149*  BUN 9 8 6   CREATININE 1.42* 0.96 0.91  CALCIUM 8.8* 7.9* 8.4*   GFR: Estimated Creatinine Clearance: 75.4 mL/min (by C-G formula based on SCr of 0.91 mg/dL). Liver Function Tests: Recent Labs  Lab 04/07/23 2129 04/08/23 0751  AST 20 17  ALT 13 12  ALKPHOS 73 62  BILITOT 0.6 0.5  PROT 6.2* 5.3*  ALBUMIN 3.2* 2.6*   No results for input(s): "LIPASE", "AMYLASE" in the last 168 hours. No results for input(s): "AMMONIA" in the last 168 hours. Coagulation Profile: No results for input(s): "INR", "PROTIME" in the last 168 hours. Cardiac Enzymes: No results for input(s): "CKTOTAL", "CKMB",  "CKMBINDEX", "TROPONINI" in the last 168 hours. BNP (last 3 results) No results for input(s): "PROBNP" in the last 8760 hours. HbA1C: No results for input(s): "HGBA1C" in the last 72 hours. CBG: Recent Labs  Lab 04/09/23 0919 04/09/23 1151 04/09/23 1620 04/09/23 2138 04/10/23 0752  GLUCAP 132* 143* 263* 170* 154*   Lipid Profile: No results for input(s): "CHOL", "HDL", "LDLCALC", "TRIG", "CHOLHDL", "LDLDIRECT" in the last 72 hours. Thyroid Function Tests: No results for input(s): "TSH", "T4TOTAL", "FREET4", "T3FREE", "THYROIDAB" in the last 72 hours. Anemia Panel: No results for input(s): "VITAMINB12", "FOLATE", "FERRITIN", "TIBC", "IRON", "RETICCTPCT" in the last 72 hours. Sepsis Labs: No results for input(s): "PROCALCITON", "LATICACIDVEN" in the last 168 hours.  Recent Results (from the past 240 hours)  Surgical pcr screen     Status: None   Collection Time: 04/09/23  6:12 AM   Specimen: Nasal Mucosa; Nasal Swab  Result Value Ref Range Status   MRSA, PCR NEGATIVE NEGATIVE Final   Staphylococcus aureus NEGATIVE NEGATIVE Final    Comment: (NOTE) The Xpert SA Assay (FDA approved for NASAL specimens in patients 18 years of age and older), is one component of a comprehensive surveillance program. It is not intended to diagnose infection nor to guide or monitor treatment. Performed at [facility], [address] [place], [place] [address]   Aerobic/Anaerobic Culture w Gram Stain (surgical/deep wound)     Status: None (Preliminary result)   Collection Time: 04/09/23  8:27 AM   Specimen: PATH Amputaion Arm/Leg; Tissue  Result Value Ref Range Status   Specimen Description TISSUE  Final   Special Requests 4th MPJ  Final   Gram Stain RARE WBC SEEN NO ORGANISMS SEEN   Final   Culture   Final    CULTURE REINCUBATED FOR BETTER GROWTH Performed at [facility], [address] [place], [place] [address]    Report Status PENDING  Incomplete         Radiology  Studies: DG Foot Complete Left Result Date: 04/09/2023 CLINICAL DATA:  Status post transmetatarsal amputation of left foot. EXAM: LEFT FOOT - COMPLETE 3+ VIEW COMPARISON:  Left foot radiographs 04/03/2023, MRI left forefoot 04/07/2023 FINDINGS: Interval first through fifth ray amputation to the proximal metatarsal shaft. Amputation margins are sharp. There are distal soft tissues surgical skin staples. Mild plantar and posterior calcaneal heel spurs. No subcutaneous air. No acute fracture or dislocation. IMPRESSION: Interval first through fifth ray amputation to the proximal metatarsal shaft. Electronically Signed   By: Neita Garnet M.D.   On: 04/09/2023 10:07        Scheduled Meds:  amitriptyline  25 mg Oral QHS   atorvastatin  10 mg Oral QPM   buPROPion  300 mg Oral q morning   busPIRone  5 mg Oral TID   cariprazine  3 mg Oral Daily   gabapentin  600 mg Oral TID   insulin aspart  0-15 Units Subcutaneous TID WC   insulin aspart  0-5 Units Subcutaneous QHS   insulin glargine  50 Units Subcutaneous QHS   Levomilnacipran HCl ER  1  capsule Oral QPM   levothyroxine  75 mcg Oral Q0600   lidocaine  3 patch Transdermal Q24H   lisdexamfetamine  70 mg Oral QHS   losartan  50 mg Oral QPM   metoprolol succinate  12.5 mg Oral Daily   morphine  30 mg Oral Q12H   mupirocin ointment  1 Application Nasal BID   pantoprazole  40 mg Oral BID   potassium chloride  20 mEq Oral BID   ramelteon  8 mg Oral QHS   rOPINIRole  0.25 mg Oral QHS   sodium chloride flush  3 mL Intravenous Q12H   Continuous Infusions:  vancomycin Stopped (04/09/23 1919)     LOS: 3 days    Time spent: 40 minutes    Dorcas Carrow, MD [facility]

## 2023-04-10 NOTE — Evaluation (Signed)
 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  Underwent fourth toe amputaion on 3/16 and achilles tendon lengthening.   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     Clinical Impressions Pt currently min to mod assist for selfcare sit to stand and for functional transfers with use of the RW for support and maintaining NWBing status over the LLE.  Pt lived alone prior to admission and occasionally used her rollator for longer distances outside of her apartment, but inside she did not use an assistive device.  Feel she will benefit from acute care OT at this time in order to progress ADL independence for eventual return home alone.  Recommend  intensive inpatient follow-up therapy, >3 hours/day to reach modified independent level with use of appropriate DME for mobility, toileting, and bathing.          If plan is discharge home, recommend the following:   Assistance with cooking/housework;Assist for transportation;Help with stairs or ramp for entrance;A little help with bathing/dressing/bathroom     Functional Status Assessment   Patient has had a recent decline in their functional status and demonstrates the ability to make significant improvements in function in a reasonable and predictable amount of time.     Equipment Recommendations   Tub/shower bench;BSC/3in1     Recommendations for Other Services   Rehab consult     Precautions/Restrictions   Precautions Precautions: Fall Recall of Precautions/Restrictions: Intact Restrictions Weight Bearing Restrictions Per Provider Order: Yes LLE Weight Bearing Per Provider Order: Non weight  bearing     Mobility Bed Mobility Overal bed mobility: Modified Independent                  Transfers Overall transfer level: Needs assistance Equipment used: Rolling walker (2 wheels) Transfers: Sit to/from Stand, Bed to chair/wheelchair/BSC Sit to Stand: Min assist     Step pivot transfers: Min assist     General transfer comment: Min instructional cueing for hand placement and technique for sit to stand and stand to sit transition.      Balance Overall balance assessment: Needs assistance Sitting-balance support: Feet supported Sitting balance-Leahy Scale: Fair     Standing balance support: Bilateral upper extremity supported, Reliant on assistive device for balance Standing balance-Leahy Scale: Poor Standing balance comment: Needs BUE support to maintain NWBing on the LLE.                           ADL either performed or assessed with clinical judgement   ADL Overall ADL's : Needs assistance/impaired Eating/Feeding: Independent;Sitting   Grooming: Set up;Sitting   Upper Body Bathing: Supervision/ safety;Sitting   Lower Body Bathing: Sit to/from stand;Minimal assistance   Upper Body Dressing : Supervision/safety;Sitting   Lower Body Dressing: Sit to/from stand;Moderate assistance Lower Body Dressing Details (indicate cue type and reason): simulated Toilet Transfer: Minimal assistance;Stand-pivot;BSC/3in1 Toilet Transfer Details (indicate cue type and reason): simulated Toileting- Clothing Manipulation and Hygiene: Moderate assistance;Sit to/from stand Toileting - Clothing Manipulation Details (indicate cue type and reason): simulated     Functional mobility during ADLs: Minimal assistance (step pivot with RW to the recliner) General ADL Comments: Pt currently needing min instructional cueing for  weightbearing restrictions and technique for sit to stand from the EOB.  Decreased overall endurance for maintaining more than a few hops to the  recliner.  Vitals stable throughout without reports of dizziness.  Pt reports not having any assistance post discharge and will need to be modified independent.     Vision Baseline Vision/History: 1 Wears glasses Ability to See in Adequate Light: 0 Adequate Patient Visual Report: No change from baseline Vision Assessment?: No apparent visual deficits     Perception Perception: Not tested       Praxis Praxis: Not tested       Pertinent Vitals/Pain Pain Assessment Pain Assessment: 0-10 Pain Score: 5  Pain Location: LLE Pain Descriptors / Indicators: Discomfort Pain Intervention(s): Limited activity within patient's tolerance, Repositioned     Extremity/Trunk Assessment Upper Extremity Assessment Upper Extremity Assessment: Overall WFL for tasks assessed   Lower Extremity Assessment Lower Extremity Assessment: Defer to PT evaluation   Cervical / Trunk Assessment Cervical / Trunk Assessment: Normal   Communication Communication Communication: No apparent difficulties   Cognition Arousal: Alert Behavior During Therapy: WFL for tasks assessed/performed                                 Following commands: Intact       Cueing  General Comments   Cueing Techniques: Verbal cues              Home Living Family/patient expects to be discharged to:: Private residence Living Arrangements: Alone Available Help at Discharge:  (No physical help but step mother helps bring food every once in a while) Type of Home: Apartment Home Access: Level entry     Home Layout: One level     Bathroom Shower/Tub: Tub/shower unit (pt has hard time getting in/out of shower)   Bathroom Toilet: Standard Bathroom Accessibility: Yes   Home Equipment: Grab bars - tub/shower;Grab bars - toilet;Rollator (4 wheels);Shower seat (Pt reports small bathroom space; has grab bars and shower chair)   Additional Comments: She was helping one of her neighbors with care prior to  discharge.      Prior Functioning/Environment Prior Level of Function : Independent/Modified Independent             Mobility Comments: Used rollator occasionally but most of the time didn't need anything for mobility inside.  She would use the rollator more for outside walking around the outside of her apartment.      OT Problem List: Decreased knowledge of use of DME or AE;Decreased activity tolerance;Impaired balance (sitting and/or standing);Pain;Decreased strength   OT Treatment/Interventions: Self-care/ADL training;Patient/family education;Balance training;DME and/or AE instruction;Therapeutic activities;Neuromuscular education;Therapeutic exercise;Energy conservation      OT Goals(Current goals can be found in the care plan section)   Acute Rehab OT Goals Patient Stated Goal: Pt wants to get back to beingt independent OT Goal Formulation: With patient Time For Goal Achievement: 04/24/23 Potential to Achieve Goals: Good   OT Frequency:  Min 2X/week       AM-PAC OT "6 Clicks" Daily Activity     Outcome Measure Help from another person eating meals?: None Help from another person taking care of personal grooming?: A Little Help from another person toileting, which includes using toliet, bedpan, or urinal?: A Little Help from another person bathing (including washing, rinsing, drying)?: A Little Help from another person to put on and taking off regular upper body clothing?: A Little Help from  another person to put on and taking off regular lower body clothing?: A Lot 6 Click Score: 18   End of Session Equipment Utilized During Treatment: Gait belt;Rolling walker (2 wheels) Nurse Communication: Mobility status  Activity Tolerance: Patient tolerated treatment well Patient left: in bed;with chair alarm set  OT Visit Diagnosis: Unsteadiness on feet (R26.81);Other abnormalities of gait and mobility (R26.89);Muscle weakness (generalized) (M62.81);Pain Pain - Right/Left:  Left Pain - part of body: Leg                Time: 1111-1147 OT Time Calculation (min): 36 min Charges:  OT General Charges $OT Visit: 1 Visit OT Evaluation $OT Eval Moderate Complexity: 1 Mod OT Treatments $Self Care/Home Management : 8-22 mins  Perrin Maltese, OTR/L Acute Rehabilitation Services  Office [phone] 04/10/2023

## 2023-04-10 NOTE — Plan of Care (Signed)
  Problem: Coping: Goal: Ability to adjust to condition or change in health will improve Outcome: Progressing   Problem: Clinical Measurements: Goal: Ability to maintain clinical measurements within normal limits will improve Outcome: Progressing   Problem: Activity: Goal: Risk for activity intolerance will decrease Outcome: Progressing   Problem: Nutrition: Goal: Adequate nutrition will be maintained Outcome: Progressing   Problem: Coping: Goal: Level of anxiety will decrease Outcome: Progressing   Problem: Pain Managment: Goal: General experience of comfort will improve and/or be controlled Outcome: Progressing   Problem: Safety: Goal: Ability to remain free from injury will improve Outcome: Progressing   Problem: Skin Integrity: Goal: Risk for impaired skin integrity will decrease Outcome: Progressing

## 2023-04-10 NOTE — Progress Notes (Signed)
 Ankle-brachial index completed. Please see CV Procedures for preliminary results.  Shona Simpson, RVT 04/10/23 5:06 PM

## 2023-04-10 NOTE — Progress Notes (Signed)
  Subjective:  Patient ID: Linda [name], female    DOB: Oct 06, 1970,  MRN: [id number]   DOS: 04/09/23 Procedure: Left foot trans metatarsal amputaiton, tendo achilles lengthening  53 y.o. female seen for post op check. She reports she Is doing well, has worked with PT today, pain is controlled currently. Denies concerns.  Review of Systems: Negative except as noted in the HPI. Denies N/V/F/Ch.   Objective:   Vitals:   04/10/23 0328 04/10/23 0754  BP: 110/61 115/71  Pulse: (!) 108 (!) 102  Resp: 17 17  Temp: 99 F (37.2 C) 99.6 F (37.6 C)  SpO2: 94% 100%   Body mass index is 37.86 kg/m. Constitutional Well developed. Well nourished.  Vascular Foot warm and well perfused. Capillary refill normal to all digits.   No calf pain with palpation  Neurologic Normal speech. Oriented to person, place, and time. Epicritic sensation diminished   Dermatologic Dressing C/D/I  Orthopedic: S/p L TMA, TAL   Radiographs: Interval first through fifth ray amputation to the proximal metatarsal shaft.  Pathology: pending  Micro: Pending  Assessment:   L foot osteomyelitis s/p  TMA, TAL  Plan:  Patient was evaluated and treated and all questions answered.  POD # 1 s/p TMA and TAL on left side -Progressing as expected - Will change dressing tomorrow -XR: expected post op changes -WB Status: strict NWB in posterior splint, will change to CAM boot tmrw -Sutures: remain intact. -Medications/ABX: continue IV abx pending further culture data, plan for 7 days Po abx on discharge -will follow        Corinna Gab, DPM [facility] / [facility]

## 2023-04-11 DIAGNOSIS — E118 Type 2 diabetes mellitus with unspecified complications: Secondary | ICD-10-CM | POA: Diagnosis not present

## 2023-04-11 LAB — GLUCOSE, CAPILLARY
Glucose-Capillary: 256 mg/dL — ABNORMAL HIGH (ref 70–99)
Glucose-Capillary: 211 mg/dL — ABNORMAL HIGH (ref 70–99)
Glucose-Capillary: 200 mg/dL — ABNORMAL HIGH (ref 70–99)
Glucose-Capillary: 244 mg/dL — ABNORMAL HIGH (ref 70–99)

## 2023-04-11 MED ORDER — AMOXICILLIN-POT CLAVULANATE 875-125 MG PO TABS
1.0000 | ORAL_TABLET | Freq: Two times a day (BID) | ORAL | Status: DC
  Administered 2023-04-11 – 2023-04-12 (×2): 1 via ORAL
  Filled 2023-04-11 (×2): qty 1

## 2023-04-11 MED ORDER — INSULIN GLARGINE 100 UNIT/ML ~~LOC~~ SOLN
60.0000 [IU] | Freq: Every day | SUBCUTANEOUS | Status: DC
  Administered 2023-04-11: 60 [IU] via SUBCUTANEOUS
  Filled 2023-04-11 (×2): qty 0.6

## 2023-04-11 MED ORDER — PNEUMOCOCCAL 20-VAL CONJ VACC 0.5 ML IM SUSY
0.5000 mL | PREFILLED_SYRINGE | INTRAMUSCULAR | Status: AC
  Administered 2023-04-11: 0.5 mL via INTRAMUSCULAR
  Filled 2023-04-11: qty 0.5

## 2023-04-11 MED ORDER — LEVOFLOXACIN 750 MG PO TABS
750.0000 mg | ORAL_TABLET | Freq: Every day | ORAL | Status: DC
  Administered 2023-04-11: 750 mg via ORAL
  Filled 2023-04-11: qty 1

## 2023-04-11 NOTE — Plan of Care (Signed)
  Problem: Health Behavior/Discharge Planning: Goal: Ability to manage health-related needs will improve Outcome: Progressing   Problem: Nutritional: Goal: Maintenance of adequate nutrition will improve Outcome: Progressing   Problem: Tissue Perfusion: Goal: Adequacy of tissue perfusion will improve Outcome: Progressing   Problem: Education: Goal: Knowledge of General Education information will improve Description: Including pain rating scale, medication(s)/side effects and non-pharmacologic comfort measures Outcome: Progressing   Problem: Clinical Measurements: Goal: Ability to maintain clinical measurements within normal limits will improve Outcome: Progressing Goal: Will remain free from infection Outcome: Progressing

## 2023-04-11 NOTE — Progress Notes (Signed)
       RE:   Linda [name]    Date of Birth:  December 08, 1970   Date:   04/11/2023     To Whom It May Concern:  Please be advised that the above-named patient will require a short-term nursing home stay - anticipated 30 days or less for rehabilitation and strengthening.  The plan is for return home.

## 2023-04-11 NOTE — Progress Notes (Signed)
 Orthopedic Tech Progress Note Patient Details:  Linda [name] 10-Jan-1971 [id number]  Ortho Devices Type of Ortho Device: CAM walker Ortho Device/Splint Location: LLE Ortho Device/Splint Interventions: Ordered, Application, Adjustment, Removal   Post Interventions Patient Tolerated: Well Instructions Provided: Care of device  Donald Pore 04/11/2023, 12:55 PM

## 2023-04-11 NOTE — NC FL2 (Signed)
 [place] MEDICAID FL2 LEVEL OF CARE FORM     IDENTIFICATION  Patient Name: Linda [name] Birthdate: [birth year] Sex: female Admission Date (Current Location): 04/07/2023  [facility] and [place] Number:  Producer, television/film/video and Address:  [facility] [place], [address] [place], [place] [address]      Provider Number: [id number]  Attending Physician Name and Address:  Dorcas Carrow, MD  Relative Name and Phone Number:       Current Level of Care: Hospital Recommended Level of Care: Skilled Nursing Facility Prior Approval Number:    Date Approved/Denied:   PASRR Number: Pending  Discharge Plan: SNF    Current Diagnoses: Patient Active Problem List   Diagnosis Date Noted   Diabetic foot (HCC) 04/07/2023   Bilateral leg cramps 03/31/2023   Skin yeast infection 03/31/2023   Post herpetic neuralgia 01/24/2023   Sinus tachycardia 01/06/2023   Encounter for immunization 10/13/2022   Anemia 09/21/2022   Thrombocytopenia (HCC) 09/21/2022   Difficulty urinating 09/18/2022   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   Muscle spasms of lower extremity, unspecified laterality 09/17/2022   RLS (restless legs syndrome) 09/17/2022   Chronic cough 06/22/2022   Adjustment insomnia 05/29/2022   Gross hematuria 05/29/2022   Dizziness 05/29/2022   Pre-operative examination 03/03/2022   Pulmonary nodule 12/30/2021   Back pain of lumbar region with sciatica 10/01/2021   Erythema of foot 10/01/2021   Right hip pain 06/27/2021   Left hip pain 06/27/2021   Falls frequently 06/23/2021   Essential hypertension, benign 03/23/2021   Type 2 diabetes mellitus with hyperglycemia (HCC) 01/20/2021   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   Binge eating disorder 11/23/2020   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 04/17/2020   Osteomyelitis of toe of right foot (HCC) 04/17/2020   Idiopathic thrombocytopenic purpura (ITP) (HCC) 01/27/2020   Malignant  neoplasm of lower-inner quadrant of right breast of female, estrogen receptor negative (HCC) 10/01/2019   Chronic pain syndrome 07/08/2019   Uncomplicated opioid dependence (HCC) 07/08/2019   Mixed hyperlipidemia 04/11/2019   Neuropathy due to secondary diabetes mellitus (HCC) 04/11/2019   Acquired hypothyroidism 04/11/2019   Vitamin D insufficiency 04/11/2019   Class 2 severe obesity due to excess calories with serious comorbidity and body mass index (BMI) of 38.0 to 38.9 in adult (HCC) 04/11/2019   Liver cirrhosis secondary to NASH (nonalcoholic steatohepatitis) (HCC) 09/13/2018   BRCA1 positive 06/14/2016    Orientation RESPIRATION BLADDER Height & Weight     Self, Situation, Time, Place  Normal Continent Weight: 207 lb 0.2 oz (93.9 kg) (bed) Height:  5\' 2"  (157.5 cm)  BEHAVIORAL SYMPTOMS/MOOD NEUROLOGICAL BOWEL NUTRITION STATUS      Continent Diet (see d/c summary)  AMBULATORY STATUS COMMUNICATION OF NEEDS Skin   Extensive Assist Verbally Other (Comment) (left 4th toe amputation;  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 to diabetic foot ulcers.)                       Personal Care Assistance Level of Assistance  Bathing, Feeding, Dressing Bathing Assistance: Maximum assistance Feeding assistance: Independent Dressing Assistance: Limited assistance     Functional Limitations Info  Sight, Hearing, Speech Sight Info: Impaired Hearing Info: Adequate Speech Info: Adequate    SPECIAL CARE FACTORS FREQUENCY  OT (By licensed OT), PT (By licensed PT)     PT Frequency: 5x/week OT Frequency: 5x/week            Contractures  Contractures Info: Not present    Additional Factors Info  Code Status, Allergies Code Status Info: full code Allergies Info: Augmentin (amoxicillin-pot Clavulanate), codeine, Celebrex (celecoxib),Simvastatin, Vytorin (ezetimibe-simvastatin), Zetia (ezetimibe), Propranolol Hcl           Current Medications (04/11/2023):  This  is the current hospital active medication list Current Facility-Administered Medications  Medication Dose Route Frequency Provider Last Rate Last Admin   acetaminophen (TYLENOL) tablet 650 mg  650 mg Oral Q6H PRN Synetta Fail, MD   650 mg at 04/09/23 2150   Or   acetaminophen (TYLENOL) suppository 650 mg  650 mg Rectal Q6H PRN Synetta Fail, MD       albuterol (PROVENTIL) (2.5 MG/3ML) 0.083% nebulizer solution 2.5 mg  2.5 mg Inhalation Q6H PRN Synetta Fail, MD   2.5 mg at 04/08/23 1717   amitriptyline (ELAVIL) tablet 25 mg  25 mg Oral QHS Synetta Fail, MD   25 mg at 04/10/23 2330   atorvastatin (LIPITOR) tablet 10 mg  10 mg Oral QPM Synetta Fail, MD   10 mg at 04/10/23 1722   buPROPion (WELLBUTRIN XL) 24 hr tablet 300 mg  300 mg Oral q morning Synetta Fail, MD   300 mg at 04/11/23 1022   busPIRone (BUSPAR) tablet 5 mg  5 mg Oral TID Synetta Fail, MD   5 mg at 04/11/23 1022   cariprazine (VRAYLAR) capsule 3 mg  3 mg Oral Daily Synetta Fail, MD   3 mg at 04/11/23 1022   Chlorhexidine Gluconate Cloth 2 % PADS 6 each  6 each Topical Daily Dorcas Carrow, MD   6 each at 04/10/23 2352   gabapentin (NEURONTIN) capsule 600 mg  600 mg Oral TID Synetta Fail, MD   600 mg at 04/11/23 1021   HYDROmorphone (DILAUDID) injection 1 mg  1 mg Intravenous Q4H PRN Bronwen Betters L, DPM   1 mg at 04/09/23 2015   ibuprofen (ADVIL) tablet 600 mg  600 mg Oral Q6H PRN Jamse Arn, Jake L, DPM       insulin aspart (novoLOG) injection 0-15 Units  0-15 Units Subcutaneous TID WC Synetta Fail, MD   8 Units at 04/11/23 2440   insulin aspart (novoLOG) injection 0-5 Units  0-5 Units Subcutaneous QHS Synetta Fail, MD       insulin glargine (LANTUS) injection 60 Units  60 Units Subcutaneous QHS Dorcas Carrow, MD       levothyroxine (SYNTHROID) tablet 75 mcg  75 mcg Oral Q0600 Synetta Fail, MD   75 mcg at 04/11/23 0555   lidocaine (LIDODERM) 5 % 3 patch  3  patch Transdermal Q24H Synetta Fail, MD       lisdexamfetamine (VYVANSE) capsule 70 mg  70 mg Oral QHS Silvana Newness, RPH   70 mg at 04/10/23 2330   losartan (COZAAR) tablet 50 mg  50 mg Oral QPM Synetta Fail, MD   50 mg at 04/10/23 1722   metoprolol succinate (TOPROL-XL) 24 hr tablet 12.5 mg  12.5 mg Oral Daily Synetta Fail, MD   12.5 mg at 04/11/23 1021   morphine (MS CONTIN) 12 hr tablet 30 mg  30 mg Oral Q12H Synetta Fail, MD   30 mg at 04/11/23 1021   mupirocin ointment (BACTROBAN) 2 % 1 Application  1 Application Nasal BID Synetta Fail, MD   1 Application at 04/11/23 1022   oxyCODONE (Oxy IR/ROXICODONE) immediate release tablet 5  mg  5 mg Oral Q4H PRN Bronwen Betters L, DPM   5 mg at 04/11/23 0555   pantoprazole (PROTONIX) EC tablet 40 mg  40 mg Oral BID Synetta Fail, MD   40 mg at 04/11/23 1021   pneumococcal 20-valent conjugate vaccine (PREVNAR 20) injection 0.5 mL  0.5 mL Intramuscular Tomorrow-1000 Dorcas Carrow, MD       polyethylene glycol (MIRALAX / GLYCOLAX) packet 17 g  17 g Oral Daily PRN Synetta Fail, MD   17 g at 04/10/23 2348   potassium chloride SA (KLOR-CON M) CR tablet 20 mEq  20 mEq Oral BID Dorcas Carrow, MD   20 mEq at 04/10/23 2336   ramelteon (ROZEREM) tablet 8 mg  8 mg Oral QHS Synetta Fail, MD   8 mg at 04/10/23 2336   rOPINIRole (REQUIP) tablet 0.25 mg  0.25 mg Oral QHS Synetta Fail, MD   0.25 mg at 04/10/23 2334   sodium chloride flush (NS) 0.9 % injection 3 mL  3 mL Intravenous Q12H Synetta Fail, MD   3 mL at 04/11/23 1023   sorbitol 70 % solution 30 mL  30 mL Oral Daily PRN Synetta Fail, MD       vancomycin (VANCOCIN) IVPB 1000 mg/200 mL premix  1,000 mg Intravenous Q24H Paytes, Austin A, RPH 200 mL/hr at 04/10/23 1821 1,000 mg at 04/10/23 1821   Facility-Administered Medications Ordered in Other Encounters  Medication Dose Route Frequency Provider Last Rate Last Admin   sodium chloride  flush (NS) 0.9 % injection 10 mL  10 mL Intracatheter PRN Mosher, Harvin Hazel A, PA-C   10 mL at 02/19/21 1004     Discharge Medications: Please see discharge summary for a list of discharge medications.  Relevant Imaging Results:  Relevant Lab Results:   Additional Information SSN [id number] [place], [place]

## 2023-04-11 NOTE — TOC Initial Note (Signed)
 Transition of Care [facility]) - Initial/Assessment Note    Patient Details  Name: Linda [name] MRN: [id number] Date of Birth: 01/23/1971  Transition of Care [facility]) CM/SW Contact:    Erin Sons, LCSW Phone Number: 04/11/2023, 10:32 AM  Clinical Narrative:                  CSW met with pt and discussed PT/OT recs for SNF. Pt is agreeable to SNF w/u. She would like a facility near [place] and prefers [facility]. CSW explained medicare coverage and SNF auth process. Fl2 completed and bed requests sent in hub. PASRR is pending.   Expected Discharge Plan: Skilled Nursing Facility Barriers to Discharge: Continued Medical Work up, English as a second language teacher, SNF Pending bed offer   Patient Goals and CMS Choice   CMS Medicare.gov Compare Post Acute Care list provided to:: Patient Choice offered to / list presented to : Patient      Expected Discharge Plan and Services       Living arrangements for the past 2 months: Apartment                                      Prior Living Arrangements/Services Living arrangements for the past 2 months: Apartment   Patient language and need for interpreter reviewed:: Yes        Need for Family Participation in Patient Care: No (Comment) Care giver support system in place?: No (comment)   Criminal Activity/Legal Involvement Pertinent to Current Situation/Hospitalization: No - Comment as needed  Activities of Daily Living   ADL Screening (condition at time of admission) Independently performs ADLs?: Yes (appropriate for developmental age) Is the patient deaf or have difficulty hearing?: No Does the patient have difficulty seeing, even when wearing glasses/contacts?: No Does the patient have difficulty concentrating, remembering, or making decisions?: No  Permission Sought/Granted                  Emotional Assessment Appearance:: Appears stated age Attitude/Demeanor/Rapport: Engaged Affect (typically observed):  Accepting Orientation: : Oriented to Self, Oriented to Place, Oriented to  Time, Oriented to Situation Alcohol / Substance Use: Not Applicable Psych Involvement: No (comment)  Admission diagnosis:  Diabetic foot (HCC) [E11.8] Visit for wound check [Z51.89] Patient Active Problem List   Diagnosis Date Noted   Diabetic foot (HCC) 04/07/2023   Bilateral leg cramps 03/31/2023   Skin yeast infection 03/31/2023   Post herpetic neuralgia 01/24/2023   Sinus tachycardia 01/06/2023   Encounter for immunization 10/13/2022   Anemia 09/21/2022    Class: Chronic   Thrombocytopenia (HCC) 09/21/2022    Class: Chronic   Difficulty urinating 09/18/2022   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   Muscle spasms of lower extremity, unspecified laterality 09/17/2022   RLS (restless legs syndrome) 09/17/2022   Chronic cough 06/22/2022   Adjustment insomnia 05/29/2022   Gross hematuria 05/29/2022   Dizziness 05/29/2022   Pre-operative examination 03/03/2022   Pulmonary nodule 12/30/2021   Back pain of lumbar region with sciatica 10/01/2021   Erythema of foot 10/01/2021   Right hip pain 06/27/2021   Left hip pain 06/27/2021   Falls frequently 06/23/2021   Essential hypertension, benign 03/23/2021   Type 2 diabetes mellitus with hyperglycemia (HCC) 01/20/2021   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   Binge eating disorder 11/23/2020   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 04/17/2020   Osteomyelitis of toe of right foot (  HCC) 04/17/2020   Idiopathic thrombocytopenic purpura (ITP) (HCC) 01/27/2020   Malignant neoplasm of lower-inner quadrant of right breast of female, estrogen receptor negative (HCC) 10/01/2019   Chronic pain syndrome 07/08/2019   Uncomplicated opioid dependence (HCC) 07/08/2019   Mixed hyperlipidemia 04/11/2019   Neuropathy due to secondary diabetes mellitus (HCC) 04/11/2019   Acquired hypothyroidism 04/11/2019   Vitamin D insufficiency 04/11/2019   Class 2 severe  obesity due to excess calories with serious comorbidity and body mass index (BMI) of 38.0 to 38.9 in adult (HCC) 04/11/2019   Liver cirrhosis secondary to NASH (nonalcoholic steatohepatitis) (HCC) 09/13/2018    Class: Chronic   BRCA1 positive 06/14/2016   PCP:  [name], MD Pharmacy:   [facility] - [place], [place] - [address] [place] [address] Phone: [phone] Fax: [phone]     Social Drivers of Health (SDOH) Social History: SDOH Screenings   Food Insecurity: Food Insecurity Present (04/08/2023)  Housing: Low Risk  (04/08/2023)  Transportation Needs: No Transportation Needs (04/08/2023)  Utilities: Not At Risk (04/08/2023)  Alcohol Screen: Low Risk  (03/01/2022)  Depression (PHQ2-9): Medium Risk (01/24/2023)  Financial Resource Strain: High Risk (01/21/2023)  Physical Activity: Inactive (01/21/2023)  Social Connections: Moderately Isolated (01/21/2023)  Stress: Stress Concern Present (01/21/2023)  Tobacco Use: Low Risk  (04/09/2023)  Health Literacy: Adequate Health Literacy (09/22/2022)   SDOH Interventions:     Readmission Risk Interventions     No data to display

## 2023-04-11 NOTE — Progress Notes (Signed)
 PROGRESS NOTE    Linda [name]  ZDG:[id number] DOB: 02/03/1970 DOA: 04/07/2023 PCP: [name], MD    Brief Narrative:  53 year old with history of hypertension, hyperlipidemia, type 2 diabetes on insulin, neuropathy, previous multiple toe amputations due to diabetic foot ulcer sent from podiatry office for nonhealing left third toe wound.  Admitted.  Found to have osteomyelitis.  On antibiotics.  Underwent surgical amputation.  Subjective:  Patient seen and examined.  No overnight events.  She was not able to ambulate well yesterday with physical therapy. She is interested on going to SNF as she may not qualify to go to CIR. Wound to be examined today, reported clean surgical margins.  Will stop vancomycin and start on Levaquin for 7 days.  Assessment & Plan:   Diabetic foot infection, failed outpatient therapy.  MRI consistent with osteomyelitis of the third toe. Status post left-sided TMA 3/16.  Surgical cultures with rare Klebsiella and Enterococcus.  Blood cultures negative so far. Nonweightbearing.  Incision intact.  Reported clean surgical margins. Received various antibiotics through IV for the last 3 days. Will treat with Levaquin for 7 days.  Patient is allergy to penicillin and Augmentin. Work with PT OT.  Adequate pain control. She will need short-term rehab before going home.  Type 2 diabetes: Well-controlled as per patient.  Recent A1c 6.7.  Blood sugars elevated now.  Go back on 60 units as she is eating regular diet now.  She is on Mounjaro at home and she can resume that on discharge.  Chronic medical issues including Hypertension, stable Hyperlipidemia, on statin Neuropathy, on gabapentin Chronic anemia and thrombocytopenia secondary to cirrhosis, stable. Platelets are historically at baseline. Chronic pain syndrome, on gabapentin and morphine. Depression, on amitriptyline, Wellbutrin, BuSpar, Vraylar and Fetzima.  Continue.  DVT prophylaxis: SCDs  Start: 04/07/23 1847   Code Status: Full code Family Communication: None at bedside Disposition Plan: Status is: Inpatient Remains inpatient appropriate because: Stabilizing.  Needs SNF.     Consultants:  Podiatry  Procedures:  None  Antimicrobials:  Flagyl and cefepime 3/14--3/17 Vancomycin 3/14--- 3/18 Levaquin 3/18---     Objective: Vitals:   04/10/23 2022 04/11/23 0424 04/11/23 0750 04/11/23 0811  BP: 114/67 (!) 111/58 (!) 100/48 109/60  Pulse: 91 87 94 94  Resp: 18 17 17    Temp: 98.1 F (36.7 C) 97.6 F (36.4 C) 98.3 F (36.8 C)   TempSrc:      SpO2: 96% 100% 90%   Weight:      Height:        Intake/Output Summary (Last 24 hours) at 04/11/2023 1024 Last data filed at 04/10/2023 1500 Gross per 24 hour  Intake 240 ml  Output --  Net 240 ml   Filed Weights   04/08/23 1057  Weight: 93.9 kg    Examination:  General exam: Appears calm and comfortable .  Pleasant interactive. Respiratory system: Clear to auscultation. Respiratory effort normal. Cardiovascular system: S1 & S2 heard,  Gastrointestinal system: Soft nontender. Central nervous system: Alert and oriented. No focal neurological deficits. Extremities: Symmetric 5 x 5 power. Skin:  Right foot with multiple lost toes, clean and healthy.  Nontender. Left foot dressing intact postop.  Back splint present.  Not examined by me.    Data Reviewed: I have personally reviewed following labs and imaging studies  CBC: Recent Labs  Lab 04/07/23 1652 04/08/23 0751  WBC 6.8 3.0*  NEUTROABS 4.2  --   HGB 12.8 10.4*  HCT 41.5 33.6*  MCV  83.7 83.8  PLT 99* 64*   Basic Metabolic Panel: Recent Labs  Lab 04/07/23 1652 04/08/23 0751 04/10/23 0450  NA 140 139 139  K 4.1 3.7 3.3*  CL 108 108 107  CO2 22 23 23   GLUCOSE 104* 180* 149*  BUN 9 8 6   CREATININE 1.42* 0.96 0.91  CALCIUM 8.8* 7.9* 8.4*   GFR: Estimated Creatinine Clearance: 75.4 mL/min (by C-G formula based on SCr of 0.91  mg/dL). Liver Function Tests: Recent Labs  Lab 04/07/23 2129 04/08/23 0751  AST 20 17  ALT 13 12  ALKPHOS 73 62  BILITOT 0.6 0.5  PROT 6.2* 5.3*  ALBUMIN 3.2* 2.6*   No results for input(s): "LIPASE", "AMYLASE" in the last 168 hours. No results for input(s): "AMMONIA" in the last 168 hours. Coagulation Profile: No results for input(s): "INR", "PROTIME" in the last 168 hours. Cardiac Enzymes: No results for input(s): "CKTOTAL", "CKMB", "CKMBINDEX", "TROPONINI" in the last 168 hours. BNP (last 3 results) No results for input(s): "PROBNP" in the last 8760 hours. HbA1C: No results for input(s): "HGBA1C" in the last 72 hours. CBG: Recent Labs  Lab 04/10/23 0752 04/10/23 1207 04/10/23 1705 04/10/23 2033 04/11/23 0747  GLUCAP 154* 167* 160* 173* 256*   Lipid Profile: No results for input(s): "CHOL", "HDL", "LDLCALC", "TRIG", "CHOLHDL", "LDLDIRECT" in the last 72 hours. Thyroid Function Tests: No results for input(s): "TSH", "T4TOTAL", "FREET4", "T3FREE", "THYROIDAB" in the last 72 hours. Anemia Panel: No results for input(s): "VITAMINB12", "FOLATE", "FERRITIN", "TIBC", "IRON", "RETICCTPCT" in the last 72 hours. Sepsis Labs: No results for input(s): "PROCALCITON", "LATICACIDVEN" in the last 168 hours.  Recent Results (from the past 240 hours)  Surgical pcr screen     Status: None   Collection Time: 04/09/23  6:12 AM   Specimen: Nasal Mucosa; Nasal Swab  Result Value Ref Range Status   MRSA, PCR NEGATIVE NEGATIVE Final   Staphylococcus aureus NEGATIVE NEGATIVE Final    Comment: (NOTE) The Xpert SA Assay (FDA approved for NASAL specimens in patients 2 years of age and older), is one component of a comprehensive surveillance program. It is not intended to diagnose infection nor to guide or monitor treatment. Performed at [facility], [address], [place], [place] [address]   Aerobic/Anaerobic Culture w Gram Stain (surgical/deep wound)     Status: None  (Preliminary result)   Collection Time: 04/09/23  8:27 AM   Specimen: PATH Amputaion Arm/Leg; Tissue  Result Value Ref Range Status   Specimen Description TISSUE  Final   Special Requests 4th MPJ  Final   Gram Stain RARE WBC SEEN NO ORGANISMS SEEN   Final   Culture   Final    RARE KLEBSIELLA PNEUMONIAE RARE ENTEROCOCCUS FAECALIS CULTURE REINCUBATED FOR BETTER GROWTH Performed at [facility], [address], [place], [place] [address]    Report Status PENDING  Incomplete         Radiology Studies: VAS [name] ABI WITH/WO TBI Result Date: 04/10/2023  LOWER EXTREMITY DOPPLER STUDY Patient Name:  Addysen Louth  Date of Exam:   04/10/2023 Medical Rec #: [id number]              Accession #:    [id number] Date of Birth: 1970/04/23              Patient Gender: F Patient Age:   32 years Exam Location:  [facility] Procedure:      VAS [name] ABI WITH/WO TBI Referring Phys: Leta Jungling  SEMON --------------------------------------------------------------------------------  Indications: Ulceration. High Risk Factors: Hypertension, hyperlipidemia, Diabetes. Other Factors: Prior RT toe amputations.  Vascular Interventions: Left transmetatarsal amputation 04/11/23. Limitations: Today's exam was limited due to bandages and an open wound. Comparison Study: None. Performing Technologist: Shona Simpson  Examination Guidelines: A complete evaluation includes at minimum, Doppler waveform signals and systolic blood pressure reading at the level of bilateral brachial, anterior tibial, and posterior tibial arteries, when vessel segments are accessible. Bilateral testing is considered an integral part of a complete examination. Photoelectric Plethysmograph (PPG) waveforms and toe systolic pressure readings are included as required and additional duplex testing as needed. Limited examinations for reoccurring indications may be performed as noted.  ABI Findings:  +---------+------------------+-----+---------+----------+ Right    Rt Pressure (mmHg)IndexWaveform Comment    +---------+------------------+-----+---------+----------+ Brachial 118                    triphasic           +---------+------------------+-----+---------+----------+ PTA      128               1.08 biphasic            +---------+------------------+-----+---------+----------+ DP       136               1.15 triphasic           +---------+------------------+-----+---------+----------+ Great Toe                                Amputation +---------+------------------+-----+---------+----------+ +--------+------------------+-----+----------+-------+ Left    Lt Pressure (mmHg)IndexWaveform  Comment +--------+------------------+-----+----------+-------+ [id number]                    monophasic        +--------+------------------+-----+----------+-------+ PTA                                      N/A     +--------+------------------+-----+----------+-------+ DP                                       N/A     +--------+------------------+-----+----------+-------+ +-------+-----------+-----------+------------+------------+ ABI/TBIToday's ABIToday's TBIPrevious ABIPrevious TBI +-------+-----------+-----------+------------+------------+ Right  1.15       N/A                                 +-------+-----------+-----------+------------+------------+ Left   N/A        N/A                                 +-------+-----------+-----------+------------+------------+  Summary: Right: Resting right ankle-brachial index is within normal range. Left: Unable to assess due to bandaging from surgery the previous day. *See table(s) above for measurements and observations.  Electronically signed by Gerarda Fraction on 04/10/2023 at 5:30:49 PM.    Final         Scheduled Meds:  amitriptyline  25 mg Oral QHS   atorvastatin  10 mg Oral QPM   buPROPion  300 mg  Oral q morning   busPIRone  5 mg Oral TID   cariprazine  3 mg Oral Daily   Chlorhexidine Gluconate Cloth  6 each  Topical Daily   gabapentin  600 mg Oral TID   insulin aspart  0-15 Units Subcutaneous TID WC   insulin aspart  0-5 Units Subcutaneous QHS   insulin glargine  60 Units Subcutaneous QHS   levofloxacin  750 mg Oral Daily   levothyroxine  75 mcg Oral Q0600   lidocaine  3 patch Transdermal Q24H   lisdexamfetamine  70 mg Oral QHS   losartan  50 mg Oral QPM   metoprolol succinate  12.5 mg Oral Daily   morphine  30 mg Oral Q12H   mupirocin ointment  1 Application Nasal BID   pantoprazole  40 mg Oral BID   pneumococcal 20-valent conjugate vaccine  0.5 mL Intramuscular Tomorrow-1000   potassium chloride  20 mEq Oral BID   ramelteon  8 mg Oral QHS   rOPINIRole  0.25 mg Oral QHS   sodium chloride flush  3 mL Intravenous Q12H   Continuous Infusions:     LOS: 4 days    Time spent: 40 minutes    Dorcas Carrow, MD [facility]

## 2023-04-11 NOTE — Plan of Care (Signed)
  Problem: Coping: Goal: Ability to adjust to condition or change in health will improve Outcome: Progressing   Problem: Fluid Volume: Goal: Ability to maintain a balanced intake and output will improve Outcome: Progressing   Problem: Health Behavior/Discharge Planning: Goal: Ability to identify and utilize available resources and services will improve Outcome: Progressing Goal: Ability to manage health-related needs will improve Outcome: Progressing   Problem: Metabolic: Goal: Ability to maintain appropriate glucose levels will improve Outcome: Progressing   Problem: Nutritional: Goal: Maintenance of adequate nutrition will improve Outcome: Progressing   Problem: Skin Integrity: Goal: Risk for impaired skin integrity will decrease Outcome: Progressing   Problem: Tissue Perfusion: Goal: Adequacy of tissue perfusion will improve Outcome: Progressing   Problem: Education: Goal: Knowledge of General Education information will improve Description: Including pain rating scale, medication(s)/side effects and non-pharmacologic comfort measures Outcome: Progressing

## 2023-04-11 NOTE — Progress Notes (Signed)
 Physical Therapy Treatment Patient Details Name: Linda [name] MRN: [id number] DOB: [birth year] Today's Date: 04/11/2023   History of Present Illness Pt is a 53 year old femal admitted with ostemyelitis and non-healing left 4th toe wound.  Underwent fourth toe amputaion on 3/16 and achilles tendon lengthening.   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    PT Comments  Continuing work on functional mobility and activity tolerance;  Session focused on prcticing stand pivot transfers, with "heel-toe pivots" on RLE; Overall able to perform, but noting decr coordination with heel-toe pivots; noteworthy improvements in sit to stand trasnfers, and able to hop-step more today, though still quite taxing;   Pt mentioned that if unable to go to SNF for post-acute rehab, she will have to go home (potentially tomorrow); PT still recommends SNF for post-acute rehab to maximize independence and safety with mobility and ADLs as pt lives alone; If home is an absolute must, will be a wheelchair transfer functional level;   Overall progressing well; Anticipate continuing good progress at post-acute rehabilitation.    If plan is discharge home, recommend the following: A lot of help with walking and/or transfers;A lot of help with bathing/dressing/bathroom   Can travel by private vehicle        Equipment Recommendations  Rolling walker (2 wheels);BSC/3in1;Wheelchair (measurements PT);Wheelchair cushion (measurements PT)    Recommendations for Other Services       Precautions / Restrictions Precautions Precautions: Fall Recall of Precautions/Restrictions: Intact Restrictions LLE Weight Bearing Per Provider Order: Non weight bearing     Mobility  Bed Mobility Overal bed mobility: Modified Independent                  Transfers Overall transfer level: Needs  assistance Equipment used: Rolling walker (2 wheels) Transfers: Sit to/from Stand, Bed to chair/wheelchair/BSC Sit to Stand: Min assist   Step pivot transfers: Min assist       General transfer comment: Min instructional cueing for hand placement and technique for sit to stand and stand to sit transition; able to perform "heel-toe pivot" on RFoot for transfer to recliner on pt's R side    Ambulation/Gait Ambulation/Gait assistance: Min assist (second person present for chair follow) Gait Distance (Feet): 5 Feet Assistive device: Rolling walker (2 wheels) Gait Pattern/deviations: Decreased step length - right, Trunk flexed Gait velocity: decreased     General Gait Details: Difficulty with "hop-steps" secondary to bil UE weakness, but improved from eval;  pt able to accept full weight onto RW for R foot advancement   Stairs             Wheelchair Mobility     Tilt Bed    Modified Rankin (Stroke Patients Only)       Balance     Sitting balance-Leahy Scale: Fair       Standing balance-Leahy Scale: Poor                              Communication Communication Communication: No apparent difficulties  Cognition Arousal: Alert Behavior During Therapy: WFL for tasks assessed/performed   PT - Cognitive impairments: No apparent impairments                         Following commands: Intact      Cueing Cueing Techniques: Verbal cues  Exercises Other Exercises Other Exercises: Chair pushups with RLE assist x10    General Comments General comments (skin integrity, edema, etc.): Pt mentioned that if not apporved for SNF, she may have to dc home alone      Pertinent Vitals/Pain Pain Assessment Pain Assessment: Faces Faces Pain Scale: Hurts little more Pain Location: LLE Pain Descriptors / Indicators: Discomfort Pain Intervention(s): Monitored during session    Home Living                          Prior Function             PT Goals (current goals can now be found in the care plan section) Acute Rehab PT Goals Patient Stated Goal: To be able to manage safely at home PT Goal Formulation: With patient Time For Goal Achievement: 04/24/23 Potential to Achieve Goals: Good Progress towards PT goals: Progressing toward goals    Frequency    Min 2X/week      PT Plan      Co-evaluation              AM-PAC PT "6 Clicks" Mobility   Outcome Measure  Help needed turning from your back to your side while in a flat bed without using bedrails?: None Help needed moving from lying on your back to sitting on the side of a flat bed without using bedrails?: None Help needed moving to and from a bed to a chair (including a wheelchair)?: A Lot Help needed standing up from a chair using your arms (e.g., wheelchair or bedside chair)?: A Little Help needed to walk in hospital room?: Total Help needed climbing 3-5 steps with a railing? : Total 6 Click Score: 15    End of Session Equipment Utilized During Treatment: Gait belt Activity Tolerance: Patient tolerated treatment well Patient left: in chair;with call bell/phone within reach;with chair alarm set Nurse Communication: Mobility status PT Visit Diagnosis: Unsteadiness on feet (R26.81);Muscle weakness (generalized) (M62.81);Pain Pain - Right/Left: Left Pain - part of body: Ankle and joints of foot     Time: 7829-5621 PT Time Calculation (min) (ACUTE ONLY): 33 min  Charges:    $Gait Training: 8-22 mins $Therapeutic Activity: 8-22 mins PT General Charges $$ ACUTE PT VISIT: 1 Visit                     Van Clines, PT  Acute Rehabilitation Services Office [phone] Secure Chat welcomed    Levi Aland 04/11/2023, 5:51 PM

## 2023-04-11 NOTE — TOC Progression Note (Signed)
 Transition of Care [facility]) - Progression Note    Patient Details  Name: Linda [name] MRN: [id number] Date of Birth: 1970-04-20  Transition [facility] CM/SW Contact  Erin Sons, [place] Phone Number: 04/11/2023, 3:39 PM  Clinical Narrative:     Pt has no SNF bed offers. [facility] is listed as "considering." CSW contacted [facility] to discuss pt's referral; awaiting response.   Expected Discharge Plan: Skilled Nursing Facility Barriers to Discharge: Continued Medical Work up, English as a second language teacher, SNF Pending bed offer  Expected Discharge Plan and Services       Living arrangements for the past 2 months: Apartment                                       Social Determinants of Health (SDOH) Interventions SDOH Screenings   Food Insecurity: Food Insecurity Present (04/08/2023)  Housing: Low Risk  (04/08/2023)  Transportation Needs: No Transportation Needs (04/08/2023)  Utilities: Not At Risk (04/08/2023)  Alcohol Screen: Low Risk  (03/01/2022)  Depression (PHQ2-9): Medium Risk (01/24/2023)  Financial Resource Strain: High Risk (01/21/2023)  Physical Activity: Inactive (01/21/2023)  Social Connections: Moderately Isolated (01/21/2023)  Stress: Stress Concern Present (01/21/2023)  Tobacco Use: Low Risk  (04/09/2023)  Health Literacy: Adequate Health Literacy (09/22/2022)    Readmission Risk Interventions     No data to display

## 2023-04-11 NOTE — Progress Notes (Signed)
  Subjective:  Patient ID: Linda [name], female    DOB: Mar 02, 1970,  MRN: [id number]   DOS: 04/09/23 Procedure: Left foot trans metatarsal amputaiton, tendo achilles lengthening  53 y.o. female seen for post op check. She reports she Is doing well, has worked with PT today, pain is controlled currently. Denies concerns.  Review of Systems: Negative except as noted in the HPI. Denies N/V/F/Ch.   Objective:   Vitals:   04/11/23 0750 04/11/23 0811  BP: (!) 100/48 109/60  Pulse: 94 94  Resp: 17   Temp: 98.3 F (36.8 C)   SpO2: 90%    Body mass index is 37.86 kg/m. Constitutional Well developed. Well nourished.  Vascular Foot warm and well perfused. Capillary refill normal to all digits.   No calf pain with palpation  Neurologic Normal speech. Oriented to person, place, and time. Epicritic sensation diminished   Dermatologic Mild maceration and prior bleeding noted from lateral amp site with small amount of coagulated blood present, no active bleeding or dehiscence.      Orthopedic: S/p L TMA, TAL   Radiographs: Interval first through fifth ray amputation to the proximal metatarsal shaft.  Pathology: pending  Micro: K.  Pneumoniae, E faecalis  Assessment:   L foot osteomyelitis s/p  TMA, TAL  Plan:  Patient was evaluated and treated and all questions answered.  POD # 2 s/p TMA and TAL on left side -Progressing as expected post op, pain controlled at present - Dressing changed, leave this dressing c/d/I until follow up next week Monday  -XR: expected post op changes -WB Status: strict NWB to RLE now in CAM boot - ordered (does not need to wear while in bed)  - may need to consider SNF disposition, will defer to CM to discuss further with Pt.   -Sutures: remain intact. -Medications/ABX: Continue to follow cultures, 7 days PO abx such as augmentin or per culture data on discharge - Patient will follow up next week Monday or Tuesday in [place] office with  Dr. Jamse Arn. I will have our schedulers call to set up. - Will sign off, please msg with questions or concerns.           Corinna Gab, DPM [facility]

## 2023-04-12 ENCOUNTER — Other Ambulatory Visit: Payer: Self-pay | Admitting: Family Medicine

## 2023-04-12 ENCOUNTER — Other Ambulatory Visit (HOSPITAL_COMMUNITY): Payer: Self-pay

## 2023-04-12 ENCOUNTER — Encounter: Payer: Self-pay | Admitting: Hematology and Oncology

## 2023-04-12 DIAGNOSIS — Z5189 Encounter for other specified aftercare: Secondary | ICD-10-CM | POA: Diagnosis not present

## 2023-04-12 DIAGNOSIS — K7460 Unspecified cirrhosis of liver: Secondary | ICD-10-CM

## 2023-04-12 DIAGNOSIS — F50819 Binge eating disorder, unspecified: Secondary | ICD-10-CM

## 2023-04-12 DIAGNOSIS — E118 Type 2 diabetes mellitus with unspecified complications: Secondary | ICD-10-CM | POA: Diagnosis not present

## 2023-04-12 DIAGNOSIS — E1140 Type 2 diabetes mellitus with diabetic neuropathy, unspecified: Secondary | ICD-10-CM

## 2023-04-12 DIAGNOSIS — I10 Essential (primary) hypertension: Secondary | ICD-10-CM

## 2023-04-12 LAB — COMPREHENSIVE METABOLIC PANEL
ALT: 13 U/L (ref 0–44)
AST: 15 U/L (ref 15–41)
Albumin: 2.6 g/dL — ABNORMAL LOW (ref 3.5–5.0)
Alkaline Phosphatase: 51 U/L (ref 38–126)
Anion gap: 7 (ref 5–15)
BUN: 11 mg/dL (ref 6–20)
CO2: 25 mmol/L (ref 22–32)
Calcium: 8.2 mg/dL — ABNORMAL LOW (ref 8.9–10.3)
Chloride: 105 mmol/L (ref 98–111)
Creatinine, Ser: 0.92 mg/dL (ref 0.44–1.00)
GFR, Estimated: >60 mL/min (ref >60)
Glucose, Bld: 206 mg/dL — ABNORMAL HIGH (ref 70–99)
Potassium: 3.7 mmol/L (ref 3.5–5.1)
Sodium: 137 mmol/L (ref 135–145)
Total Bilirubin: 0.5 mg/dL (ref 0.0–1.2)
Total Protein: 5.5 g/dL — ABNORMAL LOW (ref 6.5–8.1)

## 2023-04-12 LAB — CBC WITH DIFFERENTIAL/PLATELET
Abs Immature Granulocytes: 0.01 10*3/uL (ref 0.00–0.07)
Basophils Absolute: 0 10*3/uL (ref 0.0–0.1)
Basophils Relative: 0 %
Eosinophils Absolute: 0.1 10*3/uL (ref 0.0–0.5)
Eosinophils Relative: 2 %
HCT: 32.5 % — ABNORMAL LOW (ref 36.0–46.0)
Hemoglobin: 10.1 g/dL — ABNORMAL LOW (ref 12.0–15.0)
Immature Granulocytes: 0 %
Lymphocytes Relative: 25 %
Lymphs Abs: 1.2 10*3/uL (ref 0.7–4.0)
MCH: 26.6 pg (ref 26.0–34.0)
MCHC: 31.1 g/dL (ref 30.0–36.0)
MCV: 85.8 fL (ref 80.0–100.0)
Monocytes Absolute: 0.4 10*3/uL (ref 0.1–1.0)
Monocytes Relative: 8 %
Neutro Abs: 3.1 10*3/uL (ref 1.7–7.7)
Neutrophils Relative %: 65 %
Platelets: 64 10*3/uL — ABNORMAL LOW (ref 150–400)
RBC: 3.79 MIL/uL — ABNORMAL LOW (ref 3.87–5.11)
RDW: 15 % (ref 11.5–15.5)
WBC: 4.8 10*3/uL (ref 4.0–10.5)
nRBC: 0 % (ref 0.0–0.2)

## 2023-04-12 LAB — SURGICAL PATHOLOGY

## 2023-04-12 LAB — MAGNESIUM: Magnesium: 1.5 mg/dL — ABNORMAL LOW (ref 1.7–2.4)

## 2023-04-12 LAB — GLUCOSE, CAPILLARY
Glucose-Capillary: 177 mg/dL — ABNORMAL HIGH (ref 70–99)
Glucose-Capillary: 237 mg/dL — ABNORMAL HIGH (ref 70–99)

## 2023-04-12 MED ORDER — OXYCODONE HCL 5 MG PO TABS
5.0000 mg | ORAL_TABLET | ORAL | 0 refills | Status: DC | PRN
  Filled 2023-04-12: qty 10, 2d supply, fill #0

## 2023-04-12 MED ORDER — AMOXICILLIN-POT CLAVULANATE 875-125 MG PO TABS
1.0000 | ORAL_TABLET | Freq: Two times a day (BID) | ORAL | 0 refills | Status: AC
  Filled 2023-04-12: qty 12, 6d supply, fill #0

## 2023-04-12 MED ORDER — HEPARIN SOD (PORK) LOCK FLUSH 100 UNIT/ML IV SOLN
500.0000 [IU] | INTRAVENOUS | Status: AC | PRN
  Administered 2023-04-12: 500 [IU]

## 2023-04-12 MED ORDER — MAGNESIUM SULFATE 4 GM/100ML IV SOLN
4.0000 g | Freq: Once | INTRAVENOUS | Status: AC
  Administered 2023-04-12: 4 g via INTRAVENOUS
  Filled 2023-04-12: qty 100

## 2023-04-12 NOTE — Progress Notes (Signed)
 Occupational Therapy Treatment Patient Details Name: Linda [name] MRN: [id number] DOB: 1970-03-27 Today's Date: 04/12/2023   History of present illness Pt is a 53 year old femal admitted with ostemyelitis and non-healing left 4th toe wound.  Underwent fourth toe amputaion on 3/16 and achilles tendon lengthening.   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   OT comments  Patient making good gains with OT treatment. Patient able to get to EOB without assistance. Address donning CAM boot and ortho shoe with patient requiring mod assist for LLE CAM boot but able to donn ortho shoe. Education on hand placement and min assist for transfer to recliner. Patient performed grooming tasks seated due to dependent on UE support to maintain WB precautions. Discussed BSC and tub bench for home use to increase safety at home. Acute OT to continue to follow to address established goals to facilitate DC to next venue of care.        If plan is discharge home, recommend the following:  Assistance with cooking/housework;Assist for transportation;Help with stairs or ramp for entrance;A little help with bathing/dressing/bathroom   Equipment Recommendations  Tub/shower bench;BSC/3in1    Recommendations for Other Services Rehab consult    Precautions / Restrictions Precautions Precautions: Fall Recall of Precautions/Restrictions: Intact Restrictions Weight Bearing Restrictions Per Provider Order: Yes LLE Weight Bearing Per Provider Order: Non weight bearing       Mobility Bed Mobility Overal bed mobility: Modified Independent             General bed mobility comments: no assistanced needed to get to EOB    Transfers Overall transfer level: Needs assistance Equipment used: Rolling walker (2 wheels) Transfers: Sit to/from Stand, Bed to chair/wheelchair/BSC Sit  to Stand: Min assist     Step pivot transfers: Min assist     General transfer comment: cues for hand placement and min assist for transfer to recliner from EOB     Balance Overall balance assessment: Needs assistance Sitting-balance support: Feet supported Sitting balance-Leahy Scale: Fair     Standing balance support: Bilateral upper extremity supported, Reliant on assistive device for balance Standing balance-Leahy Scale: Poor Standing balance comment: Needs BUE support to maintain NWBing on the LLE.                           ADL either performed or assessed with clinical judgement   ADL Overall ADL's : Needs assistance/impaired     Grooming: Wash/dry hands;Wash/dry face;Oral care;Brushing hair;Set up;Sitting Grooming Details (indicate cue type and reason): in recliner             Lower Body Dressing: Sitting/lateral leans;Moderate assistance Lower Body Dressing Details (indicate cue type and reason): Donned left CAM boot with mod assist and able to donn ortho shoe on right Toilet Transfer: Minimal assistance;Rolling walker (2 wheels) Toilet Transfer Details (indicate cue type and reason): simulated           General ADL Comments: able to maintain WB precautions with transfers    Extremity/Trunk Assessment              Vision       Perception     Praxis     Communication Communication Communication: No apparent difficulties   Cognition Arousal: Alert Behavior During Therapy: [facility] for tasks assessed/performed  Following commands: Intact        Cueing   Cueing Techniques: Verbal cues  Exercises      Shoulder Instructions       General Comments Discussed possible needs for DME is she needs to go home; [facility] and tub bench    Pertinent Vitals/ Pain       Pain Assessment Pain Assessment: Faces Faces Pain Scale: Hurts little more Pain Location: LLE Pain Descriptors / Indicators:  Discomfort Pain Intervention(s): Limited activity within patient's tolerance, Monitored during session, Repositioned  Home Living                                          Prior Functioning/Environment              Frequency  Min 2X/week        Progress Toward Goals  OT Goals(current goals can now be found in the care plan section)  Progress towards OT goals: Progressing toward goals  Acute Rehab OT Goals Patient Stated Goal: would like to go to rehab before going back home OT Goal Formulation: With patient Time For Goal Achievement: 04/24/23 Potential to Achieve Goals: Good ADL Goals Pt Will Perform Grooming: with contact guard assist;standing (wash hands and comb hair) Pt Will Perform Lower Body Bathing: with contact guard assist;sit to/from stand Pt Will Perform Lower Body Dressing: with contact guard assist;sit to/from stand Pt Will Transfer to Toilet: with contact guard assist;bedside commode;stand pivot transfer Pt Will Perform Toileting - Clothing Manipulation and hygiene: with contact guard assist;sit to/from stand Pt Will Perform Tub/Shower Transfer: Tub transfer;with contact guard assist;Stand pivot transfer;tub bench  Plan      Co-evaluation                 AM-PAC OT "6 Clicks" Daily Activity     Outcome Measure   Help from another person eating meals?: None Help from another person taking care of personal grooming?: A Little Help from another person toileting, which includes using toliet, bedpan, or urinal?: A Little Help from another person bathing (including washing, rinsing, drying)?: A Little Help from another person to put on and taking off regular upper body clothing?: A Little Help from another person to put on and taking off regular lower body clothing?: A Lot 6 Click Score: 18    End of Session Equipment Utilized During Treatment: Gait belt;Rolling walker (2 wheels)  OT Visit Diagnosis: Unsteadiness on feet  (R26.81);Other abnormalities of gait and mobility (R26.89);Muscle weakness (generalized) (M62.81);Pain Pain - Right/Left: Left Pain - part of body: Leg   Activity Tolerance Patient tolerated treatment well   Patient Left in chair;with call bell/phone within reach;with chair alarm set   Nurse Communication Mobility status        Time: 0934-1000 OT Time Calculation (min): 26 min  Charges: OT Treatments $Self Care/Home Management : 23-37 mins  Alfonse Flavors, OTA Acute Rehabilitation Services  Office [phone]   Dewain Penning 04/12/2023, 12:03 PM

## 2023-04-12 NOTE — Progress Notes (Addendum)
 Physical Therapy Treatment Patient Details Name: Linda [name] MRN: [id number] DOB: 07-Nov-1970 Today's Date: 04/12/2023   History of Present Illness Pt is a 53 year old femal admitted with ostemyelitis and non-healing left 4th toe wound.  Underwent fourth toe amputaion on 3/16 and achilles tendon lengthening.   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    PT Comments  Pt admitted with above diagnosis. Pt needing +2 min assist for transfers sit to stand and pivot transfers with RW. Pt having diffculty sequencing RW and steps for transfer and for ambulation.  Ended session with practicing transfers squat pivot without RW and pt was CGA and much safer. She reports she does have a rail on her bed which will help her.  She states that her friend will come stay with her a week.  Continued to talk with pt about her need for post acute rehab < 3 hours day for her safety however pt adamant she is going home.  Reiterated to pt that she will need to use wheelchair in the home.   Pt having difficulty processing cues and information.  Could not tell this PT steps to transfer to wheelchair safely. This makes pt have poor safety overall. Pt currently with functional limitations due to the deficits listed below (see PT Problem List). Pt will benefit from acute skilled PT to increase their independence and safety with mobility to allow discharge.       If plan is discharge home, recommend the following: A lot of help with walking and/or transfers;A lot of help with bathing/dressing/bathroom   Can travel by private vehicle     No  Equipment Recommendations  Rolling walker (2 wheels);Wheelchair (measurements PT);Wheelchair cushion (measurements PT) (drop arm 3N1)    Recommendations for Other Services       Precautions / Restrictions Precautions Precautions: Fall Recall of  Precautions/Restrictions: Intact Restrictions Weight Bearing Restrictions Per Provider Order: Yes LLE Weight Bearing Per Provider Order: Non weight bearing     Mobility  Bed Mobility               General bed mobility comments: Pt in recliner. Partially slid down in recliner and appeared unaware.    Transfers Overall transfer level: Needs assistance Equipment used: Rolling walker (2 wheels) Transfers: Sit to/from Stand, Bed to chair/wheelchair/BSC Sit to Stand: Min assist, +2 safety/equipment   Step pivot transfers: Min assist, +2 physical assistance Squat pivot transfers: Contact guard assist     General transfer comment: cues for hand placement and min assist for transfer to bed from recliner.  Assist to power up as well as multiple cues and assist to pivot as pt appeared confused once up and couldnt problem solve how to move RW and to hop.  Pt was able to maintain NWB left LE with cues. Moved chair and practiced a bed to chair transfer again with RW and pt continued to struggle needing cues for safety and assist as well. Pt tends to sit too quickly before getting lined up with chair.  Moved RW and last transfer to bed from recliner had pt squat pivot and pt did better with this needing CGA.  Encouraged pt to not use RW at home if she didnt have assist. Encouraged use of wheelchair if she is alone. Pt agreed. Pt having difficulty processing cues and information.  Could not tell this PT steps to transfer to wheelchair safely.  Ambulation/Gait Ambulation/Gait assistance: Min assist, +2 physical assistance (second person present for chair follow) Gait Distance (Feet): 3 Feet Assistive device: Rolling walker (2 wheels) Gait Pattern/deviations: Decreased step length - right, Trunk flexed Gait velocity: decreased Gait velocity interpretation: <1.31 ft/sec, indicative of household ambulator   General Gait Details: Difficulty with "hop-steps" secondary to bil UE weakness, pt able  to accept full weight onto RW for R foot advancement.  Fatigues quickly.   Stairs             Wheelchair Mobility     Tilt Bed    Modified Rankin (Stroke Patients Only)       Balance Overall balance assessment: Needs assistance Sitting-balance support: Feet supported Sitting balance-Leahy Scale: Fair     Standing balance support: Bilateral upper extremity supported, Reliant on assistive device for balance Standing balance-Leahy Scale: Poor Standing balance comment: Needs BUE support to maintain NWBing on the LLE.                            Communication Communication Communication: No apparent difficulties  Cognition Arousal: Alert Behavior During Therapy: WFL for tasks assessed/performed   PT - Cognitive impairments: No apparent impairments                         Following commands: Intact      Cueing Cueing Techniques: Verbal cues  Exercises General Exercises - Lower Extremity Long Arc Quad: AROM, Both, 10 reps, Seated Other Exercises Other Exercises: Chair pushups with RLE assist x10    General Comments General comments (skin integrity, edema, etc.): Discussed possible needs for DME is she needs to go home; [facility] and tub bench      Pertinent Vitals/Pain Pain Assessment Pain Assessment: Faces Faces Pain Scale: Hurts little more Pain Location: LLE Pain Descriptors / Indicators: Discomfort Pain Intervention(s): Limited activity within patient's tolerance, Monitored during session, Repositioned    Home Living                          Prior Function            PT Goals (current goals can now be found in the care plan section) Acute Rehab PT Goals Patient Stated Goal: To be able to manage safely at home Progress towards PT goals: Not progressing toward goals - comment (Pt with poor safety awareness and difficulty with progression of mobility.)    Frequency    Min 2X/week      PT Plan      Co-evaluation               AM-PAC PT "6 Clicks" Mobility   Outcome Measure  Help needed turning from your back to your side while in a flat bed without using bedrails?: None Help needed moving from lying on your back to sitting on the side of a flat bed without using bedrails?: None Help needed moving to and from a bed to a chair (including a wheelchair)?: Total Help needed standing up from a chair using your arms (e.g., wheelchair or bedside chair)?: Total Help needed to walk in hospital room?: Total Help needed climbing 3-5 steps with a railing? : Total 6 Click Score: 12    End of Session Equipment Utilized During Treatment: Gait belt Activity Tolerance: Patient limited by fatigue Patient left: with call bell/phone within reach;in bed;with bed alarm set Nurse Communication: Mobility status  PT Visit Diagnosis: Unsteadiness on feet (R26.81);Muscle weakness (generalized) (M62.81);Pain Pain - Right/Left: Left Pain - part of body: Ankle and joints of foot     Time: 1208-1231 PT Time Calculation (min) (ACUTE ONLY): 23 min  Charges:    $Therapeutic Exercise: 8-22 mins $Therapeutic Activity: 8-22 mins PT General Charges $$ ACUTE PT VISIT: 1 Visit                     Linda [name],PT Acute Rehab Services [phone]    Linda [name] 04/12/2023, 1:25 PM

## 2023-04-12 NOTE — TOC Initial Note (Addendum)
 Transition of Care (TOC) - Initial/Assessment Note   Spoke to patient at bedside. Patient understands recommendation is for SNF at discharge. Patient prefers to go home at discharge. She spoke to her best friend last evening and friend can stay with her for a week and assist.   She is currently active with [facility] for [facility]. Patient wants to continue with [facility] . NCM spoke with Kandee Keen with [facility] they will need home health orders for RN,PT,OT and face to face. NCM entered and asked MD to sign.   PT recommending wheelchair , 3 in1 and walker. Patient in agreement. Most insurances do not cover wheelchair and walker at the same time . NCM will order both [employer] will discuss coverage with patient directly. Patient voiced understanding and thinks she can borrow a walker if insurance does not cover. NCM entered orders and DME note and asked MD to sign.   Jerolyn Center with [employer] for DME. OT requesting drop arm bedside commode . Order changed called Zach   Patient's neighbor will provide transportation home at discharge  Patient Details  Name: Linda [name] MRN: [id number] Date of Birth: 16-Mar-1970  Transition of Care [facility]) CM/SW Contact:    Kingsley Plan, RN Phone Number: 04/12/2023, 11:45 AM  Clinical Narrative:                   Expected Discharge Plan: Home w Home Health Services Barriers to Discharge: Continued Medical Work up   Patient Goals and CMS Choice Patient states their goals for this hospitalization and ongoing recovery are:: to return to home CMS Medicare.gov Compare Post Acute Care list provided to:: Patient Choice offered to / list presented to : Patient      Expected Discharge Plan and Services   Discharge Planning Services: CM Consult Post Acute Care Choice: Home Health, Durable Medical Equipment Living arrangements for the past 2 months: Apartment                 DME Arranged: 3-N-1, Walker rolling, Wheelchair manual DME Agency: [facility] Date DME Agency  Contacted: 04/12/23 Time DME Agency Contacted: 1144 Representative spoke with at DME Agency: Ian Malkin HH Arranged: PT, OT, RN HH Agency: [facility] Date [facility] Agency Contacted: 04/12/23 Time HH Agency Contacted: 1145 Representative spoke with at [facility] Agency: Kandee Keen  Prior Living Arrangements/Services Living arrangements for the past 2 months: Apartment Lives with:: Self Patient language and need for interpreter reviewed:: Yes Do you feel safe going back to the place where you live?: Yes      Need for Family Participation in Patient Care: Yes (Comment) Care giver support system in place?: Yes (comment)   Criminal Activity/Legal Involvement Pertinent to Current Situation/Hospitalization: No - Comment as needed  Activities of Daily Living   ADL Screening (condition at time of admission) Independently performs ADLs?: Yes (appropriate for developmental age) Is the patient deaf or have difficulty hearing?: No Does the patient have difficulty seeing, even when wearing glasses/contacts?: No Does the patient have difficulty concentrating, remembering, or making decisions?: No  Permission Sought/Granted   Permission granted to share information with : Yes, Verbal Permission Granted     Permission granted to share info w AGENCY: [employer] [facility] and [facility]        Emotional Assessment Appearance:: Appears stated age Attitude/Demeanor/Rapport: Engaged Affect (typically observed): Appropriate Orientation: : Oriented to Self, Oriented to Place, Oriented to  Time, Oriented to Situation Alcohol / Substance Use: Not Applicable Psych Involvement: No (comment)  Admission  diagnosis:  Diabetic foot (HCC) [E11.8] Visit for wound check [Z51.89] Patient Active Problem List   Diagnosis Date Noted   Diabetic foot (HCC) 04/07/2023   Bilateral leg cramps 03/31/2023   Skin yeast infection 03/31/2023   Post herpetic neuralgia 01/24/2023   Sinus tachycardia 01/06/2023   Encounter for immunization  10/13/2022   Anemia 09/21/2022    Class: Chronic   Thrombocytopenia (HCC) 09/21/2022    Class: Chronic   Difficulty urinating 09/18/2022   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   Muscle spasms of lower extremity, unspecified laterality 09/17/2022   RLS (restless legs syndrome) 09/17/2022   Chronic cough 06/22/2022   Adjustment insomnia 05/29/2022   Gross hematuria 05/29/2022   Dizziness 05/29/2022   Pre-operative examination 03/03/2022   Pulmonary nodule 12/30/2021   Back pain of lumbar region with sciatica 10/01/2021   Erythema of foot 10/01/2021   Right hip pain 06/27/2021   Left hip pain 06/27/2021   Falls frequently 06/23/2021   Essential hypertension, benign 03/23/2021   Type 2 diabetes mellitus with hyperglycemia (HCC) 01/20/2021   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   Binge eating disorder 11/23/2020   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 04/17/2020   Osteomyelitis of toe of right foot (HCC) 04/17/2020   Idiopathic thrombocytopenic purpura (ITP) (HCC) 01/27/2020   Malignant neoplasm of lower-inner quadrant of right breast of female, estrogen receptor negative (HCC) 10/01/2019   Chronic pain syndrome 07/08/2019   Uncomplicated opioid dependence (HCC) 07/08/2019   Mixed hyperlipidemia 04/11/2019   Neuropathy due to secondary diabetes mellitus (HCC) 04/11/2019   Acquired hypothyroidism 04/11/2019   Vitamin D insufficiency 04/11/2019   Class 2 severe obesity due to excess calories with serious comorbidity and body mass index (BMI) of 38.0 to 38.9 in adult (HCC) 04/11/2019   Liver cirrhosis secondary to NASH (nonalcoholic steatohepatitis) (HCC) 09/13/2018    Class: Chronic   BRCA1 positive 06/14/2016   PCP:  [name], MD Pharmacy:   [facility] - [place], [place] - [address] [place] [address] Phone: [phone] Fax: [phone]     Social Drivers of Health (SDOH) Social History: SDOH Screenings   Food Insecurity: Food  Insecurity Present (04/08/2023)  Housing: Low Risk  (04/08/2023)  Transportation Needs: No Transportation Needs (04/08/2023)  Utilities: Not At Risk (04/08/2023)  Alcohol Screen: Low Risk  (03/01/2022)  Depression (PHQ2-9): Medium Risk (01/24/2023)  Financial Resource Strain: High Risk (01/21/2023)  Physical Activity: Inactive (01/21/2023)  Social Connections: Moderately Isolated (01/21/2023)  Stress: Stress Concern Present (01/21/2023)  Tobacco Use: Low Risk  (04/09/2023)  Health Literacy: Adequate Health Literacy (09/22/2022)   SDOH Interventions:     Readmission Risk Interventions     No data to display

## 2023-04-12 NOTE — TOC Progression Note (Signed)
 Transition of Care [facility]) - Progression Note    Patient Details  Name: Linda [name] MRN: [id number] Date of Birth: [birth year]  Transition of Care [facility]) CM/SW Contact  Erin Sons, [place] Phone Number: 04/12/2023, 2:18 PM  Clinical Narrative:     CSW met with pt and provided SNF bed offer. She states she would like to return home with [facility] instead of SNF. Pt lives alone at a senior living complex. She has a rollator at home. She states she would borrow a walker from a neighbor but would like a 3in1 and a wheelchair. She states she has an Charity fundraiser already coming to the home with [facility]. She states her neighbor would be able to provide transportation at DC. RNCM updated.   Expected Discharge Plan: Home w Home Health Services Barriers to Discharge: Continued Medical Work up  Expected Discharge Plan and Services   Discharge Planning Services: CM Consult Post Acute Care Choice: Home Health, Durable Medical Equipment Living arrangements for the past 2 months: Apartment                 DME Arranged: 3-N-1, Walker rolling, Wheelchair manual DME Agency: [employer] Date DME Agency Contacted: 04/12/23 Time DME Agency Contacted: 1144 Representative spoke with at DME Agency: Ian Malkin HH Arranged: PT, OT, RN HH Agency: [facility] Date [facility] Agency Contacted: 04/12/23 Time HH Agency Contacted: 1145 Representative spoke with at [facility] Agency: Kandee Keen   Social Determinants of Health (SDOH) Interventions SDOH Screenings   Food Insecurity: Food Insecurity Present (04/08/2023)  Housing: Low Risk  (04/08/2023)  Transportation Needs: No Transportation Needs (04/08/2023)  Utilities: Not At Risk (04/08/2023)  Alcohol Screen: Low Risk  (03/01/2022)  Depression (PHQ2-9): Medium Risk (01/24/2023)  Financial Resource Strain: High Risk (01/21/2023)  Physical Activity: Inactive (01/21/2023)  Social Connections: Moderately Isolated (01/21/2023)  Stress: Stress Concern Present (01/21/2023)  Tobacco Use: Low  Risk  (04/09/2023)  Health Literacy: Adequate Health Literacy (09/22/2022)    Readmission Risk Interventions     No data to display

## 2023-04-12 NOTE — Hospital Course (Signed)
 53 year old with history of hypertension, hyperlipidemia, type 2 diabetes on insulin, neuropathy, previous multiple toe amputations due to diabetic foot ulcer sent from podiatry office for nonhealing left third toe wound.  Admitted.  Found to have osteomyelitis.  On antibiotics.  Underwent surgical amputation.

## 2023-04-12 NOTE — Progress Notes (Signed)
 DISCHARGE NOTE HOME Linda [name] to be discharged Home per MD order. Discussed prescriptions and follow up appointments with the patient. Prescriptions given to patient; medication list explained in detail. Patient verbalized understanding.  Skin clean, dry and intact without evidence of skin break down, no evidence of skin tears noted. IV catheter discontinued intact. Site without signs and symptoms of complications. Dressing and pressure applied. Pt denies pain at the site currently. No complaints noted.  Patient free of lines, drains, and wounds pther than noted on LDA  An After Visit Summary (AVS) was printed and given to the patient. Patient escorted via wheelchair, and discharged home via private auto.  Velia Meyer, RN

## 2023-04-12 NOTE — Discharge Summary (Signed)
 Physician Discharge Summary   Patient: Linda [name] MRN: [id number] DOB: August 08, 1970  Admit date:     04/07/2023  Discharge date: 04/12/23  Discharge Physician: Rickey Barbara   PCP: [name], MD   Recommendations at discharge:    Follow up with PCP in 1-2 weeks Follow up with Podiatry as will be schecduled  Discharge Diagnoses: Principal Problem:   Diabetic foot (HCC) Active Problems:   Liver cirrhosis secondary to NASH (nonalcoholic steatohepatitis) (HCC)   Anemia   Mixed hyperlipidemia   Neuropathy due to secondary diabetes mellitus (HCC)   Acquired hypothyroidism   Class 2 severe obesity due to excess calories with serious comorbidity and body mass index (BMI) of 38.0 to 38.9 in adult [facility])   Chronic pain syndrome   Malignant neoplasm of lower-inner quadrant of right breast of female, estrogen receptor negative (HCC)   Type 2 diabetes mellitus with hyperglycemia (HCC)   Essential hypertension, benign   Mild recurrent major depression (HCC)   RLS (restless legs syndrome)  Resolved Problems:   Severe episode of recurrent major depressive disorder, with psychotic features [facility])  Hospital Course: 53 year old with history of hypertension, hyperlipidemia, type 2 diabetes on insulin, neuropathy, previous multiple toe amputations due to diabetic foot ulcer sent from podiatry office for nonhealing left third toe wound.  Admitted.  Found to have osteomyelitis.  On antibiotics.  Underwent surgical amputation.   Assessment and Plan: Diabetic foot infection, failed outpatient therapy.  MRI consistent with osteomyelitis of the third toe. Status post left-sided TMA 3/16.  Surgical cultures with rare Klebsiella and Enterococcus.  Blood cultures negative  Nonweightbearing.  Incision intact.  Reported clean surgical margins. Received various antibiotics through IV for the last 3 days. Narrowed to Levaquin to complete 7 days.  Patient is allergy to penicillin and Augmentin. Seen  by PT/OT who recommended SNF, however pt preferred home with home health. Arranged   Type 2 diabetes: Well-controlled as per patient.  Recent A1c 6.7.  Blood sugars elevated now.  Pt had been on 60 units in hospital.  She is on Mounjaro at home and she will resume that on discharge.   Chronic medical issues including Hypertension, stable Hyperlipidemia, on statin Neuropathy, on gabapentin Chronic anemia and thrombocytopenia secondary to cirrhosis, stable. Platelets are historically at baseline. Chronic pain syndrome, on gabapentin and morphine. Depression, on amitriptyline, Wellbutrin, BuSpar, Vraylar and Fetzima.  Continue.       Consultants: Podiatry Procedures performed: Procedures: Procedure:    LEFT TRANSMETATARSAL AMPUTATION CPT(R) Code:  69629 - PR AMPUTATION FOOT TRANSMETARSAL Procedure:    LENGTHENING, TENDON, ACHILLES  Disposition: Home Diet recommendation:  Carb modified diet DISCHARGE MEDICATION: Allergies as of 04/12/2023       Reactions   Codeine Shortness Of Breath, Other (See Comments)   Other reaction(s): SHOB   Augmentin [amoxicillin-pot Clavulanate] Diarrhea   Celebrex [celecoxib] Other (See Comments)   Unknown reaction   Propranolol Other (See Comments)   Other reaction(s): Unknown   Propranolol Hcl Other (See Comments)   Unknown reaction ask   Simvastatin Other (See Comments)   Other reaction(s): Unknown   Vytorin [ezetimibe-simvastatin] Other (See Comments)   Unknown reaction Patient is not aware of an allergy to this medication, ask   Zetia [ezetimibe] Other (See Comments)   Other reaction(s): Unknown        Medication List     STOP taking these medications    ferrous sulfate 325 (65 FE) MG tablet       TAKE these  medications    acetaminophen 500 MG tablet Commonly known as: TYLENOL Take 500 mg by mouth every 6 (six) hours as needed for moderate pain or mild pain.   albuterol 108 (90 Base) MCG/ACT inhaler Commonly known as:  VENTOLIN HFA INHALE TWO PUFFS BY MOUTH INTO LUNGS every SIX hours orn FOR WHEEZING AND/OR SHORTNESS OF BREATH   amitriptyline 25 MG tablet Commonly known as: ELAVIL TAKE ONE TABLET BY MOUTH AT BEDTIME   amoxicillin-clavulanate 875-125 MG tablet Commonly known as: AUGMENTIN Take 1 tablet by mouth every 12 (twelve) hours for 6 days.   atorvastatin 10 MG tablet Commonly known as: LIPITOR TAKE ONE TABLET BY MOUTH EVERY EVENING   BD Pen Needle Nano U/F 32G X 4 MM Misc Generic drug: Insulin Pen Needle Use new needle with each injection.   buPROPion 300 MG 24 hr tablet Commonly known as: WELLBUTRIN XL Take 1 tablet (300 mg total) by mouth every morning.   busPIRone 5 MG tablet Commonly known as: BUSPAR TAKE ONE TABLET BY MOUTH 3 TIMES DAILY   cyclobenzaprine 10 MG tablet Commonly known as: FLEXERIL TAKE ONE TABLET BY MOUTH EVERY 8 HOURS AS NEEDED FOR MUSCLE SPASMS   dicyclomine 20 MG tablet Commonly known as: BENTYL TAKE ONE TABLET BY MOUTH BEFORE MEALS AND AT BEDTIME AS NEEDED FOR STOMACH CRAMPING   doxycycline 100 MG tablet Commonly known as: VIBRA-TABS Take 1 tablet (100 mg total) by mouth 2 (two) times daily for 10 days.   Fetzima 80 MG Cp24 Generic drug: Levomilnacipran HCl ER TAKE ONE CAPSULE BY MOUTH EVERY EVENING   FreeStyle Libre 2 Reader [facility] E11.69 Check blood sugar 4 times daily as directed   FreeStyle Libre 3 Plus Sensor Misc Change sensor every 15 days.   gabapentin 600 MG tablet Commonly known as: Neurontin Take 1 tablet (600 mg total) by mouth 3 (three) times daily for 14 days. What changed: Another medication with the same name was removed. Continue taking this medication, and follow the directions you see here.   glucose blood test strip Use as instructed to check FBS daily. E11.40   levothyroxine 75 MCG tablet Commonly known as: SYNTHROID Take 1 tablet (75 mcg total) by mouth daily before breakfast.   losartan 50 MG tablet Commonly known as:  COZAAR TAKE ONE TABLET BY MOUTH EVERY EVENING   Magnesium 500 MG Caps Take 1 capsule (500 mg total) by mouth daily.   metoprolol succinate 25 MG 24 hr tablet Commonly known as: TOPROL-XL Take 0.5 tablets (12.5 mg total) by mouth daily.   morphine 30 MG 12 hr tablet Commonly known as: MS CONTIN Take 1 tablet (30 mg total) by mouth every 12 (twelve) hours.   Mounjaro 7.5 MG/0.5ML Pen Generic drug: tirzepatide INJECT 7.5 MG INTO THE SKIN EVERY WEEK   multivitamin with minerals Tabs tablet Take 1 tablet by mouth daily. Solar ray   nystatin powder Commonly known as: MYCOSTATIN/NYSTOP Apply 1 Application topically 3 (three) times daily.   ondansetron 4 MG tablet Commonly known as: ZOFRAN Take 1 tablet (4 mg total) by mouth every 4 (four) hours as needed for nausea.   OneTouch Delica Plus Lancet30G Misc USE TO check blood glucose 2-3 times daily AS DIRECTED   OneTouch Verio Reflect w/Device Kit AS DIRECTED   OVER THE COUNTER MEDICATION Take 1 tablet by mouth in the morning and at bedtime. Lutein for eyes   oxyCODONE 5 MG immediate release tablet Commonly known as: Oxy IR/ROXICODONE Take 1 tablet (5 mg total)  by mouth every 4 (four) hours as needed for moderate pain (pain score 4-6).   pantoprazole 40 MG tablet Commonly known as: PROTONIX TAKE ONE TABLET BY MOUTH TWICE DAILY   polyethylene glycol 17 g packet Commonly known as: MIRALAX / GLYCOLAX Take 17 g by mouth daily as needed for moderate constipation.   potassium chloride 10 MEQ CR capsule Commonly known as: MICRO-K Take 2 capsules (20 mEq total) by mouth 2 (two) times daily.   PROBIOTIC PO Take 1 capsule by mouth in the morning. 3.2 billion cfu   prochlorperazine 5 MG tablet Commonly known as: COMPAZINE TAKE ONE TABLET BY MOUTH every SIX hours AS NEEDED FOR NAUSEA AND VOMITING   ramelteon 8 MG tablet Commonly known as: ROZEREM TAKE ONE TABLET BY MOUTH AT BEDTIME   rOPINIRole 0.25 MG tablet Commonly  known as: REQUIP TAKE ONE TABLET BY MOUTH AT BEDTIME   Sphygmomanometer Misc 1 each by Does not apply route daily in the afternoon.   Synjardy 12.05-998 MG Tabs Generic drug: Empagliflozin-metFORMIN HCl TAKE ONE TABLET BY MOUTH TWICE DAILY   tolnaftate 1 % powder Commonly known as: TINACTIN Apply 1 Application topically 2 (two) times daily for 14 days.   Evaristo Bury FlexTouch 200 UNIT/ML FlexTouch Pen Generic drug: insulin degludec INJECT 60 UNITS SUBCUTANEOUSLY DAILY   Vitamin D (Ergocalciferol) 1.25 MG (50000 UNIT) Caps capsule Commonly known as: DRISDOL TAKE ONE CAPSULE BY MOUTH EVERY FRIDAY AT 8AM   Vraylar 3 MG capsule Generic drug: cariprazine TAKE ONE CAPSULE BY MOUTH EVERY DAY   Vyvanse 70 MG capsule Generic drug: lisdexamfetamine TAKE ONE CAPSULE BY MOUTH EVERY DAY               Durable Medical Equipment  (From admission, onward)           Start     Ordered   04/12/23 1352  For home use only DME 3 n 1  Once       Comments: Drop arm   04/12/23 1351   04/12/23 1141  For home use only DME Walker rolling  Once       Question Answer Comment  Walker: With 5 Inch Wheels   Patient needs a walker to treat with the following condition Weakness      04/12/23 1141   04/12/23 1141  For home use only DME standard manual wheelchair with seat cushion  Once       Comments: Patient suffers from Left foot trans metatarsal amputaiton, tendo achilles lengthening which impairs their ability to perform daily activities like ambulating  in the home.  A cane  will not resolve issue with performing activities of daily living. A wheelchair will allow patient to safely perform daily activities. Patient can safely propel the wheelchair in the home or has a caregiver who can provide assistance. Length of need lifetime . Accessories: elevating leg rests (ELRs), wheel locks, extensions and anti-tippers.  Seat and back cushions   04/12/23 1141            Follow-up Information      [name], Kirsten, MD Follow up in 2 week(s).   Specialty: Family Medicine Why: Hospital follow up Contact information: [address] [place] [address] [phone]         Barbaraann Share, DPM Follow up.   Specialty: Podiatry Why: Hospital follow up, follow up as will be scheduled Contact information: [address] [place] [address] [phone]  Discharge Exam: Filed Weights   04/08/23 1057  Weight: 93.9 kg   General exam: Awake, laying in bed, in nad Respiratory system: Normal respiratory effort, no wheezing Cardiovascular system: regular rate, s1, s2 Gastrointestinal system: Soft, nondistended, positive BS Central nervous system: CN2-12 grossly intact, strength intact Extremities: Perfused, no clubbing Skin: Normal skin turgor, no notable skin lesions seen Psychiatry: Mood normal // no visual hallucinations   Condition at discharge: fair  The results of significant diagnostics from this hospitalization (including imaging, microbiology, ancillary and laboratory) are listed below for reference.   Imaging Studies: VAS [name] ABI WITH/WO TBI Result Date: 04/10/2023  LOWER EXTREMITY DOPPLER STUDY Patient Name:  Bassy Fetterly  Date of Exam:   04/10/2023 Medical Rec #: [id number]              Accession #:    [id number] Date of Birth: [birth year]              Patient Gender: F Patient Age:   2 years Exam [facility]  [facility] Procedure:      VAS [name] ABI WITH/WO TBI Referring Phys: Leta Jungling SEMON --------------------------------------------------------------------------------  Indications: Ulceration. High Risk Factors: Hypertension, hyperlipidemia, Diabetes. Other Factors: Prior RT toe amputations.  Vascular Interventions: Left transmetatarsal amputation 04/11/23. Limitations: Today's exam was limited due to bandages and an open wound. Comparison Study: None. Performing Technologist: Shona Simpson  Examination Guidelines: A  complete evaluation includes at minimum, Doppler waveform signals and systolic blood pressure reading at the level of bilateral brachial, anterior tibial, and posterior tibial arteries, when vessel segments are accessible. Bilateral testing is considered an integral part of a complete examination. Photoelectric Plethysmograph (PPG) waveforms and toe systolic pressure readings are included as required and additional duplex testing as needed. Limited examinations for reoccurring indications may be performed as noted.  ABI Findings: +---------+------------------+-----+---------+----------+ Right    Rt Pressure (mmHg)IndexWaveform Comment    +---------+------------------+-----+---------+----------+ Brachial 118                    triphasic           +---------+------------------+-----+---------+----------+ PTA      128               1.08 biphasic            +---------+------------------+-----+---------+----------+ DP       136               1.15 triphasic           +---------+------------------+-----+---------+----------+ Great Toe                                Amputation +---------+------------------+-----+---------+----------+ +--------+------------------+-----+----------+-------+ Left    Lt Pressure (mmHg)IndexWaveform  Comment +--------+------------------+-----+----------+-------+ [id number]                    monophasic        +--------+------------------+-----+----------+-------+ PTA                                      N/A     +--------+------------------+-----+----------+-------+ DP                                       N/A     +--------+------------------+-----+----------+-------+ +-------+-----------+-----------+------------+------------+ ABI/TBIToday's ABIToday's TBIPrevious ABIPrevious  TBI +-------+-----------+-----------+------------+------------+ Right  1.15       N/A                                  +-------+-----------+-----------+------------+------------+ Left   N/A        N/A                                 +-------+-----------+-----------+------------+------------+  Summary: Right: Resting right ankle-brachial index is within normal range. Left: Unable to assess due to bandaging from surgery the previous day. *See table(s) above for measurements and observations.  Electronically signed by Gerarda Fraction on 04/10/2023 at 5:30:49 PM.    Final    DG Foot Complete Left Result Date: 04/09/2023 CLINICAL DATA:  Status post transmetatarsal amputation of left foot. EXAM: LEFT FOOT - COMPLETE 3+ VIEW COMPARISON:  Left foot radiographs 04/03/2023, MRI left forefoot 04/07/2023 FINDINGS: Interval first through fifth ray amputation to the proximal metatarsal shaft. Amputation margins are sharp. There are distal soft tissues surgical skin staples. Mild plantar and posterior calcaneal heel spurs. No subcutaneous air. No acute fracture or dislocation. IMPRESSION: Interval first through fifth ray amputation to the proximal metatarsal shaft. Electronically Signed   By: Neita Garnet M.D.   On: 04/09/2023 10:07   MR FOOT LEFT WO CONTRAST Result Date: 04/07/2023 CLINICAL DATA:  Osteonecrosis suspected, foot, xray done EXAM: MRI OF THE LEFT FOOT WITHOUT CONTRAST TECHNIQUE: Multiplanar, multisequence MR imaging of the left forefoot was performed. No intravenous contrast was administered. COMPARISON:  X-ray 04/03/2023 FINDINGS: Bones/Joint/Cartilage Bone marrow edema and confluent low T1 marrow signal changes throughout the proximal, middle, and distal phalanx of the fourth toe, compatible with acute osteomyelitis. Trace fourth MTP joint effusion. No convincing marrow edema within the fourth metatarsal head. Postsurgical changes to the left foot including prior great toe amputations and partial amputations of the second and third toes. No fracture or dislocation. Mild degenerative changes. Ligaments Intact Lisfranc  ligament.  No collateral ligament injury. Muscles and Tendons Denervation changes of the foot musculature and prior amputation changes distally. No tenosynovitis. Soft tissues Soft tissue swelling and edema of the fourth toe. No organized or drainable fluid collections. IMPRESSION: 1. Acute osteomyelitis of the fourth toe involving the proximal, middle, and distal phalanx. 2. Trace fourth MTP joint effusion, which may be reactive or represent septic arthritis. Electronically Signed   By: Duanne Guess D.O.   On: 04/07/2023 19:51   DG Foot Complete Left Result Date: 04/03/2023 Please see detailed radiograph report in office note.  DG Foot Complete Right Result Date: 04/03/2023 Please see detailed radiograph report in office note.   Microbiology: Results for orders placed or performed during the hospital encounter of 04/07/23  Surgical pcr screen     Status: None   Collection Time: 04/09/23  6:12 AM   Specimen: Nasal Mucosa; Nasal Swab  Result Value Ref Range Status   MRSA, PCR NEGATIVE NEGATIVE Final   Staphylococcus aureus NEGATIVE NEGATIVE Final    Comment: (NOTE) The Xpert SA Assay (FDA approved for NASAL specimens in patients 77 years of age and older), is one component of a comprehensive surveillance program. It is not intended to diagnose infection nor to guide or monitor treatment. Performed at [facility], [address], [place], [place] [address]   Aerobic/Anaerobic Culture w Gram Stain (surgical/deep wound)  Status: None (Preliminary result)   Collection Time: 04/09/23  8:27 AM   Specimen: PATH Amputaion Arm/Leg; Tissue  Result Value Ref Range Status   Specimen Description TISSUE  Final   Special Requests 4th MPJ  Final   Gram Stain   Final    RARE WBC SEEN NO ORGANISMS SEEN Performed at [facility], [address], [place], [place] [address]    Culture   Final    RARE KLEBSIELLA PNEUMONIAE RARE ENTEROCOCCUS FAECALIS NO ANAEROBES ISOLATED; CULTURE  IN PROGRESS FOR 5 DAYS    Report Status PENDING  Incomplete   Organism ID, Bacteria KLEBSIELLA PNEUMONIAE  Final   Organism ID, Bacteria ENTEROCOCCUS FAECALIS  Final      Susceptibility   Enterococcus faecalis - MIC*    AMPICILLIN <=2 SENSITIVE Sensitive     VANCOMYCIN 1 SENSITIVE Sensitive     GENTAMICIN SYNERGY SENSITIVE Sensitive     * RARE ENTEROCOCCUS FAECALIS   Klebsiella pneumoniae - MIC*    AMPICILLIN >=32 RESISTANT Resistant     CEFEPIME <=0.12 SENSITIVE Sensitive     CEFTAZIDIME <=1 SENSITIVE Sensitive     CEFTRIAXONE <=0.25 SENSITIVE Sensitive     CIPROFLOXACIN <=0.25 SENSITIVE Sensitive     GENTAMICIN <=1 SENSITIVE Sensitive     IMIPENEM 0.5 SENSITIVE Sensitive     TRIMETH/SULFA <=20 SENSITIVE Sensitive     AMPICILLIN/SULBACTAM 8 SENSITIVE Sensitive     PIP/TAZO <=4 SENSITIVE Sensitive ug/mL    * RARE KLEBSIELLA PNEUMONIAE    Labs: CBC: Recent Labs  Lab 04/07/23 1652 04/08/23 0751 04/12/23 0347  WBC 6.8 3.0* 4.8  NEUTROABS 4.2  --  3.1  HGB 12.8 10.4* 10.1*  HCT 41.5 33.6* 32.5*  MCV 83.7 83.8 85.8  PLT 99* 64* 64*   Basic Metabolic Panel: Recent Labs  Lab 04/07/23 1652 04/08/23 0751 04/10/23 0450 04/12/23 0347  NA 140 139 139 137  K 4.1 3.7 3.3* 3.7  CL 108 108 107 105  CO2 22 23 23 25   GLUCOSE 104* 180* 149* 206*  BUN 9 8 6 11   CREATININE 1.42* 0.96 0.91 0.92  CALCIUM 8.8* 7.9* 8.4* 8.2*  MG  --   --   --  1.5*   Liver Function Tests: Recent Labs  Lab 04/07/23 2129 04/08/23 0751 04/12/23 0347  AST 20 17 15   ALT 13 12 13   ALKPHOS 73 62 51  BILITOT 0.6 0.5 0.5  PROT 6.2* 5.3* 5.5*  ALBUMIN 3.2* 2.6* 2.6*   CBG: Recent Labs  Lab 04/11/23 1131 04/11/23 1644 04/11/23 2040 04/12/23 0755 04/12/23 1151  GLUCAP 211* 200* 244* 177* 237*    Discharge time spent: less than 30 minutes.  Signed: Rickey Barbara, MD [facility] 04/12/2023

## 2023-04-12 NOTE — TOC CM/SW Note (Signed)
    Durable Medical Equipment  (From admission, onward)           Start     Ordered   04/12/23 1141  For home use only DME Walker rolling  Once       Question Answer Comment  Walker: With 5 Inch Wheels   Patient needs a walker to treat with the following condition Weakness      04/12/23 1141   04/12/23 1141  For home use only DME 3 n 1  Once        04/12/23 1141   04/12/23 1141  For home use only DME standard manual wheelchair with seat cushion  Once       Comments: Patient suffers from Left foot trans metatarsal amputaiton, tendo achilles lengthening which impairs their ability to perform daily activities like ambulating  in the home.  A cane  will not resolve issue with performing activities of daily living. A wheelchair will allow patient to safely perform daily activities. Patient can safely propel the wheelchair in the home or has a caregiver who can provide assistance. Length of need lifetime . Accessories: elevating leg rests (ELRs), wheel locks, extensions and anti-tippers.  Seat and back cushions   04/12/23 1141           Patient confined to one room at home without a bathroom therefore needs a 3 in 1 / bedside commode

## 2023-04-13 ENCOUNTER — Telehealth: Payer: Self-pay

## 2023-04-13 ENCOUNTER — Telehealth: Payer: Self-pay | Admitting: Podiatry

## 2023-04-13 NOTE — Transitions of Care (Post Inpatient/ED Visit) (Signed)
 04/13/2023  Name: Linda [name] MRN: [id number] DOB: [birth year]  Today's TOC FU Call Status: Today's TOC FU Call Status:: Successful TOC FU Call Completed TOC FU Call Complete Date: 04/13/23 Patient's Name and Date of Birth confirmed.  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 How have you been since you were released from the hospital?: Worse (patient reports she is dealing the after afffects of surgery) Any questions or concerns?: No (patient denies)  Items Reviewed: Did you receive and understand the discharge instructions provided?: Yes Any new allergies since your discharge?: No Dietary orders reviewed?: Yes Type of Diet Ordered:: diabetic diet Do you have support at home?: Yes People in Home: friend(s) Name of Support/Comfort Primary Source: patient states she has a friend who will be staying with her and some neighbors who help - states neighbors came over last night with food and helped  Medications Reviewed Today: Medications Reviewed Today     Reviewed by Jessy Oto, RN (Registered Nurse) on 04/13/23 at 1001  Med List Status: <None>   Medication Order Taking? Sig Documenting Provider Last Dose Status Informant  acetaminophen (TYLENOL) 500 MG tablet [id number] Yes Take 500 mg by mouth every 6 (six) hours as needed for moderate pain or mild pain. [provider] Taking Active Self, Pharmacy Records  albuterol (VENTOLIN HFA) 108 (90 Base) MCG/ACT inhaler [id number] Yes INHALE TWO PUFFS BY MOUTH INTO LUNGS every SIX hours orn FOR WHEEZING AND/OR SHORTNESS OF Jari Favre, MD Taking Active Self, Pharmacy Records  amitriptyline (ELAVIL) 25 MG tablet [id number] Yes TAKE ONE TABLET BY MOUTH AT BEDTIME [name], Kirsten, MD Taking Active Self, Pharmacy Records   amoxicillin-clavulanate (AUGMENTIN) 875-125 MG tablet [id number] Yes Take 1 tablet by mouth every 12 (twelve) hours for 6 days. Jerald Kief, MD Taking Active   atorvastatin (LIPITOR) 10 MG tablet [id number] Yes TAKE ONE TABLET BY MOUTH EVERY Colon Branch, MD Taking Active Self, Pharmacy Records  Blood Glucose Monitoring Suppl [facility] VERIO REFLECT) w/Device Andria Rhein [id number] Yes AS DIRECTED [provider] Taking Active Self, Pharmacy Records  Blood Pressure Monitoring [facility]) MISC [id number] Yes 1 each by Does not apply route daily in the afternoon. [name], Kirsten, MD Taking Active Self, Pharmacy Records  buPROPion (WELLBUTRIN XL) 300 MG 24 hr tablet [id number] Yes Take 1 tablet (300 mg total) by mouth every morning. [name], MD Taking Active Self, Pharmacy Records  busPIRone (BUSPAR) 5 MG tablet [id number] Yes TAKE ONE TABLET BY MOUTH 3 TIMES DAILY [name], Kirsten, MD Taking Active Self, Pharmacy Records  Continuous Blood Gluc Receiver (FREESTYLE LIBRE 2 READER) Hardie Pulley [id number] Yes E11.69 Check blood sugar 4 times daily as directed [name], [name], MD Taking Active Self, Pharmacy Records  Continuous Glucose Sensor (FREESTYLE LIBRE 3 PLUS SENSOR) [place] [id number] Yes Change sensor every 15 days. CoxFritzi Mandes, MD Taking Active Self, Pharmacy Records  cyclobenzaprine (FLEXERIL) 10 MG tablet [id number] Yes TAKE ONE TABLET BY MOUTH EVERY 8 HOURS AS NEEDED FOR MUSCLE SPASMS [name], Kirsten, MD Taking Active Self, Pharmacy Records  dicyclomine (BENTYL) 20 MG tablet [id number] Yes TAKE ONE TABLET BY MOUTH BEFORE MEALS AND AT BEDTIME AS NEEDED FOR STOMACH CRAMPING [name], Kirsten, MD Taking Active Self, Pharmacy Records  doxycycline (VIBRA-TABS) 100 MG tablet [id number] No Take 1 tablet (100 mg total) by mouth 2 (two) times daily for 10 days. Barbaraann Share, DPM Unknown Active  Self, Pharmacy Records           Med Note [facility], Josehua Hammar A   Thu Apr 13, 2023  9:47 AM) [facility] RN called Dr Lavell Anchors office  04/13/23 to see if MD wants patient taking this and the Augmentin.   FETZIMA 80 MG CP24 [id number] Yes TAKE ONE CAPSULE BY MOUTH EVERY EVENING [name], Kirsten, MD Taking Active Self, Pharmacy Records  gabapentin (NEURONTIN) 600 MG tablet [id number] Yes Take 1 tablet (600 mg total) by mouth 3 (three) times daily for 14 days. Renne Crigler, FNP Taking Active Self, Pharmacy Records           Med Note Lia Hopping, [facility] N   Sat Apr 08, 2023  9:15 AM) Pt states she normally takes the 600mg  tablets and the 400mg  tablets were just a recent short course but Per dispense report pt takes the 400mg  most often and the 600mg  were a recent short course  glucose blood test strip [id number] Yes Use as instructed to check FBS daily. E11.40 CoxFritzi Mandes, MD Taking Active Self, Pharmacy Records  Insulin Pen Needle (BD PEN NEEDLE NANO U/F) 32G X 4 MM MISC [id number] Yes Use new needle with each injection. [name], MD Taking Active Self, Pharmacy Records  Lancets [facility] [place] PLUS [place]) [place] [id number] Yes USE TO check blood glucose 2-3 times daily AS DIRECTED [name], Kirsten, MD Taking Active Self, Pharmacy Records  levothyroxine (SYNTHROID) 75 MCG tablet [id number] Yes Take 1 tablet (75 mcg total) by mouth daily before breakfast. [name], MD Taking Active Self, Pharmacy Records  losartan (COZAAR) 50 MG tablet [id number] Yes TAKE ONE TABLET BY MOUTH EVERY Colon Branch, MD Taking Active Self, Pharmacy Records  Magnesium 500 MG CAPS [id number] Yes Take 1 capsule (500 mg total) by mouth daily. [name], Kirsten, MD Taking Active Self, Pharmacy Records  metoprolol succinate (TOPROL-XL) 25 MG 24 hr tablet [id number] Yes Take 0.5 tablets (12.5 mg total) by mouth daily. [name], Kirsten, MD Taking Active Self, Pharmacy Records           Med Note Lia Hopping, [facility]   Sat Apr 08, 2023  9:16 AM) Pt states she takes half a tablet daily  morphine (MS CONTIN) 30 MG 12 hr tablet [id number] Yes Take 1 tablet (30 mg total) by mouth  every 12 (twelve) hours. [name], MD Taking Active Self, Pharmacy Records  MOUNJARO 7.5 MG/0.5ML Pen [id number] Yes INJECT 7.5 MG INTO THE SKIN EVERY WEEK [name], Kirsten, MD Taking Active Self, Pharmacy Records           Med Note Patience Musca   Sat Apr 08, 2023  9:04 AM) saturdays  Multiple Vitamin (MULTIVITAMIN WITH MINERALS) TABS tablet [id number] Yes Take 1 tablet by mouth daily. Solar ray [provider] Taking Active Self, Pharmacy Records           Med Note Lenoria Farrier   Tue Mar 01, 2022  4:02 PM)    nystatin (MYCOSTATIN/NYSTOP) powder [id number] Yes Apply 1 Application topically 3 (three) times daily. Renne Crigler, FNP Taking Active Self, Pharmacy Records  ondansetron [facility]) 4 MG tablet [id number] Yes Take 1 tablet (4 mg total) by mouth every 4 (four) hours as needed for nausea. [name], MD Taking Active Self, Pharmacy Records  OVER THE COUNTER MEDICATION [id number] Yes Take 1 tablet by mouth in the morning and at bedtime. Lutein for eyes [provider] Taking Active Self, Pharmacy Records  oxyCODONE (OXY IR/ROXICODONE) 5 MG immediate release tablet [id number]  Yes Take 1 tablet (5 mg total) by mouth every 4 (four) hours as needed for moderate pain (pain score 4-6). Jerald Kief, MD Taking Active   pantoprazole (PROTONIX) 40 MG tablet [id number] Yes TAKE ONE TABLET BY MOUTH TWICE DAILY [name], Kirsten, MD Taking Active Self, Pharmacy Records  polyethylene glycol (MIRALAX / GLYCOLAX) 17 g packet [id number] Yes Take 17 g by mouth daily as needed for moderate constipation. [provider] Taking Active Self, Pharmacy Records  potassium chloride (MICRO-K) 10 MEQ CR capsule [id number] Yes Take 2 capsules (20 mEq total) by mouth 2 (two) times daily. [name], MD Taking Active Self, Pharmacy Records  Probiotic Product (PROBIOTIC PO) [id number] Yes Take 1 capsule by mouth in the morning. 3.2 billion cfu [provider] Taking Active Self, Pharmacy  Records  prochlorperazine (COMPAZINE) 5 MG tablet [id number] Yes TAKE ONE TABLET BY MOUTH every SIX hours AS NEEDED FOR NAUSEA AND VOMITING [name], Kirsten, MD Taking Active Self, Pharmacy Records  ramelteon (ROZEREM) 8 MG tablet [id number] Yes TAKE ONE TABLET BY MOUTH AT BEDTIME [name], [name], MD Taking Active Self, Pharmacy Records  rOPINIRole (REQUIP) 0.25 MG tablet [id number] Yes TAKE ONE TABLET BY MOUTH AT BEDTIME [name], MD Taking Active Self, Pharmacy Records  [facility] 12.05-998 [facility] TABS [id number] Yes TAKE ONE TABLET BY MOUTH TWICE DAILY Marianne Sofia, PA-C Taking Active Self, Pharmacy Records  tolnaftate (TINACTIN) 1 % powder [id number] Yes Apply 1 Application topically 2 (two) times daily for 14 days. Barbaraann Share, DPM Taking Active Self, Pharmacy Records  TRESIBA FLEXTOUCH 200 UNIT/ML FlexTouch Pen [id number] Yes INJECT 60 UNITS SUBCUTANEOUSLY DAILY [name], Kirsten, MD Taking Active Self, Pharmacy Records  Vitamin D, Ergocalciferol, (DRISDOL) 1.25 MG (50000 UNIT) CAPS capsule [id number] Yes TAKE ONE CAPSULE BY MOUTH EVERY FRIDAY AT Foye Deer, MD Taking Active Self, Pharmacy Records  VRAYLAR 3 MG capsule [id number] Yes TAKE ONE CAPSULE BY MOUTH EVERY Abelardo Diesel, MD Taking Active Self, Pharmacy Records  VYVANSE 70 MG capsule [id number] Yes TAKE ONE CAPSULE BY MOUTH EVERY DAY [name], Kirsten, MD Taking Active Self, Pharmacy Records            Home Care and Equipment/Supplies: Were Home Health Services Ordered?: Yes Name of Home Health Agency:: [facility] Has Agency set up a time to come to your home?: No (Patient states she already had [facility] and plans to call her nurse today - denied [facility] to [facility]) EMR reviewed for Home Health Orders:  (Patient states she already had [facility] and plans to call her nurse today - denied need for [facility] RN to call) Any new equipment or medical supplies ordered?: Yes Name of Medical supply agency?: Patient reports she received 3in1 and  Wheelchair from hospital Were you able to get the equipment/medical supplies?: Yes Do you have any questions related to the use of the equipment/supplies?: No  Functional Questionnaire: Do you need assistance with bathing/showering or dressing?: No (patient states poditrist does not want her to get the dressing wet so she is doing sponge baths) Do you need assistance with meal preparation?: Yes (patient states herf friends and neighbors are going to help and she will call insurance to see if any support is available as well) Do you need assistance with eating?: No Do you have difficulty maintaining continence: No Do you need assistance with getting out of bed/getting out of a chair/moving?: No Do you have difficulty managing or taking your medications?: No (Patient states she utilizies pill  packs from phrarmacy and she manages new medications with no issue)  Follow up appointments reviewed: PCP Follow-up appointment confirmed?: No (TOC RN offered to schedule and patient said she would call) MD Provider Line Number:[phone] Given: No Specialist Hospital Follow-up appointment confirmed?: Yes Date of Specialist follow-up appointment?: 04/17/23 Follow-Up Specialty Provider:: Podiatry,Semon, Lawernce Pitts, DPM Do you need transportation to your follow-up appointment?: No (Patient states she as arranged transportation - will call her insurance to see if additional resources are available) Do you understand care options if your condition(s) worsen?: Yes-patient verbalized understanding  SDOH Interventions Today    Flowsheet Row Most Recent Value  SDOH Interventions   Food Insecurity Interventions Intervention Not Indicated  Housing Interventions Intervention Not Indicated  Transportation Interventions Intervention Not Indicated  Utilities Interventions Intervention Not Indicated       Goals Addressed               This Visit's Progress     TOC Care Plan - Patient will report no  readmissions in the next 30 days (pt-stated)        Current Barriers:  Patient with Left trans metatarsal amputation now in wheelchair and adjusting to non-weight bearing status  RNCM Clinical Goal(s):  Patient will work with the Care Management team over the next 30 days to address Transition of Care Barriers: Non weight bearing with recent amputation Patient will take all medications exactly as prescribed and will call provider for medication related questions as evidenced by patient report and health record Patient will demonstrate understanding of rationale for each prescribed medication as evidenced by patient report  Patient will attend all scheduled medical appointments Patient will recover from recent amputation free of preventable complications   Interventions: Evaluation of current treatment plan related to  self management and patient's adherence to plan as established by provider  Transitions of Care:  New goal. Doctor Visits  - discussed the importance of doctor visits Contacted provider regarding question related to Doxycycline - Patient was prescribed this 3/10 and went to hospital 04/07/23 and is not sure if she was receiving this during hospitalization (medication is listed on discharge summary as well as Augmentin- message was left for podiatrist, Dr Jamse Arn)  Diabetes Interventions:  (Status:  New goal.) Short Term Goal Assessed patient's understanding of A1c goal: <6.5% Provided education to patient about basic DM disease process Reviewed medications with patient and discussed importance of medication adherence Discussed plans with patient for ongoing care management follow up and provided patient with direct contact information for care management team Assessed patient understanding of wound care and patient states  dressing is to remain placed until follow up with podiatrist 04/17/23 and patient will monitor for breakthrough drainage and report to MD  Lab Results   Component Value Date   HGBA1C 6.7 (H) 12/21/2022    Patient Goals/Self-Care Activities: Participate in Transition of Care Program/Attend [facility] scheduled calls Notify RN Care Manager of [facility] call rescheduling needs Take all medications as prescribed Attend all scheduled provider appointments Call insurance for possible assistance Call PCP office to schedule hospital follow up  Follow Up Plan:  Telephone follow up appointment with care management team member scheduled for:  04/19/23 9:30am The patient has been provided with contact information for the care management team and has been advised to call with any health related questions or concerns.           Hilbert Odor RN, CCM [place]  VBCI-Population Health RN Care Manager [phone]

## 2023-04-13 NOTE — Telephone Encounter (Signed)
 Had surgery 04/09/2023 by Dr. Jamse Arn. Dr. Annamary Rummage made a visit while in hospital to see her and told her she will be seen Dr. Jamse Arn in [place] on Monday 04/17/2023, however there was no appointments  made for POV. Sch'ed an appt for 8:45 (only slot avail) for 3/24. She will need to arrange transportation and may need to change time. But also needed to confirm if she is to be seen on 3/24.

## 2023-04-13 NOTE — Telephone Encounter (Signed)
(  3 way call w/ Pt and Lucius Conn [facility] Nurse) Pt was prescribed doxycycline for 3/10-3/20 and was hospitalized on 3/14-3/19. She was then ordered Augmentin 3/19 for 6 days. Pt do not know if she was given doxycycline while hospitalized and wants to know do she need to continue to take what she has at home  w/ new orders (Augmentin). or what to do regarding having both medications. I advised them to call the charge nurse on the unit she was on during hospitalization to verify if given during her stay but they want Dr. Jamse Arn to advise them on what to do.   Please F/U w/ Pt.

## 2023-04-14 LAB — AEROBIC/ANAEROBIC CULTURE W GRAM STAIN (SURGICAL/DEEP WOUND)

## 2023-04-17 ENCOUNTER — Encounter: Payer: Self-pay | Admitting: Podiatry

## 2023-04-17 ENCOUNTER — Ambulatory Visit (INDEPENDENT_AMBULATORY_CARE_PROVIDER_SITE_OTHER): Admitting: Podiatry

## 2023-04-17 DIAGNOSIS — Z89432 Acquired absence of left foot: Secondary | ICD-10-CM

## 2023-04-17 NOTE — Progress Notes (Unsigned)
 Subjective:  Patient ID: Linda [name], female    DOB: [birth year],  MRN: [id number]  Chief Complaint  Patient presents with   Routine Post Op    Left TMA with TAL 04/09/22    DOS: 04/09/2023 Procedure: Left foot TMA and TAL  53 y.o. female returns for post-op check. ***  Review of Systems: Negative except as noted in the HPI. Denies N/V/F/Ch.  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 No longer has    Current Outpatient Medications:    acetaminophen (TYLENOL) 500 MG tablet, Take 500 mg by mouth every 6 (six) hours as needed for moderate pain or mild pain., Disp: , Rfl:    albuterol (VENTOLIN HFA) 108 (90 Base) MCG/ACT inhaler, INHALE TWO PUFFS BY MOUTH INTO LUNGS every SIX hours orn FOR WHEEZING AND/OR SHORTNESS OF  BREATH, Disp: 8.5 g, Rfl: 1   amitriptyline (ELAVIL) 25 MG tablet, TAKE ONE TABLET BY MOUTH AT BEDTIME, Disp: 30 tablet, Rfl: 1   amoxicillin-clavulanate (AUGMENTIN) 875-125 MG tablet, Take 1 tablet by mouth every 12 (twelve) hours for 6 days., Disp: 12 tablet, Rfl: 0   atorvastatin (LIPITOR) 10 MG tablet, TAKE ONE TABLET BY MOUTH EVERY EVENING, Disp: 90 tablet, Rfl: 1   Blood Glucose Monitoring Suppl (ONETOUCH VERIO REFLECT) w/Device KIT, AS DIRECTED, Disp: , Rfl:    Blood Pressure Monitoring (SPHYGMOMANOMETER) MISC, 1 each by Does not apply route daily in the afternoon., Disp: 1 each, Rfl: 0   buPROPion (WELLBUTRIN XL) 300 MG 24 hr tablet, Take 1 tablet (300 mg total) by mouth every morning., Disp: 90 tablet, Rfl: 3   busPIRone (BUSPAR) 5 MG tablet, TAKE ONE TABLET BY MOUTH 3 TIMES DAILY, Disp: 90 tablet, Rfl: 3   Continuous Blood Gluc Receiver (FREESTYLE LIBRE 2 READER) DEVI, E11.69 Check blood sugar 4 times daily as directed, Disp: 1 each, Rfl: 0   Continuous Glucose Sensor (FREESTYLE LIBRE 3 PLUS SENSOR) MISC, Change sensor every 15 days., Disp: 6 each, Rfl: 1   cyclobenzaprine (FLEXERIL) 10 MG tablet, TAKE ONE TABLET BY MOUTH EVERY 8 HOURS AS NEEDED FOR MUSCLE SPASMS, Disp: 270 tablet,  Rfl: 1   dicyclomine (BENTYL) 20 MG tablet, TAKE ONE TABLET BY MOUTH BEFORE MEALS AND AT BEDTIME AS NEEDED FOR STOMACH CRAMPING, Disp: 180 tablet, Rfl: 1   FETZIMA 80 MG CP24, TAKE ONE CAPSULE BY MOUTH EVERY EVENING, Disp: 90 capsule, Rfl: 1   gabapentin (NEURONTIN) 600 MG tablet, Take 1 tablet (600 mg total) by mouth 3 (three) times daily for 14 days., Disp: 42 tablet, Rfl: 0   glucose blood test strip, Use as instructed to check FBS daily. E11.40, Disp: 100 each, Rfl: 12   Lancets (ONETOUCH DELICA PLUS LANCET30G) MISC, USE TO check blood glucose 2-3 times daily AS DIRECTED, Disp: 100 each, Rfl: 3   levothyroxine (SYNTHROID) 75 MCG tablet, Take 1 tablet (75 mcg total) by mouth daily before breakfast., Disp: 90  tablet, Rfl: 3   lisdexamfetamine (VYVANSE) 70 MG capsule, TAKE ONE CAPSULE BY MOUTH EVERY DAY, Disp: 30 capsule, Rfl: 0   losartan (COZAAR) 50 MG tablet, TAKE ONE TABLET BY MOUTH EVERY EVENING, Disp: 90 tablet, Rfl: 1   Magnesium 500 MG CAPS, Take 1 capsule (500 mg total) by mouth daily., Disp: 90 capsule, Rfl: 1   metoprolol succinate (TOPROL-XL) 25 MG 24 hr tablet, Take 0.5 tablets (12.5 mg total) by mouth daily., Disp: 45 tablet, Rfl: 0   morphine (MS CONTIN) 30 MG 12 hr tablet, Take 1 tablet (30 mg total) by mouth every 12 (twelve) hours., Disp: 60 tablet, Rfl: 0   MOUNJARO 7.5 MG/0.5ML Pen, INJECT 7.5 MG INTO THE SKIN EVERY WEEK, Disp: 2 mL, Rfl: 1   Multiple Vitamin (MULTIVITAMIN WITH MINERALS) TABS tablet, Take 1 tablet by mouth daily. Solar ray, Disp: , Rfl:    nystatin (MYCOSTATIN/NYSTOP) powder, Apply 1 Application topically 3 (three) times daily., Disp: 15 g, Rfl: 0   ondansetron (ZOFRAN) 4 MG tablet, Take 1 tablet (4 mg total) by mouth every 4 (four) hours as needed for nausea., Disp: 90 tablet, Rfl: 3   OVER THE COUNTER MEDICATION, Take 1 tablet by mouth in the morning and at bedtime. Lutein for eyes, Disp: , Rfl:    oxyCODONE (OXY IR/ROXICODONE) 5 MG immediate release tablet, Take 1 tablet (5 mg total) by mouth every 4 (four) hours as needed for moderate pain (pain score 4-6)., Disp: 10 tablet, Rfl: 0   pantoprazole (PROTONIX) 40 MG tablet, TAKE ONE TABLET BY MOUTH TWICE DAILY, Disp: 180 tablet, Rfl: 1   polyethylene glycol (MIRALAX / GLYCOLAX) 17 g packet, Take 17 g by mouth daily as needed for moderate constipation., Disp: , Rfl:    potassium chloride (MICRO-K) 10 MEQ CR capsule, Take 2 capsules (20 mEq total) by mouth 2 (two) times daily., Disp: 360 capsule, Rfl: 3   Probiotic Product (PROBIOTIC PO), Take 1 capsule by mouth in the morning. 3.2 billion cfu, Disp: , Rfl:    prochlorperazine (COMPAZINE) 5 MG tablet, TAKE ONE TABLET BY MOUTH every SIX hours AS NEEDED FOR NAUSEA AND  VOMITING, Disp: 30 tablet, Rfl: 1   ramelteon (ROZEREM) 8 MG tablet, TAKE ONE TABLET BY MOUTH AT BEDTIME, Disp: 90 tablet, Rfl: 1   rOPINIRole (REQUIP) 0.25 MG tablet, TAKE ONE TABLET BY MOUTH AT BEDTIME, Disp: 90 tablet, Rfl: 1   SURE COMFORT PEN NEEDLES 32G X 4 MM MISC, Use new needle with each injection., Disp: 100 each, Rfl: 1   SYNJARDY 12.05-998 MG TABS, TAKE ONE TABLET BY MOUTH TWICE DAILY, Disp: 60 tablet, Rfl: 1   tolnaftate (TINACTIN) 1 % powder, Apply 1 Application  topically 2 (two) times daily for 14 days., Disp: 28 g, Rfl: 0   TRESIBA FLEXTOUCH 200 UNIT/ML FlexTouch Pen, INJECT 60 UNITS SUBCUTANEOUSLY DAILY, Disp: 9 mL, Rfl: 3   Vitamin D, Ergocalciferol, (DRISDOL) 1.25 MG (50000 UNIT) CAPS capsule, TAKE ONE CAPSULE BY MOUTH EVERY FRIDAY AT 8AM, Disp: 12 capsule, Rfl: 1   VRAYLAR 3 MG capsule, TAKE ONE CAPSULE BY MOUTH EVERY DAY, Disp: 90 capsule, Rfl: 0 No current facility-administered medications for this visit.  Facility-Administered Medications Ordered in Other Visits:    sodium chloride flush (NS) 0.9 % injection 10 mL, 10 mL, Intracatheter, PRN, Mosher, Kelli A, PA-C, 10 mL at 02/19/21 1004  Social History   Tobacco Use  Smoking Status Never  Smokeless Tobacco Never    Allergies  Allergen Reactions   Codeine Shortness Of Breath and Other (See Comments)    Other reaction(s): SHOB    Augmentin [Amoxicillin-Pot Clavulanate] Diarrhea   Celebrex [Celecoxib] Other (See Comments)    Unknown reaction   Propranolol Other (See Comments)    Other reaction(s): Unknown   Propranolol Hcl Other (See Comments)    Unknown reaction  ask   Simvastatin Other (See Comments)    Other reaction(s): Unknown   Vytorin [Ezetimibe-Simvastatin] Other (See Comments)    Unknown reaction  Patient is not aware of an allergy to this medication, ask   Zetia [Ezetimibe] Other (See Comments)    Other reaction(s): Unknown   Objective:  There were no vitals filed for this visit. There is no  height or weight on file to calculate BMI. Constitutional Well developed. Well nourished.  Vascular Foot warm and well perfused. Capillary refill normal to all digits.   Neurologic Normal speech. Oriented to person, place, and time. Epicritic sensation to light touch grossly present bilaterally.  Dermatologic Skin healing well without signs of infection. Skin edges well coapted without signs of infection.  Orthopedic: Tenderness to palpation noted about the surgical site.   Radiographs: *** Assessment:   1. History of transmetatarsal amputation of left foot (HCC)    Plan:  Patient was evaluated and treated and all questions answered.  S/p foot surgery {Left/right:33004} -Progressing as expected post-operatively. -XR: *** -WB Status: *** in *** -Sutures: ***. -Medications: *** -Foot redressed.  Return in about 2 weeks (around 05/01/2023) for Post op check.    Left TMA healing well.  Some swelling and erythema present.  On antibiotics. Presents walking due transportation issues presents with encephalopathy trying to stay off of it. [employer] providing transportation  No wound to right fourth toe today. See if we can get some home health set up just to help with activities of daily living.

## 2023-04-19 ENCOUNTER — Other Ambulatory Visit: Payer: Self-pay

## 2023-04-19 ENCOUNTER — Telehealth: Payer: Self-pay

## 2023-04-19 NOTE — Patient Outreach (Signed)
 Care Management  Transitions of Care Program Transitions of Care Post-discharge week 2   04/19/2023 Name: Linda [name] MRN: [id number] DOB: [birth year]  Subjective: Linda [name] is a 53 y.o. year old female who is a primary care patient of [name], Kirsten, MD. The Care Management team Engaged with patient Engaged with patient by telephone to assess and address transitions of care needs.   Consent to Services:  Patient was given information about care management services, agreed to services, and gave verbal consent to participate.   Assessment:     Patient saw podiatrist as planned and reports next follow up is 05/01/23. Patient had not scheduled PCP appointment and agreed to [facility] RN sending message to PCP office requesting video visit due to her difficulty getting to appointments. Patient confirmed she is also working with her insurance CM, Boris Lown and may be getting some transportation and personal care assistance. Reviewed medications with patient who did state that last night she only took 40 units of her Evaristo Bury and her sugar during call was 168. Patient states she will take 60 units moving forward per discharge summary and will call MD if she feels she needs to adjust. Patient reports BP of 110/68 and states she feels her pain is well controlled with current medications. TOC RN reviewed risks of constipation and management.       SDOH Interventions    Flowsheet Row Telephone from 04/13/2023 in [place] [facility] Office Visit from 01/24/2023 in [name] Office Visit from 01/02/2023 in [name] Coordination from 10/04/2022 in [facility] Patient Outreach from 09/22/2022 in [place] [facility] Care Coordination from 08/17/2022 in [facility]  SDOH Interventions        Food Insecurity Interventions  Intervention Not Indicated -- -- -- -- --  Housing Interventions Intervention Not Indicated -- -- -- -- --  Transportation Interventions Intervention Not Indicated -- -- -- -- --  Utilities Interventions Intervention Not Indicated -- -- -- -- --  Depression Interventions/Treatment  -- Medication, Currently on Treatment Currently on Treatment Counseling -- Counseling  Physical Activity Interventions -- -- -- Other (Comments)  [client uses a rollator walker to help her walk] -- Other (Comments)  [some mobility issues. some walking challenges. uses rollator as needed]  Stress Interventions -- -- -- Provide Counseling  [client has stress in managing her medical needs] -- Provide Counseling  [has some stress in managing medical needs and daily needs]  Health Literacy Interventions -- -- -- -- Intervention Not Indicated --        Goals Addressed               This Visit's Progress     TOC Care Plan - Patient will report no readmissions in the next 30 days (pt-stated)        Current Barriers:  Patient with Left trans metatarsal amputation now in wheelchair and adjusting to non-weight bearing status (04/19/23 patient seen by podiatry 04/17/23 and had to go to appointment without wheelchair - discussed non-weight bearing - Patient states her CM with [facility] is working on getting her wheelchair transport - states SW was referred by [facility])  RNCM Clinical Goal(s):  Patient will work with the Care Management team over the next 30 days to address Transition of Care Barriers: Non weight bearing with recent amputation Patient will take all medications exactly as prescribed and will call provider  for medication related questions as evidenced by patient report and health record Patient will demonstrate understanding of rationale for each prescribed medication as evidenced by patient report  Patient will attend all scheduled medical appointments Patient will recover from recent amputation free of preventable  complications   Interventions: Evaluation of current treatment plan related to  self management and patient's adherence to plan as established by provider  Transitions of Care:  Goal on Track. Doctor Visits  - discussed the importance of doctor visits - (04/19/23 patient was seen by podiatry - TOC RN will send message to Dr Sedalia Muta for possible hospital follow up virtually Contacted provider regarding question related to Doxycycline - Patient was prescribed this 3/10 and went to hospital 04/07/23 and is not sure if she was receiving this during hospitalization (medication is listed on discharge summary as well as Augmentin - (04/19/23 patient reports Augmentin completed - Per podiatrist note patient continue with doxycycline until gone)  Diabetes Interventions:  (Status:  Goal on Track) Short Term Goal Assessed patient's understanding of A1c goal: <6.5% Provided education to patient about basic DM disease process Reviewed medications with patient and discussed importance of medication adherence Discussed plans with patient for ongoing care management follow up and provided patient with direct contact information for care management team Assessed patient understanding of wound care and patient states  dressing is to remain placed until follow up with podiatrist 04/17/23 and patient will monitor for breakthrough drainage and report to MD  Lab Results  Component Value Date   HGBA1C 6.7 (H) 12/21/2022    Patient Goals/Self-Care Activities: Participate in Transition of Care Program/Attend [facility] scheduled calls Notify RN Care Manager of TOC call rescheduling needs Take all medications as prescribed Attend all scheduled provider appointments Call insurance for possible assistance - Spoke with [facility] CM, Boris Lown - referred SW and they are working assistance in home and transportation assistance. Call PCP office to schedule hospital follow up - patient did not call per 04/19/23 call - [facility] RN will send  message to PCP to request virtual visit  Follow Up Plan:  Telephone follow up appointment with care management team member scheduled for:  04/26/23 11am The patient has been provided with contact information for the care management team and has been advised to call with any health related questions or concerns.          Plan: Telephone follow up appointment with care management team member scheduled for: 04/26/23 11am The patient has been provided with contact information for the care management team and has been advised to call with any health related questions or concerns.   Hilbert Odor RN, CCM [place]  [facility]Population Health RN Care Manager [phone]

## 2023-04-20 ENCOUNTER — Telehealth: Payer: Self-pay

## 2023-04-20 NOTE — Telephone Encounter (Signed)
-----   Message from [name] sent at 04/19/2023 10:15 PM EDT ----- Regarding: FW: Hospital follow up Call patient and see if she needs to be seen. Podiatry has already seen her for hospital follow up. If she does, do it with other provider next week. May do virtually. [name] ----- Message ----- From: Jessy Oto, RN Sent: 04/19/2023  10:26 AM EDT To: [name], MD Subject: Hospital follow up                             Dr Sedalia Muta,  Patient discharged 04/12/23 with left trans metatarsal amputation. She has great difficulty getting to appointments. Wondered if your office could schedule a video hospital follow up with her.If so, can your office call to schedule?  Hilbert Odor RN, CCM [place]  [facility]-Population Health RN Care Manager [phone]

## 2023-04-20 NOTE — Telephone Encounter (Signed)
 Called patient left message for patient to call office back if she needs to make an appointment

## 2023-04-21 ENCOUNTER — Telehealth: Payer: Self-pay | Admitting: Podiatry

## 2023-04-21 NOTE — Telephone Encounter (Signed)
 Deanna, pts home health nurse, would like verbal orders to start pt with pt as pt is having difficulty in her offloading boot. Contact number is [phone].

## 2023-04-26 ENCOUNTER — Other Ambulatory Visit: Payer: Self-pay

## 2023-04-26 ENCOUNTER — Telehealth: Payer: Self-pay

## 2023-04-26 NOTE — Patient Outreach (Signed)
 Care Management  Transitions of Care Program Transitions of Care Post-discharge week 3   04/26/2023 Name: Linda [name] MRN: [id number] DOB: 1970-10-29  Subjective: Linda [name] is a 53 y.o. year old female who is a primary care patient of [name], Kirsten, MD. The Care Management team Engaged with patient Engaged with patient by telephone to assess and address transitions of care needs.   Consent to Services:  Patient was given information about care management services, agreed to services, and gave verbal consent to participate.   Assessment:     Patient doing well with next podiatry appointment scheduled 05/01/23 - denied any breakthrough drainage on left foot dressing. TOC RN reviewed with patient diabetes management and sugar of 279 as reported by patient via her Freestyle Libre 3. Provided education on fast acting versus long acting Evaristo Bury) insulins after patient stated her dose was being adjusted to around 2-3 units when she was in the hospital. [facility] RN educated patient to continue to closely monitor and call MD if numbers remain high. Patient verbalized understanding and thanked [facility] RN for explanation of fast acting versus long acting. Patient states she has spoken with SW and has [facility] RN visiting monthly now and [facility] program is still provide assistance in the home as well. Patient states she has 3 month follow up appointment scheduled with PCP 05/03/23 but if she is too tired after 05/01/23 podiatry appointment, she will call and reschedule PCP appointment to the following week. Patient agreeable to [facility] RN follow up call next week as scheduled       SDOH Interventions    Flowsheet Row Telephone from 04/13/2023 in [place] POPULATION HEALTH DEPARTMENT Office Visit from 01/24/2023 in [name] Office Visit from 01/02/2023 in [name] Care Coordination from 10/04/2022 in [facility] Care Coordination  Patient Outreach from 09/22/2022 in [place] [facility] POPULATION HEALTH DEPARTMENT Care Coordination from 08/17/2022 in [facility] Care Coordination  SDOH Interventions        Food Insecurity Interventions Intervention Not Indicated -- -- -- -- --  Housing Interventions Intervention Not Indicated -- -- -- -- --  Transportation Interventions Intervention Not Indicated -- -- -- -- --  Utilities Interventions Intervention Not Indicated -- -- -- -- --  Depression Interventions/Treatment  -- Medication, Currently on Treatment Currently on Treatment Counseling -- Counseling  Physical Activity Interventions -- -- -- Other (Comments)  [client uses a rollator walker to help her walk] -- Other (Comments)  [some mobility issues. some walking challenges. uses rollator as needed]  Stress Interventions -- -- -- Provide Counseling  [client has stress in managing her medical needs] -- Provide Counseling  [has some stress in managing medical needs and daily needs]  Health Literacy Interventions -- -- -- -- Intervention Not Indicated --        Goals Addressed               This Visit's Progress     TOC Care Plan - Patient will report no readmissions in the next 30 days (pt-stated)        Current Barriers:  Patient with Left trans metatarsal amputation now in wheelchair and adjusting to non-weight bearing status (04/19/23 patient seen by podiatry 04/17/23 and had to go to appointment without wheelchair - discussed non-weight bearing - Patient states her CM with [facility] is working on getting her wheelchair transport - states SW was referred by [facility]-(04/26/23 patient states she spoke  with SW and someone from [facility] will be coming monthly (a nurse) and states arrangements have been made for someone to come to her home to assist patient in her home with [facility] Kenner)  RNCM Clinical Goal(s):  Patient will work with the Care Management team over the next 30 days to address Transition of Care Barriers:  Non weight bearing with recent amputation Patient will take all medications exactly as prescribed and will call provider for medication related questions as evidenced by patient report and health record Patient will demonstrate understanding of rationale for each prescribed medication as evidenced by patient report  Patient will attend all scheduled medical appointments Patient will recover from recent amputation free of preventable complications   Interventions: Evaluation of current treatment plan related to  self management and patient's adherence to plan as established by provider  Transitions of Care:  Goal on Track. Doctor Visits  - discussed the importance of doctor visits - (04/19/23 patient was seen by podiatry - TOC RN will send message to Dr Sedalia Muta for possible hospital follow up virtually (04/26/23 patient reports Dr Sedalia Muta told her okay to follow with podiatry 4/7 - patient has 4/9 hospital follow up with Dr Sedalia Muta that she may need to cancel-states she will call to Dr Sedalia Muta to see if it can be rescheduled to the following week)  Contacted provider regarding question related to Doxycycline - Patient was prescribed this 3/10 and went to hospital 04/07/23 and is not sure if she was receiving this during hospitalization (medication is listed on discharge summary as well as Augmentin - (04/19/23 patient reports Augmentin completed - Per podiatrist note patient continue with doxycycline until gone) - 04/26/23 completed  Diabetes Interventions:  (Status:  Goal on Track) Short Term Goal Assessed patient's understanding of A1c goal: <6.5% Provided education to patient about basic DM disease process Reviewed medications with patient and discussed importance of medication adherence Discussed plans with patient for ongoing care management follow up and provided patient with direct contact information for care management team Assessed patient understanding of wound care and patient states  dressing is to remain  placed until follow up with podiatrist 05/01/23 and patient will monitor for breakthrough drainage and report to MD  Lab Results  Component Value Date   HGBA1C 6.7 (H) 12/21/2022    Patient Goals/Self-Care Activities: Participate in Transition of Care Program/Attend [facility] scheduled calls Notify RN Care Manager of [facility] call rescheduling needs Take all medications as prescribed Attend all scheduled provider appointments Call insurance for possible assistance - Spoke with [facility] CM, Boris Lown - referred SW and they are working assistance in home and transportation assistance. Call PCP office to schedule hospital follow up - patient did not call per 04/19/23 call - [facility] RN will send message to PCP to request virtual visit (04/26/23 per record and patient report-she heard from Dr [name] office and states MD is okay with her following with podiatry but patient has 3 month follow currently scheduled with PCP 05/03/23 - may need to reschedule for following week)  Follow Up Plan:  Telephone follow up appointment with care management team member scheduled for:  05/03/23 2pm The patient has been provided with contact information for the care management team and has been advised to call with any health related questions or concerns.          Plan: Telephone follow up appointment with care management team member scheduled for: 05/03/23 2pm The patient has been provided with contact information for the  care management team and has been advised to call with any health related questions or concerns.   Hilbert Odor RN, CCM [place]  VBCI-Population Health RN Care Manager [phone]

## 2023-04-26 NOTE — Patient Instructions (Signed)
 Visit Information  Thank you for taking time to visit with me today. Please don't hesitate to contact me if I can be of assistance to you before our next scheduled telephone appointment.  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 and adjusting to non-weight bearing status (04/19/23 patient seen by podiatry 04/17/23 and had to go to appointment without wheelchair - discussed non-weight bearing - Patient states her CM with [facility] is working on getting her wheelchair transport - states SW was referred by [facility]-(04/26/23 patient states she spoke with SW and someone from [facility] will be coming monthly (a Engineer, civil (consulting)) and states arrangements have been made for someone to come to her home to assist patient in her home with Allyn Kenner)  RNCM Clinical Goal(s):  Patient will work with the Care Management team over the next 30 days to address Transition of Care Barriers: Non weight bearing with recent amputation Patient will take all medications exactly as prescribed and will call provider for medication related questions as evidenced by patient report and health record Patient will demonstrate understanding of rationale for each prescribed medication as evidenced by patient report  Patient will attend all scheduled medical appointments Patient will recover from recent amputation free of preventable complications   Interventions: Evaluation of current treatment plan related to  self management and patient's adherence to plan as established by provider  Transitions of Care:  Goal on Track. Doctor Visits  - discussed the importance of doctor visits - (04/19/23 patient was seen by podiatry - TOC RN will send message to Dr Sedalia Muta for  possible hospital follow up virtually (04/26/23 patient reports Dr Sedalia Muta told her okay to follow with podiatry 4/7 - patient has 4/9 hospital follow up with Dr Sedalia Muta that she may need to cancel-states she will call to Dr Sedalia Muta to see if it can be rescheduled to the following week)  Contacted provider regarding question related to Doxycycline - Patient was prescribed this 3/10 and went to hospital 04/07/23 and is not sure if she was receiving this during hospitalization (medication is listed on discharge summary as well as Augmentin - (04/19/23 patient reports Augmentin completed - Per podiatrist note patient continue with doxycycline until gone) - 04/26/23 completed  Diabetes Interventions:  (Status:  Goal on Track) Short Term Goal Assessed patient's understanding of A1c goal: <6.5% Provided education to patient about basic DM disease process Reviewed medications with patient and discussed importance of medication adherence Discussed plans with patient for ongoing care management follow up and provided patient with direct contact information for care management team Assessed patient understanding of wound care and patient states  dressing is to remain placed until follow up with podiatrist 05/01/23 and patient will monitor for breakthrough drainage and report to MD  Lab Results  Component Value Date   HGBA1C 6.7 (H) 12/21/2022    Patient Goals/Self-Care Activities: Participate in Transition of Care Program/Attend [facility] scheduled calls Notify RN Care Manager of [facility] call rescheduling needs Take all medications as prescribed Attend all scheduled provider appointments Call insurance for possible assistance - Spoke with [facility] CM, Herbert Seta  Talley - referred SW and they are working assistance in home and transportation assistance. Call PCP office to schedule hospital follow up - patient did not call per 04/19/23 call - [facility] RN will send message to PCP to request virtual visit (04/26/23 per record and patient report-she heard  from Dr [name] office and states MD is okay with her following with podiatry but patient has 3 month follow currently scheduled with PCP 05/03/23 - may need to reschedule for following week)  Follow Up Plan:  Telephone follow up appointment with care management team member scheduled for:  05/03/23 2pm The patient has been provided with contact information for the care management team and has been advised to call with any health related questions or concerns.          Patient verbalizes understanding of instructions and care plan provided today and agrees to view in MyChart. Active MyChart status and patient understanding of how to access instructions and care plan via MyChart confirmed with patient.     Telephone follow up appointment with care management team member scheduled for: 05/03/23 2pm The patient has been provided with contact information for the care management team and has been advised to call with any health related questions or concerns.   Please call the care guide team at [phone] if you need to cancel or reschedule your appointment.   Please call the Suicide and Crisis Lifeline: 988 call the [place] National Suicide Prevention Lifeline: 939 513 2929 or TTY: (702)326-0257 TTY (940)024-8130) to talk to a trained counselor call 1-800-273-TALK (toll free, 24 hour hotline) call 911 if you are experiencing a Mental Health or Behavioral Health Crisis or need someone to talk to.  Hilbert Odor RN, CCM [place]  VBCI-Population Health RN Care Manager [phone]

## 2023-04-27 ENCOUNTER — Other Ambulatory Visit: Payer: Self-pay | Admitting: Physician Assistant

## 2023-04-27 ENCOUNTER — Other Ambulatory Visit: Payer: Self-pay | Admitting: Family Medicine

## 2023-04-27 DIAGNOSIS — E1140 Type 2 diabetes mellitus with diabetic neuropathy, unspecified: Secondary | ICD-10-CM

## 2023-04-27 NOTE — Telephone Encounter (Signed)
 LMOM for HH, Linda [name], to start PT to help with the offloading boot stability.

## 2023-04-28 ENCOUNTER — Other Ambulatory Visit: Payer: Self-pay | Admitting: Physician Assistant

## 2023-04-28 DIAGNOSIS — E1140 Type 2 diabetes mellitus with diabetic neuropathy, unspecified: Secondary | ICD-10-CM

## 2023-05-01 ENCOUNTER — Ambulatory Visit (INDEPENDENT_AMBULATORY_CARE_PROVIDER_SITE_OTHER)

## 2023-05-01 ENCOUNTER — Ambulatory Visit (INDEPENDENT_AMBULATORY_CARE_PROVIDER_SITE_OTHER): Admitting: Podiatry

## 2023-05-01 ENCOUNTER — Encounter: Payer: Self-pay | Admitting: Podiatry

## 2023-05-01 DIAGNOSIS — Z89432 Acquired absence of left foot: Secondary | ICD-10-CM | POA: Diagnosis not present

## 2023-05-01 DIAGNOSIS — L03116 Cellulitis of left lower limb: Secondary | ICD-10-CM

## 2023-05-01 DIAGNOSIS — T8131XA Disruption of external operation (surgical) wound, not elsewhere classified, initial encounter: Secondary | ICD-10-CM

## 2023-05-01 MED ORDER — SULFAMETHOXAZOLE-TRIMETHOPRIM 800-160 MG PO TABS: 1.0000 | ORAL_TABLET | Freq: Two times a day (BID) | ORAL | 0 refills | Status: AC

## 2023-05-01 NOTE — Progress Notes (Signed)
 Subjective:  Patient ID: Linda [name], female    DOB: Feb 02, 1970,  MRN: [id number]  Chief Complaint  Patient presents with   Post OP     POV 2 Since ampput    DOS: 04/09/2023 Procedure: Left foot TMA and TAL  53 y.o. female returns for post-op check #3.  She presents in surgical shoe.  She states that due to history of back surgeries the walking boot is too heavy for her.  She reports that she has been unable to stay nonweightbearing though she does limit her activity is much as possible.  Review of Systems: Negative except as noted in the HPI. Denies N/V/F/Ch.  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 No longer has    Current Outpatient Medications:    sulfamethoxazole-trimethoprim (BACTRIM DS) 800-160 MG tablet,  Take 1 tablet by mouth 2 (two) times daily for 14 days., Disp: 28 tablet, Rfl: 0   acetaminophen (TYLENOL) 500 MG tablet, Take 500 mg by mouth every 6 (six) hours as needed for moderate pain or mild pain., Disp: , Rfl:    albuterol (VENTOLIN HFA) 108 (90 Base) MCG/ACT inhaler, INHALE TWO PUFFS BY MOUTH INTO LUNGS every SIX hours orn FOR WHEEZING AND/OR SHORTNESS OF BREATH, Disp: 8.5 g, Rfl: 1   amitriptyline (ELAVIL) 25 MG tablet, TAKE ONE TABLET BY MOUTH AT BEDTIME, Disp: 30 tablet, Rfl: 1   atorvastatin (LIPITOR) 10 MG tablet, TAKE ONE TABLET BY MOUTH EVERY EVENING, Disp: 90 tablet, Rfl: 1   Blood Glucose Monitoring Suppl (ONETOUCH VERIO REFLECT) w/Device KIT, AS DIRECTED, Disp: , Rfl:    Blood Pressure Monitoring (SPHYGMOMANOMETER) MISC, 1 each by Does not apply route daily in the afternoon., Disp: 1 each, Rfl: 0   buPROPion (WELLBUTRIN XL) 300 MG 24 hr tablet, Take 1 tablet (300 mg total) by mouth every morning., Disp: 90 tablet, Rfl: 3   busPIRone (BUSPAR) 5 MG tablet, TAKE ONE TABLET BY MOUTH 3 TIMES DAILY, Disp: 90 tablet, Rfl: 3   Continuous Blood Gluc Receiver (FREESTYLE LIBRE 2 READER) DEVI, E11.69 Check blood sugar 4 times daily as directed (Patient not taking: Reported  on 04/26/2023), Disp: 1 each, Rfl: 0   Continuous Glucose Sensor (FREESTYLE LIBRE 3 PLUS SENSOR) MISC, Change sensor every 15 days., Disp: 6 each, Rfl: 1   cyclobenzaprine (FLEXERIL) 10 MG tablet, TAKE ONE TABLET BY MOUTH EVERY 8 HOURS AS NEEDED FOR MUSCLE SPASMS, Disp: 270 tablet, Rfl: 1   dicyclomine (BENTYL) 20 MG tablet, TAKE ONE TABLET BY MOUTH BEFORE MEALS AND AT BEDTIME AS NEEDED FOR STOMACH CRAMPING (Patient not taking: No sig reported), Disp: 180 tablet, Rfl: 1   FETZIMA 80 MG CP24, TAKE ONE CAPSULE BY MOUTH EVERY EVENING, Disp: 90 capsule, Rfl: 1   gabapentin (NEURONTIN) 600 MG tablet, Take 1 tablet (600 mg total) by mouth 3 (three) times daily for 14 days., Disp: 42 tablet, Rfl: 0   glucose blood test strip, Use as  instructed to check FBS daily. E11.40, Disp: 100 each, Rfl: 12   Lancets (ONETOUCH DELICA PLUS LANCET30G) MISC, USE TO check blood glucose 2-3 times daily AS DIRECTED, Disp: 100 each, Rfl: 3   levothyroxine (SYNTHROID) 75 MCG tablet, Take 1 tablet (75 mcg total) by mouth daily before breakfast., Disp: 90 tablet, Rfl: 3   lisdexamfetamine (VYVANSE) 70 MG capsule, TAKE ONE CAPSULE BY MOUTH EVERY DAY, Disp: 30 capsule, Rfl: 0   losartan (COZAAR) 50 MG tablet, TAKE ONE TABLET BY MOUTH EVERY EVENING, Disp: 90 tablet, Rfl: 1   Magnesium 500 MG CAPS, Take 1 capsule (500 mg total) by mouth daily., Disp: 90 capsule, Rfl: 1   metoprolol succinate (TOPROL-XL) 25 MG 24 hr tablet, Take 0.5 tablets (12.5 mg total) by mouth daily., Disp: 45 tablet, Rfl: 0   morphine (MS CONTIN) 30 MG 12 hr tablet, Take 1 tablet (30 mg total) by mouth every 12 (twelve) hours., Disp: 60 tablet, Rfl: 0   MOUNJARO 7.5 MG/0.5ML Pen, INJECT 7.5 MG INTO THE SKIN EVERY WEEK, Disp: 2 mL, Rfl: 1   Multiple Vitamin (MULTIVITAMIN WITH MINERALS) TABS tablet, Take 1 tablet by mouth daily. Solar ray, Disp: , Rfl:    nystatin (MYCOSTATIN/NYSTOP) powder, Apply 1 Application topically 3 (three) times daily., Disp: 15 g, Rfl: 0   ondansetron (ZOFRAN) 4 MG tablet, Take 1 tablet (4 mg total) by mouth every 4 (four) hours as needed for nausea., Disp: 90 tablet, Rfl: 3   OVER THE COUNTER MEDICATION, Take 1 tablet by mouth in the morning and at bedtime. Lutein for eyes, Disp: , Rfl:    oxyCODONE (OXY IR/ROXICODONE) 5 MG immediate release tablet, Take 1 tablet (5 mg total) by mouth every 4 (four) hours as needed for moderate pain (pain score 4-6)., Disp: 10 tablet, Rfl: 0   pantoprazole (PROTONIX) 40 MG tablet, TAKE ONE TABLET BY MOUTH TWICE DAILY, Disp: 180 tablet, Rfl: 1   polyethylene glycol (MIRALAX / GLYCOLAX) 17 g packet, Take 17 g by mouth daily as needed for moderate constipation. (Patient not taking: Reported on 04/19/2023), Disp: , Rfl:     potassium chloride (MICRO-K) 10 MEQ CR capsule, Take 2 capsules (20 mEq total) by mouth 2 (two) times daily., Disp: 360 capsule, Rfl: 3   Probiotic Product (PROBIOTIC PO), Take 1 capsule by mouth in the morning. 3.2 billion cfu, Disp: , Rfl:    prochlorperazine (COMPAZINE) 5 MG tablet, TAKE ONE TABLET BY MOUTH every SIX hours AS NEEDED FOR NAUSEA AND VOMITING (Patient not taking: No sig reported), Disp: 30 tablet, Rfl: 1   ramelteon (ROZEREM) 8 MG tablet, TAKE ONE TABLET BY MOUTH AT BEDTIME, Disp: 90 tablet, Rfl: 1  rOPINIRole (REQUIP) 0.25 MG tablet, TAKE ONE TABLET BY MOUTH AT BEDTIME, Disp: 90 tablet, Rfl: 1   SURE COMFORT PEN NEEDLES 32G X 4 MM MISC, Use new needle with each injection., Disp: 100 each, Rfl: 1   SYNJARDY 12.05-998 MG TABS, TAKE ONE TABLET BY MOUTH TWICE DAILY, Disp: 60 tablet, Rfl: 1   TRESIBA FLEXTOUCH 200 UNIT/ML FlexTouch Pen, INJECT 60 UNITS SUBCUTANEOUSLY DAILY, Disp: 9 mL, Rfl: 3   Vitamin D, Ergocalciferol, (DRISDOL) 1.25 MG (50000 UNIT) CAPS capsule, TAKE ONE CAPSULE BY MOUTH EVERY FRIDAY AT 8AM, Disp: 12 capsule, Rfl: 1   VRAYLAR 3 MG capsule, TAKE ONE CAPSULE BY MOUTH EVERY DAY, Disp: 90 capsule, Rfl: 0 No current facility-administered medications for this visit.  Facility-Administered Medications Ordered in Other Visits:    sodium chloride flush (NS) 0.9 % injection 10 mL, 10 mL, Intracatheter, PRN, Mosher, Kelli A, PA-C, 10 mL at 02/19/21 1004  Social History   Tobacco Use  Smoking Status Never  Smokeless Tobacco Never    Allergies  Allergen Reactions   Codeine Shortness Of Breath and Other (See Comments)    Other reaction(s): SHOB    Augmentin [Amoxicillin-Pot Clavulanate] Diarrhea   Celebrex [Celecoxib] Other (See Comments)    Unknown reaction   Propranolol Other (See Comments)    Other reaction(s): Unknown   Propranolol Hcl Other (See Comments)    Unknown reaction  ask   Simvastatin Other (See Comments)    Other reaction(s): Unknown   Vytorin  [Ezetimibe-Simvastatin] Other (See Comments)    Unknown reaction  Patient is not aware of an allergy to this medication, ask   Zetia [Ezetimibe] Other (See Comments)    Other reaction(s): Unknown   Objective:  There were no vitals filed for this visit. There is no height or weight on file to calculate BMI. Constitutional Well developed. Well nourished.  Vascular Foot warm and well perfused.  Palpable pedal pulses Capillary refill intact to TMA flap.  There is some erythema present of the forefoot into the posterior heel at TAL sites.  There is mild left lower extremity edema.  Neurologic Normal speech. Oriented to person, place, and time. Protective sensation intact  Dermatologic Lateral aspect of TMA incision there is area of superficial dehiscence about 1.5 cm in width with fat layer exposed. No significant drainage here.  Wound bed fibro granular with overlying bioburden noted.  Staples within this area are no longer holding skin approximated.  The remaining sutures and staples are intact, remainder of the incision is well-approximated with skin edges well coapted.  Posterior TMA stab incisions appear well-healed.  Orthopedic: Minimal tenderness to palpation noted about the surgical site.     Radiographs: Left foot 3 views 05/01/2023 Surgical changes status post left foot transmetatarsal amputation noted. Stable appearing in AP and Lateral view. Possible slight resorption at osteotomy sites vs previous with some calcification distally, unlcear if stress reaction due to continued wb throughout postoperative course vs possible OM. No soft tissue emphysema  Assessment:   1. Cellulitis of left foot   2. History of transmetatarsal amputation of left foot (HCC)   3. Wound dehiscence, surgical, initial encounter    Plan:  Patient was evaluated and treated and all questions answered.  S/p foot surgery left transmetatarsal amputation and tendo Achilles lengthening -Radiographs reviewed  with patient today -Skin staples removed today without incident.  Remaining sutures left intact.  TMA incision dressed with Xeroform, 4 x 4 gauze, Kerlix, Ace wrap -Patient has been unable to  maintain strict nonweightbearing due to overall functional status. Instructed her to limit nonweightbearing is much as possible. -Concern for cellulitis clinically.  Repeat radiographs in 2 weeks to monitor for any further changes.  If anything is noticeable, consider MRI and labs -Starting patient on oral Bactrim 2 weeks based on OR cultures -Will update home health orders to have dressings changed 3x a week as detailed above. - Monitor for signs of worsening infection.  Return in about 2 weeks (around 05/15/2023).

## 2023-05-02 ENCOUNTER — Other Ambulatory Visit: Payer: Self-pay

## 2023-05-02 ENCOUNTER — Telehealth: Payer: Self-pay

## 2023-05-02 DIAGNOSIS — T8131XA Disruption of external operation (surgical) wound, not elsewhere classified, initial encounter: Secondary | ICD-10-CM

## 2023-05-02 NOTE — Telephone Encounter (Signed)
 New HH orders faxed to [facility] Phone [phone] [phone]

## 2023-05-02 NOTE — Progress Notes (Unsigned)
 Subjective:  Patient ID: Linda [name], female    DOB: 12/19/1970  Age: 54 y.o. MRN: [id number]  Chief Complaint  Patient presents with   Medical Management of Chronic Issues    Discussed the use of AI scribe software for clinical note transcription with the patient, who gave verbal consent to proceed.  History of Present Illness   The patient, with a history of diabetes, presents with elevated blood glucose levels in the 160s and 170s, higher than her usual range. She attributes this to a recent infection. She reports her last A1c was 6.7, but a recent check showed it had increased to 7 (on her cgm.) She was off her Mounjaro prior to an emergency surgery, which may have contributed to the elevated levels. She denies any hypoglycemic episodes.  The patient also has a history of back pain, which has been exacerbated recently due to wearing a boot for a foot condition. She reports severe pain that left her bedridden for three days. She is currently on morphine 30mg  twice a day for the pain and has been prescribed oxycodone postop.  She also has a history of depression, which is managed with Fetzima, Wellbutrin, buspirone, and Vraylar. She reports good control of her symptoms with this regimen.   She also takes Vyvanse for binge eating, pantoprazole twice a day for acid reflux, Rozerem for sleep, and dicyclomine as needed.  The patient recently had surgery on her foot, which has resulted in an infection. She is currently on Bactrim to prevent further infection. She reports no fevers, chills, sweats, earaches, sore throat, or stuffy nose.        Diabetes:  Complications: HYPERTENSION, CKD Glucose checking: CGM Free Style Libre 2. 160-170s. Glucose logs: see download below.  Hypoglycemia: no Most recent A1C: 6.7. Current medications: Mounjaro 7.5mg  once weekly, Synjardy 12.5 mg/ 1000 mg 1 tablet twice daily, Tresiba inject 60 units daily. Last Eye Exam: August 2024.  Foot checks:  Daily  Hyperlipidemia: Current medications: Taking Atorvastatin 10 mg 1 tablet daily, lovaza 1 gm 2 capsules twice daily.   Hypertensive kidney disease with Stage 3B chronic kidney disease: Complications: DM Current medications: Losartan potassium 50 mg take 1 tablet daily, Potassium Chloride 10 meq take 2 capsules twice daily.  Acquired Hypothyroidism: Patient is currently taking Levothyroxine 75 mcg daily.  Chronic Pain Syndrome: Chronic back pain treated with Morphine 30 mg take 1 tablet twice daily, Gabapentin 400 mg take 2 capsules three times a day. Given oxycodone for brief period after surgery on left foot.   Binge eating disorder: Patient is currently taking Vyvanse 70 mg daily.  Vitamin D deficiency: Patient is currently taking Vitamin D 50,000 units 1 capsule weekly.  GERD:  Pantoprazole 40 mg 1 tablet TWICE DAILY.  Depression, mild, recurrent: Patient is currently taking Fetzima 80 mg daily, Bupropion XL 300 mg 1 tablet daily, Buspirone 5 mg take 1 tablet three times a day, vraylar 3 mg daily.   Insomnia: rozerem 8 mg before bed.   IBS D: has dicyclomine that uses as needed.   Had recent toe amputation about 2-3 weeks ago. She is following with Dr. Edmundo Gourd. On Bactrim D for this.   Home health care Gas[facility] Karst is coming out to see patient. She has sore on left buttock.       05/03/2023    7:48 AM 01/24/2023    7:30 AM 01/02/2023    2:01 PM 10/04/2022    3:49 PM 09/14/2022    8:01 AM  Depression screen PHQ 2/9  Decreased Interest 0 0 1 1 1   Down, Depressed, Hopeless 0 1 1 1 1   PHQ - 2 Score 0 1 2 2 2   Altered sleeping 3 1 2  0 3  Tired, decreased energy 1 1 2 1 2   Change in appetite 1 2 1 1 2   Feeling bad or failure about yourself  0 0 0 1 0  Trouble concentrating 2 2 1  0 0  Moving slowly or fidgety/restless 0 2 2 1 1   Suicidal thoughts 0 0 0 0 0  PHQ-9 Score 7 9 10 6 10   Difficult doing work/chores  Very difficult Somewhat difficult Somewhat difficult Somewhat  difficult        05/03/2023    7:48 AM  Fall Risk   Falls in the past year? 0  Number falls in past yr: 0  Injury with Fall? 0  Risk for fall due to : No Fall Risks    Patient Care Team: Mercy Stall, MD as PCP - General (Internal Medicine) Nolia Baumgartner, MD as Consulting Physician (Oncology) Murtis Arthur, OD (Optometry) Sharmaine Dearth, RN as Registered Nurse   Review of Systems  Constitutional:  Negative for chills, fatigue and fever.  HENT:  Negative for congestion, ear pain and sore throat.   Respiratory:  Negative for cough and shortness of breath.   Cardiovascular:  Negative for chest pain.  Gastrointestinal:  Negative for abdominal pain, constipation, diarrhea, nausea and vomiting.  Genitourinary:  Negative for dysuria and frequency.  Musculoskeletal:  Negative for arthralgias and myalgias.  Skin:  Positive for wound.  Neurological:  Negative for dizziness and headaches.  Psychiatric/Behavioral:  Negative for dysphoric mood. The patient is not nervous/anxious.     Current Outpatient Medications on File Prior to Visit  Medication Sig Dispense Refill   acetaminophen (TYLENOL) 500 MG tablet Take 500 mg by mouth every 6 (six) hours as needed for moderate pain or mild pain.     albuterol (VENTOLIN HFA) 108 (90 Base) MCG/ACT inhaler INHALE TWO PUFFS BY MOUTH INTO LUNGS every SIX hours orn FOR WHEEZING AND/OR SHORTNESS OF BREATH 8.5 g 1   amitriptyline (ELAVIL) 25 MG tablet TAKE ONE TABLET BY MOUTH AT BEDTIME 30 tablet 1   atorvastatin (LIPITOR) 10 MG tablet TAKE ONE TABLET BY MOUTH EVERY EVENING 90 tablet 1   Blood Glucose Monitoring Suppl (ONETOUCH VERIO REFLECT) w/Device KIT AS DIRECTED     Blood Pressure Monitoring (SPHYGMOMANOMETER) MISC 1 each by Does not apply route daily in the afternoon. 1 each 0   buPROPion (WELLBUTRIN XL) 300 MG 24 hr tablet Take 1 tablet (300 mg total) by mouth every morning. 90 tablet 3   busPIRone (BUSPAR) 5 MG tablet TAKE ONE TABLET BY  MOUTH 3 TIMES DAILY 90 tablet 3   Continuous Blood Gluc Receiver (FREESTYLE LIBRE 2 READER) DEVI E11.69 Check blood sugar 4 times daily as directed (Patient not taking: Reported on 05/03/2023) 1 each 0   Continuous Glucose Sensor (FREESTYLE LIBRE 3 PLUS SENSOR) MISC Change sensor every 15 days. 6 each 1   cyclobenzaprine (FLEXERIL) 10 MG tablet TAKE ONE TABLET BY MOUTH EVERY 8 HOURS AS NEEDED FOR MUSCLE SPASMS 270 tablet 1   dicyclomine (BENTYL) 20 MG tablet TAKE ONE TABLET BY MOUTH BEFORE MEALS AND AT BEDTIME AS NEEDED FOR STOMACH CRAMPING 180 tablet 1   FETZIMA 80 MG CP24 TAKE ONE CAPSULE BY MOUTH EVERY EVENING 90 capsule 1   gabapentin (NEURONTIN) 400 MG  capsule TAKE 2 CAPSULES BY MOUTH 3 TIMES DAILY 180 capsule 1   gabapentin (NEURONTIN) 600 MG tablet Take 1 tablet (600 mg total) by mouth 3 (three) times daily for 14 days. (Patient not taking: Reported on 05/03/2023) 42 tablet 0   glucose blood test strip Use as instructed to check FBS daily. E11.40 100 each 12   Lancets (ONETOUCH DELICA PLUS LANCET30G) MISC USE TO check blood glucose 2-3 times daily AS DIRECTED 100 each 3   levothyroxine (SYNTHROID) 75 MCG tablet Take 1 tablet (75 mcg total) by mouth daily before breakfast. 90 tablet 3   lisdexamfetamine (VYVANSE) 70 MG capsule TAKE ONE CAPSULE BY MOUTH EVERY DAY 30 capsule 0   losartan (COZAAR) 50 MG tablet TAKE ONE TABLET BY MOUTH EVERY EVENING 90 tablet 1   Magnesium 500 MG CAPS Take 1 capsule (500 mg total) by mouth daily. 90 capsule 1   metoprolol succinate (TOPROL-XL) 25 MG 24 hr tablet Take 0.5 tablets (12.5 mg total) by mouth daily. 45 tablet 0   morphine (MS CONTIN) 30 MG 12 hr tablet Take 1 tablet (30 mg total) by mouth every 12 (twelve) hours. 60 tablet 0   Multiple Vitamin (MULTIVITAMIN WITH MINERALS) TABS tablet Take 1 tablet by mouth daily. Solar ray     nystatin (MYCOSTATIN/NYSTOP) powder Apply 1 Application topically 3 (three) times daily. 15 g 0   ondansetron (ZOFRAN) 4 MG tablet  Take 1 tablet (4 mg total) by mouth every 4 (four) hours as needed for nausea. 90 tablet 3   OVER THE COUNTER MEDICATION Take 1 tablet by mouth in the morning and at bedtime. Lutein for eyes     oxyCODONE (OXY IR/ROXICODONE) 5 MG immediate release tablet Take 1 tablet (5 mg total) by mouth every 4 (four) hours as needed for moderate pain (pain score 4-6). 10 tablet 0   pantoprazole (PROTONIX) 40 MG tablet TAKE ONE TABLET BY MOUTH TWICE DAILY 180 tablet 1   polyethylene glycol (MIRALAX / GLYCOLAX) 17 g packet Take 17 g by mouth daily as needed for moderate constipation.     potassium chloride (MICRO-K) 10 MEQ CR capsule Take 2 capsules (20 mEq total) by mouth 2 (two) times daily. 360 capsule 3   Probiotic Product (PROBIOTIC PO) Take 1 capsule by mouth in the morning. 3.2 billion cfu     prochlorperazine (COMPAZINE) 5 MG tablet TAKE ONE TABLET BY MOUTH every SIX hours AS NEEDED FOR NAUSEA AND VOMITING (Patient not taking: Reported on 05/03/2023) 30 tablet 1   ramelteon (ROZEREM) 8 MG tablet TAKE ONE TABLET BY MOUTH AT BEDTIME 90 tablet 1   rOPINIRole (REQUIP) 0.25 MG tablet TAKE ONE TABLET BY MOUTH AT BEDTIME 90 tablet 1   sulfamethoxazole-trimethoprim (BACTRIM DS) 800-160 MG tablet Take 1 tablet by mouth 2 (two) times daily for 14 days. 28 tablet 0   SURE COMFORT PEN NEEDLES 32G X 4 MM MISC Use new needle with each injection. 100 each 1   SYNJARDY 12.05-998 MG TABS TAKE ONE TABLET BY MOUTH TWICE DAILY 60 tablet 1   TRESIBA FLEXTOUCH 200 UNIT/ML FlexTouch Pen INJECT 60 UNITS SUBCUTANEOUSLY DAILY 9 mL 3   Vitamin D, Ergocalciferol, (DRISDOL) 1.25 MG (50000 UNIT) CAPS capsule TAKE ONE CAPSULE BY MOUTH EVERY FRIDAY AT 8AM 12 capsule 1   VRAYLAR 3 MG capsule TAKE ONE CAPSULE BY MOUTH EVERY DAY 90 capsule 0   Current Facility-Administered Medications on File Prior to Visit  Medication Dose Route Frequency Provider Last Rate Last Admin  sodium chloride flush (NS) 0.9 % injection 10 mL  10 mL Intracatheter  PRN Mosher, Kelli A, PA-C   10 mL at 02/19/21 1004   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 2 diabetes mellitus, with fat layer exposed (HCC) 03/27/2020   Gastritis    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 No longer has   Past Surgical History:  Procedure Laterality Date   ACHILLES TENDON LENGTHENING Left 04/09/2023   Procedure: LENGTHENING, TENDON, ACHILLES;  Surgeon: Reina Cara, DPM;  Location: [facility];  Service: Podiatry;  Laterality: Left;   AMPUTATION TOE Bilateral 03/04/2022   Procedure: AMPUTATION TOE RIGHT FOOT PARTIAL OR TOTAL GREAT TOE AND SECOND TOE, LEFT FOOT PARTIAL OR TOTAL GREAT TOE, SECOND AND THIRD TOE;  Surgeon: Evertt Hoe, DPM;  Location: [facility] OR;  Service: Podiatry;  Laterality: Bilateral;   AMPUTATION TOE Right 01/04/2023   Procedure: R 1ST TOE AMPUTATION, RIGHT 3RD TOE AMPUTATION;  Surgeon: Evertt Hoe,  DPM;  Location: [facility];  Service: Orthopedics/Podiatry;  Laterality: Right;   APPENDECTOMY     BACK SURGERY     x 3 lower disc   BILATERAL TOTAL MASTECTOMY WITH AXILLARY LYMPH NODE DISSECTION Bilateral 09/2019   CHOLECYSTECTOMY     nephrolithiasis     ROBOTIC ASSISTED TOTAL HYSTERECTOMY WITH BILATERAL SALPINGO OOPHERECTOMY Bilateral 06/14/2016   Procedure: ROBOTIC ASSISTED TOTAL HYSTERECTOMY WITH BILATERAL SALPINGO OOPHORECTOMY;  Surgeon: Andra Kava, MD;  Location: [facility];  Service: Gynecology;  Laterality: Bilateral;   TRANSMETATARSAL AMPUTATION Left 04/09/2023   Procedure: LEFT TRANSMETATARSAL AMPUTATION;  Surgeon: Reina Cara, DPM;  Location: [facility];  Service: Podiatry;  Laterality: Left;    Family History  Problem Relation Age of Onset   Breast cancer Mother        breast   CAD Father    Diabetes Father    Heart failure Father    Cancer Father        renal carcinoma   Kidney failure Father    Kidney cancer Father    Kidney cancer Brother 23       renal carcinoma.   Stroke Paternal Grandmother    Social History   Socioeconomic History   Marital status: Divorced  Spouse name: Not on file   Number of children: Not on file   Years of education: Not on file   Highest education level: Associate degree: academic program  Occupational History   Occupation: disabled  Tobacco Use   Smoking status: Never   Smokeless tobacco: Never  Vaping Use   Vaping status: Never Used  Substance and Sexual Activity   Alcohol use: No   Drug use: No   Sexual activity: Yes  Other Topics Concern   Not on file  Social History Narrative   wears sunscreen, brushes and flosses daily, see's dentist bi-annually, has smoke/carbon monoxide detectors, wears a seatbelt and practices gun safety   Social Drivers of Health   Financial Resource Strain: High Risk (01/21/2023)   Overall Financial Resource Strain (CARDIA)    Difficulty of Paying Living Expenses: Very hard  Food Insecurity: No Food  Insecurity (04/13/2023)   Hunger Vital Sign    Worried About Running Out of Food in the Last Year: Never true    Ran Out of Food in the Last Year: Never true  Recent Concern: Food Insecurity - Food Insecurity Present (04/08/2023)   Hunger Vital Sign    Worried About Running Out of Food in the Last Year: Sometimes true    Ran Out of Food in the Last Year: Never true  Transportation Needs: No Transportation Needs (04/13/2023)   PRAPARE - Administrator, Civil Service (Medical): No    Lack of Transportation (Non-Medical): No  Physical Activity: Inactive (01/21/2023)   Exercise Vital Sign    Days of Exercise per Week: 0 days    Minutes of Exercise per Session: 0 min  Stress: Stress Concern Present (01/21/2023)   [employer] of Occupational Health - Occupational Stress Questionnaire    Feeling of Stress : Very much  Social Connections: Moderately Isolated (01/21/2023)   Social Connection and Isolation Panel [NHANES]    Frequency of Communication with Friends and Family: More than three times a week    Frequency of Social Gatherings with Friends and Family: Never    Attends Religious Services: 1 to 4 times per year    Active Member of [employer] or Organizations: No    Attends Engineer, structural: Not on file    Marital Status: Divorced    Objective:  BP 106/66   Pulse 97   Temp (!) 97.5 F (36.4 C)   Ht 5\' 2"  (1.575 m)   Wt 206 lb 3.2 oz (93.5 kg)   LMP 06/13/2009   SpO2 98%   BMI 37.71 kg/m      05/03/2023    8:30 AM 05/03/2023    7:43 AM 04/12/2023    9:27 AM  BP/Weight  Systolic BP 106 106 110  Diastolic BP 66 66 72  Wt. (Lbs) 206 206.2   BMI 37.68 kg/m2 37.71 kg/m2     Physical Exam Vitals reviewed.  Constitutional:      Appearance: Normal appearance. She is obese.  Neck:     Vascular: No carotid bruit.  Cardiovascular:     Rate and Rhythm: Normal rate and regular rhythm.     Heart sounds: Normal heart sounds.  Pulmonary:     Effort:  Pulmonary effort is normal. No respiratory distress.     Breath sounds: Normal breath sounds.  Abdominal:     General: Abdomen is flat. Bowel sounds are normal.     Palpations: Abdomen is soft.     Tenderness: There is no abdominal  tenderness.  Neurological:     Mental Status: She is alert and oriented to person, place, and time.  Psychiatric:        Mood and Affect: Mood normal.        Behavior: Behavior normal.     No foot exam done as patient is seeing podiatry.   Lab Results  Component Value Date   WBC 6.7 05/03/2023   HGB 11.5 05/03/2023   HCT 37.2 05/03/2023   PLT 126 (L) 05/03/2023   GLUCOSE 117 (H) 05/03/2023   CHOL 147 05/03/2023   TRIG 156 (H) 05/03/2023   HDL 44 05/03/2023   LDLCALC 76 05/03/2023   ALT 12 05/03/2023   AST 20 05/03/2023   NA 140 05/03/2023   K 4.6 05/03/2023   CL 102 05/03/2023   CREATININE 1.06 (H) 05/03/2023   BUN 12 05/03/2023   CO2 21 05/03/2023   TSH 1.270 01/02/2023   INR 1.0 03/29/2023   HGBA1C 8.0 (H) 05/03/2023   MICROALBUR 30 10/09/2019      Assessment & Plan:    Acquired hypothyroidism Assessment & Plan: Previously well controlled Continue Synthroid at current dose  - Continue levothyroxine 75 mcg daily.    Chronic pain syndrome Assessment & Plan: The current medical regimen is fairly effective;  continue present plan and medications. Secondary to back pain. Continue morphine sulfate at current dose.      Class 2 severe obesity due to excess calories with serious comorbidity and body mass index (BMI) of 37.0 to 37.9 in adult [facility]) Assessment & Plan: Recommend continue to work on eating healthy diet. Unable to exercise. Comorbidity: diabetes.  Continue vyvanse and mounjaro.    Diabetic glomerulopathy (HCC) Assessment & Plan: At goal.  Blood glucose levels are elevated, with readings in the 160s and 170s. Recent hemoglobin A1c was 7.0, indicating suboptimal control. She was off Mounjaro before emergency  surgery, which may have contributed to the elevated glucose levels. No episodes of hypoglycemia reported. Current medications include Mounjaro, Synjardy, and insulin. Consideration to increase Mounjaro dosage as she is tolerating it well. Insulin before meals is a possibility, but increasing Mounjaro is preferred if tolerated. - Increase Mounjaro dosage if tolerated. - Perform blood work to assess current A1c and other relevant parameters. - Encourage healthy eating habits to improve glucose control.  Orders: -     Hemoglobin A1c -     Microalbumin / creatinine urine ratio  Essential hypertension, benign Assessment & Plan: Well controlled.  No changes to medicines. Continue Losartan potassium 50 mg take 1 tablet daily, Potassium Chloride 10 meq take 2 capsules BID Continue to work on eating a healthy diet and exercise.  Labs drawn today.    Orders: -     Comprehensive metabolic panel with GFR  Idiopathic thrombocytopenic purpura (ITP) (HCC) Assessment & Plan: Chronic. Check cbc.  Orders: -     CBC with Differential/Platelet  Mild recurrent major depression (HCC) Assessment & Plan: The current medical regimen is effective;  continue present plan and medications.  currently taking Fetzima 80 mg daily, Bupropion 300 mg 1 tablet daily, Buspirone 5 mg take 1 tablet three times a day.    Vitamin D insufficiency Assessment & Plan: Continue vitamin D replacement   Mixed hyperlipidemia Assessment & Plan: Well controlled.  No changes to medicines. Taking Atorvastatin 10 mg 1 tablet daily, lovaza 1 gm 2 capsules twice daily. Continue repatha. Continue to work on eating a healthy diet and exercise.  Labs drawn today.  Orders: -     Lipid panel  Uncomplicated opioid dependence (HCC) Assessment & Plan: Stable. On pain contract.    Chronic kidney disease (CKD) stage G2/A2, mildly decreased glomerular filtration rate (GFR) between 60-89 mL/min/1.73 square meter and albuminuria  creatinine ratio between 30-299 mg/g Assessment & Plan: Improved.       No orders of the defined types were placed in this encounter.   Orders Placed This Encounter  Procedures   CBC with Differential/Platelet   Comprehensive metabolic panel with GFR   Hemoglobin A1c   Lipid panel   Microalbumin / creatinine urine ratio     Follow-up: Return in about 3 months (around 08/02/2023) for chronic follow up.   I,Marla I Leal-Borjas,acting as a scribe for Mercy Stall, MD.,have documented all relevant documentation on the behalf of Mercy Stall, MD,as directed by  Mercy Stall, MD while in the presence of Mercy Stall, MD.   An After Visit Summary was printed and given to the patient.  Mercy Stall, MD [facility] [phone]

## 2023-05-02 NOTE — Telephone Encounter (Signed)
-----   Message from Barbaraann Share sent at 05/02/2023  7:26 AM EDT ----- Regarding: Home health orders Good morning,   I need to place new home health orders for the patient.  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  She needs home health dressing changes 3 times a week with Xeroform, 4 x 4 gauze, Kerlix, Ace wrap.  Thank you

## 2023-05-03 ENCOUNTER — Other Ambulatory Visit: Payer: Self-pay

## 2023-05-03 ENCOUNTER — Encounter: Payer: Self-pay | Admitting: Family Medicine

## 2023-05-03 ENCOUNTER — Ambulatory Visit (INDEPENDENT_AMBULATORY_CARE_PROVIDER_SITE_OTHER): Payer: PPO | Admitting: Family Medicine

## 2023-05-03 VITALS — BP 106/66 | HR 97 | Temp 97.5°F | Ht 62.0 in | Wt 206.2 lb

## 2023-05-03 DIAGNOSIS — F1120 Opioid dependence, uncomplicated: Secondary | ICD-10-CM | POA: Diagnosis not present

## 2023-05-03 DIAGNOSIS — N182 Chronic kidney disease, stage 2 (mild): Secondary | ICD-10-CM | POA: Diagnosis not present

## 2023-05-03 DIAGNOSIS — D693 Immune thrombocytopenic purpura: Secondary | ICD-10-CM

## 2023-05-03 DIAGNOSIS — F330 Major depressive disorder, recurrent, mild: Secondary | ICD-10-CM | POA: Diagnosis not present

## 2023-05-03 DIAGNOSIS — E66812 Obesity, class 2: Secondary | ICD-10-CM

## 2023-05-03 DIAGNOSIS — E782 Mixed hyperlipidemia: Secondary | ICD-10-CM | POA: Diagnosis not present

## 2023-05-03 DIAGNOSIS — E1121 Type 2 diabetes mellitus with diabetic nephropathy: Secondary | ICD-10-CM

## 2023-05-03 DIAGNOSIS — I10 Essential (primary) hypertension: Secondary | ICD-10-CM

## 2023-05-03 DIAGNOSIS — G894 Chronic pain syndrome: Secondary | ICD-10-CM

## 2023-05-03 DIAGNOSIS — Z6837 Body mass index (BMI) 37.0-37.9, adult: Secondary | ICD-10-CM | POA: Diagnosis not present

## 2023-05-03 DIAGNOSIS — E039 Hypothyroidism, unspecified: Secondary | ICD-10-CM | POA: Diagnosis not present

## 2023-05-03 DIAGNOSIS — E559 Vitamin D deficiency, unspecified: Secondary | ICD-10-CM | POA: Diagnosis not present

## 2023-05-03 LAB — COMPREHENSIVE METABOLIC PANEL WITH GFR
ALT: 12 IU/L (ref 0–32)
AST: 20 IU/L (ref 0–40)
Albumin: 4 g/dL (ref 3.8–4.9)
Alkaline Phosphatase: 138 IU/L — ABNORMAL HIGH (ref 44–121)
BUN/Creatinine Ratio: 11 (ref 9–23)
BUN: 12 mg/dL (ref 6–24)
Bilirubin Total: 0.4 mg/dL (ref 0.0–1.2)
CO2: 21 mmol/L (ref 20–29)
Calcium: 9.3 mg/dL (ref 8.7–10.2)
Chloride: 102 mmol/L (ref 96–106)
Creatinine, Ser: 1.06 mg/dL — ABNORMAL HIGH (ref 0.57–1.00)
Globulin, Total: 2.4 g/dL (ref 1.5–4.5)
Glucose: 117 mg/dL — ABNORMAL HIGH (ref 70–99)
Potassium: 4.6 mmol/L (ref 3.5–5.2)
Sodium: 140 mmol/L (ref 134–144)
Total Protein: 6.4 g/dL (ref 6.0–8.5)
eGFR: 62 mL/min/{1.73_m2} (ref >59)

## 2023-05-03 LAB — CBC WITH DIFFERENTIAL/PLATELET
Basophils Absolute: 0 10*3/uL (ref 0.0–0.2)
Basos: 0 %
EOS (ABSOLUTE): 0.2 10*3/uL (ref 0.0–0.4)
Eos: 3 %
Hematocrit: 37.2 % (ref 34.0–46.6)
Hemoglobin: 11.5 g/dL (ref 11.1–15.9)
Immature Grans (Abs): 0 10*3/uL (ref 0.0–0.1)
Immature Granulocytes: 0 %
Lymphocytes Absolute: 1.7 10*3/uL (ref 0.7–3.1)
Lymphs: 25 %
MCH: 26.5 pg — ABNORMAL LOW (ref 26.6–33.0)
MCHC: 30.9 g/dL — ABNORMAL LOW (ref 31.5–35.7)
MCV: 86 fL (ref 79–97)
Monocytes Absolute: 0.3 10*3/uL (ref 0.1–0.9)
Monocytes: 5 %
Neutrophils Absolute: 4.5 10*3/uL (ref 1.4–7.0)
Neutrophils: 67 %
Platelets: 126 10*3/uL — ABNORMAL LOW (ref 150–450)
RBC: 4.34 x10E6/uL (ref 3.77–5.28)
RDW: 15.2 % (ref 11.7–15.4)
WBC: 6.7 10*3/uL (ref 3.4–10.8)

## 2023-05-03 LAB — LIPID PANEL
Chol/HDL Ratio: 3.3 ratio (ref 0.0–4.4)
Cholesterol, Total: 147 mg/dL (ref 100–199)
HDL: 44 mg/dL (ref >39)
LDL Chol Calc (NIH): 76 mg/dL (ref 0–99)
Triglycerides: 156 mg/dL — ABNORMAL HIGH (ref 0–149)
VLDL Cholesterol Cal: 27 mg/dL (ref 5–40)

## 2023-05-03 LAB — HEMOGLOBIN A1C
Est. average glucose Bld gHb Est-mCnc: 183 mg/dL
Hgb A1c MFr Bld: 8 % — ABNORMAL HIGH (ref 4.8–5.6)

## 2023-05-03 NOTE — Patient Outreach (Signed)
 Transition of Care week 4  Visit Note  05/03/2023  Name: Linda [name] MRN: [id number]          DOB: [birth year]  Situation: Patient enrolled in [facility] 30-day program. Visit completed with patient by telephone.   Background: patient discharged from hospital 04/12/23 s/p Left trans metatarsal amputation and was seen by podiatry 05/01/23 now with new bactrim and increased home health skilled nurse visits. Patient also confirms appointment for Monday 05/08/23 with SW from [facility]. Patient has agreed for [facility] RN follow up and visit scheduled  Initial Transition Care Management Follow-up Telephone Call    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 2 diabetes mellitus, with fat layer exposed (HCC) 03/27/2020   Gastritis    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 No longer has    Assessment: Patient Reported Symptoms: sore foot after staple  removal and chronic back pain   Vitals:   05/03/23 0830  BP: 106/66  Pulse: 97  Temp: (!) 97.5 F (36.4 C)  SpO2: 98%    Medications Reviewed Today     Reviewed by Jessy Oto, RN (Registered Nurse) on 05/03/23 at 1428  Med List Status: <None>   Medication Order Taking? Sig Documenting Provider Last Dose Status Informant  acetaminophen (TYLENOL) 500 MG tablet [id number] Yes Take 500 mg by mouth every 6 (six) hours as needed for moderate pain or mild pain. [provider] Taking Active Self, Pharmacy Records  albuterol (VENTOLIN HFA) 108 (90 Base) MCG/ACT inhaler [id number] Yes INHALE TWO PUFFS BY MOUTH INTO LUNGS every SIX hours orn FOR WHEEZING AND/OR SHORTNESS OF Jari Favre, MD Taking Active Self, Pharmacy Records  amitriptyline (ELAVIL) 25 MG tablet [id number] Yes TAKE ONE TABLET BY MOUTH AT BEDTIME [name], Kirsten, MD Taking Active   atorvastatin (LIPITOR) 10 MG tablet [id number] Yes TAKE ONE TABLET BY MOUTH EVERY Renee Pain, Kirsten, MD Taking Active Self, Pharmacy Records  Blood Glucose Monitoring Suppl [facility] VERIO REFLECT) w/Device Andria Rhein [id number] Yes AS DIRECTED [provider] Taking Active Self, Pharmacy Records  Blood Pressure Monitoring [facility]) MISC [id number] Yes 1 each by Does not apply route daily in the afternoon. [name], Kirsten, MD Taking Active Self, Pharmacy Records  buPROPion (WELLBUTRIN XL) 300 MG 24  hr tablet [id number] Yes Take 1 tablet (300 mg total) by mouth every morning. CoxFritzi Mandes, MD Taking Active Self, Pharmacy Records  busPIRone (BUSPAR) 5 MG tablet [id number] Yes TAKE ONE TABLET BY MOUTH 3 TIMES DAILY [name], Kirsten, MD Taking Active Self, Pharmacy Records  Continuous Blood Gluc Receiver (FREESTYLE LIBRE 2 READER) DEVI [id number] No E11.69 Check blood sugar 4 times daily as directed  Patient not taking: Reported on 05/03/2023   [name], MD Not Taking Active Self, Pharmacy Records  Continuous Glucose Sensor (FREESTYLE  LIBRE 3 PLUS SENSOR) [place] [id number] Yes Change sensor every 15 days. CoxFritzi Mandes, MD Taking Active Self, Pharmacy Records  cyclobenzaprine (FLEXERIL) 10 MG tablet [id number] Yes TAKE ONE TABLET BY MOUTH EVERY 8 HOURS AS NEEDED FOR MUSCLE SPASMS [name], Kirsten, MD Taking Active Self, Pharmacy Records  dicyclomine (BENTYL) 20 MG tablet [id number] Yes TAKE ONE TABLET BY MOUTH BEFORE MEALS AND AT BEDTIME AS NEEDED FOR STOMACH CRAMPING [name], [name], MD Taking Active Self, Pharmacy Records           Med Note [facility], [place] A   Wed Apr 19, 2023  9:44 AM) Patient reports she has not been needed this in the last week  FETZIMA 80 MG CP24 [id number] Yes TAKE ONE CAPSULE BY MOUTH EVERY Renee Pain, Kirsten, MD Taking Active Self, Pharmacy Records  gabapentin (NEURONTIN) 400 MG capsule [id number] Yes TAKE 2 CAPSULES BY MOUTH 3 TIMES DAILY Sirivol, Mamatha, MD Taking Active   gabapentin (NEURONTIN) 600 MG tablet [id number] No Take 1 tablet (600 mg total) by mouth 3 (three) times daily for 14 days.  Patient not taking: Reported on 05/03/2023   Renne Crigler, FNP Not Taking Active Self, Pharmacy Records           Med Note [facility], [place] A   Wed Apr 19, 2023  9:52 AM) Patient states she was taking this prior to hospitalization and has in her pill packs  glucose blood test strip [id number] Yes Use as instructed to check FBS daily. E11.40 CoxFritzi Mandes, MD Taking Active Self, Pharmacy Records  Lancets [facility] [place] PLUS [place]) [place] [id number] Yes USE TO check blood glucose 2-3 times daily AS Vincent Gros, MD Taking Active Self, Pharmacy Records  levothyroxine (SYNTHROID) 75 MCG tablet [id number] Yes Take 1 tablet (75 mcg total) by mouth daily before breakfast. [name], MD Taking Active Self, Pharmacy Records  lisdexamfetamine (VYVANSE) 70 MG capsule [id number] Yes TAKE ONE CAPSULE BY MOUTH EVERY DAY [name], Kirsten, MD Taking Active   losartan (COZAAR) 50 MG tablet [id number] Yes TAKE ONE TABLET BY MOUTH EVERY  Colon Branch, MD Taking Active   Magnesium 500 MG CAPS [id number] Yes Take 1 capsule (500 mg total) by mouth daily. [name], Kirsten, MD Taking Active Self, Pharmacy Records  metoprolol succinate (TOPROL-XL) 25 MG 24 hr tablet [id number] Yes Take 0.5 tablets (12.5 mg total) by mouth daily. [name], Kirsten, MD Taking Active Self, Pharmacy Records           Med Note Lia Hopping, [facility] N   Sat Apr 08, 2023  9:16 AM) Pt states she takes half a tablet daily  morphine (MS CONTIN) 30 MG 12 hr tablet [id number] Yes Take 1 tablet (30 mg total) by mouth every 12 (twelve) hours. [name], MD Taking Active   MOUNJARO 7.5 MG/0.5ML Pen [id number] Yes INJECT 7.5 MG INTO THE SKIN EVERY WEEK [name], Kirsten, MD Taking Active Self, Pharmacy Records           Med Note (  Francine Graven N   Sat Apr 08, 2023  9:04 AM) saturdays  Multiple Vitamin (MULTIVITAMIN WITH MINERALS) TABS tablet [id number] Yes Take 1 tablet by mouth daily. Solar ray [provider] Taking Active Self, Pharmacy Records           Med Note Lenoria Farrier   Tue Mar 01, 2022  4:02 PM)    nystatin (MYCOSTATIN/NYSTOP) powder [id number] Yes Apply 1 Application topically 3 (three) times daily. Renne Crigler, FNP Taking Active Self, Pharmacy Records  ondansetron [facility]) 4 MG tablet [id number] Yes Take 1 tablet (4 mg total) by mouth every 4 (four) hours as needed for nausea. [name], MD Taking Active Self, Pharmacy Records           Med Note [facility], [place] A   Wed Apr 19, 2023 10:01 AM) Patient states she has only used once in the past week - takes as needed  OVER THE COUNTER MEDICATION [id number] Yes Take 1 tablet by mouth in the morning and at bedtime. Lutein for eyes [provider] Taking Active Self, Pharmacy Records  oxyCODONE (OXY IR/ROXICODONE) 5 MG immediate release tablet [id number] Yes Take 1 tablet (5 mg total) by mouth every 4 (four) hours as needed for moderate pain (pain score 4-6). Jerald Kief, MD Taking Active    pantoprazole (PROTONIX) 40 MG tablet [id number] Yes TAKE ONE TABLET BY MOUTH TWICE DAILY [name], Kirsten, MD Taking Active   polyethylene glycol (MIRALAX / GLYCOLAX) 17 g packet [id number] Yes Take 17 g by mouth daily as needed for moderate constipation. [provider] Taking Active Self, Pharmacy Records           Med Note [facility], Ralene Gasparyan A   Wed Apr 19, 2023 10:02 AM) Patient reports moving bowels - takes as needed  potassium chloride (MICRO-K) 10 MEQ CR capsule [id number] Yes Take 2 capsules (20 mEq total) by mouth 2 (two) times daily. [name], MD Taking Active Self, Pharmacy Records  Probiotic Product (PROBIOTIC PO) [id number] Yes Take 1 capsule by mouth in the morning. 3.2 billion cfu [provider] Taking Active Self, Pharmacy Records  prochlorperazine (COMPAZINE) 5 MG tablet [id number] No TAKE ONE TABLET BY MOUTH every SIX hours AS NEEDED FOR NAUSEA AND VOMITING  Patient not taking: Reported on 05/03/2023   [name], MD Not Taking Active Self, Pharmacy Records           Med Note [facility], [place] A   Wed Apr 19, 2023 10:03 AM) Patient states she has not needed "in a while"  ramelteon (ROZEREM) 8 MG tablet [id number] Yes TAKE ONE TABLET BY MOUTH AT BEDTIME [name], [name], MD Taking Active Self, Pharmacy Records  rOPINIRole (REQUIP) 0.25 MG tablet [id number] Yes TAKE ONE TABLET BY MOUTH AT BEDTIME [name], Kirsten, MD Taking Active Self, Pharmacy Records  sulfamethoxazole-trimethoprim (BACTRIM DS) 800-160 MG tablet [id number] Yes Take 1 tablet by mouth 2 (two) times daily for 14 days. Barbaraann Share, DPM Taking Active   SURE COMFORT PEN NEEDLES 32G X 4 MM MISC [id number] Yes Use new needle with each injection. [name], MD Taking Active   SYNJARDY 12.05-998 MG TABS [id number] Yes TAKE ONE TABLET BY MOUTH TWICE DAILY Marianne Sofia, PA-C Taking Active   TRESIBA FLEXTOUCH 200 UNIT/ML FlexTouch Pen [id number] Yes INJECT 60 UNITS SUBCUTANEOUSLY DAILY [name], Kirsten, MD Taking Active    Vitamin D, Ergocalciferol, (DRISDOL) 1.25 MG (50000 UNIT) CAPS capsule [id number] Yes TAKE ONE CAPSULE BY MOUTH EVERY FRIDAY AT Foye Deer, MD Taking  Active Self, Pharmacy Records  VRAYLAR 3 MG capsule [id number] Yes TAKE ONE CAPSULE BY MOUTH EVERY DAY [name], Kirsten, MD Taking Active Self, Pharmacy Records            Goals Addressed               This Visit's Progress     COMPLETED: TOC Care Plan - Patient will report no readmissions in the next 30 days (pt-stated)        Current Barriers:  Patient with Left trans metatarsal amputation now in wheelchair and adjusting to non-weight bearing status (04/19/23 patient seen by podiatry 04/17/23 and had to go to appointment without wheelchair - discussed non-weight bearing - Patient states her CM with [facility] is working on getting her wheelchair transport - states SW was referred by [facility]-(04/26/23 patient states she spoke with SW and someone from [facility] will be coming monthly (a Engineer, civil (consulting)) and states arrangements have been made for someone to come to her home to assist patient in her home with Allyn Kenner)  RNCM Clinical Goal(s):  Patient will work with the Care Management team over the next 30 days to address Transition of Care Barriers: Non weight bearing with recent amputation Patient will take all medications exactly as prescribed and will call provider for medication related questions as evidenced by patient report and health record Patient will demonstrate understanding of rationale for each prescribed medication as evidenced by patient report  Patient will attend all scheduled medical appointments Patient will recover from recent amputation free of preventable complications   Interventions: Evaluation of current treatment plan related to  self management and patient's adherence to plan as established by provider  Transitions of Care:  Goal on Track. Doctor Visits  - discussed the importance of doctor visits - (04/19/23 patient was seen  by podiatry - TOC RN will send message to Dr Sedalia Muta for possible hospital follow up virtually (04/26/23 patient reports Dr Sedalia Muta told her okay to follow with podiatry 4/7 - patient has 4/9 hospital follow up with Dr Sedalia Muta that she may need to cancel-states she will call to Dr Sedalia Muta to see if it can be rescheduled to the following week)  Contacted provider regarding question related to Doxycycline - Patient was prescribed this 3/10 and went to hospital 04/07/23 and is not sure if she was receiving this during hospitalization (medication is listed on discharge summary as well as Augmentin - (04/19/23 patient reports Augmentin completed - Per podiatrist note patient continue with doxycycline until gone) - 04/26/23 completed  Diabetes Interventions:  (Status:  Goal on Track) Short Term Goal Assessed patient's understanding of A1c goal: <6.5% Provided education to patient about basic DM disease process Reviewed medications with patient and discussed importance of medication adherence Discussed plans with patient for ongoing care management follow up and provided patient with direct contact information for care management team Assessed patient understanding of wound care and patient states  dressing is to remain placed until follow up with podiatrist 05/01/23 and patient will monitor for breakthrough drainage and report to MD  Lab Results  Component Value Date   HGBA1C 6.7 (H) 12/21/2022    Patient Goals/Self-Care Activities: Participate in Transition of Care Program/Attend [facility] scheduled calls Notify RN Care Manager of [facility] call rescheduling needs Take all medications as prescribed Attend all scheduled provider appointments Call insurance for possible assistance - Spoke with [facility] CM, Boris Lown - referred SW and they are working assistance in home and transportation assistance. Call PCP office  to schedule hospital follow up - patient did not call per 04/19/23 call - [facility] RN will send message to PCP to request virtual  visit (04/26/23 per record and patient report-she heard from Dr [name] office and states MD is okay with her following with podiatry but patient has 3 month follow currently scheduled with PCP 05/03/23 - may need to reschedule for following week)  Follow Up Plan:  Telephone follow up appointment with care management team member scheduled for:  05/03/23 2pm The patient has been provided with contact information for the care management team and has been advised to call with any health related questions or concerns.        VBCI Transitions of Care (TOC) Care Plan        Problems:  Recent Hospitalization for treatment of Left trans metatarsal amputation Slow healing wound with need for increased Home Health skilled nurse visits post podiatry appointment 05/01/23  Goal:  Over the next 30 days, the patient will not experience hospital readmission  Interventions:  Transitions of Care:  Goal on track:  Yes. Doctor Visits  - discussed the importance of doctor visits  Surgery (Left trans metatarsal amputation):  (Status: Goal on track:  Yes.) Short Term Goal Evaluation of current treatment plan related to Left trans metatarsal amputation surgery reviewed medications with patient and addressed questions - patient was started new on Bactrim 05/01/23 Patient following with podiatry and reports staples removed 05/01/23 - Home Health nursing visits increased to 3x/week  Patient Self Care Activities:  Attend all scheduled provider appointments Call pharmacy for medication refills 3-7 days in advance of running out of medications Call provider office for new concerns or questions  Notify RN Care Manager of [facility] call rescheduling needs Participate in Transition of Care Program/Attend TOC scheduled calls Take medications as prescribed    Plan:  Telephone follow up appointment with care management team member scheduled for:  05/11/23 10am The patient has been provided with contact information for the care management team  and has been advised to call with any health related questions or concerns.             Recommendation:   Home Health requests: Skilled Nursing  Follow Up Plan:   Telephone follow up appointment date/time:  05/11/23 10am  Hilbert Odor RN, CCM [place]  VBCI-Population Health RN Care Manager [phone]

## 2023-05-04 ENCOUNTER — Encounter: Payer: Self-pay | Admitting: Family Medicine

## 2023-05-04 ENCOUNTER — Other Ambulatory Visit: Payer: Self-pay | Admitting: Family Medicine

## 2023-05-04 DIAGNOSIS — E039 Hypothyroidism, unspecified: Secondary | ICD-10-CM

## 2023-05-04 MED ORDER — TIRZEPATIDE 10 MG/0.5ML ~~LOC~~ SOAJ: 10.0000 mg | SUBCUTANEOUS | 0 refills | Status: DC

## 2023-05-04 NOTE — Telephone Encounter (Signed)
 Patient to be reevaluated in am by home health care nurse. Recommend medihoney. [name]

## 2023-05-06 NOTE — Assessment & Plan Note (Signed)
 Control: not at goal. Recommend continue CGM. Recommend check feet daily. Recommend annual eye exams. Medicines: Recommend increase Trulicity 4.5 mg injection once weekly, Synjardy 12.5 mg/ 1000 mg 1 tablet twice daily, Tresiba inject 52 units daily.Continue to work on eating a healthy diet and exercise.  Labs drawn today.

## 2023-05-06 NOTE — Assessment & Plan Note (Signed)
 Previously well controlled Continue Synthroid at current dose

## 2023-05-06 NOTE — Assessment & Plan Note (Signed)
The current medical regimen is effective;  continue present plan and medications.  currently taking Fetzima 80 mg daily, Bupropion 300 mg 1 tablet daily, Buspirone 5 mg take 1 tablet three times a day.

## 2023-05-06 NOTE — Assessment & Plan Note (Signed)
 Well controlled.  No changes to medicines. Taking Atorvastatin 10 mg 1 tablet daily, lovaza 1 gm 2 capsules twice daily. Continue repatha. Continue to work on eating a healthy diet and exercise.  Labs drawn today.

## 2023-05-06 NOTE — Assessment & Plan Note (Signed)
Well controlled.  No changes to medicines. Continue Losartan potassium 50 mg take 1 tablet daily, Potassium Chloride 10 meq take 2 capsules BID Continue to work on eating a healthy diet and exercise.  Labs drawn today.

## 2023-05-06 NOTE — Assessment & Plan Note (Signed)
Continue vitamin D replacement.  

## 2023-05-06 NOTE — Assessment & Plan Note (Signed)
 The current medical regimen is fairly effective;  continue present plan and medications. Secondary to back pain. Continue morphine sulfate at current dose.

## 2023-05-06 NOTE — Assessment & Plan Note (Signed)
 Platelets have been stable at 126.  Mild increased risk of bleeding problems but overall should be fine.

## 2023-05-07 DIAGNOSIS — N182 Chronic kidney disease, stage 2 (mild): Secondary | ICD-10-CM | POA: Insufficient documentation

## 2023-05-07 NOTE — Assessment & Plan Note (Signed)
 Improved

## 2023-05-07 NOTE — Assessment & Plan Note (Signed)
 Stable. On pain contract.

## 2023-05-07 NOTE — Assessment & Plan Note (Signed)
 Recommend continue to work on eating healthy diet. Unable to exercise. Comorbidity: diabetes.  Continue vyvanse and mounjaro.

## 2023-05-08 ENCOUNTER — Ambulatory Visit: Payer: Self-pay

## 2023-05-08 ENCOUNTER — Telehealth: Payer: Self-pay | Admitting: Family Medicine

## 2023-05-08 NOTE — Telephone Encounter (Signed)
 [facility] [phone]

## 2023-05-08 NOTE — Telephone Encounter (Signed)
  Chief Complaint: fall Symptoms: hematoma on left elbow  Disposition: [] ED /[] Urgent Care (no appt availability in office) / [] Appointment(In office/virtual)/ []  [facility] [facility]/ [x] Home Care/ [] Refused Recommended Disposition /[]  Mobile Bus/ []  Follow-up with PCP Additional Notes: Deanna RN with [facility] called to notify of fall from yesterday. Pt was walking without walker, bent to pick something off floor and fell. Pt fell on left elbow, both knees, and hit head on cabinet. Pt has a scab and hematoma on left elbow. No other abnormalities. Pt did not seek care. Deanna stated pt was A/O x3 this morning. Pt denied headaches. Pt had toes on left foot amputated and receives [facility] services 3x/weekly. Deanna did not feel pt needs appt but did want to update PCP. Deanna said if any change the pt is great about calling HH and they will notify us  if any changes.          Copied from CRM [id number]. Topic: Clinical - Red Word Triage >> May 08, 2023  9:24 AM Elle L wrote: Red Word that prompted transfer to Nurse Triage: Nurse Deanna with Home Health, [phone], was calling to report that a patient fell yesterday and she has not gone to an Urgent Care or Emergency Room. Reason for Disposition  Small bruise is present  Answer Assessment - Initial Assessment Questions 1. MECHANISM: "How did the fall happen?"     Walking without walker  2. DOMESTIC VIOLENCE AND ELDER ABUSE SCREENING: "Did you fall because someone pushed you or tried to hurt you?" If Yes, ask: "Are you safe now?"     Na  3. ONSET: "When did the fall happen?" (e.g., minutes, hours, or days ago)     Yesterday  4. LOCATION: "What part of the body hit the ground?" (e.g., back, buttocks, head, hips, knees, hands, head, stomach)     Left elbow and both knes 5. INJURY: "Did you hurt (injure) yourself when you fell?" If Yes, ask: "What did you injure? Tell me more about this?" (e.g., body area; type of injury; pain  severity)"     Hematoma on left elbow 6. PAIN: "Is there any pain?" If Yes, ask: "How bad is the pain?" (e.g., Scale 1-10; or mild,  moderate, severe)   - NONE (0): No pain   - MILD (1-3): Doesn't interfere with normal activities    - MODERATE (4-7): Interferes with normal activities or awakens from sleep    - SEVERE (8-10): Excruciating pain, unable to do any normal activities      3/10 7. SIZE: For cuts, bruises, or swelling, ask: "How large is it?" (e.g., inches or centimeters)      Scab/hematoma  on left elbow  9. OTHER SYMPTOMS: "Do you have any other symptoms?" (e.g., dizziness, fever, weakness; new onset or worsening).   denies 10. CAUSE: "What do you think caused the fall (or falling)?" (e.g., tripped, dizzy spell)       Toes amputated on left foot.  Protocols used: Falls and [facility]

## 2023-05-11 ENCOUNTER — Telehealth: Payer: Self-pay

## 2023-05-11 DIAGNOSIS — E11628 Type 2 diabetes mellitus with other skin complications: Secondary | ICD-10-CM

## 2023-05-11 NOTE — Transitions of Care (Post Inpatient/ED Visit) (Signed)
 Care Management  Transitions of Care Program Transitions of Care Post-discharge Week 5  05/11/2023 Name: Linda [name] MRN: [id number] DOB: [birth year]  Subjective: Linda [name] is a 53 y.o. year old female who is a primary care patient of [name], Kirsten, MD. The Care Management team was unable to reach the patient by phone to assess and address transitions of care needs.   Plan: Additional outreach attempts will be made to reach the patient enrolled in the [facility] Program (Post Inpatient/ED Visit).  Tonia Frankel RN, CCM [name]  VBCI-Population Health RN Care Manager [phone]

## 2023-05-11 NOTE — Transitions of Care (Post Inpatient/ED Visit) (Signed)
 Transition of Care  Week 5 - TOC Closure Note  Visit Note  05/11/2023  Name: Linda [name] MRN: [id number]          DOB: 08/19/1970  Situation: Patient enrolled in [facility] 30-day program. Visit completed with patient by telephone.   Background: patient discharged from hospital 04/12/23 s/p Left trans metatarsal amputation and was seen by podiatry 05/01/23 now with new bactrim and increased home health skilled nurse visits. Patient also confirms appointment for Monday 05/08/23 with SW from [facility] was completed and has follow up appointment with podiatry, Dr Jamse Arn 05/15/23 and continues with [facility].  Patient has agreed for [facility] program closure with referral for CCM    Initial Transition Care Management Follow-up Telephone Call    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 2 diabetes mellitus, with fat layer exposed (HCC) 03/27/2020   Gastritis    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 No longer has    Assessment: Patient Reported Symptoms: Cognitive        Neurological   Neurological Management Strategies: Medication therapy Neurological Self-Management Outcome: 4 (good) Neurological Comment: patient takes gabapentin  HEENT HEENT Symptoms Reported: Change or loss of hearing, Other: Other HEENT Symptoms/Conditions: patient reports occassional difficulty hearing - plans to discuss with PCP      Cardiovascular   Cardiovascular Conditions: High blood cholesterol, Hypertension (patient states she feels medications are working well to control) Cardiovascular Management Strategies: Medication therapy, Diet modification Weight: 206 lb (93.4 kg) Cardiovascular Self-Management Outcome: 4 (good)  Respiratory Respiratory Symptoms Reported: No symptoms reported    Endocrine Patient reports the following symptoms related to hypoglycemia or hyperglycemia : Weakness or fatigue Is patient diabetic?: Yes Is patient checking blood sugars at home?: Yes Endocrine Conditions: Diabetes Endocrine Management Strategies: Medication therapy Endocrine Comment: Educated provided on management of diabetes and checking sugar  Gastrointestinal Gastrointestinal Symptoms Reported: No symptoms reported Gastrointestinal Conditions: Reflux/heartburn (patient states controlled with medication)    Genitourinary      Integumentary Integumentary Symptoms Reported: Wound Additional Integumentary Details: patient reports pressure sore to buttocks being managed by Home Health - states SN said it's looking better Skin Conditions: Pressure injury, Wound (patient states foot continues to heal and has next  appointment with podiatry, Dr Jamse Arn 05/15/23) Skin Management Strategies: Dressing changes (discussed position changes)  Musculoskeletal          Psychosocial           Vitals:   05/11/23 1241  BP: 106/66  Pulse: 97  Temp: (!)  97.5 F (36.4 C)  SpO2: 98%    Medications Reviewed Today     Reviewed by Jessy Oto, RN (Registered Nurse) on 05/11/23 at 1310  Med List Status: <None>   Medication Order Taking? Sig Documenting Provider Last Dose Status Informant  acetaminophen (TYLENOL) 500 MG tablet [id number] Yes Take 500 mg by mouth every 6 (six) hours as needed for moderate pain or mild pain. [provider] Taking Active Self, Pharmacy Records  albuterol (VENTOLIN HFA) 108 (90 Base) MCG/ACT inhaler [id number] Yes INHALE TWO PUFFS BY MOUTH INTO LUNGS every SIX hours orn FOR WHEEZING AND/OR SHORTNESS OF Jari Favre, MD Taking Active Self, Pharmacy Records  amitriptyline (ELAVIL) 25 MG tablet [id number] Yes TAKE ONE TABLET BY MOUTH AT BEDTIME [name], Kirsten, MD Taking Active   atorvastatin (LIPITOR) 10 MG tablet [id number] Yes TAKE ONE TABLET BY MOUTH EVERY Renee Pain, Kirsten, MD Taking Active Self, Pharmacy Records  Blood Glucose Monitoring Suppl [facility] VERIO REFLECT) w/Device Andria Rhein [id number] Yes AS DIRECTED [provider] Taking Active Self, Pharmacy Records  Blood Pressure Monitoring [facility]) MISC [id number] Yes 1 each by Does not apply route daily in the afternoon. [name], Kirsten, MD Taking Active Self, Pharmacy Records  buPROPion (WELLBUTRIN XL) 300 MG 24 hr tablet [id number] Yes Take 1 tablet (300 mg total) by mouth every morning. [name], MD Taking Active Self, Pharmacy Records  busPIRone (BUSPAR) 5 MG tablet [id number] Yes TAKE ONE TABLET BY MOUTH 3 TIMES DAILY [name], Kirsten, MD Taking Active Self, Pharmacy Records  Continuous Blood Gluc Receiver (FREESTYLE LIBRE 2 READER) Hardie Pulley [id number] Yes E11.69 Check blood sugar 4 times daily as directed [name], [name], MD Taking Active Self, Pharmacy Records  Continuous Glucose Sensor (FREESTYLE LIBRE 3 PLUS SENSOR) [place] [id number] Yes Change sensor every 15 days. CoxFritzi Mandes, MD Taking Active Self, Pharmacy Records  cyclobenzaprine  (FLEXERIL) 10 MG tablet [id number] Yes TAKE ONE TABLET BY MOUTH EVERY 8 HOURS AS NEEDED FOR MUSCLE SPASMS [name], Kirsten, MD Taking Active Self, Pharmacy Records  dicyclomine (BENTYL) 20 MG tablet [id number] Yes TAKE ONE TABLET BY MOUTH BEFORE MEALS AND AT BEDTIME AS NEEDED FOR STOMACH CRAMPING [name], [name], MD Taking Active Self, Pharmacy Records           Med Note [facility], [place] A   Wed Apr 19, 2023  9:44 AM) Patient reports she has not been needed this in the last week  FETZIMA 80 MG CP24 [id number] Yes TAKE ONE CAPSULE BY MOUTH EVERY Renee Pain, Kirsten, MD Taking Active Self, Pharmacy Records  gabapentin (NEURONTIN) 400 MG capsule [id number] Yes TAKE 2 CAPSULES BY MOUTH 3 TIMES DAILY Sirivol, Mamatha, MD Taking Active   gabapentin (NEURONTIN) 600 MG tablet [id number]  Take 1 tablet (600 mg total) by mouth 3 (three) times daily for 14 days.  Patient not taking: Reported on 05/03/2023   Renne Crigler, FNP  Expired 05/05/23 2359 Self, Pharmacy Records           Med Note [facility], Santina Trillo A   Wed Apr 19, 2023  9:52 AM) Patient states she was taking this prior to hospitalization and has in her pill packs  glucose blood test strip [id number] Yes Use as instructed to  check FBS daily. E11.40 CoxBurleigh Carp, MD Taking Active Self, Pharmacy Records  Lancets [facility] Jewelene Morton PLUS [place]) [place] [id number] Yes USE TO check blood glucose 2-3 times daily AS Henderson Lock, MD Taking Active Self, Pharmacy Records  levothyroxine (SYNTHROID) 75 MCG tablet [id number] Yes Take 1 tablet (75 mcg total) by mouth daily before breakfast. Mercy Stall, MD Taking Active Self, Pharmacy Records  lisdexamfetamine (VYVANSE) 70 MG capsule [id number] Yes TAKE ONE CAPSULE BY MOUTH EVERY DAY [name], Kirsten, MD Taking Active   losartan (COZAAR) 50 MG tablet [id number] Yes TAKE ONE TABLET BY MOUTH EVERY Fleeta Hull, MD Taking Active   Magnesium 500 MG CAPS [id number] Yes Take 1 capsule (500 mg total) by mouth daily. [name], Kirsten,  MD Taking Active Self, Pharmacy Records  metoprolol succinate (TOPROL-XL) 25 MG 24 hr tablet [id number] Yes Take 0.5 tablets (12.5 mg total) by mouth daily. [name], Kirsten, MD Taking Active Self, Pharmacy Records           Med Note Desiree Florence, [facility] N   Sat Apr 08, 2023  9:16 AM) Pt states she takes half a tablet daily  morphine (MS CONTIN) 30 MG 12 hr tablet [id number] Yes Take 1 tablet (30 mg total) by mouth every 12 (twelve) hours. [name], Kirsten, MD Taking Active   Multiple Vitamin (MULTIVITAMIN WITH MINERALS) TABS tablet [id number] Yes Take 1 tablet by mouth daily. Solar ray [provider] Taking Active Self, Pharmacy Records           Med Note [place]   Tue Mar 01, 2022  4:02 PM)    nystatin (MYCOSTATIN/NYSTOP) powder [id number] Yes Apply 1 Application topically 3 (three) times daily. Janece Means, FNP Taking Active Self, Pharmacy Records  ondansetron (ZOFRAN) 4 MG tablet [id number] No Take 1 tablet (4 mg total) by mouth every 4 (four) hours as needed for nausea.  Patient not taking: Reported on 05/11/2023   Mercy Stall, MD Not Taking Active Self, Pharmacy Records           Med Note [facility], [place] A   Wed Apr 19, 2023 10:01 AM) Patient states she has only used once in the past week - takes as needed  OVER THE COUNTER MEDICATION [id number] Yes Take 1 tablet by mouth in the morning and at bedtime. Lutein for eyes [provider] Taking Active Self, Pharmacy Records  oxyCODONE (OXY IR/ROXICODONE) 5 MG immediate release tablet [id number] Yes Take 1 tablet (5 mg total) by mouth every 4 (four) hours as needed for moderate pain (pain score 4-6). Oral Billings, MD Taking Active   pantoprazole (PROTONIX) 40 MG tablet [id number] Yes TAKE ONE TABLET BY MOUTH TWICE DAILY [name], Kirsten, MD Taking Active   polyethylene glycol (MIRALAX / GLYCOLAX) 17 g packet [id number] Yes Take 17 g by mouth daily as needed for moderate constipation. [provider] Taking Active Self,  Pharmacy Records           Med Note [facility], Aleta Man[place] A   Wed Apr 19, 2023 10:02 AM) Patient reports moving bowels - takes as needed  potassium chloride (MICRO-K) 10 MEQ CR capsule [id number] Yes Take 2 capsules (20 mEq total) by mouth 2 (two) times daily. Mercy Stall, MD Taking Active Self, Pharmacy Records  Probiotic Product (PROBIOTIC PO) [id number] Yes Take 1 capsule by mouth in the morning. 3.2 billion cfu [provider] Taking Active Self, Pharmacy Records  prochlorperazine (COMPAZINE) 5 MG tablet [id number] No TAKE ONE TABLET BY MOUTH every SIX hours AS NEEDED  FOR NAUSEA AND VOMITING  Patient not taking: Reported on 05/11/2023   [name], MD Not Taking Active Self, Pharmacy Records           Med Note [facility], [place] A   Wed Apr 19, 2023 10:03 AM) Patient states she has not needed "in a while"  ramelteon (ROZEREM) 8 MG tablet [id number] Yes TAKE ONE TABLET BY MOUTH AT BEDTIME [name], [name], MD Taking Active Self, Pharmacy Records  rOPINIRole (REQUIP) 0.25 MG tablet [id number] Yes TAKE ONE TABLET BY MOUTH AT BEDTIME [name], Kirsten, MD Taking Active Self, Pharmacy Records  sulfamethoxazole-trimethoprim (BACTRIM DS) 800-160 MG tablet [id number] Yes Take 1 tablet by mouth 2 (two) times daily for 14 days. Barbaraann Share, DPM Taking Active   SURE COMFORT PEN NEEDLES 32G X 4 MM MISC [id number] Yes Use new needle with each injection. [name], MD Taking Active   SYNJARDY 12.05-998 MG TABS [id number] Yes TAKE ONE TABLET BY MOUTH TWICE DAILY Marianne Sofia, PA-C Taking Active   tirzepatide [facility]) 10 MG/0.5ML Pen [id number] Yes Inject 10 mg into the skin once a week. [name], Kirsten, MD Taking Active            Med Note [facility], [place] A   Thu May 11, 2023  1:10 PM) Patient states she is completing prior dose 7.5mg  and will start 10mg   TRESIBA FLEXTOUCH 200 UNIT/ML FlexTouch Pen [id number] Yes INJECT 60 UNITS SUBCUTANEOUSLY DAILY [name], Kirsten, MD Taking Active   Vitamin D, Ergocalciferol, (DRISDOL)  1.25 MG (50000 UNIT) CAPS capsule [id number] Yes TAKE ONE CAPSULE BY MOUTH EVERY FRIDAY AT Foye Deer, MD Taking Active Self, Pharmacy Records  VRAYLAR 3 MG capsule [id number] Yes TAKE ONE CAPSULE BY MOUTH EVERY DAY [name], Kirsten, MD Taking Active Self, Pharmacy Records            Goals Addressed             This Visit's Progress    COMPLETED: VBCI Transitions of Care (TOC) Care Plan       Problems: [facility] Program closure with transfer to CCM - goals met Recent Hospitalization for treatment of Left trans metatarsal amputation Slow healing wound with need for increased Home Health skilled nurse visits post podiatry appointment 05/01/23   Goal: [facility] Program closure with transfer to CCM - goals met Over the next 30 days, the patient will not experience hospital readmission  Interventions: [facility] Program closure with transfer to CCM - goals met Transitions of Care:  Goal Met. Doctor Visits  - discussed the importance of doctor visits  Surgery (Left trans metatarsal amputation):  (Status: Goal Met.) Short Term Goal Evaluation of current treatment plan related to Left trans metatarsal amputation surgery reviewed medications with patient and addressed questions - patient was started new on Bactrim 05/01/23 - continues  Patient following with podiatry and reports staples removed 05/01/23 - Home Health nursing visits increased to 3x/week - patient has podiatry follow up visit 05/15/23  Patient Self Care Activities:  Attend all scheduled provider appointments Call pharmacy for medication refills 3-7 days in advance of running out of medications Call provider office for new concerns or questions  Notify RN Care Manager of TOC call rescheduling needs Participate in Transition of Care Program/Attend TOC scheduled calls Take medications as prescribed    Plan:  [facility] Program closure with transfer to CCM - goals met The patient has been provided with contact information for the care management team and  has been advised to call with any health  related questions or concerns.             Recommendation:   Referral to: CCM  Follow Up Plan:   Closing From:  Transitions of Care Program  Tonia Frankel RN, CCM [facility]  VBC[facility]-Population Health RN Care Manager [phone]

## 2023-05-13 DIAGNOSIS — M86672 Other chronic osteomyelitis, left ankle and foot: Secondary | ICD-10-CM | POA: Diagnosis not present

## 2023-05-13 DIAGNOSIS — E118 Type 2 diabetes mellitus with unspecified complications: Secondary | ICD-10-CM | POA: Diagnosis not present

## 2023-05-13 DIAGNOSIS — B372 Candidiasis of skin and nail: Secondary | ICD-10-CM | POA: Diagnosis not present

## 2023-05-13 DIAGNOSIS — Z89432 Acquired absence of left foot: Secondary | ICD-10-CM | POA: Diagnosis not present

## 2023-05-13 DIAGNOSIS — R252 Cramp and spasm: Secondary | ICD-10-CM | POA: Diagnosis not present

## 2023-05-15 ENCOUNTER — Encounter: Payer: Self-pay | Admitting: Podiatry

## 2023-05-15 ENCOUNTER — Ambulatory Visit (INDEPENDENT_AMBULATORY_CARE_PROVIDER_SITE_OTHER): Admitting: Podiatry

## 2023-05-15 DIAGNOSIS — L97422 Non-pressure chronic ulcer of left heel and midfoot with fat layer exposed: Secondary | ICD-10-CM

## 2023-05-15 NOTE — Progress Notes (Signed)
 Subjective:  Patient ID: Linda [name], female    DOB: 12/02/1970,  MRN: [id number]  Chief Complaint  Patient presents with   Post op    Post op today for left foot. Stitches still intact, incsion is healing, but there is an area laterally that is raw. Last A1c 8, last week.     DOS: 04/09/2023 Procedure: Left foot TMA and TAL  53 y.o. female returns for post-op check #4.  She presents in surgical shoe.  She states that due to history of back surgeries the walking boot is too heavy for her.  She reports that she has been unable to stay nonweightbearing though she does limit her activity is much as possible. Reports working with PT due to balance issues.  Review of Systems: Negative except as noted in the HPI. Denies N/V/F/Ch.  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 No longer has    Current Outpatient Medications:    acetaminophen  (TYLENOL ) 500 MG tablet, Take 500 mg by mouth every 6 (six) hours as needed for moderate pain or mild pain., Disp: , Rfl:    albuterol  (VENTOLIN  HFA) 108 (90 Base) MCG/ACT inhaler, INHALE TWO PUFFS BY MOUTH INTO LUNGS every SIX hours orn FOR WHEEZING AND/OR SHORTNESS OF BREATH, Disp: 8.5 g, Rfl: 1   amitriptyline  (ELAVIL ) 25 MG tablet, TAKE ONE TABLET BY MOUTH AT BEDTIME, Disp: 30 tablet, Rfl: 1   atorvastatin  (LIPITOR) 10 MG tablet, TAKE ONE TABLET BY MOUTH EVERY EVENING, Disp: 90 tablet, Rfl: 1   Blood Glucose Monitoring Suppl (ONETOUCH VERIO REFLECT) w/Device KIT, AS DIRECTED, Disp: , Rfl:    Blood Pressure Monitoring (SPHYGMOMANOMETER) MISC, 1 each by Does not apply route daily in the afternoon., Disp: 1 each, Rfl: 0   buPROPion  (WELLBUTRIN  XL) 300 MG 24 hr tablet, Take 1 tablet (300 mg total) by mouth every morning., Disp: 90 tablet, Rfl: 3   busPIRone  (BUSPAR ) 5 MG tablet, TAKE ONE TABLET BY MOUTH 3 TIMES DAILY, Disp: 90 tablet, Rfl: 3   Continuous Blood Gluc Receiver (FREESTYLE LIBRE 2 READER) DEVI, E11.69 Check blood sugar 4 times daily as  directed, Disp: 1 each, Rfl: 0   Continuous Glucose Sensor (FREESTYLE LIBRE 3 PLUS SENSOR) MISC, Change sensor every 15 days., Disp: 6 each, Rfl: 1   cyclobenzaprine  (FLEXERIL ) 10 MG tablet, TAKE ONE TABLET BY MOUTH EVERY 8 HOURS AS NEEDED FOR MUSCLE SPASMS, Disp: 270 tablet, Rfl: 1   dicyclomine  (BENTYL ) 20 MG tablet, TAKE ONE TABLET BY MOUTH BEFORE MEALS AND AT BEDTIME AS NEEDED FOR STOMACH CRAMPING, Disp: 180 tablet, Rfl: 1   FETZIMA  80 MG CP24, TAKE ONE CAPSULE BY MOUTH EVERY EVENING, Disp: 90 capsule, Rfl: 1   gabapentin  (NEURONTIN ) 400 MG capsule, TAKE 2 CAPSULES BY MOUTH 3 TIMES DAILY, Disp: 180 capsule, Rfl: 1   glucose blood test strip, Use as instructed to check FBS daily. E11.40, Disp: 100 each, Rfl: 12   Lancets (ONETOUCH DELICA  PLUS LANCET30G) MISC, USE TO check blood glucose 2-3 times daily AS DIRECTED, Disp: 100 each, Rfl: 3   levothyroxine  (SYNTHROID ) 75 MCG tablet, Take 1 tablet (75 mcg total) by mouth daily before breakfast., Disp: 90 tablet, Rfl: 3   lisdexamfetamine (VYVANSE ) 70 MG capsule, TAKE ONE CAPSULE BY MOUTH EVERY DAY, Disp: 30 capsule, Rfl: 0   losartan  (COZAAR ) 50 MG tablet, TAKE ONE TABLET BY MOUTH EVERY EVENING, Disp: 90 tablet, Rfl: 1   Magnesium  500 MG CAPS, Take 1 capsule (500 mg total) by mouth daily., Disp: 90 capsule, Rfl: 1   metoprolol  succinate (TOPROL -XL) 25 MG 24 hr tablet, Take 0.5 tablets (12.5 mg total) by mouth daily., Disp: 45 tablet, Rfl: 0   morphine  (MS CONTIN ) 30 MG 12 hr tablet, Take 1 tablet (30 mg total) by mouth every 12 (twelve) hours., Disp: 60 tablet, Rfl: 0   Multiple Vitamin (MULTIVITAMIN WITH MINERALS) TABS tablet, Take 1 tablet by mouth daily. Solar ray, Disp: , Rfl:    nystatin  (MYCOSTATIN /NYSTOP ) powder, Apply 1 Application topically 3 (three) times daily., Disp: 15 g, Rfl: 0   ondansetron  (ZOFRAN ) 4 MG tablet, Take 1 tablet (4 mg total) by mouth every 4 (four) hours as needed for nausea., Disp: 90 tablet, Rfl: 3   OVER THE COUNTER MEDICATION, Take 1 tablet by mouth in the morning and at bedtime. Lutein for eyes, Disp: , Rfl:    oxyCODONE  (OXY IR/ROXICODONE ) 5 MG immediate release tablet, Take 1 tablet (5 mg total) by mouth every 4 (four) hours as needed for moderate pain (pain score 4-6)., Disp: 10 tablet, Rfl: 0   pantoprazole  (PROTONIX ) 40 MG tablet, TAKE ONE TABLET BY MOUTH TWICE DAILY, Disp: 180 tablet, Rfl: 1   polyethylene glycol (MIRALAX  / GLYCOLAX ) 17 g packet, Take 17 g by mouth daily as needed for moderate constipation., Disp: , Rfl:    potassium chloride  (MICRO-K ) 10 MEQ CR capsule, Take 2 capsules (20 mEq total) by mouth 2 (two) times daily., Disp: 360 capsule, Rfl: 3   Probiotic Product (PROBIOTIC PO), Take 1 capsule by mouth in the morning. 3.2 billion cfu,  Disp: , Rfl:    prochlorperazine  (COMPAZINE ) 5 MG tablet, TAKE ONE TABLET BY MOUTH every SIX hours AS NEEDED FOR NAUSEA AND VOMITING, Disp: 30 tablet, Rfl: 1   ramelteon  (ROZEREM ) 8 MG tablet, TAKE ONE TABLET BY MOUTH AT BEDTIME, Disp: 90 tablet, Rfl: 1   rOPINIRole  (REQUIP ) 0.25 MG tablet, TAKE ONE TABLET BY MOUTH AT BEDTIME, Disp: 90 tablet, Rfl: 1   sulfamethoxazole -trimethoprim  (BACTRIM  DS) 800-160 MG tablet, Take 1 tablet by mouth 2 (two) times daily for 14 days., Disp: 28 tablet, Rfl: 0  SURE COMFORT PEN NEEDLES 32G X 4 MM MISC, Use new needle with each injection., Disp: 100 each, Rfl: 1   SYNJARDY  12.05-998 MG TABS, TAKE ONE TABLET BY MOUTH TWICE DAILY, Disp: 60 tablet, Rfl: 1   tirzepatide  (MOUNJARO ) 10 MG/0.5ML Pen, Inject 10 mg into the skin once a week., Disp: 6 mL, Rfl: 0   TRESIBA  FLEXTOUCH 200 UNIT/ML FlexTouch Pen, INJECT 60 UNITS SUBCUTANEOUSLY DAILY, Disp: 9 mL, Rfl: 3   Vitamin D , Ergocalciferol , (DRISDOL ) 1.25 MG (50000 UNIT) CAPS capsule, TAKE ONE CAPSULE BY MOUTH EVERY FRIDAY AT 8AM, Disp: 12 capsule, Rfl: 1   VRAYLAR  3 MG capsule, TAKE ONE CAPSULE BY MOUTH EVERY DAY, Disp: 90 capsule, Rfl: 0   gabapentin  (NEURONTIN ) 600 MG tablet, Take 1 tablet (600 mg total) by mouth 3 (three) times daily for 14 days. (Patient not taking: Reported on 05/03/2023), Disp: 42 tablet, Rfl: 0 No current facility-administered medications for this visit.  Facility-Administered Medications Ordered in Other Visits:    sodium chloride  flush (NS) 0.9 % injection 10 mL, 10 mL, Intracatheter, PRN, Mosher, Kelli A, PA-C, 10 mL at 02/19/21 1004  Social History   Tobacco Use  Smoking Status Never  Smokeless Tobacco Never    Allergies  Allergen Reactions   Codeine Shortness Of Breath and Other (See Comments)    Other reaction(s): SHOB    Amoxicillin -Pot Clavulanate Diarrhea   Celebrex [Celecoxib] Other (See Comments)    Unknown reaction   Propranolol Other (See Comments)    Other reaction(s):  Unknown   Propranolol Hcl Other (See Comments)    Unknown reaction  ask   Simvastatin Other (See Comments)    Other reaction(s): Unknown   Vytorin [Ezetimibe-Simvastatin] Other (See Comments)    Unknown reaction  Patient is not aware of an allergy to this medication, ask   Zetia [Ezetimibe] Other (See Comments)    Other reaction(s): Unknown   Objective:  There were no vitals filed for this visit. There is no height or weight on file to calculate BMI. Constitutional Well developed. Well nourished.  Vascular Foot warm and well perfused.  Palpable pedal pulses Capillary refill intact to TMA flap.  There is some erythema present of the forefoot into the posterior heel at TAL sites.  There is mild left lower extremity edema.  Neurologic Normal speech. Oriented to person, place, and time. Protective sensation intact  Dermatologic Lateral aspect of TMA incision there is area of superficial dehiscence about 1.5 cm in width with fat layer exposed. No significant drainage here.  Wound bed fibro granular with overlying bioburden noted.  Wound does not probe deep.  Staples were removed at last visit, remaining sutures have been intact.  Remainder of incision appears well-healed.  Posterior TMA stab incisions appear well-healed.  Orthopedic: Minimal tenderness to palpation noted about the surgical site.  Hammertoe contractures of right 4th and 5th toe, no ulceration appears stable.     Radiographs: Left foot 3 views 05/01/2023 Surgical changes status post left foot transmetatarsal amputation noted. Stable appearing in AP and Lateral view. Possible slight resorption at osteotomy sites vs previous with some calcification distally, unlcear if stress reaction due to continued wb throughout postoperative course vs possible OM. No soft tissue emphysema  Assessment:   1. Ulcer of left midfoot with fat layer exposed (HCC)    Plan:  Patient was evaluated and treated and all questions answered.  S/p  foot surgery left transmetatarsal amputation and tendo Achilles lengthening -Remaining sutures removed without incident.  The  remainder of the incision appears well-healed -The area of dehiscence lateral aspect of the incision appears stable and fairly superficial.  Bioburden was debrided using #15 blade.  Aquacel Ag and bordered foam dressing applied -Sending home supplies for dressing changes every 2 to 3 days for Aquacel Ag and bordered foam.  May cleanse the wound with wound cleanser. - Patient may begin to put more weight on the operative extremity though advised limited stance and gait is much as possible to facilitate wound healing. - Cellulitis from previous appears improved. -Will update home health orders to have dressings changed 3x a week as detailed above. - Monitor for signs of worsening infection.  Return in about 3 weeks (around 06/05/2023) for Ulcer Check.

## 2023-05-16 ENCOUNTER — Other Ambulatory Visit: Payer: Self-pay | Admitting: Family Medicine

## 2023-05-16 DIAGNOSIS — F50819 Binge eating disorder, unspecified: Secondary | ICD-10-CM

## 2023-05-16 DIAGNOSIS — R000 Tachycardia, unspecified: Secondary | ICD-10-CM

## 2023-05-16 DIAGNOSIS — K7460 Unspecified cirrhosis of liver: Secondary | ICD-10-CM

## 2023-05-16 DIAGNOSIS — Z794 Long term (current) use of insulin: Secondary | ICD-10-CM

## 2023-05-17 ENCOUNTER — Telehealth: Payer: Self-pay

## 2023-05-17 DIAGNOSIS — E11621 Type 2 diabetes mellitus with foot ulcer: Secondary | ICD-10-CM | POA: Diagnosis not present

## 2023-05-17 NOTE — Telephone Encounter (Signed)
 Order form, office note and demographics faxed to [facility] [phone]

## 2023-05-17 NOTE — Telephone Encounter (Signed)
-----   Message from Reina Cara sent at 05/15/2023 10:06 AM EDT ----- Need to order wound care supplies for patient, left TMA site ulcer lateral aspect measuring 1.5 x 0.5 cm.  Debrided today.  Exposed fat layer.  Dressings to be changed every 2 to 3 days.  Aquacel Ag and bordered foam dressing.  Wound cleanser as well.  Thank you!

## 2023-05-18 ENCOUNTER — Other Ambulatory Visit: Payer: Self-pay

## 2023-05-18 NOTE — Patient Instructions (Signed)
 Visit Information  Thank you for taking time to visit with me today. Please don't hesitate to contact me if I can be of assistance to you before our next scheduled appointment.  Our next appointment is by telephone on 06/01/23 at 11:30 AM with Clarnce Crow Please call the care guide team at [phone] if you need to cancel or reschedule your appointment.   Following is a copy of your care plan:   Goals Addressed             This Visit's Progress    [facility]BC[facility] RN Care Plan   On track    Problems:  Chronic Disease Management support and education needs related to DMII and recent amputation (L third toe)  Goal: Over the next 30 days the Patient will attend all scheduled medical appointments: podiatry as evidenced by completed visit notes uploaded to EMR        demonstrate Ongoing adherence to prescribed treatment plan for DMII as evidenced by compliance with continuous glucose monitor, diet as advised by provider, and exercise take all medications exactly as prescribed and will call provider for medication related questions as evidenced by medication adherence    Continue to work with home health RN towards wound care/healing  Interventions:   Diabetes Interventions: Assessed patient's understanding of A1c goal: <7% Reviewed medications with patient and discussed importance of medication adherence Discussed plans with patient for ongoing care management follow up and provided patient with direct contact information for care management team Provided patient with written educational materials related to hypo and hyperglycemia and importance of correct treatment Advised patient, providing education and rationale, to check cbg   and record, calling primary provider for findings outside established parameters Discussed eating small, frequent meals to help with blood sugar management Lab Results  Component Value Date   HGBA1C 8.0 (H) 05/03/2023    Patient Self-Care Activities:  Attend all  scheduled provider appointments Call provider office for new concerns or questions  Perform all self care activities independently  Take medications as prescribed   keep appointment with eye doctor check blood sugar at prescribed times: using continuous glucose monitor check feet daily for cuts, sores or redness  Plan:  Telephone follow up appointment with care management team member scheduled for:  06/01/23 at 11:30 AM             Please call the Suicide and Crisis Lifeline: 988 call 1-800-273-TALK (toll free, 24 hour hotline) if you are experiencing a Mental Health or Behavioral Health Crisis or need someone to talk to.  Patient verbalizes understanding of instructions and care plan provided today and agrees to view in MyChart. Active MyChart status and patient understanding of how to access instructions and care plan via MyChart confirmed with patient.     Theodora Fish, RN MSN [place]  [facility] Population Health RN Care Manager Direct Dial: [phone]  Fax: [phone]

## 2023-05-18 NOTE — Patient Outreach (Signed)
 Complex Care Management   Visit Note  05/18/2023  Name:  Linda [name] MRN: [id number] DOB: [birth year]  Situation: Referral received for Complex Care Management related to Diabetes with Complications I obtained verbal consent from Patient.  Visit completed with Leonor Ramsay  on the phone  Background:   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 2 diabetes mellitus, with fat layer exposed (HCC) 03/27/2020   Gastritis    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 No longer has    Assessment: Patient Reported Symptoms:  Cognitive Cognitive Status: Able to follow simple commands, Alert and oriented to person, place, and time Cognitive/Intellectual Conditions Management [RPT]: None reported or documented in medical history or problem list   Health Maintenance Behaviors: Annual physical exam, Exercise, Healthy diet  Neurological Neurological  Review of Symptoms: Numbness (neuropathy fingers and feet) Neurological Management Strategies: Medication therapy  HEENT HEENT Symptoms Reported: Change or loss of hearing (Occasionally has to ask people to repeat themselves. "Scratchy" voice/throat chronic) HEENT Conditions: Vision problem(s) Vision Problems: blindness/vision loss HEENT Management Strategies: Medical device Vision problem(s)  Cardiovascular Cardiovascular Symptoms Reported: Swelling in legs or feet (Bilateral swelling "about the same" one each side, at baseline) Does patient have uncontrolled Hypertension?: No Cardiovascular Conditions: High blood cholesterol, Hypertension Cardiovascular Management Strategies: Diet modification, Exercise, Medication therapy  Respiratory Respiratory Symptoms Reported: No symptoms reported    Endocrine Patient reports the following symptoms related to hypoglycemia or hyperglycemia : Other (Reports blurry vision when her sugars are elevated, most recent last week) Is patient diabetic?: Yes Is patient checking blood sugars at home?: Yes Endocrine Conditions: Diabetes Endocrine Management Strategies: Medication therapy, Diet modification, Exercise, Weight management  Gastrointestinal Gastrointestinal Symptoms Reported: Constipation, Cramping (Pt reports she is out of Miralax  for about a week. Last BM a couple of days ago, but was small and required her to strain.) Gastrointestinal Conditions: Constipation Gastrointestinal Management Strategies: Diet modification, Fluid modification, Medication therapy (Milk of magnesia last night) Gastrointestinal Comment: Advised patient to order Miralax  through grocery delivery and begin to take again. Reviewed constipation prevention techniques. Nutrition Risk Screen (CP): Difficulty chewing/swallowing (Pt reports top teeth pulled, her dentures do not fit and she has to be refitted. She does have some bottom teeth.)  Genitourinary  Genitourinary Symptoms  Reported: No symptoms reported Genitourinary Conditions: Chronic kidney disease  Integumentary Integumentary Symptoms Reported: Incision, Wound (Incision appears well healed per podiatry note 05/15/23. Wound on buttocks being managed by home health RN, performing dressing changes.) Skin Conditions: Wound, Pressure injury Skin Management Strategies: Dressing changes  Musculoskeletal Musculoskelatal Symptoms Reviewed: Unsteady gait, Weakness (Weakness in the front of her legs when she bends over) Musculoskeletal Conditions: Amputation, Back pain (Toe amputations, followed by podiatry; neuropathy) Musculoskeletal Management Strategies: Exercise, Activity, Medical device (Rollator) Musculoskeletal Comment: Next appointment with podiatry 06/05/23. RN through [place] 3x/week. PT may be coming to an end per pt. Falls in the past year?: Yes Number of falls in past year: 1 or less (Fall almost 2 weeks ago. She was in her kitchen and bent over to pick something up, losing her balance.) Was there an injury with Fall?: Yes (Pt reports skinning her L arm and bruising her elbow, healing well and HH RN keeping an eye on it) Fall Risk Category Calculator: 2 Patient Fall Risk Level: Moderate Fall Risk Patient at Risk for Falls Due to: Impaired balance/gait, Medication side effect Fall risk Follow up: Falls evaluation completed, Education provided  Psychosocial Psychosocial Symptoms Reported: Depression - if selected complete PHQ 2-9, Anxiety - if selected complete GAD Behavioral Health Conditions: Depression Behavioral Management Strategies: Medication therapy, Support system Major Change/Loss/Stressor/Fears (CP): Death of a loved one, Medical condition, self Techniques to Cope with Loss/Stress/Change: Medication Quality of Family Relationships: involved, helpful, supportive Do you feel physically threatened by others?: No      05/18/2023    1:32 PM  Depression screen PHQ 2/9  Decreased Interest 1  Down,  Depressed, Hopeless 2  PHQ - 2 Score 3  Altered sleeping 2  Tired, decreased energy 2  Change in appetite 1  Feeling bad or failure about yourself  1  Trouble concentrating 1  Moving slowly or fidgety/restless 0  Suicidal thoughts 0  PHQ-9 Score 10  Difficult doing work/chores Somewhat difficult    Vitals:   05/18/23 1355  BP: 121/70  Pulse: (!) 109    Medications Reviewed Today     Reviewed by Valaria Garland, RN (Registered Nurse) on 05/18/23 at 1402  Med List Status: <None>   Medication Order Taking? Sig Documenting Provider Last Dose Status Informant  acetaminophen  (TYLENOL ) 500 MG tablet [id number] Yes Take 500 mg by mouth every 6 (six) hours as needed for moderate pain or mild pain. [provider] Taking Active Self, Pharmacy Records  albuterol  (VENTOLIN  HFA) 108 (90 Base) MCG/ACT inhaler [id number] Yes INHALE TWO PUFFS BY MOUTH INTO LUNGS every SIX hours orn FOR WHEEZING AND/OR SHORTNESS OF Neena Baller, MD Taking Active Self, Pharmacy Records  amitriptyline  (ELAVIL ) 25 MG tablet [id number] Yes TAKE ONE TABLET BY MOUTH AT BEDTIME [name], Kirsten, MD Taking Active   atorvastatin  (LIPITOR) 10 MG tablet [id number] Yes TAKE ONE TABLET BY MOUTH EVERY Deanna Expose, Kirsten, MD Taking Active Self, Pharmacy Records  Blood Glucose Monitoring Suppl [facility] VERIO REFLECT) w/Device Suzanne Erps [id number] Yes AS DIRECTED [provider] Taking Active Self, Pharmacy Records  Blood Pressure Monitoring [facility]) MISC [id number] Yes 1 each by Does not apply route daily in the afternoon. [name], Kirsten, MD Taking Active Self, Pharmacy Records  buPROPion  (WELLBUTRIN  XL) 300 MG 24 hr tablet [id number] Yes Take 1 tablet (300 mg total) by mouth every morning. Mercy Stall, MD Taking Active Self, Pharmacy Records  busPIRone  (BUSPAR ) 5 MG tablet [id number] Yes TAKE ONE TABLET BY MOUTH  3 TIMES DAILY [name], Kirsten, MD Taking Active Self, Pharmacy Records  Continuous Blood Gluc  Receiver (FREESTYLE LIBRE 2 READER) [place] [id number] Yes E11.69 Check blood sugar 4 times daily as directed [name], Burleigh Carp, MD Taking Active Self, Pharmacy Records  Continuous Glucose Sensor (FREESTYLE LIBRE 3 PLUS SENSOR) [place] [id number] Yes Change sensor every 15 days. CoxBurleigh Carp, MD Taking Active Self, Pharmacy Records  cyclobenzaprine  (FLEXERIL ) 10 MG tablet [id number] Yes TAKE ONE TABLET BY MOUTH EVERY 8 HOURS AS NEEDED FOR MUSCLE SPASMS [name], Kirsten, MD Taking Active Self, Pharmacy Records  dicyclomine  (BENTYL ) 20 MG tablet [id number] Yes TAKE ONE TABLET BY MOUTH BEFORE MEALS AND AT BEDTIME AS NEEDED FOR STOMACH CRAMPING [name], Burleigh Carp, MD Taking Active Self, Pharmacy Records           Med Note [facility], [place] A   Wed Apr 19, 2023  9:44 AM) Patient reports she has not been needed this in the last week  FETZIMA  80 MG CP24 [id number] Yes TAKE ONE CAPSULE BY MOUTH EVERY Fleeta Hull, MD Taking Active Self, Pharmacy Records  gabapentin  (NEURONTIN ) 400 MG capsule [id number] Yes TAKE 2 CAPSULES BY MOUTH 3 TIMES DAILY Sirivol, Mamatha, MD Taking Active   gabapentin  (NEURONTIN ) 600 MG tablet [id number]  Take 1 tablet (600 mg total) by mouth 3 (three) times daily for 14 days.  Patient not taking: Reported on 05/03/2023   Janece Means, FNP  Expired 05/05/23 2359 Self, Pharmacy Records           Med Note [facility], [place]   Wed Apr 19, 2023  9:52 AM) Patient states she was taking this prior to hospitalization and has in her pill packs  glucose blood test strip [id number] Yes Use as instructed to check FBS daily. E11.40 CoxBurleigh Carp, MD Taking Active Self, Pharmacy Records  Lancets [facility] Jewelene Morton PLUS [place]) [place] [id number] Yes USE TO check blood glucose 2-3 times daily AS Henderson Lock, MD Taking Active Self, Pharmacy Records  levothyroxine  (SYNTHROID ) 75 MCG tablet [id number] Yes Take 1 tablet (75 mcg total) by mouth daily before breakfast. Mercy Stall, MD Taking Active Self, Pharmacy Records   lisdexamfetamine (VYVANSE ) 70 MG capsule [id number] Yes TAKE ONE CAPSULE BY MOUTH EVERY DAY [name], Kirsten, MD Taking Active   losartan  (COZAAR ) 50 MG tablet [id number] Yes TAKE ONE TABLET BY MOUTH EVERY Fleeta Hull, MD Taking Active   Magnesium  500 MG CAPS [id number] Yes Take 1 capsule (500 mg total) by mouth daily. [name], Kirsten, MD Taking Active Self, Pharmacy Records  metoprolol  succinate (TOPROL -XL) 25 MG 24 hr tablet [id number] Yes TAKE 1/2 TABLET BY MOUTH EVERY DAY [name], Kirsten, MD Taking Active   morphine  (MS CONTIN ) 30 MG 12 hr tablet [id number] Yes Take 1 tablet (30 mg total) by mouth every 12 (twelve) hours. [name], Kirsten, MD Taking Active   Multiple Vitamin (MULTIVITAMIN WITH MINERALS) TABS tablet [id number] Yes Take 1 tablet by mouth daily. Solar ray [provider] Taking Active Self, Pharmacy Records           Med Note [facility]   Tue Mar 01, 2022  4:02 PM)    nystatin  (MYCOSTATIN /NYSTOP ) powder [id number] Yes Apply 1 Application topically 3 (three) times daily. Janece Means, FNP Taking Active Self, Pharmacy Records           Med Note ([facility] P   Thu May 18, 2023  2:01 PM) Patient is taking as needed  ondansetron  (ZOFRAN ) 4 MG tablet [id number] Yes Take 1 tablet (4  mg total) by mouth every 4 (four) hours as needed for nausea. [name], Kirsten, MD Taking Active Self, Pharmacy Records           Med Note [facility], [place] A   Wed Apr 19, 2023 10:01 AM) Patient states she has only used once in the past week - takes as needed  OVER THE COUNTER MEDICATION [id number] No Take 1 tablet by mouth in the morning and at bedtime. Lutein for eyes  Patient not taking: Reported on 05/18/2023   [provider] Not Taking Active Self, Pharmacy Records  oxyCODONE  (OXY IR/ROXICODONE ) 5 MG immediate release tablet [id number] No Take 1 tablet (5 mg total) by mouth every 4 (four) hours as needed for moderate pain (pain score 4-6).  Patient not taking: Reported on 05/18/2023    Oral Billings, MD Not Taking Active   pantoprazole  (PROTONIX ) 40 MG tablet [id number] Yes TAKE ONE TABLET BY MOUTH TWICE DAILY [name], Kirsten, MD Taking Active   polyethylene glycol (MIRALAX  / GLYCOLAX ) 17 g packet [id number] Yes Take 17 g by mouth daily as needed for moderate constipation. [provider] Taking Active Self, Pharmacy Records           Med Note [facility] [place]   Wed Apr 19, 2023 10:02 AM) Patient reports moving bowels - takes as needed  potassium chloride  (MICRO-K ) 10 MEQ CR capsule [id number] Yes Take 2 capsules (20 mEq total) by mouth 2 (two) times daily. Mercy Stall, MD Taking Active Self, Pharmacy Records  Probiotic Product (PROBIOTIC PO) [id number] Yes Take 1 capsule by mouth in the morning. 3.2 billion cfu [provider] Taking Active Self, Pharmacy Records  prochlorperazine  (COMPAZINE ) 5 MG tablet [id number] Yes TAKE ONE TABLET BY MOUTH every SIX hours AS NEEDED FOR NAUSEA AND VOMITING [name], Kirsten, MD Taking Active Self, Pharmacy Records           Med Note [facility], [place] A   Wed Apr 19, 2023 10:03 AM) Patient states she has not needed "in a while"  ramelteon  (ROZEREM ) 8 MG tablet [id number] Yes TAKE ONE TABLET BY MOUTH AT BEDTIME [name], Burleigh Carp, MD Taking Active Self, Pharmacy Records  rOPINIRole  (REQUIP ) 0.25 MG tablet [id number] Yes TAKE ONE TABLET BY MOUTH AT BEDTIME [name], Kirsten, MD Taking Active Self, Pharmacy Records  SURE COMFORT PEN NEEDLES 32G X 4 MM MISC [id number] Yes Use new needle with each injection. Mercy Stall, MD Taking Active   SYNJARDY  12.05-998 MG TABS [id number] Yes TAKE ONE TABLET BY MOUTH TWICE DAILY Cyndi Drain, PA-C Taking Active   tirzepatide  (MOUNJARO ) 10 MG/0.5ML Pen [id number] Yes Inject 10 mg into the skin once a week. [name], Kirsten, MD Taking Active            Med Note [facility], [place] A   Thu May 11, 2023  1:10 PM) Patient states she is completing prior dose 7.5mg  and will start 10mg   TRESIBA  FLEXTOUCH 200 UNIT/ML FlexTouch Pen  [id number] Yes INJECT 60 UNITS SUBCUTANEOUSLY DAILY [name], Kirsten, MD Taking Active   Vitamin D , Ergocalciferol , (DRISDOL ) 1.25 MG (50000 UNIT) CAPS capsule [id number]  TAKE ONE CAPSULE BY MOUTH EVERY FRIDAY AT Isadora Mar, MD  Active Self, Pharmacy Records  VRAYLAR  3 MG capsule [id number]  TAKE ONE CAPSULE BY MOUTH EVERY DAY [name], Kirsten, MD  Active Self, Pharmacy Records            Recommendation:   Specialty provider follow-up podiatry 06/05/23 Log into your insurance account or call the number on the back of  your card to locate a dentist that accepts your insurance  Follow Up Plan:   Telephone follow up appointment date/time:  06/01/23 at 11:30 AM with Feliz Hosteller, RN MSN [place]  [facility] RN Care Manager Direct Dial: [phone]  Fax: [phone]

## 2023-05-28 ENCOUNTER — Other Ambulatory Visit: Payer: Self-pay | Admitting: Family Medicine

## 2023-05-28 DIAGNOSIS — E1140 Type 2 diabetes mellitus with diabetic neuropathy, unspecified: Secondary | ICD-10-CM

## 2023-05-28 NOTE — Telephone Encounter (Signed)
Error. [name]

## 2023-05-29 ENCOUNTER — Ambulatory Visit: Admitting: Podiatry

## 2023-05-29 ENCOUNTER — Encounter: Payer: Self-pay | Admitting: Podiatry

## 2023-05-29 ENCOUNTER — Ambulatory Visit (INDEPENDENT_AMBULATORY_CARE_PROVIDER_SITE_OTHER)

## 2023-05-29 DIAGNOSIS — M778 Other enthesopathies, not elsewhere classified: Secondary | ICD-10-CM

## 2023-05-29 DIAGNOSIS — L03116 Cellulitis of left lower limb: Secondary | ICD-10-CM

## 2023-05-29 DIAGNOSIS — Z89421 Acquired absence of other right toe(s): Secondary | ICD-10-CM

## 2023-05-29 DIAGNOSIS — Z89432 Acquired absence of left foot: Secondary | ICD-10-CM

## 2023-05-29 DIAGNOSIS — L97422 Non-pressure chronic ulcer of left heel and midfoot with fat layer exposed: Secondary | ICD-10-CM

## 2023-05-29 DIAGNOSIS — E11621 Type 2 diabetes mellitus with foot ulcer: Secondary | ICD-10-CM | POA: Diagnosis not present

## 2023-05-29 MED ORDER — CIPROFLOXACIN HCL 500 MG PO TABS: 500.0000 mg | ORAL_TABLET | Freq: Two times a day (BID) | ORAL | 0 refills | Status: AC

## 2023-05-29 NOTE — Progress Notes (Addendum)
 Subjective:  Patient ID: Linda [name], female    DOB: [birth year],  MRN: [id number]  Chief Complaint  Patient presents with   Wound Check    Left heel is calloused thickly and caousing pain. The wound to the lateral side of the foot is closing a little and the nurse states there is new growth on top and bottom.     DOS: 04/09/2023 Procedure: Left foot TMA and TAL  53 y.o. female returns for post-op check for above procedures.  She presents ambulating in surgical shoe with walker assistance.  Her and her home health aide have noticed some redness and swelling to the foot.  She has been keeping up with dressing changes to left lateral ulceration to incision site.  She is concerned that she may have noticed some green drainage on this area.  Review of Systems: Negative except as noted in the HPI. Denies N/V/F/Ch.  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 No longer has    Current Outpatient Medications:    acetaminophen  (TYLENOL ) 500 MG tablet, Take 500 mg by mouth every 6 (six) hours as needed for moderate pain or mild pain., Disp: , Rfl:    albuterol  (VENTOLIN  HFA) 108 (90 Base) MCG/ACT inhaler, INHALE TWO PUFFS BY MOUTH INTO LUNGS every SIX hours orn FOR WHEEZING AND/OR SHORTNESS OF BREATH, Disp: 8.5 g, Rfl: 1   amitriptyline  (ELAVIL ) 25 MG tablet, TAKE ONE TABLET BY MOUTH AT BEDTIME, Disp: 30 tablet, Rfl: 1   atorvastatin  (LIPITOR) 10 MG tablet, TAKE ONE TABLET BY MOUTH EVERY EVENING, Disp: 90 tablet, Rfl: 1   Blood Glucose Monitoring Suppl (ONETOUCH VERIO REFLECT) w/Device KIT, AS DIRECTED, Disp: , Rfl:    Blood Pressure Monitoring (SPHYGMOMANOMETER) MISC, 1 each by Does not apply route daily in the afternoon., Disp: 1 each, Rfl: 0   buPROPion  (WELLBUTRIN  XL) 300 MG 24 hr tablet, Take 1 tablet (300 mg total) by mouth every morning., Disp: 90 tablet, Rfl: 3   busPIRone  (BUSPAR ) 5 MG tablet, TAKE ONE TABLET BY MOUTH 3 TIMES DAILY, Disp: 90 tablet, Rfl: 3   ciprofloxacin (CIPRO) 500 MG  tablet, Take 1 tablet (500 mg total) by mouth 2 (two) times daily for 7 days., Disp: 14 tablet, Rfl: 0   Continuous Blood Gluc Receiver (FREESTYLE LIBRE 2 READER) DEVI, E11.69 Check blood sugar 4 times daily as directed, Disp: 1 each, Rfl: 0   Continuous Glucose Sensor (FREESTYLE LIBRE 3 PLUS SENSOR) MISC, Change sensor every 15 days., Disp: 6 each, Rfl: 1   cyclobenzaprine  (FLEXERIL ) 10 MG tablet, TAKE ONE TABLET BY MOUTH EVERY 8 HOURS AS NEEDED FOR MUSCLE SPASMS, Disp: 270 tablet, Rfl: 1   dicyclomine  (BENTYL ) 20 MG tablet, TAKE ONE TABLET BY MOUTH BEFORE MEALS AND AT BEDTIME AS NEEDED FOR STOMACH CRAMPING, Disp: 180 tablet, Rfl: 1   FETZIMA  80 MG CP24, TAKE ONE CAPSULE BY MOUTH EVERY EVENING, Disp: 90 capsule, Rfl: 1   gabapentin   (NEURONTIN ) 400 MG capsule, TAKE 2 CAPSULES BY MOUTH 3 TIMES DAILY, Disp: 180 capsule, Rfl: 1   glucose blood test strip, Use as instructed to check FBS daily. E11.40, Disp: 100 each, Rfl: 12   Lancets (ONETOUCH DELICA PLUS LANCET30G) MISC, USE TO check blood glucose 2-3 times daily AS DIRECTED, Disp: 100 each, Rfl: 3   levothyroxine  (SYNTHROID ) 75 MCG tablet, Take 1 tablet (75 mcg total) by mouth daily before breakfast., Disp: 90 tablet, Rfl: 3   lisdexamfetamine (VYVANSE ) 70 MG capsule, TAKE ONE CAPSULE BY MOUTH EVERY DAY, Disp: 30 capsule, Rfl: 0   losartan  (COZAAR ) 50 MG tablet, TAKE ONE TABLET BY MOUTH EVERY EVENING, Disp: 90 tablet, Rfl: 1   Magnesium  500 MG CAPS, Take 1 capsule (500 mg total) by mouth daily., Disp: 90 capsule, Rfl: 1   metoprolol  succinate (TOPROL -XL) 25 MG 24 hr tablet, TAKE 1/2 TABLET BY MOUTH EVERY DAY, Disp: 45 tablet, Rfl: 1   morphine  (MS CONTIN ) 30 MG 12 hr tablet, Take 1 tablet (30 mg total) by mouth every 12 (twelve) hours., Disp: 60 tablet, Rfl: 0   Multiple Vitamin (MULTIVITAMIN WITH MINERALS) TABS tablet, Take 1 tablet by mouth daily. Solar ray, Disp: , Rfl:    nystatin  (MYCOSTATIN /NYSTOP ) powder, Apply 1 Application topically 3 (three) times daily., Disp: 15 g, Rfl: 0   ondansetron  (ZOFRAN ) 4 MG tablet, Take 1 tablet (4 mg total) by mouth every 4 (four) hours as needed for nausea., Disp: 90 tablet, Rfl: 3   OVER THE COUNTER MEDICATION, Take 1 tablet by mouth in the morning and at bedtime. Lutein for eyes, Disp: , Rfl:    oxyCODONE  (OXY IR/ROXICODONE ) 5 MG immediate release tablet, Take 1 tablet (5 mg total) by mouth every 4 (four) hours as needed for moderate pain (pain score 4-6)., Disp: 10 tablet, Rfl: 0   pantoprazole  (PROTONIX ) 40 MG tablet, TAKE ONE TABLET BY MOUTH TWICE DAILY, Disp: 180 tablet, Rfl: 1   polyethylene glycol (MIRALAX  / GLYCOLAX ) 17 g packet, Take 17 g by mouth daily as needed for moderate constipation., Disp: , Rfl:    potassium chloride   (MICRO-K ) 10 MEQ CR capsule, Take 2 capsules (20 mEq total) by mouth 2 (two) times daily., Disp: 360 capsule, Rfl: 3   Probiotic Product (PROBIOTIC PO), Take 1 capsule by mouth in the morning. 3.2 billion cfu, Disp: , Rfl:    prochlorperazine  (COMPAZINE ) 5 MG tablet, TAKE ONE TABLET BY MOUTH every SIX hours AS NEEDED FOR NAUSEA AND VOMITING, Disp: 30 tablet, Rfl: 1   ramelteon  (ROZEREM ) 8 MG tablet, TAKE ONE TABLET BY MOUTH AT BEDTIME, Disp: 90 tablet, Rfl: 1   rOPINIRole  (REQUIP ) 0.25 MG tablet,  TAKE ONE TABLET BY MOUTH AT BEDTIME, Disp: 90 tablet, Rfl: 1   SURE COMFORT PEN NEEDLES 32G X 4 MM MISC, Use new needle with each injection., Disp: 100 each, Rfl: 1   SYNJARDY  12.05-998 MG TABS, TAKE ONE TABLET BY MOUTH TWICE DAILY, Disp: 60 tablet, Rfl: 1   tirzepatide  (MOUNJARO ) 10 MG/0.5ML Pen, Inject 10 mg into the skin once a week., Disp: 6 mL, Rfl: 0   TRESIBA  FLEXTOUCH 200 UNIT/ML FlexTouch Pen, INJECT 60 UNITS SUBCUTANEOUSLY DAILY, Disp: 9 mL, Rfl: 3   Vitamin D , Ergocalciferol , (DRISDOL ) 1.25 MG (50000 UNIT) CAPS capsule, TAKE ONE CAPSULE BY MOUTH EVERY FRIDAY AT 8AM, Disp: 12 capsule, Rfl: 1   VRAYLAR  3 MG capsule, TAKE ONE CAPSULE BY MOUTH EVERY DAY, Disp: 90 capsule, Rfl: 0   gabapentin  (NEURONTIN ) 600 MG tablet, Take 1 tablet (600 mg total) by mouth 3 (three) times daily for 14 days. (Patient not taking: Reported on 05/03/2023), Disp: 42 tablet, Rfl: 0 No current facility-administered medications for this visit.  Facility-Administered Medications Ordered in Other Visits:    sodium chloride  flush (NS) 0.9 % injection 10 mL, 10 mL, Intracatheter, PRN, Mosher, Kelli A, PA-C, 10 mL at 02/19/21 1004  Social History   Tobacco Use  Smoking Status Never  Smokeless Tobacco Never    Allergies  Allergen Reactions   Codeine Shortness Of Breath and Other (See Comments)    Other reaction(s): SHOB    Amoxicillin -Pot Clavulanate Diarrhea   Celebrex [Celecoxib] Other (See Comments)    Unknown  reaction   Propranolol Other (See Comments)    Other reaction(s): Unknown   Propranolol Hcl Other (See Comments)    Unknown reaction  ask   Simvastatin Other (See Comments)    Other reaction(s): Unknown   Vytorin [Ezetimibe-Simvastatin] Other (See Comments)    Unknown reaction  Patient is not aware of an allergy to this medication, ask   Zetia [Ezetimibe] Other (See Comments)    Other reaction(s): Unknown   Objective:  There were no vitals filed for this visit. There is no height or weight on file to calculate BMI. Constitutional Well developed. Well nourished.  Vascular Foot warm and well perfused.  Palpable pedal pulses Capillary refill intact to TMA flap.  There is some erythema present of the forefoot into the posterior heel at TAL sites.  Left foot edematous versus right foot.  Mildly increased warmth to left foot TMA versus right side.  Neurologic Normal speech. Oriented to person, place, and time. Protective sensation intact  Dermatologic Lateral aspect of TMA incision there is area of superficial ulceration limited to exposed fat layer 1.5 x 1 x 0.3 cm in surface area with fat layer exposed. No significant drainage here, does appear stable appearing today.  Overlying eschar noted with granulation tissue to wound bed following debridement.  Incision site otherwise appears well-healed.  Hyperkeratotic callus present left plantar medial heel.  Orthopedic: Minimal tenderness to palpation noted about the surgical site.  Hammertoe contractures of right 4th and 5th toe, no ulceration appears stable.     Radiographs: Left foot 3 views 05/29/2023 Surgical changes status post left foot transmetatarsal amputation noted. Stable appearing in AP and Lateral view.  Distal metatarsals are well-visualized without obvious signs of lucency, cortical erosion or periosteal reaction.  Amorphous cloudy appearance in the soft tissues resembling early heterotopic ossification noted.  Assessment:    1. History of transmetatarsal amputation of left foot (HCC)   2. Ulcer of left midfoot with fat layer exposed (  HCC)   3. Cellulitis of left foot   4. S/P amputation of lesser toe, right (HCC)    Plan:  Patient was evaluated and treated and all questions answered.  S/p foot surgery left transmetatarsal amputation and tendo Achilles lengthening -Surgical site appears stable -No obvious signs of osteomyelitis on radiographs, findings more likely support heterotopic ossification -Area of lateral dehiscence is superficial at this point.  Overlying eschar was sharply debrided using 15 blade, underlying of dehiscence with fat layer exposed superficially measuring 1.5 x 1 x 0.3 cm.  Aquacel Ag and bordered foam dressing applied. - Dressing supplies will be ordered to patient to be applied every day to every other day with wound cleanser, Aquacel Ag, bordered foam. -Does have some erythema and edema today, concerning for possible cellulitis.  Starting antibiotics as a precaution.  Ordering CBC, sed rate and CRP.  Will order advanced imaging as indicated.  Discussed this with patient. -Ciprofloxacin ordered to patient's pharmacy based on reported greenish drainage on dressing and original surgical cultures positive for Klebsiella susceptible to Cipro. - Hyperkeratotic callus left heel was sharply debrided as a courtesy using 312 scalpel blade without incident. - Order placed for medically necessary diabetic shoes with 3 sets of extra-depth inserts.  Patient will need forefoot fill for left foot TMA site, and would benefit from toe filler for the right foot due to prior amputation of first, second, third toes. - She can weight-bear as tolerated at this point in surgical shoe, we will progress to weightbearing as tolerated diabetic shoes once they are in.  Return in about 1 week (around 06/05/2023) for Post op XR.

## 2023-05-30 ENCOUNTER — Encounter: Payer: Self-pay | Admitting: Podiatry

## 2023-05-30 ENCOUNTER — Other Ambulatory Visit: Payer: Self-pay | Admitting: Family Medicine

## 2023-05-30 ENCOUNTER — Other Ambulatory Visit: Payer: Self-pay | Admitting: Podiatry

## 2023-05-30 DIAGNOSIS — M869 Osteomyelitis, unspecified: Secondary | ICD-10-CM

## 2023-05-30 DIAGNOSIS — Z89432 Acquired absence of left foot: Secondary | ICD-10-CM

## 2023-05-30 LAB — CBC WITH DIFFERENTIAL/PLATELET
Basophils Absolute: 0 10*3/uL (ref 0.0–0.2)
Basos: 0 %
EOS (ABSOLUTE): 0.1 10*3/uL (ref 0.0–0.4)
Eos: 2 %
Hematocrit: 36.2 % (ref 34.0–46.6)
Hemoglobin: 10.9 g/dL — ABNORMAL LOW (ref 11.1–15.9)
Immature Grans (Abs): 0 10*3/uL (ref 0.0–0.1)
Immature Granulocytes: 0 %
Lymphocytes Absolute: 1 10*3/uL (ref 0.7–3.1)
Lymphs: 19 %
MCH: 25.5 pg — ABNORMAL LOW (ref 26.6–33.0)
MCHC: 30.1 g/dL — ABNORMAL LOW (ref 31.5–35.7)
MCV: 85 fL (ref 79–97)
Monocytes Absolute: 0.3 10*3/uL (ref 0.1–0.9)
Monocytes: 6 %
Neutrophils Absolute: 3.7 10*3/uL (ref 1.4–7.0)
Neutrophils: 73 %
Platelets: 76 10*3/uL — CL (ref 150–450)
RBC: 4.28 x10E6/uL (ref 3.77–5.28)
RDW: 15.5 % — ABNORMAL HIGH (ref 11.7–15.4)
WBC: 5 10*3/uL (ref 3.4–10.8)

## 2023-05-30 LAB — C-REACTIVE PROTEIN: CRP: 52 mg/L — ABNORMAL HIGH (ref 0–10)

## 2023-05-30 LAB — SEDIMENTATION RATE: Sed Rate: 50 mm/h — ABNORMAL HIGH (ref 0–40)

## 2023-05-30 NOTE — Progress Notes (Signed)
 Labs reviewed showing no elevation of WBC, however there are significant elevations in sed rate-50, CRP-52. Will alert patient of her low hemoglobin and platelets as well.  Ordering 3 phase bone scan nuclear medicine to evaluate for possible left foot osteomyelitis. Prefer this modality over MRI at this point due to possibility of marrow edema or increased signal intensity from recent surgery and weightbearing.

## 2023-06-01 ENCOUNTER — Telehealth: Payer: Self-pay

## 2023-06-05 ENCOUNTER — Ambulatory Visit (INDEPENDENT_AMBULATORY_CARE_PROVIDER_SITE_OTHER): Admitting: Podiatry

## 2023-06-05 ENCOUNTER — Encounter: Payer: Self-pay | Admitting: Podiatry

## 2023-06-05 DIAGNOSIS — Z89432 Acquired absence of left foot: Secondary | ICD-10-CM

## 2023-06-05 DIAGNOSIS — L97422 Non-pressure chronic ulcer of left heel and midfoot with fat layer exposed: Secondary | ICD-10-CM

## 2023-06-05 DIAGNOSIS — M869 Osteomyelitis, unspecified: Secondary | ICD-10-CM

## 2023-06-05 NOTE — Progress Notes (Signed)
 Subjective:  Patient ID: Linda [name], female    DOB: 01-May-1970,  MRN: [id number]  Chief Complaint  Patient presents with   Wound Check    Left lateral foot wound, looks like it is closing well, there is a scab currently. They have cut her home health visits back to 1 time a weeks and she needs you to put in that it is needed for her to have 3 visits a week because onece they cut her down, it gets bad again. Last A1c was 8, in March. No anti coag.     DOS: 04/09/2023 Procedure: Left foot TMA and TAL  53 y.o. female returns for post-op check for above procedures.  She presents ambulating in surgical shoe with walker assistance.  Redness and swelling appear improved from previous.  She states that the wound does appear improved from previous as well.  Labs did show elevation of inflammatory markers.  Three-phase bone scan has not been scheduled.  Review of Systems: Negative except as noted in the HPI. Denies N/V/F/Ch.  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 No longer has    Current Outpatient Medications:    acetaminophen  (TYLENOL ) 500 MG tablet, Take 500 mg by mouth every 6 (six) hours as needed for moderate pain or mild pain., Disp: , Rfl:    albuterol  (VENTOLIN  HFA) 108 (90 Base) MCG/ACT inhaler, INHALE TWO PUFFS BY MOUTH INTO LUNGS every SIX hours orn FOR WHEEZING AND/OR SHORTNESS OF BREATH, Disp: 8.5 g, Rfl: 1   amitriptyline  (ELAVIL ) 25 MG tablet, TAKE ONE TABLET BY MOUTH AT BEDTIME, Disp: 30 tablet, Rfl: 1   atorvastatin  (LIPITOR) 10 MG tablet, TAKE ONE TABLET BY MOUTH EVERY EVENING, Disp: 90 tablet, Rfl: 1   Blood Glucose Monitoring Suppl (ONETOUCH VERIO REFLECT) w/Device KIT, AS DIRECTED, Disp: , Rfl:    Blood Pressure Monitoring (SPHYGMOMANOMETER) MISC, 1 each by Does not apply route daily in the afternoon., Disp: 1 each, Rfl: 0   buPROPion  (WELLBUTRIN  XL) 300 MG 24 hr tablet, Take 1 tablet (300 mg total) by mouth every morning., Disp: 90 tablet, Rfl: 3   busPIRone  (  BUSPAR ) 5 MG tablet, TAKE ONE TABLET BY MOUTH 3 TIMES DAILY, Disp: 90 tablet, Rfl: 3   ciprofloxacin  (CIPRO ) 500 MG tablet, Take 1 tablet (500 mg total) by mouth 2 (two) times daily for 7 days., Disp: 14 tablet, Rfl: 0   Continuous Blood Gluc Receiver (FREESTYLE LIBRE 2 READER) DEVI, E11.69 Check blood sugar 4 times daily as directed, Disp: 1 each, Rfl: 0   Continuous Glucose Sensor (FREESTYLE LIBRE 3 PLUS SENSOR) MISC, Change sensor every 15 days., Disp: 6 each, Rfl: 1   cyclobenzaprine  (FLEXERIL ) 10 MG tablet, TAKE ONE TABLET BY MOUTH EVERY 8 HOURS AS NEEDED FOR MUSCLE SPASMS, Disp: 270 tablet, Rfl: 1   dicyclomine  (BENTYL ) 20 MG tablet, TAKE ONE TABLET BY MOUTH BEFORE MEALS AND AT BEDTIME AS NEEDED FOR STOMACH CRAMPING, Disp: 180  tablet, Rfl: 1   FETZIMA  80 MG CP24, TAKE ONE CAPSULE BY MOUTH EVERY EVENING, Disp: 90 capsule, Rfl: 1   gabapentin  (NEURONTIN ) 400 MG capsule, TAKE 2 CAPSULES BY MOUTH 3 TIMES DAILY, Disp: 180 capsule, Rfl: 1   glucose blood test strip, Use as instructed to check FBS daily. E11.40, Disp: 100 each, Rfl: 12   Lancets (ONETOUCH DELICA PLUS LANCET30G) MISC, USE TO check blood glucose 2-3 times daily AS DIRECTED, Disp: 100 each, Rfl: 3   levothyroxine  (SYNTHROID ) 75 MCG tablet, Take 1 tablet (75 mcg total) by mouth daily before breakfast., Disp: 90 tablet, Rfl: 3   lisdexamfetamine (VYVANSE ) 70 MG capsule, TAKE ONE CAPSULE BY MOUTH EVERY DAY, Disp: 30 capsule, Rfl: 0   losartan  (COZAAR ) 50 MG tablet, TAKE ONE TABLET BY MOUTH EVERY EVENING, Disp: 90 tablet, Rfl: 1   Magnesium  500 MG CAPS, Take 1 capsule (500 mg total) by mouth daily., Disp: 90 capsule, Rfl: 1   metoprolol  succinate (TOPROL -XL) 25 MG 24 hr tablet, TAKE 1/2 TABLET BY MOUTH EVERY DAY, Disp: 45 tablet, Rfl: 1   morphine  (MS CONTIN ) 30 MG 12 hr tablet, Take 1 tablet (30 mg total) by mouth every 12 (twelve) hours., Disp: 60 tablet, Rfl: 0   Multiple Vitamin (MULTIVITAMIN WITH MINERALS) TABS tablet, Take 1 tablet by mouth daily. Solar ray, Disp: , Rfl:    nystatin  (MYCOSTATIN /NYSTOP ) powder, Apply 1 Application topically 3 (three) times daily., Disp: 15 g, Rfl: 0   ondansetron  (ZOFRAN ) 4 MG tablet, Take 1 tablet (4 mg total) by mouth every 4 (four) hours as needed for nausea., Disp: 90 tablet, Rfl: 3   OVER THE COUNTER MEDICATION, Take 1 tablet by mouth in the morning and at bedtime. Lutein for eyes, Disp: , Rfl:    oxyCODONE  (OXY IR/ROXICODONE ) 5 MG immediate release tablet, Take 1 tablet (5 mg total) by mouth every 4 (four) hours as needed for moderate pain (pain score 4-6)., Disp: 10 tablet, Rfl: 0   pantoprazole  (PROTONIX ) 40 MG tablet, TAKE ONE TABLET BY MOUTH TWICE DAILY, Disp: 180 tablet, Rfl: 1   polyethylene glycol (MIRALAX  / GLYCOLAX )  17 g packet, Take 17 g by mouth daily as needed for moderate constipation., Disp: , Rfl:    potassium chloride  (MICRO-K ) 10 MEQ CR capsule, Take 2 capsules (20 mEq total) by mouth 2 (two) times daily., Disp: 360 capsule, Rfl: 3   Probiotic Product (PROBIOTIC PO), Take 1 capsule by mouth in the morning. 3.2 billion cfu, Disp: , Rfl:    prochlorperazine  (COMPAZINE ) 5 MG tablet, TAKE ONE TABLET BY MOUTH every SIX hours AS NEEDED FOR NAUSEA AND VOMITING, Disp: 30 tablet, Rfl: 1   ramelteon  (  ROZEREM ) 8 MG tablet, TAKE ONE TABLET BY MOUTH AT BEDTIME, Disp: 90 tablet, Rfl: 1   rOPINIRole  (REQUIP ) 0.25 MG tablet, TAKE ONE TABLET BY MOUTH AT BEDTIME, Disp: 90 tablet, Rfl: 1   SURE COMFORT PEN NEEDLES 32G X 4 MM MISC, Use new needle with each injection., Disp: 100 each, Rfl: 1   SYNJARDY  12.05-998 MG TABS, TAKE ONE TABLET BY MOUTH TWICE DAILY, Disp: 60 tablet, Rfl: 1   tirzepatide  (MOUNJARO ) 10 MG/0.5ML Pen, Inject 10 mg into the skin once a week., Disp: 6 mL, Rfl: 0   TRESIBA  FLEXTOUCH 200 UNIT/ML FlexTouch Pen, INJECT 60 UNITS SUBCUTANEOUSLY DAILY, Disp: 9 mL, Rfl: 3   Vitamin D , Ergocalciferol , (DRISDOL ) 1.25 MG (50000 UNIT) CAPS capsule, TAKE ONE CAPSULE BY MOUTH EVERY FRIDAY AT 8AM, Disp: 12 capsule, Rfl: 1   VRAYLAR  3 MG capsule, TAKE ONE CAPSULE BY MOUTH EVERY DAY, Disp: 90 capsule, Rfl: 0   gabapentin  (NEURONTIN ) 600 MG tablet, Take 1 tablet (600 mg total) by mouth 3 (three) times daily for 14 days. (Patient not taking: Reported on 05/03/2023), Disp: 42 tablet, Rfl: 0 No current facility-administered medications for this visit.  Facility-Administered Medications Ordered in Other Visits:    sodium chloride  flush (NS) 0.9 % injection 10 mL, 10 mL, Intracatheter, PRN, Mosher, Kelli A, PA-C, 10 mL at 02/19/21 1004  Social History   Tobacco Use  Smoking Status Never  Smokeless Tobacco Never    Allergies  Allergen Reactions   Codeine Shortness Of Breath and Other (See Comments)    Other  reaction(s): SHOB    Amoxicillin -Pot Clavulanate Diarrhea   Celebrex [Celecoxib] Other (See Comments)    Unknown reaction   Propranolol Other (See Comments)    Other reaction(s): Unknown   Propranolol Hcl Other (See Comments)    Unknown reaction  ask   Simvastatin Other (See Comments)    Other reaction(s): Unknown   Vytorin [Ezetimibe-Simvastatin] Other (See Comments)    Unknown reaction  Patient is not aware of an allergy to this medication, ask   Zetia [Ezetimibe] Other (See Comments)    Other reaction(s): Unknown   Objective:  There were no vitals filed for this visit. There is no height or weight on file to calculate BMI. Constitutional Well developed. Well nourished.  Vascular Foot warm and well perfused.  Palpable pedal pulses Capillary refill intact to TMA flap.  There is some erythema present of the forefoot into the posterior heel at TAL sites.  Left foot edematous versus right foot.  Mildly increased warmth to left foot TMA versus right side.  Neurologic Normal speech. Oriented to person, place, and time. Protective sensation intact  Dermatologic Lateral aspect of TMA incision there is area of superficial ulceration limited to exposed fat layer 2 x 0.8 x 0.2 cm in surface area with fat layer exposed. No significant drainage here, does appear stable appearing today.  Overlying eschar that bleeds readily well with debridement noted.  Incision site otherwise appears well-healed.    Orthopedic: Minimal tenderness to palpation noted about the surgical site.  Hammertoe contractures of right 4th and 5th toe, no ulceration appears stable.     Radiographs: Left foot 3 views 05/29/2023 Surgical changes status post left foot transmetatarsal amputation noted. Stable appearing in AP and Lateral view.  Distal metatarsals are well-visualized without obvious signs of lucency, cortical erosion or periosteal reaction.  Amorphous cloudy appearance in the soft tissues resembling early  heterotopic ossification noted.  Assessment:   No diagnosis found.  Plan:  Patient was evaluated and treated and all questions answered.  S/p foot surgery left transmetatarsal amputation and tendo Achilles lengthening -Surgical site appears stable - Wound lateral incision was excisionally debrided today using 312 scalpel blade.  Cleansed with wound cleanser and dressed with Aquacel Ag and bordered foam dressing. She needs to change this 3 times a week, states that she is having great difficulty doing so due to functional limitations.  Will order home health to come out 3 times a week to assist with dressing changes. - Cellulitis does appear improved on ciprofloxacin .  Plan to obtain follow-up radiographs in about 2 weeks at next ulcer check and may obtain new labs at that point in time as well.  Following up to see about getting three-phase bone scan scheduled to evaluate for any underlying osteomyelitis.  Monitor off antibiotics in the meantime. - Pending fitting appointment for diabetic shoes/partial forefoot fill for left foot - She can weight-bear as tolerated at this point in surgical shoe, we will progress to weightbearing as tolerated in diabetic shoes once they are in.  Return in about 2 weeks (around 06/19/2023) for Ulcer Check.

## 2023-06-07 ENCOUNTER — Telehealth: Payer: Self-pay

## 2023-06-07 NOTE — Progress Notes (Signed)
 Complex Care Management Care Guide Note  06/07/2023 Name: Linda [name] MRN: [id number] DOB: 1970/08/24  Linda [name] is a 53 y.o. year old female who is a primary care patient of [name], Kirsten, MD and is actively engaged with the care management team. I reached out to Charle Congo by phone today to assist with scheduling  with the RN Case Manager.  Follow up plan: Unsuccessful telephone outreach attempt made. A HIPAA compliant phone message was left for the patient providing contact information and requesting a return call.  [facility]  [facility], [facility] Care Management Assistant Direct Dial: [phone]  Fax: [phone]

## 2023-06-07 NOTE — Progress Notes (Addendum)
 Complex Care Management Care Guide Note  06/07/2023 Name: Ednah Nuhfer MRN: [id number] DOB: [birth year]  Laziyah Kwiecinski is a 53 y.o. year old female who is a primary care patient of [name], Kirsten, MD and is actively engaged with the care management team. I reached out to Charle Congo by phone today to assist with re-scheduling  with the RN Case Manager.  Follow up plan: Telephone appointment with complex care management team member scheduled for:  06/22/23 at 1:00 p.m.  [facility]  [facility], [facility] Care Management Assistant Direct Dial: [phone]  Fax: [phone]

## 2023-06-08 DIAGNOSIS — R948 Abnormal results of function studies of other organs and systems: Secondary | ICD-10-CM | POA: Diagnosis not present

## 2023-06-08 DIAGNOSIS — Z89432 Acquired absence of left foot: Secondary | ICD-10-CM | POA: Diagnosis not present

## 2023-06-08 DIAGNOSIS — M869 Osteomyelitis, unspecified: Secondary | ICD-10-CM | POA: Diagnosis not present

## 2023-06-10 DIAGNOSIS — R948 Abnormal results of function studies of other organs and systems: Secondary | ICD-10-CM | POA: Diagnosis not present

## 2023-06-12 ENCOUNTER — Telehealth: Payer: Self-pay

## 2023-06-12 DIAGNOSIS — B372 Candidiasis of skin and nail: Secondary | ICD-10-CM | POA: Diagnosis not present

## 2023-06-12 DIAGNOSIS — E118 Type 2 diabetes mellitus with unspecified complications: Secondary | ICD-10-CM | POA: Diagnosis not present

## 2023-06-12 DIAGNOSIS — M86672 Other chronic osteomyelitis, left ankle and foot: Secondary | ICD-10-CM | POA: Diagnosis not present

## 2023-06-12 DIAGNOSIS — Z89432 Acquired absence of left foot: Secondary | ICD-10-CM | POA: Diagnosis not present

## 2023-06-12 DIAGNOSIS — R252 Cramp and spasm: Secondary | ICD-10-CM | POA: Diagnosis not present

## 2023-06-12 NOTE — Telephone Encounter (Signed)
 ORDER FORM, OFFICE NOTE AND DEMOGRAPHICS FAXED TO [facility] 

## 2023-06-12 NOTE — Telephone Encounter (Signed)
-----   Message from [facility] Evie J sent at 06/05/2023  2:16 PM EDT ----- Regarding: RE: home health and imaging Bone scan order faxed to [facility] imaging, hopefully they will be contacting patient today to get her scheduled for Thursday. I have also sent modified HH orders to [facility]. ----- Message ----- From: Reina Cara, DPM Sent: 06/05/2023  11:05 AM EDT To: Josephina Nicks, CMA; Graeme Lawn, CMA Subject: home health and imaging                        Hello,  Need to modify the home health nursing wound care orders to 3 times a week dressing changes to the left TMA ulcer with Aquacel Ag and bordered foam dressing.  She needs help changing her dressing due to mobility issues.  Wound was 2 x 0.8 cm today, fat layer exposed.  Also I wanted to follow-up on a three-phase bone scan I ordered for her the other week.  I think we needed to schedule it through [facility] due to other location or network cost for her.  She says that she has not heard about a date for the study being scheduled.  Can we please follow-up on this?  Thank you guys, have a great day.

## 2023-06-15 ENCOUNTER — Other Ambulatory Visit: Payer: Self-pay | Admitting: Family Medicine

## 2023-06-15 DIAGNOSIS — F50819 Binge eating disorder, unspecified: Secondary | ICD-10-CM

## 2023-06-15 DIAGNOSIS — E1140 Type 2 diabetes mellitus with diabetic neuropathy, unspecified: Secondary | ICD-10-CM

## 2023-06-15 DIAGNOSIS — K7460 Unspecified cirrhosis of liver: Secondary | ICD-10-CM

## 2023-06-16 DIAGNOSIS — E11621 Type 2 diabetes mellitus with foot ulcer: Secondary | ICD-10-CM | POA: Diagnosis not present

## 2023-06-22 ENCOUNTER — Other Ambulatory Visit: Payer: Self-pay

## 2023-06-22 NOTE — Patient Outreach (Signed)
 Complex Care Management   Visit Note  06/22/2023  Name:  Linda [name] MRN: [id number] DOB: 15-Nov-1970  Situation: Referral received for Complex Care Management related to Diabetes with Complications and TMA I obtained verbal consent from Patient.  Visit completed with patient  on the phone  Background:   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 2 diabetes mellitus, with fat layer exposed (HCC) 03/27/2020   Gastritis    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 No longer has    Assessment: Patient Reported Symptoms:  Cognitive Cognitive Status: Alert and oriented to person, place, and time      Neurological Neurological Review of Symptoms: Numbness (numbness in bilateral feet, and hands) Neurological Management Strategies: Medication therapy, Routine screening  HEENT HEENT Symptoms Reported: Change or loss of hearing, Mouth or teeth  pain (will discuss audiology referral with PCP, patient to schedule dental exam for teeth pain lower teeth, has full upper dentures need refitting) HEENT Conditions: Ear problem(s) Vision Problems: blindness/vision loss (wears glasses, needs yearly exam) HEENT Management Strategies: Routine screening Ear problem(s)  Cardiovascular Cardiovascular Symptoms Reported: Swelling in legs or feet (patient feels improvement, wearing diabetic shoes) Cardiovascular Conditions: Hypertension Cardiovascular Management Strategies: Routine screening, Weight management, Fluid modification, Medication therapy Do You Have a Working Readable Scale?: No (patient will purchase with insurance OTC card next quarter)  Respiratory Respiratory Symptoms Reported: No symptoms reported    Endocrine Patient reports the following symptoms related to hypoglycemia or hyperglycemia : No symptoms reported Is patient diabetic?: Yes Is patient checking blood sugars at home?: Yes (06/22/23 fasting 139) Endocrine Conditions: Diabetes Endocrine Management Strategies: Medical device, Medication therapy, Routine screening  Gastrointestinal Gastrointestinal Symptoms Reported: Constipation, Cramping Gastrointestinal Conditions: Constipation, Other Gastrointestinal Management Strategies: Diet modification, Medication therapy Gastrointestinal Comment: patient reports Miralax  is helping with constipation, having regular BM Nutrition Risk Screen (CP): No indicators present  Genitourinary Genitourinary Symptoms Reported: No symptoms reported    Integumentary Integumentary Symptoms Reported: Wound Additional Integumentary Details: surgical amputation wound bilateral feet  has HHRN visits 3x week, regular podiatry visits Skin Conditions: Wound Skin Management Strategies: Routine screening Skin Comment: had sacral PU, has now healed  Musculoskeletal Musculoskelatal Symptoms Reviewed: Unsteady gait Musculoskeletal Conditions: Mobility  limited, Amputation, Back pain  Musculoskeletal Management Strategies: Medical device, Medication therapy, Routine screening, Coping strategies Musculoskeletal Comment: patient pending refitting for new diabetic shoes Falls in the past year?: Yes Number of falls in past year: 1 or less (no falls since last assessment) Was there an injury with Fall?: Yes Fall Risk Category Calculator: 2 Patient Fall Risk Level: Moderate Fall Risk Patient at Risk for Falls Due to: History of fall(s), Impaired balance/gait, Impaired mobility, Orthopedic patient  Psychosocial Psychosocial Symptoms Reported: No symptoms reported Additional Psychological Details: patient with depression, on medications, reports feeling stable Behavioral Health Conditions: Depression Behavioral Health Comment: patient has received resources for counseling options/patient declined AMB referral at this time Techniques to Cope with Loss/Stress/Change: Meditation        05/18/2023    1:32 PM  Depression screen PHQ 2/9  Decreased Interest 1  Down, Depressed, Hopeless 2  PHQ - 2 Score 3  Altered sleeping 2  Tired, decreased energy 2  Change in appetite 1  Feeling bad or failure about yourself  1  Trouble concentrating 1  Moving slowly or fidgety/restless 0  Suicidal thoughts 0  PHQ-9 Score 10  Difficult doing work/chores Somewhat difficult    Vitals:   06/21/23 1318  BP: 137/80    Medications Reviewed Today     Reviewed by Clarnce Crow, RN (Registered Nurse) on 06/22/23 at 1329  Med List Status: <None>   Medication Order Taking? Sig Documenting Provider Last Dose Status Informant  acetaminophen  (TYLENOL ) 500 MG tablet [id number] Yes Take 500 mg by mouth every 6 (six) hours as needed for moderate pain or mild pain. [provider] Taking Active Self, Pharmacy Records  albuterol  (VENTOLIN  HFA) 108 (90 Base) MCG/ACT inhaler [id number] Yes INHALE TWO PUFFS BY MOUTH INTO LUNGS every SIX hours orn FOR WHEEZING AND/OR  SHORTNESS OF Neena Baller, MD Taking Active Self, Pharmacy Records  amitriptyline  (ELAVIL ) 25 MG tablet [id number] Yes TAKE ONE TABLET BY MOUTH AT BEDTIME [name], Kirsten, MD Taking Active   atorvastatin  (LIPITOR) 10 MG tablet [id number] Yes TAKE ONE TABLET BY MOUTH EVERY Deanna Expose, Kirsten, MD Taking Active Self, Pharmacy Records  Blood Glucose Monitoring Suppl [facility] VERIO REFLECT) w/Device KIT [id number] Yes AS DIRECTED [provider] Taking Active Self, Pharmacy Records  Blood Pressure Monitoring [facility]) MISC [id number] Yes 1 each by Does not apply route daily in the afternoon. [name], Kirsten, MD Taking Active Self, Pharmacy Records  buPROPion  (WELLBUTRIN  XL) 300 MG 24 hr tablet [id number] Yes Take 1 tablet (300 mg total) by mouth every morning. Mercy Stall, MD Taking Active Self, Pharmacy Records  busPIRone  (BUSPAR ) 5 MG tablet [id number] Yes TAKE ONE TABLET BY MOUTH 3 TIMES DAILY [name], Kirsten, MD Taking Active Self, Pharmacy Records  Continuous Blood Gluc Receiver (FREESTYLE LIBRE 2 READER) Seymour Dapper [id number] Yes E11.69 Check blood sugar 4 times daily as directed [name], Burleigh Carp, MD Taking Active Self, Pharmacy Records  Continuous Glucose Sensor (FREESTYLE LIBRE 3 PLUS SENSOR) [place] [id number] Yes Change sensor every 15 days. CoxBurleigh Carp, MD Taking Active Self, Pharmacy Records  cyclobenzaprine  (FLEXERIL ) 10 MG tablet [id number] Yes TAKE ONE TABLET BY MOUTH EVERY 8 HOURS AS NEEDED FOR MUSCLE SPASMS [name], Kirsten, MD Taking Active Self, Pharmacy Records  dicyclomine  (BENTYL ) 20 MG tablet [id number] Yes TAKE ONE TABLET BY MOUTH BEFORE MEALS AND AT BEDTIME AS NEEDED FOR STOMACH CRAMPING Mercy Stall, MD Taking Active   FETZIMA  80 MG CP24 [id number] Yes TAKE ONE CAPSULE BY MOUTH EVERY Fleeta Hull, MD Taking Active Self, Pharmacy Records  gabapentin  (NEURONTIN ) 400 MG capsule [id number] Yes TAKE 2 CAPSULES BY MOUTH 3 TIMES DAILY Sirivol, Mamatha, MD Taking Active   gabapentin   (NEURONTIN ) 600 MG tablet [id number]  Take 1 tablet (600 mg total) by mouth 3 (three) times daily for 14 days.  Patient not taking: Reported on 05/03/2023   Janece Means, FNP  Expired 05/05/23 2359 Self, Pharmacy Records           Med Note [facility], [place] A   Wed Apr 19, 2023  9:52 AM) Patient states she was taking this prior to hospitalization and has in her pill packs  glucose blood test strip [id number] Yes Use as instructed to check FBS daily. E11.40 CoxBurleigh Carp, MD Taking Active Self, Pharmacy Records  Lancets [facility] Jewelene Morton PLUS [place]) [place] [id number] Yes USE TO check blood glucose 2-3 times daily AS Henderson Lock, MD Taking Active Self, Pharmacy Records  levothyroxine  (SYNTHROID ) 75 MCG tablet [id number] Yes Take 1 tablet (75 mcg total) by mouth daily before breakfast. Mercy Stall, MD Taking Active Self, Pharmacy Records  losartan  (COZAAR ) 50 MG tablet [id number] Yes TAKE ONE TABLET BY MOUTH EVERY Fleeta Hull, MD Taking Active   Magnesium  500 MG CAPS [id number] Yes Take 1 capsule (500 mg total) by mouth daily. [name], Kirsten, MD Taking Active Self, Pharmacy Records  metoprolol  succinate (TOPROL -XL) 25 MG 24 hr tablet [id number] Yes TAKE 1/2 TABLET BY MOUTH EVERY DAY [name], Kirsten, MD Taking Active   morphine  (MS CONTIN ) 30 MG 12 hr tablet [id number] Yes Take 1 tablet (30 mg total) by mouth every 12 (twelve) hours. Cyndi Drain, PA-C Taking Active   Multiple Vitamin (MULTIVITAMIN WITH MINERALS) TABS tablet [id number] Yes Take 1 tablet by mouth daily. Solar ray [provider] Taking Active Self, Pharmacy Records           Med Note [facility]   Tue Mar 01, 2022  4:02 PM)    nystatin  (MYCOSTATIN /NYSTOP ) powder [id number] Yes Apply 1 Application topically 3 (three) times daily. Janece Means, FNP Taking Active Self, Pharmacy Records           Med Note (STEFFENS, MICHELLE P   Thu May 18, 2023  2:01 PM) Patient is taking as needed  ondansetron  (ZOFRAN ) 4 MG tablet  [id number] Yes Take 1 tablet (4 mg total) by mouth every 4 (four) hours as needed for nausea. Mercy Stall, MD Taking Active Self, Pharmacy Records           Med Note [facility], [place] A   Wed Apr 19, 2023 10:01 AM) Patient states she has only used once in the past week - takes as needed  OVER THE COUNTER MEDICATION [id number]  Take 1 tablet by mouth in the morning and at bedtime. Lutein for eyes [provider]  Active Self, Pharmacy Records  oxyCODONE  (OXY IR/ROXICODONE ) 5 MG immediate release tablet [id number] No Take 1 tablet (5 mg total) by mouth every 4 (four) hours as needed for moderate pain (pain score 4-6).  Patient not taking: Reported on 06/22/2023   Oral Billings, MD Not Taking Active            Med Note Burley Carpenter, [place]ett [place]   Thu Jun 22, 2023  1:27 PM) Patient reports was given at d/c from hospital, no longer needed, has MS Contin  ordered instead  pantoprazole  (PROTONIX ) 40 MG tablet [id number] Yes TAKE ONE TABLET BY MOUTH TWICE DAILY [name], Kirsten, MD Taking Active   polyethylene glycol (MIRALAX  / GLYCOLAX ) 17 g packet [id number]  Yes Take 17 g by mouth daily as needed for moderate constipation. [provider] Taking Active Self, Pharmacy Records           Med Note [facility], [place] A   Wed Apr 19, 2023 10:02 AM) Patient reports moving bowels - takes as needed  potassium chloride  (MICRO-K ) 10 MEQ CR capsule [id number] Yes Take 2 capsules (20 mEq total) by mouth 2 (two) times daily. Mercy Stall, MD Taking Active Self, Pharmacy Records  Probiotic Product (PROBIOTIC PO) [id number] Yes Take 1 capsule by mouth in the morning. 3.2 billion cfu [provider] Taking Active Self, Pharmacy Records  prochlorperazine  (COMPAZINE ) 5 MG tablet [id number] Yes TAKE ONE TABLET BY MOUTH every SIX hours AS NEEDED FOR NAUSEA AND VOMITING [name], Kirsten, MD Taking Active Self, Pharmacy Records           Med Note [facility], [place] A   Wed Apr 19, 2023 10:03 AM) Patient states she has not needed  "in a while"  ramelteon  (ROZEREM ) 8 MG tablet [id number] Yes TAKE ONE TABLET BY MOUTH AT BEDTIME [name], Burleigh Carp, MD Taking Active Self, Pharmacy Records  rOPINIRole  (REQUIP ) 0.25 MG tablet [id number] Yes TAKE ONE TABLET BY MOUTH AT BEDTIME [name], Kirsten, MD Taking Active Self, Pharmacy Records  SURE COMFORT PEN NEEDLES 32G X 4 MM MISC [id number] Yes Use new needle with each injection. Mercy Stall, MD Taking Active   SYNJARDY  12.05-998 MG TABS [id number] Yes TAKE ONE TABLET BY MOUTH TWICE DAILY Cyndi Drain, PA-C Taking Active   tirzepatide  (MOUNJARO ) 10 MG/0.5ML Pen [id number] Yes Inject 10 mg into the skin once a week. Mercy Stall, MD Taking Active            Med Note Burley Carpenter, Kala Gassmann   Thu Jun 22, 2023  1:29 PM) Patient still taking 7.5 mg, waiting for delivery of 10mg  not yet available at pharmacy  TRESIBA  FLEXTOUCH 200 UNIT/ML FlexTouch Pen [id number] Yes INJECT 60 UNITS SUBCUTANEOUSLY DAILY [name], Kirsten, MD Taking Active   Vitamin D , Ergocalciferol , (DRISDOL ) 1.25 MG (50000 UNIT) CAPS capsule [id number] Yes TAKE ONE CAPSULE BY MOUTH EVERY FRIDAY AT Isadora Mar, MD Taking Active Self, Pharmacy Records  VRAYLAR  3 MG capsule [id number] Yes TAKE ONE CAPSULE BY MOUTH EVERY DAY Cyndi Drain, PA-C Taking Active   VYVANSE  70 MG capsule [id number] Yes TAKE ONE CAPSULE BY MOUTH EVERY DAY Cyndi Drain, PA-C Taking Active             Recommendation:   PCP Follow-up  Follow Up Plan:   Telephone follow up appointment date/time:  07/06/23 at 10:00   Clarnce Crow BSN RN CCM [place]  [facility] RN Care Manager Direct Dial: [phone] Fax: [phone]

## 2023-06-22 NOTE — Progress Notes (Signed)
 [facility]  [address] 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 She was treated with bilateral mastectomy due to BRCA1 mutation.  She completed 4 cycles of Adriamycin /cyclophosphamide  chemotherapy and received 8 out of 12 planned weeks of weekly paclitaxel .  She remains without evidence of recurrence.  BRCA1 positive Positive BRCA 1 mutation, which increases her risk for breast and ovarian cancer, as well as elevates her risk for pancreatic cancer.  She underwent hysterectomy/bilateral salpingo-oophorectomy.  The hepatologist recommended biannual screening for hepatocellular carcinoma with ultrasound alternating with cross-sectional imaging to allow screening for pancreatic cancer.  CT abdomen in August 2024 did not reveal any evidence of malignancy.  Right upper quadrant ultrasound in March did not reveal any evidence of malignancy.  She will have repeat CT abdomen in August.   Liver cirrhosis secondary to NASH (nonalcoholic steatohepatitis) (HCC) Liver cirrhosis as seen on CT imaging in August 2020.  She was referred to [facility], CRNP, at the [facility] in [place].  Hepatitis panel was negative.  She received the appropriate vaccines.  Ms. Chauncey Cora recommended every 6 month screening for hepatocellular cancer with ultrasound alternating with cross-sectional imaging to allow for screening for pancreatic cancer.  Most recent imaging with CT abdomen in August 2024 did not reveal any evidence of malignancy.  Right upper quadrant ultrasound in March did not reveal any evidence of malignancy.  AFP was normal.  I  will schedule her for repeat CT abdomen in August.  Idiopathic thrombocytopenic purpura (ITP) (HCC) Thrombocytopenia, which is felt to be due to chronic ITP and liver cirrhosis.  Her platelet count has fluctuated up and down her last platelet count at her last visit in March was 80,000, so in good range.  She continues to have regular blood work at Dr. [name] office.  The platelets were 64,000 and mid-March, 126,000 in early April and 76,000 in early May. CBC on June 3 revealed platelets of 69,000, which is in the same range as previous. She did have an episode of abnormal bruising of the left mastectomy site about 2 weeks.  She denies abnormal bleeding.    The patient understands the plans discussed today and is in agreement with them.  She knows to contact our office if she develops concerns prior to her next appointment.   I provided 20 minutes of face-to-face time during this encounter and > 50% was spent counseling as documented under my assessment and plan.    Travonne Schowalter A Williard Keller, PA-C  [facility] [place] - A DEPT OF [facility] [address] [place] [address] Dept: [phone] Dept Fax: [phone]   Orders Placed This Encounter  Procedures   CT ABDOMEN W CONTRAST    Standing Status:   Future    Expected Date:   09/20/2023    Expiration Date:   06/29/2024    Scheduling Instructions:     RH    If indicated for the ordered procedure, I authorize the administration of contrast media per Radiology protocol:   Yes    Does the patient have a contrast media/X-ray dye allergy?:   No  Is patient pregnant?:   No    Preferred imaging location?:   External    If indicated for the ordered procedure, I authorize the administration of oral contrast media per Radiology protocol:   Yes   CBC with Differential (Cancer Center Only)    Standing Status:   Future    Expected Date:   09/20/2023    Expiration Date:   06/29/2024   CMP (Cancer Center  only)    Standing Status:   Future    Expected Date:   09/20/2023    Expiration Date:   06/29/2024   AFP tumor marker    Standing Status:   Future    Expected Date:   09/20/2023    Expiration Date:   06/29/2024      CHIEF COMPLAINT:  CC: Stage IB triple negative breast cancer  Current Treatment: Surveillance  HISTORY OF PRESENT ILLNESS:   Oncology History  Malignant neoplasm of lower-inner quadrant of right breast of female, estrogen receptor negative (HCC)  09/23/2019 Cancer Staging   Staging form: Breast, AJCC 8th Edition - Clinical stage from 09/23/2019: Stage IB (cT1c(2), cN0(sn), cM0, G3, ER-, PR-, HER2-) - Signed by Nolia Baumgartner, MD on 12/23/2019 Staging comments: Bilateral mastectomies,  BRCA 1 pos.   10/01/2019 Initial Diagnosis   Malignant neoplasm of lower-inner quadrant of right breast of female, estrogen receptor negative (HCC)   11/18/2019 - 04/01/2020 Chemotherapy         Hypokalemia (Resolved)  Dehydration (Resolved)      INTERVAL HISTORY: Denell is here today for repeat clinical assessment. She states about 2 weeks ago she was awakened by pain and had a large bruise of the fatty tissue adjacent to the left mastectomy site. She continues to report tenderness in the area, but the bruising has nearly resolved.  She denies injury prior to this episode.  She denies abnormal bleeding.. 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 Since surgical removal of most toes, she occasionally has pain in the feet.  She uses a rolling walker for stability.  She denies fevers, chills or night sweats. Her appetite is good. Her weight has been stable.  She had a right upper quadrant ultrasound in March, which revealed cirrhotic appearing liver, no evidence of malignancy.  REVIEW OF SYSTEMS:  Review of Systems  Constitutional:  Negative for appetite change, chills, fatigue, fever and unexpected weight change.  HENT:   Negative for lump/mass, mouth sores, nosebleeds, sore  throat and trouble swallowing.   Respiratory:  Negative for cough, hemoptysis and shortness of breath.   Cardiovascular:  Negative for chest pain and leg swelling.  Gastrointestinal:  Positive for nausea (occasional, meds effective). Negative for abdominal pain, blood in stool, constipation, diarrhea and vomiting.  Genitourinary:  Negative for difficulty urinating, dysuria, frequency, hematuria and vaginal bleeding.   Musculoskeletal:  Positive for gait problem (uses rolling walker for stability). Negative for arthralgias, back pain, myalgias and neck pain.  Skin:  Negative for rash.  Neurological:  Positive for gait problem (uses rolling walker for stability) and numbness (neuropathy hands and feet). Negative for dizziness, extremity weakness, headaches and light-headedness.  Hematological:  Negative for adenopathy. Bruises/bleeds easily (single episode of exessive bruising left chest fatty tissue).  Psychiatric/Behavioral:  Negative for depression and sleep disturbance. The patient is not nervous/anxious.      VITALS:   Blood pressure 106/84, pulse 100, temperature 98.7 F (37.1 C), temperature source Oral, resp. rate 20, height 5\' 2"  (1.575 m), weight 206  lb 12.8 oz (93.8 kg), last menstrual period 06/13/2009, SpO2 100%.  Wt Readings from Last 3 Encounters:  06/30/23 206 lb 12.8 oz (93.8 kg)  05/11/23 206 lb (93.4 kg)  05/03/23 206 lb (93.4 kg)    Body mass index is 37.82 kg/m.  Performance status (ECOG): 1 - Symptomatic but completely ambulatory    Physical Exam Vitals and nursing note reviewed.  Constitutional:      General: She is not in acute distress.    Appearance: Normal appearance. She is obese. She is not ill-appearing.  HENT:     Head: Normocephalic and atraumatic.     Mouth/Throat:     Mouth: Mucous membranes are moist.     Pharynx: Oropharynx is clear. No oropharyngeal exudate or posterior oropharyngeal erythema.  Eyes:     General: No scleral icterus.     Extraocular Movements: Extraocular movements intact.     Conjunctiva/sclera: Conjunctivae normal.     Pupils: Pupils are equal, round, and reactive to light.  Cardiovascular:     Rate and Rhythm: Normal rate and regular rhythm.     Heart sounds: Normal heart sounds. No murmur heard.    No friction rub. No gallop.  Pulmonary:     Effort: Pulmonary effort is normal.     Breath sounds: Normal breath sounds. No wheezing, rhonchi or rales.  Chest:  Breasts:    Right: Absent.     Left: Absent.     Comments: Bilateral mastectomy sites are negative. There is small resolving ecchymosis left chest fatty tissue without masses. Abdominal:     General: There is no distension.     Palpations: Abdomen is soft. There is hepatomegaly (liver palpable about 7 cm below right costal margin, stable) and splenomegaly (palpable about 4 cm below left costal margin). There is no mass.     Tenderness: There is no abdominal tenderness.  Musculoskeletal:        General: Normal range of motion.     Cervical back: Normal range of motion and neck supple. No tenderness.     Right lower leg: No edema.     Left lower leg: No edema.  Lymphadenopathy:     Cervical: No cervical adenopathy.     Upper Body:     Right upper body: No supraclavicular or axillary adenopathy.     Left upper body: No supraclavicular or axillary adenopathy.  Skin:    General: Skin is warm and dry.     Coloration: Skin is not jaundiced.     Findings: No rash.  Neurological:     Mental Status: She is alert and oriented to person, place, and time.     Cranial Nerves: No cranial nerve deficit.  Psychiatric:        Mood and Affect: Mood normal.        Behavior: Behavior normal.        Thought Content: Thought content normal.     LABS:      Latest Ref Rng & Units 06/30/2023    1:12 PM 06/27/2023    8:58 AM 05/29/2023   11:59 AM  CBC  WBC 4.0 - 10.5 K/uL 4.5  3.5  5.0   Hemoglobin 12.0 - 15.0 g/dL 16.1  09.6  04.5   Hematocrit 36.0 - 46.0  % 37.9  37.3  36.2   Platelets 150 - 400 K/uL 68  69  76       Latest Ref Rng & Units 06/30/2023    1:12 PM 05/03/2023  8:47 AM 04/12/2023    3:47 AM  CMP  Glucose 70 - 99 mg/dL 147  829  562   BUN 6 - 20 mg/dL 7  12  11    Creatinine 0.44 - 1.00 mg/dL 1.30  8.65  7.84   Sodium 135 - 145 mmol/L 140  140  137   Potassium 3.5 - 5.1 mmol/L 4.3  4.6  3.7   Chloride 98 - 111 mmol/L 101  102  105   CO2 22 - 32 mmol/L 25  21  25    Calcium  8.9 - 10.3 mg/dL 69.6  9.3  8.2   Total Protein 6.5 - 8.1 g/dL 6.9  6.4  5.5   Total Bilirubin 0.0 - 1.2 mg/dL 0.5  0.4  0.5   Alkaline Phos 38 - 126 U/L 132  138  51   AST 15 - 41 U/L 19  20  15    ALT 0 - 44 U/L 16  12  13      Lab Results  Component Value Date   TIBC 339 03/29/2023   TIBC 312 05/20/2020   TIBC 300 01/13/2020   FERRITIN 20 03/29/2023   FERRITIN 98 05/20/2020   IRONPCTSAT 17 03/29/2023   IRONPCTSAT 26 05/20/2020   IRONPCTSAT 24.3 01/13/2020     STUDIES:  DG Foot Complete Left Result Date: 06/26/2023 Please see detailed radiograph report in office note.     HISTORY:   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 2 diabetes mellitus, with fat layer exposed (HCC) 03/27/2020   Gastritis    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 No longer has    Past Surgical History:  Procedure Laterality Date   ACHILLES TENDON LENGTHENING Left 04/09/2023   Procedure: LENGTHENING, TENDON, ACHILLES;  Surgeon: Reina Cara, DPM;  Location: [facility] OR;  Service: Podiatry;  Laterality: Left;   AMPUTATION TOE Bilateral 03/04/2022   Procedure: AMPUTATION TOE RIGHT FOOT PARTIAL OR TOTAL GREAT TOE AND SECOND TOE, LEFT FOOT PARTIAL OR TOTAL GREAT TOE, SECOND AND THIRD TOE;  Surgeon: Evertt Hoe, DPM;  Location: [facility] OR;  Service: Podiatry;  Laterality: Bilateral;   AMPUTATION TOE Right 01/04/2023   Procedure: R 1ST TOE AMPUTATION, RIGHT 3RD TOE AMPUTATION;  Surgeon: Rosemarie Conquest,  Karlene Overcast, DPM;  Location: [facility]S;  Service: Orthopedics/Podiatry;  Laterality: Right;   APPENDECTOMY     BACK SURGERY     x 3 lower disc   BILATERAL TOTAL MASTECTOMY WITH AXILLARY LYMPH NODE DISSECTION Bilateral 09/2019   CHOLECYSTECTOMY     nephrolithiasis     ROBOTIC ASSISTED TOTAL HYSTERECTOMY WITH BILATERAL SALPINGO OOPHERECTOMY Bilateral 06/14/2016   Procedure: ROBOTIC ASSISTED TOTAL HYSTERECTOMY WITH BILATERAL SALPINGO OOPHORECTOMY;  Surgeon: Andra Kava, MD;  Location: [facility];  Service: Gynecology;  Laterality: Bilateral;   TRANSMETATARSAL AMPUTATION Left 04/09/2023   Procedure: LEFT TRANSMETATARSAL AMPUTATION;  Surgeon: Reina Cara, DPM;  Location: [facility] OR;  Service: Podiatry;  Laterality: Left;    Family History  Problem Relation Age of Onset   Breast cancer Mother        breast   CAD Father    Diabetes Father    Heart failure Father    Cancer Father        renal carcinoma   Kidney failure  Father    Kidney cancer Father    Kidney cancer Brother 58       renal carcinoma.   Stroke Paternal Grandmother     Social History:  reports that she has never smoked. She has never used smokeless tobacco. She reports that she does not drink alcohol and does not use drugs.The patient is alone today.  Allergies:  Allergies  Allergen Reactions   Codeine Other (See Comments) and Shortness Of Breath    Other reaction(s): SHOB   Amoxicillin -Pot Clavulanate Diarrhea   Celecoxib Other (See Comments)    Unknown reaction   Ezetimibe-Simvastatin Other (See Comments)    Unknown reaction  Patient is not aware of an allergy to this medication, ask   Propranolol Other (See Comments)    Other reaction(s): Unknown  Other Reaction(s): dizziness, fatigue, hypotension, nightmares   Propranolol Hcl Other (See Comments)    Unknown reaction  ask   Simvastatin Other (See Comments)    Other reaction(s): Unknown   Zetia [Ezetimibe] Other (See Comments)    Other reaction(s): Unknown    Current Medications: Current Outpatient Medications  Medication Sig Dispense Refill   clotrimazole -betamethasone  (LOTRISONE ) cream Apply topically 2 (two) times daily.     acetaminophen  (TYLENOL ) 500 MG tablet Take 500 mg by mouth every 6 (six) hours as needed for moderate pain or mild pain.     albuterol  (VENTOLIN  HFA) 108 (90 Base) MCG/ACT inhaler INHALE TWO PUFFS BY MOUTH INTO LUNGS every SIX hours orn FOR WHEEZING AND/OR SHORTNESS OF BREATH 8.5 g 1   amitriptyline  (ELAVIL ) 25 MG tablet TAKE ONE TABLET BY MOUTH AT BEDTIME 30 tablet 1   atorvastatin  (LIPITOR) 10 MG tablet TAKE ONE TABLET BY MOUTH EVERY EVENING 90 tablet 1   Blood Glucose Monitoring Suppl (ONETOUCH VERIO REFLECT) w/Device KIT AS DIRECTED     Blood Pressure Monitoring (SPHYGMOMANOMETER) MISC 1 each by Does not apply route daily in the afternoon. 1 each 0   buPROPion  (WELLBUTRIN  XL) 300 MG 24 hr tablet Take 1 tablet (300 mg total) by mouth every  morning. 90 tablet 3   busPIRone  (BUSPAR ) 5 MG tablet TAKE ONE TABLET BY MOUTH 3 TIMES DAILY 90 tablet 3   Continuous Blood Gluc Receiver (FREESTYLE LIBRE 2 READER) DEVI E11.69 Check blood sugar 4 times daily as directed 1 each 0   Continuous Glucose Sensor (FREESTYLE LIBRE 3 PLUS SENSOR) MISC Change sensor every 15 days. 6 each 1   cyclobenzaprine  (FLEXERIL ) 10  MG tablet TAKE ONE TABLET BY MOUTH EVERY 8 HOURS AS NEEDED FOR MUSCLE SPASMS 270 tablet 1   dicyclomine  (BENTYL ) 20 MG tablet TAKE ONE TABLET BY MOUTH BEFORE MEALS AND AT BEDTIME AS NEEDED FOR STOMACH CRAMPING 180 tablet 1   Empagliflozin-metFORMIN HCl (SYNJARDY ) 12.05-998 MG TABS Take 1 tablet by mouth 2 (two) times daily. 60 tablet 3   FETZIMA  80 MG CP24 TAKE ONE CAPSULE BY MOUTH EVERY EVENING 90 capsule 1   gabapentin  (NEURONTIN ) 400 MG capsule TAKE 2 CAPSULES BY MOUTH 3 TIMES DAILY 180 capsule 1   glucose blood test strip Use as instructed to check FBS daily. E11.40 100 each 12   Lancets (ONETOUCH DELICA PLUS LANCET30G) MISC USE TO check blood glucose 2-3 times daily AS DIRECTED 100 each 3   levothyroxine  (SYNTHROID ) 75 MCG tablet Take 1 tablet (75 mcg total) by mouth daily before breakfast. 90 tablet 3   losartan  (COZAAR ) 50 MG tablet TAKE ONE TABLET BY MOUTH EVERY EVENING 90 tablet 1   Magnesium  500 MG CAPS Take 1 capsule (500 mg total) by mouth daily. 90 capsule 1   metoprolol  succinate (TOPROL -XL) 25 MG 24 hr tablet TAKE 1/2 TABLET BY MOUTH EVERY DAY 45 tablet 1   morphine  (MS CONTIN ) 30 MG 12 hr tablet Take 1 tablet (30 mg total) by mouth every 12 (twelve) hours. 60 tablet 0   Multiple Vitamin (MULTIVITAMIN WITH MINERALS) TABS tablet Take 1 tablet by mouth daily. Solar ray     mupirocin  ointment (BACTROBAN ) 2 % Apply 1 Application topically 2 (two) times daily. 30 g 2   nystatin  (MYCOSTATIN /NYSTOP ) powder Apply 1 Application topically 3 (three) times daily. 15 g 0   ondansetron  (ZOFRAN ) 4 MG tablet Take 1 tablet (4 mg total) by  mouth every 4 (four) hours as needed for nausea. 90 tablet 3   OVER THE COUNTER MEDICATION Take 1 tablet by mouth in the morning and at bedtime. Lutein for eyes     pantoprazole  (PROTONIX ) 40 MG tablet TAKE ONE TABLET BY MOUTH TWICE DAILY 180 tablet 1   polyethylene glycol (MIRALAX  / GLYCOLAX ) 17 g packet Take 17 g by mouth daily as needed for moderate constipation.     potassium chloride  (MICRO-K ) 10 MEQ CR capsule Take 2 capsules (20 mEq total) by mouth 2 (two) times daily. 360 capsule 3   Probiotic Product (PROBIOTIC PO) Take 1 capsule by mouth in the morning. 3.2 billion cfu     prochlorperazine  (COMPAZINE ) 5 MG tablet TAKE ONE TABLET BY MOUTH every SIX hours AS NEEDED FOR NAUSEA AND VOMITING 30 tablet 1   ramelteon  (ROZEREM ) 8 MG tablet TAKE ONE TABLET BY MOUTH AT BEDTIME 90 tablet 1   rOPINIRole  (REQUIP ) 0.25 MG tablet TAKE ONE TABLET BY MOUTH AT BEDTIME 90 tablet 1   SURE COMFORT PEN NEEDLES 32G X 4 MM MISC Use new needle with each injection. 100 each 1   tirzepatide  (MOUNJARO ) 10 MG/0.5ML Pen Inject 10 mg into the skin once a week. (Patient taking differently: Inject 10 mg into the skin once a week.) 6 mL 0   TRESIBA  FLEXTOUCH 200 UNIT/ML FlexTouch Pen INJECT 60 UNITS SUBCUTANEOUSLY DAILY 9 mL 3   Vitamin D , Ergocalciferol , (DRISDOL ) 1.25 MG (50000 UNIT) CAPS capsule TAKE ONE CAPSULE BY MOUTH EVERY FRIDAY AT 8AM 12 capsule 1   VRAYLAR  3 MG capsule TAKE ONE CAPSULE BY MOUTH EVERY DAY 90 capsule 0   VYVANSE  70 MG capsule TAKE ONE CAPSULE BY MOUTH EVERY DAY 30  capsule 0   No current facility-administered medications for this visit.   Facility-Administered Medications Ordered in Other Visits  Medication Dose Route Frequency Provider Last Rate Last Admin   sodium chloride  flush (NS) 0.9 % injection 10 mL  10 mL Intracatheter PRN Carmon Brigandi A, PA-C   10 mL at 02/19/21 1004

## 2023-06-22 NOTE — Patient Instructions (Signed)
 Visit Information  Thank you for taking time to visit with me today. Please don't hesitate to contact me if I can be of assistance to you before our next scheduled appointment.  Our next appointment is by telephone on 07/06/23 at 10:00 Please call the care guide team at [phone] if you need to cancel or reschedule your appointment.   Following is a copy of your care plan:   Goals Addressed             This Visit's Progress    VBCI RN Care Plan   On track    Problems:  Chronic Disease Management support and education needs related to DMII and recent amputation (L third toe)  Goal: Over the next 30 days the Patient will attend all scheduled medical appointments: podiatry as evidenced by completed visit notes uploaded to EMR        demonstrate Ongoing adherence to prescribed treatment plan for DMII as evidenced by compliance with continuous glucose monitor, diet as advised by provider, and exercise take all medications exactly as prescribed and will call provider for medication related questions as evidenced by medication adherence    Continue to work with home health RN towards wound care/healing  Interventions:   Diabetes Interventions: Assessed patient's understanding of A1c goal: <7% Reviewed medications with patient and discussed importance of medication adherence Discussed plans with patient for ongoing care management follow up and provided patient with direct contact information for care management team Provided patient with written educational materials related to hypo and hyperglycemia and importance of correct treatment Advised patient, providing education and rationale, to check cbg   and record, calling primary provider for findings outside established parameters Discussed eating small, frequent meals to help with blood sugar management Lab Results  Component Value Date   HGBA1C 8.0 (H) 05/03/2023    Patient Self-Care Activities:  Attend all scheduled provider  appointments Call provider office for new concerns or questions  Perform all self care activities independently  Take medications as prescribed   keep appointment with eye doctor check blood sugar at prescribed times: using continuous glucose monitor check feet daily for cuts, sores or redness Patient to schedule 6 month dental exam Patient to schedule yearly eye exam  Plan:  Telephone follow up appointment with care management team member scheduled for:  07/06/23 at 10:00 AM             Please call the Suicide and Crisis Lifeline: 988 call the [place]  National Suicide Prevention Lifeline: 862-752-4498 or TTY: 9375614061 TTY 513-460-4602) to talk to a trained counselor call 1-800-273-TALK (toll free, 24 hour hotline) if you are experiencing a Mental Health or Behavioral Health Crisis or need someone to talk to.  Patient verbalizes understanding of instructions and care plan provided today and agrees to view in MyChart. Active MyChart status and patient understanding of how to access instructions and care plan via MyChart confirmed with patient.      Clarnce Crow BSN RN CCM [place]  [facility] RN Care Manager Direct Dial: [phone] Fax: [phone]

## 2023-06-26 ENCOUNTER — Ambulatory Visit (INDEPENDENT_AMBULATORY_CARE_PROVIDER_SITE_OTHER): Admitting: Podiatry

## 2023-06-26 ENCOUNTER — Ambulatory Visit (INDEPENDENT_AMBULATORY_CARE_PROVIDER_SITE_OTHER)

## 2023-06-26 ENCOUNTER — Encounter: Payer: Self-pay | Admitting: Podiatry

## 2023-06-26 DIAGNOSIS — L97422 Non-pressure chronic ulcer of left heel and midfoot with fat layer exposed: Secondary | ICD-10-CM

## 2023-06-26 DIAGNOSIS — M2041 Other hammer toe(s) (acquired), right foot: Secondary | ICD-10-CM | POA: Diagnosis not present

## 2023-06-26 DIAGNOSIS — M86272 Subacute osteomyelitis, left ankle and foot: Secondary | ICD-10-CM

## 2023-06-26 MED ORDER — MUPIROCIN 2 % EX OINT: 1.0000 | TOPICAL_OINTMENT | Freq: Two times a day (BID) | CUTANEOUS | 2 refills | Status: DC

## 2023-06-26 NOTE — Patient Instructions (Addendum)
 More silicone and felt pads can be purchased from:  [email or web address]  You may be able to find horseshoe pads or callus pads from [place], [employer], 38M.  Paint Betadine to sulcus of right foot toes and apply Tinactin powder daily.

## 2023-06-26 NOTE — Progress Notes (Signed)
 Chief Complaint  Patient presents with   Wound Check    Wound check for the left ulcer. It is scabbed completely over. Looks better then it has. She also states that the right foot is sore. I looked under the toes and dont see anything but the lighting is bad.   Date of surgery: 04/09/2023 Procedure: Left foot TMA with TAL   HPI: 53 y.o. female presents today for wound check of left foot TMA lateral incision ulcer.  This area is scabbed over.  She denies pain and believes she is doing well with this.  Did recently have a bone scan done which was ordered due to persistently elevated inflammatory markers, prior redness to the TMA site.  She does report concern of the right foot remaining 4th and 5th toes which do have significant hammertoe deformity.  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 2 diabetes mellitus, with fat layer exposed (HCC) 03/27/2020   Gastritis    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 No longer has    Past Surgical History:  Procedure Laterality Date   ACHILLES TENDON LENGTHENING Left 04/09/2023   Procedure: LENGTHENING, TENDON, ACHILLES;  Surgeon: Reina Cara, DPM;  Location: [facility];  Service: Podiatry;  Laterality: Left;   AMPUTATION TOE Bilateral 03/04/2022   Procedure: AMPUTATION TOE RIGHT FOOT PARTIAL OR TOTAL GREAT TOE AND SECOND TOE, LEFT FOOT PARTIAL OR TOTAL GREAT TOE, SECOND AND THIRD TOE;  Surgeon: Evertt Hoe, DPM;  Location: [facility];  Service: Podiatry;  Laterality: Bilateral;   AMPUTATION TOE Right 01/04/2023   Procedure: R 1ST TOE AMPUTATION, RIGHT 3RD TOE AMPUTATION;  Surgeon: Evertt Hoe, DPM;  Location: [facility];  Service: Orthopedics/Podiatry;  Laterality: Right;   APPENDECTOMY     BACK SURGERY     x 3 lower disc   BILATERAL TOTAL MASTECTOMY WITH AXILLARY LYMPH NODE DISSECTION Bilateral 09/2019   CHOLECYSTECTOMY     nephrolithiasis     ROBOTIC ASSISTED TOTAL HYSTERECTOMY WITH BILATERAL SALPINGO OOPHERECTOMY Bilateral 06/14/2016   Procedure: ROBOTIC ASSISTED TOTAL HYSTERECTOMY WITH BILATERAL SALPINGO OOPHORECTOMY;  Surgeon: Andra Kava, MD;  Location: [facility];  Service: Gynecology;  Laterality: Bilateral;   TRANSMETATARSAL AMPUTATION Left 04/09/2023  Procedure: LEFT TRANSMETATARSAL AMPUTATION;  Surgeon: Reina Cara, DPM;  Location: [facility];  Service: Podiatry;  Laterality: Left;    Allergies  Allergen Reactions   Codeine Shortness Of Breath and Other (See Comments)    Other reaction(s): SHOB    Amoxicillin -Pot Clavulanate Diarrhea   Celebrex [Celecoxib] Other (See Comments)    Unknown reaction   Propranolol Other (See Comments)    Other reaction(s): Unknown   Propranolol Hcl Other (See Comments)    Unknown reaction  ask   Simvastatin Other (See Comments)    Other reaction(s): Unknown   Vytorin [Ezetimibe-Simvastatin] Other (See Comments)    Unknown  reaction  Patient is not aware of an allergy to this medication, ask   Zetia [Ezetimibe] Other (See Comments)    Other reaction(s): Unknown    ROS    Physical Exam: There were no vitals filed for this visit.  General: The patient is alert and oriented x3 in no acute distress.  Dermatology: Majority of left foot TMA incision is well-healed.  There is area of superficial scab lateral aspect of the incision.  This was removed to reveal superficial area of partial-thickness skin breakdown about 0.3 cm in diameter.  No surrounding erythema.  No drainage.  Right 4th and 5th toes maceration noted within the sulcus. Some localized erythema here.   Vascular: Palpable pedal pulses bilaterally. Capillary refill within normal limits.  No appreciable edema.  No diffuse erythema or calor.  Neurological: Light touch sensation grossly intact bilateral feet.  Protective sensation decreased  Musculoskeletal Exam: Status post left foot TMA with TAL.  Right foot remaining 4th and 5th toes semireducible hammertoe contracture  Radiographic Exam: Left foot 3 views weightbearing 06/26/2023 Surgical changes status post left foot transmetatarsal amputation noted. Stable appearing in AP and Lateral view. Distal metatarsals are well-visualized without obvious signs of lucency, cortical erosion or periosteal reaction. Amorphous cloudy appearance in the soft tissues seen in prior radiographs consolidating resembling heterotopic ossification noted.   Three Phase Bone Scan [facility] May of 2025 Reviewed outside imaging from prior order.  There was some increased uptake in all 3 phases.  This could favor osteomyelitis or developing heterotopic ossification as well.  Assessment/Plan of Care: 1. Ulcer of left midfoot with fat layer exposed (HCC)   2. Subacute osteomyelitis of left foot (HCC)   3. Hammertoe of right foot      Meds ordered this encounter  Medications   mupirocin  ointment (BACTROBAN ) 2 %     Sig: Apply 1 Application topically 2 (two) times daily.    Dispense:  30 g    Refill:  2   None  Discussed clinical findings with patient today.  # Left foot TMA with lateral dehiscence - Area of dehiscence healing well with scab debrided, underlying area superficial at this point. -Advised the patient apply mupirocin  to this area daily, dressed with bandage and apply offloading padding.  Bactroban  and horseshoe pad applied today. - No clinical signs of acute left foot infection.  Some mild focal warmth increased but swelling well-controlled and no erythema, no pain. - Reviewed radiographs and three-phase bone scan results with patient.  Will obtain new CBC, sed rate and CRP now that patient has been off antibiotics for some time now.  If inflammatory markers remain elevated, will consider ID referral, otherwise favoring heterotopic ossification seen on radiographs  # Right 4th and 5th hammertoes - Possible developing tinea infection to sulcus with some maceration present - Cleaned these areas with  Betadine - Instructed patient to clean with Betadine and apply a topical antifungal medication such as Tinactin powder daily over the next couple weeks.  Contracture likely trapping moisture within the sulcus. - Will plan for flexor tenotomy of these toes if stable at follow-up. -I certify that this diagnosis represents a distinct and separate diagnosis that requires evaluation and treatment separate from other procedures or diagnosis, and is unrelated to patient's transmetatarsal amputation.   Follow-up in 3 weeks   Matayah Reyburn L. Lunda Salines, AACFAS [facility]     [address] [place], [place] [address]                Office [phone]  Fax [phone]

## 2023-06-27 DIAGNOSIS — M86272 Subacute osteomyelitis, left ankle and foot: Secondary | ICD-10-CM | POA: Diagnosis not present

## 2023-06-28 LAB — CBC WITH DIFFERENTIAL/PLATELET
Basophils Absolute: 0 10*3/uL (ref 0.0–0.2)
Basos: 0 %
EOS (ABSOLUTE): 0.1 10*3/uL (ref 0.0–0.4)
Eos: 3 %
Hematocrit: 37.3 % (ref 34.0–46.6)
Hemoglobin: 11.2 g/dL (ref 11.1–15.9)
Immature Grans (Abs): 0 10*3/uL (ref 0.0–0.1)
Immature Granulocytes: 0 %
Lymphocytes Absolute: 1 10*3/uL (ref 0.7–3.1)
Lymphs: 30 %
MCH: 25.7 pg — ABNORMAL LOW (ref 26.6–33.0)
MCHC: 30 g/dL — ABNORMAL LOW (ref 31.5–35.7)
MCV: 86 fL (ref 79–97)
Monocytes Absolute: 0.2 10*3/uL (ref 0.1–0.9)
Monocytes: 6 %
Neutrophils Absolute: 2.2 10*3/uL (ref 1.4–7.0)
Neutrophils: 61 %
Platelets: 69 10*3/uL — CL (ref 150–450)
RBC: 4.35 x10E6/uL (ref 3.77–5.28)
RDW: 15.9 % — ABNORMAL HIGH (ref 11.7–15.4)
WBC: 3.5 10*3/uL (ref 3.4–10.8)

## 2023-06-28 LAB — SEDIMENTATION RATE: Sed Rate: 18 mm/h (ref 0–40)

## 2023-06-28 LAB — C-REACTIVE PROTEIN: CRP: 17 mg/L — ABNORMAL HIGH (ref 0–10)

## 2023-06-28 NOTE — Assessment & Plan Note (Addendum)
 Positive BRCA 1 mutation, which increases her risk for breast and ovarian cancer, as well as elevates her risk for pancreatic cancer.  She underwent hysterectomy/bilateral salpingo-oophorectomy.  The hepatologist recommended biannual screening for hepatocellular carcinoma with ultrasound alternating with cross-sectional imaging to allow screening for pancreatic cancer.  CT abdomen in August 2024 did not reveal any evidence of malignancy.  Right upper quadrant ultrasound in March did not reveal any evidence of malignancy.Aaron Aas

## 2023-06-28 NOTE — Assessment & Plan Note (Signed)
 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  Her platelet count has fluctuated up and down her last platelet count at her last visit in March was 80,000, so in good range.  She continues to have regular blood work at Dr. [name] office.  The platelets were 64,000 and mid-March, 126,000 in early April and 76,000 in early May. CBC on June 3 revealed platelets of 69,000, which is in the same range as previous.

## 2023-06-28 NOTE — Assessment & Plan Note (Signed)
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 She was treated with bilateral mastectomy due to BRCA1 mutation. She completed 4 cycles of Adriamycin/cyclophosphamide chemotherapy and received 8 out of 12 planned weeks of weekly paclitaxel.  She remains without evidence of recurrence.

## 2023-06-28 NOTE — Assessment & Plan Note (Addendum)
 Liver cirrhosis as seen on CT imaging in August 2020.  She was referred to [facility], CRNP, at the [facility] in [place].  Hepatitis panel was negative.  She received the appropriate vaccines.  Linda [name] recommended every 6 month screening for hepatocellular cancer with ultrasound alternating with cross-sectional imaging to allow for screening for pancreatic cancer.  Most recent imaging with CT abdomen in August 2024 did not reveal any evidence of malignancy.  Right upper quadrant ultrasound in March did not reveal any evidence of malignancy.  She will be due for CT imaging in August to screen for hepatocellular carcinoma due to cirrhosis, which will allow for pancreatic cancer screening due to her BRCA1 mutation.

## 2023-06-29 ENCOUNTER — Ambulatory Visit: Admitting: Hematology and Oncology

## 2023-06-29 ENCOUNTER — Other Ambulatory Visit

## 2023-06-29 DIAGNOSIS — H52203 Unspecified astigmatism, bilateral: Secondary | ICD-10-CM | POA: Diagnosis not present

## 2023-06-29 DIAGNOSIS — H2513 Age-related nuclear cataract, bilateral: Secondary | ICD-10-CM | POA: Diagnosis not present

## 2023-06-29 DIAGNOSIS — E119 Type 2 diabetes mellitus without complications: Secondary | ICD-10-CM | POA: Diagnosis not present

## 2023-06-29 LAB — HM DIABETES EYE EXAM

## 2023-06-30 ENCOUNTER — Encounter: Payer: Self-pay | Admitting: Podiatry

## 2023-06-30 ENCOUNTER — Other Ambulatory Visit: Payer: Self-pay | Admitting: Family Medicine

## 2023-06-30 ENCOUNTER — Inpatient Hospital Stay: Attending: Oncology

## 2023-06-30 ENCOUNTER — Encounter: Payer: Self-pay | Admitting: Hematology and Oncology

## 2023-06-30 ENCOUNTER — Other Ambulatory Visit: Payer: Self-pay | Admitting: Physician Assistant

## 2023-06-30 ENCOUNTER — Telehealth: Payer: Self-pay | Admitting: Oncology

## 2023-06-30 ENCOUNTER — Inpatient Hospital Stay (HOSPITAL_BASED_OUTPATIENT_CLINIC_OR_DEPARTMENT_OTHER): Admitting: Hematology and Oncology

## 2023-06-30 ENCOUNTER — Other Ambulatory Visit: Payer: Self-pay

## 2023-06-30 VITALS — BP 106/84 | HR 100 | Temp 98.7°F | Resp 20 | Ht 62.0 in | Wt 206.8 lb

## 2023-06-30 DIAGNOSIS — Z1509 Genetic susceptibility to other malignant neoplasm: Secondary | ICD-10-CM | POA: Diagnosis not present

## 2023-06-30 DIAGNOSIS — Z171 Estrogen receptor negative status [ER-]: Secondary | ICD-10-CM

## 2023-06-30 DIAGNOSIS — C50311 Malignant neoplasm of lower-inner quadrant of right female breast: Secondary | ICD-10-CM

## 2023-06-30 DIAGNOSIS — K7460 Unspecified cirrhosis of liver: Secondary | ICD-10-CM | POA: Diagnosis not present

## 2023-06-30 DIAGNOSIS — Z90722 Acquired absence of ovaries, bilateral: Secondary | ICD-10-CM | POA: Diagnosis not present

## 2023-06-30 DIAGNOSIS — E1140 Type 2 diabetes mellitus with diabetic neuropathy, unspecified: Secondary | ICD-10-CM

## 2023-06-30 DIAGNOSIS — K7581 Nonalcoholic steatohepatitis (NASH): Secondary | ICD-10-CM | POA: Diagnosis not present

## 2023-06-30 DIAGNOSIS — Z853 Personal history of malignant neoplasm of breast: Secondary | ICD-10-CM | POA: Diagnosis not present

## 2023-06-30 DIAGNOSIS — D693 Immune thrombocytopenic purpura: Secondary | ICD-10-CM | POA: Diagnosis not present

## 2023-06-30 DIAGNOSIS — Z1501 Genetic susceptibility to malignant neoplasm of breast: Secondary | ICD-10-CM | POA: Diagnosis not present

## 2023-06-30 DIAGNOSIS — Z90710 Acquired absence of both cervix and uterus: Secondary | ICD-10-CM | POA: Insufficient documentation

## 2023-06-30 DIAGNOSIS — Z9221 Personal history of antineoplastic chemotherapy: Secondary | ICD-10-CM | POA: Insufficient documentation

## 2023-06-30 DIAGNOSIS — Z9079 Acquired absence of other genital organ(s): Secondary | ICD-10-CM | POA: Insufficient documentation

## 2023-06-30 LAB — CBC WITH DIFFERENTIAL (CANCER CENTER ONLY)
Abs Immature Granulocytes: 0.02 10*3/uL (ref 0.00–0.07)
Basophils Absolute: 0 10*3/uL (ref 0.0–0.1)
Basophils Relative: 0 %
Eosinophils Absolute: 0.1 10*3/uL (ref 0.0–0.5)
Eosinophils Relative: 1 %
HCT: 37.9 % (ref 36.0–46.0)
Hemoglobin: 11.3 g/dL — ABNORMAL LOW (ref 12.0–15.0)
Immature Granulocytes: 0 %
Lymphocytes Relative: 26 %
Lymphs Abs: 1.2 10*3/uL (ref 0.7–4.0)
MCH: 25.2 pg — ABNORMAL LOW (ref 26.0–34.0)
MCHC: 29.8 g/dL — ABNORMAL LOW (ref 30.0–36.0)
MCV: 84.4 fL (ref 80.0–100.0)
Monocytes Absolute: 0.3 10*3/uL (ref 0.1–1.0)
Monocytes Relative: 6 %
Neutro Abs: 3 10*3/uL (ref 1.7–7.7)
Neutrophils Relative %: 67 %
Platelet Count: 68 10*3/uL — ABNORMAL LOW (ref 150–400)
RBC: 4.49 MIL/uL (ref 3.87–5.11)
RDW: 16.2 % — ABNORMAL HIGH (ref 11.5–15.5)
WBC Count: 4.5 10*3/uL (ref 4.0–10.5)
nRBC: 0 % (ref 0.0–0.2)

## 2023-06-30 LAB — CMP (CANCER CENTER ONLY)
ALT: 16 U/L (ref 0–44)
AST: 19 U/L (ref 15–41)
Albumin: 4.3 g/dL (ref 3.5–5.0)
Alkaline Phosphatase: 132 U/L — ABNORMAL HIGH (ref 38–126)
Anion gap: 13 (ref 5–15)
BUN: 7 mg/dL (ref 6–20)
CO2: 25 mmol/L (ref 22–32)
Calcium: 10 mg/dL (ref 8.9–10.3)
Chloride: 101 mmol/L (ref 98–111)
Creatinine: 0.94 mg/dL (ref 0.44–1.00)
GFR, Estimated: >60 mL/min (ref >60)
Glucose, Bld: 351 mg/dL — ABNORMAL HIGH (ref 70–99)
Potassium: 4.3 mmol/L (ref 3.5–5.1)
Sodium: 140 mmol/L (ref 135–145)
Total Bilirubin: 0.5 mg/dL (ref 0.0–1.2)
Total Protein: 6.9 g/dL (ref 6.5–8.1)

## 2023-06-30 MED ORDER — SYNJARDY 12.5-1000 MG PO TABS: 1.0000 | ORAL_TABLET | Freq: Two times a day (BID) | ORAL | 3 refills | Status: DC

## 2023-06-30 MED ORDER — SODIUM CHLORIDE 0.9% FLUSH
10.0000 mL | Freq: Once | INTRAVENOUS | Status: AC
  Administered 2023-06-30: 10 mL

## 2023-06-30 MED ORDER — HEPARIN SOD (PORK) LOCK FLUSH 100 UNIT/ML IV SOLN
500.0000 [IU] | Freq: Once | INTRAVENOUS | Status: AC
  Administered 2023-06-30: 500 [IU]

## 2023-06-30 NOTE — Telephone Encounter (Signed)
 Patient has been scheduled for follow-up visit per 06/30/23 LOS.  Pt given an appt calendar with date and time.

## 2023-07-03 DIAGNOSIS — E11621 Type 2 diabetes mellitus with foot ulcer: Secondary | ICD-10-CM | POA: Diagnosis not present

## 2023-07-06 ENCOUNTER — Other Ambulatory Visit: Payer: Self-pay

## 2023-07-06 NOTE — Patient Outreach (Signed)
 Complex Care Management   Visit Note  07/06/2023  Name:  Linda [name] MRN: [id number] DOB: Dec 24, 1970  Situation: Referral received for Complex Care Management related to Diabetes with Complications and recent transmetatarsal amputation.  I obtained verbal consent from Patient.  Visit completed with patient  on the phone  Background:   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 2 diabetes mellitus, with fat layer exposed (HCC) 03/27/2020   Gastritis    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 No longer has    Assessment: Patient Reported Symptoms:  Cognitive Cognitive Status: Alert and oriented to person, place, and time      Neurological Neurological Review of Symptoms: Numbness, Dizziness (diabetic neuropathy bilateral hands and feet, dizziness sometimes when leaning head back and eyes are closed) Neurological Management Strategies:  Medication therapy, Coping strategies  HEENT HEENT Symptoms Reported: Change or loss of hearing, Mouth or teeth pain (patient still needs to schedule dental and audiology exam)      Cardiovascular Cardiovascular Symptoms Reported: Swelling in legs or feet (swelling in legs and feet stable) Cardiovascular Management Strategies: Medication therapy, Routine screening, Fluid modification Do You Have a Working Readable Scale?: No  Respiratory Respiratory Symptoms Reported: No symptoms reported    Endocrine Patient reports the following symptoms related to hypoglycemia or hyperglycemia : No symptoms reported Is patient diabetic?: Yes Is patient checking blood sugars at home?: Yes Endocrine Conditions: Diabetes Endocrine Management Strategies: Medication therapy, Routine screening, Medical device, Coping strategies  Gastrointestinal Gastrointestinal Symptoms Reported: No symptoms reported Gastrointestinal Management Strategies: Medication therapy Nutrition Risk Screen (CP): Large or nonhealing wound, burn or pressure injury  Genitourinary Genitourinary Symptoms Reported: No symptoms reported Genitourinary Conditions: Chronic kidney disease  Integumentary Integumentary Symptoms Reported: Wound, Incision Additional Integumentary Details: HH RN and podiatry monitoring metatarsal amputation healing, but patient had toe nail on right 4th toe fall off yesterday, bleeding lasting 30 minutes.  treating with betadine, bactroban  ointment and gauze dressing.  She will call podiatry and [facility] RN for further care. Skin Conditions: Pressure injury, Wound Skin Management Strategies: Dressing changes, Routine screening, Medication therapy  Musculoskeletal Musculoskelatal Symptoms Reviewed: Unsteady gait Additional Musculoskeletal Details: uses 4WW Musculoskeletal Conditions: Amputation, Mobility limited Musculoskeletal Management Strategies: Routine screening, Adequate rest Falls  in the past year?: No Patient at  Risk for Falls Due to: History of fall(s), Impaired balance/gait, Impaired mobility  Psychosocial Psychosocial Symptoms Reported: No symptoms reported            05/18/2023    1:32 PM  Depression screen PHQ 2/9  Decreased Interest 1  Down, Depressed, Hopeless 2  PHQ - 2 Score 3  Altered sleeping 2  Tired, decreased energy 2  Change in appetite 1  Feeling bad or failure about yourself  1  Trouble concentrating 1  Moving slowly or fidgety/restless 0  Suicidal thoughts 0  PHQ-9 Score 10  Difficult doing work/chores Somewhat difficult    There were no vitals filed for this visit.  Medications Reviewed Today     Reviewed by Clarnce Crow, RN (Registered Nurse) on 07/06/23 at 1021  Med List Status: <None>   Medication Order Taking? Sig Documenting Provider Last Dose Status Informant  acetaminophen  (TYLENOL ) 500 MG tablet [id number] Yes Take 500 mg by mouth every 6 (six) hours as needed for moderate pain or mild pain. [provider]  Active Self, Pharmacy Records  albuterol  (VENTOLIN  HFA) [phone] Base) MCG/ACT inhaler [id number] Yes INHALE TWO PUFFS BY MOUTH INTO LUNGS every SIX hours orn FOR WHEEZING AND/OR SHORTNESS OF Neena Baller, MD  Active Self, Pharmacy Records  amitriptyline  (ELAVIL ) 25 MG tablet [id number] Yes TAKE ONE TABLET BY MOUTH AT BEDTIME [name], Kirsten, MD  Active   atorvastatin  (LIPITOR) 10 MG tablet [id number] Yes TAKE ONE TABLET BY MOUTH EVERY Fleeta Hull, MD  Active Self, Pharmacy Records  Blood Glucose Monitoring Suppl [facility] VERIO REFLECT) w/Device Suzanne Erps [id number] Yes AS DIRECTED [provider]  Active Self, Pharmacy Records  Blood Pressure Monitoring [facility]) MISC [id number] Yes 1 each by Does not apply route daily in the afternoon. [name], Kirsten, MD  Active Self, Pharmacy Records  buPROPion  (WELLBUTRIN  XL) 300 MG 24 hr tablet [id number] Yes Take 1 tablet (300 mg total) by mouth every morning. Mercy Stall, MD  Active Self,  Pharmacy Records  busPIRone  (BUSPAR ) 5 MG tablet [id number] Yes TAKE ONE TABLET BY MOUTH 3 TIMES DAILY [name], Kirsten, MD  Active Self, Pharmacy Records  clotrimazole -betamethasone  (LOTRISONE ) cream [id number] Yes Apply topically 2 (two) times daily. [provider]  Active   Continuous Blood Gluc Receiver (FREESTYLE LIBRE 2 READER) DEVI [id number] Yes E11.69 Check blood sugar 4 times daily as directed [name], Burleigh Carp, MD  Active Self, Pharmacy Records  Continuous Glucose Sensor (FREESTYLE LIBRE 3 PLUS SENSOR) [place] [id number] Yes Change sensor every 15 days. [name], Kirsten, MD  Active   cyclobenzaprine  (FLEXERIL ) 10 MG tablet [id number] Yes TAKE ONE TABLET BY MOUTH EVERY 8 HOURS AS NEEDED FOR MUSCLE SPASMS [name], Kirsten, MD  Active Self, Pharmacy Records  dicyclomine  (BENTYL ) 20 MG tablet [id number] Yes TAKE ONE TABLET BY MOUTH BEFORE MEALS AND AT BEDTIME AS NEEDED FOR STOMACH CRAMPING [name], Kirsten, MD  Active   Empagliflozin-metFORMIN HCl (SYNJARDY ) 12.05-998 MG TABS [id number] Yes Take 1 tablet by mouth 2 (two) times daily. Mercy Stall, MD  Active   FETZIMA  80 MG CP24 [id number] Yes TAKE ONE CAPSULE BY MOUTH EVERY Fleeta Hull, MD  Active Self, Pharmacy Records  gabapentin  (NEURONTIN ) 400 MG capsule [id number] Yes TAKE 2 CAPSULES BY MOUTH 3 TIMES DAILY Sirivol, Mamatha, MD  Active   glucose blood test strip [id number] Yes Use as instructed to check FBS daily. E11.40 CoxBurleigh Carp, MD  Active Self, Pharmacy Records  Lancets [facility] DELICA PLUS [place])  MISC [id number] Yes USE TO check blood glucose 2-3 times daily AS Henderson Lock, MD  Active Self, Pharmacy Records  levothyroxine  (SYNTHROID ) 75 MCG tablet [id number] Yes Take 1 tablet (75 mcg total) by mouth daily before breakfast. Mercy Stall, MD  Active Self, Pharmacy Records  losartan  (COZAAR ) 50 MG tablet [id number] Yes TAKE ONE TABLET BY MOUTH EVERY Fleeta Hull, MD  Active   Magnesium  500 MG CAPS [id number] Yes Take 1 capsule (500  mg total) by mouth daily. [name], Kirsten, MD  Active Self, Pharmacy Records  metoprolol  succinate (TOPROL -XL) 25 MG 24 hr tablet [id number] Yes TAKE 1/2 TABLET BY MOUTH EVERY DAY [name], Kirsten, MD  Active   morphine  (MS CONTIN ) 30 MG 12 hr tablet [id number] Yes Take 1 tablet (30 mg total) by mouth every 12 (twelve) hours. Cyndi Drain, PA-C  Active   Multiple Vitamin (MULTIVITAMIN WITH MINERALS) TABS tablet [id number] Yes Take 1 tablet by mouth daily. Solar ray [provider]  Active Self, Pharmacy Records           Med Note [facility]   Tue Mar 01, 2022  4:02 PM)    mupirocin  ointment (BACTROBAN ) 2 % [id number] Yes Apply 1 Application topically 2 (two) times daily. Reina Cara, DPM  Active   nystatin  (MYCOSTATIN /NYSTOP ) powder [id number] Yes Apply 1 Application topically 3 (three) times daily. Janece Means, FNP  Active Self, Pharmacy Records           Med Note (STEFFENS, MICHELLE P   Thu May 18, 2023  2:01 PM) Patient is taking as needed  ondansetron  (ZOFRAN ) 4 MG tablet [id number] Yes Take 1 tablet (4 mg total) by mouth every 4 (four) hours as needed for nausea. [name], Kirsten, MD  Active Self, Pharmacy Records           Med Note [facility], [place] A   Wed Apr 19, 2023 10:01 AM) Patient states she has only used once in the past week - takes as needed  OVER THE COUNTER MEDICATION [id number] Yes Take 1 tablet by mouth in the morning and at bedtime. Lutein for eyes [provider]  Active Self, Pharmacy Records  pantoprazole  (PROTONIX ) 40 MG tablet [id number] Yes TAKE ONE TABLET BY MOUTH TWICE DAILY [name], Kirsten, MD  Active   polyethylene glycol (MIRALAX  / GLYCOLAX ) 17 g packet [id number] Yes Take 17 g by mouth daily as needed for moderate constipation. [provider]  Active Self, Pharmacy Records           Med Note [facility], [place] A   Wed Apr 19, 2023 10:02 AM) Patient reports moving bowels - takes as needed  potassium chloride  (MICRO-K ) 10 MEQ CR capsule [id number] Yes Take  2 capsules (20 mEq total) by mouth 2 (two) times daily. Mercy Stall, MD  Active Self, Pharmacy Records  Probiotic Product (PROBIOTIC PO) [id number] Yes Take 1 capsule by mouth in the morning. 3.2 billion cfu [provider]  Active Self, Pharmacy Records  prochlorperazine  (COMPAZINE ) 5 MG tablet [id number]  TAKE ONE TABLET BY MOUTH every SIX hours AS NEEDED FOR NAUSEA AND VOMITING  Patient not taking: Reported on 07/06/2023   Mercy Stall, MD  Active Self, Pharmacy Records           Med Note [facility], [place] A   Wed Apr 19, 2023 10:03 AM) Patient states she has not needed in a while  ramelteon  (ROZEREM ) 8 MG tablet [id number] Yes TAKE ONE TABLET BY MOUTH AT BEDTIME Mercy Stall, MD  Active Self, Pharmacy Records  rOPINIRole  (REQUIP ) 0.25 MG tablet [id number] Yes TAKE ONE TABLET BY MOUTH AT BEDTIME [name], Kirsten, MD  Active Self, Pharmacy Records  sodium chloride  flush (NS) 0.9 % injection 10 mL [id number]   Mosher, Kelli A, PA-C  Active   SURE COMFORT PEN NEEDLES 32G X 4 MM MISC [id number] Yes Use new needle with each injection. [name], Kirsten, MD  Active   tirzepatide  (MOUNJARO ) 10 MG/0.5ML Pen [id number] Yes Inject 10 mg into the skin once a week. Mercy Stall, MD  Active            Med Note Burley Carpenter, [facility] [place]   Thu Jun 22, 2023  1:29 PM) Patient still taking 7.5 mg, waiting for delivery of 10mg  not yet available at pharmacy  TRESIBA  FLEXTOUCH 200 UNIT/ML FlexTouch Pen [id number] Yes INJECT 60 UNITS SUBCUTANEOUSLY DAILY [name], Kirsten, MD  Active   Vitamin D , Ergocalciferol , (DRISDOL ) 1.25 MG (50000 UNIT) CAPS capsule [id number] Yes TAKE ONE CAPSULE BY MOUTH EVERY FRIDAY AT Isadora Mar, MD  Active Self, Pharmacy Records  VRAYLAR  3 MG capsule [id number] Yes TAKE ONE CAPSULE BY MOUTH EVERY DAY Cyndi Drain, PA-C  Active   VYVANSE  70 MG capsule [id number] Yes TAKE ONE CAPSULE BY MOUTH EVERY DAY Cyndi Drain, PA-C  Active             Recommendation:   Specialty provider follow-up patient will  contact podiatry to discuss loss of left 4th metatarsal nail removal. Continue Current Plan of Care  Follow Up Plan:   Telephone follow up appointment date/time:  07/19/23    Clarnce Crow BSN RN CCM [place]  [facility], [facility] RN Care Manager Direct Dial: [phone] Fax: [phone]

## 2023-07-06 NOTE — Patient Instructions (Signed)
 Visit Information  Thank you for taking time to visit with me today. Please don't hesitate to contact me if I can be of assistance to you before our next scheduled appointment.  Our next appointment is by telephone on 07/19/23 at 10:00 Please call the care guide team at [phone] if you need to cancel or reschedule your appointment.   Following is a copy of your care plan:   Goals Addressed             This Visit's Progress    VBCI RN Care Plan   Improving    Problems:  Chronic Disease Management support and education needs related to DMII and recent amputation (L third toe)  Goal: Over the next 30 days the Patient will attend all scheduled medical appointments: podiatry as evidenced by completed visit notes uploaded to EMR        demonstrate Ongoing adherence to prescribed treatment plan for DMII as evidenced by compliance with continuous glucose monitor, diet as advised by provider, and exercise take all medications exactly as prescribed and will call provider for medication related questions as evidenced by medication adherence    Continue to work with home health RN towards wound care/healing Patient amputation wound is healing well, but had a toenail on 4th left metatarsal fall off and caused bleeding for 30 minutes.  She will contact podiatry and [facility] RN for care needs.  Has dressed with betadine, bactroban  ointment and loose gauze dressing for now.  Interventions:   Diabetes Interventions: Assessed patient's understanding of A1c goal: <7% Reviewed medications with patient and discussed importance of medication adherence Discussed plans with patient for ongoing care management follow up and provided patient with direct contact information for care management team Provided patient with written educational materials related to hypo and hyperglycemia and importance of correct treatment Advised patient, providing education and rationale, to check cbg   and record, calling primary  provider for findings outside established parameters Discussed eating small, frequent meals to help with blood sugar management Lab Results  Component Value Date   HGBA1C 8.0 (H) 05/03/2023    Patient Self-Care Activities:  Attend all scheduled provider appointments Call provider office for new concerns or questions  Perform all self care activities independently  Take medications as prescribed   keep appointment with eye doctor check blood sugar at prescribed times: using continuous glucose monitor check feet daily for cuts, sores or redness Patient to schedule 6 month dental exam Patient completed annual eye exam 06/29/23  Plan:  Telephone follow up appointment with care management team member scheduled for:  07/19/23 at 10:00 AM             Please call the Suicide and Crisis Lifeline: 988 call the [place]  National Suicide Prevention Lifeline: 604-171-6759 or TTY: 423-755-9176 TTY 563-086-6490) to talk to a trained counselor call 1-800-273-TALK (toll free, 24 hour hotline) if you are experiencing a Mental Health or Behavioral Health Crisis or need someone to talk to.  Patient verbalizes understanding of instructions and care plan provided today and agrees to view in MyChart. Active MyChart status and patient understanding of how to access instructions and care plan via MyChart confirmed with patient.      Clarnce Crow BSN RN CCM [place]  [facility], [facility] RN Care Manager Direct Dial: [phone] Fax: [phone]

## 2023-07-13 ENCOUNTER — Other Ambulatory Visit: Payer: Self-pay | Admitting: Physician Assistant

## 2023-07-13 ENCOUNTER — Other Ambulatory Visit: Payer: Self-pay | Admitting: Family Medicine

## 2023-07-13 DIAGNOSIS — M86672 Other chronic osteomyelitis, left ankle and foot: Secondary | ICD-10-CM | POA: Diagnosis not present

## 2023-07-13 DIAGNOSIS — M62838 Other muscle spasm: Secondary | ICD-10-CM

## 2023-07-13 DIAGNOSIS — Z89432 Acquired absence of left foot: Secondary | ICD-10-CM | POA: Diagnosis not present

## 2023-07-13 DIAGNOSIS — E1140 Type 2 diabetes mellitus with diabetic neuropathy, unspecified: Secondary | ICD-10-CM

## 2023-07-13 DIAGNOSIS — E039 Hypothyroidism, unspecified: Secondary | ICD-10-CM

## 2023-07-13 DIAGNOSIS — R252 Cramp and spasm: Secondary | ICD-10-CM | POA: Diagnosis not present

## 2023-07-13 DIAGNOSIS — K7460 Unspecified cirrhosis of liver: Secondary | ICD-10-CM

## 2023-07-13 DIAGNOSIS — E118 Type 2 diabetes mellitus with unspecified complications: Secondary | ICD-10-CM | POA: Diagnosis not present

## 2023-07-13 DIAGNOSIS — B372 Candidiasis of skin and nail: Secondary | ICD-10-CM | POA: Diagnosis not present

## 2023-07-13 DIAGNOSIS — G2581 Restless legs syndrome: Secondary | ICD-10-CM

## 2023-07-13 DIAGNOSIS — F50819 Binge eating disorder, unspecified: Secondary | ICD-10-CM

## 2023-07-17 ENCOUNTER — Ambulatory Visit: Admitting: Podiatry

## 2023-07-17 DIAGNOSIS — M2041 Other hammer toe(s) (acquired), right foot: Secondary | ICD-10-CM | POA: Diagnosis not present

## 2023-07-17 DIAGNOSIS — Z89432 Acquired absence of left foot: Secondary | ICD-10-CM

## 2023-07-17 MED ORDER — OXYCODONE HCL 5 MG PO TABS
5.0000 mg | ORAL_TABLET | ORAL | 0 refills | Status: DC | PRN
Start: 2023-07-17 — End: 2023-09-01

## 2023-07-17 MED ORDER — SULFAMETHOXAZOLE-TRIMETHOPRIM 800-160 MG PO TABS: 1.0000 | ORAL_TABLET | Freq: Two times a day (BID) | ORAL | 0 refills | Status: AC

## 2023-07-17 MED ORDER — ACETAMINOPHEN 500 MG PO TABS: 500.0000 mg | ORAL_TABLET | Freq: Four times a day (QID) | ORAL | 1 refills | Status: AC | PRN

## 2023-07-17 NOTE — Progress Notes (Unsigned)
 Chief Complaint  Patient presents with  . Wound Check    Left foot looks a lot better. I can not see under the toes, but from what I could see it looks a little better. Light is in the room.  Last A1c 8 ish.   Date of surgery: 04/09/2023 Procedure: Left foot TMA with TAL   HPI: 53 y.o. female presents today for wound check of left foot TMA lateral incision ulcer.  She reports that the area has been slow to heal appears well-healed.  Denies any pain to the TMA site.  Denies any signs of infection.  She has had ongoing issues with the remaining hammertoes of the right 4th and 5th toe.  Past Medical History:  Diagnosis Date  . Acute postoperative respiratory insufficiency 04/17/2020  . AKI (acute kidney injury) (HCC) 04/17/2020  . Anemia   . Anxiety   . Arthritis   . Blood dyscrasia    ITP  . BRCA gene mutation positive   . Chronic pain syndrome   . Cirrhosis of liver not due to alcohol (HCC)   . Complication of anesthesia    Difficulty waking up  . Depression   . Diabetes mellitus without complication (HCC)    type 2  . Diabetic ulcer of toe of left foot associated with type 2 diabetes mellitus, with fat layer exposed (HCC) 03/27/2020  . Gastritis   . 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 GERD (gastroesophageal reflux disease)   . History of kidney stones   . Hypertension   . Hypothyroidism   . Malignant neoplasm of central portion of right female breast (HCC)   . Malignant neoplasm of lower-inner quadrant of right female breast (HCC)   . Malignant neoplasm of lower-inner quadrant of right female breast (HCC)   . Mixed hyperlipidemia   . 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 Other primary thrombocytopenia (HCC)   . Pneumonia   . Sleep apnea    hx of . No longer has    Past Surgical History:  Procedure Laterality Date  .  ACHILLES TENDON LENGTHENING Left 04/09/2023   Procedure: LENGTHENING, TENDON, ACHILLES;  Surgeon: Lamount Ethan CROME, DPM;  Location: [facility] OR;  Service: Podiatry;  Laterality: Left;  . AMPUTATION TOE Bilateral 03/04/2022   Procedure: AMPUTATION TOE RIGHT FOOT PARTIAL OR TOTAL GREAT TOE AND SECOND TOE, LEFT FOOT PARTIAL OR TOTAL GREAT TOE, SECOND AND THIRD TOE;  Surgeon: Malvin Marsa FALCON, DPM;  Location: [facility] OR;  Service: Podiatry;  Laterality: Bilateral;  . AMPUTATION TOE Right 01/04/2023   Procedure: R 1ST TOE AMPUTATION, RIGHT 3RD TOE AMPUTATION;  Surgeon: Malvin Marsa FALCON, DPM;  Location: [facility] OR[facility];  Service: Orthopedics/Podiatry;  Laterality: Right;  . APPENDECTOMY    . BACK SURGERY     x 3 lower disc  . BILATERAL TOTAL MASTECTOMY WITH AXILLARY LYMPH NODE DISSECTION Bilateral 09/2019  . CHOLECYSTECTOMY    . nephrolithiasis    . ROBOTIC ASSISTED TOTAL HYSTERECTOMY WITH BILATERAL SALPINGO OOPHERECTOMY Bilateral 06/14/2016   Procedure: ROBOTIC ASSISTED TOTAL HYSTERECTOMY WITH BILATERAL SALPINGO OOPHORECTOMY;  Surgeon: Dodie Shadow, MD;  Location: [facility];  Service: Gynecology;  Laterality: Bilateral;  . TRANSMETATARSAL AMPUTATION Left 04/09/2023   Procedure: LEFT TRANSMETATARSAL AMPUTATION;  Surgeon: Lamount Ethan CROME, DPM;  Location: [facility] OR;  Service: Podiatry;  Laterality: Left;  Allergies  Allergen Reactions  . Codeine Other (See Comments) and Shortness Of Breath    Other reaction(s): SHOB  . Amoxicillin -Pot Clavulanate Diarrhea  . Celecoxib Other (See Comments)    Unknown reaction  . Ezetimibe-Simvastatin Other (See Comments)    Unknown reaction  Patient is not aware of an allergy to this medication, ask  . Propranolol Other (See Comments)    Other reaction(s): Unknown  Other Reaction(s): dizziness, fatigue, hypotension, nightmares  . Propranolol Hcl Other (See Comments)    Unknown reaction  ask  . Simvastatin Other (See Comments)    Other reaction(s): Unknown  . Zetia  [Ezetimibe] Other (See Comments)    Other reaction(s): Unknown    ROS denies any nausea, vomiting, fever, chills, chest pain, shortness of breath   Physical Exam: There were no vitals filed for this visit.  General: The patient is alert and oriented x3 in no acute distress.  Dermatology: Left foot TMA incision well-healed.  No signs of dehiscence.  No open ulceration.  Prior ulceration resolved.  No erythema, no edema, no signs of acute infection.  Right 4th and 5th toes some hyperkeratotic buildup to the distal tufts, no maceration to the skin of the sulcus at this level.  Vascular: Palpable pedal pulses bilaterally. Capillary refill within normal limits.  No appreciable edema.  No diffuse erythema or calor.  Neurological: Light touch sensation grossly intact bilateral feet.  Protective sensation decreased  Musculoskeletal Exam: Status post left foot TMA with TAL.  Right foot remaining 4th and 5th toes semireducible hammertoe contracture  Radiographic Exam: Left foot 3 views weightbearing 06/26/2023 Surgical changes status post left foot transmetatarsal amputation noted. Stable appearing in AP and Lateral view. Distal metatarsals are well-visualized without obvious signs of lucency, cortical erosion or periosteal reaction. Amorphous cloudy appearance in the soft tissues seen in prior radiographs consolidating resembling heterotopic ossification noted.   Three Phase Bone Scan [facility] May of 2025 Reviewed outside imaging from prior order.  There was some increased uptake in all 3 phases.  This could favor osteomyelitis or developing heterotopic ossification as well.  Assessment/Plan of Care: 1. Hammertoe of right foot   2. History of transmetatarsal amputation of left foot (HCC)      Meds ordered this encounter  Medications  . oxyCODONE  (OXY IR/ROXICODONE ) 5 MG immediate release tablet    Sig: Take 1 tablet (5 mg total) by mouth every 4 (four) hours as needed for up to 20  doses for severe pain (pain score 7-10).    Dispense:  20 tablet    Refill:  0  . acetaminophen  (TYLENOL ) 500 MG tablet    Sig: Take 1 tablet (500 mg total) by mouth every 6 (six) hours as needed for up to 15 days for moderate pain (pain score 4-6) or mild pain (pain score 1-3).    Dispense:  30 tablet    Refill:  1  . sulfamethoxazole -trimethoprim  (BACTRIM  DS) 800-160 MG tablet    Sig: Take 1 tablet by mouth 2 (two) times daily for 5 days.    Dispense:  10 tablet    Refill:  0   None  Discussed clinical findings with patient today.  # Left foot TMA with lateral dehiscence - Area of dehiscence healing well with scab debrided, underlying area superficial at this point. -Advised the patient apply mupirocin  to this area daily, dressed with bandage and apply offloading padding.  Bactroban  and horseshoe pad applied today. - No clinical signs of acute left foot  infection.  Some mild focal warmth increased but swelling well-controlled and no erythema, no pain. - Reviewed radiographs and three-phase bone scan results with patient.  Will obtain new CBC, sed rate and CRP now that patient has been off antibiotics for some time now.  If inflammatory markers remain elevated, will consider ID referral, otherwise favoring heterotopic ossification seen on radiographs  # Right 4th and 5th hammertoes - Possible developing tinea infection to sulcus with some maceration present - Cleaned these areas with Betadine - Instructed patient to clean with Betadine and apply a topical antifungal medication such as Tinactin powder daily over the next couple weeks.  Contracture likely trapping moisture within the sulcus. - Will plan for flexor tenotomy of these toes if stable at follow-up. -I certify that this diagnosis represents a distinct and separate diagnosis that requires evaluation and treatment separate from other procedures or diagnosis, and is unrelated to patient's transmetatarsal amputation.   Follow-up  in 3 weeks   Aiyla Baucom L. Lamount MAUL, AACFAS [facility]     [address] [place], [place] [address]                Office [phone]  Fax [phone]     TMA site looks great  Plan for right 4th and 5th toe flexor tenotomy  Likely will try for DM shoes at follow-up

## 2023-07-19 ENCOUNTER — Encounter: Payer: Self-pay | Admitting: Podiatry

## 2023-07-19 DIAGNOSIS — E11621 Type 2 diabetes mellitus with foot ulcer: Secondary | ICD-10-CM | POA: Diagnosis not present

## 2023-07-20 ENCOUNTER — Other Ambulatory Visit: Payer: Self-pay

## 2023-07-20 NOTE — Patient Outreach (Signed)
 Complex Care Management   Visit Note  07/20/2023  Name:  Linda [name] MRN: [id number] DOB: 1971/01/14  Situation: Referral received for Complex Care Management related to Diabetes with Complications I obtained verbal consent from Patient.  Visit completed with patient  on the phone  Background:   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 2 diabetes mellitus, with fat layer exposed (HCC) 03/27/2020   Gastritis    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 No longer has    Assessment: Patient Reported Symptoms:  Cognitive Cognitive Status: Alert and oriented to person, place, and time      Neurological Neurological Review of Symptoms: Dizziness, Numbness (diabetic neuropathy bilateral feet) Neurological Management Strategies: Routine screening, Medication therapy, Diet modification, Adequate rest, Coping strategies Neurological  Self-Management Outcome: 3 (uncertain)  HEENT HEENT Symptoms Reported: Mouth or teeth pain (actively seeking new dental care) Vision Problems: blindness/vision loss    Cardiovascular Cardiovascular Symptoms Reported:  (patient reports swelling in legs and feet have improved) Does patient have uncontrolled Hypertension?: No Cardiovascular Conditions: Hypertension Cardiovascular Management Strategies: Medication therapy, Routine screening, Weight management, Diet modification  Respiratory Respiratory Symptoms Reported: No symptoms reported    Endocrine Patient reports the following symptoms related to hypoglycemia or hyperglycemia : No symptoms reported Is patient diabetic?: Yes Is patient checking blood sugars at home?: Yes Endocrine Conditions: Diabetes Endocrine Management Strategies: Medication therapy, Routine screening, Diet modification  Gastrointestinal Gastrointestinal Symptoms Reported: No symptoms reported      Genitourinary Genitourinary Symptoms Reported: No symptoms reported    Integumentary Additional Integumentary Details: per podiatry notes, amputation surgical wound has healed.  now has new surgical site from tenotomy right foot Skin Management Strategies: Dressing changes, Medication therapy, Medical device, Routine screening Skin Comment: return post op visit 07/25/23  Musculoskeletal Musculoskelatal Symptoms Reviewed: Unsteady gait, Other Other Musculoskeletal Symptoms: right knee pain recently will start wearing knee brace, will ortho referral during PCP visit 09/01/23 Musculoskeletal Conditions: Joint pain Musculoskeletal Management Strategies: Medication therapy, Routine screening Falls in the past year?: No    Psychosocial Psychosocial Symptoms Reported: No symptoms reported     Quality of Family Relationships: helpful, involved, supportive      05/18/2023    1:32 PM  Depression screen [facility] 2/9  Decreased Interest 1  Down, Depressed, Hopeless 2  PHQ - 2 Score  3  Altered sleeping 2  Tired, decreased energy 2  Change in appetite 1  Feeling bad or failure about yourself  1  Trouble concentrating 1  Moving slowly or fidgety/restless 0  Suicidal thoughts 0  PHQ-9 Score 10  Difficult doing work/chores Somewhat difficult    There were no vitals filed for this visit.  Medications Reviewed Today     Reviewed by Lonzell Planas, RN (Registered Nurse) on 07/20/23 at 1021  Med List Status: <None>   Medication Order Taking? Sig Documenting Provider Last Dose Status Informant  acetaminophen  (TYLENOL ) 500 MG tablet [id number] Yes Take 1 tablet (500 mg total) by mouth every 6 (six) hours as needed for up to 15 days for moderate pain (pain score 4-6) or mild pain (pain score 1-3). Lamount Ethan CROME, DPM  Active   albuterol  (VENTOLIN  HFA) 108 (90 Base) MCG/ACT inhaler [id number]  INHALE TWO PUFFS BY MOUTH INTO LUNGS every SIX hours orn FOR WHEEZING AND/OR SHORTNESS OF SHERIDA Sherre Clapper, MD  Active Self, Pharmacy Records  amitriptyline  (ELAVIL ) 25 MG tablet [id number]  TAKE ONE TABLET BY MOUTH AT BEDTIME [name], Kirsten, MD  Active   atorvastatin  (LIPITOR) 10 MG tablet [id number]  TAKE ONE TABLET BY MOUTH EVERY KARNA Sherre Clapper, MD  Active   Blood Glucose Monitoring Suppl [facility] VERIO REFLECT) w/Device PRESSLEY [id number]  AS DIRECTED [provider]  Active Self, Pharmacy Records  Blood Pressure Monitoring [facility]) MISC [id number]  1 each by Does not apply route daily in the afternoon. [name], Kirsten, MD  Active Self, Pharmacy Records  buPROPion  (WELLBUTRIN  XL) 300 MG 24 hr tablet [id number]  TAKE ONE TABLET BY MOUTH EVERY DAY [name], Kirsten, MD  Active   busPIRone  (BUSPAR ) 5 MG tablet [id number]  TAKE ONE TABLET BY MOUTH 3 TIMES DAILY [name], Kirsten, MD  Active Self, Pharmacy Records  clotrimazole -betamethasone  (LOTRISONE ) cream [id number]  Apply topically 2 (two) times daily. [provider]  Active   Continuous Blood Gluc Receiver (FREESTYLE LIBRE 2  READER) DEVI [id number]  E11.69 Check blood sugar 4 times daily as directed [name], Clapper, MD  Active Self, Pharmacy Records  Continuous Glucose Sensor (FREESTYLE LIBRE 3 PLUS SENSOR) [place] [id number]  Change sensor every 15 days. [name], Kirsten, MD  Active   cyclobenzaprine  (FLEXERIL ) 10 MG tablet [id number]  TAKE ONE TABLET BY MOUTH EVERY 8 HOURS AS NEEDED FOR MUSCLE SPASMS [name], Kirsten, MD  Active   dicyclomine  (BENTYL ) 20 MG tablet [id number]  TAKE ONE TABLET BY MOUTH BEFORE MEALS AND AT BEDTIME AS NEEDED FOR STOMACH CRAMPING [name], Kirsten, MD  Active   Empagliflozin-metFORMIN HCl (SYNJARDY ) 12.05-998 MG TABS [id number]  Take 1 tablet by mouth 2 (two) times daily. Sherre Clapper, MD  Active   FETZIMA  80 MG CP24 [id number]  TAKE ONE CAPSULE BY MOUTH EVERY KARNA Sherre Clapper, MD  Active Self, Pharmacy Records  gabapentin  (NEURONTIN ) 400 MG capsule [id number]  TAKE 2 CAPSULES BY MOUTH 3 TIMES DAILY Sirivol, Mamatha, MD  Active   glucose blood test strip [id number]  Use as instructed to check FBS daily. E11.40 Sherre Clapper, MD  Active Self, Pharmacy Records  Lancets [facility] CATHRYNE PLUS [place]) [place] [id number]  USE TO check blood glucose 2-3 times daily AS JONELLE Sherre Clapper, MD  Active Self, Pharmacy Records  levothyroxine  (SYNTHROID ) 75 MCG tablet [id number]  TAKE ONE TABLET BY MOUTH EVERY DAY [name], Kirsten, MD  Active   losartan  (COZAAR ) 50  MG tablet [id number]  TAKE ONE TABLET BY MOUTH EVERY KARNA Sherre Clapper, MD  Active   Magnesium  500 MG CAPS [id number]  Take 1 capsule (500 mg total) by mouth daily. [name], Kirsten, MD  Active Self, Pharmacy Records  metoprolol  succinate (TOPROL -XL) 25 MG 24 hr tablet [id number]  TAKE 1/2 TABLET BY MOUTH EVERY DAY [name], Kirsten, MD  Active   morphine  (MS CONTIN ) 30 MG 12 hr tablet [id number]  Take 1 tablet (30 mg total) by mouth every 12 (twelve) hours. [name], Kirsten, MD  Active   Multiple Vitamin (MULTIVITAMIN WITH MINERALS) TABS tablet [id number]  Take 1 tablet by mouth daily.  Solar ray [provider]  Active Self, Pharmacy Records           Med Note CHRISTIE ALEXANDER   Tue Mar 01, 2022  4:02 PM)    mupirocin  ointment (BACTROBAN ) 2 % [id number]  Apply 1 Application topically 2 (two) times daily. Lamount Ethan CROME, DPM  Active   nystatin  (MYCOSTATIN /NYSTOP ) powder [id number]  Apply 1 Application topically 3 (three) times daily. Teressa Harrie HERO, FNP  Active Self, Pharmacy Records           Med Note (STEFFENS, MICHELLE P   Thu May 18, 2023  2:01 PM) Patient is taking as needed  ondansetron  (ZOFRAN ) 4 MG tablet [id number]  Take 1 tablet (4 mg total) by mouth every 4 (four) hours as needed for nausea. [name], Kirsten, MD  Active Self, Pharmacy Records           Med Note [facility] [place] A   Wed Apr 19, 2023 10:01 AM) Patient states she has only used once in the past week - takes as needed  OVER THE COUNTER MEDICATION [id number]  Take 1 tablet by mouth in the morning and at bedtime. Lutein for eyes [provider]  Active Self, Pharmacy Records  oxyCODONE  (OXY IR/ROXICODONE ) 5 MG immediate release tablet [id number] Yes Take 1 tablet (5 mg total) by mouth every 4 (four) hours as needed for up to 20 doses for severe pain (pain score 7-10). Lamount Ethan CROME, DPM  Active   pantoprazole  (PROTONIX ) 40 MG tablet [id number]  TAKE ONE TABLET BY MOUTH TWICE DAILY [name], Kirsten, MD  Active   polyethylene glycol (MIRALAX  / GLYCOLAX ) 17 g packet [id number]  Take 17 g by mouth daily as needed for moderate constipation. [provider]  Active Self, Pharmacy Records           Med Note [facility] [place], RHONDA A   Wed Apr 19, 2023 10:02 AM) Patient reports moving bowels - takes as needed  potassium chloride  (MICRO-K ) 10 MEQ CR capsule [id number]  TAKE 2 CAPSULES BY MOUTH EVERY DAY and TAKE 2 CAPSULES BY MOUTH EVERY KARNA Sherre, Kirsten, MD  Active   Probiotic Product (PROBIOTIC PO) [id number]  Take 1 capsule by mouth in the morning. 3.2 billion cfu [provider]  Active Self,  Pharmacy Records  prochlorperazine  (COMPAZINE ) 5 MG tablet [id number]  TAKE ONE TABLET BY MOUTH every SIX hours AS NEEDED FOR NAUSEA AND VOMITING  Patient not taking: Reported on 07/17/2023   Sherre Clapper, MD  Active Self, Pharmacy Records           Med Note [facility], [facility] A   Wed Apr 19, 2023 10:03 AM) Patient states she has not needed in a while  ramelteon  (ROZEREM ) 8 MG tablet [id number]  TAKE ONE TABLET BY MOUTH AT BEDTIME CoxClapper, MD  Active Self, Pharmacy Records  rOPINIRole  (REQUIP )  0.25 MG tablet [id number]  TAKE ONE TABLET BY MOUTH AT BEDTIME [name], Kirsten, MD  Active   sodium chloride  flush (NS) 0.9 % injection 10 mL [id number]   Mosher, Kelli A, PA-C  Active   sulfamethoxazole -trimethoprim  (BACTRIM  DS) 800-160 MG tablet [id number] Yes Take 1 tablet by mouth 2 (two) times daily for 5 days. Lamount Ethan CROME, DPM  Active   SURE COMFORT PEN NEEDLES 32G X 4 MM MISC [id number]  Use new needle with each injection. [name], Kirsten, MD  Active   tirzepatide  (MOUNJARO ) 10 MG/0.5ML Pen [id number]  Inject 10 mg into the skin once a week. Sherre Clapper, MD  Active            Med Note DANICE, Enas Winchel   Thu Jun 22, 2023  1:29 PM) Patient still taking 7.5 mg, waiting for delivery of 10mg  not yet available at pharmacy  TRESIBA  FLEXTOUCH 200 UNIT/ML FlexTouch Pen [id number]  INJECT 60 UNITS SUBCUTANEOUSLY DAILY [name], Kirsten, MD  Active   Vitamin D , Ergocalciferol , (DRISDOL ) 1.25 MG (50000 UNIT) CAPS capsule [id number]  TAKE ONE CAPSULE BY MOUTH EVERY FRIDAY AT [place] Sherre Clapper, MD  Active   VRAYLAR  3 MG capsule [id number]  TAKE ONE CAPSULE BY MOUTH EVERY DAY Nicholaus Credit, PA-C  Active   VYVANSE  70 MG capsule [id number]  TAKE ONE CAPSULE BY MOUTH EVERY DAY [name], Kirsten, MD  Active             Recommendation:   Continue Current Plan of Care  Follow Up Plan:   Telephone follow up appointment date/time:  08/04/23 at 10:00   Olam Idol BSN RN CCM [place]  [facility], [facility]  Health RN Care Manager Direct Dial: [phone] Fax: [phone]

## 2023-07-20 NOTE — Patient Instructions (Signed)
 Visit Information  Thank you for taking time to visit with me today. Please don't hesitate to contact me if I can be of assistance to you before our next scheduled appointment.  Our next appointment is by telephone on 08/04/23 at 10:00 Please call the care guide team at [phone] if you need to cancel or reschedule your appointment.   Following is a copy of your care plan:   Goals Addressed             This Visit's Progress    VBCI RN Care Plan   On track    Problems:  Chronic Disease Management support and education needs related to DMII and recent amputation (L third toe)  Goal: Over the next 30 days the Patient will attend all scheduled medical appointments: 07/25/23 podiatry, 09/01/23 PCP as evidenced by completed visit notes uploaded to EMR        demonstrate Ongoing adherence to prescribed treatment plan for DMII as evidenced by compliance with continuous glucose monitor, diet as advised by provider, and exercise take all medications exactly as prescribed and will call provider for medication related questions as evidenced by medication adherence    Continue to work with home health RN towards wound care/healing As per podiatry note, amputation surgical wound has fully healed.  Patient had tenotomy to right 4 and 5th metatarsal, dressed and post op visit scheduled for 07/25/23  patient denies s/sx of infection.  Wearing surgical shoe, using [id number]. Interventions:   Diabetes Interventions: Assessed patient's understanding of A1c goal: <7% Reviewed medications with patient and discussed importance of medication adherence Discussed plans with patient for ongoing care management follow up and provided patient with direct contact information for care management team Provided patient with written educational materials related to hypo and hyperglycemia and importance of correct treatment Advised patient, providing education and rationale, to check cbg once daily fasting and record, calling  primary provider for findings outside established parameters Discussed eating small, frequent meals to help with blood sugar management Lab Results  Component Value Date   HGBA1C 8.0 (H) 05/03/2023    Patient Self-Care Activities:  Attend all scheduled provider appointments Call provider office for new concerns or questions  Perform all self care activities independently  Take medications as prescribed   keep appointment with eye doctor check blood sugar at prescribed times: using continuous glucose monitor check feet daily for cuts, sores or redness Patient to schedule 6 month dental exam Patient completed annual eye exam 06/29/23  Plan:  Telephone follow up appointment with care management team member scheduled for:  08/04/23 at 10:00 AM             Please call the Suicide and Crisis Lifeline: 988 call the [place]  National Suicide Prevention Lifeline: 934-887-5498 or TTY: 5186343638 TTY 507-135-8939) to talk to a trained counselor call 1-800-273-TALK (toll free, 24 hour hotline) if you are experiencing a Mental Health or Behavioral Health Crisis or need someone to talk to.  Patient verbalizes understanding of instructions and care plan provided today and agrees to view in MyChart. Active MyChart status and patient understanding of how to access instructions and care plan via MyChart confirmed with patient.      Olam Idol BSN RN CCM [place]  [facility], [facility] RN Care Manager Direct Dial: [phone] Fax: [phone]

## 2023-07-25 ENCOUNTER — Ambulatory Visit (INDEPENDENT_AMBULATORY_CARE_PROVIDER_SITE_OTHER): Admitting: Podiatry

## 2023-07-25 DIAGNOSIS — Z89421 Acquired absence of other right toe(s): Secondary | ICD-10-CM

## 2023-07-25 DIAGNOSIS — Z89411 Acquired absence of right great toe: Secondary | ICD-10-CM

## 2023-07-25 DIAGNOSIS — Z89432 Acquired absence of left foot: Secondary | ICD-10-CM

## 2023-07-25 DIAGNOSIS — E1142 Type 2 diabetes mellitus with diabetic polyneuropathy: Secondary | ICD-10-CM

## 2023-07-25 NOTE — Progress Notes (Signed)
 Chief Complaint  Patient presents with   Tenotomy Follow up    Right tenotomy follow up. Looks great. No pain.   DOS: 07/17/2023 Procedure: Right 4th and 5th toe flexor tenotomy  HPI: 53 y.o. female presents today following up for above-stated procedures.  She reports no pain, no signs of infection, no complications.  Presents utilizing surgical shoe.  She would like to discuss diabetic shoes today due to her history of transmetatarsal amputation left foot and multiple digital amputations right foot.  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 No longer has    Past Surgical History:  Procedure Laterality Date   ACHILLES TENDON LENGTHENING Left 04/09/2023   Procedure: LENGTHENING, TENDON, ACHILLES;  Surgeon: Lamount Ethan CROME,  DPM;  Location: [facility] OR;  Service: Podiatry;  Laterality: Left;   AMPUTATION TOE Bilateral 03/04/2022   Procedure: AMPUTATION TOE RIGHT FOOT PARTIAL OR TOTAL GREAT TOE AND SECOND TOE, LEFT FOOT PARTIAL OR TOTAL GREAT TOE, SECOND AND THIRD TOE;  Surgeon: Malvin Marsa FALCON, DPM;  Location: [facility] OR;  Service: Podiatry;  Laterality: Bilateral;   AMPUTATION TOE Right 01/04/2023   Procedure: R 1ST TOE AMPUTATION, RIGHT 3RD TOE AMPUTATION;  Surgeon: Malvin Marsa FALCON, DPM;  Location: [facility];  Service: Orthopedics/Podiatry;  Laterality: Right;   APPENDECTOMY     BACK SURGERY     x 3 lower disc   BILATERAL TOTAL MASTECTOMY WITH AXILLARY LYMPH NODE DISSECTION Bilateral 09/2019   CHOLECYSTECTOMY     nephrolithiasis     ROBOTIC ASSISTED TOTAL HYSTERECTOMY WITH BILATERAL SALPINGO OOPHERECTOMY Bilateral 06/14/2016   Procedure: ROBOTIC ASSISTED TOTAL HYSTERECTOMY WITH BILATERAL SALPINGO OOPHORECTOMY;  Surgeon: Dodie Shadow, MD;  Location: [facility];  Service: Gynecology;  Laterality: Bilateral;   TRANSMETATARSAL AMPUTATION Left 04/09/2023   Procedure: LEFT TRANSMETATARSAL AMPUTATION;  Surgeon: Lamount Ethan CROME, DPM;  Location: [facility] OR;  Service: Podiatry;  Laterality: Left;    Allergies  Allergen Reactions   Codeine Other (See Comments) and Shortness Of Breath    Other reaction(s): SHOB   Amoxicillin -Pot Clavulanate Diarrhea   Celecoxib Other (See Comments)  Unknown reaction   Ezetimibe-Simvastatin Other (See Comments)    Unknown reaction  Patient is not aware of an allergy to this medication, ask   Propranolol Other (See Comments)    Other reaction(s): Unknown  Other Reaction(s): dizziness, fatigue, hypotension, nightmares   Propranolol Hcl Other (See Comments)    Unknown reaction  ask   Simvastatin Other (See Comments)    Other reaction(s): Unknown   Zetia [Ezetimibe] Other (See Comments)    Other reaction(s): Unknown    ROS    Physical Exam: There were no vitals filed for this  visit.  General: The patient is alert and oriented x3 in no acute distress.  Dermatology: Skin is warm, dry and supple bilateral lower extremities.  Right fourth toe flexor [place] does have scab, fifth toe appears well-healed.  No wounds or signs skin breakdown to the left foot transmetatarsal amputation site.  Vascular: Palpable pedal pulses bilaterally. Capillary refill within normal limits.  Decreased pedal hair present.  No erythema noted.  Neurological: Light touch sensation grossly intact bilateral feet.  Protective sensation decreased.  Musculoskeletal Exam: History of left transmetatarsal imitation noted.  Prior amputations of right first, second, third toe.  Improved rectus position of the right fourth toe and somewhat of the fifth toe following flexor tenotomy.  There is still some residual fifth toe contracture.  Assessment/Plan of Care: 1. DM type 2 with diabetic peripheral neuropathy (HCC)   2. History of transmetatarsal amputation of left foot (HCC)   3. History of amputation of lesser toe of right foot (HCC)   4. History of partial ray amputation of first toe of right foot (HCC)      No orders of the defined types were placed in this encounter.  FOR HOME USE ONLY DME DIABETIC SHOE  Discussed clinical findings with patient today.  Flexor tenotomy site progressing well.  Did apply antibiotic ointment and bandage to the fourth toe where there is a large scab present.  She can continue to do this for 1 week daily.  Okay to return to regular shoe gear.  No other precautions.  Okay to get the site wet in the shower.  # History of left foot transmetatarsal amputation # History of first, second, third toe amputations right foot # Diabetes with neuropathy -Prescription sent in for diabetic shoes with 3 sets of multidensity inserts and bilateral toe filler devices.  Referral sent to [facility] clinic.  Due to diabetes neuropathy, history of left foot transmetatarsal amputation,  history of right first, second, third toe amputations. -Medically necessary to limit risk of further ulceration and will assist with balance and gait as patient gets back to normal weightbearing activity -This E/M and prescription for the diabetic shoes is unrelated to the patient's flexor tenotomy procedures  Patient can follow-up in 3 months for diabetic foot exam, or sooner if new pedal complaints arise   Cynithia Hakimi L. Lamount MAUL, AACFAS [facility]     [address] [place], [place] [address]                Office [phone]  Fax [phone]

## 2023-07-27 ENCOUNTER — Other Ambulatory Visit: Payer: Self-pay | Admitting: Family Medicine

## 2023-07-27 DIAGNOSIS — E1140 Type 2 diabetes mellitus with diabetic neuropathy, unspecified: Secondary | ICD-10-CM

## 2023-07-29 ENCOUNTER — Other Ambulatory Visit: Payer: Self-pay | Admitting: Family Medicine

## 2023-07-29 DIAGNOSIS — Z794 Long term (current) use of insulin: Secondary | ICD-10-CM

## 2023-07-30 ENCOUNTER — Other Ambulatory Visit: Payer: Self-pay | Admitting: Family Medicine

## 2023-07-30 DIAGNOSIS — E1140 Type 2 diabetes mellitus with diabetic neuropathy, unspecified: Secondary | ICD-10-CM

## 2023-08-03 ENCOUNTER — Ambulatory Visit: Admitting: Family Medicine

## 2023-08-04 ENCOUNTER — Telehealth: Payer: Self-pay

## 2023-08-09 ENCOUNTER — Telehealth: Payer: Self-pay

## 2023-08-09 NOTE — Telephone Encounter (Signed)
 Copied from CRM [id number]. Topic: General - Call Back - No Documentation >> Aug 09, 2023 11:00 AM Gustabo D wrote: Patient calling Olam Idol for a appointment to be setup

## 2023-08-09 NOTE — Progress Notes (Signed)
 Complex Care Management Care Guide Note  08/09/2023 Name: Sadaf Przybysz MRN: [id number] DOB: [birth year]  Danira Nylander is a 53 y.o. year old female who is a primary care patient of [name], Kirsten, MD and is actively engaged with the care management team. I reached out to Therisa Almarie Gash by phone today to assist with re-scheduling  with the RN Case Manager.  Follow up plan: Unsuccessful telephone outreach attempt made. A HIPAA compliant phone message was left for the patient providing contact information and requesting a return call.  Dreama [facility]wood [facility]  [facility], [facility] Health Care Management Assistant Direct Dial: [phone]  Fax: [phone]

## 2023-08-10 ENCOUNTER — Telehealth: Payer: Self-pay

## 2023-08-10 NOTE — Telephone Encounter (Signed)
 Copied from CRM [id number]. Topic: General - Other >> Aug 10, 2023  9:10 AM Turkey B wrote: Reason for CRM: Comer from [employer] and prosthetics, called in says needs most recent office notes for pt for ortho reasons. Fx is [phone]

## 2023-08-11 ENCOUNTER — Other Ambulatory Visit: Payer: Self-pay | Admitting: Family Medicine

## 2023-08-11 DIAGNOSIS — F50819 Binge eating disorder, unspecified: Secondary | ICD-10-CM

## 2023-08-11 DIAGNOSIS — Z794 Long term (current) use of insulin: Secondary | ICD-10-CM

## 2023-08-11 DIAGNOSIS — K7460 Unspecified cirrhosis of liver: Secondary | ICD-10-CM

## 2023-08-12 DIAGNOSIS — M86672 Other chronic osteomyelitis, left ankle and foot: Secondary | ICD-10-CM | POA: Diagnosis not present

## 2023-08-12 DIAGNOSIS — Z89432 Acquired absence of left foot: Secondary | ICD-10-CM | POA: Diagnosis not present

## 2023-08-12 DIAGNOSIS — R252 Cramp and spasm: Secondary | ICD-10-CM | POA: Diagnosis not present

## 2023-08-12 DIAGNOSIS — B372 Candidiasis of skin and nail: Secondary | ICD-10-CM | POA: Diagnosis not present

## 2023-08-12 DIAGNOSIS — E118 Type 2 diabetes mellitus with unspecified complications: Secondary | ICD-10-CM | POA: Diagnosis not present

## 2023-08-15 NOTE — Progress Notes (Signed)
 Complex Care Management Care Guide Note  08/15/2023 Name: Yittel Emrich MRN: [id number] DOB: Mar 16, 1970  Alisse Tuite is a 53 y.o. year old female who is a primary care patient of [name], Kirsten, MD and is actively engaged with the care management team. I reached out to Therisa Almarie Gash by phone today to assist with re-scheduling  with the RN Case Manager.  Follow up plan: Patient declined to reschedule at this time and follow up with PCP.  Dreama Lynwood [facility]  [facility], [facility] Health Care Management Assistant Direct Dial: [phone]  Fax: [phone]

## 2023-08-26 ENCOUNTER — Other Ambulatory Visit: Payer: Self-pay | Admitting: Family Medicine

## 2023-08-26 ENCOUNTER — Other Ambulatory Visit: Payer: Self-pay

## 2023-08-26 DIAGNOSIS — E1140 Type 2 diabetes mellitus with diabetic neuropathy, unspecified: Secondary | ICD-10-CM

## 2023-08-29 ENCOUNTER — Other Ambulatory Visit: Payer: Self-pay | Admitting: Family Medicine

## 2023-08-29 DIAGNOSIS — E1140 Type 2 diabetes mellitus with diabetic neuropathy, unspecified: Secondary | ICD-10-CM

## 2023-09-01 ENCOUNTER — Ambulatory Visit (INDEPENDENT_AMBULATORY_CARE_PROVIDER_SITE_OTHER): Admitting: Family Medicine

## 2023-09-01 ENCOUNTER — Encounter: Payer: Self-pay | Admitting: Family Medicine

## 2023-09-01 VITALS — BP 110/60 | HR 106 | Temp 97.3°F | Resp 16 | Ht 62.0 in | Wt 207.0 lb

## 2023-09-01 DIAGNOSIS — F1120 Opioid dependence, uncomplicated: Secondary | ICD-10-CM

## 2023-09-01 DIAGNOSIS — E1121 Type 2 diabetes mellitus with diabetic nephropathy: Secondary | ICD-10-CM | POA: Diagnosis not present

## 2023-09-01 DIAGNOSIS — E039 Hypothyroidism, unspecified: Secondary | ICD-10-CM

## 2023-09-01 DIAGNOSIS — K7460 Unspecified cirrhosis of liver: Secondary | ICD-10-CM

## 2023-09-01 DIAGNOSIS — E782 Mixed hyperlipidemia: Secondary | ICD-10-CM

## 2023-09-01 DIAGNOSIS — F411 Generalized anxiety disorder: Secondary | ICD-10-CM

## 2023-09-01 DIAGNOSIS — G894 Chronic pain syndrome: Secondary | ICD-10-CM | POA: Diagnosis not present

## 2023-09-01 DIAGNOSIS — K7581 Nonalcoholic steatohepatitis (NASH): Secondary | ICD-10-CM | POA: Diagnosis not present

## 2023-09-01 DIAGNOSIS — E1140 Type 2 diabetes mellitus with diabetic neuropathy, unspecified: Secondary | ICD-10-CM | POA: Diagnosis not present

## 2023-09-01 DIAGNOSIS — Z794 Long term (current) use of insulin: Secondary | ICD-10-CM

## 2023-09-01 DIAGNOSIS — E66812 Obesity, class 2: Secondary | ICD-10-CM

## 2023-09-01 DIAGNOSIS — I10 Essential (primary) hypertension: Secondary | ICD-10-CM | POA: Diagnosis not present

## 2023-09-01 DIAGNOSIS — M5416 Radiculopathy, lumbar region: Secondary | ICD-10-CM | POA: Diagnosis not present

## 2023-09-01 MED ORDER — GABAPENTIN 800 MG PO TABS: 800.0000 mg | ORAL_TABLET | Freq: Three times a day (TID) | ORAL | 1 refills | Status: AC

## 2023-09-01 MED ORDER — TIRZEPATIDE 12.5 MG/0.5ML ~~LOC~~ SOAJ: 12.5000 mg | SUBCUTANEOUS | 0 refills | Status: DC

## 2023-09-01 MED ORDER — BUSPIRONE HCL 10 MG PO TABS: 10.0000 mg | ORAL_TABLET | Freq: Three times a day (TID) | ORAL | 5 refills | Status: DC

## 2023-09-01 MED ORDER — PREDNISONE 10 MG PO TABS: 10.0000 mg | ORAL_TABLET | Freq: Two times a day (BID) | ORAL | 0 refills | Status: DC

## 2023-09-01 NOTE — Patient Instructions (Signed)
 VISIT SUMMARY:  During your visit, we discussed your chronic hip and back pain, diabetes management, restlessness, and sleep disturbances. We also reviewed your medication regimen and made some adjustments to better manage your symptoms.  YOUR PLAN:  CHRONIC HIP AND BACK PAIN: You have chronic pain in your hips and lower back, which has worsened over the past few months. Your pain is more severe on the right side and is related to your back issues. -Start taking prednisone  10 mg twice a day. -Monitor your blood glucose levels closely while on prednisone . -If your blood glucose exceeds 200 mg/dL, increase your Tresiba  dose by 5 units.  TYPE 2 DIABETES MELLITUS WITH POOR GLYCEMIC CONTROL: Your blood sugar levels are not well controlled, with a recent reading of 329 mg/dL. We need to improve your glucose monitoring and management. -Increase your Mounjaro  dosage 12.5 mg weekly. -Switch to Dexcom for continuous glucose monitoring. -If your blood glucose exceeds 200 mg/dL while on prednisone , increase your Tresiba  dose by 5 units.  BINGE EATING DISORDER: You have a binge eating disorder that is affecting your blood sugar control, especially with increased nighttime eating. -Continue your current treatment with Vyvanse .  PERIPHERAL NEUROPATHY: You have significant symptoms of peripheral neuropathy, and you are currently taking gabapentin  at the maximum dosage. -Switch to gabapentin  800 mg three times a day to reduce the number of pills you need to take.  RESTLESSNESS AND AGITATION LIKELY RELATED TO ANXIETY: You experience restlessness and agitation, likely due to anxiety. -Increase your buspirone  dose to 10 mg three times a day.  INSOMNIA: You have trouble sleeping, which may be worsened by your pain and restlessness. -Continue taking Rozerem  for sleep.  THROMBOCYTOPENIA: You have low platelet counts, which limits your use of anti-inflammatory medications due to the risk of bleeding. -Avoid  anti-inflammatory medications like ibuprofen  and Aleve.

## 2023-09-01 NOTE — Progress Notes (Signed)
 Subjective:  Patient ID: Linda [name], female    DOB: 25-Sep-1970  Age: 53 y.o. MRN: [id number]  Chief Complaint  Patient presents with   Medical Management of Chronic Issues   Back Pain   Discussed the use of AI scribe software for clinical note transcription with the patient, who gave verbal consent to proceed.  HPI: Diabetes:  Complications: HYPERTENSION, neuropathy Glucose checking: CGM Free Style Libre 2, however they do not stick on her arm. She is using glucometer. Checking sugar once a day.  Glucose logs: 300's  Hypoglycemia: rarely  Most recent A1C: 6.5 Current medications: Mounjaro  10 mg once weekly, Synjardy  12.5 mg/ 1000 mg 1 tablet twice daily, Tresiba  inject 68 units daily. Last Eye Exam: June/2025 Foot checks: Daily - Increased nighttime eating without hunger, impacting blood sugar contro. - try to work on eating healthier.   Hyperlipidemia: Current medications: Taking Atorvastatin  10 mg 1 tablet daily,  Hypertension Complications: DM Current medications: Losartan  potassium 50 mg take 1 tablet daily, Potassium Chloride  10 meq take 2 capsules twice daily.  Acquired Hypothyroidism: Patient is currently taking Levothyroxine  75 mcg daily.  Chronic Pain Syndrome:  - Chronic hip and back pain with worsening symptoms over the past couple of months - Pain is primarily on the right hip, with some involvement of the left - Pain originates from the back and intensifies upon standing - Requires a rollator for mobility - Takes morphine  30 mg twice daily for pain control and Gabapentin  400 mg take 2 capsules three times a day.  - History of spinal fusion surgery without severe pain at that time  Binge eating disorder: Patient is currently taking Vyvanse  70 mg daily.  Vitamin D  deficiency: Patient is currently taking Vitamin D  50,000 units 1 capsule weekly.  GERD:  Pantoprazole  40 mg 1 tablet TWICE DAILY.  Depression, mild, recurrent: Patient is currently  taking Fetzima  80 mg daily, Bupropion  XL 300 mg 1 tablet daily, Buspirone  5 mg take 1 tablet three times a day, vraylar  3 mg daily.   IBS D: has dicyclomine  that uses as needed.   Restlessness and sleep disturbance - Significant restlessness and anxiety, particularly at night - Sleep is affected by restlessness and anxiety - plan to increase buspirone  10 mg tid.  - Takes Rozerem  for sleep     09/01/2023   10:33 AM 05/18/2023    1:32 PM 05/11/2023   12:53 PM 05/03/2023    7:48 AM 01/24/2023    7:30 AM  Depression screen PHQ 2/9  Decreased Interest 1 1 1  0 0  Down, Depressed, Hopeless 1 2 1  0 1  PHQ - 2 Score 2 3 2  0 1  Altered sleeping 3 2 2 3 1   Tired, decreased energy 3 2 2 1 1   Change in appetite 3 1 1 1 2   Feeling bad or failure about yourself  0 1 1 0 0  Trouble concentrating 1 1 1 2 2   Moving slowly or fidgety/restless 2 0 0 0 2  Suicidal thoughts 0 0 0 0 0  PHQ-9 Score 14 10 9 7 9   Difficult doing work/chores Somewhat difficult Somewhat difficult   Very difficult        09/01/2023   10:33 AM  Fall Risk   Falls in the past year? 1  Number falls in past yr: 1  Injury with Fall? 0  Risk for fall due to : History of fall(s);Impaired balance/gait;Impaired mobility  Follow up Falls prevention discussed  09/01/2023   10:34 AM 05/18/2023    1:37 PM 05/11/2023    1:01 PM 01/24/2023    7:31 AM  GAD 7 : Generalized Anxiety Score  Nervous, Anxious, on Edge 3 3 3 1   Control/stop worrying 3 3 3 1   Worry too much - different things 3 3 3 1   Trouble relaxing 3 3 3 3   Restless 3 2 1 3   Easily annoyed or irritable 2 0 2 1  Afraid - awful might happen 1 1 1 1   Total GAD 7 Score 18 15 16 11   Anxiety Difficulty Somewhat difficult Somewhat difficult Somewhat difficult Somewhat difficult     Patient Care Team: Sherre Clapper, MD as PCP - General (Internal Medicine) Cornelius Wanda DEL, MD as Consulting Physician (Oncology) Erasmo Bernardino BRAVO, OD (Optometry)   Review of Systems   Constitutional:  Negative for chills, fatigue and fever.  HENT:  Negative for congestion, ear pain and sore throat.   Respiratory:  Negative for cough and shortness of breath.   Cardiovascular:  Negative for chest pain and palpitations.  Gastrointestinal:  Negative for abdominal pain, constipation, diarrhea, nausea and vomiting.  Endocrine: Negative for polydipsia, polyphagia and polyuria.  Genitourinary:  Negative for difficulty urinating and dysuria.  Musculoskeletal:  Positive for arthralgias (Hip pain) and back pain. Negative for myalgias.  Skin:  Negative for rash.  Neurological:  Negative for headaches.  Psychiatric/Behavioral:  Negative for dysphoric mood. The patient is not nervous/anxious.     Current Outpatient Medications on File Prior to Visit  Medication Sig Dispense Refill   albuterol  (VENTOLIN  HFA) 108 (90 Base) MCG/ACT inhaler INHALE 2 PUFFS INTO THE LUNGS EVERY 6 HOURS AS NEEDED FOR WHEEZING OR SHORTNESS OF BREATH 8.5 g 1   amitriptyline  (ELAVIL ) 25 MG tablet TAKE ONE TABLET BY MOUTH AT BEDTIME 30 tablet 2   atorvastatin  (LIPITOR) 10 MG tablet TAKE ONE TABLET BY MOUTH EVERY EVENING 90 tablet 1   Blood Glucose Monitoring Suppl (ONETOUCH VERIO REFLECT) w/Device KIT AS DIRECTED     Blood Pressure Monitoring (SPHYGMOMANOMETER) MISC 1 each by Does not apply route daily in the afternoon. 1 each 0   buPROPion  (WELLBUTRIN  XL) 300 MG 24 hr tablet TAKE ONE TABLET BY MOUTH EVERY DAY 90 tablet 1   clotrimazole -betamethasone  (LOTRISONE ) cream Apply topically 2 (two) times daily.     cyclobenzaprine  (FLEXERIL ) 10 MG tablet TAKE ONE TABLET BY MOUTH EVERY 8 HOURS AS NEEDED FOR MUSCLE SPASMS 270 tablet 1   dicyclomine  (BENTYL ) 20 MG tablet TAKE ONE TABLET BY MOUTH BEFORE MEALS AND AT BEDTIME AS NEEDED FOR STOMACH CRAMPING 180 tablet 1   Empagliflozin-metFORMIN HCl (SYNJARDY ) 12.05-998 MG TABS Take 1 tablet by mouth 2 (two) times daily. 60 tablet 3   FETZIMA  80 MG CP24 TAKE ONE CAPSULE BY  MOUTH EVERY EVENING 90 capsule 1   glucose blood test strip Use as instructed to check FBS daily. E11.40 100 each 12   Lancets (ONETOUCH DELICA PLUS LANCET30G) MISC USE TO check blood glucose 2-3 times daily AS DIRECTED 100 each 3   levothyroxine  (SYNTHROID ) 75 MCG tablet TAKE ONE TABLET BY MOUTH EVERY DAY 90 tablet 1   lisdexamfetamine (VYVANSE ) 70 MG capsule TAKE ONE CAPSULE BY MOUTH EVERY DAY 30 capsule 0   losartan  (COZAAR ) 50 MG tablet TAKE ONE TABLET BY MOUTH EVERY EVENING 90 tablet 1   Magnesium  500 MG CAPS Take 1 capsule (500 mg total) by mouth daily. 90 capsule 1   metoprolol  succinate (TOPROL -XL) 25  MG 24 hr tablet TAKE 1/2 TABLET BY MOUTH EVERY DAY 45 tablet 1   morphine  (MS CONTIN ) 30 MG 12 hr tablet Take 1 tablet (30 mg total) by mouth every 12 (twelve) hours. 60 tablet 0   Multiple Vitamin (MULTIVITAMIN WITH MINERALS) TABS tablet Take 1 tablet by mouth daily. Solar ray     mupirocin  ointment (BACTROBAN ) 2 % Apply 1 Application topically 2 (two) times daily. 30 g 2   nystatin  (MYCOSTATIN /NYSTOP ) powder Apply 1 Application topically 3 (three) times daily. 15 g 0   ondansetron  (ZOFRAN ) 4 MG tablet Take 1 tablet (4 mg total) by mouth every 4 (four) hours as needed for nausea. 90 tablet 3   OVER THE COUNTER MEDICATION Take 1 tablet by mouth in the morning and at bedtime. Lutein for eyes     pantoprazole  (PROTONIX ) 40 MG tablet TAKE ONE TABLET BY MOUTH TWICE DAILY 180 tablet 1   polyethylene glycol (MIRALAX  / GLYCOLAX ) 17 g packet Take 17 g by mouth daily as needed for moderate constipation.     potassium chloride  (MICRO-K ) 10 MEQ CR capsule TAKE 2 CAPSULES BY MOUTH EVERY DAY and TAKE 2 CAPSULES BY MOUTH EVERY EVENING 360 capsule 1   Probiotic Product (PROBIOTIC PO) Take 1 capsule by mouth in the morning. 3.2 billion cfu     ramelteon  (ROZEREM ) 8 MG tablet TAKE ONE TABLET BY MOUTH AT BEDTIME 90 tablet 1   rOPINIRole  (REQUIP ) 0.25 MG tablet TAKE ONE TABLET BY MOUTH AT BEDTIME 90 tablet 1    SURE COMFORT PEN NEEDLES 32G X 4 MM MISC Use new needle with each injection. 100 each 1   TRESIBA  FLEXTOUCH 200 UNIT/ML FlexTouch Pen INJECT 60 UNITS SUBCUTANEOUSLY DAILY 9 mL 3   Vitamin D , Ergocalciferol , (DRISDOL ) 1.25 MG (50000 UNIT) CAPS capsule TAKE ONE CAPSULE BY MOUTH EVERY FRIDAY AT 8AM 12 capsule 1   VRAYLAR  3 MG capsule TAKE ONE CAPSULE BY MOUTH EVERY DAY 90 capsule 0   Continuous Blood Gluc Receiver (FREESTYLE LIBRE 2 READER) DEVI E11.69 Check blood sugar 4 times daily as directed (Patient not taking: Reported on 09/01/2023) 1 each 0   Continuous Glucose Sensor (FREESTYLE LIBRE 3 PLUS SENSOR) MISC Change sensor every 15 days. (Patient not taking: Reported on 09/01/2023) 6 each 1   Current Facility-Administered Medications on File Prior to Visit  Medication Dose Route Frequency Provider Last Rate Last Admin   sodium chloride  flush (NS) 0.9 % injection 10 mL  10 mL Intracatheter PRN Mosher, Andrez A, PA-C   10 mL at 02/19/21 1004   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 2 diabetes mellitus, with fat layer exposed (HCC) 03/27/2020   Gastritis    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 No longer has   Past Surgical History:  Procedure Laterality Date   ACHILLES TENDON LENGTHENING Left 04/09/2023   Procedure: LENGTHENING, TENDON, ACHILLES;  Surgeon: Lamount Ethan CROME, DPM;  Location: [facility];  Service: Podiatry;  Laterality: Left;   AMPUTATION TOE Bilateral 03/04/2022   Procedure: AMPUTATION TOE RIGHT FOOT PARTIAL OR TOTAL GREAT TOE AND SECOND TOE, LEFT FOOT PARTIAL OR TOTAL GREAT TOE, SECOND AND THIRD TOE;  Surgeon: Malvin Marsa FALCON, DPM;  Location: [facility];  Service: Podiatry;  Laterality: Bilateral;   AMPUTATION TOE Right 01/04/2023   Procedure: R 1ST TOE AMPUTATION, RIGHT 3RD TOE AMPUTATION;  Surgeon: Malvin Marsa FALCON, DPM;  Location: [facility];  Service: Orthopedics/Podiatry;  Laterality: Right;   APPENDECTOMY     BACK SURGERY     x 3 lower disc   BILATERAL TOTAL MASTECTOMY WITH AXILLARY LYMPH NODE DISSECTION Bilateral 09/2019   CHOLECYSTECTOMY     nephrolithiasis     ROBOTIC ASSISTED TOTAL HYSTERECTOMY WITH BILATERAL SALPINGO OOPHERECTOMY Bilateral 06/14/2016   Procedure: ROBOTIC ASSISTED TOTAL HYSTERECTOMY WITH BILATERAL SALPINGO OOPHORECTOMY;  Surgeon: Dodie Shadow, MD;  Location: [facility];  Service: Gynecology;  Laterality: Bilateral;   TRANSMETATARSAL AMPUTATION Left 04/09/2023   Procedure: LEFT TRANSMETATARSAL AMPUTATION;  Surgeon: Lamount Ethan CROME, DPM;  Location: [facility];  Service: Podiatry;  Laterality: Left;    Family History  Problem Relation Age of Onset   Breast cancer Mother        breast   CAD Father    Diabetes Father    Heart failure Father    Cancer Father        renal carcinoma   Kidney failure Father    Kidney cancer Father    Kidney cancer Brother 67       renal carcinoma.   Stroke Paternal Grandmother     Social History   Socioeconomic History   Marital status: Divorced    Spouse name: Not on file   Number of children: Not on file   Years of education: Not on file   Highest education level: Associate degree: academic program  Occupational History   Occupation: disabled  Tobacco Use   Smoking status: Never   Smokeless tobacco: Never  Vaping Use   Vaping status: Never Used  Substance and Sexual Activity   Alcohol use: No   Drug use: No   Sexual activity: Yes  Other Topics Concern   Not on file  Social History Narrative   wears sunscreen, brushes and flosses daily, see's dentist bi-annually, has smoke/carbon monoxide detectors, wears a seatbelt and practices gun safety   Social Drivers of Health   Financial Resource Strain: High Risk (01/21/2023)   Overall Financial Resource Strain (CARDIA)    Difficulty of Paying Living Expenses: Very hard  Food Insecurity: No Food Insecurity (07/20/2023)   Hunger Vital Sign    Worried About Running Out of Food in the Last Year: Never true    Ran Out of Food in the Last Year: Never true  Recent Concern:  Food Insecurity - Food Insecurity Present (06/22/2023)   Hunger Vital Sign    Worried About Running Out of Food in the Last Year: Sometimes true    Ran Out of Food in the Last Year: Sometimes true  Transportation Needs: No Transportation Needs (07/20/2023)   PRAPARE - Administrator, Civil Service (Medical): No    Lack of Transportation (Non-Medical): No  Physical Activity: Inactive (01/21/2023)   Exercise Vital Sign    Days of Exercise per Week: 0 days    Minutes of Exercise per Session: 0 min  Stress: Stress Concern Present (01/21/2023)   [employer] of Occupational Health - Occupational Stress Questionnaire    Feeling of Stress : Very much  Social Connections: Moderately Isolated (01/21/2023)   Social Connection and Isolation Panel    Frequency of Communication with Friends and Family: More than three times a week     Frequency of Social Gatherings with Friends and Family: Never    Attends Religious Services: 1 to 4 times per year    Active Member of [employer] or Organizations: No    Attends Engineer, structural: Not on file    Marital Status: Divorced    Objective:  BP 110/60   Pulse (!) 106   Temp (!) 97.3 F (36.3 C)   Resp 16   Ht 5' 2 (1.575 m)   Wt 207 lb (93.9 kg)   LMP 06/13/2009   SpO2 99%   BMI 37.86 kg/m      09/01/2023   10:18 AM 06/30/2023    1:31 PM 06/21/2023    1:18 PM  BP/Weight  Systolic BP 110 106 137  Diastolic BP 60 84 80  Wt. (Lbs) 207 206.8   BMI 37.86 kg/m2 37.82 kg/m2     Physical Exam Vitals reviewed.  Constitutional:      Appearance: Normal appearance. She is obese.  Neck:     Vascular: No carotid bruit.  Cardiovascular:     Rate and Rhythm: Normal rate and regular rhythm.     Pulses: Normal pulses.     Heart sounds: Normal heart sounds.  Pulmonary:     Effort: Pulmonary effort is normal. No respiratory distress.     Breath sounds: Normal breath sounds.  Abdominal:     General: Abdomen is flat. Bowel sounds are normal.     Palpations: Abdomen is soft.     Tenderness: There is no abdominal tenderness.  Neurological:     Mental Status: She is alert and oriented to person, place, and time.  Psychiatric:        Mood and Affect: Mood normal.        Behavior: Behavior normal.      Diabetic foot exam was performed with the following findings:   Intact posterior tibialis and dorsalis pedis pulses Left foot amputation mid foot.  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 No sensation on feet BL.       Lab Results  Component Value Date   WBC 4.9 09/01/2023   HGB 12.1 09/01/2023   HCT 40.3 09/01/2023   PLT 76 (LL) 09/01/2023   GLUCOSE 232 (H) 09/01/2023   CHOL 157 09/01/2023   TRIG 180 (H) 09/01/2023   HDL 45 09/01/2023   LDLCALC 81 09/01/2023   ALT 17 09/01/2023   AST 22 09/01/2023   NA 143 09/01/2023   K 4.4 09/01/2023   CL 102  09/01/2023   CREATININE 0.97 09/01/2023   BUN 8 09/01/2023  CO2 23 09/01/2023   TSH 1.270 01/02/2023   INR 1.0 03/29/2023   HGBA1C 8.6 (H) 09/01/2023   MICROALBUR 30 10/09/2019      Assessment & Plan:  Lumbar back pain with radiculopathy affecting right lower extremity Assessment & Plan: Prednisone  10 mg twice daily x 10 days.  Morphine  prescription.  Gabapentin  prescription.   Orders: -     predniSONE ; Take 1 tablet (10 mg total) by mouth 2 (two) times daily with a meal.  Dispense: 20 tablet; Refill: 0 -     Gabapentin ; Take 1 tablet (800 mg total) by mouth 3 (three) times daily.  Dispense: 270 tablet; Refill: 1  Type 2 diabetes mellitus with diabetic neuropathy, with long-term current use of insulin  (HCC) Assessment & Plan: Poor glycemic control with blood glucose at 329 mg/dL. Issues with Freestyle Libre sensor adhesion.  - Plan to switch to Dexcom G7 for better monitoring. - Increase Mounjaro  to 12.5 mg weekly - Switch to Dexcom for continuous glucose monitoring. - Advise to increase Tresiba  by 5 units if blood glucose exceeds 200 mg/dL while on prednisone .  Orders: -     Hemoglobin A1c -     Microalbumin / creatinine urine ratio -     busPIRone  HCl; Take 1 tablet (10 mg total) by mouth 3 (three) times daily.  Dispense: 90 tablet; Refill: 5 -     Tirzepatide ; Inject 12.5 mg into the skin once a week.  Dispense: 6 mL; Refill: 0 -     Gabapentin ; Take 1 tablet (800 mg total) by mouth 3 (three) times daily.  Dispense: 270 tablet; Refill: 1  Essential hypertension, benign Assessment & Plan: Well controlled.  No changes to medicines. Continue Losartan  potassium 50 mg take 1 tablet daily, Potassium Chloride  10 meq take 2 capsules BID Continue to work on eating a healthy diet and exercise.  Labs drawn today.    Orders: -     CBC with Differential/Platelet -     Comprehensive metabolic panel with GFR  Mixed hyperlipidemia Assessment & Plan: Well controlled.  No  changes to medicines. Taking Atorvastatin  10 mg 1 tablet daily. Continue to work on eating a healthy diet and exercise.  Labs drawn today.  Orders: -     Lipid panel  Acquired hypothyroidism  Chronic pain syndrome Assessment & Plan: Change gabapentin  to 800 mg three times a day.  Continue morphine  sulfate ER 30 mg twice daily.  Worsened.   Orders: -     Pain Mgt Scrn (14 [facility]), Ur  Liver cirrhosis secondary to NASH (nonalcoholic steatohepatitis) (HCC) Assessment & Plan: Check chemistry panel (liver and kidney function).  Check blood count.    GAD (generalized anxiety disorder) Assessment & Plan: Poorly controlled.  Increase buspirone  10 mg three times a day.  Continue Fetzima  80 mg daily, Bupropion  XL 300 mg 1 tablet daily, and vraylar  3 mg daily.     Uncomplicated opioid dependence (HCC) Assessment & Plan: Check UDS   Morbid obesity (HCC) Assessment & Plan: BMI 37.  Recommend continue to work on eating healthy diet and exercise. COMORBIDITY: diabetes.       Meds ordered this encounter  Medications   predniSONE  (DELTASONE ) 10 MG tablet    Sig: Take 1 tablet (10 mg total) by mouth 2 (two) times daily with a meal.    Dispense:  20 tablet    Refill:  0   busPIRone  (BUSPAR ) 10 MG tablet    Sig: Take 1 tablet (10 mg total) by  mouth 3 (three) times daily.    Dispense:  90 tablet    Refill:  5    This prescription was filled on 07/14/2023. Any refills authorized will be placed on file.   tirzepatide  (MOUNJARO ) 12.5 MG/0.5ML Pen    Sig: Inject 12.5 mg into the skin once a week.    Dispense:  6 mL    Refill:  0   gabapentin  (NEURONTIN ) 800 MG tablet    Sig: Take 1 tablet (800 mg total) by mouth 3 (three) times daily.    Dispense:  270 tablet    Refill:  1    Cancel previous rxs for gabapentin , as well as buspirone  and mounjaro . Sent new doses for all today.    Orders Placed This Encounter  Procedures   CBC with Differential/Platelet   Comprehensive  metabolic panel with GFR   Hemoglobin A1c   Lipid panel   Microalbumin / creatinine urine ratio   Pain Mgt Scrn (14 [facility]), Ur     Follow-up: Return in about 3 months (around 12/02/2023) for chronic follow up, 40 minutes please.   I,Marla I Leal-Borjas,acting as a scribe for Abigail Free, MD.,have documented all relevant documentation on the behalf of Abigail Free, MD,as directed by  Abigail Free, MD while in the presence of Abigail Free, MD.   An After Visit Summary was printed and given to the patient.  I attest that I have reviewed this visit and agree with the plan scribed by my staff.   Abigail Free, MD [facility] [phone]

## 2023-09-02 DIAGNOSIS — M5416 Radiculopathy, lumbar region: Secondary | ICD-10-CM | POA: Insufficient documentation

## 2023-09-02 DIAGNOSIS — F411 Generalized anxiety disorder: Secondary | ICD-10-CM | POA: Insufficient documentation

## 2023-09-02 NOTE — Assessment & Plan Note (Signed)
Check blood count

## 2023-09-02 NOTE — Assessment & Plan Note (Signed)
 Check chemistry panel (liver and kidney function).  Check blood count.

## 2023-09-02 NOTE — Assessment & Plan Note (Signed)
 Poorly controlled.  Increase buspirone  10 mg three times a day.  Continue Fetzima  80 mg daily, Bupropion  XL 300 mg 1 tablet daily, and vraylar  3 mg daily.

## 2023-09-02 NOTE — Assessment & Plan Note (Signed)
-   Check UDS

## 2023-09-02 NOTE — Assessment & Plan Note (Signed)
Well controlled.  No changes to medicines. Continue Losartan potassium 50 mg take 1 tablet daily, Potassium Chloride 10 meq take 2 capsules BID Continue to work on eating a healthy diet and exercise.  Labs drawn today.

## 2023-09-02 NOTE — Assessment & Plan Note (Signed)
 Change gabapentin  to 800 mg three times a day.  Continue morphine  sulfate ER 30 mg twice daily.  Worsened.

## 2023-09-02 NOTE — Assessment & Plan Note (Signed)
 Continue to work on [employer].  Increase mounjaro  to 12.5 mg weekly.  Unable to exercise.

## 2023-09-02 NOTE — Assessment & Plan Note (Signed)
 Prednisone  10 mg twice daily x 10 days.  Morphine  prescription.  Gabapentin  prescription.

## 2023-09-02 NOTE — Assessment & Plan Note (Signed)
 Poor glycemic control with blood glucose at 329 mg/dL. Issues with Freestyle Libre sensor adhesion.  - Plan to switch to Dexcom G7 for better monitoring. - Increase Mounjaro  to 12.5 mg weekly - Switch to Dexcom for continuous glucose monitoring. - Advise to increase Tresiba  by 5 units if blood glucose exceeds 200 mg/dL while on prednisone .

## 2023-09-02 NOTE — Assessment & Plan Note (Signed)
 Well controlled.  No changes to medicines. Taking Atorvastatin  10 mg 1 tablet daily. Continue to work on eating a healthy diet and exercise.  Labs drawn today.

## 2023-09-03 ENCOUNTER — Ambulatory Visit: Payer: Self-pay | Admitting: Family Medicine

## 2023-09-03 LAB — LIPID PANEL
Chol/HDL Ratio: 3.5 ratio (ref 0.0–4.4)
Cholesterol, Total: 157 mg/dL (ref 100–199)
HDL: 45 mg/dL (ref >39)
LDL Chol Calc (NIH): 81 mg/dL (ref 0–99)
Triglycerides: 180 mg/dL — ABNORMAL HIGH (ref 0–149)
VLDL Cholesterol Cal: 31 mg/dL (ref 5–40)

## 2023-09-03 LAB — MICROALBUMIN / CREATININE URINE RATIO
Creatinine, Urine: 66.6 mg/dL
Microalb/Creat Ratio: 5 mg/g{creat} (ref 0–29)
Microalbumin, Urine: 3.1 ug/mL

## 2023-09-03 LAB — COMPREHENSIVE METABOLIC PANEL WITH GFR
ALT: 17 IU/L (ref 0–32)
AST: 22 IU/L (ref 0–40)
Albumin: 4.2 g/dL (ref 3.8–4.9)
Alkaline Phosphatase: 133 IU/L — ABNORMAL HIGH (ref 44–121)
BUN/Creatinine Ratio: 8 — ABNORMAL LOW (ref 9–23)
BUN: 8 mg/dL (ref 6–24)
Bilirubin Total: 0.5 mg/dL (ref 0.0–1.2)
CO2: 23 mmol/L (ref 20–29)
Calcium: 9.5 mg/dL (ref 8.7–10.2)
Chloride: 102 mmol/L (ref 96–106)
Creatinine, Ser: 0.97 mg/dL (ref 0.57–1.00)
Globulin, Total: 2.5 g/dL (ref 1.5–4.5)
Glucose: 232 mg/dL — ABNORMAL HIGH (ref 70–99)
Potassium: 4.4 mmol/L (ref 3.5–5.2)
Sodium: 143 mmol/L (ref 134–144)
Total Protein: 6.7 g/dL (ref 6.0–8.5)
eGFR: 69 mL/min/1.73 (ref >59)

## 2023-09-03 LAB — CBC WITH DIFFERENTIAL/PLATELET
Basophils Absolute: 0 x10E3/uL (ref 0.0–0.2)
Basos: 0 %
EOS (ABSOLUTE): 0.1 x10E3/uL (ref 0.0–0.4)
Eos: 1 %
Hematocrit: 40.3 % (ref 34.0–46.6)
Hemoglobin: 12.1 g/dL (ref 11.1–15.9)
Immature Grans (Abs): 0 x10E3/uL (ref 0.0–0.1)
Immature Granulocytes: 0 %
Lymphocytes Absolute: 1 x10E3/uL (ref 0.7–3.1)
Lymphs: 20 %
MCH: 26.5 pg — ABNORMAL LOW (ref 26.6–33.0)
MCHC: 30 g/dL — ABNORMAL LOW (ref 31.5–35.7)
MCV: 88 fL (ref 79–97)
Monocytes Absolute: 0.3 x10E3/uL (ref 0.1–0.9)
Monocytes: 5 %
Neutrophils Absolute: 3.5 x10E3/uL (ref 1.4–7.0)
Neutrophils: 74 %
Platelets: 76 x10E3/uL — CL (ref 150–450)
RBC: 4.56 x10E6/uL (ref 3.77–5.28)
RDW: 15.5 % — ABNORMAL HIGH (ref 11.7–15.4)
WBC: 4.9 x10E3/uL (ref 3.4–10.8)

## 2023-09-03 LAB — HEMOGLOBIN A1C
Est. average glucose Bld gHb Est-mCnc: 200 mg/dL
Hgb A1c MFr Bld: 8.6 % — ABNORMAL HIGH (ref 4.8–5.6)

## 2023-09-04 ENCOUNTER — Telehealth: Payer: Self-pay

## 2023-09-04 LAB — PAIN MGT SCRN (14 DRUGS), UR
Amphetamine Scrn, Ur: POSITIVE ng/mL — AB
BARBITURATE SCREEN URINE: NEGATIVE ng/mL
BENZODIAZEPINE SCREEN, URINE: NEGATIVE ng/mL
Buprenorphine, Urine: NEGATIVE ng/mL
CANNABINOIDS UR QL SCN: NEGATIVE ng/mL
Cocaine (Metab) Scrn, Ur: NEGATIVE ng/mL
Creatinine(Crt), U: 68 mg/dL (ref 20.0–300.0)
Fentanyl, Urine: NEGATIVE pg/mL
Meperidine Screen, Urine: NEGATIVE ng/mL
Methadone Screen, Urine: NEGATIVE ng/mL
OXYCODONE+OXYMORPHONE UR QL SCN: NEGATIVE ng/mL
Opiate Scrn, Ur: POSITIVE ng/mL — AB
Ph of Urine: 5 (ref 4.5–8.9)
Phencyclidine Qn, Ur: NEGATIVE ng/mL
Propoxyphene Scrn, Ur: NEGATIVE ng/mL
Tramadol Screen, Urine: NEGATIVE ng/mL

## 2023-09-04 NOTE — Telephone Encounter (Signed)
 Crystal from [facility] called with lab alert for platelets 76. Dr Sherre was notified.

## 2023-09-05 ENCOUNTER — Ambulatory Visit: Payer: Self-pay

## 2023-09-05 NOTE — Telephone Encounter (Addendum)
 First attempt; no answer Second attempt; no answer Third attempt; no answer    Copied from CRM [id number]. Topic: Clinical - Prescription Issue >> Sep 05, 2023  2:05 PM Rosaria BRAVO wrote: Reason for CRM: Pt believes her recent change in medication (steroid) is raising her blood sugar. She is concerned.   Best contact: [phone]

## 2023-09-05 NOTE — Assessment & Plan Note (Signed)
 BMI 37.  Recommend continue to work on eating healthy diet and exercise. COMORBIDITY: diabetes.

## 2023-09-06 ENCOUNTER — Other Ambulatory Visit: Payer: Self-pay

## 2023-09-06 ENCOUNTER — Encounter: Payer: Self-pay | Admitting: Family Medicine

## 2023-09-06 MED ORDER — DEXCOM G6 SENSOR MISC: 2 refills | Status: DC

## 2023-09-11 ENCOUNTER — Other Ambulatory Visit: Payer: Self-pay | Admitting: Family Medicine

## 2023-09-11 ENCOUNTER — Other Ambulatory Visit: Payer: Self-pay | Admitting: Physician Assistant

## 2023-09-11 DIAGNOSIS — F50819 Binge eating disorder, unspecified: Secondary | ICD-10-CM

## 2023-09-11 DIAGNOSIS — E1140 Type 2 diabetes mellitus with diabetic neuropathy, unspecified: Secondary | ICD-10-CM

## 2023-09-11 DIAGNOSIS — K7460 Unspecified cirrhosis of liver: Secondary | ICD-10-CM

## 2023-09-12 ENCOUNTER — Encounter: Payer: Self-pay | Admitting: Family Medicine

## 2023-09-12 DIAGNOSIS — Z89432 Acquired absence of left foot: Secondary | ICD-10-CM | POA: Diagnosis not present

## 2023-09-12 DIAGNOSIS — M86672 Other chronic osteomyelitis, left ankle and foot: Secondary | ICD-10-CM | POA: Diagnosis not present

## 2023-09-12 DIAGNOSIS — B372 Candidiasis of skin and nail: Secondary | ICD-10-CM | POA: Diagnosis not present

## 2023-09-12 DIAGNOSIS — R252 Cramp and spasm: Secondary | ICD-10-CM | POA: Diagnosis not present

## 2023-09-12 DIAGNOSIS — E118 Type 2 diabetes mellitus with unspecified complications: Secondary | ICD-10-CM | POA: Diagnosis not present

## 2023-09-13 ENCOUNTER — Other Ambulatory Visit: Payer: Self-pay | Admitting: Family Medicine

## 2023-09-13 MED ORDER — FREESTYLE LIBRE 3 PLUS SENSOR MISC: 3 refills | Status: AC

## 2023-09-19 ENCOUNTER — Telehealth: Payer: Self-pay | Admitting: Podiatry

## 2023-09-19 NOTE — Telephone Encounter (Signed)
 [facility] PA [facility]  FAXED TO [facility]

## 2023-09-20 DIAGNOSIS — K7581 Nonalcoholic steatohepatitis (NASH): Secondary | ICD-10-CM | POA: Diagnosis not present

## 2023-09-20 DIAGNOSIS — K7460 Unspecified cirrhosis of liver: Secondary | ICD-10-CM | POA: Diagnosis not present

## 2023-09-20 DIAGNOSIS — K7030 Alcoholic cirrhosis of liver without ascites: Secondary | ICD-10-CM | POA: Diagnosis not present

## 2023-09-20 LAB — LAB REPORT - SCANNED: EGFR: 102

## 2023-09-27 ENCOUNTER — Telehealth: Payer: Self-pay

## 2023-09-27 NOTE — Telephone Encounter (Signed)
 Copied from CRM #[id number]. Topic: General - Other >> Sep 27, 2023 12:22 PM Wess RAMAN wrote: Reason for CRM: Gearline, palliative care nurse,  wanted to speak with Dr. Sherre or her nurse about patient's conditions. She stated patient is not sleeping through the night. Patient stated current medication is not working.  Callback #:  [id number]

## 2023-09-28 ENCOUNTER — Other Ambulatory Visit: Payer: Self-pay | Admitting: Family Medicine

## 2023-09-28 MED ORDER — BELSOMRA 10 MG PO TABS: 10.0000 mg | ORAL_TABLET | Freq: Every evening | ORAL | 5 refills | Status: AC | PRN

## 2023-09-28 NOTE — Telephone Encounter (Signed)
 Spoke with Janneth, aware for patient to stop rozerem . She could not confirm if patient has tried belsomra , based on chart notes it does not look like she has and requested that the rx be sent in.

## 2023-10-04 ENCOUNTER — Inpatient Hospital Stay: Attending: Oncology | Admitting: Oncology

## 2023-10-04 ENCOUNTER — Encounter: Payer: Self-pay | Admitting: Hematology and Oncology

## 2023-10-04 ENCOUNTER — Inpatient Hospital Stay

## 2023-10-04 ENCOUNTER — Other Ambulatory Visit: Payer: Self-pay | Admitting: Oncology

## 2023-10-04 ENCOUNTER — Encounter: Payer: Self-pay | Admitting: Oncology

## 2023-10-04 ENCOUNTER — Telehealth: Payer: Self-pay | Admitting: Oncology

## 2023-10-04 VITALS — BP 129/75 | HR 99 | Temp 98.3°F | Resp 18 | Ht 62.0 in | Wt 206.4 lb

## 2023-10-04 DIAGNOSIS — Z1509 Genetic susceptibility to other malignant neoplasm: Secondary | ICD-10-CM

## 2023-10-04 DIAGNOSIS — Z1501 Genetic susceptibility to malignant neoplasm of breast: Secondary | ICD-10-CM | POA: Insufficient documentation

## 2023-10-04 DIAGNOSIS — K7460 Unspecified cirrhosis of liver: Secondary | ICD-10-CM | POA: Diagnosis not present

## 2023-10-04 DIAGNOSIS — Z8051 Family history of malignant neoplasm of kidney: Secondary | ICD-10-CM | POA: Insufficient documentation

## 2023-10-04 DIAGNOSIS — D693 Immune thrombocytopenic purpura: Secondary | ICD-10-CM | POA: Diagnosis not present

## 2023-10-04 DIAGNOSIS — Z90722 Acquired absence of ovaries, bilateral: Secondary | ICD-10-CM | POA: Diagnosis not present

## 2023-10-04 DIAGNOSIS — Z853 Personal history of malignant neoplasm of breast: Secondary | ICD-10-CM | POA: Insufficient documentation

## 2023-10-04 DIAGNOSIS — K7581 Nonalcoholic steatohepatitis (NASH): Secondary | ICD-10-CM | POA: Insufficient documentation

## 2023-10-04 DIAGNOSIS — Z9079 Acquired absence of other genital organ(s): Secondary | ICD-10-CM | POA: Insufficient documentation

## 2023-10-04 DIAGNOSIS — Z90710 Acquired absence of both cervix and uterus: Secondary | ICD-10-CM | POA: Diagnosis not present

## 2023-10-04 DIAGNOSIS — D696 Thrombocytopenia, unspecified: Secondary | ICD-10-CM

## 2023-10-04 DIAGNOSIS — Z9013 Acquired absence of bilateral breasts and nipples: Secondary | ICD-10-CM | POA: Diagnosis not present

## 2023-10-04 NOTE — Telephone Encounter (Signed)
 Patient has been scheduled for follow-up visit per 10/04/23 LOS.  Pt given an appt calendar with date and time.

## 2023-10-04 NOTE — Progress Notes (Signed)
 [facility]  [address] 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 She was treated with bilateral mastectomy due to BRCA1 mutation.  She completed 4 cycles of Adriamycin /cyclophosphamide  chemotherapy and received 8 out of 12 planned weeks of weekly paclitaxel .  She remains without evidence of recurrence.   BRCA1 positive Positive BRCA 1 mutation, which increases her risk for breast and ovarian cancer, as well as elevates her risk for pancreatic cancer.  She underwent hysterectomy/bilateral salpingo-oophorectomy.  The hepatologist recommended biannual screening for hepatocellular carcinoma with ultrasound alternating with cross-sectional imaging to allow screening for pancreatic cancer.  CT abdomen in August, 2025 did not reveal any evidence of malignancy.     Liver cirrhosis secondary to NASH (nonalcoholic steatohepatitis) (HCC) Liver cirrhosis as seen on CT imaging in August 2020.  She was referred to [facility], CRNP, at the [facility] in [place]. Hepatitis panel was negative.  She received the appropriate vaccines.  Ms. Hays recommended every 6 month screening for hepatocellular cancer with ultrasound alternating with cross-sectional imaging to allow for screening for pancreatic cancer.  Most recent imaging with CT abdomen in August 2024 did not reveal any evidence of malignancy.  Right upper quadrant ultrasound in March did not reveal any evidence of malignancy.  AFP was normal. CT abdomen done on 09/20/2023 which revealed liver cirrhosis with mild hepatomegaly and no focal mass or lesion of the liver. She does have mild  splenomegaly.    Idiopathic thrombocytopenic purpura (ITP) (HCC) Thrombocytopenia, which is felt to be due to chronic ITP and liver cirrhosis.  Her platelet count has fluctuated up and down her last platelet count at her last visit in March was 80,000, so in good range.  She continues to have regular blood work at Dr. [name] office. Platelets are low but stable at 76,000 today.   Plan: She had a CT abdomen done on 09/20/2023 which revealed liver cirrhosis with mild hepatomegaly and no focal mass or lesion of the liver. She does have mild splenomegaly. She had labs done on 09/01/2023 which revealed a WBC of 4.9, hemoglobin of 12.1, and low platelet count of 76,000 but stable. Her CMP was fairly normal other than an elevated glucose of 232 and alkaline phosphatase of 133. She will have her port flushed today and I informed her that she can have this removed at anytime. She states that she will think on this for now. I will see her back in 4 months with CBC, CMP, PT/INR, AFP, and possible port flush. The patient understands the plans discussed today and is in agreement with them.  She knows to contact our office if she develops concerns prior to her next appointment.  I provided 14 minutes of face-to-face time during this encounter and > 50% was spent counseling as documented under my assessment and plan.   Wanda VEAR Cornish, MD  [facility] DE[facility] OF [facility] [address] [place] [address] Dept: [phone] Dept Fax: [phone]   No orders of the defined types were placed in this encounter.   CHIEF COMPLAINT:  CC: Stage IB triple negative breast cancer  Current Treatment: Surveillance  HISTORY OF PRESENT ILLNESS:   Oncology History  Malignant neoplasm of lower-inner quadrant of right breast of female, estrogen receptor negative (HCC)  09/23/2019 Cancer Staging   Staging form: Breast, AJCC 8th Edition - Clinical stage  from 09/23/2019: Stage IB (cT1c(2), cN0(sn), cM0, G3, ER-, PR-, HER2-) - Signed by Cornelius Wanda DEL, MD on 12/23/2019 Staging comments: Bilateral mastectomies,  BRCA 1 pos.   10/01/2019 Initial Diagnosis   Malignant neoplasm of lower-inner quadrant of right breast of female, estrogen receptor negative (HCC)   11/18/2019 - 04/01/2020 Chemotherapy         Hypokalemia (Resolved)  Dehydration (Resolved)      INTERVAL HISTORY: Linda [name] is here today for repeat clinical assessment for her stage IB triple negative breast cancer and BRCA1 positivity. She also has chronic thrombocytopenia related to liver cirrhosis. Patient states that she feels well but complains of back and hip pain rating 5/10. She had a CT abdomen done on 09/20/2023 which revealed liver cirrhosis with mild hepatomegaly and no focal mass or lesion of the liver. She does have mild splenomegaly. She had labs done on 09/01/2023 which revealed a WBC of 4.9, hemoglobin of 12.1, and low platelet count of 76,000 but stable. Her CMP was fairly normal other than an elevated glucose of 232 and alkaline phosphatase of 133. She will have her port flushed today and I informed her that she can have this removed at anytime. She states that she will think on this for now. I will see her back in 4 months with CBC, CMP, PT/INR, AFP, and possible port flush. She denies fever, chills, night sweats, or other signs of infection. She denies cardiorespiratory and gastrointestinal issues. She  denies pain. Her appetite is increased and she states that she is trying to cut back. Her weight has been stable.   REVIEW OF SYSTEMS:  Review of Systems  Constitutional:  Negative for appetite change, chills, fatigue, fever and unexpected weight change.  HENT:  Negative.  Negative for lump/mass, mouth sores, nosebleeds, sore throat and trouble swallowing.   Eyes: Negative.   Respiratory: Negative.  Negative for chest tightness, cough, hemoptysis, shortness of breath and  wheezing.   Cardiovascular: Negative.  Negative for chest pain, leg swelling and palpitations.  Gastrointestinal:  Negative for abdominal distention, abdominal pain, blood in stool, constipation, diarrhea, nausea and vomiting.  Endocrine: Negative.   Genitourinary: Negative.  Negative for difficulty urinating, dysuria, frequency, hematuria and vaginal bleeding.   Musculoskeletal:  Positive for arthralgias, back pain (5/10) and gait problem (uses rolling walker for stability). Negative for flank pain, myalgias and neck pain.  Skin: Negative.  Negative for rash.  Neurological:  Positive for gait problem (uses rolling walker for stability) and numbness (neuropathy hands and feet). Negative for dizziness, extremity weakness, headaches, light-headedness, seizures and speech difficulty.  Hematological:  Negative for adenopathy. Bruises/bleeds easily.  Psychiatric/Behavioral: Negative.  Negative for depression and sleep disturbance. The patient is not nervous/anxious.      VITALS:   Blood pressure 129/75, pulse 99, temperature 98.3 F (36.8 C), temperature source Oral, resp. rate 18, height 5' 2 (1.575 m), weight 206 lb 6.4 oz (93.6 kg), last menstrual period 06/13/2009, SpO2 98%.  Wt Readings from Last 3 Encounters:  10/04/23 206 lb 6.4 oz (93.6 kg)  09/01/23 207 lb (93.9 kg)  06/30/23 206 lb 12.8 oz (93.8 kg)    Body mass index is 37.75 kg/m.  Performance status (ECOG): 1 - Symptomatic but completely ambulatory  Physical Exam Vitals and nursing note reviewed.  Constitutional:      General: She is not in acute distress.    Appearance: Normal appearance. She is obese. She is not ill-appearing.  HENT:     Head: Normocephalic and atraumatic.     Mouth/Throat:     Mouth: Mucous membranes are moist.     Pharynx: Oropharynx is clear. No oropharyngeal exudate or posterior oropharyngeal erythema.  Eyes:     General: No scleral icterus.    Extraocular Movements: Extraocular movements  intact.     Conjunctiva/sclera: Conjunctivae normal.     Pupils: Pupils are equal, round, and reactive to light.  Cardiovascular:     Rate and Rhythm: Normal rate and regular rhythm.     Heart sounds: Normal heart sounds. No murmur heard.    No friction rub. No gallop.  Pulmonary:     Effort: Pulmonary effort is normal.     Breath sounds: Normal breath sounds. No wheezing, rhonchi or rales.  Chest:  Breasts:    Right: Absent.     Left: Absent.     Comments: Bilateral mastectomy sites are negative.  Abdominal:     General: There is no distension.     Palpations: Abdomen is soft. There is hepatomegaly and splenomegaly. There is no mass.     Tenderness: There is no abdominal tenderness.     Comments: Liver is mildly enlarged at 3 finger breaths below the right costal margin Spleen is not palpable but there is a fullness in the left upper quadrant  Musculoskeletal:        General: Normal range of motion.     Cervical back: Normal range of motion and neck supple. No tenderness.     Right lower leg: No edema.     Left lower leg: No edema.  Lymphadenopathy:     Cervical: No cervical adenopathy.     Upper Body:     Right upper body: No supraclavicular or axillary adenopathy.     Left upper body: No supraclavicular or axillary adenopathy.  Skin:    General: Skin is warm and dry.     Coloration: Skin is not jaundiced.     Findings: No rash.  Neurological:     Mental Status: She is alert and oriented to person, place, and time.     Cranial Nerves: No cranial nerve deficit.  Psychiatric:        Mood and Affect: Mood normal.        Behavior: Behavior normal.        Thought Content: Thought content normal.     LABS:      Latest Ref Rng & Units 09/01/2023   11:44 AM 06/30/2023    1:12 PM 06/27/2023    8:58 AM  CBC  WBC 3.4 - 10.8 x10E3/uL 4.9  4.5  3.5   Hemoglobin 11.1 - 15.9 g/dL 87.8  88.6  88.7   Hematocrit 34.0 - 46.6 % 40.3  37.9  37.3   Platelets 150 - 450 x10E3/uL 76  68   69       Latest Ref Rng & Units 09/01/2023   11:44 AM 06/30/2023    1:12 PM 05/03/2023    8:47 AM  CMP  Glucose 70 - 99 mg/dL 767  648  882   BUN 6 - 24 mg/dL 8  7  12    Creatinine 0.57 - 1.00 mg/dL 9.02  9.05  8.93   Sodium 134 - 144 mmol/L 143  140  140   Potassium 3.5 - 5.2 mmol/L 4.4  4.3  4.6   Chloride 96 - 106 mmol/L 102  101  102   CO2 20 - 29 mmol/L 23  25  21    Calcium  8.7 - 10.2 mg/dL 9.5  89.9  9.3   Total Protein 6.0 - 8.5 g/dL 6.7  6.9  6.4   Total Bilirubin 0.0 - 1.2 mg/dL 0.5  0.5  0.4   Alkaline Phos 44 - 121 IU/L 133  132  138   AST 0 - 40 IU/L 22  19  20    ALT 0 - 32 IU/L 17  16  12      Lab Results  Component Value Date   TIBC 339 03/29/2023   TIBC 312 05/20/2020   TIBC 300 01/13/2020   FERRITIN 20 03/29/2023   FERRITIN 98 05/20/2020   IRONPCTSAT 17 03/29/2023   IRONPCTSAT 26 05/20/2020   IRONPCTSAT 24.3 01/13/2020   STUDIES:       HISTORY:   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 2 diabetes mellitus, with fat layer exposed (HCC) 03/27/2020   Gastritis    Genetic susceptibility to malignant neoplasm of breast    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 No longer has    Past Surgical History:  Procedure Laterality Date   ACHILLES TENDON LENGTHENING Left 04/09/2023   Procedure: LENGTHENING, TENDON, ACHILLES;  Surgeon: Lamount Ethan CROME, DPM;  Location: [facility] OR;  Service: Podiatry;  Laterality: Left;   AMPUTATION TOE Bilateral 03/04/2022   Procedure: AMPUTATION TOE RIGHT FOOT PARTIAL OR TOTAL GREAT TOE AND SECOND TOE, LEFT FOOT PARTIAL OR TOTAL GREAT TOE, SECOND AND THIRD TOE;  Surgeon: Malvin Marsa FALCON, DPM;  Location: [facility] OR;  Service: Podiatry;  Laterality: Bilateral;   AMPUTATION TOE Right 01/04/2023   Procedure: R 1ST TOE AMPUTATION, RIGHT 3RD TOE AMPUTATION;  Surgeon: Malvin Marsa FALCON, DPM;  Location: [facility];  Service: Orthopedics/Podiatry;  Laterality: Right;   APPENDECTOMY     BACK SURGERY     x 3 lower disc   BILATERAL TOTAL MASTECTOMY WITH AXILLARY LYMPH NODE DISSECTION Bilateral 09/2019   CHOLECYSTECTOMY     nephrolithiasis     ROBOTIC ASSISTED TOTAL HYSTERECTOMY WITH BILATERAL SALPINGO OOPHERECTOMY Bilateral 06/14/2016   Procedure: ROBOTIC ASSISTED TOTAL  HYSTERECTOMY WITH BILATERAL SALPINGO OOPHORECTOMY;  Surgeon: Dodie Shadow, MD;  Location: [facility];  Service: Gynecology;  Laterality: Bilateral;   TRANSMETATARSAL AMPUTATION Left 04/09/2023   Procedure: LEFT TRANSMETATARSAL AMPUTATION;  Surgeon: Lamount Ethan CROME, DPM;  Location: [facility] OR;  Service: Podiatry;  Laterality: Left;    Family History  Problem Relation Age of Onset   Breast cancer Mother        breast   CAD Father    Diabetes Father    Heart failure Father    Cancer Father        renal carcinoma   Kidney failure Father    Kidney cancer Father    Kidney cancer Brother 44       renal carcinoma.   Stroke Paternal Grandmother     Social History:  reports that she has  never smoked. She has never used smokeless tobacco. She reports that she does not drink alcohol and does not use drugs.The patient is alone today.  Allergies:  Allergies  Allergen Reactions   Codeine Other (See Comments) and Shortness Of Breath    Other reaction(s): SHOB   Amoxicillin -Pot Clavulanate Diarrhea   Celecoxib Other (See Comments)    Unknown reaction   Ezetimibe-Simvastatin Other (See Comments)    Unknown reaction  Patient is not aware of an allergy to this medication, ask   Propranolol Other (See Comments)    Other reaction(s): Unknown  Other Reaction(s): dizziness, fatigue, hypotension, nightmares   Propranolol Hcl Other (See Comments)    Unknown reaction  ask   Simvastatin Other (See Comments)    Other reaction(s): Unknown   Zetia [Ezetimibe] Other (See Comments)    Other reaction(s): Unknown    Current Medications: Current Outpatient Medications  Medication Sig Dispense Refill   albuterol  (VENTOLIN  HFA) 108 (90 Base) MCG/ACT inhaler INHALE 2 PUFFS INTO THE LUNGS EVERY 6 HOURS AS NEEDED FOR WHEEZING OR SHORTNESS OF BREATH 8.5 g 1   amitriptyline  (ELAVIL ) 25 MG tablet TAKE ONE TABLET BY MOUTH AT BEDTIME 30 tablet 2   atorvastatin  (LIPITOR) 10 MG tablet TAKE ONE TABLET BY MOUTH EVERY EVENING 90 tablet 1   buPROPion  (WELLBUTRIN  XL) 300 MG 24 hr tablet TAKE ONE TABLET BY MOUTH EVERY DAY 90 tablet 1   busPIRone  (BUSPAR ) 10 MG tablet Take 1 tablet (10 mg total) by mouth 3 (three) times daily. 90 tablet 5   cyclobenzaprine  (FLEXERIL ) 10 MG tablet TAKE ONE TABLET BY MOUTH EVERY 8 HOURS AS NEEDED FOR MUSCLE SPASMS 270 tablet 1   dicyclomine  (BENTYL ) 20 MG tablet TAKE ONE TABLET BY MOUTH BEFORE MEALS AND AT BEDTIME AS NEEDED FOR STOMACH CRAMPING 180 tablet 1   Empagliflozin-metFORMIN HCl (SYNJARDY ) 12.05-998 MG TABS Take 1 tablet by mouth 2 (two) times daily. 60 tablet 3   FETZIMA  80 MG CP24 TAKE ONE CAPSULE BY MOUTH EVERY EVENING 90 capsule 1   gabapentin  (NEURONTIN ) 800  MG tablet Take 1 tablet (800 mg total) by mouth 3 (three) times daily. 270 tablet 1   levothyroxine  (SYNTHROID ) 75 MCG tablet TAKE ONE TABLET BY MOUTH EVERY DAY 90 tablet 1   Magnesium  500 MG CAPS Take 1 capsule (500 mg total) by mouth daily. 90 capsule 1   metoprolol  succinate (TOPROL -XL) 25 MG 24 hr tablet TAKE 1/2 TABLET BY MOUTH EVERY DAY 45 tablet 1   Multiple Vitamin (MULTIVITAMIN WITH MINERALS) TABS tablet Take 1 tablet by mouth daily. Solar ray     OVER THE COUNTER MEDICATION Take 1 tablet by mouth  in the morning and at bedtime. Lutein for eyes     polyethylene glycol (MIRALAX  / GLYCOLAX ) 17 g packet Take 17 g by mouth daily as needed for moderate constipation.     potassium chloride  (MICRO-K ) 10 MEQ CR capsule TAKE 2 CAPSULES BY MOUTH EVERY DAY and TAKE 2 CAPSULES BY MOUTH EVERY EVENING 360 capsule 1   Probiotic Product (PROBIOTIC PO) Take 1 capsule by mouth in the morning. 3.2 billion cfu     rOPINIRole  (REQUIP ) 0.25 MG tablet TAKE ONE TABLET BY MOUTH AT BEDTIME 90 tablet 1   Suvorexant  (BELSOMRA ) 10 MG TABS Take 1 tablet (10 mg total) by mouth at bedtime as needed. 30 tablet 5   tirzepatide  (MOUNJARO ) 12.5 MG/0.5ML Pen Inject 12.5 mg into the skin once a week. 6 mL 0   TRESIBA  FLEXTOUCH 200 UNIT/ML FlexTouch Pen INJECT 60 UNITS SUBCUTANEOUSLY DAILY 9 mL 3   Vitamin D , Ergocalciferol , (DRISDOL ) 1.25 MG (50000 UNIT) CAPS capsule TAKE ONE CAPSULE BY MOUTH EVERY FRIDAY AT 8AM 12 capsule 1   VRAYLAR  3 MG capsule TAKE ONE CAPSULE BY MOUTH EVERY DAY 90 capsule 0   Blood Glucose Monitoring Suppl DEVI 1 each by Does not apply route in the morning, at noon, and at bedtime. May substitute to any manufacturer covered by patient's insurance. 1 each 0   Blood Pressure Monitoring (SPHYGMOMANOMETER) MISC 1 each by Does not apply route daily in the afternoon. 1 each 0   clotrimazole -betamethasone  (LOTRISONE ) cream Apply topically 2 (two) times daily.     Continuous Glucose Sensor (FREESTYLE LIBRE 3 PLUS  SENSOR) MISC Change sensor every 15 days. 7 each 3   Glucose Blood (BLOOD GLUCOSE TEST STRIPS) STRP 1 each by In Vitro route in the morning, at noon, and at bedtime. 100 strip 12   Lancet Device MISC 1 each by Does not apply route in the morning, at noon, and at bedtime. 1 each 0   Lancets (ONETOUCH DELICA PLUS LANCET30G) MISC USE TO check blood glucose 2-3 times daily AS DIRECTED 100 each 3   Lancets Misc. MISC 1 each by Does not apply route in the morning, at noon, and at bedtime. 100 each 12   losartan  (COZAAR ) 50 MG tablet TAKE ONE TABLET BY MOUTH EVERY EVENING 90 tablet 0   morphine  (MS CONTIN ) 30 MG 12 hr tablet Take 1 tablet (30 mg total) by mouth every 12 (twelve) hours. 60 tablet 0   mupirocin  ointment (BACTROBAN ) 2 % Apply 1 Application topically 2 (two) times daily. 30 g 2   nystatin  (MYCOSTATIN /NYSTOP ) powder Apply 1 Application topically 3 (three) times daily. 15 g 0   ondansetron  (ZOFRAN ) 4 MG tablet Take 1 tablet (4 mg total) by mouth every 4 (four) hours as needed for nausea. 90 tablet 3   pantoprazole  (PROTONIX ) 40 MG tablet TAKE ONE TABLET BY MOUTH EVERY DAY and TAKE ONE TABLET BY MOUTH EVERY EVENING 180 tablet 0   SURE COMFORT PEN NEEDLES 32G X 4 MM MISC Use new needle with each injection. 100 each 0   VYVANSE  70 MG capsule TAKE ONE CAPSULE BY MOUTH EVERY DAY 30 capsule 0   No current facility-administered medications for this visit.   Facility-Administered Medications Ordered in Other Visits  Medication Dose Route Frequency Provider Last Rate Last Admin   sodium chloride  flush (NS) 0.9 % injection 10 mL  10 mL Intracatheter PRN Mosher, Kelli A, PA-C   10 mL at 02/19/21 1004    I,Jasmine M Lassiter,acting as a  scribe for Wanda VEAR Cornish, MD.,have documented all relevant documentation on the behalf of Wanda VEAR Cornish, MD,as directed by  Wanda VEAR Cornish, MD while in the presence of Wanda VEAR Cornish, MD.

## 2023-10-08 ENCOUNTER — Encounter: Payer: Self-pay | Admitting: Family Medicine

## 2023-10-09 ENCOUNTER — Other Ambulatory Visit: Payer: Self-pay | Admitting: Family Medicine

## 2023-10-09 ENCOUNTER — Other Ambulatory Visit: Payer: Self-pay

## 2023-10-09 DIAGNOSIS — E1140 Type 2 diabetes mellitus with diabetic neuropathy, unspecified: Secondary | ICD-10-CM

## 2023-10-09 DIAGNOSIS — F50819 Binge eating disorder, unspecified: Secondary | ICD-10-CM

## 2023-10-09 DIAGNOSIS — I10 Essential (primary) hypertension: Secondary | ICD-10-CM

## 2023-10-09 DIAGNOSIS — K7460 Unspecified cirrhosis of liver: Secondary | ICD-10-CM

## 2023-10-09 MED ORDER — LANCET DEVICE MISC: 1.0000 | Freq: Three times a day (TID) | 0 refills | Status: AC

## 2023-10-09 MED ORDER — BLOOD GLUCOSE MONITORING SUPPL DEVI: 1.0000 | Freq: Three times a day (TID) | 0 refills | Status: AC

## 2023-10-09 MED ORDER — LANCETS MISC. MISC: 1.0000 | Freq: Three times a day (TID) | 12 refills | Status: AC

## 2023-10-09 MED ORDER — BLOOD GLUCOSE TEST VI STRP: 1.0000 | ORAL_STRIP | Freq: Three times a day (TID) | 12 refills | Status: AC

## 2023-10-09 NOTE — Telephone Encounter (Signed)
Patient request refills

## 2023-10-10 ENCOUNTER — Encounter: Payer: Self-pay | Admitting: Hematology and Oncology

## 2023-10-13 DIAGNOSIS — R252 Cramp and spasm: Secondary | ICD-10-CM | POA: Diagnosis not present

## 2023-10-13 DIAGNOSIS — Z89432 Acquired absence of left foot: Secondary | ICD-10-CM | POA: Diagnosis not present

## 2023-10-13 DIAGNOSIS — M86672 Other chronic osteomyelitis, left ankle and foot: Secondary | ICD-10-CM | POA: Diagnosis not present

## 2023-10-13 DIAGNOSIS — E118 Type 2 diabetes mellitus with unspecified complications: Secondary | ICD-10-CM | POA: Diagnosis not present

## 2023-10-13 DIAGNOSIS — B372 Candidiasis of skin and nail: Secondary | ICD-10-CM | POA: Diagnosis not present

## 2023-10-18 ENCOUNTER — Ambulatory Visit: Payer: PPO

## 2023-10-19 ENCOUNTER — Ambulatory Visit

## 2023-10-24 ENCOUNTER — Other Ambulatory Visit: Payer: Self-pay | Admitting: Family Medicine

## 2023-10-24 ENCOUNTER — Ambulatory Visit (INDEPENDENT_AMBULATORY_CARE_PROVIDER_SITE_OTHER): Admitting: Podiatry

## 2023-10-24 DIAGNOSIS — L84 Corns and callosities: Secondary | ICD-10-CM | POA: Diagnosis not present

## 2023-10-24 DIAGNOSIS — E1142 Type 2 diabetes mellitus with diabetic polyneuropathy: Secondary | ICD-10-CM | POA: Diagnosis not present

## 2023-10-24 DIAGNOSIS — M79675 Pain in left toe(s): Secondary | ICD-10-CM | POA: Diagnosis not present

## 2023-10-24 DIAGNOSIS — M79674 Pain in right toe(s): Secondary | ICD-10-CM | POA: Diagnosis not present

## 2023-10-24 DIAGNOSIS — B351 Tinea unguium: Secondary | ICD-10-CM

## 2023-10-24 DIAGNOSIS — E1140 Type 2 diabetes mellitus with diabetic neuropathy, unspecified: Secondary | ICD-10-CM

## 2023-10-24 DIAGNOSIS — Z89432 Acquired absence of left foot: Secondary | ICD-10-CM

## 2023-10-24 DIAGNOSIS — R234 Changes in skin texture: Secondary | ICD-10-CM

## 2023-10-24 MED ORDER — MUPIROCIN 2 % EX OINT: 1.0000 | TOPICAL_OINTMENT | Freq: Two times a day (BID) | CUTANEOUS | 2 refills | Status: AC

## 2023-10-24 NOTE — Patient Instructions (Signed)
 Look for urea 40% cream or ointment and apply to the thickened dry skin / calluses. This can be bought over the counter, at a pharmacy or online such as [employer].

## 2023-10-24 NOTE — Progress Notes (Unsigned)
  Subjective:  Patient ID: Linda [name], female    DOB: 1970/12/01,  MRN: [id number]  Chief Complaint  Patient presents with   [facility]    [facility], right toe and there is a thick callous build up on left heel.  A1c 8.6 in Aug No anti coag.     53 y.o. female presents with the above complaint.  She comes in for diabetic footcare.  Does have callus buildup plantar medial left heel where she previously has had TMA.  Also requesting assistance with painful thickened nails of the right 4th and 5th toe which he is unable to maintain herself due to mobility issues.  Prior digital amputations first, second, third as well right foot.  Last A1c 8.6.  Still waiting on diabetic shoes, her moles needed to be remade she is hoping to get them in the next couple weeks.  Objective:  Physical Exam: warm, good capillary refill, capillary refill intact nail exam right 4th and 5th toenails thickened, dystrophic, greater than 3 mm thickening, painful on palpation DP pulses palpable, PT pulses palpable, and protective sensation absent Left Foot: Left foot transmetatarsal amputation.  Callus with slight fissure present left plantar medial heel. Right Foot: Prior first, second, third toe amputations.  Decreased hammertoe contractures of 4th and 5th toe.  Pain on palpation of the 4th and 5th nail plates.  On gait, there is increased foot abduction bilaterally and patient does have increased load of left plantar medial heel.  Does not appear to be calcaneal gait.  Assessment:   1. Fissure in skin of left foot   2. Pre-ulcerative calluses   3. History of transmetatarsal amputation of left foot (HCC)   4. Pain due to onychomycosis of toenails of both feet   5. DM type 2 with diabetic peripheral neuropathy (HCC)      Plan:  Patient was evaluated and treated and all questions answered.  # Pre ulcerative callus present left plantar medial heel All symptomatic hyperkeratoses x 1 separate lesions were safely  debrided with a sterile #312 blade to patient's level of comfort without incident. We discussed preventative and palliative care of these lesions including supportive and accommodative shoegear, padding, prefabricated and custom molded accommodative orthoses, use of a pumice stone and lotions/creams daily. - Does have slight fissure formation.  Did apply antibacterial ointment to this today.  Prescription sent for topical mupirocin  to be applied to this area.  Can also use urea  based cream to help soften callus - Continue to offload this area is much as possible until she can get her diabetic shoes - Q7 footcare history of amputation  #Onychomycosis with pain  -Nails palliatively debrided as below. -Educated on self-care  Procedure: Nail Debridement Rationale: Pain Type of Debridement: manual, sharp debridement. Instrumentation: Nail nipper, rotary burr. Number of Nails: 10  Return in about 9 weeks (around 12/26/2023) for Diabetic Foot Care.         Ethan Saddler, DPM [facility]

## 2023-10-25 ENCOUNTER — Encounter: Payer: Self-pay | Admitting: Podiatry

## 2023-11-03 ENCOUNTER — Telehealth: Payer: Self-pay | Admitting: Family Medicine

## 2023-11-03 NOTE — Telephone Encounter (Signed)
 [place] Education officer, community

## 2023-11-06 ENCOUNTER — Other Ambulatory Visit: Payer: Self-pay | Admitting: Family Medicine

## 2023-11-06 DIAGNOSIS — K7581 Nonalcoholic steatohepatitis (NASH): Secondary | ICD-10-CM

## 2023-11-06 DIAGNOSIS — F50819 Binge eating disorder, unspecified: Secondary | ICD-10-CM

## 2023-11-06 DIAGNOSIS — R000 Tachycardia, unspecified: Secondary | ICD-10-CM

## 2023-11-06 DIAGNOSIS — E1140 Type 2 diabetes mellitus with diabetic neuropathy, unspecified: Secondary | ICD-10-CM

## 2023-11-09 ENCOUNTER — Ambulatory Visit (INDEPENDENT_AMBULATORY_CARE_PROVIDER_SITE_OTHER)

## 2023-11-09 VITALS — Ht 62.0 in | Wt 206.0 lb

## 2023-11-09 DIAGNOSIS — Z0000 Encounter for general adult medical examination without abnormal findings: Secondary | ICD-10-CM

## 2023-11-09 NOTE — Progress Notes (Signed)
 Subjective:   Linda [name] is a 53 y.o. who presents for a Medicare Wellness preventive visit.  As a reminder, Annual Wellness Visits don't include a physical exam, and some assessments may be limited, especially if this visit is performed virtually. We may recommend an in-person follow-up visit with your provider if needed.  Visit Complete: Virtual I connected with  Linda [name] on 11/09/23 by a audio enabled telemedicine application and verified that I am speaking with the correct person using two identifiers.  Patient Location: Home  Provider Location: Home Office  I discussed the limitations of evaluation and management by telemedicine. The patient expressed understanding and agreed to proceed.  Vital Signs: Because this visit was a virtual/telehealth visit, some criteria may be missing or patient reported. Any vitals not documented were not able to be obtained and vitals that have been documented are patient reported.  VideoDeclined- This patient declined Librarian, academic. Therefore the visit was completed with audio only.  Persons Participating in Visit: Patient.  AWV Questionnaire: Yes: Patient Medicare AWV questionnaire was completed by the patient on 11/05/23; I have confirmed that all information answered by patient is correct and no changes since this date.  Cardiac Risk Factors include: diabetes mellitus;dyslipidemia;hypertension;sedentary lifestyle     Objective:    Today's Vitals   11/09/23 1054  Weight: 206 lb (93.4 kg)  Height: 5' 2 (1.575 m)   Body mass index is 37.68 kg/m.     11/09/2023   11:02 AM 06/30/2023    1:24 PM 05/18/2023    2:05 PM 04/07/2023    7:35 PM 04/07/2023    3:12 PM 01/07/2023   12:10 PM 01/05/2023   12:06 AM  Advanced Directives  Does Patient Have a Medical Advance Directive? Yes Yes Yes Yes No No No  Type of Advance Directive Living will Healthcare Power of [place];Living will  Healthcare Power of [place];Living will Healthcare Power of [place];Living will     Does patient want to make changes to medical advance directive? No - Patient declined  No - Patient declined Yes (Inpatient - patient defers changing a medical advance directive and declines information at this time)     Copy of Healthcare Power of Attorney in Chart?  No - copy requested Yes - validated most recent copy scanned in chart (See row information) No - copy requested       Current Medications (verified) Outpatient Encounter Medications as of 11/09/2023  Medication Sig   albuterol  (VENTOLIN  HFA) 108 (90 Base) MCG/ACT inhaler INHALE 2 PUFFS INTO THE LUNGS EVERY 6 HOURS AS NEEDED FOR WHEEZING OR SHORTNESS OF BREATH   amitriptyline  (ELAVIL ) 25 MG tablet TAKE ONE TABLET BY MOUTH AT BEDTIME   atorvastatin  (LIPITOR) 10 MG tablet TAKE ONE TABLET BY MOUTH EVERY EVENING   Blood Glucose Monitoring Suppl DEVI 1 each by Does not apply route in the morning, at noon, and at bedtime. May substitute to any manufacturer covered by patient's insurance.   Blood Pressure Monitoring (SPHYGMOMANOMETER) MISC 1 each by Does not apply route daily in the afternoon.   buPROPion  (WELLBUTRIN  XL) 300 MG 24 hr tablet TAKE ONE TABLET BY MOUTH EVERY DAY   busPIRone  (BUSPAR ) 10 MG tablet Take 1 tablet (10 mg total) by mouth 3 (three) times daily.   clotrimazole -betamethasone  (LOTRISONE ) cream Apply topically 2 (two) times daily.   Continuous Glucose Sensor (FREESTYLE LIBRE 3 PLUS SENSOR) MISC Change sensor every 15 days.   cyclobenzaprine  (FLEXERIL ) 10 MG tablet TAKE  ONE TABLET BY MOUTH EVERY 8 HOURS AS NEEDED FOR MUSCLE SPASMS   dicyclomine  (BENTYL ) 20 MG tablet TAKE ONE TABLET BY MOUTH BEFORE MEALS AND AT BEDTIME AS NEEDED FOR STOMACH CRAMPING   FETZIMA  80 MG CP24 TAKE ONE CAPSULE BY MOUTH EVERY EVENING   gabapentin  (NEURONTIN ) 400 MG capsule TAKE 2 CAPSULES BY MOUTH 3 TIMES DAILY   gabapentin  (NEURONTIN ) 800 MG tablet Take 1 tablet  (800 mg total) by mouth 3 (three) times daily.   Glucose Blood (BLOOD GLUCOSE TEST STRIPS) STRP 1 each by In Vitro route in the morning, at noon, and at bedtime.   Lancet Device MISC 1 each by Does not apply route in the morning, at noon, and at bedtime.   Lancets (ONETOUCH DELICA PLUS LANCET30G) MISC USE TO check blood glucose 2-3 times daily AS DIRECTED   Lancets Misc. MISC 1 each by Does not apply route in the morning, at noon, and at bedtime.   levothyroxine  (SYNTHROID ) 75 MCG tablet TAKE ONE TABLET BY MOUTH EVERY DAY   lisdexamfetamine (VYVANSE ) 70 MG capsule TAKE ONE CAPSULE BY MOUTH EVERY DAY   losartan  (COZAAR ) 50 MG tablet TAKE ONE TABLET BY MOUTH EVERY EVENING   Magnesium  500 MG CAPS Take 1 capsule (500 mg total) by mouth daily.   metoprolol  succinate (TOPROL -XL) 25 MG 24 hr tablet TAKE 1/2 TABLET BY MOUTH EVERY DAY   morphine  (MS CONTIN ) 30 MG 12 hr tablet Take 1 tablet (30 mg total) by mouth every 12 (twelve) hours.   MOUNJARO  12.5 MG/0.5ML Pen Inject 12.5 mg into the skin once a week.   Multiple Vitamin (MULTIVITAMIN WITH MINERALS) TABS tablet Take 1 tablet by mouth daily. Solar ray   mupirocin  ointment (BACTROBAN ) 2 % Apply 1 Application topically 2 (two) times daily.   nystatin  (MYCOSTATIN /NYSTOP ) powder Apply 1 Application topically 3 (three) times daily.   ondansetron  (ZOFRAN ) 4 MG tablet Take 1 tablet (4 mg total) by mouth every 4 (four) hours as needed for nausea.   OVER THE COUNTER MEDICATION Take 1 tablet by mouth in the morning and at bedtime. Lutein for eyes   pantoprazole  (PROTONIX ) 40 MG tablet TAKE ONE TABLET BY MOUTH EVERY DAY and TAKE ONE TABLET BY MOUTH EVERY EVENING   polyethylene glycol (MIRALAX  / GLYCOLAX ) 17 g packet Take 17 g by mouth daily as needed for moderate constipation.   potassium chloride  (MICRO-K ) 10 MEQ CR capsule TAKE 2 CAPSULES BY MOUTH EVERY DAY and TAKE 2 CAPSULES BY MOUTH EVERY EVENING   Probiotic Product (PROBIOTIC PO) Take 1 capsule by mouth in  the morning. 3.2 billion cfu   rOPINIRole  (REQUIP ) 0.25 MG tablet TAKE ONE TABLET BY MOUTH AT BEDTIME   SURE COMFORT PEN NEEDLES 32G X 4 MM MISC Use new needle with each injection.   Suvorexant  (BELSOMRA ) 10 MG TABS Take 1 tablet (10 mg total) by mouth at bedtime as needed.   SYNJARDY  12.05-998 MG TABS Take 1 tablet by mouth 2 (two) times daily.   TRESIBA  FLEXTOUCH 200 UNIT/ML FlexTouch Pen INJECT 60 UNITS SUBCUTANEOUSLY DAILY   Vitamin D , Ergocalciferol , (DRISDOL ) 1.25 MG (50000 UNIT) CAPS capsule TAKE ONE CAPSULE BY MOUTH EVERY FRIDAY AT 8AM   VRAYLAR  3 MG capsule TAKE ONE CAPSULE BY MOUTH EVERY DAY   Facility-Administered Encounter Medications as of 11/09/2023  Medication   sodium chloride  flush (NS) 0.9 % injection 10 mL    Allergies (verified) Codeine, Amoxicillin -pot clavulanate, Celecoxib, Ezetimibe-simvastatin, Propranolol, Propranolol hcl, Simvastatin, and Zetia [ezetimibe]   History:  Past Medical History:  Diagnosis Date   Acute postoperative respiratory insufficiency 04/17/2020   AKI (acute kidney injury) 04/17/2020   Allergy    Anemia    Anxiety    Arthritis    Blood dyscrasia    ITP   Blood transfusion without reported diagnosis    During time of taking chemo   BRCA gene mutation positive    Cataract    Very mild   Chronic pain syndrome    Cirrhosis of liver not due to alcohol (HCC)    Complication of anesthesia    Difficulty waking up   Depression    Diabetes mellitus without complication (HCC)    type 2   Diabetic ulcer of toe of left foot associated with type 2 diabetes mellitus, with fat layer exposed (HCC) 03/27/2020   Gastritis    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 No longer has   Past Surgical History:  Procedure Laterality Date   ACHILLES TENDON LENGTHENING Left 04/09/2023   Procedure: LENGTHENING, TENDON, ACHILLES;  Surgeon: Lamount Ethan CROME, DPM;  Location: [facility];  Service: Podiatry;  Laterality: Left;   AMPUTATION TOE Bilateral 03/04/2022   Procedure: AMPUTATION TOE RIGHT FOOT PARTIAL OR TOTAL GREAT TOE AND SECOND TOE, LEFT FOOT PARTIAL OR TOTAL GREAT TOE, SECOND AND THIRD TOE;  Surgeon: Malvin Marsa FALCON, DPM;  Location: [facility];  Service: Podiatry;  Laterality: Bilateral;   AMPUTATION TOE Right 01/04/2023   Procedure: R 1ST TOE AMPUTATION, RIGHT 3RD TOE AMPUTATION;  Surgeon: Malvin Marsa FALCON, DPM;  Location: [facility];  Service: Orthopedics/Podiatry;  Laterality: Right;   APPENDECTOMY     BACK SURGERY     x 3 lower disc   BILATERAL TOTAL MASTECTOMY WITH AXILLARY LYMPH NODE DISSECTION Bilateral 09/2019   BREAST SURGERY  9/21   CHOLECYSTECTOMY     nephrolithiasis     ROBOTIC ASSISTED TOTAL HYSTERECTOMY WITH BILATERAL SALPINGO OOPHERECTOMY Bilateral 06/14/2016   Procedure: ROBOTIC ASSISTED TOTAL HYSTERECTOMY WITH BILATERAL SALPINGO OOPHORECTOMY;  Surgeon: Dodie Shadow, MD;  Location: [facility];  Service: Gynecology;  Laterality: Bilateral;   SPINE SURGERY     3 different ones starting at age 58   TRANSMETATARSAL AMPUTATION Left 04/09/2023   Procedure: LEFT TRANSMETATARSAL AMPUTATION;  Surgeon: Lamount Ethan CROME, DPM;  Location: [facility];  Service: Podiatry;  Laterality: Left;   Family History  Problem Relation Age of Onset   Breast cancer Mother        breast   Anxiety disorder Mother    Cancer Mother     Depression Mother    Early death Mother    CAD Father    Diabetes Father    Heart failure Father    Cancer Father        renal carcinoma   Kidney  failure Father    Kidney cancer Father    Arthritis Father    Hearing loss Father    Heart disease Father    Hypertension Father    Kidney disease Father    Obesity Father    Kidney cancer Brother 37       renal carcinoma.   Cancer Brother    Stroke Paternal Grandmother    Social History   Socioeconomic History   Marital status: Divorced    Spouse name: Not on file   Number of children: Not on file   Years of education: Not on file   Highest education level: Associate degree: academic program  Occupational History   Occupation: disabled  Tobacco Use   Smoking status: Never   Smokeless tobacco: Never  Vaping Use   Vaping status: Never Used  Substance and Sexual Activity   Alcohol use: No   Drug use: No   Sexual activity: Not Currently    Birth control/protection: None  Other Topics Concern   Not on file  Social History Narrative   wears sunscreen, brushes and flosses daily, see's dentist bi-annually, has smoke/carbon monoxide detectors, wears a seatbelt and practices gun safety   Social Drivers of Health   Financial Resource Strain: Medium Risk (11/05/2023)   Overall Financial Resource Strain (CARDIA)    Difficulty of Paying Living Expenses: Somewhat hard  Food Insecurity: No Food Insecurity (11/05/2023)   Hunger Vital Sign    Worried About Running Out of Food in the Last Year: Never true    Ran Out of Food in the Last Year: Never true  Transportation Needs: No Transportation Needs (11/05/2023)   PRAPARE - Administrator, Civil Service (Medical): No    Lack of Transportation (Non-Medical): No  Physical Activity: Inactive (11/05/2023)   Exercise Vital Sign    Days of Exercise per Week: 0 days    Minutes of Exercise per Session: 0 min  Stress: Stress Concern Present (11/05/2023)   [employer] of  Occupational Health - Occupational Stress Questionnaire    Feeling of Stress: Rather much  Social Connections: Moderately Integrated (11/05/2023)   Social Connection and Isolation Panel    Frequency of Communication with Friends and Family: More than three times a week    Frequency of Social Gatherings with Friends and Family: More than three times a week    Attends Religious Services: More than 4 times per year    Active Member of [employer] or Organizations: Yes    Attends Banker Meetings: 1 to 4 times per year    Marital Status: Divorced    Tobacco Counseling Counseling given: Not Answered    Clinical Intake:  Pre-visit preparation completed: Yes  Pain : No/denies pain  Diabetes: Yes CBG done?: No Did pt. bring in CBG monitor from home?: No  Lab Results  Component Value Date   HGBA1C 8.6 (H) 09/01/2023   HGBA1C 8.0 (H) 05/03/2023   HGBA1C 6.7 (H) 12/21/2022     How often do you need to have someone help you when you read instructions, pamphlets, or other written materials from your doctor or pharmacy?: 1 - Never  Interpreter Needed?: No  Information entered by :: Charmaine Bloodgood LPN   Activities of Daily Living     11/05/2023   12:03 PM 04/08/2023   12:00 AM  In your present state of health, do you have any difficulty performing the following activities:  Hearing? 0 0  Vision? 1 0  Difficulty  concentrating or making decisions? 1 0  Walking or climbing stairs? 1   Dressing or bathing? 0   Doing errands, shopping? 1 0  Preparing Food and eating ? N   Using the Toilet? N   In the past six months, have you accidently leaked urine? Y   Do you have problems with loss of bowel control? N   Managing your Medications? N   Managing your Finances? Y   Housekeeping or managing your Housekeeping? Y     Patient Care Team: Sherre Clapper, MD as PCP - General (Internal Medicine) Cornelius Wanda DEL, MD as Consulting Physician (Oncology) Charmayne Molly, MD as  Consulting Physician (Ophthalmology) Lamount Ethan CROME, DPM as Consulting Physician (Podiatry)  I have updated your Care Teams any recent Medical Services you may have received from other providers in the past year.     Assessment:   This is a routine wellness examination for Linda [name].  Hearing/Vision screen Hearing Screening - Comments:: Patient is able to hear conversational tones without difficulty. No issues reported.   Vision Screening - Comments:: up to date with routine eye exams with Dr. Charmayne    Goals Addressed             This Visit's Progress    Prevent falls   Not on track      Depression Screen     11/09/2023   11:00 AM 10/04/2023   10:18 AM 09/01/2023   10:33 AM 05/18/2023    1:32 PM 05/11/2023   12:53 PM 05/03/2023    7:48 AM 01/24/2023    7:30 AM  PHQ 2/9 Scores  PHQ - 2 Score 0 0 2 3 2  0 1  PHQ- 9 Score 7  14 10 9 7 9     Fall Risk     11/05/2023   12:03 PM 09/01/2023   10:33 AM 07/20/2023   10:18 AM 07/06/2023   10:33 AM 06/22/2023    1:45 PM  Fall Risk   Falls in the past year? 1 1 0 0 1  Number falls in past yr: 1 1   0  Comment     no falls since last assessment  Injury with Fall? 0 0   1  Risk for fall due to : Impaired mobility;History of fall(s);Impaired balance/gait History of fall(s);Impaired balance/gait;Impaired mobility  History of fall(s);Impaired balance/gait;Impaired mobility History of fall(s);Impaired balance/gait;Impaired mobility;Orthopedic patient  Follow up Education provided;Falls prevention discussed;Falls evaluation completed Falls prevention discussed       MEDICARE RISK AT HOME:  Medicare Risk at Home Any stairs in or around the home?: (Patient-Rptd) No If so, are there any without handrails?: (Patient-Rptd) No Home free of loose throw rugs in walkways, pet beds, electrical cords, etc?: (Patient-Rptd) Yes Adequate lighting in your home to reduce risk of falls?: (Patient-Rptd) Yes Life alert?: (Patient-Rptd) Yes Use of a cane, walker  or w/c?: (Patient-Rptd) Yes Grab bars in the bathroom?: (Patient-Rptd) Yes Shower chair or bench in shower?: (Patient-Rptd) Yes Elevated toilet seat or a handicapped toilet?: (Patient-Rptd) No  TIMED UP AND GO:  Was the test performed?  No  Cognitive Function: 6CIT completed    07/08/2019    9:43 AM  MMSE - Mini Mental State Exam  Orientation to time 5  Orientation to Place 5  Registration 3  Attention/ Calculation 5  Recall 3  Language- name 2 objects 2  Language- repeat 1  Language- follow 3 step command 3  Language- read & follow direction 1  Write  a sentence 1  Copy design 1  Total score 30        11/09/2023   11:03 AM  6CIT Screen  What Year? 0 points  What month? 0 points  What time? 0 points  Count back from 20 0 points  Months in reverse 0 points  Repeat phrase 0 points  Total Score 0 points    Immunizations Immunization History  Administered Date(s) Administered   Hepatitis A, Adult 07/10/2019   Hepatitis B 07/10/2019, 08/14/2019   Hepatitis B, ADULT 03/11/2020   Influenza Inj Mdck Quad Pf 10/09/2019, 11/16/2020, 10/01/2021   Influenza, Mdck, Trivalent,PF 6+ MOS(egg free) 10/13/2022   Influenza-Unspecified 05/05/2013, 09/14/2018   PFIZER Comirnaty(Gray Top)Covid-19 Tri-Sucrose Vaccine 07/24/2020   PFIZER(Purple Top)SARS-COV-2 Vaccination 04/18/2019, 05/13/2019, 01/24/2020   PNEUMOCOCCAL CONJUGATE-20 04/11/2023   Pfizer Covid-19 Vaccine Bivalent Booster 31yrs & up 11/16/2020   Pneumococcal Polysaccharide-23 04/27/2012, 05/05/2013   Tdap 07/14/2021    Screening Tests Health Maintenance  Topic Date Due   Zoster Vaccines- Shingrix (1 of 2) Never done   OPHTHALMOLOGY EXAM  05/28/2022   FOOT EXAM  01/07/2023   Influenza Vaccine  08/25/2023   COVID-19 Vaccine (6 - 2025-26 season) 09/25/2023   HEMOGLOBIN A1C  03/03/2024   Diabetic kidney evaluation - Urine ACR  08/31/2024   Diabetic kidney evaluation - eGFR measurement  09/19/2024   Medicare Annual  Wellness (AWV)  11/08/2024   Colonoscopy  06/09/2026   DTaP/Tdap/Td (2 - Td or Tdap) 07/15/2031   Pneumococcal Vaccine: 50+ Years  Completed   Hepatitis B Vaccines 19-59 Average Risk  Completed   HPV VACCINES  Aged Out   Meningococcal B Vaccine  Aged Out   Mammogram  Discontinued   Hepatitis C Screening  Discontinued   HIV Screening  Discontinued    Health Maintenance Items Addressed: Vaccines Due: Flu and Shingrix   Additional Screening:  Vision Screening: Recommended annual ophthalmology exams for early detection of glaucoma and other disorders of the eye. Is the patient up to date with their annual eye exam?  Yes  Who is the provider or what is the name of the office in which the patient attends annual eye exams? Dr. Charmayne   Dental Screening: Recommended annual dental exams for proper oral hygiene  Community Resource Referral / Chronic Care Management: CRR required this visit?  No   CCM required this visit?  No currently receiving services    Plan:    I have personally reviewed and noted the following in the patient's chart:   Medical and social history Use of alcohol, tobacco or illicit drugs  Current medications and supplements including opioid prescriptions. Patient is not currently taking opioid prescriptions. Functional ability and status Nutritional status Physical activity Advanced directives List of other physicians Hospitalizations, surgeries, and ER visits in previous 12 months Vitals Screenings to include cognitive, depression, and falls Referrals and appointments  In addition, I have reviewed and discussed with patient certain preventive protocols, quality metrics, and best practice recommendations. A written personalized care plan for preventive services as well as general preventive health recommendations were provided to patient.   Lavelle Pfeiffer [place], [place]   [id number]   After Visit Summary: (MyChart) Due to this being a telephonic visit,  the after visit summary with patients personalized plan was offered to patient via MyChart   Notes: PCP Follow Up Recommendations: Patient is concerned with increasing back and knee pain and would like to discuss options at next office visit

## 2023-11-09 NOTE — Patient Instructions (Signed)
 Linda [name],  Thank you for taking the time for your Medicare Wellness Visit. I appreciate your continued commitment to your health goals. Please review the care plan we discussed, and feel free to reach out if I can assist you further.  Medicare recommends these wellness visits once per year to help you and your care team stay ahead of potential health issues. These visits are designed to focus on prevention, allowing your provider to concentrate on managing your acute and chronic conditions during your regular appointments.  Please note that Annual Wellness Visits do not include a physical exam. Some assessments may be limited, especially if the visit was conducted virtually. If needed, we may recommend a separate in-person follow-up with your provider.  Ongoing Care Seeing your primary care provider every 3 to 6 months helps us  monitor your health and provide consistent, personalized care.   Referrals If a referral was made during today's visit and you haven't received any updates within two weeks, please contact the referred provider directly to check on the status.  Recommended Screenings:  Health Maintenance  Topic Date Due   Zoster (Shingles) Vaccine (1 of 2) Never done   Eye exam for diabetics  05/28/2022   Complete foot exam   01/07/2023   Flu Shot  08/25/2023   COVID-19 Vaccine (6 - 2025-26 season) 09/25/2023   Hemoglobin A1C  03/03/2024   Yearly kidney health urinalysis for diabetes  08/31/2024   Yearly kidney function blood test for diabetes  09/19/2024   Medicare Annual Wellness Visit  11/08/2024   Colon Cancer Screening  06/09/2026   DTaP/Tdap/Td vaccine (2 - Td or Tdap) 07/15/2031   Pneumococcal Vaccine for age over 60  Completed   Hepatitis B Vaccine  Completed   HPV Vaccine  Aged Out   Meningitis B Vaccine  Aged Out   Breast Cancer Screening  Discontinued   Hepatitis C Screening  Discontinued   HIV Screening  Discontinued       11/09/2023   11:02 AM  Advanced  Directives  Does Patient Have a Medical Advance Directive? Yes  Type of Advance Directive Living will  Does patient want to make changes to medical advance directive? No - Patient declined   Advance Care Planning is important because it: Ensures you receive medical care that aligns with your values, goals, and preferences. Provides guidance to your family and loved ones, reducing the emotional burden of decision-making during critical moments.  Information on Advanced Care Planning can be found at [place]  Secretary of [facility] Advance Health Care Directives Advance Health Care Directives ([email or web address]   Vision: Annual vision screenings are recommended for early detection of glaucoma, cataracts, and diabetic retinopathy. These exams can also reveal signs of chronic conditions such as diabetes and high blood pressure.  Dental: Annual dental screenings help detect early signs of oral cancer, gum disease, and other conditions linked to overall health, including heart disease and diabetes.  Please see the attached documents for additional preventive care recommendations.

## 2023-11-12 DIAGNOSIS — B372 Candidiasis of skin and nail: Secondary | ICD-10-CM | POA: Diagnosis not present

## 2023-11-12 DIAGNOSIS — E118 Type 2 diabetes mellitus with unspecified complications: Secondary | ICD-10-CM | POA: Diagnosis not present

## 2023-11-12 DIAGNOSIS — R252 Cramp and spasm: Secondary | ICD-10-CM | POA: Diagnosis not present

## 2023-11-12 DIAGNOSIS — Z89432 Acquired absence of left foot: Secondary | ICD-10-CM | POA: Diagnosis not present

## 2023-11-20 DIAGNOSIS — Z89421 Acquired absence of other right toe(s): Secondary | ICD-10-CM | POA: Diagnosis not present

## 2023-11-20 DIAGNOSIS — Z89432 Acquired absence of left foot: Secondary | ICD-10-CM | POA: Diagnosis not present

## 2023-11-20 DIAGNOSIS — Z89411 Acquired absence of right great toe: Secondary | ICD-10-CM | POA: Diagnosis not present

## 2023-11-20 DIAGNOSIS — E1121 Type 2 diabetes mellitus with diabetic nephropathy: Secondary | ICD-10-CM | POA: Diagnosis not present

## 2023-12-07 ENCOUNTER — Other Ambulatory Visit: Payer: Self-pay | Admitting: Family Medicine

## 2023-12-07 DIAGNOSIS — E1140 Type 2 diabetes mellitus with diabetic neuropathy, unspecified: Secondary | ICD-10-CM

## 2023-12-07 DIAGNOSIS — K7581 Nonalcoholic steatohepatitis (NASH): Secondary | ICD-10-CM

## 2023-12-07 DIAGNOSIS — F50819 Binge eating disorder, unspecified: Secondary | ICD-10-CM

## 2023-12-11 NOTE — Assessment & Plan Note (Addendum)
 A1c elevated at 8.9%. Blood glucose improved to 200s with restarting Synjardy  in the last 2 weeks.  Freestyle Libre underutilized due to inability to keep on.  - Increased Lantus  to 60 units daily. - Recommended Health Team [employer] for nutritional counseling.  Patient to call. - Encouraged consistent glucose monitoring.  Orders:   POCT glycosylated hemoglobin (Hb A1C)   insulin  degludec (TRESIBA  FLEXTOUCH) 200 UNIT/ML FlexTouch Pen; Inject 60 Units into the skin daily.

## 2023-12-11 NOTE — Assessment & Plan Note (Addendum)
 Fairly well-controlled.  LDL is at goal.  HDL is too low and triglycerides are slightly too high No changes to medicines. Taking Atorvastatin  10 mg 1 tablet daily. Continue to work on eating a healthy diet and exercise.  Labs drawn today. - Ordered blood work to reassess lipid levels. Orders:   CBC with Differential/Platelet   Comprehensive metabolic panel with GFR   Lipid panel

## 2023-12-11 NOTE — Assessment & Plan Note (Addendum)
 Thyroid  function not recently checked. - Ordered thyroid  function tests. Orders:   T4, free   TSH

## 2023-12-11 NOTE — Assessment & Plan Note (Addendum)
 Blood pressure well-controlled with current medication regimen. Continue losartan  and Toprol -XL. Dizziness likely due to balance issues, not orthostatic hypotension. - Advised to rise slowly and avoid bending over to prevent dizziness. - Recommended using reachers to avoid bending.

## 2023-12-11 NOTE — Progress Notes (Signed)
 [email or web address]  Subjective:  Patient ID: Linda [name], female    DOB: March 28, 1970  Age: 53 y.o. MRN: [id number]  Chief Complaint  Patient presents with   Medical Management of Chronic Issues    HPI: Discussed the use of AI scribe software for clinical note transcription with the patient, who gave verbal consent to proceed.  History of Present Illness Linda [name] is a 53 year old female with diabetes and chronic pain who presents with dizziness and joint pain.  Hypertension -On losartan  50 mg daily and Toprol -XL 25 mg half pill daily Dizziness and presyncope - Dizziness present for the past couple of months, especially when bending down - Sensation of nearly passing out with positional changes - No chest pain or shortness of breath  Polyarticular joint pain and neurological symptoms - Chronic pain in both hips and right knee - Right knee pain is severe and associated with frequent popping, especially at night - Hips 'grab' with initial movement upon standing - Numbness radiating down the left leg  Hyperglycemia and diabetes management - Blood glucose frequently elevated, ranging from 200s to 300s - Irregular monitoring of blood glucose due to issues with Freestyle Libre sensor adherence - Recent missed doses of Synjardy  for over a month - Binge eating episodes attributed to stress, contributing to hyperglycemia  Chronic pain and degenerative disc disease - Chronic back pain managed with morphine  and amitriptyline  - History of moderate degenerative disc disease  Severe depression and anxiety - Space currently on Fetzima  80 mg nightly Belsomra  10 mg nightly as needed insomnia, Wellbutrin  XL 300 mg daily, Vraylar  3 mg daily, and buspirone  10 mg 3 times daily - Occasional visual hallucinations (seeing things move that are not present) - Nervousness about driving, resulting in limited travel for appointments  Restless legs syndrome - Restless legs managed with Requip   0.25 mg at bedtime  Liver disease - History of liver cirrhosis identified on CT scan in August       11/09/2023   11:00 AM 10/04/2023   10:18 AM 09/01/2023   10:33 AM 05/18/2023    1:32 PM 05/11/2023   12:53 PM  Depression screen PHQ 2/9  Decreased Interest 0 0 1 1 1   Down, Depressed, Hopeless 0 0 1 2 1   PHQ - 2 Score 0 0 2 3 2   Altered sleeping 2  3 2 2   Tired, decreased energy 2  3 2 2   Change in appetite 2  3 1 1   Feeling bad or failure about yourself  0  0 1 1  Trouble concentrating 1  1 1 1   Moving slowly or fidgety/restless 0  2 0 0  Suicidal thoughts 0  0 0 0  PHQ-9 Score 7   14  10  9    Difficult doing work/chores   Somewhat difficult Somewhat difficult      Data saved with a previous flowsheet row definition        11/05/2023   12:03 PM  Fall Risk   Falls in the past year? 1  Number falls in past yr: 1  Injury with Fall? 0  Risk for fall due to : Impaired mobility;History of fall(s);Impaired balance/gait  Follow up Education provided;Falls prevention discussed;Falls evaluation completed    Patient Care Team: Sherre Clapper, MD as PCP - General (Internal Medicine) Cornelius Wanda DEL, MD as Consulting Physician (Oncology) Charmayne Molly, MD as Consulting Physician (Ophthalmology) Lamount Ethan CROME, DPM as Consulting Physician (Podiatry)   Review of Systems  Constitutional:  Negative for chills, diaphoresis, fatigue and fever.  HENT:  Negative for congestion, ear pain and sinus pain.   Respiratory:  Negative for cough and shortness of breath.   Cardiovascular:  Negative for chest pain.  Gastrointestinal:  Negative for abdominal pain, constipation, diarrhea, nausea and vomiting.  Genitourinary:  Negative for dysuria.  Musculoskeletal:  Negative for arthralgias.  Neurological:  Positive for dizziness. Negative for weakness and headaches.  Hematological: Negative.   Psychiatric/Behavioral:  Negative for dysphoric mood. The patient is not nervous/anxious.      Current Outpatient Medications on File Prior to Visit  Medication Sig Dispense Refill   albuterol  (VENTOLIN  HFA) 108 (90 Base) MCG/ACT inhaler INHALE 2 PUFFS INTO THE LUNGS EVERY 6 HOURS AS NEEDED FOR WHEEZING OR SHORTNESS OF BREATH 8.5 g 1   amitriptyline  (ELAVIL ) 25 MG tablet TAKE ONE TABLET BY MOUTH AT BEDTIME 30 tablet 2   atorvastatin  (LIPITOR) 10 MG tablet TAKE ONE TABLET BY MOUTH EVERY EVENING 90 tablet 1   Blood Glucose Monitoring Suppl DEVI 1 each by Does not apply route in the morning, at noon, and at bedtime. May substitute to any manufacturer covered by patient's insurance. 1 each 0   Blood Pressure Monitoring (SPHYGMOMANOMETER) MISC 1 each by Does not apply route daily in the afternoon. 1 each 0   buPROPion  (WELLBUTRIN  XL) 300 MG 24 hr tablet TAKE ONE TABLET BY MOUTH EVERY DAY 90 tablet 1   busPIRone  (BUSPAR ) 10 MG tablet Take 1 tablet (10 mg total) by mouth 3 (three) times daily. 90 tablet 5   clotrimazole -betamethasone  (LOTRISONE ) cream Apply topically 2 (two) times daily.     Continuous Glucose Sensor (FREESTYLE LIBRE 3 PLUS SENSOR) MISC Change sensor every 15 days. 7 each 3   cyclobenzaprine  (FLEXERIL ) 10 MG tablet TAKE ONE TABLET BY MOUTH EVERY 8 HOURS AS NEEDED FOR MUSCLE SPASMS 270 tablet 1   dicyclomine  (BENTYL ) 20 MG tablet TAKE ONE TABLET BY MOUTH BEFORE MEALS AND AT BEDTIME AS NEEDED FOR STOMACH CRAMPING 180 tablet 1   FETZIMA  80 MG CP24 TAKE ONE CAPSULE BY MOUTH EVERY EVENING 90 capsule 1   gabapentin  (NEURONTIN ) 400 MG capsule TAKE 2 CAPSULES BY MOUTH 3 TIMES DAILY 180 capsule 1   gabapentin  (NEURONTIN ) 800 MG tablet Take 1 tablet (800 mg total) by mouth 3 (three) times daily. 270 tablet 1   Glucose Blood (BLOOD GLUCOSE TEST STRIPS) STRP 1 each by In Vitro route in the morning, at noon, and at bedtime. 100 strip 12   Lancets (ONETOUCH DELICA PLUS LANCET30G) MISC USE TO check blood glucose 2-3 times daily AS DIRECTED 100 each 0   levothyroxine  (SYNTHROID ) 75 MCG  tablet TAKE ONE TABLET BY MOUTH EVERY DAY 90 tablet 1   losartan  (COZAAR ) 50 MG tablet TAKE ONE TABLET BY MOUTH EVERY EVENING 90 tablet 0   Magnesium  500 MG CAPS Take 1 capsule (500 mg total) by mouth daily. 90 capsule 1   metoprolol  succinate (TOPROL -XL) 25 MG 24 hr tablet TAKE 1/2 TABLET BY MOUTH EVERY DAY 45 tablet 1   morphine  (MS CONTIN ) 30 MG 12 hr tablet Take 1 tablet (30 mg total) by mouth every 12 (twelve) hours. 60 tablet 0   MOUNJARO  12.5 MG/0.5ML Pen Inject 12.5 mg into the skin once a week. 6 mL 1   Multiple Vitamin (MULTIVITAMIN WITH MINERALS) TABS tablet Take 1 tablet by mouth daily. Solar ray     mupirocin  ointment (BACTROBAN ) 2 % Apply 1 Application topically 2 (two) times daily.  30 g 2   nystatin  (MYCOSTATIN /NYSTOP ) powder Apply 1 Application topically 3 (three) times daily. 15 g 0   ondansetron  (ZOFRAN ) 4 MG tablet Take 1 tablet (4 mg total) by mouth every 4 (four) hours as needed for nausea. 90 tablet 3   OVER THE COUNTER MEDICATION Take 1 tablet by mouth in the morning and at bedtime. Lutein for eyes     pantoprazole  (PROTONIX ) 40 MG tablet TAKE ONE TABLET BY MOUTH EVERY DAY and TAKE ONE TABLET BY MOUTH EVERY EVENING 180 tablet 0   polyethylene glycol (MIRALAX  / GLYCOLAX ) 17 g packet Take 17 g by mouth daily as needed for moderate constipation.     potassium chloride  (MICRO-K ) 10 MEQ CR capsule TAKE 2 CAPSULES BY MOUTH EVERY DAY and TAKE 2 CAPSULES BY MOUTH EVERY EVENING 360 capsule 1   Probiotic Product (PROBIOTIC PO) Take 1 capsule by mouth in the morning. 3.2 billion cfu     rOPINIRole  (REQUIP ) 0.25 MG tablet TAKE ONE TABLET BY MOUTH AT BEDTIME 90 tablet 1   SURE COMFORT PEN NEEDLES 32G X 4 MM MISC Use new needle with each injection. 100 each 0   Suvorexant  (BELSOMRA ) 10 MG TABS Take 1 tablet (10 mg total) by mouth at bedtime as needed. 30 tablet 5   SYNJARDY  12.05-998 MG TABS Take 1 tablet by mouth 2 (two) times daily. 60 tablet 3   Vitamin D , Ergocalciferol , (DRISDOL )  1.25 MG (50000 UNIT) CAPS capsule TAKE ONE CAPSULE BY MOUTH EVERY FRIDAY AT 8AM 12 capsule 1   VRAYLAR  3 MG capsule TAKE ONE CAPSULE BY MOUTH EVERY DAY 90 capsule 0   VYVANSE  70 MG capsule TAKE ONE CAPSULE BY MOUTH EVERY DAY 30 capsule 0   Current Facility-Administered Medications on File Prior to Visit  Medication Dose Route Frequency Provider Last Rate Last Admin   sodium chloride  flush (NS) 0.9 % injection 10 mL  10 mL Intracatheter PRN Mosher, Kelli A, PA-C   10 mL at 02/19/21 1004   Past Medical History:  Diagnosis Date   Acute postoperative respiratory insufficiency 04/17/2020   AKI (acute kidney injury) 04/17/2020   Allergy    Anemia    Anxiety    Arthritis    Blood dyscrasia    ITP   Blood transfusion without reported diagnosis    During time of taking chemo   BRCA gene mutation positive    Cataract    Very mild   Chronic pain syndrome    Cirrhosis of liver not due to alcohol (HCC)    Complication of anesthesia    Difficulty waking up   Depression    Diabetes mellitus without complication (HCC)    type 2   Diabetic ulcer of toe of left foot associated with type 2 diabetes mellitus, with fat layer exposed (HCC) 03/27/2020   Gastritis    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 No longer has   Past Surgical History:  Procedure Laterality Date   ACHILLES  TENDON LENGTHENING Left 04/09/2023   Procedure: LENGTHENING, TENDON, ACHILLES;  Surgeon: Lamount Ethan CROME, DPM;  Location: [facility];  Service: Podiatry;  Laterality: Left;   AMPUTATION TOE Bilateral 03/04/2022   Procedure: AMPUTATION TOE RIGHT FOOT PARTIAL OR TOTAL GREAT TOE AND SECOND TOE, LEFT FOOT PARTIAL OR TOTAL GREAT TOE, SECOND AND THIRD TOE;  Surgeon: Malvin Marsa FALCON, DPM;  Location: [facility];  Service: Podiatry;  Laterality: Bilateral;   AMPUTATION TOE Right 01/04/2023   Procedure: R 1ST TOE AMPUTATION, RIGHT 3RD TOE AMPUTATION;  Surgeon: Malvin Marsa FALCON, DPM;  Location: [facility];  Service: Orthopedics/Podiatry;  Laterality: Right;   APPENDECTOMY     BACK SURGERY     x 3 lower disc   BILATERAL TOTAL MASTECTOMY WITH AXILLARY LYMPH NODE DISSECTION Bilateral 09/2019   BREAST SURGERY  9/21   CHOLECYSTECTOMY     nephrolithiasis     ROBOTIC ASSISTED TOTAL HYSTERECTOMY WITH BILATERAL SALPINGO OOPHERECTOMY Bilateral 06/14/2016   Procedure: ROBOTIC ASSISTED TOTAL HYSTERECTOMY WITH BILATERAL SALPINGO OOPHORECTOMY;  Surgeon: Dodie Shadow, MD;  Location: [facility];  Service: Gynecology;  Laterality: Bilateral;   SPINE SURGERY     3 different ones starting at age 60   TRANSMETATARSAL AMPUTATION Left 04/09/2023   Procedure: LEFT TRANSMETATARSAL AMPUTATION;  Surgeon: Lamount Ethan CROME, DPM;  Location: [facility];  Service: Podiatry;  Laterality: Left;    Family History  Problem Relation Age of Onset   Breast cancer Mother        breast   Anxiety disorder Mother    Cancer Mother    Depression Mother    Early death Mother    CAD Father    Diabetes Father    Heart failure Father    Cancer Father        renal carcinoma   Kidney failure Father    Kidney cancer Father    Arthritis Father    Hearing loss Father    Heart disease Father    Hypertension Father    Kidney disease Father    Obesity Father    Kidney cancer Brother 20       renal carcinoma.   Cancer Brother    Stroke Paternal  Grandmother    Social History   Socioeconomic History   Marital status: Divorced    Spouse name: Not on file   Number of children: Not on file   Years of education: Not on file   Highest education level: Associate degree: academic program  Occupational History   Occupation: disabled  Tobacco Use   Smoking status: Never   Smokeless tobacco: Never  Vaping Use   Vaping status: Never Used  Substance and Sexual Activity   Alcohol use: No   Drug use: No   Sexual activity: Not Currently    Birth control/protection: None  Other Topics Concern   Not on file  Social History Narrative   wears sunscreen, brushes and flosses daily, see's dentist bi-annually, has smoke/carbon monoxide detectors, wears a seatbelt and practices gun safety   Social Drivers of Health   Financial Resource Strain: Medium Risk (11/05/2023)   Overall Financial Resource Strain (CARDIA)    Difficulty of Paying Living Expenses: Somewhat hard  Food Insecurity: No Food Insecurity (11/05/2023)   Hunger Vital Sign    Worried About Running Out of  Food in the Last Year: Never true    Ran Out of Food in the Last Year: Never true  Transportation Needs: No Transportation Needs (11/05/2023)   PRAPARE - Administrator, Civil Service (Medical): No    Lack of Transportation (Non-Medical): No  Physical Activity: Inactive (11/05/2023)   Exercise Vital Sign    Days of Exercise per Week: 0 days    Minutes of Exercise per Session: 0 min  Stress: Stress Concern Present (11/05/2023)   [employer] of Occupational Health - Occupational Stress Questionnaire    Feeling of Stress: Rather much  Social Connections: Moderately Integrated (11/05/2023)   Social Connection and Isolation Panel    Frequency of Communication with Friends and Family: More than three times a week    Frequency of Social Gatherings with Friends and Family: More than three times a week    Attends Religious Services: More than 4 times per year     Active Member of [employer] or Organizations: Yes    Attends Banker Meetings: 1 to 4 times per year    Marital Status: Divorced    Objective:  BP 130/64   Pulse 88   Temp 98 F (36.7 C) (Temporal)   Resp 18   Ht 5' 2 (1.575 m)   Wt 205 lb 3.2 oz (93.1 kg)   LMP 06/13/2009   SpO2 99%   BMI 37.53 kg/m      12/12/2023    7:55 AM 11/09/2023   10:54 AM 10/04/2023   10:15 AM  BP/Weight  Systolic BP 130 -- 129  Diastolic BP 64 -- 75  Wt. (Lbs) 205.2 206 206.4  BMI 37.53 kg/m2 37.68 kg/m2 37.75 kg/m2    Physical Exam Vitals reviewed.  Constitutional:      Appearance: Normal appearance. She is obese.  Neck:     Vascular: No carotid bruit.  Cardiovascular:     Rate and Rhythm: Normal rate and regular rhythm.     Heart sounds: Normal heart sounds.  Pulmonary:     Effort: Pulmonary effort is normal. No respiratory distress.     Breath sounds: Normal breath sounds.  Abdominal:     General: Abdomen is flat. Bowel sounds are normal.     Palpations: Abdomen is soft.     Tenderness: There is no abdominal tenderness.  Musculoskeletal:     Comments: RIGHT KNEE EXAM Tender: positive Patellar apprehension: positive Lateral ligament laxity: negative McMurray's signs: positive Anterior drawer movement/Lachmans: negative Posterior drawer movement: negative  LEFT KNEE EXAM Tender: negative Patellar apprehension: positive. Latera ligament laxity: negative McMurray's signs: positive Anterior drawer movement/Lachmans: negative Posterior drawer movement: negative   HIP: Discomfort with flexion and internal and external rotation of right hip.  Left hip. Unable to flex or extend fully due to sciatica. Positive left SLR>   Neurological:     Mental Status: She is alert and oriented to person, place, and time.  Psychiatric:        Behavior: Behavior normal.     Comments: tearful      Diabetic foot exam was performed with the following findings:   Intact posterior  tibialis and dorsalis pedis pulses DECREASED SENSATION BL AMPUTATIONS TOES.       Lab Results  Component Value Date   WBC 4.8 12/12/2023   HGB 12.5 12/12/2023   HCT 40.1 12/12/2023   PLT 69 (LL) 12/12/2023   GLUCOSE 201 (H) 12/12/2023   CHOL 140 12/12/2023   TRIG 204 (H) 12/12/2023  HDL 37 (L) 12/12/2023   LDLCALC 69 12/12/2023   ALT 12 12/12/2023   AST 16 12/12/2023   NA 140 12/12/2023   K 4.0 12/12/2023   CL 104 12/12/2023   CREATININE 0.96 12/12/2023   BUN 9 12/12/2023   CO2 20 12/12/2023   TSH 0.906 12/12/2023   INR 1.0 03/29/2023   HGBA1C 8.9 12/12/2023    Results for orders placed or performed in visit on 12/12/23  POCT glycosylated hemoglobin (Hb A1C)   Collection Time: 12/12/23  8:21 AM  Result Value Ref Range   Hemoglobin A1C     HbA1c POC (<> result, manual entry) 8.9 4.0 - 5.6 %   HbA1c, POC (prediabetic range)     HbA1c, POC (controlled diabetic range)    CBC with Differential/Platelet   Collection Time: 12/12/23  9:24 AM  Result Value Ref Range   WBC 4.8 3.4 - 10.8 x10E3/uL   RBC 4.69 3.77 - 5.28 x10E6/uL   Hemoglobin 12.5 11.1 - 15.9 g/dL   Hematocrit 59.8 65.9 - 46.6 %   MCV 86 79 - 97 fL   MCH 26.7 26.6 - 33.0 pg   MCHC 31.2 (L) 31.5 - 35.7 g/dL   RDW 85.1 88.2 - 84.5 %   Platelets 69 (LL) 150 - 450 x10E3/uL   Neutrophils 71 Not Estab. %   Lymphs 21 Not Estab. %   Monocytes 6 Not Estab. %   Eos 2 Not Estab. %   Basos 0 Not Estab. %   Neutrophils Absolute 3.4 1.4 - 7.0 x10E3/uL   Lymphocytes Absolute 1.0 0.7 - 3.1 x10E3/uL   Monocytes Absolute 0.3 0.1 - 0.9 x10E3/uL   EOS (ABSOLUTE) 0.1 0.0 - 0.4 x10E3/uL   Basophils Absolute 0.0 0.0 - 0.2 x10E3/uL   Immature Granulocytes 0 Not Estab. %   Immature Grans (Abs) 0.0 0.0 - 0.1 x10E3/uL   Hematology Comments: Note:   Comprehensive metabolic panel with GFR   Collection Time: 12/12/23  9:24 AM  Result Value Ref Range   Glucose 201 (H) 70 - 99 mg/dL   BUN 9 6 - 24 mg/dL   Creatinine, Ser  9.03 0.57 - 1.00 mg/dL   eGFR 70 >40 fO/fpw/8.26   BUN/Creatinine Ratio 9 9 - 23   Sodium 140 134 - 144 mmol/L   Potassium 4.0 3.5 - 5.2 mmol/L   Chloride 104 96 - 106 mmol/L   CO2 20 20 - 29 mmol/L   Calcium  9.0 8.7 - 10.2 mg/dL   Total Protein 6.1 6.0 - 8.5 g/dL   Albumin 3.8 3.8 - 4.9 g/dL   Globulin, Total 2.3 1.5 - 4.5 g/dL   Bilirubin Total 0.5 0.0 - 1.2 mg/dL   Alkaline Phosphatase 117 49 - 135 IU/L   AST 16 0 - 40 IU/L   ALT 12 0 - 32 IU/L  Lipid panel   Collection Time: 12/12/23  9:24 AM  Result Value Ref Range   Cholesterol, Total 140 100 - 199 mg/dL   Triglycerides 795 (H) 0 - 149 mg/dL   HDL 37 (L) >60 mg/dL   VLDL Cholesterol Cal 34 5 - 40 mg/dL   LDL Chol Calc (NIH) 69 0 - 99 mg/dL   Chol/HDL Ratio 3.8 0.0 - 4.4 ratio  T4, free   Collection Time: 12/12/23  9:24 AM  Result Value Ref Range   Free T4 1.43 0.82 - 1.77 ng/dL  TSH   Collection Time: 12/12/23  9:24 AM  Result Value Ref Range  TSH 0.906 0.450 - 4.500 uIU/mL   *Note: Due to a large number of results and/or encounters for the requested time period, some results have not been displayed. A complete set of results can be found in Results Review.  .  Assessment & Plan:   Assessment & Plan Type 2 diabetes mellitus with diabetic neuropathy, with long-term current use of insulin  (HCC) A1c elevated at 8.9%. Blood glucose improved to 200s with restarting Synjardy  in the last 2 weeks.  Freestyle Libre underutilized due to inability to keep on.  - Increased Lantus  to 60 units daily. - Recommended Health Team [employer] for nutritional counseling.  Patient to call. - Encouraged consistent glucose monitoring.  Orders:   POCT glycosylated hemoglobin (Hb A1C)   insulin  degludec (TRESIBA  FLEXTOUCH) 200 UNIT/ML FlexTouch Pen; Inject 60 Units into the skin daily.  Essential hypertension, benign Blood pressure well-controlled with current medication regimen. Continue losartan  and Toprol -XL. Dizziness likely due  to balance issues, not orthostatic hypotension. - Advised to rise slowly and avoid bending over to prevent dizziness. - Recommended using reachers to avoid bending.    Liver cirrhosis secondary to NASH (nonalcoholic steatohepatitis) (HCC) Previous imaging showed liver cirrhosis.    Mixed hyperlipidemia Fairly well-controlled.  LDL is at goal.  HDL is too low and triglycerides are slightly too high No changes to medicines. Taking Atorvastatin  10 mg 1 tablet daily. Continue to work on eating a healthy diet and exercise.  Labs drawn today. - Ordered blood work to reassess lipid levels. Orders:   CBC with Differential/Platelet   Comprehensive metabolic panel with GFR   Lipid panel  Acquired hypothyroidism Thyroid  function not recently checked. - Ordered thyroid  function tests. Orders:   T4, free   TSH  Chronic pain syndrome Due to lumbar back pain, but now with chronic pain in right knee and hips with degenerative changes noted on ct imaging. Needs xrays -Continue morphine  at current dose.  - Referred to orthopedics for evaluation and x-rays of the knees and hips.  Intermittent constipation with infrequent Miralax  use. - Encouraged daily use of Miralax  to prevent constipation.     Primary osteoarthritis of right knee Chronic pain in right knee and hips with degenerative changes noted on imaging. - Referred to orthopedics for evaluation and x-rays of the knees and hips. Orders:   AMB referral to orthopedics  Primary osteoarthritis of both hips Chronic pain in right knee and hips with degenerative changes noted on imaging. - Referred to orthopedics for evaluation and x-rays of the knees and hips. Orders:   AMB referral to orthopedics  GAD (generalized anxiety disorder) - Complicated psychiatric patient with depression, anxiety, and some hallucinations. On numerous medicines. We discussed the importance of psychiatry as I am unsure what else to do for her.  Persistent  symptoms despite current medications. Psychiatric evaluation needed. - Referred to psychiatry for evaluation and management.  I recommend the patient go to the crisis center. - Discussed potential for telepsychiatry services due to transportation limitations.    Encounter for immunization  Orders:   Pfizer Comirnaty Covid-19 Vaccine 45yrs & older   Body mass index is 37.53 kg/m.    Meds ordered this encounter  Medications   insulin  degludec (TRESIBA  FLEXTOUCH) 200 UNIT/ML FlexTouch Pen    Sig: Inject 60 Units into the skin daily.    Dispense:  9 mL    Refill:  3    This prescription was filled on 08/11/2023. Any refills authorized will be placed on file.  Orders Placed This Sport And Exercise Psychologist Vaccine 63yrs & older   CBC with Differential/Platelet   Comprehensive metabolic panel with GFR   Lipid panel   T4, free   TSH   AMB referral to orthopedics   POCT glycosylated hemoglobin (Hb A1C)     I,Marla I Leal-Borjas,acting as a scribe for Abigail Free, MD.,have documented all relevant documentation on the behalf of Abigail Free, MD,as directed by  Abigail Free, MD while in the presence of Abigail Free, MD.   Follow-up: Return in about 6 weeks (around 01/24/2024) for chronic follow up. also needs a 3 month follow up fasting. SABRA  An After Visit Summary was printed and given to the patient.  I attest that I have reviewed this visit and agree with the plan scribed by my staff.   Abigail Free, MD [facility] [phone]

## 2023-12-11 NOTE — Assessment & Plan Note (Addendum)
 Previous imaging showed liver cirrhosis.

## 2023-12-11 NOTE — Assessment & Plan Note (Addendum)
 Due to lumbar back pain, but now with chronic pain in right knee and hips with degenerative changes noted on ct imaging. Needs xrays -Continue morphine  at current dose.  - Referred to orthopedics for evaluation and x-rays of the knees and hips.  Intermittent constipation with infrequent Miralax  use. - Encouraged daily use of Miralax  to prevent constipation.

## 2023-12-12 ENCOUNTER — Ambulatory Visit: Payer: Self-pay | Admitting: Family Medicine

## 2023-12-12 ENCOUNTER — Encounter: Payer: Self-pay | Admitting: Family Medicine

## 2023-12-12 ENCOUNTER — Ambulatory Visit (INDEPENDENT_AMBULATORY_CARE_PROVIDER_SITE_OTHER): Admitting: Family Medicine

## 2023-12-12 VITALS — BP 130/64 | HR 88 | Temp 98.0°F | Resp 18 | Ht 62.0 in | Wt 205.2 lb

## 2023-12-12 DIAGNOSIS — E039 Hypothyroidism, unspecified: Secondary | ICD-10-CM

## 2023-12-12 DIAGNOSIS — M1711 Unilateral primary osteoarthritis, right knee: Secondary | ICD-10-CM

## 2023-12-12 DIAGNOSIS — Z794 Long term (current) use of insulin: Secondary | ICD-10-CM

## 2023-12-12 DIAGNOSIS — K7581 Nonalcoholic steatohepatitis (NASH): Secondary | ICD-10-CM | POA: Diagnosis not present

## 2023-12-12 DIAGNOSIS — E782 Mixed hyperlipidemia: Secondary | ICD-10-CM

## 2023-12-12 DIAGNOSIS — F411 Generalized anxiety disorder: Secondary | ICD-10-CM | POA: Diagnosis not present

## 2023-12-12 DIAGNOSIS — I10 Essential (primary) hypertension: Secondary | ICD-10-CM | POA: Diagnosis not present

## 2023-12-12 DIAGNOSIS — G894 Chronic pain syndrome: Secondary | ICD-10-CM

## 2023-12-12 DIAGNOSIS — M160 Bilateral primary osteoarthritis of hip: Secondary | ICD-10-CM

## 2023-12-12 DIAGNOSIS — K7469 Other cirrhosis of liver: Secondary | ICD-10-CM

## 2023-12-12 DIAGNOSIS — E1140 Type 2 diabetes mellitus with diabetic neuropathy, unspecified: Secondary | ICD-10-CM | POA: Diagnosis not present

## 2023-12-12 DIAGNOSIS — Z23 Encounter for immunization: Secondary | ICD-10-CM | POA: Diagnosis not present

## 2023-12-12 LAB — POCT GLYCOSYLATED HEMOGLOBIN (HGB A1C): HbA1c POC (<> result, manual entry): 8.9 % (ref 4.0–5.6)

## 2023-12-12 MED ORDER — TRESIBA FLEXTOUCH 200 UNIT/ML ~~LOC~~ SOPN: 60.0000 [IU] | PEN_INJECTOR | SUBCUTANEOUS | 3 refills | Status: AC

## 2023-12-12 NOTE — Patient Instructions (Addendum)
  VISIT SUMMARY: During your visit, we discussed your dizziness, joint pain, diabetes management, and other health concerns. We adjusted your medications and provided referrals for further evaluation and support.  YOUR PLAN: TYPE 2 DIABETES MELLITUS WITH DIABETIC NEUROPATHY: Your blood sugar levels have been high, and you have had trouble using your glucose monitor consistently. -Increase Lantus  to 60 units daily. -CALL [facility] for nutritional counseling.   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 -Referred to orthopedics for evaluation and x-rays of the knees and hips.  DIZZINESS AND BALANCE IMPAIRMENT: You have been experiencing dizziness, likely due to balance issues. -Rise slowly and avoid bending over to prevent dizziness. -Use reachers to avoid bending.  DEPRESSION AND ANXIETY: You have persistent symptoms of depression and anxiety despite current medications. -Recommend [facility] [address] [place], [place], [place]  Phone: [phone]   NONALCOHOLIC STEATOHEPATITIS (NASH) WITH CIRRHOSIS: You have a history of liver cirrhosis identified on a previous scan.  ESSENTIAL HYPERTENSION: Your blood pressure is well-controlled with your current medications.  MIXED HYPERLIPIDEMIA: Your triglycerides are elevated. -Ordered blood work to reassess lipid levels.  HYPOTHYROIDISM: Your thyroid  function has not been checked recently. -Ordered thyroid  function tests.  GASTROESOPHAGEAL REFLUX DISEASE (GERD): You are taking Protonix  for GERD.  CONSTIPATION: You have intermittent constipation. -Encouraged daily use of Miralax  to prevent constipation.  RESTLESS LEGS SYNDROME: Your symptoms have improved with Requip  but are not fully resolved.  SLEEP DISTURBANCE: You are taking Belsomra  for sleep management. -Continue Belsomra  for sleep  management.                      Contains text generated by [employer].                                 Contains text generated by [employer].

## 2023-12-13 ENCOUNTER — Telehealth: Payer: Self-pay | Admitting: Family Medicine

## 2023-12-13 DIAGNOSIS — Z89432 Acquired absence of left foot: Secondary | ICD-10-CM | POA: Diagnosis not present

## 2023-12-13 DIAGNOSIS — E118 Type 2 diabetes mellitus with unspecified complications: Secondary | ICD-10-CM | POA: Diagnosis not present

## 2023-12-13 DIAGNOSIS — B372 Candidiasis of skin and nail: Secondary | ICD-10-CM | POA: Diagnosis not present

## 2023-12-13 DIAGNOSIS — M86672 Other chronic osteomyelitis, left ankle and foot: Secondary | ICD-10-CM | POA: Diagnosis not present

## 2023-12-13 DIAGNOSIS — R252 Cramp and spasm: Secondary | ICD-10-CM | POA: Diagnosis not present

## 2023-12-13 LAB — COMPREHENSIVE METABOLIC PANEL WITH GFR
ALT: 12 IU/L (ref 0–32)
AST: 16 IU/L (ref 0–40)
Albumin: 3.8 g/dL (ref 3.8–4.9)
Alkaline Phosphatase: 117 IU/L (ref 49–135)
BUN/Creatinine Ratio: 9 (ref 9–23)
BUN: 9 mg/dL (ref 6–24)
Bilirubin Total: 0.5 mg/dL (ref 0.0–1.2)
CO2: 20 mmol/L (ref 20–29)
Calcium: 9 mg/dL (ref 8.7–10.2)
Chloride: 104 mmol/L (ref 96–106)
Creatinine, Ser: 0.96 mg/dL (ref 0.57–1.00)
Globulin, Total: 2.3 g/dL (ref 1.5–4.5)
Glucose: 201 mg/dL — ABNORMAL HIGH (ref 70–99)
Potassium: 4 mmol/L (ref 3.5–5.2)
Sodium: 140 mmol/L (ref 134–144)
Total Protein: 6.1 g/dL (ref 6.0–8.5)
eGFR: 70 mL/min/1.73 (ref >59)

## 2023-12-13 LAB — CBC WITH DIFFERENTIAL/PLATELET
Basophils Absolute: 0 x10E3/uL (ref 0.0–0.2)
Basos: 0 %
EOS (ABSOLUTE): 0.1 x10E3/uL (ref 0.0–0.4)
Eos: 2 %
Hematocrit: 40.1 % (ref 34.0–46.6)
Hemoglobin: 12.5 g/dL (ref 11.1–15.9)
Immature Grans (Abs): 0 x10E3/uL (ref 0.0–0.1)
Immature Granulocytes: 0 %
Lymphocytes Absolute: 1 x10E3/uL (ref 0.7–3.1)
Lymphs: 21 %
MCH: 26.7 pg (ref 26.6–33.0)
MCHC: 31.2 g/dL — ABNORMAL LOW (ref 31.5–35.7)
MCV: 86 fL (ref 79–97)
Monocytes Absolute: 0.3 x10E3/uL (ref 0.1–0.9)
Monocytes: 6 %
Neutrophils Absolute: 3.4 x10E3/uL (ref 1.4–7.0)
Neutrophils: 71 %
Platelets: 69 x10E3/uL — CL (ref 150–450)
RBC: 4.69 x10E6/uL (ref 3.77–5.28)
RDW: 14.8 % (ref 11.7–15.4)
WBC: 4.8 x10E3/uL (ref 3.4–10.8)

## 2023-12-13 LAB — LIPID PANEL
Chol/HDL Ratio: 3.8 ratio (ref 0.0–4.4)
Cholesterol, Total: 140 mg/dL (ref 100–199)
HDL: 37 mg/dL — ABNORMAL LOW (ref >39)
LDL Chol Calc (NIH): 69 mg/dL (ref 0–99)
Triglycerides: 204 mg/dL — ABNORMAL HIGH (ref 0–149)
VLDL Cholesterol Cal: 34 mg/dL (ref 5–40)

## 2023-12-13 LAB — T4, FREE: Free T4: 1.43 ng/dL (ref 0.82–1.77)

## 2023-12-13 LAB — TSH: TSH: 0.906 u[IU]/mL (ref 0.450–4.500)

## 2023-12-13 NOTE — Telephone Encounter (Signed)
 aware

## 2023-12-13 NOTE — Telephone Encounter (Signed)
 Critical lab value.. Platelet count 69.0

## 2023-12-14 ENCOUNTER — Encounter: Payer: Self-pay | Admitting: Family Medicine

## 2023-12-16 ENCOUNTER — Encounter: Payer: Self-pay | Admitting: Family Medicine

## 2023-12-16 DIAGNOSIS — M1711 Unilateral primary osteoarthritis, right knee: Secondary | ICD-10-CM | POA: Insufficient documentation

## 2023-12-16 DIAGNOSIS — K219 Gastro-esophageal reflux disease without esophagitis: Secondary | ICD-10-CM | POA: Insufficient documentation

## 2023-12-16 DIAGNOSIS — G479 Sleep disorder, unspecified: Secondary | ICD-10-CM | POA: Insufficient documentation

## 2023-12-16 DIAGNOSIS — K5904 Chronic idiopathic constipation: Secondary | ICD-10-CM | POA: Insufficient documentation

## 2023-12-16 DIAGNOSIS — M160 Bilateral primary osteoarthritis of hip: Secondary | ICD-10-CM | POA: Insufficient documentation

## 2023-12-16 NOTE — Assessment & Plan Note (Addendum)
 Chronic pain in right knee and hips with degenerative changes noted on imaging. - Referred to orthopedics for evaluation and x-rays of the knees and hips. Orders:   AMB referral to orthopedics

## 2023-12-16 NOTE — Assessment & Plan Note (Deleted)
On Protonix 40 mg twice daily.

## 2023-12-16 NOTE — Assessment & Plan Note (Deleted)
 Dizziness likely due to balance issues, not orthostatic hypotension. - Advised to rise slowly and avoid bending over to prevent dizziness. - Recommended using reachers to avoid bending.

## 2023-12-16 NOTE — Assessment & Plan Note (Deleted)
 On Belsomra  for sleep management. - Continue Belsomra  for sleep management.

## 2023-12-16 NOTE — Assessment & Plan Note (Deleted)
 Persistent symptoms despite current medications. Psychiatric evaluation needed. - Referred to psychiatry for evaluation and management. - Discussed potential for telepsychiatry services due to transportation limitations.

## 2023-12-16 NOTE — Assessment & Plan Note (Addendum)
-   Complicated psychiatric patient with depression, anxiety, and some hallucinations. On numerous medicines. We discussed the importance of psychiatry as I am unsure what else to do for her.  Persistent symptoms despite current medications. Psychiatric evaluation needed. - Referred to psychiatry for evaluation and management.  I recommend the patient go to the crisis center. - Discussed potential for telepsychiatry services due to transportation limitations.

## 2023-12-16 NOTE — Assessment & Plan Note (Addendum)
  Orders:   [employer] Comirnaty Covid-19 Vaccine 81yrs & older

## 2023-12-16 NOTE — Assessment & Plan Note (Deleted)
 Symptoms improved on Requip  but not fully resolved.

## 2023-12-16 NOTE — Assessment & Plan Note (Deleted)
 Intermittent constipation with infrequent Miralax  use. - Encouraged daily use of Miralax  to prevent constipation.

## 2023-12-17 ENCOUNTER — Encounter: Payer: Self-pay | Admitting: Family Medicine

## 2023-12-22 ENCOUNTER — Other Ambulatory Visit: Payer: Self-pay | Admitting: Family Medicine

## 2023-12-22 DIAGNOSIS — E1140 Type 2 diabetes mellitus with diabetic neuropathy, unspecified: Secondary | ICD-10-CM

## 2023-12-26 ENCOUNTER — Ambulatory Visit: Admitting: Podiatry

## 2024-01-04 ENCOUNTER — Other Ambulatory Visit: Payer: Self-pay | Admitting: Family Medicine

## 2024-01-04 DIAGNOSIS — K7581 Nonalcoholic steatohepatitis (NASH): Secondary | ICD-10-CM

## 2024-01-04 DIAGNOSIS — E1140 Type 2 diabetes mellitus with diabetic neuropathy, unspecified: Secondary | ICD-10-CM

## 2024-01-04 DIAGNOSIS — F50819 Binge eating disorder, unspecified: Secondary | ICD-10-CM

## 2024-01-08 ENCOUNTER — Ambulatory Visit (INDEPENDENT_AMBULATORY_CARE_PROVIDER_SITE_OTHER): Admitting: Podiatry

## 2024-01-08 ENCOUNTER — Encounter: Payer: Self-pay | Admitting: Podiatry

## 2024-01-08 DIAGNOSIS — M79674 Pain in right toe(s): Secondary | ICD-10-CM

## 2024-01-08 DIAGNOSIS — E1142 Type 2 diabetes mellitus with diabetic polyneuropathy: Secondary | ICD-10-CM | POA: Diagnosis not present

## 2024-01-08 DIAGNOSIS — B351 Tinea unguium: Secondary | ICD-10-CM

## 2024-01-08 DIAGNOSIS — Z89432 Acquired absence of left foot: Secondary | ICD-10-CM

## 2024-01-08 DIAGNOSIS — L84 Corns and callosities: Secondary | ICD-10-CM | POA: Diagnosis not present

## 2024-01-08 NOTE — Progress Notes (Signed)
°  Subjective:  Patient ID: Linda [name], female    DOB: 03-06-1970,  MRN: [id number]  Chief Complaint  Patient presents with   [facility]    [facility] with callous on the left heel. A1c 8.6 in Aug No anti coag.     53 y.o. female presents with the above complaint.  She comes in for diabetic footcare.  Also requesting assistance with painful thickened nails of the right 4th and 5th toe which he is unable to maintain herself due to mobility issues.  Prior digital amputations first, second, third as well right foot.  Last A1c 8.6.  She has had modifications made to her diabetic shoes in November, seems to be helping some.  Getting new A1c drawn soon.  Does have painful callus plantar medial left heel.  History of left foot TMA, digital amputations of first, second, third toe right foot.  Objective:  Physical Exam: warm, good capillary refill, capillary refill intact nail exam right 4th and 5th toenails thickened, dystrophic, greater than 3 mm thickening, painful on palpation DP pulses palpable, PT pulses palpable, and protective sensation absent Left Foot: Left foot transmetatarsal amputation.  Hyperkeratotic tissue callus remission present left plantar medial heel Right Foot: Prior first, second, third toe amputations.  Decreased hammertoe contractures of 4th and 5th toe.  Pain on palpation of the 4th and 5th nail plates.  On gait, there is increased foot abduction bilaterally and patient does have increased load of left plantar medial heel.  Does not appear to be calcaneal gait.  Assessment:   1. Pre-ulcerative calluses   2. History of transmetatarsal amputation of left foot (HCC)   3. Pain due to onychomycosis of toenail of right foot   4. DM type 2 with diabetic peripheral neuropathy (HCC)      Plan:  Patient was evaluated and treated and all questions answered.  # Pre ulcerative callus present left plantar medial heel All symptomatic hyperkeratoses were safely debrided with a sterile  #312 blade to patient's level of comfort without incident. We discussed preventative and palliative care of these lesions including supportive and accommodative shoegear, padding, prefabricated and custom molded accommodative orthoses, use of a pumice stone and lotions/creams daily. - Has had diabetic shoes modified, seems to have helped some -Continue use of over-the-counter urea  cream to help manage callus. - Q7 footcare history of amputation  #Onychomycosis with pain  -Nails palliatively debrided as below. -Educated on self-care  Procedure: Nail Debridement Rationale: Pain Type of Debridement: manual, sharp debridement. Instrumentation: Nail nipper, rotary burr. Number of Nails: 2  Patient educated on diabetes. Discussed proper diabetic foot care and discussed risks and complications of disease. Educated patient in depth on reasons to return to the office immediately should he/she discover anything concerning or new on the feet. All questions answered. Discussed proper shoes as well.     Return in about 9 weeks (around 03/11/2024) for Diabetic Foot Care.         Ethan Saddler, DPM [facility] / [facility]

## 2024-01-19 ENCOUNTER — Other Ambulatory Visit: Payer: Self-pay | Admitting: Family Medicine

## 2024-01-19 DIAGNOSIS — E1140 Type 2 diabetes mellitus with diabetic neuropathy, unspecified: Secondary | ICD-10-CM

## 2024-01-29 ENCOUNTER — Ambulatory Visit: Admitting: Family Medicine

## 2024-01-31 ENCOUNTER — Inpatient Hospital Stay

## 2024-01-31 ENCOUNTER — Inpatient Hospital Stay: Admitting: Hematology and Oncology

## 2024-02-01 ENCOUNTER — Other Ambulatory Visit: Payer: Self-pay | Admitting: Family Medicine

## 2024-02-01 DIAGNOSIS — E1140 Type 2 diabetes mellitus with diabetic neuropathy, unspecified: Secondary | ICD-10-CM

## 2024-02-01 DIAGNOSIS — F50819 Binge eating disorder, unspecified: Secondary | ICD-10-CM

## 2024-02-01 DIAGNOSIS — K7469 Other cirrhosis of liver: Secondary | ICD-10-CM

## 2024-02-07 ENCOUNTER — Inpatient Hospital Stay

## 2024-02-07 ENCOUNTER — Inpatient Hospital Stay: Admitting: Hematology and Oncology

## 2024-02-13 ENCOUNTER — Inpatient Hospital Stay: Attending: Hematology and Oncology

## 2024-02-13 ENCOUNTER — Telehealth: Payer: Self-pay | Admitting: Oncology

## 2024-02-13 ENCOUNTER — Inpatient Hospital Stay: Admitting: Hematology and Oncology

## 2024-02-13 ENCOUNTER — Other Ambulatory Visit: Payer: Self-pay

## 2024-02-13 VITALS — BP 133/71 | HR 97 | Temp 98.7°F | Resp 18 | Ht 62.0 in | Wt 203.3 lb

## 2024-02-13 DIAGNOSIS — Z9013 Acquired absence of bilateral breasts and nipples: Secondary | ICD-10-CM | POA: Insufficient documentation

## 2024-02-13 DIAGNOSIS — K7469 Other cirrhosis of liver: Secondary | ICD-10-CM

## 2024-02-13 DIAGNOSIS — K7460 Unspecified cirrhosis of liver: Secondary | ICD-10-CM | POA: Diagnosis not present

## 2024-02-13 DIAGNOSIS — D693 Immune thrombocytopenic purpura: Secondary | ICD-10-CM | POA: Insufficient documentation

## 2024-02-13 DIAGNOSIS — K7581 Nonalcoholic steatohepatitis (NASH): Secondary | ICD-10-CM

## 2024-02-13 DIAGNOSIS — Z1501 Genetic susceptibility to malignant neoplasm of breast: Secondary | ICD-10-CM | POA: Diagnosis not present

## 2024-02-13 DIAGNOSIS — Z853 Personal history of malignant neoplasm of breast: Secondary | ICD-10-CM | POA: Diagnosis present

## 2024-02-13 LAB — CMP (CANCER CENTER ONLY)
ALT: 19 U/L (ref 0–44)
AST: 25 U/L (ref 15–41)
Albumin: 3.6 g/dL (ref 3.5–5.0)
Alkaline Phosphatase: 111 U/L (ref 38–126)
Anion gap: 12 (ref 5–15)
BUN: 10 mg/dL (ref 6–20)
CO2: 26 mmol/L (ref 22–32)
Calcium: 9.3 mg/dL (ref 8.9–10.3)
Chloride: 100 mmol/L (ref 98–111)
Creatinine: 0.94 mg/dL (ref 0.44–1.00)
GFR, Estimated: >60 mL/min
Glucose, Bld: 264 mg/dL — ABNORMAL HIGH (ref 70–99)
Potassium: 4.1 mmol/L (ref 3.5–5.1)
Sodium: 138 mmol/L (ref 135–145)
Total Bilirubin: 0.5 mg/dL (ref 0.0–1.2)
Total Protein: 6.4 g/dL — ABNORMAL LOW (ref 6.5–8.1)

## 2024-02-13 LAB — CBC WITH DIFFERENTIAL (CANCER CENTER ONLY)
Abs Immature Granulocytes: 0.01 K/uL (ref 0.00–0.07)
Basophils Absolute: 0 K/uL (ref 0.0–0.1)
Basophils Relative: 0 %
Eosinophils Absolute: 0.1 K/uL (ref 0.0–0.5)
Eosinophils Relative: 2 %
HCT: 36.8 % (ref 36.0–46.0)
Hemoglobin: 11.3 g/dL — ABNORMAL LOW (ref 12.0–15.0)
Immature Granulocytes: 0 %
Lymphocytes Relative: 22 %
Lymphs Abs: 1.2 K/uL (ref 0.7–4.0)
MCH: 26.5 pg (ref 26.0–34.0)
MCHC: 30.7 g/dL (ref 30.0–36.0)
MCV: 86.2 fL (ref 80.0–100.0)
Monocytes Absolute: 0.3 K/uL (ref 0.1–1.0)
Monocytes Relative: 5 %
Neutro Abs: 3.8 K/uL (ref 1.7–7.7)
Neutrophils Relative %: 71 %
Platelet Count: 71 K/uL — ABNORMAL LOW (ref 150–400)
RBC: 4.27 MIL/uL (ref 3.87–5.11)
RDW: 14.1 % (ref 11.5–15.5)
WBC Count: 5.4 K/uL (ref 4.0–10.5)
nRBC: 0 % (ref 0.0–0.2)

## 2024-02-13 LAB — PROTIME-INR
INR: 1 (ref 0.8–1.2)
Prothrombin Time: 13.8 s (ref 11.4–15.2)

## 2024-02-13 NOTE — Progress Notes (Signed)
 " [facility]  [address] 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 She was treated with bilateral mastectomy due to BRCA1 mutation.  She completed 4 cycles of Adriamycin /cyclophosphamide  chemotherapy and received 8 out of 12 planned weeks of weekly paclitaxel .  She remains without evidence of recurrence.   BRCA1 positive Positive BRCA 1 mutation, which increases her risk for breast and ovarian cancer, as well as elevates her risk for pancreatic cancer.  She underwent hysterectomy/bilateral salpingo-oophorectomy.  The hepatologist recommended biannual screening for hepatocellular carcinoma with ultrasound alternating with cross-sectional imaging to allow screening for pancreatic cancer.  CT abdomen in August, 2025 did not reveal any evidence of malignancy.     Liver cirrhosis secondary to NASH (nonalcoholic steatohepatitis) (HCC) Liver cirrhosis as seen on CT imaging in August 2020.  She was referred to [facility], CRNP, at the [facility] in [place]. Hepatitis panel was negative.  She received the appropriate vaccines.  Ms. Hays recommended every 6 month screening for hepatocellular cancer with ultrasound alternating with cross-sectional imaging to allow for screening for pancreatic cancer.  Most recent imaging with CT abdomen in August 2024 did not reveal any evidence of malignancy.  Right upper quadrant ultrasound in March did not reveal any evidence of malignancy.  AFP was normal. CT abdomen done on 09/20/2023 which revealed liver cirrhosis with mild hepatomegaly and no focal mass or lesion of the liver. She does have mild  splenomegaly.    Idiopathic thrombocytopenic purpura (ITP) (HCC) Thrombocytopenia, which is felt to be due to chronic ITP and liver cirrhosis.  Her platelet count has fluctuated up and down her last platelet count at her last visit in March was 80,000, so in good range.  She continues to have regular blood work at Dr. [name] office. Platelets are low but stable at 76,000 today.   Plan: She had a CT abdomen done on 09/20/2023 which revealed liver cirrhosis with mild hepatomegaly and no focal mass or lesion of the liver. She does have mild splenomegaly. She suffered a fall recently resulting in a large bruise to her chin. She states that she became dizzy and fell. She denies hitting her head. We discussed that she needs to be evaluated if she does experience a severe fall resulting in a blow to the head due to thrombocytopenia. Platelets today are 71. She denies any bleeding. She states Dr. Cornelius spoke to her about having her port removed and she would like to keep it at this time. I will see her back in 4 months with CBC, CMP, PT/INR, AFP, and possible port flush. The patient understands the plans discussed today and is in agreement with them.  She knows to contact our office if she develops concerns prior to her next appointment.  I provided 20 minutes of face-to-face time during this encounter and > 50% was spent counseling as documented under my assessment and plan.   Eleanor Bach, FNP- [facility] - A DEPT OF [facility] [address] [place] [address] Dept: [phone] Dept Fax: [phone]   No orders of the defined types were placed in this encounter.  CHIEF COMPLAINT:  CC: Stage IB triple negative breast cancer  Current Treatment: Surveillance  HISTORY OF PRESENT ILLNESS:   Oncology History  Malignant neoplasm of lower-inner quadrant of right breast of female, estrogen receptor negative (HCC)  09/23/2019 Cancer  Staging   Staging form: Breast, AJCC 8th Edition - Clinical stage from 09/23/2019: Stage IB (cT1c(2), cN0(sn), cM0, G3, ER-, PR-, HER2-) - Signed by Cornelius Wanda DEL, MD on 12/23/2019 Staging comments: Bilateral mastectomies,  BRCA 1 pos.   10/01/2019 Initial Diagnosis   Malignant neoplasm of lower-inner quadrant of right breast of female, estrogen receptor negative (HCC)   11/18/2019 - 04/01/2020 Chemotherapy         Hypokalemia (Resolved)  Dehydration (Resolved)      INTERVAL HISTORY: Martika is here today for repeat clinical assessment for her stage IB triple negative breast cancer and BRCA1 positivity. She also has chronic thrombocytopenia related to liver cirrhosis. Patient states that she feels well but complains of back and hip pain rating 5/10. She had a CT abdomen done on 09/20/2023 which revealed liver cirrhosis with mild hepatomegaly and no focal mass or lesion of the liver. She does have mild splenomegaly. She had labs done on 09/01/2023 which revealed a WBC of 4.9, hemoglobin of 12.1, and low platelet count of 76,000 but stable. Her CMP was fairly normal other than an elevated glucose of 232 and alkaline phosphatase of 133. She will have her port flushed today and I informed her that she can have this removed at anytime. She states that she will think on this for now. I will see her back in 4 months with CBC, CMP, PT/INR, AFP, and possible port flush. She denies fever, chills, night sweats, or other signs of infection. She denies cardiorespiratory and gastrointestinal issues. She  denies pain. Her appetite is increased and she states that she is trying to cut back. Her weight has been stable.   REVIEW OF SYSTEMS:  Review of Systems  Constitutional:  Negative for appetite change, chills, fatigue, fever and unexpected weight change.  HENT:  Negative.  Negative for lump/mass, mouth sores, nosebleeds, sore throat and trouble swallowing.   Eyes: Negative.   Respiratory: Negative.   Negative for chest tightness, cough, hemoptysis, shortness of breath and wheezing.   Cardiovascular: Negative.  Negative for chest pain, leg swelling and palpitations.  Gastrointestinal:  Negative for abdominal distention, abdominal pain, blood in stool, constipation, diarrhea, nausea and vomiting.  Endocrine: Negative.   Genitourinary: Negative.  Negative for difficulty urinating, dysuria, frequency, hematuria and vaginal bleeding.   Musculoskeletal:  Positive for arthralgias, back pain (5/10) and gait problem (uses rolling walker for stability). Negative for flank pain, myalgias and neck pain.  Skin: Negative.  Negative for rash.  Neurological:  Positive for gait problem (uses rolling walker for stability) and numbness (neuropathy hands and feet). Negative for dizziness, extremity weakness, headaches, light-headedness, seizures and speech difficulty.  Hematological:  Negative for adenopathy. Bruises/bleeds easily.  Psychiatric/Behavioral: Negative.  Negative for depression and sleep disturbance. The patient is not nervous/anxious.      VITALS:   Last menstrual period 06/13/2009.  Wt Readings from Last 3 Encounters:  12/12/23 205 lb 3.2 oz (93.1 kg)  11/09/23 206 lb (93.4 kg)  10/04/23 206 lb 6.4 oz (93.6 kg)    There is no height or weight on file to calculate BMI.  Performance status (ECOG): 1 - Symptomatic but completely ambulatory    Physical Exam Vitals and nursing note reviewed.  Constitutional:  General: She is not in acute distress.    Appearance: Normal appearance. She is obese. She is not ill-appearing.  HENT:     Head: Normocephalic and atraumatic.     Mouth/Throat:     Mouth: Mucous membranes are moist.     Pharynx: Oropharynx is clear. No oropharyngeal exudate or posterior oropharyngeal erythema.  Eyes:     General: No scleral icterus.    Extraocular Movements: Extraocular movements intact.     Conjunctiva/sclera: Conjunctivae normal.     Pupils: Pupils are  equal, round, and reactive to light.  Cardiovascular:     Rate and Rhythm: Normal rate and regular rhythm.     Heart sounds: Normal heart sounds. No murmur heard.    No friction rub. No gallop.  Pulmonary:     Effort: Pulmonary effort is normal.     Breath sounds: Normal breath sounds. No wheezing, rhonchi or rales.  Chest:  Breasts:    Right: Absent.     Left: Absent.     Comments: Bilateral mastectomy sites are negative.  Abdominal:     General: There is no distension.     Palpations: Abdomen is soft. There is hepatomegaly and splenomegaly. There is no mass.     Tenderness: There is no abdominal tenderness.     Comments: Liver is mildly enlarged at 3 finger breaths below the right costal margin Spleen is not palpable but there is a fullness in the left upper quadrant  Musculoskeletal:        General: Normal range of motion.     Cervical back: Normal range of motion and neck supple. No tenderness.     Right lower leg: No edema.     Left lower leg: No edema.  Lymphadenopathy:     Cervical: No cervical adenopathy.     Upper Body:     Right upper body: No supraclavicular or axillary adenopathy.     Left upper body: No supraclavicular or axillary adenopathy.  Skin:    General: Skin is warm and dry.     Coloration: Skin is not jaundiced.     Findings: No rash.  Neurological:     Mental Status: She is alert and oriented to person, place, and time.     Cranial Nerves: No cranial nerve deficit.  Psychiatric:        Mood and Affect: Mood normal.        Behavior: Behavior normal.        Thought Content: Thought content normal.     LABS:      Latest Ref Rng & Units 12/12/2023    9:24 AM 09/01/2023   11:44 AM 06/30/2023    1:12 PM  CBC  WBC 3.4 - 10.8 x10E3/uL 4.8  4.9  4.5   Hemoglobin 11.1 - 15.9 g/dL 87.4  87.8  88.6   Hematocrit 34.0 - 46.6 % 40.1  40.3  37.9   Platelets 150 - 450 x10E3/uL 69  76  68       Latest Ref Rng & Units 12/12/2023    9:24 AM 09/01/2023   11:44  AM 06/30/2023    1:12 PM  CMP  Glucose 70 - 99 mg/dL 798  767  648   BUN 6 - 24 mg/dL 9  8  7    Creatinine 0.57 - 1.00 mg/dL 9.03  9.02  9.05   Sodium 134 - 144 mmol/L 140  143  140   Potassium 3.5 - 5.2 mmol/L 4.0  4.4  4.3   Chloride 96 - 106 mmol/L 104  102  101   CO2 20 - 29 mmol/L 20  23  25    Calcium  8.7 - 10.2 mg/dL 9.0  9.5  89.9   Total Protein 6.0 - 8.5 g/dL 6.1  6.7  6.9   Total Bilirubin 0.0 - 1.2 mg/dL 0.5  0.5  0.5   Alkaline Phos 49 - 135 IU/L 117  133  132   AST 0 - 40 IU/L 16  22  19    ALT 0 - 32 IU/L 12  17  16      Lab Results  Component Value Date   TIBC 339 03/29/2023   TIBC 312 05/20/2020   TIBC 300 01/13/2020   FERRITIN 20 03/29/2023   FERRITIN 98 05/20/2020   IRONPCTSAT 17 03/29/2023   IRONPCTSAT 26 05/20/2020   IRONPCTSAT 24.3 01/13/2020   STUDIES:       HISTORY:   Past Medical History:  Diagnosis Date   Acute postoperative respiratory insufficiency 04/17/2020   AKI (acute kidney injury) 04/17/2020   Allergy    Anemia    Anxiety    Arthritis    Blood dyscrasia    ITP   Blood transfusion without reported diagnosis    During time of taking chemo   BRCA gene mutation positive    Cataract    Very mild   Chronic pain syndrome    Cirrhosis of liver not due to alcohol (HCC)    Complication of anesthesia    Difficulty waking up   Depression    Diabetes mellitus without complication (HCC)    type 2   Diabetic ulcer of toe of left foot associated with type 2 diabetes mellitus, with fat layer exposed (HCC) 03/27/2020   Gastritis    Genetic susceptibility to malignant neoplasm of breast    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 No longer has    Past Surgical History:  Procedure Laterality Date   ACHILLES TENDON LENGTHENING Left 04/09/2023   Procedure: LENGTHENING, TENDON, ACHILLES;  Surgeon: Lamount Ethan CROME, DPM;  Location: [facility] OR;  Service: Podiatry;  Laterality: Left;   AMPUTATION TOE Bilateral 03/04/2022   Procedure: AMPUTATION TOE RIGHT FOOT PARTIAL OR TOTAL GREAT TOE AND SECOND TOE, LEFT FOOT PARTIAL OR TOTAL GREAT TOE, SECOND AND THIRD TOE;  Surgeon: Malvin Marsa FALCON, DPM;  Location: [facility] OR;  Service: Podiatry;  Laterality: Bilateral;   AMPUTATION TOE Right 01/04/2023   Procedure: R 1ST TOE AMPUTATION, RIGHT 3RD TOE AMPUTATION;  Surgeon: Malvin Marsa FALCON, DPM;  Location: [facility];  Service: Orthopedics/Podiatry;  Laterality: Right;   APPENDECTOMY     BACK SURGERY     x 3 lower disc   BILATERAL TOTAL MASTECTOMY WITH AXILLARY LYMPH NODE DISSECTION Bilateral 09/2019   BREAST SURGERY  9/21   CHOLECYSTECTOMY  nephrolithiasis     ROBOTIC ASSISTED TOTAL HYSTERECTOMY WITH BILATERAL SALPINGO OOPHERECTOMY Bilateral 06/14/2016   Procedure: ROBOTIC ASSISTED TOTAL HYSTERECTOMY WITH BILATERAL SALPINGO OOPHORECTOMY;  Surgeon: Dodie Shadow, MD;  Location: [facility];  Service: Gynecology;  Laterality: Bilateral;   SPINE SURGERY     3 different ones starting at age 34   TRANSMETATARSAL AMPUTATION Left 04/09/2023   Procedure: LEFT TRANSMETATARSAL AMPUTATION;  Surgeon: Lamount Ethan CROME, DPM;  Location: [facility] OR;  Service: Podiatry;  Laterality: Left;    Family History  Problem Relation Age of Onset   Breast cancer Mother        breast   Anxiety disorder Mother    Cancer Mother    Depression Mother    Early death Mother    CAD Father    Diabetes Father    Heart failure  Father    Cancer Father        renal carcinoma   Kidney failure Father    Kidney cancer Father    Arthritis Father    Hearing loss Father    Heart disease Father    Hypertension Father    Kidney disease Father    Obesity Father    Kidney cancer Brother 15       renal carcinoma.   Cancer Brother    Stroke Paternal Grandmother     Social History:  reports that she has never smoked. She has never used smokeless tobacco. She reports that she does not drink alcohol and does not use drugs.The patient is alone today.  Allergies:  Allergies  Allergen Reactions   Codeine Other (See Comments) and Shortness Of Breath    Other reaction(s): SHOB   Amoxicillin -Pot Clavulanate Diarrhea   Celecoxib Other (See Comments)    Unknown reaction   Ezetimibe-Simvastatin Other (See Comments)    Unknown reaction  Patient is not aware of an allergy to this medication, ask   Propranolol Other (See Comments)    Other reaction(s): Unknown  Other Reaction(s): dizziness, fatigue, hypotension, nightmares   Propranolol Hcl Other (See Comments)    Unknown reaction  ask   Simvastatin Other (See Comments)    Other reaction(s): Unknown   Zetia [Ezetimibe] Other (See Comments)    Other reaction(s): Unknown    Current Medications: Current Outpatient Medications  Medication Sig Dispense Refill   albuterol  (VENTOLIN  HFA) 108 (90 Base) MCG/ACT inhaler INHALE 2 PUFFS INTO THE LUNGS EVERY 6 HOURS AS NEEDED FOR WHEEZING OR SHORTNESS OF BREATH 8.5 g 1   amitriptyline  (ELAVIL ) 25 MG tablet TAKE ONE TABLET BY MOUTH AT BEDTIME 30 tablet 2   atorvastatin  (LIPITOR) 10 MG tablet TAKE ONE TABLET BY MOUTH EVERY EVENING 90 tablet 1   Blood Glucose Monitoring Suppl DEVI 1 each by Does not apply route in the morning, at noon, and at bedtime. May substitute to any manufacturer covered by patient's insurance. 1 each 0   Blood Pressure Monitoring (SPHYGMOMANOMETER) MISC 1 each by Does not apply route daily in the afternoon.  1 each 0   buPROPion  (WELLBUTRIN  XL) 300 MG 24 hr tablet TAKE ONE TABLET BY MOUTH EVERY DAY 90 tablet 1   busPIRone  (BUSPAR ) 10 MG tablet Take 1 tablet (10 mg total) by mouth 3 (three) times daily. 90 tablet 5   clotrimazole -betamethasone  (LOTRISONE ) cream Apply topically 2 (two) times daily.     Continuous Glucose Sensor (FREESTYLE LIBRE 3 PLUS SENSOR) MISC Change sensor every 15 days. 7 each 3   cyclobenzaprine  (FLEXERIL ) 10 MG tablet TAKE  ONE TABLET BY MOUTH EVERY 8 HOURS AS NEEDED FOR MUSCLE SPASMS 270 tablet 1   dicyclomine  (BENTYL ) 20 MG tablet TAKE ONE TABLET BY MOUTH BEFORE MEALS AND AT BEDTIME AS NEEDED FOR STOMACH CRAMPING 180 tablet 1   FETZIMA  80 MG CP24 TAKE ONE CAPSULE BY MOUTH EVERY EVENING 90 capsule 1   gabapentin  (NEURONTIN ) 400 MG capsule TAKE 2 CAPSULES BY MOUTH 3 TIMES DAILY 180 capsule 0   gabapentin  (NEURONTIN ) 800 MG tablet Take 1 tablet (800 mg total) by mouth 3 (three) times daily. 270 tablet 1   Glucose Blood (BLOOD GLUCOSE TEST STRIPS) STRP 1 each by In Vitro route in the morning, at noon, and at bedtime. 100 strip 12   insulin  degludec (TRESIBA  FLEXTOUCH) 200 UNIT/ML FlexTouch Pen Inject 60 Units into the skin daily. 9 mL 3   Lancets (ONETOUCH DELICA PLUS LANCET30G) MISC USE TO check blood glucose 2-3 times daily AS DIRECTED 100 each 0   levothyroxine  (SYNTHROID ) 75 MCG tablet TAKE ONE TABLET BY MOUTH EVERY DAY 90 tablet 1   losartan  (COZAAR ) 50 MG tablet TAKE ONE TABLET BY MOUTH EVERY EVENING 90 tablet 0   Magnesium  500 MG CAPS Take 1 capsule (500 mg total) by mouth daily. 90 capsule 1   metoprolol  succinate (TOPROL -XL) 25 MG 24 hr tablet TAKE 1/2 TABLET BY MOUTH EVERY DAY 45 tablet 1   morphine  (MS CONTIN ) 30 MG 12 hr tablet Take 1 tablet (30 mg total) by mouth every 12 (twelve) hours. 60 tablet 0   MOUNJARO  12.5 MG/0.5ML Pen Inject 12.5 mg into the skin once a week. 6 mL 1   Multiple Vitamin (MULTIVITAMIN WITH MINERALS) TABS tablet Take 1 tablet by mouth daily.  Solar ray     mupirocin  ointment (BACTROBAN ) 2 % Apply 1 Application topically 2 (two) times daily. 30 g 2   nystatin  (MYCOSTATIN /NYSTOP ) powder Apply 1 Application topically 3 (three) times daily. 15 g 0   ondansetron  (ZOFRAN ) 4 MG tablet Take 1 tablet (4 mg total) by mouth every 4 (four) hours as needed for nausea. 90 tablet 3   ONETOUCH VERIO test strip Use as instructed to check FBS daily. E11.40 100 strip 0   OVER THE COUNTER MEDICATION Take 1 tablet by mouth in the morning and at bedtime. Lutein for eyes     pantoprazole  (PROTONIX ) 40 MG tablet TAKE ONE TABLET BY MOUTH EVERY DAY and TAKE ONE TABLET BY MOUTH EVERY EVENING 180 tablet 0   polyethylene glycol (MIRALAX  / GLYCOLAX ) 17 g packet Take 17 g by mouth daily as needed for moderate constipation.     potassium chloride  (MICRO-K ) 10 MEQ CR capsule TAKE 2 CAPSULES BY MOUTH EVERY DAY and TAKE 2 CAPSULES BY MOUTH EVERY EVENING 360 capsule 1   Probiotic Product (PROBIOTIC PO) Take 1 capsule by mouth in the morning. 3.2 billion cfu     rOPINIRole  (REQUIP ) 0.25 MG tablet TAKE ONE TABLET BY MOUTH AT BEDTIME 90 tablet 1   SURE COMFORT PEN NEEDLES 32G X 4 MM MISC Use new needle with each injection. 100 each 0   Suvorexant  (BELSOMRA ) 10 MG TABS Take 1 tablet (10 mg total) by mouth at bedtime as needed. 30 tablet 5   SYNJARDY  12.05-998 MG TABS Take 1 tablet by mouth 2 (two) times daily. 60 tablet 3   Vitamin D , Ergocalciferol , (DRISDOL ) 1.25 MG (50000 UNIT) CAPS capsule TAKE ONE CAPSULE BY MOUTH EVERY FRIDAY AT 8AM 12 capsule 1   VRAYLAR  3 MG capsule TAKE ONE  CAPSULE BY MOUTH EVERY DAY 90 capsule 0   VYVANSE  70 MG capsule TAKE ONE CAPSULE BY MOUTH EVERY DAY 30 capsule 0   No current facility-administered medications for this visit.   Facility-Administered Medications Ordered in Other Visits  Medication Dose Route Frequency Provider Last Rate Last Admin   sodium chloride  flush (NS) 0.9 % injection 10 mL  10 mL Intracatheter PRN Mosher, Kelli A, PA-C    10 mL at 02/19/21 1004    I,Jasmine M Lassiter,acting as a neurosurgeon for [employer], NP.,have documented all relevant documentation on the behalf of Eleanor DELENA Bach, NP,as directed by  Eleanor DELENA Bach, NP while in the presence of Eleanor DELENA Bach, NP.  "

## 2024-02-13 NOTE — Telephone Encounter (Signed)
 Patient has been scheduled for follow-up visit per 02/13/2024 LOS.  Pt given an appt calendar with date and time.

## 2024-02-14 LAB — AFP TUMOR MARKER: AFP, Serum, Tumor Marker: 2.9 ng/mL (ref 0.0–9.2)

## 2024-02-15 ENCOUNTER — Other Ambulatory Visit: Payer: Self-pay | Admitting: Family Medicine

## 2024-02-15 DIAGNOSIS — K7469 Other cirrhosis of liver: Secondary | ICD-10-CM

## 2024-02-15 DIAGNOSIS — E1140 Type 2 diabetes mellitus with diabetic neuropathy, unspecified: Secondary | ICD-10-CM

## 2024-02-17 ENCOUNTER — Other Ambulatory Visit: Payer: Self-pay | Admitting: Family Medicine

## 2024-02-17 DIAGNOSIS — K7581 Nonalcoholic steatohepatitis (NASH): Secondary | ICD-10-CM

## 2024-02-17 DIAGNOSIS — E1140 Type 2 diabetes mellitus with diabetic neuropathy, unspecified: Secondary | ICD-10-CM

## 2024-02-17 DIAGNOSIS — M62838 Other muscle spasm: Secondary | ICD-10-CM

## 2024-02-17 DIAGNOSIS — E039 Hypothyroidism, unspecified: Secondary | ICD-10-CM

## 2024-02-20 ENCOUNTER — Other Ambulatory Visit: Payer: Self-pay | Admitting: Family Medicine

## 2024-02-20 DIAGNOSIS — G2581 Restless legs syndrome: Secondary | ICD-10-CM

## 2024-02-20 DIAGNOSIS — E1140 Type 2 diabetes mellitus with diabetic neuropathy, unspecified: Secondary | ICD-10-CM

## 2024-03-12 ENCOUNTER — Ambulatory Visit: Admitting: Podiatry

## 2024-03-20 ENCOUNTER — Ambulatory Visit: Admitting: Family Medicine

## 2024-06-12 ENCOUNTER — Inpatient Hospital Stay: Admitting: Oncology

## 2024-06-12 ENCOUNTER — Inpatient Hospital Stay

## 2024-11-14 ENCOUNTER — Ambulatory Visit
